# Patient Record
Sex: Male | Born: 1940 | Race: Black or African American | Hispanic: No | Marital: Married | Smoking: Never smoker
Health system: Southern US, Community
[De-identification: ages and names within clinical notes are randomized; demographics above are authoritative.]

## PROBLEM LIST (undated history)

## (undated) DIAGNOSIS — R634 Abnormal weight loss: Secondary | ICD-10-CM

## (undated) DIAGNOSIS — Z972 Presence of dental prosthetic device (complete) (partial): Secondary | ICD-10-CM

## (undated) DIAGNOSIS — E8809 Other disorders of plasma-protein metabolism, not elsewhere classified: Secondary | ICD-10-CM

## (undated) DIAGNOSIS — S37019A Minor contusion of unspecified kidney, initial encounter: Secondary | ICD-10-CM

## (undated) DIAGNOSIS — M1990 Unspecified osteoarthritis, unspecified site: Secondary | ICD-10-CM

## (undated) DIAGNOSIS — J9610 Chronic respiratory failure, unspecified whether with hypoxia or hypercapnia: Secondary | ICD-10-CM

## (undated) DIAGNOSIS — R55 Syncope and collapse: Secondary | ICD-10-CM

## (undated) DIAGNOSIS — I82409 Acute embolism and thrombosis of unspecified deep veins of unspecified lower extremity: Secondary | ICD-10-CM

## (undated) DIAGNOSIS — K6289 Other specified diseases of anus and rectum: Secondary | ICD-10-CM

## (undated) DIAGNOSIS — M179 Osteoarthritis of knee, unspecified: Secondary | ICD-10-CM

## (undated) DIAGNOSIS — E46 Unspecified protein-calorie malnutrition: Secondary | ICD-10-CM

## (undated) DIAGNOSIS — I10 Essential (primary) hypertension: Secondary | ICD-10-CM

## (undated) DIAGNOSIS — J8410 Pulmonary fibrosis, unspecified: Secondary | ICD-10-CM

## (undated) DIAGNOSIS — K219 Gastro-esophageal reflux disease without esophagitis: Secondary | ICD-10-CM

## (undated) DIAGNOSIS — D649 Anemia, unspecified: Secondary | ICD-10-CM

## (undated) DIAGNOSIS — Z9289 Personal history of other medical treatment: Secondary | ICD-10-CM

## (undated) DIAGNOSIS — N186 End stage renal disease: Secondary | ICD-10-CM

## (undated) DIAGNOSIS — I5042 Chronic combined systolic (congestive) and diastolic (congestive) heart failure: Secondary | ICD-10-CM

## (undated) DIAGNOSIS — M3500 Sicca syndrome, unspecified: Secondary | ICD-10-CM

## (undated) DIAGNOSIS — M2550 Pain in unspecified joint: Secondary | ICD-10-CM

## (undated) DIAGNOSIS — J189 Pneumonia, unspecified organism: Secondary | ICD-10-CM

## (undated) DIAGNOSIS — E162 Hypoglycemia, unspecified: Secondary | ICD-10-CM

## (undated) DIAGNOSIS — Z992 Dependence on renal dialysis: Secondary | ICD-10-CM

## (undated) DIAGNOSIS — R079 Chest pain, unspecified: Secondary | ICD-10-CM

## (undated) HISTORY — PX: OTHER SURGICAL HISTORY: SHX169

## (undated) HISTORY — DX: Other disorders of plasma-protein metabolism, not elsewhere classified: E88.09

## (undated) HISTORY — DX: Personal history of other medical treatment: Z92.89

## (undated) HISTORY — DX: Pain in unspecified joint: M25.50

## (undated) HISTORY — DX: Hypoglycemia, unspecified: E16.2

## (undated) HISTORY — DX: Essential (primary) hypertension: I10

## (undated) HISTORY — DX: Chest pain, unspecified: R07.9

## (undated) HISTORY — PX: MULTIPLE TOOTH EXTRACTIONS: SHX2053

## (undated) HISTORY — DX: Osteoarthritis of knee, unspecified: M17.9

## (undated) HISTORY — DX: Sicca syndrome, unspecified: M35.00

---

## 1997-09-13 HISTORY — PX: HEMORROIDECTOMY: SUR656

## 1998-01-09 ENCOUNTER — Other Ambulatory Visit: Admission: RE | Admit: 1998-01-09 | Discharge: 1998-01-09 | Payer: Self-pay | Admitting: General Surgery

## 2008-10-25 ENCOUNTER — Encounter: Admission: RE | Admit: 2008-10-25 | Discharge: 2008-10-25 | Payer: Self-pay | Admitting: Pulmonary Disease

## 2013-01-13 ENCOUNTER — Emergency Department (HOSPITAL_COMMUNITY)
Admission: EM | Admit: 2013-01-13 | Discharge: 2013-01-13 | Disposition: A | Discharge status: Home / Self Care | Payer: Medicare Other | Attending: Emergency Medicine | Admitting: Emergency Medicine

## 2013-01-13 DIAGNOSIS — H539 Unspecified visual disturbance: Secondary | ICD-10-CM | POA: Insufficient documentation

## 2013-01-13 DIAGNOSIS — S0510XA Contusion of eyeball and orbital tissues, unspecified eye, initial encounter: Secondary | ICD-10-CM | POA: Insufficient documentation

## 2013-01-13 DIAGNOSIS — Y9389 Activity, other specified: Secondary | ICD-10-CM | POA: Insufficient documentation

## 2013-01-13 DIAGNOSIS — H5789 Other specified disorders of eye and adnexa: Secondary | ICD-10-CM | POA: Insufficient documentation

## 2013-01-13 DIAGNOSIS — Y9289 Other specified places as the place of occurrence of the external cause: Secondary | ICD-10-CM | POA: Insufficient documentation

## 2013-01-13 DIAGNOSIS — IMO0002 Reserved for concepts with insufficient information to code with codable children: Secondary | ICD-10-CM | POA: Insufficient documentation

## 2013-01-13 DIAGNOSIS — S0591XA Unspecified injury of right eye and orbit, initial encounter: Secondary | ICD-10-CM

## 2013-01-13 MED ORDER — FLUORESCEIN SODIUM 1 MG OP STRP
1.0000 | ORAL_STRIP | Freq: Once | OPHTHALMIC | Status: AC
  Administered 2013-01-13: 1 via OPHTHALMIC
  Filled 2013-01-13: qty 1

## 2013-01-13 MED ORDER — PROPARACAINE HCL 0.5 % OP SOLN
1.0000 [drp] | Freq: Once | OPHTHALMIC | Status: AC
  Administered 2013-01-13: 1 [drp] via OPHTHALMIC
  Filled 2013-01-13: qty 15

## 2013-01-13 MED ORDER — FLUORESCEIN SODIUM 1 MG OP STRP
ORAL_STRIP | OPHTHALMIC | Status: AC
  Filled 2013-01-13: qty 2

## 2013-01-13 MED ORDER — TOBRAMYCIN 0.3 % OP SOLN
1.0000 [drp] | Freq: Once | OPHTHALMIC | Status: AC
  Administered 2013-01-13: 1 [drp] via OPHTHALMIC
  Filled 2013-01-13: qty 5

## 2013-01-13 NOTE — ED Provider Notes (Signed)
History    This chart was scribed for Carlisle Cater, a non-physician practitioner working with [name], MD by Joline Maxcy, ED Scribe. This patient was seen in room TR04C/TR04C and the patient's care was started at 1729.   CSN: [id number]  Arrival date & time 01/13/13  1713   First MD Initiated Contact with Patient 01/13/13 1729      Chief Complaint  Patient presents with  . Eye Problem    (Consider location/radiation/quality/duration/timing/severity/associated sxs/prior treatment) The history is provided by the patient and medical records. No language interpreter was used.    Travis [name] is a 72 y.o. male who presents to the Emergency Department complaining of a right eye injury that is neither alleviated or aggravated by anything and began at 12:30 when he was hit in the lid of the right eye with a rock while mowing the lawn. He denies pain, watering, or discharge of the eye as well as visual disturbances, but reports that he fell to his knees from the initial pain of the impact. He treated the injury at home with Refresh Optive Lubricant Eye Drop without relief. He states that he came to the ED when he noticed that his left pupil was not reacting to light. He reports that he only wears glasses at home when reading.  PCP is [employer].  No significant past medical history.   No significant past surgical history.   No family history on file.  History  Substance Use Topics  . Smoking status: Not on file  . Smokeless tobacco: Not on file  . Alcohol Use: Not on file      Review of Systems  Constitutional: Negative for fever.  HENT: Negative for sore throat and rhinorrhea.   Eyes: Positive for redness and visual disturbance. Negative for photophobia, pain and discharge.       Eye injury  Respiratory: Negative for cough.   Cardiovascular: Negative for chest pain.  Gastrointestinal: Negative for nausea, vomiting, abdominal pain and diarrhea.  Genitourinary:  Negative for dysuria.  Musculoskeletal: Negative for myalgias.  Skin: Negative for rash.  Neurological: Negative for headaches.    Allergies  Review of patient's allergies indicates not on file.  Home Medications   Current Outpatient Rx  Name  Route  Sig  Dispense  Refill  . aspirin 81 MG chewable tablet   Oral   Chew 162 mg by mouth once.           BP 121/67  Pulse 70  Temp(Src) 98.3 F (36.8 C) (Oral)  Resp 20  SpO2 96%  Physical Exam  Nursing note and vitals reviewed. Constitutional: He appears well-developed and well-nourished. No distress.  HENT:  Head: Normocephalic and atraumatic.  Eyes: EOM are normal. Right eye exhibits no discharge. Left eye exhibits no discharge. Scleral icterus is present. Right pupil is reactive. Left pupil is reactive. Pupils are unequal.  Slit lamp exam:      The right eye shows corneal abrasion. The right eye shows no hyphema and no fluorescein uptake.  Diffuse corneal abrasion across the diameter of the right cornea. Right pupil is 5 mm and not reactive. Left pupil is 3 mm and minimally reactive.  Neck: Normal range of motion. Neck supple. No tracheal deviation present.  Cardiovascular: Normal rate, regular rhythm and normal heart sounds.   Pulmonary/Chest: Effort normal and breath sounds normal. No respiratory distress.  Abdominal: Soft. He exhibits no distension. There is no tenderness.  Musculoskeletal: Normal range of motion. He  exhibits no edema.  Neurological: He is alert.  Skin: Skin is warm and dry.  Psychiatric: He has a normal mood and affect. His behavior is normal.    ED Course  Procedures (including critical care time)  DIAGNOSTIC STUDIES: Oxygen Saturation is 96% on room air, adequate by my interpretation.    COORDINATION OF CARE:  17:42- Discussed planned course of treatment with the patient, including a visual acuity screening, who is agreeable at this time.  17:56- Visual Acuity EB Visual Acuity - Bilateral  Near: 20/50 ; R Near: 20/100 ; L Near: 20/50  18:00- Medication Orders- proparacaine (alcaine) 0.5% ophthalmic solution 1 drop- once, fluorescein ophthalmic strip 1 strip- once.  Labs Reviewed - No data to display No results found.   1. Eye injury, right, initial encounter    Patient seen and examined. D/w and examined with Dr. Venora Maples.    Vital signs reviewed and are as follows: Filed Vitals:   01/13/13 1717  BP: 121/67  Pulse: 70  Temp: 98.3 F (36.8 C)  Resp: 20   I spoke with Dr. Baird Cancer of ophthalmology. Reccs tobrex and f/u on Monday. Occult globe laceration/rupture is unlikely given our exam here and sphincter paralysis can be explained by trauma.   Pt informed of discussion and need for follow-up.  He appears well at discharge.   Patient urged to return with worsening symptoms or other concerns. Patient verbalized understanding and agrees with plan.     MDM  Corneal abrasion. Possible early traumatic iritis -- however no pain. Doubt rupture. Consulted ophtho and f/u arranged. Likely paralysis 2/2 trauma. Vision deficit noted however patient feels his vision is at baseline.   I personally performed the services described in this documentation, which was scribed in my presence. The recorded information has been reviewed and is accurate.        Carlisle Cater, PA-C 01/14/13 0020

## 2013-01-13 NOTE — ED Notes (Signed)
Pt dc to home.  Pt sts understanding to dc instructions.  Pt ambulatory to exit without difficulty.

## 2013-01-13 NOTE — ED Notes (Signed)
Pt in c/o injury to right eye, states he was mowing and something hit him in the eye, redness noted, states he also noted at home that his right pupil did not react the same to light as his left and that prompted his visit. Pt also used some eye drops that his wife had in his right eye before he noted the pupil changes. Unsure what medication it was.

## 2013-01-15 NOTE — ED Provider Notes (Signed)
Medical screening examination/treatment/procedure(s) were conducted as a shared visit with non-physician practitioner(s) and myself.  I personally evaluated the patient during the encounter  Patient with large corneal abrasion across his right thigh.  He does also have a nonreactive pupil on the right side.  No Seidel sign is present.  Doubt open globe.  Patient has no pain in his right eye.  No pupil movement with consensual or direct light.  The case was discussed with the ophthalmologist who believes this is a transient paralyzed pupil secondary to trauma.  He agrees with treatment of the corneal abrasion with antibiotics.  He will see the patient followup. in 2 days  [name], MD 01/15/13 [phone]

## 2014-12-17 ENCOUNTER — Inpatient Hospital Stay (HOSPITAL_COMMUNITY)
Admission: EM | Admit: 2014-12-17 | Discharge: 2014-12-26 | DRG: 871 | Disposition: A | Discharge status: Home / Self Care | Payer: Medicare Other | Attending: Internal Medicine | Admitting: Internal Medicine

## 2014-12-17 ENCOUNTER — Other Ambulatory Visit: Payer: Self-pay | Admitting: Family Medicine

## 2014-12-17 ENCOUNTER — Encounter (HOSPITAL_COMMUNITY): Payer: Self-pay

## 2014-12-17 ENCOUNTER — Ambulatory Visit
Admission: RE | Admit: 2014-12-17 | Discharge: 2014-12-17 | Disposition: A | Discharge status: Home / Self Care | Payer: Medicare Other | Source: Ambulatory Visit | Attending: Family Medicine | Admitting: Family Medicine

## 2014-12-17 DIAGNOSIS — D649 Anemia, unspecified: Secondary | ICD-10-CM | POA: Diagnosis present

## 2014-12-17 DIAGNOSIS — R0602 Shortness of breath: Secondary | ICD-10-CM | POA: Diagnosis not present

## 2014-12-17 DIAGNOSIS — J8410 Pulmonary fibrosis, unspecified: Secondary | ICD-10-CM

## 2014-12-17 DIAGNOSIS — R601 Generalized edema: Secondary | ICD-10-CM | POA: Diagnosis present

## 2014-12-17 DIAGNOSIS — M7989 Other specified soft tissue disorders: Secondary | ICD-10-CM | POA: Diagnosis not present

## 2014-12-17 DIAGNOSIS — R509 Fever, unspecified: Secondary | ICD-10-CM | POA: Diagnosis present

## 2014-12-17 DIAGNOSIS — M2550 Pain in unspecified joint: Secondary | ICD-10-CM | POA: Diagnosis not present

## 2014-12-17 DIAGNOSIS — R05 Cough: Secondary | ICD-10-CM

## 2014-12-17 DIAGNOSIS — R600 Localized edema: Secondary | ICD-10-CM

## 2014-12-17 DIAGNOSIS — A419 Sepsis, unspecified organism: Principal | ICD-10-CM | POA: Diagnosis present

## 2014-12-17 DIAGNOSIS — M25512 Pain in left shoulder: Secondary | ICD-10-CM | POA: Diagnosis present

## 2014-12-17 DIAGNOSIS — E871 Hypo-osmolality and hyponatremia: Secondary | ICD-10-CM | POA: Diagnosis not present

## 2014-12-17 DIAGNOSIS — D509 Iron deficiency anemia, unspecified: Secondary | ICD-10-CM | POA: Diagnosis present

## 2014-12-17 DIAGNOSIS — E538 Deficiency of other specified B group vitamins: Secondary | ICD-10-CM | POA: Diagnosis present

## 2014-12-17 DIAGNOSIS — R059 Cough, unspecified: Secondary | ICD-10-CM

## 2014-12-17 DIAGNOSIS — R609 Edema, unspecified: Secondary | ICD-10-CM

## 2014-12-17 DIAGNOSIS — R52 Pain, unspecified: Secondary | ICD-10-CM

## 2014-12-17 DIAGNOSIS — M25561 Pain in right knee: Secondary | ICD-10-CM | POA: Diagnosis not present

## 2014-12-17 DIAGNOSIS — R030 Elevated blood-pressure reading, without diagnosis of hypertension: Secondary | ICD-10-CM

## 2014-12-17 DIAGNOSIS — IMO0001 Reserved for inherently not codable concepts without codable children: Secondary | ICD-10-CM | POA: Diagnosis present

## 2014-12-17 DIAGNOSIS — M069 Rheumatoid arthritis, unspecified: Secondary | ICD-10-CM | POA: Diagnosis present

## 2014-12-17 DIAGNOSIS — M170 Bilateral primary osteoarthritis of knee: Secondary | ICD-10-CM | POA: Diagnosis present

## 2014-12-17 DIAGNOSIS — M25519 Pain in unspecified shoulder: Secondary | ICD-10-CM | POA: Insufficient documentation

## 2014-12-17 DIAGNOSIS — R0600 Dyspnea, unspecified: Secondary | ICD-10-CM | POA: Diagnosis not present

## 2014-12-17 DIAGNOSIS — E8770 Fluid overload, unspecified: Secondary | ICD-10-CM | POA: Diagnosis present

## 2014-12-17 DIAGNOSIS — M791 Myalgia: Secondary | ICD-10-CM | POA: Diagnosis present

## 2014-12-17 DIAGNOSIS — J189 Pneumonia, unspecified organism: Secondary | ICD-10-CM | POA: Diagnosis present

## 2014-12-17 DIAGNOSIS — M25511 Pain in right shoulder: Secondary | ICD-10-CM | POA: Diagnosis present

## 2014-12-17 DIAGNOSIS — R0902 Hypoxemia: Secondary | ICD-10-CM | POA: Diagnosis present

## 2014-12-17 DIAGNOSIS — J90 Pleural effusion, not elsewhere classified: Secondary | ICD-10-CM | POA: Diagnosis not present

## 2014-12-17 DIAGNOSIS — R5381 Other malaise: Secondary | ICD-10-CM | POA: Diagnosis not present

## 2014-12-17 DIAGNOSIS — M25569 Pain in unspecified knee: Secondary | ICD-10-CM | POA: Insufficient documentation

## 2014-12-17 HISTORY — DX: Unspecified osteoarthritis, unspecified site: M19.90

## 2014-12-17 HISTORY — DX: Pneumonia, unspecified organism: J18.9

## 2014-12-17 HISTORY — DX: Gastro-esophageal reflux disease without esophagitis: K21.9

## 2014-12-17 LAB — CBC WITH DIFFERENTIAL/PLATELET
BASOS PCT: 0 % (ref 0–1)
Basophils Absolute: 0 10*3/uL (ref 0.0–0.1)
EOS PCT: 1 % (ref 0–5)
Eosinophils Absolute: 0.1 10*3/uL (ref 0.0–0.7)
HEMATOCRIT: 38.8 % — AB (ref 39.0–52.0)
HEMOGLOBIN: 12.7 g/dL — AB (ref 13.0–17.0)
LYMPHS ABS: 2.1 10*3/uL (ref 0.7–4.0)
LYMPHS PCT: 14 % (ref 12–46)
MCH: 29.8 pg (ref 26.0–34.0)
MCHC: 32.7 g/dL (ref 30.0–36.0)
MCV: 91.1 fL (ref 78.0–100.0)
MONOS PCT: 17 % — AB (ref 3–12)
Monocytes Absolute: 2.5 10*3/uL — ABNORMAL HIGH (ref 0.1–1.0)
NEUTROS ABS: 10.2 10*3/uL — AB (ref 1.7–7.7)
Neutrophils Relative %: 68 % (ref 43–77)
Platelets: 317 10*3/uL (ref 150–400)
RBC: 4.26 MIL/uL (ref 4.22–5.81)
RDW: 14 % (ref 11.5–15.5)
WBC: 14.9 10*3/uL — AB (ref 4.0–10.5)

## 2014-12-17 LAB — COMPREHENSIVE METABOLIC PANEL
ALBUMIN: 2.9 g/dL — AB (ref 3.5–5.2)
ALT: 11 U/L (ref 0–53)
ANION GAP: 8 (ref 5–15)
AST: 23 U/L (ref 0–37)
Alkaline Phosphatase: 66 U/L (ref 39–117)
BILIRUBIN TOTAL: 0.9 mg/dL (ref 0.3–1.2)
BUN: 9 mg/dL (ref 6–23)
CALCIUM: 8.7 mg/dL (ref 8.4–10.5)
CO2: 25 mmol/L (ref 19–32)
CREATININE: 0.97 mg/dL (ref 0.50–1.35)
Chloride: 101 mmol/L (ref 96–112)
GFR calc Af Amer: >90 mL/min (ref >90)
GFR calc non Af Amer: 80 mL/min — ABNORMAL LOW (ref >90)
GLUCOSE: 113 mg/dL — AB (ref 70–99)
Potassium: 4.6 mmol/L (ref 3.5–5.1)
Sodium: 134 mmol/L — ABNORMAL LOW (ref 135–145)
TOTAL PROTEIN: 7.3 g/dL (ref 6.0–8.3)

## 2014-12-17 LAB — I-STAT CG4 LACTIC ACID, ED: Lactic Acid, Venous: 1.35 mmol/L (ref 0.5–2.0)

## 2014-12-17 LAB — BRAIN NATRIURETIC PEPTIDE: B Natriuretic Peptide: 38.6 pg/mL (ref 0.0–100.0)

## 2014-12-17 MED ORDER — DEXTROSE 5 % IV SOLN
1.0000 g | Freq: Once | INTRAVENOUS | Status: AC
  Administered 2014-12-17: 1 g via INTRAVENOUS
  Filled 2014-12-17: qty 10

## 2014-12-17 MED ORDER — ACETAMINOPHEN 325 MG PO TABS
650.0000 mg | ORAL_TABLET | Freq: Four times a day (QID) | ORAL | Status: DC | PRN
  Administered 2014-12-17: 650 mg via ORAL
  Filled 2014-12-17: qty 2

## 2014-12-17 MED ORDER — AZITHROMYCIN 250 MG PO TABS
500.0000 mg | ORAL_TABLET | Freq: Once | ORAL | Status: AC
  Administered 2014-12-17: 500 mg via ORAL
  Filled 2014-12-17: qty 2

## 2014-12-17 NOTE — ED Provider Notes (Signed)
CSN: [id number]     Arrival date & time 12/17/14  1820 History   First MD Initiated Contact with Patient 12/17/14 2015     Chief Complaint  Patient presents with  . Cough  . Leg Swelling  . Shortness of Breath     (Consider location/radiation/quality/duration/timing/severity/associated sxs/prior Treatment) HPI Comments: Mr. Belmont is a 74 year old relatively healthy male.  He takes no medications on a chronic basis except one baby aspirin daily.  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 Today, presents to the emergency room with a five-week history of fatigue, joint pain, hand and lower leg swelling and cough for the past 5 weeks, he developed shortness of breath approximately one week ago.  He was seen at his primary care physician as well as the [place] he's been treated with Robitussin cough medication.  Loratadine 10 mg daily for nasal congestion without any resolution of his symptoms.  It was presumed by his primary care physician approximately 3 weeks ago that he had flu due to the fever and joint pain but did not receive a definitive test, nor was given prescription for Tamiflu. He was sent for chest x-ray by his primary care physician today which showed that he has a bibasilar pneumonia and sent to the emergency department for definitive treatment  Patient is a 74 y.o. male presenting with cough and shortness of breath. The history is provided by the patient.  Cough Cough characteristics:  Non-productive Severity:  Moderate Onset quality:  Gradual Duration:  5 weeks Timing:  Intermittent Progression:  Unchanged Chronicity:  New Smoker: no   Relieved by:  Nothing Worsened by:  Activity Ineffective treatments:  Cough suppressants Associated symptoms: fever, myalgias, shortness of breath and sore throat   Associated symptoms: no chest pain, no chills, no headaches, no rash, no rhinorrhea and no wheezing   Shortness of breath:    Severity:  Moderate    Onset quality:  Gradual   Duration:  5 weeks   Timing:  Constant   Progression:  Worsening Sore throat:    Severity:  Moderate   Onset quality:  Gradual   Duration:  5 weeks   Timing:  Intermittent   Progression:  Unchanged Shortness of Breath Associated symptoms: cough, fever and sore throat   Associated symptoms: no abdominal pain, no chest pain, no headaches, no rash, no vomiting and no wheezing     Past Medical History  Diagnosis Date  . Medical history non-contributory   . Arthritis   . Pneumonia   . GERD (gastroesophageal reflux disease)    Past Surgical History  Procedure Laterality Date  . Hemorroidectomy  1999  . Surgical procedure to remove a mole as a child Right Eye area    At around 43 years old   Family History  Problem Relation Age of Onset  . Hypertension Mother    History  Substance Use Topics  . Smoking status: Never Smoker   . Smokeless tobacco: Not on file  . Alcohol Use: No    Review of Systems  Constitutional: Positive for fever. Negative for chills.  HENT: Positive for sore throat. Negative for rhinorrhea and trouble swallowing.   Respiratory: Positive for cough and shortness of breath. Negative for wheezing and stridor.   Cardiovascular: Positive for leg swelling. Negative for chest pain and palpitations.  Gastrointestinal: Negative for nausea, vomiting and abdominal pain.  Genitourinary: Negative for dysuria.  Musculoskeletal: Positive for myalgias and joint swelling.  Skin: Negative  for rash and wound.  Neurological: Negative for headaches.  All other systems reviewed and are negative.     Allergies  Review of patient's allergies indicates no known allergies.  Home Medications   Prior to Admission medications   Medication Sig Start Date End Date Taking? Authorizing Provider  loratadine (CLARITIN) 10 MG tablet Take 10 mg by mouth daily.   Yes Historical Provider, MD   BP 138/65 mmHg  Pulse 96  Temp(Src) 99.1 F (37.3 C)  (Oral)  Resp 18  Ht 5\' 10"  (1.778 m)  Wt 229 lb (103.874 kg)  BMI 32.86 kg/m2  SpO2 97% Physical Exam  Constitutional: He is oriented to person, place, and time. He appears well-developed and well-nourished.  HENT:  Head: Normocephalic.  Right Ear: External ear normal.  Left Ear: External ear normal.  Mouth/Throat: Oropharynx is clear and moist.  Wording of tongue appears dry.  Patient states this is normal  Eyes: Pupils are equal, round, and reactive to light.  Neck: Normal range of motion.  Cardiovascular: Normal rate and regular rhythm.   Pulmonary/Chest: Effort normal and breath sounds normal. No respiratory distress. He has no wheezes. He has no rales. He exhibits no tenderness.  Abdominal: Soft. He exhibits no distension. There is no tenderness.  Musculoskeletal: He exhibits edema and tenderness.  Team of hands, left greater than right to just past the wrist bilaterally.  He also has edema to lower extremities to the knee  Lymphadenopathy:    He has no cervical adenopathy.  Neurological: He is alert and oriented to person, place, and time.  Skin: Skin is warm and dry.  Nursing note and vitals reviewed.   ED Course  Procedures (including critical care time) Labs Review Labs Reviewed  CBC WITH DIFFERENTIAL/PLATELET - Abnormal; Notable for the following:    WBC 14.9 (*)    Hemoglobin 12.7 (*)    HCT 38.8 (*)    Monocytes Relative 17 (*)    Neutro Abs 10.2 (*)    Monocytes Absolute 2.5 (*)    All other components within normal limits  COMPREHENSIVE METABOLIC PANEL - Abnormal; Notable for the following:    Sodium 134 (*)    Glucose, Bld 113 (*)    Albumin 2.9 (*)    GFR calc non Af Amer 80 (*)    All other components within normal limits  SEDIMENTATION RATE - Abnormal; Notable for the following:    Sed Rate 70 (*)    All other components within normal limits  IRON AND TIBC - Abnormal; Notable for the following:    Iron 14 (*)    TIBC 158 (*)    Saturation Ratios 9  (*)    All other components within normal limits  FERRITIN - Abnormal; Notable for the following:    Ferritin 332 (*)    All other components within normal limits  HEPATIC FUNCTION PANEL - Abnormal; Notable for the following:    Albumin 2.6 (*)    All other components within normal limits  CBC WITH DIFFERENTIAL/PLATELET - Abnormal; Notable for the following:    WBC 13.5 (*)    RBC 4.12 (*)    Hemoglobin 11.9 (*)    HCT 37.6 (*)    Monocytes Relative 17 (*)    Neutro Abs 9.2 (*)    Monocytes Absolute 2.3 (*)    All other components within normal limits  TSH - Abnormal; Notable for the following:    TSH 4.726 (*)    All other  components within normal limits  BASIC METABOLIC PANEL - Abnormal; Notable for the following:    Sodium 134 (*)    Glucose, Bld 106 (*)    GFR calc non Af Amer 80 (*)    All other components within normal limits  CBC - Abnormal; Notable for the following:    WBC 12.8 (*)    Hemoglobin 12.4 (*)    HCT 38.7 (*)    All other components within normal limits  CREATININE, SERUM - Abnormal; Notable for the following:    GFR calc non Af Amer 68 (*)    GFR calc Af Amer 78 (*)    All other components within normal limits  LACTATE DEHYDROGENASE - Abnormal; Notable for the following:    LDH 267 (*)    All other components within normal limits  CULTURE, BLOOD (ROUTINE X 2)  CULTURE, BLOOD (ROUTINE X 2)  CULTURE, EXPECTORATED SPUTUM-ASSESSMENT  GRAM STAIN  BRAIN NATRIURETIC PEPTIDE  URINALYSIS, ROUTINE W REFLEX MICROSCOPIC  PROTEIN / CREATININE RATIO, URINE  URIC ACID  VITAMIN B12  FOLATE  RETICULOCYTES  HIV ANTIBODY (ROUTINE TESTING)  STREP PNEUMONIAE URINARY ANTIGEN  INFLUENZA PANEL BY PCR (TYPE A & B, H1N1)  RHEUMATOID FACTOR  ANA, BODY FLUID  PROTEIN ELECTROPHORESIS, SERUM  LEGIONELLA ANTIGEN, URINE  PATHOLOGIST SMEAR REVIEW  COMPREHENSIVE METABOLIC PANEL  CBC  I-STAT CG4 LACTIC ACID, ED  Randolm Idol, ED    Imaging Review Dg Chest 2  View  12/17/2014   CLINICAL DATA:  Shortness of breath.  Flu-like syndrome 1 week ago.  EXAM: CHEST  2 VIEW  COMPARISON:  10/25/2008.  FINDINGS: Mediastinum hilar structures are normal. Borderline cardiomegaly. Bibasilar pulmonary infiltrates, left side greater than right. These findings are consistent with pneumonia. Questionable nodular density right mid lung. This may be related to pulmonary infiltrates. No pleural effusion or pneumothorax. No acute bony abnormality.  IMPRESSION: 1. Bibasilar pulmonary infiltrates, left side greater than right. These findings are consistent bibasilar pneumonia.  2. Questionable nodular density over the right mid lung. This most likely relates to pulmonary infiltrates. This can be followed on subsequent chest x-rays to demonstrate clearing.  3.  Borderline cardiomegaly.   Electronically Signed   By: Marcello Moores  Register   On: 12/17/2014 16:05     EKG Interpretation   Date/Time:  Tuesday December 17 2014 20:49:31 EDT Ventricular Rate:  88 PR Interval:  179 QRS Duration: 89 QT Interval:  353 QTC Calculation: 427 R Axis:   -14 Text Interpretation:  Sinus rhythm Baseline wander in lead(s) V1 V2  Otherwise within normal limits Confirmed by Betsey Holiday  MD, CHRISTOPHER  [phone]) on 12/17/2014 9:03:11 PM      MDM   Final diagnoses:  CAP (community acquired pneumonia)  Peripheral edema       Junius Creamer, NP 12/18/14 2056  Orpah Greek, MD 12/20/14 1224

## 2014-12-17 NOTE — H&P (Signed)
[facility] History and Physical  Travis NUSE [id number] DOB: [birth year] DOA: 12/17/2014  Referring physician: ER physician. PCP: PROVIDER NOT [place].  Chief Complaint: Fever chills and joint pains.  HPI: Travis [name] is a 74 y.o. male with no significant past medical history was referred to the ER after patient's chest x-ray was showing possible pneumonia. Patient has been having subjective feelings of fever and chills over the last 4-5 weeks. Patient had gone to urgent care center and was given symptomatic treatment despite which patient did not improve and patient went to his PCP. His PCP or her chest x-ray which showed pneumonic process and was referred to the ER. Patient states also last 2 days in addition patient has developed swelling of both his lower extremities hands and face with intense pain of his joints mostly the knees. Denies any chest pain. Has been having some shortness of breath occasionally with some nonproductive cough. Denies nausea vomiting abdominal pain dysuria discharges or diarrhea. Denies any recent travel or sick contacts.   Review of Systems: As presented in the history of presenting illness, rest negative.  Past Medical History  Diagnosis Date  . Medical history non-contributory    Past Surgical History  Procedure Laterality Date  . Hemorroidectomy  1999   Social History:  reports that he has never smoked. He does not have any smokeless tobacco history on file. He reports that he does not drink alcohol or use illicit drugs. Where does patient live home. Can patient participate in ADLs? Yes.  No Known Allergies  Family History:  Family History  Problem Relation Age of Onset  . Hypertension Mother       Prior to Admission medications   Medication Sig Start Date End Date Taking? Authorizing Provider  loratadine (CLARITIN) 10 MG tablet Take 10 mg by mouth daily.   Yes Historical Provider, MD    Physical  Exam: Filed Vitals:   12/17/14 2215 12/17/14 2230 12/17/14 2245 12/17/14 2300  BP: 152/75 157/82 153/69 149/99  Pulse: 89 92 92 99  Temp:      TempSrc:      Resp: 29 25 24 18   Height:      Weight:      SpO2: 95% 96% 95% 98%     General:  Well-developed and nourished.  Eyes: Anicteric no pallor.  ENT: No discharge from the ears eyes nose and mouth.  Neck: No mass felt.  Cardiovascular: S1 and S2 heard.  Respiratory: No rhonchi or crepitations.  Abdomen: Soft nontender bowel sounds present.  Skin: No rash.  Musculoskeletal: Patient has swelling of both his ankles knees and calf. Patient also has swelling of both his hands and mild edema of the face.  Psychiatric: Appears normal.  Neurologic: Alert awake oriented to time place and person. Moves all extremities.  Labs on Admission:  Basic Metabolic Panel:  Recent Labs Lab 12/17/14 1934  NA 134*  K 4.6  CL 101  CO2 25  GLUCOSE 113*  BUN 9  CREATININE 0.97  CALCIUM 8.7   Liver Function Tests:  Recent Labs Lab 12/17/14 1934  AST 23  ALT 11  ALKPHOS 66  BILITOT 0.9  PROT 7.3  ALBUMIN 2.9*   No results for input(s): LIPASE, AMYLASE in the last 168 hours. No results for input(s): AMMONIA in the last 168 hours. CBC:  Recent Labs Lab 12/17/14 1934  WBC 14.9*  NEUTROABS 10.2*  HGB 12.7*  HCT 38.8*  MCV 91.1  PLT 317   Cardiac Enzymes: No results for input(s): CKTOTAL, CKMB, CKMBINDEX, TROPONINI in the last 168 hours.  BNP (last 3 results)  Recent Labs  12/17/14 1934  BNP 38.6    ProBNP (last 3 results) No results for input(s): PROBNP in the last 8760 hours.  CBG: No results for input(s): GLUCAP in the last 168 hours.  Radiological Exams on Admission: Dg Chest 2 View  12/17/2014   CLINICAL DATA:  Shortness of breath.  Flu-like syndrome 1 week ago.  EXAM: CHEST  2 VIEW  COMPARISON:  10/25/2008.  FINDINGS: Mediastinum hilar structures are normal. Borderline cardiomegaly. Bibasilar  pulmonary infiltrates, left side greater than right. These findings are consistent with pneumonia. Questionable nodular density right mid lung. This may be related to pulmonary infiltrates. No pleural effusion or pneumothorax. No acute bony abnormality.  IMPRESSION: 1. Bibasilar pulmonary infiltrates, left side greater than right. These findings are consistent bibasilar pneumonia.  2. Questionable nodular density over the right mid lung. This most likely relates to pulmonary infiltrates. This can be followed on subsequent chest x-rays to demonstrate clearing.  3.  Borderline cardiomegaly.   Electronically Signed   By: Marcello Moores  Register   On: 12/17/2014 16:05    EKG: Independently reviewed. Normal sinus rhythm.  Assessment/Plan Principal Problem:   Anasarca Active Problems:   CAP (community acquired pneumonia)   Fever   Elevated blood pressure   Normocytic anemia   1. Pneumonia - at this time patient has been placed on ceftriaxone and Zithromax for community-acquired pneumonia. Check urine Legionella and strep antigen and influenza PCR. Check HIV status. 2. Anasarca/peripheral edema with severe joint pains - cause not clear. Check urinalysis to rule out any proteinuria specifically for nephrotic syndrome. This patient also is complaining of some shortness but I have ordered a 2-D echo. At this time I have ordered a sedimentation rate and uric acid levels ANA and rheumatoid factor. Based on which we have further plans. Check urine today and creatinine ratio. Patient does have low albumin levels. Patient's CBC also shows atypical cells for which we will get dyspnea study. 3. Elevated blood pressure  - for now we will closely follow blood pressure trends and I have placed patient on when necessary IV hydralazine.  4.  normocytic anemia - patient states he has had a colonoscopy 7 months ago which was unremarkable. Check anemia panel and check LDH for any hemolytic process. Closely follow CBC.   DVT  Prophylaxis Lovenox.  Code Status: Full code.  Family Communication: Family at the bedside.  Disposition Plan: Admit to inpatient.    Beren Yniguez N. [facility] Pager [phone].  If 7PM-7AM, please contact night-coverage [email or web address] Password [email or web address] 12/17/2014, 11:03 PM

## 2014-12-17 NOTE — ED Provider Notes (Signed)
Patient presented to the ER with coughpresent for 5 weeks. He has been seen by the [facility] and his local primary care doctor multiple times. Today he had an outpatient chest x-ray that suggested pneumonia. Patient reports severe myalgias and arthralgias diffusely. The last 3 days he has had swelling of his extremities.  Face to face Exam: HEENT - PERRLA Lungs - CTAB Heart - RRR, no M/R/G Abd - S/NT/ND Neuro - alert, oriented x3  Plan: Patient with chest x-ray consistent with pneumonia. He is febrile here in the ER. Symptoms have been ongoing for 5 weeks. He has now developed severe myalgias and arthralgias as well as diffuse swelling. No signs of congestive heart failure workup. Patient will require hospitalization for further management.   Orpah Greek, MD 12/17/14 2214

## 2014-12-17 NOTE — ED Notes (Signed)
Pt sent to ED by [facility] physicians for onset 1 week llower leg and hand swelling, joint pain, fatigue, cough x 5 weeks, pain to left groin x 1 week, shortness of breath.  Pt talking in complete sentences, NAD.  Influenza A 2 weeks ago.

## 2014-12-17 NOTE — ED Notes (Signed)
Gave pt a meal per Dr. Anson Fret.

## 2014-12-18 ENCOUNTER — Encounter (HOSPITAL_COMMUNITY): Payer: Self-pay | Admitting: Surgery

## 2014-12-18 DIAGNOSIS — R0602 Shortness of breath: Secondary | ICD-10-CM

## 2014-12-18 LAB — CBC
HCT: 38.7 % — ABNORMAL LOW (ref 39.0–52.0)
Hemoglobin: 12.4 g/dL — ABNORMAL LOW (ref 13.0–17.0)
MCH: 29.1 pg (ref 26.0–34.0)
MCHC: 32 g/dL (ref 30.0–36.0)
MCV: 90.8 fL (ref 78.0–100.0)
Platelets: 312 10*3/uL (ref 150–400)
RBC: 4.26 MIL/uL (ref 4.22–5.81)
RDW: 13.9 % (ref 11.5–15.5)
WBC: 12.8 10*3/uL — AB (ref 4.0–10.5)

## 2014-12-18 LAB — URINALYSIS, ROUTINE W REFLEX MICROSCOPIC
Bilirubin Urine: NEGATIVE
GLUCOSE, UA: NEGATIVE mg/dL
Hgb urine dipstick: NEGATIVE
KETONES UR: NEGATIVE mg/dL
LEUKOCYTES UA: NEGATIVE
NITRITE: NEGATIVE
PH: 5 (ref 5.0–8.0)
Protein, ur: NEGATIVE mg/dL
Specific Gravity, Urine: 1.019 (ref 1.005–1.030)
Urobilinogen, UA: 1 mg/dL (ref 0.0–1.0)

## 2014-12-18 LAB — HEPATIC FUNCTION PANEL
ALK PHOS: 62 U/L (ref 39–117)
ALT: 10 U/L (ref 0–53)
AST: 21 U/L (ref 0–37)
Albumin: 2.6 g/dL — ABNORMAL LOW (ref 3.5–5.2)
Total Bilirubin: 0.6 mg/dL (ref 0.3–1.2)
Total Protein: 6.7 g/dL (ref 6.0–8.3)

## 2014-12-18 LAB — BASIC METABOLIC PANEL
Anion gap: 7 (ref 5–15)
BUN: 9 mg/dL (ref 6–23)
CHLORIDE: 101 mmol/L (ref 96–112)
CO2: 26 mmol/L (ref 19–32)
CREATININE: 0.97 mg/dL (ref 0.50–1.35)
Calcium: 8.5 mg/dL (ref 8.4–10.5)
GFR calc non Af Amer: 80 mL/min — ABNORMAL LOW (ref >90)
Glucose, Bld: 106 mg/dL — ABNORMAL HIGH (ref 70–99)
Potassium: 4.2 mmol/L (ref 3.5–5.1)
SODIUM: 134 mmol/L — AB (ref 135–145)

## 2014-12-18 LAB — URIC ACID: Uric Acid, Serum: 5.6 mg/dL (ref 4.0–7.8)

## 2014-12-18 LAB — CBC WITH DIFFERENTIAL/PLATELET
BASOS PCT: 0 % (ref 0–1)
Basophils Absolute: 0 10*3/uL (ref 0.0–0.1)
EOS ABS: 0.1 10*3/uL (ref 0.0–0.7)
Eosinophils Relative: 1 % (ref 0–5)
HEMATOCRIT: 37.6 % — AB (ref 39.0–52.0)
Hemoglobin: 11.9 g/dL — ABNORMAL LOW (ref 13.0–17.0)
LYMPHS ABS: 1.9 10*3/uL (ref 0.7–4.0)
Lymphocytes Relative: 14 % (ref 12–46)
MCH: 28.9 pg (ref 26.0–34.0)
MCHC: 31.6 g/dL (ref 30.0–36.0)
MCV: 91.3 fL (ref 78.0–100.0)
MONO ABS: 2.3 10*3/uL — AB (ref 0.1–1.0)
Monocytes Relative: 17 % — ABNORMAL HIGH (ref 3–12)
NEUTROS ABS: 9.2 10*3/uL — AB (ref 1.7–7.7)
Neutrophils Relative %: 68 % (ref 43–77)
Platelets: 322 10*3/uL (ref 150–400)
RBC: 4.12 MIL/uL — ABNORMAL LOW (ref 4.22–5.81)
RDW: 13.9 % (ref 11.5–15.5)
WBC: 13.5 10*3/uL — ABNORMAL HIGH (ref 4.0–10.5)

## 2014-12-18 LAB — IRON AND TIBC
IRON: 14 ug/dL — AB (ref 42–165)
SATURATION RATIOS: 9 % — AB (ref 20–55)
TIBC: 158 ug/dL — AB (ref 215–435)
UIBC: 144 ug/dL (ref 125–400)

## 2014-12-18 LAB — RETICULOCYTES
RBC.: 4.26 MIL/uL (ref 4.22–5.81)
RETIC COUNT ABSOLUTE: 42.6 10*3/uL (ref 19.0–186.0)
RETIC CT PCT: 1 % (ref 0.4–3.1)

## 2014-12-18 LAB — LACTATE DEHYDROGENASE: LDH: 267 U/L — ABNORMAL HIGH (ref 94–250)

## 2014-12-18 LAB — INFLUENZA PANEL BY PCR (TYPE A & B)
H1N1FLUPCR: NOT DETECTED
Influenza A By PCR: NEGATIVE
Influenza B By PCR: NEGATIVE

## 2014-12-18 LAB — FOLATE: Folate: 8.5 ng/mL

## 2014-12-18 LAB — STREP PNEUMONIAE URINARY ANTIGEN: STREP PNEUMO URINARY ANTIGEN: NEGATIVE

## 2014-12-18 LAB — CREATININE, SERUM
CREATININE: 1.06 mg/dL (ref 0.50–1.35)
GFR calc non Af Amer: 68 mL/min — ABNORMAL LOW (ref >90)
GFR, EST AFRICAN AMERICAN: 78 mL/min — AB (ref >90)

## 2014-12-18 LAB — HIV ANTIBODY (ROUTINE TESTING W REFLEX): HIV Screen 4th Generation wRfx: NONREACTIVE

## 2014-12-18 LAB — SEDIMENTATION RATE: SED RATE: 70 mm/h — AB (ref 0–16)

## 2014-12-18 LAB — PROTEIN / CREATININE RATIO, URINE
CREATININE, URINE: 162.94 mg/dL
Protein Creatinine Ratio: 0.13 (ref 0.00–0.15)
Total Protein, Urine: 21 mg/dL

## 2014-12-18 LAB — FERRITIN: Ferritin: 332 ng/mL — ABNORMAL HIGH (ref 22–322)

## 2014-12-18 LAB — TSH: TSH: 4.726 u[IU]/mL — AB (ref 0.350–4.500)

## 2014-12-18 LAB — VITAMIN B12: VITAMIN B 12: 229 pg/mL (ref 211–911)

## 2014-12-18 MED ORDER — CEFTRIAXONE SODIUM IN DEXTROSE 20 MG/ML IV SOLN
1.0000 g | Freq: Every day | INTRAVENOUS | Status: DC
  Filled 2014-12-18: qty 50

## 2014-12-18 MED ORDER — FERROUS SULFATE 325 (65 FE) MG PO TABS
325.0000 mg | ORAL_TABLET | Freq: Two times a day (BID) | ORAL | Status: DC
  Administered 2014-12-19 – 2014-12-22 (×8): 325 mg via ORAL
  Filled 2014-12-18 (×9): qty 1

## 2014-12-18 MED ORDER — SODIUM CHLORIDE 0.9 % IJ SOLN
3.0000 mL | Freq: Two times a day (BID) | INTRAMUSCULAR | Status: DC
  Administered 2014-12-18 – 2014-12-26 (×18): 3 mL via INTRAVENOUS

## 2014-12-18 MED ORDER — ACETAMINOPHEN 650 MG RE SUPP: 650.0000 mg | Freq: Four times a day (QID) | RECTAL | Status: DC | PRN

## 2014-12-18 MED ORDER — DEXTROSE 5 % IV SOLN
500.0000 mg | Freq: Every day | INTRAVENOUS | Status: DC
  Administered 2014-12-19 – 2014-12-22 (×5): 500 mg via INTRAVENOUS
  Filled 2014-12-18 (×6): qty 500

## 2014-12-18 MED ORDER — MORPHINE SULFATE 2 MG/ML IJ SOLN
1.0000 mg | INTRAMUSCULAR | Status: DC | PRN
  Administered 2014-12-18 – 2014-12-25 (×9): 1 mg via INTRAVENOUS
  Filled 2014-12-18 (×9): qty 1

## 2014-12-18 MED ORDER — ENOXAPARIN SODIUM 40 MG/0.4ML ~~LOC~~ SOLN
40.0000 mg | Freq: Every day | SUBCUTANEOUS | Status: DC
  Administered 2014-12-18 – 2014-12-26 (×9): 40 mg via SUBCUTANEOUS
  Filled 2014-12-18 (×9): qty 0.4

## 2014-12-18 MED ORDER — DEXTROSE 5 % IV SOLN
500.0000 mg | Freq: Every day | INTRAVENOUS | Status: DC
  Filled 2014-12-18: qty 500

## 2014-12-18 MED ORDER — CEFTRIAXONE SODIUM IN DEXTROSE 20 MG/ML IV SOLN
1.0000 g | Freq: Every day | INTRAVENOUS | Status: AC
  Administered 2014-12-19 – 2014-12-24 (×7): 1 g via INTRAVENOUS
  Filled 2014-12-18 (×8): qty 50

## 2014-12-18 MED ORDER — FUROSEMIDE 10 MG/ML IJ SOLN
20.0000 mg | Freq: Every day | INTRAMUSCULAR | Status: DC
  Administered 2014-12-18 – 2014-12-26 (×9): 20 mg via INTRAVENOUS
  Filled 2014-12-18 (×9): qty 2

## 2014-12-18 MED ORDER — ONDANSETRON HCL 4 MG/2ML IJ SOLN: 4.0000 mg | Freq: Four times a day (QID) | INTRAMUSCULAR | Status: DC | PRN

## 2014-12-18 MED ORDER — ACETAMINOPHEN 325 MG PO TABS
650.0000 mg | ORAL_TABLET | Freq: Four times a day (QID) | ORAL | Status: DC | PRN
  Administered 2014-12-18 – 2014-12-23 (×6): 650 mg via ORAL
  Filled 2014-12-18 (×6): qty 2

## 2014-12-18 MED ORDER — ONDANSETRON HCL 4 MG PO TABS: 4.0000 mg | ORAL_TABLET | Freq: Four times a day (QID) | ORAL | Status: DC | PRN

## 2014-12-18 NOTE — Progress Notes (Signed)
Patient tranferred from ED via stretcher to room 3E05. Patient oriented to room and room equipment. Educated patient to the heart failure floor. CMT called and notified and confirmed telemetry placement. Will continue to monitor patient to end of shift.

## 2014-12-18 NOTE — Progress Notes (Signed)
Patient ID: Travis [name], male   DOB: 10/28/1940, 74 y.o.   MRN: [id number]  [name]  DALAN COWGER [id number]:[id number] DOB: 06/10/1941 DOA: 12/17/2014 PCP: PROVIDER NOT IN SYSTEM   Brief narrative:    74 y.o. male with no significant past medical history was referred to the ER after patient's chest x-ray was showing possible pneumonia. Patient has been having subjective feelings of fever and chills over the last 4-5 weeks. Patient had gone to urgent care center and was given symptomatic treatment despite which patient did not improve and patient went to his PCP. His PCP ordered chest x-ray which showed pneumonic process and was referred to the ER. Patient states also last 2 days in addition patient has developed swelling of both his lower extremities hands and face with intense pain of his joints mostly the knees. Denies any chest pain. Has been having some shortness of breath occasionally with some nonproductive cough. Denies nausea, vomiting, abdominal pain, dysuria, discharges, or diarrhea. Denies any recent travel or sick contacts.   In ED, pt noted to be hemodynamically stable with VS notable for T 100.9 F, HR 100, RR 29, BP 106/56. CXR worrisome for developing PNA. Blood work notable for WBC 14.9. TRH asked to admit to telemetry bed for further evaluation.   Assessment/Plan:    Principal Problem:   Sepsis - criteria met on admission with T 100.25F, HR 100, RR 29, WBC 14K - secondary to bilateral lung PNA, unknown pathogen at this time - continue to provide abx Zithromax and rocephin as started in ED - follow up on sputum cultures, urine legionella and strep penumo  - provide BD's as needed    Anasarca - unclear etiology, pt has gained over 20 lbs in the past several months - blood work including ANA, UPEP, SPEP, TSH, RF, sed rate, LDH, uric acid, anemia panel, pending  - will place on low dose lasix for now 20 mg IV QD and will monitor clinical response - UA  with no signs of protein in urine but albuminemia noted on blood work  - will follow up on blood work - 2 D ECHO also requested ruling out CHF, pulm HTN   CAP, with ? Nodular density RML - would ask for Ct chest for cleared evaluation, will place order for am as pt says he is too tired today  Active Problems:   Leukocytosis - unclear if secondary to CAP or another ? Autoimmune process contributing  - ABX as noted above, WBC still elevated  - will repeat CBC in AM   Normocytic anemia - anemia panel pending, LDH also pending  - pt reported having colonoscopy done 7 months ago and was unremarkable  - no signs of active bleeding for now but will need to monitor closely   DVT prophylaxis - Lovenox SQ  Code Status: Full.  Family Communication:  plan of care discussed with the patient and wife at bedside  Disposition Plan: Home when stable.   IV access:  Peripheral IV  Procedures and diagnostic studies:    Dg Chest 2 View  12/17/2014    Bibasilar pulmonary infiltrates, left side greater than right. These findings are consistent bibasilar pneumonia.  2. Questionable nodular density over the right mid lung. This most likely relates to pulmonary infiltrates. This can be followed on subsequent chest x-rays to demonstrate clearing.    Medical Consultants:  None   Other Consultants:  None   IAnti-Infectives:   Zithromax 4/5 --> Rocephin  4/5 -->  Faye Ramsay, MD  [facility] Pager [phone]  If 7PM-7AM, please contact night-coverage [email or web address] Password [facility] 12/18/2014, 5:34 PM   LOS: 1 day   HPI/Subjective: No events overnight.   Objective: Filed Vitals:   12/18/14 0045 12/18/14 0608 12/18/14 0930 12/18/14 1430  BP: 156/68 106/56 120/61 131/67  Pulse: 96 90 100 93  Temp: 98.1 F (36.7 C) 98.2 F (36.8 C) 98.8 F (37.1 C) 97.9 F (36.6 C)  TempSrc: Oral Tympanic Oral Oral  Resp: 16 16 20 20   Height:      Weight:      SpO2: 96% 98% 100% 95%    Intake/Output Summary  (Last 24 hours) at 12/18/14 1734 Last data filed at 12/18/14 1400  Gross per 24 hour  Intake    480 ml  Output   1400 ml  Net   -920 ml    Exam:   General:  Pt is alert, follows commands appropriately, not in acute distress  Cardiovascular: Regular rate and rhythm, S1/S2, no murmurs, no rubs, no gallops  Respiratory: Clear to auscultation bilaterally, rhonchi at bases with some crackles   Abdomen: Soft, non tender, non distended, bowel sounds present, no guarding  Extremities: bilateral upper and lower extremity edema, pulses DP and PT palpable bilaterally  Neuro: Grossly nonfocal  Data Reviewed: Basic Metabolic Panel:  Recent Labs Lab 12/17/14 1934 12/18/14 0038 12/18/14 0356  NA 134*  --  134*  K 4.6  --  4.2  CL 101  --  101  CO2 25  --  26  GLUCOSE 113*  --  106*  BUN 9  --  9  CREATININE 0.97 1.06 0.97  CALCIUM 8.7  --  8.5   Liver Function Tests:  Recent Labs Lab 12/17/14 1934 12/18/14 0356  AST 23 21  ALT 11 10  ALKPHOS 66 62  BILITOT 0.9 0.6  PROT 7.3 6.7  ALBUMIN 2.9* 2.6*   CBC:  Recent Labs Lab 12/17/14 1934 12/18/14 0038 12/18/14 0356  WBC 14.9* 12.8* 13.5*  NEUTROABS 10.2*  --  9.2*  HGB 12.7* 12.4* 11.9*  HCT 38.8* 38.7* 37.6*  MCV 91.1 90.8 91.3  PLT 317 312 322   Scheduled Meds: . azithromycin  500 mg Intravenous QHS  . cefTRIAXone (ROCEPHIN)  IV  1 g Intravenous QHS  . enoxaparin (LOVENOX) injection  40 mg Subcutaneous Daily  . furosemide  20 mg Intravenous Daily  . sodium chloride  3 mL Intravenous Q12H   Continuous Infusions:

## 2014-12-18 NOTE — Progress Notes (Signed)
*  PRELIMINARY RESULTS* Echocardiogram 2D Echocardiogram has been performed.  Leavy Cella 12/18/2014, 4:50 PM

## 2014-12-18 NOTE — Care Management Note (Signed)
    Page 1 of 2   12/26/2014     12:13:00 PM CARE MANAGEMENT NOTE 12/26/2014  Patient:  Travis [name], Travis [name]   Account Number:  [id number]  Date Initiated:  12/18/2014  Documentation initiated by:  Neamiah Sciarra  Subjective/Objective Assessment:   Pt adm on 12/17/14 with pneumonia, anasarca.  PTA, pt independent, resides at home with spouse.     Action/Plan:   Will follow for dc needs as pt progresses.   Anticipated DC Date:  12/25/2014   Anticipated DC Plan:  [place]  CM consult      [facility] Choice  HOME HEALTH   Choice offered to / List presented to:  C-1 Patient   DME arranged  [place]      DME agency  [place] arranged  [place].   Status of service:  Completed, signed off Medicare Important Message given?  YES (If response is "NO", the following Medicare IM given date fields will be blank) Date Medicare IM given:  12/20/2014 Medicare IM given by:  Kasean Denherder Date Additional Medicare IM given:  12/26/2014 Additional Medicare IM given by:  Ellan Lambert  Discharge Disposition:  [place]  Per UR Regulation:  Reviewed for med. necessity/level of care/duration of stay  If discussed at [place] of Stay Meetings, dates discussed:   12/26/2014    Comments:  12/26/14 Ellan Lambert, RN, BSN [phone] Pt for likely dc home today with spouse.  Needs HH follow up; pt/wife prefer [facility] for [facility] services.  Referral to [facility], per choice, start of care 24-48h post dc date.  Pt needs 3 in 1 and RW for home.  MD to put in orders for [facility] and DME.

## 2014-12-19 ENCOUNTER — Inpatient Hospital Stay (HOSPITAL_COMMUNITY): Payer: Medicare Other

## 2014-12-19 LAB — COMPREHENSIVE METABOLIC PANEL
ALK PHOS: 60 U/L (ref 39–117)
ALT: 10 U/L (ref 0–53)
AST: 24 U/L (ref 0–37)
Albumin: 2.5 g/dL — ABNORMAL LOW (ref 3.5–5.2)
Anion gap: 6 (ref 5–15)
BILIRUBIN TOTAL: 0.5 mg/dL (ref 0.3–1.2)
BUN: 10 mg/dL (ref 6–23)
CHLORIDE: 101 mmol/L (ref 96–112)
CO2: 26 mmol/L (ref 19–32)
CREATININE: 0.96 mg/dL (ref 0.50–1.35)
Calcium: 8.6 mg/dL (ref 8.4–10.5)
GFR calc Af Amer: >90 mL/min (ref >90)
GFR calc non Af Amer: 80 mL/min — ABNORMAL LOW (ref >90)
Glucose, Bld: 113 mg/dL — ABNORMAL HIGH (ref 70–99)
POTASSIUM: 4.2 mmol/L (ref 3.5–5.1)
Sodium: 133 mmol/L — ABNORMAL LOW (ref 135–145)
Total Protein: 6.9 g/dL (ref 6.0–8.3)

## 2014-12-19 LAB — PROTEIN ELECTROPHORESIS, SERUM
A/G Ratio: 0.7 (ref 0.7–2.0)
Albumin ELP: 2.8 g/dL — ABNORMAL LOW (ref 3.2–5.6)
Alpha-1-Globulin: 0.4 g/dL (ref 0.1–0.4)
Alpha-2-Globulin: 0.8 g/dL (ref 0.4–1.2)
Beta Globulin: 0.9 g/dL (ref 0.6–1.3)
Gamma Globulin: 1.9 g/dL — ABNORMAL HIGH (ref 0.5–1.6)
Globulin, Total: 3.9 g/dL (ref 2.0–4.5)
TOTAL PROTEIN ELP: 6.7 g/dL (ref 6.0–8.5)

## 2014-12-19 LAB — LEGIONELLA ANTIGEN, URINE

## 2014-12-19 LAB — CBC
HCT: 37.9 % — ABNORMAL LOW (ref 39.0–52.0)
Hemoglobin: 12 g/dL — ABNORMAL LOW (ref 13.0–17.0)
MCH: 28.6 pg (ref 26.0–34.0)
MCHC: 31.7 g/dL (ref 30.0–36.0)
MCV: 90.2 fL (ref 78.0–100.0)
Platelets: 327 10*3/uL (ref 150–400)
RBC: 4.2 MIL/uL — ABNORMAL LOW (ref 4.22–5.81)
RDW: 13.9 % (ref 11.5–15.5)
WBC: 14.3 10*3/uL — ABNORMAL HIGH (ref 4.0–10.5)

## 2014-12-19 LAB — RHEUMATOID FACTOR: RHEUMATOID FACTOR: 28.8 [IU]/mL — AB (ref 0.0–13.9)

## 2014-12-19 LAB — PATHOLOGIST SMEAR REVIEW

## 2014-12-19 MED ORDER — CYANOCOBALAMIN 500 MCG PO TABS
500.0000 ug | ORAL_TABLET | Freq: Every day | ORAL | Status: DC
  Administered 2014-12-19 – 2014-12-26 (×8): 500 ug via ORAL
  Filled 2014-12-19 (×8): qty 1

## 2014-12-19 NOTE — Progress Notes (Signed)
[name]  Travis [name] [id number] DOB: [birth year] DOA: 12/17/2014 PCP: PROVIDER NOT IN SYSTEM  Assessment/Plan: 1. Sepsis secondary to CAP: Admitted to telemetry. Started on IV antibiotics, his breathing is much better. Sputum cultures not done, . 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 Strep pneumo and urine legionella negative. Influenza pcr is negative.  HIV ANTIBODY is non reactive.  Continue to monitor.   Anasarca: Blood work including ANA, SPEP, UPEP is pending. Mildly elevated LDH, low B12 levels. Normal lactic acid.    Anemia: low normal B12 levels, and low iron levels.  Replete iron and b12 with supplements.   Abnormal TSH:  Free t4 and free t3 ordered and pending.  Leukocytosis: Possibly from infection. Repeat in am.   Code Status: full code.  Family Communication: family at bedside.  Disposition Plan: pending, possibly home after PT eva in am.    Consultants:  None.   Procedures: none  Antibiotics:  Rocephin and zithromax  HPI/Subjective: He reports feeling better. No nausea or vomiting. No chest pain. Reports occasional body aches.   Objective: Filed Vitals:   12/19/14 1410  BP: 128/60  Pulse: 98  Temp: 98.5 F (36.9 C)  Resp: 20    Intake/Output Summary (Last 24 hours) at 12/19/14 1628 Last data filed at 12/19/14 1402  Gross per 24 hour  Intake    893 ml  Output   1900 ml  Net  -1007 ml   Filed Weights   12/17/14 1910 12/19/14 0539  Weight: 103.874 kg (229 lb) 100.472 kg (221 lb 8 oz)    Exam:   General:  Alert afebrile comfortable  Cardiovascular: s1s2  Respiratory: ctab  Abdomen: soft non tender non distended bowel sounds heard  Musculoskeletal: no pedal edema.   Data Reviewed: Basic Metabolic Panel:  Recent Labs Lab 12/17/14 1934 12/18/14 0038 12/18/14 0356 12/19/14 0531  NA 134*  --  134* 133*  K 4.6  --  4.2 4.2  CL 101  --  101  101  CO2 25  --  26 26  GLUCOSE 113*  --  106* 113*  BUN 9  --  9 10  CREATININE 0.97 1.06 0.97 0.96  CALCIUM 8.7  --  8.5 8.6   Liver Function Tests:  Recent Labs Lab 12/17/14 1934 12/18/14 0356 12/19/14 0531  AST 23 21 24   ALT 11 10 10   ALKPHOS 66 62 60  BILITOT 0.9 0.6 0.5  PROT 7.3 6.7 6.9  ALBUMIN 2.9* 2.6* 2.5*   No results for input(s): LIPASE, AMYLASE in the last 168 hours. No results for input(s): AMMONIA in the last 168 hours. CBC:  Recent Labs Lab 12/17/14 1934 12/18/14 0038 12/18/14 0356 12/19/14 0531  WBC 14.9* 12.8* 13.5* 14.3*  NEUTROABS 10.2*  --  9.2*  --   HGB 12.7* 12.4* 11.9* 12.0*  HCT 38.8* 38.7* 37.6* 37.9*  MCV 91.1 90.8 91.3 90.2  PLT 317 312 322 327   Cardiac Enzymes: No results for input(s): CKTOTAL, CKMB, CKMBINDEX, TROPONINI in the last 168 hours. BNP (last 3 results)  Recent Labs  12/17/14 1934  BNP 38.6    ProBNP (last 3 results) No results for input(s): PROBNP in the last 8760 hours.  CBG: No results for input(s): GLUCAP in the last 168 hours.  Recent Results (from the past 240 hour(s))  Culture, blood (routine x 2)     Status: None (Preliminary result)   Collection Time: 12/17/14  7:34 PM  Result Value Ref Range Status   Specimen Description BLOOD ARM LEFT  Final   Special Requests BOTTLES DRAWN AEROBIC ONLY 4CC  Final   Culture   Final           BLOOD CULTURE RECEIVED NO GROWTH TO DATE CULTURE WILL BE HELD FOR 5 DAYS BEFORE ISSUING A FINAL NEGATIVE REPORT Performed at [facility]    Report Status PENDING  Incomplete  Culture, blood (routine x 2)     Status: None (Preliminary result)   Collection Time: 12/17/14  9:02 PM  Result Value Ref Range Status   Specimen Description BLOOD LEFT ARM  Final   Special Requests BOTTLES DRAWN AEROBIC AND ANAEROBIC 5CC  Final   Culture   Final           BLOOD CULTURE RECEIVED NO GROWTH TO DATE CULTURE WILL BE HELD FOR 5 DAYS BEFORE ISSUING A FINAL NEGATIVE  REPORT Performed at [facility]    Report Status PENDING  Incomplete     Studies: Ct Chest Wo Contrast  12/19/2014   CLINICAL DATA:  Persistent fever and chills for past 5 weeks.  EXAM: CT CHEST WITHOUT CONTRAST  TECHNIQUE: Multidetector CT imaging of the chest was performed following the standard protocol without IV contrast material administration.  COMPARISON:  December 17, 2014 chest radiograph  FINDINGS: There are small bilateral pleural effusions. There is patchy fibrotic change in the periphery of each lung base. There is patchy airspace consolidation in both lower lobes, primarily in the posterior segment on the left and in the superior and posterior segments of the right. There is also patchy airspace consolidation in the inferior aspect of the lateral segment right middle lobe. There is mild upper and lower lobe bronchiectatic change bilaterally, more pronounced in the lower lobes than the upper lobes.  There are multiple small mediastinal lymph nodes. There is a lymph node is present just to the right of the inferior carina measuring 2.0 x 1.2 cm. There is a lymph node to the left of the carina measuring 2.1 x 1.5 cm. Remaining lymph nodes are either subcentimeter or upper normal in size.  There is a small pericardial effusion. There is no thoracic aortic aneurysm. There are scattered foci of coronary artery calcification. Thyroid appears unremarkable.  In the visualized upper abdomen, no focal lesions are identified on this noncontrast enhanced study.  There are no blastic or lytic bone lesions.  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 There are small pleural effusions bilaterally as well as patchy areas of airspace consolidation. There is also a small pericardial effusion. The areas of airspace consolidation most likely represent pneumonia, although alveolar edema could present in  this manner. This finding coupled with a pleural effusions raises question of a degree of congestive heart failure, although the pleural effusions may be reactive secondary to bibasilar pneumonia.  There is a degree of bronchiectasis bilaterally.  Mild adenopathy is present of uncertain etiology.   Electronically Signed   By: Lowella Grip III M.D.   On: 12/19/2014 09:42    Scheduled Meds: . azithromycin  500 mg Intravenous QHS  . cefTRIAXone (ROCEPHIN)  IV  1 g Intravenous QHS  . enoxaparin (LOVENOX) injection  40 mg Subcutaneous Daily  . ferrous sulfate  325 mg Oral BID WC  . furosemide  20 mg Intravenous Daily  . sodium chloride  3 mL Intravenous Q12H  Continuous Infusions:   Principal Problem:   Anasarca Active Problems:   CAP (community acquired pneumonia)   Fever   Elevated blood pressure   Normocytic anemia    Time spent: 30 minutes.     [facility] Pager [phone]. If 7PM-7AM, please contact night-coverage at [email or web address], password [facility] 12/19/2014, 4:28 PM  LOS: 2 days

## 2014-12-19 NOTE — Progress Notes (Signed)
Patient has low grade fever of 100.3. Patient just received tylenol 650 mg per PRN order and therefore will hopefully help lower low grade fever as well. Patient currently resting. Will continue to monitor patient to end of shift

## 2014-12-19 NOTE — Progress Notes (Signed)
Per nursing orders, notified on call hospitalist of low grade temp of 100.3. Patient had on about 4-5 blankets and therefore about 3 of them were removed.  But due to the fact that patient was admitted with fever and is currently on antibiotics, notified hospitalist for informational purposes. Will continue to monitor patient to end of shift.

## 2014-12-20 ENCOUNTER — Inpatient Hospital Stay (HOSPITAL_COMMUNITY): Payer: Medicare Other

## 2014-12-20 LAB — T4, FREE: Free T4: 0.92 ng/dL (ref 0.80–1.80)

## 2014-12-20 LAB — CBC
HEMATOCRIT: 38.3 % — AB (ref 39.0–52.0)
Hemoglobin: 12.6 g/dL — ABNORMAL LOW (ref 13.0–17.0)
MCH: 29.6 pg (ref 26.0–34.0)
MCHC: 32.9 g/dL (ref 30.0–36.0)
MCV: 89.9 fL (ref 78.0–100.0)
Platelets: 361 10*3/uL (ref 150–400)
RBC: 4.26 MIL/uL (ref 4.22–5.81)
RDW: 14 % (ref 11.5–15.5)
WBC: 18.6 10*3/uL — ABNORMAL HIGH (ref 4.0–10.5)

## 2014-12-20 MED ORDER — TRAMADOL-ACETAMINOPHEN 37.5-325 MG PO TABS
1.0000 | ORAL_TABLET | ORAL | Status: DC | PRN
  Administered 2014-12-20 – 2014-12-25 (×14): 2 via ORAL
  Filled 2014-12-20 (×15): qty 2

## 2014-12-20 NOTE — Progress Notes (Signed)
[place] visited by relatives . Spouse in attendance

## 2014-12-20 NOTE — Evaluation (Signed)
Physical Therapy Evaluation Patient Details Name: Travis [name] MRN: [id number] DOB: Mar 27, 1941 Today's Date: 12/20/2014   History of Present Illness  74 year old male with no significant past medical history presents with fever and chills, he was found to have community acquired pneumonia and sepsis.   Clinical Impression  Pt admitted with above diagnosis. Pt currently with functional limitations due to the deficits listed below (see PT Problem List). Pt needed RW for support for safe ambulation.  Pt agrees to get RW for home and HHPT f/u.  Pt sats at 90% during walk on RA.  DOE 2/4.  Will follow acutely.   Pt will benefit from skilled PT to increase their independence and safety with mobility to allow discharge to the venue listed below.      Follow Up Recommendations Home health PT;Supervision/Assistance - 24 hour    Equipment Recommendations  Rolling walker with 5" wheels    Recommendations for Other Services       Precautions / Restrictions Precautions Precautions: Fall Restrictions Weight Bearing Restrictions: No      Mobility  Bed Mobility Overal bed mobility: Needs Assistance Bed Mobility: Supine to Sit     Supine to sit: Supervision        Transfers Overall transfer level: Needs assistance Equipment used: Rolling walker (2 wheeled) Transfers: Sit to/from Stand Sit to Stand: Min guard         General transfer comment: Pt requiring UE support as when he first stands up he is flexed a little and has trouble straigtening up which he says is new since he has had PNA.  Ambulation/Gait Ambulation/Gait assistance: Min guard;Min assist Ambulation Distance (Feet): 550 Feet Assistive device: Rolling walker (2 wheeled);None Gait Pattern/deviations: Trunk flexed;Shuffle;Step-through pattern;Decreased stride length   Gait velocity interpretation: Below normal speed for age/gender General Gait Details: Pt needing UE support when didn't have RW for support.  Got RW  and pt min guard to supervision with RW.  Much steadier and good safety awareness with RW.  Pt did not want to use RW however pt needs to for safety as his strength and postural stability is impaired.    Stairs            Wheelchair Mobility    Modified Rankin (Stroke Patients Only)       Balance Overall balance assessment: Needs assistance;History of Falls Sitting-balance support: No upper extremity supported;Feet supported Sitting balance-Leahy Scale: Fair     Standing balance support: During functional activity;Bilateral upper extremity supported Standing balance-Leahy Scale: Poor Standing balance comment: Requiring UE support for balance as he is unstable and does not stand upright without UE support.               High level balance activites: Direction changes;Turns High Level Balance Comments: min guard assist with challenges with RW.              Pertinent Vitals/Pain Pain Assessment: No/denies pain  Sats 90% and above on RA with ambulation.  DOE 2/4.      Home Living Family/patient expects to be discharged to:: Private residence Living Arrangements: Spouse/significant other Available Help at Discharge: Family;Available 24 hours/day Type of Home: House Home Access: Stairs to enter Entrance Stairs-Rails: None Entrance Stairs-Number of Steps: 6 Home Layout: One level Home Equipment: None      Prior Function Level of Independence: Independent         Comments: Pt retired 4 weeks ago.  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       Hand Dominance        Extremity/Trunk Assessment   Upper Extremity Assessment: Defer to OT evaluation           Lower Extremity Assessment: Overall WFL for tasks assessed      Cervical / Trunk Assessment: Kyphotic  Communication   Communication: No difficulties  Cognition Arousal/Alertness: Awake/alert Behavior During Therapy: WFL for tasks assessed/performed Overall Cognitive  Status: Within Functional Limits for tasks assessed                      General Comments      Exercises        Assessment/Plan    PT Assessment Patient needs continued PT services  PT Diagnosis Generalized weakness   PT Problem List Decreased balance;Decreased activity tolerance;Decreased mobility;Decreased knowledge of use of DME;Decreased safety awareness;Decreased knowledge of precautions;Cardiopulmonary status limiting activity  PT Treatment Interventions DME instruction;Gait training;Functional mobility training;Therapeutic activities;Therapeutic exercise;Balance training;Stair training;Patient/family education   PT Goals (Current goals can be found in the Care Plan section) Acute Rehab PT Goals Patient Stated Goal: to go home PT Goal Formulation: With patient Time For Goal Achievement: 12/27/14 Potential to Achieve Goals: Good    Frequency Min 3X/week   Barriers to discharge        Co-evaluation               End of Session Equipment Utilized During Treatment: Gait belt Activity Tolerance: Patient limited by fatigue Patient left: with call bell/phone within reach;in bed Nurse Communication: Mobility status         Time: 1202-1226 PT Time Calculation (min) (ACUTE ONLY): 24 min   Charges:   PT Evaluation $Initial PT Evaluation Tier I: 1 Procedure PT Treatments $Gait Training: 8-22 mins   PT G CodesDenice Paradise 16-Jan-2015, 4:21 PM [place] Rashidah [place],PT Acute Rehabilitation [phone] (pager)

## 2014-12-20 NOTE — Progress Notes (Signed)
[name]  Travis [name] [id number] DOB: 1940-09-27 DOA: 12/17/2014 PCP: PROVIDER NOT IN SYSTEM Interim summary: 74 year old male with no significant past medical history presents with fever and chills, he was found to have community acquired pneumonia and sepsis.  He is improving and denies any new complaints.  Assessment/Plan: Sepsis secondary to CAP: Admitted to telemetry. Started on IV antibiotics, his breathing is much better. His blood cultures have been negative so far.  Sputum cultures not done . 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 Strep pneumo and urine legionella negative. Influenza pcr is negative.  HIV ANTIBODY is non reactive.  Continue to monitor.   Anasarca: Blood work including ANA, SPEP, UPEP is pending. Mildly elevated LDH, low B12 levels. Normal lactic acid.    Anemia: low normal B12 levels, and low iron levels.  Replete iron and b12 with supplements.   Abnormal TSH:  Normal free t4.   Leukocytosis: Possibly from infection. Pending labs this am.   Left knee pain: X rays do not reveal any effusion, showed severe lateral femoro tibial osteoarthritis. Pain control and PT.   Mild hyponatremia: Hydration and repeat in am.    Code Status: full code.  Family Communication: family at bedside.  Disposition Plan: pending PT eval, possibly in am.    Consultants:  None.   Procedures: none  Antibiotics:  Rocephin and zithromax  HPI/Subjective: He reports feeling better. No nausea or vomiting. No chest pain. Reports left knee pain.  Objective: Filed Vitals:   12/20/14 1009  BP: 135/69  Pulse: 92  Temp:   Resp:     Intake/Output Summary (Last 24 hours) at 12/20/14 1338 Last data filed at 12/20/14 1034  Gross per 24 hour  Intake   1870 ml  Output   1425 ml  Net    445 ml   Filed Weights   12/17/14 1910 12/19/14 0539 12/20/14 0527  Weight: 103.874 kg (229 lb)  100.472 kg (221 lb 8 oz) 99.338 kg (219 lb)    Exam:   General:  Alert afebrile comfortable  Cardiovascular: s1s2  Respiratory: ctab, no wheezing or rhonchi.  Abdomen: soft non tender non distended bowel sounds heard  Musculoskeletal: no pedal edema.   Data Reviewed: Basic Metabolic Panel:  Recent Labs Lab 12/17/14 1934 12/18/14 0038 12/18/14 0356 12/19/14 0531  NA 134*  --  134* 133*  K 4.6  --  4.2 4.2  CL 101  --  101 101  CO2 25  --  26 26  GLUCOSE 113*  --  106* 113*  BUN 9  --  9 10  CREATININE 0.97 1.06 0.97 0.96  CALCIUM 8.7  --  8.5 8.6   Liver Function Tests:  Recent Labs Lab 12/17/14 1934 12/18/14 0356 12/19/14 0531  AST 23 21 24   ALT 11 10 10   ALKPHOS 66 62 60  BILITOT 0.9 0.6 0.5  PROT 7.3 6.7 6.9  ALBUMIN 2.9* 2.6* 2.5*   No results for input(s): LIPASE, AMYLASE in the last 168 hours. No results for input(s): AMMONIA in the last 168 hours. CBC:  Recent Labs Lab 12/17/14 1934 12/18/14 0038 12/18/14 0356 12/19/14 0531 12/20/14 1223  WBC 14.9* 12.8* 13.5* 14.3* PENDING  NEUTROABS 10.2*  --  9.2*  --   --   HGB 12.7* 12.4* 11.9* 12.0* 12.6*  HCT 38.8* 38.7* 37.6* 37.9* 38.3*  MCV 91.1 90.8 91.3 90.2 89.9  PLT 317 312 322 327 361  Cardiac Enzymes: No results for input(s): CKTOTAL, CKMB, CKMBINDEX, TROPONINI in the last 168 hours. BNP (last 3 results)  Recent Labs  12/17/14 1934  BNP 38.6    ProBNP (last 3 results) No results for input(s): PROBNP in the last 8760 hours.  CBG: No results for input(s): GLUCAP in the last 168 hours.  Recent Results (from the past 240 hour(s))  Culture, blood (routine x 2)     Status: None (Preliminary result)   Collection Time: 12/17/14  7:34 PM  Result Value Ref Range Status   Specimen Description BLOOD ARM LEFT  Final   Special Requests BOTTLES DRAWN AEROBIC ONLY 4CC  Final   Culture   Final           BLOOD CULTURE RECEIVED NO GROWTH TO DATE CULTURE WILL BE HELD FOR 5 DAYS BEFORE ISSUING  A FINAL NEGATIVE REPORT Performed at [facility]    Report Status PENDING  Incomplete  Culture, blood (routine x 2)     Status: None (Preliminary result)   Collection Time: 12/17/14  9:02 PM  Result Value Ref Range Status   Specimen Description BLOOD LEFT ARM  Final   Special Requests BOTTLES DRAWN AEROBIC AND ANAEROBIC 5CC  Final   Culture   Final           BLOOD CULTURE RECEIVED NO GROWTH TO DATE CULTURE WILL BE HELD FOR 5 DAYS BEFORE ISSUING A FINAL NEGATIVE REPORT Performed at [facility]    Report Status PENDING  Incomplete     Studies: Ct Chest Wo Contrast  12/19/2014   CLINICAL DATA:  Persistent fever and chills for past 5 weeks.  EXAM: CT CHEST WITHOUT CONTRAST  TECHNIQUE: Multidetector CT imaging of the chest was performed following the standard protocol without IV contrast material administration.  COMPARISON:  December 17, 2014 chest radiograph  FINDINGS: There are small bilateral pleural effusions. There is patchy fibrotic change in the periphery of each lung base. There is patchy airspace consolidation in both lower lobes, primarily in the posterior segment on the left and in the superior and posterior segments of the right. There is also patchy airspace consolidation in the inferior aspect of the lateral segment right middle lobe. There is mild upper and lower lobe bronchiectatic change bilaterally, more pronounced in the lower lobes than the upper lobes.  There are multiple small mediastinal lymph nodes. There is a lymph node is present just to the right of the inferior carina measuring 2.0 x 1.2 cm. There is a lymph node to the left of the carina measuring 2.1 x 1.5 cm. Remaining lymph nodes are either subcentimeter or upper normal in size.  There is a small pericardial effusion. There is no thoracic aortic aneurysm. There are scattered foci of coronary artery calcification. Thyroid appears unremarkable.  In the visualized upper abdomen, no focal lesions are identified  on this noncontrast enhanced study.  There are no blastic or lytic bone lesions.  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 There are small pleural effusions bilaterally as well as patchy areas of airspace consolidation. There is also a small pericardial effusion. The areas of airspace consolidation most likely represent pneumonia, although alveolar edema could present in this manner. This finding coupled with a pleural effusions raises question of a degree of congestive heart failure, although the pleural effusions may be reactive secondary to bibasilar pneumonia.  There is a degree of bronchiectasis  bilaterally.  Mild adenopathy is present of uncertain etiology.   Electronically Signed   By: Lowella Grip III M.D.   On: 12/19/2014 09:42   Dg Knee Complete 4 Views Left  12/20/2014   CLINICAL DATA:  Left lateral knee pain.  EXAM: LEFT KNEE - COMPLETE 4+ VIEW  COMPARISON:  None.  FINDINGS: No acute fracture or dislocation. Moderate-severe lateral femorotibial compartment osteoarthritis with severe joint space narrowing and marginal osteophytosis. No significant joint effusion.  IMPRESSION: Severe lateral femorotibial compartment osteoarthritis.   Electronically Signed   By: Kathreen Devoid   On: 12/20/2014 13:02    Scheduled Meds: . azithromycin  500 mg Intravenous QHS  . cefTRIAXone (ROCEPHIN)  IV  1 g Intravenous QHS  . vitamin B-12  500 mcg Oral Daily  . enoxaparin (LOVENOX) injection  40 mg Subcutaneous Daily  . ferrous sulfate  325 mg Oral BID WC  . furosemide  20 mg Intravenous Daily  . sodium chloride  3 mL Intravenous Q12H   Continuous Infusions:   Principal Problem:   Anasarca Active Problems:   CAP (community acquired pneumonia)   Fever   Elevated blood pressure   Normocytic anemia    Time spent: 30 minutes.     [facility] Pager [phone]. If  7PM-7AM, please contact night-coverage at [email or web address], password [facility] 12/20/2014, 1:38 PM  LOS: 3 days

## 2014-12-21 ENCOUNTER — Inpatient Hospital Stay (HOSPITAL_COMMUNITY): Payer: Medicare Other

## 2014-12-21 DIAGNOSIS — D509 Iron deficiency anemia, unspecified: Secondary | ICD-10-CM

## 2014-12-21 LAB — T3, FREE: T3 FREE: 2 pg/mL (ref 2.0–4.4)

## 2014-12-21 LAB — BASIC METABOLIC PANEL
Anion gap: 11 (ref 5–15)
BUN: 13 mg/dL (ref 6–23)
CHLORIDE: 97 mmol/L (ref 96–112)
CO2: 24 mmol/L (ref 19–32)
Calcium: 8.6 mg/dL (ref 8.4–10.5)
Creatinine, Ser: 0.97 mg/dL (ref 0.50–1.35)
GFR calc non Af Amer: 80 mL/min — ABNORMAL LOW (ref >90)
Glucose, Bld: 111 mg/dL — ABNORMAL HIGH (ref 70–99)
Potassium: 4 mmol/L (ref 3.5–5.1)
Sodium: 132 mmol/L — ABNORMAL LOW (ref 135–145)

## 2014-12-21 LAB — CK: Total CK: 88 U/L (ref 7–232)

## 2014-12-21 NOTE — Progress Notes (Addendum)
[name]  Travis [name] [id number] DOB: 09/27/1940 DOA: 12/17/2014 PCP: [name], MD   Brief narrative 74 year old male with history of osteoarthritis of the knees was referred to ED by his PCP after his chest x-ray showed possible pneumonia. Patient having subjective fevers with chills for the past 4-5 weeks which did not improve and his PCP order for chest x-ray. Since 2 days prior to admission he also noticed swelling on both hands and legs and face with pain involving his joints.( mainly the knees). Patient admitted and placed on empiric antibiotic. CT of the chest without contrast showed bilateral lower lobe peripheral interstitial fibrosis with small pleural effusions bilaterally and patchy airspace consolidation.    Assessment/Plan: Anasarca with ? Muscle and joint pains Differential includes polymyositis, dermatomyositis, interstitial pulmonary fibrosis Patient found to have generalized edema with pain in his joints (especially the knees), swelling of his hands and legs and pain in his proximal muscles (mainly shoulder and trapezius) . 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 Culture so far negative. HIV antibody negative. Blood work sent for ANA, SPEP. LDH elevated. Low normal B12 and mildly elevated TSH with normal T3 and T4. CK normal. -Elevated rheumatoid factor. Check ANA, ANCA, anti Jo- la ab, CCP, anti-Smith antibody and anti-ribonucleic acid antibody,UPEP , kappa and  lambda light chains. -Will need outpatient rheumatology referral. -2D echo with normal EF. - pulmonary consulted to evaluate underlying pulmonary fibrosis. Will see pt in am. Recommended repeat chest x-ray post diuresis.   Community acquired pneumonia Continue empiric Rocephin and azithromycin. (Day 4). Culture so far negative.  Generalized weakness PT eval recommends home health PT.  Low B12 level  added  supplement  Leucocytosis ? Secondary to CAP Follow in am.  Iron  deficiency anemia Added supplement.  DVT prophylaxis  Diet: regular    Code Status:full code Family Communication: daughter at bedside Disposition Plan: home once improved   Consultants:  none  Procedures:  CT chest  Antibiotics:  Rocephin and azithromycin 4/6--  HPI/Subjective: Patient seen and examined. Denies any shortness of breath. Leg swellings have improved. Still has weakness in his upper extremities.  Objective: Filed Vitals:   12/21/14 1418  BP: 123/61  Pulse: 82  Temp: 98.7 F (37.1 C)  Resp: 20    Intake/Output Summary (Last 24 hours) at 12/21/14 1459 Last data filed at 12/21/14 1418  Gross per 24 hour  Intake   1260 ml  Output    675 ml  Net    585 ml   Filed Weights   12/19/14 0539 12/20/14 0527 12/21/14 0833  Weight: 100.472 kg (221 lb 8 oz) 99.338 kg (219 lb) 100.2 kg (220 lb 14.4 oz)    Exam:   General:  Elderly male in no acute distress  HEENT: No pallor, moist oral mucosa, supple neck  Cardiovascular: S1 and S2, no murmurs rub or gallop  Respiratory: Fine bibasilar crackles, no wheezing or rhonchi  : Soft, nondistended, nontender, bowel sounds present  Musculoskeletal: Warm, coarse palms, weakness of the trapezius muscles, mild swelling oe hands and legs, no joint deformity or tenderness  CNS: Alert and oriented    Data Reviewed: Basic Metabolic Panel:  Recent Labs Lab 12/17/14 1934 12/18/14 0038 12/18/14 0356 12/19/14 0531 12/21/14 0347  NA 134*  --  134* 133* 132*  K 4.6  --  4.2 4.2 4.0  CL 101  --  101 101 97  CO2 25  --  26 26 24  GLUCOSE 113*  --  106* 113* 111*  BUN 9  --  9 10 13   CREATININE 0.97 1.06 0.97 0.96 0.97  CALCIUM 8.7  --  8.5 8.6 8.6   Liver Function Tests:  Recent Labs Lab 12/17/14 1934 12/18/14 0356 12/19/14 0531  AST 23 21 24   ALT 11 10 10   ALKPHOS 66 62 60  BILITOT 0.9 0.6 0.5  PROT 7.3 6.7 6.9  ALBUMIN  2.9* 2.6* 2.5*   No results for input(s): LIPASE, AMYLASE in the last 168 hours. No results for input(s): AMMONIA in the last 168 hours. CBC:  Recent Labs Lab 12/17/14 1934 12/18/14 0038 12/18/14 0356 12/19/14 0531 12/20/14 1223  WBC 14.9* 12.8* 13.5* 14.3* 18.6*  NEUTROABS 10.2*  --  9.2*  --   --   HGB 12.7* 12.4* 11.9* 12.0* 12.6*  HCT 38.8* 38.7* 37.6* 37.9* 38.3*  MCV 91.1 90.8 91.3 90.2 89.9  PLT 317 312 322 327 361   Cardiac Enzymes:  Recent Labs Lab 12/21/14 1120  CKTOTAL 88   BNP (last 3 results)  Recent Labs  12/17/14 1934  BNP 38.6    ProBNP (last 3 results) No results for input(s): PROBNP in the last 8760 hours.  CBG: No results for input(s): GLUCAP in the last 168 hours.  Recent Results (from the past 240 hour(s))  Culture, blood (routine x 2)     Status: None (Preliminary result)   Collection Time: 12/17/14  7:34 PM  Result Value Ref Range Status   Specimen Description BLOOD ARM LEFT  Final   Special Requests BOTTLES DRAWN AEROBIC ONLY 4CC  Final   Culture   Final           BLOOD CULTURE RECEIVED NO GROWTH TO DATE CULTURE WILL BE HELD FOR 5 DAYS BEFORE ISSUING A FINAL NEGATIVE REPORT Performed at [facility]    Report Status PENDING  Incomplete  Culture, blood (routine x 2)     Status: None (Preliminary result)   Collection Time: 12/17/14  9:02 PM  Result Value Ref Range Status   Specimen Description BLOOD LEFT ARM  Final   Special Requests BOTTLES DRAWN AEROBIC AND ANAEROBIC 5CC  Final   Culture   Final           BLOOD CULTURE RECEIVED NO GROWTH TO DATE CULTURE WILL BE HELD FOR 5 DAYS BEFORE ISSUING A FINAL NEGATIVE REPORT Performed at [facility]    Report Status PENDING  Incomplete     Studies: Dg Knee Complete 4 Views Left  12/20/2014   CLINICAL DATA:  Left lateral knee pain.  EXAM: LEFT KNEE - COMPLETE 4+ VIEW  COMPARISON:  None.  FINDINGS: No acute fracture or dislocation. Moderate-severe lateral femorotibial  compartment osteoarthritis with severe joint space narrowing and marginal osteophytosis. No significant joint effusion.  IMPRESSION: Severe lateral femorotibial compartment osteoarthritis.   Electronically Signed   By: Kathreen Devoid   On: 12/20/2014 13:02    Scheduled Meds: . azithromycin  500 mg Intravenous QHS  . cefTRIAXone (ROCEPHIN)  IV  1 g Intravenous QHS  . vitamin B-12  500 mcg Oral Daily  . enoxaparin (LOVENOX) injection  40 mg Subcutaneous Daily  . ferrous sulfate  325 mg Oral BID WC  . furosemide  20 mg Intravenous Daily  . sodium chloride  3 mL Intravenous Q12H   Continuous Infusions:     Time spent: 25 minutes    Louellen Molder  [facility] Pager [phone] If 7PM-7AM, please  contact night-coverage at [email or web address], password [facility] 12/21/2014, 2:59 PM  LOS: 4 days

## 2014-12-22 DIAGNOSIS — M7989 Other specified soft tissue disorders: Secondary | ICD-10-CM

## 2014-12-22 DIAGNOSIS — J8410 Pulmonary fibrosis, unspecified: Secondary | ICD-10-CM

## 2014-12-22 HISTORY — DX: Pulmonary fibrosis, unspecified: J84.10

## 2014-12-22 LAB — CBC
HEMATOCRIT: 35.8 % — AB (ref 39.0–52.0)
Hemoglobin: 11.5 g/dL — ABNORMAL LOW (ref 13.0–17.0)
MCH: 29 pg (ref 26.0–34.0)
MCHC: 32.1 g/dL (ref 30.0–36.0)
MCV: 90.2 fL (ref 78.0–100.0)
Platelets: 348 10*3/uL (ref 150–400)
RBC: 3.97 MIL/uL — ABNORMAL LOW (ref 4.22–5.81)
RDW: 14 % (ref 11.5–15.5)
WBC: 16.8 10*3/uL — ABNORMAL HIGH (ref 4.0–10.5)

## 2014-12-22 LAB — CYCLIC CITRUL PEPTIDE ANTIBODY, IGG/IGA: CCP Antibodies IgG/IgA: 7 units (ref 0–19)

## 2014-12-22 LAB — ALDOLASE: Aldolase: 9.7 U/L (ref 3.3–10.3)

## 2014-12-22 LAB — KAPPA/LAMBDA LIGHT CHAINS
Kappa free light chain: 86.99 mg/L — ABNORMAL HIGH (ref 3.30–19.40)
Kappa, lambda light chain ratio: 1.74 — ABNORMAL HIGH (ref 0.26–1.65)
Lambda free light chains: 50.02 mg/L — ABNORMAL HIGH (ref 5.71–26.30)

## 2014-12-22 MED ORDER — FERROUS SULFATE 325 (65 FE) MG PO TABS
325.0000 mg | ORAL_TABLET | Freq: Two times a day (BID) | ORAL | Status: DC
  Administered 2014-12-23 – 2014-12-26 (×7): 325 mg via ORAL
  Filled 2014-12-22 (×9): qty 1

## 2014-12-22 NOTE — Progress Notes (Signed)
[name]  Travis [name] [id number] DOB: [birth year] DOA: 12/17/2014 PCP: [name], MD   Brief narrative 74 year old male with history of osteoarthritis of the knees was referred to ED by his PCP after his chest x-ray showed possible pneumonia. Patient having subjective fevers with chills for the past 4-5 weeks which did not improve and his PCP order for chest x-ray. Since 2 days prior to admission he also noticed swelling on both hands and legs and face with pain involving his joints.( mainly the knees). Patient admitted and placed on empiric antibiotic. CT of the chest without contrast showed bilateral lower lobe peripheral interstitial fibrosis with small pleural effusions bilaterally and patchy airspace consolidation.    Assessment/Plan: Anasarca with ? Muscle and joint pains Differential includes polymyositis, dermatomyositis, interstitial pulmonary fibrosis Patient found to have generalized edema with pain in his joints (especially the knees), swelling of his hands and legs and pain in his proximal muscles (mainly shoulder and trapezius) . 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 Culture so far negative. HIV antibody negative.  LDH and Rh factor elevated. Low normal B12 and mildly elevated TSH with normal T3 and T4. CK normal. -Labs sent for Check ANA, ANCA, anti Jo- la ab, CCP, anti-Smith antibody and anti-ribonucleic acid antibody,UPEP , kappa and  lambda light chains. -Will need outpatient rheumatology referral. -2D echo with normal EF. - pulmonary consulted to evaluate underlying pulmonary fibrosis. Follow-up chest x-ray shows no pulmonary edema with hyperinflated lung and fibrotic changes in the lung bases.  Community acquired pneumonia Completes 5 day course of Rocephin and azithromycin today. Culture so far negative.  Generalized weakness PT eval recommends home health PT.  Low B12 level  added  supplement  Leucocytosis ? Secondary to CAP Follow in am.  Iron  deficiency anemia Added supplement.  DVT prophylaxis  Diet: regular    Code Status:full code Family Communication: wife at bedside Disposition Plan: pending lab results and pulm eval. home possibly tomorrow   Consultants:  none  Procedures:  CT chest  Antibiotics:  Rocephin and azithromycin 4/6--completes today  HPI/Subjective: Patient seen and examined. still has some swelling in his hands and legs and weakness in the shoulders.  Objective: Filed Vitals:   12/22/14 0606  BP: 120/59  Pulse: 88  Temp: 97.4 F (36.3 C)  Resp: 20    Intake/Output Summary (Last 24 hours) at 12/22/14 1259 Last data filed at 12/22/14 0850  Gross per 24 hour  Intake   1020 ml  Output    625 ml  Net    395 ml   Filed Weights   12/20/14 0527 12/21/14 0833 12/22/14 0606  Weight: 99.338 kg (219 lb) 100.2 kg (220 lb 14.4 oz) 100.8 kg (222 lb 3.6 oz)    Exam:   General:   no acute distress  HEENT: No pallor, moist oral mucosa, supple neck  Cardiovascular: S1 and S2, no murmurs rub or gallop  Respiratory: Fine bibasilar crackles, no wheezing or rhonchi  GI: Soft, nondistended, nontender, bowel sounds present  Musculoskeletal: Warm, coarse palms, weakness of the shoulders  swelling of hands and legs with trace pitting edema, no joint deformity or tenderness  CNS: Alert and oriented    Data Reviewed: Basic Metabolic Panel:  Recent Labs Lab 12/17/14 1934 12/18/14 0038 12/18/14 0356 12/19/14 0531 12/21/14 0347  NA 134*  --  134* 133* 132*  K 4.6  --  4.2 4.2 4.0  CL 101  --  101  101 97  CO2 25  --  26 26 24   GLUCOSE 113*  --  106* 113* 111*  BUN 9  --  9 10 13   CREATININE 0.97 1.06 0.97 0.96 0.97  CALCIUM 8.7  --  8.5 8.6 8.6   Liver Function Tests:  Recent Labs Lab 12/17/14 1934 12/18/14 0356 12/19/14 0531  AST 23 21 24   ALT 11 10 10   ALKPHOS 66 62 60  BILITOT 0.9 0.6 0.5  PROT 7.3  6.7 6.9  ALBUMIN 2.9* 2.6* 2.5*   No results for input(s): LIPASE, AMYLASE in the last 168 hours. No results for input(s): AMMONIA in the last 168 hours. CBC:  Recent Labs Lab 12/17/14 1934 12/18/14 0038 12/18/14 0356 12/19/14 0531 12/20/14 1223 12/22/14 0500  WBC 14.9* 12.8* 13.5* 14.3* 18.6* 16.8*  NEUTROABS 10.2*  --  9.2*  --   --   --   HGB 12.7* 12.4* 11.9* 12.0* 12.6* 11.5*  HCT 38.8* 38.7* 37.6* 37.9* 38.3* 35.8*  MCV 91.1 90.8 91.3 90.2 89.9 90.2  PLT 317 312 322 327 361 348   Cardiac Enzymes:  Recent Labs Lab 12/21/14 1120  CKTOTAL 88   BNP (last 3 results)  Recent Labs  12/17/14 1934  BNP 38.6    ProBNP (last 3 results) No results for input(s): PROBNP in the last 8760 hours.  CBG: No results for input(s): GLUCAP in the last 168 hours.  Recent Results (from the past 240 hour(s))  Culture, blood (routine x 2)     Status: None (Preliminary result)   Collection Time: 12/17/14  7:34 PM  Result Value Ref Range Status   Specimen Description BLOOD ARM LEFT  Final   Special Requests BOTTLES DRAWN AEROBIC ONLY 4CC  Final   Culture   Final           BLOOD CULTURE RECEIVED NO GROWTH TO DATE CULTURE WILL BE HELD FOR 5 DAYS BEFORE ISSUING A FINAL NEGATIVE REPORT Performed at [facility]    Report Status PENDING  Incomplete  Culture, blood (routine x 2)     Status: None (Preliminary result)   Collection Time: 12/17/14  9:02 PM  Result Value Ref Range Status   Specimen Description BLOOD LEFT ARM  Final   Special Requests BOTTLES DRAWN AEROBIC AND ANAEROBIC 5CC  Final   Culture   Final           BLOOD CULTURE RECEIVED NO GROWTH TO DATE CULTURE WILL BE HELD FOR 5 DAYS BEFORE ISSUING A FINAL NEGATIVE REPORT Performed at [facility]    Report Status PENDING  Incomplete     Studies: Dg Chest 2 View  12/21/2014   CLINICAL DATA:  Pneumonia  EXAM: CHEST  2 VIEW  COMPARISON:  12/19/2014  FINDINGS: Cardiomediastinal silhouette is stable.  Hyperinflation again noted. Again noted fibrotic changes lung bases. Streaky bilateral basilar atelectasis or infiltrate without change from prior exam. No pulmonary edema.  IMPRESSION: No pulmonary edema. Hyperinflation again noted. Again noted fibrotic changes lung bases. Persistent streaky bilateral basilar atelectasis or infiltrate.   Electronically Signed   By: Lahoma Crocker M.D.   On: 12/21/2014 19:47    Scheduled Meds: . azithromycin  500 mg Intravenous QHS  . cefTRIAXone (ROCEPHIN)  IV  1 g Intravenous QHS  . vitamin B-12  500 mcg Oral Daily  . enoxaparin (LOVENOX) injection  40 mg Subcutaneous Daily  . ferrous sulfate  325 mg Oral BID WC  . furosemide  20 mg Intravenous  Daily  . sodium chloride  3 mL Intravenous Q12H   Continuous Infusions:     Time spent: 25 minutes    Lachlyn Vanderstelt, [place]  [facility] Pager [phone] If 7PM-7AM, please contact night-coverage at [email or web address], password [facility] 12/22/2014, 12:59 PM  LOS: 5 days

## 2014-12-22 NOTE — Consult Note (Signed)
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 Pna and  PCCM requested to see am 4/10 for ? ILD  STUDIES:  CT chest s contrast 4/7 Evidence of bilateral lower lobe peripheral interstitial fibrosis. 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 There are small pleural effusions bilaterally as well as patchy areas of airspace consolidation. There is also a small pericardial effusion  SIGNIFICANT EVENTS:     HISTORY OF PRESENT ILLNESS:   74 y.o. male with no significant past medical history was referred to the ER after patient's chest x-ray was showing possible pneumonia. Patient has been having subjective feelings of fever and chills over the last 4-5 weeks. Patient had gone to urgent care center and was given symptomatic treatment despite which patient did not improve and patient went to his PCP> chest x-ray which showed pneumonic process and was referred to the ER. Patient states also last 2 days in addition patient has developed swelling of both his lower extremities hands and face with intense pain of his joints mostly the knees. Denies any chest pain. Has been having some shortness of breath occasionally with some nonproductive cough   No obvious day to day or daytime variabilty or assoc   cp or chest tightness, subjective wheeze overt sinus or hb symptoms. No unusual exp hx or h/o childhood pna/ asthma or knowledge of premature birth.  Sleeping ok without nocturnal  or early am exacerbation  of respiratory  c/o's or need for noct saba. Also denies any obvious fluctuation of symptoms  with weather or environmental changes or other aggravating or alleviating factors except as outlined above   Current Medications, Allergies, Complete Past Medical History, Past Surgical History, Family History, and Social History were reviewed in [employer] record.  ROS  The following are not active complaints unless bolded sore throat, dysphagia, dental problems, itching, sneezing,  nasal congestion or excess/ purulent secretions, ear ache,   fever, chills, sweats, unintended wt loss, pleuritic or exertional cp, hemoptysis,  orthopnea pnd or leg swelling, presyncope, palpitations, heartburn, abdominal pain, anorexia, nausea, vomiting, diarrhea  or change in bowel or urinary habits, change in stools or urine, dysuria,hematuria,  rash, arthralgias, visual complaints, headache, numbness weakness or ataxia or problems with walking or coordination,  change in mood/affect or memory.        PAST MEDICAL HISTORY :   has a past medical history of Medical history non-contributory; Arthritis; Pneumonia; and GERD (gastroesophageal reflux disease).  has past surgical history that includes Hemorroidectomy (1999) and Surgical procedure to remove a mole as a child (Right, Eye area). Prior to Admission medications   Medication Sig Start Date End Date Taking? Authorizing Provider  loratadine (CLARITIN) 10 MG tablet Take 10 mg by mouth daily.   Yes Historical Provider, MD   No Known Allergies  FAMILY HISTORY:  has no family status information on file.  SOCIAL HISTORY:  reports that he has never smoked. He does not have any smokeless tobacco history on file. He reports that he does not drink alcohol or use illicit drugs.  SUBJECTIVE:  Feeling much better since swelling went down/ comfortable at 30 deg HOB RA  VITAL SIGNS: Temp:  [97.4 F (36.3 C)-99.8 F (37.7 C)] 97.4 F (36.3 C) (04/10 0606) Pulse Rate:  [67-88] 88 (04/10 0606) Resp:  [20] 20 (04/10 0606) BP:  (120-143)/(59-72) 120/59 mmHg (04/10 0606) SpO2:  [93 %-95 %] 94 % (04/10 0606) Weight:  [222 lb 3.6 oz (100.8 kg)] 222 lb 3.6 oz (100.8 kg) (04/10 0606) HEMODYNAMICS:   VENTILATOR SETTINGS:   INTAKE / OUTPUT:  Intake/Output Summary (Last 24 hours) at 12/22/14 1357 Last data filed at 12/22/14 0850  Gross per 24 hour  Intake    780 ml  Output    625 ml  Net    155 ml    PHYSICAL EXAMINATION: General:   Pleasant bm nad Pt alert, approp nad @ 30 degrees No jvd Oropharynx clear Neck supple Lungs with a insp crackles bilaterally s cough on insp RRR no s3 or or sign murmur Abd mild obese with nl excursion   Extr wam with no edema or clubbing noted Neuro  No motor deficits   LABS:  CBC  Recent Labs Lab 12/19/14 0531 12/20/14 1223 12/22/14 0500  WBC 14.3* 18.6* 16.8*  HGB 12.0* 12.6* 11.5*  HCT 37.9* 38.3* 35.8*  PLT 327 361 348   Coag's No results for input(s): APTT, INR in the last 168 hours. BMET  Recent Labs Lab 12/18/14 0356 12/19/14 0531 12/21/14 0347  NA 134* 133* 132*  K 4.2 4.2 4.0  CL 101 101 97  CO2 26 26 24   BUN 9 10 13   CREATININE 0.97 0.96 0.97  GLUCOSE 106* 113* 111*   Electrolytes  Recent Labs Lab 12/18/14 0356 12/19/14 0531 12/21/14 0347  CALCIUM 8.5 8.6 8.6   Sepsis Markers  Recent Labs Lab 12/17/14 2011  LATICACIDVEN 1.35   ABG No results for input(s): PHART, PCO2ART, PO2ART in the last 168 hours. Liver Enzymes  Recent Labs Lab 12/17/14 1934 12/18/14 0356 12/19/14 0531  AST 23 21 24   ALT 11 10 10   ALKPHOS 66 62 60  BILITOT 0.9 0.6 0.5  ALBUMIN 2.9* 2.6* 2.5*   Cardiac Enzymes No results for input(s): TROPONINI, PROBNP in the last 168 hours. Glucose No results for input(s): GLUCAP in the last 168 hours.  Imaging Dg Chest 2 View  12/21/2014   CLINICAL DATA:  Pneumonia  EXAM: CHEST  2 VIEW  COMPARISON:  12/19/2014  FINDINGS: Cardiomediastinal silhouette is stable. Hyperinflation again noted. Again noted fibrotic  changes lung bases. Streaky bilateral basilar atelectasis or infiltrate without change from prior exam. No pulmonary edema.  IMPRESSION: No pulmonary edema. Hyperinflation again noted. Again noted fibrotic changes lung bases. Persistent streaky bilateral basilar atelectasis or infiltrate.   Electronically Signed   By: Lahoma Crocker M.D.   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  Only in  active  smokers Langerhan's Cell  Histiocyctosis (eosinophilic granuomatosis),  DIP,  and Respiratory Bronchiolitis ILD also need to be considered,  but don't apply here.   He is clearly better with diuresis related to anasarca from low alb but note neg u/a for protein so not clear why his alb is so low.   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

## 2014-12-23 ENCOUNTER — Inpatient Hospital Stay (HOSPITAL_COMMUNITY): Payer: Medicare Other

## 2014-12-23 DIAGNOSIS — M25511 Pain in right shoulder: Secondary | ICD-10-CM

## 2014-12-23 DIAGNOSIS — M25512 Pain in left shoulder: Secondary | ICD-10-CM

## 2014-12-23 DIAGNOSIS — R509 Fever, unspecified: Secondary | ICD-10-CM

## 2014-12-23 DIAGNOSIS — M25519 Pain in unspecified shoulder: Secondary | ICD-10-CM | POA: Insufficient documentation

## 2014-12-23 LAB — CBC
HEMATOCRIT: 37.2 % — AB (ref 39.0–52.0)
Hemoglobin: 11.9 g/dL — ABNORMAL LOW (ref 13.0–17.0)
MCH: 28.7 pg (ref 26.0–34.0)
MCHC: 32 g/dL (ref 30.0–36.0)
MCV: 89.6 fL (ref 78.0–100.0)
Platelets: 358 10*3/uL (ref 150–400)
RBC: 4.15 MIL/uL — ABNORMAL LOW (ref 4.22–5.81)
RDW: 13.9 % (ref 11.5–15.5)
WBC: 18 10*3/uL — ABNORMAL HIGH (ref 4.0–10.5)

## 2014-12-23 LAB — ANTI-SMITH ANTIBODY: ENA SM AB SER-ACNC: NEGATIVE

## 2014-12-23 LAB — SJOGRENS SYNDROME-A EXTRACTABLE NUCLEAR ANTIBODY: SSA (RO) (ENA) ANTIBODY, IGG: POSITIVE

## 2014-12-23 LAB — MPO/PR-3 (ANCA) ANTIBODIES: ANCA Proteinase 3: <3.5 U/mL (ref 0.0–3.5)

## 2014-12-23 LAB — SJOGRENS SYNDROME-B EXTRACTABLE NUCLEAR ANTIBODY: SSB (La) (ENA) Antibody, IgG: 1.6

## 2014-12-23 LAB — ANTI-RIBONUCLEIC ACID ANTIBODY: SM/RNP: NEGATIVE

## 2014-12-23 MED ORDER — PHENOL 1.4 % MT LIQD
1.0000 | OROMUCOSAL | Status: DC | PRN
Start: 2014-12-23 — End: 2014-12-26
  Filled 2014-12-23 (×2): qty 177

## 2014-12-23 MED ORDER — ENSURE ENLIVE PO LIQD
237.0000 mL | Freq: Two times a day (BID) | ORAL | Status: DC
  Administered 2014-12-24 – 2014-12-26 (×6): 237 mL via ORAL

## 2014-12-23 MED ORDER — AZITHROMYCIN 500 MG PO TABS
500.0000 mg | ORAL_TABLET | Freq: Every day | ORAL | Status: AC
  Administered 2014-12-23 – 2014-12-24 (×2): 500 mg via ORAL
  Filled 2014-12-23 (×2): qty 1

## 2014-12-23 MED ORDER — ADULT MULTIVITAMIN W/MINERALS CH
1.0000 | ORAL_TABLET | Freq: Every day | ORAL | Status: DC
  Administered 2014-12-23 – 2014-12-26 (×4): 1 via ORAL
  Filled 2014-12-23 (×4): qty 1

## 2014-12-23 MED ORDER — NYSTATIN 100000 UNIT/ML MT SUSP
5.0000 mL | Freq: Four times a day (QID) | OROMUCOSAL | Status: DC
  Administered 2014-12-23 – 2014-12-26 (×14): 500000 [IU] via ORAL
  Filled 2014-12-23 (×16): qty 5

## 2014-12-23 NOTE — Progress Notes (Signed)
INITIAL NUTRITION ASSESSMENT  DOCUMENTATION CODES Per approved criteria  -Not Applicable   INTERVENTION: Provide Ensure Enlive po BID, each supplement provides 350 kcal and 20 grams of protein Encourage PO intake   NUTRITION DIAGNOSIS: Inadequate oral intake related to poor appetite as evidenced by pt's report, <25% meal completion, and 7 lb weight loss   Goal: Pt to meet >/= 90% of their estimated nutrition needs   Monitor:  PO intake, weight trend, labs, I/O's  Reason for Assessment: Consult for assessment of nutrition status/requirements  74 y.o. male  Admitting Dx: Anasarca  ASSESSMENT: 74 y.o. male with no significant past medical history was referred to the ER after patient's chest x-ray was showing possible pneumonia. Patient has been having subjective feelings of fever and chills over the last 4-5 weeks.  Pt states that he was eating well PTA but, since admission he has been eating <25% compared to what he usually eats. He reports losing weight since admission from his usual body weight of 229 lbs. He denies any nausea, abdominal pain, constipation or diarrhea. Originally he wasn't eating well due to being on a heart healthy diet and food being too bland but, he is now on a regular diet and appetite remains poor. Per nursing notes pt has been eating 75-100% of most meals, 25-50% of some. No evidence of weight loss per weights in chart (12/19/14 -221 lbs). Pt likes Ensure and is agreeable to drinking it until his appetite improves. RD emphasized the importance of nutrition and encouraged PO intake.  Labs: low hemoglobin, low sodium   Nutrition Focused Physical Exam:  Subcutaneous Fat:  Orbital Region: mild wasting Upper Arm Region: wnl Thoracic and Lumbar Region: wnl  Muscle:  Temple Region: wnl Clavicle Bone Region: mild wasting Clavicle and Acromion Bone Region: wnl Scapular Bone Region: NA Dorsal Hand: wnl Patellar Region: wnl Anterior Thigh Region: mild  wasting Posterior Calf Region: wnl  Edema: +2 RLE and LLE edema   Height: Ht Readings from Last 1 Encounters:  12/17/14 5\' 10"  (1.778 m)    Weight: Wt Readings from Last 1 Encounters:  12/23/14 222 lb 3.6 oz (100.8 kg)  12/19/14 221 lb  Ideal Body Weight: 166 lbs  % Ideal Body Weight: 134%  Wt Readings from Last 10 Encounters:  12/23/14 222 lb 3.6 oz (100.8 kg)    Usual Body Weight: 229 lbs  % Usual Body Weight: 97%  BMI:  Body mass index is 31.89 kg/(m^2). (Obese)  Estimated Nutritional Needs: Kcal: 1800-2000 Protein: 90-100 grams Fluid: 2.7 L/day  Skin: +1 generalized edema; +1 RUE and LUE edema; +2 RLE and LLE edema  Diet Order: Diet regular Room service appropriate?: Yes; Fluid consistency:: Thin  EDUCATION NEEDS: -No education needs identified at this time   Intake/Output Summary (Last 24 hours) at 12/23/14 1319 Last data filed at 12/23/14 1043  Gross per 24 hour  Intake   1618 ml  Output   1950 ml  Net   -332 ml    Last BM: 4/10  Labs:   Recent Labs Lab 12/18/14 0356 12/19/14 0531 12/21/14 0347  NA 134* 133* 132*  K 4.2 4.2 4.0  CL 101 101 97  CO2 26 26 24   BUN 9 10 13   CREATININE 0.97 0.96 0.97  CALCIUM 8.5 8.6 8.6  GLUCOSE 106* 113* 111*    CBG (last 3)  No results for input(s): GLUCAP in the last 72 hours.  Scheduled Meds: . azithromycin  500 mg Oral QHS  . cefTRIAXone (  ROCEPHIN)  IV  1 g Intravenous QHS  . vitamin B-12  500 mcg Oral Daily  . enoxaparin (LOVENOX) injection  40 mg Subcutaneous Daily  . ferrous sulfate  325 mg Oral BID WC  . furosemide  20 mg Intravenous Daily  . nystatin  5 mL Oral QID  . sodium chloride  3 mL Intravenous Q12H    Continuous Infusions:   Past Medical History  Diagnosis Date  . Medical history non-contributory   . Arthritis   . Pneumonia   . GERD (gastroesophageal reflux disease)     Past Surgical History  Procedure Laterality Date  . Hemorroidectomy  1999  . Surgical procedure to  remove a mole as a child Right Eye area    At around 43 years old    Pryor Ochoa RD, LDN Inpatient Clinical Dietitian Pager: [phone] After Hours Pager: [phone]

## 2014-12-23 NOTE — Progress Notes (Signed)
Physical Therapy Treatment Patient Details Name: Travis [name] MRN: [id number] DOB: [birth year] Today's Date: 12/23/2014    History of Present Illness 74 year old male with no significant past medical history presents with fever and chills, he was found to have community acquired pneumonia and sepsis.     PT Comments    Pt admitted with above diagnosis. Pt currently with functional limitations due to balance and endurance deficits. Pt progressing with mobility not needing RW today however desat on RA.  May need home O2 and may still want to get a RW for home for days that pt isn't feeling as well.  Continue PT. Pt will benefit from skilled PT to increase their independence and safety with mobility to allow discharge to the venue listed below.    Follow Up Recommendations  Home health PT;Supervision/Assistance - 24 hour     Equipment Recommendations  Rolling walker with 5" wheels (still recommend RW for safety when pt has bad day)    Recommendations for Other Services       Precautions / Restrictions Precautions Precautions: Fall Restrictions Weight Bearing Restrictions: No    Mobility  Bed Mobility Overal bed mobility: Needs Assistance Bed Mobility: Supine to Sit     Supine to sit: Supervision        Transfers Overall transfer level: Needs assistance Equipment used: Rolling walker (2 wheeled) Transfers: Sit to/from Stand Sit to Stand: Supervision         General transfer comment: Pt stands by himself with good stability.    Ambulation/Gait Ambulation/Gait assistance: Min guard Ambulation Distance (Feet): 400 Feet Assistive device: None Gait Pattern/deviations: Step-through pattern;Decreased stride length;Trunk flexed   Gait velocity interpretation: Below normal speed for age/gender General Gait Details: Pt needing less support today not needing RW.  Good safety with ambulation without LOB without device.  Pt did desat however with ambulation.  Does better  with 2LO2.  Fatigues after walking 400 feet.  DOE 3/4.    Stairs Stairs: Yes Stairs assistance: Supervision Stair Management: One rail Right;Alternating pattern;Forwards Number of Stairs: 8 General stair comments: No Assist needed.  Pt with good safety.  DOE 3/4 after stairs.   Wheelchair Mobility    Modified Rankin (Stroke Patients Only)       Balance Overall balance assessment: Needs assistance;History of Falls Sitting-balance support: No upper extremity supported;Feet supported Sitting balance-Leahy Scale: Fair     Standing balance support: During functional activity;No upper extremity supported Standing balance-Leahy Scale: Fair Standing balance comment: can stand statically without device.  Can ambulate without device as well and maintain balance.               High level balance activites: Direction changes;Turns;Sudden stops High Level Balance Comments: supervision only     Cognition Arousal/Alertness: Awake/alert Behavior During Therapy: WFL for tasks assessed/performed Overall Cognitive Status: Within Functional Limits for tasks assessed                      Exercises      General Comments        Pertinent Vitals/Pain Pain Assessment: No/denies pain    SATURATION QUALIFICATIONS: (This note is used to comply with regulatory documentation for home oxygen)  Patient Saturations on Room Air at Rest = 87-91%  Patient Saturations on Room Air while Ambulating = 86-87%  Patient Saturations on 2 Liters of oxygen while Ambulating = 93-95%  Please briefly explain why patient needs home oxygen:Pt desats on RA at rest and  with activity.  May need home O2.   Home Living                      Prior Function            PT Goals (current goals can now be found in the care plan section) Progress towards PT goals: Progressing toward goals    Frequency  Min 3X/week    PT Plan Current plan remains appropriate    Co-evaluation              End of Session Equipment Utilized During Treatment: Gait belt;Oxygen Activity Tolerance: Patient limited by fatigue Patient left: in chair;Other (comment) (Xray came to get pt. )     Time: [id number] PT Time Calculation (min) (ACUTE ONLY): 23 min  Charges:  $Gait Training: 23-37 mins                    G Codes:      WhiteGodfrey Pick 01-05-15, 11:14 AM  Pollie Poma,PT Acute Rehabilitation [phone] (pager)

## 2014-12-23 NOTE — Progress Notes (Signed)
[name]  Travis [name] [id number] DOB: 01-07-1941 DOA: 12/17/2014 PCP: [name], MD   Brief narrative 74 year old male with history of osteoarthritis of the knees was referred to ED by his PCP after his chest x-ray showed possible pneumonia. Patient having subjective fevers with chills for the past 4-5 weeks which did not improve and his PCP order for chest x-ray. Since 2 days prior to admission he also noticed swelling on both hands and legs and face with pain involving his joints.( mainly the knees). Patient admitted and placed on empiric antibiotic. CT of the chest without contrast showed bilateral lower lobe peripheral interstitial fibrosis with small pleural effusions bilaterally and patchy airspace consolidation.    Assessment/Plan: Anasarca with ? Muscle and joint pains Differential includes , interstitial pulmonary fibrosis, ?MM, amyloidosis Patient found to have generalized edema with pain in his joints (especially the knees), swelling of his hands and legs and pain in his proximal muscles (mainly shoulder and trapezius) . 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 Culture so far negative. HIV antibody negative.  LDH and Rh factor elevated. Low normal B12 and mildly elevated TSH with normal T3 and T4. CK normal. -Labs sent for  ANA, ANCA, anti Jo- la ab, CCP, anti-Smith antibody and anti-ribonucleic acid antibody,UPEP , -elevated kappa and  lambda light chains. -Will need outpatient rheumatology referral. -2D echo with normal EF. - pulmonary consulted to evaluate underlying pulmonary fibrosis and ?ILD. Follow-up chest x-ray shows no pulmonary edema with hyperinflated lung and fibrotic changes in the lung bases. -Hematology consulted and Dr Jana Hakim will see pt.   Community acquired pneumonia Continue antibiotics. ( day 5).  having fever for past 24 hrs  Generalized weakness PT eval recommends home health  PT.  Low B12 level  added supplement  persistant Leucocytosis ? Secondary to CAP. Has not received steroids  B/l shoulder pain Will check x ray   Iron  deficiency anemia Added supplement.  DVT prophylaxis  Diet: regular    Code Status:full code Family Communication: wife at bedside Disposition Plan: Still having fever and workup pending. Home once stable   Consultants:  none  Procedures:  CT chest  Antibiotics:  Rocephin and azithromycin 4/6---  HPI/Subjective: Patient seen and examined. Still having pain in his shoulders. Having temperature spikes.  Objective: Filed Vitals:   12/23/14 1340  BP: 153/70  Pulse: 101  Temp: 100.6 F (38.1 C)  Resp: 20    Intake/Output Summary (Last 24 hours) at 12/23/14 1358 Last data filed at 12/23/14 1351  Gross per 24 hour  Intake   1918 ml  Output   2125 ml  Net   -207 ml   Filed Weights   12/21/14 0833 12/22/14 0606 12/23/14 0640  Weight: 100.2 kg (220 lb 14.4 oz) 100.8 kg (222 lb 3.6 oz) 100.8 kg (222 lb 3.6 oz)    Exam:   General:   no acute distress  HEENT: No pallor, moist oral mucosa, supple neck  Cardiovascular: S1 and S2, no murmurs rub or gallop  Respiratory: Fine bibasilar crackles, no wheezing or rhonchi  GI: Soft, nondistended, nontender, bowel sounds present  Musculoskeletal: Warm, coarse palms with swollen hands, weakness of the shoulders  swelling of hands and legs with trace pitting edema, no joint deformity or tenderness  CNS: Alert and oriented    Data Reviewed: Basic Metabolic Panel:  Recent Labs Lab 12/17/14 1934 12/18/14 0038 12/18/14 0356 12/19/14 0531 12/21/14 0347  NA 134*  --  134* 133* 132*  K 4.6  --  4.2 4.2 4.0  CL 101  --  101 101 97  CO2 25  --  26 26 24   GLUCOSE 113*  --  106* 113* 111*  BUN 9  --  9 10 13   CREATININE 0.97 1.06 0.97 0.96 0.97  CALCIUM 8.7  --  8.5 8.6 8.6   Liver Function Tests:  Recent Labs Lab 12/17/14 1934 12/18/14 0356  12/19/14 0531  AST 23 21 24   ALT 11 10 10   ALKPHOS 66 62 60  BILITOT 0.9 0.6 0.5  PROT 7.3 6.7 6.9  ALBUMIN 2.9* 2.6* 2.5*   No results for input(s): LIPASE, AMYLASE in the last 168 hours. No results for input(s): AMMONIA in the last 168 hours. CBC:  Recent Labs Lab 12/17/14 1934  12/18/14 0356 12/19/14 0531 12/20/14 1223 12/22/14 0500 12/23/14 0831  WBC 14.9*  < > 13.5* 14.3* 18.6* 16.8* 18.0*  NEUTROABS 10.2*  --  9.2*  --   --   --   --   HGB 12.7*  < > 11.9* 12.0* 12.6* 11.5* 11.9*  HCT 38.8*  < > 37.6* 37.9* 38.3* 35.8* 37.2*  MCV 91.1  < > 91.3 90.2 89.9 90.2 89.6  PLT 317  < > 322 327 361 348 358  < > = values in this interval not displayed. Cardiac Enzymes:  Recent Labs Lab 12/21/14 1120  CKTOTAL 88   BNP (last 3 results)  Recent Labs  12/17/14 1934  BNP 38.6    ProBNP (last 3 results) No results for input(s): PROBNP in the last 8760 hours.  CBG: No results for input(s): GLUCAP in the last 168 hours.  Recent Results (from the past 240 hour(s))  Culture, blood (routine x 2)     Status: None (Preliminary result)   Collection Time: 12/17/14  7:34 PM  Result Value Ref Range Status   Specimen Description BLOOD ARM LEFT  Final   Special Requests BOTTLES DRAWN AEROBIC ONLY 4CC  Final   Culture   Final           BLOOD CULTURE RECEIVED NO GROWTH TO DATE CULTURE WILL BE HELD FOR 5 DAYS BEFORE ISSUING A FINAL NEGATIVE REPORT Performed at [facility]    Report Status PENDING  Incomplete  Culture, blood (routine x 2)     Status: None (Preliminary result)   Collection Time: 12/17/14  9:02 PM  Result Value Ref Range Status   Specimen Description BLOOD LEFT ARM  Final   Special Requests BOTTLES DRAWN AEROBIC AND ANAEROBIC 5CC  Final   Culture   Final           BLOOD CULTURE RECEIVED NO GROWTH TO DATE CULTURE WILL BE HELD FOR 5 DAYS BEFORE ISSUING A FINAL NEGATIVE REPORT Performed at [facility]    Report Status PENDING  Incomplete      Studies: Dg Chest 2 View  12/21/2014   CLINICAL DATA:  Pneumonia  EXAM: CHEST  2 VIEW  COMPARISON:  12/19/2014  FINDINGS: Cardiomediastinal silhouette is stable. Hyperinflation again noted. Again noted fibrotic changes lung bases. Streaky bilateral basilar atelectasis or infiltrate without change from prior exam. No pulmonary edema.  IMPRESSION: No pulmonary edema. Hyperinflation again noted. Again noted fibrotic changes lung bases. Persistent streaky bilateral basilar atelectasis or infiltrate.   Electronically Signed   By: Lahoma Crocker M.D.   On: 12/21/2014 19:47   Dg Shoulder Right  12/23/2014   CLINICAL DATA:  Bilateral  shoulder pain and stiffness.  EXAM: RIGHT SHOULDER - 2+ VIEW  COMPARISON:  Two-view chest x-ray 12/21/2014  FINDINGS: There is no evidence of fracture or dislocation. There is no evidence of arthropathy or other focal bone abnormality. Soft tissues are unremarkable.  IMPRESSION: Negative right shoulder radiographs   Electronically Signed   By: San Morelle M.D.   On: 12/23/2014 12:42   Dg Shoulder Left  12/23/2014   CLINICAL DATA:  Bilateral shoulder pain/stiffness x1 week, no known injury  EXAM: LEFT SHOULDER - 2+ VIEW  COMPARISON:  None.  FINDINGS: No fracture or dislocation is seen.  The joint spaces are preserved.  Visualized soft tissues are within normal limits.  Visualized left lung is clear.  IMPRESSION: No acute osseus abnormality is seen.   Electronically Signed   By: Julian Hy M.D.   On: 12/23/2014 12:42    Scheduled Meds: . azithromycin  500 mg Oral QHS  . cefTRIAXone (ROCEPHIN)  IV  1 g Intravenous QHS  . vitamin B-12  500 mcg Oral Daily  . enoxaparin (LOVENOX) injection  40 mg Subcutaneous Daily  . ferrous sulfate  325 mg Oral BID WC  . furosemide  20 mg Intravenous Daily  . nystatin  5 mL Oral QID  . sodium chloride  3 mL Intravenous Q12H   Continuous Infusions:     Time spent: 25 minutes    Esbeidy Mclaine, [place]  [facility] Pager  [phone] If 7PM-7AM, please contact night-coverage at [email or web address], password [email or web address] 12/23/2014, 1:58 PM  LOS: 6 days

## 2014-12-23 NOTE — Progress Notes (Signed)
SATURATION QUALIFICATIONS: (This note is used to comply with regulatory documentation for home oxygen)  Patient Saturations on Room Air at Rest = 87-91%  Patient Saturations on Room Air while Ambulating = 86-87%  Patient Saturations on 2 Liters of oxygen while Ambulating = 93-95%  Please briefly explain why patient needs home oxygen:Pt desats on RA at rest and with activity.  May need home O2.  Thanks.  [place] [phone] (pager)

## 2014-12-23 NOTE — Progress Notes (Signed)
COURTESY NOTE:  Received consult request on this 75 y/o [place] man with CAP, possible rheumatic condition, abnormal protein values. SPEP shows no M-spike. There is a polyclonal increase in gamma globulins. Kappa and lambda are both elevated, and the ratio is only minimally up. This is most suggestive of a non clonal inflammatory or reactive process. Will wait for urine IFE results to complete full  consult but doubt myeloma or amyloidosis in this case.

## 2014-12-23 NOTE — Progress Notes (Signed)
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 Pna and  PCCM requested to see am 4/10 for ? ILD  STUDIES:  CT chest s contrast 4/7 Evidence of bilateral lower lobe peripheral interstitial fibrosis. 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 There are small pleural effusions bilaterally as well as patchy areas of airspace consolidation. There is also a small pericardial effusion  SIGNIFICANT EVENTS:     HISTORY OF PRESENT ILLNESS:   74 y.o. male with no significant past medical history was referred to the ER after patient's chest x-ray was showing possible pneumonia. Patient has been having subjective feelings of fever and chills over the last 4-5 weeks. Patient had gone to urgent care center and was given symptomatic treatment despite which patient did not improve and patient went to his PCP> chest x-ray which showed pneumonic process and was referred to the ER. Patient states also last 2 days in addition patient has developed swelling of both his lower extremities hands and face with intense pain of his joints mostly the knees. Denies any chest pain. Has been having some shortness of breath occasionally with some nonproductive cough   No obvious day to day or daytime variabilty or assoc   cp or chest tightness, subjective wheeze overt sinus or hb symptoms. No unusual exp hx or h/o childhood pna/ asthma or knowledge of premature birth.  Sleeping ok without nocturnal  or early am exacerbation  of respiratory  c/o's or need for noct saba. Also denies any obvious fluctuation of symptoms  with weather or environmental changes or other aggravating or alleviating factors except as outlined above   Current Medications, Allergies, Complete Past Medical History, Past Surgical History, Family History, and Social History were reviewed in [employer] record.  ROS  The following are not active complaints unless bolded sore throat, dysphagia, dental problems, itching, sneezing,  nasal congestion or excess/ purulent secretions, ear ache,   fever, chills, sweats, unintended wt loss, pleuritic or exertional cp, hemoptysis,  orthopnea pnd or leg swelling, presyncope, palpitations, heartburn, abdominal pain, anorexia, nausea, vomiting, diarrhea  or change in bowel or urinary habits, change in stools or urine, dysuria,hematuria,  rash, arthralgias, visual complaints, headache, numbness weakness or ataxia or problems with walking or coordination,  change in mood/affect or memory.        PAST MEDICAL HISTORY :   has a past medical history of Medical history non-contributory; Arthritis; Pneumonia; and GERD (gastroesophageal reflux disease).  has past surgical history that includes Hemorroidectomy (1999) and Surgical procedure to remove a mole as a child (Right, Eye area). Prior to Admission medications   Medication Sig Start Date End Date Taking? Authorizing Provider  loratadine (CLARITIN) 10 MG tablet Take 10 mg by mouth daily.   Yes Historical Provider, MD   No Known Allergies  FAMILY HISTORY:  has no family status information on file.  SOCIAL HISTORY:  reports that he has never smoked. He does not have any smokeless tobacco history on file. He reports that he does not drink alcohol or use illicit drugs.  SUBJECTIVE:  Feeling much better since swelling went down/ comfortable at 30 deg HOB RA  VITAL SIGNS: Temp:  [99.8 F (37.7 C)-100.7 F (38.2 C)] 100 F (37.8 C) (04/11 0640) Pulse Rate:  [93-108] 98 (04/11 0640) Resp:  [20] 20 (04/11 0640) BP:  (132-145)/(65-77) 132/65 mmHg (04/11 0640) SpO2:  [93 %-96 %] 95 % (04/11 0640) Weight:  [100.8 kg (222 lb 3.6 oz)] 100.8 kg (222 lb 3.6 oz) (04/11 0640) HEMODYNAMICS:   VENTILATOR SETTINGS:   INTAKE / OUTPUT:  Intake/Output Summary (Last 24 hours) at 12/23/14 1034 Last data filed at 12/23/14 0806  Gross per 24 hour  Intake   1618 ml  Output   1575 ml  Net     43 ml    PHYSICAL EXAMINATION: General:   Pleasant bm nad Pt alert, approp nad @ 30 degrees No jvd Oropharynx clear Neck supple Lungs with a insp crackles bilaterally s cough on insp RRR no s3 or or sign murmur Abd mild obese with nl excursion   Extr wam with no edema or clubbing noted Neuro  No motor deficits   LABS:  CBC  Recent Labs Lab 12/20/14 1223 12/22/14 0500 12/23/14 0831  WBC 18.6* 16.8* 18.0*  HGB 12.6* 11.5* 11.9*  HCT 38.3* 35.8* 37.2*  PLT 361 348 358   Coag's No results for input(s): APTT, INR in the last 168 hours. BMET  Recent Labs Lab 12/18/14 0356 12/19/14 0531 12/21/14 0347  NA 134* 133* 132*  K 4.2 4.2 4.0  CL 101 101 97  CO2 26 26 24   BUN 9 10 13   CREATININE 0.97 0.96 0.97  GLUCOSE 106* 113* 111*   Electrolytes  Recent Labs Lab 12/18/14 0356 12/19/14 0531 12/21/14 0347  CALCIUM 8.5 8.6 8.6   Sepsis Markers  Recent Labs Lab 12/17/14 2011  LATICACIDVEN 1.35   ABG No results for input(s): PHART, PCO2ART, PO2ART in the last 168 hours. Liver Enzymes  Recent Labs Lab 12/17/14 1934 12/18/14 0356 12/19/14 0531  AST 23 21 24   ALT 11 10 10   ALKPHOS 66 62 60  BILITOT 0.9 0.6 0.5  ALBUMIN 2.9* 2.6* 2.5*   Cardiac Enzymes No results for input(s): TROPONINI, PROBNP in the last 168 hours. Glucose No results for input(s): GLUCAP in the last 168 hours.  Imaging No results found.   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  Only in  active  smokers Langerhan's Cell  Histiocyctosis (eosinophilic granuomatosis),  DIP,  and Respiratory Bronchiolitis ILD also need to be considered,  but don't apply here.  He feels better with diureses.  CT findings are in the lower parts of the lungs only raising the question of dependent atelectasis.  He is back to room air with sats in the mid to high 90's.  The rheumatologic symptoms listed above are interesting however.  Rec - Would not recommend steroids at this time. - Treat as CAP as ordered. - Ambulatory desaturation study to see if O2 is needed with ambulation. - Will order ESR, CRP, ANA, RF and ACE levels for ILD work up. - Ambulate and get out of bed. - Continue diureses. - Needs work up as to why albumen is so low but will defer that to primary. - Will continue to follow with you.  Rush Farmer, M.D. [facility] Pulmonary/Critical Care Medicine. Pager: [phone]. After hours pager: [phone].

## 2014-12-24 DIAGNOSIS — M2550 Pain in unspecified joint: Secondary | ICD-10-CM

## 2014-12-24 DIAGNOSIS — J90 Pleural effusion, not elsewhere classified: Secondary | ICD-10-CM

## 2014-12-24 DIAGNOSIS — M7989 Other specified soft tissue disorders: Secondary | ICD-10-CM

## 2014-12-24 DIAGNOSIS — R0902 Hypoxemia: Secondary | ICD-10-CM

## 2014-12-24 LAB — UIFE/LIGHT CHAINS/TP QN, 24-HR UR
ALBUMIN, U: DETECTED
Alpha 1, Urine: DETECTED — AB
Alpha 2, Urine: DETECTED — AB
BETA UR: DETECTED — AB
Gamma Globulin, Urine: DETECTED — AB
TOTAL PROTEIN, URINE-UPE24: 19 mg/dL (ref 5–25)

## 2014-12-24 LAB — CREATININE, SERUM
Creatinine, Ser: 0.97 mg/dL (ref 0.50–1.35)
GFR calc Af Amer: >90 mL/min (ref >90)
GFR calc non Af Amer: 80 mL/min — ABNORMAL LOW (ref >90)

## 2014-12-24 LAB — CBC
HEMATOCRIT: 35.9 % — AB (ref 39.0–52.0)
Hemoglobin: 11.6 g/dL — ABNORMAL LOW (ref 13.0–17.0)
MCH: 28.7 pg (ref 26.0–34.0)
MCHC: 32.3 g/dL (ref 30.0–36.0)
MCV: 88.9 fL (ref 78.0–100.0)
Platelets: 375 10*3/uL (ref 150–400)
RBC: 4.04 MIL/uL — ABNORMAL LOW (ref 4.22–5.81)
RDW: 13.9 % (ref 11.5–15.5)
WBC: 19.9 10*3/uL — ABNORMAL HIGH (ref 4.0–10.5)

## 2014-12-24 LAB — CULTURE, BLOOD (ROUTINE X 2)
CULTURE: NO GROWTH
CULTURE: NO GROWTH

## 2014-12-24 LAB — GRAM STAIN

## 2014-12-24 LAB — BETA 2 MICROGLOBULIN, SERUM: Beta-2 Microglobulin: 2.6 mg/L — ABNORMAL HIGH (ref 0.6–2.4)

## 2014-12-24 LAB — JO-1 ANTIBODY-IGG: Jo-1 Antibody, IgG: <1

## 2014-12-24 LAB — ANGIOTENSIN CONVERTING ENZYME: ANGIOTENSIN-CONVERTING ENZYME: 37 U/L (ref 14–82)

## 2014-12-24 NOTE — Progress Notes (Addendum)
[name]  Travis [name] [id number] DOB: 11-May-1941 DOA: 12/17/2014 PCP: [name], MD   Brief narrative 74 year old male with history of osteoarthritis of the knees was referred to ED by his PCP after his chest x-ray showed possible pneumonia. Patient having subjective fevers with chills for the past 4-5 weeks which did not improve and his PCP order for chest x-ray. Since 2 days prior to admission he also noticed swelling on both hands and legs and face with pain involving his joints.( mainly the knees). Patient admitted and placed on empiric antibiotic. CT of the chest without contrast showed bilateral lower lobe peripheral interstitial fibrosis with small pleural effusions bilaterally and patchy airspace consolidation.    Assessment/Plan: Anasarca with ? Muscle and joint pains Differential includes , interstitial pulmonary fibrosis, ?MM, amyloidosis Patient found to have generalized edema with pain in his joints (especially the knees), swelling of his hands and legs, and pain in his proximal muscles (mainly shoulder and trapezius) . CT chest showing bilateral interstitial fibrosis and  lung base pneumonitis. Culture so far negative. HIV antibody negative.  LDH and Rh factor elevated. Low normal B12 and mildly elevated TSH with normal T3 and T4. CK normal. ACE level sent -ANA,  anti Jo- la ab, CCP, anti-Smith antibody and anti-ribonucleic acid antibody all negative. Urine IFE shows no monoclonal lights chains.  elevated kappa and  lambda light chains which could be related to inflammatory process. -Will need outpatient rheumatology referral. i have discussed this with pts PCP. -2D echo with normal EF. - pulmonary consulted to evaluate underlying pulmonary fibrosis and ?ILD. Follow-up chest x-ray shows no pulmonary edema with hyperinflated lung and fibrotic changes in the lung bases. ACE level sent. -Hematology consulted. Dr Jana Hakim will see pt later  today. -asked CCS for soft tissue biopsy (check for possible cutaneous amyloidosis)   Community acquired pneumonia On day 7 of abx . Can d/c in am  Generalized weakness PT eval recommends home health PT.  Low B12 level  added supplement  persistant Leucocytosis ? Secondary to CAP. Has not received steroids  B/l shoulder pain Will check x ray   Iron  deficiency anemia Added supplement.  DVT prophylaxis  Diet: regular    Code Status:full code Family Communication: wife at bedside Disposition Plan: home on 4/13 if afebrile with outpt PCP follow up pending consult by Dr Jana Hakim today.   Consultants:  Pulmonary   hematology ( Dr Jana Hakim)  Procedures:  CT chest  Antibiotics:  Rocephin and azithromycin 4/6--completes on 4/12  HPI/Subjective: Patient seen and examined.  Still febrile past 24 hrs. Pain better but still has hand and leg swelling  Objective: Filed Vitals:   12/24/14 1326  BP: 146/66  Pulse: 103  Temp: 98.2 F (36.8 C)  Resp: 20    Intake/Output Summary (Last 24 hours) at 12/24/14 1431 Last data filed at 12/24/14 1327  Gross per 24 hour  Intake    697 ml  Output   1450 ml  Net   -753 ml   Filed Weights   12/22/14 0606 12/23/14 0640 12/24/14 0505  Weight: 100.8 kg (222 lb 3.6 oz) 100.8 kg (222 lb 3.6 oz) 100.245 kg (221 lb)    Exam:   General:   Elderly male no acute distress  HEENT: No pallor, moist oral mucosa, supple neck, oral thrush improved  Cardiovascular: S1 and S2, no murmurs rub or gallop  Respiratory: Fine bibasilar crackles, no wheezing or rhonchi  GI: Soft, nondistended, nontender,  Musculoskeletal: Warm, coarse palms with swollen hands, weakness of the shoulders  swelling of hands and legs with trace pitting edema, no joint deformity or tenderness.  CNS: Alert and oriented.   Data Reviewed: Basic Metabolic Panel:  Recent Labs Lab 12/17/14 1934 12/18/14 0038 12/18/14 0356 12/19/14 0531  12/21/14 0347 12/24/14 0455  NA 134*  --  134* 133* 132*  --   K 4.6  --  4.2 4.2 4.0  --   CL 101  --  101 101 97  --   CO2 25  --  26 26 24   --   GLUCOSE 113*  --  106* 113* 111*  --   BUN 9  --  9 10 13   --   CREATININE 0.97 1.06 0.97 0.96 0.97 0.97  CALCIUM 8.7  --  8.5 8.6 8.6  --    Liver Function Tests:  Recent Labs Lab 12/17/14 1934 12/18/14 0356 12/19/14 0531  AST 23 21 24   ALT 11 10 10   ALKPHOS 66 62 60  BILITOT 0.9 0.6 0.5  PROT 7.3 6.7 6.9  ALBUMIN 2.9* 2.6* 2.5*   No results for input(s): LIPASE, AMYLASE in the last 168 hours. No results for input(s): AMMONIA in the last 168 hours. CBC:  Recent Labs Lab 12/17/14 1934  12/18/14 0356 12/19/14 0531 12/20/14 1223 12/22/14 0500 12/23/14 0831 12/24/14 0455  WBC 14.9*  < > 13.5* 14.3* 18.6* 16.8* 18.0* 19.9*  NEUTROABS 10.2*  --  9.2*  --   --   --   --   --   HGB 12.7*  < > 11.9* 12.0* 12.6* 11.5* 11.9* 11.6*  HCT 38.8*  < > 37.6* 37.9* 38.3* 35.8* 37.2* 35.9*  MCV 91.1  < > 91.3 90.2 89.9 90.2 89.6 88.9  PLT 317  < > 322 327 361 348 358 375  < > = values in this interval not displayed. Cardiac Enzymes:  Recent Labs Lab 12/21/14 1120  CKTOTAL 88   BNP (last 3 results)  Recent Labs  12/17/14 1934  BNP 38.6    ProBNP (last 3 results) No results for input(s): PROBNP in the last 8760 hours.  CBG: No results for input(s): GLUCAP in the last 168 hours.  Recent Results (from the past 240 hour(s))  Culture, blood (routine x 2)     Status: None   Collection Time: 12/17/14  7:34 PM  Result Value Ref Range Status   Specimen Description BLOOD ARM LEFT  Final   Special Requests BOTTLES DRAWN AEROBIC ONLY 4CC  Final   Culture   Final    NO GROWTH 5 DAYS Performed at [facility]    Report Status 12/24/2014 FINAL  Final  Culture, blood (routine x 2)     Status: None   Collection Time: 12/17/14  9:02 PM  Result Value Ref Range Status   Specimen Description BLOOD LEFT ARM  Final    Special Requests BOTTLES DRAWN AEROBIC AND ANAEROBIC 5CC  Final   Culture   Final    NO GROWTH 5 DAYS Performed at [facility]    Report Status 12/24/2014 FINAL  Final     Studies: Dg Shoulder Right  12/23/2014   CLINICAL DATA:  Bilateral shoulder pain and stiffness.  EXAM: RIGHT SHOULDER - 2+ VIEW  COMPARISON:  Two-view chest x-ray 12/21/2014  FINDINGS: There is no evidence of fracture or dislocation. There is no evidence of arthropathy or other focal bone abnormality. Soft tissues are unremarkable.  IMPRESSION: Negative  right shoulder radiographs   Electronically Signed   By: San Morelle M.D.   On: 12/23/2014 12:42   Dg Shoulder Left  12/23/2014   CLINICAL DATA:  Bilateral shoulder pain/stiffness x1 week, no known injury  EXAM: LEFT SHOULDER - 2+ VIEW  COMPARISON:  None.  FINDINGS: No fracture or dislocation is seen.  The joint spaces are preserved.  Visualized soft tissues are within normal limits.  Visualized left lung is clear.  IMPRESSION: No acute osseus abnormality is seen.   Electronically Signed   By: Julian Hy M.D.   On: 12/23/2014 12:42    Scheduled Meds: . azithromycin  500 mg Oral QHS  . cefTRIAXone (ROCEPHIN)  IV  1 g Intravenous QHS  . vitamin B-12  500 mcg Oral Daily  . enoxaparin (LOVENOX) injection  40 mg Subcutaneous Daily  . feeding supplement (ENSURE ENLIVE)  237 mL Oral BID BM  . ferrous sulfate  325 mg Oral BID WC  . furosemide  20 mg Intravenous Daily  . multivitamin with minerals  1 tablet Oral Daily  . nystatin  5 mL Oral QID  . sodium chloride  3 mL Intravenous Q12H   Continuous Infusions:     Time spent: 25 minutes    Cybil [place], [place]  [facility] Pager [phone] If 7PM-7AM, please contact night-coverage at [email or web address], password [facility] 12/24/2014, 2:31 PM  LOS: 7 days

## 2014-12-24 NOTE — Consult Note (Signed)
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 He presented to his PCP with these compaints as well as a cough and CXR 12/17/2014 suggested bilateral lower lobe pneumonia. The patient was then admitted, cultured, stared on antibiotics and further evaluated with a chest CT, echo, bilateral LE dopplers, and multiple labs which are detailed in the summary below but which briefly document a diffuse inflammatory condition involving many organs but primarily joints and lungs. We were consulted because of a high globulin fraction and high kappa and lambda light chain levels. However SPEP, UPEP and urine IFE show these to be polyclonal-- part of the inflammatory process, not its cause or explanation.  INTERVAL HISTORY: I met with Travis [name], his sister in law and his daughter ion his hospital room 12/24/2014 REVIEW OF SYSTEMS:  PAST MEDICAL HISTORY: Past Medical History  Diagnosis Date  . Medical history non-contributory   . Arthritis   . Pneumonia   . GERD (gastroesophageal reflux disease)     PAST SURGICAL HISTORY: Past Surgical History  Procedure Laterality Date  . Hemorroidectomy  1999  . Surgical procedure to remove a mole as a child Right Eye area    At around 80 years old    FAMILY HISTORY Family History  Problem Relation Age of Onset  . 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 At home it's just him and his wife Burman Nieves, who is in good health. He has a son, Vincente Liberty, who lives in [place], and a daughter, [name], who lives in [place]. He has 2 grandchildren. He is a Psychologist, forensic     HEALTH MAINTENANCE: History  Substance Use Topics  . Smoking status: Never Smoker   . Smokeless tobacco: Not on file  . Alcohol Use: No    No Known Allergies  Current Facility-Administered Medications  Medication Dose Route Frequency Provider Last Rate Last Dose  . acetaminophen (TYLENOL) tablet 650 mg  650 mg Oral Q6H PRN Rise Patience, MD   650 mg at 12/23/14 1347   Or  . acetaminophen (TYLENOL) suppository 650 mg  650 mg Rectal Q6H PRN Rise Patience, MD      . azithromycin (ZITHROMAX) tablet 500 mg  500 mg Oral QHS Nishant Dhungel, MD   500 mg at 12/23/14 2200  . cefTRIAXone (ROCEPHIN) 1 g in dextrose 5 % 50 mL IVPB - Premix  1 g Intravenous QHS Theodis Blaze, MD   1 g at 12/23/14 2203  . cyanocobalamin tablet 500 mcg  500 mcg Oral Daily Hosie Poisson, MD   500 mcg at 12/24/14 1017  . enoxaparin (LOVENOX) injection 40 mg  40 mg Subcutaneous Daily Rise Patience, MD   40 mg at 12/24/14 1016  . feeding supplement (ENSURE ENLIVE) (ENSURE ENLIVE) liquid 237 mL  237 mL Oral BID BM Baird Lyons, RD   237 mL at 12/24/14 1437  . ferrous sulfate tablet 325 mg  325 mg Oral BID WC Nishant Dhungel, MD   325 mg at 12/24/14 1016  . furosemide (LASIX) injection 20 mg  20 mg Intravenous Daily Theodis Blaze, MD   20 mg at 12/24/14 1017  . morphine 2 MG/ML injection 1 mg  1 mg Intravenous Q3H PRN Rise Patience, MD   1 mg at 12/22/14 2226  . multivitamin with minerals tablet 1 tablet  1 tablet Oral Daily Baird Lyons, RD   1 tablet at 12/24/14 1017  . nystatin (MYCOSTATIN) 100000 UNIT/ML suspension 500,000 Units  5 mL Oral QID Nishant Dhungel, MD   500,000 Units at 12/24/14 1437  . ondansetron (ZOFRAN) tablet 4 mg  4 mg Oral Q6H PRN Rise Patience, MD       Or  . ondansetron [facility]) injection 4 mg  4 mg Intravenous Q6H PRN Rise Patience, MD      . phenol (CHLORASEPTIC) mouth spray 1 spray  1 spray Mouth/Throat PRN Rush Farmer, MD      . sodium chloride 0.9 % injection 3 mL  3 mL Intravenous Q12H Rise Patience, MD   3 mL at 12/24/14 1000  . traMADol-acetaminophen (ULTRACET) 37.5-325 MG per tablet 1-2 tablet  1-2 tablet Oral Q4H PRN Hosie Poisson, MD   2 tablet at 12/23/14 2159    OBJECTIVE: older African American man examined in bed Filed Vitals:   12/24/14 1326  BP: 146/66  Pulse: 103  Temp: 98.2 F (36.8 C)  Resp: 20     Body mass index is 31.71 kg/(m^2).    ECOG FS:3 - Symptomatic, >50% confined to bed  Ocular: Sclerae unicteric, EOMs intact Ear-nose-throat: Oropharynx clear and moist Lymphatic: No palpable cervical or supraclavicular adenopathy Lungs no rales or rhonchi, auscultated anterolaterallyally Heart regular rate and rhythm Abd soft, nontender, no organomegaly MSK fingers thickened but PIP joints not tender or erythematous, no obvious joint space sweillng Neuro: non-focal, well-oriented, positiveaffect   LAB RESULTS:  CMP     Component Value Date/Time   NA 132* 12/21/2014 0347   K 4.0 12/21/2014 0347   CL 97 12/21/2014 0347   CO2 24 12/21/2014 0347   GLUCOSE 111* 12/21/2014 0347   BUN 13 12/21/2014 0347   CREATININE 0.97 12/24/2014 0455   CALCIUM 8.6 12/21/2014 0347   PROT 6.9 12/19/2014 0531   ALBUMIN 2.5* 12/19/2014 0531   AST 24 12/19/2014 0531   ALT 10 12/19/2014 0531   ALKPHOS 60 12/19/2014 0531   BILITOT 0.5 12/19/2014 0531   GFRNONAA 80* 12/24/2014 0455   GFRAA >90 12/24/2014 0455    INo results found for: SPEP, UPEP  Lab Results  Component Value Date   WBC 19.9* 12/24/2014   NEUTROABS 9.2* 12/18/2014   HGB 11.6* 12/24/2014   HCT 35.9* 12/24/2014   MCV 88.9 12/24/2014   PLT 375 12/24/2014    @LASTCHEMISTRY @  No results found for: LABCA2  No  components found for: LABCA125  No results for input(s): INR in the last 168 hours.  Urinalysis    Component Value Date/Time   COLORURINE YELLOW 12/18/2014 0056   APPEARANCEUR CLEAR 12/18/2014 0056   LABSPEC 1.019 12/18/2014 0056   PHURINE 5.0 12/18/2014 0056   GLUCOSEU NEGATIVE 12/18/2014 0056   HGBUR NEGATIVE 12/18/2014 0056   BILIRUBINUR NEGATIVE 12/18/2014 0056   KETONESUR NEGATIVE 12/18/2014 0056   PROTEINUR NEGATIVE 12/18/2014 0056   UROBILINOGEN 1.0 12/18/2014 0056   NITRITE NEGATIVE 12/18/2014 0056   LEUKOCYTESUR NEGATIVE 12/18/2014 0056  STUDIES: Dg Chest 2 View  12/21/2014   CLINICAL DATA:  Pneumonia  EXAM: CHEST  2 VIEW  COMPARISON:  12/19/2014  FINDINGS: Cardiomediastinal silhouette is stable. Hyperinflation again noted. Again noted fibrotic changes lung bases. Streaky bilateral basilar atelectasis or infiltrate without change from prior exam. No pulmonary edema.  IMPRESSION: No pulmonary edema. Hyperinflation again noted. Again noted fibrotic changes lung bases. Persistent streaky bilateral basilar atelectasis or infiltrate.   Electronically Signed   By: Lahoma Crocker M.D.   On: 12/21/2014 19:47   Dg Chest 2 View  12/17/2014   CLINICAL DATA:  Shortness of breath.  Flu-like syndrome 1 week ago.  EXAM: CHEST  2 VIEW  COMPARISON:  10/25/2008.  FINDINGS: Mediastinum hilar structures are normal. Borderline cardiomegaly. Bibasilar pulmonary infiltrates, left side greater than right. These findings are consistent with pneumonia. Questionable nodular density right mid lung. This may be related to pulmonary infiltrates. No pleural effusion or pneumothorax. No acute bony abnormality.  IMPRESSION: 1. Bibasilar pulmonary infiltrates, left side greater than right. These findings are consistent bibasilar pneumonia.  2. Questionable nodular density over the right mid lung. This most likely relates to pulmonary infiltrates. This can be followed on subsequent chest x-rays to demonstrate clearing.   3.  Borderline cardiomegaly.   Electronically Signed   By: Marcello Moores  Register   On: 12/17/2014 16:05   Dg Shoulder Right  12/23/2014   CLINICAL DATA:  Bilateral shoulder pain and stiffness.  EXAM: RIGHT SHOULDER - 2+ VIEW  COMPARISON:  Two-view chest x-ray 12/21/2014  FINDINGS: There is no evidence of fracture or dislocation. There is no evidence of arthropathy or other focal bone abnormality. Soft tissues are unremarkable.  IMPRESSION: Negative right shoulder radiographs   Electronically Signed   By: San Morelle M.D.   On: 12/23/2014 12:42   Ct Chest Wo Contrast  12/19/2014   CLINICAL DATA:  Persistent fever and chills for past 5 weeks.  EXAM: CT CHEST WITHOUT CONTRAST  TECHNIQUE: Multidetector CT imaging of the chest was performed following the standard protocol without IV contrast material administration.  COMPARISON:  December 17, 2014 chest radiograph  FINDINGS: There are small bilateral pleural effusions. There is patchy fibrotic change in the periphery of each lung base. There is patchy airspace consolidation in both lower lobes, primarily in the posterior segment on the left and in the superior and posterior segments of the right. There is also patchy airspace consolidation in the inferior aspect of the lateral segment right middle lobe. There is mild upper and lower lobe bronchiectatic change bilaterally, more pronounced in the lower lobes than the upper lobes.  There are multiple small mediastinal lymph nodes. There is a lymph node is present just to the right of the inferior carina measuring 2.0 x 1.2 cm. There is a lymph node to the left of the carina measuring 2.1 x 1.5 cm. Remaining lymph nodes are either subcentimeter or upper normal in size.  There is a small pericardial effusion. There is no thoracic aortic aneurysm. There are scattered foci of coronary artery calcification. Thyroid appears unremarkable.  In the visualized upper abdomen, no focal lesions are identified on this noncontrast  enhanced study.  There are no blastic or lytic bone lesions.  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 There are small pleural effusions bilaterally as well as patchy areas of airspace consolidation. There is also a small pericardial effusion. The areas of airspace consolidation  most likely represent pneumonia, although alveolar edema could present in this manner. This finding coupled with a pleural effusions raises question of a degree of congestive heart failure, although the pleural effusions may be reactive secondary to bibasilar pneumonia.  There is a degree of bronchiectasis bilaterally.  Mild adenopathy is present of uncertain etiology.   Electronically Signed   By: Lowella Grip III M.D.   On: 12/19/2014 09:42   Dg Shoulder Left  12/23/2014   CLINICAL DATA:  Bilateral shoulder pain/stiffness x1 week, no known injury  EXAM: LEFT SHOULDER - 2+ VIEW  COMPARISON:  None.  FINDINGS: No fracture or dislocation is seen.  The joint spaces are preserved.  Visualized soft tissues are within normal limits.  Visualized left lung is clear.  IMPRESSION: No acute osseus abnormality is seen.   Electronically Signed   By: Julian Hy M.D.   On: 12/23/2014 12:42   Dg Knee Complete 4 Views Left  12/20/2014   CLINICAL DATA:  Left lateral knee pain.  EXAM: LEFT KNEE - COMPLETE 4+ VIEW  COMPARISON:  None.  FINDINGS: No acute fracture or dislocation. Moderate-severe lateral femorotibial compartment osteoarthritis with severe joint space narrowing and marginal osteophytosis. No significant joint effusion.  IMPRESSION: Severe lateral femorotibial compartment osteoarthritis.   Electronically Signed   By: Kathreen Devoid   On: 12/20/2014 13:02    ASSESSMENT: 74 y.o. [place] man presenting with low-greade fevers, chills, diffuse joint pain and anasarca, in the setting of CAP. Extensive workup has included   (a)  ID: negative HIV, all blood cultures negative, do not find urine culture but urinalysis 12/18/2014 clear; CT chest shows no obvious PNA: there is bilateral lower lobe IS edema vs fibrosis, with small bilateral effusions and a small pericardial effusion  (b) hematologic: mild leukocytosis w neutrophilia, mild normocytic anemia, elevated ferritin, normal B-12 and folate, LDH 267, low reticulocytes  (c) oncologic: SPEP, UPEP and urine IFE show no monoclonality; there is diffuse polyclonal gamma globulin increase; fat pad Bx for AA amyloidosis pending (expect it to be negative)  (d) cardiovascular: normal EF on echo, no DVT of bilateral LE dopplers  (e) rheumatologic: ESR 70, RF 22.8, SSB 1.6, multiple other antibodies WNL or pending  PLAN: I believe Travis [name] has a rheumatologic condition I am not smart enough to diagnose. It is affecting his lungs, liver (not making albumin, making globulins instead), joints and other systems. I expect if he were started on steroids he would improve dramatically but unless there is an expected delay I think the better plan is to get Travis [name] to a competent rheumatologist ASAP. If we cannot obtain an inpatient consult then he could be discharged with appt at a rheumatology clinic within 24 hours. Another option would be to transfer to a tertiary care center.  I do not believe we are dealing with a primary hematologic problem so will not arrange for a visit with me, but I will follow peripherally and review the fat pad biopsy results when available.  Appreciate consulting on this case!  Chauncey Cruel, MD   12/24/2014 5:07 PM Medical Oncology and Hematology [facility] [address] [place], [place] [address] Tel. [phone]    Fax. [phone]

## 2014-12-24 NOTE — Progress Notes (Signed)
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 Pna and  PCCM requested to see am 4/10 for ? ILD  STUDIES:  CT chest s contrast 4/7 Evidence of bilateral lower lobe peripheral interstitial fibrosis. 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 There are small pleural effusions bilaterally as well as patchy areas of airspace consolidation. There is also a small pericardial effusion  SIGNIFICANT EVENTS:     HISTORY OF PRESENT ILLNESS:   74 y.o. male with no significant past medical history was referred to the ER after patient's chest x-ray was showing possible pneumonia. Patient has been having subjective feelings of fever and chills over the last 4-5 weeks. Patient had gone to urgent care center and was given symptomatic treatment despite which patient did not improve and patient went to his PCP> chest x-ray which showed pneumonic process and was referred to the ER. Patient states also last 2 days in addition patient has developed swelling of both his lower extremities hands and face with intense pain of his joints mostly the knees. Denies any chest pain. Has been having some shortness of breath occasionally with some nonproductive cough   No obvious day to day or daytime variabilty or assoc   cp or chest tightness, subjective wheeze overt sinus or hb symptoms. No unusual exp hx or h/o childhood pna/ asthma or knowledge of premature birth.  Sleeping ok without nocturnal  or early am exacerbation  of respiratory  c/o's or need for noct saba. Also denies any obvious fluctuation of symptoms  with weather or environmental changes or other aggravating or alleviating factors except as outlined above    SUBJECTIVE:  [place] in breathing  VITAL SIGNS: Temp:  [98.1 F (36.7 C)-100.6 F (38.1 C)] 98.1 F (36.7 C) (04/12 0505) Pulse Rate:  [98-105] 98 (04/12 0505) Resp:  [20] 20 (04/12 0505) BP: (126-153)/(63-70) 126/67 mmHg (04/12 0505) SpO2:  [93 %-95 %] 94 % (04/12 0505) Weight:  [221 lb (100.245 kg)] 221 lb (100.245 kg) (04/12 0505) HEMODYNAMICS:   VENTILATOR SETTINGS:   INTAKE / OUTPUT:  Intake/Output Summary (Last 24 hours) at 12/24/14 1022 Last data filed at 12/24/14 0843  Gross per 24 hour  Intake    760 ml  Output   1700 ml  Net   -940 ml    PHYSICAL EXAMINATION: General:   Pleasant bm nad, to weak to sit up without assistance. Pt alert, approp nad @ 30 degrees No jvd Oropharynx clear Neck supple Lungs with a insp crackles bilaterally s cough on insp RRR no s3 or or sign murmur Abd mild obese with nl excursion   Extr wam with no edema or clubbing noted Neuro  No motor deficits   LABS:  CBC  Recent Labs Lab 12/22/14 0500 12/23/14 0831 12/24/14 0455  WBC 16.8* 18.0* 19.9*  HGB 11.5* 11.9* 11.6*  HCT 35.8* 37.2* 35.9*  PLT 348 358 375   Coag's No results for input(s): APTT, INR in the last 168 hours. BMET  Recent Labs Lab 12/18/14 0356 12/19/14 0531 12/21/14 0347 12/24/14 0455  NA 134*  133* 132*  --   K 4.2 4.2 4.0  --   CL 101 101 97  --   CO2 26 26 24   --   BUN 9 10 13   --   CREATININE 0.97 0.96 0.97 0.97  GLUCOSE 106* 113* 111*  --    Electrolytes  Recent Labs Lab 12/18/14 0356 12/19/14 0531 12/21/14 0347  CALCIUM 8.5 8.6 8.6   Sepsis Markers  Recent Labs Lab 12/17/14 2011  LATICACIDVEN 1.35   ABG No results for input(s): PHART, PCO2ART, PO2ART in the last 168 hours. Liver Enzymes  Recent Labs Lab 12/17/14 1934 12/18/14 0356 12/19/14 0531  AST 23 21 24   ALT 11 10 10   ALKPHOS 66 62 60  BILITOT 0.9 0.6 0.5   ALBUMIN 2.9* 2.6* 2.5*   Cardiac Enzymes No results for input(s): TROPONINI, PROBNP in the last 168 hours. Glucose No results for input(s): GLUCAP in the last 168 hours.  Imaging Dg Shoulder Right  12/23/2014   CLINICAL DATA:  Bilateral shoulder pain and stiffness.  EXAM: RIGHT SHOULDER - 2+ VIEW  COMPARISON:  Two-view chest x-ray 12/21/2014  FINDINGS: There is no evidence of fracture or dislocation. There is no evidence of arthropathy or other focal bone abnormality. Soft tissues are unremarkable.  IMPRESSION: Negative right shoulder radiographs   Electronically Signed   By: San Morelle M.D.   On: 12/23/2014 12:42   Dg Shoulder Left  12/23/2014   CLINICAL DATA:  Bilateral shoulder pain/stiffness x1 week, no known injury  EXAM: LEFT SHOULDER - 2+ VIEW  COMPARISON:  None.  FINDINGS: No fracture or dislocation is seen.  The joint spaces are preserved.  Visualized soft tissues are within normal limits.  Visualized left lung is clear.  IMPRESSION: No acute osseus abnormality is seen.   Electronically Signed   By: Julian Hy M.D.   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  Only in  active  smokers Langerhan's Cell  Histiocyctosis (eosinophilic granuomatosis),  DIP,  and Respiratory Bronchiolitis ILD also need to be considered,  but don't apply here.    CT findings are in the lower parts of the lungs only raising the question of dependent atelectasis.  He is back to room air with sats in the mid to high 90's.  The rheumatologic symptoms listed above are interesting however.    Rec - Would not recommend steroids at this time. - Treat as CAP as ordered. - Ambulatory desaturation study to see if O2 is needed with ambulation. - Will order ESR, CRP, ANA, RF 28 and ACE  levels for ILD work up. - Ambulate and get out of bed. - Continue diureses. - Needs work up as to why albumen is so low but will defer that to primary. - Will continue to follow with you.  Richardson Landry Minor ACNP Maryanna Shape PCCM Pager [phone] till 3 pm If no answer page [phone]  I reviewed CXR myself, hyperinflated with fibrotic changes at the bases.  RF is positive but other immune work up remains pending.  Problem List: - Fibrotic changes at the bases. - Hypoxemia. - Hyperinflation of the lungs. - RF positive.  Recommendations: - Continue to hold off steroids. - F/U on the rest of the autoimmune panel. - Titrate O2 to off as able. - Do not anticipate will need home O2. - I believe the RF is an incidental finding, doubt this is rheumatoid lung. - Continue abx. - Repeat CXR in AM.  Patient seen and examined, agree with above note.  I dictated the care and orders written for this patient under my direction.  Rush Farmer, MD [phone]  12/24/2014, 10:25 AM

## 2014-12-24 NOTE — Progress Notes (Signed)
VASCULAR LAB PRELIMINARY  PRELIMINARY  PRELIMINARY  PRELIMINARY  BLEV completed.    Preliminary report:  Negative DVT bilaterally.   No evidence of Baker's Cyst bilaterally.  August Albino, RVT 12/24/2014, 9:47 AM

## 2014-12-25 ENCOUNTER — Inpatient Hospital Stay (HOSPITAL_COMMUNITY): Payer: Medicare Other

## 2014-12-25 DIAGNOSIS — M25569 Pain in unspecified knee: Secondary | ICD-10-CM | POA: Insufficient documentation

## 2014-12-25 DIAGNOSIS — M25561 Pain in right knee: Secondary | ICD-10-CM

## 2014-12-25 DIAGNOSIS — E8809 Other disorders of plasma-protein metabolism, not elsewhere classified: Secondary | ICD-10-CM

## 2014-12-25 DIAGNOSIS — M25562 Pain in left knee: Secondary | ICD-10-CM

## 2014-12-25 DIAGNOSIS — R5381 Other malaise: Secondary | ICD-10-CM

## 2014-12-25 LAB — CBC
HCT: 36.3 % — ABNORMAL LOW (ref 39.0–52.0)
Hemoglobin: 11.2 g/dL — ABNORMAL LOW (ref 13.0–17.0)
MCH: 28 pg (ref 26.0–34.0)
MCHC: 30.9 g/dL (ref 30.0–36.0)
MCV: 90.8 fL (ref 78.0–100.0)
PLATELETS: 388 10*3/uL (ref 150–400)
RBC: 4 MIL/uL — ABNORMAL LOW (ref 4.22–5.81)
RDW: 14.1 % (ref 11.5–15.5)
WBC: 18.6 10*3/uL — AB (ref 4.0–10.5)

## 2014-12-25 LAB — ANCA TITERS
Atypical P-ANCA titer: <1:20 {titer}
C-ANCA: <1:20 {titer}
P-ANCA: <1:20 {titer}

## 2014-12-25 MED ORDER — PREDNISONE 20 MG PO TABS
20.0000 mg | ORAL_TABLET | Freq: Every day | ORAL | Status: DC
  Administered 2014-12-26: 20 mg via ORAL
  Filled 2014-12-25 (×2): qty 1

## 2014-12-25 MED ORDER — METHYLPREDNISOLONE SODIUM SUCC 125 MG IJ SOLR
60.0000 mg | Freq: Four times a day (QID) | INTRAMUSCULAR | Status: DC
  Administered 2014-12-25: 60 mg via INTRAVENOUS
  Filled 2014-12-25: qty 2

## 2014-12-25 NOTE — Progress Notes (Signed)
[name]  [name] [id number] DOB: 1940/11/13 DOA: 12/17/2014 PCP: [name], MD  Assessment/Plan: 1. 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 Appreciate input by our sub-specialists 2. Have also discussed case with Dr. Trudie Reed of Rheumatology who recommends starting taper of prednisone at 20mg  after receiving 60mg  solumedrol already received this AM. Recs to follow up closely with Rheumatology for further work up. 3. Of note, pt is ANA neg, but SSA and SSB pos, Rf pos 2. CAP 1. Completed 7 day course of azithrymycin and rocephin 2. Stable 3. Afebrile 3. Weakness 1. Pt/OT 2. 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 B12 deficiency 1. Cont suppliment 5. Leukocytosis 1. Stable, but afebrile 2. Pt does not appear toxic 3. Anticipate wbc to trend up in AM since pt received IV steroids this AM 6. Iron deficiency anemia 1. hgb stable 7. DVT prophylaxis 1. Lovenox  Code Status: Full Family Communication: Pt in room (indicate person spoken with, relationship, and if by phone, the number) Disposition Plan: Pending   Consultants:  Pulmonary  Hematology  Rheumatology - over phone  Procedures:    Antibiotics:  Azithromycin completed 7 day course on 4/12  Rocephin completed 7 day course on 4/12  HPI/Subjective: Feels weak today with B knee pains  Objective: Filed Vitals:   12/25/14 0002 12/25/14 0645 12/25/14 0959 12/25/14 1400  BP:  126/65 139/64 126/58  Pulse:  103 102 100  Temp: 100.4 F (38 C) 100.4 F (38 C)  98.4 F (36.9 C)  TempSrc:  Oral  Oral  Resp:  20 20 20   Height:      Weight:  98.476 kg (217 lb 1.6 oz)    SpO2:  95% 94% 97%    Intake/Output Summary (Last 24 hours) at 12/25/14 1620 Last data filed at 12/25/14 1400  Gross per 24 hour  Intake   1590 ml  Output   1350 ml  Net    240 ml   Filed Weights   12/23/14 0640 12/24/14 0505 12/25/14 0645  Weight: 100.8 kg (222 lb 3.6 oz) 100.245 kg (221 lb)  98.476 kg (217 lb 1.6 oz)    Exam:   General:  Awake, in nad  Cardiovascular: regular,s 1, s2  Respiratory: normal resp effort, no wheezing  Abdomen: soft,nondistended  Musculoskeletal: B UE swelling with diffuse joint pains   Data Reviewed: Basic Metabolic Panel:  Recent Labs Lab 12/19/14 0531 12/21/14 0347 12/24/14 0455  NA 133* 132*  --   [name] 4.2 4.0  --   CL 101 97  --   CO2 26 24  --   GLUCOSE 113* 111*  --   BUN 10 13  --   CREATININE 0.96 0.97 0.97  CALCIUM 8.6 8.6  --    Liver Function Tests:  Recent Labs Lab 12/19/14 0531  AST 24  ALT 10  ALKPHOS 60  BILITOT 0.5  PROT 6.9  ALBUMIN 2.5*   No results for input(s): LIPASE, AMYLASE in the last 168 hours. No results for input(s): AMMONIA in the last 168 hours. CBC:  Recent Labs Lab 12/20/14 1223 12/22/14 0500 12/23/14 0831 12/24/14 0455 12/25/14 0320  WBC 18.6* 16.8* 18.0* 19.9* 18.6*  HGB 12.6* 11.5* 11.9* 11.6* 11.2*  HCT 38.3* 35.8* 37.2* 35.9* 36.3*  MCV 89.9 90.2 89.6 88.9 90.8  PLT 361 348 358 375 388   Cardiac Enzymes:  Recent Labs Lab 12/21/14 1120  CKTOTAL 88   BNP (last 3 results)  Recent Labs  12/17/14 1934  BNP 38.6  ProBNP (last 3 results) No results for input(s): PROBNP in the last 8760 hours.  CBG: No results for input(s): GLUCAP in the last 168 hours.  Recent Results (from the past 240 hour(s))  Culture, blood (routine x 2)     Status: None   Collection Time: 12/17/14  7:34 PM  Result Value Ref Range Status   Specimen Description BLOOD ARM LEFT  Final   Special Requests BOTTLES DRAWN AEROBIC ONLY 4CC  Final   Culture   Final    NO GROWTH 5 DAYS Performed at [facility]    Report Status 12/24/2014 FINAL  Final  Culture, blood (routine x 2)     Status: None   Collection Time: 12/17/14  9:02 PM  Result Value Ref Range Status   Specimen Description BLOOD LEFT ARM  Final   Special Requests BOTTLES DRAWN AEROBIC AND ANAEROBIC 5CC  Final   Culture    Final    NO GROWTH 5 DAYS Performed at [facility]    Report Status 12/24/2014 FINAL  Final  Culture, blood (routine x 2)     Status: None (Preliminary result)   Collection Time: 12/23/14  4:25 PM  Result Value Ref Range Status   Specimen Description BLOOD LEFT ASSIST CONTROL  Final   Special Requests BOTTLES DRAWN AEROBIC ONLY 10CC  Final   Culture   Final           BLOOD CULTURE RECEIVED NO GROWTH TO DATE CULTURE WILL BE HELD FOR 5 DAYS BEFORE ISSUING A FINAL NEGATIVE REPORT Performed at [facility]    Report Status PENDING  Incomplete  Culture, blood (routine x 2)     Status: None (Preliminary result)   Collection Time: 12/23/14  4:40 PM  Result Value Ref Range Status   Specimen Description BLOOD LEFT ASSIST CONTROL  Final   Special Requests BOTTLES DRAWN AEROBIC ONLY 8CC BLUE TOP  Final   Culture   Final           BLOOD CULTURE RECEIVED NO GROWTH TO DATE CULTURE WILL BE HELD FOR 5 DAYS BEFORE ISSUING A FINAL NEGATIVE REPORT Performed at [facility]    Report Status PENDING  Incomplete  Gram stain     Status: None   Collection Time: 12/24/14  2:48 PM  Result Value Ref Range Status   Specimen Description URINE, CLEAN CATCH  Final   Special Requests NONE  Final   Gram Stain   Final    CYTOSPIN PREP DEGENERATED CELLULAR MATERIAL PRESENT NO ORGANISMS SEEN    Report Status 12/24/2014 FINAL  Final     Studies: Dg Chest Port 1 View  12/25/2014   CLINICAL DATA:  74 year old male with respiratory failure. Recent fever and abnormal chest CT. Initial encounter.  EXAM: PORTABLE CHEST - 1 VIEW  COMPARISON:  12/21/2014 and earlier.  FINDINGS: Portable AP semi upright view at 0608 hours. Progressed and confluent bibasilar opacity since 12/17/2014. Stable cardiac size and mediastinal contours. Stable mild increased interstitial markings elsewhere. No pneumothorax. No overt edema.  IMPRESSION: Progressed confluent bibasilar pulmonary opacity over this series of  exams, favor bilateral pneumonia.   Electronically Signed   By: Genevie Ann M.D.   On: 12/25/2014 08:10    Scheduled Meds: . vitamin B-12  500 mcg Oral Daily  . enoxaparin (LOVENOX) injection  40 mg Subcutaneous Daily  . feeding supplement (ENSURE ENLIVE)  237 mL Oral BID BM  . ferrous sulfate  325 mg Oral BID  WC  . furosemide  20 mg Intravenous Daily  . multivitamin with minerals  1 tablet Oral Daily  . nystatin  5 mL Oral QID  . [START ON 12/26/2014] predniSONE  20 mg Oral Q breakfast  . sodium chloride  3 mL Intravenous Q12H   Continuous Infusions:   Principal Problem:   Anasarca Active Problems:   CAP (community acquired pneumonia)   Fever   Elevated blood pressure   Normocytic anemia   Postinflammatory pulmonary fibrosis   Shoulder pain    Travis [name]  [facility] Pager [phone]. If 7PM-7AM, please contact night-coverage at [email or web address], password [facility] 12/25/2014, 4:20 PM  LOS: 8 days

## 2014-12-25 NOTE — Progress Notes (Signed)
Discussed this case with Dr. Brantley Stage and Dr. Wyline Copas.  There is low yield for diagnosis with a skin punch biopsy for th suspected diagnosis (amyloidosis), the only punches we have here are 3-4 mm in size.  I can not guarantee we get enough subcutaneous tissue to be helpful.  Uptodate sources that bone marrow biopsy or solid organ biopsy yields improved percentage of diagnosis.  May be better to send to tertiary care center or OP ASAP Rheum workup.  Coralie Keens, PA-C [facility] [phone] 8:30am

## 2014-12-25 NOTE — Progress Notes (Signed)
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 Pna and  PCCM requested to see am 4/10 for ? ILD  STUDIES:  CT chest s contrast 4/7>>>Evidence of bilateral lower lobe peripheral interstitial fibrosis. 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 There are small pleural effusions bilaterally as well as patchy areas of airspace consolidation. There is also a small pericardial effusion  SIGNIFICANT EVENTS:    SUBJECTIVE:  No sig change.  Resting comfortably but remains easily dyspneic with activity per nursing.   VITAL SIGNS: Temp:  [98.2 F (36.8 C)-100.4 F (38 C)] 100.4 F (38 C) (04/13 0645) Pulse Rate:  [103-110] 103 (04/13 0645) Resp:  [18-20] 20 (04/13 0645) BP: (126-146)/(65-69) 126/65 mmHg (04/13 0645) SpO2:  [92 %-95 %] 95 % (04/13 0645) Weight:  [217 lb 1.6 oz (98.476 kg)] 217 lb 1.6 oz (98.476 kg) (04/13 0645)    INTAKE / OUTPUT:  Intake/Output Summary (Last 24 hours) at 12/25/14 0948 Last data filed at 12/25/14 0645  Gross per 24 hour  Intake    867 ml  Output   1425 ml  Net   -558 ml    PHYSICAL EXAMINATION:  General: wdwn male, weak, NAD resting in bed  Neuro: resting comfortably, non focal, MAE, gen weakness  CV:  s1s2 rrr PULM: resps even non labored at rest, easily dyspneic with activity per nsg  GI:  abd soft, +bs  Extremities:  Warm and dry, 1+ BLE edema   LABS:  CBC  Recent Labs Lab 12/23/14 0831 12/24/14 0455 12/25/14 0320  WBC 18.0* 19.9* 18.6*  HGB 11.9* 11.6* 11.2*  HCT 37.2* 35.9* 36.3*  PLT 358 375 388   Coag's No results for  input(s): APTT, INR in the last 168 hours. BMET  Recent Labs Lab 12/19/14 0531 12/21/14 0347 12/24/14 0455  NA 133* 132*  --   K 4.2 4.0  --   CL 101 97  --   CO2 26 24  --   BUN 10 13  --   CREATININE 0.96 0.97 0.97  GLUCOSE 113* 111*  --    Electrolytes  Recent Labs Lab 12/19/14 0531 12/21/14 0347  CALCIUM 8.6 8.6   Sepsis Markers No results for input(s): LATICACIDVEN, PROCALCITON, O2SATVEN in the last 168 hours. ABG No results for input(s): PHART, PCO2ART, PO2ART in the last 168 hours. Liver Enzymes  Recent Labs Lab 12/19/14 0531  AST 24  ALT 10  ALKPHOS 60  BILITOT 0.5  ALBUMIN 2.5*   Cardiac Enzymes No results for input(s): TROPONINI, PROBNP in the last 168 hours. Glucose No results for input(s): GLUCAP in the last 168 hours.  Imaging No results found.   Lab Results  Component Value Date   ESRSEDRATE 70* 12/18/2014  RA  28.8         12/18/14  BNP  38.6 on 12/17/14  U/a neg protein 4/6 with nl prot/creat ratio  Intake/Output Summary (Last 24 hours) at 12/25/14 0948 Last data filed at 12/25/14 0645  Gross per 24 hour  Intake    867 ml  Output  1425 ml  Net   -558 ml   ASSESSMENT / PLAN:  Hypoxemia  - Acute ILD by hx -- I believe patient has an inflammatory pneumonitis at the bases, will start high dose steroids.  I reviewed the CXR myself, bibasilar infiltrate consistent with CT findings. 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 - Persistent Hypoxemia - Supplemental O2 as needed, will order an ambulatory desat study when able to ambulate. - RF positive - Likely a part of the diagnosis of Sjogren syndrome. - Weakness and physical deconditioning - likely a part of Sjogren as well, will ask PT to see him, maintain OOB to chair as well as start treatment of Sjogren syndrome.  - Persistent fluid overload due to hypoalbumenemia - Lasix as ordered. -  Hypoalbumenemia - related to auto-immune disorder with high gama and lambda protein chains.  Encourage diet. - Hyper gama and lambda protein chain: concern for multiple myeloma, will order a skeletal survey.  Discussed at length with Dr. Earlie Counts and plan finalized.  Will start on high dose steroids today and order a skeletal survey, if improved then will discharge with f/u with rheumatology as there is not rheumatology follow up in [facility].  This was all explained at length to the patient including steroids and their side effects.  Will follow with you.  Rush Farmer, M.D. [facility] Pulmonary/Critical Care Medicine. Pager: [phone]. After hours pager: [phone].  12/25/2014  9:48 AM

## 2014-12-25 NOTE — Progress Notes (Signed)
Physical Therapy Treatment Patient Details Name: Travis [name] MRN: [id number] DOB: [birth year] Today's Date: 12/25/2014    History of Present Illness 74 year old male with no significant past medical history presents with fever and chills, he was found to have community acquired pneumonia and sepsis.     PT Comments    Pt admitted with above diagnosis. Pt currently with functional limitations due to balance and endurance deficits.  Pt varies in endurance and mobility from day to day.  MD started pt on new med today so hopeful that pt will progress from here.  Wife present and agrees with use of equipment as below at home with HHPT f/u.   Pt will benefit from skilled PT to increase their independence and safety with mobility to allow discharge to the venue listed below.    Follow Up Recommendations  Home health PT;Supervision/Assistance - 24 hour     Equipment Recommendations  Rolling walker with 5" wheels;3in1 (PT)    Recommendations for Other Services       Precautions / Restrictions Precautions Precautions: Fall Restrictions Weight Bearing Restrictions: No    Mobility  Bed Mobility               General bed mobility comments: NT in bathroom with wife on arrival.    Transfers                 General transfer comment: Wife holdingboth of pts hands and letting him pull up from him sitting on toilet.    Ambulation/Gait Ambulation/Gait assistance: Supervision Ambulation Distance (Feet): 750 Feet Assistive device: Rolling walker (2 wheeled) Gait Pattern/deviations: Step-through pattern;Decreased stride length;Trunk flexed;Antalgic   Gait velocity interpretation: Below normal speed for age/gender General Gait Details: Pt asked for RW.  Initially pt was flexed over quite a bit but did stand taller after walking some.  Needed cues to maintain as upright posture as possible.  Pt states he has been bending over more for awhile now.  Pt definitely needed RW today  but very safe with RW.     Stairs            Wheelchair Mobility    Modified Rankin (Stroke Patients Only)       Balance           Standing balance support: Bilateral upper extremity supported;During functional activity Standing balance-Leahy Scale: Poor Standing balance comment: Needing UE support of RW for balance and for upright posture.              High level balance activites: Turns;Sudden stops;Direction changes High Level Balance Comments: supervision only     Cognition Arousal/Alertness: Awake/alert Behavior During Therapy: WFL for tasks assessed/performed Overall Cognitive Status: Within Functional Limits for tasks assessed                      Exercises      General Comments        Pertinent Vitals/Pain Pain Assessment: No/denies pain  VSS    Home Living                      Prior Function            PT Goals (current goals can now be found in the care plan section) Progress towards PT goals: Progressing toward goals    Frequency  Min 3X/week    PT Plan Current plan remains appropriate    Co-evaluation  End of Session Equipment Utilized During Treatment: Gait belt Activity Tolerance: Patient limited by fatigue Patient left: in bed;with call bell/phone within reach;with family/visitor present     Time: [id number] PT Time Calculation (min) (ACUTE ONLY): 17 min  Charges:  $Gait Training: 8-22 mins                    G Codes:      WhiteGodfrey Pick 01-13-2015, 2:17 PM [employer] Acute Rehabilitation [phone] (pager)

## 2014-12-26 DIAGNOSIS — R0600 Dyspnea, unspecified: Secondary | ICD-10-CM | POA: Insufficient documentation

## 2014-12-26 DIAGNOSIS — J8410 Pulmonary fibrosis, unspecified: Secondary | ICD-10-CM | POA: Insufficient documentation

## 2014-12-26 LAB — CBC WITH DIFFERENTIAL/PLATELET
Basophils Absolute: 0 10*3/uL (ref 0.0–0.1)
Basophils Relative: 0 % (ref 0–1)
Eosinophils Absolute: 0 10*3/uL (ref 0.0–0.7)
Eosinophils Relative: 0 % (ref 0–5)
HCT: 35.3 % — ABNORMAL LOW (ref 39.0–52.0)
Hemoglobin: 11.3 g/dL — ABNORMAL LOW (ref 13.0–17.0)
Lymphocytes Relative: 5 % — ABNORMAL LOW (ref 12–46)
Lymphs Abs: 1.2 10*3/uL (ref 0.7–4.0)
MCH: 28.5 pg (ref 26.0–34.0)
MCHC: 32 g/dL (ref 30.0–36.0)
MCV: 88.9 fL (ref 78.0–100.0)
Monocytes Absolute: 2.3 10*3/uL — ABNORMAL HIGH (ref 0.1–1.0)
Monocytes Relative: 10 % (ref 3–12)
NEUTROS ABS: 19 10*3/uL — AB (ref 1.7–7.7)
NEUTROS PCT: 85 % — AB (ref 43–77)
PLATELETS: 415 10*3/uL — AB (ref 150–400)
RBC: 3.97 MIL/uL — AB (ref 4.22–5.81)
RDW: 13.9 % (ref 11.5–15.5)
WBC: 22.6 10*3/uL — ABNORMAL HIGH (ref 4.0–10.5)

## 2014-12-26 LAB — COMPREHENSIVE METABOLIC PANEL
ALT: 33 U/L (ref 0–53)
ANION GAP: 11 (ref 5–15)
AST: 49 U/L — ABNORMAL HIGH (ref 0–37)
Albumin: 2.2 g/dL — ABNORMAL LOW (ref 3.5–5.2)
Alkaline Phosphatase: 96 U/L (ref 39–117)
BUN: 22 mg/dL (ref 6–23)
CALCIUM: 8.8 mg/dL (ref 8.4–10.5)
CO2: 25 mmol/L (ref 19–32)
Chloride: 96 mmol/L (ref 96–112)
Creatinine, Ser: 0.94 mg/dL (ref 0.50–1.35)
GFR, EST NON AFRICAN AMERICAN: 81 mL/min — AB (ref >90)
GLUCOSE: 143 mg/dL — AB (ref 70–99)
POTASSIUM: 4.9 mmol/L (ref 3.5–5.1)
Sodium: 132 mmol/L — ABNORMAL LOW (ref 135–145)
TOTAL PROTEIN: 6.5 g/dL (ref 6.0–8.3)
Total Bilirubin: 0.4 mg/dL (ref 0.3–1.2)

## 2014-12-26 LAB — UIFE/LIGHT CHAINS/TP QN, 24-HR UR
ALPHA 2 UR: DETECTED — AB
Albumin, U: DETECTED
Alpha 1, Urine: DETECTED — AB
Beta, Urine: DETECTED — AB
GAMMA UR: DETECTED — AB
Total Protein, Urine: 27 mg/dL — ABNORMAL HIGH (ref 5–25)

## 2014-12-26 MED ORDER — CYANOCOBALAMIN 500 MCG PO TABS: 500.0000 ug | ORAL_TABLET | Freq: Every day | ORAL | Status: DC

## 2014-12-26 MED ORDER — TRAMADOL-ACETAMINOPHEN 37.5-325 MG PO TABS: 1.0000 | ORAL_TABLET | ORAL | Status: DC | PRN

## 2014-12-26 MED ORDER — PREDNISONE 5 MG PO TABS: 5.0000 mg | ORAL_TABLET | Freq: Every day | ORAL | Status: DC

## 2014-12-26 MED ORDER — FERROUS SULFATE 325 (65 FE) MG PO TABS: 325.0000 mg | ORAL_TABLET | Freq: Two times a day (BID) | ORAL | Status: DC

## 2014-12-26 NOTE — Progress Notes (Signed)
The patient's VSS overnight.  He ambulated in the hallway.  He received Tramadol once overnight and states that his pain is a 0/10 this morning.

## 2014-12-26 NOTE — Progress Notes (Signed)
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 PNA and PCCM requested to see am 4/10 for ? ILD  STUDIES:  CT Chest s contrast 4/7 >> Evidence of bilateral lower lobe peripheral interstitial fibrosis.  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 Small pleural effusions bilaterally as well as patchy areas of airspace consolidation. Small pericardial effusion  SIGNIFICANT EVENTS: 4/05  Admit with SOB, swelling, arthralgias   SUBJECTIVE: Pt reports significant improvement in arthralgias but continues to have swelling.  No acute events.  Ambulated 4/13 with a walker.  Endorses generalized weakness.    VITAL SIGNS: Temp:  [98.1 F (36.7 C)-99.5 F (37.5 C)] 98.1 F (36.7 C) (04/14 0530) Pulse Rate:  [96-109] 96 (04/14 0530) Resp:  [18-20] 20 (04/14 0530) BP: (126-166)/(58-69) 141/64 mmHg (04/14 0530) SpO2:  [94 %-97 %] 96 % (04/14 0530) Weight:  [218 lb 8 oz (99.111 kg)] 218 lb 8 oz (99.111 kg) (04/14 0530)  INTAKE / OUTPUT:  Intake/Output Summary (Last 24 hours) at 12/26/14 0820 Last data filed at 12/26/14 0609  Gross per 24 hour  Intake   1680 ml  Output   1350 ml  Net    330 ml    PHYSICAL EXAMINATION: General: wdwn male, weak, NAD resting in bed  Neuro: resting comfortably, non focal, MAE, gen weakness but improved strength CV:  s1s2 rrr PULM: resps even non labored at rest, lungs bilaterally clear  GI:  abd soft, +bs  Extremities:  Warm and dry, 1+ BLE edema   LABS:  CBC  Recent Labs Lab 12/24/14 0455 12/25/14 0320 12/26/14 0522  WBC 19.9*  18.6* 22.6*  HGB 11.6* 11.2* 11.3*  HCT 35.9* 36.3* 35.3*  PLT 375 388 415*   BMET  Recent Labs Lab 12/21/14 0347 12/24/14 0455 12/26/14 0522  NA 132*  --  132*  K 4.0  --  4.9  CL 97  --  96  CO2 24  --  25  BUN 13  --  22  CREATININE 0.97 0.97 0.94  GLUCOSE 111*  --  143*   Liver Enzymes  Recent Labs Lab 12/26/14 0522  AST 49*  ALT 33  ALKPHOS 96  BILITOT 0.4  ALBUMIN 2.2*    AUTOIMMUNE: Sed Rate 4/6 >> 70 RA 46 >> 28.8 BNP 4/5 >> 38 UA 4/6 >> neg protein, normal protein / cr ratio  Imaging Dg Chest Port 1 View  12/25/2014   CLINICAL DATA:  74 year old male with respiratory failure. Recent fever and abnormal chest CT. Initial encounter.  EXAM: PORTABLE CHEST - 1 VIEW  COMPARISON:  12/21/2014 and earlier.  FINDINGS: Portable AP semi upright view at 0608 hours. Progressed and confluent bibasilar opacity since 12/17/2014. Stable cardiac size and mediastinal contours. Stable mild increased interstitial markings elsewhere. No pneumothorax. No overt edema.  IMPRESSION: Progressed confluent bibasilar pulmonary opacity over this series of exams, favor bilateral pneumonia.   Electronically Signed   By: Genevie Ann M.D.   On: 12/25/2014 08:10     Intake/Output Summary (Last 24 hours) at 12/26/14  0820 Last data filed at 12/26/14 0349  Gross per 24 hour  Intake   1680 ml  Output   1350 ml  Net    330 ml     ASSESSMENT / PLAN:  Hypoxemia - persistent.    Plan: O2 as needed to support sats > 90% Assess ambulatory desaturation prior to d/c Trend CXR PRN  Acute ILD by hx - suspect the patient has an inflammatory pneumonitis at the bases  Plan: Prednisone 20 mg with taper as outlined by Dr. Trudie Reed    Follow up with Pulmonary arranged (Dr. Chase Caller with a CXR review, 5/17 @ 2:30 PM)  Autoimmune disorder - Likely Sjogren syndrome given an extensive review of labs.  ANA neg but SSA/SSB positive, RF positive.   Plan: Steroids as above and monitor clinically.  RF  positive - Likely a part of the diagnosis of Sjogren syndrome.  Weakness and physical deconditioning - likely a part of Sjogren as well  Plan: PT Maintain OOB to chair as well as start treatment of Sjogren syndrome.   Persistent fluid overload due to hypoalbumenemia  Plan: Lasix 20 mg QD Monitor BMP Replace electrolytes as indicated.  Hypoalbumenemia - related to auto-immune disorder with high gama and lambda protein chains.  Encourage diet.  Hyper gama and lambda protein chain: concern for multiple myeloma, will order a skeletal survey.   PCCM will be available PRN.  Patient is off O2 at rest.  Check ambulatory desaturations prior to discharge.  Follow up arranged with Dr. Chase Caller.    Noe Gens, NP-C [facility] Pgr: [phone] or [phone]  74 year old male with likely Sjogren syndrome.  Improved with starting steroids yesterday.  He was able to stand up and speak with me.  Asking to go home.  I reviewed CXR myself, consistent with bibasilar infiltrate noted on CT and some plate like atelectasis.  Hypoxemia: due to pneumonitis that can be associated with Sjogren syndrome.  Will order an ambulatory desat study as I anticipate will need home O2 until pneumonitis improves.  Hypoalbumen: related to autoimmune disorder, encourage diet.  Continue steroids as ordered.  Acute ILD: due to Sjogren associated inflammation.  Steroids ordered.  Supplemental O2.  Fluid overload: due to hypoalbumen, continue steroids and encourage high protein diet.  New atelectasis: IS and flutter valve ordered.  PT to ambulate.  Encourage out of bed to chair.  PCCM will sign off at this point, please call back if needed.  Patient seen and examined, agree with above note.  I dictated the care and orders written for this patient under my direction.  Rush Farmer, MD [phone]  12/26/2014  8:20 AM

## 2014-12-26 NOTE — Progress Notes (Signed)
SATURATION QUALIFICATIONS: (This note is used to comply with regulatory documentation for home oxygen)  Patient Saturations on Room Air at Rest = 96%  Patient Saturations on Room Air while Ambulating = 89-92%  No signs of SOB or distress noted.

## 2014-12-26 NOTE — Progress Notes (Signed)
DC IV, DC Tele, DC Home. Discharge instructions and home medications discussed with patient and patient's wife. Patient and wife denied any questions or concerns at this time. Patient leaving unit via wheelchair and appears in no acute distress. 

## 2014-12-26 NOTE — Discharge Summary (Signed)
Physician Discharge Summary  Travis [name] [id number] DOB: 1940/09/17 DOA: 12/17/2014  PCP: [name], MD  Admit date: 12/17/2014 Discharge date: 12/26/2014  Time spent: 20 minutes  Recommendations for Outpatient Follow-up:  1. Follow up with PCP in 1-2 weeks 2. Follow up with Rheumatology as scheduled 3. Follow up with Pulmonary as scheduled  Discharge Diagnoses:  Principal Problem:   Anasarca Active Problems:   CAP (community acquired pneumonia)   Fever   Elevated blood pressure   Normocytic anemia   Postinflammatory pulmonary fibrosis   Shoulder pain   Knee pain   Discharge Condition: Stable  Diet recommendation: Heart healthy  Filed Weights   12/24/14 0505 12/25/14 0645 12/26/14 0530  Weight: 100.245 kg (221 lb) 98.476 kg (217 lb 1.6 oz) 99.111 kg (218 lb 8 oz)    History of present illness:  Please see admit h and p from   Hospital Course:  1. 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 Appreciate input by our sub-specialists 2. On 4/13, discussed case with Dr. Trudie Reed of Rheumatology who recommended starting slow taper of prednisone beginning at 20mg  on discharge. Recs to follow up closely with Rheumatology for further work up. 3. Pt has noted improvement after receiving steroids 4. Of note, pt is ANA neg, but SSA and SSB pos, Rf pos 5. Pt was also seen by Heme-Onc with recommendation to follow up with Rheumatology, that pt's issue is not primarily hematologic   6. Discussed case with Critical Care/Pulmonary with clearance for discharge and follow up chest x ray 2. CAP 1. Completed 7 day course of azithrymycin and rocephin 2. Stable 3. Afebrile 3. Weakness 1. Pt/OT - recs for home health 2. 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 B12 deficiency 1. Cont suppliment 5. Leukocytosis 1. Stable, but afebrile 2. Pt does not appear toxic 3. Trended up as anticipated after receiving high dose steroids 6. Iron deficiency anemia 1. hgb stable 7. DVT  prophylaxis 1. Lovenox  Consultations:  Pulmonary  Hematology  Rheumatology over phone  Discharge Exam: Filed Vitals:   12/26/14 0530 12/26/14 0900 12/26/14 1041 12/26/14 1206  BP: 141/64 137/65 125/65 141/64  Pulse: 96 93 90 97  Temp: 98.1 F (36.7 C) 97.6 F (36.4 C)    TempSrc: Oral Oral    Resp: 20 18  18   Height:      Weight: 99.111 kg (218 lb 8 oz)     SpO2: 96% 97%  95%    General: Awake, in nad Cardiovascular: regular, s1, s2 Respiratory: normal resp effort, no wheezing  Discharge Instructions     Medication List    TAKE these medications        cyanocobalamin 500 MCG tablet  Take 1 tablet (500 mcg total) by mouth daily.     ferrous sulfate 325 (65 FE) MG tablet  Take 1 tablet (325 mg total) by mouth 2 (two) times daily with a meal.     loratadine 10 MG tablet  Commonly known as:  CLARITIN  Take 10 mg by mouth daily.     predniSONE 5 MG tablet  Commonly known as:  DELTASONE  Take 1 tablet (5 mg total) by mouth daily with breakfast.     traMADol-acetaminophen 37.5-325 MG per tablet  Commonly known as:  ULTRACET  Take 1-2 tablets by mouth every 4 (four) hours as needed for severe pain.       No Known Allergies Follow-up Information    Follow up with [name], MD.   Specialty:  Family Medicine  Why:  Appt. on April 20th @ 11:00am   Contact information:   [place] [address] [phone]       Follow up with [facility], MD On 01/28/2015.   Specialty:  Pulmonary Disease   Why:  Appt at 2:30 PM with a follow up chest xray.     Contact information:   [place] [address] [phone]       Please follow up.   Why:  Appt. on May 25th @ 1;30pm   Contact information:   Dr. Gavin Pound [phone]       The results of significant diagnostics from this hospitalization (including imaging, microbiology, ancillary and laboratory) are listed below for reference.     Significant Diagnostic Studies: Dg Chest 2 View  12/21/2014   CLINICAL DATA:  Pneumonia  EXAM: CHEST  2 VIEW  COMPARISON:  12/19/2014  FINDINGS: Cardiomediastinal silhouette is stable. Hyperinflation again noted. Again noted fibrotic changes lung bases. Streaky bilateral basilar atelectasis or infiltrate without change from prior exam. No pulmonary edema.  IMPRESSION: No pulmonary edema. Hyperinflation again noted. Again noted fibrotic changes lung bases. Persistent streaky bilateral basilar atelectasis or infiltrate.   Electronically Signed   By: Lahoma Crocker M.D.   On: 12/21/2014 19:47   Dg Chest 2 View  12/17/2014   CLINICAL DATA:  Shortness of breath.  Flu-like syndrome 1 week ago.  EXAM: CHEST  2 VIEW  COMPARISON:  10/25/2008.  FINDINGS: Mediastinum hilar structures are normal. Borderline cardiomegaly. Bibasilar pulmonary infiltrates, left side greater than right. These findings are consistent with pneumonia. Questionable nodular density right mid lung. This may be related to pulmonary infiltrates. No pleural effusion or pneumothorax. No acute bony abnormality.  IMPRESSION: 1. Bibasilar pulmonary infiltrates, left side greater than right. These findings are consistent bibasilar pneumonia.  2. Questionable nodular density over the right mid lung. This most likely relates to pulmonary infiltrates. This can be followed on subsequent chest x-rays to demonstrate clearing.  3.  Borderline cardiomegaly.   Electronically Signed   By: Marcello Moores  Register   On: 12/17/2014 16:05   Dg Shoulder Right  12/23/2014   CLINICAL DATA:  Bilateral shoulder pain and stiffness.  EXAM: RIGHT SHOULDER - 2+ VIEW  COMPARISON:  Two-view chest x-ray 12/21/2014  FINDINGS: There is no evidence of fracture or dislocation. There is no evidence of arthropathy or other focal bone abnormality. Soft tissues are unremarkable.  IMPRESSION: Negative right shoulder radiographs   Electronically Signed   By: San Morelle M.D.   On:  12/23/2014 12:42   Ct Chest Wo Contrast  12/19/2014   CLINICAL DATA:  Persistent fever and chills for past 5 weeks.  EXAM: CT CHEST WITHOUT CONTRAST  TECHNIQUE: Multidetector CT imaging of the chest was performed following the standard protocol without IV contrast material administration.  COMPARISON:  December 17, 2014 chest radiograph  FINDINGS: There are small bilateral pleural effusions. There is patchy fibrotic change in the periphery of each lung base. There is patchy airspace consolidation in both lower lobes, primarily in the posterior segment on the left and in the superior and posterior segments of the right. There is also patchy airspace consolidation in the inferior aspect of the lateral segment right middle lobe. There is mild upper and lower lobe bronchiectatic change bilaterally, more pronounced in the lower lobes than the upper lobes.  There are multiple small mediastinal lymph nodes. There is a lymph node is present just to the right  of the inferior carina measuring 2.0 x 1.2 cm. There is a lymph node to the left of the carina measuring 2.1 x 1.5 cm. Remaining lymph nodes are either subcentimeter or upper normal in size.  There is a small pericardial effusion. There is no thoracic aortic aneurysm. There are scattered foci of coronary artery calcification. Thyroid appears unremarkable.  In the visualized upper abdomen, no focal lesions are identified on this noncontrast enhanced study.  There are no blastic or lytic bone lesions.  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 There are small pleural effusions bilaterally as well as patchy areas of airspace consolidation. There is also a small pericardial effusion. The areas of airspace consolidation most likely represent pneumonia, although alveolar edema could present in this manner. This finding coupled with a pleural effusions raises question of a  degree of congestive heart failure, although the pleural effusions may be reactive secondary to bibasilar pneumonia.  There is a degree of bronchiectasis bilaterally.  Mild adenopathy is present of uncertain etiology.   Electronically Signed   By: Lowella Grip III M.D.   On: 12/19/2014 09:42   Dg Chest Port 1 View  12/25/2014   CLINICAL DATA:  74 year old male with respiratory failure. Recent fever and abnormal chest CT. Initial encounter.  EXAM: PORTABLE CHEST - 1 VIEW  COMPARISON:  12/21/2014 and earlier.  FINDINGS: Portable AP semi upright view at 0608 hours. Progressed and confluent bibasilar opacity since 12/17/2014. Stable cardiac size and mediastinal contours. Stable mild increased interstitial markings elsewhere. No pneumothorax. No overt edema.  IMPRESSION: Progressed confluent bibasilar pulmonary opacity over this series of exams, favor bilateral pneumonia.   Electronically Signed   By: Genevie Ann M.D.   On: 12/25/2014 08:10   Dg Shoulder Left  12/23/2014   CLINICAL DATA:  Bilateral shoulder pain/stiffness x1 week, no known injury  EXAM: LEFT SHOULDER - 2+ VIEW  COMPARISON:  None.  FINDINGS: No fracture or dislocation is seen.  The joint spaces are preserved.  Visualized soft tissues are within normal limits.  Visualized left lung is clear.  IMPRESSION: No acute osseus abnormality is seen.   Electronically Signed   By: Julian Hy M.D.   On: 12/23/2014 12:42   Dg Knee Complete 4 Views Left  12/20/2014   CLINICAL DATA:  Left lateral knee pain.  EXAM: LEFT KNEE - COMPLETE 4+ VIEW  COMPARISON:  None.  FINDINGS: No acute fracture or dislocation. Moderate-severe lateral femorotibial compartment osteoarthritis with severe joint space narrowing and marginal osteophytosis. No significant joint effusion.  IMPRESSION: Severe lateral femorotibial compartment osteoarthritis.   Electronically Signed   By: Kathreen Devoid   On: 12/20/2014 13:02    Microbiology: Recent Results (from the past 240 hour(s))   Culture, blood (routine x 2)     Status: None   Collection Time: 12/17/14  7:34 PM  Result Value Ref Range Status   Specimen Description BLOOD ARM LEFT  Final   Special Requests BOTTLES DRAWN AEROBIC ONLY 4CC  Final   Culture   Final    NO GROWTH 5 DAYS Performed at [facility]    Report Status 12/24/2014 FINAL  Final  Culture, blood (routine x 2)     Status: None   Collection Time: 12/17/14  9:02 PM  Result Value Ref Range Status   Specimen Description BLOOD LEFT ARM  Final   Special Requests BOTTLES DRAWN AEROBIC AND ANAEROBIC 5CC  Final   Culture   Final    NO GROWTH 5 DAYS Performed at [facility]    Report Status 12/24/2014 FINAL  Final  Culture, blood (routine x 2)     Status: None (Preliminary result)   Collection Time: 12/23/14  4:25 PM  Result Value Ref Range Status   Specimen Description BLOOD LEFT ASSIST CONTROL  Final   Special Requests BOTTLES DRAWN AEROBIC ONLY 10CC  Final   Culture   Final           BLOOD CULTURE RECEIVED NO GROWTH TO DATE CULTURE WILL BE HELD FOR 5 DAYS BEFORE ISSUING A FINAL NEGATIVE REPORT Performed at [facility]    Report Status PENDING  Incomplete  Culture, blood (routine x 2)     Status: None (Preliminary result)   Collection Time: 12/23/14  4:40 PM  Result Value Ref Range Status   Specimen Description BLOOD LEFT ASSIST CONTROL  Final   Special Requests BOTTLES DRAWN AEROBIC ONLY [place] TOP  Final   Culture   Final           BLOOD CULTURE RECEIVED NO GROWTH TO DATE CULTURE WILL BE HELD FOR 5 DAYS BEFORE ISSUING A FINAL NEGATIVE REPORT Performed at [facility]    Report Status PENDING  Incomplete  Gram stain     Status: None   Collection Time: 12/24/14  2:48 PM  Result Value Ref Range Status   Specimen Description URINE, CLEAN CATCH  Final   Special Requests NONE  Final   Gram Stain   Final    CYTOSPIN PREP DEGENERATED CELLULAR MATERIAL PRESENT NO ORGANISMS SEEN    Report Status 12/24/2014  FINAL  Final     Labs: Basic Metabolic Panel:  Recent Labs Lab 12/21/14 0347 12/24/14 0455 12/26/14 0522  NA 132*  --  132*  K 4.0  --  4.9  CL 97  --  96  CO2 24  --  25  GLUCOSE 111*  --  143*  BUN 13  --  22  CREATININE 0.97 0.97 0.94  CALCIUM 8.6  --  8.8   Liver Function Tests:  Recent Labs Lab 12/26/14 0522  AST 49*  ALT 33  ALKPHOS 96  BILITOT 0.4  PROT 6.5  ALBUMIN 2.2*   No results for input(s): LIPASE, AMYLASE in the last 168 hours. No results for input(s): AMMONIA in the last 168 hours. CBC:  Recent Labs Lab 12/22/14 0500 12/23/14 0831 12/24/14 0455 12/25/14 0320 12/26/14 0522  WBC 16.8* 18.0* 19.9* 18.6* 22.6*  NEUTROABS  --   --   --   --  19.0*  HGB 11.5* 11.9* 11.6* 11.2* 11.3*  HCT 35.8* 37.2* 35.9* 36.3* 35.3*  MCV 90.2 89.6 88.9 90.8 88.9  PLT 348 358 375 388 415*   Cardiac Enzymes:  Recent Labs Lab 12/21/14 1120  CKTOTAL 88   BNP: BNP (last 3 results)  Recent Labs  12/17/14 1934  BNP 38.6    ProBNP (last 3 results) No results for input(s): PROBNP in the last 8760 hours.  CBG: No results for input(s): GLUCAP in the last 168 hours.   Signed:  Almus Woodham K  [facility] 12/26/2014, 1:50 PM

## 2014-12-30 LAB — CULTURE, BLOOD (ROUTINE X 2)
CULTURE: NO GROWTH
CULTURE: NO GROWTH

## 2015-01-02 ENCOUNTER — Emergency Department (HOSPITAL_COMMUNITY): Payer: Medicare Other

## 2015-01-02 ENCOUNTER — Emergency Department (HOSPITAL_COMMUNITY)
Admission: EM | Admit: 2015-01-02 | Discharge: 2015-01-02 | Disposition: A | Discharge status: Home / Self Care | Payer: Medicare Other | Attending: Emergency Medicine | Admitting: Emergency Medicine

## 2015-01-02 ENCOUNTER — Encounter (HOSPITAL_COMMUNITY): Payer: Self-pay | Admitting: Emergency Medicine

## 2015-01-02 DIAGNOSIS — Z8719 Personal history of other diseases of the digestive system: Secondary | ICD-10-CM | POA: Insufficient documentation

## 2015-01-02 DIAGNOSIS — R0602 Shortness of breath: Secondary | ICD-10-CM | POA: Diagnosis present

## 2015-01-02 DIAGNOSIS — Z79899 Other long term (current) drug therapy: Secondary | ICD-10-CM | POA: Insufficient documentation

## 2015-01-02 DIAGNOSIS — R079 Chest pain, unspecified: Secondary | ICD-10-CM | POA: Diagnosis not present

## 2015-01-02 DIAGNOSIS — Z8709 Personal history of other diseases of the respiratory system: Secondary | ICD-10-CM | POA: Diagnosis not present

## 2015-01-02 DIAGNOSIS — M1990 Unspecified osteoarthritis, unspecified site: Secondary | ICD-10-CM | POA: Diagnosis not present

## 2015-01-02 LAB — BASIC METABOLIC PANEL
ANION GAP: 9 (ref 5–15)
BUN: 22 mg/dL (ref 6–23)
CO2: 22 mmol/L (ref 19–32)
Calcium: 8.5 mg/dL (ref 8.4–10.5)
Chloride: 102 mmol/L (ref 96–112)
Creatinine, Ser: 1 mg/dL (ref 0.50–1.35)
GFR calc Af Amer: 84 mL/min — ABNORMAL LOW (ref >90)
GFR, EST NON AFRICAN AMERICAN: 73 mL/min — AB (ref >90)
Glucose, Bld: 124 mg/dL — ABNORMAL HIGH (ref 70–99)
POTASSIUM: 4.9 mmol/L (ref 3.5–5.1)
SODIUM: 133 mmol/L — AB (ref 135–145)

## 2015-01-02 LAB — I-STAT TROPONIN, ED
TROPONIN I, POC: 0.01 ng/mL (ref 0.00–0.08)
TROPONIN I, POC: 0 ng/mL (ref 0.00–0.08)

## 2015-01-02 LAB — CBC WITH DIFFERENTIAL/PLATELET
BASOS PCT: 0 % (ref 0–1)
Basophils Absolute: 0 10*3/uL (ref 0.0–0.1)
EOS PCT: 0 % (ref 0–5)
Eosinophils Absolute: 0 10*3/uL (ref 0.0–0.7)
HEMATOCRIT: 36.5 % — AB (ref 39.0–52.0)
HEMOGLOBIN: 11.5 g/dL — AB (ref 13.0–17.0)
LYMPHS PCT: 4 % — AB (ref 12–46)
Lymphs Abs: 1 10*3/uL (ref 0.7–4.0)
MCH: 28.5 pg (ref 26.0–34.0)
MCHC: 31.5 g/dL (ref 30.0–36.0)
MCV: 90.3 fL (ref 78.0–100.0)
MONO ABS: 1.7 10*3/uL — AB (ref 0.1–1.0)
Monocytes Relative: 7 % (ref 3–12)
Neutro Abs: 21.3 10*3/uL — ABNORMAL HIGH (ref 1.7–7.7)
Neutrophils Relative %: 89 % — ABNORMAL HIGH (ref 43–77)
Platelets: 401 10*3/uL — ABNORMAL HIGH (ref 150–400)
RBC: 4.04 MIL/uL — ABNORMAL LOW (ref 4.22–5.81)
RDW: 14.7 % (ref 11.5–15.5)
WBC: 24 10*3/uL — ABNORMAL HIGH (ref 4.0–10.5)

## 2015-01-02 LAB — BRAIN NATRIURETIC PEPTIDE: B Natriuretic Peptide: 66.6 pg/mL (ref 0.0–100.0)

## 2015-01-02 LAB — D-DIMER, QUANTITATIVE (NOT AT ARMC): D DIMER QUANT: 4.65 ug{FEU}/mL — AB (ref 0.00–0.48)

## 2015-01-02 MED ORDER — IOHEXOL 350 MG/ML SOLN
100.0000 mL | Freq: Once | INTRAVENOUS | Status: AC | PRN
  Administered 2015-01-02: 100 mL via INTRAVENOUS

## 2015-01-02 NOTE — Discharge Instructions (Signed)
Your CT scan today did not show any worsening with your pneumonia and no blood clot. You will need to follow up with your doctor. Continue to take your medications. Return as needed for worsening symptoms.  Shortness of Breath Shortness of breath means you have trouble breathing. Shortness of breath needs medical care right away. HOME CARE   Do not smoke.  Avoid being around chemicals or things (paint fumes, dust) that may bother your breathing.  Rest as needed. Slowly begin your normal activities.  Only take medicines as told by your doctor.  Keep all doctor visits as told. GET HELP RIGHT AWAY IF:   Your shortness of breath gets worse.  You feel lightheaded, pass out (faint), or have a cough that is not helped by medicine.  You cough up blood.  You have pain with breathing.  You have pain in your chest, arms, shoulders, or belly (abdomen).  You have a fever.  You cannot walk up stairs or exercise the way you normally do.  You do not get better in the time expected.  You have a hard time doing normal activities even with rest.  You have problems with your medicines.  You have any new symptoms. MAKE SURE YOU:  Understand these instructions.  Will watch your condition.  Will get help right away if you are not doing well or get worse. Document Released: 02/16/2008 Document Revised: 09/04/2013 Document Reviewed: 11/15/2011 [facility] Patient Information 2015 [place], [place]. This information is not intended to replace advice given to you by your health care provider. Make sure you discuss any questions you have with your health care provider.

## 2015-01-02 NOTE — ED Provider Notes (Signed)
CSN: [id number]     Arrival date & time 01/02/15  1211 History   First MD Initiated Contact with Patient 01/02/15 1225     Chief Complaint  Patient presents with  . Shortness of Breath  . Chest Pain     (Consider location/radiation/quality/duration/timing/severity/associated sxs/prior Treatment) HPI Travis [name] is a 74 y.o. male recently discharged from the hospital 4/13 for community-acquired pneumonia treated with Zithromax and Rocephin, comes in for evaluation of shortness of breath, cough and chest discomfort. Patient states since he was discharged home his shortness of breath has increased. He reports increased difficulties sleeping at night and finds himself breathing more rapidly and more shallow. He reports with deep inspiration he has a sharp stinging pain in his central chest that is 10/10 at its worst. Denies chest discomfort now. Reports associated subjective fever at home yesterday of 101.5. He denies any new leg swelling or leg pain. Denies headache, change in vision, abdominal pain, nausea vomiting, diaphoresis, numbness or weakness, syncope, rash.  Past Medical History  Diagnosis Date  . Medical history non-contributory   . Arthritis   . Pneumonia   . GERD (gastroesophageal reflux disease)    Past Surgical History  Procedure Laterality Date  . Hemorroidectomy  1999  . Surgical procedure to remove a mole as a child Right Eye area    At around 48 years old   Family History  Problem Relation Age of Onset  . Hypertension Mother    History  Substance Use Topics  . Smoking status: Never Smoker   . Smokeless tobacco: Not on file  . Alcohol Use: No    Review of Systems A 10 point review of systems was completed and was negative except for pertinent positives and negatives as mentioned in the history of present illness     Allergies  Review of patient's allergies indicates no known allergies.  Home Medications   Prior to Admission medications    Medication Sig Start Date End Date Taking? Authorizing Provider  cyanocobalamin 500 MCG tablet Take 1 tablet (500 mcg total) by mouth daily. 12/26/14  Yes Donne Hazel, MD  ferrous sulfate 325 (65 FE) MG tablet Take 1 tablet (325 mg total) by mouth 2 (two) times daily with a meal. 12/26/14  Yes Donne Hazel, MD  ibuprofen (ADVIL,MOTRIN) 200 MG tablet Take 200 mg by mouth every 6 (six) hours as needed.   Yes Historical Provider, MD  loratadine (CLARITIN) 10 MG tablet Take 10 mg by mouth daily.   Yes Historical Provider, MD  predniSONE (DELTASONE) 5 MG tablet Take 1 tablet (5 mg total) by mouth daily with breakfast. 12/26/14  Yes Donne Hazel, MD  traMADol-acetaminophen (ULTRACET) 37.5-325 MG per tablet Take 1-2 tablets by mouth every 4 (four) hours as needed for severe pain. 12/26/14  Yes Donne Hazel, MD   BP 149/68 mmHg  Pulse 103  Temp(Src) 99.3 F (37.4 C) (Oral)  Resp 27  Wt 220 lb 1.6 oz (99.837 kg)  SpO2 96% Physical Exam  Constitutional: He is oriented to person, place, and time. He appears well-developed and well-nourished. No distress.  HENT:  Head: Normocephalic and atraumatic.  Mouth/Throat: Oropharynx is clear and moist.  Eyes: Conjunctivae are normal. Pupils are equal, round, and reactive to light. Right eye exhibits no discharge. Left eye exhibits no discharge. No scleral icterus.  Neck: Neck supple.  Cardiovascular: Normal rate, regular rhythm and normal heart sounds.   Pulmonary/Chest: Effort normal and breath sounds normal. No  respiratory distress. He has no wheezes. He has no rales.  Bilateral basilar rales. No tachypnea on exam. Oxygen saturations 96% on room air.  Abdominal: Soft. There is no tenderness.  Musculoskeletal: He exhibits edema. He exhibits no tenderness.  2+ pitting edema to proximal tibia bilaterally. No weeping  Neurological: He is alert and oriented to person, place, and time.  Cranial Nerves II-XII grossly intact  Skin: Skin is warm and dry. No  rash noted. He is not diaphoretic.  Psychiatric: He has a normal mood and affect.  Nursing note and vitals reviewed.   ED Course  Procedures (including critical care time) Labs Review Labs Reviewed  BASIC METABOLIC PANEL - Abnormal; Notable for the following:    Sodium 133 (*)    Glucose, Bld 124 (*)    GFR calc non Af Amer 73 (*)    GFR calc Af Amer 84 (*)    All other components within normal limits  CBC WITH DIFFERENTIAL/PLATELET - Abnormal; Notable for the following:    WBC 24.0 (*)    RBC 4.04 (*)    Hemoglobin 11.5 (*)    HCT 36.5 (*)    Platelets 401 (*)    Neutrophils Relative % 89 (*)    Lymphocytes Relative 4 (*)    Neutro Abs 21.3 (*)    Monocytes Absolute 1.7 (*)    All other components within normal limits  D-DIMER, QUANTITATIVE - Abnormal; Notable for the following:    D-Dimer, Quant 4.65 (*)    All other components within normal limits  BRAIN NATRIURETIC PEPTIDE  I-STAT TROPOININ, ED  I-STAT TROPOININ, ED    Imaging Review Dg Chest 2 View  01/02/2015   CLINICAL DATA:  Chest pain, shortness of breath, swelling of the legs  EXAM: CHEST  2 VIEW  COMPARISON:  Portable chest x-ray of 12/25/2014  FINDINGS: Bibasilar parenchymal opacities remain. With little interval change, these opacities most likely represent pulmonary fibrosis. No effusion is noted. Cardiomegaly is stable. No acute bony abnormality is seen.  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 No definite effusion. Stable cardiomegaly.   Electronically Signed   By: Ivar Drape M.D.   On: 01/02/2015 14:52   Ct Angio Chest Pe W/cm &/or Wo Cm  01/02/2015   CLINICAL DATA:  Shortness of breath, chest pain, recently discharge from hospital for same symptoms, leg swelling now taking diuretic  EXAM: CT ANGIOGRAPHY CHEST WITH CONTRAST  TECHNIQUE: Multidetector CT imaging of the chest was performed using the standard protocol during bolus administration of intravenous contrast. Multiplanar CT  image reconstructions and MIPs were obtained to evaluate the vascular anatomy.  CONTRAST:  169mL OMNIPAQUE IOHEXOL 350 MG/ML SOLN IV  COMPARISON:  12/19/2014 noncontrast CT chest  FINDINGS: Small pericardial effusion.  Aorta normal caliber without aneurysm or dissection.  Few coronary arterial calcifications noted.  Diffuse wall thickening of the thoracic esophagus.  Probable enlarged subcarinal lymph node 13 mm short axis image 51.  Minimally enlarged RIGHT paratracheal lymph node 11 mm short axis image 29.  Additional mediastinal nodes are normal to upper normal in size.  Visualized portion of upper abdomen normal.  Pulmonary arterial branches in the lower lobes are suboptimally assessed due to respiratory motion artifacts and extensive infiltrates.  No gross evidence of pulmonary embolism identified.  Small BILATERAL pleural effusions.  Extensive airspace infiltrates in BILATERAL lower lobes and in RIGHT middle lobe.  Upper lobes clear.  Small calcified lymph nodes at LEFT hilum.  No pneumothorax or  acute osseous findings.  Review of the MIP images confirms the above findings.  IMPRESSION: No gross evidence of pulmonary embolism as discussed above.  BILATERAL lower lobe and mild RIGHT middle lobe pulmonary infiltrates persist.  Small BILATERAL pleural and small pericardial effusions.  Diffuse thickening of esophageal wall, also present on prior exam, suggesting a diffuse esophagitis not significantly changed.   Electronically Signed   By: Lavonia Dana M.D.   On: 01/02/2015 17:04     EKG Interpretation   Date/Time:  Thursday January 02 2015 12:45:09 EDT Ventricular Rate:  96 PR Interval:  162 QRS Duration: 95 QT Interval:  339 QTC Calculation: 428 R Axis:   -32 Text Interpretation:  Sinus rhythm Left axis deviation Otherwise no  significant change Confirmed by HARRISON  MD, FORREST ([id number]) on 01/02/2015  1:32:20 PM     Meds given in ED:  Medications  iohexol (OMNIPAQUE) 350 MG/ML injection 100 mL  (100 mLs Intravenous Contrast Given 01/02/15 1639)    Discharge Medication List as of 01/02/2015  6:05 PM     Filed Vitals:   01/02/15 1715 01/02/15 1730 01/02/15 1745 01/02/15 1815  BP: 146/72 148/71 151/67 149/68  Pulse: 105 101 102 103  Temp:      TempSrc:      Resp: 27 26 24 27   Weight:      SpO2: 99% 97% 98% 96%    MDM  Vitals stable - afebrile Pt resting comfortably in ED. PE--No tachypnea on exam. Pt carries on conversation without difficulty. Recheck at 4:00pm, no tachypnea or respiratory distress, SOB. Labwork--BNP 66, leukocytosis of 24, however patient maintains an elevated leukocytosis. Dimer 4.65, Repeat Troponin neg. Imaging--chest x-ray is unchanged. Pending CT angiogram chest to rule out PE.  Care signed out to [facility] Pending Delta Troponin, CTA chest and if pt feels well, may be discharged home to f/u with PCP. Prior to signout, Discussed and reviewed case with attending, Dr. Aline Brochure, who saw pt and agrees with plan. Final diagnoses:  Shortness of breath        Comer Locket, PA-C 01/02/15 2115  Pamella Pert, MD 01/02/15 2118

## 2015-01-02 NOTE — ED Notes (Signed)
Per EMS, pt coming in for evaluation of CP and SOB. Pt reports that he was recently d/c from the hospital for the same. Per EMS pt coming in to rule out pneumonia. Pt also has swelling to his legs and was started on a diuretic.  NAD at this time. Pt alert x4.

## 2015-01-02 NOTE — ED Notes (Signed)
Patient ambulated to restroom with assistance and walker.

## 2015-01-06 ENCOUNTER — Emergency Department (HOSPITAL_COMMUNITY): Payer: Medicare Other

## 2015-01-06 ENCOUNTER — Inpatient Hospital Stay (HOSPITAL_COMMUNITY)
Admission: EM | Admit: 2015-01-06 | Discharge: 2015-01-09 | DRG: 195 | Disposition: A | Discharge status: Home / Self Care | Payer: Medicare Other | Attending: Internal Medicine | Admitting: Internal Medicine

## 2015-01-06 ENCOUNTER — Encounter (HOSPITAL_COMMUNITY): Payer: Self-pay | Admitting: *Deleted

## 2015-01-06 DIAGNOSIS — R0600 Dyspnea, unspecified: Secondary | ICD-10-CM

## 2015-01-06 DIAGNOSIS — J189 Pneumonia, unspecified organism: Secondary | ICD-10-CM | POA: Diagnosis present

## 2015-01-06 DIAGNOSIS — R739 Hyperglycemia, unspecified: Secondary | ICD-10-CM

## 2015-01-06 DIAGNOSIS — J8410 Pulmonary fibrosis, unspecified: Secondary | ICD-10-CM | POA: Diagnosis present

## 2015-01-06 DIAGNOSIS — Z7952 Long term (current) use of systemic steroids: Secondary | ICD-10-CM

## 2015-01-06 DIAGNOSIS — M1990 Unspecified osteoarthritis, unspecified site: Secondary | ICD-10-CM | POA: Diagnosis present

## 2015-01-06 DIAGNOSIS — K219 Gastro-esophageal reflux disease without esophagitis: Secondary | ICD-10-CM | POA: Diagnosis present

## 2015-01-06 DIAGNOSIS — D649 Anemia, unspecified: Secondary | ICD-10-CM | POA: Diagnosis present

## 2015-01-06 DIAGNOSIS — T380X5A Adverse effect of glucocorticoids and synthetic analogues, initial encounter: Secondary | ICD-10-CM | POA: Diagnosis present

## 2015-01-06 DIAGNOSIS — R609 Edema, unspecified: Secondary | ICD-10-CM | POA: Diagnosis not present

## 2015-01-06 DIAGNOSIS — R000 Tachycardia, unspecified: Secondary | ICD-10-CM

## 2015-01-06 DIAGNOSIS — J849 Interstitial pulmonary disease, unspecified: Secondary | ICD-10-CM | POA: Diagnosis not present

## 2015-01-06 DIAGNOSIS — R601 Generalized edema: Secondary | ICD-10-CM | POA: Diagnosis present

## 2015-01-06 DIAGNOSIS — R0602 Shortness of breath: Secondary | ICD-10-CM | POA: Diagnosis not present

## 2015-01-06 HISTORY — DX: Hyperglycemia, unspecified: R73.9

## 2015-01-06 HISTORY — DX: Adverse effect of glucocorticoids and synthetic analogues, initial encounter: T38.0X5A

## 2015-01-06 LAB — CBC WITH DIFFERENTIAL/PLATELET
BASOS PCT: 0 % (ref 0–1)
Basophils Absolute: 0.1 10*3/uL (ref 0.0–0.1)
EOS ABS: 0 10*3/uL (ref 0.0–0.7)
EOS PCT: 0 % (ref 0–5)
HEMATOCRIT: 37 % — AB (ref 39.0–52.0)
Hemoglobin: 11.9 g/dL — ABNORMAL LOW (ref 13.0–17.0)
Lymphocytes Relative: 6 % — ABNORMAL LOW (ref 12–46)
Lymphs Abs: 1.3 10*3/uL (ref 0.7–4.0)
MCH: 28.5 pg (ref 26.0–34.0)
MCHC: 32.2 g/dL (ref 30.0–36.0)
MCV: 88.5 fL (ref 78.0–100.0)
MONO ABS: 1.3 10*3/uL — AB (ref 0.1–1.0)
MONOS PCT: 5 % (ref 3–12)
Neutro Abs: 21.7 10*3/uL — ABNORMAL HIGH (ref 1.7–7.7)
Neutrophils Relative %: 89 % — ABNORMAL HIGH (ref 43–77)
PLATELETS: 278 10*3/uL (ref 150–400)
RBC: 4.18 MIL/uL — AB (ref 4.22–5.81)
RDW: 15 % (ref 11.5–15.5)
WBC: 24.4 10*3/uL — AB (ref 4.0–10.5)

## 2015-01-06 LAB — COMPREHENSIVE METABOLIC PANEL
ALK PHOS: 136 U/L — AB (ref 39–117)
ALT: 37 U/L (ref 0–53)
AST: 46 U/L — ABNORMAL HIGH (ref 0–37)
Albumin: 2.2 g/dL — ABNORMAL LOW (ref 3.5–5.2)
Anion gap: 8 (ref 5–15)
BUN: 19 mg/dL (ref 6–23)
CHLORIDE: 102 mmol/L (ref 96–112)
CO2: 22 mmol/L (ref 19–32)
Calcium: 8.5 mg/dL (ref 8.4–10.5)
Creatinine, Ser: 1.08 mg/dL (ref 0.50–1.35)
GFR calc Af Amer: 77 mL/min — ABNORMAL LOW (ref >90)
GFR calc non Af Amer: 66 mL/min — ABNORMAL LOW (ref >90)
Glucose, Bld: 137 mg/dL — ABNORMAL HIGH (ref 70–99)
POTASSIUM: 4.4 mmol/L (ref 3.5–5.1)
Sodium: 132 mmol/L — ABNORMAL LOW (ref 135–145)
TOTAL PROTEIN: 6.7 g/dL (ref 6.0–8.3)
Total Bilirubin: 0.7 mg/dL (ref 0.3–1.2)

## 2015-01-06 LAB — URINE MICROSCOPIC-ADD ON

## 2015-01-06 LAB — URINALYSIS, ROUTINE W REFLEX MICROSCOPIC
BILIRUBIN URINE: NEGATIVE
GLUCOSE, UA: NEGATIVE mg/dL
HGB URINE DIPSTICK: NEGATIVE
KETONES UR: NEGATIVE mg/dL
NITRITE: NEGATIVE
PH: 5 (ref 5.0–8.0)
PROTEIN: NEGATIVE mg/dL
Specific Gravity, Urine: 1.02 (ref 1.005–1.030)
Urobilinogen, UA: 1 mg/dL (ref 0.0–1.0)

## 2015-01-06 LAB — I-STAT CG4 LACTIC ACID, ED: Lactic Acid, Venous: 1.61 mmol/L (ref 0.5–2.0)

## 2015-01-06 LAB — I-STAT TROPONIN, ED: Troponin i, poc: 0.01 ng/mL (ref 0.00–0.08)

## 2015-01-06 LAB — BRAIN NATRIURETIC PEPTIDE: B Natriuretic Peptide: 83.3 pg/mL (ref 0.0–100.0)

## 2015-01-06 LAB — STREP PNEUMONIAE URINARY ANTIGEN: Strep Pneumo Urinary Antigen: NEGATIVE

## 2015-01-06 MED ORDER — METHYLPREDNISOLONE SODIUM SUCC 125 MG IJ SOLR
60.0000 mg | Freq: Four times a day (QID) | INTRAMUSCULAR | Status: DC
  Administered 2015-01-06 – 2015-01-07 (×4): 60 mg via INTRAVENOUS
  Filled 2015-01-06: qty 2
  Filled 2015-01-06 (×4): qty 0.96
  Filled 2015-01-06: qty 2

## 2015-01-06 MED ORDER — DEXTROSE 5 % IV SOLN
2.0000 g | Freq: Once | INTRAVENOUS | Status: AC
  Administered 2015-01-06: 2 g via INTRAVENOUS
  Filled 2015-01-06: qty 2

## 2015-01-06 MED ORDER — PNEUMOCOCCAL VAC POLYVALENT 25 MCG/0.5ML IJ INJ: 0.5000 mL | INJECTION | INTRAMUSCULAR | Status: DC | PRN

## 2015-01-06 MED ORDER — VANCOMYCIN HCL IN DEXTROSE 1-5 GM/200ML-% IV SOLN
1000.0000 mg | Freq: Once | INTRAVENOUS | Status: DC
Start: 2015-01-06 — End: 2015-01-06

## 2015-01-06 MED ORDER — LORATADINE 10 MG PO TABS
10.0000 mg | ORAL_TABLET | Freq: Every day | ORAL | Status: DC
  Administered 2015-01-06 – 2015-01-09 (×4): 10 mg via ORAL
  Filled 2015-01-06 (×4): qty 1

## 2015-01-06 MED ORDER — CYANOCOBALAMIN 500 MCG PO TABS
500.0000 ug | ORAL_TABLET | Freq: Every day | ORAL | Status: DC
  Administered 2015-01-06 – 2015-01-09 (×4): 500 ug via ORAL
  Filled 2015-01-06 (×4): qty 1

## 2015-01-06 MED ORDER — SODIUM CHLORIDE 0.9 % IV SOLN: Freq: Once | INTRAVENOUS | Status: DC

## 2015-01-06 MED ORDER — CEFEPIME HCL 1 G IJ SOLR: 1.0000 g | Freq: Three times a day (TID) | INTRAMUSCULAR | Status: DC

## 2015-01-06 MED ORDER — TRAMADOL-ACETAMINOPHEN 37.5-325 MG PO TABS
1.0000 | ORAL_TABLET | ORAL | Status: DC | PRN
  Administered 2015-01-06 – 2015-01-08 (×2): 1 via ORAL
  Administered 2015-01-09: 2 via ORAL
  Filled 2015-01-06: qty 1
  Filled 2015-01-06: qty 2
  Filled 2015-01-06: qty 1

## 2015-01-06 MED ORDER — VANCOMYCIN HCL 10 G IV SOLR
1500.0000 mg | Freq: Once | INTRAVENOUS | Status: AC
  Administered 2015-01-06: 1500 mg via INTRAVENOUS
  Filled 2015-01-06: qty 1500

## 2015-01-06 MED ORDER — DEXTROSE 5 % IV SOLN
2.0000 g | Freq: Two times a day (BID) | INTRAVENOUS | Status: DC
  Administered 2015-01-07 – 2015-01-09 (×5): 2 g via INTRAVENOUS
  Filled 2015-01-06 (×5): qty 2

## 2015-01-06 MED ORDER — VANCOMYCIN HCL 10 G IV SOLR
1250.0000 mg | Freq: Two times a day (BID) | INTRAVENOUS | Status: DC
  Administered 2015-01-07 – 2015-01-08 (×4): 1250 mg via INTRAVENOUS
  Filled 2015-01-06 (×6): qty 1250

## 2015-01-06 MED ORDER — HEPARIN SODIUM (PORCINE) 5000 UNIT/ML IJ SOLN
5000.0000 [IU] | Freq: Three times a day (TID) | INTRAMUSCULAR | Status: DC
  Administered 2015-01-06 – 2015-01-09 (×8): 5000 [IU] via SUBCUTANEOUS
  Filled 2015-01-06 (×11): qty 1

## 2015-01-06 MED ORDER — SODIUM CHLORIDE 0.9 % IV BOLUS (SEPSIS)
1000.0000 mL | INTRAVENOUS | Status: AC
  Administered 2015-01-06 (×2): 1000 mL via INTRAVENOUS

## 2015-01-06 MED ORDER — FERROUS SULFATE 325 (65 FE) MG PO TABS
325.0000 mg | ORAL_TABLET | Freq: Two times a day (BID) | ORAL | Status: DC
  Administered 2015-01-07 – 2015-01-09 (×5): 325 mg via ORAL
  Filled 2015-01-06 (×7): qty 1

## 2015-01-06 MED ORDER — ENSURE ENLIVE PO LIQD
237.0000 mL | Freq: Two times a day (BID) | ORAL | Status: DC
  Administered 2015-01-07 – 2015-01-09 (×6): 237 mL via ORAL

## 2015-01-06 MED ORDER — SODIUM CHLORIDE 0.9 % IV SOLN
INTRAVENOUS | Status: DC
  Administered 2015-01-06: 22:00:00 via INTRAVENOUS

## 2015-01-06 NOTE — ED Notes (Signed)
Pt is aware urine is needed for testing, urinal at bedside. 

## 2015-01-06 NOTE — Progress Notes (Signed)
ANTIBIOTIC CONSULT NOTE - INITIAL  Pharmacy Consult for vancomycin and cefepime Indication: HCAP  No Known Allergies  Patient Measurements: Height: 5\' 10"  (177.8 cm) Weight: 216 lb (97.977 kg) IBW/kg (Calculated) : 73   Vital Signs: Temp: 99.5 F (37.5 C) (04/25 1805) Temp Source: Rectal (04/25 1805) BP: 143/78 mmHg (04/25 1730) Pulse Rate: 113 (04/25 1730) Intake/Output from previous day:   Intake/Output from this shift:    Labs:  Recent Labs  01/06/15 1609  WBC 24.4*  HGB 11.9*  PLT 278  CREATININE 1.08   Estimated Creatinine Clearance: 71.5 mL/min (by C-G formula based on Cr of 1.08). No results for input(s): VANCOTROUGH, VANCOPEAK, VANCORANDOM, GENTTROUGH, GENTPEAK, GENTRANDOM, TOBRATROUGH, TOBRAPEAK, TOBRARND, AMIKACINPEAK, AMIKACINTROU, AMIKACIN in the last 72 hours.   Microbiology: Recent Results (from the past 720 hour(s))  Culture, blood (routine x 2)     Status: None   Collection Time: 12/17/14  7:34 PM  Result Value Ref Range Status   Specimen Description BLOOD ARM LEFT  Final   Special Requests BOTTLES DRAWN AEROBIC ONLY 4CC  Final   Culture   Final    NO GROWTH 5 DAYS Performed at [facility]    Report Status 12/24/2014 FINAL  Final  Culture, blood (routine x 2)     Status: None   Collection Time: 12/17/14  9:02 PM  Result Value Ref Range Status   Specimen Description BLOOD LEFT ARM  Final   Special Requests BOTTLES DRAWN AEROBIC AND ANAEROBIC 5CC  Final   Culture   Final    NO GROWTH 5 DAYS Performed at [facility]    Report Status 12/24/2014 FINAL  Final  Culture, blood (routine x 2)     Status: None   Collection Time: 12/23/14  4:25 PM  Result Value Ref Range Status   Specimen Description BLOOD LEFT ASSIST CONTROL  Final   Special Requests BOTTLES DRAWN AEROBIC ONLY 10CC  Final   Culture   Final    NO GROWTH 5 DAYS Performed at [facility]    Report Status 12/30/2014 FINAL  Final  Culture, blood (routine x  2)     Status: None   Collection Time: 12/23/14  4:40 PM  Result Value Ref Range Status   Specimen Description BLOOD LEFT ANTECUBITAL  Final   Special Requests BOTTLES DRAWN AEROBIC ONLY 8CC   Final   Culture   Final    NO GROWTH 5 DAYS Performed at [facility]    Report Status 12/30/2014 FINAL  Final  Gram stain     Status: None   Collection Time: 12/24/14  2:Travis PM  Result Value Ref Range Status   Specimen Description URINE, CLEAN CATCH  Final   Special Requests NONE  Final   Gram Stain   Final    CYTOSPIN PREP DEGENERATED CELLULAR MATERIAL PRESENT NO ORGANISMS SEEN    Report Status 12/24/2014 FINAL  Final    Medical History: Past Medical History  Diagnosis Date  . Medical history non-contributory   . Arthritis   . Pneumonia   . GERD (gastroesophageal reflux disease)     Medications:  Anti-infectives    Start     Dose/Rate Route Frequency Ordered Stop   01/07/15 0700  ceFEPIme (MAXIPIME) 2 g in dextrose 5 % 50 mL IVPB     2 g 100 mL/hr over 30 Minutes Intravenous Every 12 hours 01/06/15 1833     01/07/15 0700  vancomycin (VANCOCIN) 1,250 mg in sodium chloride  0.9 % 250 mL IVPB     1,250 mg 166.7 mL/hr over 90 Minutes Intravenous Every 12 hours 01/06/15 1834     01/06/15 1830  vancomycin (VANCOCIN) 1,500 mg in sodium chloride 0.9 % 500 mL IVPB     1,500 mg 250 mL/hr over 120 Minutes Intravenous  Once 01/06/15 1818     01/06/15 1815  ceFEPIme (MAXIPIME) 2 g in dextrose 5 % 50 mL IVPB     2 g 100 mL/hr over 30 Minutes Intravenous  Once 01/06/15 1811     01/06/15 1815  vancomycin (VANCOCIN) IVPB 1000 mg/200 mL premix  Status:  Discontinued     1,000 mg 200 mL/hr over 60 Minutes Intravenous  Once 01/06/15 1811 01/06/15 1818     Assessment: Travis [name] on prednisone PTA for Sjogren's presents with fevers and pneumonia, discharged from hospital 2 weeks ago for CAP, which was treated with azithromycin and ceftriaxone. Pharmacy consulted to dose vancomycin and  cefepime for sepsis due to HCAP. Tmax 99.7, WBC 24.4, SCr 1.08 (CrCl ~70-75 ml/min)  4/25 BCx2 >> 4/25 UCx >>  Vanc 4/25 >> Cefepime 4/25 >>  Goal of Therapy:  Vancomycin trough level 15-20 mcg/ml  Plan:  Vancomycin 1500 mg IV load, then 1250 mg IV q12h Cefepime 2 g IV q12h VTss if clinically indicated Monitor C&S, clinical progress  Whitney Muse, PharmD Candidate 01/06/2015,6:46 PM

## 2015-01-06 NOTE — Progress Notes (Signed)
Pt to 5w rm 39, via stretcher, a/o x4, c/o mid sternal pain d/t cough, level 3/10.

## 2015-01-06 NOTE — H&P (Signed)
[facility] History and Physical  [name] [id number] DOB: 1940/10/01 DOA: 01/06/2015  Referring physician: Abigail Butts, MD PCP: [name], MD   Chief Complaint: Pneumonia  HPI: Travis [name] is a 74 y.o. male recentlyu discharged after admission for a community acquired pneumonia. He comes in now after seeing his PCP. He states that he has been having difficulty breathing and states that he has had cough and congestion. He has been bringing up sputum which is clear. He states there has been no recent blood in his sputum. He has no chest pain noted. He states he had stayed in the hospital for almost a week. Patient received a 7 day course of Rocephin and Azithromycin. He was also to see a Rheumatologist which he has not yet done as his appointment is in May. He was prescribed prednisone. He states that he has noted his feet to be more swollen. He has no prior history of CHF. No recent echo. Patient states he has been very active and has been athletic all his life. This is very unusual for him to be short of breath and not able to do anything.    Review of Systems:  Constitutional:  No weight loss, night sweats, +Fevers, +chills, +fatigue.  HEENT:  No headaches, No sneezing, itching, ear ache, nasal congestion, post nasal drip,  Cardio-vascular:  +chest pain, no Orthopnea, PND, +swelling in lower extremities, no dizziness, palpitations  GI:  No heartburn, indigestion, abdominal pain, nausea, vomiting, diarrhea Resp:  +shortness of breath. +productive cough, No coughing up of blood.  Skin:  no rash or lesions.  GU:  no dysuria, change in color of urine Musculoskeletal:  +joint pain or swelling. Psych:  No change in mood or affect. No depression or anxiety   Past Medical History  Diagnosis Date  . Medical history non-contributory   . Arthritis   . Pneumonia   . GERD (gastroesophageal reflux disease)    Past Surgical History  Procedure  Laterality Date  . Hemorroidectomy  1999  . Surgical procedure to remove a mole as a child Right Eye area    At around 14 years old   Social History:  reports that he has never smoked. He does not have any smokeless tobacco history on file. He reports that he does not drink alcohol or use illicit drugs.  No Known Allergies  Family History  Problem Relation Age of Onset  . Hypertension Mother     Prior to Admission medications   Medication Sig Start Date End Date Taking? Authorizing Provider  cyanocobalamin 500 MCG tablet Take 1 tablet (500 mcg total) by mouth daily. 12/26/14   Donne Hazel, MD  ferrous sulfate 325 (65 FE) MG tablet Take 1 tablet (325 mg total) by mouth 2 (two) times daily with a meal. 12/26/14   Donne Hazel, MD  ibuprofen (ADVIL,MOTRIN) 200 MG tablet Take 200 mg by mouth every 6 (six) hours as needed.    Historical Provider, MD  loratadine (CLARITIN) 10 MG tablet Take 10 mg by mouth daily.    Historical Provider, MD  predniSONE (DELTASONE) 5 MG tablet Take 1 tablet (5 mg total) by mouth daily with breakfast. 12/26/14   Donne Hazel, MD  traMADol-acetaminophen (ULTRACET) 37.5-325 MG per tablet Take 1-2 tablets by mouth every 4 (four) hours as needed for severe pain. 12/26/14   Donne Hazel, MD   Physical Exam: Filed Vitals:   01/06/15 1723 01/06/15 1730 01/06/15 1805 01/06/15 1823  BP: 141/76  143/78    Pulse:  113    Temp:   99.5 F (37.5 C)   TempSrc:   Rectal   Resp: 22     Height:    5\' 10"  (1.778 m)  Weight:    97.977 kg (216 lb)  SpO2: 97% 96%      Wt Readings from Last 3 Encounters:  01/06/15 97.977 kg (216 lb)  01/02/15 99.837 kg (220 lb 1.6 oz)  12/26/14 99.111 kg (218 lb 8 oz)    General:  Appears calm and comfortable Eyes: PERRL, normal lids, irises & conjunctiva ENT: grossly normal hearing, lips & tongue Neck: no LAD, masses or thyromegaly Cardiovascular: RRR, no m/r/g.+ LE edema. Respiratory: basilar crackles noted  bilaterally. Abdomen: soft, ntnd Skin: no rash or induration seen on limited exam Musculoskeletal: grossly normal tone BUE/BLE Psychiatric: grossly normal mood and affect, speech fluent and appropriate Neurologic: grossly non-focal.          Labs on Admission:  Basic Metabolic Panel:  Recent Labs Lab 01/02/15 1237 01/06/15 1609  NA 133* 132*  K 4.9 4.4  CL 102 102  CO2 22 22  GLUCOSE 124* 137*  BUN 22 19  CREATININE 1.00 1.08  CALCIUM 8.5 8.5   Liver Function Tests:  Recent Labs Lab 01/06/15 1609  AST 46*  ALT 37  ALKPHOS 136*  BILITOT 0.7  PROT 6.7  ALBUMIN 2.2*   No results for input(s): LIPASE, AMYLASE in the last 168 hours. No results for input(s): AMMONIA in the last 168 hours. CBC:  Recent Labs Lab 01/02/15 1237 01/06/15 1609  WBC 24.0* 24.4*  NEUTROABS 21.3* 21.7*  HGB 11.5* 11.9*  HCT 36.5* 37.0*  MCV 90.3 88.5  PLT 401* 278   Cardiac Enzymes: No results for input(s): CKTOTAL, CKMB, CKMBINDEX, TROPONINI in the last 168 hours.  BNP (last 3 results)  Recent Labs  12/17/14 1934 01/02/15 1241  BNP 38.6 66.6    ProBNP (last 3 results) No results for input(s): PROBNP in the last 8760 hours.  CBG: No results for input(s): GLUCAP in the last 168 hours.  Radiological Exams on Admission: Dg Chest 2 View  01/06/2015   CLINICAL DATA:  Shortness of breath and cough; recent pneumonia  EXAM: CHEST  2 VIEW  COMPARISON:  Chest radiograph January 02, 2015; chest CT January 02, 2015  FINDINGS: There is persistent bibasilar airspace disease. Lungs elsewhere clear. Heart is upper normal in size with pulmonary vascularity within normal limits. No adenopathy. There is upper thoracic levoscoliosis.  IMPRESSION: Bibasilar airspace disease consistent with pneumonia, not significantly changed compared to recent prior studies. No new opacity apparent.   Electronically Signed   By: Lowella Grip III M.D.   On: 01/06/2015 16:53      Assessment/Plan Active  Problems:   CAP (community acquired pneumonia)   Normocytic anemia   HCAP (healthcare-associated pneumonia)   Steroid-induced hyperglycemia   1. HCAP -will start on HCAP coverage antibiotics with vancocin and cefepime -will get sputum cultures -will get blood cultures ordered -oxygen as needed  2. Steroid Induced Hyperglycemia -monitor FSBS -insulin coverage as needed  3. 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 Positive RF -rheumatology follow up -started on IV steroids now -had positive RF noted on last admit   Code Status: Full Code (must indicate code status--if unknown  or must be presumed, indicate so) DVT Prophylaxis:Heparin Family Communication: None (indicate person spoken with, if applicable, with phone number if by telephone) Disposition Plan: Home (indicate anticipated LOS)  Time spent: 62min  Donato Studley A [facility] Pager [phone]

## 2015-01-06 NOTE — ED Notes (Signed)
Pt reports being seen here two weeks ago for same, having fever and pain all over, productive cough. Was diagnosed with pneumonia, reports having a fever since being discharged.

## 2015-01-06 NOTE — ED Provider Notes (Signed)
Patient complains of persistent cough, shortness of breath and pleuritic chest pain since leaving hospital as inpatient stay 1.5 weeks ago. On exam patient in no distress lungs with diffuse scant rails. Speaks in paragraphs. Heart tachycardic regular rhythm. Chest nontender. Bilateral lower extremities with 1+ pretibial pitting edema  Orlie Dakin, MD 01/06/15 1810

## 2015-01-06 NOTE — ED Provider Notes (Signed)
CSN: [id number]     Arrival date & time 01/06/15  1545 History   First MD Initiated Contact with Patient 01/06/15 1652     Chief Complaint  Patient presents with  . Fever  . Pneumonia     (Consider location/radiation/quality/duration/timing/severity/associated sxs/prior Treatment) Patient is a 74 y.o. male presenting with fever and pneumonia. The history is provided by the patient, medical records, the spouse and a relative. No language interpreter was used.  Fever Associated symptoms: chest pain, chills and cough   Associated symptoms: no diarrhea, no dysuria, no headaches, no nausea, no rash and no vomiting   Pneumonia Associated symptoms include chest pain, chills, coughing, diaphoresis, a fever and weakness. Pertinent negatives include no abdominal pain, fatigue, headaches, nausea, rash or vomiting.    BRYSE BILA is a 74 y.o. male  with a hx of arthritis, GERD, Sjogren's syndrome (recently diagnosed and currently on prednisone) presents to the Emergency Department from his PCP office complaining of gradual, persistent, progressively worsening shortness of breath, chest pain, cough, peripheral edema onset several weeks ago. Patient was discharged from the hospital on 12/25/2014 for a community-acquired pneumonia which was initially treated with Zithromax and Rocephin. He was seen again on 01/02/2015 for increased symptoms. At that time a CT scan showed persistent pneumonia but no PE and patient was again discharged home. He followed up with his primary care provider today with complaints of worsening symptoms who referred him here to the emergency department. He has not been on any further antibiotic treatment since his initial treatment with Zithromax and Rocephin.  Patient reports he is having significant difficulty sleeping at night due to rapid and shallow breathing. He reports fevers at home up to 101.7.  He reports persistent swelling in his legs, hands and arms with significant  associated arthralgias.  He denies headache, neck pain, neck stiffness, changes in vision, abdominal pain, nausea, vomiting, diarrhea, syncope, dysuria, rash.  Nothing seems to make his symptoms better or worse.  Patient with family at bedside he reports that he is a [place] veteran who was exposed to agent orange.  Past Medical History  Diagnosis Date  . Medical history non-contributory   . Arthritis   . Pneumonia   . GERD (gastroesophageal reflux disease)    Past Surgical History  Procedure Laterality Date  . Hemorroidectomy  1999  . Surgical procedure to remove a mole as a child Right Eye area    At around 46 years old   Family History  Problem Relation Age of Onset  . Hypertension Mother    History  Substance Use Topics  . Smoking status: Never Smoker   . Smokeless tobacco: Not on file  . Alcohol Use: No    Review of Systems  Constitutional: Positive for fever, chills and diaphoresis. Negative for appetite change, fatigue and unexpected weight change.  HENT: Negative for mouth sores.   Eyes: Negative for visual disturbance.  Respiratory: Positive for cough. Negative for chest tightness, shortness of breath and wheezing.   Cardiovascular: Positive for chest pain and leg swelling.  Gastrointestinal: Negative for nausea, vomiting, abdominal pain, diarrhea and constipation.  Endocrine: Negative for polydipsia, polyphagia and polyuria.  Genitourinary: Negative for dysuria, urgency, frequency and hematuria.  Musculoskeletal: Negative for back pain and neck stiffness.  Skin: Negative for rash.  Allergic/Immunologic: Negative for immunocompromised state.  Neurological: Positive for weakness. Negative for syncope, light-headedness and headaches.  Hematological: Does not bruise/bleed easily.  Psychiatric/Behavioral: Negative for sleep disturbance. The patient is not  nervous/anxious.       Allergies  Review of patient's allergies indicates no known allergies.  Home  Medications   Prior to Admission medications   Medication Sig Start Date End Date Taking? Authorizing Provider  cyanocobalamin 500 MCG tablet Take 1 tablet (500 mcg total) by mouth daily. 12/26/14   Donne Hazel, MD  ferrous sulfate 325 (65 FE) MG tablet Take 1 tablet (325 mg total) by mouth 2 (two) times daily with a meal. 12/26/14   Donne Hazel, MD  ibuprofen (ADVIL,MOTRIN) 200 MG tablet Take 200 mg by mouth every 6 (six) hours as needed.    Historical Provider, MD  loratadine (CLARITIN) 10 MG tablet Take 10 mg by mouth daily.    Historical Provider, MD  predniSONE (DELTASONE) 5 MG tablet Take 1 tablet (5 mg total) by mouth daily with breakfast. 12/26/14   Donne Hazel, MD  traMADol-acetaminophen (ULTRACET) 37.5-325 MG per tablet Take 1-2 tablets by mouth every 4 (four) hours as needed for severe pain. 12/26/14   Donne Hazel, MD   BP 143/78 mmHg  Pulse 113  Temp(Src) 99.5 F (37.5 C) (Rectal)  Resp 22  Ht 5\' 10"  (1.778 m)  Wt 216 lb (97.977 kg)  BMI 30.99 kg/m2  SpO2 96% Physical Exam  Constitutional: He appears well-developed and well-nourished. No distress.  Awake, alert, nontoxic appearance  HENT:  Head: Normocephalic and atraumatic.  Mouth/Throat: Oropharynx is clear and moist. No oropharyngeal exudate.  Eyes: Conjunctivae are normal. No scleral icterus.  Neck: Normal range of motion. Neck supple.  Cardiovascular: Regular rhythm, normal heart sounds and intact distal pulses.  Tachycardia present.   No murmur heard. Pulses:      Radial pulses are 2+ on the right side, and 2+ on the left side.  Tachycardia to 120s  Pulmonary/Chest: Accessory muscle usage present. Tachypnea noted. No respiratory distress. He has no wheezes. He has rales in the right middle field, the right lower field, the left middle field and the left lower field.  Equal chest expansion Patient with tachypnea and accessory muscle usage with rales in the bilateral lobes  Abdominal: Soft. Bowel sounds are  normal. He exhibits no mass. There is no tenderness. There is no rebound and no guarding.  Musculoskeletal: Normal range of motion. He exhibits no edema.  3+ pitting edema of the bilateral lower extremities from distal forefoot including toes to bilateral knees; edema also noted to the hands and distal forearms  Neurological: He is alert.  Speech is clear and goal oriented Moves extremities without ataxia  Skin: Skin is warm. He is diaphoretic.  Skin is hot to touch, patient mildly diaphoretic  Psychiatric: He has a normal mood and affect.  Nursing note and vitals reviewed.   ED Course  Procedures (including critical care time) Labs Review Labs Reviewed  CBC WITH DIFFERENTIAL/PLATELET - Abnormal; Notable for the following:    WBC 24.4 (*)    RBC 4.18 (*)    Hemoglobin 11.9 (*)    HCT 37.0 (*)    Neutrophils Relative % 89 (*)    Neutro Abs 21.7 (*)    Lymphocytes Relative 6 (*)    Monocytes Absolute 1.3 (*)    All other components within normal limits  COMPREHENSIVE METABOLIC PANEL - Abnormal; Notable for the following:    Sodium 132 (*)    Glucose, Bld 137 (*)    Albumin 2.2 (*)    AST 46 (*)    Alkaline Phosphatase 136 (*)  GFR calc non Af Amer 66 (*)    GFR calc Af Amer 77 (*)    All other components within normal limits  CULTURE, BLOOD (ROUTINE X 2)  CULTURE, BLOOD (ROUTINE X 2)  URINE CULTURE  BRAIN NATRIURETIC PEPTIDE  URINALYSIS, ROUTINE W REFLEX MICROSCOPIC  I-STAT CG4 LACTIC ACID, ED  I-STAT TROPOININ, ED    Imaging Review Dg Chest 2 View  01/06/2015   CLINICAL DATA:  Shortness of breath and cough; recent pneumonia  EXAM: CHEST  2 VIEW  COMPARISON:  Chest radiograph January 02, 2015; chest CT January 02, 2015  FINDINGS: There is persistent bibasilar airspace disease. Lungs elsewhere clear. Heart is upper normal in size with pulmonary vascularity within normal limits. No adenopathy. There is upper thoracic levoscoliosis.  IMPRESSION: Bibasilar airspace disease  consistent with pneumonia, not significantly changed compared to recent prior studies. No new opacity apparent.   Electronically Signed   By: Lowella Grip III M.D.   On: 01/06/2015 16:53     EKG Interpretation   Date/Time:  Monday January 06 2015 17:16:15 EDT Ventricular Rate:  111 PR Interval:  166 QRS Duration: 83 QT Interval:  308 QTC Calculation: 418 R Axis:   -32 Text Interpretation:  Sinus tachycardia Atrial premature complex Probable  left atrial enlargement Left axis deviation Abnormal R-wave progression,  late transition No significant change since last tracing Confirmed by  Winfred Leeds  MD, SAM [phone]) on 01/06/2015 5:23:02 PM      MDM   Final diagnoses:  Tachycardia  Dyspnea  Anasarca   HARDING MCGOURTY presents with persistent fevers, shortness of breath and pneumonia. At this time I believe the patient has failed outpatient treatment and will need readmission.  Patient is without hypoxia at rest however he does have shortness of breath at rest.  Tachycardia to 120s here in the emergency department at rest tachypnea and evidence of persistent pneumonia. Patient meets SIRS/sepsis criteria.  Will continue work-up.  Leukocytosis of 24.4 likely a combination of patient's persistent pneumonia and steroid usage.  Blood cultures obtained. Patient will be treated for healthcare acquired pneumonia at this time.  Will proceed with admission.  Normal lactic acid.    The patient was discussed with and seen by Dr. Winfred Leeds who agrees with the treatment plan.  6:24 PM Discussed with Dr. Humphrey Rolls who will evaluate for admission.  Jarrett Soho Seniya Stoffers, PA-C 01/06/15 [place], MD 01/07/15 [phone]

## 2015-01-06 NOTE — ED Notes (Signed)
Report attempted 

## 2015-01-06 NOTE — ED Notes (Signed)
Informed pt a urine sample is needed. Pt states he will try to provide sample soon.

## 2015-01-07 DIAGNOSIS — R609 Edema, unspecified: Secondary | ICD-10-CM | POA: Diagnosis present

## 2015-01-07 LAB — URINE CULTURE

## 2015-01-07 LAB — INFLUENZA PANEL BY PCR (TYPE A & B)
H1N1FLUPCR: NOT DETECTED
INFLAPCR: NEGATIVE
Influenza B By PCR: NEGATIVE

## 2015-01-07 LAB — EXPECTORATED SPUTUM ASSESSMENT W REFEX TO RESP CULTURE

## 2015-01-07 LAB — EXPECTORATED SPUTUM ASSESSMENT W GRAM STAIN, RFLX TO RESP C

## 2015-01-07 LAB — LEGIONELLA ANTIGEN, URINE

## 2015-01-07 MED ORDER — WHITE PETROLATUM GEL
Status: AC
  Administered 2015-01-07: 18:00:00
  Filled 2015-01-07: qty 1

## 2015-01-07 MED ORDER — PREDNISONE 10 MG PO TABS
10.0000 mg | ORAL_TABLET | Freq: Every day | ORAL | Status: DC
  Administered 2015-01-08 – 2015-01-09 (×2): 10 mg via ORAL
  Filled 2015-01-07 (×3): qty 1

## 2015-01-07 MED ORDER — FUROSEMIDE 10 MG/ML IJ SOLN
40.0000 mg | Freq: Two times a day (BID) | INTRAMUSCULAR | Status: DC
  Administered 2015-01-07 – 2015-01-08 (×3): 40 mg via INTRAVENOUS
  Filled 2015-01-07 (×4): qty 4

## 2015-01-07 NOTE — Progress Notes (Signed)
INITIAL NUTRITION ASSESSMENT  DOCUMENTATION CODES Per approved criteria  -Obesity Unspecified   INTERVENTION: -Continue Ensure Enlive po BID, each supplement provides 350 kcal and 20 grams of protein  NUTRITION DIAGNOSIS: Inadequate oral intake related to decreased appetite as evidenced by PO: 50%.   Goal: Pt will meet >90% of estimated nutritional needs  Monitor:  PO/supplement intake, labs, weight changes, I/O's  Reason for Assessment: MST=2  74 y.o. male  Admitting Dx: <principal problem not specified>  Travis [name] is a 74 y.o. male recentlyu discharged after admission for a community acquired pneumonia. He comes in now after seeing his PCP. He states that he has been having difficulty breathing and states that he has had cough and congestion. He has been bringing up sputum which is clear. He states there has been no recent blood in his sputum. He has no chest pain noted. He states he had stayed in the hospital for almost a week. Patient received a 7 day course of Rocephin and Azithromycin. He was also to see a Rheumatologist which he has not yet done as his appointment is in May. He was prescribed prednisone. He states that he has noted his feet to be more swollen. He has no prior history of CHF. No recent echo. Patient states he has been very active and has been athletic all his life. This is very unusual for him to be short of breath and not able to do anything.   ASSESSMENT: Pt admitted with pneumonia. Attempted to visit with pt x 2, however, in with MD at times of visits.  RD saw pt during previous admission. Pt continues to have a decreased appetite. Noted 50% meal completion. Wt has remained stable since last admission. Unable to complete nutrition-focused physical exam at this time, but previous admission revealed exam was within [facility] except for mild wasting in orbital, clavicle, and thigh region. RD expects no changes to exam at this time.  Pt has Ensure supplement  already ordered. RD will continue with supplement order to promote nutritional adequacy. Labs reviewed. Na: 137, Glucose: 132.   Height: Ht Readings from Last 1 Encounters:  01/06/15 5\' 10"  (1.778 m)    Weight: Wt Readings from Last 1 Encounters:  01/06/15 216 lb (97.977 kg)    Ideal Body Weight: 166#  % Ideal Body Weight: 130%  Wt Readings from Last 10 Encounters:  01/06/15 216 lb (97.977 kg)  01/02/15 220 lb 1.6 oz (99.837 kg)  12/26/14 218 lb 8 oz (99.111 kg)    Usual Body Weight: 229#  % Usual Body Weight: 94%  BMI:  Body mass index is 30.99 kg/(m^2). Obesity, class I  Estimated Nutritional Needs: Kcal: 1800-2000 Protein: 90-100 grams Fluid: 1.8-2.0 L  Skin: Intact  Diet Order: Diet Carb Modified Fluid consistency:: Thin; Room service appropriate?: Yes  EDUCATION NEEDS: -Education not appropriate at this time   Intake/Output Summary (Last 24 hours) at 01/07/15 1422 Last data filed at 01/07/15 1410  Gross per 24 hour  Intake   1650 ml  Output    950 ml  Net    700 ml    Last BM: 01/06/15   Labs:   Recent Labs Lab 01/02/15 1237 01/06/15 1609  NA 133* 132*  K 4.9 4.4  CL 102 102  CO2 22 22  BUN 22 19  CREATININE 1.00 1.08  CALCIUM 8.5 8.5  GLUCOSE 124* 137*    CBG (last 3)  No results for input(s): GLUCAP in the last 72 hours.  Scheduled Meds: . ceFEPime (MAXIPIME) IV  2 g Intravenous Q12H  . cyanocobalamin  500 mcg Oral Daily  . feeding supplement (ENSURE ENLIVE)  237 mL Oral BID BM  . ferrous sulfate  325 mg Oral BID WC  . heparin  5,000 Units Subcutaneous 3 times per day  . loratadine  10 mg Oral Daily  . methylPREDNISolone (SOLU-MEDROL) injection  60 mg Intravenous Q6H  . vancomycin  1,250 mg Intravenous Q12H    Continuous Infusions: . sodium chloride 50 mL/hr at 01/06/15 2216    Past Medical History  Diagnosis Date  . Medical history non-contributory   . Arthritis   . Pneumonia   . GERD (gastroesophageal reflux disease)      Past Surgical History  Procedure Laterality Date  . Hemorroidectomy  1999  . Surgical procedure to remove a mole as a child Right Eye area    At around 12 years old    Amaan Meyer A. Jimmye Norman, RD, LDN, CDE Pager: [phone] After hours Pager: [phone]

## 2015-01-07 NOTE — Progress Notes (Signed)
[name]  Travis [name] [id number] DOB: [birth year] DOA: 01/06/2015 PCP: Travis Amel, MD  Assessment/Plan:  Principal Problem:   HCAP (healthcare-associated pneumonia)?  On vancomycin and cefepime. Reviewed old chart. Had extensive workup. Saw PCCM last admission. 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  Will check procalcitonin.  If low, may not need abx. Has been on prednisone taper. I suspect worsening symptoms may be related to taper. Pt was on 5 mg prednisone from what I can tell. Symptoms improved with solumedrol. Will change to pred 10 and monitor.  RF, anti ro, anti la, ANCA proteinase 3 positive. Has f/u with Dr. 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  TSH and echo ok last admission.   Code Status:  full Family Communication:  Wife and daughter at bedside Disposition Plan:  Eventually home  Consultants:    Procedures:     Antibiotics:  Vanc, cefepime 4/25 --  HPI/Subjective: Less pain and SOB. Has swelling. Frustrated he's not getting better.  Objective: Filed Vitals:   01/07/15 2155  BP: 155/80  Pulse: 100  Temp: 98.7 F (37.1 C)  Resp: 18    Intake/Output Summary (Last 24 hours) at 01/07/15 2232 Last data filed at 01/07/15 1900  Gross per 24 hour  Intake 1446.67 ml  Output   1525 ml  Net -78.33 ml   Filed Weights   01/06/15 1601 01/06/15 1823 01/06/15 2030  Weight: 97.977 kg (216 lb) 97.977 kg (216 lb) 97.977 kg (216 lb)    Exam:   General:  Comfortable supine  Cardiovascular: RRR without MGR  Respiratory: CTA without WRR  Abdomen: S, NT, ND  Ext: 2 plus edema in legs. Hands swollen  Basic Metabolic Panel:  Recent Labs Lab 01/02/15 1237 01/06/15 1609  NA 133* 132*  K 4.9 4.4  CL 102 102  CO2 22 22  GLUCOSE 124* 137*  BUN 22 19  CREATININE 1.00 1.08  CALCIUM 8.5 8.5   Liver Function  Tests:  Recent Labs Lab 01/06/15 1609  AST 46*  ALT 37  ALKPHOS 136*  BILITOT 0.7  PROT 6.7  ALBUMIN 2.2*   No results for input(s): LIPASE, AMYLASE in the last 168 hours. No results for input(s): AMMONIA in the last 168 hours. CBC:  Recent Labs Lab 01/02/15 1237 01/06/15 1609  WBC 24.0* 24.4*  NEUTROABS 21.3* 21.7*  HGB 11.5* 11.9*  HCT 36.5* 37.0*  MCV 90.3 88.5  PLT 401* 278   Cardiac Enzymes: No results for input(s): CKTOTAL, CKMB, CKMBINDEX, TROPONINI in the last 168 hours. BNP (last 3 results)  Recent Labs  12/17/14 1934 01/02/15 1241 01/06/15 1758  BNP 38.6 66.6 83.3    ProBNP (last 3 results) No results for input(s): PROBNP in the last 8760 hours.  CBG: No results for input(s): GLUCAP in the last 168 hours.  Recent Results (from the past 240 hour(s))  Culture, expectorated sputum-assessment     Status: None   Collection Time: 01/07/15  9:55 AM  Result Value Ref Range Status   Specimen Description SPUTUM  Final   Special Requests NONE  Final   Sputum evaluation   Final    MICROSCOPIC FINDINGS SUGGEST THAT THIS SPECIMEN IS NOT REPRESENTATIVE OF LOWER RESPIRATORY SECRETIONS. PLEASE RECOLLECT. Gram Stain Report Called to,Read Back By and Verified With: Vale Haven AT 1336 01/07/15 BY K BARR    Report Status 01/07/2015 FINAL  Final     Studies: Dg Chest 2 View  01/06/2015   CLINICAL DATA:  Shortness of breath and cough; recent pneumonia  EXAM: CHEST  2 VIEW  COMPARISON:  Chest radiograph January 02, 2015; chest CT January 02, 2015  FINDINGS: There is persistent bibasilar airspace disease. Lungs elsewhere clear. Heart is upper normal in size with pulmonary vascularity within normal limits. No adenopathy. There is upper thoracic levoscoliosis.  IMPRESSION: Bibasilar airspace disease consistent with pneumonia, not significantly changed compared to recent prior studies. No new opacity apparent.   Electronically Signed   By: Lowella Grip III M.D.   On:  01/06/2015 16:53    Scheduled Meds: . ceFEPime (MAXIPIME) IV  2 g Intravenous Q12H  . cyanocobalamin  500 mcg Oral Daily  . feeding supplement (ENSURE ENLIVE)  237 mL Oral BID BM  . ferrous sulfate  325 mg Oral BID WC  . furosemide  40 mg Intravenous BID  . heparin  5,000 Units Subcutaneous 3 times per day  . loratadine  10 mg Oral Daily  . [START ON 01/08/2015] predniSONE  10 mg Oral Q breakfast  . vancomycin  1,250 mg Intravenous Q12H   Continuous Infusions: . sodium chloride 10 mL/hr at 01/07/15 1600    Time spent: 35 minutes  [facility] Pager [phone]. If 7PM-7AM, please contact night-coverage at [email or web address], password [facility] 01/07/2015, 10:32 PM  LOS: 1 day

## 2015-01-08 LAB — BASIC METABOLIC PANEL
Anion gap: 10 (ref 5–15)
BUN: 28 mg/dL — ABNORMAL HIGH (ref 6–23)
CALCIUM: 8.4 mg/dL (ref 8.4–10.5)
CO2: 20 mmol/L (ref 19–32)
Chloride: 104 mmol/L (ref 96–112)
Creatinine, Ser: 1.09 mg/dL (ref 0.50–1.35)
GFR calc Af Amer: 76 mL/min — ABNORMAL LOW (ref >90)
GFR calc non Af Amer: 65 mL/min — ABNORMAL LOW (ref >90)
GLUCOSE: 120 mg/dL — AB (ref 70–99)
Potassium: 4.3 mmol/L (ref 3.5–5.1)
SODIUM: 134 mmol/L — AB (ref 135–145)

## 2015-01-08 LAB — HIV ANTIBODY (ROUTINE TESTING W REFLEX): HIV Screen 4th Generation wRfx: NONREACTIVE

## 2015-01-08 NOTE — Care Management Note (Signed)
    Page 1 of 1   01/09/2015     6:23:10 PM CARE MANAGEMENT NOTE 01/09/2015  Patient:  Travis [name], Travis [name]   Account Number:  [id number]  Date Initiated:  01/08/2015  Documentation initiated by:  Tomi Bamberger  Subjective/Objective Assessment:   dx hcap  admit- lives with wife.     Action/Plan:   pt eval-   Anticipated DC Date:  01/09/2015   Anticipated DC Plan:  [place]  CM consult      Choice offered to / List presented to:             Status of service:  Completed, signed off Medicare Important Message given?  YES (If response is "NO", the following Medicare IM given date fields will be blank) Date Medicare IM given:  01/08/2015 Medicare IM given by:  Tomi Bamberger Date Additional Medicare IM given:   Additional Medicare IM given by:    Discharge Disposition:  HOME/SELF CARE  Per UR Regulation:  Reviewed for med. necessity/level of care/duration of stay  If discussed at [place] of Stay Meetings, dates discussed:    Comments:  01/08/15 [place], BSN [phone] patient lives with wife, await pt eval, rec cardiopulmonary rehab.

## 2015-01-08 NOTE — Progress Notes (Signed)
[name]  CHRISTAPHER WELSER [id number] DOB: 1941-03-28 DOA: 01/06/2015 PCP: [name], MD  Assessment/Plan:  Principal Problem:   HCAP (healthcare-associated pneumonia)?  On vancomycin and cefepime. Reviewed old chart. Had extensive workup earlier this month. Saw PCCM last admission. 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   -procalcitonin pending.  If low, may not need abx. Has been on prednisone taper. I suspect worsening symptoms may be related to taper. Pt appears to have been on 5 mg. Symptoms improved with solumedrol.  Placed on prednisone 10mg  and monitor.  RF, anti ro, anti la, ANCA proteinase 3 positive. Has f/u with Dr. Katherine Basset in May as well as Dr. Maple Mirza    Normocytic anemia   Steroid-induced hyperglycemia: on ssi   Peripheral edema: iv lasix bid x3 days and monitor.  TSH and echo ok last admission.   Code Status:  full Family Communication:  Wife  at bedside Disposition Plan:  Eventually home- PT eval with home O2  Consultants:    Procedures:       HPI/Subjective: Much improved today per patient   Objective: Filed Vitals:   01/08/15 0505  BP: 149/75  Pulse: 103  Temp: 98.3 F (36.8 C)  Resp: 17    Intake/Output Summary (Last 24 hours) at 01/08/15 0850 Last data filed at 01/08/15 0507  Gross per 24 hour  Intake 1326.67 ml  Output   1625 ml  Net -298.33 ml   Filed Weights   01/06/15 1601 01/06/15 1823 01/06/15 2030  Weight: 97.977 kg (216 lb) 97.977 kg (216 lb) 97.977 kg (216 lb)    Exam:   General:  Comfortable- on no O2  Cardiovascular: RRR without MGR  Respiratory: CTA without WRR  Abdomen: S, NT, ND  Ext: + edema  Basic Metabolic Panel:  Recent Labs Lab 01/02/15 1237 01/06/15 1609 01/08/15 0521  NA 133* 132* 134*  K 4.9 4.4 4.3  CL 102 102 104  CO2 22 22 20   GLUCOSE 124* 137* 120*  BUN 22 19 28*  CREATININE 1.00 1.08 1.09  CALCIUM 8.5 8.5 8.4   Liver Function Tests:  Recent  Labs Lab 01/06/15 1609  AST 46*  ALT 37  ALKPHOS 136*  BILITOT 0.7  PROT 6.7  ALBUMIN 2.2*   No results for input(s): LIPASE, AMYLASE in the last 168 hours. No results for input(s): AMMONIA in the last 168 hours. CBC:  Recent Labs Lab 01/02/15 1237 01/06/15 1609  WBC 24.0* 24.4*  NEUTROABS 21.3* 21.7*  HGB 11.5* 11.9*  HCT 36.5* 37.0*  MCV 90.3 88.5  PLT 401* 278   Cardiac Enzymes: No results for input(s): CKTOTAL, CKMB, CKMBINDEX, TROPONINI in the last 168 hours. BNP (last 3 results)  Recent Labs  12/17/14 1934 01/02/15 1241 01/06/15 1758  BNP 38.6 66.6 83.3    ProBNP (last 3 results) No results for input(s): PROBNP in the last 8760 hours.  CBG: No results for input(s): GLUCAP in the last 168 hours.  Recent Results (from the past 240 hour(s))  Blood Culture (routine x 2)     Status: None (Preliminary result)   Collection Time: 01/06/15  6:25 PM  Result Value Ref Range Status   Specimen Description BLOOD LEFT ARM  Final   Special Requests BOTTLES DRAWN AEROBIC AND ANAEROBIC 5CC  Final   Culture   Final           BLOOD CULTURE RECEIVED NO GROWTH TO DATE CULTURE WILL BE HELD FOR 5 DAYS BEFORE ISSUING A FINAL  NEGATIVE REPORT Performed at [facility]    Report Status PENDING  Incomplete  Blood Culture (routine x 2)     Status: None (Preliminary result)   Collection Time: 01/06/15  6:40 PM  Result Value Ref Range Status   Specimen Description BLOOD LEFT HAND  Final   Special Requests BOTTLES DRAWN AEROBIC AND ANAEROBIC 5CC  Final   Culture   Final           BLOOD CULTURE RECEIVED NO GROWTH TO DATE CULTURE WILL BE HELD FOR 5 DAYS BEFORE ISSUING A FINAL NEGATIVE REPORT Performed at [facility]    Report Status PENDING  Incomplete  Urine culture     Status: None   Collection Time: 01/06/15  6:42 PM  Result Value Ref Range Status   Specimen Description URINE, CLEAN CATCH  Final   Special Requests NONE  Final   Colony Count   Final     25,000 COLONIES/ML Performed at [facility]    Culture   Final    Multiple bacterial morphotypes present, none predominant. Suggest appropriate recollection if clinically indicated. Performed at [facility]    Report Status 01/07/2015 FINAL  Final  Culture, expectorated sputum-assessment     Status: None   Collection Time: 01/07/15  9:55 AM  Result Value Ref Range Status   Specimen Description SPUTUM  Final   Special Requests NONE  Final   Sputum evaluation   Final    MICROSCOPIC FINDINGS SUGGEST THAT THIS SPECIMEN IS NOT REPRESENTATIVE OF LOWER RESPIRATORY SECRETIONS. PLEASE RECOLLECT. Gram Stain Report Called to,Read Back By and Verified With: Vale Haven AT [id number] 01/07/15 BY K BARR    Report Status 01/07/2015 FINAL  Final     Studies: Dg Chest 2 View  01/06/2015   CLINICAL DATA:  Shortness of breath and cough; recent pneumonia  EXAM: CHEST  2 VIEW  COMPARISON:  Chest radiograph January 02, 2015; chest CT January 02, 2015  FINDINGS: There is persistent bibasilar airspace disease. Lungs elsewhere clear. Heart is upper normal in size with pulmonary vascularity within normal limits. No adenopathy. There is upper thoracic levoscoliosis.  IMPRESSION: Bibasilar airspace disease consistent with pneumonia, not significantly changed compared to recent prior studies. No new opacity apparent.   Electronically Signed   By: Lowella Grip III M.D.   On: 01/06/2015 16:53    Scheduled Meds: . ceFEPime (MAXIPIME) IV  2 g Intravenous Q12H  . cyanocobalamin  500 mcg Oral Daily  . feeding supplement (ENSURE ENLIVE)  237 mL Oral BID BM  . ferrous sulfate  325 mg Oral BID WC  . furosemide  40 mg Intravenous BID  . heparin  5,000 Units Subcutaneous 3 times per day  . loratadine  10 mg Oral Daily  . predniSONE  10 mg Oral Q breakfast  . vancomycin  1,250 mg Intravenous Q12H   Continuous Infusions: . sodium chloride 10 mL/hr at 01/07/15 1600    Time spent: 25 minutes  Eulogio Bear  [facility] Pager [phone]. If 7PM-7AM, please contact night-coverage at [email or web address], password [facility] 01/08/2015, 8:50 AM  LOS: 2 days

## 2015-01-08 NOTE — Evaluation (Signed)
Physical Therapy Evaluation Patient Details Name: Travis [name] MRN: [id number] DOB: March 23, 1941 Today's Date: 01/08/2015   History of Present Illness  Pt readmitted with pneumonia after recent admission earlier this month, pneumonia as well; previous workup revealed positive RF, and he is beign worked up for autoimmune dysfunction; increase in sumptoms this admission thought to be related to the tapering stange of hi ssteroid taper; pt also with steriod induced hyperglycemia, normocytic anemia  Clinical Impression   Pt admitted with above diagnosis. Pt currently with functional limitations due to the deficits listed below (see PT Problem List).  Pt will benefit from skilled PT to increase their independence and safety with mobility to allow discharge to the venue listed below.    Pt ambulated on Room Air; During walk, O2 sats ranged form 92-100%; HR elevated, range 118-132bpm;  Interestingly, his O2 sats dropped to 87% while sitting in chair immediately after walking, and increased back to 95% or higher within 5 minutes of seated rest (without the need for extra supplemental O2);  PT to follow while in-hospital to watch HR and O2 sat response to activity, and make or update recs for supplemental O2 as necessary     Follow Up Recommendations Other (comment) (Outpt Cardiac/pulmonary rehab)    Equipment Recommendations  None recommended by PT    Recommendations for Other Services       Precautions / Restrictions Precautions Precaution Comments: monitor O2 sats and HR      Mobility  Bed Mobility                  Transfers Overall transfer level: Needs assistance Equipment used: Rolling walker (2 wheeled) Transfers: Sit to/from Stand Sit to Stand: Supervision         General transfer comment: Cues for technique; good rise  Ambulation/Gait Ambulation/Gait assistance: Supervision Ambulation Distance (Feet): 500 Feet Assistive device: Rolling walker (2  wheeled) Gait Pattern/deviations: Step-through pattern Gait velocity: approaching normal   General Gait Details: Good use of RW to decr the work of walking; cues to self-monitor for activity tolerance  Stairs            Wheelchair Mobility    Modified Rankin (Stroke Patients Only)       Balance                                             Pertinent Vitals/Pain Pain Assessment: No/denies pain    Home Living Family/patient expects to be discharged to:: Private residence Living Arrangements: Spouse/significant other Available Help at Discharge: Family;Available 24 hours/day Type of Home: House Home Access: Stairs to enter Entrance Stairs-Rails: None Entrance Stairs-Number of Steps: 6 Home Layout: One level Home Equipment: None      Prior Function Level of Independence: Independent         Comments: Pt retired 4 weeks ago.  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       Hand Dominance   Dominant Hand: Left    Extremity/Trunk Assessment   Upper Extremity Assessment: Overall WFL for tasks assessed           Lower Extremity Assessment: Overall WFL for tasks assessed (though fatigues with hallway amb)      Cervical / Trunk Assessment: Kyphotic  Communication   Communication: No difficulties  Cognition Arousal/Alertness: Awake/alert Behavior During Therapy: WFL for tasks assessed/performed  Overall Cognitive Status: Within Functional Limits for tasks assessed                      General Comments      Exercises        Assessment/Plan    PT Assessment Patient needs continued PT services  PT Diagnosis Generalized weakness (decr functional capacity/ decr activity tolerance)   PT Problem List Decreased activity tolerance;Decreased knowledge of use of DME;Decreased knowledge of precautions;Cardiopulmonary status limiting activity  PT Treatment Interventions DME instruction;Gait training;Functional  mobility training;Therapeutic activities;Therapeutic exercise;Balance training;Stair training;Patient/family education   PT Goals (Current goals can be found in the Care Plan section) Acute Rehab PT Goals Patient Stated Goal: to go home PT Goal Formulation: With patient Time For Goal Achievement: 01/22/15 Potential to Achieve Goals: Good    Frequency Min 3X/week   Barriers to discharge        Co-evaluation               End of Session Equipment Utilized During Treatment:  (pulse oximeter) Activity Tolerance: Patient limited by fatigue Patient left: in chair;with call bell/phone within reach;with family/visitor present (eating breakfast) Nurse Communication: Mobility status         Time: [id number] PT Time Calculation (min) (ACUTE ONLY): 31 min   Charges:   PT Evaluation $Initial PT Evaluation Tier I: 1 Procedure PT Treatments $Gait Training: 8-22 mins   PT G Codes:        Quin Hoop 01/08/2015, 9:38 AM  Roney Marion, [place] Pager [phone] Office [phone]

## 2015-01-09 DIAGNOSIS — J849 Interstitial pulmonary disease, unspecified: Secondary | ICD-10-CM

## 2015-01-09 DIAGNOSIS — R0602 Shortness of breath: Secondary | ICD-10-CM

## 2015-01-09 LAB — RESPIRATORY VIRUS PANEL
Adenovirus: NEGATIVE
Influenza A: NEGATIVE
Influenza B: NEGATIVE
Metapneumovirus: NEGATIVE
PARAINFLUENZA 1 A: NEGATIVE
Parainfluenza 2: NEGATIVE
Parainfluenza 3: NEGATIVE
RESPIRATORY SYNCYTIAL VIRUS A: NEGATIVE
RESPIRATORY SYNCYTIAL VIRUS B: NEGATIVE
RHINOVIRUS: NEGATIVE

## 2015-01-09 LAB — ADENOVIRUS ANTIBODIES: Adenovirus Antibody: 1:8 {titer} — ABNORMAL HIGH

## 2015-01-09 LAB — CBC
HEMATOCRIT: 35.7 % — AB (ref 39.0–52.0)
Hemoglobin: 11.4 g/dL — ABNORMAL LOW (ref 13.0–17.0)
MCH: 28.7 pg (ref 26.0–34.0)
MCHC: 31.9 g/dL (ref 30.0–36.0)
MCV: 89.9 fL (ref 78.0–100.0)
Platelets: 172 10*3/uL (ref 150–400)
RBC: 3.97 MIL/uL — ABNORMAL LOW (ref 4.22–5.81)
RDW: 15.7 % — AB (ref 11.5–15.5)
WBC: 20.8 10*3/uL — ABNORMAL HIGH (ref 4.0–10.5)

## 2015-01-09 LAB — BASIC METABOLIC PANEL
Anion gap: 10 (ref 5–15)
BUN: 31 mg/dL — ABNORMAL HIGH (ref 6–23)
CALCIUM: 8.3 mg/dL — AB (ref 8.4–10.5)
CO2: 23 mmol/L (ref 19–32)
Chloride: 104 mmol/L (ref 96–112)
Creatinine, Ser: 1.39 mg/dL — ABNORMAL HIGH (ref 0.50–1.35)
GFR calc Af Amer: 56 mL/min — ABNORMAL LOW (ref >90)
GFR, EST NON AFRICAN AMERICAN: 49 mL/min — AB (ref >90)
Glucose, Bld: 126 mg/dL — ABNORMAL HIGH (ref 70–99)
Potassium: 3.8 mmol/L (ref 3.5–5.1)
Sodium: 137 mmol/L (ref 135–145)

## 2015-01-09 LAB — PROCALCITONIN: Procalcitonin: 0.65 ng/mL

## 2015-01-09 MED ORDER — SODIUM CHLORIDE 0.9 % IV BOLUS (SEPSIS)
250.0000 mL | Freq: Once | INTRAVENOUS | Status: AC
  Administered 2015-01-09: 250 mL via INTRAVENOUS

## 2015-01-09 MED ORDER — ENSURE ENLIVE PO LIQD: 237.0000 mL | Freq: Two times a day (BID) | ORAL | Status: DC

## 2015-01-09 MED ORDER — NYSTATIN 100000 UNIT/ML MT SUSP
5.0000 mL | Freq: Four times a day (QID) | OROMUCOSAL | Status: DC
  Administered 2015-01-09: 500000 [IU] via ORAL
  Filled 2015-01-09 (×3): qty 5

## 2015-01-09 MED ORDER — NYSTATIN 100000 UNIT/ML MT SUSP: 5.0000 mL | Freq: Four times a day (QID) | OROMUCOSAL | Status: DC

## 2015-01-09 MED ORDER — PREDNISONE 10 MG PO TABS: 10.0000 mg | ORAL_TABLET | Freq: Every day | ORAL | Status: DC

## 2015-01-09 NOTE — Progress Notes (Signed)
Nsg Discharge Note  Admit Date:  01/06/2015 Discharge date: 01/09/2015   Travis [name] to be D/C'd Home per MD order.  AVS completed.  Copy for chart, and copy for patient signed, and dated. Patient/caregiver able to verbalize understanding.  Discharge Medication:   Medication List    TAKE these medications        cyanocobalamin 500 MCG tablet  Take 1 tablet (500 mcg total) by mouth daily.     feeding supplement (ENSURE ENLIVE) Liqd  Take 237 mLs by mouth 2 (two) times daily between meals.     ferrous sulfate 325 (65 FE) MG tablet  Take 1 tablet (325 mg total) by mouth 2 (two) times daily with a meal.     loratadine 10 MG tablet  Commonly known as:  CLARITIN  Take 10 mg by mouth daily.     nystatin 100000 UNIT/ML suspension  Commonly known as:  MYCOSTATIN  Take 5 mLs (500,000 Units total) by mouth 4 (four) times daily. For 7 days     predniSONE 10 MG tablet  Commonly known as:  DELTASONE  Take 1 tablet (10 mg total) by mouth daily with breakfast.     traMADol-acetaminophen 37.5-325 MG per tablet  Commonly known as:  ULTRACET  Take 1-2 tablets by mouth every 4 (four) hours as needed for severe pain.        Discharge Assessment: Filed Vitals:   01/09/15 1347  BP: 134/72  Pulse: 104  Temp: 98.7 F (37.1 C)  Resp: 20   Skin clean, dry and intact without evidence of skin break down, no evidence of skin tears noted. IV catheter discontinued intact. Site without signs and symptoms of complications - no redness or edema noted at insertion site, patient denies c/o pain - only slight tenderness at site.  Dressing with slight pressure applied.  D/c Instructions-Education: Discharge instructions given to patient/family with verbalized understanding. D/c education completed with patient/family including follow up instructions, medication list, d/c activities limitations if indicated, with other d/c instructions as indicated by MD - patient able to verbalize understanding,  all questions fully answered. Patient instructed to return to ED, call 911, or call MD for any changes in condition.  Patient escorted via [place], and D/C home via private auto.  Dayle Points, RN 01/09/2015 3:55 PM

## 2015-01-09 NOTE — Discharge Summary (Signed)
Physician Discharge Summary  Travis [name] [id number] DOB: 14-Apr-1941 DOA: 01/06/2015  PCP: [name], MD  Admit date: 01/06/2015 Discharge date: 01/09/2015  Time spent: 35 minutes  Recommendations for Outpatient Follow-up:  Bmp 1 week Needs to keep appointment with pulm/rheum Will defer steroid taper to pulm/rheum  Discharge Diagnoses:  Principal Problem:   HCAP (healthcare-associated pneumonia) Active Problems:   Normocytic anemia   Steroid-induced hyperglycemia   Peripheral edema   Discharge Condition: improved  Diet recommendation: cardiac  Filed Weights   01/06/15 1601 01/06/15 1823 01/06/15 2030  Weight: 97.977 kg (216 lb) 97.977 kg (216 lb) 97.977 kg (216 lb)    History of present illness:  Travis [name] is a 74 y.o. male recently discharged after admission for a community acquired pneumonia. He comes in now after seeing his PCP. He states that he has been having difficulty breathing and states that he has had cough and congestion. He has been bringing up sputum which is clear. He states there has been no recent blood in his sputum. He has no chest pain noted. He states he had stayed in the hospital for almost a week. Patient received a 7 day course of Rocephin and Azithromycin. He was also to see a Rheumatologist which he has not yet done as his appointment is in May. He was prescribed prednisone. He states that he has noted his feet to be more swollen. He has no prior history of CHF. No recent echo. Patient states he has been very active and has been athletic all his life. This is very unusual for him to be short of breath and not able to do anything.   Hospital Course:  HCAP (healthcare-associated pneumonia)? On vancomycin and cefepime-d/c'd as procalcitonin low Reviewed old chart. Had extensive workup earlier this month. Saw PCCM last admission. 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  Has been on prednisone taper. I suspect worsening symptoms may be  related to taper. Pt appears to have been on 5 mg.  Placed on prednisone 10mg  and monitor. RF, anti ro, anti la, ANCA proteinase 3 positive. Has f/u with Dr. Katherine Basset in May as well as Dr. Trudie Reed   Normocytic anemia  Steroid-induced hyperglycemia: on ssi  Peripheral edema:from steroids?. TSH and echo ok last admission.  Procedures:    Consultations:    Discharge Exam: Filed Vitals:   01/09/15 1347  BP: 134/72  Pulse: 104  Temp: 98.7 F (37.1 C)  Resp: 20    General: awake, not requiring O2 Cardiovascular: rrr Respiratory: clear  Discharge Instructions   Discharge Instructions    Diet - low sodium heart healthy    Complete by:  As directed      Discharge instructions    Complete by:  As directed   Keep appointments with pulm and rheum Keep feet elevated     Increase activity slowly    Complete by:  As directed           Current Discharge Medication List    START taking these medications   Details  feeding supplement, ENSURE ENLIVE, (ENSURE ENLIVE) LIQD Take 237 mLs by mouth 2 (two) times daily between meals. Qty: 237 mL, Refills: 12    nystatin (MYCOSTATIN) 100000 UNIT/ML suspension Take 5 mLs (500,000 Units total) by mouth 4 (four) times daily. For 7 days Qty: 60 mL, Refills: 0      CONTINUE these medications which have CHANGED   Details  predniSONE (DELTASONE) 10 MG tablet Take 1 tablet (10 mg total)  by mouth daily with breakfast. Qty: 30 tablet, Refills: 0      CONTINUE these medications which have NOT CHANGED   Details  cyanocobalamin 500 MCG tablet Take 1 tablet (500 mcg total) by mouth daily. Qty: 30 tablet, Refills: 0    ferrous sulfate 325 (65 FE) MG tablet Take 1 tablet (325 mg total) by mouth 2 (two) times daily with a meal. Qty: 60 tablet, Refills: 0    loratadine (CLARITIN) 10 MG tablet Take 10 mg by mouth daily.    traMADol-acetaminophen (ULTRACET) 37.5-325 MG per tablet Take 1-2 tablets by mouth every 4 (four) hours as needed  for severe pain. Qty: 30 tablet, Refills: 0       No Known Allergies Follow-up Information    Follow up with [name], MD In 1 week.   Specialty:  Family Medicine   Why:  for [facility]   Contact information:   [address] [place] [address] [phone]        The results of significant diagnostics from this hospitalization (including imaging, microbiology, ancillary and laboratory) are listed below for reference.    Significant Diagnostic Studies: Dg Chest 2 View  01/06/2015   CLINICAL DATA:  Shortness of breath and cough; recent pneumonia  EXAM: CHEST  2 VIEW  COMPARISON:  Chest radiograph January 02, 2015; chest CT January 02, 2015  FINDINGS: There is persistent bibasilar airspace disease. Lungs elsewhere clear. Heart is upper normal in size with pulmonary vascularity within normal limits. No adenopathy. There is upper thoracic levoscoliosis.  IMPRESSION: Bibasilar airspace disease consistent with pneumonia, not significantly changed compared to recent prior studies. No new opacity apparent.   Electronically Signed   By: Lowella Grip III M.D.   On: 01/06/2015 16:53   Dg Chest 2 View  01/02/2015   CLINICAL DATA:  Chest pain, shortness of breath, swelling of the legs  EXAM: CHEST  2 VIEW  COMPARISON:  Portable chest x-ray of 12/25/2014  FINDINGS: Bibasilar parenchymal opacities remain. With little interval change, these opacities most likely represent pulmonary fibrosis. No effusion is noted. Cardiomegaly is stable. No acute bony abnormality is seen.  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 No definite effusion. Stable cardiomegaly.   Electronically Signed   By: Ivar Drape M.D.   On: 01/02/2015 14:52   Dg Chest 2 View  12/21/2014   CLINICAL DATA:  Pneumonia  EXAM: CHEST  2 VIEW  COMPARISON:  12/19/2014  FINDINGS: Cardiomediastinal silhouette is stable. Hyperinflation again noted. Again noted fibrotic changes lung bases. Streaky  bilateral basilar atelectasis or infiltrate without change from prior exam. No pulmonary edema.  IMPRESSION: No pulmonary edema. Hyperinflation again noted. Again noted fibrotic changes lung bases. Persistent streaky bilateral basilar atelectasis or infiltrate.   Electronically Signed   By: Lahoma Crocker M.D.   On: 12/21/2014 19:47   Dg Chest 2 View  12/17/2014   CLINICAL DATA:  Shortness of breath.  Flu-like syndrome 1 week ago.  EXAM: CHEST  2 VIEW  COMPARISON:  10/25/2008.  FINDINGS: Mediastinum hilar structures are normal. Borderline cardiomegaly. Bibasilar pulmonary infiltrates, left side greater than right. These findings are consistent with pneumonia. Questionable nodular density right mid lung. This may be related to pulmonary infiltrates. No pleural effusion or pneumothorax. No acute bony abnormality.  IMPRESSION: 1. Bibasilar pulmonary infiltrates, left side greater than right. These findings are consistent bibasilar pneumonia.  2. Questionable nodular density over the right mid lung. This most likely relates to pulmonary  infiltrates. This can be followed on subsequent chest x-rays to demonstrate clearing.  3.  Borderline cardiomegaly.   Electronically Signed   By: Marcello Moores  Register   On: 12/17/2014 16:05   Dg Shoulder Right  12/23/2014   CLINICAL DATA:  Bilateral shoulder pain and stiffness.  EXAM: RIGHT SHOULDER - 2+ VIEW  COMPARISON:  Two-view chest x-ray 12/21/2014  FINDINGS: There is no evidence of fracture or dislocation. There is no evidence of arthropathy or other focal bone abnormality. Soft tissues are unremarkable.  IMPRESSION: Negative right shoulder radiographs   Electronically Signed   By: San Morelle M.D.   On: 12/23/2014 12:42   Ct Chest Wo Contrast  12/19/2014   CLINICAL DATA:  Persistent fever and chills for past 5 weeks.  EXAM: CT CHEST WITHOUT CONTRAST  TECHNIQUE: Multidetector CT imaging of the chest was performed following the standard protocol without IV contrast material  administration.  COMPARISON:  December 17, 2014 chest radiograph  FINDINGS: There are small bilateral pleural effusions. There is patchy fibrotic change in the periphery of each lung base. There is patchy airspace consolidation in both lower lobes, primarily in the posterior segment on the left and in the superior and posterior segments of the right. There is also patchy airspace consolidation in the inferior aspect of the lateral segment right middle lobe. There is mild upper and lower lobe bronchiectatic change bilaterally, more pronounced in the lower lobes than the upper lobes.  There are multiple small mediastinal lymph nodes. There is a lymph node is present just to the right of the inferior carina measuring 2.0 x 1.2 cm. There is a lymph node to the left of the carina measuring 2.1 x 1.5 cm. Remaining lymph nodes are either subcentimeter or upper normal in size.  There is a small pericardial effusion. There is no thoracic aortic aneurysm. There are scattered foci of coronary artery calcification. Thyroid appears unremarkable.  In the visualized upper abdomen, no focal lesions are identified on this noncontrast enhanced study.  There are no blastic or lytic bone lesions.  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 There are small pleural effusions bilaterally as well as patchy areas of airspace consolidation. There is also a small pericardial effusion. The areas of airspace consolidation most likely represent pneumonia, although alveolar edema could present in this manner. This finding coupled with a pleural effusions raises question of a degree of congestive heart failure, although the pleural effusions may be reactive secondary to bibasilar pneumonia.  There is a degree of bronchiectasis bilaterally.  Mild adenopathy is present of uncertain etiology.   Electronically Signed   By: Lowella Grip III M.D.    On: 12/19/2014 09:42   Ct Angio Chest Pe W/cm &/or Wo Cm  01/02/2015   CLINICAL DATA:  Shortness of breath, chest pain, recently discharge from hospital for same symptoms, leg swelling now taking diuretic  EXAM: CT ANGIOGRAPHY CHEST WITH CONTRAST  TECHNIQUE: Multidetector CT imaging of the chest was performed using the standard protocol during bolus administration of intravenous contrast. Multiplanar CT image reconstructions and MIPs were obtained to evaluate the vascular anatomy.  CONTRAST:  137mL OMNIPAQUE IOHEXOL 350 MG/ML SOLN IV  COMPARISON:  12/19/2014 noncontrast CT chest  FINDINGS: Small pericardial effusion.  Aorta normal caliber without aneurysm or dissection.  Few coronary arterial calcifications noted.  Diffuse wall thickening of the thoracic esophagus.  Probable enlarged subcarinal lymph node 13 mm short  axis image 51.  Minimally enlarged RIGHT paratracheal lymph node 11 mm short axis image 29.  Additional mediastinal nodes are normal to upper normal in size.  Visualized portion of upper abdomen normal.  Pulmonary arterial branches in the lower lobes are suboptimally assessed due to respiratory motion artifacts and extensive infiltrates.  No gross evidence of pulmonary embolism identified.  Small BILATERAL pleural effusions.  Extensive airspace infiltrates in BILATERAL lower lobes and in RIGHT middle lobe.  Upper lobes clear.  Small calcified lymph nodes at LEFT hilum.  No pneumothorax or acute osseous findings.  Review of the MIP images confirms the above findings.  IMPRESSION: No gross evidence of pulmonary embolism as discussed above.  BILATERAL lower lobe and mild RIGHT middle lobe pulmonary infiltrates persist.  Small BILATERAL pleural and small pericardial effusions.  Diffuse thickening of esophageal wall, also present on prior exam, suggesting a diffuse esophagitis not significantly changed.   Electronically Signed   By: Lavonia Dana M.D.   On: 01/02/2015 17:04   Dg Chest Port 1  View  12/25/2014   CLINICAL DATA:  74 year old male with respiratory failure. Recent fever and abnormal chest CT. Initial encounter.  EXAM: PORTABLE CHEST - 1 VIEW  COMPARISON:  12/21/2014 and earlier.  FINDINGS: Portable AP semi upright view at 0608 hours. Progressed and confluent bibasilar opacity since 12/17/2014. Stable cardiac size and mediastinal contours. Stable mild increased interstitial markings elsewhere. No pneumothorax. No overt edema.  IMPRESSION: Progressed confluent bibasilar pulmonary opacity over this series of exams, favor bilateral pneumonia.   Electronically Signed   By: Genevie Ann M.D.   On: 12/25/2014 08:10   Dg Shoulder Left  12/23/2014   CLINICAL DATA:  Bilateral shoulder pain/stiffness x1 week, no known injury  EXAM: LEFT SHOULDER - 2+ VIEW  COMPARISON:  None.  FINDINGS: No fracture or dislocation is seen.  The joint spaces are preserved.  Visualized soft tissues are within normal limits.  Visualized left lung is clear.  IMPRESSION: No acute osseus abnormality is seen.   Electronically Signed   By: Julian Hy M.D.   On: 12/23/2014 12:42   Dg Knee Complete 4 Views Left  12/20/2014   CLINICAL DATA:  Left lateral knee pain.  EXAM: LEFT KNEE - COMPLETE 4+ VIEW  COMPARISON:  None.  FINDINGS: No acute fracture or dislocation. Moderate-severe lateral femorotibial compartment osteoarthritis with severe joint space narrowing and marginal osteophytosis. No significant joint effusion.  IMPRESSION: Severe lateral femorotibial compartment osteoarthritis.   Electronically Signed   By: Kathreen Devoid   On: 12/20/2014 13:02    Microbiology: Recent Results (from the past 240 hour(s))  Blood Culture (routine x 2)     Status: None (Preliminary result)   Collection Time: 01/06/15  6:25 PM  Result Value Ref Range Status   Specimen Description BLOOD LEFT ARM  Final   Special Requests BOTTLES DRAWN AEROBIC AND ANAEROBIC 5CC  Final   Culture   Final           BLOOD CULTURE RECEIVED NO GROWTH TO  DATE CULTURE WILL BE HELD FOR 5 DAYS BEFORE ISSUING A FINAL NEGATIVE REPORT Performed at [facility]    Report Status PENDING  Incomplete  Blood Culture (routine x 2)     Status: None (Preliminary result)   Collection Time: 01/06/15  6:40 PM  Result Value Ref Range Status   Specimen Description BLOOD LEFT HAND  Final   Special Requests BOTTLES DRAWN AEROBIC AND ANAEROBIC 5CC  Final  Culture   Final           BLOOD CULTURE RECEIVED NO GROWTH TO DATE CULTURE WILL BE HELD FOR 5 DAYS BEFORE ISSUING A FINAL NEGATIVE REPORT Performed at [facility]    Report Status PENDING  Incomplete  Respiratory virus antigens panel     Status: None   Collection Time: 01/06/15  6:40 PM  Result Value Ref Range Status   Source - RVPAN NASAL SWAB  Corrected   Respiratory Syncytial Virus A Negative Negative Final   Respiratory Syncytial Virus B Negative Negative Final   Influenza A Negative Negative Final   Influenza B Negative Negative Final   Parainfluenza 1 Negative Negative Final   Parainfluenza 2 Negative Negative Final   Parainfluenza 3 Negative Negative Final   Metapneumovirus Negative Negative Final   Rhinovirus Negative Negative Final   Adenovirus Negative Negative Final    Comment: (NOTE) Performed At: [facility] [address] [place], [place] [id number] Lindon Romp MD [id number]   Urine culture     Status: None   Collection Time: 01/06/15  6:42 PM  Result Value Ref Range Status   Specimen Description URINE, CLEAN CATCH  Final   Special Requests NONE  Final   Colony Count   Final    25,000 COLONIES/ML Performed at [facility]    Culture   Final    Multiple bacterial morphotypes present, none predominant. Suggest appropriate recollection if clinically indicated. Performed at [facility]    Report Status 01/07/2015 FINAL  Final  Culture, expectorated sputum-assessment     Status: None   Collection Time: 01/07/15  9:55 AM  Result  Value Ref Range Status   Specimen Description SPUTUM  Final   Special Requests NONE  Final   Sputum evaluation   Final    MICROSCOPIC FINDINGS SUGGEST THAT THIS SPECIMEN IS NOT REPRESENTATIVE OF LOWER RESPIRATORY SECRETIONS. PLEASE RECOLLECT. Gram Stain Report Called to,Read Back By and Verified With: Vale Haven AT 1336 01/07/15 BY K BARR    Report Status 01/07/2015 FINAL  Final     Labs: Basic Metabolic Panel:  Recent Labs Lab 01/06/15 1609 01/08/15 0521 01/09/15 0530  NA 132* 134* 137  K 4.4 4.3 3.8  CL 102 104 104  CO2 22 20 23   GLUCOSE 137* 120* 126*  BUN 19 28* 31*  CREATININE 1.08 1.09 1.39*  CALCIUM 8.5 8.4 8.3*   Liver Function Tests:  Recent Labs Lab 01/06/15 1609  AST 46*  ALT 37  ALKPHOS 136*  BILITOT 0.7  PROT 6.7  ALBUMIN 2.2*   No results for input(s): LIPASE, AMYLASE in the last 168 hours. No results for input(s): AMMONIA in the last 168 hours. CBC:  Recent Labs Lab 01/06/15 1609 01/09/15 0530  WBC 24.4* 20.8*  NEUTROABS 21.7*  --   HGB 11.9* 11.4*  HCT 37.0* 35.7*  MCV 88.5 89.9  PLT 278 172   Cardiac Enzymes: No results for input(s): CKTOTAL, CKMB, CKMBINDEX, TROPONINI in the last 168 hours. BNP: BNP (last 3 results)  Recent Labs  12/17/14 1934 01/02/15 1241 01/06/15 1758  BNP 38.6 66.6 83.3    ProBNP (last 3 results) No results for input(s): PROBNP in the last 8760 hours.  CBG: No results for input(s): GLUCAP in the last 168 hours.     SignedEulogio Bear  [facility] 01/09/2015, 2:35 PM

## 2015-01-10 LAB — LEGIONELLA PNEUMOPHILA TOTAL AB
Serogroup 1: <1:16 {titer}
Serogroups 2,3,4,5,6,8: <1:16 {titer}

## 2015-01-10 NOTE — Progress Notes (Signed)
Per NCM Tomi Bamberger,  Home Health arranged with [facility]. Jonnie Finner RN CCM Case Mgmt

## 2015-01-12 DIAGNOSIS — S37019A Minor contusion of unspecified kidney, initial encounter: Secondary | ICD-10-CM

## 2015-01-12 HISTORY — DX: Minor contusion of unspecified kidney, initial encounter: S37.019A

## 2015-01-13 LAB — CULTURE, BLOOD (ROUTINE X 2)
Culture: NO GROWTH
Culture: NO GROWTH

## 2015-01-28 ENCOUNTER — Encounter: Payer: Self-pay | Admitting: Internal Medicine

## 2015-01-28 ENCOUNTER — Ambulatory Visit (INDEPENDENT_AMBULATORY_CARE_PROVIDER_SITE_OTHER)
Admission: RE | Admit: 2015-01-28 | Discharge: 2015-01-28 | Disposition: A | Discharge status: Home / Self Care | Payer: Medicare Other | Source: Ambulatory Visit | Attending: Internal Medicine | Admitting: Internal Medicine

## 2015-01-28 ENCOUNTER — Ambulatory Visit (INDEPENDENT_AMBULATORY_CARE_PROVIDER_SITE_OTHER): Payer: Medicare Other | Admitting: Internal Medicine

## 2015-01-28 VITALS — BP 136/70 | HR 111 | Ht 70.0 in | Wt 216.0 lb

## 2015-01-28 DIAGNOSIS — M3500 Sicca syndrome, unspecified: Secondary | ICD-10-CM

## 2015-01-28 DIAGNOSIS — J189 Pneumonia, unspecified organism: Secondary | ICD-10-CM

## 2015-01-28 DIAGNOSIS — J9621 Acute and chronic respiratory failure with hypoxia: Secondary | ICD-10-CM | POA: Insufficient documentation

## 2015-01-28 DIAGNOSIS — R600 Localized edema: Secondary | ICD-10-CM | POA: Insufficient documentation

## 2015-01-28 DIAGNOSIS — R5381 Other malaise: Secondary | ICD-10-CM | POA: Diagnosis not present

## 2015-01-28 HISTORY — DX: Acute and chronic respiratory failure with hypoxia: J96.21

## 2015-01-28 HISTORY — DX: Sjogren syndrome, unspecified: M35.00

## 2015-01-28 MED ORDER — FUROSEMIDE 40 MG PO TABS: 40.0000 mg | ORAL_TABLET | Freq: Every day | ORAL | Status: DC

## 2015-01-28 MED ORDER — POTASSIUM CHLORIDE CRYS ER 20 MEQ PO TBCR: 20.0000 meq | EXTENDED_RELEASE_TABLET | Freq: Every day | ORAL | Status: DC

## 2015-01-28 NOTE — Patient Instructions (Addendum)
ICD-9-CM ICD-10-CM   1. Acute on chronic respiratory failure with hypoxia 518.84 J96.21    799.02    2. HCAP (healthcare-associated pneumonia) 87 J18.9 DG Chest 2 View  3. Physical deconditioning 799.3 R53.81   4. 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 Pedal edema 782.3 R60.0     Continue oxygen therapy as before Continue prednisone 5 mg dail  continue home physical therapyy  Start 40 mg daily Lasix in the morni Start  potassium chloride 20 mEq daily in the morning  Do Doppler ultrasound lower extremities for edema and rule out DVT  make sure you keep her follow-up with D Gavin Pound of rheumatology  Follow-up - CBC and chemistries in one week - 1 week with nurse practitioner Patricia Nettle

## 2015-01-28 NOTE — Progress Notes (Signed)
Subjective:    Patient ID: Travis [name], male    DOB: 1941/06/20, 74 y.o.   MRN: [id number]  HPI   OV 01/28/2015  Chief Complaint  Patient presents with  . Follow-up    Pt here for HFU for HCAP. Pt c/o weakness, BIL LE edema, SOB, prod cough with white and yellow mucus. Pt denies CP/tightness.     74 year old African-American male accompanied by his wife. History is obtained from him, his wife and review of the old records especially pulmonary note,  admission history and discharge in April 2016.  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 After that he took unwell and was admitted between 12/17/2014 through 12/26/2014 with a diagnosis of community-acquired pneumonia [CT scan of the chest 12/19/2014 shows bilateral bibasal infiltrates suggestive of pulmonary fibrosis v basal consolidation. This scan was personally reviewed by me and I do not think this fits a UIP pattern although it is reported as such]. Clinically was treated as community by pneumonia but because of the CT scan findings of ILD he had autoimmune blood lab workup on 12/21/2014 that was essentially negative especially for vasculitis but was positive for SSA, trace positive for rheumatoid factor. At this point a conversation was held with Dr. Gavin Pound of rheumatology and a presumptive diagnosis of Sjogren syndrome was made and patient was started on 20 mg daily of prednisone and discharged.  He did have a follow-up CT scan of the chest 01/02/2015 that I personally visualized the image again and this one shows some amount of bilateral bibasal pleural effusions at a small but more consolidative changes suggestive of pneumonia. He was admitted 01/06/2015 through 01/09/2015 with a diagnosis of hospital-acquired pneumonia and treated as such. Of note present  complaint at that time was edema but at discharge he did not seem to be on Lasix.  He now presents for office  follow-up 01/28/2015. He has a rheumatology appointment Dr. Trudie Reed in 02/05/2015. He tells me that home physical therapy is working with him but he is physically extremely deconditioned and rates it as severe. He is unable to stand up easily. My nurse says it was a real difficulty to have him stand from his wheelchair to the weighing scale. At the same time he denies any proximal muscle weakness although he admits that he has difficulty standing up. He is short of breath. In addition his worsening pedal edema. He is not on Lasix.  Of note, early April 2016 duplex lower expect ultrasound ruled out DVT - reviewe this result    has a past medical history of Medical history non-contributory; Arthritis; Pneumonia; and GERD (gastroesophageal reflux disease).   reports that he has never smoked. He has never used smokeless tobacco.  Past Surgical History  Procedure Laterality Date  . Hemorroidectomy  1999  . Surgical procedure to remove a mole as a child Right Eye area    At around 65 years old    No Known Allergies  There is no immunization history for the selected administration types on file for this patient.  Family History  Problem Relation Age of Onset  . Hypertension Mother      Current outpatient prescriptions:  .  cyanocobalamin 500 MCG tablet, Take 1 tablet (500 mcg total) by mouth daily., Disp: 30 tablet, Rfl: 0 .  feeding supplement, ENSURE ENLIVE, (ENSURE ENLIVE) LIQD, Take 237 mLs by mouth 2 (two) times daily between meals., Disp: 237  mL, Rfl: 12 .  ferrous sulfate 325 (65 FE) MG tablet, Take 1 tablet (325 mg total) by mouth 2 (two) times daily with a meal., Disp: 60 tablet, Rfl: 0 .  loratadine (CLARITIN) 10 MG tablet, Take 10 mg by mouth daily., Disp: , Rfl:  .  nystatin (MYCOSTATIN) 100000 UNIT/ML suspension, Take 5 mLs (500,000 Units total) by mouth 4 (four) times daily. For 7 days, Disp: 60 mL, Rfl: 0 .  predniSONE (DELTASONE) 10 MG tablet, Take 1 tablet (10 mg total) by  mouth daily with breakfast. (Patient taking differently: Take 5 mg by mouth daily with breakfast. ), Disp: 30 tablet, Rfl: 0 .  traMADol-acetaminophen (ULTRACET) 37.5-325 MG per tablet, Take 1-2 tablets by mouth every 4 (four) hours as needed for severe pain., Disp: 30 tablet, Rfl: 0     Review of Systems  Constitutional: Positive for fatigue. Negative for fever and unexpected weight change.  HENT: Negative for congestion, dental problem, ear pain, nosebleeds, postnasal drip, rhinorrhea, sinus pressure, sneezing, sore throat and trouble swallowing.   Eyes: Negative for redness and itching.  Respiratory: Positive for cough and shortness of breath. Negative for chest tightness and wheezing.   Cardiovascular: Positive for leg swelling. Negative for palpitations.  Gastrointestinal: Negative for nausea and vomiting.  Genitourinary: Negative for dysuria.  Musculoskeletal: Negative for joint swelling.  Skin: Negative for rash.  Neurological: Negative for headaches.  Hematological: Does not bruise/bleed easily.  Psychiatric/Behavioral: Negative for dysphoric mood. The patient is not nervous/anxious.        Objective:   Physical Exam  Constitutional: He is oriented to person, place, and time. He appears well-developed and well-nourished. No distress.  Body mass index is 30.99 kg/(m^2).   HENT:  Head: Normocephalic and atraumatic.  Right Ear: External ear normal.  Left Ear: External ear normal.  Mouth/Throat: Oropharynx is clear and moist. No oropharyngeal exudate.  Oxygen on nasal cannula  Eyes: Conjunctivae and EOM are normal. Pupils are equal, round, and reactive to light. Right eye exhibits no discharge. Left eye exhibits no discharge. No scleral icterus.  Neck: Normal range of motion. Neck supple. No JVD present. No tracheal deviation present. No thyromegaly present.  Cardiovascular: Normal rate, regular rhythm and intact distal pulses.  Exam reveals no gallop and no friction rub.   No  murmur heard. Pulmonary/Chest: Effort normal. No respiratory distress. He has no wheezes. He has rales. He exhibits no tenderness.  Bilateral bibasal lower lobe crackles bottom one fourth  Abdominal: Soft. Bowel sounds are normal. He exhibits no distension and no mass. There is no tenderness. There is no rebound and no guarding.  Musculoskeletal: Normal range of motion. He exhibits edema. He exhibits no tenderness.  Very deconditioned looking male sitting in a wheelchair 3+ pitting pedal edema  Lymphadenopathy:    He has no cervical adenopathy.  Neurological: He is alert and oriented to person, place, and time. He has normal reflexes. No cranial nerve deficit. Coordination normal.  Skin: Skin is warm and dry. No rash noted. He is not diaphoretic. No erythema. No pallor.  Psychiatric:  Flat affect  Nursing note and vitals reviewed.   Filed Vitals:   01/28/15 1507  BP: 136/70  Pulse: 111  Height: 5\' 10"  (1.778 m)  Weight: 216 lb (97.977 kg)  SpO2: 96%          Assessment & Plan:     ICD-9-CM ICD-10-CM   1. Acute on chronic respiratory failure with hypoxia 518.84 J96.21    799.02  2. HCAP (healthcare-associated pneumonia) 31 J18.9 DG Chest 2 View  3. Physical deconditioning 799.3 R53.81   4. 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 Pedal edema 782.3 R60.0      #Deconditioning: He has rapidly deconditioned with the admissions. Unchanged since discharge  #Edema: Unchanged since discharge.  I am concerned abotu his edema - decompensatd CHF diastolic  or DVt/PE either of whch can explain hypoxemia. Will rule out DVt. Will also diurese  # Pulmonar iinfiltrates - Acute on chronic resp failure in setting of Sjogren antibodies: Ucnhanged / Some better since discharge - not sure how much true ILD he has. With time need to repeat HRCT and then reassess. Till then continue prednisone and keep up appt with Dr Trudie Reed  PLAN Continue oxygen therapy as before Continue prednisone 5 mg dail    continue home physical therapyy  Start 40 mg daily Lasix in the morni Start  potassium chloride 20 mEq daily in the morning  Do Doppler ultrasound lower extremities for edema and rule out DVT  make sure you keep her follow-up with D Gavin Pound of rheumatology  Follow-up - CBC and chemistries in one week - 1 week with nurse practitioner Patricia Nettle          Dr. Brand Males, M.D., F.C.C.P Pulmonary and Critical Care Medicine Staff Physician [facility] Pager: [phone], If no answer or between  15:00h - 7:00h: call [phone]  01/28/2015 3:54 PM

## 2015-01-29 ENCOUNTER — Encounter (HOSPITAL_COMMUNITY): Payer: Self-pay | Admitting: Emergency Medicine

## 2015-01-29 ENCOUNTER — Ambulatory Visit (HOSPITAL_BASED_OUTPATIENT_CLINIC_OR_DEPARTMENT_OTHER): Payer: Medicare Other

## 2015-01-29 ENCOUNTER — Emergency Department (HOSPITAL_COMMUNITY): Payer: Medicare Other

## 2015-01-29 ENCOUNTER — Other Ambulatory Visit: Payer: Self-pay

## 2015-01-29 ENCOUNTER — Inpatient Hospital Stay (HOSPITAL_COMMUNITY)
Admission: EM | Admit: 2015-01-29 | Discharge: 2015-02-19 | DRG: 252 | Disposition: A | Discharge status: Home Health | Payer: Medicare Other | Attending: Internal Medicine | Admitting: Internal Medicine

## 2015-01-29 ENCOUNTER — Telehealth: Payer: Self-pay | Admitting: Internal Medicine

## 2015-01-29 ENCOUNTER — Other Ambulatory Visit (HOSPITAL_COMMUNITY): Payer: Self-pay

## 2015-01-29 DIAGNOSIS — R109 Unspecified abdominal pain: Secondary | ICD-10-CM

## 2015-01-29 DIAGNOSIS — N189 Chronic kidney disease, unspecified: Secondary | ICD-10-CM

## 2015-01-29 DIAGNOSIS — K219 Gastro-esophageal reflux disease without esophagitis: Secondary | ICD-10-CM | POA: Diagnosis present

## 2015-01-29 DIAGNOSIS — R609 Edema, unspecified: Secondary | ICD-10-CM | POA: Diagnosis present

## 2015-01-29 DIAGNOSIS — T380X5A Adverse effect of glucocorticoids and synthetic analogues, initial encounter: Secondary | ICD-10-CM

## 2015-01-29 DIAGNOSIS — D696 Thrombocytopenia, unspecified: Secondary | ICD-10-CM | POA: Diagnosis not present

## 2015-01-29 DIAGNOSIS — Z452 Encounter for adjustment and management of vascular access device: Secondary | ICD-10-CM

## 2015-01-29 DIAGNOSIS — Z515 Encounter for palliative care: Secondary | ICD-10-CM | POA: Insufficient documentation

## 2015-01-29 DIAGNOSIS — I82441 Acute embolism and thrombosis of right tibial vein: Secondary | ICD-10-CM

## 2015-01-29 DIAGNOSIS — R600 Localized edema: Secondary | ICD-10-CM | POA: Diagnosis not present

## 2015-01-29 DIAGNOSIS — Z419 Encounter for procedure for purposes other than remedying health state, unspecified: Secondary | ICD-10-CM

## 2015-01-29 DIAGNOSIS — D62 Acute posthemorrhagic anemia: Secondary | ICD-10-CM | POA: Diagnosis not present

## 2015-01-29 DIAGNOSIS — D594 Other nonautoimmune hemolytic anemias: Secondary | ICD-10-CM | POA: Diagnosis not present

## 2015-01-29 DIAGNOSIS — IMO0001 Reserved for inherently not codable concepts without codable children: Secondary | ICD-10-CM | POA: Diagnosis present

## 2015-01-29 DIAGNOSIS — Z992 Dependence on renal dialysis: Secondary | ICD-10-CM

## 2015-01-29 DIAGNOSIS — Z79899 Other long term (current) drug therapy: Secondary | ICD-10-CM | POA: Diagnosis not present

## 2015-01-29 DIAGNOSIS — N99821 Postprocedural hemorrhage and hematoma of a genitourinary system organ or structure following other procedure: Secondary | ICD-10-CM | POA: Diagnosis not present

## 2015-01-29 DIAGNOSIS — Y92239 Unspecified place in hospital as the place of occurrence of the external cause: Secondary | ICD-10-CM | POA: Diagnosis not present

## 2015-01-29 DIAGNOSIS — Y848 Other medical procedures as the cause of abnormal reaction of the patient, or of later complication, without mention of misadventure at the time of the procedure: Secondary | ICD-10-CM | POA: Diagnosis not present

## 2015-01-29 DIAGNOSIS — I82409 Acute embolism and thrombosis of unspecified deep veins of unspecified lower extremity: Secondary | ICD-10-CM | POA: Diagnosis present

## 2015-01-29 DIAGNOSIS — J8410 Pulmonary fibrosis, unspecified: Secondary | ICD-10-CM | POA: Diagnosis present

## 2015-01-29 DIAGNOSIS — E538 Deficiency of other specified B group vitamins: Secondary | ICD-10-CM | POA: Diagnosis present

## 2015-01-29 DIAGNOSIS — N185 Chronic kidney disease, stage 5: Secondary | ICD-10-CM | POA: Diagnosis not present

## 2015-01-29 DIAGNOSIS — N186 End stage renal disease: Secondary | ICD-10-CM | POA: Diagnosis not present

## 2015-01-29 DIAGNOSIS — T148XXA Other injury of unspecified body region, initial encounter: Secondary | ICD-10-CM

## 2015-01-29 DIAGNOSIS — M311 Thrombotic microangiopathy, unspecified: Secondary | ICD-10-CM | POA: Insufficient documentation

## 2015-01-29 DIAGNOSIS — R5381 Other malaise: Secondary | ICD-10-CM | POA: Diagnosis present

## 2015-01-29 DIAGNOSIS — I82401 Acute embolism and thrombosis of unspecified deep veins of right lower extremity: Secondary | ICD-10-CM | POA: Diagnosis not present

## 2015-01-29 DIAGNOSIS — N059 Unspecified nephritic syndrome with unspecified morphologic changes: Secondary | ICD-10-CM

## 2015-01-29 DIAGNOSIS — R030 Elevated blood-pressure reading, without diagnosis of hypertension: Secondary | ICD-10-CM | POA: Diagnosis not present

## 2015-01-29 DIAGNOSIS — S37012S Minor contusion of left kidney, sequela: Secondary | ICD-10-CM | POA: Diagnosis not present

## 2015-01-29 DIAGNOSIS — I120 Hypertensive chronic kidney disease with stage 5 chronic kidney disease or end stage renal disease: Secondary | ICD-10-CM

## 2015-01-29 DIAGNOSIS — Z9889 Other specified postprocedural states: Secondary | ICD-10-CM

## 2015-01-29 DIAGNOSIS — J9621 Acute and chronic respiratory failure with hypoxia: Secondary | ICD-10-CM | POA: Diagnosis not present

## 2015-01-29 DIAGNOSIS — M150 Primary generalized (osteo)arthritis: Secondary | ICD-10-CM | POA: Insufficient documentation

## 2015-01-29 DIAGNOSIS — I2699 Other pulmonary embolism without acute cor pulmonale: Secondary | ICD-10-CM | POA: Diagnosis not present

## 2015-01-29 DIAGNOSIS — N179 Acute kidney failure, unspecified: Secondary | ICD-10-CM | POA: Diagnosis present

## 2015-01-29 DIAGNOSIS — I82491 Acute embolism and thrombosis of other specified deep vein of right lower extremity: Secondary | ICD-10-CM | POA: Diagnosis present

## 2015-01-29 DIAGNOSIS — M3500 Sicca syndrome, unspecified: Secondary | ICD-10-CM | POA: Diagnosis present

## 2015-01-29 DIAGNOSIS — R0600 Dyspnea, unspecified: Secondary | ICD-10-CM | POA: Diagnosis not present

## 2015-01-29 DIAGNOSIS — Z7901 Long term (current) use of anticoagulants: Secondary | ICD-10-CM | POA: Diagnosis not present

## 2015-01-29 DIAGNOSIS — Z7952 Long term (current) use of systemic steroids: Secondary | ICD-10-CM | POA: Diagnosis not present

## 2015-01-29 DIAGNOSIS — I82402 Acute embolism and thrombosis of unspecified deep veins of left lower extremity: Secondary | ICD-10-CM | POA: Diagnosis not present

## 2015-01-29 DIAGNOSIS — R739 Hyperglycemia, unspecified: Secondary | ICD-10-CM | POA: Diagnosis present

## 2015-01-29 HISTORY — DX: Pulmonary fibrosis, unspecified: J84.10

## 2015-01-29 LAB — URINALYSIS, ROUTINE W REFLEX MICROSCOPIC
BILIRUBIN URINE: NEGATIVE
Glucose, UA: NEGATIVE mg/dL
KETONES UR: NEGATIVE mg/dL
LEUKOCYTES UA: NEGATIVE
Nitrite: NEGATIVE
PH: 5 (ref 5.0–8.0)
Protein, ur: 100 mg/dL — AB
Specific Gravity, Urine: 1.013 (ref 1.005–1.030)
Urobilinogen, UA: 0.2 mg/dL (ref 0.0–1.0)

## 2015-01-29 LAB — CBC WITH DIFFERENTIAL/PLATELET
BASOS ABS: 0 10*3/uL (ref 0.0–0.1)
BASOS PCT: 0 % (ref 0–1)
EOS ABS: 0.2 10*3/uL (ref 0.0–0.7)
Eosinophils Relative: 1 % (ref 0–5)
HEMATOCRIT: 29.7 % — AB (ref 39.0–52.0)
Hemoglobin: 9.4 g/dL — ABNORMAL LOW (ref 13.0–17.0)
LYMPHS ABS: 1.4 10*3/uL (ref 0.7–4.0)
LYMPHS PCT: 9 % — AB (ref 12–46)
MCH: 30.4 pg (ref 26.0–34.0)
MCHC: 31.6 g/dL (ref 30.0–36.0)
MCV: 96.1 fL (ref 78.0–100.0)
Monocytes Absolute: 1.2 10*3/uL — ABNORMAL HIGH (ref 0.1–1.0)
Monocytes Relative: 8 % (ref 3–12)
NEUTROS ABS: 12.8 10*3/uL — AB (ref 1.7–7.7)
Neutrophils Relative %: 82 % — ABNORMAL HIGH (ref 43–77)
Platelets: 159 10*3/uL (ref 150–400)
RBC: 3.09 MIL/uL — ABNORMAL LOW (ref 4.22–5.81)
RDW: 22.5 % — AB (ref 11.5–15.5)
WBC: 15.6 10*3/uL — ABNORMAL HIGH (ref 4.0–10.5)

## 2015-01-29 LAB — BASIC METABOLIC PANEL
ANION GAP: 10 (ref 5–15)
BUN: 33 mg/dL — ABNORMAL HIGH (ref 6–20)
CHLORIDE: 102 mmol/L (ref 101–111)
CO2: 24 mmol/L (ref 22–32)
CREATININE: 2.94 mg/dL — AB (ref 0.61–1.24)
Calcium: 8.5 mg/dL — ABNORMAL LOW (ref 8.9–10.3)
GFR calc Af Amer: 23 mL/min — ABNORMAL LOW (ref >60)
GFR calc non Af Amer: 20 mL/min — ABNORMAL LOW (ref >60)
Glucose, Bld: 117 mg/dL — ABNORMAL HIGH (ref 65–99)
Potassium: 4.3 mmol/L (ref 3.5–5.1)
SODIUM: 136 mmol/L (ref 135–145)

## 2015-01-29 LAB — PROTIME-INR
INR: 1.19 (ref 0.00–1.49)
Prothrombin Time: 15.3 seconds — ABNORMAL HIGH (ref 11.6–15.2)

## 2015-01-29 LAB — URINE MICROSCOPIC-ADD ON

## 2015-01-29 LAB — I-STAT TROPONIN, ED: Troponin i, poc: 0.03 ng/mL (ref 0.00–0.08)

## 2015-01-29 LAB — GLUCOSE, CAPILLARY: Glucose-Capillary: 86 mg/dL (ref 65–99)

## 2015-01-29 LAB — CREATININE, URINE, RANDOM: Creatinine, Urine: 101.29 mg/dL

## 2015-01-29 LAB — SODIUM, URINE, RANDOM: SODIUM UR: 47 mmol/L

## 2015-01-29 MED ORDER — PREDNISONE 5 MG PO TABS
5.0000 mg | ORAL_TABLET | Freq: Every day | ORAL | Status: DC
  Administered 2015-01-30 – 2015-02-01 (×3): 5 mg via ORAL
  Filled 2015-01-29 (×4): qty 1

## 2015-01-29 MED ORDER — FERROUS SULFATE 325 (65 FE) MG PO TABS
325.0000 mg | ORAL_TABLET | Freq: Two times a day (BID) | ORAL | Status: DC
  Administered 2015-01-30 – 2015-02-16 (×35): 325 mg via ORAL
  Filled 2015-01-29 (×39): qty 1

## 2015-01-29 MED ORDER — INSULIN ASPART 100 UNIT/ML ~~LOC~~ SOLN
0.0000 [IU] | Freq: Three times a day (TID) | SUBCUTANEOUS | Status: DC
  Administered 2015-01-30: 1 [IU] via SUBCUTANEOUS
  Administered 2015-02-02 (×2): 2 [IU] via SUBCUTANEOUS
  Administered 2015-02-03 (×2): 1 [IU] via SUBCUTANEOUS
  Administered 2015-02-04 (×2): 2 [IU] via SUBCUTANEOUS
  Administered 2015-02-07 – 2015-02-11 (×4): 1 [IU] via SUBCUTANEOUS

## 2015-01-29 MED ORDER — SODIUM CHLORIDE 0.9 % IJ SOLN
3.0000 mL | Freq: Two times a day (BID) | INTRAMUSCULAR | Status: DC
Start: 2015-01-29 — End: 2015-02-19
  Administered 2015-01-31 – 2015-02-09 (×14): 3 mL via INTRAVENOUS
  Administered 2015-02-09: 10 mL via INTRAVENOUS
  Administered 2015-02-10 – 2015-02-18 (×13): 3 mL via INTRAVENOUS

## 2015-01-29 MED ORDER — ALUM & MAG HYDROXIDE-SIMETH 200-200-20 MG/5ML PO SUSP: 30.0000 mL | Freq: Four times a day (QID) | ORAL | Status: DC | PRN

## 2015-01-29 MED ORDER — CYANOCOBALAMIN 500 MCG PO TABS
500.0000 ug | ORAL_TABLET | Freq: Every day | ORAL | Status: DC
  Administered 2015-01-29 – 2015-02-19 (×20): 500 ug via ORAL
  Filled 2015-01-29 (×23): qty 1

## 2015-01-29 MED ORDER — ACETAMINOPHEN 325 MG PO TABS
650.0000 mg | ORAL_TABLET | Freq: Four times a day (QID) | ORAL | Status: DC | PRN
  Administered 2015-02-06 – 2015-02-09 (×3): 650 mg via ORAL
  Filled 2015-01-29 (×2): qty 2

## 2015-01-29 MED ORDER — HEPARIN (PORCINE) IN NACL 100-0.45 UNIT/ML-% IJ SOLN
1550.0000 [IU]/h | INTRAMUSCULAR | Status: DC
  Administered 2015-01-29: 1550 [IU]/h via INTRAVENOUS
  Filled 2015-01-29 (×2): qty 250

## 2015-01-29 MED ORDER — TECHNETIUM TO 99M ALBUMIN AGGREGATED
6.0000 | Freq: Once | INTRAVENOUS | Status: AC | PRN
  Administered 2015-01-29: 6 via INTRAVENOUS

## 2015-01-29 MED ORDER — TECHNETIUM TC 99M DIETHYLENETRIAME-PENTAACETIC ACID: 40.0000 | Freq: Once | INTRAVENOUS | Status: DC | PRN

## 2015-01-29 MED ORDER — HYDRALAZINE HCL 20 MG/ML IJ SOLN: 10.0000 mg | Freq: Four times a day (QID) | INTRAMUSCULAR | Status: DC | PRN

## 2015-01-29 MED ORDER — PANTOPRAZOLE SODIUM 40 MG PO TBEC
40.0000 mg | DELAYED_RELEASE_TABLET | Freq: Every day | ORAL | Status: DC
  Administered 2015-01-30 – 2015-02-19 (×19): 40 mg via ORAL
  Filled 2015-01-29 (×16): qty 1

## 2015-01-29 MED ORDER — ACETAMINOPHEN 650 MG RE SUPP: 650.0000 mg | Freq: Four times a day (QID) | RECTAL | Status: DC | PRN

## 2015-01-29 MED ORDER — SODIUM CHLORIDE 0.9 % IV SOLN
INTRAVENOUS | Status: DC
Start: 2015-01-29 — End: 2015-02-19
  Administered 2015-01-29: 50 mL via INTRAVENOUS
  Administered 2015-01-31 – 2015-02-01 (×2): via INTRAVENOUS

## 2015-01-29 MED ORDER — HEPARIN BOLUS VIA INFUSION
5000.0000 [IU] | Freq: Once | INTRAVENOUS | Status: AC
  Administered 2015-01-29: 5000 [IU] via INTRAVENOUS
  Filled 2015-01-29: qty 5000

## 2015-01-29 NOTE — ED Provider Notes (Signed)
Discussed care with the hospitalist who will admit the patient, heparin ordered.  Noemi Chapel, MD 01/29/15 [phone]

## 2015-01-29 NOTE — H&P (Signed)
[facility] History and Physical  Travis [name] [id number] DOB: 01-17-1941 DOA: 01/29/2015  Referring physician: Dr. Chase Caller, Pulmonary PCP: [name], MD [facility] Specialists: Pulmonary, Rheumatology  Chief Complaint: Bilateral lower extremity edema and weakness  HPI: Travis [name] is a 74 y.o. gentleman with PMHx of arthritis and acid reflux until last month when he was hospitalized for CAP (4/5-4/14, completed 7 days of IV Rocephin and azithromycin) with repeat admission for HCAP (4/25-4/28, treated with IV vancomycin and cefepime at that time, was not discharged on oral antibiotics).  Chest CT findings were suggestive of pleural effusions and pulmonary fibrosis, which led to a rheumatological work-up.  The patient was found to be ANA neg, but SSA and SSB pos, Rf pos.  He was discharged on prednisone and has rheumatology follow-up next week.  Since his last discharge, he has had persistent weakness, debility, and he has developed significant lower extremity edema.  He complains of having "no energy".  He explicitly denies chest pain, SOB, or light-headedness.  He has not had syncope.  Home O2 was ordered by his PCP last week; he reports "I was told that I qualified for it".  He has been wearing 2L [place], continuous.  He saw Dr. Chase Caller today, who ordered lower extremity dopplers for his leg edema.  Imaging positive for acute DVT to right peroneal, mid and distal posterior tibial veins.  The patient was referred to the ED for initiation of heparin drip and admission.  ED evaluation also concerning for AKI.  Patient presents with elevated BUN and creatinine, new since last discharge.  He was just given a prescription for lasix for his lower extremity swelling, but he has not taken any doses at home.  No lower urinary tract symptoms.  Of note, V/Q scan in ED is intermediate probability.  Renal function prohibits CTA chest at this time.  No evidence of CHF on echo  in April.  Review of Systems: The patient has lost 5-6 pounds in the last 20 days.  Denies thrush, mouth pain, or dysphagia at this time.  Otherwise, 12 systems reviewed and negative except as stated in HPI.  Past Medical History  Diagnosis Date  . Arthritis   . Pneumonia, recurrent, CAP/HCAP, treated as outline above April 2016  . GERD (gastroesophageal reflux disease) Not requiring daily medication at this time.  . Sjogren's disease 01/28/2015  . Pulmonary fibrosis    Past Surgical History  Procedure Laterality Date  . Hemorroidectomy  1999  . Surgical procedure to remove a mole as a child Right Eye area    At around 55 years old   Social History:  History   Social History Narrative  Married with two biological children.  Has a niece who is like a daughter to him.  [place] Veteran.  Former Corporate treasurer.  Gets some treatment at the [place] in [place].  No tobacco, EtOH, or illicit drug use.  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  No Known Allergies  Family History  Problem Relation Age of Onset  . Hypertension Mother    Explicitly denies history of cancer.  Prior to Admission medications   Medication Sig Start Date End Date Taking? Authorizing Provider  cyanocobalamin 500 MCG tablet Take 1 tablet (500 mcg total) by mouth daily. 12/26/14  Yes Donne Hazel, MD  feeding supplement, ENSURE ENLIVE, (ENSURE ENLIVE) LIQD Take 237 mLs by mouth 2 (two) times daily between meals. 01/09/15  Yes Janett Billow  U Vann, DO  ferrous sulfate 325 (65 FE) MG tablet Take 1 tablet (325 mg total) by mouth 2 (two) times daily with a meal. 12/26/14  Yes Donne Hazel, MD  furosemide (LASIX) 40 MG tablet Take 1 tablet (40 mg total) by mouth daily. 01/28/15  Yes Brand Males, MD  loratadine (CLARITIN) 10 MG tablet Take 10 mg by mouth daily.   Yes Historical Provider, MD  nystatin (MYCOSTATIN) 100000 UNIT/ML suspension Take 5 mLs (500,000 Units total) by mouth 4  (four) times daily. For 7 days 01/09/15  Yes Geradine Girt, DO  potassium chloride SA (KLOR-CON M20) 20 MEQ tablet Take 1 tablet (20 mEq total) by mouth daily. 01/28/15  Yes Brand Males, MD  predniSONE (DELTASONE) 10 MG tablet Take 1 tablet (10 mg total) by mouth daily with breakfast. Patient taking differently: Take 5 mg by mouth daily with breakfast.  01/09/15  Yes Geradine Girt, DO  traMADol-acetaminophen (ULTRACET) 37.5-325 MG per tablet Take 1-2 tablets by mouth every 4 (four) hours as needed for severe pain. 12/26/14  Yes Donne Hazel, MD   Physical Exam: Filed Vitals:   01/29/15 1600 01/29/15 1804 01/29/15 1830 01/29/15 1900  BP: 147/80 160/75 157/76 158/80  Pulse: 98 102 101 107  Temp:  98 F (36.7 C)    TempSrc:  Oral    Resp: 23 21 31 17   Height:  5\' 10"  (1.778 m)    Weight:  97.977 kg (216 lb)    SpO2: 100% 98% 100% 100%    General:  Awake and alert.  Oriented to person, place, time and situation.  NAD. Eyes: PERRL bilaterally, conjunctiva are pink.  EOMI. ENT: Moist mucous membranes.  No nasal drainage.  No oral lesions noted. Neck: Supple.  No cervical LAD. Cardiovascular: NR/RR.  2+ pitting edema in bilateral lower extremities. Respiratory: Good air movement bilaterally, no significant wheeze or ronchi. Abdomen: Soft/NT/ND.  Bowel sounds are present.  No guarding. Skin: Warm and dry. Musculoskeletal: No reproducible chest wall tenderness.  Moves all four extremities spontaneously.  + Holman sign on the right.  No palpable cords. Psychiatric: Normal affect. Neurologic: No focal deficits.  Labs on Admission:  Basic Metabolic Panel:  Recent Labs Lab 01/29/15 1438  NA 136  K 4.3  CL 102  CO2 24  GLUCOSE 117*  BUN 33*  CREATININE 2.94*  CALCIUM 8.5*   CBC:  Recent Labs Lab 01/29/15 1438  WBC 15.6*  NEUTROABS 12.8*  HGB 9.4*  HCT 29.7*  MCV 96.1  PLT 159   BNP (last 3 results)  Recent Labs  12/17/14 1934 01/02/15 1241 01/06/15 1758  BNP  38.6 66.6 83.3   Radiological Exams on Admission: Dg Chest 2 View  01/29/2015   CLINICAL DATA:  Pneumonia, shortness of breath.  EXAM: CHEST  2 VIEW  COMPARISON:  Jan 28, 2015.  FINDINGS: Stable cardiomediastinal silhouette. No pneumothorax is noted. Stable bibasilar opacities are noted consistent with pneumonia with probable minimal associated pleural effusions. Bony thorax appears intact.  IMPRESSION: Stable bibasilar opacities are noted most consistent with pneumonia. Continued radiographic follow-up is recommended to ensure resolution.   Electronically Signed   By: Marijo Conception, M.D.   On: 01/29/2015 17:51   Dg Chest 2 View  01/28/2015   CLINICAL DATA:  Pneumonia, followup  EXAM: CHEST  2 VIEW  COMPARISON:  Chest x-ray of 01/06/2015  FINDINGS: There has been worsening of parenchymal opacities in both lower lobes consistent with worsening of bilateral  lower lobe pneumonia. A small right pleural effusion cannot be excluded. Mediastinal and hilar contours are unchanged. The heart is mildly enlarged and stable. No acute bony abnormality is seen.  IMPRESSION: Worsening of lower lobe opacities consistent with pneumonia.   Electronically Signed   By: Ivar Drape M.D.   On: 01/28/2015 14:47   Nm Pulmonary Perf And Vent  01/29/2015   CLINICAL DATA:  Shortness of breath for 2 weeks. Known deep venous thrombosis .  EXAM: NUCLEAR MEDICINE VENTILATION - PERFUSION LUNG SCAN  TECHNIQUE: Ventilation images were obtained in multiple projections using inhaled aerosol Tc-22m DTPA. Perfusion images were obtained in multiple projections after intravenous injection of Tc-58m MAA.  RADIOPHARMACEUTICALS:  40.0 Technetium-31m DTPA aerosol inhalation and 6.0 Technetium-39m MAA IV  COMPARISON:  Chest radiograph same date, which demonstrate left greater than right bibasilar airspace disease.  FINDINGS: Ventilation: Vague decreased ventilation involving the left greater than right lung bases, corresponding to airspace disease  on chest radiograph.  Perfusion: Vague decreased perfusion within the same areas of the lung bases, greater on the left.  IMPRESSION: Triple matched defects within the lung bases, greater on the left than right. Intermediate probability for pulmonary embolism.   Electronically Signed   By: Abigail Miyamoto M.D.   On: 01/29/2015 17:37    EKG: Independently reviewed. Sinus tachycardia.  No acute ST segment changes.  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 Acute DVT with possible DVT (intermediate V/Q scan) --Pharmacy to manage IV heparin infusion --Hypercoaguable panel has been ordered --TED hose for lower extremity edema --Long term anticoagulation plan to be determined based on renal function  2.  AKI --Hold lasix for now --Gentle IV fluid resuscitation --Urine studies including U/A, spot urine sodium and creatinine --Renal ultrasound --Follow trend on BMP  3.  Elevated blood pressure without a history of HTN --Monitor for now  4.  History of steroid induced hyperglycemia --Down to prednisone 5mg  daily, will continue this dose now.  Outpatient rheumatology follow-up pending. --Low dose SSI if needed, glucose testing AC/HS --Regular diet for now  5.  Acute on chronic anemia --Continue iron and B12 supplements --Protonix since he is now being anticoagulated --Stool heme occult testing --Monitor for transfusion requirement/overt signs of bleeding   Code Status: FULL Family Communication: Wife and niece at bedside Disposition Plan: At least two midnights  Time spent: 75 minutes  [employer] [facility]  01/29/2015, 7:30  PM

## 2015-01-29 NOTE — ED Notes (Signed)
Pt sent here for positive doppler to right LE for DVT; pt sts recent PNA and increased SOB since then; pt on home O2

## 2015-01-29 NOTE — Telephone Encounter (Signed)
Spoke with Dominica Severin from vascular imaging [phone]). Pt has is positive for DVT in distal perineal vein and also has a new clot in mid-distal tibial vein. Pt has not left the imaging center and will not until we call back with recs.   MR please advise further. Thanks.

## 2015-01-29 NOTE — Progress Notes (Signed)
ANTICOAGULATION CONSULT NOTE - Initial Consult  Pharmacy Consult for Heparin Indication: pulmonary embolus/DVT  No Known Allergies  Patient Measurements: Height: 5\' 10"  (177.8 cm) Weight: 216 lb (97.977 kg) IBW/kg (Calculated) : 73 Heparin Dosing Weight: 93.3 kg  Vital Signs: Temp: 98 F (36.7 C) (05/18 1804) Temp Source: Oral (05/18 1804) BP: 160/75 mmHg (05/18 1804) Pulse Rate: 102 (05/18 1804)  Labs:  Recent Labs  01/29/15 1438  HGB 9.4*  HCT 29.7*  PLT 159  LABPROT 15.3*  INR 1.19  CREATININE 2.94*    Estimated Creatinine Clearance: 26.3 mL/min (by C-G formula based on Cr of 2.94).   Medical History: Past Medical History  Diagnosis Date  . Medical history non-contributory   . Arthritis   . Pneumonia   . GERD (gastroesophageal reflux disease)   . Sjogren's disease 01/28/2015    Medications:  Scheduled:  . heparin  5,000 Units Intravenous Once   Infusions:  . heparin      Assessment: 74 yo M with h/o recurrent PNA sent to ED on 01/29/2015 by Dr. Chase Caller for positive doppler study c/w right LE DVT. V/Q scan on admission with intermediate probability for PE and pharmacy consulted to dose heparin. H/H low, plt wnl with no reported significant s/s bleeding. SCr elevated to 2.94 (CrCl ~26 ml/min).   Goal of Therapy:  Heparin level 0.3-0.7 units/ml Monitor platelets by anticoagulation protocol: Yes   Plan:  - Initiate heparin 5000 units x 1, followed by 1550 u/hr (~17 u/kg/hr) - 8 hour HL - Monitor daily HL, CBC, and for s/s bleeding  Kimberlee Shoun K. Velva Harman, PharmD, [place] Clinical Pharmacist - Resident Pager: [phone] Pharmacy: [phone] 01/29/2015 6:29 PM

## 2015-01-29 NOTE — ED Notes (Signed)
Pt spoke with admitting Dr. Eulas Post about receiving RN, Tamran request to treat BP prior to pt going to the floor. Dr. Eulas Post states will place order.

## 2015-01-29 NOTE — ED Provider Notes (Signed)
CSN: [id number]     Arrival date & time 01/29/15  1414 History   First MD Initiated Contact with Patient 01/29/15 1455     Chief Complaint  Patient presents with  . DVT     (Consider location/radiation/quality/duration/timing/severity/associated sxs/prior Treatment) Patient is a 74 y.o. male presenting with shortness of breath. The history is provided by the patient. No language interpreter was used.  Shortness of Breath Severity:  Moderate Onset quality:  Gradual Duration:  1 month Timing:  Constant Progression:  Waxing and waning Chronicity:  Recurrent Context: activity   Relieved by:  Rest Worsened by:  Exertion and movement Ineffective treatments:  None tried Associated symptoms: chest pain and cough     Past Medical History  Diagnosis Date  . Medical history non-contributory   . Arthritis   . Pneumonia   . GERD (gastroesophageal reflux disease)   . Sjogren's disease 01/28/2015   Past Surgical History  Procedure Laterality Date  . Hemorroidectomy  1999  . Surgical procedure to remove a mole as a child Right Eye area    At around 30 years old   Family History  Problem Relation Age of Onset  . Hypertension Mother    History  Substance Use Topics  . Smoking status: Never Smoker   . Smokeless tobacco: Never Used  . Alcohol Use: No    Review of Systems  Respiratory: Positive for cough and shortness of breath.   Cardiovascular: Positive for chest pain.      Allergies  Review of patient's allergies indicates no known allergies.  Home Medications   Prior to Admission medications   Medication Sig Start Date End Date Taking? Authorizing Provider  cyanocobalamin 500 MCG tablet Take 1 tablet (500 mcg total) by mouth daily. 12/26/14   Donne Hazel, MD  feeding supplement, ENSURE ENLIVE, (ENSURE ENLIVE) LIQD Take 237 mLs by mouth 2 (two) times daily between meals. 01/09/15   Geradine Girt, DO  ferrous sulfate 325 (65 FE) MG tablet Take 1 tablet (325 mg total)  by mouth 2 (two) times daily with a meal. 12/26/14   Donne Hazel, MD  furosemide (LASIX) 40 MG tablet Take 1 tablet (40 mg total) by mouth daily. 01/28/15   Brand Males, MD  loratadine (CLARITIN) 10 MG tablet Take 10 mg by mouth daily.    Historical Provider, MD  nystatin (MYCOSTATIN) 100000 UNIT/ML suspension Take 5 mLs (500,000 Units total) by mouth 4 (four) times daily. For 7 days 01/09/15   Geradine Girt, DO  potassium chloride SA (KLOR-CON M20) 20 MEQ tablet Take 1 tablet (20 mEq total) by mouth daily. 01/28/15   Brand Males, MD  predniSONE (DELTASONE) 10 MG tablet Take 1 tablet (10 mg total) by mouth daily with breakfast. Patient taking differently: Take 5 mg by mouth daily with breakfast.  01/09/15   Geradine Girt, DO  traMADol-acetaminophen (ULTRACET) 37.5-325 MG per tablet Take 1-2 tablets by mouth every 4 (four) hours as needed for severe pain. 12/26/14   Donne Hazel, MD   BP 153/76 mmHg  Pulse 96  Temp(Src) 98 F (36.7 C) (Oral)  Resp 20  SpO2 100% Physical Exam  Constitutional: He is oriented to person, place, and time. He appears well-developed and well-nourished. No distress.  HENT:  Head: Normocephalic and atraumatic.  Mouth/Throat: No oropharyngeal exudate.  Eyes: Pupils are equal, round, and reactive to light.  Neck: Normal range of motion. Neck supple.  Cardiovascular: Normal rate, regular rhythm and normal heart  sounds.  Exam reveals no gallop and no friction rub.   No murmur heard. Pulmonary/Chest: Effort normal. No respiratory distress. He has no wheezes. He has rales.  Abdominal: Soft. Bowel sounds are normal. He exhibits no distension and no mass. There is no tenderness. There is no rebound and no guarding.  Musculoskeletal: Normal range of motion. He exhibits edema (2+ BLLE). He exhibits no tenderness.  Neurological: He is alert and oriented to person, place, and time.  Skin: Skin is warm and dry.  Psychiatric: He has a normal mood and affect.    ED  Course  Procedures (including critical care time) Labs Review Labs Reviewed  BASIC METABOLIC PANEL - Abnormal; Notable for the following:    Glucose, Bld 117 (*)    BUN 33 (*)    Creatinine, Ser 2.94 (*)    Calcium 8.5 (*)    GFR calc non Af Amer 20 (*)    GFR calc Af Amer 23 (*)    All other components within normal limits  PROTIME-INR - Abnormal; Notable for the following:    Prothrombin Time 15.3 (*)    All other components within normal limits  CBC WITH DIFFERENTIAL/PLATELET - Abnormal; Notable for the following:    WBC 15.6 (*)    RBC 3.09 (*)    Hemoglobin 9.4 (*)    HCT 29.7 (*)    RDW 22.5 (*)    Neutrophils Relative % 82 (*)    Lymphocytes Relative 9 (*)    Neutro Abs 12.8 (*)    Monocytes Absolute 1.2 (*)    All other components within normal limits  I-STAT TROPOININ, ED    Imaging Review Dg Chest 2 View  01/28/2015   CLINICAL DATA:  Pneumonia, followup  EXAM: CHEST  2 VIEW  COMPARISON:  Chest x-ray of 01/06/2015  FINDINGS: There has been worsening of parenchymal opacities in both lower lobes consistent with worsening of bilateral lower lobe pneumonia. A small right pleural effusion cannot be excluded. Mediastinal and hilar contours are unchanged. The heart is mildly enlarged and stable. No acute bony abnormality is seen.  IMPRESSION: Worsening of lower lobe opacities consistent with pneumonia.   Electronically Signed   By: Ivar Drape M.D.   On: 01/28/2015 14:47     EKG Interpretation None      MDM   Final diagnoses:  Dyspnea    Pt is a 74 y.o. male with Pmhx as above who presents with about 1 month of shortness of breath, exercise intolerance, and peripheral edema.  Patient has had a recent admission for recurrent pneumonia.  States he is still on antibiotics, but cannot tell me what.  He was seen by Dr. Chase Caller with pulmonary yesterday and a DVT study has been ordered, which returned as positive, and he was sent to the ED.  He states he sometimes has a  left-sided pleuritic chest pain.  He also has shortness of breath with exertion, though none with rest.  Currently.  He has had several weeks of bilateral lower extremity edema.  He has crackles in bilateral bases, 2+ BLLE edema. CXR from yesterday shows continued pna.   Labs from triage show ARF and Hb drop. Given new DVT, my suspicion for PE is high, VQ scan ordered. Pt will need medical admission even if negative given ARF.         Ernestina Patches, MD 01/29/15 1949

## 2015-01-29 NOTE — Telephone Encounter (Signed)
Spoke with MR--Pt is to go to closet ED [facility]) for admission for IV heparin. Called and spoke to [place] at the vascular imaging center and advised pt to go to [facility] ED. Rollene Fare verbalized understanding and will have pt transported to ED. Informed MR pt is going to [facility]. Nothing further needed at this time.

## 2015-01-30 ENCOUNTER — Encounter (HOSPITAL_COMMUNITY): Payer: Self-pay | Admitting: *Deleted

## 2015-01-30 ENCOUNTER — Inpatient Hospital Stay (HOSPITAL_COMMUNITY): Payer: Medicare Other

## 2015-01-30 DIAGNOSIS — I2699 Other pulmonary embolism without acute cor pulmonale: Secondary | ICD-10-CM

## 2015-01-30 LAB — GLUCOSE, CAPILLARY
GLUCOSE-CAPILLARY: 150 mg/dL — AB (ref 65–99)
Glucose-Capillary: 106 mg/dL — ABNORMAL HIGH (ref 65–99)
Glucose-Capillary: 81 mg/dL (ref 65–99)

## 2015-01-30 LAB — HEPATIC FUNCTION PANEL
ALBUMIN: 2 g/dL — AB (ref 3.5–5.0)
ALT: 13 U/L — ABNORMAL LOW (ref 17–63)
AST: 31 U/L (ref 15–41)
Alkaline Phosphatase: 65 U/L (ref 38–126)
Bilirubin, Direct: 0.2 mg/dL (ref 0.1–0.5)
Indirect Bilirubin: 0.7 mg/dL (ref 0.3–0.9)
TOTAL PROTEIN: 5.4 g/dL — AB (ref 6.5–8.1)
Total Bilirubin: 0.9 mg/dL (ref 0.3–1.2)

## 2015-01-30 LAB — MAGNESIUM: Magnesium: 2 mg/dL (ref 1.7–2.4)

## 2015-01-30 LAB — CBC
HCT: 24.9 % — ABNORMAL LOW (ref 39.0–52.0)
Hemoglobin: 7.8 g/dL — ABNORMAL LOW (ref 13.0–17.0)
MCH: 29.8 pg (ref 26.0–34.0)
MCHC: 31.3 g/dL (ref 30.0–36.0)
MCV: 95 fL (ref 78.0–100.0)
PLATELETS: 136 10*3/uL — AB (ref 150–400)
RBC: 2.62 MIL/uL — ABNORMAL LOW (ref 4.22–5.81)
RDW: 22.1 % — ABNORMAL HIGH (ref 11.5–15.5)
WBC: 13.5 10*3/uL — ABNORMAL HIGH (ref 4.0–10.5)

## 2015-01-30 LAB — BASIC METABOLIC PANEL
ANION GAP: 8 (ref 5–15)
BUN: 33 mg/dL — AB (ref 6–20)
CHLORIDE: 104 mmol/L (ref 101–111)
CO2: 25 mmol/L (ref 22–32)
Calcium: 7.9 mg/dL — ABNORMAL LOW (ref 8.9–10.3)
Creatinine, Ser: 3.04 mg/dL — ABNORMAL HIGH (ref 0.61–1.24)
GFR calc Af Amer: 22 mL/min — ABNORMAL LOW (ref >60)
GFR, EST NON AFRICAN AMERICAN: 19 mL/min — AB (ref >60)
Glucose, Bld: 112 mg/dL — ABNORMAL HIGH (ref 65–99)
Potassium: 4.1 mmol/L (ref 3.5–5.1)
SODIUM: 137 mmol/L (ref 135–145)

## 2015-01-30 LAB — ANTITHROMBIN III: AntiThromb III Func: 64 % — ABNORMAL LOW (ref 75–120)

## 2015-01-30 LAB — HEPARIN LEVEL (UNFRACTIONATED)
HEPARIN UNFRACTIONATED: 0.25 [IU]/mL — AB (ref 0.30–0.70)
HEPARIN UNFRACTIONATED: 0.41 [IU]/mL (ref 0.30–0.70)
Heparin Unfractionated: 0.38 IU/mL (ref 0.30–0.70)

## 2015-01-30 MED ORDER — HEPARIN (PORCINE) IN NACL 100-0.45 UNIT/ML-% IJ SOLN
1750.0000 [IU]/h | INTRAMUSCULAR | Status: AC
  Administered 2015-01-30: 1750 [IU]/h via INTRAVENOUS
  Filled 2015-01-30 (×5): qty 250

## 2015-01-30 MED ORDER — HEPARIN BOLUS VIA INFUSION
1000.0000 [IU] | Freq: Once | INTRAVENOUS | Status: AC
  Administered 2015-01-30: 1000 [IU] via INTRAVENOUS
  Filled 2015-01-30: qty 1000

## 2015-01-30 MED ORDER — DOCUSATE SODIUM 100 MG PO CAPS
100.0000 mg | ORAL_CAPSULE | Freq: Two times a day (BID) | ORAL | Status: DC
  Administered 2015-01-30 – 2015-02-19 (×39): 100 mg via ORAL
  Filled 2015-01-30 (×51): qty 1

## 2015-01-30 MED ORDER — POLYETHYLENE GLYCOL 3350 17 G PO PACK
17.0000 g | PACK | Freq: Every day | ORAL | Status: DC
Start: 2015-01-30 — End: 2015-02-19
  Administered 2015-01-30 – 2015-02-19 (×19): 17 g via ORAL
  Filled 2015-01-30 (×21): qty 1

## 2015-01-30 NOTE — Progress Notes (Signed)
UR Completed. Alicianna Litchford, RN, BSN.  [phone] 

## 2015-01-30 NOTE — Care Management Note (Addendum)
Case Management Note  Patient Details  Name: Travis [name] MRN: [id number] Date of Birth: July 02, 1941  Subjective/Objective:      Pt admitted with confirmed DVT              Action/Plan:  Anticoagulation planned during admit.  CM consulted for lovenox discharge planning.  CM will continue to monitor for disposition needs.   Expected Discharge Date:                  Expected Discharge Plan:  Home with [place] In-House Referral:     Discharge planning Services  CM Consult, Medication Assistance  Post Acute Care Choice:    Choice offered to:     DME Arranged:     DME Agency:     HH Arranged:   RN, PT resumption of services HH Agency:   [place]  Status of Service:   In process, will continue to follow  Medicare Important Message Given:  Yes Date Medicare IM Given:  01/30/15 Medicare IM give by:  Elenor Quinones Date Additional Medicare IM Given:    Additional Medicare Important Message give by:     If discussed at [place] of Stay Meetings, dates discussed:    Additional Comments: 01/31/15 Elenor Quinones, RN, BSN [phone]  01/31/15 Elenor Quinones, RN, BSN  [phone].  Wife successfully picked up prescription from pharmacy, instructed by CM to hold use of medication until post discharge/discharge instructions.  Wife stated she understood and medication would be taken home.  No other CM needs at this time.  01/31/15 Elenor Quinones, RN, BSN [phone].  Patient does not have prescription medication coverage, amount due for pt would have caused hardship.  MD requested to write prescription to be faxed to [facility] (cost of $30.62).  Prescription faxed, Gerald Stabs at pharmacy received prescription, currently working on filling.  Wife is on way to pick up prescription for pt and will keep pending discharge/discharge instructions.  MD instructed nurse to order resumption of [facility] services with [facility] (RN,PT).  Pt has home 02, states he has  transport tank in room for discharge.  No additional CM needs at this time.  01/30/15  Elenor Quinones, RN, BSN [phone].  CM submitted benefits check for Lovenox.  Pt states he already has walker and toilet chair and is currently active with [facility].  CM contacted [facility] to make aware of pt admission; pt receives home O2, RN and PT from [facility].  CM will request resumption of orders from MD.  CM was informed by pt that he has VA benefits.  CM contacted [facility] and spoke with Mickel Baas; pt is active for 40% coverage as it relates to pts prior approved condition with his knee only.   Pt states he lost his VA card.  Pt states he has a PCP at the [facility] clinic; Dr. Dell Ponto phone number [phone], last visited 5/12, this information was confirmed.  CM will continue to monitor for disposition needs.  Maryclare Labrador, RN 01/30/2015, 9:54 AM

## 2015-01-30 NOTE — Progress Notes (Addendum)
ANTICOAGULATION CONSULT NOTE - Follow up  Pharmacy Consult for Heparin Indication: pulmonary embolus/DVT  No Known Allergies  Patient Measurements: Height: 5\' 10"  (177.8 cm) Weight: 209 lb 11.2 oz (95.119 kg) IBW/kg (Calculated) : 73 Heparin Dosing Weight: 93.3 kg  Vital Signs: Temp: 98.5 F (36.9 C) (05/18 2045) Temp Source: Oral (05/18 2045) BP: 152/75 mmHg (05/18 2045) Pulse Rate: 103 (05/18 2045)  Labs:  Recent Labs  01/29/15 1438 01/30/15 0239  HGB 9.4* 7.8*  HCT 29.7* 24.9*  PLT 159 136*  LABPROT 15.3*  --   INR 1.19  --   HEPARINUNFRC  --  0.25*  CREATININE 2.94*  --     Estimated Creatinine Clearance: 25.9 mL/min (by C-G formula based on Cr of 2.94).   Medical History: Past Medical History  Diagnosis Date  . Medical history non-contributory   . Arthritis   . Pneumonia   . GERD (gastroesophageal reflux disease)   . Sjogren's disease 01/28/2015  . Pulmonary fibrosis     Medications:  Scheduled:  . cyanocobalamin  500 mcg Oral Daily  . ferrous sulfate  325 mg Oral BID WC  . insulin aspart  0-9 Units Subcutaneous TID WC  . pantoprazole  40 mg Oral Daily  . predniSONE  5 mg Oral Q breakfast  . sodium chloride  3 mL Intravenous Q12H   Infusions:  . sodium chloride 50 mL (01/29/15 2223)  . heparin 1,550 Units/hr (01/29/15 1823)    Assessment: 74 yo M with h/o recurrent PNA sent to ED on 01/29/2015 by Dr. Chase Caller for positive doppler study c/w right LE DVT. V/Q scan on admission with intermediate probability for PE and pharmacy consulted to dose heparin.  Initial 8 hour heparin level is 0.25 on 1550 units/hr and after 5000 units IV bolus. Hgb decreased to 7.8 from 9.4 and pltc down to 136K.  No bleeding reported per RN's report.  Goal of Therapy:  Heparin level 0.3-0.7 units/ml Monitor platelets by anticoagulation protocol: Yes   Plan:  - Give heparin 1000 units x 1 and increase heparin to 1750 u/hr  - 8 hour HL - Monitor daily HL, CBC, and  for s/s bleeding   Nicole Cella, RPh Clinical Pharmacist Pager: [phone] 01/30/2015 3:28 AM   Addendum: Recheck heparin level 0.38 therapeutic on 1750 units/hr.   Plan: Continue current rate.  Check confirmatory heparin level at 2000 F/u plans for oral anticoagulation.   Addendum: Confirmatory heparin level is therapeutic at 0.41. Continue heparin at 1750 units/hr and follow up AM labs.  Nena Jordan, PharmD, BCPS 01/30/2015, 8:37 PM

## 2015-01-30 NOTE — Progress Notes (Signed)
[name]  Travis [name] [id number] DOB: 1941-01-19 DOA: 01/29/2015 PCP: [name], MD  Assessment/Plan: 1-acute DVT with intermediate probability for PE -patient VS stable -denies current CP or significant SOB -started on heparin drip -given renal failure will hold on xarelto or eliquis at this moment; if renal function failed to returned to baseline will need coumadin -continue supportive care  2. Acute on chronic renal failure (stage 3 at baseline) --Holding lasix for now --Gentle IV fluid resuscitation --follow Urine studies including U/A, spot urine sodium and creatinine --Renal ultrasound --Follow creatinine trend on BMP  3. Elevated blood pressure without a history of HTN --Monitor for now  4. History of steroid induced hyperglycemia --Down to prednisone 5mg  daily, will continue this dose now. Outpatient rheumatology follow-up pending (scheduled for next week). --follow CBG's --will check A1C --Regular diet for now --continue SSI  5. Acute on chronic anemia --Continue iron and B12 supplements --Protonix since he is now being anticoagulated --will follow Stool heme occult testing --Monitor for transfusion requirement/overt signs of bleeding --Hgb currently above 7  Code Status: Full Family Communication: wife at bedside Disposition Plan: continue inpatient  Consultants: None  Antibiotics:  None   HPI/Subjective: Afebrile, denies CP and with some improvement in his breathing. Positive DVT on LE duplex and positive intermediate probability for PE n V/Q Scan  Objective: Filed Vitals:   01/30/15 1413  BP: 152/80  Pulse: 100  Temp: 98.1 F (36.7 C)  Resp: 18    Intake/Output Summary (Last 24 hours) at 01/30/15 1424 Last data filed at 01/30/15 1300  Gross per 24 hour  Intake    480 ml  Output    400 ml  Net     80 ml   Filed Weights   01/29/15 1804 01/29/15 2045  Weight: 97.977 kg (216 lb) 95.119 kg (209 lb 11.2  oz)    Exam:   General:  Afebrile, feeling weak and fatigued; denies CP, abd pain and just mild SOB  Cardiovascular: S1 and S2, no rubs or gallops  Respiratory: no wheezing, no crackles, good air movement   Abdomen: soft, NT, positive BS  Musculoskeletal: no cyanosis or clubbing; 2++ edema LE bilaterally  Data Reviewed: Basic Metabolic Panel:  Recent Labs Lab 01/29/15 1438 01/30/15 0239  NA 136 137  K 4.3 4.1  CL 102 104  CO2 24 25  GLUCOSE 117* 112*  BUN 33* 33*  CREATININE 2.94* 3.04*  CALCIUM 8.5* 7.9*   Liver Function Tests:  Recent Labs Lab 01/30/15 0239  AST 31  ALT 13*  ALKPHOS 65  BILITOT 0.9  PROT 5.4*  ALBUMIN 2.0*   CBC:  Recent Labs Lab 01/29/15 1438 01/30/15 0239  WBC 15.6* 13.5*  NEUTROABS 12.8*  --   HGB 9.4* 7.8*  HCT 29.7* 24.9*  MCV 96.1 95.0  PLT 159 136*   BNP (last 3 results)  Recent Labs  12/17/14 1934 01/02/15 1241 01/06/15 1758  BNP 38.6 66.6 83.3   CBG:  Recent Labs Lab 01/29/15 2221 01/30/15 0620 01/30/15 1115  GLUCAP 86 106* 150*   Studies: Dg Chest 2 View  01/29/2015   CLINICAL DATA:  Pneumonia, shortness of breath.  EXAM: CHEST  2 VIEW  COMPARISON:  Jan 28, 2015.  FINDINGS: Stable cardiomediastinal silhouette. No pneumothorax is noted. Stable bibasilar opacities are noted consistent with pneumonia with probable minimal associated pleural effusions. Bony thorax appears intact.  IMPRESSION: Stable bibasilar opacities are noted most consistent with pneumonia. Continued radiographic follow-up is recommended  to ensure resolution.   Electronically Signed   By: Marijo Conception, M.D.   On: 01/29/2015 17:51   Dg Chest 2 View  01/28/2015   CLINICAL DATA:  Pneumonia, followup  EXAM: CHEST  2 VIEW  COMPARISON:  Chest x-ray of 01/06/2015  FINDINGS: There has been worsening of parenchymal opacities in both lower lobes consistent with worsening of bilateral lower lobe pneumonia. A small right pleural effusion cannot be  excluded. Mediastinal and hilar contours are unchanged. The heart is mildly enlarged and stable. No acute bony abnormality is seen.  IMPRESSION: Worsening of lower lobe opacities consistent with pneumonia.   Electronically Signed   By: Ivar Drape M.D.   On: 01/28/2015 14:47   US Renal  01/30/2015   CLINICAL DATA:  Acute renal failure.  Sjogren's disease.  EXAM: RENAL / URINARY TRACT ULTRASOUND COMPLETE  COMPARISON:  None.  FINDINGS: Right Kidney:  Length: 12.5 cm. Echogenicity within normal limits. No mass or hydronephrosis visualized.  Left Kidney:  Length: 13.7 cm. Echogenicity within normal limits. No mass or hydronephrosis visualized.  Bladder:  Bladder is nondistended. Mild bladder wall prominence is most likely secondary to nondistended state.  IMPRESSION: Normal exam.   Electronically Signed   By: Marcello Moores  Register   On: 01/30/2015 07:40   Nm Pulmonary Perf And Vent  01/29/2015   CLINICAL DATA:  Shortness of breath for 2 weeks. Known deep venous thrombosis .  EXAM: NUCLEAR MEDICINE VENTILATION - PERFUSION LUNG SCAN  TECHNIQUE: Ventilation images were obtained in multiple projections using inhaled aerosol Tc-47m DTPA. Perfusion images were obtained in multiple projections after intravenous injection of Tc-47m MAA.  RADIOPHARMACEUTICALS:  40.0 Technetium-24m DTPA aerosol inhalation and 6.0 Technetium-33m MAA IV  COMPARISON:  Chest radiograph same date, which demonstrate left greater than right bibasilar airspace disease.  FINDINGS: Ventilation: Vague decreased ventilation involving the left greater than right lung bases, corresponding to airspace disease on chest radiograph.  Perfusion: Vague decreased perfusion within the same areas of the lung bases, greater on the left.  IMPRESSION: Triple matched defects within the lung bases, greater on the left than right. Intermediate probability for pulmonary embolism.   Electronically Signed   By: Abigail Miyamoto M.D.   On: 01/29/2015 17:37    Scheduled Meds: .  cyanocobalamin  500 mcg Oral Daily  . docusate sodium  100 mg Oral BID  . ferrous sulfate  325 mg Oral BID WC  . insulin aspart  0-9 Units Subcutaneous TID WC  . pantoprazole  40 mg Oral Daily  . polyethylene glycol  17 g Oral Daily  . predniSONE  5 mg Oral Q breakfast  . sodium chloride  3 mL Intravenous Q12H   Continuous Infusions: . sodium chloride 50 mL (01/29/15 2223)  . heparin 1,750 Units/hr (01/30/15 0345)    Principal Problem:   Acute DVT of right tibial vein Active Problems:   Elevated blood pressure   Steroid-induced hyperglycemia   Peripheral edema   Physical deconditioning   Sjogren's disease   Acute kidney injury   DVT (deep venous thrombosis)    Time spent: 30 minutes   Barton Dubois  [facility] Pager [phone]. If 7PM-7AM, please contact night-coverage at [email or web address], password [email or web address] 01/30/2015, 2:24 PM  LOS: 1 day

## 2015-01-31 DIAGNOSIS — N189 Chronic kidney disease, unspecified: Secondary | ICD-10-CM

## 2015-01-31 DIAGNOSIS — N179 Acute kidney failure, unspecified: Secondary | ICD-10-CM | POA: Diagnosis present

## 2015-01-31 LAB — HOMOCYSTEINE: Homocysteine: 15.4 umol/L — ABNORMAL HIGH (ref 0.0–15.0)

## 2015-01-31 LAB — PROTEIN C ACTIVITY: Protein C Activity: 106 % (ref 74–151)

## 2015-01-31 LAB — HEMOGLOBIN A1C
HEMOGLOBIN A1C: 4.6 % — AB (ref 4.8–5.6)
Hgb A1c MFr Bld: 4.9 % (ref 4.8–5.6)
MEAN PLASMA GLUCOSE: 85 mg/dL
Mean Plasma Glucose: 94 mg/dL

## 2015-01-31 LAB — CBC
HCT: 25.1 % — ABNORMAL LOW (ref 39.0–52.0)
Hemoglobin: 7.7 g/dL — ABNORMAL LOW (ref 13.0–17.0)
MCH: 29.5 pg (ref 26.0–34.0)
MCHC: 30.7 g/dL (ref 30.0–36.0)
MCV: 96.2 fL (ref 78.0–100.0)
PLATELETS: 142 10*3/uL — AB (ref 150–400)
RBC: 2.61 MIL/uL — ABNORMAL LOW (ref 4.22–5.81)
RDW: 22.6 % — AB (ref 11.5–15.5)
WBC: 13.4 10*3/uL — ABNORMAL HIGH (ref 4.0–10.5)

## 2015-01-31 LAB — GLUCOSE, CAPILLARY
Glucose-Capillary: 108 mg/dL — ABNORMAL HIGH (ref 65–99)
Glucose-Capillary: 104 mg/dL — ABNORMAL HIGH (ref 65–99)
Glucose-Capillary: 124 mg/dL — ABNORMAL HIGH (ref 65–99)

## 2015-01-31 LAB — LUPUS ANTICOAGULANT PANEL
DRVVT: 36.5 s (ref 0.0–55.1)
PTT Lupus Anticoagulant: 59.9 s — ABNORMAL HIGH (ref 0.0–50.0)

## 2015-01-31 LAB — PTT-LA MIX: PTT-LA Mix: 48.1 s (ref 0.0–50.0)

## 2015-01-31 LAB — PTT-LA INCUB MIX: PTT-LA INCUB MIX: 51.3 s — AB (ref 0.0–50.0)

## 2015-01-31 LAB — PROTEIN S, TOTAL: Protein S Ag, Total: 96 % (ref 58–150)

## 2015-01-31 LAB — BASIC METABOLIC PANEL
Anion gap: 7 (ref 5–15)
BUN: 29 mg/dL — AB (ref 6–20)
CALCIUM: 8.1 mg/dL — AB (ref 8.9–10.3)
CHLORIDE: 106 mmol/L (ref 101–111)
CO2: 24 mmol/L (ref 22–32)
Creatinine, Ser: 3.08 mg/dL — ABNORMAL HIGH (ref 0.61–1.24)
GFR calc Af Amer: 22 mL/min — ABNORMAL LOW (ref >60)
GFR, EST NON AFRICAN AMERICAN: 19 mL/min — AB (ref >60)
Glucose, Bld: 102 mg/dL — ABNORMAL HIGH (ref 65–99)
POTASSIUM: 4 mmol/L (ref 3.5–5.1)
SODIUM: 137 mmol/L (ref 135–145)

## 2015-01-31 LAB — PROTEIN S ACTIVITY: PROTEIN S ACTIVITY: 57 % — AB (ref 60–145)

## 2015-01-31 LAB — HEPARIN LEVEL (UNFRACTIONATED): Heparin Unfractionated: 0.35 IU/mL (ref 0.30–0.70)

## 2015-01-31 LAB — HEXAGONAL PHASE PHOSPHOLIPID: HEXAGONAL PHASE PHOSPHOLIPID: 20.7 s — AB (ref 0.0–8.0)

## 2015-01-31 MED ORDER — PANTOPRAZOLE SODIUM 40 MG PO TBEC: 40.0000 mg | DELAYED_RELEASE_TABLET | Freq: Every day | ORAL | Status: DC

## 2015-01-31 MED ORDER — ENOXAPARIN SODIUM 100 MG/ML ~~LOC~~ SOLN
100.0000 mg | SUBCUTANEOUS | Status: DC
  Administered 2015-01-31 – 2015-02-01 (×2): 100 mg via SUBCUTANEOUS
  Filled 2015-01-31 (×3): qty 1

## 2015-01-31 MED ORDER — PATIENT'S GUIDE TO USING COUMADIN BOOK
Freq: Once | Status: AC
  Administered 2015-01-31: 12:00:00
  Filled 2015-01-31: qty 1

## 2015-01-31 MED ORDER — HYDRALAZINE HCL 20 MG/ML IJ SOLN: 10.0000 mg | Freq: Three times a day (TID) | INTRAMUSCULAR | Status: DC | PRN

## 2015-01-31 MED ORDER — ENOXAPARIN (LOVENOX) PATIENT EDUCATION KIT
PACK | Freq: Once | Status: AC
  Administered 2015-01-31: 12:00:00
  Filled 2015-01-31: qty 1

## 2015-01-31 MED ORDER — WARFARIN - PHARMACIST DOSING INPATIENT
Freq: Every day | Status: DC
  Administered 2015-02-01 – 2015-02-06 (×3)

## 2015-01-31 MED ORDER — WARFARIN VIDEO
Freq: Once | Status: AC
  Administered 2015-01-31: 13:00:00

## 2015-01-31 MED ORDER — ENOXAPARIN SODIUM 100 MG/ML ~~LOC~~ SOLN: 100.0000 mg | SUBCUTANEOUS | Status: DC

## 2015-01-31 MED ORDER — WARFARIN SODIUM 7.5 MG PO TABS
7.5000 mg | ORAL_TABLET | Freq: Once | ORAL | Status: AC
  Administered 2015-01-31: 7.5 mg via ORAL
  Filled 2015-01-31: qty 1

## 2015-01-31 NOTE — Progress Notes (Signed)
ANTICOAGULATION CONSULT NOTE - Follow Up Consult  Pharmacy Consult for Heparin -> Lovenox and Coumadin Indication: pulmonary embolus and DVT  No Known Allergies  Patient Measurements: Height: 5\' 10"  (177.8 cm) Weight: 209 lb 11.2 oz (95.119 kg) IBW/kg (Calculated) : 73  Vital Signs: Temp: 98.3 F (36.8 C) (05/20 0423) Temp Source: Oral (05/20 0423) BP: 135/60 mmHg (05/20 0423) Pulse Rate: 106 (05/20 0423)  Labs:  Recent Labs  01/29/15 1438  01/30/15 0239 01/30/15 1213 01/30/15 1957 01/31/15 0335  HGB 9.4*  --  7.8*  --   --  7.7*  HCT 29.7*  --  24.9*  --   --  25.1*  PLT 159  --  136*  --   --  142*  LABPROT 15.3*  --   --   --   --   --   INR 1.19  --   --   --   --   --   HEPARINUNFRC  --   < > 0.25* 0.38 0.41 0.35  CREATININE 2.94*  --  3.04*  --   --  3.08*  < > = values in this interval not displayed.  Estimated Creatinine Clearance: 24.7 mL/min (by C-G formula based on Cr of 3.08).  Assessment:   New right LE DVT and intermediate probability of PE per VQ scan.  Transitioning from IV heparin to Lovenox and Coumadin today.  Overlap day # 1 of 5 minimum.   May consider changing to Eliquis in the future if renal function improves.   CBC low stable. Albumin only 2.0, so may be sensitive to Coumadin doses.  To learn self-administration of Lovenox.  Goal of Therapy:  INR 2-3 Anti-Xa level 0.6-1 units/ml 4hrs after LMWH dose given Monitor platelets by anticoagulation protocol: Yes   Plan:    Stop IV heparin at 11am.    Begin Lovenox 100 mg sq q24hrs at 12noon.   Coumadin 7.5 mg x 1 today.   Daily PT/INR.   Coumadin book/video ordered. He watched the video.   Discussed Coumadin use, monitoring and precautions with patient and daughter.  He would like [place] to discuss with his wife when she is here.    Arty Baumgartner, [place] Pager: [phone] 01/31/2015,12:31 PM

## 2015-01-31 NOTE — Discharge Instructions (Signed)

## 2015-01-31 NOTE — Progress Notes (Signed)
[name]  Travis [name] [id number] DOB: May 13, 1941 DOA: 01/29/2015 PCP: [name], MD  Assessment/Plan: 1-acute DVT with intermediate probability for PE -patient VS stable -denies current CP or significant SOB; will titrate O2 down as toelrated -given renal failure will hold on xarelto or eliquis at this moment; will initiate bridging with lovenox and coumadin -continue supportive care  2. Acute on chronic renal failure (stage 3 at baseline) --Holding lasix for now --Gentle IV fluid resuscitation --no signs of UTI --Renal ultrasound, normal exam --Follow creatinine trend on BMP  3. Elevated blood pressure without a history of HTN --Monitor for now --PRN hydralazine for SBP > 180 and/or DBP > 110  4. History of steroid induced hyperglycemia --Down to prednisone 5mg  daily, will continue this dose now. Outpatient rheumatology follow-up pending (scheduled for next week). --follow CBG's --A1C 4.9 --continue SSI (with main intentions to cover for CBG's > 200)  5. Acute on chronic anemia --Continue iron and B12 supplements --Protonix since he is now being anticoagulated --will follow Stool heme occult testing --Monitor for transfusion requirement/overt signs of bleeding --Hgb currently above 7.7  Code Status: Full Family Communication: wife at bedside Disposition Plan: continue inpatient  Consultants: None  Antibiotics:  None   HPI/Subjective: Afebrile, denies CP and feeling better. Reports breathing is improving. Renal function has remained with Cr in 3 range  Objective: Filed Vitals:   01/31/15 1443  BP: 144/81  Pulse: 101  Temp: 99.3 F (37.4 C)  Resp: 18    Intake/Output Summary (Last 24 hours) at 01/31/15 1652 Last data filed at 01/31/15 1300  Gross per 24 hour  Intake    600 ml  Output    900 ml  Net   -300 ml   Filed Weights   01/29/15 1804 01/29/15 2045  Weight: 97.977 kg (216 lb) 95.119 kg (209 lb 11.2 oz)     Exam:   General:  Afebrile, feeling better, no CP, still weak/fatigued; No abd pain and just mild SOB  Cardiovascular: S1 and S2, no rubs or gallops  Respiratory: no wheezing, no crackles, good air movement   Abdomen: soft, NT, positive BS  Musculoskeletal: no cyanosis or clubbing; 2++ edema LE bilaterally  Data Reviewed: Basic Metabolic Panel:  Recent Labs Lab 01/29/15 1438 01/30/15 0239 01/30/15 2155 01/31/15 0335  NA 136 137  --  137  K 4.3 4.1  --  4.0  CL 102 104  --  106  CO2 24 25  --  24  GLUCOSE 117* 112*  --  102*  BUN 33* 33*  --  29*  CREATININE 2.94* 3.04*  --  3.08*  CALCIUM 8.5* 7.9*  --  8.1*  MG  --   --  2.0  --    Liver Function Tests:  Recent Labs Lab 01/30/15 0239  AST 31  ALT 13*  ALKPHOS 65  BILITOT 0.9  PROT 5.4*  ALBUMIN 2.0*   CBC:  Recent Labs Lab 01/29/15 1438 01/30/15 0239 01/31/15 0335  WBC 15.6* 13.5* 13.4*  NEUTROABS 12.8*  --   --   HGB 9.4* 7.8* 7.7*  HCT 29.7* 24.9* 25.1*  MCV 96.1 95.0 96.2  PLT 159 136* 142*   BNP (last 3 results)  Recent Labs  12/17/14 1934 01/02/15 1241 01/06/15 1758  BNP 38.6 66.6 83.3   CBG:  Recent Labs Lab 01/30/15 0620 01/30/15 1115 01/30/15 1608 01/31/15 0603 01/31/15 1111  GLUCAP 106* 150* 81 108* 104*   Studies: Dg Chest 2 View  01/29/2015   CLINICAL DATA:  Pneumonia, shortness of breath.  EXAM: CHEST  2 VIEW  COMPARISON:  Jan 28, 2015.  FINDINGS: Stable cardiomediastinal silhouette. No pneumothorax is noted. Stable bibasilar opacities are noted consistent with pneumonia with probable minimal associated pleural effusions. Bony thorax appears intact.  IMPRESSION: Stable bibasilar opacities are noted most consistent with pneumonia. Continued radiographic follow-up is recommended to ensure resolution.   Electronically Signed   By: Marijo Conception, M.D.   On: 01/29/2015 17:51   US Renal  01/30/2015   CLINICAL DATA:  Acute renal failure.  Sjogren's disease.  EXAM: RENAL  / URINARY TRACT ULTRASOUND COMPLETE  COMPARISON:  None.  FINDINGS: Right Kidney:  Length: 12.5 cm. Echogenicity within normal limits. No mass or hydronephrosis visualized.  Left Kidney:  Length: 13.7 cm. Echogenicity within normal limits. No mass or hydronephrosis visualized.  Bladder:  Bladder is nondistended. Mild bladder wall prominence is most likely secondary to nondistended state.  IMPRESSION: Normal exam.   Electronically Signed   By: Marcello Moores  Register   On: 01/30/2015 07:40   Nm Pulmonary Perf And Vent  01/29/2015   CLINICAL DATA:  Shortness of breath for 2 weeks. Known deep venous thrombosis .  EXAM: NUCLEAR MEDICINE VENTILATION - PERFUSION LUNG SCAN  TECHNIQUE: Ventilation images were obtained in multiple projections using inhaled aerosol Tc-61m DTPA. Perfusion images were obtained in multiple projections after intravenous injection of Tc-37m MAA.  RADIOPHARMACEUTICALS:  40.0 Technetium-51m DTPA aerosol inhalation and 6.0 Technetium-64m MAA IV  COMPARISON:  Chest radiograph same date, which demonstrate left greater than right bibasilar airspace disease.  FINDINGS: Ventilation: Vague decreased ventilation involving the left greater than right lung bases, corresponding to airspace disease on chest radiograph.  Perfusion: Vague decreased perfusion within the same areas of the lung bases, greater on the left.  IMPRESSION: Triple matched defects within the lung bases, greater on the left than right. Intermediate probability for pulmonary embolism.   Electronically Signed   By: Abigail Miyamoto M.D.   On: 01/29/2015 17:37    Scheduled Meds: . cyanocobalamin  500 mcg Oral Daily  . docusate sodium  100 mg Oral BID  . enoxaparin (LOVENOX) injection  100 mg Subcutaneous Q24H  . ferrous sulfate  325 mg Oral BID WC  . insulin aspart  0-9 Units Subcutaneous TID WC  . pantoprazole  40 mg Oral Daily  . polyethylene glycol  17 g Oral Daily  . predniSONE  5 mg Oral Q breakfast  . sodium chloride  3 mL Intravenous  Q12H  . Warfarin - Pharmacist Dosing Inpatient   Does not apply q1800   Continuous Infusions: . sodium chloride 50 mL/hr at 01/31/15 0201    Principal Problem:   Acute DVT of right tibial vein Active Problems:   Elevated blood pressure   Steroid-induced hyperglycemia   Peripheral edema   Physical deconditioning   Sjogren's disease   Acute kidney injury   DVT (deep venous thrombosis)    Time spent: 30 minutes   Barton Dubois  [facility] Pager [phone]. If 7PM-7AM, please contact night-coverage at [email or web address], password [facility] 01/31/2015, 4:52 PM  LOS: 2 days

## 2015-02-01 DIAGNOSIS — I82401 Acute embolism and thrombosis of unspecified deep veins of right lower extremity: Secondary | ICD-10-CM

## 2015-02-01 LAB — CBC
HEMATOCRIT: 24.3 % — AB (ref 39.0–52.0)
Hemoglobin: 7.6 g/dL — ABNORMAL LOW (ref 13.0–17.0)
MCH: 30.3 pg (ref 26.0–34.0)
MCHC: 31.3 g/dL (ref 30.0–36.0)
MCV: 96.8 fL (ref 78.0–100.0)
Platelets: 143 10*3/uL — ABNORMAL LOW (ref 150–400)
RBC: 2.51 MIL/uL — ABNORMAL LOW (ref 4.22–5.81)
RDW: 22.6 % — ABNORMAL HIGH (ref 11.5–15.5)
WBC: 12.4 10*3/uL — ABNORMAL HIGH (ref 4.0–10.5)

## 2015-02-01 LAB — PROTIME-INR
INR: 1.29 (ref 0.00–1.49)
Prothrombin Time: 16.3 seconds — ABNORMAL HIGH (ref 11.6–15.2)

## 2015-02-01 LAB — BASIC METABOLIC PANEL
ANION GAP: 8 (ref 5–15)
BUN: 30 mg/dL — AB (ref 6–20)
CALCIUM: 8 mg/dL — AB (ref 8.9–10.3)
CHLORIDE: 105 mmol/L (ref 101–111)
CO2: 22 mmol/L (ref 22–32)
Creatinine, Ser: 3.38 mg/dL — ABNORMAL HIGH (ref 0.61–1.24)
GFR, EST AFRICAN AMERICAN: 19 mL/min — AB (ref >60)
GFR, EST NON AFRICAN AMERICAN: 17 mL/min — AB (ref >60)
GLUCOSE: 93 mg/dL (ref 65–99)
Potassium: 4.1 mmol/L (ref 3.5–5.1)
Sodium: 135 mmol/L (ref 135–145)

## 2015-02-01 LAB — CARDIOLIPIN ANTIBODIES, IGG, IGM, IGA
Anticardiolipin IgG: <9 GPL U/mL (ref 0–14)
Anticardiolipin IgM: <9 MPL U/mL (ref 0–12)

## 2015-02-01 LAB — GLUCOSE, CAPILLARY
Glucose-Capillary: 93 mg/dL (ref 65–99)
Glucose-Capillary: 108 mg/dL — ABNORMAL HIGH (ref 65–99)
Glucose-Capillary: 119 mg/dL — ABNORMAL HIGH (ref 65–99)
Glucose-Capillary: 108 mg/dL — ABNORMAL HIGH (ref 65–99)

## 2015-02-01 LAB — PROTEIN / CREATININE RATIO, URINE
Creatinine, Urine: 67.22 mg/dL
Protein Creatinine Ratio: 1.49 mg/mg{Cre} — ABNORMAL HIGH (ref 0.00–0.15)
TOTAL PROTEIN, URINE: 100 mg/dL

## 2015-02-01 LAB — PROTEIN C, TOTAL: Protein C, Total: 79 % (ref 70–140)

## 2015-02-01 MED ORDER — WARFARIN SODIUM 7.5 MG PO TABS
7.5000 mg | ORAL_TABLET | Freq: Once | ORAL | Status: AC
  Administered 2015-02-01: 7.5 mg via ORAL
  Filled 2015-02-01: qty 1

## 2015-02-01 MED ORDER — METHYLPREDNISOLONE SODIUM SUCC 1000 MG IJ SOLR
250.0000 mg | Freq: Three times a day (TID) | INTRAMUSCULAR | Status: DC
  Administered 2015-02-01 – 2015-02-04 (×8): 250 mg via INTRAVENOUS
  Filled 2015-02-01 (×10): qty 2

## 2015-02-01 NOTE — Consult Note (Signed)
Renal Service Consult Note [place] 02/01/2015 [place] Requesting Physician:  Dr Dyann Kief  Reason for Consult:  Renal failure HPI: The patient is a 74 y.o. year-old with hx of DJD and GERD, otherwise healthy until April 2016 in hospital twice with pulm infiltrates (pna vs fibrosis), cough, SOB, LE edema. Work up was ANA negative but was + for RF, antiRo and antiLa and for ANCA. He has been treated with some oral prednisone. He was ordered for home O2 by his PCP the week prior to admission. On 5/18 presented to ED with severe fatigue, LE edema. No SOB or CP. Was seen in pulm clinic and dopplers were + for DVT R leg below the knee.  Admitted and started on IV heparin. Creat was up, new since last hosp stay, 2.94 on admission and up to 3.38 today . Asked to see for renal failure. Alb 2.0, WBC 12, Hb 7.6, plts 143.     April 2016 >  Urine IFE and SPEP no monoclonal spike Serum free LC ratio 1.74 (just slightly high) Anti Smith, anti-RNA, anti JoAb's negative SS-A ++, SS-B slightly +, RF +28 (0-13.9) C-ANCA, P-ANCA, atypical ANCA negative    Chart review: 4/5- 12/26/14 > anasarca, CAP, post infectious pulm fibrosis, HTN, fever, anemia, joint pains Rx with prednisone. ANA neg, SSA / SSB positive 4/25 - 01/09/15 > SOB and cough/ congestion, failed 7d abx course as OP. Feet more swollen. 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 HCAP, poss autoimmune disorder.  RF+, antiRo/ antiLa +, ANCA pr-3 positive. ANA negative / also MPO and pr-3 titers were negative    ROS  denies rash  +mouth sores recnetly  no joitn pain  no abd pain  no nsaids  Past Medical History  Past Medical History  Diagnosis Date  . Medical history non-contributory   . Arthritis   . Pneumonia   . GERD (gastroesophageal reflux disease)   . Sjogren's disease 01/28/2015  . Pulmonary fibrosis    Past Surgical History  Past Surgical History  Procedure  Laterality Date  . Hemorroidectomy  1999  . Surgical procedure to remove a mole as a child Right Eye area    At around 32 years old   Family History  Family History  Problem Relation Age of Onset  . Hypertension Mother    Social History  reports that he has never smoked. He has never used smokeless tobacco. He reports that he does not drink alcohol or use illicit drugs. Allergies No Known Allergies Home medications Prior to Admission medications   Medication Sig Start Date End Date Taking? Authorizing Provider  cyanocobalamin 500 MCG tablet Take 1 tablet (500 mcg total) by mouth daily. 12/26/14  Yes Donne Hazel, MD  feeding supplement, ENSURE ENLIVE, (ENSURE ENLIVE) LIQD Take 237 mLs by mouth 2 (two) times daily between meals. 01/09/15  Yes Geradine Girt, DO  ferrous sulfate 325 (65 FE) MG tablet Take 1 tablet (325 mg total) by mouth 2 (two) times daily with a meal. 12/26/14  Yes Donne Hazel, MD  furosemide (LASIX) 40 MG tablet Take 1 tablet (40 mg total) by mouth daily. 01/28/15  Yes Brand Males, MD  loratadine (CLARITIN) 10 MG tablet Take 10 mg by mouth daily.   Yes Historical Provider, MD  nystatin (MYCOSTATIN) 100000 UNIT/ML suspension Take 5 mLs (500,000 Units total) by mouth 4 (four) times daily. For 7 days 01/09/15  Yes Geradine Girt, DO  potassium chloride SA (KLOR-CON M20) 20 MEQ tablet Take 1 tablet (20 mEq total) by mouth daily. 01/28/15  Yes Brand Males, MD  predniSONE (DELTASONE) 10 MG tablet Take 1 tablet (10 mg total) by mouth daily with breakfast. Patient taking differently: Take 5 mg by mouth daily with breakfast.  01/09/15  Yes Geradine Girt, DO  traMADol-acetaminophen (ULTRACET) 37.5-325 MG per tablet Take 1-2 tablets by mouth every 4 (four) hours as needed for severe pain. 12/26/14  Yes Donne Hazel, MD  enoxaparin (LOVENOX) 100 MG/ML injection Inject 1 mL (100 mg total) into the skin daily. 01/31/15   Barton Dubois, MD  pantoprazole (PROTONIX) 40 MG tablet  Take 1 tablet (40 mg total) by mouth daily. 01/31/15   Barton Dubois, MD   Liver Function Tests  Recent Labs Lab 01/30/15 0239  AST 31  ALT 13*  ALKPHOS 65  BILITOT 0.9  PROT 5.4*  ALBUMIN 2.0*   No results for input(s): LIPASE, AMYLASE in the last 168 hours. CBC  Recent Labs Lab 01/29/15 1438 01/30/15 0239 01/31/15 0335 02/01/15 0357  WBC 15.6* 13.5* 13.4* 12.4*  NEUTROABS 12.8*  --   --   --   HGB 9.4* 7.8* 7.7* 7.6*  HCT 29.7* 24.9* 25.1* 24.3*  MCV 96.1 95.0 96.2 96.8  PLT 159 136* 142* [id number]*   Basic Metabolic Panel  Recent Labs Lab 01/29/15 1438 01/30/15 0239 01/31/15 0335 02/01/15 0357  NA 136 137 137 135  K 4.3 4.1 4.0 4.1  CL 102 104 106 105  CO2 24 25 24 22   GLUCOSE 117* 112* 102* 93  BUN 33* 33* 29* 30*  CREATININE 2.94* 3.04* 3.08* 3.38*  CALCIUM 8.5* 7.9* 8.1* 8.0*    Filed Vitals:   01/31/15 1443 01/31/15 2018 02/01/15 0336 02/01/15 1613  BP: 144/81 144/78 157/79 152/81  Pulse: 101 103 98 101  Temp: 99.3 F (37.4 C) 99.5 F (37.5 C) 98.2 F (36.8 C) 99.1 F (37.3 C)  TempSrc: Oral Oral Oral Oral  Resp: 18 18 18 19   Height:      Weight:      SpO2: 100% 94% 95% 98%   Exam Alert, looks dyspneic, not in distress, lying 30 deg No rash, cyanosis or gangrene Sclera anicteric, throat clear, no lesions or thrush Neck no jvd or bruits, JVP 8-10 cm Chest diffuse fine 'velcro' rales 1/2 up bilat RRR no rub, M or gallop, precordium quiet Abd soft, ntnd no ascites, liver down 5 cm GU nl male 2+pitting pretib edema bilat No LE lesions, wounds , petechiae/purpura Neuro is alert, Ox 3   CXR bibasilar disease, worsening since April '16 CT chest 4/7 -- bibasilar interstitial fibrosis with some RLL consolidation CT chset 4/21 -- worsening bibasilar disease compared to 4/7 w similar patterning UA 5/18 7-10 rbc , 100 prot, 1.013, 0-2 wbc, large Hb UA 5/21 (undersigned) diffuse granular casts, occ RBC in casts, one WBC cast, 15- 25 rbc/hpf, 1-3  wbc/hpf Renal US 12-14 cm kidneys, no hydro  Assessment: 1. Acute renal failure - in patient with recently diagnosed autoimmune disease (pulm fibrosis, hypoxemia, LE edema and RF/SSA/ SSB+) felt to be c/w Sjogren's, now with significant renal failure.  Patient has active sediment for GN. With Sjogren's cryoglobulinemic GN would be most common cause of this presentation. Sjogren's also causes acute or chronic TIN, and has been associated with FSGS.  Needs high-dose IV solumedrol now and kidney biopsy which we will schedule asap. Ordered serum cryoglobulins.  Have explained to  pt and family present. LE edema may be from renal failure, neph syndrome and/or R HF from pulm HTN.  Check urine prot:creat ratio. ECHO did not show any LV failure. Will follow.    Plan- as above.   Kelly Splinter MD (pgr) [phone]    [phone] 02/01/2015, 6:55 PM

## 2015-02-01 NOTE — Progress Notes (Signed)
ANTICOAGULATION CONSULT NOTE - Follow Up Consult  Pharmacy Consult for Heparin -> Lovenox and Coumadin Indication: pulmonary embolus and DVT  No Known Allergies  Patient Measurements: Height: 5\' 10"  (177.8 cm) Weight: 209 lb 11.2 oz (95.119 kg) IBW/kg (Calculated) : 73  Vital Signs: Temp: 98.2 F (36.8 C) (05/21 0336) Temp Source: Oral (05/21 0336) BP: 157/79 mmHg (05/21 0336) Pulse Rate: 98 (05/21 0336)  Labs:  Recent Labs  01/29/15 1438  01/30/15 0239 01/30/15 1213 01/30/15 1957 01/31/15 0335 02/01/15 0357  HGB 9.4*  --  7.8*  --   --  7.7* 7.6*  HCT 29.7*  --  24.9*  --   --  25.1* 24.3*  PLT 159  --  136*  --   --  142* 143*  LABPROT 15.3*  --   --   --   --   --  16.3*  INR 1.19  --   --   --   --   --  1.29  HEPARINUNFRC  --   < > 0.25* 0.38 0.41 0.35  --   CREATININE 2.94*  --  3.04*  --   --  3.08* 3.38*  < > = values in this interval not displayed.  Estimated Creatinine Clearance: 22.5 mL/min (by C-G formula based on Cr of 3.38).  Assessment: 47 YOM with new right LE DVT and intermediate probability of PE per VQ scan. On lovenox/coumadin overlap day # 2/5. INR 1.29 after one dose of 7.5 mg yesterday. CBC low stable. Albumin only 2.0, so may be sensitive to Coumadin doses.  To learn self-administration of Lovenox. Scr remains elevated 3.38, with est. crcl ~ 20-25 ml/min.  Goal of Therapy:  INR 2-3 Anti-Xa level 0.6-1 units/ml 4hrs after LMWH dose given Monitor platelets by anticoagulation protocol: Yes   Plan:  Continue Lovenox 100 mg sq q24hrs. Coumadin 7.5 mg x 1 today. Daily PT/INR. Monitor CBC and renal function.  Maryanna Shape, PharmD, BCPS  Clinical Pharmacist  Pager: [phone]   Pager: [phone] 02/01/2015,10:58 AM

## 2015-02-01 NOTE — Progress Notes (Signed)
[name]  Travis [name] [id number] DOB: 07/31/1941 DOA: 01/29/2015 PCP: [name], MD  Assessment/Plan: 1-acute DVT with intermediate probability for PE -patient VS stable -denies current CP or significant SOB; will titrate O2 down as toelrated -given renal failure will hold on xarelto or eliquis at this moment; will continue bridging with lovenox and coumadin -continue supportive care  2. Acute on chronic renal failure (stage 3 at baseline) --Holding lasix for now --Gentle IV fluid resuscitation --no signs of UTI --Renal ultrasound, normal exam --Follow creatinine trend on BMP --will consult renal service  3. Elevated blood pressure without a history of HTN --Monitor for now --PRN hydralazine for SBP > 180 and/or DBP > 110 --was on lasix at home; not use here given acute renal failure  4. History of steroid induced hyperglycemia --Down to prednisone 5mg  daily, will continue this dose now. Outpatient rheumatology follow-up pending (scheduled for next week). --follow CBG's --A1C 4.9 --continue SSI (with main intentions to cover for CBG's > 200)  5. Acute on chronic anemia --Continue iron and B12 supplements --Protonix since he is now being anticoagulated --will follow Stool heme occult testing --Monitor for transfusion requirement/overt signs of bleeding --Hgb currently above 7.6   Code Status: Full Family Communication: wife at bedside Disposition Plan: continue inpatient; home most likely when medically stable; will need renal function to be better. Will get also PT/OT evaluation.  Consultants: Renal service   Antibiotics:  None   HPI/Subjective: Afebrile, denies CP and feeling better overall. Renal function despite good output continue rising.   Objective: Filed Vitals:   02/01/15 1613  BP: 152/81  Pulse: 101  Temp: 99.1 F (37.3 C)  Resp: 19    Intake/Output Summary (Last 24 hours) at 02/01/15 1720 Last data filed  at 02/01/15 1500  Gross per 24 hour  Intake    123 ml  Output   1225 ml  Net  -1102 ml   Filed Weights   01/29/15 1804 01/29/15 2045  Weight: 97.977 kg (216 lb) 95.119 kg (209 lb 11.2 oz)    Exam:   General:  Afebrile, feeling ok, no CP and even still weak/fatigued, reporting feeling stronger. Breathing continue improving.  Cardiovascular: S1 and S2, no rubs or gallops  Respiratory: no wheezing, no crackles, good air movement   Abdomen: soft, NT, positive BS  Musculoskeletal: no cyanosis or clubbing; 2++ edema LE bilaterally  Data Reviewed: Basic Metabolic Panel:  Recent Labs Lab 01/29/15 1438 01/30/15 0239 01/30/15 2155 01/31/15 0335 02/01/15 0357  NA 136 137  --  137 135  K 4.3 4.1  --  4.0 4.1  CL 102 104  --  106 105  CO2 24 25  --  24 22  GLUCOSE 117* 112*  --  102* 93  BUN 33* 33*  --  29* 30*  CREATININE 2.94* 3.04*  --  3.08* 3.38*  CALCIUM 8.5* 7.9*  --  8.1* 8.0*  MG  --   --  2.0  --   --    Liver Function Tests:  Recent Labs Lab 01/30/15 0239  AST 31  ALT 13*  ALKPHOS 65  BILITOT 0.9  PROT 5.4*  ALBUMIN 2.0*   CBC:  Recent Labs Lab 01/29/15 1438 01/30/15 0239 01/31/15 0335 02/01/15 0357  WBC 15.6* 13.5* 13.4* 12.4*  NEUTROABS 12.8*  --   --   --   HGB 9.4* 7.8* 7.7* 7.6*  HCT 29.7* 24.9* 25.1* 24.3*  MCV 96.1 95.0 96.2 96.8  PLT 159  136* 142* 143*   BNP (last 3 results)  Recent Labs  12/17/14 1934 01/02/15 1241 01/06/15 1758  BNP 38.6 66.6 83.3   CBG:  Recent Labs Lab 01/31/15 1111 01/31/15 1657 02/01/15 0546 02/01/15 1139 02/01/15 1611  GLUCAP 104* 124* 93 108* 119*   Studies: No results found.  Scheduled Meds: . cyanocobalamin  500 mcg Oral Daily  . docusate sodium  100 mg Oral BID  . enoxaparin (LOVENOX) injection  100 mg Subcutaneous Q24H  . ferrous sulfate  325 mg Oral BID WC  . insulin aspart  0-9 Units Subcutaneous TID WC  . pantoprazole  40 mg Oral Daily  . polyethylene glycol  17 g Oral Daily  .  predniSONE  5 mg Oral Q breakfast  . sodium chloride  3 mL Intravenous Q12H  . Warfarin - Pharmacist Dosing Inpatient   Does not apply q1800   Continuous Infusions: . sodium chloride 50 mL/hr at 02/01/15 1718    Principal Problem:   Acute DVT of right tibial vein Active Problems:   Elevated blood pressure   Steroid-induced hyperglycemia   Peripheral edema   Physical deconditioning   Sjogren's disease   Acute kidney injury   DVT (deep venous thrombosis)   Acute on chronic renal failure    Time spent: 30 minutes   Barton Dubois  [facility] Pager [phone]. If 7PM-7AM, please contact night-coverage at [email or web address], password [facility] 02/01/2015, 5:20 PM  LOS: 3 days

## 2015-02-02 LAB — RENAL FUNCTION PANEL
Albumin: 2 g/dL — ABNORMAL LOW (ref 3.5–5.0)
Anion gap: 7 (ref 5–15)
BUN: 34 mg/dL — ABNORMAL HIGH (ref 6–20)
CHLORIDE: 106 mmol/L (ref 101–111)
CO2: 23 mmol/L (ref 22–32)
Calcium: 8.2 mg/dL — ABNORMAL LOW (ref 8.9–10.3)
Creatinine, Ser: 3.97 mg/dL — ABNORMAL HIGH (ref 0.61–1.24)
GFR calc Af Amer: 16 mL/min — ABNORMAL LOW (ref >60)
GFR calc non Af Amer: 14 mL/min — ABNORMAL LOW (ref >60)
GLUCOSE: 161 mg/dL — AB (ref 65–99)
PHOSPHORUS: 5.6 mg/dL — AB (ref 2.5–4.6)
Potassium: 4.9 mmol/L (ref 3.5–5.1)
Sodium: 136 mmol/L (ref 135–145)

## 2015-02-02 LAB — GLUCOSE, CAPILLARY
GLUCOSE-CAPILLARY: 160 mg/dL — AB (ref 65–99)
Glucose-Capillary: 157 mg/dL — ABNORMAL HIGH (ref 65–99)
Glucose-Capillary: 151 mg/dL — ABNORMAL HIGH (ref 65–99)

## 2015-02-02 LAB — CBC
HCT: 27.8 % — ABNORMAL LOW (ref 39.0–52.0)
HEMOGLOBIN: 8.7 g/dL — AB (ref 13.0–17.0)
MCH: 30.2 pg (ref 26.0–34.0)
MCHC: 31.3 g/dL (ref 30.0–36.0)
MCV: 96.5 fL (ref 78.0–100.0)
PLATELETS: 140 10*3/uL — AB (ref 150–400)
RBC: 2.88 MIL/uL — AB (ref 4.22–5.81)
RDW: 22.3 % — AB (ref 11.5–15.5)
WBC: 9.7 10*3/uL (ref 4.0–10.5)

## 2015-02-02 LAB — BETA-2-GLYCOPROTEIN I ABS, IGG/M/A
Beta-2 Glyco I IgG: <9 GPI IgG units (ref 0–20)
Beta-2-Glycoprotein I IgA: <9 GPI IgA units (ref 0–25)

## 2015-02-02 LAB — PROTIME-INR
INR: 1.44 (ref 0.00–1.49)
Prothrombin Time: 17.6 seconds — ABNORMAL HIGH (ref 11.6–15.2)

## 2015-02-02 MED ORDER — ENOXAPARIN SODIUM 100 MG/ML ~~LOC~~ SOLN
100.0000 mg | SUBCUTANEOUS | Status: DC
  Filled 2015-02-02: qty 1

## 2015-02-02 MED ORDER — ENOXAPARIN SODIUM 100 MG/ML ~~LOC~~ SOLN: 100.0000 mg | SUBCUTANEOUS | Status: DC

## 2015-02-02 NOTE — Progress Notes (Signed)
PT Cancellation Note  Patient Details Name: Hervey Escareno MRN: [id number] DOB: Aug 06, 1941   Cancelled Treatment:    Reason Eval/Treat Not Completed: Medical issues which prohibited therapy.  Not yet therapeutic on meds for anticoagulation per nsg and will try tomorrow.   Ramond Dial 02/02/2015, 10:09 AM   Mee Hives, PT MS Acute Rehab Dept. Number: [facility] [id number] and [place] [phone]

## 2015-02-02 NOTE — Consult Note (Signed)
Chief Complaint: Chief Complaint  Patient presents with  . DVT  Renal failure  Referring Physician(s): Dr Jonnie Finner  History of Present Illness: Travis [name] is a 74 y.o. 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 Medical history non-contributory   . Arthritis   . Pneumonia   . GERD (gastroesophageal reflux disease)   . Sjogren's disease 01/28/2015  . Pulmonary fibrosis     Past Surgical History  Procedure Laterality Date  . Hemorroidectomy  1999  . Surgical procedure to remove a mole as a child Right Eye area    At around 76 years old    Allergies: Review of patient's allergies indicates no known allergies.  Medications: Prior to Admission medications   Medication Sig Start Date End Date Taking? Authorizing Provider  cyanocobalamin 500 MCG tablet Take 1 tablet (500 mcg total) by mouth daily. 12/26/14  Yes Donne Hazel, MD  feeding supplement, ENSURE ENLIVE, (ENSURE ENLIVE) LIQD Take 237 mLs by mouth 2 (two) times daily between meals. 01/09/15  Yes Geradine Girt, DO  ferrous sulfate 325 (65 FE) MG tablet Take 1 tablet (325 mg total) by mouth 2 (two) times daily with a meal. 12/26/14  Yes Donne Hazel, MD  furosemide (LASIX) 40 MG tablet Take 1 tablet (40 mg total) by mouth daily. 01/28/15  Yes Brand Males, MD  loratadine (CLARITIN) 10 MG tablet Take 10 mg by mouth daily.   Yes Historical Provider, MD  nystatin (MYCOSTATIN) 100000 UNIT/ML suspension Take 5 mLs (500,000 Units total) by mouth 4 (four) times daily. For 7 days 01/09/15  Yes Geradine Girt, DO  potassium chloride SA (KLOR-CON M20) 20 MEQ tablet Take 1 tablet (20 mEq  total) by mouth daily. 01/28/15  Yes Brand Males, MD  predniSONE (DELTASONE) 10 MG tablet Take 1 tablet (10 mg total) by mouth daily with breakfast. Patient taking differently: Take 5 mg by mouth daily with breakfast.  01/09/15  Yes Geradine Girt, DO  traMADol-acetaminophen (ULTRACET) 37.5-325 MG per tablet Take 1-2 tablets by mouth every 4 (four) hours as needed for severe pain. 12/26/14  Yes Donne Hazel, MD  enoxaparin (LOVENOX) 100 MG/ML injection Inject 1 mL (100 mg total) into the skin daily. 01/31/15   Barton Dubois, MD  pantoprazole (PROTONIX) 40 MG tablet Take 1 tablet (40 mg total) by mouth daily. 01/31/15   Barton Dubois, MD     Family History  Problem Relation Age of Onset  . Hypertension Mother     History   Social History  . Marital Status: Married    Spouse Name: N/A  . Number of Children: N/A  . Years of Education: N/A   Social History Main Topics  . Smoking status: Never Smoker   . Smokeless tobacco: Never Used  . Alcohol Use: No  . Drug Use: No  . Sexual Activity: Not on file   Other Topics Concern  . None   Social History Narrative     Review of Systems: A 12 point ROS discussed and pertinent positives are indicated in the HPI above.  All other systems are negative.  Review of Systems  Constitutional: Positive  for activity change, appetite change and fatigue. Negative for fever.  Respiratory: Negative for cough and shortness of breath.   Cardiovascular: Negative for chest pain.  Gastrointestinal: Negative for abdominal pain and blood in stool.  Musculoskeletal: Positive for myalgias. Negative for back pain.  Neurological: Positive for weakness.  Psychiatric/Behavioral: Negative for behavioral problems and confusion.    Vital Signs: BP 161/86 mmHg  Pulse 94  Temp(Src) 98 F (36.7 C) (Oral)  Resp 18  Ht 5\' 10"  (1.778 m)  Wt 96.2 kg (212 lb 1.3 oz)  BMI 30.43 kg/m2  SpO2 100%  Physical Exam  Constitutional: He is oriented to person, place,  and time. He appears well-nourished.  Cardiovascular: Normal rate and regular rhythm.   No murmur heard. Pulmonary/Chest: Effort normal and breath sounds normal. He has no wheezes.  Abdominal: Soft. Bowel sounds are normal. There is no tenderness.  Musculoskeletal: Normal range of motion.  Neurological: He is alert and oriented to person, place, and time.  Skin: Skin is warm and dry.  Psychiatric: He has a normal mood and affect. His behavior is normal. Judgment and thought content normal.  Nursing note and vitals reviewed.   Mallampati Score:  MD Evaluation Airway: WNL Heart: WNL Abdomen: WNL Chest/ Lungs: WNL ASA  Classification: 3 Mallampati/Airway Score: Two  Imaging: Dg Chest 2 View  01/29/2015   CLINICAL DATA:  Pneumonia, shortness of breath.  EXAM: CHEST  2 VIEW  COMPARISON:  Jan 28, 2015.  FINDINGS: Stable cardiomediastinal silhouette. No pneumothorax is noted. Stable bibasilar opacities are noted consistent with pneumonia with probable minimal associated pleural effusions. Bony thorax appears intact.  IMPRESSION: Stable bibasilar opacities are noted most consistent with pneumonia. Continued radiographic follow-up is recommended to ensure resolution.   Electronically Signed   By: Marijo Conception, M.D.   On: 01/29/2015 17:51   Dg Chest 2 View  01/28/2015   CLINICAL DATA:  Pneumonia, followup  EXAM: CHEST  2 VIEW  COMPARISON:  Chest x-ray of 01/06/2015  FINDINGS: There has been worsening of parenchymal opacities in both lower lobes consistent with worsening of bilateral lower lobe pneumonia. A small right pleural effusion cannot be excluded. Mediastinal and hilar contours are unchanged. The heart is mildly enlarged and stable. No acute bony abnormality is seen.  IMPRESSION: Worsening of lower lobe opacities consistent with pneumonia.   Electronically Signed   By: Ivar Drape M.D.   On: 01/28/2015 14:47   Dg Chest 2 View  01/06/2015   CLINICAL DATA:  Shortness of breath and cough;  recent pneumonia  EXAM: CHEST  2 VIEW  COMPARISON:  Chest radiograph January 02, 2015; chest CT January 02, 2015  FINDINGS: There is persistent bibasilar airspace disease. Lungs elsewhere clear. Heart is upper normal in size with pulmonary vascularity within normal limits. No adenopathy. There is upper thoracic levoscoliosis.  IMPRESSION: Bibasilar airspace disease consistent with pneumonia, not significantly changed compared to recent prior studies. No new opacity apparent.   Electronically Signed   By: Lowella Grip III M.D.   On: 01/06/2015 16:53   US Renal  01/30/2015   CLINICAL DATA:  Acute renal failure.  Sjogren's disease.  EXAM: RENAL / URINARY TRACT ULTRASOUND COMPLETE  COMPARISON:  None.  FINDINGS: Right Kidney:  Length: 12.5 cm. Echogenicity within normal limits. No mass or hydronephrosis visualized.  Left Kidney:  Length: 13.7 cm. Echogenicity within normal limits. No mass or hydronephrosis visualized.  Bladder:  Bladder is nondistended. Mild bladder wall prominence is most likely secondary  to nondistended state.  IMPRESSION: Normal exam.   Electronically Signed   By: Marcello Moores  Register   On: 01/30/2015 07:40   Nm Pulmonary Perf And Vent  01/29/2015   CLINICAL DATA:  Shortness of breath for 2 weeks. Known deep venous thrombosis .  EXAM: NUCLEAR MEDICINE VENTILATION - PERFUSION LUNG SCAN  TECHNIQUE: Ventilation images were obtained in multiple projections using inhaled aerosol Tc-42m DTPA. Perfusion images were obtained in multiple projections after intravenous injection of Tc-7m MAA.  RADIOPHARMACEUTICALS:  40.0 Technetium-27m DTPA aerosol inhalation and 6.0 Technetium-10m MAA IV  COMPARISON:  Chest radiograph same date, which demonstrate left greater than right bibasilar airspace disease.  FINDINGS: Ventilation: Vague decreased ventilation involving the left greater than right lung bases, corresponding to airspace disease on chest radiograph.  Perfusion: Vague decreased perfusion within the same  areas of the lung bases, greater on the left.  IMPRESSION: Triple matched defects within the lung bases, greater on the left than right. Intermediate probability for pulmonary embolism.   Electronically Signed   By: Abigail Miyamoto M.D.   On: 01/29/2015 17:37    Labs:  CBC:  Recent Labs  01/30/15 0239 01/31/15 0335 02/01/15 0357 02/02/15 0600  WBC 13.5* 13.4* 12.4* 9.7  HGB 7.8* 7.7* 7.6* 8.7*  HCT 24.9* 25.1* 24.3* 27.8*  PLT 136* 142* 143* 140*    COAGS:  Recent Labs  01/29/15 1438 02/01/15 0357 02/02/15 0600  INR 1.19 1.29 1.44    BMP:  Recent Labs  01/29/15 1438 01/30/15 0239 01/31/15 0335 02/01/15 0357  NA 136 137 137 135  K 4.3 4.1 4.0 4.1  CL 102 104 106 105  CO2 24 25 24 22   GLUCOSE 117* 112* 102* 93  BUN 33* 33* 29* 30*  CALCIUM 8.5* 7.9* 8.1* 8.0*  CREATININE 2.94* 3.04* 3.08* 3.38*  GFRNONAA 20* 19* 19* 17*  GFRAA 23* 22* 22* 19*    LIVER FUNCTION TESTS:  Recent Labs  12/19/14 0531 12/26/14 0522 01/06/15 1609 01/30/15 0239  BILITOT 0.5 0.4 0.7 0.9  AST 24 49* 46* 31  ALT 10 33 37 13*  ALKPHOS 60 96 136* 65  PROT 6.9 6.5 6.7 5.4*  ALBUMIN 2.5* 2.2* 2.2* 2.0*    TUMOR MARKERS: No results for input(s): AFPTM, CEA, CA199, CHROMGRNA in the last 8760 hours.  Assessment and Plan:  Acute on chronic renal failure Worsening renal fxn New Rt LE DVT---on coumadin and Lovenox - HOLD til after bx Now scheduled for random renal bx Risks and Benefits discussed with the patient including, but not limited to bleeding, infection, damage to adjacent structures or low yield requiring additional tests. All of the patient's questions were answered, patient is agreeable to proceed. Consent signed and in chart  Thank you for this interesting consult.  I greatly enjoyed meeting Lelton Freedman and look forward to participating in their care.  Signed: Jena Tegeler A 02/02/2015, 9:53 AM   I spent a total of 40 Minutes    in face to face in clinical  consultation, greater than 50% of which was counseling/coordinating care for random renal bx

## 2015-02-02 NOTE — Progress Notes (Signed)
[name]  Travis [name] [id number] DOB: [birth year] DOA: 01/29/2015 PCP: [name], MD  Assessment/Plan: 1-acute DVT with intermediate probability for PE -patient VS stable -denies current CP or significant SOB -given renal failure will plan for coumadin as anticoagulation of choice; will need bridge with lovenox. On hold until after renal biopsy. -continue supportive care  2. Acute on chronic renal failure (stage 3 at baseline) --Holding lasix for now --Gentle IV fluid resuscitation provided w/o change in renal function, now building up fluids. IVF discontinued --no signs of UTI --Renal ultrasound, normal exam --Follow creatinine trend on BMP --will follow renal service rec's; plan is for high dose steroids and renal biopsy   3. Elevated blood pressure without a history of HTN --Monitor for now --PRN hydralazine for SBP > 180 and/or DBP > 110 --was on lasix at home; not use here given acute renal failure  4. History of steroid induced hyperglycemia --per renal and given concerns of TIN/FSGS will use solumedrol high dose X3 days  --Outpatient rheumatology follow-up pending (was scheduled for 5/25). --follow CBG's --A1C 4.9 --continue SSI   5. Acute on chronic anemia --Continue iron and B12 supplements --Protonix since he is now being anticoagulated --will follow Stool heme occult testing --Monitor for transfusion requirement/overt signs of bleeding --Hgb currently 8.7   Code Status: Full Family Communication: wife at bedside Disposition Plan: continue inpatient; home most likely when medically stable; will need renal function to be better. Will get also PT/OT evaluation.  Consultants: Renal service   Antibiotics:  None   HPI/Subjective: Afebrile, denies CP and reports no N/V and good appetite. Cr up to 3.97; patient is slight SOB and with increase swelling.  Objective: Filed Vitals:   02/02/15 1306  BP: 159/93  Pulse: 91   Temp: 97.7 F (36.5 C)  Resp: 19    Intake/Output Summary (Last 24 hours) at 02/02/15 1545 Last data filed at 02/02/15 1358  Gross per 24 hour  Intake    704 ml  Output    600 ml  Net    104 ml   Filed Weights   01/29/15 1804 01/29/15 2045 02/02/15 0353  Weight: 97.977 kg (216 lb) 95.119 kg (209 lb 11.2 oz) 96.2 kg (212 lb 1.3 oz)    Exam:   General:  Afebrile, slightly SOB, no CP. Patient with increase LE swelling  Cardiovascular: S1 and S2, no rubs or gallops  Respiratory: no wheezing, no crackles, decrease air movement at bases  Abdomen: soft, NT, positive BS  Musculoskeletal: no cyanosis or clubbing; 2++ edema LE bilaterally  Data Reviewed: Basic Metabolic Panel:  Recent Labs Lab 01/29/15 1438 01/30/15 0239 01/30/15 2155 01/31/15 0335 02/01/15 0357 02/02/15 1150  NA 136 137  --  137 135 136  K 4.3 4.1  --  4.0 4.1 4.9  CL 102 104  --  106 105 106  CO2 24 25  --  24 22 23   GLUCOSE 117* 112*  --  102* 93 161*  BUN 33* 33*  --  29* 30* 34*  CREATININE 2.94* 3.04*  --  3.08* 3.38* 3.97*  CALCIUM 8.5* 7.9*  --  8.1* 8.0* 8.2*  MG  --   --  2.0  --   --   --   PHOS  --   --   --   --   --  5.6*   Liver Function Tests:  Recent Labs Lab 01/30/15 0239 02/02/15 1150  AST 31  --   ALT 13*  --  ALKPHOS 65  --   BILITOT 0.9  --   PROT 5.4*  --   ALBUMIN 2.0* 2.0*   CBC:  Recent Labs Lab 01/29/15 1438 01/30/15 0239 01/31/15 0335 02/01/15 0357 02/02/15 0600  WBC 15.6* 13.5* 13.4* 12.4* 9.7  NEUTROABS 12.8*  --   --   --   --   HGB 9.4* 7.8* 7.7* 7.6* 8.7*  HCT 29.7* 24.9* 25.1* 24.3* 27.8*  MCV 96.1 95.0 96.2 96.8 96.5  PLT 159 136* 142* 143* 140*   BNP (last 3 results)  Recent Labs  12/17/14 1934 01/02/15 1241 01/06/15 1758  BNP 38.6 66.6 83.3   CBG:  Recent Labs Lab 02/01/15 1139 02/01/15 1611 02/01/15 2050 02/02/15 0547 02/02/15 1146  GLUCAP 108* 119* 108* 160* 157*   Studies: No results found.  Scheduled Meds: .  cyanocobalamin  500 mcg Oral Daily  . docusate sodium  100 mg Oral BID  . [START ON 02/04/2015] enoxaparin (LOVENOX) injection  100 mg Subcutaneous Q24H  . ferrous sulfate  325 mg Oral BID WC  . insulin aspart  0-9 Units Subcutaneous TID WC  . methylPREDNISolone (SOLU-MEDROL) injection  250 mg Intravenous 3 times per day  . pantoprazole  40 mg Oral Daily  . polyethylene glycol  17 g Oral Daily  . sodium chloride  3 mL Intravenous Q12H  . Warfarin - Pharmacist Dosing Inpatient   Does not apply q1800   Continuous Infusions: . sodium chloride Stopped (02/01/15 1719)    Principal Problem:   Acute DVT of right tibial vein Active Problems:   Elevated blood pressure   Steroid-induced hyperglycemia   Peripheral edema   Physical deconditioning   Sjogren's disease   Acute kidney injury   DVT (deep venous thrombosis)   Acute on chronic renal failure    Time spent: 30 minutes   Barton Dubois  [facility] Pager [phone]. If 7PM-7AM, please contact night-coverage at [email or web address], password [facility] 02/02/2015, 3:45 PM  LOS: 4 days

## 2015-02-02 NOTE — Progress Notes (Addendum)
[facility] Progress Note   Subjective: no new problems, UOP 1000 cc yesterday  Filed Vitals:   02/01/15 0336 02/01/15 1613 02/01/15 2017 02/02/15 0353  BP: 157/79 152/81 146/86 161/86  Pulse: 98 101 102 94  Temp: 98.2 F (36.8 C) 99.1 F (37.3 C) 99.6 F (37.6 C) 98 F (36.7 C)  TempSrc: Oral Oral Oral Oral  Resp: 18 19 18 18   Height:      Weight:    96.2 kg (212 lb 1.3 oz)  SpO2: 95% 98% 94% 100%   Exam: Alert, weak, fatigued JVP 8-10 cm Chest bilat fine rales 1/2 up RRR no rub, M or gallop, precordium quiet Abd soft, ntnd no ascites, liver down 5 cm GU nl male 2+pitting pretib edema bilat No LE lesions or wounds Neuro is alert, Ox 15 December 2014 >  SS-A ++, SS-B slightly +, RF +28 (0-13.9) Urine IFE and SPEP no monoclonal spike, serum free LC ratio 1.74 (negative) Anti Smith, anti-RNA, anti JoAb's negative C-ANCA, P-ANCA, atypical ANCA negative  CXR bibasilar disease, worsening since April '16 CT chest 4/7 -- bibasilar interstitial fibrosis with some RLL consolidation CT chset 4/21 -- worsening bibasilar disease compared to 4/7 w similar patterning UA 5/18 7-10 rbc , 100 prot, 1.013, 0-2 wbc, large Hb UA 5/21 (undersigned) diffuse granular casts, occ RBC in casts, one WBC cast, 15- 25 rbc/hpf, 1-3 wbc/hpf Renal US 12-14 cm kidneys, no hydro  Assessment: 1. Acute renal failure in patient with recently diagnosed autoimmune disease (pulm fibrosis, hypoxemia, LE edema and RF/SSA/ SSB+) felt to be probably Sjogren's, now with significant renal failure. Patient has active sediment for GN. GN's assoc w Sjogrens include cryoglobulinemic, hypocomplementemic (lupus GN) or FSGS. Can also cause acute and/or chronic TIN. Have started high-dose IV solumedrol now x 3 days, IR consulted for kidney biopsy. Daily labs. Will follow 2. Volume excess/ LE edema - holding fluids, consider lasix cautiously if/ when creat stabilizes.  Plan - as above    Kelly Splinter  MD  pager [phone]    cell [phone]  02/02/2015, 12:19 PM     Recent Labs Lab 01/30/15 0239 01/31/15 0335 02/01/15 0357  NA 137 137 135  K 4.1 4.0 4.1  CL 104 106 105  CO2 25 24 22   GLUCOSE 112* 102* 93  BUN 33* 29* 30*  CREATININE 3.04* 3.08* 3.38*  CALCIUM 7.9* 8.1* 8.0*    Recent Labs Lab 01/30/15 0239  AST 31  ALT 13*  ALKPHOS 65  BILITOT 0.9  PROT 5.4*  ALBUMIN 2.0*    Recent Labs Lab 01/29/15 1438  01/31/15 0335 02/01/15 0357 02/02/15 0600  WBC 15.6*  < > 13.4* 12.4* 9.7  NEUTROABS 12.8*  --   --   --   --   HGB 9.4*  < > 7.7* 7.6* 8.7*  HCT 29.7*  < > 25.1* 24.3* 27.8*  MCV 96.1  < > 96.2 96.8 96.5  PLT 159  < > 142* 143* 140*  < > = values in this interval not displayed. . cyanocobalamin  500 mcg Oral Daily  . docusate sodium  100 mg Oral BID  . enoxaparin (LOVENOX) injection  100 mg Subcutaneous Q24H  . ferrous sulfate  325 mg Oral BID WC  . insulin aspart  0-9 Units Subcutaneous TID WC  . methylPREDNISolone (SOLU-MEDROL) injection  250 mg Intravenous 3 times per day  . pantoprazole  40 mg Oral Daily  . polyethylene glycol  17 g Oral  Daily  . sodium chloride  3 mL Intravenous Q12H  . Warfarin - Pharmacist Dosing Inpatient   Does not apply q1800   . sodium chloride 50 mL/hr at 02/01/15 1718   acetaminophen **OR** acetaminophen, alum & mag hydroxide-simeth, hydrALAZINE

## 2015-02-02 NOTE — Progress Notes (Signed)
ANTICOAGULATION CONSULT NOTE - Follow Up Consult  Pharmacy Consult for Lovenox and Coumadin Indication: pulmonary embolus and DVT  No Known Allergies  Patient Measurements: Height: 5\' 10"  (177.8 cm) Weight: 212 lb 1.3 oz (96.2 kg) IBW/kg (Calculated) : 73  Vital Signs: Temp: 97.7 F (36.5 C) (05/22 1306) Temp Source: Oral (05/22 1306) BP: 159/93 mmHg (05/22 1306) Pulse Rate: 91 (05/22 1306)  Labs:  Recent Labs  01/30/15 1957  01/31/15 0335 02/01/15 0357 02/02/15 0600 02/02/15 1150  HGB  --   < > 7.7* 7.6* 8.7*  --   HCT  --   --  25.1* 24.3* 27.8*  --   PLT  --   --  142* 143* 140*  --   LABPROT  --   --   --  16.3* 17.6*  --   INR  --   --   --  1.29 1.44  --   HEPARINUNFRC 0.41  --  0.35  --   --   --   CREATININE  --   --  3.08* 3.38*  --  3.97*  < > = values in this interval not displayed.  Estimated Creatinine Clearance: 19.3 mL/min (by C-G formula based on Cr of 3.97).  Assessment: 29 YOM with new right LE DVT and intermediate probability of PE per VQ scan. On lovenox/coumadin overlap day # 3/5. INR 1.44, trending up slowly. CBC low stable. Albumin only 2.0, so may be sensitive to Coumadin doses. Scr continue trending up 3.97 today, with est. crcl ~ 20 ml/min. Plan for kidney biopsy tomorrow. Will hold coumadin and lovenox today and tomorrow.  Goal of Therapy:  INR 2-3 Anti-Xa level 0.6-1 units/ml 4hrs after LMWH dose given Monitor platelets by anticoagulation protocol: Yes   Plan:  No Coumadin tonight Reschedule Lovenox to restart on 5/24 Daily PT/INR. Monitor CBC and renal function. F/u after biopsy  Maryanna Shape, PharmD, BCPS  Clinical Pharmacist  Pager: [phone]  02/02/2015,1:14 PM

## 2015-02-03 LAB — RENAL FUNCTION PANEL
Albumin: 2.2 g/dL — ABNORMAL LOW (ref 3.5–5.0)
Anion gap: 7 (ref 5–15)
BUN: 41 mg/dL — ABNORMAL HIGH (ref 6–20)
CO2: 24 mmol/L (ref 22–32)
CREATININE: 4.12 mg/dL — AB (ref 0.61–1.24)
Calcium: 8 mg/dL — ABNORMAL LOW (ref 8.9–10.3)
Chloride: 106 mmol/L (ref 101–111)
GFR, EST AFRICAN AMERICAN: 15 mL/min — AB (ref >60)
GFR, EST NON AFRICAN AMERICAN: 13 mL/min — AB (ref >60)
Glucose, Bld: 147 mg/dL — ABNORMAL HIGH (ref 65–99)
PHOSPHORUS: 5.6 mg/dL — AB (ref 2.5–4.6)
Potassium: 4.2 mmol/L (ref 3.5–5.1)
Sodium: 137 mmol/L (ref 135–145)

## 2015-02-03 LAB — CBC
HCT: 25.2 % — ABNORMAL LOW (ref 39.0–52.0)
HEMOGLOBIN: 7.8 g/dL — AB (ref 13.0–17.0)
MCH: 29.7 pg (ref 26.0–34.0)
MCHC: 31 g/dL (ref 30.0–36.0)
MCV: 95.8 fL (ref 78.0–100.0)
PLATELETS: 159 10*3/uL (ref 150–400)
RBC: 2.63 MIL/uL — ABNORMAL LOW (ref 4.22–5.81)
RDW: 22.7 % — ABNORMAL HIGH (ref 11.5–15.5)
WBC: 14.4 10*3/uL — ABNORMAL HIGH (ref 4.0–10.5)

## 2015-02-03 LAB — FACTOR 5 LEIDEN

## 2015-02-03 LAB — GLUCOSE, CAPILLARY
GLUCOSE-CAPILLARY: 151 mg/dL — AB (ref 65–99)
GLUCOSE-CAPILLARY: 139 mg/dL — AB (ref 65–99)
GLUCOSE-CAPILLARY: 120 mg/dL — AB (ref 65–99)
Glucose-Capillary: 158 mg/dL — ABNORMAL HIGH (ref 65–99)
Glucose-Capillary: 142 mg/dL — ABNORMAL HIGH (ref 65–99)

## 2015-02-03 LAB — PROTIME-INR
INR: 1.82 — AB (ref 0.00–1.49)
Prothrombin Time: 21 seconds — ABNORMAL HIGH (ref 11.6–15.2)

## 2015-02-03 LAB — HEPARIN LEVEL (UNFRACTIONATED): HEPARIN UNFRACTIONATED: 0.25 [IU]/mL — AB (ref 0.30–0.70)

## 2015-02-03 LAB — GLOMERULAR BASEMENT MEMBRANE ANTIBODIES: GBM AB: 7 U (ref 0–20)

## 2015-02-03 MED ORDER — HEPARIN BOLUS VIA INFUSION
2000.0000 [IU] | Freq: Once | INTRAVENOUS | Status: AC
  Administered 2015-02-03: 2000 [IU] via INTRAVENOUS
  Filled 2015-02-03: qty 2000

## 2015-02-03 MED ORDER — ENOXAPARIN SODIUM 100 MG/ML ~~LOC~~ SOLN: 100.0000 mg | SUBCUTANEOUS | Status: DC

## 2015-02-03 MED ORDER — HEPARIN (PORCINE) IN NACL 100-0.45 UNIT/ML-% IJ SOLN
1650.0000 [IU]/h | INTRAMUSCULAR | Status: DC
  Administered 2015-02-04: 1650 [IU]/h via INTRAVENOUS
  Filled 2015-02-03 (×4): qty 250

## 2015-02-03 MED ORDER — HEPARIN (PORCINE) IN NACL 100-0.45 UNIT/ML-% IJ SOLN
1450.0000 [IU]/h | INTRAMUSCULAR | Status: DC
  Administered 2015-02-03: 1450 [IU]/h via INTRAVENOUS
  Filled 2015-02-03 (×2): qty 250

## 2015-02-03 NOTE — Progress Notes (Signed)
PT Cancellation Note  Patient Details Name: Travis [name] MRN: [id number] DOB: [birth year]   Cancelled Treatment:    Reason Eval/Treat Not Completed: Medical issues which prohibited therapy. Pt with LE DVT/?DVT and not therapeutic on anti-coagulants as awaiting renal biopsy.    Travis [name] 02/03/2015, 8:44 AM Pager [phone]

## 2015-02-03 NOTE — Progress Notes (Addendum)
ANTICOAGULATION CONSULT NOTE - Follow Up Consult  Pharmacy Consult for heparin Indication: PE/DVT  No Known Allergies  Patient Measurements: Height: 5\' 10"  (177.8 cm) Weight: 213 lb 10 oz (96.9 kg) IBW/kg (Calculated) : 73 Heparin Dosing Weight: 93kg  Vital Signs: Temp: 98 F (36.7 C) (05/23 0606) Temp Source: Oral (05/23 0606) BP: 136/78 mmHg (05/23 1000) Pulse Rate: 85 (05/23 1000)  Labs:  Recent Labs  02/01/15 0357 02/02/15 0600 02/02/15 1150 02/03/15 0430  HGB 7.6* 8.7*  --  7.8*  HCT 24.3* 27.8*  --  25.2*  PLT 143* 140*  --  159  LABPROT 16.3* 17.6*  --  21.0*  INR 1.29 1.44  --  1.82*  CREATININE 3.38*  --  3.97* 4.12*    Estimated Creatinine Clearance: 18.7 mL/min (by C-G formula based on Cr of 4.12).   Medications:  Scheduled:  . cyanocobalamin  500 mcg Oral Daily  . docusate sodium  100 mg Oral BID  . [START ON 02/05/2015] enoxaparin (LOVENOX) injection  100 mg Subcutaneous Q24H  . ferrous sulfate  325 mg Oral BID WC  . insulin aspart  0-9 Units Subcutaneous TID WC  . methylPREDNISolone (SOLU-MEDROL) injection  250 mg Intravenous 3 times per day  . pantoprazole  40 mg Oral Daily  . polyethylene glycol  17 g Oral Daily  . sodium chloride  3 mL Intravenous Q12H  . Warfarin - Pharmacist Dosing Inpatient   Does not apply q1800    Assessment: 74 yo male with DVT and VQ with intermediate probability for PE. He was on lovenox/coumadin but this in now on hold with plans for biopsy which has been delayed until 5/24. Last lovenox dose was 5/21 and last coumadin dose  was 5/21. INR is 1.8. Hg is 7.7 but has been low (range 7.7- 8.7 in the last few days), plt= 159  -Spoke with Monia Sabal PA and also with Dr. Dyann Kief: Will plan on bridge with heparin until biopsy is completed. After biopsy plan is for lovenox beginning the day after the biopsy is completed.   Goal of Therapy:  Heparin level 0.3-0.7 units/ml Monitor platelets by anticoagulation protocol:  Yes   Plan:  -Begin heparin with 2000 units bolus then 1450 units/hr (~ 16 units/kg/hr) -Heparin level in 8 hours and daily wth CBC daily  Hildred Laser, Pharm D 02/03/2015 12:42 PM     Addendum: Initial heparin level is below goal at 0.25. Increase heparin to 1650 units/hr and follow up AM labs. Plan for biopsy tomorrow.  Nena Jordan, PharmD, BCPS 02/03/2015, 9:36 PM

## 2015-02-03 NOTE — Progress Notes (Signed)
Patient ID: Travis [name], male   DOB: 1941/04/26, 74 y.o.   MRN: [id number]   Was scheduled for random renal biopsy today in Radiology INR 1.82 this am Too high to safely perform procedure  LD coumadin 5/21 per chart Will hold coumadin again today Hold Lovenox again 5/24  Plan for bx 5/24 am  RN aware

## 2015-02-03 NOTE — Progress Notes (Signed)
OT Cancellation Note  Patient Details Name: Travis [name] MRN: [id number] DOB: 21-Apr-1941   Cancelled Treatment:    Reason Eval/Treat Not Completed: Medical issues which prohibited therapy. Pt with LE DVT/?DVT and not therapeutic on anti-coagulants as awaiting renal biopsy.   Benito Mccreedy OTR/L [id number] 02/03/2015, 12:30 PM

## 2015-02-03 NOTE — Progress Notes (Signed)
[name]  Travis [name] [id number] DOB: [birth year] DOA: 01/29/2015 PCP: [name], MD  Assessment/Plan: 1-acute DVT with intermediate probability for PE -patient VS stable -denies current CP or significant SOB -given renal failure will plan for coumadin as anticoagulation of choice; will need bridge with lovenox. On hold until after renal biopsy. -continue supportive care  2. Acute on chronic renal failure (stage 3 at baseline) --Holding lasix for now --Gentle IV fluid resuscitation provided w/o change in renal function, now building up fluids. IVF discontinued --no signs of UTI --Renal ultrasound, normal exam --Follow creatinine trend on BMP --will follow renal service rec's; plan is for high dose steroids and renal biopsy (hopefully will be able to be done on 5/24; held due to INR of 1.8)  3. Elevated blood pressure without a history of HTN --Monitor for now --PRN hydralazine for SBP > 180 and/or DBP > 110 --was on lasix at home; not use here given acute renal failure and continue worsening  4. History of steroid induced hyperglycemia --per renal and given concerns of TIN/FSGS will use solumedrol high dose X3 days  --Outpatient rheumatology follow-up pending (was scheduled for 5/25; but will required to be re-schedule given acute hosipitalization). --follow CBG's --A1C 4.9 --continue SSI   5. Acute on chronic anemia --Continue iron and B12 supplements --Protonix since he is now being anticoagulated --will follow Stool heme occult testing --Monitor for transfusion requirement/overt signs of bleeding --Hgb currently 7.8   Code Status: Full Family Communication: wife at bedside Disposition Plan: continue inpatient; home most likely when medically stable; will need renal function to be better. Will get also PT/OT evaluation.  Consultants: Renal service   Antibiotics:  None   HPI/Subjective: Afebrile, denies CP and reports no N/V  and good appetite. Cr up to 4.12. Patient swelling on his LE bilaterally remains stable since 5/22  Objective: Filed Vitals:   02/03/15 1000  BP: 136/78  Pulse: 85  Temp:   Resp:     Intake/Output Summary (Last 24 hours) at 02/03/15 1502 Last data filed at 02/03/15 [id number]  Gross per 24 hour  Intake      0 ml  Output    500 ml  Net   -500 ml   Filed Weights   01/29/15 2045 02/02/15 0353 02/03/15 0606  Weight: 95.119 kg (209 lb 11.2 oz) 96.2 kg (212 lb 1.3 oz) 96.9 kg (213 lb 10 oz)    Exam:   General:  Afebrile, no CP. Patient with stable LE swelling from 5/22. Worsening Cr   Cardiovascular: S1 and S2, no rubs or gallops  Respiratory: no wheezing, no crackles, decrease air movement at bases  Abdomen: soft, NT, positive BS  Musculoskeletal: no cyanosis or clubbing; 2++ edema LE bilaterally  Data Reviewed: Basic Metabolic Panel:  Recent Labs Lab 01/30/15 0239 01/30/15 2155 01/31/15 0335 02/01/15 0357 02/02/15 1150 02/03/15 0430  NA 137  --  137 135 136 137  K 4.1  --  4.0 4.1 4.9 4.2  CL 104  --  106 105 106 106  CO2 25  --  24 22 23 24   GLUCOSE 112*  --  102* 93 161* 147*  BUN 33*  --  29* 30* 34* 41*  CREATININE 3.04*  --  3.08* 3.38* 3.97* 4.12*  CALCIUM 7.9*  --  8.1* 8.0* 8.2* 8.0*  MG  --  2.0  --   --   --   --   PHOS  --   --   --   --  5.6* 5.6*   Liver Function Tests:  Recent Labs Lab 01/30/15 0239 02/02/15 1150 02/03/15 0430  AST 31  --   --   ALT 13*  --   --   ALKPHOS 65  --   --   BILITOT 0.9  --   --   PROT 5.4*  --   --   ALBUMIN 2.0* 2.0* 2.2*   CBC:  Recent Labs Lab 01/29/15 1438 01/30/15 0239 01/31/15 0335 02/01/15 0357 02/02/15 0600 02/03/15 0430  WBC 15.6* 13.5* 13.4* 12.4* 9.7 14.4*  NEUTROABS 12.8*  --   --   --   --   --   HGB 9.4* 7.8* 7.7* 7.6* 8.7* 7.8*  HCT 29.7* 24.9* 25.1* 24.3* 27.8* 25.2*  MCV 96.1 95.0 96.2 96.8 96.5 95.8  PLT 159 136* 142* 143* 140* 159   BNP (last 3 results)  Recent Labs   12/17/14 1934 01/02/15 1241 01/06/15 1758  BNP 38.6 66.6 83.3   CBG:  Recent Labs Lab 02/02/15 1146 02/02/15 1613 02/02/15 2128 02/03/15 0611 02/03/15 1152  GLUCAP 157* 151* 158* 151* 142*   Studies: No results found.  Scheduled Meds: . cyanocobalamin  500 mcg Oral Daily  . docusate sodium  100 mg Oral BID  . ferrous sulfate  325 mg Oral BID WC  . insulin aspart  0-9 Units Subcutaneous TID WC  . methylPREDNISolone (SOLU-MEDROL) injection  250 mg Intravenous 3 times per day  . pantoprazole  40 mg Oral Daily  . polyethylene glycol  17 g Oral Daily  . sodium chloride  3 mL Intravenous Q12H  . Warfarin - Pharmacist Dosing Inpatient   Does not apply q1800   Continuous Infusions: . sodium chloride Stopped (02/01/15 1719)  . heparin 1,450 Units/hr (02/03/15 1341)    Principal Problem:   Acute DVT of right tibial vein Active Problems:   Elevated blood pressure   Steroid-induced hyperglycemia   Peripheral edema   Physical deconditioning   Sjogren's disease   Acute kidney injury   DVT (deep venous thrombosis)   Acute on chronic renal failure   Time spent: 30 minutes   Barton Dubois  [facility] Pager [phone]. If 7PM-7AM, please contact night-coverage at [email or web address], password [facility] 02/03/2015, 3:02 PM  LOS: 5 days

## 2015-02-03 NOTE — Progress Notes (Signed)
Medicare Important Message given? YES  (If response is "NO", the following Medicare IM given date fields will be blank)  Date Medicare IM given: 02/03/15 Medicare IM given by:  Endy Easterly  

## 2015-02-03 NOTE — Progress Notes (Signed)
[facility] ROUNDING NOTE   Subjective:   Interval History: biopsy of kidney planned today     Objective:  Vital signs in last 24 hours:  Temp:  [97.7 F (36.5 C)-98 F (36.7 C)] 98 F (36.7 C) (05/23 0606) Pulse Rate:  [85-92] 85 (05/23 1000) Resp:  [18-19] 18 (05/23 0606) BP: (134-159)/(74-93) 136/78 mmHg (05/23 1000) SpO2:  [98 %-100 %] 99 % (05/23 0606) Weight:  [96.9 kg (213 lb 10 oz)] 96.9 kg (213 lb 10 oz) (05/23 0606)  Weight change: 0.7 kg (1 lb 8.7 oz) Filed Weights   01/29/15 2045 02/02/15 0353 02/03/15 0606  Weight: 95.119 kg (209 lb 11.2 oz) 96.2 kg (212 lb 1.3 oz) 96.9 kg (213 lb 10 oz)    Intake/Output: I/O last 3 completed shifts: In: 464 [P.O.:360; IV Piggyback:104] Out: 950 [Urine:950]   Intake/Output this shift:     CVS- RRR RS- CTA ABD- BS present soft non-distended EXT- no edema   Basic Metabolic Panel:  Recent Labs Lab 01/30/15 0239 01/30/15 2155 01/31/15 0335 02/01/15 0357 02/02/15 1150 02/03/15 0430  NA 137  --  137 135 136 137  K 4.1  --  4.0 4.1 4.9 4.2  CL 104  --  106 105 106 106  CO2 25  --  24 22 23 24   GLUCOSE 112*  --  102* 93 161* 147*  BUN 33*  --  29* 30* 34* 41*  CREATININE 3.04*  --  3.08* 3.38* 3.97* 4.12*  CALCIUM 7.9*  --  8.1* 8.0* 8.2* 8.0*  MG  --  2.0  --   --   --   --   PHOS  --   --   --   --  5.6* 5.6*    Liver Function Tests:  Recent Labs Lab 01/30/15 0239 02/02/15 1150 02/03/15 0430  AST 31  --   --   ALT 13*  --   --   ALKPHOS 65  --   --   BILITOT 0.9  --   --   PROT 5.4*  --   --   ALBUMIN 2.0* 2.0* 2.2*   No results for input(s): LIPASE, AMYLASE in the last 168 hours. No results for input(s): AMMONIA in the last 168 hours.  CBC:  Recent Labs Lab 01/29/15 1438 01/30/15 0239 01/31/15 0335 02/01/15 0357 02/02/15 0600 02/03/15 0430  WBC 15.6* 13.5* 13.4* 12.4* 9.7 14.4*  NEUTROABS 12.8*  --   --   --   --   --   HGB 9.4* 7.8* 7.7* 7.6* 8.7* 7.8*  HCT 29.7* 24.9*  25.1* 24.3* 27.8* 25.2*  MCV 96.1 95.0 96.2 96.8 96.5 95.8  PLT 159 136* 142* 143* 140* 159    Cardiac Enzymes: No results for input(s): CKTOTAL, CKMB, CKMBINDEX, TROPONINI in the last 168 hours.  BNP: Invalid input(s): POCBNP  CBG:  Recent Labs Lab 02/02/15 0547 02/02/15 1146 02/02/15 1613 02/02/15 2128 02/03/15 0611  GLUCAP 160* 157* 151* 158* 151*    Microbiology: Results for orders placed or performed during the hospital encounter of 01/06/15  Blood Culture (routine x 2)     Status: None   Collection Time: 01/06/15  6:25 PM  Result Value Ref Range Status   Specimen Description BLOOD LEFT ARM  Final   Special Requests BOTTLES DRAWN AEROBIC AND ANAEROBIC 5CC  Final   Culture   Final    NO GROWTH 5 DAYS Performed at [facility]    Report Status 01/13/2015  FINAL  Final  Blood Culture (routine x 2)     Status: None   Collection Time: 01/06/15  6:40 PM  Result Value Ref Range Status   Specimen Description BLOOD LEFT HAND  Final   Special Requests BOTTLES DRAWN AEROBIC AND ANAEROBIC 5CC  Final   Culture   Final    NO GROWTH 5 DAYS Performed at [facility]    Report Status 01/13/2015 FINAL  Final  Respiratory virus antigens panel     Status: None   Collection Time: 01/06/15  6:40 PM  Result Value Ref Range Status   Source - RVPAN NASAL SWAB  Corrected   Respiratory Syncytial Virus A Negative Negative Final   Respiratory Syncytial Virus B Negative Negative Final   Influenza A Negative Negative Final   Influenza B Negative Negative Final   Parainfluenza 1 Negative Negative Final   Parainfluenza 2 Negative Negative Final   Parainfluenza 3 Negative Negative Final   Metapneumovirus Negative Negative Final   Rhinovirus Negative Negative Final   Adenovirus Negative Negative Final    Comment: (NOTE) Performed At: [facility] [place], [place] [id number] Lindon Romp MD [id number]   Urine culture     Status: None    Collection Time: 01/06/15  6:42 PM  Result Value Ref Range Status   Specimen Description URINE, CLEAN CATCH  Final   Special Requests NONE  Final   Colony Count   Final    25,000 COLONIES/ML Performed at [facility]    Culture   Final    Multiple bacterial morphotypes present, none predominant. Suggest appropriate recollection if clinically indicated. Performed at [facility]    Report Status 01/07/2015 FINAL  Final  Culture, expectorated sputum-assessment     Status: None   Collection Time: 01/07/15  9:55 AM  Result Value Ref Range Status   Specimen Description SPUTUM  Final   Special Requests NONE  Final   Sputum evaluation   Final    MICROSCOPIC FINDINGS SUGGEST THAT THIS SPECIMEN IS NOT REPRESENTATIVE OF LOWER RESPIRATORY SECRETIONS. PLEASE RECOLLECT. Gram Stain Report Called to,Read Back By and Verified With: Vale Haven AT 1336 01/07/15 BY K BARR    Report Status 01/07/2015 FINAL  Final    Coagulation Studies:  Recent Labs  02/01/15 0357 02/02/15 0600 02/03/15 0430  LABPROT 16.3* 17.6* 21.0*  INR 1.29 1.44 1.82*    Urinalysis: No results for input(s): COLORURINE, LABSPEC, PHURINE, GLUCOSEU, HGBUR, BILIRUBINUR, KETONESUR, PROTEINUR, UROBILINOGEN, NITRITE, LEUKOCYTESUR in the last 72 hours.  Invalid input(s): APPERANCEUR    Imaging: No results found.   Medications:   . sodium chloride Stopped (02/01/15 1719)   . cyanocobalamin  500 mcg Oral Daily  . docusate sodium  100 mg Oral BID  . [START ON 02/05/2015] enoxaparin (LOVENOX) injection  100 mg Subcutaneous Q24H  . ferrous sulfate  325 mg Oral BID WC  . insulin aspart  0-9 Units Subcutaneous TID WC  . methylPREDNISolone (SOLU-MEDROL) injection  250 mg Intravenous 3 times per day  . pantoprazole  40 mg Oral Daily  . polyethylene glycol  17 g Oral Daily  . sodium chloride  3 mL Intravenous Q12H  . Warfarin - Pharmacist Dosing Inpatient   Does not apply q1800   acetaminophen **OR**  acetaminophen, alum & mag hydroxide-simeth, hydrALAZINE  Assessment/ Plan:  1. 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 Volume excess/ LE edema - holding fluids, consider lasix  cautiously if/ when creat stabilizes.   LOS: 5 Glynda Soliday W @TODAY @10 :32 AM

## 2015-02-04 ENCOUNTER — Ambulatory Visit: Payer: Medicare Other | Admitting: Adult Health

## 2015-02-04 LAB — GLUCOSE, CAPILLARY
GLUCOSE-CAPILLARY: 154 mg/dL — AB (ref 65–99)
GLUCOSE-CAPILLARY: 156 mg/dL — AB (ref 65–99)
Glucose-Capillary: 160 mg/dL — ABNORMAL HIGH (ref 65–99)
Glucose-Capillary: 132 mg/dL — ABNORMAL HIGH (ref 65–99)

## 2015-02-04 LAB — PROTIME-INR
INR: 1.87 — ABNORMAL HIGH (ref 0.00–1.49)
Prothrombin Time: 21.5 seconds — ABNORMAL HIGH (ref 11.6–15.2)

## 2015-02-04 LAB — CBC
HCT: 27.4 % — ABNORMAL LOW (ref 39.0–52.0)
Hemoglobin: 8.7 g/dL — ABNORMAL LOW (ref 13.0–17.0)
MCH: 30.2 pg (ref 26.0–34.0)
MCHC: 31.8 g/dL (ref 30.0–36.0)
MCV: 95.1 fL (ref 78.0–100.0)
PLATELETS: 137 10*3/uL — AB (ref 150–400)
RBC: 2.88 MIL/uL — AB (ref 4.22–5.81)
RDW: 22.8 % — AB (ref 11.5–15.5)
WBC: 18.2 10*3/uL — ABNORMAL HIGH (ref 4.0–10.5)

## 2015-02-04 LAB — RENAL FUNCTION PANEL
ALBUMIN: 2.3 g/dL — AB (ref 3.5–5.0)
ANION GAP: 9 (ref 5–15)
BUN: 54 mg/dL — ABNORMAL HIGH (ref 6–20)
CALCIUM: 8.1 mg/dL — AB (ref 8.9–10.3)
CHLORIDE: 105 mmol/L (ref 101–111)
CO2: 22 mmol/L (ref 22–32)
CREATININE: 4.58 mg/dL — AB (ref 0.61–1.24)
GFR calc Af Amer: 13 mL/min — ABNORMAL LOW (ref >60)
GFR, EST NON AFRICAN AMERICAN: 12 mL/min — AB (ref >60)
GLUCOSE: 150 mg/dL — AB (ref 65–99)
PHOSPHORUS: 6.1 mg/dL — AB (ref 2.5–4.6)
POTASSIUM: 4.2 mmol/L (ref 3.5–5.1)
Sodium: 136 mmol/L (ref 135–145)

## 2015-02-04 LAB — HEPARIN LEVEL (UNFRACTIONATED)
HEPARIN UNFRACTIONATED: 0.41 [IU]/mL (ref 0.30–0.70)
Heparin Unfractionated: 0.57 IU/mL (ref 0.30–0.70)

## 2015-02-04 LAB — C3 COMPLEMENT: C3 COMPLEMENT: 84 mg/dL (ref 82–167)

## 2015-02-04 LAB — C4 COMPLEMENT: COMPLEMENT C4, BODY FLUID: 21 mg/dL (ref 14–44)

## 2015-02-04 LAB — PROTHROMBIN GENE MUTATION

## 2015-02-04 MED ORDER — PHYTONADIONE 1 MG/0.5 ML ORAL SOLUTION
1.0000 mg | Freq: Once | ORAL | Status: AC
  Administered 2015-02-04: 1 mg via ORAL
  Filled 2015-02-04: qty 0.5

## 2015-02-04 MED ORDER — PREDNISONE 50 MG PO TABS
60.0000 mg | ORAL_TABLET | Freq: Every day | ORAL | Status: DC
  Administered 2015-02-05 – 2015-02-07 (×3): 60 mg via ORAL
  Filled 2015-02-04 (×4): qty 1

## 2015-02-04 NOTE — Progress Notes (Signed)
ANTICOAGULATION CONSULT NOTE - Follow Up Consult  Pharmacy Consult for heparin Indication: PE/DVT  No Known Allergies  Patient Measurements: Height: 5\' 10"  (177.8 cm) Weight: 231 lb 11.2 oz (105.098 kg) IBW/kg (Calculated) : 73 Heparin Dosing Weight: 93kg  Vital Signs: Temp: 97.5 F (36.4 C) (05/24 0457) Temp Source: Oral (05/24 0457) BP: 162/84 mmHg (05/24 0929) Pulse Rate: 72 (05/24 0929)  Labs:  Recent Labs  02/02/15 0600 02/02/15 1150 02/03/15 0430 02/03/15 2051 02/04/15 0508 02/04/15 1257  HGB 8.7*  --  7.8*  --  8.7*  --   HCT 27.8*  --  25.2*  --  27.4*  --   PLT 140*  --  159  --  137*  --   LABPROT 17.6*  --  21.0*  --  21.5*  --   INR 1.44  --  1.82*  --  1.87*  --   HEPARINUNFRC  --   --   --  0.25* 0.41 0.57  CREATININE  --  3.97* 4.12*  --  4.58*  --     Estimated Creatinine Clearance: 17.4 mL/min (by C-G formula based on Cr of 4.58).   Medications:  Scheduled:  . cyanocobalamin  500 mcg Oral Daily  . docusate sodium  100 mg Oral BID  . ferrous sulfate  325 mg Oral BID WC  . insulin aspart  0-9 Units Subcutaneous TID WC  . pantoprazole  40 mg Oral Daily  . polyethylene glycol  17 g Oral Daily  . [START ON 02/05/2015] predniSONE  60 mg Oral Q breakfast  . sodium chloride  3 mL Intravenous Q12H  . Warfarin - Pharmacist Dosing Inpatient   Does not apply q1800    Assessment: 74 yo male with DVT and VQ with intermediate probability for PE. He was on lovenox/coumadin but this in now on hold with plans for biopsy which has been delayed until 5/24. Last lovenox dose was 5/21 and last coumadin dose  was 5/21. INR is up to1.87. Hg is 8.7 but has been low (range 7.7- 8.7 in the last few days), plt= 137  -heparin was started 5/23 as coumadin is on hold for pending biopsy. Heparin level is confirmed at goal (HL= 0.57). Vitamin K 1mg  po given today  Goal of Therapy:  Heparin level 0.3-0.7 units/ml Monitor platelets by anticoagulation protocol: Yes    Plan:  -No heparin changes needed -Daily heparin level and CBC -Possible biopsy in am  Hildred Laser, Pharm D 02/04/2015 1:46 PM

## 2015-02-04 NOTE — Progress Notes (Signed)
ANTICOAGULATION CONSULT NOTE - Follow Up Consult  Pharmacy Consult for heparin Indication: pulmonary embolus and DVT   Labs:  Recent Labs  02/02/15 0600 02/02/15 1150 02/03/15 0430 02/03/15 2051 02/04/15 0508  HGB 8.7*  --  7.8*  --   --   HCT 27.8*  --  25.2*  --   --   PLT 140*  --  159  --   --   LABPROT 17.6*  --  21.0*  --  21.5*  INR 1.44  --  1.82*  --  1.87*  HEPARINUNFRC  --   --   --  0.25* 0.41  CREATININE  --  3.97* 4.12*  --  4.58*     Assessment/Plan:  74yo male therapeutic on heparin after rate adjustment. Will continue gtt at current rate and confirm stable with additional level.   Wynona Neat, PharmD, BCPS  02/04/2015,7:00 AM

## 2015-02-04 NOTE — Progress Notes (Signed)
Utilization review completed.  

## 2015-02-04 NOTE — Care Management Note (Signed)
Case Management Note  Patient Details  Name: Travis [name] MRN: [id number] Date of Birth: 23-May-1941  Subjective/Objective:      Pt admitted with confirmed DVT              Action/Plan:  Anticoagulation planned during admit.  CM consulted for lovenox discharge planning.  CM will continue to monitor for disposition needs.   Expected Discharge Date:                  Expected Discharge Plan:  [place]  In-House Referral:     Discharge planning Services  CM Consult, Medication Assistance  Post Acute Care Choice:  Resumption of Svcs/PTA Provider Choice offered to:     DME Arranged:     DME Agency:     HH Arranged:  RN, PT [place] Agency:  [place]  Status of Service:   In process, will continue to follow  Medicare Important Message Given:  Yes Date Medicare IM Given:  01/30/15 Medicare IM give by:  Elenor Quinones Date Additional Medicare IM Given:  02/03/15 Additional Medicare Important Message give by:  Marvetta Gibbons   If discussed at [employer] of Stay Meetings, dates discussed:  02/04/15   Additional Comments: 01/31/15 Elenor Quinones, RN, BSN [phone]  01/31/15 Elenor Quinones, RN, BSN  [phone].  Wife successfully picked up prescription from pharmacy, instructed by CM to hold use of medication until post discharge/discharge instructions.  Wife stated she understood and medication would be taken home.  No other CM needs at this time.  01/31/15 Elenor Quinones, RN, BSN [phone].  Patient does not have prescription medication coverage, amount due for pt would have caused hardship.  MD requested to write prescription to be faxed to [facility] (cost of $30.62).  Prescription faxed, Gerald Stabs at pharmacy received prescription, currently working on filling.  Wife is on way to pick up prescription for pt and will keep pending discharge/discharge instructions.  MD instructed nurse to order resumption of [facility] services with [facility]  (RN,PT).  Pt has home 02, states he has transport tank in room for discharge.  No additional CM needs at this time.  01/30/15  Elenor Quinones, RN, BSN [phone].  CM submitted benefits check for Lovenox.  Pt states he already has walker and toilet chair and is currently active with [facility].  CM contacted [facility] to make aware of pt admission; pt receives home O2, RN and PT from [facility].  CM will request resumption of orders from MD.  CM was informed by pt that he has VA benefits.  CM contacted [facility] and spoke with Mickel Baas; pt is active for 40% coverage as it relates to pts prior approved condition with his knee only.   Pt states he lost his VA card.  Pt states he has a PCP at [facility]; Dr. Dell Ponto phone number [phone], last visited 5/12, this information was confirmed.  CM will continue to monitor for disposition needs.  Dawayne Patricia, RN 02/04/2015, 11:08 AM

## 2015-02-04 NOTE — Evaluation (Signed)
Physical Therapy Evaluation Patient Details Name: Travis [name] MRN: [id number] DOB: May 24, 1941 Today's Date: 02/04/2015   History of Present Illness  Pt adm with bil LE edema and weakness. +RLE DVT and ?PE (study inconclusive). Pt with recent admissions with pneumonia. Previous workup revealed positive renal failure, and he is beign worked up for autoimmune dysfunction (?Sjogrens); pt also with steriod induced hyperglycemia, normocytic anemia.     Clinical Impression  Pt admitted with above diagnosis. Pt currently with functional limitations due to the deficits listed below (see PT Problem List). Pt with multiple recent admissions with generalized weakness, decr balance, and decr cardiopulmonary status limiting his mobility. Pt will benefit from skilled PT to increase their independence and safety with mobility to allow discharge to the venue listed below.       Follow Up Recommendations No PT follow up;Supervision - Intermittent    Equipment Recommendations  None recommended by PT    Recommendations for Other Services OT consult     Precautions / Restrictions Precautions Precautions: Fall Restrictions Weight Bearing Restrictions: No      Mobility  Bed Mobility                  Transfers Overall transfer level: Needs assistance Equipment used: Rolling walker (2 wheeled) Transfers: Sit to/from Stand Sit to Stand: Min guard         General transfer comment: slow and effortful, near need for assist  Ambulation/Gait Ambulation/Gait assistance: Min guard Ambulation Distance (Feet): 400 Feet Assistive device: Rolling walker (2 wheeled) Gait Pattern/deviations: Step-through pattern;Decreased stride length Gait velocity: too fast for his cardiopulmonary status   General Gait Details: Good use of RW to decr the work of walking; cues to self-monitor for activity tolerance--pt poorly self-regulates and required cues for standing rest breaks due to incr HR and to slow  velocity  Stairs            Wheelchair Mobility    Modified Rankin (Stroke Patients Only)       Balance Overall balance assessment: Needs assistance         Standing balance support: No upper extremity supported Standing balance-Leahy Scale: Fair Standing balance comment: reports bil foot edema makes him feel unsteady                             Pertinent Vitals/Pain HR 87-142 with walking (despite cues to slow down and take standing rest breaks) SaO2 on 2L O2 95-99%  Pain Assessment: No/denies pain    Home Living Family/patient expects to be discharged to:: Private residence Living Arrangements: Spouse/significant other Available Help at Discharge: Family;Available 24 hours/day Type of Home: House Home Access: Stairs to enter Entrance Stairs-Rails: Right;Left;Can reach both Entrance Stairs-Number of Steps: 5 Home Layout: Two level;Laundry or work area in basement;Able to live on main level with bedroom/bathroom Home Equipment: Environmental consultant - 2 wheels;Toilet riser (no handles on toilet riser)      Prior Function Level of Independence: Needs assistance   Gait / Transfers Assistance Needed: using RW modified independent; assist on steps  ADL's / Homemaking Assistance Needed: washing at sink  Comments: Pt retired end of March '16.  Was a phys ed Pharmacist, hospital and privately taught line dancing, kickboxing, and karate.       Hand Dominance   Dominant Hand: Left    Extremity/Trunk Assessment   Upper Extremity Assessment: Overall WFL for tasks assessed (+bil edema)  Lower Extremity Assessment: Generalized weakness (+bil edema; denies RLE pain)      Cervical / Trunk Assessment: Normal  Communication   Communication: No difficulties  Cognition Arousal/Alertness: Awake/alert Behavior During Therapy: WFL for tasks assessed/performed Overall Cognitive Status: Within Functional Limits for tasks assessed                      General  Comments General comments (skin integrity, edema, etc.): wife present    Exercises        Assessment/Plan    PT Assessment Patient needs continued PT services  PT Diagnosis Generalized weakness   PT Problem List Decreased strength;Decreased activity tolerance;Decreased balance;Decreased mobility;Cardiopulmonary status limiting activity  PT Treatment Interventions Gait training;Functional mobility training;Therapeutic activities;Therapeutic exercise;Balance training;Stair training;Patient/family education   PT Goals (Current goals can be found in the Care Plan section) Acute Rehab PT Goals Patient Stated Goal: to go home PT Goal Formulation: With patient Time For Goal Achievement: 02/11/15 Potential to Achieve Goals: Good    Frequency Min 3X/week   Barriers to discharge        Co-evaluation               End of Session Equipment Utilized During Treatment: Oxygen Activity Tolerance: Patient limited by fatigue Patient left: in chair;with call bell/phone within reach;with family/visitor present Nurse Communication: Mobility status         Time: [id number] PT Time Calculation (min) (ACUTE ONLY): 43 min   Charges:   PT Evaluation $Initial PT Evaluation Tier I: 1 Procedure PT Treatments $Gait Training: 23-37 mins   PT G Codes:        Nakina Spatz 03-01-2015, 11:14 AM Pager [phone]

## 2015-02-04 NOTE — Progress Notes (Signed)
[name]  Travis [name] [id number] DOB: 10/21/1940 DOA: 01/29/2015 PCP: [name], MD  Brief interval history: 74 y.o. year-old with hx of DJD and GERD, otherwise healthy until April 2016 in hospital twice with pulm infiltrates (pna vs fibrosis), cough, SOB, LE edema. Work up was ANA negative but was + for RF, antiRo and antiLa and for ANCA. He has been treated with some oral prednisone. He was ordered for home O2 by his PCP the week prior to admission. On 5/18 presented to ED with severe fatigue, LE edema. No SOB or CP. Was seen in pulm clinic and dopplers were + for DVT R leg below the knee. Admitted and started on IV heparin. Creat was up, new since last hosp stay, 2.94 on admission and up to 4.58 today. Patient received 3 days of high dose solumedrol and now on 60. Renal on board. Renal biopsy pending (once INR < 1.5)  Assessment/Plan: 1-acute DVT with intermediate probability for PE -patient VS stable -denies current CP or significant SOB -given renal failure will plan for coumadin as anticoagulation of choice; will need bridge with lovenox and discharge. On heparin now until after renal biopsy. -continue supportive care  2. Acute on chronic renal failure (stage 3 at baseline) --Holding lasix for now --Gentle IV fluid resuscitation provided on admission w/o change in renal function, now has build up some fluids. IVF discontinued --no signs of UTI --Renal ultrasound, normal exam --Follow creatinine trend  --will follow renal service rec's; plan is for high dose steroids and renal biopsy (hopefully will be able to be done on 5/25; held again due to INR of 1.8)  3. Elevated blood pressure without a history of HTN --Monitor for now --PRN hydralazine for SBP > 180 and/or DBP > 110 --was on lasix at home; not use here given acute renal failure and continue worsening  4. History of steroid induced hyperglycemia --per renal and given concerns of TIN/FSGS  will use solumedrol high dose X3 days  --Outpatient rheumatology follow-up pending (was scheduled for 5/25; but will required to be re-schedule given acute hosipitalization). --follow CBG's --A1C 4.9 --continue SSI   5. Acute on chronic anemia --Continue iron and B12 supplements --Protonix since he is now being anticoagulated --will follow Stool heme occult testing --Monitor for transfusion requirement/overt signs of bleeding --Hgb currently 8.7   Code Status: Full Family Communication: wife at bedside Disposition Plan: continue inpatient; home when medically stable; will need renal function to be better.   Consultants: Renal service   Antibiotics:  None   HPI/Subjective: Afebrile, denies CP.. Cr up to 4.58. Patient swelling on his LE bilaterally remains stable and unchanged since 5/22. Biopsy held due to elevated INR  Objective: Filed Vitals:   02/04/15 1439  BP: 161/91  Pulse: 76  Temp: 98 F (36.7 C)  Resp: 18    Intake/Output Summary (Last 24 hours) at 02/04/15 1637 Last data filed at 02/04/15 1300  Gross per 24 hour  Intake    340 ml  Output    475 ml  Net   -135 ml   Filed Weights   02/02/15 0353 02/03/15 0606 02/04/15 0457  Weight: 96.2 kg (212 lb 1.3 oz) 96.9 kg (213 lb 10 oz) 105.098 kg (231 lb 11.2 oz)    Exam:   General:  Afebrile, no CP. Patient with stable LE swelling since 5/22. Worsening Cr; now 4.58  Cardiovascular: S1 and S2, no rubs or gallops  Respiratory: no wheezing, no crackles, decrease air movement  at bases  Abdomen: soft, NT, positive BS  Musculoskeletal: no cyanosis or clubbing; 2++ edema LE bilaterally  Data Reviewed: Basic Metabolic Panel:  Recent Labs Lab 01/30/15 2155 01/31/15 0335 02/01/15 0357 02/02/15 1150 02/03/15 0430 02/04/15 0508  NA  --  137 135 136 137 136  K  --  4.0 4.1 4.9 4.2 4.2  CL  --  106 105 106 106 105  CO2  --  24 22 23 24 22   GLUCOSE  --  102* 93 161* 147* 150*  BUN  --  29* 30* 34* 41*  54*  CREATININE  --  3.08* 3.38* 3.97* 4.12* 4.58*  CALCIUM  --  8.1* 8.0* 8.2* 8.0* 8.1*  MG 2.0  --   --   --   --   --   PHOS  --   --   --  5.6* 5.6* 6.1*   Liver Function Tests:  Recent Labs Lab 01/30/15 0239 02/02/15 1150 02/03/15 0430 02/04/15 0508  AST 31  --   --   --   ALT 13*  --   --   --   ALKPHOS 65  --   --   --   BILITOT 0.9  --   --   --   PROT 5.4*  --   --   --   ALBUMIN 2.0* 2.0* 2.2* 2.3*   CBC:  Recent Labs Lab 01/29/15 1438  01/31/15 0335 02/01/15 0357 02/02/15 0600 02/03/15 0430 02/04/15 0508  WBC 15.6*  < > 13.4* 12.4* 9.7 14.4* 18.2*  NEUTROABS 12.8*  --   --   --   --   --   --   HGB 9.4*  < > 7.7* 7.6* 8.7* 7.8* 8.7*  HCT 29.7*  < > 25.1* 24.3* 27.8* 25.2* 27.4*  MCV 96.1  < > 96.2 96.8 96.5 95.8 95.1  PLT 159  < > 142* 143* 140* 159 137*  < > = values in this interval not displayed. BNP (last 3 results)  Recent Labs  12/17/14 1934 01/02/15 1241 01/06/15 1758  BNP 38.6 66.6 83.3   CBG:  Recent Labs Lab 02/03/15 1643 02/03/15 2143 02/04/15 0613 02/04/15 1111 02/04/15 1603  GLUCAP 139* 120* 154* 156* 160*   Studies: No results found.  Scheduled Meds: . cyanocobalamin  500 mcg Oral Daily  . docusate sodium  100 mg Oral BID  . ferrous sulfate  325 mg Oral BID WC  . insulin aspart  0-9 Units Subcutaneous TID WC  . pantoprazole  40 mg Oral Daily  . polyethylene glycol  17 g Oral Daily  . [START ON 02/05/2015] predniSONE  60 mg Oral Q breakfast  . sodium chloride  3 mL Intravenous Q12H  . Warfarin - Pharmacist Dosing Inpatient   Does not apply q1800   Continuous Infusions: . sodium chloride Stopped (02/01/15 1719)  . heparin 1,650 Units/hr (02/03/15 2141)    Principal Problem:   Acute DVT of right tibial vein Active Problems:   Elevated blood pressure   Steroid-induced hyperglycemia   Peripheral edema   Physical deconditioning   Sjogren's disease   Acute kidney injury   DVT (deep venous thrombosis)   Acute on  chronic renal failure   Time spent: 30 minutes   Barton Dubois  [facility] Pager [phone]. If 7PM-7AM, please contact night-coverage at [email or web address], password [facility] 02/04/2015, 4:37 PM  LOS: 6 days

## 2015-02-04 NOTE — Progress Notes (Signed)
Patient ID: Travis [name], male   DOB: [birth year], 74 y.o.   MRN: [id number]   INR 1.87 today (1.82 yesterday)  Too high for renal bx procedure  Will prepare for tentative procedure 5/25  RN aware May resume diet for today Npo after MN tonight

## 2015-02-04 NOTE — Progress Notes (Signed)
[facility]  Patient Status: Active (receiving services up to time of hospitalization)  [facility] is providing the following services: RN, PT and MSW  If patient discharges after hours, please call [phone].   Travis [name] 02/04/2015, 2:22 PM

## 2015-02-04 NOTE — Progress Notes (Signed)
[facility] ROUNDING NOTE   Subjective:   Interval History: awaiting renal biopsy due to progressive renal failure   Objective:  Vital signs in last 24 hours:  Temp:  [97.5 F (36.4 C)-97.9 F (36.6 C)] 97.5 F (36.4 C) (05/24 0457) Pulse Rate:  [71-85] 82 (05/24 0457) Resp:  [16-18] 18 (05/24 0457) BP: (136-160)/(78-88) 160/88 mmHg (05/24 0457) SpO2:  [97 %-100 %] 97 % (05/24 0457) Weight:  [105.098 kg (231 lb 11.2 oz)] 105.098 kg (231 lb 11.2 oz) (05/24 0457)  Weight change: 8.198 kg (18 lb 1.2 oz) Filed Weights   02/02/15 0353 02/03/15 0606 02/04/15 0457  Weight: 96.2 kg (212 lb 1.3 oz) 96.9 kg (213 lb 10 oz) 105.098 kg (231 lb 11.2 oz)    Intake/Output: I/O last 3 completed shifts: In: 100 [IV Piggyback:100] Out: 500 [Urine:500]   Intake/Output this shift:     CVS- RRR RS- CTA ABD- BS present soft non-distended EXT- edema bilateral   Basic Metabolic Panel:  Recent Labs Lab 01/30/15 2155 01/31/15 0335 02/01/15 0357 02/02/15 1150 02/03/15 0430 02/04/15 0508  NA  --  137 135 136 137 136  K  --  4.0 4.1 4.9 4.2 4.2  CL  --  106 105 106 106 105  CO2  --  24 22 23 24 22   GLUCOSE  --  102* 93 161* 147* 150*  BUN  --  29* 30* 34* 41* 54*  CREATININE  --  3.08* 3.38* 3.97* 4.12* 4.58*  CALCIUM  --  8.1* 8.0* 8.2* 8.0* 8.1*  MG 2.0  --   --   --   --   --   PHOS  --   --   --  5.6* 5.6* 6.1*    Liver Function Tests:  Recent Labs Lab 01/30/15 0239 02/02/15 1150 02/03/15 0430 02/04/15 0508  AST 31  --   --   --   ALT 13*  --   --   --   ALKPHOS 65  --   --   --   BILITOT 0.9  --   --   --   PROT 5.4*  --   --   --   ALBUMIN 2.0* 2.0* 2.2* 2.3*   No results for input(s): LIPASE, AMYLASE in the last 168 hours. No results for input(s): AMMONIA in the last 168 hours.  CBC:  Recent Labs Lab 01/29/15 1438  01/31/15 0335 02/01/15 0357 02/02/15 0600 02/03/15 0430 02/04/15 0508  WBC 15.6*  < > 13.4* 12.4* 9.7 14.4* 18.2*   NEUTROABS 12.8*  --   --   --   --   --   --   HGB 9.4*  < > 7.7* 7.6* 8.7* 7.8* 8.7*  HCT 29.7*  < > 25.1* 24.3* 27.8* 25.2* 27.4*  MCV 96.1  < > 96.2 96.8 96.5 95.8 95.1  PLT 159  < > 142* 143* 140* 159 137*  < > = values in this interval not displayed.  Cardiac Enzymes: No results for input(s): CKTOTAL, CKMB, CKMBINDEX, TROPONINI in the last 168 hours.  BNP: Invalid input(s): POCBNP  CBG:  Recent Labs Lab 02/03/15 0611 02/03/15 1152 02/03/15 1643 02/03/15 2143 02/04/15 0613  GLUCAP 151* 142* 139* 120* 154*    Microbiology: Results for orders placed or performed during the hospital encounter of 01/06/15  Blood Culture (routine x 2)     Status: None   Collection Time: 01/06/15  6:25 PM  Result Value Ref Range Status  Specimen Description BLOOD LEFT ARM  Final   Special Requests BOTTLES DRAWN AEROBIC AND ANAEROBIC 5CC  Final   Culture   Final    NO GROWTH 5 DAYS Performed at [facility]    Report Status 01/13/2015 FINAL  Final  Blood Culture (routine x 2)     Status: None   Collection Time: 01/06/15  6:40 PM  Result Value Ref Range Status   Specimen Description BLOOD LEFT HAND  Final   Special Requests BOTTLES DRAWN AEROBIC AND ANAEROBIC 5CC  Final   Culture   Final    NO GROWTH 5 DAYS Performed at [facility]    Report Status 01/13/2015 FINAL  Final  Respiratory virus antigens panel     Status: None   Collection Time: 01/06/15  6:40 PM  Result Value Ref Range Status   Source - RVPAN NASAL SWAB  Corrected   Respiratory Syncytial Virus A Negative Negative Final   Respiratory Syncytial Virus B Negative Negative Final   Influenza A Negative Negative Final   Influenza B Negative Negative Final   Parainfluenza 1 Negative Negative Final   Parainfluenza 2 Negative Negative Final   Parainfluenza 3 Negative Negative Final   Metapneumovirus Negative Negative Final   Rhinovirus Negative Negative Final   Adenovirus Negative Negative Final     Comment: (NOTE) Performed At: [facility] [address] [place], [place] [id number] Lindon Romp MD [id number]   Urine culture     Status: None   Collection Time: 01/06/15  6:42 PM  Result Value Ref Range Status   Specimen Description URINE, CLEAN CATCH  Final   Special Requests NONE  Final   Colony Count   Final    25,000 COLONIES/ML Performed at [facility]    Culture   Final    Multiple bacterial morphotypes present, none predominant. Suggest appropriate recollection if clinically indicated. Performed at [facility]    Report Status 01/07/2015 FINAL  Final  Culture, expectorated sputum-assessment     Status: None   Collection Time: 01/07/15  9:55 AM  Result Value Ref Range Status   Specimen Description SPUTUM  Final   Special Requests NONE  Final   Sputum evaluation   Final    MICROSCOPIC FINDINGS SUGGEST THAT THIS SPECIMEN IS NOT REPRESENTATIVE OF LOWER RESPIRATORY SECRETIONS. PLEASE RECOLLECT. Gram Stain Report Called to,Read Back By and Verified With: Vale Haven AT 1336 01/07/15 BY K BARR    Report Status 01/07/2015 FINAL  Final    Coagulation Studies:  Recent Labs  02/02/15 0600 02/03/15 0430 02/04/15 0508  LABPROT 17.6* 21.0* 21.5*  INR 1.44 1.82* 1.87*    Urinalysis: No results for input(s): COLORURINE, LABSPEC, PHURINE, GLUCOSEU, HGBUR, BILIRUBINUR, KETONESUR, PROTEINUR, UROBILINOGEN, NITRITE, LEUKOCYTESUR in the last 72 hours.  Invalid input(s): APPERANCEUR    Imaging: No results found.   Medications:   . sodium chloride Stopped (02/01/15 1719)  . heparin 1,650 Units/hr (02/03/15 2141)   . cyanocobalamin  500 mcg Oral Daily  . docusate sodium  100 mg Oral BID  . ferrous sulfate  325 mg Oral BID WC  . insulin aspart  0-9 Units Subcutaneous TID WC  . methylPREDNISolone (SOLU-MEDROL) injection  250 mg Intravenous 3 times per day  . pantoprazole  40 mg Oral Daily  . polyethylene glycol  17 g Oral Daily  .  sodium chloride  3 mL Intravenous Q12H  . Warfarin - Pharmacist Dosing Inpatient   Does not apply [phone]  acetaminophen **OR** acetaminophen, alum & mag hydroxide-simeth, hydrALAZINE  Assessment/ Plan:   Acute renal injury with progressive increase in creatinine and protienuria.  urinalysis in April unremarkable.  ANCA negative and Anti GBM negative   Biopsy should help although high risk of bleeding from anticoagulation. Will reduce steroid dose to 60mg  daily.    LOS: 6 Kareem Cathey W @TODAY @8 :26 AM

## 2015-02-04 NOTE — Evaluation (Signed)
Occupational Therapy Evaluation Patient Details Name: Travis [name] MRN: [id number] DOB: 03/12/1941 Today's Date: 02/04/2015    History of Present Illness Pt adm with bil LE edema and weakness. +RLE DVT and ?PE (study inconclusive). Pt with recent admissions with pneumonia. Previous workup revealed positive renal failure, and he is beign worked up for autoimmune dysfunction (?Sjogrens); pt also with steriod induced hyperglycemia, normocytic anemia.    Clinical Impression   Pt admitted with the above diagnosis and has the deficits listed below. Pt would benefit from acute OT to increase awareness of energy conservation techniques during adls and to increase independence with basic adls back to mod I or I.     Follow Up Recommendations  No OT follow up;Supervision - Intermittent    Equipment Recommendations  None recommended by OT    Recommendations for Other Services       Precautions / Restrictions Precautions Precautions: Fall Precaution Comments: monitor O2 sats and HR Restrictions Weight Bearing Restrictions: No      Mobility Bed Mobility Overal bed mobility: Needs Assistance Bed Mobility: Sit to Supine       Sit to supine: Supervision   General bed mobility comments: Pt did not require assist returning to bed.  Transfers Overall transfer level: Needs assistance Equipment used: Rolling walker (2 wheeled) Transfers: Sit to/from Stand Sit to Stand: Min guard         General transfer comment: Pt able to get up w/o physical assist; but there if needed for balance.    Balance Overall balance assessment: Needs assistance Sitting-balance support: Feet supported Sitting balance-Leahy Scale: Good     Standing balance support: Bilateral upper extremity supported;During functional activity Standing balance-Leahy Scale: Fair Standing balance comment: Pt with edema in B feet.  no LOB noted but therapist close by. Pt not good about paying attn to lines when on his  feet.                            ADL Overall ADL's : Needs assistance/impaired Eating/Feeding: Independent;Sitting   Grooming: Oral care;Wash/dry face;Wash/dry hands;Supervision/safety;Standing   Upper Body Bathing: Set up;Sitting   Lower Body Bathing: Minimal assistance;Sit to/from stand Lower Body Bathing Details (indicate cue type and reason): min assist to reach B feet and to reach bottom while standing and maintaining balance. Upper Body Dressing : Set up;Sitting   Lower Body Dressing: Moderate assistance;Sit to/from stand Lower Body Dressing Details (indicate cue type and reason): Assist with socks and shoes Bly and sometimes needs assist starting pants over feet.  Assist to stand, maintain balance and pull pants up. Toilet Transfer: Supervision/safety;Ambulation;Comfort height toilet;Grab bars Toilet Transfer Details (indicate cue type and reason): pt walked to bathroom with walker and O2. Toileting- Clothing Manipulation and Hygiene: Supervision/safety;Sit to/from stand Toileting - Clothing Manipulation Details (indicate cue type and reason): Pt required cues to get walker in front of him before standing after toileting for safety.     Functional mobility during ADLs: Min guard;Rolling walker General ADL Comments: Pt doing well with adls overall.  Pt may benefit from some energy conservation education.  Pt is a PE teacher by trade and is accustomed to doing what he wants when he wants to do it.  He is not used to having to take rest breaks during his activity but needs to now due to renal issues and increased heart rate.     Vision Vision Assessment?: No apparent visual deficits   Perception  Praxis      Pertinent Vitals/Pain Pain Assessment: No/denies pain     Hand Dominance Left   Extremity/Trunk Assessment Upper Extremity Assessment Upper Extremity Assessment: Overall WFL for tasks assessed   Lower Extremity Assessment Lower Extremity Assessment:  Defer to PT evaluation   Cervical / Trunk Assessment Cervical / Trunk Assessment: Normal   Communication Communication Communication: No difficulties   Cognition Arousal/Alertness: Awake/alert Behavior During Therapy: WFL for tasks assessed/performed Overall Cognitive Status: Within Functional Limits for tasks assessed                     General Comments       Exercises Exercises:  (deferred due to approaching subtherapeutic anticoag)     Shoulder Instructions      Home Living Family/patient expects to be discharged to:: Private residence Living Arrangements: Spouse/significant other Available Help at Discharge: Family;Available 24 hours/day Type of Home: House Home Access: Stairs to enter [employer] of Steps: 5 Entrance Stairs-Rails: Right;Left;Can reach both Home Layout: Two level;Laundry or work area in basement;Able to live on main level with bedroom/bathroom     Bathroom Shower/Tub: Other (comment);Walk-in shower (sponge bathes only)   Bathroom Toilet: Standard     Home Equipment: Walker - 2 wheels;Toilet riser          Prior Functioning/Environment Level of Independence: Needs assistance  Gait / Transfers Assistance Needed: using RW modified independent; assist on steps ADL's / Homemaking Assistance Needed: wife helps pt as needed. Some days he can bathe and dress some days he needs assist.   Comments: Pt retired end of March '16.  Was a phys ed Pharmacist, hospital and privately taught line dancing, kickboxing, and karate.      OT Diagnosis: Generalized weakness;Acute pain   OT Problem List: Decreased activity tolerance;Decreased safety awareness;Decreased knowledge of use of DME or AE;Pain   OT Treatment/Interventions: Self-care/ADL training;Therapeutic activities;DME and/or AE instruction    OT Goals(Current goals can be found in the care plan section) Acute Rehab OT Goals Patient Stated Goal: to go home OT Goal Formulation: With  patient/family Time For Goal Achievement: 02/18/15 Potential to Achieve Goals: Good ADL Goals Pt Will Perform Lower Body Bathing: with modified independence;sit to/from stand Pt Will Perform Lower Body Dressing: with modified independence;sit to/from stand;with adaptive equipment Additional ADL Goal #1: Pt will walk to bathroom and toilet with 3:1 over commode with mod I. Additional ADL Goal #2: Pt will state 3 energy conservation techniques he could use at home to attempt save energy during adls with no assist.  OT Frequency: Min 2X/week   Barriers to D/C:            Co-evaluation              End of Session Equipment Utilized During Treatment: Rolling walker Nurse Communication: Mobility status  Activity Tolerance: Patient limited by fatigue Patient left: in bed;with call bell/phone within reach;with family/visitor present   Time: 1230-1257 OT Time Calculation (min): 27 min Charges:  OT General Charges $OT Visit: 1 Procedure OT Evaluation $Initial OT Evaluation Tier I: 1 Procedure OT Treatments $Self Care/Home Management : 8-22 mins G-Codes:    Glenford Peers 02-Mar-2015, 1:11 PM  [phone]

## 2015-02-05 ENCOUNTER — Inpatient Hospital Stay (HOSPITAL_COMMUNITY): Payer: Medicare Other

## 2015-02-05 DIAGNOSIS — R739 Hyperglycemia, unspecified: Secondary | ICD-10-CM

## 2015-02-05 DIAGNOSIS — R5381 Other malaise: Secondary | ICD-10-CM

## 2015-02-05 DIAGNOSIS — R030 Elevated blood-pressure reading, without diagnosis of hypertension: Secondary | ICD-10-CM

## 2015-02-05 LAB — RENAL FUNCTION PANEL
ANION GAP: 12 (ref 5–15)
Albumin: 2.2 g/dL — ABNORMAL LOW (ref 3.5–5.0)
BUN: 64 mg/dL — ABNORMAL HIGH (ref 6–20)
CO2: 20 mmol/L — AB (ref 22–32)
CREATININE: 4.81 mg/dL — AB (ref 0.61–1.24)
Calcium: 8.1 mg/dL — ABNORMAL LOW (ref 8.9–10.3)
Chloride: 104 mmol/L (ref 101–111)
GFR calc Af Amer: 13 mL/min — ABNORMAL LOW (ref >60)
GFR calc non Af Amer: 11 mL/min — ABNORMAL LOW (ref >60)
GLUCOSE: 120 mg/dL — AB (ref 65–99)
Phosphorus: 5.6 mg/dL — ABNORMAL HIGH (ref 2.5–4.6)
Potassium: 3.7 mmol/L (ref 3.5–5.1)
Sodium: 136 mmol/L (ref 135–145)

## 2015-02-05 LAB — GLUCOSE, CAPILLARY
GLUCOSE-CAPILLARY: 115 mg/dL — AB (ref 65–99)
GLUCOSE-CAPILLARY: 110 mg/dL — AB (ref 65–99)
Glucose-Capillary: 138 mg/dL — ABNORMAL HIGH (ref 65–99)
Glucose-Capillary: 132 mg/dL — ABNORMAL HIGH (ref 65–99)

## 2015-02-05 LAB — CBC
HCT: 29.5 % — ABNORMAL LOW (ref 39.0–52.0)
Hemoglobin: 9.2 g/dL — ABNORMAL LOW (ref 13.0–17.0)
MCH: 29.9 pg (ref 26.0–34.0)
MCHC: 31.2 g/dL (ref 30.0–36.0)
MCV: 95.8 fL (ref 78.0–100.0)
Platelets: 122 10*3/uL — ABNORMAL LOW (ref 150–400)
RBC: 3.08 MIL/uL — AB (ref 4.22–5.81)
RDW: 23.4 % — ABNORMAL HIGH (ref 11.5–15.5)
WBC: 22.8 10*3/uL — AB (ref 4.0–10.5)

## 2015-02-05 LAB — PROTIME-INR
INR: 1.56 — ABNORMAL HIGH (ref 0.00–1.49)
PROTHROMBIN TIME: 18.7 s — AB (ref 11.6–15.2)

## 2015-02-05 LAB — HEPARIN LEVEL (UNFRACTIONATED): Heparin Unfractionated: 0.59 IU/mL (ref 0.30–0.70)

## 2015-02-05 MED ORDER — MIDAZOLAM HCL 2 MG/2ML IJ SOLN
INTRAMUSCULAR | Status: AC
  Filled 2015-02-05: qty 2

## 2015-02-05 MED ORDER — FENTANYL CITRATE (PF) 100 MCG/2ML IJ SOLN
INTRAMUSCULAR | Status: AC
  Filled 2015-02-05: qty 2

## 2015-02-05 MED ORDER — HEPARIN (PORCINE) IN NACL 100-0.45 UNIT/ML-% IJ SOLN
1500.0000 [IU]/h | INTRAMUSCULAR | Status: DC
  Administered 2015-02-05 (×2): 1200 [IU]/h via INTRAVENOUS
  Administered 2015-02-06: 1500 [IU]/h via INTRAVENOUS
  Filled 2015-02-05 (×4): qty 250

## 2015-02-05 MED ORDER — WARFARIN SODIUM 7.5 MG PO TABS
7.5000 mg | ORAL_TABLET | Freq: Once | ORAL | Status: AC
Start: 2015-02-05 — End: 2015-02-05
  Administered 2015-02-05: 7.5 mg via ORAL
  Filled 2015-02-05: qty 1

## 2015-02-05 MED ORDER — MIDAZOLAM HCL 2 MG/2ML IJ SOLN
INTRAMUSCULAR | Status: AC | PRN
  Administered 2015-02-05: 1 mg via INTRAVENOUS

## 2015-02-05 MED ORDER — FENTANYL CITRATE (PF) 100 MCG/2ML IJ SOLN
INTRAMUSCULAR | Status: AC | PRN
  Administered 2015-02-05: 50 ug via INTRAVENOUS

## 2015-02-05 MED ORDER — LIDOCAINE HCL (PF) 1 % IJ SOLN
INTRAMUSCULAR | Status: AC
  Filled 2015-02-05: qty 10

## 2015-02-05 NOTE — Progress Notes (Signed)
[name] Interim History: 74 y.o. year-old with hx of DJD and GERD, otherwise healthy until April 2016 in hospital twice with pulm infiltrates (pna vs fibrosis), cough, SOB, LE edema. Work up was ANA negative but was + for RF, antiRo and antiLa and for ANCA. He has been treated with some oral prednisone. He was ordered for home O2 by his PCP the week prior to admission. On 5/18 presented to ED with severe fatigue, LE edema. No SOB or CP. Was seen in pulm clinic and dopplers were + for DVT R leg below the knee. Admitted and started on IV heparin. Creat was up, new since last hosp stay, 2.94 on admission and up to 4.58 today. Patient received 3 days of high dose solumedrol and now on 60. Renal on board. Renal biopsy pending (once INR < 1.5)   Assessment/Plan: Acute DVT of right tibial vein with intermediate probability VQ scan: - Trace symptom-free he was started on heparin and Coumadin. - Coumadin had to be on hold for renal biopsy.  Acute on chronic kidney disease stage III: Renal ultrasound was done that showed no acute abnormalities he was started on gentle IV hydration with improvement of function. I discontinued most consulted and recommended renal biopsy. - Steroids were started empirically.  Elevated blood pressure without a diagnosis of hypertension: Continue hydralazine when necessary. And continue to monitor.  Steroid-induced hyperglycemia: Start on IV Solu-Medrol for 3 days, resume on A1c was 4.9 and continue sliding scale insulin.  Acute on chronic anemia: Continue iron and B-12 supplements. Continue Protonix. follow-up with his PCP as an outpatient.  Physical deconditioning - Consider physical therapy.  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   Consultants:  Renal  IR  Procedures:  Renal biopsy on 5.26.2016  Antibiotics:  None  HPI/Subjective: Hungry would like to eat  Objective: Filed Vitals:   02/04/15 0929 02/04/15 1439 02/04/15 2053 02/05/15 0533  BP: 162/84 161/91 173/92 156/82  Pulse: 72 76 83 85  Temp:  98 F (36.7 C) 98.1 F (36.7 C) 97.6 F (36.4 C)  TempSrc:  Oral Oral Oral  Resp:  18 18 18   Height:      Weight:    97.433 kg (214 lb 12.8 oz)  SpO2:  97% 98% 96%    Intake/Output Summary (Last 24 hours) at 02/05/15 1208 Last data filed at 02/05/15 1049  Gross per 24 hour  Intake    390 ml  Output   1275 ml  Net   -885 ml   Filed Weights   02/03/15 0606 02/04/15 0457 02/05/15 0533  Weight: 96.9 kg (213 lb 10 oz) 105.098 kg (231 lb 11.2 oz) 97.433 kg (214 lb 12.8 oz)    Exam:  General: Alert, awake, oriented x3, in no acute distress.  HEENT: No bruits, no goiter.  Heart: Regular rate and rhythm. Lungs: Good air movement, clear.  Abdomen: Soft, nontender, nondistended, positive bowel sounds.  Neuro: Grossly intact, nonfocal.   Data Reviewed: Basic Metabolic Panel:  Recent Labs Lab 01/30/15 2155  02/01/15 0357 02/02/15 1150 02/03/15 0430 02/04/15 0508 02/05/15 0514  NA  --   < > 135 136 137 136 136  K  --   < > 4.1 4.9 4.2 4.2 3.7  CL  --   < > 105 106 106 105 104  CO2  --   < > 22 23 24 22  20*  GLUCOSE  --   < > 93 161* 147* 150*  120*  BUN  --   < > 30* 34* 41* 54* 64*  CREATININE  --   < > 3.38* 3.97* 4.12* 4.58* 4.81*  CALCIUM  --   < > 8.0* 8.2* 8.0* 8.1* 8.1*  MG 2.0  --   --   --   --   --   --   PHOS  --   --   --  5.6* 5.6* 6.1* 5.6*  < > = values in this interval not displayed. Liver Function Tests:  Recent Labs Lab 01/30/15 0239 02/02/15 1150 02/03/15 0430 02/04/15 0508 02/05/15 0514  AST 31  --   --   --   --   ALT 13*  --   --   --   --   ALKPHOS 65  --   --   --   --   BILITOT 0.9  --   --   --   --   PROT 5.4*  --   --   --   --   ALBUMIN 2.0* 2.0* 2.2* 2.3* 2.2*   No results for input(s): LIPASE, AMYLASE in the last 168 hours. No results for input(s): AMMONIA in the last 168 hours. CBC:  Recent Labs Lab  01/29/15 1438  02/01/15 0357 02/02/15 0600 02/03/15 0430 02/04/15 0508 02/05/15 0514  WBC 15.6*  < > 12.4* 9.7 14.4* 18.2* 22.8*  NEUTROABS 12.8*  --   --   --   --   --   --   HGB 9.4*  < > 7.6* 8.7* 7.8* 8.7* 9.2*  HCT 29.7*  < > 24.3* 27.8* 25.2* 27.4* 29.5*  MCV 96.1  < > 96.8 96.5 95.8 95.1 95.8  PLT 159  < > 143* 140* 159 137* 122*  < > = values in this interval not displayed. Cardiac Enzymes: No results for input(s): CKTOTAL, CKMB, CKMBINDEX, TROPONINI in the last 168 hours. BNP (last 3 results)  Recent Labs  12/17/14 1934 01/02/15 1241 01/06/15 1758  BNP 38.6 66.6 83.3    ProBNP (last 3 results) No results for input(s): PROBNP in the last 8760 hours.  CBG:  Recent Labs Lab 02/04/15 1111 02/04/15 1603 02/04/15 2140 02/05/15 0621 02/05/15 1112  GLUCAP 156* 160* 132* 115* 138*    No results found for this or any previous visit (from the past 240 hour(s)).   Studies: No results found.  Scheduled Meds: . cyanocobalamin  500 mcg Oral Daily  . docusate sodium  100 mg Oral BID  . ferrous sulfate  325 mg Oral BID WC  . insulin aspart  0-9 Units Subcutaneous TID WC  . pantoprazole  40 mg Oral Daily  . polyethylene glycol  17 g Oral Daily  . predniSONE  60 mg Oral Q breakfast  . sodium chloride  3 mL Intravenous Q12H  . Warfarin - Pharmacist Dosing Inpatient   Does not apply q1800   Continuous Infusions: . sodium chloride Stopped (02/01/15 1719)  . heparin 1,650 Units/hr (02/04/15 2228)    Time Spent: 15 min   Charlynne Cousins  [facility] Pager [phone]. If 7PM-7AM, please contact night-coverage at [email or web address], password [facility] 02/05/2015, 12:08 PM  LOS: 7 days

## 2015-02-05 NOTE — Progress Notes (Signed)
[facility] ROUNDING NOTE   Subjective:   Interval History: awaiting biopsy (  held due to high INR )  Objective:  Vital signs in last 24 hours:  Temp:  [97.6 F (36.4 C)-98.1 F (36.7 C)] 97.6 F (36.4 C) (05/25 0533) Pulse Rate:  [76-85] 85 (05/25 0533) Resp:  [18] 18 (05/25 0533) BP: (156-173)/(82-92) 156/82 mmHg (05/25 0533) SpO2:  [96 %-98 %] 96 % (05/25 0533) Weight:  [97.433 kg (214 lb 12.8 oz)] 97.433 kg (214 lb 12.8 oz) (05/25 0533)  Weight change: -7.666 kg (-16 lb 14.4 oz) Filed Weights   02/03/15 0606 02/04/15 0457 02/05/15 0533  Weight: 96.9 kg (213 lb 10 oz) 105.098 kg (231 lb 11.2 oz) 97.433 kg (214 lb 12.8 oz)    Intake/Output: I/O last 3 completed shifts: In: 610 [P.O.:360; I.V.:150; IV Piggyback:100] Out: 1075 [Urine:1075]   Intake/Output this shift:  Total I/O In: -  Out: 400 [Urine:400]  CVS- RRR RS- CTA ABD- BS present soft non-distended EXT- no edema   Basic Metabolic Panel:  Recent Labs Lab 01/30/15 2155  02/01/15 0357 02/02/15 1150 02/03/15 0430 02/04/15 0508 02/05/15 0514  NA  --   < > 135 136 137 136 136  K  --   < > 4.1 4.9 4.2 4.2 3.7  CL  --   < > 105 106 106 105 104  CO2  --   < > 22 23 24 22  20*  GLUCOSE  --   < > 93 161* 147* 150* 120*  BUN  --   < > 30* 34* 41* 54* 64*  CREATININE  --   < > 3.38* 3.97* 4.12* 4.58* 4.81*  CALCIUM  --   < > 8.0* 8.2* 8.0* 8.1* 8.1*  MG 2.0  --   --   --   --   --   --   PHOS  --   --   --  5.6* 5.6* 6.1* 5.6*  < > = values in this interval not displayed.  Liver Function Tests:  Recent Labs Lab 01/30/15 0239 02/02/15 1150 02/03/15 0430 02/04/15 0508 02/05/15 0514  AST 31  --   --   --   --   ALT 13*  --   --   --   --   ALKPHOS 65  --   --   --   --   BILITOT 0.9  --   --   --   --   PROT 5.4*  --   --   --   --   ALBUMIN 2.0* 2.0* 2.2* 2.3* 2.2*   No results for input(s): LIPASE, AMYLASE in the last 168 hours. No results for input(s): AMMONIA in the last 168  hours.  CBC:  Recent Labs Lab 01/29/15 1438  02/01/15 0357 02/02/15 0600 02/03/15 0430 02/04/15 0508 02/05/15 0514  WBC 15.6*  < > 12.4* 9.7 14.4* 18.2* 22.8*  NEUTROABS 12.8*  --   --   --   --   --   --   HGB 9.4*  < > 7.6* 8.7* 7.8* 8.7* 9.2*  HCT 29.7*  < > 24.3* 27.8* 25.2* 27.4* 29.5*  MCV 96.1  < > 96.8 96.5 95.8 95.1 95.8  PLT 159  < > 143* 140* 159 137* 122*  < > = values in this interval not displayed.  Cardiac Enzymes: No results for input(s): CKTOTAL, CKMB, CKMBINDEX, TROPONINI in the last 168 hours.  BNP: Invalid input(s): POCBNP  CBG:  Recent Labs Lab 02/04/15 0613 02/04/15 1111 02/04/15 1603 02/04/15 2140 02/05/15 0621  GLUCAP 154* 156* 160* 132* 115*    Microbiology: Results for orders placed or performed during the hospital encounter of 01/06/15  Blood Culture (routine x 2)     Status: None   Collection Time: 01/06/15  6:25 PM  Result Value Ref Range Status   Specimen Description BLOOD LEFT ARM  Final   Special Requests BOTTLES DRAWN AEROBIC AND ANAEROBIC 5CC  Final   Culture   Final    NO GROWTH 5 DAYS Performed at [facility]    Report Status 01/13/2015 FINAL  Final  Blood Culture (routine x 2)     Status: None   Collection Time: 01/06/15  6:40 PM  Result Value Ref Range Status   Specimen Description BLOOD LEFT HAND  Final   Special Requests BOTTLES DRAWN AEROBIC AND ANAEROBIC 5CC  Final   Culture   Final    NO GROWTH 5 DAYS Performed at [facility]    Report Status 01/13/2015 FINAL  Final  Respiratory virus antigens panel     Status: None   Collection Time: 01/06/15  6:40 PM  Result Value Ref Range Status   Source - RVPAN NASAL SWAB  Corrected   Respiratory Syncytial Virus A Negative Negative Final   Respiratory Syncytial Virus B Negative Negative Final   Influenza A Negative Negative Final   Influenza B Negative Negative Final   Parainfluenza 1 Negative Negative Final   Parainfluenza 2 Negative Negative Final    Parainfluenza 3 Negative Negative Final   Metapneumovirus Negative Negative Final   Rhinovirus Negative Negative Final   Adenovirus Negative Negative Final    Comment: (NOTE) Performed At: [facility] [address] [place], [place] [id number] Lindon Romp MD [id number]   Urine culture     Status: None   Collection Time: 01/06/15  6:42 PM  Result Value Ref Range Status   Specimen Description URINE, CLEAN CATCH  Final   Special Requests NONE  Final   Colony Count   Final    25,000 COLONIES/ML Performed at [facility]    Culture   Final    Multiple bacterial morphotypes present, none predominant. Suggest appropriate recollection if clinically indicated. Performed at [facility]    Report Status 01/07/2015 FINAL  Final  Culture, expectorated sputum-assessment     Status: None   Collection Time: 01/07/15  9:55 AM  Result Value Ref Range Status   Specimen Description SPUTUM  Final   Special Requests NONE  Final   Sputum evaluation   Final    MICROSCOPIC FINDINGS SUGGEST THAT THIS SPECIMEN IS NOT REPRESENTATIVE OF LOWER RESPIRATORY SECRETIONS. PLEASE RECOLLECT. Gram Stain Report Called to,Read Back By and Verified With: Vale Haven AT 1336 01/07/15 BY K BARR    Report Status 01/07/2015 FINAL  Final    Coagulation Studies:  Recent Labs  02/03/15 0430 02/04/15 0508 02/05/15 0514  LABPROT 21.0* 21.5* 18.7*  INR 1.82* 1.87* 1.56*    Urinalysis: No results for input(s): COLORURINE, LABSPEC, PHURINE, GLUCOSEU, HGBUR, BILIRUBINUR, KETONESUR, PROTEINUR, UROBILINOGEN, NITRITE, LEUKOCYTESUR in the last 72 hours.  Invalid input(s): APPERANCEUR    Imaging: No results found.   Medications:   . sodium chloride Stopped (02/01/15 1719)  . heparin 1,650 Units/hr (02/04/15 2228)   . cyanocobalamin  500 mcg Oral Daily  . docusate sodium  100 mg Oral BID  . ferrous sulfate  325 mg Oral BID WC  .  insulin aspart  0-9 Units Subcutaneous TID WC   . pantoprazole  40 mg Oral Daily  . polyethylene glycol  17 g Oral Daily  . predniSONE  60 mg Oral Q breakfast  . sodium chloride  3 mL Intravenous Q12H  . Warfarin - Pharmacist Dosing Inpatient   Does not apply q1800   acetaminophen **OR** acetaminophen, alum & mag hydroxide-simeth, hydrALAZINE  Assessment/ Plan:  1. 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 Volume excess/ LE edema - holding fluids, consider lasix cautiously if/ when creat stabilizes.    LOS: 7 Ricky Gallery W @TODAY @11 :03 AM

## 2015-02-05 NOTE — Progress Notes (Addendum)
ANTICOAGULATION CONSULT NOTE - Follow Up Consult  Pharmacy Consult for heparin Indication: PE/DVT  No Known Allergies  Patient Measurements: Height: 5\' 10"  (177.8 cm) Weight: 214 lb 12.8 oz (97.433 kg) IBW/kg (Calculated) : 73 Heparin Dosing Weight: 93kg  Vital Signs: Temp: 98 F (36.7 C) (05/25 1513) Temp Source: Oral (05/25 1513) BP: 151/84 mmHg (05/25 1513) Pulse Rate: 83 (05/25 1513)  Labs:  Recent Labs  02/03/15 0430  02/04/15 0508 02/04/15 1257 02/05/15 0514 02/05/15 1036  HGB 7.8*  --  8.7*  --  9.2*  --   HCT 25.2*  --  27.4*  --  29.5*  --   PLT 159  --  137*  --  122*  --   LABPROT 21.0*  --  21.5*  --  18.7*  --   INR 1.82*  --  1.87*  --  1.56*  --   HEPARINUNFRC  --   < > 0.41 0.57  --  0.59  CREATININE 4.12*  --  4.58*  --  4.81*  --   < > = values in this interval not displayed.  Estimated Creatinine Clearance: 16 mL/min (by C-G formula based on Cr of 4.81).   Medications:  Scheduled:  . cyanocobalamin  500 mcg Oral Daily  . docusate sodium  100 mg Oral BID  . fentaNYL      . ferrous sulfate  325 mg Oral BID WC  . insulin aspart  0-9 Units Subcutaneous TID WC  . lidocaine (PF)      . midazolam      . pantoprazole  40 mg Oral Daily  . polyethylene glycol  17 g Oral Daily  . predniSONE  60 mg Oral Q breakfast  . sodium chloride  3 mL Intravenous Q12H  . Warfarin - Pharmacist Dosing Inpatient   Does not apply q1800    Assessment: 74 yo male with DVT and VQ with intermediate probability for PE. He was on lovenox/coumadin but this in now on hold with plans for biopsy which has been delayed. Last lovenox dose was 5/21 and last coumadin dose was 5/21. INR is 1.56. Hg is 9.2 and has been low (range 7.7- 8.7 in the last few days), plt= 122. Plans are now anticoagulation with heparin.  -He is now s/p biopsy. Discussed with Dr. Linard Millers and to restart heparin 4 hours post biopsy. Ok to resume coumadin tonight. Last heparin rate was 1650 units/hr and  heparin was at goal. With recent biopsy will start heparin at a lower dose.   Goal of Therapy:  Heparin level 0.3-0.7 units/ml Monitor platelets by anticoagulation protocol: Yes   Plan:  -Begin heparin at 1200 units/hr at 7pm -Heparin level  daily wth CBC daily  Hildred Laser, Pharm D 02/05/2015 4:17 PM

## 2015-02-05 NOTE — Progress Notes (Signed)
ANTICOAGULATION CONSULT NOTE - Follow Up Consult  Pharmacy Consult for heparin Indication: PE/DVT  No Known Allergies  Patient Measurements: Height: 5\' 10"  (177.8 cm) Weight: 214 lb 12.8 oz (97.433 kg) IBW/kg (Calculated) : 73 Heparin Dosing Weight: 93kg  Vital Signs: Temp: 97.6 F (36.4 C) (05/25 0533) Temp Source: Oral (05/25 0533) BP: 156/82 mmHg (05/25 0533) Pulse Rate: 85 (05/25 0533)  Labs:  Recent Labs  02/03/15 0430  02/04/15 0508 02/04/15 1257 02/05/15 0514 02/05/15 1036  HGB 7.8*  --  8.7*  --  9.2*  --   HCT 25.2*  --  27.4*  --  29.5*  --   PLT 159  --  137*  --  122*  --   LABPROT 21.0*  --  21.5*  --  18.7*  --   INR 1.82*  --  1.87*  --  1.56*  --   HEPARINUNFRC  --   < > 0.41 0.57  --  0.59  CREATININE 4.12*  --  4.58*  --  4.81*  --   < > = values in this interval not displayed.  Estimated Creatinine Clearance: 16 mL/min (by C-G formula based on Cr of 4.81).   Medications:  Scheduled:  . cyanocobalamin  500 mcg Oral Daily  . docusate sodium  100 mg Oral BID  . ferrous sulfate  325 mg Oral BID WC  . insulin aspart  0-9 Units Subcutaneous TID WC  . pantoprazole  40 mg Oral Daily  . polyethylene glycol  17 g Oral Daily  . predniSONE  60 mg Oral Q breakfast  . sodium chloride  3 mL Intravenous Q12H  . Warfarin - Pharmacist Dosing Inpatient   Does not apply q1800    Assessment: 74 yo male with DVT and VQ with intermediate probability for PE. He was on lovenox/coumadin but this in now on hold with plans for biopsy which has been delayed. Last lovenox dose was 5/21 and last coumadin dose was 5/21. INR is 1.56. Hg is 9.2 and has been low (range 7.7- 8.7 in the last few days), plt= 122, Heparin level is at goal (HL= 0.59).  -He is noted with progressive renal failure and was on lovenox previously. Discussed with Dr. Olevia Bowens and plans are to begin IV heparin post biopsy.  Goal of Therapy:  Heparin level 0.3-0.7 units/ml Monitor platelets by  anticoagulation protocol: Yes   Plan:  -No heparin changes needed -Daily heparin level and CBC -Will follow plans for biopsy -With progressive renal failure plans are IV heparin post biopsy (begin post biopsy when ok with Radiology)   Hildred Laser, Pharm D 02/05/2015 11:48 AM

## 2015-02-05 NOTE — Procedures (Signed)
Procedure:  Ultrasound guided renal core biopsy Findings:  2 G core biopsy x 2 of LP of left kidney.  Solid tissue obtained. Small amount of perinephric hemorrhage present after biopsy. Will hold heparin x 2 hours.

## 2015-02-06 LAB — CBC
HCT: 27.2 % — ABNORMAL LOW (ref 39.0–52.0)
HEMOGLOBIN: 8.5 g/dL — AB (ref 13.0–17.0)
MCH: 30.5 pg (ref 26.0–34.0)
MCHC: 31.3 g/dL (ref 30.0–36.0)
MCV: 97.5 fL (ref 78.0–100.0)
Platelets: 102 10*3/uL — ABNORMAL LOW (ref 150–400)
RBC: 2.79 MIL/uL — ABNORMAL LOW (ref 4.22–5.81)
RDW: 24.6 % — AB (ref 11.5–15.5)
WBC: 19.9 10*3/uL — ABNORMAL HIGH (ref 4.0–10.5)

## 2015-02-06 LAB — GLUCOSE, CAPILLARY
GLUCOSE-CAPILLARY: 115 mg/dL — AB (ref 65–99)
GLUCOSE-CAPILLARY: 143 mg/dL — AB (ref 65–99)
GLUCOSE-CAPILLARY: 128 mg/dL — AB (ref 65–99)
Glucose-Capillary: 117 mg/dL — ABNORMAL HIGH (ref 65–99)

## 2015-02-06 LAB — RENAL FUNCTION PANEL
ANION GAP: 10 (ref 5–15)
Albumin: 2.2 g/dL — ABNORMAL LOW (ref 3.5–5.0)
BUN: 71 mg/dL — AB (ref 6–20)
CALCIUM: 8 mg/dL — AB (ref 8.9–10.3)
CHLORIDE: 105 mmol/L (ref 101–111)
CO2: 21 mmol/L — ABNORMAL LOW (ref 22–32)
Creatinine, Ser: 5.35 mg/dL — ABNORMAL HIGH (ref 0.61–1.24)
GFR calc Af Amer: 11 mL/min — ABNORMAL LOW (ref >60)
GFR, EST NON AFRICAN AMERICAN: 10 mL/min — AB (ref >60)
GLUCOSE: 120 mg/dL — AB (ref 65–99)
PHOSPHORUS: 6.2 mg/dL — AB (ref 2.5–4.6)
POTASSIUM: 3.8 mmol/L (ref 3.5–5.1)
Sodium: 136 mmol/L (ref 135–145)

## 2015-02-06 LAB — HEPARIN LEVEL (UNFRACTIONATED)
HEPARIN UNFRACTIONATED: 0.18 [IU]/mL — AB (ref 0.30–0.70)
HEPARIN UNFRACTIONATED: 0.51 [IU]/mL (ref 0.30–0.70)
Heparin Unfractionated: 0.45 IU/mL (ref 0.30–0.70)

## 2015-02-06 LAB — PROTIME-INR
INR: 1.44 (ref 0.00–1.49)
PROTHROMBIN TIME: 17.6 s — AB (ref 11.6–15.2)

## 2015-02-06 MED ORDER — HEPARIN BOLUS VIA INFUSION
2000.0000 [IU] | Freq: Once | INTRAVENOUS | Status: AC
  Administered 2015-02-06: 2000 [IU] via INTRAVENOUS
  Filled 2015-02-06: qty 2000

## 2015-02-06 MED ORDER — WARFARIN SODIUM 7.5 MG PO TABS
7.5000 mg | ORAL_TABLET | Freq: Once | ORAL | Status: AC
  Administered 2015-02-06: 7.5 mg via ORAL
  Filled 2015-02-06: qty 1

## 2015-02-06 NOTE — Progress Notes (Signed)
Physical Therapy Addendum for G-codes    02/11/15 1104  PT G-Codes **NOT FOR INPATIENT CLASS**  Functional Assessment Tool Used clinical judgement  Functional Limitation Mobility: Walking and moving around  Mobility: Walking and Moving Around Current Status [phone]) CI  Mobility: Walking and Moving Around Goal Status [phone]) [facility]    02/06/2015 Barry Brunner, PT Pager: [phone]

## 2015-02-06 NOTE — Progress Notes (Signed)
Physical Therapy Treatment Patient Details Name: Travis [name] MRN: [id number] DOB: 11-30-1940 Today's Date: 02/06/2015    History of Present Illness Pt adm with bil LE edema and weakness. +RLE DVT and ?PE (study inconclusive). Pt with recent admissions with pneumonia. Previous workup revealed positive renal failure, and he is beign worked up for autoimmune dysfunction (?Sjogrens); pt also with steriod induced hyperglycemia, normocytic anemia.     PT Comments    Pt continues to require oxygen and multiple cues to monitor his activity tolerance/cardiopulmonary status while up active. He did maintain SaO2 99% on 2L while walking, however dyspnea 3/4. He becomes anxious and begins to rush (which then makes his dyspnea even worse). Was able to follow commands for standing rest breaks with pursed lip breathing and maintain slower, even pace at times.    Follow Up Recommendations  Supervision - Intermittent;Home health PT     Equipment Recommendations  None recommended by PT    Recommendations for Other Services       Precautions / Restrictions Precautions Precautions: Fall    Mobility  Bed Mobility Overal bed mobility: Needs Assistance Bed Mobility: Supine to Sit;Sit to Supine     Supine to sit: Supervision Sit to supine: Supervision   General bed mobility comments: no physical assist required, HOB flat, used rail  Transfers Overall transfer level: Needs assistance Equipment used: Rolling walker (2 wheeled) Transfers: Sit to/from Stand Sit to Stand: Supervision         General transfer comment: vc for safe use of RW  Ambulation/Gait Ambulation/Gait assistance: Min guard Ambulation Distance (Feet): 160 Feet Assistive device: Rolling walker (2 wheeled) Gait Pattern/deviations: Step-through pattern;Trunk flexed Gait velocity: too fast for his cardiopulmonary status   General Gait Details: Good use of RW to decr the work of walking; cues to self-monitor for activity  tolerance--pt poorly self-regulates and required cues for standing rest breaks due to incr HR and to slow velocity   Stairs            Wheelchair Mobility    Modified Rankin (Stroke Patients Only)       Balance             Standing balance-Leahy Scale: Fair                      Cognition Arousal/Alertness: Awake/alert Behavior During Therapy: WFL for tasks assessed/performed Overall Cognitive Status: Within Functional Limits for tasks assessed                      Exercises Other Exercises Other Exercises: provided handout for supine and sitting exercises pt can do between walks; Pt's dinner had arrived and reviewed verbally with wife    General Comments General comments (skin integrity, edema, etc.): wife present      Pertinent Vitals/Pain SaO2 99% on 2L with activity HR 143 with activity; with seated rest x 1 minute HR 128  Pain Assessment: 0-10 Pain Score: 10-Worst pain ever Pain Location: Lt lower quadrant Pain Intervention(s): Limited activity within patient's tolerance;Monitored during session;Repositioned;Patient requesting pain meds-RN notified    Home Living                      Prior Function            PT Goals (current goals can now be found in the care plan section) Acute Rehab PT Goals Patient Stated Goal: to go home PT Goal Formulation: With patient Time For  Goal Achievement: 02/11/15 Potential to Achieve Goals: Good Progress towards PT goals: Progressing toward goals    Frequency  Min 3X/week    PT Plan Discharge plan needs to be updated    Co-evaluation             End of Session Equipment Utilized During Treatment: Oxygen Activity Tolerance: Patient limited by fatigue Patient left: in chair;with call bell/phone within reach;with family/visitor present     Time: [id number] PT Time Calculation (min) (ACUTE ONLY): 24 min  Charges:  $Gait Training: 23-37 mins                    G Codes:       Kelly Eisler 2015-02-28, 5:05 PM Pager [phone]

## 2015-02-06 NOTE — Progress Notes (Signed)
ANTICOAGULATION CONSULT NOTE - Follow Up Consult  Pharmacy Consult for heparin Indication: PE/DVT  No Known Allergies  Patient Measurements: Height: 5\' 10"  (177.8 cm) Weight: 213 lb 9.6 oz (96.888 kg) IBW/kg (Calculated) : 73 Heparin Dosing Weight: 93kg  Vital Signs: Temp: 98.5 F (36.9 C) (05/26 2100) Temp Source: Oral (05/26 2100) BP: 161/77 mmHg (05/26 2100) Pulse Rate: 97 (05/26 2100)  Labs:  Recent Labs  02/04/15 0508  02/05/15 0514  02/06/15 0439 02/06/15 1423 02/06/15 2139  HGB 8.7*  --  9.2*  --  8.5*  --   --   HCT 27.4*  --  29.5*  --  27.2*  --   --   PLT 137*  --  122*  --  102*  --   --   LABPROT 21.5*  --  18.7*  --  17.6*  --   --   INR 1.87*  --  1.56*  --  1.44  --   --   HEPARINUNFRC 0.41  < >  --   < > 0.18* 0.51 0.45  CREATININE 4.58*  --  4.81*  --  5.35*  --   --   < > = values in this interval not displayed.  Estimated Creatinine Clearance: 14.4 mL/min (by C-G formula based on Cr of 5.35).  Assessment: 74 yo male with DVT and VQ with intermediate probability for PE s/p renal biopsy on heparin and coumadin. Hg is 8.5 and has been low but stable, plt= 102 (slow trend down noted).  Heparin level is at goal (HL= 0.45) upon recheck tonight. No changes warranted. No bleeding noted.   Goal of Therapy:  Heparin level 0.3-0.7 units/ml Monitor platelets by anticoagulation protocol: Yes   Plan:  -No heparin changes needed -Daily heparin level and CBC  Erin Hearing PharmD., BCPS Clinical Pharmacist Pager [phone] 02/06/2015 10:17 PM

## 2015-02-06 NOTE — Progress Notes (Addendum)
[name] Interim History: 74 y.o. year-old with hx of DJD and GERD, otherwise healthy until April 2016 in hospital twice with pulm infiltrates (pna vs fibrosis), cough, SOB, LE edema. Work up was ANA negative but was + for RF, antiRo and antiLa and for ANCA. He has been treated with some oral prednisone. He was ordered for home O2 by his PCP the week prior to admission. On 5/18 presented to ED with severe fatigue, LE edema. No SOB or CP. Was seen in pulm clinic and dopplers were + for DVT R leg below the knee. Admitted and started on IV heparin. Creat was up, new since last hosp stay, 2.94 on admission and up to 4.58 today. Patient received 3 days of high dose solumedrol and now on 60. Renal on board. Renal biopsy pending (once INR < 1.5)   Assessment/Plan: Acute DVT of right tibial vein with intermediate probability VQ scan: - Trace symptom-free he was started on heparin and Coumadin. - Coumadin had to be on hold for renal biopsy. Heparin to be restarted by IR after biopsy.  Acute on chronic kidney disease stage III: - Renal ultrasound was done that showed no acute abnormalities. - Renal consulted and recommended renal biopsy, performed on 5.25.2016 by IR. - Steroids were started empirically. - IV fluids KVO Cr cont to worsen. Further management per renal. - he is midly fluid overloaded, ? If due to steroids.  Elevated blood pressure without a diagnosis of hypertension: Continue hydralazine when necessary. And continue to monitor.  Steroid-induced hyperglycemia: - Start on IV Solu-Medrol for 3 days. - now on oral prednisone. -  A1c was 4.9 and continue sliding scale insulin.  Acute on chronic anemia: Continue iron and B-12 supplements. Continue Protonix. follow-up with his PCP as an outpatient.  Physical deconditioning: - Consider physical therapy.  Sjogren's disease - Follow with rheumatology as an  outpatient.   Consultants:  Renal  IR  Procedures:  Renal biopsy on 5.26.2016  Antibiotics:  None  HPI/Subjective: No complain no pain.  Objective: Filed Vitals:   02/05/15 1436 02/05/15 1513 02/05/15 2024 02/06/15 0504  BP: 139/82 151/84 168/86 147/78  Pulse: 84 83 86 92  Temp:  98 F (36.7 C) 98.4 F (36.9 C) 97.8 F (36.6 C)  TempSrc:  Oral Oral Oral  Resp: 13 17 18 18   Height:      Weight:    96.888 kg (213 lb 9.6 oz)  SpO2: 99% 98% 98% 97%    Intake/Output Summary (Last 24 hours) at 02/06/15 0823 Last data filed at 02/06/15 0506  Gross per 24 hour  Intake      0 ml  Output   1250 ml  Net  -1250 ml   Filed Weights   02/04/15 0457 02/05/15 0533 02/06/15 0504  Weight: 105.098 kg (231 lb 11.2 oz) 97.433 kg (214 lb 12.8 oz) 96.888 kg (213 lb 9.6 oz)    Exam:  General: Alert, awake, oriented x3, in no acute distress.  HEENT: No bruits, no goiter.  Heart: Regular rate and rhythm. Lungs: Good air movement, clear.  Abdomen: Soft, nontender, nondistended, positive bowel sounds.  Neuro: Grossly intact, nonfocal.   Data Reviewed: Basic Metabolic Panel:  Recent Labs Lab 01/30/15 2155  02/02/15 1150 02/03/15 0430 02/04/15 0508 02/05/15 0514 02/06/15 0439  NA  --   < > 136 137 136 136 136  K  --   < > 4.9 4.2 4.2 3.7 3.8  CL  --   < >  106 106 105 104 105  CO2  --   < > 23 24 22  20* 21*  GLUCOSE  --   < > 161* 147* 150* 120* 120*  BUN  --   < > 34* 41* 54* 64* 71*  CREATININE  --   < > 3.97* 4.12* 4.58* 4.81* 5.35*  CALCIUM  --   < > 8.2* 8.0* 8.1* 8.1* 8.0*  MG 2.0  --   --   --   --   --   --   PHOS  --   --  5.6* 5.6* 6.1* 5.6* 6.2*  < > = values in this interval not displayed. Liver Function Tests:  Recent Labs Lab 02/02/15 1150 02/03/15 0430 02/04/15 0508 02/05/15 0514 02/06/15 0439  ALBUMIN 2.0* 2.2* 2.3* 2.2* 2.2*   No results for input(s): LIPASE, AMYLASE in the last 168 hours. No results for input(s): AMMONIA in the last 168  hours. CBC:  Recent Labs Lab 02/02/15 0600 02/03/15 0430 02/04/15 0508 02/05/15 0514 02/06/15 0439  WBC 9.7 14.4* 18.2* 22.8* 19.9*  HGB 8.7* 7.8* 8.7* 9.2* 8.5*  HCT 27.8* 25.2* 27.4* 29.5* 27.2*  MCV 96.5 95.8 95.1 95.8 97.5  PLT 140* 159 137* 122* 102*   Cardiac Enzymes: No results for input(s): CKTOTAL, CKMB, CKMBINDEX, TROPONINI in the last 168 hours. BNP (last 3 results)  Recent Labs  12/17/14 1934 01/02/15 1241 01/06/15 1758  BNP 38.6 66.6 83.3    ProBNP (last 3 results) No results for input(s): PROBNP in the last 8760 hours.  CBG:  Recent Labs Lab 02/05/15 0621 02/05/15 1112 02/05/15 1615 02/05/15 2120 02/06/15 0514  GLUCAP 115* 138* 132* 110* 117*    No results found for this or any previous visit (from the past 240 hour(s)).   Studies: US Biopsy  02/05/2015   CLINICAL DATA:  Acute renal failure and glomerulonephritis. The patient requires renal biopsy.  EXAM: ULTRASOUND GUIDED CORE BIOPSY OF LEFT KIDNEY  MEDICATIONS: 1.0 mg IV Versed; 50 mcg IV Fentanyl  Total Moderate Sedation Time: 15 minutes  PROCEDURE: The procedure, risks, benefits, and alternatives were explained to the patient. Questions regarding the procedure were encouraged and answered. The patient understands and consents to the procedure. A time-out was performed prior to the procedure.  The left flank region was prepped with Betadine in a sterile fashion, and a sterile drape was applied covering the operative field. A sterile gown and sterile gloves were used for the procedure. Local anesthesia was provided with 1% Lidocaine.  Ultrasound was performed of both kidneys. The left kidney was chosen for biopsy. Under ultrasound guidance, 2 separate core biopsy samples were obtained through lower pole cortex utilizing a 16 gauge needle biopsy device. Postprocedural ultrasound was performed.  COMPLICATIONS: Small amount of perinephric hemorrhage. SIR level A: No therapy, no consequence.  FINDINGS: Both  kidneys were well visualized by ultrasound. Percutaneous window to renal cortex was slightly better on the left compared to the right. Two solid core biopsy samples were obtained. Post biopsy imaging shows a small amount of perinephric hemorrhage adjacent to the lower pole of the left kidney. This did not visibly enlarge over several minutes of sonographic surveillance.  IMPRESSION: Ultrasound-guided core biopsy performed of the lower pole of the left kidney. The procedure was complicated by a small amount of focal perinephric hemorrhage adjacent to the lower pole.   Electronically Signed   By: Aletta Edouard M.D.   On: 02/05/2015 16:15    Scheduled Meds: . cyanocobalamin  500 mcg Oral Daily  . docusate sodium  100 mg Oral BID  . ferrous sulfate  325 mg Oral BID WC  . insulin aspart  0-9 Units Subcutaneous TID WC  . pantoprazole  40 mg Oral Daily  . polyethylene glycol  17 g Oral Daily  . predniSONE  60 mg Oral Q breakfast  . sodium chloride  3 mL Intravenous Q12H  . warfarin  7.5 mg Oral ONCE-1800  . Warfarin - Pharmacist Dosing Inpatient   Does not apply q1800   Continuous Infusions: . sodium chloride Stopped (02/01/15 1719)  . heparin 1,500 Units/hr (02/06/15 0703)    Time Spent: 15 min   FELIZ Marguarite Arbour  [facility] Pager [phone]. If 7PM-7AM, please contact night-coverage at [email or web address], password [facility] 02/06/2015, 8:23 AM  LOS: 8 days

## 2015-02-06 NOTE — Progress Notes (Signed)
Occupational Therapy Treatment Patient Details Name: Travis [name] MRN: [id number] DOB: 07-18-1941 Today's Date: 02/06/2015    History of present illness Pt adm with bil LE edema and weakness. +RLE DVT and ?PE (study inconclusive). Pt with recent admissions with pneumonia. Previous workup revealed positive renal failure, and he is beign worked up for autoimmune dysfunction (?Sjogrens); pt also with steriod induced hyperglycemia, normocytic anemia.    OT comments  Focus of session on instructing pt and wife in energy conservation strategies.  Pt requiring supervision for standing toileting and grooming at sink with use of RW. Per RN, pt's wife has been routinely assisting him with OOB mobility.  Pt is eager to go home.  Follow Up Recommendations  No OT follow up;Supervision - Intermittent    Equipment Recommendations  None recommended by OT    Recommendations for Other Services      Precautions / Restrictions Precautions Precautions: Fall       Mobility Bed Mobility Overal bed mobility: Needs Assistance Bed Mobility: Supine to Sit;Sit to Supine     Supine to sit: Supervision Sit to supine: Supervision   General bed mobility comments: no physical assist required, HOB flat, used rail  Transfers   Equipment used: Rolling walker (2 wheeled) Transfers: Sit to/from Stand Sit to Stand: Supervision              Balance                                   ADL Overall ADL's : Needs assistance/impaired     Grooming: Wash/dry hands;Supervision/safety;Standing                   Toilet Transfer: Supervision/safety;RW;Ambulation Armed forces technical officer Details (indicate cue type and reason): pt walked to bathroom with walker and O2, stood to urinate Toileting- Water quality scientist and Hygiene: Supervision/safety;Sit to/from stand       Functional mobility during ADLs: Supervision/safety;Rolling walker General ADL Comments: Educated in energy conservation  and gave pt and wife handout.      Vision                     Perception     Praxis      Cognition   Behavior During Therapy: WFL for tasks assessed/performed Overall Cognitive Status: Within Functional Limits for tasks assessed                       Extremity/Trunk Assessment               Exercises     Shoulder Instructions       General Comments      Pertinent Vitals/ Pain       Pain Assessment: No/denies pain  Home Living                                          Prior Functioning/Environment              Frequency Min 2X/week     Progress Toward Goals  OT Goals(current goals can now be found in the care plan section)  Progress towards OT goals: Progressing toward goals  Acute Rehab OT Goals Patient Stated Goal: to go home Time For Goal Achievement: 02/18/15 Potential to Achieve Goals: Good  Plan Discharge plan remains  appropriate    Co-evaluation                 End of Session Equipment Utilized During Treatment: Rolling walker;Oxygen (2L)   Activity Tolerance Patient limited by fatigue   Patient Left in bed;with call bell/phone within reach;with family/visitor present   Nurse Communication          Time: 1549-1610 OT Time Calculation (min): 21 min  Charges: OT General Charges $OT Visit: 1 Procedure OT Treatments $Self Care/Home Management : 8-22 mins  [name] 02/06/2015, 4:27 PM  [phone]

## 2015-02-06 NOTE — Progress Notes (Signed)
ANTICOAGULATION CONSULT NOTE - Follow Up Consult  Pharmacy Consult for heparin Indication: PE/DVT  No Known Allergies  Patient Measurements: Height: 5\' 10"  (177.8 cm) Weight: 213 lb 9.6 oz (96.888 kg) IBW/kg (Calculated) : 73 Heparin Dosing Weight: 93kg  Vital Signs: Temp: 97.8 F (36.6 C) (05/26 0504) Temp Source: Oral (05/26 0504) BP: 147/78 mmHg (05/26 0504) Pulse Rate: 92 (05/26 0504)  Labs:  Recent Labs  02/04/15 0508  02/05/15 0514 02/05/15 1036 02/06/15 0439 02/06/15 1423  HGB 8.7*  --  9.2*  --  8.5*  --   HCT 27.4*  --  29.5*  --  27.2*  --   PLT 137*  --  122*  --  102*  --   LABPROT 21.5*  --  18.7*  --  17.6*  --   INR 1.87*  --  1.56*  --  1.44  --   HEPARINUNFRC 0.41  < >  --  0.59 0.18* 0.51  CREATININE 4.58*  --  4.81*  --  5.35*  --   < > = values in this interval not displayed.  Estimated Creatinine Clearance: 14.4 mL/min (by C-G formula based on Cr of 5.35).   Medications:  Scheduled:  . cyanocobalamin  500 mcg Oral Daily  . docusate sodium  100 mg Oral BID  . ferrous sulfate  325 mg Oral BID WC  . insulin aspart  0-9 Units Subcutaneous TID WC  . pantoprazole  40 mg Oral Daily  . polyethylene glycol  17 g Oral Daily  . predniSONE  60 mg Oral Q breakfast  . sodium chloride  3 mL Intravenous Q12H  . warfarin  7.5 mg Oral ONCE-1800  . Warfarin - Pharmacist Dosing Inpatient   Does not apply q1800    Assessment: 74 yo male with DVT and VQ with intermediate probability for PE s/p renal biopsy on heparin and coumadin. Hg is 8.5 and has been low but stable, plt= 102 (slow trend down noted). Heparin level is at goal (HL= 0.51) after heparin bolus and increase to 1500/hr.    Goal of Therapy:  Heparin level 0.3-0.7 units/ml Monitor platelets by anticoagulation protocol: Yes   Plan:  -No heparin changes needed -Will confirm a heparin level today -Daily heparin level and CBC  Hildred Laser, Pharm D 02/06/2015 3:12 PM

## 2015-02-06 NOTE — Progress Notes (Signed)
ANTICOAGULATION CONSULT NOTE - Follow Up Consult  Pharmacy Consult for heparin Indication: PE/DVT  No Known Allergies  Patient Measurements: Height: 5\' 10"  (177.8 cm) Weight: 213 lb 9.6 oz (96.888 kg) IBW/kg (Calculated) : 73 Heparin Dosing Weight: 93kg  Vital Signs: Temp: 97.8 F (36.6 C) (05/26 0504) Temp Source: Oral (05/26 0504) BP: 147/78 mmHg (05/26 0504) Pulse Rate: 92 (05/26 0504)  Labs:  Recent Labs  02/04/15 0508 02/04/15 1257 02/05/15 0514 02/05/15 1036 02/06/15 0439  HGB 8.7*  --  9.2*  --  8.5*  HCT 27.4*  --  29.5*  --  27.2*  PLT 137*  --  122*  --  102*  LABPROT 21.5*  --  18.7*  --  17.6*  INR 1.87*  --  1.56*  --  1.44  HEPARINUNFRC 0.41 0.57  --  0.59 0.18*  CREATININE 4.58*  --  4.81*  --  5.35*    Estimated Creatinine Clearance: 14.4 mL/min (by C-G formula based on Cr of 5.35).   Medications:  Scheduled:  . cyanocobalamin  500 mcg Oral Daily  . docusate sodium  100 mg Oral BID  . ferrous sulfate  325 mg Oral BID WC  . insulin aspart  0-9 Units Subcutaneous TID WC  . pantoprazole  40 mg Oral Daily  . polyethylene glycol  17 g Oral Daily  . predniSONE  60 mg Oral Q breakfast  . sodium chloride  3 mL Intravenous Q12H  . Warfarin - Pharmacist Dosing Inpatient   Does not apply q1800    Assessment: 74 yo male with DVT and VQ with intermediate probability for PE. He was on lovenox/coumadin but this in now on hold with plans for biopsy which has been delayed. Last lovenox dose was 5/21 and last coumadin dose was 5/21. INR is 1.56. Hg is 9.2 and has been low (range 7.7- 8.7 in the last few days), plt= 122. Plans are now anticoagulation with heparin.  -He is now s/p biopsy. Discussed with Dr. Linard Millers and to restart heparin 4 hours post biopsy. Ok to resume coumadin tonight. Last heparin rate was 1650 units/hr and heparin was at goal. With recent biopsy will start heparin at a lower dose.   AM HL is SUBtherapeutic on heparin 1200 units/hr. Nurse  reports no issues with infusion or bleeding.  Goal of Therapy:  Heparin level 0.3-0.7 units/ml Monitor platelets by anticoagulation protocol: Yes   Plan:  Bolus 2000 units and increase heparin to 1500 units/hr 8h HL Heparin level  daily wth CBC daily  Andrey Cota. Diona Foley, PharmD Clinical Pharmacist Pager [phone] 02/06/2015 6:12 AM

## 2015-02-06 NOTE — Progress Notes (Signed)
[facility] ROUNDING NOTE   Subjective:   Interval History: comfortable today but appears weak  Objective:  Vital signs in last 24 hours:  Temp:  [97.8 F (36.6 C)-98.4 F (36.9 C)] 97.8 F (36.6 C) (05/26 0504) Pulse Rate:  [83-93] 92 (05/26 0504) Resp:  [8-26] 18 (05/26 0504) BP: (139-168)/(77-86) 147/78 mmHg (05/26 0504) SpO2:  [97 %-99 %] 97 % (05/26 0504) Weight:  [96.888 kg (213 lb 9.6 oz)] 96.888 kg (213 lb 9.6 oz) (05/26 0504)  Weight change: -0.544 kg (-1 lb 3.2 oz) Filed Weights   02/04/15 0457 02/05/15 0533 02/06/15 0504  Weight: 105.098 kg (231 lb 11.2 oz) 97.433 kg (214 lb 12.8 oz) 96.888 kg (213 lb 9.6 oz)    Intake/Output: I/O last 3 completed shifts: In: -  Out: 1850 [Urine:1850]   Intake/Output this shift:  Total I/O In: 236 [P.O.:236] Out: 200 [Urine:200]  CVS- RRR  JVP mild increased RS-  Fine end inspiratory rales ABD- BS present soft non-distended EXT- 2 + edema   Basic Metabolic Panel:  Recent Labs Lab 01/30/15 2155  02/02/15 1150 02/03/15 0430 02/04/15 0508 02/05/15 0514 02/06/15 0439  NA  --   < > 136 137 136 136 136  K  --   < > 4.9 4.2 4.2 3.7 3.8  CL  --   < > 106 106 105 104 105  CO2  --   < > 23 24 22  20* 21*  GLUCOSE  --   < > 161* 147* 150* 120* 120*  BUN  --   < > 34* 41* 54* 64* 71*  CREATININE  --   < > 3.97* 4.12* 4.58* 4.81* 5.35*  CALCIUM  --   < > 8.2* 8.0* 8.1* 8.1* 8.0*  MG 2.0  --   --   --   --   --   --   PHOS  --   --  5.6* 5.6* 6.1* 5.6* 6.2*  < > = values in this interval not displayed.  Liver Function Tests:  Recent Labs Lab 02/02/15 1150 02/03/15 0430 02/04/15 0508 02/05/15 0514 02/06/15 0439  ALBUMIN 2.0* 2.2* 2.3* 2.2* 2.2*   No results for input(s): LIPASE, AMYLASE in the last 168 hours. No results for input(s): AMMONIA in the last 168 hours.  CBC:  Recent Labs Lab 02/02/15 0600 02/03/15 0430 02/04/15 0508 02/05/15 0514 02/06/15 0439  WBC 9.7 14.4* 18.2* 22.8* 19.9*   HGB 8.7* 7.8* 8.7* 9.2* 8.5*  HCT 27.8* 25.2* 27.4* 29.5* 27.2*  MCV 96.5 95.8 95.1 95.8 97.5  PLT 140* 159 137* 122* 102*    Cardiac Enzymes: No results for input(s): CKTOTAL, CKMB, CKMBINDEX, TROPONINI in the last 168 hours.  BNP: Invalid input(s): POCBNP  CBG:  Recent Labs Lab 02/05/15 0621 02/05/15 1112 02/05/15 1615 02/05/15 2120 02/06/15 0514  GLUCAP 115* 138* 132* 110* 117*    Microbiology: Results for orders placed or performed during the hospital encounter of 01/06/15  Blood Culture (routine x 2)     Status: None   Collection Time: 01/06/15  6:25 PM  Result Value Ref Range Status   Specimen Description BLOOD LEFT ARM  Final   Special Requests BOTTLES DRAWN AEROBIC AND ANAEROBIC 5CC  Final   Culture   Final    NO GROWTH 5 DAYS Performed at [facility]    Report Status 01/13/2015 FINAL  Final  Blood Culture (routine x 2)     Status: None   Collection Time: 01/06/15  6:40 PM  Result Value Ref Range Status   Specimen Description BLOOD LEFT HAND  Final   Special Requests BOTTLES DRAWN AEROBIC AND ANAEROBIC 5CC  Final   Culture   Final    NO GROWTH 5 DAYS Performed at [facility]    Report Status 01/13/2015 FINAL  Final  Respiratory virus antigens panel     Status: None   Collection Time: 01/06/15  6:40 PM  Result Value Ref Range Status   Source - RVPAN NASAL SWAB  Corrected   Respiratory Syncytial Virus A Negative Negative Final   Respiratory Syncytial Virus B Negative Negative Final   Influenza A Negative Negative Final   Influenza B Negative Negative Final   Parainfluenza 1 Negative Negative Final   Parainfluenza 2 Negative Negative Final   Parainfluenza 3 Negative Negative Final   Metapneumovirus Negative Negative Final   Rhinovirus Negative Negative Final   Adenovirus Negative Negative Final    Comment: (NOTE) Performed At: [facility] [place], [place] [id number] Lindon Romp MD [id number]    Urine culture     Status: None   Collection Time: 01/06/15  6:42 PM  Result Value Ref Range Status   Specimen Description URINE, CLEAN CATCH  Final   Special Requests NONE  Final   Colony Count   Final    25,000 COLONIES/ML Performed at [facility]    Culture   Final    Multiple bacterial morphotypes present, none predominant. Suggest appropriate recollection if clinically indicated. Performed at [facility]    Report Status 01/07/2015 FINAL  Final  Culture, expectorated sputum-assessment     Status: None   Collection Time: 01/07/15  9:55 AM  Result Value Ref Range Status   Specimen Description SPUTUM  Final   Special Requests NONE  Final   Sputum evaluation   Final    MICROSCOPIC FINDINGS SUGGEST THAT THIS SPECIMEN IS NOT REPRESENTATIVE OF LOWER RESPIRATORY SECRETIONS. PLEASE RECOLLECT. Gram Stain Report Called to,Read Back By and Verified With: Vale Haven AT 1336 01/07/15 BY K BARR    Report Status 01/07/2015 FINAL  Final    Coagulation Studies:  Recent Labs  02/04/15 0508 02/05/15 0514 02/06/15 0439  LABPROT 21.5* 18.7* 17.6*  INR 1.87* 1.56* 1.44    Urinalysis: No results for input(s): COLORURINE, LABSPEC, PHURINE, GLUCOSEU, HGBUR, BILIRUBINUR, KETONESUR, PROTEINUR, UROBILINOGEN, NITRITE, LEUKOCYTESUR in the last 72 hours.  Invalid input(s): APPERANCEUR    Imaging: US Biopsy  02/05/2015   CLINICAL DATA:  Acute renal failure and glomerulonephritis. The patient requires renal biopsy.  EXAM: ULTRASOUND GUIDED CORE BIOPSY OF LEFT KIDNEY  MEDICATIONS: 1.0 mg IV Versed; 50 mcg IV Fentanyl  Total Moderate Sedation Time: 15 minutes  PROCEDURE: The procedure, risks, benefits, and alternatives were explained to the patient. Questions regarding the procedure were encouraged and answered. The patient understands and consents to the procedure. A time-out was performed prior to the procedure.  The left flank region was prepped with Betadine in a sterile  fashion, and a sterile drape was applied covering the operative field. A sterile gown and sterile gloves were used for the procedure. Local anesthesia was provided with 1% Lidocaine.  Ultrasound was performed of both kidneys. The left kidney was chosen for biopsy. Under ultrasound guidance, 2 separate core biopsy samples were obtained through lower pole cortex utilizing a 16 gauge needle biopsy device. Postprocedural ultrasound was performed.  COMPLICATIONS: Small amount of perinephric hemorrhage. SIR level A: No therapy, no consequence.  FINDINGS: Both kidneys were well visualized by ultrasound. Percutaneous window to renal cortex was slightly better on the left compared to the right. Two solid core biopsy samples were obtained. Post biopsy imaging shows a small amount of perinephric hemorrhage adjacent to the lower pole of the left kidney. This did not visibly enlarge over several minutes of sonographic surveillance.  IMPRESSION: Ultrasound-guided core biopsy performed of the lower pole of the left kidney. The procedure was complicated by a small amount of focal perinephric hemorrhage adjacent to the lower pole.   Electronically Signed   By: Aletta Edouard M.D.   On: 02/05/2015 16:15     Medications:   . sodium chloride Stopped (02/01/15 1719)  . heparin 1,500 Units/hr (02/06/15 0703)   . cyanocobalamin  500 mcg Oral Daily  . docusate sodium  100 mg Oral BID  . ferrous sulfate  325 mg Oral BID WC  . insulin aspart  0-9 Units Subcutaneous TID WC  . pantoprazole  40 mg Oral Daily  . polyethylene glycol  17 g Oral Daily  . predniSONE  60 mg Oral Q breakfast  . sodium chloride  3 mL Intravenous Q12H  . warfarin  7.5 mg Oral ONCE-1800  . Warfarin - Pharmacist Dosing Inpatient   Does not apply q1800   acetaminophen **OR** acetaminophen, alum & mag hydroxide-simeth, hydrALAZINE  Assessment/ Plan:  74 y.o. year-old with hx of DJD and GERD, otherwise healthy until April 2016 in hospital twice with  pulm infiltrates (pna vs fibrosis), cough, SOB, LE edema. Work up was ANA negative but was + for RF, antiRo and antiLa and for ANCA. He has been treated with some oral prednisone. He was ordered for home O2 by his PCP the week prior to admission. On 5/18 presented to ED with severe fatigue, LE edema. No SOB or CP. Was seen in pulm clinic and dopplers were + for DVT R leg below the knee. Admitted and started on IV heparin. Creat was up, new since last hosp stay, 2.94 on admission and up to 4.58 today. Patient received 3 days of high dose solumedrol and now on 60. Renal biopsy 5/25  Results pending serology ANCA and AntiGBM negative  1. 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 Volume excess/ LE edema - holding fluids, consider lasix cautiously if/ when creat stabilizes.   LOS: 8 Travis [name] W @TODAY @10 :27 AM

## 2015-02-07 ENCOUNTER — Inpatient Hospital Stay (HOSPITAL_COMMUNITY): Payer: Medicare Other

## 2015-02-07 ENCOUNTER — Encounter (HOSPITAL_COMMUNITY): Payer: Self-pay | Admitting: Radiology

## 2015-02-07 DIAGNOSIS — I2699 Other pulmonary embolism without acute cor pulmonale: Secondary | ICD-10-CM

## 2015-02-07 DIAGNOSIS — R609 Edema, unspecified: Secondary | ICD-10-CM

## 2015-02-07 DIAGNOSIS — S37012S Minor contusion of left kidney, sequela: Secondary | ICD-10-CM

## 2015-02-07 LAB — PROTIME-INR
INR: 1.73 — ABNORMAL HIGH (ref 0.00–1.49)
PROTHROMBIN TIME: 20.2 s — AB (ref 11.6–15.2)

## 2015-02-07 LAB — CBC
HEMATOCRIT: 21.7 % — AB (ref 39.0–52.0)
HEMOGLOBIN: 6.7 g/dL — AB (ref 13.0–17.0)
MCH: 30.3 pg (ref 26.0–34.0)
MCHC: 30.9 g/dL (ref 30.0–36.0)
MCV: 98.2 fL (ref 78.0–100.0)
PLATELETS: 75 10*3/uL — AB (ref 150–400)
RBC: 2.21 MIL/uL — ABNORMAL LOW (ref 4.22–5.81)
RDW: 26.7 % — ABNORMAL HIGH (ref 11.5–15.5)
WBC: 24.6 10*3/uL — AB (ref 4.0–10.5)

## 2015-02-07 LAB — RENAL FUNCTION PANEL
Albumin: 2.2 g/dL — ABNORMAL LOW (ref 3.5–5.0)
Anion gap: 9 (ref 5–15)
BUN: 77 mg/dL — ABNORMAL HIGH (ref 6–20)
CALCIUM: 7.9 mg/dL — AB (ref 8.9–10.3)
CO2: 20 mmol/L — ABNORMAL LOW (ref 22–32)
CREATININE: 5.82 mg/dL — AB (ref 0.61–1.24)
Chloride: 105 mmol/L (ref 101–111)
GFR calc Af Amer: 10 mL/min — ABNORMAL LOW (ref >60)
GFR calc non Af Amer: 9 mL/min — ABNORMAL LOW (ref >60)
GLUCOSE: 137 mg/dL — AB (ref 65–99)
POTASSIUM: 4 mmol/L (ref 3.5–5.1)
Phosphorus: 6.5 mg/dL — ABNORMAL HIGH (ref 2.5–4.6)
Sodium: 134 mmol/L — ABNORMAL LOW (ref 135–145)

## 2015-02-07 LAB — GLUCOSE, CAPILLARY
GLUCOSE-CAPILLARY: 142 mg/dL — AB (ref 65–99)
GLUCOSE-CAPILLARY: 130 mg/dL — AB (ref 65–99)
Glucose-Capillary: 122 mg/dL — ABNORMAL HIGH (ref 65–99)
Glucose-Capillary: 135 mg/dL — ABNORMAL HIGH (ref 65–99)

## 2015-02-07 LAB — HEPARIN LEVEL (UNFRACTIONATED): Heparin Unfractionated: 0.44 IU/mL (ref 0.30–0.70)

## 2015-02-07 LAB — PREPARE RBC (CROSSMATCH)

## 2015-02-07 LAB — ABO/RH: ABO/RH(D): A POS

## 2015-02-07 MED ORDER — MORPHINE SULFATE 2 MG/ML IJ SOLN
2.0000 mg | INTRAMUSCULAR | Status: DC | PRN
  Administered 2015-02-07: 2 mg via INTRAVENOUS
  Filled 2015-02-07: qty 1

## 2015-02-07 MED ORDER — FENTANYL CITRATE (PF) 100 MCG/2ML IJ SOLN
INTRAMUSCULAR | Status: AC
  Filled 2015-02-07: qty 2

## 2015-02-07 MED ORDER — NEPRO/CARBSTEADY PO LIQD
237.0000 mL | Freq: Two times a day (BID) | ORAL | Status: DC
  Administered 2015-02-07 – 2015-02-18 (×13): 237 mL via ORAL
  Filled 2015-02-07 (×28): qty 237

## 2015-02-07 MED ORDER — MIDAZOLAM HCL 2 MG/2ML IJ SOLN
INTRAMUSCULAR | Status: AC | PRN
  Administered 2015-02-07: 1 mg via INTRAVENOUS

## 2015-02-07 MED ORDER — PREDNISONE 10 MG PO TABS
10.0000 mg | ORAL_TABLET | Freq: Every day | ORAL | Status: AC
  Administered 2015-02-12 – 2015-02-13 (×2): 10 mg via ORAL
  Filled 2015-02-07 (×2): qty 1

## 2015-02-07 MED ORDER — PREDNISONE 20 MG PO TABS
30.0000 mg | ORAL_TABLET | Freq: Every day | ORAL | Status: AC
  Administered 2015-02-08 – 2015-02-09 (×2): 30 mg via ORAL
  Filled 2015-02-07 (×2): qty 1

## 2015-02-07 MED ORDER — SODIUM CHLORIDE 0.9 % IV SOLN: Freq: Once | INTRAVENOUS | Status: AC

## 2015-02-07 MED ORDER — MIDAZOLAM HCL 2 MG/2ML IJ SOLN
INTRAMUSCULAR | Status: AC
Start: 2015-02-07 — End: 2015-02-08
  Filled 2015-02-07: qty 2

## 2015-02-07 MED ORDER — PREDNISONE 20 MG PO TABS
20.0000 mg | ORAL_TABLET | Freq: Every day | ORAL | Status: AC
Start: 2015-02-10 — End: 2015-02-11
  Administered 2015-02-10 – 2015-02-11 (×2): 20 mg via ORAL
  Filled 2015-02-07 (×2): qty 1

## 2015-02-07 MED ORDER — SODIUM CHLORIDE 0.9 % IV SOLN
INTRAVENOUS | Status: AC | PRN
  Administered 2015-02-07: 10 mL/h via INTRAVENOUS

## 2015-02-07 MED ORDER — FENTANYL CITRATE (PF) 100 MCG/2ML IJ SOLN
INTRAMUSCULAR | Status: AC | PRN
  Administered 2015-02-07: 25 ug via INTRAVENOUS

## 2015-02-07 MED ORDER — LIDOCAINE HCL 1 % IJ SOLN
INTRAMUSCULAR | Status: AC
  Filled 2015-02-07: qty 20

## 2015-02-07 MED ORDER — FUROSEMIDE 10 MG/ML IJ SOLN
40.0000 mg | Freq: Once | INTRAMUSCULAR | Status: AC
  Administered 2015-02-07: 40 mg via INTRAVENOUS
  Filled 2015-02-07: qty 4

## 2015-02-07 NOTE — Progress Notes (Signed)
Occupational Therapy Treatment Patient Details Name: Travis [name] MRN: [id number] DOB: 04-08-1941 Today's Date: 02/07/2015    History of present illness Pt adm with bil LE edema and weakness. +RLE DVT and ?PE (study inconclusive). Pt with recent admissions with pneumonia. Previous workup revealed positive renal failure, and he is beign worked up for autoimmune dysfunction (?Sjogrens); pt also with steriod induced hyperglycemia, normocytic anemia.    OT comments  Progressing well with acute stated goals.  Wife present and very active in pts. Care.  Bathing him upon arrival to room.  Encouraged his assistance but she states she will be bathing him at home also.   Reviewed and educated both on energy conservation strategies and tech.  Both verbalized understanding.  Will continue to follow acutely.    Follow Up Recommendations  No OT follow up;Supervision - Intermittent    Equipment Recommendations  None recommended by OT    Recommendations for Other Services      Precautions / Restrictions Precautions Precautions: Fall Precaution Comments: monitor O2 sats and HR       Mobility Bed Mobility                  Transfers Overall transfer level: Needs assistance Equipment used: 1 person hand held assist Transfers: Sit to/from Stand;Stand Pivot Transfers Sit to Stand: Min guard Stand pivot transfers: Min guard       General transfer comment: cues for tubing management and assistance with IV pole    Balance                                   ADL Overall ADL's : Needs assistance/impaired     Grooming: Wash/dry hands;Wash/dry face;Oral care;Minimal assistance;Sitting   Upper Body Bathing: Minimal assitance;Sitting Upper Body Bathing Details (indicate cue type and reason): wife present, very active in session providing most of his care states she will be doing this also at home Lower Body Bathing: Moderate assistance;Maximal assistance;Sit to/from  stand Lower Body Bathing Details (indicate cue type and reason): wife present, very active in session providing most of his care states she will be doing this also at home Upper Body Dressing : Minimal assistance;Sitting       Toilet Transfer: Minimal assistance;Stand-pivot   Toileting- Clothing Manipulation and Hygiene: Supervision/safety;Sit to/from stand       Functional mobility during ADLs: Supervision/safety General ADL Comments: Educated in energy conservation and reviewed with pt. and wife.  pt. completed all mobility with furniture walking today with cues for o2 tubing management      Vision                     Perception     Praxis      Cognition   Behavior During Therapy: WFL for tasks assessed/performed Overall Cognitive Status: Within Functional Limits for tasks assessed                       Extremity/Trunk Assessment               Exercises     Shoulder Instructions       General Comments      Pertinent Vitals/ Pain       Pain Assessment: 0-10 Pain Score: 6  Pain Location: Lt. lower quadtrant Pain Descriptors / Indicators: Aching Pain Intervention(s): Limited activity within patient's tolerance;Monitored during session;Repositioned;Patient requesting pain meds-RN notified  Home  Living                                          Prior Functioning/Environment              Frequency Min 2X/week     Progress Toward Goals  OT Goals(current goals can now be found in the care plan section)  Progress towards OT goals: Progressing toward goals     Plan Discharge plan remains appropriate    Co-evaluation                 End of Session Equipment Utilized During Treatment: Oxygen   Activity Tolerance Patient tolerated treatment well   Patient Left in chair;with call bell/phone within reach;with family/visitor present   Nurse Communication Other (comment) (Lt. quadrant pain)        Time:  0935-1000 OT Time Calculation (min): 25 min  Charges: OT General Charges $OT Visit: 1 Procedure OT Treatments $Self Care/Home Management : 23-37 mins  Janice Coffin, COTA/L 02/07/2015, 10:04 AM

## 2015-02-07 NOTE — Progress Notes (Signed)
UR Completed. Vandana Haman, RN, BSN.  [phone] 

## 2015-02-07 NOTE — H&P (Signed)
Reason for Consult: RLE acute DVT, active bleeding  Chief Complaint: Chief Complaint  Patient presents with  . DVT   Referring Physician(s): TR[facility]  History of Present Illness: Travis [name] is a 74 y.o. male who was admitted 5/18 after being seen in Pulmonary office. He was c/o b/l LE swelling and pain and shortness of breath. LE venous dopplers as outpatient 5/18 at Dr. Chase Caller office showed acute RLE DVT in peroneal, mid and distal posterior tibial veins-admitted and placed on IV heparin and coumadin. VQ scan with intermediate probability. He was also found to have AKI s/p renal biopsy 5/25 and now with anemia-blood loss. CT today revealed left perinephric hemorrhage. IR received request for Retrievable IVC filter placement. He denies any active chest pain, shortness of breath or palpitations. He denies any lightheadedness or dizziness. He has previously tolerated sedation without complications. He c/o LLQ and left flank pain today.   Past Medical History  Diagnosis Date  . Medical history non-contributory   . Arthritis   . Pneumonia   . GERD (gastroesophageal reflux disease)   . Sjogren's disease 01/28/2015  . Pulmonary fibrosis     Past Surgical History  Procedure Laterality Date  . Hemorroidectomy  1999  . Surgical procedure to remove a mole as a child Right Eye area    At around 47 years old    Allergies: Review of patient's allergies indicates no known allergies.  Medications: Prior to Admission medications   Medication Sig Start Date End Date Taking? Authorizing Provider  cyanocobalamin 500 MCG tablet Take 1 tablet (500 mcg total) by mouth daily. 12/26/14  Yes Donne Hazel, MD  feeding supplement, ENSURE ENLIVE, (ENSURE ENLIVE) LIQD Take 237 mLs by mouth 2 (two) times daily between meals. 01/09/15  Yes Geradine Girt, DO  ferrous sulfate 325 (65 FE) MG tablet Take 1 tablet (325 mg total) by mouth 2 (two) times daily with a meal. 12/26/14  Yes Donne Hazel, MD    furosemide (LASIX) 40 MG tablet Take 1 tablet (40 mg total) by mouth daily. 01/28/15  Yes Brand Males, MD  loratadine (CLARITIN) 10 MG tablet Take 10 mg by mouth daily.   Yes Historical Provider, MD  nystatin (MYCOSTATIN) 100000 UNIT/ML suspension Take 5 mLs (500,000 Units total) by mouth 4 (four) times daily. For 7 days 01/09/15  Yes Geradine Girt, DO  potassium chloride SA (KLOR-CON M20) 20 MEQ tablet Take 1 tablet (20 mEq total) by mouth daily. 01/28/15  Yes Brand Males, MD  predniSONE (DELTASONE) 10 MG tablet Take 1 tablet (10 mg total) by mouth daily with breakfast. Patient taking differently: Take 5 mg by mouth daily with breakfast.  01/09/15  Yes Geradine Girt, DO  traMADol-acetaminophen (ULTRACET) 37.5-325 MG per tablet Take 1-2 tablets by mouth every 4 (four) hours as needed for severe pain. 12/26/14  Yes Donne Hazel, MD  enoxaparin (LOVENOX) 100 MG/ML injection Inject 1 mL (100 mg total) into the skin daily. 01/31/15   Barton Dubois, MD  pantoprazole (PROTONIX) 40 MG tablet Take 1 tablet (40 mg total) by mouth daily. 01/31/15   Barton Dubois, MD     Family History  Problem Relation Age of Onset  . Hypertension Mother     History   Social History  . Marital Status: Married    Spouse Name: N/A  . Number of Children: N/A  . Years of Education: N/A   Social History Main Topics  . Smoking status: Never Smoker   .  Smokeless tobacco: Never Used  . Alcohol Use: No  . Drug Use: No  . Sexual Activity: Not on file   Other Topics Concern  . None   Social History Narrative   Review of Systems: A 12 point ROS discussed and pertinent positives are indicated in the HPI above.  All other systems are negative.  Review of Systems  Vital Signs: BP 158/77 mmHg  Pulse 93  Temp(Src) 97.8 F (36.6 C) (Oral)  Resp 18  Ht 5\' 10"  (1.778 m)  Wt 211 lb 4.8 oz (95.845 kg)  BMI 30.32 kg/m2  SpO2 100%  Physical Exam  Constitutional: He is oriented to person, place, and time.  No distress.  HENT:  Head: Normocephalic and atraumatic.  Neck: No tracheal deviation present.  Cardiovascular: Normal rate and regular rhythm.  Exam reveals no gallop and no friction rub.   No murmur heard. Pulmonary/Chest: Effort normal and breath sounds normal. No respiratory distress. He has no wheezes. He has no rales.  Abdominal: Soft. Bowel sounds are normal. There is tenderness.  Left flank TTP, biopsy flank site with C/D/I band-aid   Musculoskeletal: He exhibits edema.  3+ pitting edema B/L  Neurological: He is alert and oriented to person, place, and time.  Skin: Skin is warm and dry. He is not diaphoretic.    Mallampati Score:  MD Evaluation Airway: WNL Heart: WNL Abdomen: WNL Chest/ Lungs: WNL ASA  Classification: 3 Mallampati/Airway Score: Two  Imaging: Ct Abdomen Pelvis Wo Contrast  02/07/2015   CLINICAL DATA:  Left-sided abdominal pain. Left renal biopsy yesterday. Increased pain today with low hemoglobin.  EXAM: CT ABDOMEN AND PELVIS WITHOUT CONTRAST  TECHNIQUE: Multidetector CT imaging of the abdomen and pelvis was performed following the standard protocol without IV contrast.  COMPARISON:  Chest CT 12/19/2014 and chest CT 01/02/2015  FINDINGS: Lung bases demonstrate small to moderate bilateral pleural effusions which are worse with associated bibasilar airspace opacification without significant change which may be due to pneumonia versus atelectasis. Small amount of pericardial fluid unchanged.  Abdominal images demonstrate the left kidney to be normal in size without hydronephrosis, nephrolithiasis or focal mass. There is moderate mixed attenuation left perinephric hemorrhage from patient's recent percutaneous biopsy. Hemorrhage is worse over the posterior perinephric space which extends inferiorly below the kidney abutting the psoas muscle extending to the level of the left pelvis just above the inguinal region. This hemorrhage displaces the kidney slightly anteriorly.   The liver, spleen, pancreas, adrenal glands and right kidney are within normal. There is mild layering material within the gallbladder which may represent sludge versus non calcified stones. Appendix is normal. There is diverticulosis of the colon.  Pelvic images demonstrate the bladder, prostate and rectum to be within normal. There is fluid in the presacral space. There is spondylosis of the lumbar spine with disc disease at the L4-5 level.  IMPRESSION: Evidence of moderate left perinephric hemorrhage post percutaneous biopsy yesterday. Hemorrhage is worse over the posterior perinephric space with mild displacement of the kidney anteriorly. Hemorrhage extends inferiorly to the level of the left pelvis just above the inguinal region. No hydronephrosis.  Worsening moderate size bilateral pleural effusions with associated airspace disease in the lower lungs likely pneumonia and less likely atelectasis.  Small amount of pericardial fluid unchanged.  Sludge versus noncalcified gallstones.  Mild diverticulosis of the colon.   Electronically Signed   By: Marin Olp M.D.   On: 02/07/2015 12:12   Dg Chest 2 View  01/29/2015  CLINICAL DATA:  Pneumonia, shortness of breath.  EXAM: CHEST  2 VIEW  COMPARISON:  Jan 28, 2015.  FINDINGS: Stable cardiomediastinal silhouette. No pneumothorax is noted. Stable bibasilar opacities are noted consistent with pneumonia with probable minimal associated pleural effusions. Bony thorax appears intact.  IMPRESSION: Stable bibasilar opacities are noted most consistent with pneumonia. Continued radiographic follow-up is recommended to ensure resolution.   Electronically Signed   By: Marijo Conception, M.D.   On: 01/29/2015 17:51   Dg Chest 2 View  01/28/2015   CLINICAL DATA:  Pneumonia, followup  EXAM: CHEST  2 VIEW  COMPARISON:  Chest x-ray of 01/06/2015  FINDINGS: There has been worsening of parenchymal opacities in both lower lobes consistent with worsening of bilateral lower lobe  pneumonia. A small right pleural effusion cannot be excluded. Mediastinal and hilar contours are unchanged. The heart is mildly enlarged and stable. No acute bony abnormality is seen.  IMPRESSION: Worsening of lower lobe opacities consistent with pneumonia.   Electronically Signed   By: Ivar Drape M.D.   On: 01/28/2015 14:47   US Renal  01/30/2015   CLINICAL DATA:  Acute renal failure.  Sjogren's disease.  EXAM: RENAL / URINARY TRACT ULTRASOUND COMPLETE  COMPARISON:  None.  FINDINGS: Right Kidney:  Length: 12.5 cm. Echogenicity within normal limits. No mass or hydronephrosis visualized.  Left Kidney:  Length: 13.7 cm. Echogenicity within normal limits. No mass or hydronephrosis visualized.  Bladder:  Bladder is nondistended. Mild bladder wall prominence is most likely secondary to nondistended state.  IMPRESSION: Normal exam.   Electronically Signed   By: Marcello Moores  Register   On: 01/30/2015 07:40   Nm Pulmonary Perf And Vent  01/29/2015   CLINICAL DATA:  Shortness of breath for 2 weeks. Known deep venous thrombosis .  EXAM: NUCLEAR MEDICINE VENTILATION - PERFUSION LUNG SCAN  TECHNIQUE: Ventilation images were obtained in multiple projections using inhaled aerosol Tc-15m DTPA. Perfusion images were obtained in multiple projections after intravenous injection of Tc-68m MAA.  RADIOPHARMACEUTICALS:  40.0 Technetium-72m DTPA aerosol inhalation and 6.0 Technetium-45m MAA IV  COMPARISON:  Chest radiograph same date, which demonstrate left greater than right bibasilar airspace disease.  FINDINGS: Ventilation: Vague decreased ventilation involving the left greater than right lung bases, corresponding to airspace disease on chest radiograph.  Perfusion: Vague decreased perfusion within the same areas of the lung bases, greater on the left.  IMPRESSION: Triple matched defects within the lung bases, greater on the left than right. Intermediate probability for pulmonary embolism.   Electronically Signed   By: Abigail Miyamoto  M.D.   On: 01/29/2015 17:37   US Biopsy  02/05/2015   CLINICAL DATA:  Acute renal failure and glomerulonephritis. The patient requires renal biopsy.  EXAM: ULTRASOUND GUIDED CORE BIOPSY OF LEFT KIDNEY  MEDICATIONS: 1.0 mg IV Versed; 50 mcg IV Fentanyl  Total Moderate Sedation Time: 15 minutes  PROCEDURE: The procedure, risks, benefits, and alternatives were explained to the patient. Questions regarding the procedure were encouraged and answered. The patient understands and consents to the procedure. A time-out was performed prior to the procedure.  The left flank region was prepped with Betadine in a sterile fashion, and a sterile drape was applied covering the operative field. A sterile gown and sterile gloves were used for the procedure. Local anesthesia was provided with 1% Lidocaine.  Ultrasound was performed of both kidneys. The left kidney was chosen for biopsy. Under ultrasound guidance, 2 separate core biopsy samples were obtained through lower pole  cortex utilizing a 16 gauge needle biopsy device. Postprocedural ultrasound was performed.  COMPLICATIONS: Small amount of perinephric hemorrhage. SIR level A: No therapy, no consequence.  FINDINGS: Both kidneys were well visualized by ultrasound. Percutaneous window to renal cortex was slightly better on the left compared to the right. Two solid core biopsy samples were obtained. Post biopsy imaging shows a small amount of perinephric hemorrhage adjacent to the lower pole of the left kidney. This did not visibly enlarge over several minutes of sonographic surveillance.  IMPRESSION: Ultrasound-guided core biopsy performed of the lower pole of the left kidney. The procedure was complicated by a small amount of focal perinephric hemorrhage adjacent to the lower pole.   Electronically Signed   By: Aletta Edouard M.D.   On: 02/05/2015 16:15    Labs:  CBC:  Recent Labs  02/04/15 0508 02/05/15 0514 02/06/15 0439 02/07/15 0429  WBC 18.2* 22.8* 19.9*  24.6*  HGB 8.7* 9.2* 8.5* 6.7*  HCT 27.4* 29.5* 27.2* 21.7*  PLT 137* 122* 102* 75*    COAGS:  Recent Labs  02/04/15 0508 02/05/15 0514 02/06/15 0439 02/07/15 0429  INR 1.87* 1.56* 1.44 1.73*    BMP:  Recent Labs  02/04/15 0508 02/05/15 0514 02/06/15 0439 02/07/15 0429  NA 136 136 136 134*  K 4.2 3.7 3.8 4.0  CL 105 104 105 105  CO2 22 20* 21* 20*  GLUCOSE 150* 120* 120* 137*  BUN 54* 64* 71* 77*  CALCIUM 8.1* 8.1* 8.0* 7.9*  CREATININE 4.58* 4.81* 5.35* 5.82*  GFRNONAA 12* 11* 10* 9*  GFRAA 13* 13* 11* 10*    LIVER FUNCTION TESTS:  Recent Labs  12/19/14 0531 12/26/14 0522 01/06/15 1609 01/30/15 0239  02/04/15 0508 02/05/15 0514 02/06/15 0439 02/07/15 0429  BILITOT 0.5 0.4 0.7 0.9  --   --   --   --   --   AST 24 49* 46* 31  --   --   --   --   --   ALT 10 33 37 13*  --   --   --   --   --   ALKPHOS 60 96 136* 65  --   --   --   --   --   PROT 6.9 6.5 6.7 5.4*  --   --   --   --   --   ALBUMIN 2.5* 2.2* 2.2* 2.0*  < > 2.3* 2.2* 2.2* 2.2*  < > = values in this interval not displayed.  Assessment and Plan: LE swelling and pain Shortness of breath on chronic O2 2L LE venous dopplers as outpatient 5/18 at Dr. Chase Caller office showed acute RLE DVT in peroneal, mid and distal posterior tibial veins-admitted and placed on IV heparin and coumadin VQ scan with intermediate probability  AKI s/p renal biopsy 5/25, will use CO2 for venogram Anemia-blood loss, left perinephric hemorrhage CT 5/27 and receiving [facility] Request for Retrievable IVC filter placement with sedation since anticoagulation has been discontinued The patient has been NPO, no blood thinners taken, labs and vitals have been reviewed. Risks and Benefits discussed with the patient and his family including, but not limited to bleeding, infection, filter fracture or migration which can lead to emergency surgery or even death, strut penetration with damage or irritation to adjacent structures and caval  thrombosis. All of the patient's questions were answered, patient is agreeable to proceed. Consent signed and in chart. Recommend IVC filter removal in 3-6 months once bleeding has resolved and  patient tolerates anticoagulation, d/w family and patient.  Probable Sjogren's follows with Rheumatology.   Thank you for this interesting consult.  I greatly enjoyed meeting Singleton Polek and look forward to participating in their care.  SignedHedy Jacob 02/07/2015, 1:22 PM   I spent a total of 20 Minutes in face to face in clinical consultation, greater than 50% of which was counseling/coordinating care for acute RLE DVT and intermediate probability VQ scan with active bleeding.

## 2015-02-07 NOTE — Progress Notes (Signed)
CRITICAL VALUE ALERT  Critical value received:  hgb 6.7  Date of notification:  02/07/2015  Time of notification:  0650  Critical value read back:yes  Nurse who received alert:  A.Sherece Gambrill,rn  MD notified (1st page):  Feliz-Ortiz  Time of first page:  0700  MD notified (2nd page):Feliz-Ortiz  Time of second page:verbalized [id number]  Responding MD:  Aileen Fass  Time MD responded:  [phone]

## 2015-02-07 NOTE — Procedures (Signed)
Placement of IVC filter due to DVT and perinephric hematoma.  Filter placed below renal veins without difficulty.  No immediate complication.  Minimal blood loss. See procedure note in PACS.

## 2015-02-07 NOTE — Progress Notes (Signed)
[facility] ROUNDING NOTE   Subjective:   Interval History:  No complaints today appears less dyspneic. Good urine output non bloody  Transfuse for drop in hemoglobin   Objective:  Vital signs in last 24 hours:  Temp:  [97.5 F (36.4 C)-98.5 F (36.9 C)] 97.5 F (36.4 C) (05/27 0603) Pulse Rate:  [91-106] 106 (05/27 0603) Resp:  [18] 18 (05/27 0603) BP: (133-161)/(73-80) 133/73 mmHg (05/27 0603) SpO2:  [97 %-99 %] 98 % (05/27 0603) Weight:  [95.845 kg (211 lb 4.8 oz)] 95.845 kg (211 lb 4.8 oz) (05/27 0603)  Weight change: -1.043 kg (-2 lb 4.8 oz) Filed Weights   02/05/15 0533 02/06/15 0504 02/07/15 0603  Weight: 97.433 kg (214 lb 12.8 oz) 96.888 kg (213 lb 9.6 oz) 95.845 kg (211 lb 4.8 oz)    Intake/Output: I/O last 3 completed shifts: In: 775 [P.O.:775] Out: 1650 [Urine:1650]   Intake/Output this shift:  Total I/O In: 240 [P.O.:240] Out: -   CVS- RRR JVP mild increased RS- Fine end inspiratory rales ABD- BS present soft non-distended EXT- 2 + edema   Basic Metabolic Panel:  Recent Labs Lab 02/03/15 0430 02/04/15 0508 02/05/15 0514 02/06/15 0439 02/07/15 0429  NA 137 136 136 136 134*  K 4.2 4.2 3.7 3.8 4.0  CL 106 105 104 105 105  CO2 24 22 20* 21* 20*  GLUCOSE 147* 150* 120* 120* 137*  BUN 41* 54* 64* 71* 77*  CREATININE 4.12* 4.58* 4.81* 5.35* 5.82*  CALCIUM 8.0* 8.1* 8.1* 8.0* 7.9*  PHOS 5.6* 6.1* 5.6* 6.2* 6.5*    Liver Function Tests:  Recent Labs Lab 02/03/15 0430 02/04/15 0508 02/05/15 0514 02/06/15 0439 02/07/15 0429  ALBUMIN 2.2* 2.3* 2.2* 2.2* 2.2*   No results for input(s): LIPASE, AMYLASE in the last 168 hours. No results for input(s): AMMONIA in the last 168 hours.  CBC:  Recent Labs Lab 02/03/15 0430 02/04/15 0508 02/05/15 0514 02/06/15 0439 02/07/15 0429  WBC 14.4* 18.2* 22.8* 19.9* 24.6*  HGB 7.8* 8.7* 9.2* 8.5* 6.7*  HCT 25.2* 27.4* 29.5* 27.2* 21.7*  MCV 95.8 95.1 95.8 97.5 98.2  PLT 159 137*  122* 102* 75*    Cardiac Enzymes: No results for input(s): CKTOTAL, CKMB, CKMBINDEX, TROPONINI in the last 168 hours.  BNP: Invalid input(s): POCBNP  CBG:  Recent Labs Lab 02/06/15 0514 02/06/15 1138 02/06/15 1711 02/06/15 2058 02/07/15 0608  GLUCAP 117* 115* 143* 128* 122*    Microbiology: Results for orders placed or performed during the hospital encounter of 01/06/15  Blood Culture (routine x 2)     Status: None   Collection Time: 01/06/15  6:25 PM  Result Value Ref Range Status   Specimen Description BLOOD LEFT ARM  Final   Special Requests BOTTLES DRAWN AEROBIC AND ANAEROBIC 5CC  Final   Culture   Final    NO GROWTH 5 DAYS Performed at [facility]    Report Status 01/13/2015 FINAL  Final  Blood Culture (routine x 2)     Status: None   Collection Time: 01/06/15  6:40 PM  Result Value Ref Range Status   Specimen Description BLOOD LEFT HAND  Final   Special Requests BOTTLES DRAWN AEROBIC AND ANAEROBIC 5CC  Final   Culture   Final    NO GROWTH 5 DAYS Performed at [facility]    Report Status 01/13/2015 FINAL  Final  Respiratory virus antigens panel     Status: None   Collection Time: 01/06/15  6:40  PM  Result Value Ref Range Status   Source - RVPAN NASAL SWAB  Corrected   Respiratory Syncytial Virus A Negative Negative Final   Respiratory Syncytial Virus B Negative Negative Final   Influenza A Negative Negative Final   Influenza B Negative Negative Final   Parainfluenza 1 Negative Negative Final   Parainfluenza 2 Negative Negative Final   Parainfluenza 3 Negative Negative Final   Metapneumovirus Negative Negative Final   Rhinovirus Negative Negative Final   Adenovirus Negative Negative Final    Comment: (NOTE) Performed At: [facility] [address] [place], [place] [id number] Lindon Romp MD [id number]   Urine culture     Status: None   Collection Time: 01/06/15  6:42 PM  Result Value Ref Range Status   Specimen  Description URINE, CLEAN CATCH  Final   Special Requests NONE  Final   Colony Count   Final    25,000 COLONIES/ML Performed at [facility]    Culture   Final    Multiple bacterial morphotypes present, none predominant. Suggest appropriate recollection if clinically indicated. Performed at [facility]    Report Status 01/07/2015 FINAL  Final  Culture, expectorated sputum-assessment     Status: None   Collection Time: 01/07/15  9:55 AM  Result Value Ref Range Status   Specimen Description SPUTUM  Final   Special Requests NONE  Final   Sputum evaluation   Final    MICROSCOPIC FINDINGS SUGGEST THAT THIS SPECIMEN IS NOT REPRESENTATIVE OF LOWER RESPIRATORY SECRETIONS. PLEASE RECOLLECT. Gram Stain Report Called to,Read Back By and Verified With: Vale Haven AT 1336 01/07/15 BY K BARR    Report Status 01/07/2015 FINAL  Final    Coagulation Studies:  Recent Labs  02/05/15 0514 02/06/15 0439 02/07/15 0429  LABPROT 18.7* 17.6* 20.2*  INR 1.56* 1.44 1.73*    Urinalysis: No results for input(s): COLORURINE, LABSPEC, PHURINE, GLUCOSEU, HGBUR, BILIRUBINUR, KETONESUR, PROTEINUR, UROBILINOGEN, NITRITE, LEUKOCYTESUR in the last 72 hours.  Invalid input(s): APPERANCEUR    Imaging: US Biopsy  02/05/2015   CLINICAL DATA:  Acute renal failure and glomerulonephritis. The patient requires renal biopsy.  EXAM: ULTRASOUND GUIDED CORE BIOPSY OF LEFT KIDNEY  MEDICATIONS: 1.0 mg IV Versed; 50 mcg IV Fentanyl  Total Moderate Sedation Time: 15 minutes  PROCEDURE: The procedure, risks, benefits, and alternatives were explained to the patient. Questions regarding the procedure were encouraged and answered. The patient understands and consents to the procedure. A time-out was performed prior to the procedure.  The left flank region was prepped with Betadine in a sterile fashion, and a sterile drape was applied covering the operative field. A sterile gown and sterile gloves were used for  the procedure. Local anesthesia was provided with 1% Lidocaine.  Ultrasound was performed of both kidneys. The left kidney was chosen for biopsy. Under ultrasound guidance, 2 separate core biopsy samples were obtained through lower pole cortex utilizing a 16 gauge needle biopsy device. Postprocedural ultrasound was performed.  COMPLICATIONS: Small amount of perinephric hemorrhage. SIR level A: No therapy, no consequence.  FINDINGS: Both kidneys were well visualized by ultrasound. Percutaneous window to renal cortex was slightly better on the left compared to the right. Two solid core biopsy samples were obtained. Post biopsy imaging shows a small amount of perinephric hemorrhage adjacent to the lower pole of the left kidney. This did not visibly enlarge over several minutes of sonographic surveillance.  IMPRESSION: Ultrasound-guided core biopsy performed of the lower pole of the left  kidney. The procedure was complicated by a small amount of focal perinephric hemorrhage adjacent to the lower pole.   Electronically Signed   By: Aletta Edouard M.D.   On: 02/05/2015 16:15     Medications:   . sodium chloride Stopped (02/01/15 1719)   . sodium chloride   Intravenous Once  . cyanocobalamin  500 mcg Oral Daily  . docusate sodium  100 mg Oral BID  . feeding supplement (NEPRO CARB STEADY)  237 mL Oral BID BM  . ferrous sulfate  325 mg Oral BID WC  . furosemide  40 mg Intravenous Once  . insulin aspart  0-9 Units Subcutaneous TID WC  . pantoprazole  40 mg Oral Daily  . polyethylene glycol  17 g Oral Daily  . predniSONE  60 mg Oral Q breakfast  . sodium chloride  3 mL Intravenous Q12H  . Warfarin - Pharmacist Dosing Inpatient   Does not apply q1800   acetaminophen **OR** acetaminophen, alum & mag hydroxide-simeth, hydrALAZINE  Assessment/ Plan:  73 y.o. year-old with hx of DJD and GERD, otherwise healthy until April 2016 in hospital twice with pulm infiltrates (pna vs fibrosis), cough, SOB, LE edema.  Work up was ANA negative but was + for RF, antiRo and antiLa and for ANCA. He has been treated with some oral prednisone. He was ordered for home O2 by his PCP the week prior to admission. On 5/18 presented to ED with severe fatigue, LE edema. No SOB or CP. Was seen in pulm clinic and dopplers were + for DVT R leg below the knee. Admitted and started on IV heparin. Creat was up, new since last hosp stay, 2.94 on admission and up to 4.58 today. Patient received 3 days of high dose solumedrol and now on 60. Renal biopsy 5/25 Results pending serology ANCA and AntiGBM negative  1. 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 Volume excess/ LE edema - holding fluids, consider lasix cautiously if/ when creat stabilizes.  3. Drop in hemoglobin Heparin stopped and transfuse    LOS: 9 Jaskarn Schweer W @TODAY @10 :59 AM

## 2015-02-07 NOTE — Procedures (Signed)
Central Venous Catheter Insertion Procedure Note Travis [name] [id number] Aug 19, 1941  Procedure: Insertion of Central Venous Catheter Indications: Drug and/or fluid administration and HD and/or plasmapheresis  Procedure Details Consent: Risks of procedure as well as the alternatives and risks of each were explained to the (patient/caregiver).  Consent for procedure obtained. Time Out: Verified patient identification, verified procedure, site/side was marked, verified correct patient position, special equipment/implants available, medications/allergies/relevent history reviewed, required imaging and test results available.  Performed  Maximum sterile technique was used including antiseptics, cap, gloves, gown, hand hygiene, mask and sheet. Skin prep: Chlorhexidine; local anesthetic administered A antimicrobial bonded/coated triple lumen catheter was placed in the left internal jugular vein using the Seldinger technique.  Evaluation Blood flow good Complications: No apparent complications Patient did tolerate procedure well. Chest X-ray ordered to verify placement.  CXR: pending.  Travis [name] 02/07/2015, 11:19 PM

## 2015-02-07 NOTE — Progress Notes (Signed)
PT Cancellation Note  Patient Details Name: Travis [name] MRN: [id number] DOB: May 01, 1941   Cancelled Treatment:    Reason Eval/Treat Not Completed: Medical issues which prohibited therapy. Noted decr Hgb, plans for transfusion, [place] to r/o hematoma, CT of abdomen, and placement of IVC filter.    [name] 02/07/2015, 9:07 AM Pager [phone]

## 2015-02-07 NOTE — Progress Notes (Addendum)
[name] Interim History: 74 y.o. year-old with hx of DJD and GERD, otherwise healthy until April 2016 in hospital twice with pulm infiltrates (pna vs fibrosis), cough, SOB, LE edema. Work up was ANA negative but was + for RF, antiRo and antiLa and for ANCA. He has been treated with some oral prednisone. He was ordered for home O2 by his PCP the week prior to admission. On 5/18 presented to ED with severe fatigue, LE edema. No SOB or CP. Was seen in pulm clinic and dopplers were + for DVT R leg below the knee. Admitted and started on IV heparin. Creat was up, new since last hosp stay, 2.94 on admission and up to 4.58 today. Patient received 3 days of high dose solumedrol and now on 60. Renal on board. Renal biopsy pending (once INR < 1.5)   Assessment/Plan: Acute DVT of right tibial vein with intermediate probability VQ scan: - Trace symptom-free he was started on heparin and Coumadin. - Restarted heparin 4 hr after bioppsy. - Pt hbg has drop and complaing of right side pain. Check a Ct abd and pelvis. - d/c coumadin and heparin.  Acute on chronic kidney disease stage III: - Renal consulted and recommended renal biopsy, performed on 5.25.2016 by IR. - Steroids were started empirically. - Will hold heparin and coumadin pt complaining of left side pain and 2 point drop in Hbg. - get Ultarsound to rule hematoma.  Elevated blood pressure without a diagnosis of hypertension: Continue hydralazine when necessary. And continue to monitor.  Steroid-induced hyperglycemia: - Start on IV Solu-Medrol for 3 days. - Now on oral prednisone. - A1c was 4.9 and continue sliding scale insulin.  Acute on chronic anemia: - Continue Protonix. - 2 point Drop in hemoglobin, heparin, heparin and Coumadin have been DC'd. - I'll go ahead and type and screen 2 units of packed red blood cells.  Physical deconditioning: - Consider physical therapy.  Sjogren's disease - Follow with  rheumatology as an outpatient.   Consultants:  Renal  IR  Procedures:  Renal biopsy on 5.26.2016  Antibiotics:  None  HPI/Subjective: Complaining of left-sided flank pain.  Objective: Filed Vitals:   02/06/15 0504 02/06/15 1512 02/06/15 2100 02/07/15 0603  BP: 147/78 158/80 161/77 133/73  Pulse: 92 91 97 106  Temp: 97.8 F (36.6 C) 98.4 F (36.9 C) 98.5 F (36.9 C) 97.5 F (36.4 C)  TempSrc: Oral Oral Oral Oral  Resp: 18 18 18 18   Height:      Weight: 96.888 kg (213 lb 9.6 oz)   95.845 kg (211 lb 4.8 oz)  SpO2: 97% 99% 97% 98%    Intake/Output Summary (Last 24 hours) at 02/07/15 0744 Last data filed at 02/07/15 0604  Gross per 24 hour  Intake    775 ml  Output   1050 ml  Net   -275 ml   Filed Weights   02/05/15 0533 02/06/15 0504 02/07/15 0603  Weight: 97.433 kg (214 lb 12.8 oz) 96.888 kg (213 lb 9.6 oz) 95.845 kg (211 lb 4.8 oz)    Exam:  General: Alert, awake, oriented x3, in no acute distress.  HEENT: No bruits, no goiter.  Heart: Regular rate and rhythm. Lungs: Good air movement, clear.  Abdomen: Soft, nontender, nondistended, positive bowel sounds.  Neuro: Grossly intact, nonfocal.   Data Reviewed: Basic Metabolic Panel:  Recent Labs Lab 02/03/15 0430 02/04/15 0508 02/05/15 0514 02/06/15 0439 02/07/15 0429  NA 137 136 136 136 134*  K  4.2 4.2 3.7 3.8 4.0  CL 106 105 104 105 105  CO2 24 22 20* 21* 20*  GLUCOSE 147* 150* 120* 120* 137*  BUN 41* 54* 64* 71* 77*  CREATININE 4.12* 4.58* 4.81* 5.35* 5.82*  CALCIUM 8.0* 8.1* 8.1* 8.0* 7.9*  PHOS 5.6* 6.1* 5.6* 6.2* 6.5*   Liver Function Tests:  Recent Labs Lab 02/03/15 0430 02/04/15 0508 02/05/15 0514 02/06/15 0439 02/07/15 0429  ALBUMIN 2.2* 2.3* 2.2* 2.2* 2.2*   No results for input(s): LIPASE, AMYLASE in the last 168 hours. No results for input(s): AMMONIA in the last 168 hours. CBC:  Recent Labs Lab 02/03/15 0430 02/04/15 0508 02/05/15 0514 02/06/15 0439  02/07/15 0429  WBC 14.4* 18.2* 22.8* 19.9* 24.6*  HGB 7.8* 8.7* 9.2* 8.5* 6.7*  HCT 25.2* 27.4* 29.5* 27.2* 21.7*  MCV 95.8 95.1 95.8 97.5 98.2  PLT 159 137* 122* 102* 75*   Cardiac Enzymes: No results for input(s): CKTOTAL, CKMB, CKMBINDEX, TROPONINI in the last 168 hours. BNP (last 3 results)  Recent Labs  12/17/14 1934 01/02/15 1241 01/06/15 1758  BNP 38.6 66.6 83.3    ProBNP (last 3 results) No results for input(s): PROBNP in the last 8760 hours.  CBG:  Recent Labs Lab 02/06/15 0514 02/06/15 1138 02/06/15 1711 02/06/15 2058 02/07/15 0608  GLUCAP 117* 115* 143* 128* 122*    No results found for this or any previous visit (from the past 240 hour(s)).   Studies: US Biopsy  02/05/2015   CLINICAL DATA:  Acute renal failure and glomerulonephritis. The patient requires renal biopsy.  EXAM: ULTRASOUND GUIDED CORE BIOPSY OF LEFT KIDNEY  MEDICATIONS: 1.0 mg IV Versed; 50 mcg IV Fentanyl  Total Moderate Sedation Time: 15 minutes  PROCEDURE: The procedure, risks, benefits, and alternatives were explained to the patient. Questions regarding the procedure were encouraged and answered. The patient understands and consents to the procedure. A time-out was performed prior to the procedure.  The left flank region was prepped with Betadine in a sterile fashion, and a sterile drape was applied covering the operative field. A sterile gown and sterile gloves were used for the procedure. Local anesthesia was provided with 1% Lidocaine.  Ultrasound was performed of both kidneys. The left kidney was chosen for biopsy. Under ultrasound guidance, 2 separate core biopsy samples were obtained through lower pole cortex utilizing a 16 gauge needle biopsy device. Postprocedural ultrasound was performed.  COMPLICATIONS: Small amount of perinephric hemorrhage. SIR level A: No therapy, no consequence.  FINDINGS: Both kidneys were well visualized by ultrasound. Percutaneous window to renal cortex was slightly  better on the left compared to the right. Two solid core biopsy samples were obtained. Post biopsy imaging shows a small amount of perinephric hemorrhage adjacent to the lower pole of the left kidney. This did not visibly enlarge over several minutes of sonographic surveillance.  IMPRESSION: Ultrasound-guided core biopsy performed of the lower pole of the left kidney. The procedure was complicated by a small amount of focal perinephric hemorrhage adjacent to the lower pole.   Electronically Signed   By: Aletta Edouard M.D.   On: 02/05/2015 16:15    Scheduled Meds: . sodium chloride   Intravenous Once  . cyanocobalamin  500 mcg Oral Daily  . docusate sodium  100 mg Oral BID  . ferrous sulfate  325 mg Oral BID WC  . insulin aspart  0-9 Units Subcutaneous TID WC  . pantoprazole  40 mg Oral Daily  . polyethylene glycol  17 g Oral  Daily  . predniSONE  60 mg Oral Q breakfast  . sodium chloride  3 mL Intravenous Q12H  . Warfarin - Pharmacist Dosing Inpatient   Does not apply q1800   Continuous Infusions: . sodium chloride Stopped (02/01/15 1719)  . heparin 1,500 Units/hr (02/06/15 2055)    Time Spent: 15 min   Charlynne Cousins  [facility] Pager [phone]. If 7PM-7AM, please contact night-coverage at [email or web address], password [facility] 02/07/2015, 7:44 AM  LOS: 9 days

## 2015-02-07 NOTE — Progress Notes (Signed)
Medicare Important Message given? Yes (If Response is "NO", the following Medicare IM given date fields will be blank) Date Medicare IM given:  02/07/15 Medicare IM given by: Sanaai Doane 

## 2015-02-07 NOTE — Progress Notes (Signed)
02/07/2015 1245 Dr. Olevia Bowens paged and made aware of CT results available in [facility] for review. Will await orders if indicated. Will continue to closely monitor patient.  Narissa Beaufort, Arville Lime

## 2015-02-07 NOTE — Progress Notes (Signed)
02/07/2015 1600 Pt. C/o continued flank pain. Dr. Olevia Bowens paged and made aware. PRN morphine ordered and administered to patient. Will continue to closely monitor patient.  Marvel Mcphillips, Arville Lime

## 2015-02-08 LAB — RENAL FUNCTION PANEL
Albumin: 2.3 g/dL — ABNORMAL LOW (ref 3.5–5.0)
Anion gap: 9 (ref 5–15)
BUN: 80 mg/dL — ABNORMAL HIGH (ref 6–20)
CO2: 22 mmol/L (ref 22–32)
Calcium: 8 mg/dL — ABNORMAL LOW (ref 8.9–10.3)
Chloride: 103 mmol/L (ref 101–111)
Creatinine, Ser: 6.18 mg/dL — ABNORMAL HIGH (ref 0.61–1.24)
GFR calc Af Amer: 9 mL/min — ABNORMAL LOW
GFR calc non Af Amer: 8 mL/min — ABNORMAL LOW
Glucose, Bld: 118 mg/dL — ABNORMAL HIGH (ref 65–99)
Phosphorus: 6.7 mg/dL — ABNORMAL HIGH (ref 2.5–4.6)
Potassium: 4.1 mmol/L (ref 3.5–5.1)
Sodium: 134 mmol/L — ABNORMAL LOW (ref 135–145)

## 2015-02-08 LAB — CBC
HCT: 25.9 % — ABNORMAL LOW (ref 39.0–52.0)
Hemoglobin: 8.4 g/dL — ABNORMAL LOW (ref 13.0–17.0)
MCH: 30.1 pg (ref 26.0–34.0)
MCHC: 32.4 g/dL (ref 30.0–36.0)
MCV: 92.8 fL (ref 78.0–100.0)
Platelets: 89 K/uL — ABNORMAL LOW (ref 150–400)
RBC: 2.79 MIL/uL — ABNORMAL LOW (ref 4.22–5.81)
RDW: 24.5 % — ABNORMAL HIGH (ref 11.5–15.5)
WBC: 17.9 K/uL — ABNORMAL HIGH (ref 4.0–10.5)

## 2015-02-08 LAB — TYPE AND SCREEN
ABO/RH(D): A POS
Antibody Screen: NEGATIVE
UNIT DIVISION: 0
Unit division: 0

## 2015-02-08 LAB — GLUCOSE, CAPILLARY
GLUCOSE-CAPILLARY: 107 mg/dL — AB (ref 65–99)
Glucose-Capillary: 131 mg/dL — ABNORMAL HIGH (ref 65–99)
Glucose-Capillary: 126 mg/dL — ABNORMAL HIGH (ref 65–99)

## 2015-02-08 LAB — PROTIME-INR
INR: 1.68 — ABNORMAL HIGH (ref 0.00–1.49)
Prothrombin Time: 19.8 seconds — ABNORMAL HIGH (ref 11.6–15.2)

## 2015-02-08 MED ORDER — CALCIUM CARBONATE ANTACID 500 MG PO CHEW
2.0000 | CHEWABLE_TABLET | ORAL | Status: AC
  Administered 2015-02-08 (×2): 400 mg via ORAL
  Filled 2015-02-08: qty 2

## 2015-02-08 MED ORDER — ACETAMINOPHEN 325 MG PO TABS: 650.0000 mg | ORAL_TABLET | ORAL | Status: DC | PRN

## 2015-02-08 MED ORDER — CALCIUM GLUCONATE 10 % IV SOLN
4.0000 g | Freq: Once | INTRAVENOUS | Status: AC
  Administered 2015-02-08: 2 g via INTRAVENOUS
  Filled 2015-02-08: qty 40

## 2015-02-08 MED ORDER — DIPHENHYDRAMINE HCL 25 MG PO CAPS
25.0000 mg | ORAL_CAPSULE | Freq: Four times a day (QID) | ORAL | Status: DC | PRN
Start: 2015-02-08 — End: 2015-02-08
  Administered 2015-02-08: 25 mg via ORAL

## 2015-02-08 MED ORDER — DIPHENHYDRAMINE HCL 25 MG PO CAPS
ORAL_CAPSULE | ORAL | Status: AC
  Administered 2015-02-08: 25 mg via ORAL
  Filled 2015-02-08: qty 1

## 2015-02-08 MED ORDER — ANTICOAGULANT SODIUM CITRATE 4% (200MG/5ML) IV SOLN
5.0000 mL | Freq: Once | Status: AC
  Administered 2015-02-08: 5 mL
  Filled 2015-02-08 (×2): qty 250

## 2015-02-08 MED ORDER — ACD FORMULA A 0.73-2.45-2.2 GM/100ML VI SOLN
500.0000 mL | Status: DC
  Administered 2015-02-08: 500 mL via INTRAVENOUS
  Filled 2015-02-08: qty 500

## 2015-02-08 NOTE — Progress Notes (Signed)
TPE completed without issue. Pt tolerated well, total volume exchanged approx 3800 without s/sx of reaction. Pre medicated with calcium carbonate and benadryl. Pt has no complaints. Next treatment tomorrow, will continue daily for a total of 5 treatments per Dr. Justin Mend. Report called to 2W

## 2015-02-08 NOTE — Progress Notes (Signed)
[name] Interim History: 74 y.o. year-old with hx of DJD and GERD, otherwise healthy until April 2016 in hospital twice with pulm infiltrates (pna vs fibrosis), cough, SOB, LE edema. Work up was ANA negative but was + for RF, antiRo and antiLa and for ANCA. He has been treated with some oral prednisone. He was ordered for home O2 by his PCP the week prior to admission. On 5/18 presented to ED with severe fatigue, LE edema. No SOB or CP. Was seen in pulm clinic and dopplers were + for DVT R leg below the knee. Admitted and started on IV heparin. Creat was up, new since last hosp stay, 2.94 on admission and up to 4.58 today. Patient received 3 days of high dose solumedrol and now on 60. Renal on board. Renal biopsy pending (once INR < 1.5)   Assessment/Plan: Acute DVT of right tibial vein with intermediate probability VQ scan: - After biopsy patient started complaining of flank pain a CT scan of the abdomen and pelvis was done that showed Evidence of moderate left perinephric hemorrhage post percutaneous Biopsy. She also had a 2 g drop in hemoglobin - d/c coumadin and heparin, IR was consulted for an IVC filter placement.  Acute on chronic kidney disease stage III: - Renal consulted and recommended renal biopsy, performed on 5.25.2016 by IR. 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 I discussed the case with renal and recommend plasmapheresis, AnCa and anti-GBM were negative -check ANA, centromere and anti-scleroderma 70 antibodies.  Elevated blood pressure without a diagnosis of hypertension: Continue hydralazine when necessary. And continue to monitor.  Steroid-induced hyperglycemia: - Start on IV Solu-Medrol for 3 days. - Now on oral prednisone, renal has returned it's taper down steroids. - A1c was 4.9 and continue sliding scale insulin.  Acute on chronic anemia: - Continue Protonix. - 2 point Drop in hemoglobin,due  to renal bleeding. - I'll go ahead and type and screen 2 units of packed red blood cells.His hemoglobin 9.5 continue to monitor.  Physical deconditioning: - Consider physical therapy.  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   Consultants:  Renal  IR  Procedures:  Renal biopsy on 5.26.2016  Antibiotics:  None  HPI/Subjective:Left flank pain is improved. No further complaints.   Objective: Filed Vitals:   02/07/15 2336 02/08/15 0146 02/08/15 0438 02/08/15 0600  BP: 155/81 149/71 147/75   Pulse: 97 105 101   Temp: 98.6 F (37 C)  98.6 F (37 C)   TempSrc: Oral  Oral   Resp: 18  18   Height:      Weight:   103 kg (227 lb 1.2 oz)   SpO2: 95% 98% 92% 98%    Intake/Output Summary (Last 24 hours) at 02/08/15 0849 Last data filed at 02/08/15 0200  Gross per 24 hour  Intake   1188 ml  Output    550 ml  Net    638 ml   Filed Weights   02/06/15 0504 02/07/15 0603 02/08/15 0438  Weight: 96.888 kg (213 lb 9.6 oz) 95.845 kg (211 lb 4.8 oz) 103 kg (227 lb 1.2 oz)    Exam:  General: Alert, awake, oriented x3, in no acute distress.  HEENT: No bruits, no goiter.  Heart: Regular rate and rhythm. Lungs: Good air movement, clear.  Abdomen: Soft, nontender, nondistended, positive bowel sounds.  Neuro: Grossly intact, nonfocal.   Data Reviewed: Basic Metabolic Panel:  Recent Labs Lab 02/04/15 [phone]  02/05/15 0514 02/06/15 0439 02/07/15 0429 02/08/15 0247  NA 136 136 136 134* 134*  K 4.2 3.7 3.8 4.0 4.1  CL 105 104 105 105 103  CO2 22 20* 21* 20* 22  GLUCOSE 150* 120* 120* 137* 118*  BUN 54* 64* 71* 77* 80*  CREATININE 4.58* 4.81* 5.35* 5.82* 6.18*  CALCIUM 8.1* 8.1* 8.0* 7.9* 8.0*  PHOS 6.1* 5.6* 6.2* 6.5* 6.7*   Liver Function Tests:  Recent Labs Lab 02/04/15 0508 02/05/15 0514 02/06/15 0439 02/07/15 0429 02/08/15 0247  ALBUMIN 2.3* 2.2* 2.2* 2.2* 2.3*   No results for input(s): LIPASE, AMYLASE in the last 168 hours. No  results for input(s): AMMONIA in the last 168 hours. CBC:  Recent Labs Lab 02/04/15 0508 02/05/15 0514 02/06/15 0439 02/07/15 0429 02/08/15 0247  WBC 18.2* 22.8* 19.9* 24.6* 17.9*  HGB 8.7* 9.2* 8.5* 6.7* 8.4*  HCT 27.4* 29.5* 27.2* 21.7* 25.9*  MCV 95.1 95.8 97.5 98.2 92.8  PLT 137* 122* 102* 75* 89*   Cardiac Enzymes: No results for input(s): CKTOTAL, CKMB, CKMBINDEX, TROPONINI in the last 168 hours. BNP (last 3 results)  Recent Labs  12/17/14 1934 01/02/15 1241 01/06/15 1758  BNP 38.6 66.6 83.3    ProBNP (last 3 results) No results for input(s): PROBNP in the last 8760 hours.  CBG:  Recent Labs Lab 02/07/15 0608 02/07/15 1122 02/07/15 1607 02/07/15 2118 02/08/15 0557  GLUCAP 122* 142* 130* 135* 131*    No results found for this or any previous visit (from the past 240 hour(s)).   Studies: Ct Abdomen Pelvis Wo Contrast  02/07/2015   CLINICAL DATA:  Left-sided abdominal pain. Left renal biopsy yesterday. Increased pain today with low hemoglobin.  EXAM: CT ABDOMEN AND PELVIS WITHOUT CONTRAST  TECHNIQUE: Multidetector CT imaging of the abdomen and pelvis was performed following the standard protocol without IV contrast.  COMPARISON:  Chest CT 12/19/2014 and chest CT 01/02/2015  FINDINGS: Lung bases demonstrate small to moderate bilateral pleural effusions which are worse with associated bibasilar airspace opacification without significant change which may be due to pneumonia versus atelectasis. Small amount of pericardial fluid unchanged.  Abdominal images demonstrate the left kidney to be normal in size without hydronephrosis, nephrolithiasis or focal mass. There is moderate mixed attenuation left perinephric hemorrhage from patient's recent percutaneous biopsy. Hemorrhage is worse over the posterior perinephric space which extends inferiorly below the kidney abutting the psoas muscle extending to the level of the left pelvis just above the inguinal region. This  hemorrhage displaces the kidney slightly anteriorly.  The liver, spleen, pancreas, adrenal glands and right kidney are within normal. There is mild layering material within the gallbladder which may represent sludge versus non calcified stones. Appendix is normal. There is diverticulosis of the colon.  Pelvic images demonstrate the bladder, prostate and rectum to be within normal. There is fluid in the presacral space. There is spondylosis of the lumbar spine with disc disease at the L4-5 level.  IMPRESSION: Evidence of moderate left perinephric hemorrhage post percutaneous biopsy yesterday. Hemorrhage is worse over the posterior perinephric space with mild displacement of the kidney anteriorly. Hemorrhage extends inferiorly to the level of the left pelvis just above the inguinal region. No hydronephrosis.  Worsening moderate size bilateral pleural effusions with associated airspace disease in the lower lungs likely pneumonia and less likely atelectasis.  Small amount of pericardial fluid unchanged.  Sludge versus noncalcified gallstones.  Mild diverticulosis of the colon.   Electronically Signed   By: Marin Olp  M.D.   On: 02/07/2015 12:12   Ir Ivc Filter Plmt / S&i /img Guid/mod Sed  02/07/2015   INDICATION: History of right lower extremity DVT and development of a left perinephric hematoma following a left kidney biopsy. Patient is not a candidate for anticoagulation at this time and needs pulmonary embolism prophylaxis.  EXAM: IVC FILTER PLACEMENT; IVC VENOGRAM; ULTRASOUND FOR VASCULAR ACCESS  Physician: Stephan Minister. Henn, MD  FLUOROSCOPY TIME:  4 minutes and 18 seconds, 140.6 mGy  MEDICATIONS: 1 mg versed, 25 mcg fentanyl. A radiology nurse monitored the patient for moderate sedation.  ANESTHESIA/SEDATION: Moderate sedation time: 20 minutes  CONTRAST:  Carbon dioxide  PROCEDURE: The procedure was explained to the patient. The risks and benefits of the procedure were discussed and the patient's questions were  addressed. Informed consent was obtained from the patient. Ultrasound demonstrated a patent right internal jugular vein. Ultrasound images were obtained for documentation. The right side of the neck was prepped and draped in a sterile fashion. Maximal barrier sterile technique was utilized including caps, mask, sterile gowns, sterile gloves, sterile drape, hand hygiene and skin antiseptic. The skin was anesthetized with 1% lidocaine. A 21 gauge needle was directed into the vein with ultrasound guidance and a micropuncture dilator set was placed. A wire was advanced into the IVC. The filter sheath was advanced over the wire into the IVC. An IVC venogram was performed with carbon dioxide. Fluoroscopic images were obtained for documentation. Bilateral renal veins were cannulated with a Bentson wire using a vert catheter. A Bard Denali filter was deployed below the lowest renal vein. Vascular sheath was removed with manual compression. Bandage placed over the puncture site.  FINDINGS: IVC was patent. Bilateral renal veins were identified. The filter was deployed below the lowest renal vein.  COMPLICATIONS: None  IMPRESSION: Successful placement of a retrievable IVC filter.  This IVC filter is potentially retrievable. The patient will be assessed for filter retrieval by Interventional Radiology in approximately 8-12 weeks. Further recommendations regarding filter retrieval, continued surveillance or declaration of device permanence, will be made at that time.   Electronically Signed   By: Markus Daft M.D.   On: 02/07/2015 17:58   Dg Chest Port 1 View  02/07/2015   CLINICAL DATA:  Central line placement.  Initial encounter.  EXAM: PORTABLE CHEST - 1 VIEW  COMPARISON:  Chest radiograph performed 01/29/2015  FINDINGS: The patient's left-sided dual-lumen catheter is noted ending about the mid SVC.  Bibasilar airspace opacities are stable from prior studies and may reflect persistent pneumonia. Small bilateral pleural  effusions are suspected. No pneumothorax is seen.  The cardiomediastinal silhouette is borderline enlarged. No acute osseous abnormalities are identified.  IMPRESSION: 1. Left-sided dual-lumen catheter noted ending about the mid SVC. 2. Persistent bibasilar airspace opacities may reflect persistent pneumonia, given clinical concern. Suspect small bilateral pleural effusions. 3. Borderline cardiomegaly.   Electronically Signed   By: Garald Balding M.D.   On: 02/07/2015 23:57    Scheduled Meds: . calcium gluconate IVPB  4 g Intravenous Once  . cyanocobalamin  500 mcg Oral Daily  . diphenhydrAMINE      . docusate sodium  100 mg Oral BID  . feeding supplement (NEPRO CARB STEADY)  237 mL Oral BID BM  . ferrous sulfate  325 mg Oral BID WC  . insulin aspart  0-9 Units Subcutaneous TID WC  . pantoprazole  40 mg Oral Daily  . polyethylene glycol  17 g Oral Daily  . predniSONE  30 mg Oral Q breakfast   Followed by  . [START ON 02/10/2015] predniSONE  20 mg Oral Q breakfast   Followed by  . [START ON 02/12/2015] predniSONE  10 mg Oral Q breakfast  . sodium chloride  3 mL Intravenous Q12H   Continuous Infusions: . sodium chloride Stopped (02/01/15 1719)    Time Spent: 15 min   FELIZ Marguarite Arbour  [facility] Pager [phone]. If 7PM-7AM, please contact night-coverage at [email or web address], password [facility] 02/08/2015, 8:49 AM  LOS: 10 days

## 2015-02-08 NOTE — Progress Notes (Signed)
[facility] ROUNDING NOTE   Subjective:   Interval History: no change   Objective:  Vital signs in last 24 hours:  Temp:  [97.8 F (36.6 C)-98.7 F (37.1 C)] 98.6 F (37 C) (05/28 0438) Pulse Rate:  [83-105] 101 (05/28 0438) Resp:  [10-18] 18 (05/28 0438) BP: (144-171)/(67-89) 147/75 mmHg (05/28 0438) SpO2:  [92 %-100 %] 98 % (05/28 0600) Weight:  [103 kg (227 lb 1.2 oz)] 103 kg (227 lb 1.2 oz) (05/28 0438)  Weight change: 7.155 kg (15 lb 12.4 oz) Filed Weights   02/06/15 0504 02/07/15 0603 02/08/15 0438  Weight: 96.888 kg (213 lb 9.6 oz) 95.845 kg (211 lb 4.8 oz) 103 kg (227 lb 1.2 oz)    Intake/Output: I/O last 3 completed shifts: In: 1630 [P.O.:1015; I.V.:250; Blood:365] Out: 1050 [Urine:1050]   Intake/Output this shift:  Total I/O In: 333 [Blood:333] Out: 550 [Urine:550]  CVS- RRR JVP mild increased RS- Fine end inspiratory rales ABD- BS present soft non-distended EXT- 2 + edema   Basic Metabolic Panel:  Recent Labs Lab 02/04/15 0508 02/05/15 0514 02/06/15 0439 02/07/15 0429 02/08/15 0247  NA 136 136 136 134* 134*  K 4.2 3.7 3.8 4.0 4.1  CL 105 104 105 105 103  CO2 22 20* 21* 20* 22  GLUCOSE 150* 120* 120* 137* 118*  BUN 54* 64* 71* 77* 80*  CREATININE 4.58* 4.81* 5.35* 5.82* 6.18*  CALCIUM 8.1* 8.1* 8.0* 7.9* 8.0*  PHOS 6.1* 5.6* 6.2* 6.5* 6.7*    Liver Function Tests:  Recent Labs Lab 02/04/15 0508 02/05/15 0514 02/06/15 0439 02/07/15 0429 02/08/15 0247  ALBUMIN 2.3* 2.2* 2.2* 2.2* 2.3*   No results for input(s): LIPASE, AMYLASE in the last 168 hours. No results for input(s): AMMONIA in the last 168 hours.  CBC:  Recent Labs Lab 02/04/15 0508 02/05/15 0514 02/06/15 0439 02/07/15 0429 02/08/15 0247  WBC 18.2* 22.8* 19.9* 24.6* 17.9*  HGB 8.7* 9.2* 8.5* 6.7* 8.4*  HCT 27.4* 29.5* 27.2* 21.7* 25.9*  MCV 95.1 95.8 97.5 98.2 92.8  PLT 137* 122* 102* 75* 89*    Cardiac Enzymes: No results for input(s):  CKTOTAL, CKMB, CKMBINDEX, TROPONINI in the last 168 hours.  BNP: Invalid input(s): POCBNP  CBG:  Recent Labs Lab 02/07/15 0608 02/07/15 1122 02/07/15 1607 02/07/15 2118 02/08/15 0557  GLUCAP 122* 142* 130* 135* 131*    Microbiology: Results for orders placed or performed during the hospital encounter of 01/06/15  Blood Culture (routine x 2)     Status: None   Collection Time: 01/06/15  6:25 PM  Result Value Ref Range Status   Specimen Description BLOOD LEFT ARM  Final   Special Requests BOTTLES DRAWN AEROBIC AND ANAEROBIC 5CC  Final   Culture   Final    NO GROWTH 5 DAYS Performed at [facility]    Report Status 01/13/2015 FINAL  Final  Blood Culture (routine x 2)     Status: None   Collection Time: 01/06/15  6:40 PM  Result Value Ref Range Status   Specimen Description BLOOD LEFT HAND  Final   Special Requests BOTTLES DRAWN AEROBIC AND ANAEROBIC 5CC  Final   Culture   Final    NO GROWTH 5 DAYS Performed at [facility]    Report Status 01/13/2015 FINAL  Final  Respiratory virus antigens panel     Status: None   Collection Time: 01/06/15  6:40 PM  Result Value Ref Range Status   Source - RVPAN NASAL SWAB  Corrected   Respiratory Syncytial Virus A Negative Negative Final   Respiratory Syncytial Virus B Negative Negative Final   Influenza A Negative Negative Final   Influenza B Negative Negative Final   Parainfluenza 1 Negative Negative Final   Parainfluenza 2 Negative Negative Final   Parainfluenza 3 Negative Negative Final   Metapneumovirus Negative Negative Final   Rhinovirus Negative Negative Final   Adenovirus Negative Negative Final    Comment: (NOTE) Performed At: [facility] [address] [place], [place] [id number] Lindon Romp MD [id number]   Urine culture     Status: None   Collection Time: 01/06/15  6:42 PM  Result Value Ref Range Status   Specimen Description URINE, CLEAN CATCH  Final   Special Requests NONE   Final   Colony Count   Final    25,000 COLONIES/ML Performed at [facility]    Culture   Final    Multiple bacterial morphotypes present, none predominant. Suggest appropriate recollection if clinically indicated. Performed at [facility]    Report Status 01/07/2015 FINAL  Final  Culture, expectorated sputum-assessment     Status: None   Collection Time: 01/07/15  9:55 AM  Result Value Ref Range Status   Specimen Description SPUTUM  Final   Special Requests NONE  Final   Sputum evaluation   Final    MICROSCOPIC FINDINGS SUGGEST THAT THIS SPECIMEN IS NOT REPRESENTATIVE OF LOWER RESPIRATORY SECRETIONS. PLEASE RECOLLECT. Gram Stain Report Called to,Read Back By and Verified With: Vale Haven AT [id number] 01/07/15 BY K BARR    Report Status 01/07/2015 FINAL  Final    Coagulation Studies:  Recent Labs  02/06/15 0439 02/07/15 0429 02/08/15 0247  LABPROT 17.6* 20.2* 19.8*  INR 1.44 1.73* 1.68*    Urinalysis: No results for input(s): COLORURINE, LABSPEC, PHURINE, GLUCOSEU, HGBUR, BILIRUBINUR, KETONESUR, PROTEINUR, UROBILINOGEN, NITRITE, LEUKOCYTESUR in the last 72 hours.  Invalid input(s): APPERANCEUR    Imaging: Ct Abdomen Pelvis Wo Contrast  02/07/2015   CLINICAL DATA:  Left-sided abdominal pain. Left renal biopsy yesterday. Increased pain today with low hemoglobin.  EXAM: CT ABDOMEN AND PELVIS WITHOUT CONTRAST  TECHNIQUE: Multidetector CT imaging of the abdomen and pelvis was performed following the standard protocol without IV contrast.  COMPARISON:  Chest CT 12/19/2014 and chest CT 01/02/2015  FINDINGS: Lung bases demonstrate small to moderate bilateral pleural effusions which are worse with associated bibasilar airspace opacification without significant change which may be due to pneumonia versus atelectasis. Small amount of pericardial fluid unchanged.  Abdominal images demonstrate the left kidney to be normal in size without hydronephrosis, nephrolithiasis or  focal mass. There is moderate mixed attenuation left perinephric hemorrhage from patient's recent percutaneous biopsy. Hemorrhage is worse over the posterior perinephric space which extends inferiorly below the kidney abutting the psoas muscle extending to the level of the left pelvis just above the inguinal region. This hemorrhage displaces the kidney slightly anteriorly.  The liver, spleen, pancreas, adrenal glands and right kidney are within normal. There is mild layering material within the gallbladder which may represent sludge versus non calcified stones. Appendix is normal. There is diverticulosis of the colon.  Pelvic images demonstrate the bladder, prostate and rectum to be within normal. There is fluid in the presacral space. There is spondylosis of the lumbar spine with disc disease at the L4-5 level.  IMPRESSION: Evidence of moderate left perinephric hemorrhage post percutaneous biopsy yesterday. Hemorrhage is worse over the posterior perinephric space with mild displacement of the kidney  anteriorly. Hemorrhage extends inferiorly to the level of the left pelvis just above the inguinal region. No hydronephrosis.  Worsening moderate size bilateral pleural effusions with associated airspace disease in the lower lungs likely pneumonia and less likely atelectasis.  Small amount of pericardial fluid unchanged.  Sludge versus noncalcified gallstones.  Mild diverticulosis of the colon.   Electronically Signed   By: Marin Olp M.D.   On: 02/07/2015 12:12   Ir Ivc Filter Plmt / S&i /img Guid/mod Sed  02/07/2015   INDICATION: History of right lower extremity DVT and development of a left perinephric hematoma following a left kidney biopsy. Patient is not a candidate for anticoagulation at this time and needs pulmonary embolism prophylaxis.  EXAM: IVC FILTER PLACEMENT; IVC VENOGRAM; ULTRASOUND FOR VASCULAR ACCESS  Physician: Stephan Minister. Henn, MD  FLUOROSCOPY TIME:  4 minutes and 18 seconds, 140.6 mGy  MEDICATIONS:  1 mg versed, 25 mcg fentanyl. A radiology nurse monitored the patient for moderate sedation.  ANESTHESIA/SEDATION: Moderate sedation time: 20 minutes  CONTRAST:  Carbon dioxide  PROCEDURE: The procedure was explained to the patient. The risks and benefits of the procedure were discussed and the patient's questions were addressed. Informed consent was obtained from the patient. Ultrasound demonstrated a patent right internal jugular vein. Ultrasound images were obtained for documentation. The right side of the neck was prepped and draped in a sterile fashion. Maximal barrier sterile technique was utilized including caps, mask, sterile gowns, sterile gloves, sterile drape, hand hygiene and skin antiseptic. The skin was anesthetized with 1% lidocaine. A 21 gauge needle was directed into the vein with ultrasound guidance and a micropuncture dilator set was placed. A wire was advanced into the IVC. The filter sheath was advanced over the wire into the IVC. An IVC venogram was performed with carbon dioxide. Fluoroscopic images were obtained for documentation. Bilateral renal veins were cannulated with a Bentson wire using a vert catheter. A Bard Denali filter was deployed below the lowest renal vein. Vascular sheath was removed with manual compression. Bandage placed over the puncture site.  FINDINGS: IVC was patent. Bilateral renal veins were identified. The filter was deployed below the lowest renal vein.  COMPLICATIONS: None  IMPRESSION: Successful placement of a retrievable IVC filter.  This IVC filter is potentially retrievable. The patient will be assessed for filter retrieval by Interventional Radiology in approximately 8-12 weeks. Further recommendations regarding filter retrieval, continued surveillance or declaration of device permanence, will be made at that time.   Electronically Signed   By: Markus Daft M.D.   On: 02/07/2015 17:58   Dg Chest Port 1 View  02/07/2015   CLINICAL DATA:  Central line placement.   Initial encounter.  EXAM: PORTABLE CHEST - 1 VIEW  COMPARISON:  Chest radiograph performed 01/29/2015  FINDINGS: The patient's left-sided dual-lumen catheter is noted ending about the mid SVC.  Bibasilar airspace opacities are stable from prior studies and may reflect persistent pneumonia. Small bilateral pleural effusions are suspected. No pneumothorax is seen.  The cardiomediastinal silhouette is borderline enlarged. No acute osseous abnormalities are identified.  IMPRESSION: 1. Left-sided dual-lumen catheter noted ending about the mid SVC. 2. Persistent bibasilar airspace opacities may reflect persistent pneumonia, given clinical concern. Suspect small bilateral pleural effusions. 3. Borderline cardiomegaly.   Electronically Signed   By: Garald Balding M.D.   On: 02/07/2015 23:57     Medications:   . sodium chloride Stopped (02/01/15 1719)   . calcium gluconate IVPB  4 g Intravenous Once  .  cyanocobalamin  500 mcg Oral Daily  . docusate sodium  100 mg Oral BID  . feeding supplement (NEPRO CARB STEADY)  237 mL Oral BID BM  . ferrous sulfate  325 mg Oral BID WC  . insulin aspart  0-9 Units Subcutaneous TID WC  . pantoprazole  40 mg Oral Daily  . polyethylene glycol  17 g Oral Daily  . predniSONE  30 mg Oral Q breakfast   Followed by  . [START ON 02/10/2015] predniSONE  20 mg Oral Q breakfast   Followed by  . [START ON 02/12/2015] predniSONE  10 mg Oral Q breakfast  . sodium chloride  3 mL Intravenous Q12H   acetaminophen **OR** acetaminophen, alum & mag hydroxide-simeth, hydrALAZINE, morphine injection  Assessment/ Plan:  74 y.o. year-old with hx of DJD and GERD, otherwise healthy until April 2016 in hospital twice with pulm infiltrates (pna vs fibrosis), cough, SOB, LE edema. Work up was ANA negative but was + for RF, antiRo and antiLa . He has been treated with some oral prednisone. He was ordered for home O2 by his PCP the week prior to admission. On 5/18 presented to ED with severe  fatigue, LE edema. No SOB or CP. Was seen in pulm clinic and dopplers were + for DVT R leg below the knee. Admitted and started on IV heparin.  Patient received 3 days of high dose solumedrol and now on 60. 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 Taper steroids  2. Volume excess/ LE edema - holding fluids, consider lasix cautiously      3. Drop in hemoglobin Heparin stopped and transfuse  Filter placed  LOS: 10 Travis [name] @TODAY @6 :41 AM

## 2015-02-08 NOTE — Progress Notes (Addendum)
Dr Elsworth Soho notified pt xray posted after CVC placement.  Ordered to notify on call [facility] MD.  MD [facility] paged/notified.  Pt will begin plasmapheresis in the AM.  Pt resting comfortably with call bell within reach, VSS.  Will continue to monitor pt closely.  Claudette Stapler, RN

## 2015-02-09 DIAGNOSIS — Z515 Encounter for palliative care: Secondary | ICD-10-CM | POA: Insufficient documentation

## 2015-02-09 LAB — THERAPEUTIC PLASMA EXCHANGE (BLOOD BANK)
PLASMA VOLUME NEEDED: 4000
UNIT DIVISION: 0
UNIT DIVISION: 0
UNIT DIVISION: 0
UNIT DIVISION: 0
Unit division: 0
Unit division: 0
Unit division: 0
Unit division: 0
Unit division: 0
Unit division: 0
Unit division: 0
Unit division: 0
Unit division: 0
Unit division: 0

## 2015-02-09 LAB — GLUCOSE, CAPILLARY
GLUCOSE-CAPILLARY: 129 mg/dL — AB (ref 65–99)
Glucose-Capillary: 107 mg/dL — ABNORMAL HIGH (ref 65–99)
Glucose-Capillary: 143 mg/dL — ABNORMAL HIGH (ref 65–99)

## 2015-02-09 LAB — RENAL FUNCTION PANEL
ANION GAP: 11 (ref 5–15)
Albumin: 2.5 g/dL — ABNORMAL LOW (ref 3.5–5.0)
BUN: 78 mg/dL — ABNORMAL HIGH (ref 6–20)
CALCIUM: 8.1 mg/dL — AB (ref 8.9–10.3)
CHLORIDE: 102 mmol/L (ref 101–111)
CO2: 25 mmol/L (ref 22–32)
CREATININE: 6.43 mg/dL — AB (ref 0.61–1.24)
GFR, EST AFRICAN AMERICAN: 9 mL/min — AB (ref >60)
GFR, EST NON AFRICAN AMERICAN: 8 mL/min — AB (ref >60)
GLUCOSE: 118 mg/dL — AB (ref 65–99)
Phosphorus: 6.4 mg/dL — ABNORMAL HIGH (ref 2.5–4.6)
Potassium: 3.6 mmol/L (ref 3.5–5.1)
Sodium: 138 mmol/L (ref 135–145)

## 2015-02-09 LAB — CBC
HCT: 25.4 % — ABNORMAL LOW (ref 39.0–52.0)
HEMOGLOBIN: 8.2 g/dL — AB (ref 13.0–17.0)
MCH: 31.3 pg (ref 26.0–34.0)
MCHC: 32.3 g/dL (ref 30.0–36.0)
MCV: 96.9 fL (ref 78.0–100.0)
Platelets: 67 10*3/uL — ABNORMAL LOW (ref 150–400)
RBC: 2.62 MIL/uL — ABNORMAL LOW (ref 4.22–5.81)
RDW: 25.2 % — AB (ref 11.5–15.5)
WBC: 17.5 10*3/uL — ABNORMAL HIGH (ref 4.0–10.5)

## 2015-02-09 LAB — PROTIME-INR
INR: 1.44 (ref 0.00–1.49)
Prothrombin Time: 17.6 seconds — ABNORMAL HIGH (ref 11.6–15.2)

## 2015-02-09 MED ORDER — CALCIUM CARBONATE ANTACID 500 MG PO CHEW
2.0000 | CHEWABLE_TABLET | ORAL | Status: AC
  Administered 2015-02-09: 400 mg via ORAL
  Filled 2015-02-09 (×2): qty 2

## 2015-02-09 MED ORDER — CALCIUM CARBONATE ANTACID 500 MG PO CHEW
CHEWABLE_TABLET | ORAL | Status: AC
  Filled 2015-02-09: qty 2

## 2015-02-09 MED ORDER — DIPHENHYDRAMINE HCL 25 MG PO CAPS
25.0000 mg | ORAL_CAPSULE | Freq: Four times a day (QID) | ORAL | Status: DC | PRN
  Administered 2015-02-09: 25 mg via ORAL

## 2015-02-09 MED ORDER — ACETAMINOPHEN 325 MG PO TABS
650.0000 mg | ORAL_TABLET | ORAL | Status: DC | PRN
  Filled 2015-02-09: qty 2

## 2015-02-09 MED ORDER — SODIUM CHLORIDE 0.9 % IV SOLN
4.0000 g | Freq: Once | INTRAVENOUS | Status: AC
  Administered 2015-02-09: 4 g via INTRAVENOUS
  Filled 2015-02-09: qty 40

## 2015-02-09 MED ORDER — ANTICOAGULANT SODIUM CITRATE 4% (200MG/5ML) IV SOLN
5.0000 mL | Freq: Once | Status: AC
  Administered 2015-02-09: 5 mL
  Filled 2015-02-09: qty 250

## 2015-02-09 MED ORDER — ACD FORMULA A 0.73-2.45-2.2 GM/100ML VI SOLN
500.0000 mL | Status: DC
  Filled 2015-02-09: qty 500

## 2015-02-09 MED ORDER — DIPHENHYDRAMINE HCL 25 MG PO CAPS
ORAL_CAPSULE | ORAL | Status: AC
  Filled 2015-02-09: qty 1

## 2015-02-09 MED ORDER — ACD FORMULA A 0.73-2.45-2.2 GM/100ML VI SOLN
Status: AC
  Administered 2015-02-09: 500 mL
  Filled 2015-02-09: qty 1000

## 2015-02-09 NOTE — Progress Notes (Signed)
[facility] ROUNDING NOTE   Subjective:   Interval History:  Tolerating pheresis #2   Objective:  Vital signs in last 24 hours:  Temp:  [98 F (36.7 C)-98.5 F (36.9 C)] 98 F (36.7 C) (05/29 1228) Pulse Rate:  [86-96] 86 (05/29 1228) Resp:  [18-20] 18 (05/29 1103) BP: (138-158)/(70-78) 142/73 mmHg (05/29 1228) SpO2:  [95 %-100 %] 98 % (05/29 1103) Weight:  [96.4 kg (212 lb 8.4 oz)] 96.4 kg (212 lb 8.4 oz) (05/29 1103)  Weight change: -0.4 kg (-14.1 oz) Filed Weights   02/08/15 0438 02/08/15 0943 02/09/15 1103  Weight: 103 kg (227 lb 1.2 oz) 102.6 kg (226 lb 3.1 oz) 96.4 kg (212 lb 8.4 oz)    Intake/Output: I/O last 3 completed shifts: In: 693 [P.O.:360; Blood:333] Out: 1350 [Urine:1350]   Intake/Output this shift:     CVS- RRR JVP mild increased RS- Fine end inspiratory rales ABD- BS present soft non-distended EXT- 2 + edema   Basic Metabolic Panel:  Recent Labs Lab 02/05/15 0514 02/06/15 0439 02/07/15 0429 02/08/15 0247 02/09/15 0331  NA 136 136 134* 134* 138  K 3.7 3.8 4.0 4.1 3.6  CL 104 105 105 103 102  CO2 20* 21* 20* 22 25  GLUCOSE 120* 120* 137* 118* 118*  BUN 64* 71* 77* 80* 78*  CREATININE 4.81* 5.35* 5.82* 6.18* 6.43*  CALCIUM 8.1* 8.0* 7.9* 8.0* 8.1*  PHOS 5.6* 6.2* 6.5* 6.7* 6.4*    Liver Function Tests:  Recent Labs Lab 02/05/15 0514 02/06/15 0439 02/07/15 0429 02/08/15 0247 02/09/15 0331  ALBUMIN 2.2* 2.2* 2.2* 2.3* 2.5*   No results for input(s): LIPASE, AMYLASE in the last 168 hours. No results for input(s): AMMONIA in the last 168 hours.  CBC:  Recent Labs Lab 02/04/15 0508 02/05/15 0514 02/06/15 0439 02/07/15 0429 02/08/15 0247  WBC 18.2* 22.8* 19.9* 24.6* 17.9*  HGB 8.7* 9.2* 8.5* 6.7* 8.4*  HCT 27.4* 29.5* 27.2* 21.7* 25.9*  MCV 95.1 95.8 97.5 98.2 92.8  PLT 137* 122* 102* 75* 89*    Cardiac Enzymes: No results for input(s): CKTOTAL, CKMB, CKMBINDEX, TROPONINI in the last 168  hours.  BNP: Invalid input(s): POCBNP  CBG:  Recent Labs Lab 02/08/15 0557 02/08/15 1627 02/08/15 2114 02/09/15 0612 02/09/15 1120  GLUCAP 131* 126* 107* 107* 143*    Microbiology: Results for orders placed or performed during the hospital encounter of 01/06/15  Blood Culture (routine x 2)     Status: None   Collection Time: 01/06/15  6:25 PM  Result Value Ref Range Status   Specimen Description BLOOD LEFT ARM  Final   Special Requests BOTTLES DRAWN AEROBIC AND ANAEROBIC 5CC  Final   Culture   Final    NO GROWTH 5 DAYS Performed at [facility]    Report Status 01/13/2015 FINAL  Final  Blood Culture (routine x 2)     Status: None   Collection Time: 01/06/15  6:40 PM  Result Value Ref Range Status   Specimen Description BLOOD LEFT HAND  Final   Special Requests BOTTLES DRAWN AEROBIC AND ANAEROBIC 5CC  Final   Culture   Final    NO GROWTH 5 DAYS Performed at [facility]    Report Status 01/13/2015 FINAL  Final  Respiratory virus antigens panel     Status: None   Collection Time: 01/06/15  6:40 PM  Result Value Ref Range Status   Source - RVPAN NASAL SWAB  Corrected   Respiratory Syncytial Virus  A Negative Negative Final   Respiratory Syncytial Virus B Negative Negative Final   Influenza A Negative Negative Final   Influenza B Negative Negative Final   Parainfluenza 1 Negative Negative Final   Parainfluenza 2 Negative Negative Final   Parainfluenza 3 Negative Negative Final   Metapneumovirus Negative Negative Final   Rhinovirus Negative Negative Final   Adenovirus Negative Negative Final    Comment: (NOTE) Performed At: [facility] [address] [place], [place] [id number] Lindon Romp MD [id number]   Urine culture     Status: None   Collection Time: 01/06/15  6:42 PM  Result Value Ref Range Status   Specimen Description URINE, CLEAN CATCH  Final   Special Requests NONE  Final   Colony Count   Final    25,000  COLONIES/ML Performed at [facility]    Culture   Final    Multiple bacterial morphotypes present, none predominant. Suggest appropriate recollection if clinically indicated. Performed at [facility]    Report Status 01/07/2015 FINAL  Final  Culture, expectorated sputum-assessment     Status: None   Collection Time: 01/07/15  9:55 AM  Result Value Ref Range Status   Specimen Description SPUTUM  Final   Special Requests NONE  Final   Sputum evaluation   Final    MICROSCOPIC FINDINGS SUGGEST THAT THIS SPECIMEN IS NOT REPRESENTATIVE OF LOWER RESPIRATORY SECRETIONS. PLEASE RECOLLECT. Gram Stain Report Called to,Read Back By and Verified With: Vale Haven AT [id number] 01/07/15 BY K BARR    Report Status 01/07/2015 FINAL  Final    Coagulation Studies:  Recent Labs  02/07/15 0429 02/08/15 0247 02/09/15 0331  LABPROT 20.2* 19.8* 17.6*  INR 1.73* 1.68* 1.44    Urinalysis: No results for input(s): COLORURINE, LABSPEC, PHURINE, GLUCOSEU, HGBUR, BILIRUBINUR, KETONESUR, PROTEINUR, UROBILINOGEN, NITRITE, LEUKOCYTESUR in the last 72 hours.  Invalid input(s): APPERANCEUR    Imaging: Ir Ivc Filter Plmt / S&i /img Guid/mod Sed  02/07/2015   INDICATION: History of right lower extremity DVT and development of a left perinephric hematoma following a left kidney biopsy. Patient is not a candidate for anticoagulation at this time and needs pulmonary embolism prophylaxis.  EXAM: IVC FILTER PLACEMENT; IVC VENOGRAM; ULTRASOUND FOR VASCULAR ACCESS  Physician: Stephan Minister. Henn, MD  FLUOROSCOPY TIME:  4 minutes and 18 seconds, 140.6 mGy  MEDICATIONS: 1 mg versed, 25 mcg fentanyl. A radiology nurse monitored the patient for moderate sedation.  ANESTHESIA/SEDATION: Moderate sedation time: 20 minutes  CONTRAST:  Carbon dioxide  PROCEDURE: The procedure was explained to the patient. The risks and benefits of the procedure were discussed and the patient's questions were addressed. Informed consent was  obtained from the patient. Ultrasound demonstrated a patent right internal jugular vein. Ultrasound images were obtained for documentation. The right side of the neck was prepped and draped in a sterile fashion. Maximal barrier sterile technique was utilized including caps, mask, sterile gowns, sterile gloves, sterile drape, hand hygiene and skin antiseptic. The skin was anesthetized with 1% lidocaine. A 21 gauge needle was directed into the vein with ultrasound guidance and a micropuncture dilator set was placed. A wire was advanced into the IVC. The filter sheath was advanced over the wire into the IVC. An IVC venogram was performed with carbon dioxide. Fluoroscopic images were obtained for documentation. Bilateral renal veins were cannulated with a Bentson wire using a vert catheter. A Bard Denali filter was deployed below the lowest renal vein. Vascular sheath was removed with manual  compression. Bandage placed over the puncture site.  FINDINGS: IVC was patent. Bilateral renal veins were identified. The filter was deployed below the lowest renal vein.  COMPLICATIONS: None  IMPRESSION: Successful placement of a retrievable IVC filter.  This IVC filter is potentially retrievable. The patient will be assessed for filter retrieval by Interventional Radiology in approximately 8-12 weeks. Further recommendations regarding filter retrieval, continued surveillance or declaration of device permanence, will be made at that time.   Electronically Signed   By: Markus Daft M.D.   On: 02/07/2015 17:58   Dg Chest Port 1 View  02/07/2015   CLINICAL DATA:  Central line placement.  Initial encounter.  EXAM: PORTABLE CHEST - 1 VIEW  COMPARISON:  Chest radiograph performed 01/29/2015  FINDINGS: The patient's left-sided dual-lumen catheter is noted ending about the mid SVC.  Bibasilar airspace opacities are stable from prior studies and may reflect persistent pneumonia. Small bilateral pleural effusions are suspected. No  pneumothorax is seen.  The cardiomediastinal silhouette is borderline enlarged. No acute osseous abnormalities are identified.  IMPRESSION: 1. Left-sided dual-lumen catheter noted ending about the mid SVC. 2. Persistent bibasilar airspace opacities may reflect persistent pneumonia, given clinical concern. Suspect small bilateral pleural effusions. 3. Borderline cardiomegaly.   Electronically Signed   By: Garald Balding M.D.   On: 02/07/2015 23:57     Medications:   . sodium chloride Stopped (02/01/15 1719)  . citrate dextrose     . anticoagulant sodium citrate  5 mL Intracatheter Once  . calcium carbonate  2 tablet Oral Q3H  . calcium gluconate IVPB  4 g Intravenous Once  . cyanocobalamin  500 mcg Oral Daily  . docusate sodium  100 mg Oral BID  . feeding supplement (NEPRO CARB STEADY)  237 mL Oral BID BM  . ferrous sulfate  325 mg Oral BID WC  . insulin aspart  0-9 Units Subcutaneous TID WC  . pantoprazole  40 mg Oral Daily  . polyethylene glycol  17 g Oral Daily  . [START ON 02/10/2015] predniSONE  20 mg Oral Q breakfast   Followed by  . [START ON 02/12/2015] predniSONE  10 mg Oral Q breakfast  . sodium chloride  3 mL Intravenous Q12H   acetaminophen **OR** acetaminophen, acetaminophen, alum & mag hydroxide-simeth, diphenhydrAMINE, hydrALAZINE, morphine injection  Assessment/ Plan:  74 y.o. year-old with hx of DJD and GERD, otherwise healthy until April 2016 in hospital twice with pulm infiltrates (pna vs fibrosis), cough, SOB, LE edema. Work up was ANA negative but was + for RF, antiRo and antiLa . He has been treated with some oral prednisone. He was ordered for home O2 by his PCP the week prior to admission. On 5/18 presented to ED with severe fatigue, LE edema. No SOB or CP. Was seen in pulm clinic and dopplers were + for DVT R leg below the knee. Admitted and started on IV heparin. Patient received 3 days of high dose solumedrol and now on 60. 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 Taper steroids  2. Volume excess/ LE edema - holding fluids, consider lasix cautiously   3. Drop in hemoglobin Heparin stopped and transfuse Filter placed  Pheresis daily x 5 days.  May need dialysis although it is uncertain if patient will agree and hospice consult has been  obtained to define the goals of care  LOS: 11 Jeslie Lowe W @TODAY @12 :35 PM

## 2015-02-09 NOTE — Consult Note (Signed)
Consultation Note Date: 02/09/2015   Patient Name: Travis [name]  DOB: 1940-12-04  MRN: [id number]  Age / Sex: 74 y.o., male   PCP: [name], MD Referring Physician: Charlynne Cousins, MD  Reason for Consultation: Establishing goals of care  Palliative Care Assessment and Plan Summary of Established Goals of Care and Medical Treatment Preferences    Palliative Care Discussion Held Today Contacts/Participants in Discussion: Primary Decision Maker: Patient    Met with Travis [name] and his wife Travis [name] today.  They are shocked because all this has happened so fast.  He has tolerated plasmapheresis thus far and they hope/pray he can recover from this event.  They are not sure what to expect and feel like they have received a lot of mixed messages.  At times it sounds to them like things are getting better and other times concern that maybe not.  He has had a least some discussion about not persuing dialysis, even if that meant he would pass away in a short time.  I do not think he has a great understanding of his situation or if people really feel like his renal function is likely to return or not. I will talk with Nephrology tomorrow to see if they are able to answer some of his questions about what happened and likelihood of renal recovery.  Certainly an unusual situation with Thrombotic microangiopathy on bx as well as recent ILD diagnosis with sjogrens syndrome as well. He plans on seeing and hoping he can make progress with plasmapheresis over the next 5 days.    Code Status/Advance Care Planning:  Full  Symptom Management:   Denies any acute symptoms. Constipation controlled with bowel regimen.   Psycho-social/Spiritual:   Support System: Wife Travis [name]. He has 2 children and 2 grandchildren. [place] worked for [employer] Very active previously. 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    Desire for further Chaplaincy support:no  Prognosis:  Unable to determine  Discharge Planning:  TBD       Chief Complaint: edema  History of Present Illness:  74 yo male with PMHx of Arthritis, GERD, CKD, Sjogrens who was admitted 5/18 with complaint of fatigue and lower ext edema. 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  His current hospital stay is most notable for AKI.  He was also found to have DVT for which he was anticoagulated. His renal function has worsened here which lead to renal bx revealing thrombotic microangiopathy. After renal bx, he had bleeding which forced discontinuation of anticoagulants and IVC filter placement.  He has been treated with high dose steroids, and given biopsy results recently started on trial of plasmapheresis.  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    Primary Diagnoses  Present on Admission:  . Elevated blood pressure . Steroid-induced hyperglycemia . Peripheral edema . Physical deconditioning . Sjogren's disease . DVT (deep venous thrombosis) . Acute on chronic renal failure  I have reviewed the medical record, interviewed the patient and family, and examined the patient. The following aspects are pertinent.  Past Medical History  Diagnosis Date  . Medical history non-contributory   . Arthritis   . Pneumonia   . GERD (gastroesophageal reflux disease)   . Sjogren's disease 01/28/2015  . Pulmonary fibrosis  History   Social History  . Marital Status: Married    Spouse Name: N/A  . Number of Children: N/A  . Years of Education: N/A   Social History Main Topics  . Smoking status: Never Smoker   . Smokeless tobacco: Never Used  . Alcohol Use: No  . Drug Use: No  . Sexual Activity: Not on file   Other Topics Concern  . None   Social History Narrative   Family History    Problem Relation Age of Onset  . Hypertension Mother    Scheduled Meds: . cyanocobalamin  500 mcg Oral Daily  . docusate sodium  100 mg Oral BID  . feeding supplement (NEPRO CARB STEADY)  237 mL Oral BID BM  . ferrous sulfate  325 mg Oral BID WC  . insulin aspart  0-9 Units Subcutaneous TID WC  . pantoprazole  40 mg Oral Daily  . polyethylene glycol  17 g Oral Daily  . [START ON 02/10/2015] predniSONE  20 mg Oral Q breakfast   Followed by  . [START ON 02/12/2015] predniSONE  10 mg Oral Q breakfast  . sodium chloride  3 mL Intravenous Q12H   Continuous Infusions: . sodium chloride Stopped (02/01/15 1719)  . citrate dextrose     PRN Meds:.acetaminophen **OR** acetaminophen, acetaminophen, alum & mag hydroxide-simeth, diphenhydrAMINE, hydrALAZINE, morphine injection Medications Prior to Admission:  Prior to Admission medications   Medication Sig Start Date End Date Taking? Authorizing Provider  cyanocobalamin 500 MCG tablet Take 1 tablet (500 mcg total) by mouth daily. 12/26/14  Yes Donne Hazel, MD  feeding supplement, ENSURE ENLIVE, (ENSURE ENLIVE) LIQD Take 237 mLs by mouth 2 (two) times daily between meals. 01/09/15  Yes Geradine Girt, DO  ferrous sulfate 325 (65 FE) MG tablet Take 1 tablet (325 mg total) by mouth 2 (two) times daily with a meal. 12/26/14  Yes Donne Hazel, MD  furosemide (LASIX) 40 MG tablet Take 1 tablet (40 mg total) by mouth daily. 01/28/15  Yes Brand Males, MD  loratadine (CLARITIN) 10 MG tablet Take 10 mg by mouth daily.   Yes Historical Provider, MD  nystatin (MYCOSTATIN) 100000 UNIT/ML suspension Take 5 mLs (500,000 Units total) by mouth 4 (four) times daily. For 7 days 01/09/15  Yes Geradine Girt, DO  potassium chloride SA (KLOR-CON M20) 20 MEQ tablet Take 1 tablet (20 mEq total) by mouth daily. 01/28/15  Yes Brand Males, MD  predniSONE (DELTASONE) 10 MG tablet Take 1 tablet (10 mg total) by mouth daily with breakfast. Patient taking differently:  Take 5 mg by mouth daily with breakfast.  01/09/15  Yes Geradine Girt, DO  traMADol-acetaminophen (ULTRACET) 37.5-325 MG per tablet Take 1-2 tablets by mouth every 4 (four) hours as needed for severe pain. 12/26/14  Yes Donne Hazel, MD  enoxaparin (LOVENOX) 100 MG/ML injection Inject 1 mL (100 mg total) into the skin daily. 01/31/15   Barton Dubois, MD  pantoprazole (PROTONIX) 40 MG tablet Take 1 tablet (40 mg total) by mouth daily. 01/31/15   Barton Dubois, MD   No Known Allergies CBC:    Component Value Date/Time   WBC 17.5* 02/09/2015 1600   HGB 8.2* 02/09/2015 1600   HCT 25.4* 02/09/2015 1600   PLT 67* 02/09/2015 1600   MCV 96.9 02/09/2015 1600   NEUTROABS 12.8* 01/29/2015 1438   LYMPHSABS 1.4 01/29/2015 1438   MONOABS 1.2* 01/29/2015 1438   EOSABS 0.2 01/29/2015 1438   BASOSABS 0.0 01/29/2015 1438  Comprehensive Metabolic Panel:    Component Value Date/Time   NA 138 02/09/2015 0331   K 3.6 02/09/2015 0331   CL 102 02/09/2015 0331   CO2 25 02/09/2015 0331   BUN 78* 02/09/2015 0331   CREATININE 6.43* 02/09/2015 0331   GLUCOSE 118* 02/09/2015 0331   CALCIUM 8.1* 02/09/2015 0331   AST 31 01/30/2015 0239   ALT 13* 01/30/2015 0239   ALKPHOS 65 01/30/2015 0239   BILITOT 0.9 01/30/2015 0239   PROT 5.4* 01/30/2015 0239   ALBUMIN 2.5* 02/09/2015 0331    Physical Exam: Vital Signs: BP 164/80 mmHg  Pulse 89  Temp(Src) 98.4 F (36.9 C) (Oral)  Resp 20  Ht 5' 10"  (1.778 m)  Wt 96.8 kg (213 lb 6.5 oz)  BMI 30.62 kg/m2  SpO2 99% SpO2: SpO2: 99 % O2 Device: O2 Device: Nasal Cannula O2 Flow Rate: O2 Flow Rate (L/min): 2 L/min Intake/output summary:  Intake/Output Summary (Last 24 hours) at 02/09/15 1921 Last data filed at 02/09/15 1645  Gross per 24 hour  Intake    360 ml  Output   1000 ml  Net   -640 ml   Baseline Weight: Weight: 97.977 kg (216 lb) Most recent weight: Weight: 96.8 kg (213 lb 6.5 oz)  Exam Findings:  GEN: alert, NAD HEENT: [place], sclera  anicteric CV: regular rate ABD: soft, ND         Palliative Performance Scale: 50            Additional Data Reviewed: Recent Labs     02/08/15  0247  02/09/15  0331  02/09/15  1600  WBC  17.9*   --   17.5*  HGB  8.4*   --   8.2*  PLT  89*   --   67*  NA  134*  138   --   BUN  80*  78*   --   CREATININE  6.18*  6.43*   --        Time Total: 60 minutes Greater than 50%  of this time was spent counseling and coordinating care related to the above assessment and plan.  Signed by: Isaac Laud, DO  Doran Clay, DO  02/09/2015, 7:21 PM  Please contact Palliative Medicine Team phone at [phone] for questions and concerns.

## 2015-02-09 NOTE — Progress Notes (Addendum)
[name] Interim History: 74 y.o. year-old with hx of DJD and GERD, otherwise healthy until April 2016 in hospital twice with pulm infiltrates (pna vs fibrosis), cough, SOB, LE edema. Work up was ANA negative but was + for RF, antiRo and antiLa and for ANCA. He has been treated with some oral prednisone. He was ordered for home O2 by his PCP the week prior to admission. On 5/18 presented to ED with severe fatigue, LE edema. No SOB or CP. Was seen in pulm clinic and dopplers were + for DVT R leg below the knee. Admitted and started on IV heparin. Creat was up, new since last hosp stay, 2.94 on admission and up to 4.58 today. Patient received 3 days of high dose solumedrol and now on 60. Renal on board. Renal biopsy pending (once INR < 1.5)   Assessment/Plan: Acute DVT of right tibial vein with intermediate probability VQ scan: - CT scan of the abdomen and pelvis was done that showed Evidence of moderate left perinephric hemorrhage post percutaneous Biopsy. She also had a 2 g drop in hemoglobin - dc'd coumadin and heparin, IR was consulted for an IVC filter placement.  Acute on chronic kidney disease stage III: - Renal consulted and recommended renal biopsy, performed on 5.25.2016 by IR. - Cont to tapered steroids. 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 I discussed the case with renal and recommend plasmapheresis, ANCa and anti-GBM were negative -Pending ANA, centromere and anti-scleroderma 70 antibodies. - hospice consult pending.  Renal hematoma: - CT scan of the abdomen and pelvis showed evidence of moderate-sized perinephric hemorrhage post percutaneous biopsy. - Anticoagulation stopped. Cbc this am is pending.  Elevated blood pressure without a diagnosis of hypertension: - Continue hydralazine when necessary. And continue to monitor.  Steroid-induced hyperglycemia: - Start on IV Solu-Medrol for 3 days. - Now tapering down steroids. -  A1c was 4.9 and continue sliding scale insulin.  Acute on chronic anemia: - Continue Protonix. - 2 point Drop in hemoglobin,due to renal bleeding. - I'll go ahead and type and screen 2 units of packed red blood cells.His hemoglobin 9.5 continue to monitor.  Physical deconditioning: - Consider physical therapy.  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   Consultants:  Renal  IR  Procedures:  Renal biopsy on 5.26.2016  Antibiotics:  None  HPI/Subjective: Left flank pain is improved. No further complaints.   Objective: Filed Vitals:   02/08/15 1531 02/08/15 1800 02/08/15 1947 02/09/15 0450  BP: 151/72 149/74 158/70 145/78  Pulse: 96 90 94 90  Temp:   98.5 F (36.9 C) 98.2 F (36.8 C)  TempSrc:   Oral Oral  Resp: 20 20 18 18   Height:      Weight:      SpO2: 100% 96% 95% 98%    Intake/Output Summary (Last 24 hours) at 02/09/15 1039 Last data filed at 02/09/15 0624  Gross per 24 hour  Intake    360 ml  Output    800 ml  Net   -440 ml   Filed Weights   02/07/15 0603 02/08/15 0438 02/08/15 0943  Weight: 95.845 kg (211 lb 4.8 oz) 103 kg (227 lb 1.2 oz) 102.6 kg (226 lb 3.1 oz)    Exam:  General: Alert, awake, oriented x3, in no acute distress.  HEENT: No bruits, no goiter.  Heart: Regular rate and rhythm. Lungs: Good air movement, clear.  Abdomen: Soft, nontender, nondistended, positive bowel sounds.  Neuro: Grossly intact,  nonfocal.   Data Reviewed: Basic Metabolic Panel:  Recent Labs Lab 02/05/15 0514 02/06/15 0439 02/07/15 0429 02/08/15 0247 02/09/15 0331  NA 136 136 134* 134* 138  K 3.7 3.8 4.0 4.1 3.6  CL 104 105 105 103 102  CO2 20* 21* 20* 22 25  GLUCOSE 120* 120* 137* 118* 118*  BUN 64* 71* 77* 80* 78*  CREATININE 4.81* 5.35* 5.82* 6.18* 6.43*  CALCIUM 8.1* 8.0* 7.9* 8.0* 8.1*  PHOS 5.6* 6.2* 6.5* 6.7* 6.4*   Liver Function Tests:  Recent Labs Lab 02/05/15 0514 02/06/15 0439 02/07/15 0429 02/08/15 0247  02/09/15 0331  ALBUMIN 2.2* 2.2* 2.2* 2.3* 2.5*   No results for input(s): LIPASE, AMYLASE in the last 168 hours. No results for input(s): AMMONIA in the last 168 hours. CBC:  Recent Labs Lab 02/04/15 0508 02/05/15 0514 02/06/15 0439 02/07/15 0429 02/08/15 0247  WBC 18.2* 22.8* 19.9* 24.6* 17.9*  HGB 8.7* 9.2* 8.5* 6.7* 8.4*  HCT 27.4* 29.5* 27.2* 21.7* 25.9*  MCV 95.1 95.8 97.5 98.2 92.8  PLT 137* 122* 102* 75* 89*   Cardiac Enzymes: No results for input(s): CKTOTAL, CKMB, CKMBINDEX, TROPONINI in the last 168 hours. BNP (last 3 results)  Recent Labs  12/17/14 1934 01/02/15 1241 01/06/15 1758  BNP 38.6 66.6 83.3    ProBNP (last 3 results) No results for input(s): PROBNP in the last 8760 hours.  CBG:  Recent Labs Lab 02/07/15 2118 02/08/15 0557 02/08/15 1627 02/08/15 2114 02/09/15 0612  GLUCAP 135* 131* 126* 107* 107*    No results found for this or any previous visit (from the past 240 hour(s)).   Studies: Ct Abdomen Pelvis Wo Contrast  02/07/2015   CLINICAL DATA:  Left-sided abdominal pain. Left renal biopsy yesterday. Increased pain today with low hemoglobin.  EXAM: CT ABDOMEN AND PELVIS WITHOUT CONTRAST  TECHNIQUE: Multidetector CT imaging of the abdomen and pelvis was performed following the standard protocol without IV contrast.  COMPARISON:  Chest CT 12/19/2014 and chest CT 01/02/2015  FINDINGS: Lung bases demonstrate small to moderate bilateral pleural effusions which are worse with associated bibasilar airspace opacification without significant change which may be due to pneumonia versus atelectasis. Small amount of pericardial fluid unchanged.  Abdominal images demonstrate the left kidney to be normal in size without hydronephrosis, nephrolithiasis or focal mass. There is moderate mixed attenuation left perinephric hemorrhage from patient's recent percutaneous biopsy. Hemorrhage is worse over the posterior perinephric space which extends inferiorly below  the kidney abutting the psoas muscle extending to the level of the left pelvis just above the inguinal region. This hemorrhage displaces the kidney slightly anteriorly.  The liver, spleen, pancreas, adrenal glands and right kidney are within normal. There is mild layering material within the gallbladder which may represent sludge versus non calcified stones. Appendix is normal. There is diverticulosis of the colon.  Pelvic images demonstrate the bladder, prostate and rectum to be within normal. There is fluid in the presacral space. There is spondylosis of the lumbar spine with disc disease at the L4-5 level.  IMPRESSION: Evidence of moderate left perinephric hemorrhage post percutaneous biopsy yesterday. Hemorrhage is worse over the posterior perinephric space with mild displacement of the kidney anteriorly. Hemorrhage extends inferiorly to the level of the left pelvis just above the inguinal region. No hydronephrosis.  Worsening moderate size bilateral pleural effusions with associated airspace disease in the lower lungs likely pneumonia and less likely atelectasis.  Small amount of pericardial fluid unchanged.  Sludge versus noncalcified gallstones.  Mild  diverticulosis of the colon.   Electronically Signed   By: Marin Olp M.D.   On: 02/07/2015 12:12   Ir Ivc Filter Plmt / S&i /img Guid/mod Sed  02/07/2015   INDICATION: History of right lower extremity DVT and development of a left perinephric hematoma following a left kidney biopsy. Patient is not a candidate for anticoagulation at this time and needs pulmonary embolism prophylaxis.  EXAM: IVC FILTER PLACEMENT; IVC VENOGRAM; ULTRASOUND FOR VASCULAR ACCESS  Physician: Stephan Minister. Henn, MD  FLUOROSCOPY TIME:  4 minutes and 18 seconds, 140.6 mGy  MEDICATIONS: 1 mg versed, 25 mcg fentanyl. A radiology nurse monitored the patient for moderate sedation.  ANESTHESIA/SEDATION: Moderate sedation time: 20 minutes  CONTRAST:  Carbon dioxide  PROCEDURE: The procedure was  explained to the patient. The risks and benefits of the procedure were discussed and the patient's questions were addressed. Informed consent was obtained from the patient. Ultrasound demonstrated a patent right internal jugular vein. Ultrasound images were obtained for documentation. The right side of the neck was prepped and draped in a sterile fashion. Maximal barrier sterile technique was utilized including caps, mask, sterile gowns, sterile gloves, sterile drape, hand hygiene and skin antiseptic. The skin was anesthetized with 1% lidocaine. A 21 gauge needle was directed into the vein with ultrasound guidance and a micropuncture dilator set was placed. A wire was advanced into the IVC. The filter sheath was advanced over the wire into the IVC. An IVC venogram was performed with carbon dioxide. Fluoroscopic images were obtained for documentation. Bilateral renal veins were cannulated with a Bentson wire using a vert catheter. A Bard Denali filter was deployed below the lowest renal vein. Vascular sheath was removed with manual compression. Bandage placed over the puncture site.  FINDINGS: IVC was patent. Bilateral renal veins were identified. The filter was deployed below the lowest renal vein.  COMPLICATIONS: None  IMPRESSION: Successful placement of a retrievable IVC filter.  This IVC filter is potentially retrievable. The patient will be assessed for filter retrieval by Interventional Radiology in approximately 8-12 weeks. Further recommendations regarding filter retrieval, continued surveillance or declaration of device permanence, will be made at that time.   Electronically Signed   By: Markus Daft M.D.   On: 02/07/2015 17:58   Dg Chest Port 1 View  02/07/2015   CLINICAL DATA:  Central line placement.  Initial encounter.  EXAM: PORTABLE CHEST - 1 VIEW  COMPARISON:  Chest radiograph performed 01/29/2015  FINDINGS: The patient's left-sided dual-lumen catheter is noted ending about the mid SVC.  Bibasilar  airspace opacities are stable from prior studies and may reflect persistent pneumonia. Small bilateral pleural effusions are suspected. No pneumothorax is seen.  The cardiomediastinal silhouette is borderline enlarged. No acute osseous abnormalities are identified.  IMPRESSION: 1. Left-sided dual-lumen catheter noted ending about the mid SVC. 2. Persistent bibasilar airspace opacities may reflect persistent pneumonia, given clinical concern. Suspect small bilateral pleural effusions. 3. Borderline cardiomegaly.   Electronically Signed   By: Garald Balding M.D.   On: 02/07/2015 23:57    Scheduled Meds: . anticoagulant sodium citrate  5 mL Intracatheter Once  . calcium carbonate      . calcium carbonate  2 tablet Oral Q3H  . calcium gluconate IVPB  4 g Intravenous Once  . citrate dextrose      . cyanocobalamin  500 mcg Oral Daily  . diphenhydrAMINE      . docusate sodium  100 mg Oral BID  . feeding supplement (NEPRO  CARB STEADY)  237 mL Oral BID BM  . ferrous sulfate  325 mg Oral BID WC  . insulin aspart  0-9 Units Subcutaneous TID WC  . pantoprazole  40 mg Oral Daily  . polyethylene glycol  17 g Oral Daily  . [START ON 02/10/2015] predniSONE  20 mg Oral Q breakfast   Followed by  . [START ON 02/12/2015] predniSONE  10 mg Oral Q breakfast  . sodium chloride  3 mL Intravenous Q12H   Continuous Infusions: . sodium chloride Stopped (02/01/15 1719)  . citrate dextrose      Time Spent: 15 min   Charlynne Cousins  [facility] Pager [phone]. If 7PM-7AM, please contact night-coverage at [email or web address], password [facility] 02/09/2015, 10:39 AM  LOS: 11 days

## 2015-02-10 DIAGNOSIS — N179 Acute kidney failure, unspecified: Secondary | ICD-10-CM | POA: Insufficient documentation

## 2015-02-10 LAB — CBC
HEMATOCRIT: 23.4 % — AB (ref 39.0–52.0)
Hemoglobin: 7.3 g/dL — ABNORMAL LOW (ref 13.0–17.0)
MCH: 30.9 pg (ref 26.0–34.0)
MCHC: 31.2 g/dL (ref 30.0–36.0)
MCV: 99.2 fL (ref 78.0–100.0)
PLATELETS: 81 10*3/uL — AB (ref 150–400)
RBC: 2.36 MIL/uL — ABNORMAL LOW (ref 4.22–5.81)
RDW: 25.7 % — AB (ref 11.5–15.5)
WBC: 16.8 10*3/uL — ABNORMAL HIGH (ref 4.0–10.5)

## 2015-02-10 LAB — THERAPEUTIC PLASMA EXCHANGE (BLOOD BANK)
Plasma Exchange: 3800
Plasma volume needed: 3800
UNIT DIVISION: 0
UNIT DIVISION: 0
UNIT DIVISION: 0
UNIT DIVISION: 0
UNIT DIVISION: 0
UNIT DIVISION: 0
UNIT DIVISION: 0
Unit division: 0
Unit division: 0
Unit division: 0
Unit division: 0
Unit division: 0
Unit division: 0
Unit division: 0
Unit division: 0

## 2015-02-10 LAB — PROTIME-INR
INR: 1.26 (ref 0.00–1.49)
Prothrombin Time: 15.9 seconds — ABNORMAL HIGH (ref 11.6–15.2)

## 2015-02-10 LAB — RENAL FUNCTION PANEL
ALBUMIN: 2.5 g/dL — AB (ref 3.5–5.0)
Anion gap: 11 (ref 5–15)
BUN: 72 mg/dL — ABNORMAL HIGH (ref 6–20)
CO2: 25 mmol/L (ref 22–32)
CREATININE: 6.38 mg/dL — AB (ref 0.61–1.24)
Calcium: 8.1 mg/dL — ABNORMAL LOW (ref 8.9–10.3)
Chloride: 101 mmol/L (ref 101–111)
GFR calc non Af Amer: 8 mL/min — ABNORMAL LOW (ref >60)
GFR, EST AFRICAN AMERICAN: 9 mL/min — AB (ref >60)
GLUCOSE: 109 mg/dL — AB (ref 65–99)
POTASSIUM: 3.5 mmol/L (ref 3.5–5.1)
Phosphorus: 5.9 mg/dL — ABNORMAL HIGH (ref 2.5–4.6)
Sodium: 137 mmol/L (ref 135–145)

## 2015-02-10 LAB — POCT I-STAT, CHEM 8
BUN: 88 mg/dL — ABNORMAL HIGH (ref 6–20)
BUN: 79 mg/dL — ABNORMAL HIGH (ref 6–20)
CALCIUM ION: 1.18 mmol/L (ref 1.13–1.30)
CHLORIDE: 96 mmol/L — AB (ref 101–111)
CREATININE: 6.6 mg/dL — AB (ref 0.61–1.24)
Calcium, Ion: 1.01 mmol/L — ABNORMAL LOW (ref 1.13–1.30)
Chloride: 101 mmol/L (ref 101–111)
Creatinine, Ser: 6.1 mg/dL — ABNORMAL HIGH (ref 0.61–1.24)
Glucose, Bld: 166 mg/dL — ABNORMAL HIGH (ref 65–99)
Glucose, Bld: 166 mg/dL — ABNORMAL HIGH (ref 65–99)
HCT: 23 % — ABNORMAL LOW (ref 39.0–52.0)
HCT: 27 % — ABNORMAL LOW (ref 39.0–52.0)
HEMOGLOBIN: 7.8 g/dL — AB (ref 13.0–17.0)
HEMOGLOBIN: 9.2 g/dL — AB (ref 13.0–17.0)
Potassium: 3.9 mmol/L (ref 3.5–5.1)
Potassium: 3.8 mmol/L (ref 3.5–5.1)
Sodium: 135 mmol/L (ref 135–145)
Sodium: 138 mmol/L (ref 135–145)
TCO2: 18 mmol/L (ref 0–100)
TCO2: 22 mmol/L (ref 0–100)

## 2015-02-10 LAB — CRYOGLOBULIN

## 2015-02-10 LAB — GLUCOSE, CAPILLARY
GLUCOSE-CAPILLARY: 152 mg/dL — AB (ref 65–99)
GLUCOSE-CAPILLARY: 132 mg/dL — AB (ref 65–99)
Glucose-Capillary: 120 mg/dL — ABNORMAL HIGH (ref 65–99)
Glucose-Capillary: 111 mg/dL — ABNORMAL HIGH (ref 65–99)

## 2015-02-10 LAB — ADAMTS13 ACTIVITY: Adamts 13 Activity: 51 % — ABNORMAL LOW (ref >66)

## 2015-02-10 LAB — ADAMTS13 ACTIVITY REFLEX

## 2015-02-10 MED ORDER — CALCIUM CARBONATE ANTACID 500 MG PO CHEW
2.0000 | CHEWABLE_TABLET | ORAL | Status: AC
  Administered 2015-02-10: 400 mg via ORAL
  Filled 2015-02-10 (×2): qty 2

## 2015-02-10 MED ORDER — FUROSEMIDE 10 MG/ML IJ SOLN
40.0000 mg | Freq: Two times a day (BID) | INTRAMUSCULAR | Status: AC
  Administered 2015-02-10 (×2): 40 mg via INTRAVENOUS
  Filled 2015-02-10 (×2): qty 4

## 2015-02-10 MED ORDER — CALCIUM CARBONATE ANTACID 500 MG PO CHEW
CHEWABLE_TABLET | ORAL | Status: AC
  Filled 2015-02-10: qty 2

## 2015-02-10 MED ORDER — PROMETHAZINE HCL 25 MG PO TABS: 12.5000 mg | ORAL_TABLET | Freq: Four times a day (QID) | ORAL | Status: DC | PRN

## 2015-02-10 MED ORDER — ONDANSETRON HCL 4 MG/2ML IJ SOLN
4.0000 mg | Freq: Four times a day (QID) | INTRAMUSCULAR | Status: DC | PRN
  Administered 2015-02-10 – 2015-02-16 (×3): 4 mg via INTRAVENOUS
  Filled 2015-02-10 (×3): qty 2

## 2015-02-10 MED ORDER — PROMETHAZINE HCL 25 MG/ML IJ SOLN: 12.5000 mg | Freq: Four times a day (QID) | INTRAMUSCULAR | Status: DC | PRN

## 2015-02-10 MED ORDER — SODIUM CHLORIDE 0.9 % IV SOLN
4.0000 g | Freq: Once | INTRAVENOUS | Status: AC
  Administered 2015-02-10: 4 g via INTRAVENOUS
  Filled 2015-02-10 (×3): qty 40

## 2015-02-10 MED ORDER — HYDRALAZINE HCL 20 MG/ML IJ SOLN: 25.0000 mg | Freq: Three times a day (TID) | INTRAMUSCULAR | Status: DC | PRN

## 2015-02-10 MED ORDER — PROMETHAZINE HCL 12.5 MG RE SUPP
12.5000 mg | Freq: Four times a day (QID) | RECTAL | Status: DC | PRN
Start: 2015-02-10 — End: 2015-02-19
  Filled 2015-02-10: qty 1

## 2015-02-10 MED ORDER — DIPHENHYDRAMINE HCL 25 MG PO CAPS: 25.0000 mg | ORAL_CAPSULE | Freq: Four times a day (QID) | ORAL | Status: DC | PRN

## 2015-02-10 MED ORDER — ACETAMINOPHEN 325 MG PO TABS: 650.0000 mg | ORAL_TABLET | ORAL | Status: DC | PRN

## 2015-02-10 MED ORDER — ACD FORMULA A 0.73-2.45-2.2 GM/100ML VI SOLN
Status: AC
  Administered 2015-02-10: 15:00:00
  Filled 2015-02-10: qty 500

## 2015-02-10 MED ORDER — ACD FORMULA A 0.73-2.45-2.2 GM/100ML VI SOLN
Status: AC
  Administered 2015-02-10: 14:00:00
  Filled 2015-02-10: qty 500

## 2015-02-10 MED ORDER — ANTICOAGULANT SODIUM CITRATE 4% (200MG/5ML) IV SOLN
5.0000 mL | Freq: Once | Status: AC
  Administered 2015-02-10: 5 mL
  Filled 2015-02-10 (×2): qty 250

## 2015-02-10 MED ORDER — ACD FORMULA A 0.73-2.45-2.2 GM/100ML VI SOLN: 500.0000 mL | Status: DC

## 2015-02-10 NOTE — Progress Notes (Addendum)
[name] Interim History: 74 y.o. year-old with hx of DJD and GERD, otherwise healthy until April 2016 in hospital twice with pulm infiltrates (pna vs fibrosis), cough, SOB, LE edema. Work up was ANA negative but was + for RF, antiRo and antiLa and for ANCA. He has been treated with some oral prednisone. He was ordered for home O2 by his PCP the week prior to admission. On 5/18 presented to ED with severe fatigue, LE edema. No SOB or CP. Was seen in pulm clinic and dopplers were + for DVT R leg below the knee. Admitted and started on IV heparin. Creat was up, new since last hosp stay, 2.94 on admission and up to 4.58 today. Patient received 3 days of high dose solumedrol and now on 60. Renal on board. Renal biopsy pending (once INR < 1.5). 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 Was found to be in flank pain Hbg drop so heparin stopped, IV filter placed. CT abd  And pelvis showed large  Perinephric hemorrhage. Renal recommended 5 treatment of plasmapheresis to be completed on 6.1.2016. Tapering steroids.   Assessment/Plan: Acute DVT of right tibial vein with intermediate probability VQ scan: - CT scan of the abdomen and pelvis was done that showed Evidence of moderate left perinephric hemorrhage post percutaneous Biopsy. He also had a 2 g drop in hemoglobin - dc'd coumadin and heparin, IR was consulted for an IVC filter placement.  Acute on chronic kidney disease stage III: - Renal consulted and recommended renal biopsy, performed on 5.25.2016 by IR. 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 - Palliative care was consulted. - Pending ANA, centromere and anti-scleroderma 70 antibodies. -  I agree with IV Lasix.  Renal hematoma: - CT scan of the abdomen and pelvis showed evidence of moderate-sized perinephric hemorrhage post percutaneous biopsy.  Anticoagulation stopped.  - Hemoglobin has continued her jaw, will transfuse one is less than 7, continue to monitor.  Elevated blood pressure without a diagnosis of hypertension: - Continue IV hydralazine, his blood pressure is trending high  Steroid-induced hyperglycemia: - Now tapering down steroids. - A1c was 4.9 and continue sliding scale insulin.  Acute on chronic anemia/acute blood loss anemia: - Continue Protonix. - 2 point Drop in hemoglobin,due to renal bleeding. - I'll go ahead and type and screen 2 units of packed red blood cells.  Physical deconditioning: - Consider physical therapy.  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   Consultants:  Renal  IR  Procedures:  Renal biopsy on 5.26.2016  Antibiotics:  None  HPI/Subjective: Left flank pain is improved. No further complaints. Decrease appetite.  Objective: Filed Vitals:   02/09/15 1248 02/09/15 1437 02/09/15 2013 02/10/15 0427  BP: 140/75 164/80 164/77 153/81  Pulse: 88 89 95 93  Temp: 98.4 F (36.9 C) 98.4 F (36.9 C) 98.3 F (36.8 C) 98.5 F (36.9 C)  TempSrc: Oral Oral Oral Oral  Resp:  20 18 18   Height:      Weight: 96.8 kg (213 lb 6.5 oz)   96.8 kg (213 lb 6.5 oz)  SpO2:  99% 96% 95%    Intake/Output Summary (Last 24 hours) at 02/10/15 0824 Last data filed at 02/10/15 0427  Gross per 24 hour  Intake  240 ml  Output   1200 ml  Net   -960 ml   Filed Weights   02/09/15 1103 02/09/15 1248 02/10/15 0427  Weight: 96.4 kg (212 lb 8.4 oz) 96.8 kg (213 lb 6.5 oz) 96.8 kg (213 lb 6.5 oz)    Exam:  General: Alert, awake, oriented x3, in no acute distress.  HEENT: No bruits, no goiter.  Heart: Regular rate and rhythm. Lungs: Good air movement, clear.  Abdomen: Soft, nontender, nondistended, positive  bowel sounds.  Neuro: Grossly intact, nonfocal.   Data Reviewed: Basic Metabolic Panel:  Recent Labs Lab 02/06/15 0439 02/07/15 0429 02/08/15 0247 02/09/15 0331 02/10/15 0414  NA 136 134* 134* 138 137  K 3.8 4.0 4.1 3.6 3.5  CL 105 105 103 102 101  CO2 21* 20* 22 25 25   GLUCOSE 120* 137* 118* 118* 109*  BUN 71* 77* 80* 78* 72*  CREATININE 5.35* 5.82* 6.18* 6.43* 6.38*  CALCIUM 8.0* 7.9* 8.0* 8.1* 8.1*  PHOS 6.2* 6.5* 6.7* 6.4* 5.9*   Liver Function Tests:  Recent Labs Lab 02/06/15 0439 02/07/15 0429 02/08/15 0247 02/09/15 0331 02/10/15 0414  ALBUMIN 2.2* 2.2* 2.3* 2.5* 2.5*   No results for input(s): LIPASE, AMYLASE in the last 168 hours. No results for input(s): AMMONIA in the last 168 hours. CBC:  Recent Labs Lab 02/06/15 0439 02/07/15 0429 02/08/15 0247 02/09/15 1600 02/10/15 0414  WBC 19.9* 24.6* 17.9* 17.5* 16.8*  HGB 8.5* 6.7* 8.4* 8.2* 7.3*  HCT 27.2* 21.7* 25.9* 25.4* 23.4*  MCV 97.5 98.2 92.8 96.9 99.2  PLT 102* 75* 89* 67* 81*   Cardiac Enzymes: No results for input(s): CKTOTAL, CKMB, CKMBINDEX, TROPONINI in the last 168 hours. BNP (last 3 results)  Recent Labs  12/17/14 1934 01/02/15 1241 01/06/15 1758  BNP 38.6 66.6 83.3    ProBNP (last 3 results) No results for input(s): PROBNP in the last 8760 hours.  CBG:  Recent Labs Lab 02/08/15 2114 02/09/15 0612 02/09/15 1120 02/09/15 1628 02/10/15 0623  GLUCAP 107* 107* 143* 129* 120*    No results found for this or any previous visit (from the past 240 hour(s)).   Studies: No results found.  Scheduled Meds: . cyanocobalamin  500 mcg Oral Daily  . docusate sodium  100 mg Oral BID  . feeding supplement (NEPRO CARB STEADY)  237 mL Oral BID BM  . ferrous sulfate  325 mg Oral BID WC  . furosemide  40 mg Intravenous BID  . insulin aspart  0-9 Units Subcutaneous TID WC  . pantoprazole  40 mg Oral Daily  . polyethylene glycol  17 g Oral Daily  . predniSONE  20 mg Oral Q  breakfast   Followed by  . [START ON 02/12/2015] predniSONE  10 mg Oral Q breakfast  . sodium chloride  3 mL Intravenous Q12H   Continuous Infusions: . sodium chloride Stopped (02/01/15 1719)  . citrate dextrose      Time Spent: 15 min   Charlynne Cousins  [facility] Pager [phone]. If 7PM-7AM, please contact night-coverage at [email or web address], password [facility] 02/10/2015, 8:24 AM  LOS: 12 days

## 2015-02-10 NOTE — Progress Notes (Signed)
Daily Progress Note   Patient Name: Travis [name]       Date: 02/10/2015 DOB: 1941-03-24  Age: 74 y.o. MRN#: [id number] Attending Physician: Charlynne Cousins, MD Primary Care Physician: [name], MD Admit Date: 01/29/2015  Reason for Consultation/Follow-up: Establishing goals of care  Subjective: Eating lunch. No nausea. No pain.  Contemplating information he has received about his kidneys.    Length of Stay: 12 days  Current Medications: Scheduled Meds:  . anticoagulant sodium citrate  5 mL Intracatheter Once  . calcium carbonate  2 tablet Oral Q3H  . calcium gluconate IVPB  4 g Intravenous Once  . citrate dextrose      . cyanocobalamin  500 mcg Oral Daily  . docusate sodium  100 mg Oral BID  . feeding supplement (NEPRO CARB STEADY)  237 mL Oral BID BM  . ferrous sulfate  325 mg Oral BID WC  . furosemide  40 mg Intravenous BID  . insulin aspart  0-9 Units Subcutaneous TID WC  . pantoprazole  40 mg Oral Daily  . polyethylene glycol  17 g Oral Daily  . predniSONE  20 mg Oral Q breakfast   Followed by  . [START ON 02/12/2015] predniSONE  10 mg Oral Q breakfast  . sodium chloride  3 mL Intravenous Q12H    Continuous Infusions: . sodium chloride Stopped (02/01/15 1719)  . citrate dextrose      PRN Meds: acetaminophen **OR** acetaminophen, acetaminophen, alum & mag hydroxide-simeth, diphenhydrAMINE, hydrALAZINE, morphine injection, ondansetron, promethazine **OR** promethazine **OR** promethazine   Vital Signs: BP 153/81 mmHg  Pulse 93  Temp(Src) 98.5 F (36.9 C) (Oral)  Resp 18  Ht 5\' 10"  (1.778 m)  Wt 96.8 kg (213 lb 6.5 oz)  BMI 30.62 kg/m2  SpO2 95% SpO2: SpO2: 95 % O2 Device: O2 Device: Nasal Cannula O2 Flow Rate: O2 Flow Rate (L/min): 2 L/min  Intake/output summary:  Intake/Output Summary (Last 24 hours) at 02/10/15 1252 Last data filed at 02/10/15 0730  Gross per 24 hour  Intake    320 ml  Output   1650 ml  Net  -1330 ml   Baseline Weight:  Weight: 97.977 kg (216 lb) Most recent weight: Weight: 96.8 kg (213 lb 6.5 oz)  Physical Exam: GEN; alert, NAD HEENT: [place], mmm, sclera anicteric CV: reg rate Skin: warm Ext: edema           Additional Data Reviewed: Recent Labs     02/09/15  0331  02/09/15  1600  02/10/15  0414  WBC   --   17.5*  16.8*  HGB   --   8.2*  7.3*  PLT   --   67*  81*  NA  138   --   137  BUN  78*   --   72*  CREATININE  6.43*   --   6.38*     Problem List:  Patient Active Problem List   Diagnosis Date Noted  . Palliative care encounter   . Acute on chronic renal failure 01/31/2015  . Acute DVT of right tibial vein 01/29/2015  . Acute kidney injury 01/29/2015  . DVT (deep venous thrombosis) 01/29/2015  . Acute on chronic respiratory failure with hypoxia 01/28/2015  . Physical deconditioning 01/28/2015  . Sjogren's disease 01/28/2015  . Pedal edema 01/28/2015  . Peripheral edema 01/07/2015  . HCAP (healthcare-associated pneumonia) 01/06/2015  . Steroid-induced hyperglycemia 01/06/2015  . Dyspnea   . Pulmonary fibrosis   . Knee pain   .  Shoulder pain   . 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 Anasarca 12/17/2014  . Fever 12/17/2014  . Elevated blood pressure 12/17/2014  . Normocytic anemia 12/17/2014     Palliative Care Assessment & Plan    Code Status:  Full code  Goals of Care:  See initial consult.  Spoke again wit Travis [name] and Travis [name] today. Travis [name] has had some updates about plan and expects to know more information about whether his kidneys will recover or not by early next week.  His wife and he both reference their spirituality and mention that they believe things are in gods hands. I think they understand how serious his situation is and expressed a lot of worry about what will happen. Initially Travis [name] told me yesterday that dialysis would not be an option, but today he asks a lot of questions about what dialysis is like. I answered these in general and encouraged  him to talk with Nephrology about this as well.  We talked about prognosis in patients with ESRD or stopping dialysis in general.  Travis [name] states that she has had several family members under hospice care in the past and does not like hospice or would not consider it.  She did not give me a lot of specifics about this but alluded to family having only short time.  Difficult for them to discuss the what if's of renal function not recovering and forgoing dialysis. I sense some of this is a desire to stay and focus on positives.  This may be why the clung to some statements about plasmapheresis going well and some of their confusion yesterday. I will continue to follow along and will be on service until Wednesday.  I suspect we will mostly be building rapport until more definitive information about if plasmapharesis/steroids working. They are aware that there is a high probability he will not have renal recovery.   Symptom Management:   Denies any acute symptoms. Constipation controlled with bowel regimen.  Psycho-social/Spiritual:   Support System: Wife Travis [name]. He has 2 children and 2 grandchildren. [place] worked for [employer] Very active previously. 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   Desire for further Chaplaincy support:no  Prognosis: Unable to determine  Discharge Planning: TBD  Care plan was discussed with patient and Dr Olevia Bowens  Thank you for allowing the Palliative Medicine Team to assist in the care of this patient.  Total Time: 30 minutes Greater than 50%  of this time was spent counseling and coordinating care related to the above assessment and plan.   Doran Clay, DO  02/10/2015, 12:52 PM  Please contact Palliative Medicine Team phone at [phone] for questions and concerns.

## 2015-02-10 NOTE — Progress Notes (Signed)
Assisted Pt to ambulate in hall using RW on 2L O2, approx 100 ft.  SPO2 after walk 95%, HR 120.  Pt appreciative, being tended by wife and daughter.

## 2015-02-10 NOTE — Progress Notes (Addendum)
Occupational Therapy Treatment Patient Details Name: Travis [name] MRN: [id number] DOB: 05-Jul-1941 Today's Date: 02/10/2015    History of present illness 74 y.o. male with PMHx of Arthritis, GERD, CKD, Sjogrens who was admitted 5/18 with complaint of fatigue and lower extremity edema. 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 His current hospital stay is most notable for AKI. He was also found to have DVT for which he was anticoagulated. His renal function has worsened here which lead to renal bx revealing thrombotic microangiopathy. After renal bx, he had bleeding which forced discontinuation of anticoagulants and IVC filter placement. He has been treated with high dose steroids, and given biopsy results recently started on trial of plasmapheresis. 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    OT comments  Pt progressing. Education provided in session. See vital section for HR and O2 sats.   Follow Up Recommendations  No OT follow up;Supervision - Intermittent    Equipment Recommendations  None recommended by OT    Recommendations for Other Services      Precautions / Restrictions Precautions Precautions: Fall Precaution Comments: monitor O2 sats and HR Restrictions Weight Bearing Restrictions: No       Mobility Bed Mobility Overal bed mobility: Modified Independent                Transfers Overall transfer level: Needs assistance   Transfers: Sit to/from Stand Sit to Stand: Min guard;Supervision              Balance  Min guard for ambulation with RW. Balance not formally assessed.                                 ADL Overall ADL's : Needs assistance/impaired     Grooming: Oral care;Set up;Supervision/safety;Standing;Applying  deodorant;Sitting (applied lotion)   Upper Body Bathing: Set up;Supervision/ safety;Sitting   Lower Body Bathing: Min guard;Sit to/from stand   Upper Body Dressing : Set up;Supervision/safety;Sitting Upper Body Dressing Details (indicate cue type and reason): OT assisted with gown as he has lines, but feel he could manage shirt Lower Body Dressing: Set up;Supervision/safety;Sitting/lateral leans (donned/doffed socks)   Toilet Transfer: Min guard;Ambulation;RW (chair and bed)   Toileting- Clothing Manipulation and Hygiene: Set up;Sitting/lateral lean (used urinal sitting EOB)       Functional mobility during ADLs: Min guard;Rolling walker General ADL Comments: Educated on energy conservation and deep breathing technique. Explained benefit of therapy/activity. Took breaks in session due to HR.       Vision                     Perception     Praxis      Cognition  Awake/Alert Behavior During Therapy: WFL for tasks assessed/performed Overall Cognitive Status: Within Functional Limits for tasks assessed                       Extremity/Trunk Assessment               Exercises     Shoulder Instructions       General Comments      Pertinent Vitals/ Pain       Pain Assessment: No/denies pain; HR up to 141 in  session. O2 in 80s at end of session on around 2.5L of O2 (used O2 in session). O2 trended up to 90s and HR stable at end of session.  Home Living                                          Prior Functioning/Environment              Frequency Min 2X/week     Progress Toward Goals  OT Goals(current goals can now be found in the care plan section)  Progress towards OT goals: Progressing toward goals  Acute Rehab OT Goals Patient Stated Goal: not stated; wife wants pt to be able to wash himself OT Goal Formulation: With patient/family Time For Goal Achievement: 02/18/15 Potential to Achieve Goals: Good ADL Goals Pt  Will Perform Lower Body Bathing: with modified independence;sit to/from stand Pt Will Perform Lower Body Dressing: with modified independence;sit to/from stand;with adaptive equipment Additional ADL Goal #1: Pt will walk to bathroom and toilet with 3:1 over commode with mod I. Additional ADL Goal #2: Pt will state 3 energy conservation techniques he could use at home to attempt save energy during adls with no assist. Additional ADL Goal #3: Pt will independently utilize energy conservation techniques during ADLs/functional activities.  Plan Discharge plan remains appropriate    Co-evaluation                 End of Session Equipment Utilized During Treatment: Gait belt;Rolling walker;Oxygen   Activity Tolerance Other (comment) (increase HR)   Patient Left in bed;with call bell/phone within reach;with family/visitor present   Nurse Communication Other (comment) (HR )        Time: [id number] OT Time Calculation (min): 34 min  Charges: OT General Charges $OT Visit: 1 Procedure OT Treatments $Self Care/Home Management : 23-37 mins  Benito Mccreedy OTR/L [id number] 02/10/2015, 12:39 PM

## 2015-02-10 NOTE — Progress Notes (Signed)
Patient ID: Travis [name], male   DOB: 1940-09-22, 74 y.o.   MRN: [id number]  [facility] Progress Note    Assessment/ Plan:   1. 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 He has empirically been started on plasmapheresis status post stress dose corticosteroids. Serologies for acute glomerulonephritis are negative. Unfortunately, prognosis at this time is guarded given her limited ability to treat his TMA without a definitive underlying condition. Appreciate input from palliative care with regards to establishing goals of care. Unfortunately, difficult to ascertain for any renal recovery while on plasmapheresis as this would affect labs to a certain degree. Clinically, he does not have any acute dialysis needs at this time. He is hypervolemic on physical exam and I will challenge him with some furosemide. 2. 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 Hypertension: Blood pressures remain elevated, on monotherapy with hydralazine-increased to 25 mg 3 times a day 4. Right lower extremity DVT: Anticoagulation discontinued due to perinephric hemorrhage status post kidney biopsy. Status post IVC filter   Subjective:   Reports to be feeling fair-denies any chest pain or shortness of breath. Denies any nausea, vomiting or dysgeusia.    Objective:   BP 153/81 mmHg  Pulse 93  Temp(Src) 98.5 F (36.9 C) (Oral)  Resp 18  Ht 5\' 10"  (1.778 m)  Wt 96.8 kg (213 lb 6.5 oz)  BMI 30.62 kg/m2  SpO2 95%  Intake/Output Summary (Last 24 hours) at 02/10/15 0809 Last data filed at 02/10/15 0427  Gross per 24 hour  Intake    240 ml  Output   1200 ml  Net   -960 ml   Weight change: -6.2 kg (-13 lb 10.7 oz)  Physical Exam: Gen: Comfortably resting in bed, watching television CVS: Pulse regular in  rate and rhythm, S1 and S2 normal Resp: Occasional expiratory wheeze otherwise clear to auscultation Abd: Soft, obese, nontender Ext: 2+ lower extremity edema  Imaging: No results found.  Labs: BMET  Recent Labs Lab 02/04/15 0508 02/05/15 0514 02/06/15 0439 02/07/15 0429 02/08/15 0247 02/09/15 0331 02/10/15 0414  NA 136 136 136 134* 134* 138 137  K 4.2 3.7 3.8 4.0 4.1 3.6 3.5  CL 105 104 105 105 103 102 101  CO2 22 20* 21* 20* 22 25 25   GLUCOSE 150* 120* 120* 137* 118* 118* 109*  BUN 54* 64* 71* 77* 80* 78* 72*  CREATININE 4.58* 4.81* 5.35* 5.82* 6.18* 6.43* 6.38*  CALCIUM 8.1* 8.1* 8.0* 7.9* 8.0* 8.1* 8.1*  PHOS 6.1* 5.6* 6.2* 6.5* 6.7* 6.4* 5.9*   CBC  Recent Labs Lab 02/07/15 0429 02/08/15 0247 02/09/15 1600 02/10/15 0414  WBC 24.6* 17.9* 17.5* 16.8*  HGB 6.7* 8.4* 8.2* 7.3*  HCT 21.7* 25.9* 25.4* 23.4*  MCV 98.2 92.8 96.9 99.2  PLT 75* 89* 67* 81*    Medications:    . cyanocobalamin  500 mcg Oral Daily  . docusate sodium  100 mg Oral BID  . feeding supplement (NEPRO CARB STEADY)  237 mL Oral BID BM  . ferrous sulfate  325 mg Oral BID WC  . insulin aspart  0-9 Units Subcutaneous TID WC  . pantoprazole  40 mg Oral Daily  . polyethylene glycol  17 g Oral Daily  . predniSONE  20 mg Oral Q breakfast   Followed by  . [START ON 02/12/2015] predniSONE  10 mg Oral Q breakfast  . sodium chloride  3 mL Intravenous Q12H   Elmarie Shiley, MD 02/10/2015, 8:09 AM

## 2015-02-10 NOTE — Progress Notes (Signed)
PT Cancellation Note  Patient Details Name: Travis [name] MRN: [id number] DOB: 07-09-1941   Cancelled Treatment:    Reason Eval/Treat Not Completed: Patient at procedure or test/unavailable, about to ambulate with pt when RN arrived to take him for plasma exchange. Will check back as time allows.    [place], [place] 02/10/2015, 12:49 PM

## 2015-02-11 LAB — THERAPEUTIC PLASMA EXCHANGE (BLOOD BANK)
Plasma Exchange: 3800
Plasma volume needed: 3800
UNIT DIVISION: 0
UNIT DIVISION: 0
UNIT DIVISION: 0
UNIT DIVISION: 0
UNIT DIVISION: 0
Unit division: 0
Unit division: 0
Unit division: 0
Unit division: 0
Unit division: 0
Unit division: 0
Unit division: 0
Unit division: 0

## 2015-02-11 LAB — FANA STAINING PATTERNS: Speckled Pattern: 1:160 {titer} — ABNORMAL HIGH

## 2015-02-11 LAB — CBC
HEMATOCRIT: 23.2 % — AB (ref 39.0–52.0)
Hemoglobin: 7.3 g/dL — ABNORMAL LOW (ref 13.0–17.0)
MCH: 31.2 pg (ref 26.0–34.0)
MCHC: 31.5 g/dL (ref 30.0–36.0)
MCV: 99.1 fL (ref 78.0–100.0)
Platelets: 89 10*3/uL — ABNORMAL LOW (ref 150–400)
RBC: 2.34 MIL/uL — AB (ref 4.22–5.81)
RDW: 24.9 % — ABNORMAL HIGH (ref 11.5–15.5)
WBC: 15.5 10*3/uL — AB (ref 4.0–10.5)

## 2015-02-11 LAB — GLUCOSE, CAPILLARY
GLUCOSE-CAPILLARY: 102 mg/dL — AB (ref 65–99)
GLUCOSE-CAPILLARY: 147 mg/dL — AB (ref 65–99)
GLUCOSE-CAPILLARY: 157 mg/dL — AB (ref 65–99)
Glucose-Capillary: 115 mg/dL — ABNORMAL HIGH (ref 65–99)
Glucose-Capillary: 119 mg/dL — ABNORMAL HIGH (ref 65–99)

## 2015-02-11 LAB — PROTIME-INR
INR: 1.23 (ref 0.00–1.49)
PROTHROMBIN TIME: 15.6 s — AB (ref 11.6–15.2)

## 2015-02-11 LAB — RENAL FUNCTION PANEL
ALBUMIN: 2.7 g/dL — AB (ref 3.5–5.0)
ANION GAP: 10 (ref 5–15)
BUN: 68 mg/dL — ABNORMAL HIGH (ref 6–20)
CALCIUM: 8 mg/dL — AB (ref 8.9–10.3)
CO2: 29 mmol/L (ref 22–32)
Chloride: 96 mmol/L — ABNORMAL LOW (ref 101–111)
Creatinine, Ser: 6.55 mg/dL — ABNORMAL HIGH (ref 0.61–1.24)
GFR calc non Af Amer: 7 mL/min — ABNORMAL LOW (ref >60)
GFR, EST AFRICAN AMERICAN: 9 mL/min — AB (ref >60)
Glucose, Bld: 101 mg/dL — ABNORMAL HIGH (ref 65–99)
PHOSPHORUS: 5.6 mg/dL — AB (ref 2.5–4.6)
Potassium: 3.3 mmol/L — ABNORMAL LOW (ref 3.5–5.1)
SODIUM: 135 mmol/L (ref 135–145)

## 2015-02-11 LAB — POCT I-STAT, CHEM 8
BUN: 73 mg/dL — AB (ref 6–20)
Calcium, Ion: 1.09 mmol/L — ABNORMAL LOW (ref 1.13–1.30)
Chloride: 92 mmol/L — ABNORMAL LOW (ref 101–111)
Creatinine, Ser: 6.3 mg/dL — ABNORMAL HIGH (ref 0.61–1.24)
Glucose, Bld: 152 mg/dL — ABNORMAL HIGH (ref 65–99)
HCT: 26 % — ABNORMAL LOW (ref 39.0–52.0)
Hemoglobin: 8.8 g/dL — ABNORMAL LOW (ref 13.0–17.0)
POTASSIUM: 4 mmol/L (ref 3.5–5.1)
SODIUM: 135 mmol/L (ref 135–145)
TCO2: 25 mmol/L (ref 0–100)

## 2015-02-11 LAB — ANTINUCLEAR ANTIBODIES, IFA

## 2015-02-11 LAB — ANTI-SCLERODERMA ANTIBODY: SCLERODERMA (SCL-70) (ENA) ANTIBODY, IGG: NEGATIVE

## 2015-02-11 MED ORDER — ACETAMINOPHEN 325 MG PO TABS: 650.0000 mg | ORAL_TABLET | ORAL | Status: DC | PRN

## 2015-02-11 MED ORDER — CALCIUM CARBONATE ANTACID 500 MG PO CHEW
CHEWABLE_TABLET | ORAL | Status: AC
  Filled 2015-02-11: qty 4

## 2015-02-11 MED ORDER — SODIUM CHLORIDE 0.9 % IJ SOLN
10.0000 mL | INTRAMUSCULAR | Status: DC | PRN
  Administered 2015-02-11 – 2015-02-16 (×9): 10 mL
  Filled 2015-02-11 (×8): qty 40

## 2015-02-11 MED ORDER — DIPHENHYDRAMINE HCL 25 MG PO CAPS: 25.0000 mg | ORAL_CAPSULE | Freq: Four times a day (QID) | ORAL | Status: DC | PRN

## 2015-02-11 MED ORDER — ACD FORMULA A 0.73-2.45-2.2 GM/100ML VI SOLN
500.0000 mL | Status: DC
  Administered 2015-02-11: 500 mL via INTRAVENOUS
  Filled 2015-02-11: qty 500

## 2015-02-11 MED ORDER — POTASSIUM CHLORIDE CRYS ER 20 MEQ PO TBCR
20.0000 meq | EXTENDED_RELEASE_TABLET | Freq: Two times a day (BID) | ORAL | Status: AC
Start: 2015-02-11 — End: 2015-02-12
  Administered 2015-02-11 – 2015-02-12 (×3): 20 meq via ORAL
  Filled 2015-02-11 (×3): qty 1

## 2015-02-11 MED ORDER — ACD FORMULA A 0.73-2.45-2.2 GM/100ML VI SOLN
Status: AC
  Filled 2015-02-11: qty 500

## 2015-02-11 MED ORDER — ACD FORMULA A 0.73-2.45-2.2 GM/100ML VI SOLN
Status: AC
  Administered 2015-02-11: 17:00:00
  Filled 2015-02-11: qty 500

## 2015-02-11 MED ORDER — NIFEDIPINE ER 30 MG PO TB24
30.0000 mg | ORAL_TABLET | Freq: Every day | ORAL | Status: DC
  Administered 2015-02-11 – 2015-02-19 (×7): 30 mg via ORAL
  Filled 2015-02-11 (×9): qty 1

## 2015-02-11 MED ORDER — ANTICOAGULANT SODIUM CITRATE 4% (200MG/5ML) IV SOLN
5.0000 mL | Freq: Once | Status: AC
  Administered 2015-02-11: 5 mL
  Filled 2015-02-11: qty 250

## 2015-02-11 MED ORDER — SODIUM CHLORIDE 0.9 % IV SOLN
4.0000 g | Freq: Once | INTRAVENOUS | Status: AC
  Administered 2015-02-11: 4 g via INTRAVENOUS
  Filled 2015-02-11: qty 40

## 2015-02-11 MED ORDER — FUROSEMIDE 10 MG/ML IJ SOLN
40.0000 mg | Freq: Two times a day (BID) | INTRAMUSCULAR | Status: AC
  Administered 2015-02-11 (×2): 40 mg via INTRAVENOUS
  Filled 2015-02-11 (×2): qty 4

## 2015-02-11 MED ORDER — CALCIUM CARBONATE ANTACID 500 MG PO CHEW
2.0000 | CHEWABLE_TABLET | ORAL | Status: AC
  Administered 2015-02-11 (×2): 400 mg via ORAL
  Filled 2015-02-11 (×2): qty 2

## 2015-02-11 NOTE — Progress Notes (Signed)
Patient ID: Travis [name], male   DOB: 01-Jul-1941, 74 y.o.   MRN: [id number]  [facility] Progress Note    Assessment/ Plan:   1. 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 He has empirically been started on plasmapheresis status post stress dose corticosteroids. Serologies for acute glomerulonephritis are negative. Prognosis remains guarded with regards to renal recovery-will discuss utility of eculizumab with the rest of the group. Clinically, he does not have any acute dialysis needs at this time. He remains hypervolemic and I'll retry furosemide and replace potassium. 2. 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 Hypertension: Blood pressures remain elevated, on monotherapy with hydralazine 25 mg 3 times a day-add nifedipine XL 30 mg daily 4. Right lower extremity DVT: Anticoagulation discontinued due to perinephric hemorrhage status post kidney biopsy. Status post IVC filter 5. Hypokalemia: Secondary to plasmapheresis/dietary restriction and diabetic use-replace the oral route  Subjective:   No acute events overnight-had a comfortable night of rest and denies any nausea, vomiting or dysgeusia. He denies any chest pain or shortness of breath.    Objective:   BP 153/77 mmHg  Pulse 92  Temp(Src) 98.9 F (37.2 C) (Oral)  Resp 18  Ht 5\' 10"  (1.778 m)  Wt 95.7 kg (210 lb 15.7 oz)  BMI 30.27 kg/m2  SpO2 99%  Intake/Output Summary (Last 24 hours) at 02/11/15 0813 Last data filed at 02/10/15 2328  Gross per 24 hour  Intake    240 ml  Output   1180 ml  Net   -940 ml   Weight change: 1.3 kg (2 lb 13.9 oz)  Physical Exam: Gen: Comfortably resting in bed and ambulating around room to bathroom CVS: Pulse regular in rate and rhythm, S1 and S2 normal Resp: Clear to auscultation, no rales/rhonchi Abd:  Soft, flat, nontender Ext: 2+ lower extremity edema  Imaging: No results found.  Labs: BMET  Recent Labs Lab 02/05/15 0514 02/06/15 0439 02/07/15 0429 02/08/15 0247 02/08/15 0928 02/09/15 0331 02/10/15 0414 02/10/15 1337 02/11/15 0334  NA 136 136 134* 134* 135 138 137 138 135  K 3.7 3.8 4.0 4.1 3.9 3.6 3.5 3.8 3.3*  CL 104 105 105 103 101 102 101 96* 96*  CO2 20* 21* 20* 22  --  25 25  --  29  GLUCOSE 120* 120* 137* 118* 166* 118* 109* 166* 101*  BUN 64* 71* 77* 80* 88* 78* 72* 79* 68*  CREATININE 4.81* 5.35* 5.82* 6.18* 6.60* 6.43* 6.38* 6.10* 6.55*  CALCIUM 8.1* 8.0* 7.9* 8.0*  --  8.1* 8.1*  --  8.0*  PHOS 5.6* 6.2* 6.5* 6.7*  --  6.4* 5.9*  --  5.6*   CBC  Recent Labs Lab 02/08/15 0247  02/09/15 1600 02/10/15 0414 02/10/15 1337 02/11/15 0334  WBC 17.9*  --  17.5* 16.8*  --  15.5*  HGB 8.4*  < > 8.2* 7.3* 9.2* 7.3*  HCT 25.9*  < > 25.4* 23.4* 27.0* 23.2*  MCV 92.8  --  96.9 99.2  --  99.1  PLT 89*  --  67* 81*  --  89*  < > = values in this interval not displayed.  Medications:    . cyanocobalamin  500 mcg Oral Daily  . docusate sodium  100 mg Oral BID  . feeding supplement (NEPRO CARB STEADY)  237 mL Oral BID BM  . ferrous sulfate  325 mg Oral BID WC  . insulin aspart  0-9 Units Subcutaneous TID WC  . pantoprazole  40 mg Oral Daily  . polyethylene glycol  17 g Oral Daily  . predniSONE  20 mg Oral Q breakfast   Followed by  . [START ON 02/12/2015] predniSONE  10 mg Oral Q breakfast  . sodium chloride  3 mL Intravenous Q12H      Elmarie Shiley, MD 02/11/2015, 8:13 AM

## 2015-02-11 NOTE — Progress Notes (Signed)
Daily Progress Note   Patient Name: Travis [name]       Date: 02/11/2015 DOB: [birth year]  Age: 74 y.o. MRN#: [id number] Attending Physician: Charlynne Cousins, MD Primary Care Physician: [name], MD Admit Date: 01/29/2015  Reason for Consultation/Follow-up: Establishing goals of care  Subjective: Travis [name] and wife is at bedside and are still hopeful for improvement. He tells me that he "does not want to do dialysis" but I do not feel that he is completely turned off to this option. They tell me that they are still hopeful for improvement and are "waiting." I reminded them that I agree they should be hope for the best but also have to plan for the worst - so I encouraged him to consider his options as he has time to contemplate this while receiving plasmapheresis. They are receptive and appreciative of all the care he has received here. I will continue to follow and support.    Length of Stay: 13 days  Current Medications: Scheduled Meds:  . cyanocobalamin  500 mcg Oral Daily  . docusate sodium  100 mg Oral BID  . feeding supplement (NEPRO CARB STEADY)  237 mL Oral BID BM  . ferrous sulfate  325 mg Oral BID WC  . furosemide  40 mg Intravenous BID  . insulin aspart  0-9 Units Subcutaneous TID WC  . NIFEdipine  30 mg Oral Daily  . pantoprazole  40 mg Oral Daily  . polyethylene glycol  17 g Oral Daily  . potassium chloride  20 mEq Oral BID  . [START ON 02/12/2015] predniSONE  10 mg Oral Q breakfast  . sodium chloride  3 mL Intravenous Q12H    Continuous Infusions: . sodium chloride Stopped (02/01/15 1719)  . citrate dextrose      PRN Meds: acetaminophen **OR** acetaminophen, acetaminophen, alum & mag hydroxide-simeth, diphenhydrAMINE, hydrALAZINE, morphine injection, ondansetron, promethazine **OR** promethazine **OR** promethazine  Palliative Performance Scale: 40-50%     Vital Signs: BP 153/77 mmHg  Pulse 92  Temp(Src) 98.9 F (37.2 C) (Oral)  Resp 18  Ht  5\' 10"  (1.778 m)  Wt 95.7 kg (210 lb 15.7 oz)  BMI 30.27 kg/m2  SpO2 99% SpO2: SpO2: 99 % O2 Device: O2 Device: Nasal Cannula O2 Flow Rate: O2 Flow Rate (L/min): 2 L/min  Intake/output summary:  Intake/Output Summary (Last 24 hours) at 02/11/15 0954 Last data filed at 02/10/15 2328  Gross per 24 hour  Intake    240 ml  Output   1180 ml  Net   -940 ml   LBM:  5/29 Baseline Weight: Weight: 97.977 kg (216 lb) Most recent weight: Weight: 95.7 kg (210 lb 15.7 oz)  Physical Exam: Gen: NAD, lying in bed HEENT: [place]/AT, moist mucous membranes, Lt IJ CV: RRR Pulm: No labored breathing            Extremities: BLE 3+ edema Neuro: Awake, oriented x 3, pleasant  Additional Data Reviewed: Recent Labs     02/10/15  0414  02/10/15  1337  02/11/15  0334  WBC  16.8*   --   15.5*  HGB  7.3*  9.2*  7.3*  PLT  81*   --   89*  NA  137  138  135  BUN  72*  79*  68*  CREATININE  6.38*  6.10*  6.55*     Problem List:  Patient Active Problem List   Diagnosis Date Noted  . AKI (acute kidney injury)   . Palliative  care encounter   . Acute on chronic renal failure 01/31/2015  . Acute DVT of right tibial vein 01/29/2015  . Acute kidney injury 01/29/2015  . DVT (deep venous thrombosis) 01/29/2015  . Acute on chronic respiratory failure with hypoxia 01/28/2015  . Physical deconditioning 01/28/2015  . Sjogren's disease 01/28/2015  . Pedal edema 01/28/2015  . Peripheral edema 01/07/2015  . HCAP (healthcare-associated pneumonia) 01/06/2015  . Steroid-induced hyperglycemia 01/06/2015  . Dyspnea   . Pulmonary fibrosis   . Knee pain   . Shoulder pain   . 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 Anasarca 12/17/2014  . Fever 12/17/2014  . Elevated blood pressure 12/17/2014  . Normocytic anemia 12/17/2014     Palliative Care Assessment & Plan    Code Status:  Full code - did not address today  Goals of Care:  Still hopeful for improvement. Very spiritual.   Desire for  further Chaplaincy support:no  3. Symptom Management:  Denies pain, nausea, constipation.  Edema/renal failure: Continue plasmapheresis and management by renal and primary.   4. Palliative Prophylaxis:  Stool Softner: yes  5. Prognosis: Unable to determine  5. Discharge Planning: Unable to say - home when stable   Care plan was discussed with patient, wife, Dr. Olevia Bowens.   Thank you for allowing the Palliative Medicine Team to assist in the care of this patient.   Time In: 0930 Time Out: 0950 Total Time 80min Prolonged Time Billed  no     Greater than 50%  of this time was spent counseling and coordinating care related to the above assessment and plan.   Pershing Proud, NP  [id number], 9:54 AM  Please contact Palliative Medicine Team phone at [phone] for questions and concerns.

## 2015-02-11 NOTE — Care Management (Signed)
Medicare Important Message given? Yes (If Response is "NO", the following Medicare IM given date fields will be blank) Date Medicare IM given: 02/11/15 Medicare IM given by: Elenor Quinones

## 2015-02-11 NOTE — Progress Notes (Signed)
[name] Interim History: 74 y.o. year-old with hx of DJD and GERD, otherwise healthy until April 2016 in hospital twice with pulm infiltrates (pna vs fibrosis), cough, SOB, LE edema. Work up was ANA negative but was + for RF, antiRo and antiLa and for ANCA. He has been treated with some oral prednisone. He was ordered for home O2 by his PCP the week prior to admission. On 5/18 presented to ED with severe fatigue, LE edema. No SOB or CP. Was seen in pulm clinic and dopplers were + for DVT R leg below the knee. Admitted and started on IV heparin. Creat was up, new since last hosp stay, 2.94 on admission and up to 4.58 today. Patient received 3 days of high dose solumedrol and now on 60. Renal on board. Renal biopsy pending (once INR < 1.5). 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 Was found to be in flank pain Hbg drop so heparin stopped, IV filter placed. CT abd  And pelvis showed large  Perinephric hemorrhage. Renal recommended 5 treatment of plasmapheresis to be completed on 6.1.2016. Tapering steroids.   Assessment/Plan: Acute DVT of right tibial vein with intermediate probability VQ scan: - CT scan of the abdomen and pelvis was done that showed Evidence of moderate left perinephric hemorrhage post percutaneous Biopsy. He also had a 2 g drop in hemoglobin - dc'd coumadin and heparin see below, IR was consulted for an IVC filter placement.  Acute on chronic kidney disease stage III: - Renal consulted and recommended renal biopsy, performed on 5.25.2016 by IR. 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 - Pending ANA, centromere and anti-scleroderma 70  antibodies. - COnt IV lasix sluggish diuresis.  Renal hematoma: - CT scan of the abdomen and pelvis showed evidence of moderate-sized perinephric hemorrhage post percutaneous biopsy.  Anticoagulation stopped.  - Hemoglobin has remained stable, will transfuse one unit if  less than 7, continue to monitor.  Elevated blood pressure without a diagnosis of hypertension: - Continue IV hydralazine, his blood pressure is trending high  Steroid-induced hyperglycemia: - Now tapering down steroids. - A1c was 4.9 and continue sliding scale insulin.  Acute on chronic anemia/acute blood loss anemia: - Continue Protonix. - 2 point Drop in hemoglobin,due to renal bleeding. - Hbg has remained stable.  Physical deconditioning: - Consider physical therapy.  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   Consultants:  Renal  IR  Procedures:  Renal biopsy on 5.26.2016  Antibiotics:  None  HPI/Subjective: Left flank pain is improved. No further complaints. Decrease appetite.  Objective: Filed Vitals:   02/10/15 1508 02/10/15 1515 02/10/15 1946 02/11/15 0424  BP: 149/71 147/68 165/83 153/77  Pulse: 86 90 108 92  Temp: 98.6 F (37 C) 98.8 F (37.1 C) 99.1 F (37.3 C) 98.9 F (37.2 C)  TempSrc: Oral Oral Oral Oral  Resp:  19 18 18   Height:      Weight:  97.5 kg (214 lb 15.2 oz)  95.7 kg (210 lb 15.7 oz)  SpO2:  100% 92% 99%    Intake/Output Summary (Last 24 hours) at 02/11/15 0934 Last data filed at 02/10/15 2328  Gross per 24 hour  Intake    240 ml  Output   1180  ml  Net   -940 ml   Filed Weights   02/10/15 1314 02/10/15 1515 02/11/15 0424  Weight: 97.7 kg (215 lb 6.2 oz) 97.5 kg (214 lb 15.2 oz) 95.7 kg (210 lb 15.7 oz)    Exam:  General: Alert, awake, oriented x3, in no acute distress.  HEENT: No bruits, no goiter.  Heart: Regular rate and rhythm. Lungs: Good air movement, clear.  Abdomen: Soft, nontender, nondistended, positive bowel sounds.   Neuro: Grossly intact, nonfocal.   Data Reviewed: Basic Metabolic Panel:  Recent Labs Lab 02/07/15 0429 02/08/15 0247 02/08/15 0928 02/09/15 0331 02/10/15 0414 02/10/15 1337 02/11/15 0334  NA 134* 134* 135 138 137 138 135  K 4.0 4.1 3.9 3.6 3.5 3.8 3.3*  CL 105 103 101 102 101 96* 96*  CO2 20* 22  --  25 25  --  29  GLUCOSE 137* 118* 166* 118* 109* 166* 101*  BUN 77* 80* 88* 78* 72* 79* 68*  CREATININE 5.82* 6.18* 6.60* 6.43* 6.38* 6.10* 6.55*  CALCIUM 7.9* 8.0*  --  8.1* 8.1*  --  8.0*  PHOS 6.5* 6.7*  --  6.4* 5.9*  --  5.6*   Liver Function Tests:  Recent Labs Lab 02/07/15 0429 02/08/15 0247 02/09/15 0331 02/10/15 0414 02/11/15 0334  ALBUMIN 2.2* 2.3* 2.5* 2.5* 2.7*   No results for input(s): LIPASE, AMYLASE in the last 168 hours. No results for input(s): AMMONIA in the last 168 hours. CBC:  Recent Labs Lab 02/07/15 0429 02/08/15 0247 02/08/15 0928 02/09/15 1600 02/10/15 0414 02/10/15 1337 02/11/15 0334  WBC 24.6* 17.9*  --  17.5* 16.8*  --  15.5*  HGB 6.7* 8.4* 7.8* 8.2* 7.3* 9.2* 7.3*  HCT 21.7* 25.9* 23.0* 25.4* 23.4* 27.0* 23.2*  MCV 98.2 92.8  --  96.9 99.2  --  99.1  PLT 75* 89*  --  67* 81*  --  89*   Cardiac Enzymes: No results for input(s): CKTOTAL, CKMB, CKMBINDEX, TROPONINI in the last 168 hours. BNP (last 3 results)  Recent Labs  12/17/14 1934 01/02/15 1241 01/06/15 1758  BNP 38.6 66.6 83.3    ProBNP (last 3 results) No results for input(s): PROBNP in the last 8760 hours.  CBG:  Recent Labs Lab 02/10/15 0623 02/10/15 1200 02/10/15 1634 02/10/15 2101 02/11/15 0601  GLUCAP 120* 152* 132* 111* 102*    No results found for this or any previous visit (from the past 240 hour(s)).   Studies: No results found.  Scheduled Meds: . cyanocobalamin  500 mcg Oral Daily  . docusate sodium  100 mg Oral BID  . feeding supplement (NEPRO CARB STEADY)  237 mL Oral BID BM  . ferrous sulfate  325 mg Oral BID WC  . insulin aspart   0-9 Units Subcutaneous TID WC  . NIFEdipine  30 mg Oral Daily  . pantoprazole  40 mg Oral Daily  . polyethylene glycol  17 g Oral Daily  . potassium chloride  20 mEq Oral BID  . [START ON 02/12/2015] predniSONE  10 mg Oral Q breakfast  . sodium chloride  3 mL Intravenous Q12H   Continuous Infusions: . sodium chloride Stopped (02/01/15 1719)  . citrate dextrose      Time Spent: 15 min   Charlynne Cousins  [facility] Pager [phone]. If 7PM-7AM, please contact night-coverage at [email or web address], password [facility] 02/11/2015, 9:34 AM  LOS: 13 days

## 2015-02-11 NOTE — Progress Notes (Signed)
UR Completed. Monserath Neff, RN, BSN.  [phone] 

## 2015-02-11 NOTE — Progress Notes (Signed)
Physical Therapy Treatment Patient Details Name: Travis [name] MRN: [id number] DOB: 09-04-1941 Today's Date: 02/11/2015    History of Present Illness 74 y.o. male with PMHx of Arthritis, GERD, CKD, Sjogrens who was admitted 5/18 with complaint of fatigue and lower extremity edema. 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 His current hospital stay is most notable for AKI. He was also found to have DVT for which he was anticoagulated. His renal function has worsened here which lead to renal bx revealing thrombotic microangiopathy. After renal bx, he had bleeding (perinephretic hematoma) which forced discontinuation of anticoagulants and IVC filter placement. He has been treated with high dose steroids, and given biopsy results recently started on trial of plasmapheresis. 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     PT Comments    Pt tolerating activity better, although continues to need cues to slow velocity and take standing rest breaks (HR up to 134 bpm and SaO2 decr to 86% on 2L--returns to 90% in <60 seconds with pursed lip breathing). Perhaps initiate stair training next visit (likely roll in chair to steps to conserve energy).    Follow Up Recommendations  Supervision - Intermittent;Home health PT     Equipment Recommendations  None recommended by PT    Recommendations for Other Services       Precautions / Restrictions Precautions Precautions: Fall Precaution Comments: monitor O2 sats and HR Restrictions Weight Bearing Restrictions: No    Mobility  Bed Mobility Overal bed mobility: Modified Independent Bed Mobility: Supine to Sit     Supine to sit: Modified independent (Device/Increase time)        Transfers Overall transfer level: Modified  independent Equipment used: None Transfers: Sit to/from Stand Sit to Stand: Supervision         General transfer comment: supervision for safety as pt stood without RW present; very steady  Ambulation/Gait Ambulation/Gait assistance: Supervision Ambulation Distance (Feet): 250 Feet Assistive device: Rolling walker (2 wheeled) Gait Pattern/deviations: WFL(Within Functional Limits) Gait velocity: too fast for his cardiopulmonary status   General Gait Details: Good use of RW to decr the work of walking; cues to self-monitor for activity tolerance--pt poorly self-regulates and required cues for: standing rest breaks (due to incr HR and decr SaO2), for pursed lip breathing, and to slow velocity   Stairs            Wheelchair Mobility    Modified Rankin (Stroke Patients Only)       Balance           Standing balance support: No upper extremity supported Standing balance-Leahy Scale: Fair                      Cognition Arousal/Alertness: Awake/alert Behavior During Therapy: WFL for tasks assessed/performed Overall Cognitive Status: Within Functional Limits for tasks assessed                      Exercises General Exercises - Lower Extremity Ankle Circles/Pumps: AROM;Both;20 reps;Seated Long Arc Quad: AROM;Both;10 reps;Seated Hip Flexion/Marching: AROM;Both;5 reps;Seated Other Exercises Other Exercises: reviewed bridging on handout and verbally (to be done when supine). Pt/wife report he has not been doing exercises as he prefers to walk. Agreed walking was great exercise and these additional exercises are good to do  between walks.     General Comments        Pertinent Vitals/Pain Pain Assessment: No/denies pain    Home Living                      Prior Function            PT Goals (current goals can now be found in the care plan section) Acute Rehab PT Goals Patient Stated Goal: to go home PT Goal Formulation: With  patient Time For Goal Achievement: 02/18/15 Potential to Achieve Goals: Good Progress towards PT goals: Goals met and updated - see care plan (most goals met)    Frequency  Min 3X/week    PT Plan Current plan remains appropriate    Co-evaluation             End of Session Equipment Utilized During Treatment: Oxygen Activity Tolerance: Patient limited by fatigue Patient left: in chair;with call bell/phone within reach;with family/visitor present     Time: 2527-1292 PT Time Calculation (min) (ACUTE ONLY): 19 min  Charges:  $Therapeutic Exercise: 8-22 mins                    G Codes:      Zakaria Fromer February 13, 2015, 12:08 PM  Pager [phone]

## 2015-02-12 DIAGNOSIS — N189 Chronic kidney disease, unspecified: Secondary | ICD-10-CM

## 2015-02-12 DIAGNOSIS — N179 Acute kidney failure, unspecified: Secondary | ICD-10-CM | POA: Insufficient documentation

## 2015-02-12 DIAGNOSIS — N186 End stage renal disease: Secondary | ICD-10-CM

## 2015-02-12 DIAGNOSIS — I82441 Acute embolism and thrombosis of right tibial vein: Principal | ICD-10-CM

## 2015-02-12 DIAGNOSIS — Z992 Dependence on renal dialysis: Secondary | ICD-10-CM

## 2015-02-12 HISTORY — DX: End stage renal disease: N18.6

## 2015-02-12 HISTORY — DX: End stage renal disease: Z99.2

## 2015-02-12 LAB — THERAPEUTIC PLASMA EXCHANGE (BLOOD BANK)
Plasma volume needed: 3800
UNIT DIVISION: 0
UNIT DIVISION: 0
UNIT DIVISION: 0
UNIT DIVISION: 0
Unit division: 0
Unit division: 0
Unit division: 0
Unit division: 0
Unit division: 0
Unit division: 0
Unit division: 0
Unit division: 0
Unit division: 0

## 2015-02-12 LAB — CBC
HCT: 23.2 % — ABNORMAL LOW (ref 39.0–52.0)
HEMOGLOBIN: 7.2 g/dL — AB (ref 13.0–17.0)
MCH: 31.3 pg (ref 26.0–34.0)
MCHC: 31 g/dL (ref 30.0–36.0)
MCV: 100.9 fL — ABNORMAL HIGH (ref 78.0–100.0)
Platelets: 94 10*3/uL — ABNORMAL LOW (ref 150–400)
RBC: 2.3 MIL/uL — ABNORMAL LOW (ref 4.22–5.81)
RDW: 23.6 % — AB (ref 11.5–15.5)
WBC: 16 10*3/uL — ABNORMAL HIGH (ref 4.0–10.5)

## 2015-02-12 LAB — RENAL FUNCTION PANEL
ANION GAP: 12 (ref 5–15)
Albumin: 2.7 g/dL — ABNORMAL LOW (ref 3.5–5.0)
BUN: 64 mg/dL — AB (ref 6–20)
CO2: 32 mmol/L (ref 22–32)
Calcium: 8.3 mg/dL — ABNORMAL LOW (ref 8.9–10.3)
Chloride: 93 mmol/L — ABNORMAL LOW (ref 101–111)
Creatinine, Ser: 6.81 mg/dL — ABNORMAL HIGH (ref 0.61–1.24)
GFR calc Af Amer: 8 mL/min — ABNORMAL LOW (ref >60)
GFR calc non Af Amer: 7 mL/min — ABNORMAL LOW (ref >60)
GLUCOSE: 102 mg/dL — AB (ref 65–99)
Phosphorus: 5.7 mg/dL — ABNORMAL HIGH (ref 2.5–4.6)
Potassium: 3.7 mmol/L (ref 3.5–5.1)
SODIUM: 137 mmol/L (ref 135–145)

## 2015-02-12 LAB — GLUCOSE, CAPILLARY
GLUCOSE-CAPILLARY: 331 mg/dL — AB (ref 65–99)
GLUCOSE-CAPILLARY: 122 mg/dL — AB (ref 65–99)
Glucose-Capillary: 103 mg/dL — ABNORMAL HIGH (ref 65–99)
Glucose-Capillary: 113 mg/dL — ABNORMAL HIGH (ref 65–99)
Glucose-Capillary: 114 mg/dL — ABNORMAL HIGH (ref 65–99)

## 2015-02-12 MED ORDER — SODIUM CHLORIDE 0.9 % IV SOLN
4.0000 g | Freq: Once | INTRAVENOUS | Status: DC
  Filled 2015-02-12 (×4): qty 40

## 2015-02-12 MED ORDER — FUROSEMIDE 10 MG/ML IJ SOLN
40.0000 mg | Freq: Two times a day (BID) | INTRAMUSCULAR | Status: AC
  Administered 2015-02-12 – 2015-02-13 (×4): 40 mg via INTRAVENOUS
  Filled 2015-02-12 (×3): qty 4

## 2015-02-12 MED ORDER — CALCIUM CARBONATE ANTACID 500 MG PO CHEW
CHEWABLE_TABLET | ORAL | Status: AC
  Filled 2015-02-12: qty 2

## 2015-02-12 MED ORDER — CALCIUM CARBONATE ANTACID 500 MG PO CHEW
2.0000 | CHEWABLE_TABLET | ORAL | Status: AC
  Administered 2015-02-12: 400 mg via ORAL
  Filled 2015-02-12 (×2): qty 2

## 2015-02-12 MED ORDER — ACD FORMULA A 0.73-2.45-2.2 GM/100ML VI SOLN
500.0000 mL | Status: DC
  Administered 2015-02-12: 500 mL via INTRAVENOUS
  Filled 2015-02-12 (×2): qty 500

## 2015-02-12 MED ORDER — ANTICOAGULANT SODIUM CITRATE 4% (200MG/5ML) IV SOLN
5.0000 mL | Freq: Once | Status: AC
  Administered 2015-02-12: 5 mL
  Filled 2015-02-12 (×3): qty 250

## 2015-02-12 MED ORDER — DIPHENHYDRAMINE HCL 25 MG PO CAPS
ORAL_CAPSULE | ORAL | Status: AC
  Filled 2015-02-12: qty 1

## 2015-02-12 MED ORDER — DIPHENHYDRAMINE HCL 25 MG PO CAPS
25.0000 mg | ORAL_CAPSULE | Freq: Four times a day (QID) | ORAL | Status: DC | PRN
  Administered 2015-02-12: 25 mg via ORAL

## 2015-02-12 MED ORDER — ACD FORMULA A 0.73-2.45-2.2 GM/100ML VI SOLN
Status: AC
  Filled 2015-02-12: qty 1000

## 2015-02-12 MED ORDER — ACETAMINOPHEN 325 MG PO TABS: 650.0000 mg | ORAL_TABLET | ORAL | Status: DC | PRN

## 2015-02-12 NOTE — Progress Notes (Addendum)
Occupational Therapy Treatment Patient Details Name: Travis [name] MRN: [id number] DOB: [birth year] Today's Date: 02/12/2015    History of present illness 74 y.o. male with PMHx of Arthritis, GERD, CKD, Sjogrens who was admitted 5/18 with complaint of fatigue and lower extremity edema. 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 His current hospital stay is most notable for AKI. He was also found to have DVT for which he was anticoagulated. His renal function has worsened here which lead to renal bx revealing thrombotic microangiopathy. After renal bx, he had bleeding (perinephretic hematoma) which forced discontinuation of anticoagulants and IVC filter placement. He has been treated with high dose steroids, and given biopsy results recently started on trial of plasmapheresis. 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    OT comments  Pt progressing. Feel pt will continue to benefit from acute OT to increase activity tolerance/endurance prior to d/c.   Follow Up Recommendations  No OT follow up;Supervision - Intermittent    Equipment Recommendations  None recommended by OT    Recommendations for Other Services      Precautions / Restrictions Precautions Precautions: Fall Precaution Comments: monitor O2 sats and HR Restrictions Weight Bearing Restrictions: No       Mobility Bed Mobility Overal bed mobility: Modified Independent                Transfers Overall transfer level: Needs assistance   Transfers: Sit to/from Stand Sit to Stand: Supervision         General transfer comment: cues for hand placement/supervision for safety; pt sitting down with both hands on walker.    Balance  Supervision for ambulation with RW.                                  ADL Overall ADL's : Needs assistance/impaired     Grooming: Sitting;Standing;Set up;Supervision/safety;Wash/dry face;Oral care;Applying deodorant (applied lotion)   Upper Body Bathing: Supervision/ safety;Set up;Sitting;Standing Upper Body Bathing Details (indicate cue type and reason): OT washed/dryed his back Lower Body Bathing: Set up;Supervison/ safety;Sit to/from stand   Upper Body Dressing : Supervision/safety;Set up;Standing;Sitting Upper Body Dressing Details (indicate cue type and reason): OT assisted with gown but feel pt could manage a shirt. Lower Body Dressing: Supervision/safety;Sitting/lateral leans (doffed socks)   Toilet Transfer: Supervision/safety;RW;Ambulation (chair/bed)           Functional mobility during ADLs: Supervision/safety;Rolling walker General ADL Comments: Pt seemed discouraged as he reported MD had negative news. OT tried to encourage pt. He was agreeable to OT session-educated on energy conservation techniques and gave cues to take breaks in session. Pt able to state a couple energy conservation tips that OT went over last session. Educated on safety such as sitting for washing legs/feet.  Explained benefit of therapy/activity. Showed and explained how wife can see his HR when she is with him.      Vision                     Perception     Praxis      Cognition  Awake/Alert Behavior During Therapy: WFL for tasks assessed/performed Overall Cognitive Status: Within Functional Limits for tasks assessed  Extremity/Trunk Assessment               Exercises     Shoulder Instructions       General Comments      Pertinent Vitals/ Pain       Pain Assessment: No/denies pain; HR up to high 130s in session. Cues to take breaks and deep breathing technique. Notified nurse of increased HR. HR and O2 steady at end of session. Pt used a little of 2L of O2 in session (read in 80s when pt back in bed at  end of session-unsure of accuracy-trended up to 90s).   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 Additional ADL Goal #2: Pt will state 3 energy conservation techniques he could use at home to attempt save energy during adls with no assist. Additional ADL Goal #3: Pt will independently utilize energy conservation techniques during ADLs/functional activities.  Plan Discharge plan remains appropriate    Co-evaluation                 End of Session Equipment Utilized During Treatment: Oxygen;Rolling walker   Activity Tolerance Patient tolerated treatment well;Patient limited by fatigue;Other (comment) (increased HR/took breaks)   Patient Left in bed;with call bell/phone within reach   Nurse Communication Mobility status;Other (comment) (HR)        Time: [id number] OT Time Calculation (min): 33 min  Charges: OT General Charges $OT Visit: 1 Procedure OT Treatments $Self Care/Home Management : 23-37 mins  Benito Mccreedy OTR/L [id number] 02/12/2015, 10:12 AM

## 2015-02-12 NOTE — Care Management Note (Signed)
Case Management Note  Patient Details  Name: Travis [name] MRN: [id number] Date of Birth: [birth year]  Subjective/Objective:                    Action/Plan:   Expected Discharge Date:                  Expected Discharge Plan:  [place]  In-House Referral:     Discharge planning Services  CM Consult  Post Acute Care Choice:  Resumption of Svcs/PTA Provider Choice offered to:     DME Arranged:    DME Agency:     HH Arranged:  RN, PT [place] Agency:  [facility]  Status of Service:     Medicare Important Message Given:  Yes Date Medicare IM Given:  01/30/15 Medicare IM give by:  Elenor Quinones Date Additional Medicare IM Given:  02/03/15 Additional Medicare Important Message give by:  Marvetta Gibbons   If discussed at [employer] of Stay Meetings, dates discussed:  02/11/2015  Additional Comments:  Delrae Sawyers, RN 02/12/2015, 9:14 AM

## 2015-02-12 NOTE — Progress Notes (Signed)
[name] Interim History: 74 y.o. year-old with hx of DJD and GERD, otherwise healthy until April 2016 in hospital twice with pulm infiltrates (pna vs fibrosis), cough, SOB, LE edema. Work up was ANA negative but was + for RF, antiRo and antiLa and for ANCA. He has been treated with some oral prednisone. He was ordered for home O2 by his PCP the week prior to admission. On 5/18 presented to ED with severe fatigue, LE edema. No SOB or CP. Was seen in pulm clinic and dopplers were + for DVT R leg below the knee. Admitted and started on IV heparin. Creat was up, new since last hosp stay. Patient received 3 days of high dose solumedrol and now on 60. Renal on board. Renal biopsy shows thrombotic microangiopathy that at this time appears that it might be related to hypertensive emergency versus atypical HUS. Was found to be in flank pain Hbg drop so heparin stopped, IV filter placed. CT abd pelvis showed large perinephric hemorrhage. Currently undergoing plasmapheresis and prednisone.  ` HPI/Subjective: - appears sad this morning, crying, he appears to have difficulties coping with 3 recent hospitalizations - no chest pain, shortness of breath, no abdominal pain, nausea or vomiting.   Assessment/Plan:  Acute on chronic kidney disease stage III: - Renal consulted and patient underwent a renal biopsy on 5.25.2016 by IR. 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 He has empirically been started on plasmapheresis, and tapering down steroids, to get 7 treatments total - Pending ANA. Centromere negative. Anti-scleroderma 70 antibodies negative. - Cont IV lasix per nephrology - may need HD  Renal hematoma: - CT scan of the abdomen and pelvis showed evidence of moderate-sized perinephric hemorrhage post percutaneous biopsy. Anticoagulation stopped and IVC filter was placed by IR. - Hemoglobin has remained stable, will  transfuse one unit if less than 7, continue to monitor.  Acute DVT of right tibial vein with intermediate probability VQ scan: - CT scan of the abdomen and pelvis was done that showed evidence of moderate left perinephric hemorrhage post percutaneous Biopsy.  - dc'd coumadin and heparin, IR was consulted for an IVC filter placement.  Elevated blood pressure without a diagnosis of hypertension: - Continue IV hydralazine  Steroid-induced hyperglycemia: - Now tapering down steroids. - A1c was 4.9 and continue sliding scale insulin.  Acute on chronic anemia/acute blood loss anemia: - Continue Protonix.  Physical deconditioning: - Consider physical therapy.  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   Consultants:  Renal  IR  Procedures:  Renal biopsy on 5.26.2016  Antibiotics:  None  Objective: Filed Vitals:   02/11/15 1801 02/11/15 1810 02/11/15 2020 02/12/15 0552  BP: 141/71 143/68 153/71 148/74  Pulse: 97 97 97 92  Temp: 98.9 F (37.2 C) 98.8 F (37.1 C) 98.4 F (36.9 C) 98.5 F (36.9 C)  TempSrc: Oral Oral Oral Oral  Resp:   18 18  Height:      Weight:    95.9 kg (211 lb 6.7 oz)  SpO2:  96% 96% 96%    Intake/Output Summary (Last 24 hours) at 02/12/15 1332 Last data filed at 02/12/15 0830  Gross per 24 hour  Intake    240 ml  Output    400 ml  Net   -160 ml   Filed Weights   02/11/15 0424 02/11/15 1600 02/12/15 0552  Weight: 95.7 kg (210 lb 15.7 oz) 96.2 kg (212 lb 1.3 oz) 95.9  kg (211 lb 6.7 oz)    Exam: General: Alert, awake, oriented x3, in no acute distress.  HEENT: No bruits, no goiter.  Heart: Regular rate and rhythm, no MRG, no JVD, 1-2 + edema Lungs: Good air movement, clear.  Abdomen: Soft, nontender, nondistended, positive bowel sounds.  Neuro: Grossly intact, nonfocal. Skin: no rashes Psych: depressed  Data Reviewed: Basic Metabolic Panel:  Recent Labs Lab 02/08/15 0247  02/09/15 0331 02/10/15 0414  02/10/15 1337 02/11/15 0334 02/11/15 1611 02/12/15 0500  NA 134*  < > 138 137 138 135 135 137  K 4.1  < > 3.6 3.5 3.8 3.3* 4.0 3.7  CL 103  < > 102 101 96* 96* 92* 93*  CO2 22  --  25 25  --  29  --  32  GLUCOSE 118*  < > 118* 109* 166* 101* 152* 102*  BUN 80*  < > 78* 72* 79* 68* 73* 64*  CREATININE 6.18*  < > 6.43* 6.38* 6.10* 6.55* 6.30* 6.81*  CALCIUM 8.0*  --  8.1* 8.1*  --  8.0*  --  8.3*  PHOS 6.7*  --  6.4* 5.9*  --  5.6*  --  5.7*  < > = values in this interval not displayed. Liver Function Tests:  Recent Labs Lab 02/08/15 0247 02/09/15 0331 02/10/15 0414 02/11/15 0334 02/12/15 0500  ALBUMIN 2.3* 2.5* 2.5* 2.7* 2.7*   CBC:  Recent Labs Lab 02/08/15 0247  02/09/15 1600 02/10/15 0414 02/10/15 1337 02/11/15 0334 02/11/15 1611 02/12/15 0500  WBC 17.9*  --  17.5* 16.8*  --  15.5*  --  16.0*  HGB 8.4*  < > 8.2* 7.3* 9.2* 7.3* 8.8* 7.2*  HCT 25.9*  < > 25.4* 23.4* 27.0* 23.2* 26.0* 23.2*  MCV 92.8  --  96.9 99.2  --  99.1  --  100.9*  PLT 89*  --  67* 81*  --  89*  --  94*  < > = values in this interval not displayed.  BNP (last 3 results)  Recent Labs  12/17/14 1934 01/02/15 1241 01/06/15 1758  BNP 38.6 66.6 83.3   CBG:  Recent Labs Lab 02/11/15 1840 02/11/15 2015 02/12/15 0554 02/12/15 1158 02/12/15 1159  GLUCAP 147* 157* 103* 331* 113*   Studies: No results found.  Scheduled Meds: . cyanocobalamin  500 mcg Oral Daily  . docusate sodium  100 mg Oral BID  . feeding supplement (NEPRO CARB STEADY)  237 mL Oral BID BM  . ferrous sulfate  325 mg Oral BID WC  . furosemide  40 mg Intravenous BID  . insulin aspart  0-9 Units Subcutaneous TID WC  . NIFEdipine  30 mg Oral Daily  . pantoprazole  40 mg Oral Daily  . polyethylene glycol  17 g Oral Daily  . predniSONE  10 mg Oral Q breakfast  . sodium chloride  3 mL Intravenous Q12H   Continuous Infusions: . sodium chloride Stopped (02/01/15 1719)    Time Spent: 35 min for chart review  including his previous 2 hospitalizations, patient exam and counseling   Marzetta Board  [facility] Pager [phone]. If 7PM-7AM, please contact night-coverage at [email or web address], password [facility] 02/12/2015, 1:32 PM  LOS: 14 days

## 2015-02-12 NOTE — Progress Notes (Signed)
Patient ID: Pearce Herbin, male   DOB: Feb 06, 1941, 74 y.o.   MRN: [id number]  [facility] Progress Note    Assessment/ Plan:   1. 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 He has empirically been started on plasmapheresis status post stress dose corticosteroids. Serologies for acute glomerulonephritis are negative. Renal prognosis unfortunately remains poor with inability to successfully treat his underlying TMA-discussed situation with hematology yesterday who recommend prolonging the treatments of plasmapheresis to at least 7 days. No utility of eculizumab. 2. 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 Hypertension: Improving blood pressures on the current regimen of hydralazine and nifedipine XL, with persistent edema, I will re-dose furosemide 4. Right lower extremity DVT: Anticoagulation discontinued due to perinephric hemorrhage status post kidney biopsy. Status post IVC filter  Subjective:   No acute events overnight-tolerated plasmapheresis without problems    Objective:   BP 148/74 mmHg  Pulse 92  Temp(Src) 98.5 F (36.9 C) (Oral)  Resp 18  Ht 5\' 10"  (1.778 m)  Wt 95.9 kg (211 lb 6.7 oz)  BMI 30.34 kg/m2  SpO2 96%  Intake/Output Summary (Last 24 hours) at 02/12/15 0841 Last data filed at 02/11/15 2004  Gross per 24 hour  Intake    600 ml  Output   1100 ml  Net   -500 ml   Weight change: -1.5 kg (-3 lb 4.9 oz)  Physical Exam: Gen: Comfortably resting in bed, eating breakfast CVS: Pulse regular in rate and rhythm, S1 and S2 normal Resp: Clear to auscultation, no rales Abd: Soft, flat, nontender Ext: 2+ lower extremity edema  Imaging: No results found.  Labs: BMET  Recent Labs Lab 02/06/15 0439 02/07/15 0429 02/08/15 0247 02/08/15 0928 02/09/15 0331 02/10/15 0414  02/10/15 1337 02/11/15 0334 02/11/15 1611 02/12/15 0500  NA 136 134* 134* 135 138 137 138 135 135 137  K 3.8 4.0 4.1 3.9 3.6 3.5 3.8 3.3* 4.0 3.7  CL 105 105 103 101 102 101 96* 96* 92* 93*  CO2 21* 20* 22  --  25 25  --  29  --  32  GLUCOSE 120* 137* 118* 166* 118* 109* 166* 101* 152* 102*  BUN 71* 77* 80* 88* 78* 72* 79* 68* 73* 64*  CREATININE 5.35* 5.82* 6.18* 6.60* 6.43* 6.38* 6.10* 6.55* 6.30* 6.81*  CALCIUM 8.0* 7.9* 8.0*  --  8.1* 8.1*  --  8.0*  --  8.3*  PHOS 6.2* 6.5* 6.7*  --  6.4* 5.9*  --  5.6*  --  5.7*   CBC  Recent Labs Lab 02/09/15 1600 02/10/15 0414 02/10/15 1337 02/11/15 0334 02/11/15 1611 02/12/15 0500  WBC 17.5* 16.8*  --  15.5*  --  16.0*  HGB 8.2* 7.3* 9.2* 7.3* 8.8* 7.2*  HCT 25.4* 23.4* 27.0* 23.2* 26.0* 23.2*  MCV 96.9 99.2  --  99.1  --  100.9*  PLT 67* 81*  --  89*  --  94*    Medications:    . cyanocobalamin  500 mcg Oral Daily  . docusate sodium  100 mg Oral BID  . feeding supplement (NEPRO CARB STEADY)  237 mL Oral BID BM  . ferrous sulfate  325 mg Oral BID WC  . insulin aspart  0-9 Units Subcutaneous TID WC  . NIFEdipine  30 mg Oral Daily  . pantoprazole  40 mg Oral Daily  . polyethylene  glycol  17 g Oral Daily  . potassium chloride  20 mEq Oral BID  . predniSONE  10 mg Oral Q breakfast  . sodium chloride  3 mL Intravenous Q12H   Elmarie Shiley, MD 02/12/2015, 8:41 AM

## 2015-02-12 NOTE — Progress Notes (Signed)
Daily Progress Note   Patient Name: Travis [name]       Date: 02/12/2015 DOB: 03/21/1941  Age: 74 y.o. MRN#: [id number] Attending Physician: Caren Griffins, MD Primary Care Physician: [name], MD Admit Date: 01/29/2015   Subjective: Travis [name] is by himself today. He is down and is a little upset that everyone is being so pessimistic that he is not going to do well. He tells me he is still hopeful and faithful that he will improve. He is already saying that he misses his life - he was very active and healthy and is struggling dealing with all these health issues facing him. He tells me that "I do not want to do dialysis" and I clarified that if his kidneys do not improve he would still not want dialysis? - he tells me "yes I will do it if I have too." He is very much focused on positive thinking and healing but is open to options such as dialysis if indicated.   Length of Stay: 14 days  Current Medications: Scheduled Meds:  . anticoagulant sodium citrate  5 mL Intracatheter Once  . calcium carbonate      . calcium carbonate  2 tablet Oral Q3H  . calcium gluconate IVPB  4 g Intravenous Once  . citrate dextrose      . cyanocobalamin  500 mcg Oral Daily  . diphenhydrAMINE      . docusate sodium  100 mg Oral BID  . feeding supplement (NEPRO CARB STEADY)  237 mL Oral BID BM  . ferrous sulfate  325 mg Oral BID WC  . furosemide  40 mg Intravenous BID  . insulin aspart  0-9 Units Subcutaneous TID WC  . NIFEdipine  30 mg Oral Daily  . pantoprazole  40 mg Oral Daily  . polyethylene glycol  17 g Oral Daily  . predniSONE  10 mg Oral Q breakfast  . sodium chloride  3 mL Intravenous Q12H    Continuous Infusions: . sodium chloride Stopped (02/01/15 1719)  . citrate dextrose      PRN Meds: acetaminophen **OR** acetaminophen, alum & mag hydroxide-simeth, diphenhydrAMINE, hydrALAZINE, morphine injection, ondansetron, promethazine **OR** promethazine **OR** promethazine, sodium  chloride  Palliative Performance Scale: 40%     Vital Signs: BP 156/84 mmHg  Pulse 91  Temp(Src) 98.7 F (37.1 C) (Oral)  Resp 18  Ht 5\' 10"  (1.778 m)  Wt 95.9 kg (211 lb 6.7 oz)  BMI 30.34 kg/m2  SpO2 100% SpO2: SpO2: 100 % O2 Device: O2 Device: Nasal Cannula O2 Flow Rate: O2 Flow Rate (L/min): 2 L/min  Intake/output summary:  Intake/Output Summary (Last 24 hours) at 02/12/15 1522 Last data filed at 02/12/15 1230  Gross per 24 hour  Intake    480 ml  Output    851 ml  Net   -371 ml   LBM:  5/31 Baseline Weight: Weight: 97.977 kg (216 lb) Most recent weight: Weight: 95.9 kg (211 lb 6.7 oz)  Physical Exam: Gen: NAD, lying in bed HEENT: [place]/AT, moist mucous membranes, Lt IJ CV: RRR Pulm: No labored breathing  Extremities: BLE 3+ edema Neuro: Awake, oriented x 3, pleasant  Additional Data Reviewed: Recent Labs     02/11/15  0334  02/11/15  1611  02/12/15  0500  WBC  15.5*   --   16.0*  HGB  7.3*  8.8*  7.2*  PLT  89*   --   94*  NA  135  135  137  BUN  68*  73*  64*  CREATININE  6.55*  6.30*  6.81*     Problem List:  Patient Active Problem List   Diagnosis Date Noted  . AKI (acute kidney injury)   . Palliative care encounter   . Acute on chronic renal failure 01/31/2015  . Acute DVT of right tibial vein 01/29/2015  . Acute kidney injury 01/29/2015  . DVT (deep venous thrombosis) 01/29/2015  . Acute on chronic respiratory failure with hypoxia 01/28/2015  . Physical deconditioning 01/28/2015  . Sjogren's disease 01/28/2015  . Pedal edema 01/28/2015  . Peripheral edema 01/07/2015  . HCAP (healthcare-associated pneumonia) 01/06/2015  . Steroid-induced hyperglycemia 01/06/2015  . Dyspnea   . Pulmonary fibrosis   . Knee pain   . Shoulder pain   . 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 Anasarca 12/17/2014  . Fever 12/17/2014  . Elevated blood pressure 12/17/2014  . Normocytic anemia 12/17/2014     Palliative Care Assessment  & Plan    Code Status:  Full code - did not address today  Goals of Care:  Still hopeful for improvement. Very spiritual.   3. Symptom Management:  Denies pain, nausea, constipation.  Edema/renal failure: Continue plasmapheresis and management by renal and primary.  4. Palliative Prophylaxis:  Stool Softner: yes  5. Prognosis: Unable to determine  5. Discharge Planning: Unable to say - home when stable    Thank you for allowing the Palliative Medicine Team to assist in the care of this patient.   Time In: 1340 Time Out: 1400 Total Time 34min Prolonged Time Billed  no     Greater than 50%  of this time was spent counseling and coordinating care related to the above assessment and plan.   Pershing Proud, NP  [id number], 3:22 PM  Please contact Palliative Medicine Team phone at [phone] for questions and concerns.

## 2015-02-13 DIAGNOSIS — D594 Other nonautoimmune hemolytic anemias: Secondary | ICD-10-CM

## 2015-02-13 LAB — CBC
HEMATOCRIT: 25 % — AB (ref 39.0–52.0)
Hemoglobin: 7.6 g/dL — ABNORMAL LOW (ref 13.0–17.0)
MCH: 31.1 pg (ref 26.0–34.0)
MCHC: 30.4 g/dL (ref 30.0–36.0)
MCV: 102.5 fL — ABNORMAL HIGH (ref 78.0–100.0)
PLATELETS: 128 10*3/uL — AB (ref 150–400)
RBC: 2.44 MIL/uL — ABNORMAL LOW (ref 4.22–5.81)
RDW: 22 % — ABNORMAL HIGH (ref 11.5–15.5)
WBC: 15.8 10*3/uL — AB (ref 4.0–10.5)

## 2015-02-13 LAB — BASIC METABOLIC PANEL
ANION GAP: 12 (ref 5–15)
BUN: 58 mg/dL — ABNORMAL HIGH (ref 6–20)
CO2: 32 mmol/L (ref 22–32)
Calcium: 8.4 mg/dL — ABNORMAL LOW (ref 8.9–10.3)
Chloride: 94 mmol/L — ABNORMAL LOW (ref 101–111)
Creatinine, Ser: 6.73 mg/dL — ABNORMAL HIGH (ref 0.61–1.24)
GFR calc Af Amer: 8 mL/min — ABNORMAL LOW (ref >60)
GFR calc non Af Amer: 7 mL/min — ABNORMAL LOW (ref >60)
Glucose, Bld: 102 mg/dL — ABNORMAL HIGH (ref 65–99)
Potassium: 3.9 mmol/L (ref 3.5–5.1)
Sodium: 138 mmol/L (ref 135–145)

## 2015-02-13 LAB — THERAPEUTIC PLASMA EXCHANGE (BLOOD BANK)
PLASMA VOLUME NEEDED: 3800
Plasma Exchange: 3800
UNIT DIVISION: 0
UNIT DIVISION: 0
UNIT DIVISION: 0
UNIT DIVISION: 0
UNIT DIVISION: 0
UNIT DIVISION: 0
Unit division: 0
Unit division: 0
Unit division: 0
Unit division: 0
Unit division: 0
Unit division: 0
Unit division: 0
Unit division: 0

## 2015-02-13 LAB — POCT I-STAT, CHEM 8
BUN: 64 mg/dL — AB (ref 6–20)
Calcium, Ion: 1.03 mmol/L — ABNORMAL LOW (ref 1.13–1.30)
Chloride: 91 mmol/L — ABNORMAL LOW (ref 101–111)
Creatinine, Ser: 6.5 mg/dL — ABNORMAL HIGH (ref 0.61–1.24)
Glucose, Bld: 128 mg/dL — ABNORMAL HIGH (ref 65–99)
HEMATOCRIT: 26 % — AB (ref 39.0–52.0)
HEMOGLOBIN: 8.8 g/dL — AB (ref 13.0–17.0)
Potassium: 4.2 mmol/L (ref 3.5–5.1)
SODIUM: 137 mmol/L (ref 135–145)
TCO2: 29 mmol/L (ref 0–100)

## 2015-02-13 LAB — RENAL FUNCTION PANEL
ANION GAP: 12 (ref 5–15)
Albumin: 2.9 g/dL — ABNORMAL LOW (ref 3.5–5.0)
BUN: 59 mg/dL — ABNORMAL HIGH (ref 6–20)
CO2: 33 mmol/L — AB (ref 22–32)
CREATININE: 6.86 mg/dL — AB (ref 0.61–1.24)
Calcium: 8.5 mg/dL — ABNORMAL LOW (ref 8.9–10.3)
Chloride: 93 mmol/L — ABNORMAL LOW (ref 101–111)
GFR, EST AFRICAN AMERICAN: 8 mL/min — AB (ref >60)
GFR, EST NON AFRICAN AMERICAN: 7 mL/min — AB (ref >60)
GLUCOSE: 101 mg/dL — AB (ref 65–99)
Phosphorus: 5.6 mg/dL — ABNORMAL HIGH (ref 2.5–4.6)
Potassium: 3.9 mmol/L (ref 3.5–5.1)
SODIUM: 138 mmol/L (ref 135–145)

## 2015-02-13 LAB — GLUCOSE, CAPILLARY
GLUCOSE-CAPILLARY: 111 mg/dL — AB (ref 65–99)
Glucose-Capillary: 125 mg/dL — ABNORMAL HIGH (ref 65–99)
Glucose-Capillary: 117 mg/dL — ABNORMAL HIGH (ref 65–99)

## 2015-02-13 MED ORDER — DIPHENHYDRAMINE HCL 25 MG PO CAPS: 25.0000 mg | ORAL_CAPSULE | Freq: Four times a day (QID) | ORAL | Status: DC | PRN

## 2015-02-13 MED ORDER — ACD FORMULA A 0.73-2.45-2.2 GM/100ML VI SOLN
Status: AC
  Administered 2015-02-13: 16:00:00
  Filled 2015-02-13: qty 1000

## 2015-02-13 MED ORDER — CALCIUM CARBONATE ANTACID 500 MG PO CHEW
2.0000 | CHEWABLE_TABLET | ORAL | Status: DC
  Administered 2015-02-13: 400 mg via ORAL
  Filled 2015-02-13 (×2): qty 2

## 2015-02-13 MED ORDER — ANTICOAGULANT SODIUM CITRATE 4% (200MG/5ML) IV SOLN
5.0000 mL | Freq: Once | Status: DC
  Filled 2015-02-13 (×2): qty 250

## 2015-02-13 MED ORDER — SODIUM CHLORIDE 0.9 % IV SOLN
4.0000 g | Freq: Once | INTRAVENOUS | Status: AC
  Administered 2015-02-13: 4 g via INTRAVENOUS
  Filled 2015-02-13 (×2): qty 40

## 2015-02-13 MED ORDER — ACETAMINOPHEN 325 MG PO TABS: 650.0000 mg | ORAL_TABLET | ORAL | Status: DC | PRN

## 2015-02-13 MED ORDER — CALCIUM CARBONATE ANTACID 500 MG PO CHEW
CHEWABLE_TABLET | ORAL | Status: AC
  Administered 2015-02-13: 400 mg via ORAL
  Filled 2015-02-13: qty 2

## 2015-02-13 MED ORDER — ACD FORMULA A 0.73-2.45-2.2 GM/100ML VI SOLN
500.0000 mL | Status: DC
  Administered 2015-02-13: 500 mL via INTRAVENOUS
  Filled 2015-02-13: qty 500

## 2015-02-13 NOTE — Care Management Note (Signed)
Case Management Note  Patient Details  Name: Travis [name] MRN: [id number] Date of Birth: Aug 07, 1941  Subjective/Objective:      Pt admitted with confirmed DVT              Action/Plan:  Anticoagulation planned during admit.  CM consulted for lovenox discharge planning.  CM will continue to monitor for disposition needs.   Expected Discharge Date:                  Expected Discharge Plan:  Home with [facility] In-House Referral:     Discharge planning Services  CM Consult, Medication Assistance  Post Acute Care Choice:  Resumption of Svcs/PTA Provider Choice offered to:     DME Arranged:     DME Agency:     HH Arranged:  RN, PTRN, PT resumption of services [place] Agency:  [place]  Status of Service:   In process, will continue to follow  Medicare Important Message Given:  Yes Date Medicare IM Given:  02/13/15 Medicare IM give by:  Elenor Quinones Date Additional Medicare IM Given:  02/03/15 Additional Medicare Important Message give by:  Marvetta Gibbons   If discussed at [employer] of Stay Meetings, dates discussed:  02/13/15  Additional Comments: 01/31/15 Elenor Quinones, RN, BSN [phone]  01/31/15 Elenor Quinones, RN, BSN  [phone].  Wife successfully picked up prescription from pharmacy, instructed by CM to hold use of medication until post discharge/discharge instructions.  Wife stated she understood and medication would be taken home.  No other CM needs at this time.  01/31/15 Elenor Quinones, RN, BSN [phone].  Patient does not have prescription medication coverage, amount due for pt would have caused hardship.  MD requested to write prescription to be faxed to [facility] (cost of $30.62).  Prescription faxed, Gerald Stabs at pharmacy received prescription, currently working on filling.  Wife is on way to pick up prescription for pt and will keep pending discharge/discharge instructions.  MD instructed nurse to  order resumption of [facility] services with [facility] (RN,PT).  Pt has home 02, states he has transport tank in room for discharge.  No additional CM needs at this time.  01/30/15  Elenor Quinones, RN, BSN [phone].  CM submitted benefits check for Lovenox.  Pt states he already has walker and toilet chair and is currently active with [facility].  CM contacted [facility] to make aware of pt admission; pt receives home O2, RN and PT from [facility].  CM will request resumption of orders from MD.  CM was informed by pt that he has VA benefits.  CM contacted [facility] and spoke with Mickel Baas; pt is active for 40% coverage as it relates to pts prior approved condition with his knee only.   Pt states he lost his VA card.  Pt states he has a PCP at the [facility] clinic; Dr. Dell Ponto phone number [phone], last visited 5/12, this information was confirmed.  CM will continue to monitor for disposition needs.  Maryclare Labrador, RN 02/13/2015, 3:49 PM

## 2015-02-13 NOTE — Progress Notes (Signed)
Pt completed TPE with no complications. Alert, vss, no c/o. Report called and patient returned safely to room.

## 2015-02-13 NOTE — Progress Notes (Signed)
PT Cancellation Note  Patient Details Name: Travis [name] MRN: [id number] DOB: 02/28/1941   Cancelled Treatment:    Reason Eval/Treat Not Completed: Patient at procedure or test/unavailable. Pt off unit for plasmapheresis.   [name] 02/13/2015, 3:45 PM  Pager [phone]

## 2015-02-13 NOTE — Progress Notes (Signed)
[name] Interim History: 74 y.o. year-old with hx of DJD and GERD, otherwise healthy until April 2016 in hospital twice with pulm infiltrates (pna vs fibrosis), cough, SOB, LE edema. Work up was ANA negative but was + for RF, antiRo and antiLa and for ANCA. He has been treated with some oral prednisone. He was ordered for home O2 by his PCP the week prior to admission. On 5/18 presented to ED with severe fatigue, LE edema. No SOB or CP. Was seen in pulm clinic and dopplers were + for DVT R leg below the knee. Admitted and started on IV heparin. Creat was up, new since last hosp stay. Patient received 3 days of high dose solumedrol and now on 60. Renal on board. Renal biopsy shows thrombotic microangiopathy that at this time appears that it might be related to hypertensive emergency versus atypical HUS. Was found to be in flank pain Hbg drop so heparin stopped, IV filter placed. CT abd pelvis showed large perinephric hemorrhage. Currently undergoing plasmapheresis and prednisone.  ` HPI/Subjective: - in much better spirits today, smiling - he is ready to "fight" - no chest pain, shortness of breath, no abdominal pain, nausea or vomiting.   Assessment/Plan:  Acute on chronic kidney disease stage III: - Renal consulted and patient underwent a renal biopsy on 5.25.2016 by IR. 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 He has empirically been started on plasmapheresis, and tapering down steroids, to get 7 treatments total - Pending ANA. Centromere negative. Anti-scleroderma 70 antibodies negative. - Cont IV lasix per nephrology - may need HD, for now renal function seems to have stabilized  Renal hematoma: - CT scan of the abdomen and pelvis showed evidence of moderate-sized perinephric hemorrhage post percutaneous biopsy. Anticoagulation stopped and IVC filter was placed by IR. - Hemoglobin has remained stable,  will transfuse if less than 7, continue to monitor.  Acute DVT of right tibial vein with intermediate probability VQ scan: - CT scan of the abdomen and pelvis was done that showed evidence of moderate left perinephric hemorrhage post percutaneous Biopsy.  - dc'd coumadin and heparin, IR was consulted for an IVC filter placement.   Elevated blood pressure without a diagnosis of hypertension: - Continue IV hydralazine  Steroid-induced hyperglycemia: - Now tapering down steroids. - A1c was 4.9 and continue sliding scale insulin.  Acute on chronic anemia/acute blood loss anemia: - Continue Protonix.  Physical deconditioning: - HHPT  Sjogren's disease - Follow with rheumatology as an outpatient.   Consultants:  Renal  IR  Procedures:  Renal biopsy on 5.26.2016  Antibiotics:  None  Objective: Filed Vitals:   02/12/15 1856 02/12/15 1904 02/12/15 2152 02/13/15 0606  BP: 140/78 137/70 145/75 145/73  Pulse: 98 97 100 96  Temp: 98.4 F (36.9 C) 99.2 F (37.3 C) 98.3 F (36.8 C) 98.3 F (36.8 C)  TempSrc: Oral Oral Oral Oral  Resp: 16 17 18 18   Height:      Weight:    96.3 kg (212 lb 4.9 oz)  SpO2:  100% 96% 100%    Intake/Output Summary (Last 24 hours) at 02/13/15 1251 Last data filed at 02/13/15 1127  Gross per 24 hour  Intake    250 ml  Output   1425 ml  Net  -1175 ml   Filed Weights   02/12/15 0552 02/12/15 1715 02/13/15 0606  Weight: 95.9 kg (211 lb 6.7 oz) 96.2 kg (212 lb 1.3 oz) 96.3  kg (212 lb 4.9 oz)    Exam: General: Alert, awake, oriented x3, in no acute distress.  HEENT: No bruits, no goiter.  Heart: Regular rate and rhythm, no MRG, no JVD, 1-2 + edema Lungs: Good air movement, bibasilar crackles Abdomen: Soft, nontender, nondistended, positive bowel sounds.  Neuro: Grossly intact, nonfocal. Skin: no rashes Psych: normal mood  Data Reviewed: Basic Metabolic Panel:  Recent Labs Lab 02/09/15 0331 02/10/15 0414  02/11/15 0334  02/11/15 1611 02/12/15 0500 02/13/15 0452 02/13/15 0457  NA 138 137  < > 135 135 137 138 138  K 3.6 3.5  < > 3.3* 4.0 3.7 3.9 3.9  CL 102 101  < > 96* 92* 93* 93* 94*  CO2 25 25  --  29  --  32 33* 32  GLUCOSE 118* 109*  < > 101* 152* 102* 101* 102*  BUN 78* 72*  < > 68* 73* 64* 59* 58*  CREATININE 6.43* 6.38*  < > 6.55* 6.30* 6.81* 6.86* 6.73*  CALCIUM 8.1* 8.1*  --  8.0*  --  8.3* 8.5* 8.4*  PHOS 6.4* 5.9*  --  5.6*  --  5.7* 5.6*  --   < > = values in this interval not displayed.  Liver Function Tests:  Recent Labs Lab 02/09/15 0331 02/10/15 0414 02/11/15 0334 02/12/15 0500 02/13/15 0452  ALBUMIN 2.5* 2.5* 2.7* 2.7* 2.9*   CBC:  Recent Labs Lab 02/09/15 1600 02/10/15 0414 02/10/15 1337 02/11/15 0334 02/11/15 1611 02/12/15 0500 02/13/15 0457  WBC 17.5* 16.8*  --  15.5*  --  16.0* 15.8*  HGB 8.2* 7.3* 9.2* 7.3* 8.8* 7.2* 7.6*  HCT 25.4* 23.4* 27.0* 23.2* 26.0* 23.2* 25.0*  MCV 96.9 99.2  --  99.1  --  100.9* 102.5*  PLT 67* 81*  --  89*  --  94* 128*   BNP (last 3 results)  Recent Labs  12/17/14 1934 01/02/15 1241 01/06/15 1758  BNP 38.6 66.6 83.3   CBG:  Recent Labs Lab 02/12/15 1158 02/12/15 1159 02/12/15 1611 02/12/15 2151 02/13/15 1119  GLUCAP 331* 113* 114* 122* 125*   Studies: No results found.  Scheduled Meds: . anticoagulant sodium citrate  5 mL Intracatheter Once  . calcium carbonate  2 tablet Oral Q3H  . calcium gluconate IVPB  4 g Intravenous Once  . cyanocobalamin  500 mcg Oral Daily  . docusate sodium  100 mg Oral BID  . feeding supplement (NEPRO CARB STEADY)  237 mL Oral BID BM  . ferrous sulfate  325 mg Oral BID WC  . furosemide  40 mg Intravenous BID  . insulin aspart  0-9 Units Subcutaneous TID WC  . NIFEdipine  30 mg Oral Daily  . pantoprazole  40 mg Oral Daily  . polyethylene glycol  17 g Oral Daily  . sodium chloride  3 mL Intravenous Q12H   Continuous Infusions: . sodium chloride Stopped (02/01/15 1719)  .  citrate dextrose      Marzetta Board  [facility] Pager [phone]. If 7PM-7AM, please contact night-coverage at [email or web address], password [facility] 02/13/2015, 12:51 PM  LOS: 15 days

## 2015-02-13 NOTE — Progress Notes (Signed)
Patient ID: Travis [name], male   DOB: [birth year], 74 y.o.   MRN: [id number]  [facility] Progress Note    Assessment/ Plan:   1. 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 Plans in place for continued plasmapheresis for the next 2 days (a total of 7 days). Continue current doses of furosemide (40 mg IV twice a day) to help manage volume excess. 2. 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 Hypertension: Blood pressures acceptable on hydralazine, nifedipine XL and furosemide-patient not too enthusiastic about low sodium diet 4. Right lower extremity DVT: Anticoagulation discontinued due to perinephric hemorrhage status post kidney biopsy. Status post IVC filter  Subjective:   Reports to be feeling fair-reports poor appetite due to limited dietary choices (denies dysgeusia, nausea, vomiting)    Objective:   BP 145/73 mmHg  Pulse 96  Temp(Src) 98.3 F (36.8 C) (Oral)  Resp 18  Ht 5\' 10"  (1.778 m)  Wt 96.3 kg (212 lb 4.9 oz)  BMI 30.46 kg/m2  SpO2 100%  Intake/Output Summary (Last 24 hours) at 02/13/15 0849 Last data filed at 02/13/15 0804  Gross per 24 hour  Intake    360 ml  Output   1826 ml  Net  -1466 ml   Weight change: 0 kg (0 lb)  Physical Exam: Gen: Comfortably resting in bed, wife at bedside CVS: Pulse regular in rate and rhythm, S1 and S2 normal Resp: Fine rales over bases otherwise clear Abd: Soft, obese, nontender Ext: 2-3+ edema  Imaging: No results found.  Labs: BMET  Recent Labs Lab 02/07/15 0429 02/08/15 0247  02/09/15 0331 02/10/15 0414 02/10/15 1337 02/11/15 0334 02/11/15 1611 02/12/15 0500 02/13/15 0452 02/13/15 0457  NA 134* 134*  < > 138 137 138 135 135 137 138 138  K 4.0 4.1  < > 3.6 3.5 3.8 3.3* 4.0 3.7 3.9 3.9  CL 105 103  < > 102 101 96* 96* 92* 93*  93* 94*  CO2 20* 22  --  25 25  --  29  --  32 33* 32  GLUCOSE 137* 118*  < > 118* 109* 166* 101* 152* 102* 101* 102*  BUN 77* 80*  < > 78* 72* 79* 68* 73* 64* 59* 58*  CREATININE 5.82* 6.18*  < > 6.43* 6.38* 6.10* 6.55* 6.30* 6.81* 6.86* 6.73*  CALCIUM 7.9* 8.0*  --  8.1* 8.1*  --  8.0*  --  8.3* 8.5* 8.4*  PHOS 6.5* 6.7*  --  6.4* 5.9*  --  5.6*  --  5.7* 5.6*  --   < > = values in this interval not displayed. CBC  Recent Labs Lab 02/10/15 0414  02/11/15 0334 02/11/15 1611 02/12/15 0500 02/13/15 0457  WBC 16.8*  --  15.5*  --  16.0* 15.8*  HGB 7.3*  < > 7.3* 8.8* 7.2* 7.6*  HCT 23.4*  < > 23.2* 26.0* 23.2* 25.0*  MCV 99.2  --  99.1  --  100.9* 102.5*  PLT 81*  --  89*  --  94* 128*  < > = values in this interval not displayed.  Medications:    . calcium gluconate IVPB  4 g Intravenous Once  . cyanocobalamin  500 mcg Oral Daily  . docusate sodium  100 mg Oral BID  . feeding supplement (NEPRO CARB STEADY)  237 mL Oral BID BM  .  ferrous sulfate  325 mg Oral BID WC  . furosemide  40 mg Intravenous BID  . insulin aspart  0-9 Units Subcutaneous TID WC  . NIFEdipine  30 mg Oral Daily  . pantoprazole  40 mg Oral Daily  . polyethylene glycol  17 g Oral Daily  . sodium chloride  3 mL Intravenous Q12H    Elmarie Shiley, MD 02/13/2015, 8:49 AM

## 2015-02-14 DIAGNOSIS — N185 Chronic kidney disease, stage 5: Secondary | ICD-10-CM

## 2015-02-14 LAB — THERAPEUTIC PLASMA EXCHANGE (BLOOD BANK)
PLASMA VOLUME NEEDED: 3800
UNIT DIVISION: 0
UNIT DIVISION: 0
UNIT DIVISION: 0
UNIT DIVISION: 0
UNIT DIVISION: 0
Unit division: 0
Unit division: 0
Unit division: 0
Unit division: 0
Unit division: 0
Unit division: 0
Unit division: 0
Unit division: 0
Unit division: 0

## 2015-02-14 LAB — GLUCOSE, CAPILLARY
GLUCOSE-CAPILLARY: 110 mg/dL — AB (ref 65–99)
Glucose-Capillary: 109 mg/dL — ABNORMAL HIGH (ref 65–99)
Glucose-Capillary: 96 mg/dL (ref 65–99)
Glucose-Capillary: 120 mg/dL — ABNORMAL HIGH (ref 65–99)

## 2015-02-14 LAB — RENAL FUNCTION PANEL
ALBUMIN: 2.7 g/dL — AB (ref 3.5–5.0)
ANION GAP: 14 (ref 5–15)
BUN: 55 mg/dL — ABNORMAL HIGH (ref 6–20)
CALCIUM: 8.2 mg/dL — AB (ref 8.9–10.3)
CO2: 32 mmol/L (ref 22–32)
CREATININE: 6.93 mg/dL — AB (ref 0.61–1.24)
Chloride: 94 mmol/L — ABNORMAL LOW (ref 101–111)
GFR calc non Af Amer: 7 mL/min — ABNORMAL LOW (ref >60)
GFR, EST AFRICAN AMERICAN: 8 mL/min — AB (ref >60)
Glucose, Bld: 93 mg/dL (ref 65–99)
POTASSIUM: 4 mmol/L (ref 3.5–5.1)
Phosphorus: 5.9 mg/dL — ABNORMAL HIGH (ref 2.5–4.6)
SODIUM: 140 mmol/L (ref 135–145)

## 2015-02-14 LAB — CBC
HEMATOCRIT: 23.5 % — AB (ref 39.0–52.0)
HEMOGLOBIN: 7.3 g/dL — AB (ref 13.0–17.0)
MCH: 32 pg (ref 26.0–34.0)
MCHC: 31.1 g/dL (ref 30.0–36.0)
MCV: 103.1 fL — ABNORMAL HIGH (ref 78.0–100.0)
PLATELETS: 143 10*3/uL — AB (ref 150–400)
RBC: 2.28 MIL/uL — ABNORMAL LOW (ref 4.22–5.81)
RDW: 20.6 % — ABNORMAL HIGH (ref 11.5–15.5)
WBC: 15.1 10*3/uL — ABNORMAL HIGH (ref 4.0–10.5)

## 2015-02-14 MED ORDER — ACETAMINOPHEN 325 MG PO TABS: 650.0000 mg | ORAL_TABLET | ORAL | Status: DC | PRN

## 2015-02-14 MED ORDER — ACD FORMULA A 0.73-2.45-2.2 GM/100ML VI SOLN
Status: AC
  Administered 2015-02-14: 11:00:00
  Filled 2015-02-14: qty 1000

## 2015-02-14 MED ORDER — CALCIUM GLUCONATE 10 % IV SOLN
4.0000 g | Freq: Once | INTRAVENOUS | Status: AC
  Administered 2015-02-14: 4 g via INTRAVENOUS
  Filled 2015-02-14 (×2): qty 40

## 2015-02-14 MED ORDER — DIPHENHYDRAMINE HCL 25 MG PO CAPS: 25.0000 mg | ORAL_CAPSULE | Freq: Four times a day (QID) | ORAL | Status: DC | PRN

## 2015-02-14 MED ORDER — ANTICOAGULANT SODIUM CITRATE 4% (200MG/5ML) IV SOLN
5.0000 mL | Freq: Once | Status: DC
  Filled 2015-02-14 (×2): qty 250

## 2015-02-14 MED ORDER — ACD FORMULA A 0.73-2.45-2.2 GM/100ML VI SOLN
500.0000 mL | Status: DC
  Administered 2015-02-14: 500 mL via INTRAVENOUS
  Filled 2015-02-14: qty 500

## 2015-02-14 MED ORDER — CALCIUM CARBONATE ANTACID 500 MG PO CHEW
2.0000 | CHEWABLE_TABLET | ORAL | Status: DC
  Administered 2015-02-14: 400 mg via ORAL
  Filled 2015-02-14 (×2): qty 2

## 2015-02-14 MED ORDER — CALCIUM CARBONATE ANTACID 500 MG PO CHEW
CHEWABLE_TABLET | ORAL | Status: AC
  Administered 2015-02-14: 400 mg via ORAL
  Filled 2015-02-14: qty 2

## 2015-02-14 NOTE — Progress Notes (Signed)
[name] Interim History: 74 y.o. year-old with hx of DJD and GERD, otherwise healthy until April 2016 in hospital twice with pulm infiltrates (pna vs fibrosis), cough, SOB, LE edema. Work up was ANA negative but was + for RF, antiRo and antiLa and for ANCA. He has been treated with some oral prednisone. He was ordered for home O2 by his PCP the week prior to admission. On 5/18 presented to ED with severe fatigue, LE edema. No SOB or CP. Was seen in pulm clinic and dopplers were + for DVT R leg below the knee. Admitted and started on IV heparin. Creat was up, new since last hosp stay. Patient received 3 days of high dose solumedrol and now on 60. Renal on board. Renal biopsy shows thrombotic microangiopathy that at this time appears that it might be related to hypertensive emergency versus atypical HUS. Was found to be in flank pain Hbg drop so heparin stopped, IV filter placed. CT abd pelvis showed large perinephric hemorrhage. Currently undergoing plasmapheresis and prednisone.  ` HPI/Subjective: - seen during plasmapheresis  Assessment/Plan:  Acute on chronic kidney disease stage III: - Renal consulted and patient underwent a renal biopsy on 5.25.2016 by IR. 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 He has empirically been started on plasmapheresis, and tapering down steroids, to get 7 treatments total, today is the last  - ANA negative, Centromere negative. Anti-scleroderma 70 antibodies negative. - Cont IV lasix per nephrology - will likely need HD, nephrology will consult vascular to plan access - will monitor over weekend and d/c Monday or Tuesday if stable  Renal hematoma: - CT scan of the abdomen and pelvis showed evidence of moderate-sized perinephric hemorrhage post percutaneous biopsy. Anticoagulation stopped and IVC filter was placed by IR. - Hemoglobin has remained stable, will transfuse if  less than 7, continue to monitor.  Acute DVT of right tibial vein with intermediate probability VQ scan: - CT scan of the abdomen and pelvis was done that showed evidence of moderate left perinephric hemorrhage post percutaneous Biopsy.  - dc'd coumadin and heparin, IR was consulted for an IVC filter placement.   Elevated blood pressure without a diagnosis of hypertension: - Continue IV hydralazine  Steroid-induced hyperglycemia: - Now tapering down steroids. - A1c was 4.9 and continue sliding scale insulin.  Acute on chronic anemia/acute blood loss anemia: - Continue Protonix.  Physical deconditioning: - HHPT  Sjogren's disease - Follow with rheumatology as an outpatient.   Consultants:  Renal  IR  Procedures:  Renal biopsy on 5.26.2016  Antibiotics:  None  Objective: Filed Vitals:   02/14/15 1219 02/14/15 1224 02/14/15 1233 02/14/15 1239  BP: 132/72 132/72 132/73 139/72  Pulse: 93 92 94 95  Temp: 98.7 F (37.1 C) 98.7 F (37.1 C) 98.8 F (37.1 C) 98.5 F (36.9 C)  TempSrc: Oral Oral Oral Oral  Resp: 18 16 18 20   Height:      Weight:      SpO2: 99% 99% 99% 99%    Intake/Output Summary (Last 24 hours) at 02/14/15 1247 Last data filed at 02/14/15 0745  Gross per 24 hour  Intake    493 ml  Output   1150 ml  Net   -657 ml   Filed Weights   02/12/15 1715 02/13/15 0606 02/14/15 [id number]  Weight: 96.2 kg (212 lb 1.3 oz) 96.3 kg (212 lb 4.9 oz) 96.1 kg (211 lb 13.8 oz)    Exam: General:  Alert, awake, oriented x3, in no acute distress, in HD Heart: Regular rate and rhythm, no MRG, no JVD, 1-2 + edema Lungs: Good air movement, bibasilar crackles Abdomen: Soft, nontender, nondistended, positive bowel sounds.  Neuro: Grossly intact, nonfocal. Psych: normal mood  Data Reviewed: Basic Metabolic Panel:  Recent Labs Lab 02/10/15 0414  02/11/15 0334  02/12/15 0500 02/13/15 0452 02/13/15 0457 02/13/15 1532 02/14/15 0604  NA 137  < > 135  < > 137 138  138 137 140  K 3.5  < > 3.3*  < > 3.7 3.9 3.9 4.2 4.0  CL 101  < > 96*  < > 93* 93* 94* 91* 94*  CO2 25  --  29  --  32 33* 32  --  32  GLUCOSE 109*  < > 101*  < > 102* 101* 102* 128* 93  BUN 72*  < > 68*  < > 64* 59* 58* 64* 55*  CREATININE 6.38*  < > 6.55*  < > 6.81* 6.86* 6.73* 6.50* 6.93*  CALCIUM 8.1*  --  8.0*  --  8.3* 8.5* 8.4*  --  8.2*  PHOS 5.9*  --  5.6*  --  5.7* 5.6*  --   --  5.9*  < > = values in this interval not displayed.  Liver Function Tests:  Recent Labs Lab 02/10/15 0414 02/11/15 0334 02/12/15 0500 02/13/15 0452 02/14/15 0604  ALBUMIN 2.5* 2.7* 2.7* 2.9* 2.7*   CBC:  Recent Labs Lab 02/10/15 0414  02/11/15 0334 02/11/15 1611 02/12/15 0500 02/13/15 0457 02/13/15 1532 02/14/15 0604  WBC 16.8*  --  15.5*  --  16.0* 15.8*  --  15.1*  HGB 7.3*  < > 7.3* 8.8* 7.2* 7.6* 8.8* 7.3*  HCT 23.4*  < > 23.2* 26.0* 23.2* 25.0* 26.0* 23.5*  MCV 99.2  --  99.1  --  100.9* 102.5*  --  103.1*  PLT 81*  --  89*  --  94* 128*  --  143*  < > = values in this interval not displayed. BNP (last 3 results)  Recent Labs  12/17/14 1934 01/02/15 1241 01/06/15 1758  BNP 38.6 66.6 83.3   CBG:  Recent Labs Lab 02/12/15 2151 02/13/15 1119 02/13/15 1802 02/13/15 2133 02/14/15 0549  GLUCAP 122* 125* 111* 117* 110*   Studies: No results found.  Scheduled Meds: . anticoagulant sodium citrate  5 mL Intracatheter Once  . calcium carbonate  2 tablet Oral Q3H  . calcium gluconate IVPB  4 g Intravenous Once  . cyanocobalamin  500 mcg Oral Daily  . docusate sodium  100 mg Oral BID  . feeding supplement (NEPRO CARB STEADY)  237 mL Oral BID BM  . ferrous sulfate  325 mg Oral BID WC  . insulin aspart  0-9 Units Subcutaneous TID WC  . NIFEdipine  30 mg Oral Daily  . pantoprazole  40 mg Oral Daily  . polyethylene glycol  17 g Oral Daily  . sodium chloride  3 mL Intravenous Q12H   Continuous Infusions: . sodium chloride Stopped (02/01/15 1719)  . citrate dextrose       Marzetta Board  [facility] Pager [phone]. If 7PM-7AM, please contact night-coverage at [email or web address], password [facility] 02/14/2015, 12:47 PM  LOS: 16 days

## 2015-02-14 NOTE — Progress Notes (Signed)
Physical Therapy Treatment Patient Details Name: Travis [name] MRN: [id number] DOB: [birth year] Today's Date: 02/14/2015    History of Present Illness 74 y.o. male with PMHx of Arthritis, GERD, CKD, Sjogrens who was admitted 5/18 with complaint of fatigue and lower extremity edema. 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 His current hospital stay is most notable for AKI. He was also found to have DVT for which he was anticoagulated. His renal function has worsened here which lead to renal bx revealing thrombotic microangiopathy. After renal bx, he had bleeding (perinephretic hematoma) which forced discontinuation of anticoagulants and IVC filter placement. He has been treated with high dose steroids, and given biopsy results recently started on trial of plasmapheresis. 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     PT Comments    Pt progressing with balance and activity tolerance (although HR max 130 with SaO2 decr to 88% on 2L with walking). Continues to require cues to self-regulate when to take rest breaks, to decr velocity, and for pursed lip breathing during recovery. Pt eager to go home and initiated stair training exercises.   Follow Up Recommendations  Supervision - Intermittent;Home health PT     Equipment Recommendations  None recommended by PT    Recommendations for Other Services       Precautions / Restrictions Precautions Precautions: Fall Precaution Comments: monitor O2 sats and HR    Mobility  Bed Mobility Overal bed mobility: Needs Assistance Bed Mobility: Supine to Sit     Supine to sit: Min assist     General bed mobility comments: wife provided min assist to bring pt's torso up; good body mechanics  Transfers Overall transfer level: Modified  independent Equipment used: Rolling walker (2 wheeled) Transfers: Sit to/from Stand Sit to Stand: Supervision         General transfer comment: vc for proper use of RW  Ambulation/Gait Ambulation/Gait assistance: Supervision Ambulation Distance (Feet): 300 Feet Assistive device: Rolling walker (2 wheeled) Gait Pattern/deviations: Step-through pattern;Decreased stride length Gait velocity: too fast for his cardiopulmonary status   General Gait Details: Good use of RW to decr the work of walking; cues to self-monitor for activity tolerance--pt better re: self-regulation and required cues for decr velocity    Stairs Stairs:  (began "step taps" on 6" trashcan in room)          Wheelchair Mobility    Modified Rankin (Stroke Patients Only)       Balance             Standing balance-Leahy Scale: Fair                      Cognition Arousal/Alertness: Awake/alert Behavior During Therapy: Flat affect Overall Cognitive Status: Within Functional Limits for tasks assessed                      Exercises Other Exercises Other Exercises: performed alternating step "taps" onto 6" trash can (lying on it's side). Repeated 10 reps each leg (20 total) x 3 with standing rest breaks    General Comments        Pertinent Vitals/Pain Pain Assessment: No/denies pain    Home Living  Prior Function            PT Goals (current goals can now be found in the care plan section) Acute Rehab PT Goals Patient Stated Goal: to go home PT Goal Formulation: With patient Time For Goal Achievement: 02/18/15 Potential to Achieve Goals: Good Progress towards PT goals: Progressing toward goals    Frequency  Min 3X/week    PT Plan Current plan remains appropriate    Co-evaluation             End of Session Equipment Utilized During Treatment: Oxygen Activity Tolerance: Patient tolerated treatment well Patient left: with call  bell/phone within reach;in bed     Time: [id number] PT Time Calculation (min) (ACUTE ONLY): 29 min  Charges:  $Gait Training: 8-22 mins $Therapeutic Exercise: 8-22 mins                    G Codes:      Travis [name] 2015/03/14, 5:21 PM Pager [phone]

## 2015-02-14 NOTE — Consult Note (Signed)
[facility] NOTE   MRN : [id number]  Reason for Consult: ESRD Referring Physician: Dr. Posey Pronto  History of Present Illness: 74 y/o male with acute renal failure.  IR placed left-sided dual-lumen catheter on 02/07/2015. CT scan of the abdomen and pelvis showed evidence of moderate-sized perinephric hemorrhage post percutaneous biopsy. Anticoagulation stopped and IVC filter was placed by IR.   He is without renal recovery with HD and we have been asked to access him for permanent dialysis access fistula verses graft.  Vein mapping has been ordered.  Past medical history includes hypertension managed with IV hydralazine currently and Sjogren's.  He is also being treated acutely for hyperglycemia steroid induces with insulin while in the hospital.       Current Facility-Administered Medications  Medication Dose Route Frequency Provider Last Rate Last Dose  . 0.9 %  sodium chloride infusion   Intravenous Continuous Barton Dubois, MD   Stopped at 02/01/15 1719  . acetaminophen (TYLENOL) tablet 650 mg  650 mg Oral Q6H PRN Lily Kocher, MD   650 mg at 02/09/15 2124   Or  . acetaminophen (TYLENOL) suppository 650 mg  650 mg Rectal Q6H PRN Lily Kocher, MD      . acetaminophen (TYLENOL) tablet 650 mg  650 mg Oral Q4H PRN Edrick Oh, MD      . alum & mag hydroxide-simeth (MAALOX/MYLANTA) 200-200-20 MG/5ML suspension 30 mL  30 mL Oral Q6H PRN Lily Kocher, MD      . anticoagulant sodium citrate solution 5 mL  5 mL Intracatheter Once Edrick Oh, MD      . calcium carbonate (TUMS - dosed in mg elemental calcium) chewable tablet 400 mg of elemental calcium  2 tablet Oral Q3H Edrick Oh, MD   400 mg of elemental calcium at 02/14/15 1043  . calcium gluconate 4 g in sodium chloride 0.9 % 250 mL IVPB  4 g Intravenous Once Edrick Oh, MD   4 g at 02/14/15 1100  . citrate dextrose (ACD-A anticoagulant) solution 500 mL  500 mL Intravenous Continuous Edrick Oh, MD   500 mL at  02/14/15 1216  . cyanocobalamin tablet 500 mcg  500 mcg Oral Daily Lily Kocher, MD   500 mcg at 02/13/15 1007  . diphenhydrAMINE (BENADRYL) capsule 25 mg  25 mg Oral Q6H PRN Edrick Oh, MD      . docusate sodium (COLACE) capsule 100 mg  100 mg Oral BID Barton Dubois, MD   100 mg at 02/13/15 2205  . feeding supplement (NEPRO CARB STEADY) liquid 237 mL  237 mL Oral BID BM Edrick Oh, MD   237 mL at 02/13/15 1000  . ferrous sulfate tablet 325 mg  325 mg Oral BID WC Lily Kocher, MD   325 mg at 02/13/15 1828  . hydrALAZINE (APRESOLINE) injection 25 mg  25 mg Intravenous Q8H PRN Elmarie Shiley, MD      . insulin aspart (novoLOG) injection 0-9 Units  0-9 Units Subcutaneous TID WC Lily Kocher, MD   1 Units at 02/11/15 1848  . morphine 2 MG/ML injection 2 mg  2 mg Intravenous Q4H PRN Charlynne Cousins, MD   2 mg at 02/07/15 1615  . NIFEdipine (PROCARDIA-XL/ADALAT CC) 24 hr tablet 30 mg  30 mg Oral Daily Elmarie Shiley, MD   30 mg at 02/13/15 1007  . ondansetron (ZOFRAN) injection 4 mg  4 mg Intravenous Q6H PRN Charlynne Cousins, MD   4 mg at 02/13/15 2349  .  pantoprazole (PROTONIX) EC tablet 40 mg  40 mg Oral Daily Lily Kocher, MD   40 mg at 02/13/15 1000  . polyethylene glycol (MIRALAX / GLYCOLAX) packet 17 g  17 g Oral Daily Barton Dubois, MD   17 g at 02/13/15 1007  . promethazine (PHENERGAN) tablet 12.5 mg  12.5 mg Oral Q6H PRN Charlynne Cousins, MD       Or  . promethazine (PHENERGAN) injection 12.5 mg  12.5 mg Intravenous Q6H PRN Charlynne Cousins, MD       Or  . promethazine (PHENERGAN) suppository 12.5 mg  12.5 mg Rectal Q6H PRN Charlynne Cousins, MD      . sodium chloride 0.9 % injection 10-40 mL  10-40 mL Intracatheter PRN Charlynne Cousins, MD   10 mL at 02/14/15 0816  . sodium chloride 0.9 % injection 3 mL  3 mL Intravenous Q12H Lily Kocher, MD   3 mL at 02/13/15 2207    Pt meds include: Statin :No Betablocker: No ASA: No Other anticoagulants/antiplatelets:   Past Medical  History  Diagnosis Date  . Medical history non-contributory   . Arthritis   . Pneumonia   . GERD (gastroesophageal reflux disease)   . Sjogren's disease 01/28/2015  . Pulmonary fibrosis     Past Surgical History  Procedure Laterality Date  . Hemorroidectomy  1999  . Surgical procedure to remove a mole as a child Right Eye area    At around 4 years old    Social History History  Substance Use Topics  . Smoking status: Never Smoker   . Smokeless tobacco: Never Used  . Alcohol Use: No    Family History Family History  Problem Relation Age of Onset  . Hypertension Mother     No Known Allergies   REVIEW OF SYSTEMS  General: [ ]  Weight loss, [ ]  Fever, [ ]  chills Neurologic: [ ]  Dizziness, [ ]  Blackouts, [ ]  Seizure [ ]  Stroke, [ ]  "Mini stroke", [ ]  Slurred speech, [ ]  Temporary blindness; [ ]  weakness in arms or legs, [ ]  Hoarseness [ ]  Dysphagia Cardiac: [ ]  Chest pain/pressure, [ ]  Shortness of breath at rest [ ]  Shortness of breath with exertion, [ ]  Atrial fibrillation or irregular heartbeat  Vascular: [ ]  Pain in legs with walking, [ ]  Pain in legs at rest, [ ]  Pain in legs at night,  [ ]  Non-healing ulcer, [ ]  Blood clot in vein/DVT,   Pulmonary: [ ]  Home oxygen, [ ]  Productive cough, [ ]  Coughing up blood, [ ]  Asthma,  [ ]  Wheezing [ ]  COPD Musculoskeletal:  [ ]  Arthritis, [ ]  Low back pain, [ ]  Joint pain Hematologic: [ ]  Easy Bruising, [ ]  Anemia; [ ]  Hepatitis Gastrointestinal: [ ]  Blood in stool, [ ]  Gastroesophageal Reflux/heartburn, Urinary: [ ]  chronic Kidney disease, [ ]  on HD - [ ]  MWF or [ ]  TTHS, [ ]  Burning with urination, [ ]  Difficulty urinating Skin: [ ]  Rashes, [ ]  Wounds Psychological: [ ]  Anxiety, [ ]  Depression  Physical Examination Filed Vitals:   02/14/15 1214 02/14/15 1219 02/14/15 1224 02/14/15 1233  BP: 129/70 132/72 132/72 132/73  Pulse: 96 93 92 94  Temp: 98.7 F (37.1 C) 98.7 F (37.1 C) 98.7 F (37.1 C) 98.8 F (37.1 C)   TempSrc: Oral Oral Oral Oral  Resp: 17 18 16 18   Height:      Weight:      SpO2: 97% 99% 99% 99%  Body mass index is 30.4 kg/(m^2).  General:  WDWN in NAD HENT: WNL Eyes: Pupils equal Pulmonary: normal non-labored breathing , without Rales, rhonchi,  wheezing Cardiac: RRR, without  Murmurs, rubs or gallops; No carotid bruits Abdomen: soft, NT, no masses Skin: no rashes, ulcers noted;  no Gangrene , no cellulitis; no open wounds;   Vascular Exam/Pulses:Palpable radial pulses bilaterally   Musculoskeletal: no muscle wasting or atrophy;   Neurologic: A&O X 3; Appropriate Affect ;  SENSATION: normal; MOTOR FUNCTION: 5/5 Symmetric Speech is fluent/normal   Significant Diagnostic Studies: CBC Lab Results  Component Value Date   WBC 15.1* 02/14/2015   HGB 7.3* 02/14/2015   HCT 23.5* 02/14/2015   MCV 103.1* 02/14/2015   PLT 143* 02/14/2015    BMET    Component Value Date/Time   NA 140 02/14/2015 0604   K 4.0 02/14/2015 0604   CL 94* 02/14/2015 0604   CO2 32 02/14/2015 0604   GLUCOSE 93 02/14/2015 0604   BUN 55* 02/14/2015 0604   CREATININE 6.93* 02/14/2015 0604   CALCIUM 8.2* 02/14/2015 0604   GFRNONAA 7* 02/14/2015 0604   GFRAA 8* 02/14/2015 0604   Estimated Creatinine Clearance: 11 mL/min (by C-G formula based on Cr of 6.93).  COAG Lab Results  Component Value Date   INR 1.23 02/11/2015   INR 1.26 02/10/2015   INR 1.44 02/09/2015     Non-Invasive Vascular Imaging: pending vein mapping  ASSESSMENT/PLAN:  Acute on chronic renal failure He is very frustrated and is not sure if he want long term dialysis right now or not.  We discussed the process of fistula creation and he is left handed dominant.  He wishes to think about it and decide if he wants to continue dialysis long term or not.   Laurence Slate [facility] 02/14/2015 12:43 PM    Agree with above.  2+ radial and brachial pulse bilaterally.  IVs in both arms Will await vein mapping then decide on  fistula vs graft most likely go to right arm since non dominant Procedure details of fistula and graft discussed with patient He has still not really adjusted to thought of lifetime dialysis  Ruta Hinds, MD [facility] [place]: [phone] Pager: [phone]

## 2015-02-14 NOTE — Progress Notes (Signed)
TPE completed with no complications. Pt alert, vss. Report called and patient returned safely to room.

## 2015-02-14 NOTE — Progress Notes (Signed)
Patient ID: Travis [name], male   DOB: 05-24-1941, 74 y.o.   MRN: [id number]  [facility] Progress Note   Assessment/ Plan:   1. 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 Last TPE today-  Without renal recovery yet and sensitized that he may need HD access prior to DC. 2. 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 Hypertension: Blood pressures acceptable on hydralazine, nifedipine XL and furosemide-patient not too enthusiastic about low sodium diet 4. Right lower extremity DVT: Anticoagulation discontinued due to perinephric hemorrhage status post kidney biopsy. Status post IVC filter  Subjective:   Feeling fair- had some problems with leg discomfort during ambulation yesterday   Objective:   BP 149/79 mmHg  Pulse 97  Temp(Src) 99.1 F (37.3 C) (Oral)  Resp 18  Ht 5\' 10"  (1.778 m)  Wt 96.1 kg (211 lb 13.8 oz)  BMI 30.40 kg/m2  SpO2 100%  Intake/Output Summary (Last 24 hours) at 02/14/15 0831 Last data filed at 02/14/15 0500  Gross per 24 hour  Intake    263 ml  Output   1600 ml  Net  -1337 ml   Weight change: -0.1 kg (-3.5 oz)  Physical Exam: Gen: comfortably resting in bed [id number] RRR, normal s1 and s2 Resp:CTA bilaterally, no rales/rhonchi [id number], flat, NT Ext: 2+ LE edema bilaterally  Imaging: No results found.  Labs: BMET  Recent Labs Lab 02/08/15 0247  02/09/15 0331 02/10/15 0414  02/11/15 0334 02/11/15 1611 02/12/15 0500 02/13/15 0452 02/13/15 0457 02/13/15 1532 02/14/15 0604  NA 134*  < > 138 137  < > 135 135 137 138 138 137 140  K 4.1  < > 3.6 3.5  < > 3.3* 4.0 3.7 3.9 3.9 4.2 4.0  CL 103  < > 102 101  < > 96* 92* 93* 93* 94* 91* 94*  CO2 22  --  25 25  --  29  --  32 33* 32  --  32  GLUCOSE 118*  < > 118* 109*  < > 101* 152* 102* 101* 102* 128* 93  BUN 80*   < > 78* 72*  < > 68* 73* 64* 59* 58* 64* 55*  CREATININE 6.18*  < > 6.43* 6.38*  < > 6.55* 6.30* 6.81* 6.86* 6.73* 6.50* 6.93*  CALCIUM 8.0*  --  8.1* 8.1*  --  8.0*  --  8.3* 8.5* 8.4*  --  8.2*  PHOS 6.7*  --  6.4* 5.9*  --  5.6*  --  5.7* 5.6*  --   --  5.9*  < > = values in this interval not displayed. CBC  Recent Labs Lab 02/11/15 0334  02/12/15 0500 02/13/15 0457 02/13/15 1532 02/14/15 0604  WBC 15.5*  --  16.0* 15.8*  --  15.1*  HGB 7.3*  < > 7.2* 7.6* 8.8* 7.3*  HCT 23.2*  < > 23.2* 25.0* 26.0* 23.5*  MCV 99.1  --  100.9* 102.5*  --  103.1*  PLT 89*  --  94* 128*  --  143*  < > = values in this interval not displayed.  Medications:    . anticoagulant sodium citrate  5 mL Intracatheter Once  . calcium carbonate      . calcium carbonate  2 tablet Oral Q3H  . calcium gluconate IVPB  4 g Intravenous Once  . citrate dextrose      .  cyanocobalamin  500 mcg Oral Daily  . docusate sodium  100 mg Oral BID  . feeding supplement (NEPRO CARB STEADY)  237 mL Oral BID BM  . ferrous sulfate  325 mg Oral BID WC  . insulin aspart  0-9 Units Subcutaneous TID WC  . NIFEdipine  30 mg Oral Daily  . pantoprazole  40 mg Oral Daily  . polyethylene glycol  17 g Oral Daily  . sodium chloride  3 mL Intravenous Q12H   Elmarie Shiley, MD 02/14/2015, 8:31 AM

## 2015-02-14 NOTE — Care Management Note (Signed)
Case Management Note  Patient Details  Name: Travis [name] MRN: [id number] Date of Birth: 1940/12/26  Subjective/Objective:      Pt admitted with confirmed DVT              Action/Plan:  Anticoagulation planned during admit.  CM consulted for lovenox discharge planning.  CM will continue to monitor for disposition needs.   Expected Discharge Date:                  Expected Discharge Plan:  Home with [facility] In-House Referral:     Discharge planning Services  CM Consult, Medication Assistance  Post Acute Care Choice:  Resumption of Svcs/PTA Provider Choice offered to:     DME Arranged:     DME Agency:     HH Arranged:  RN, PTRN, PT resumption of services [place] Agency:  [facility]  Status of Service:   In process, will continue to follow  Medicare Important Message Given:  Yes Date Medicare IM Given:  02/13/15 Medicare IM give by:  Elenor Quinones Date Additional Medicare IM Given:  02/14/15 Additional Medicare Important Message give by:  Elenor Quinones  If discussed at Long Length of Stay Meetings, dates discussed:  02/13/15  Additional Comments: 01/31/15 Elenor Quinones, RN, BSN [phone]  01/31/15 Elenor Quinones, RN, BSN  [phone].  Wife successfully picked up prescription from pharmacy, instructed by CM to hold use of medication until post discharge/discharge instructions.  Wife stated she understood and medication would be taken home.  No other CM needs at this time.  01/31/15 Elenor Quinones, RN, BSN [phone].  Patient does not have prescription medication coverage, amount due for pt would have caused hardship.  MD requested to write prescription to be faxed to [facility] (cost of $30.62).  Prescription faxed, Gerald Stabs at pharmacy received prescription, currently working on filling.  Wife is on way to pick up prescription for pt and will keep pending discharge/discharge instructions.  MD instructed nurse to order  resumption of [facility] services with [facility] (RN,PT).  Pt has home 02, states he has transport tank in room for discharge.  No additional CM needs at this time.  01/30/15  Elenor Quinones, RN, BSN [phone].  CM submitted benefits check for Lovenox.  Pt states he already has walker and toilet chair and is currently active with [facility].  CM contacted [facility] to make aware of pt admission; pt receives home O2, RN and PT from [facility].  CM will request resumption of orders from MD.  CM was informed by pt that he has VA benefits.  CM contacted [facility] and spoke with Mickel Baas; pt is active for 40% coverage as it relates to pts prior approved condition with his knee only.   Pt states he lost his VA card.  Pt states he has a PCP at the [facility] clinic; Dr. Dell Ponto phone number [phone], last visited 5/12, this information was confirmed.  CM will continue to monitor for disposition needs.  Maryclare Labrador, RN 02/14/2015, 11:09 AM

## 2015-02-15 ENCOUNTER — Inpatient Hospital Stay (HOSPITAL_COMMUNITY): Payer: Medicare Other

## 2015-02-15 DIAGNOSIS — N186 End stage renal disease: Secondary | ICD-10-CM

## 2015-02-15 DIAGNOSIS — I82402 Acute embolism and thrombosis of unspecified deep veins of left lower extremity: Secondary | ICD-10-CM

## 2015-02-15 LAB — THERAPEUTIC PLASMA EXCHANGE (BLOOD BANK)
Plasma volume needed: 3800
UNIT DIVISION: 0
UNIT DIVISION: 0
UNIT DIVISION: 0
UNIT DIVISION: 0
UNIT DIVISION: 0
Unit division: 0
Unit division: 0
Unit division: 0
Unit division: 0
Unit division: 0
Unit division: 0
Unit division: 0
Unit division: 0
Unit division: 0

## 2015-02-15 LAB — CBC
HEMATOCRIT: 23.9 % — AB (ref 39.0–52.0)
Hemoglobin: 7.3 g/dL — ABNORMAL LOW (ref 13.0–17.0)
MCH: 31.5 pg (ref 26.0–34.0)
MCHC: 30.5 g/dL (ref 30.0–36.0)
MCV: 103 fL — AB (ref 78.0–100.0)
PLATELETS: 143 10*3/uL — AB (ref 150–400)
RBC: 2.32 MIL/uL — ABNORMAL LOW (ref 4.22–5.81)
RDW: 19.7 % — ABNORMAL HIGH (ref 11.5–15.5)
WBC: 14.8 10*3/uL — AB (ref 4.0–10.5)

## 2015-02-15 LAB — GLUCOSE, CAPILLARY
GLUCOSE-CAPILLARY: 100 mg/dL — AB (ref 65–99)
Glucose-Capillary: 106 mg/dL — ABNORMAL HIGH (ref 65–99)
Glucose-Capillary: 115 mg/dL — ABNORMAL HIGH (ref 65–99)
Glucose-Capillary: 99 mg/dL (ref 65–99)

## 2015-02-15 LAB — RENAL FUNCTION PANEL
ALBUMIN: 2.8 g/dL — AB (ref 3.5–5.0)
Anion gap: 11 (ref 5–15)
BUN: 49 mg/dL — AB (ref 6–20)
CHLORIDE: 92 mmol/L — AB (ref 101–111)
CO2: 36 mmol/L — ABNORMAL HIGH (ref 22–32)
Calcium: 8.2 mg/dL — ABNORMAL LOW (ref 8.9–10.3)
Creatinine, Ser: 6.96 mg/dL — ABNORMAL HIGH (ref 0.61–1.24)
GFR calc Af Amer: 8 mL/min — ABNORMAL LOW (ref >60)
GFR calc non Af Amer: 7 mL/min — ABNORMAL LOW (ref >60)
Glucose, Bld: 96 mg/dL (ref 65–99)
POTASSIUM: 4.1 mmol/L (ref 3.5–5.1)
Phosphorus: 5.9 mg/dL — ABNORMAL HIGH (ref 2.5–4.6)
SODIUM: 139 mmol/L (ref 135–145)

## 2015-02-15 LAB — MAGNESIUM: MAGNESIUM: 1.7 mg/dL (ref 1.7–2.4)

## 2015-02-15 MED ORDER — FUROSEMIDE 10 MG/ML IJ SOLN
40.0000 mg | Freq: Two times a day (BID) | INTRAMUSCULAR | Status: DC
  Administered 2015-02-15 – 2015-02-16 (×4): 40 mg via INTRAVENOUS
  Filled 2015-02-15 (×7): qty 4

## 2015-02-15 MED ORDER — BACITRACIN-NEOMYCIN-POLYMYXIN OINTMENT TUBE
TOPICAL_OINTMENT | Freq: Two times a day (BID) | CUTANEOUS | Status: DC
  Administered 2015-02-15 – 2015-02-16 (×2): 1 via TOPICAL
  Administered 2015-02-16 – 2015-02-19 (×3): via TOPICAL
  Filled 2015-02-15 (×2): qty 15

## 2015-02-15 NOTE — Progress Notes (Addendum)
Right  Upper Extremity Vein Map    Cephalic  Segment Diameter Depth Comment  1. Axilla mm mm Thrombosis  2. Mid upper arm mm mm Thrombosis  3. Above AC mm mm Thrombosis  4. In AC 4.6mm mm   5. Below AC 2.5mm mm   6. Mid forearm mm mm Thrombosis  7. Wrist mm mm Not visualized   mm mm    mm mm    mm mm    Basilic  Segment Diameter Depth Comment  1. Axilla mm mm   2. Mid upper arm mm mm Thrombosis  3. Above AC mm mm Thrombosis  4. In AC 2.11mm 2.22mm   5. Below AC 1.9mm 2.24mm   6. Mid forearm mm mm Thrombosis  7. Wrist mm mm Thrombosis  Brachial       AC 4.66mm 41mm   Above left AC 34mm 54mm   Mid left upper arm 4.67mm 51mm   Origin 6.76mm 25mm    Left Upper Extremity Vein Map    Cephalic  Segment Diameter Depth Comment  1. Axilla 2.103mm mm   2. Mid upper arm 2.27mm mm   3. Above AC 2.42mm mm   4. In AC 2.67mm mm   5. Below AC mm mm Bandages/IV  6. Mid forearm mm mm Thrombosis  7. Wrist mm mm Thrombosis   mm mm    mm mm    mm mm    Basilic  Segment Diameter Depth Comment  1. Axilla mm mm   2. Mid upper arm mm mm Not visualized  3. Above AC 2.19mm mm Not visualized  4. In AC mm mm Not visualized  5. Below AC mm mm Not visualized  6. Mid forearm mm mm Thrombosis  7. Wrist mm mm Not visualized  Brachial mm mm   Origin 7.7mm 37mm   Mid upper arm 3.42mm 22mm   Above AC 3.33mm 19mm   AC 3.36mm 64mm       Lou Irigoyen, RVT 02/15/2015, 5:07 PM

## 2015-02-15 NOTE — Progress Notes (Signed)
Patient ID: Travis [name], male   DOB: Jun 05, 1941, 74 y.o.   MRN: [id number]  [facility] Progress Note    Assessment/ Plan:   1. 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 Status post 7 plasmapheresis treatments and following discussions with hematology-no indications for eculizumab. Without renal recovery yet and seen yesterday by vascular surgery for waiting vein mapping to decide on further access plans-he will likely get a right sided fistula versus graft. He does not have acute indications to start dialysis yet. Continue intravenous furosemide 40 mg twice a day with plan to transition to oral furosemide soon. 2. Anemia/thrombocytopenia: thrombocytopenia improved with plasmapheresis, continue to monitor trend  3. Hypertension: Blood pressures acceptable on hydralazine, nifedipine XL and furosemide-patient not too enthusiastic about low sodium diet 4. Right lower extremity DVT: Anticoagulation discontinued due to perinephric hemorrhage status post kidney biopsy. Status post IVC filter  Subjective:    Reports to be feeling better-looking forward to going home early next week (preferably after access surgery)   Objective:   BP 148/77 mmHg  Pulse 93  Temp(Src) 98.7 F (37.1 C) (Oral)  Resp 18  Ht 5\' 10"  (1.778 m)  Wt 97.297 kg (214 lb 8 oz)  BMI 30.78 kg/m2  SpO2 98%  Intake/Output Summary (Last 24 hours) at 02/15/15 0859 Last data filed at 02/15/15 0849  Gross per 24 hour  Intake    493 ml  Output    500 ml  Net     -7 ml   Weight change: 1.197 kg (2 lb 10.2 oz)  Physical Exam: Gen: Comfortably resting in bed CVS: Pulse regular in rate and rhythm, S1 and S2 with ESM Resp: Clear to auscultation, no rales Abd: Soft, obese, nontender Ext: 2+ lower extremity edema  Imaging: No results found.  Labs: BMET  Recent Labs Lab 02/09/15 0331 02/10/15 0414   02/11/15 0334 02/11/15 1611 02/12/15 0500 02/13/15 0452 02/13/15 0457 02/13/15 1532 02/14/15 0604 02/15/15 0352  NA 138 137  < > 135 135 137 138 138 137 140 139  K 3.6 3.5  < > 3.3* 4.0 3.7 3.9 3.9 4.2 4.0 4.1  CL 102 101  < > 96* 92* 93* 93* 94* 91* 94* 92*  CO2 25 25  --  29  --  32 33* 32  --  32 36*  GLUCOSE 118* 109*  < > 101* 152* 102* 101* 102* 128* 93 96  BUN 78* 72*  < > 68* 73* 64* 59* 58* 64* 55* 49*  CREATININE 6.43* 6.38*  < > 6.55* 6.30* 6.81* 6.86* 6.73* 6.50* 6.93* 6.96*  CALCIUM 8.1* 8.1*  --  8.0*  --  8.3* 8.5* 8.4*  --  8.2* 8.2*  PHOS 6.4* 5.9*  --  5.6*  --  5.7* 5.6*  --   --  5.9* 5.9*  < > = values in this interval not displayed. CBC  Recent Labs Lab 02/12/15 0500 02/13/15 0457 02/13/15 1532 02/14/15 0604 02/15/15 0352  WBC 16.0* 15.8*  --  15.1* 14.8*  HGB 7.2* 7.6* 8.8* 7.3* 7.3*  HCT 23.2* 25.0* 26.0* 23.5* 23.9*  MCV 100.9* 102.5*  --  103.1* 103.0*  PLT 94* 128*  --  143* 143*    Medications:    . cyanocobalamin  500 mcg Oral Daily  . docusate sodium  100 mg Oral BID  . feeding supplement (NEPRO CARB STEADY)  237 mL Oral  BID BM  . ferrous sulfate  325 mg Oral BID WC  . insulin aspart  0-9 Units Subcutaneous TID WC  . NIFEdipine  30 mg Oral Daily  . pantoprazole  40 mg Oral Daily  . polyethylene glycol  17 g Oral Daily  . sodium chloride  3 mL Intravenous Q12H   Elmarie Shiley, MD 02/15/2015, 8:59 AM

## 2015-02-15 NOTE — Consult Note (Signed)
Awaiting vein map for access planning will have further discussions when vein map complete  Ruta Hinds, MD [facility] [place] Office: [phone] Pager: [phone]

## 2015-02-15 NOTE — Progress Notes (Addendum)
[name] Interim History: 74 y.o. year-old with hx of DJD and GERD, otherwise healthy until April 2016 in hospital twice with pulm infiltrates (pna vs fibrosis), cough, SOB, LE edema. Work up was ANA negative but was + for RF, antiRo and antiLa and for ANCA. He has been treated with some oral prednisone. He was ordered for home O2 by his PCP the week prior to admission. On 5/18 presented to ED with severe fatigue, LE edema. No SOB or CP. Was seen in pulm clinic and dopplers were + for DVT R leg below the knee. Admitted and started on IV heparin. Creat was up, new since last hosp stay. Patient received 3 days of high dose solumedrol the prednisone tapered down. Renal on board. Renal biopsy shows thrombotic microangiopathy that at this time appears that it might be related to hypertensive emergency versus atypical HUS. Was found to be in flank pain Hbg drop so heparin stopped, IV filter placed. CT abd pelvis showed large perinephric hemorrhage. Underwent plasmapheresis x 7 days, last one 6/3   HPI/Subjective: - no complaints, eating breakfast - no chest pain, shortness of breath, no abdominal pain, nausea or vomiting.    Assessment/Plan:  Acute on chronic kidney disease stage IV: - Renal consulted and patient underwent a renal biopsy on 5.25.2016 by IR. - biopsy showed thrombotic microangiopathy appearance, ADAMTS-13 assay mildly decreased, . He has empirically been started on plasmapheresis, and tapering down steroids, s/p 7 treatments total last one was on 6/3 - ANA negative, Centromere negative. Anti-scleroderma 70 antibodies negative. - Cont IV lasix per nephrology - will likely need HD, vascular surgery following, awaiting vein mapping  Renal hematoma: - CT scan of the abdomen and pelvis showed evidence of moderate-sized perinephric hemorrhage post percutaneous biopsy. Anticoagulation stopped and IVC filter was placed by IR. - Hemoglobin has remained stable,  will transfuse if less than 7, continue to monitor.  Acute DVT of right tibial vein with intermediate probability VQ scan: - CT scan of the abdomen and pelvis was done that showed evidence of moderate left perinephric hemorrhage post percutaneous Biopsy.  - dc'd coumadin and heparin, IR was consulted for an IVC filter placement.   Elevated blood pressure without a diagnosis of hypertension: - Continue IV hydralazine  Steroid-induced hyperglycemia: - Now tapering down steroids, last dose 6/2 - A1c was 4.9 and continue sliding scale insulin.  Acute on chronic anemia/acute blood loss anemia: - Continue Protonix.  Physical deconditioning: - HHPT  Sjogren's disease - Follow with rheumatology as an outpatient.   Consultants:  Renal  IR  Vascular surgery   Procedures:  Renal biopsy on 5.26.2016  Antibiotics:  None  Objective: Filed Vitals:   02/14/15 1239 02/14/15 1543 02/14/15 2017 02/15/15 0415  BP: 139/72 158/71 161/75 148/77  Pulse: 95 96 104 93  Temp: 98.5 F (36.9 C)  98.2 F (36.8 C) 98.7 F (37.1 C)  TempSrc: Oral  Oral Oral  Resp: 20 20 19 18   Height:      Weight:    97.297 kg (214 lb 8 oz)  SpO2: 99% 94% 97% 98%    Intake/Output Summary (Last 24 hours) at 02/15/15 1023 Last data filed at 02/15/15 0849  Gross per 24 hour  Intake    493 ml  Output    500 ml  Net     -7 ml   Filed Weights   02/13/15 0606 02/14/15 0638 02/15/15 0415  Weight: 96.3 kg (212 lb 4.9 oz) 96.1  kg (211 lb 13.8 oz) 97.297 kg (214 lb 8 oz)    Exam: General: Alert, awake, oriented x3, in no acute distress, wife bedside Heart: Regular rate and rhythm, no MRG, no JVD, 1-2 + edema Lungs: Good air movement, bibasilar crackles Abdomen: Soft, nontender, nondistended, positive bowel sounds.  Neuro: Grossly intact, nonfocal. Psych: normal mood  Data Reviewed: Basic Metabolic Panel:  Recent Labs Lab 02/11/15 0334  02/12/15 0500 02/13/15 0452 02/13/15 0457 02/13/15 1532  02/14/15 0604 02/15/15 0352  NA 135  < > 137 138 138 137 140 139  K 3.3*  < > 3.7 3.9 3.9 4.2 4.0 4.1  CL 96*  < > 93* 93* 94* 91* 94* 92*  CO2 29  --  32 33* 32  --  32 36*  GLUCOSE 101*  < > 102* 101* 102* 128* 93 96  BUN 68*  < > 64* 59* 58* 64* 55* 49*  CREATININE 6.55*  < > 6.81* 6.86* 6.73* 6.50* 6.93* 6.96*  CALCIUM 8.0*  --  8.3* 8.5* 8.4*  --  8.2* 8.2*  MG  --   --   --   --   --   --   --  1.7  PHOS 5.6*  --  5.7* 5.6*  --   --  5.9* 5.9*  < > = values in this interval not displayed.  Liver Function Tests:  Recent Labs Lab 02/11/15 0334 02/12/15 0500 02/13/15 0452 02/14/15 0604 02/15/15 0352  ALBUMIN 2.7* 2.7* 2.9* 2.7* 2.8*   CBC:  Recent Labs Lab 02/11/15 0334  02/12/15 0500 02/13/15 0457 02/13/15 1532 02/14/15 0604 02/15/15 0352  WBC 15.5*  --  16.0* 15.8*  --  15.1* 14.8*  HGB 7.3*  < > 7.2* 7.6* 8.8* 7.3* 7.3*  HCT 23.2*  < > 23.2* 25.0* 26.0* 23.5* 23.9*  MCV 99.1  --  100.9* 102.5*  --  103.1* 103.0*  PLT 89*  --  94* 128*  --  143* 143*  < > = values in this interval not displayed. BNP (last 3 results)  Recent Labs  12/17/14 1934 01/02/15 1241 01/06/15 1758  BNP 38.6 66.6 83.3   CBG:  Recent Labs Lab 02/14/15 0549 02/14/15 1330 02/14/15 1654 02/14/15 2103 02/15/15 0600  GLUCAP 110* 109* 96 120* 106*   Studies: No results found.  Scheduled Meds: . cyanocobalamin  500 mcg Oral Daily  . docusate sodium  100 mg Oral BID  . feeding supplement (NEPRO CARB STEADY)  237 mL Oral BID BM  . ferrous sulfate  325 mg Oral BID WC  . furosemide  40 mg Intravenous BID  . insulin aspart  0-9 Units Subcutaneous TID WC  . NIFEdipine  30 mg Oral Daily  . pantoprazole  40 mg Oral Daily  . polyethylene glycol  17 g Oral Daily  . sodium chloride  3 mL Intravenous Q12H   Continuous Infusions: . sodium chloride Stopped (02/01/15 1719)   Time spent: 25 minutes > 50% bedside discussing with patient and his wife  Marzetta Board  [facility] Pager [phone]. If 7PM-7AM, please contact night-coverage at [email or web address], password [facility] 02/15/2015, 10:23 AM  LOS: 17 days

## 2015-02-15 NOTE — Progress Notes (Addendum)
02/15/2015 6:42 PM Iv therapy recommending neosporin ointment to IV site with drainage. Dr. Cruzita Lederer paged and made aware. Orders received.  Pt. Updated on plan of care. Will continue to monitor patient.  Travis [name], Arville Lime

## 2015-02-15 NOTE — Progress Notes (Signed)
02/15/2015\ 6:14 PM Nursing note Noted pt. LAC PIV site tegaderm appeared visibly soiled and possibly wet or with drainage. Site removed per protocol. Noted underneath catheter insertion site skin breakdown with yellow/brown drainage. Pt. Denies pain. Mild swelling noted at site. Insertion site covered with dry gauze. IV team notified to evaluate site. Dr. Cruzita Lederer paged and made aware of findings. No new orders at this time. Will continue to closely monitor patient.  Herley Bernardini, Arville Lime

## 2015-02-16 LAB — IRON AND TIBC
Iron: 30 ug/dL — ABNORMAL LOW (ref 45–182)
Saturation Ratios: 14 % — ABNORMAL LOW (ref 17.9–39.5)
TIBC: 209 ug/dL — AB (ref 250–450)
UIBC: 179 ug/dL

## 2015-02-16 LAB — RENAL FUNCTION PANEL
ALBUMIN: 2.7 g/dL — AB (ref 3.5–5.0)
ANION GAP: 13 (ref 5–15)
BUN: 52 mg/dL — ABNORMAL HIGH (ref 6–20)
CALCIUM: 8.3 mg/dL — AB (ref 8.9–10.3)
CO2: 33 mmol/L — ABNORMAL HIGH (ref 22–32)
CREATININE: 7.8 mg/dL — AB (ref 0.61–1.24)
Chloride: 93 mmol/L — ABNORMAL LOW (ref 101–111)
GFR calc Af Amer: 7 mL/min — ABNORMAL LOW (ref >60)
GFR, EST NON AFRICAN AMERICAN: 6 mL/min — AB (ref >60)
Glucose, Bld: 96 mg/dL (ref 65–99)
PHOSPHORUS: 5.7 mg/dL — AB (ref 2.5–4.6)
Potassium: 4.2 mmol/L (ref 3.5–5.1)
SODIUM: 139 mmol/L (ref 135–145)

## 2015-02-16 LAB — FERRITIN: FERRITIN: 630 ng/mL — AB (ref 24–336)

## 2015-02-16 LAB — GLUCOSE, CAPILLARY
Glucose-Capillary: 97 mg/dL (ref 65–99)
Glucose-Capillary: 111 mg/dL — ABNORMAL HIGH (ref 65–99)
Glucose-Capillary: 105 mg/dL — ABNORMAL HIGH (ref 65–99)
Glucose-Capillary: 107 mg/dL — ABNORMAL HIGH (ref 65–99)

## 2015-02-16 MED ORDER — CEFUROXIME SODIUM 1.5 G IJ SOLR: 1.5000 g | INTRAMUSCULAR | Status: DC

## 2015-02-16 MED ORDER — DARBEPOETIN ALFA 60 MCG/0.3ML IJ SOSY
60.0000 ug | PREFILLED_SYRINGE | INTRAMUSCULAR | Status: DC
  Filled 2015-02-16: qty 0.3

## 2015-02-16 MED ORDER — CEFUROXIME SODIUM 1.5 G IJ SOLR
1.5000 g | INTRAMUSCULAR | Status: AC
  Administered 2015-02-17: 1.5 g via INTRAVENOUS
  Filled 2015-02-16 (×2): qty 1.5

## 2015-02-16 NOTE — Consult Note (Signed)
[facility] [place]  Subjective  - feels ok   Objective 154/83 94 98.5 F (36.9 C) (Oral) 16 92%  Intake/Output Summary (Last 24 hours) at 02/16/15 1127 Last data filed at 02/15/15 2043  Gross per 24 hour  Intake      0 ml  Output    525 ml  Net   -525 ml   2+ radial brachial pulse bilaterally IV right arm  Assessment/Planning: Vein map reviewed.  No adequate veins for fistula.  Plan discussed with pt   Will place diatek catheter and right arm AV graft tomorrow NPO p midnight Cefuroxime on call Consent Needs all IVs out of right arm  Ruta Hinds, MD [facility] [place]: [phone] Pager: [phone]  Ruta Hinds 02/16/2015 11:27 AM --  Laboratory Lab Results:  Recent Labs  02/14/15 0604 02/15/15 0352  WBC 15.1* 14.8*  HGB 7.3* 7.3*  HCT 23.5* 23.9*  PLT 143* 143*   BMET  Recent Labs  02/15/15 0352 02/16/15 0530  NA 139 139  K 4.1 4.2  CL 92* 93*  CO2 36* 33*  GLUCOSE 96 96  BUN 49* 52*  CREATININE 6.96* 7.80*  CALCIUM 8.2* 8.3*    COAG Lab Results  Component Value Date   INR 1.23 02/11/2015   INR 1.26 02/10/2015   INR 1.44 02/09/2015   No results found for: PTT

## 2015-02-16 NOTE — Progress Notes (Signed)
[name] Interim History: 74 y.o. year-old with hx of DJD and GERD, otherwise healthy until April 2016 in hospital twice with pulm infiltrates (pna vs fibrosis), cough, SOB, LE edema. Work up was ANA negative but was + for RF, antiRo and antiLa and for ANCA. He has been treated with some oral prednisone. He was ordered for home O2 by his PCP the week prior to admission. On 5/18 presented to ED with severe fatigue, LE edema. No SOB or CP. Was seen in pulm clinic and dopplers were + for DVT R leg below the knee. Admitted and started on IV heparin. Creat was up, new since last hosp stay. Patient received 3 days of high dose solumedrol the prednisone tapered down. Renal on board. Renal biopsy shows thrombotic microangiopathy that at this time appears that it might be related to hypertensive emergency versus atypical HUS. Was found to be in flank pain Hbg drop so heparin stopped, IV filter placed. CT abd pelvis showed large perinephric hemorrhage. Underwent plasmapheresis x 7 days, last one 6/3  HPI/Subjective: - no complaints, eating breakfast - no chest pain, shortness of breath, no abdominal pain, nausea or vomiting.   Assessment/Plan:  Acute on chronic kidney disease stage IV: - Renal consulted and patient underwent a renal biopsy on 5.25.2016 by IR. - biopsy showed thrombotic microangiopathy appearance, ADAMTS-13 assay mildly decreased, . He has empirically been started on plasmapheresis, and tapering down steroids, s/p 7 treatments total last one was on 6/3 - ANA negative, Centromere negative. Anti-scleroderma 70 antibodies negative. - Cont IV lasix per nephrology - renal function continues to get worse, likely will need HD soon, I discussed with Dr. Posey Pronto, start HD tomorrow  Renal hematoma: - CT scan of the abdomen and pelvis showed evidence of moderate-sized perinephric hemorrhage post percutaneous biopsy. Anticoagulation stopped and IVC filter was placed by  IR. - Hemoglobin has remained stable, will transfuse if less than 7, continue to monitor.  Acute DVT of right tibial vein with intermediate probability VQ scan: - CT scan of the abdomen and pelvis was done that showed evidence of moderate left perinephric hemorrhage post percutaneous Biopsy.  - dc'd coumadin and heparin, IR was consulted for an IVC filter placement.   Elevated blood pressure without a diagnosis of hypertension: - Continue IV hydralazine as needed - he is on Nifedipine  Steroid-induced hyperglycemia: - off steroids as of 6/2 - A1c was 4.9 and continue sliding scale insulin. - CBGs well controlled  Acute on chronic anemia/acute blood loss anemia: - Continue Protonix.  Physical deconditioning: - HHPT  Sjogren's disease - Follow with rheumatology as an outpatient.   Consultants:  Renal  IR  Vascular surgery   Procedures:  Renal biopsy on 5.26.2016  Antibiotics:  None  Objective: Filed Vitals:   02/15/15 0415 02/15/15 1423 02/15/15 2036 02/16/15 0538  BP: 148/77 162/79 153/80 154/83  Pulse: 93 106 102 94  Temp: 98.7 F (37.1 C) 98.2 F (36.8 C) 98.5 F (36.9 C) 98.5 F (36.9 C)  TempSrc: Oral Oral Oral Oral  Resp: 18 18 20 16   Height:      Weight: 97.297 kg (214 lb 8 oz)   96.525 kg (212 lb 12.8 oz)  SpO2: 98% 95% 95% 92%    Intake/Output Summary (Last 24 hours) at 02/16/15 0755 Last data filed at 02/15/15 2043  Gross per 24 hour  Intake    370 ml  Output   1025 ml  Net   -655 ml  Filed Weights   02/14/15 0638 02/15/15 0415 02/16/15 0538  Weight: 96.1 kg (211 lb 13.8 oz) 97.297 kg (214 lb 8 oz) 96.525 kg (212 lb 12.8 oz)    Exam: General: Alert, awake, oriented x3, in no acute distress, wife bedside Heart: Regular rate and rhythm, no MRG, no JVD, 1-2 + edema Lungs: Good air movement, bibasilar crackles Abdomen: Soft, nontender, nondistended, positive bowel sounds.  Neuro: Grossly intact, nonfocal. Psych: normal mood  Data  Reviewed: Basic Metabolic Panel:  Recent Labs Lab 02/12/15 0500 02/13/15 0452 02/13/15 0457 02/13/15 1532 02/14/15 0604 02/15/15 0352 02/16/15 0530  NA 137 138 138 137 140 139 139  K 3.7 3.9 3.9 4.2 4.0 4.1 4.2  CL 93* 93* 94* 91* 94* 92* 93*  CO2 32 33* 32  --  32 36* 33*  GLUCOSE 102* 101* 102* 128* 93 96 96  BUN 64* 59* 58* 64* 55* 49* 52*  CREATININE 6.81* 6.86* 6.73* 6.50* 6.93* 6.96* 7.80*  CALCIUM 8.3* 8.5* 8.4*  --  8.2* 8.2* 8.3*  MG  --   --   --   --   --  1.7  --   PHOS 5.7* 5.6*  --   --  5.9* 5.9* 5.7*    Liver Function Tests:  Recent Labs Lab 02/12/15 0500 02/13/15 0452 02/14/15 0604 02/15/15 0352 02/16/15 0530  ALBUMIN 2.7* 2.9* 2.7* 2.8* 2.7*   CBC:  Recent Labs Lab 02/11/15 0334  02/12/15 0500 02/13/15 0457 02/13/15 1532 02/14/15 0604 02/15/15 0352  WBC 15.5*  --  16.0* 15.8*  --  15.1* 14.8*  HGB 7.3*  < > 7.2* 7.6* 8.8* 7.3* 7.3*  HCT 23.2*  < > 23.2* 25.0* 26.0* 23.5* 23.9*  MCV 99.1  --  100.9* 102.5*  --  103.1* 103.0*  PLT 89*  --  94* 128*  --  143* 143*  < > = values in this interval not displayed. BNP (last 3 results)  Recent Labs  12/17/14 1934 01/02/15 1241 01/06/15 1758  BNP 38.6 66.6 83.3   CBG:  Recent Labs Lab 02/15/15 0600 02/15/15 1128 02/15/15 1617 02/15/15 2216 02/16/15 0630  GLUCAP 106* 100* 115* 99 97   Studies: No results found.  Scheduled Meds: . cyanocobalamin  500 mcg Oral Daily  . docusate sodium  100 mg Oral BID  . feeding supplement (NEPRO CARB STEADY)  237 mL Oral BID BM  . ferrous sulfate  325 mg Oral BID WC  . furosemide  40 mg Intravenous BID  . insulin aspart  0-9 Units Subcutaneous TID WC  . neomycin-bacitracin-polymyxin   Topical BID  . NIFEdipine  30 mg Oral Daily  . pantoprazole  40 mg Oral Daily  . polyethylene glycol  17 g Oral Daily  . sodium chloride  3 mL Intravenous Q12H   Continuous Infusions: . sodium chloride Stopped (02/01/15 1719)    Marzetta Board  [facility] Pager [phone]. If 7PM-7AM, please contact night-coverage at [email or web address], password [facility] 02/16/2015, 7:55 AM  LOS: 18 days

## 2015-02-16 NOTE — Progress Notes (Signed)
Patient ID: Travis [name], male   DOB: 15-Nov-1940, 74 y.o.   MRN: [id number]   [facility] Progress Note   Assessment/ Plan:   1. 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 Status post 7 plasmapheresis treatments and following discussions with hematology-no indications for eculizumab. No evidence of renal recovery and continued worsening of creatinine/renal function daily. Discussed situation with the patient and decision made to start chronic/maintenance hemodialysis beginning tomorrow together with placement of an outpatient dialysis unit. Vascular surgery involved for permanent access planning-vein mapping noted and significant for thrombosed veins both extremities probably from recurrent hospitalizations/cannulation. 2. Anemia/thrombocytopenia: thrombocytopenia improved with plasmapheresis, recheck iron stores (depressed in April) and re-dose IV iron/Aranesp 3. Hypertension: Blood pressures acceptable on hydralazine, nifedipine XL and furosemide-anticipate will improve further with ultrafiltration and hemodialysis 4. Right lower extremity DVT: Anticoagulation discontinued due to perinephric hemorrhage status post kidney biopsy. Status post IVC filter  Subjective:   Reports to be feeling fair-denies any chest pain but has minimal shortness of breath on exertion. Continues to have pedal edema.    Objective:   BP 154/83 mmHg  Pulse 94  Temp(Src) 98.5 F (36.9 C) (Oral)  Resp 16  Ht 5\' 10"  (1.778 m)  Wt 96.525 kg (212 lb 12.8 oz)  BMI 30.53 kg/m2  SpO2 92%  Intake/Output Summary (Last 24 hours) at 02/16/15 0916 Last data filed at 02/15/15 2043  Gross per 24 hour  Intake      0 ml  Output   1025 ml  Net  -1025 ml   Weight change: -0.771 kg (-1 lb 11.2 oz)  Physical Exam: Gen: Comfortably resting in bed, wife at bedside CVS: Pulse regular in rate and rhythm, S1 and S2  normal Resp: Coarse breath sounds-no distinct rales Abd: Soft, obese, nontender Ext: 2+ right lower extremity edema, 1-2+ left lower extremity edema  Imaging: No results found.  Labs: BMET  Recent Labs Lab 02/10/15 0414  02/11/15 0334  02/12/15 0500 02/13/15 0452 02/13/15 0457 02/13/15 1532 02/14/15 0604 02/15/15 0352 02/16/15 0530  NA 137  < > 135  < > 137 138 138 137 140 139 139  K 3.5  < > 3.3*  < > 3.7 3.9 3.9 4.2 4.0 4.1 4.2  CL 101  < > 96*  < > 93* 93* 94* 91* 94* 92* 93*  CO2 25  --  29  --  32 33* 32  --  32 36* 33*  GLUCOSE 109*  < > 101*  < > 102* 101* 102* 128* 93 96 96  BUN 72*  < > 68*  < > 64* 59* 58* 64* 55* 49* 52*  CREATININE 6.38*  < > 6.55*  < > 6.81* 6.86* 6.73* 6.50* 6.93* 6.96* 7.80*  CALCIUM 8.1*  --  8.0*  --  8.3* 8.5* 8.4*  --  8.2* 8.2* 8.3*  PHOS 5.9*  --  5.6*  --  5.7* 5.6*  --   --  5.9* 5.9* 5.7*  < > = values in this interval not displayed. CBC  Recent Labs Lab 02/12/15 0500 02/13/15 0457 02/13/15 1532 02/14/15 0604 02/15/15 0352  WBC 16.0* 15.8*  --  15.1* 14.8*  HGB 7.2* 7.6* 8.8* 7.3* 7.3*  HCT 23.2* 25.0* 26.0* 23.5* 23.9*  MCV 100.9* 102.5*  --  103.1* 103.0*  PLT 94* 128*  --  143* 143*    Medications:    . cyanocobalamin  500 mcg Oral Daily  . docusate sodium  100 mg Oral BID  . feeding supplement (NEPRO CARB STEADY)  237 mL Oral BID BM  . ferrous sulfate  325 mg Oral BID WC  . furosemide  40 mg Intravenous BID  . insulin aspart  0-9 Units Subcutaneous TID WC  . neomycin-bacitracin-polymyxin   Topical BID  . NIFEdipine  30 mg Oral Daily  . pantoprazole  40 mg Oral Daily  . polyethylene glycol  17 g Oral Daily  . sodium chloride  3 mL Intravenous Q12H   Elmarie Shiley, MD 02/16/2015, 9:16 AM

## 2015-02-17 ENCOUNTER — Encounter (HOSPITAL_COMMUNITY): Payer: Self-pay | Admitting: Certified Registered Nurse Anesthetist

## 2015-02-17 ENCOUNTER — Inpatient Hospital Stay (HOSPITAL_COMMUNITY): Payer: Medicare Other

## 2015-02-17 ENCOUNTER — Encounter (HOSPITAL_COMMUNITY)
Admission: EM | Disposition: A | Discharge status: Home Health | Payer: Self-pay | Source: Home / Self Care | Attending: Internal Medicine

## 2015-02-17 ENCOUNTER — Encounter (HOSPITAL_COMMUNITY): Payer: Medicare Other | Admitting: Certified Registered Nurse Anesthetist

## 2015-02-17 HISTORY — PX: INSERTION OF DIALYSIS CATHETER: SHX1324

## 2015-02-17 HISTORY — PX: AV FISTULA PLACEMENT: SHX1204

## 2015-02-17 LAB — GLUCOSE, CAPILLARY
Glucose-Capillary: 103 mg/dL — ABNORMAL HIGH (ref 65–99)
Glucose-Capillary: 104 mg/dL — ABNORMAL HIGH (ref 65–99)
Glucose-Capillary: 85 mg/dL (ref 65–99)
Glucose-Capillary: 91 mg/dL (ref 65–99)

## 2015-02-17 LAB — CBC
HEMATOCRIT: 23.9 % — AB (ref 39.0–52.0)
HEMOGLOBIN: 7.4 g/dL — AB (ref 13.0–17.0)
MCH: 31.2 pg (ref 26.0–34.0)
MCHC: 31 g/dL (ref 30.0–36.0)
MCV: 100.8 fL — ABNORMAL HIGH (ref 78.0–100.0)
Platelets: 165 10*3/uL (ref 150–400)
RBC: 2.37 MIL/uL — ABNORMAL LOW (ref 4.22–5.81)
RDW: 18.1 % — ABNORMAL HIGH (ref 11.5–15.5)
WBC: 13.6 10*3/uL — AB (ref 4.0–10.5)

## 2015-02-17 LAB — BASIC METABOLIC PANEL
Anion gap: 12 (ref 5–15)
BUN: 54 mg/dL — ABNORMAL HIGH (ref 6–20)
CALCIUM: 8.3 mg/dL — AB (ref 8.9–10.3)
CO2: 33 mmol/L — ABNORMAL HIGH (ref 22–32)
Chloride: 91 mmol/L — ABNORMAL LOW (ref 101–111)
Creatinine, Ser: 8.44 mg/dL — ABNORMAL HIGH (ref 0.61–1.24)
GFR calc Af Amer: 6 mL/min — ABNORMAL LOW (ref >60)
GFR calc non Af Amer: 6 mL/min — ABNORMAL LOW (ref >60)
GLUCOSE: 89 mg/dL (ref 65–99)
Potassium: 4.3 mmol/L (ref 3.5–5.1)
Sodium: 136 mmol/L (ref 135–145)

## 2015-02-17 SURGERY — INSERTION OF DIALYSIS CATHETER
Anesthesia: General | Site: Neck | Laterality: Right

## 2015-02-17 MED ORDER — HEPARIN SODIUM (PORCINE) 1000 UNIT/ML DIALYSIS: 1000.0000 [IU] | INTRAMUSCULAR | Status: DC | PRN

## 2015-02-17 MED ORDER — HEPARIN SODIUM (PORCINE) 1000 UNIT/ML IJ SOLN
INTRAMUSCULAR | Status: AC
  Filled 2015-02-17: qty 1

## 2015-02-17 MED ORDER — MIDAZOLAM HCL 2 MG/2ML IJ SOLN
INTRAMUSCULAR | Status: AC
  Filled 2015-02-17: qty 2

## 2015-02-17 MED ORDER — RENA-VITE PO TABS
1.0000 | ORAL_TABLET | Freq: Every day | ORAL | Status: DC
Start: 2015-02-17 — End: 2015-02-19
  Administered 2015-02-17 – 2015-02-18 (×2): 1 via ORAL
  Filled 2015-02-17 (×3): qty 1

## 2015-02-17 MED ORDER — PROTAMINE SULFATE 10 MG/ML IV SOLN
INTRAVENOUS | Status: DC | PRN
  Administered 2015-02-17: 50 mg via INTRAVENOUS

## 2015-02-17 MED ORDER — HEPARIN SODIUM (PORCINE) 1000 UNIT/ML IJ SOLN
INTRAMUSCULAR | Status: DC | PRN
  Administered 2015-02-17: 5000 [IU] via INTRAVENOUS

## 2015-02-17 MED ORDER — LIDOCAINE HCL (PF) 1 % IJ SOLN: 5.0000 mL | INTRAMUSCULAR | Status: DC | PRN

## 2015-02-17 MED ORDER — SODIUM CHLORIDE 0.9 % IV SOLN: 100.0000 mL | INTRAVENOUS | Status: DC | PRN

## 2015-02-17 MED ORDER — HEPARIN SODIUM (PORCINE) 5000 UNIT/ML IJ SOLN
INTRAMUSCULAR | Status: DC | PRN
  Administered 2015-02-17: 500 mL

## 2015-02-17 MED ORDER — HEPARIN SODIUM (PORCINE) 1000 UNIT/ML IJ SOLN
INTRAMUSCULAR | Status: DC | PRN
  Administered 2015-02-17: 4.6 mL

## 2015-02-17 MED ORDER — PROPOFOL 10 MG/ML IV BOLUS
INTRAVENOUS | Status: DC | PRN
  Administered 2015-02-17 (×3): 20 mg via INTRAVENOUS
  Administered 2015-02-17: 100 mg via INTRAVENOUS

## 2015-02-17 MED ORDER — ONDANSETRON HCL 4 MG/2ML IJ SOLN
INTRAMUSCULAR | Status: AC
  Filled 2015-02-17: qty 2

## 2015-02-17 MED ORDER — DEXAMETHASONE SODIUM PHOSPHATE 4 MG/ML IJ SOLN
INTRAMUSCULAR | Status: DC | PRN
  Administered 2015-02-17: 4 mg via INTRAVENOUS

## 2015-02-17 MED ORDER — ALTEPLASE 2 MG IJ SOLR: 2.0000 mg | Freq: Once | INTRAMUSCULAR | Status: DC | PRN

## 2015-02-17 MED ORDER — MIDAZOLAM HCL 5 MG/5ML IJ SOLN
INTRAMUSCULAR | Status: DC | PRN
  Administered 2015-02-17: 2 mg via INTRAVENOUS

## 2015-02-17 MED ORDER — THROMBIN 20000 UNITS EX SOLR
CUTANEOUS | Status: AC
  Filled 2015-02-17: qty 20000

## 2015-02-17 MED ORDER — ONDANSETRON HCL 4 MG/2ML IJ SOLN
INTRAMUSCULAR | Status: DC | PRN
  Administered 2015-02-17: 4 mg via INTRAVENOUS

## 2015-02-17 MED ORDER — ROCURONIUM BROMIDE 50 MG/5ML IV SOLN
INTRAVENOUS | Status: AC
  Filled 2015-02-17: qty 1

## 2015-02-17 MED ORDER — LIDOCAINE-PRILOCAINE 2.5-2.5 % EX CREA: 1.0000 "application " | TOPICAL_CREAM | CUTANEOUS | Status: DC | PRN

## 2015-02-17 MED ORDER — 0.9 % SODIUM CHLORIDE (POUR BTL) OPTIME
TOPICAL | Status: DC | PRN
  Administered 2015-02-17: 1000 mL

## 2015-02-17 MED ORDER — NEPRO/CARBSTEADY PO LIQD
237.0000 mL | ORAL | Status: DC | PRN
Start: 2015-02-17 — End: 2015-02-17

## 2015-02-17 MED ORDER — PROPOFOL 10 MG/ML IV BOLUS
INTRAVENOUS | Status: AC
  Filled 2015-02-17: qty 20

## 2015-02-17 MED ORDER — PENTAFLUOROPROP-TETRAFLUOROETH EX AERO: 1.0000 "application " | INHALATION_SPRAY | CUTANEOUS | Status: DC | PRN

## 2015-02-17 MED ORDER — FENTANYL CITRATE (PF) 100 MCG/2ML IJ SOLN
INTRAMUSCULAR | Status: DC | PRN
  Administered 2015-02-17 (×5): 25 ug via INTRAVENOUS
  Administered 2015-02-17: 50 ug via INTRAVENOUS
  Administered 2015-02-17 (×3): 25 ug via INTRAVENOUS

## 2015-02-17 MED ORDER — THROMBIN 20000 UNITS EX SOLR
CUTANEOUS | Status: DC | PRN
  Administered 2015-02-17: 20 mL via TOPICAL

## 2015-02-17 MED ORDER — SODIUM CHLORIDE 0.9 % IV SOLN
INTRAVENOUS | Status: DC
  Administered 2015-02-17 (×2): via INTRAVENOUS

## 2015-02-17 MED ORDER — FENTANYL CITRATE (PF) 250 MCG/5ML IJ SOLN
INTRAMUSCULAR | Status: AC
  Filled 2015-02-17: qty 5

## 2015-02-17 MED ORDER — LIDOCAINE HCL (CARDIAC) 20 MG/ML IV SOLN
INTRAVENOUS | Status: AC
  Filled 2015-02-17: qty 5

## 2015-02-17 MED ORDER — HYDROMORPHONE HCL 1 MG/ML IJ SOLN: 0.2500 mg | INTRAMUSCULAR | Status: DC | PRN

## 2015-02-17 MED ORDER — FERUMOXYTOL INJECTION 510 MG/17 ML
510.0000 mg | INTRAVENOUS | Status: DC
  Administered 2015-02-17: 510 mg via INTRAVENOUS
  Filled 2015-02-17: qty 17

## 2015-02-17 SURGICAL SUPPLY — 55 items
ARMBAND PINK RESTRICT EXTREMIT (MISCELLANEOUS) ×3 IMPLANT
BAG DECANTER FOR FLEXI CONT (MISCELLANEOUS) ×3 IMPLANT
BIOPATCH RED 1 DISK 7.0 (GAUZE/BANDAGES/DRESSINGS) ×3 IMPLANT
CANISTER SUCTION 2500CC (MISCELLANEOUS) ×3 IMPLANT
CANNULA VESSEL 3MM 2 BLNT TIP (CANNULA) ×1 IMPLANT
CATH CANNON HEMO 15FR 23CM (HEMODIALYSIS SUPPLIES) ×1 IMPLANT
CHLORAPREP W/TINT 26ML (MISCELLANEOUS) ×3 IMPLANT
CLIP TI MEDIUM 6 (CLIP) ×3 IMPLANT
CLIP TI WIDE RED SMALL 6 (CLIP) ×3 IMPLANT
COVER PROBE W GEL 5X96 (DRAPES) ×1 IMPLANT
DECANTER SPIKE VIAL GLASS SM (MISCELLANEOUS) ×3 IMPLANT
DRAPE C-ARM 42X72 X-RAY (DRAPES) ×3 IMPLANT
DRAPE CHEST BREAST 15X10 FENES (DRAPES) ×3 IMPLANT
ELECT REM PT RETURN 9FT ADLT (ELECTROSURGICAL) ×3
ELECTRODE REM PT RTRN 9FT ADLT (ELECTROSURGICAL) ×2 IMPLANT
GAUZE SPONGE 2X2 8PLY STRL LF (GAUZE/BANDAGES/DRESSINGS) ×2 IMPLANT
GAUZE SPONGE 4X4 16PLY XRAY LF (GAUZE/BANDAGES/DRESSINGS) ×4 IMPLANT
GLOVE BIO SURGEON STRL SZ 6.5 (GLOVE) ×1 IMPLANT
GLOVE BIO SURGEON STRL SZ7.5 (GLOVE) ×3 IMPLANT
GLOVE BIOGEL PI IND STRL 6.5 (GLOVE) IMPLANT
GLOVE BIOGEL PI INDICATOR 6.5 (GLOVE) ×3
GLOVE SURG SS PI 7.0 STRL IVOR (GLOVE) ×1 IMPLANT
GOWN STRL REUS W/ TWL LRG LVL3 (GOWN DISPOSABLE) ×6 IMPLANT
GOWN STRL REUS W/ TWL XL LVL3 (GOWN DISPOSABLE) IMPLANT
GOWN STRL REUS W/TWL LRG LVL3 (GOWN DISPOSABLE) ×9
GOWN STRL REUS W/TWL XL LVL3 (GOWN DISPOSABLE) ×6
GRAFT GORETEX 6X40 (Vascular Products) ×1 IMPLANT
KIT BASIN OR (CUSTOM PROCEDURE TRAY) ×3 IMPLANT
KIT ROOM TURNOVER OR (KITS) ×3 IMPLANT
LIQUID BAND (GAUZE/BANDAGES/DRESSINGS) ×5 IMPLANT
LOOP VESSEL MINI RED (MISCELLANEOUS) IMPLANT
NDL 18GX1X1/2 (RX/OR ONLY) (NEEDLE) ×2 IMPLANT
NDL HYPO 25GX1X1/2 BEV (NEEDLE) ×2 IMPLANT
NEEDLE 18GX1X1/2 (RX/OR ONLY) (NEEDLE) ×3 IMPLANT
NEEDLE HYPO 25GX1X1/2 BEV (NEEDLE) ×3 IMPLANT
NS IRRIG 1000ML POUR BTL (IV SOLUTION) ×3 IMPLANT
PACK CV ACCESS (CUSTOM PROCEDURE TRAY) ×3 IMPLANT
PACK SURGICAL SETUP 50X90 (CUSTOM PROCEDURE TRAY) ×2 IMPLANT
PAD ARMBOARD 7.5X6 YLW CONV (MISCELLANEOUS) ×6 IMPLANT
SET MICROPUNCTURE 5F STIFF (MISCELLANEOUS) IMPLANT
SPONGE GAUZE 2X2 STER 10/PKG (GAUZE/BANDAGES/DRESSINGS) ×1
SPONGE SURGIFOAM ABS GEL 100 (HEMOSTASIS) ×1 IMPLANT
SUT ETHILON 3 0 PS 1 (SUTURE) ×3 IMPLANT
SUT PROLENE 6 0 CC (SUTURE) ×6 IMPLANT
SUT VIC AB 3-0 SH 27 (SUTURE) ×6
SUT VIC AB 3-0 SH 27X BRD (SUTURE) ×4 IMPLANT
SUT VICRYL 4-0 PS2 18IN ABS (SUTURE) ×6 IMPLANT
SYR 20CC LL (SYRINGE) ×6 IMPLANT
SYR 5ML LL (SYRINGE) ×3 IMPLANT
SYR CONTROL 10ML LL (SYRINGE) ×3 IMPLANT
SYRINGE 10CC LL (SYRINGE) ×3 IMPLANT
TAPE CLOTH SURG 4X10 WHT LF (GAUZE/BANDAGES/DRESSINGS) ×1 IMPLANT
UNDERPAD 30X30 INCONTINENT (UNDERPADS AND DIAPERS) ×3 IMPLANT
WATER STERILE IRR 1000ML POUR (IV SOLUTION) ×3 IMPLANT
WIRE AMPLATZ SS-J .035X180CM (WIRE) IMPLANT

## 2015-02-17 NOTE — Anesthesia Postprocedure Evaluation (Signed)
  Anesthesia Post-op Note  Patient: Travis [name]  Procedure(s) Performed: Procedure(s): INSERTION OF DIALYSIS CATHETER RIGHT INTERNAL JUGULAR VEIN (N/A) INSERTION OF RIGHT ARM  ARTERIOVENOUS (AV) GORE-TEX GRAFT  (Right)  Patient Location: PACU  Anesthesia Type: General   Level of Consciousness: awake, alert  and oriented  Airway and Oxygen Therapy: Patient Spontanous Breathing  Post-op Pain: mild  Post-op Assessment: Post-op Vital signs reviewed  Post-op Vital Signs: Reviewed  Last Vitals:  Filed Vitals:   02/17/15 1638  BP: 150/69  Pulse: 88  Temp: 36.7 C  Resp:     Complications: No apparent anesthesia complications

## 2015-02-17 NOTE — H&P (View-Only) (Signed)
[facility]   Subjective  - feels ok   Objective 154/83 94 98.5 F (36.9 C) (Oral) 16 92%  Intake/Output Summary (Last 24 hours) at 02/16/15 1127 Last data filed at 02/15/15 2043  Gross per 24 hour  Intake      0 ml  Output    525 ml  Net   -525 ml   2+ radial brachial pulse bilaterally IV right arm  Assessment/Planning: Vein map reviewed.  No adequate veins for fistula.  Plan discussed with pt   Will place diatek catheter and right arm AV graft tomorrow NPO p midnight Cefuroxime on call Consent Needs all IVs out of right arm  Ruta Hinds, MD [facility] [place]: [phone] Pager: [phone]  Ruta Hinds 02/16/2015 11:27 AM --  Laboratory Lab Results:  Recent Labs  02/14/15 0604 02/15/15 0352  WBC 15.1* 14.8*  HGB 7.3* 7.3*  HCT 23.5* 23.9*  PLT 143* 143*   BMET  Recent Labs  02/15/15 0352 02/16/15 0530  NA 139 139  K 4.1 4.2  CL 92* 93*  CO2 36* 33*  GLUCOSE 96 96  BUN 49* 52*  CREATININE 6.96* 7.80*  CALCIUM 8.2* 8.3*    COAG Lab Results  Component Value Date   INR 1.23 02/11/2015   INR 1.26 02/10/2015   INR 1.44 02/09/2015   No results found for: PTT

## 2015-02-17 NOTE — Interval H&P Note (Signed)
History and Physical Interval Note:  02/17/2015 1:00 PM  Travis [name]  has presented today for surgery, with the diagnosis of ESRD  The various methods of treatment have been discussed with the patient and family. After consideration of risks, benefits and other options for treatment, the patient has consented to  Procedure(s): INSERTION OF DIALYSIS CATHETER (N/A) INSERTION OF ARTERIOVENOUS (AV) GORE-TEX GRAFT ARM (Right) as a surgical intervention .  The patient's history has been reviewed, patient examined, no change in status, stable for surgery.  I have reviewed the patient's chart and labs.  Questions were answered to the patient's satisfaction.     Ruta Hinds

## 2015-02-17 NOTE — Care Management Note (Signed)
Case Management Note  Patient Details  Name: Orry Mancini MRN: [id number] Date of Birth: [birth year]  Subjective/Objective:    Pt initially admitted for DVT.  Pt has recently undergone plasmapheresis treatment.  Creatinine continues to rise      Action/Plan:  Pt scheduled to have diatek catheter placed today for HD   Expected Discharge Date:                  Expected Discharge Plan:  [place]  In-House Referral:     Discharge planning Services  CM Consult  Post Acute Care Choice:  Resumption of Svcs/PTA Provider Choice offered to:     DME Arranged:    DME Agency:     HH Arranged:  RN, PT [place] Agency:  [place]  Status of Service:     Medicare Important Message Given:  Yes Date Medicare IM Given:  02/17/15 Medicare IM give by:  Elenor Quinones Date Additional Medicare IM Given:  02/03/15 Additional Medicare Important Message give by:  Marvetta Gibbons   If discussed at [employer], dates discussed:    Additional Comments:  Maryclare Labrador, RN 02/17/2015, 9:59 AM

## 2015-02-17 NOTE — Op Note (Signed)
Procedure: Diatek catheter, ultrasound neck, Right forearm AV graft  Preoperative diagnosis: End-stage renal disease  Postoperative diagnosis: Same  Anesthesia: General  Assistant: Gerri Lins PA-C  Operative findings: 6 mm PTFE graft.  23 cm diatek right IJ  Operative details: After obtaining informed consent, the patient was taken to the operating room. The patient was placed in supine position on the operating room table. Procedure: Ultrasound-guided insertion of Diatek catheter  Preoperative diagnosis: End-stage renal disease  Postoperative diagnosis: Same  Anesthesia: Local with IV sedation  Operative findings: 23 cm Diatek catheter right internal jugular vein  After obtaining informed consent, the patient was taken to the operating room. The patient was placed in supine position on the operating room table. After adequate sedation the patient's entire neck and chest were prepped and draped in usual sterile fashion. The patient was placed in Trendelenburg position. Ultrasound was used to identify the patient's right internal jugular vein. This had normal compressibility and respiratory variation. Local anesthesia was infiltrated over the right jugular vein.  Using ultrasound guidance, the right internal jugular vein was successfully cannulated.  A 0.035 J-tipped guidewire was threaded into the right internal jugular vein and into the superior vena cava followed by the inferior vena cava under fluoroscopic guidance.   Next sequential 12 and 14 dilators were placed over the guidewire into the right atrium.  A 16 French dilator with a peel-away sheath was then placed over the guidewire into the right atrium.   The guidewire and dilator were removed. A 23 cm Diatek catheter was then placed through the peel away sheath into the right atrium.  The catheter was then tunneled subcutaneously, cut to length, and the hub attached. The catheter was noted to flush and draw easily. The catheter was  inspected under fluoroscopy and found with its tip to be in the right atrium without any kinks throughout its course. The catheter was sutured to the skin with nylon sutures. The neck insertion site was closed with Vicryl stitch. The catheter was then loaded with concentrated Heparin solution. A dry sterile dressing was applied.  The patient tolerated procedure well and there were no complications. Instrument sponge and needle counts were correct after this portion of the case.   Next, the patient's entire left upper extremity was prepped and draped in usual sterile fashion. A longitudinal incision was made in the left antecubital area. Incision was carried down through the subcutaneous tissues down to the level of the left deep brachial vein. The vein was approximately 3 mm in diameter.  Next the brachial artery was dissected free in the medial portion incision. The brachial artery was approximately 3 mm in diameter.  Adjacent deep brachial vein was also approximately 3 mm in diameter. This was dissected free circumferentially to the selected as the outflow for the graft. Next a transverse incision was made in the distal forearm for assistance in tunneling. A subcutaneous tunnel was then created in a loop configuration down the forearm and a 6 mm PTFE graft was brought through this subcutaneous tunnel with the arterial limb on the radial aspect of the forearm. The patient was given 5000 units of intravenous heparin. After approximately 2 minutes of circulation time, Vesseloops were used to control the brachial artery proximally and distally. A longitudinal opening was made in the brachial artery the end of the graft was slightly beveled and sewn end of graft to side of artery using a running 6-0 Prolene suture. Prior to completion of the anastomosis the artery  was forward bled back bled and thoroughly flushed. The anastomosis was secured, vessel loops released, and there was pulsatile flow in the graft  immediately. Attention was then turned to the venous limb of the graft. Small bulldog clamps were used to control the outflow vein proximally and distally. A longitudinal opening was made in the deep brachial vein the graft was cut to length and spatulated and sewn end of graft to side of vein using a running 6-0 Prolene suture. Just prior to completion of anastomosis it was forward bled back bled and thoroughly flushed. The anastomosis was secured, Vesseloops were released, and  there was a palpable thrill above the graft immediately. Hemostasis was obtained with direct pressure and the assistance of 50 mg of protamine as well as thormbin and gelfoam. Next subcutaneous tissues of both incisions reapproximated using a running 3-0 Vicryl suture. The skin of both incisions was closed with a 4-0 Vicryl subcuticular stitch. Dermabond was applied to both incisions.  The patient tolerated the procedure well and there were no complications. Instrument sponge and needle counts were correct at the end of the case. The patient was taken to the recovery room in stable condition. The patient had audible radial doppler flow at the end of the case which augmented about 80% with clamping the graft.  Ruta Hinds, MD [facility] [place] Office: [phone] Pager: [phone]

## 2015-02-17 NOTE — Transfer of Care (Signed)
Immediate Anesthesia Transfer of Care Note  Patient: Travis [name]  Procedure(s) Performed: Procedure(s): INSERTION OF DIALYSIS CATHETER RIGHT INTERNAL JUGULAR VEIN (N/A) INSERTION OF RIGHT ARM  ARTERIOVENOUS (AV) GORE-TEX GRAFT  (Right)  Patient Location: PACU  Anesthesia Type:General  Level of Consciousness: sedated  Airway & Oxygen Therapy: Patient Spontanous Breathing and Patient connected to nasal cannula oxygen  Post-op Assessment: Report given to RN and Post -op Vital signs reviewed and stable  Post vital signs: Reviewed and stable  Last Vitals:  Filed Vitals:   02/17/15 0459  BP: 145/69  Pulse: 90  Temp: 36.9 C  Resp: 18   HR 89, BP 154/64, RR 12, Sats 0000000 on 2L  Complications: No apparent anesthesia complications

## 2015-02-17 NOTE — Progress Notes (Signed)
[name] HPI/Subjective: - no chest pain, shortness of breath, no abdominal pain, nausea or vomiting.   Assessment/Plan:  Acute on chronic kidney disease stage IV: - Renal consulted and patient underwent a renal biopsy on 5.25.2016 by IR. - biopsy showed thrombotic microangiopathy appearance, ADAMTS-13 assay mildly decreased, . He has empirically been started on plasmapheresis, and tapering down steroids, s/p 7 treatments total last one was on 6/3 - ANA negative, Centromere negative. Anti-scleroderma 70 antibodies negative. - HD cath placement now, will start HD  Renal hematoma: - CT scan of the abdomen and pelvis showed evidence of moderate-sized perinephric hemorrhage post percutaneous biopsy. Anticoagulation stopped and IVC filter was placed by IR. - Hemoglobin has remained stable, will transfuse if less than 7, continue to monitor.  Acute DVT of right tibial vein with intermediate probability VQ scan: - CT scan of the abdomen and pelvis was done that showed evidence of moderate left perinephric hemorrhage post percutaneous Biopsy.  - dc'd coumadin and heparin, IR was consulted for an IVC filter placement.   Elevated blood pressure without a diagnosis of hypertension: - Continue IV hydralazine as needed - he is on Nifedipine  Steroid-induced hyperglycemia: - off steroids as of 6/2 - A1c was 4.9 and continue sliding scale insulin. - CBGs well controlled  Acute on chronic anemia/acute blood loss anemia: - Continue Protonix.  Physical deconditioning: - HHPT  Sjogren's disease - Follow with rheumatology as an outpatient.   Interim History: 74 y.o. year-old with hx of DJD and GERD, otherwise healthy until April 2016 in hospital twice with pulm infiltrates (pna vs fibrosis), cough, SOB, LE edema. Work up was ANA negative but was + for RF, antiRo and antiLa and for ANCA. He has been treated with some oral prednisone. He was ordered for home O2 by his  PCP the week prior to admission. On 5/18 presented to ED with severe fatigue, LE edema. No SOB or CP. Was seen in pulm clinic and dopplers were + for DVT R leg below the knee. Admitted and started on IV heparin. Creat was up, new since last hosp stay. Patient received 3 days of high dose solumedrol the prednisone tapered down. Renal on board. Renal biopsy shows thrombotic microangiopathy that at this time appears that it might be related to hypertensive emergency versus atypical HUS. Was found to be in flank pain Hbg drop so heparin stopped, IV filter placed. CT abd pelvis showed large perinephric hemorrhage. Underwent plasmapheresis x 7 days, last one 6/3. His renal function continued to deteriorate and is being now started on HD  Consultants:  Renal  IR  Vascular surgery   Procedures:  Renal biopsy on 5.26.2016  Antibiotics:  None  Objective: Filed Vitals:   02/16/15 0538 02/16/15 1433 02/16/15 2001 02/17/15 0459  BP: 154/83 163/80 146/73 145/69  Pulse: 94 99 100 90  Temp: 98.5 F (36.9 C) 98.5 F (36.9 C) 98.9 F (37.2 C) 98.4 F (36.9 C)  TempSrc: Oral Oral Oral Oral  Resp: 16 18 18 18   Height:      Weight: 96.525 kg (212 lb 12.8 oz)   94.8 kg (208 lb 15.9 oz)  SpO2: 92% 92% 94% 95%    Intake/Output Summary (Last 24 hours) at 02/17/15 1134 Last data filed at 02/17/15 0800  Gross per 24 hour  Intake    240 ml  Output    425 ml  Net   -185 ml   Filed Weights   02/15/15 0415 02/16/15  [id number] 02/17/15 0459  Weight: 97.297 kg (214 lb 8 oz) 96.525 kg (212 lb 12.8 oz) 94.8 kg (208 lb 15.9 oz)    Exam: General: Alert, awake, oriented x3, in no acute distress, wife bedside Heart: Regular rate and rhythm, no MRG, no JVD, 1-2 + edema Lungs: Good air movement, bibasilar crackles Abdomen: Soft, nontender, nondistended, positive bowel sounds.  Neuro: Grossly intact, nonfocal. Skin: no rashes Psych: normal mood  Data Reviewed: Basic Metabolic Panel:  Recent Labs Lab  02/12/15 0500 02/13/15 0452 02/13/15 0457 02/13/15 1532 02/14/15 0604 02/15/15 0352 02/16/15 0530 02/17/15 0409  NA 137 138 138 137 140 139 139 136  K 3.7 3.9 3.9 4.2 4.0 4.1 4.2 4.3  CL 93* 93* 94* 91* 94* 92* 93* 91*  CO2 32 33* 32  --  32 36* 33* 33*  GLUCOSE 102* 101* 102* 128* 93 96 96 89  BUN 64* 59* 58* 64* 55* 49* 52* 54*  CREATININE 6.81* 6.86* 6.73* 6.50* 6.93* 6.96* 7.80* 8.44*  CALCIUM 8.3* 8.5* 8.4*  --  8.2* 8.2* 8.3* 8.3*  MG  --   --   --   --   --  1.7  --   --   PHOS 5.7* 5.6*  --   --  5.9* 5.9* 5.7*  --     Liver Function Tests:  Recent Labs Lab 02/12/15 0500 02/13/15 0452 02/14/15 0604 02/15/15 0352 02/16/15 0530  ALBUMIN 2.7* 2.9* 2.7* 2.8* 2.7*   CBC:  Recent Labs Lab 02/12/15 0500 02/13/15 0457 02/13/15 1532 02/14/15 0604 02/15/15 0352 02/17/15 0409  WBC 16.0* 15.8*  --  15.1* 14.8* 13.6*  HGB 7.2* 7.6* 8.8* 7.3* 7.3* 7.4*  HCT 23.2* 25.0* 26.0* 23.5* 23.9* 23.9*  MCV 100.9* 102.5*  --  103.1* 103.0* 100.8*  PLT 94* 128*  --  143* 143* 165   BNP (last 3 results)  Recent Labs  12/17/14 1934 01/02/15 1241 01/06/15 1758  BNP 38.6 66.6 83.3   CBG:  Recent Labs Lab 02/16/15 0630 02/16/15 1128 02/16/15 1607 02/16/15 2044 02/17/15 0621  GLUCAP 97 111* 105* 107* 103*   Studies: No results found.  Scheduled Meds: . cefUROXime (ZINACEF)  IV  1.5 g Intravenous On Call to OR  . cyanocobalamin  500 mcg Oral Daily  . darbepoetin (ARANESP) injection - DIALYSIS  60 mcg Intravenous Q Mon-HD  . docusate sodium  100 mg Oral BID  . feeding supplement (NEPRO CARB STEADY)  237 mL Oral BID BM  . ferumoxytol  510 mg Intravenous Weekly  . insulin aspart  0-9 Units Subcutaneous TID WC  . multivitamin  1 tablet Oral QHS  . neomycin-bacitracin-polymyxin   Topical BID  . NIFEdipine  30 mg Oral Daily  . pantoprazole  40 mg Oral Daily  . polyethylene glycol  17 g Oral Daily  . sodium chloride  3 mL Intravenous Q12H   Continuous  Infusions: . sodium chloride Stopped (02/01/15 1719)    Marzetta Board  [facility] Pager [phone]. If 7PM-7AM, please contact night-coverage at [email or web address], password [facility] 02/17/2015, 11:34 AM  LOS: 19 days

## 2015-02-17 NOTE — Progress Notes (Signed)
PT Cancellation Note  Patient Details Name: Travis [name] MRN: [id number] DOB: 01-Jun-1941   Cancelled Treatment:    Reason Eval/Treat Not Completed: Patient at procedure or test/unavailable   [name] 02/17/2015, 12:53 PM  Pager [phone]

## 2015-02-17 NOTE — Anesthesia Procedure Notes (Signed)
Procedure Name: LMA Insertion Date/Time: 02/17/2015 1:34 PM Performed by: Merrilyn Puma B Pre-anesthesia Checklist: Patient identified, Timeout performed, Emergency Drugs available, Suction available and Patient being monitored Patient Re-evaluated:Patient Re-evaluated prior to inductionOxygen Delivery Method: Circle system utilized Preoxygenation: Pre-oxygenation with 100% oxygen Intubation Type: IV induction LMA: LMA inserted LMA Size: 5.0 Number of attempts: 1 Placement Confirmation: positive ETCO2 and breath sounds checked- equal and bilateral Tube secured with: Tape Dental Injury: Teeth and Oropharynx as per pre-operative assessment

## 2015-02-17 NOTE — Anesthesia Preprocedure Evaluation (Addendum)
Anesthesia Evaluation  Patient identified by MRN, date of birth, ID band Patient awake    Reviewed: Allergy & Precautions, H&P , NPO status , Patient's Chart, lab work & pertinent test results  Airway Mallampati: II  TM Distance: >3 FB Neck ROM: Full    Dental  (+) Dental Advisory Given, Teeth Intact   Pulmonary shortness of breath (2L O2 at home) and Long-Term Oxygen Therapy, pneumonia - (recent HCAP end of April),  Pulmonary fibrosis breath sounds clear to auscultation        Cardiovascular Pt. on medications + Peripheral Vascular Disease and DVT Rhythm:Regular Rate:Normal     Neuro/Psych    GI/Hepatic GERD-  Medicated,  Endo/Other    Renal/GU ARFRenal disease     Musculoskeletal  (+) Arthritis -,   Abdominal   Peds  Hematology  (+) anemia , H/H 7.3/23.9   Anesthesia Other Findings   Reproductive/Obstetrics                           Anesthesia Physical Anesthesia Plan  ASA: III  Anesthesia Plan: General   Post-op Pain Management:    Induction: Intravenous  Airway Management Planned: LMA and Oral ETT  Additional Equipment:   Intra-op Plan:   Post-operative Plan: Extubation in OR  Informed Consent: I have reviewed the patients History and Physical, chart, labs and discussed the procedure including the risks, benefits and alternatives for the proposed anesthesia with the patient or authorized representative who has indicated his/her understanding and acceptance.   Dental advisory given  Plan Discussed with: Anesthesiologist and Surgeon  Anesthesia Plan Comments:         Anesthesia Quick Evaluation

## 2015-02-17 NOTE — Progress Notes (Signed)
S: Appetite poor, some nausea O:BP 145/69 mmHg  Pulse 90  Temp(Src) 98.4 F (36.9 C) (Oral)  Resp 18  Ht 5\' 10"  (1.778 m)  Wt 94.8 kg (208 lb 15.9 oz)  BMI 29.99 kg/m2  SpO2 95%  Intake/Output Summary (Last 24 hours) at 02/17/15 0711 Last data filed at 02/16/15 2002  Gross per 24 hour  Intake    240 ml  Output    425 ml  Net   -185 ml   Weight change: -1.725 kg (-3 lb 12.9 oz) Gen: Awake and alert CVS: RRR Resp: Bilat basilar crackles Abd:+ BS NTND Ext: 1-2+ edema NEURO:CNI Ox3 no asterixis   . cefUROXime (ZINACEF)  IV  1.5 g Intravenous On Call to OR  . cyanocobalamin  500 mcg Oral Daily  . darbepoetin (ARANESP) injection - DIALYSIS  60 mcg Intravenous Q Mon-HD  . docusate sodium  100 mg Oral BID  . feeding supplement (NEPRO CARB STEADY)  237 mL Oral BID BM  . ferrous sulfate  325 mg Oral BID WC  . furosemide  40 mg Intravenous BID  . insulin aspart  0-9 Units Subcutaneous TID WC  . neomycin-bacitracin-polymyxin   Topical BID  . NIFEdipine  30 mg Oral Daily  . pantoprazole  40 mg Oral Daily  . polyethylene glycol  17 g Oral Daily  . sodium chloride  3 mL Intravenous Q12H   No results found. BMET    Component Value Date/Time   NA 136 02/17/2015 0409   K 4.3 02/17/2015 0409   CL 91* 02/17/2015 0409   CO2 33* 02/17/2015 0409   GLUCOSE 89 02/17/2015 0409   BUN 54* 02/17/2015 0409   CREATININE 8.44* 02/17/2015 0409   CALCIUM 8.3* 02/17/2015 0409   GFRNONAA 6* 02/17/2015 0409   GFRAA 6* 02/17/2015 0409   CBC    Component Value Date/Time   WBC 13.6* 02/17/2015 0409   RBC 2.37* 02/17/2015 0409   RBC 4.26 12/18/2014 0038   HGB 7.4* 02/17/2015 0409   HCT 23.9* 02/17/2015 0409   PLT 165 02/17/2015 0409   MCV 100.8* 02/17/2015 0409   MCH 31.2 02/17/2015 0409   MCHC 31.0 02/17/2015 0409   RDW 18.1* 02/17/2015 0409   LYMPHSABS 1.4 01/29/2015 1438   MONOABS 1.2* 01/29/2015 1438   EOSABS 0.2 01/29/2015 1438   BASOSABS 0.0 01/29/2015 1438      Assessment:  1. 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 Anemia and iron Def 3.HTN 4. RLE DVT 5. pulm fibrosis  Plan: 1. For PC and AVG today then HD 2. IV iron.  DC PO iron and DC lasix 3. Start renavite 4. Work on [employer] T

## 2015-02-18 ENCOUNTER — Encounter (HOSPITAL_COMMUNITY): Payer: Self-pay | Admitting: Vascular Surgery

## 2015-02-18 DIAGNOSIS — R0600 Dyspnea, unspecified: Secondary | ICD-10-CM

## 2015-02-18 LAB — CBC
HEMATOCRIT: 25.1 % — AB (ref 39.0–52.0)
Hemoglobin: 7.7 g/dL — ABNORMAL LOW (ref 13.0–17.0)
MCH: 31.3 pg (ref 26.0–34.0)
MCHC: 30.7 g/dL (ref 30.0–36.0)
MCV: 102 fL — ABNORMAL HIGH (ref 78.0–100.0)
PLATELETS: 142 10*3/uL — AB (ref 150–400)
RBC: 2.46 MIL/uL — AB (ref 4.22–5.81)
RDW: 17.6 % — ABNORMAL HIGH (ref 11.5–15.5)
WBC: 13.1 10*3/uL — AB (ref 4.0–10.5)

## 2015-02-18 LAB — RENAL FUNCTION PANEL
Albumin: 2.7 g/dL — ABNORMAL LOW (ref 3.5–5.0)
Anion gap: 13 (ref 5–15)
BUN: 38 mg/dL — AB (ref 6–20)
CALCIUM: 8.1 mg/dL — AB (ref 8.9–10.3)
CO2: 29 mmol/L (ref 22–32)
Chloride: 95 mmol/L — ABNORMAL LOW (ref 101–111)
Creatinine, Ser: 6.92 mg/dL — ABNORMAL HIGH (ref 0.61–1.24)
GFR calc Af Amer: 8 mL/min — ABNORMAL LOW (ref >60)
GFR, EST NON AFRICAN AMERICAN: 7 mL/min — AB (ref >60)
GLUCOSE: 88 mg/dL (ref 65–99)
Phosphorus: 5.5 mg/dL — ABNORMAL HIGH (ref 2.5–4.6)
Potassium: 4.1 mmol/L (ref 3.5–5.1)
SODIUM: 137 mmol/L (ref 135–145)

## 2015-02-18 LAB — GLUCOSE, CAPILLARY
Glucose-Capillary: 101 mg/dL — ABNORMAL HIGH (ref 65–99)
Glucose-Capillary: 117 mg/dL — ABNORMAL HIGH (ref 65–99)
Glucose-Capillary: 97 mg/dL (ref 65–99)

## 2015-02-18 NOTE — Care Management Note (Signed)
Case Management Note  Patient Details  Name: Travis [name] MRN: [id number] Date of Birth: January 09, 1941  Subjective/Objective:    Pt initially admitted for DVT.  Pt has recently undergone plasmapheresis treatment.  Creatinine continues to rise      Action/Plan:  Pt scheduled to have diatek catheter placed today for HD   Expected Discharge Date:                  Expected Discharge Plan:  [place]  In-House Referral:     Discharge planning Services  CM Consult  Post Acute Care Choice:  Resumption of Svcs/PTA Provider Choice offered to:     DME Arranged:    DME Agency:     HH Arranged:  RN, PT [place] Agency:  [facility]  Status of Service:     Medicare Important Message Given:  Yes Date Medicare IM Given:  02/17/15 Medicare IM give by:  Elenor Quinones Date Additional Medicare IM Given:  02/03/15 Additional Medicare Important Message give by:  Marvetta Gibbons   If discussed at [employer] of Stay Meetings, dates discussed:  02/18/15  Additional Comments:  Maryclare Labrador, RN 02/18/2015, 11:37 AM

## 2015-02-18 NOTE — Progress Notes (Addendum)
  [facility] [place] Postoperative hemodialysis access   Subjective:  Denies any pain in right hand.   PHYSICAL EXAMINATION:  Filed Vitals:   02/18/15 0520  BP: 149/74  Pulse: 101  Temp: 98.5 F (36.9 C)  Resp: 21    Incisions are clean and intact.  Hand grip is 5/5  Sensation in digits is intact;  Audible bruit in right forearm AVG Right diatek site clean without erythema  ASSESSMENT/PLAN:  Travis [name] is a 74 y.o. year old male who is s/p insertion of right IJ diatek catheter and insertion of right forearm AV graft  -graft is patent, used diatek yesterday without complication.  -pt does not have evidence of steal sx -may cannulate graft in 4 weeks, f/u as needed with Dr. Oneida Alar -will sign off-call as needed.   Virgina Jock, PA-C [facility] [place] Pager: [phone]   Agree with above.  Palpable thrill. No hematoma or steal.  Will sign off  Ruta Hinds, MD [facility] [place] Office: [phone] Pager: [phone]

## 2015-02-18 NOTE — Progress Notes (Addendum)
Occupational Therapy Treatment Patient Details Name: Mcclain Ottinger MRN: [id number] DOB: Feb 08, 1941 Today's Date: 02/18/2015    History of present illness 74 y.o. male with PMHx of Arthritis, GERD, CKD, Sjogrens who was admitted 5/18 with complaint of fatigue and lower extremity edema. 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 His current hospital stay is most notable for AKI. He was also found to have DVT for which he was anticoagulated. His renal function has worsened here which lead to renal bx revealing thrombotic microangiopathy. After renal bx, he had bleeding (perinephretic hematoma) which forced discontinuation of anticoagulants and IVC filter placement. He has been treated with high dose steroids, and given biopsy results recently started on trial of plasmapheresis. 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  Had dialysis catheter placed in right arm recently.   OT comments  Pt's HR up to 130s in session. Feel pt will continue to benefit from acute OT to increase activity tolerance and incorporate energy conservation techniques.   Follow Up Recommendations  No OT follow up;Supervision - Intermittent    Equipment Recommendations  None recommended by OT    Recommendations for Other Services      Precautions / Restrictions Precautions Precautions: Fall (advised pt to limit weight on right arm) Precaution Comments: monitor O2 sats and HR Restrictions Weight Bearing Restrictions: No Other Position/Activity Restrictions: however recommended pt to avoid putting a lot of weight on right arm       Mobility  Transfers Overall transfer level: Needs assistance Transfers: Sit to/from Stand Sit to Stand: Supervision;Min assist (Min assist to help control descent to chair one  time)         General transfer comment: cues for technique and to try to limit weightbearing on right arm.    Balance   No LOB at sink during functional tasks. Supervision for ambulation with RW.                      ADL Overall ADL's : Needs assistance/impaired     Grooming: Wash/dry face;Oral care;Set up;Supervision/safety;Standing       Lower Body Bathing: Supervison/ safety;Set up;Sitting/lateral leans (washed legs)   Upper Body Dressing : Sitting;Supervision/safety Upper Body Dressing Details (indicate cue type and reason): pt took arms out of gown-cues for him to do so.     Toilet Transfer: Supervision/safety;Minimal assistance;Ambulation;RW (chair; Min assist to help control descent to chair)           Functional mobility during ADLs: Supervision/safety;Rolling walker General ADL Comments: Cues to take breaks. Explained benefit of doing activities. Pt wanted to walk so went in hallway.      Vision                     Perception     Praxis      Cognition  Awake/Alert Behavior During Therapy: Flat affect Overall Cognitive Status: Within Functional Limits for tasks assessed                       Extremity/Trunk Assessment                  Shoulder Instructions       General Comments      Pertinent Vitals/ Pain  Pain Assessment: No/denies pain; HR up to 130s in session, but trended down. O2 used in session.  Home Living                                          Prior Functioning/Environment              Frequency Min 2X/week     Progress Toward Goals  OT Goals(current goals can now be found in the care plan section)  Progress towards OT goals: Not progressing toward goals - comment (goals due to be updated;edited one goal and deleted one)  Acute Rehab OT Goals Patient Stated Goal: to walk OT Goal Formulation: With patient/family Time For Goal Achievement: 02/25/15 Potential to  Achieve Goals: Good ADL Goals Pt Will Perform Lower Body Bathing: with modified independence;sit to/from stand Pt Will Perform Lower Body Dressing: with modified independence;sit to/from stand Additional ADL Goal #2: Pt will state 3 energy conservation techniques he could use at home to attempt save energy during ADLs with no assist. Additional ADL Goal #3: Pt will independently utilize energy conservation techniques during ADLs/functional activities.  Plan Discharge plan remains appropriate    Co-evaluation                 End of Session Equipment Utilized During Treatment: Gait belt;Oxygen;Rolling walker   Activity Tolerance Patient tolerated treatment well   Patient Left in chair;with call bell/phone within reach;with nursing/sitter in room   Nurse Communication Other (comment) (check O2; HR )        Time: [id number] OT Time Calculation (min): 22 min  Charges: OT General Charges $OT Visit: 1 Procedure OT Treatments $Self Care/Home Management : 8-22 mins  Benito Mccreedy OTR/L [id number] 02/18/2015, 12:26 PM

## 2015-02-18 NOTE — Progress Notes (Signed)
Physical Therapy Treatment Patient Details Name: Travis [name] MRN: [id number] DOB: 1941/04/04 Today's Date: 02/18/2015    History of Present Illness 74 y.o. male with PMHx of Arthritis, GERD, CKD, Sjogrens who was admitted 5/18 with complaint of fatigue and lower extremity edema. 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 His current hospital stay is most notable for AKI. He was also found to have DVT for which he was anticoagulated. His renal function has worsened here which lead to renal bx revealing thrombotic microangiopathy. After renal bx, he had bleeding (perinephretic hematoma) which forced discontinuation of anticoagulants and IVC filter placement. He has been treated with high dose steroids, and given biopsy results recently started on trial of plasmapheresis. 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     PT Comments    Pt continues with elevated HR (max 140) while ambulating 243f with RW and 2LO2. Pt instructed in up/down 5 steps and demonstrated safe technique with rail. Pt has been performing exercises provided with his wife's assist. He reports he requests to walk in the hall with nursing assist and no one makes time to assist him.    Follow Up Recommendations  Supervision - Intermittent;Home health PT     Equipment Recommendations  None recommended by PT    Recommendations for Other Services       Precautions / Restrictions Precautions Precautions: Fall Precaution Comments: monitor O2 sats and HR Restrictions Weight Bearing Restrictions: No    Mobility  Bed Mobility Overal bed mobility: Needs Assistance Bed Mobility: Supine to Sit     Supine to sit: Min assist     General bed mobility comments: wife provided min assist to bring pt's torso up; good body  mechanics and seh limited pt's use of RUE  Transfers Overall transfer level: Modified independent Equipment used: Rolling walker (2 wheeled) Transfers: Sit to/from Stand Sit to Stand: Supervision         General transfer comment: no cues needed x 3 transfers  Ambulation/Gait Ambulation/Gait assistance: Supervision Ambulation Distance (Feet): 200 Feet Assistive device: Rolling walker (2 wheeled) Gait Pattern/deviations: Step-through pattern;Trunk flexed Gait velocity: better, slower pace    General Gait Details: Good use of RW to decr the work of walking; cues to self-monitor for activity tolerance--pt better re: self-regulation for decr velocity    Stairs Stairs: Yes Stairs assistance: Min guard Stair Management: One rail Right;Step to pattern;Sideways Number of Stairs: 5 General stair comments: pt familiar with sideways (he actually chooses diagonal)   Wheelchair Mobility    Modified Rankin (Stroke Patients Only)       Balance             Standing balance-Leahy Scale: Fair                      Cognition Arousal/Alertness: Awake/alert Behavior During Therapy: Flat affect Overall Cognitive Status: Within Functional Limits for tasks assessed                      Exercises Other Exercises Other Exercises: pt reports he performed alternating step "taps" onto 6" trash can (lying on it's side) 3x/day  with wife or brother in law over weekend    General Comments        Pertinent Vitals/Pain Pain Assessment: No/denies  pain (denies even Rt arm)    Home Living                      Prior Function            PT Goals (current goals can now be found in the care plan section) Acute Rehab PT Goals Patient Stated Goal: to go home PT Goal Formulation: With patient Time For Goal Achievement: 02/25/15 Potential to Achieve Goals: Good Progress towards PT goals: Progressing toward goals (2/5 goals met)    Frequency  Min 3X/week     PT Plan Current plan remains appropriate    Co-evaluation             End of Session Equipment Utilized During Treatment: Oxygen;Gait belt Activity Tolerance: Patient tolerated treatment well Patient left: with call bell/phone within reach;in chair     Time: 6438-3818 PT Time Calculation (min) (ACUTE ONLY): 27 min  Charges:  $Gait Training: 23-37 mins                    G Codes:      Travis [name] 2015-03-09, 9:23 AM Pager [phone]

## 2015-02-18 NOTE — Progress Notes (Addendum)
Daily Progress Note   Patient Name: Travis [name]       Date: 02/18/2015 DOB: Dec 28, 1940  Age: 74 y.o. MRN#: [id number] Attending Physician: Travis Griffins, MD Primary Care Physician: Travis Amel, MD Admit Date: 01/29/2015  Brief history of presenting illness: 74 y.o. male with PMHx of Arthritis, GERD, CKD, Sjogrens who was admitted 5/18 with complaint of fatigue and lower extremity edema. 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 His current hospital stay is most notable for Acute renal failure. He was also found to have DVT for which he was anticoagulated. His renal function worsened here which lead to renal bx revealing thrombotic microangiopathy. After renal bx, he had bleeding (perinephretic hematoma) which forced discontinuation of anticoagulants and IVC filter placement. He has been treated with high dose steroids, and given biopsy results was started on trial of plasmapheresis.   Palliative care was consulted for code status and goals of care discussions. The patient is now on HD, he is tolerating HD, he is participating with PT. He desires continuation of FULL code and continuation of life maintaining/ life prolonging measures.   Palliative will sign off. Please call us if we can assist in the future.     Subjective: Travis [name] is sitting up in chair, he has participated well with PT this am. He has been started on HD, he is tolerating HD well. He is eager to be D/C from the hospital so he can get started with out patient hemo dialysis. He denies any pain or dyspnea, he denies any acute symptoms.   Length of Stay: 20 days  Current Medications: Scheduled Meds:  . cyanocobalamin  500 mcg Oral Daily  . darbepoetin (ARANESP) injection - DIALYSIS  60 mcg Intravenous Q Mon-HD  . docusate sodium  100 mg Oral BID  . feeding supplement (NEPRO CARB STEADY)  237 mL Oral BID BM  .  ferumoxytol  510 mg Intravenous Weekly  . insulin aspart  0-9 Units Subcutaneous TID WC  . multivitamin  1 tablet Oral QHS  . neomycin-bacitracin-polymyxin   Topical BID  . NIFEdipine  30 mg Oral Daily  . pantoprazole  40 mg Oral Daily  . polyethylene glycol  17 g Oral Daily  . sodium chloride  3 mL Intravenous Q12H    Continuous Infusions: . sodium chloride Stopped (02/01/15 1719)  . sodium chloride 10 mL/hr at 02/17/15 1207    PRN Meds: acetaminophen **OR** acetaminophen, alum & mag hydroxide-simeth, hydrALAZINE, morphine injection, ondansetron, promethazine **OR** promethazine **OR** promethazine, sodium chloride  Palliative Performance Scale: 60%     Vital Signs: BP 149/74 mmHg  Pulse 101  Temp(Src) 98.5 F (36.9 C) (Oral)  Resp 21  Ht 5\' 10"  (1.778 m)  Wt 92.4 kg (203 lb 11.3 oz)  BMI 29.23 kg/m2  SpO2 96% SpO2: SpO2: 96 % O2 Device: O2 Device: Nasal Cannula O2 Flow Rate: O2 Flow Rate (L/min): 2 L/min  Intake/output summary:   Intake/Output Summary (Last 24 hours) at 02/18/15 1225 Last data filed at 02/18/15 0900  Gross per 24 hour  Intake   1060 ml  Output   2800 ml  Net  -1740 ml   LBM:  5/31 Baseline Weight: Weight: 97.977 kg (216 lb) Most recent weight: Weight: 92.4 kg (203 lb 11.3 oz)  Physical Exam: Gen: NAD, sitting in chair HEENT: /AT, moist mucous membranes, Lt IJ CV: RRR Pulm: No  labored breathing  Extremities: BLE 2+ edema Neuro: Awake, oriented x 3, pleasant  Additional Data Reviewed: Recent Labs     02/17/15  0409  02/18/15  0510  WBC  13.6*  13.1*  HGB  7.4*  7.7*  PLT  165  142*  NA  136  137  BUN  54*  38*  CREATININE  8.44*  6.92*     Problem List:  Patient Active Problem List   Diagnosis Date Noted  . Acute renal failure syndrome   . AKI (acute kidney injury)   . Palliative care encounter   . Acute on chronic renal failure 01/31/2015  . Acute DVT of right tibial vein 01/29/2015  . Acute kidney injury  01/29/2015  . DVT (deep venous thrombosis) 01/29/2015  . Acute on chronic respiratory failure with hypoxia 01/28/2015  . Physical deconditioning 01/28/2015  . Sjogren's disease 01/28/2015  . Pedal edema 01/28/2015  . Peripheral edema 01/07/2015  . HCAP (healthcare-associated pneumonia) 01/06/2015  . Steroid-induced hyperglycemia 01/06/2015  . Dyspnea   . Pulmonary fibrosis   . Knee pain   . Shoulder pain   . 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 Anasarca 12/17/2014  . Fever 12/17/2014  . Elevated blood pressure 12/17/2014  . Normocytic anemia 12/17/2014     Palliative Care Assessment & Plan    Code Status:  Full code - addressed briefly: patient wishes to continue, he is optimistic in his approach, he is eager to continue life maintaining life prolonging treatments such as dialysis etc.   Goals of Care: Continue life maintaining/life prolonging treatments, wishes to go home soon.   3. Symptom Management:  Denies pain, nausea, constipation.  Edema/renal failure: now on HD  4. Palliative Prophylaxis:  Stool Softener: yes  5. Prognosis: likely months to some years  5. Discharge Planning:  home when outpatient HD regimen is arranged for.    Thank you for allowing the Palliative Medicine Team to assist in the care of this patient. Palliative service will sign off, please call us if we can be of further assistance.    Time In: 1140 Time Out: 1205 Total Time 23min Prolonged Time Billed  no     Greater than 50%  of this time was spent counseling and coordinating care related to the above assessment and plan.   Loistine Chance, MD  02/18/2015, 12:25 PM  Please contact Palliative Medicine Team phone at [phone] for questions and concerns.

## 2015-02-18 NOTE — Progress Notes (Signed)
UR Completed. Kiaya Haliburton, RN, BSN.  [phone] 

## 2015-02-18 NOTE — Progress Notes (Signed)
Travis [name]: Tolerated HD well last night.  Says he feels better.  Anxious to go home O:BP 149/74 mmHg  Pulse 101  Temp(Src) 98.5 F (36.9 C) (Oral)  Resp 21  Ht 5\' 10"  (1.778 m)  Wt 92.4 kg (203 lb 11.3 oz)  BMI 29.23 kg/m2  SpO2 96%  Intake/Output Summary (Last 24 hours) at 02/18/15 [id number] Last data filed at 02/17/15 2345  Gross per 24 hour  Intake    820 ml  Output   2850 ml  Net  -2030 ml   Weight change: 0 kg (0 lb) Gen: Awake and alert CVS: RRR Resp: Bilat basilar crackles Abd:+ BS NTND Ext: 1 + edema  Rt AVG + bruit NEURO:CNI Ox3 no asterixis Rt IJ PC   . cyanocobalamin  500 mcg Oral Daily  . darbepoetin (ARANESP) injection - DIALYSIS  60 mcg Intravenous Q Mon-HD  . docusate sodium  100 mg Oral BID  . feeding supplement (NEPRO CARB STEADY)  237 mL Oral BID BM  . ferumoxytol  510 mg Intravenous Weekly  . insulin aspart  0-9 Units Subcutaneous TID WC  . multivitamin  1 tablet Oral QHS  . neomycin-bacitracin-polymyxin   Topical BID  . NIFEdipine  30 mg Oral Daily  . pantoprazole  40 mg Oral Daily  . polyethylene glycol  17 g Oral Daily  . sodium chloride  3 mL Intravenous Q12H   Dg Chest Port 1 View  02/17/2015   CLINICAL DATA:  Status post diatech placement  EXAM: PORTABLE CHEST - 1 VIEW  COMPARISON:  02/07/2015  FINDINGS: There is a new split tip catheter on the right, tips at the right atrial level. No air leak or new mediastinal widening.  Cardiopericardial enlargement and vascular pedicle widening. There is increased bilateral lung opacity with evidence of layering pleural fluid.  IMPRESSION: 1. New right IJ dialysis catheter with tips at the right atrium. No air leak. 2. Pulmonary edema and layering effusions, increased from 02/07/2012.   Electronically Signed   By: Monte Fantasia M.D.   On: 02/17/2015 16:24   Dg Fluoro Guide Cv Line-no Report  02/17/2015   CLINICAL DATA:    FLOURO GUIDE CV LINE  Fluoroscopy was utilized by the requesting physician.  No radiographic   interpretation.    BMET    Component Value Date/Time   NA 137 02/18/2015 0510   K 4.1 02/18/2015 0510   CL 95* 02/18/2015 0510   CO2 29 02/18/2015 0510   GLUCOSE 88 02/18/2015 0510   BUN 38* 02/18/2015 0510   CREATININE 6.92* 02/18/2015 0510   CALCIUM 8.1* 02/18/2015 0510   GFRNONAA 7* 02/18/2015 0510   GFRAA 8* 02/18/2015 0510   CBC    Component Value Date/Time   WBC 13.1* 02/18/2015 0510   RBC 2.46* 02/18/2015 0510   RBC 4.26 12/18/2014 0038   HGB 7.7* 02/18/2015 0510   HCT 25.1* 02/18/2015 0510   PLT 142* 02/18/2015 0510   MCV 102.0* 02/18/2015 0510   MCH 31.3 02/18/2015 0510   MCHC 30.7 02/18/2015 0510   RDW 17.6* 02/18/2015 0510   LYMPHSABS 1.4 01/29/2015 1438   MONOABS 1.2* 01/29/2015 1438   EOSABS 0.2 01/29/2015 1438   BASOSABS 0.0 01/29/2015 1438     Assessment:  1. 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 Anemia and iron Def 3.HTN 4. RLE DVT Sp IVC filter 5. pulm fibrosis  Plan: 1. HD again today 2. Show pt dx videos 3. Await  outpt spot  Travis [name] T

## 2015-02-18 NOTE — Progress Notes (Signed)
[name]  Interim History: 74 y.o. year-old with hx of DJD and GERD, otherwise healthy until April 2016 in hospital twice with pulm infiltrates (pna vs fibrosis), cough, SOB, LE edema. Work up was ANA negative but was + for RF, antiRo and antiLa and for ANCA. He has been treated with oral prednisone and has been evaluated by rheumatology as an outpatient. He was ordered for home O2 by his PCP the week prior to admission. On 5/18 presented to ED with severe fatigue, LE edema. No SOB or CP. Was seen in pulm clinic and dopplers were + for DVT R leg below the knee. Admitted and started on IV heparin. He was found to be in renal failure which was new and nephrology was consulted. 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 Following the biopsy, patient has increased back pain, underwent a CT scan which showed a large perinephric hemorrhage. His anticoagulation was discontinued and IR was consulted and patient underwent a IVC filter placement. Patient received 3 days of high dose solumedrol the prednisone tapered down and patient was started on plasmapheresis. After finishing 7 days of plasmapheresis and a prednisone taper his renal function has not recovered and nephrology started dialysis, first session on 6/6 and second on 6/7. Vascular surgery was consulted and patient underwent left AV fistula placement on 6/6.   HPI/Subjective: - no chest pain, shortness of breath, no abdominal pain, nausea or vomiting.   Assessment/Plan:  Acute on chronic kidney disease stage IV: - Renal consulted and patient underwent a renal biopsy on 5.25.2016 by IR. - biopsy showed thrombotic microangiopathy appearance, ADAMTS-13 assay mildly decreased, . He has empirically been started on plasmapheresis, and tapering down steroids, s/p 7 treatments total last one was on 6/3 - HD cath placement 6/6, HD today then per nephrology once  outpatient HD is set up can be d/c  Renal hematoma: - CT scan of the abdomen and pelvis showed evidence of moderate-sized perinephric hemorrhage post percutaneous biopsy. Anticoagulation stopped and IVC filter was placed by IR. - Hemoglobin has remained stable, will transfuse if less than 7, continue to monitor.  Acute DVT of right tibial vein with intermediate probability VQ scan: - CT scan of the abdomen and pelvis was done that showed evidence of moderate left perinephric hemorrhage post percutaneous Biopsy.  - dc'd coumadin and heparin, IR placed IVC.  Elevated blood pressure without a diagnosis of hypertension: - he is on Nifedipine  Steroid-induced hyperglycemia: - off steroids as of 6/2 - A1c was 4.9 and continue sliding scale insulin. - CBGs well controlled now  Acute on chronic anemia/acute blood loss anemia: - Continue Protonix.  Physical deconditioning: - HHPT  Sjogren's disease - Follow with rheumatology as an outpatient.   Consultants:  Renal  IR  Vascular surgery   Procedures:  Renal biopsy on 5.26.2016  Antibiotics:  None  Objective: Filed Vitals:   02/17/15 2230 02/17/15 2300 02/17/15 2344 02/18/15 0520  BP: 140/72 142/76 148/74 149/74  Pulse: 95 92 94 101  Temp:  98.4 F (36.9 C) 98.5 F (36.9 C) 98.5 F (36.9 C)  TempSrc:  Oral Oral Oral  Resp:  17 20 21   Height:      Weight:  92.4 kg (203 lb 11.3 oz)  92.4 kg (203 lb 11.3 oz)  SpO2:  95% 99% 96%    Intake/Output Summary (Last 24 hours) at 02/18/15 0654 Last data filed at 02/17/15 2345  Gross per 24 hour  Intake    820 ml  Output   2850 ml  Net  -2030 ml   Filed Weights   02/17/15 2017 02/17/15 2300 02/18/15 0520  Weight: 94.8 kg (208 lb 15.9 oz) 92.4 kg (203 lb 11.3 oz) 92.4 kg (203 lb 11.3 oz)   Exam: General: Alert, awake, oriented x3, in no acute distress, wife bedside Heart: Regular rate and rhythm, no MRG, no JVD, 1-2 + edema Lungs: Good air movement, bibasilar  crackles Abdomen: Soft, nontender, nondistended, positive bowel sounds.  Neuro: Grossly intact, nonfocal.  Data Reviewed: Basic Metabolic Panel:  Recent Labs Lab 02/13/15 0452  02/14/15 0604 02/15/15 0352 02/16/15 0530 02/17/15 0409 02/18/15 0510  NA 138  < > 140 139 139 136 137  K 3.9  < > 4.0 4.1 4.2 4.3 4.1  CL 93*  < > 94* 92* 93* 91* 95*  CO2 33*  < > 32 36* 33* 33* 29  GLUCOSE 101*  < > 93 96 96 89 88  BUN 59*  < > 55* 49* 52* 54* 38*  CREATININE 6.86*  < > 6.93* 6.96* 7.80* 8.44* 6.92*  CALCIUM 8.5*  < > 8.2* 8.2* 8.3* 8.3* 8.1*  MG  --   --   --  1.7  --   --   --   PHOS 5.6*  --  5.9* 5.9* 5.7*  --  5.5*  < > = values in this interval not displayed.  Liver Function Tests:  Recent Labs Lab 02/13/15 0452 02/14/15 0604 02/15/15 0352 02/16/15 0530 02/18/15 0510  ALBUMIN 2.9* 2.7* 2.8* 2.7* 2.7*   CBC:  Recent Labs Lab 02/13/15 0457 02/13/15 1532 02/14/15 0604 02/15/15 0352 02/17/15 0409 02/18/15 0510  WBC 15.8*  --  15.1* 14.8* 13.6* 13.1*  HGB 7.6* 8.8* 7.3* 7.3* 7.4* 7.7*  HCT 25.0* 26.0* 23.5* 23.9* 23.9* 25.1*  MCV 102.5*  --  103.1* 103.0* 100.8* 102.0*  PLT 128*  --  143* 143* 165 142*   BNP (last 3 results)  Recent Labs  12/17/14 1934 01/02/15 1241 01/06/15 1758  BNP 38.6 66.6 83.3   CBG:  Recent Labs Lab 02/17/15 0621 02/17/15 1127 02/17/15 1704 02/17/15 2348 02/18/15 0614  GLUCAP 103* 85 104* 91 101*   Studies: Dg Chest Port 1 View  02/17/2015   CLINICAL DATA:  Status post diatech placement  EXAM: PORTABLE CHEST - 1 VIEW  COMPARISON:  02/07/2015  FINDINGS: There is a new split tip catheter on the right, tips at the right atrial level. No air leak or new mediastinal widening.  Cardiopericardial enlargement and vascular pedicle widening. There is increased bilateral lung opacity with evidence of layering pleural fluid.  IMPRESSION: 1. New right IJ dialysis catheter with tips at the right atrium. No air leak. 2. Pulmonary edema and  layering effusions, increased from 02/07/2012.   Electronically Signed   By: Monte Fantasia M.D.   On: 02/17/2015 16:24   Dg Fluoro Guide Cv Line-no Report  02/17/2015   CLINICAL DATA:    FLOURO GUIDE CV LINE  Fluoroscopy was utilized by the requesting physician.  No radiographic  interpretation.     Scheduled Meds: . cyanocobalamin  500 mcg Oral Daily  . darbepoetin (ARANESP) injection - DIALYSIS  60 mcg Intravenous Q Mon-HD  . docusate sodium  100 mg Oral BID  . feeding supplement (NEPRO CARB STEADY)  237 mL Oral BID BM  . ferumoxytol  510 mg Intravenous Weekly  . insulin aspart  0-9 Units Subcutaneous TID WC  . multivitamin  1 tablet Oral QHS  . neomycin-bacitracin-polymyxin   Topical BID  . NIFEdipine  30 mg Oral Daily  . pantoprazole  40 mg Oral Daily  . polyethylene glycol  17 g Oral Daily  . sodium chloride  3 mL Intravenous Q12H   Continuous Infusions: . sodium chloride Stopped (02/01/15 1719)  . sodium chloride 10 mL/hr at 02/17/15 [place], [place]  [facility] Pager [phone]. If 7PM-7AM, please contact night-coverage at [email or web address], password [facility] 02/18/2015, 6:54 AM  LOS: 20 days

## 2015-02-18 NOTE — Progress Notes (Signed)
02/18/2015 4:43 PM Hemodialysis Outpatient Note; this patient has been accepted at the [facility] on a Tuesday, Thursday and Saturday 2nd shift schedule. The center can begin treatment on Thursday June 9 at 11:30 AM. The schedule has been explained to the patient. Thank you. Gordy Savers

## 2015-02-19 DIAGNOSIS — M3500 Sicca syndrome, unspecified: Secondary | ICD-10-CM

## 2015-02-19 DIAGNOSIS — N186 End stage renal disease: Secondary | ICD-10-CM | POA: Insufficient documentation

## 2015-02-19 DIAGNOSIS — Z992 Dependence on renal dialysis: Secondary | ICD-10-CM

## 2015-02-19 LAB — CBC
HCT: 24.8 % — ABNORMAL LOW (ref 39.0–52.0)
Hemoglobin: 7.6 g/dL — ABNORMAL LOW (ref 13.0–17.0)
MCH: 31.1 pg (ref 26.0–34.0)
MCHC: 30.6 g/dL (ref 30.0–36.0)
MCV: 101.6 fL — ABNORMAL HIGH (ref 78.0–100.0)
Platelets: 133 10*3/uL — ABNORMAL LOW (ref 150–400)
RBC: 2.44 MIL/uL — ABNORMAL LOW (ref 4.22–5.81)
RDW: 17.3 % — ABNORMAL HIGH (ref 11.5–15.5)
WBC: 12.7 10*3/uL — ABNORMAL HIGH (ref 4.0–10.5)

## 2015-02-19 LAB — HEPATITIS B CORE ANTIBODY, TOTAL: HEP B C TOTAL AB: NEGATIVE

## 2015-02-19 LAB — RENAL FUNCTION PANEL
Albumin: 2.7 g/dL — ABNORMAL LOW (ref 3.5–5.0)
Anion gap: 11 (ref 5–15)
BUN: 20 mg/dL (ref 6–20)
CO2: 27 mmol/L (ref 22–32)
Calcium: 8.2 mg/dL — ABNORMAL LOW (ref 8.9–10.3)
Chloride: 98 mmol/L — ABNORMAL LOW (ref 101–111)
Creatinine, Ser: 4.97 mg/dL — ABNORMAL HIGH (ref 0.61–1.24)
GFR calc Af Amer: 12 mL/min — ABNORMAL LOW (ref >60)
GFR calc non Af Amer: 10 mL/min — ABNORMAL LOW (ref >60)
Glucose, Bld: 95 mg/dL (ref 65–99)
POTASSIUM: 3.9 mmol/L (ref 3.5–5.1)
Phosphorus: 3.8 mg/dL (ref 2.5–4.6)
Sodium: 136 mmol/L (ref 135–145)

## 2015-02-19 LAB — HEPATITIS B SURFACE ANTIBODY,QUALITATIVE: Hep B S Ab: REACTIVE

## 2015-02-19 LAB — GLUCOSE, CAPILLARY
Glucose-Capillary: 98 mg/dL (ref 65–99)
Glucose-Capillary: 106 mg/dL — ABNORMAL HIGH (ref 65–99)

## 2015-02-19 LAB — HEPATITIS B SURFACE ANTIGEN: Hepatitis B Surface Ag: NEGATIVE

## 2015-02-19 LAB — MAGNESIUM: Magnesium: 1.8 mg/dL (ref 1.7–2.4)

## 2015-02-19 MED ORDER — HYDROXYZINE HCL 10 MG PO TABS: 10.0000 mg | ORAL_TABLET | Freq: Three times a day (TID) | ORAL | Status: DC | PRN

## 2015-02-19 MED ORDER — NIFEDIPINE ER 30 MG PO TB24: 30.0000 mg | ORAL_TABLET | Freq: Every day | ORAL | Status: DC

## 2015-02-19 NOTE — Care Management Note (Signed)
Case Management Note  Patient Details  Name: Razeen Laudadio MRN: [id number] Date of Birth: Feb 15, 1941  Subjective/Objective:    Pt initially admitted for DVT.  Pt has recently undergone plasmapheresis treatment.  Creatinine continues to rise      Action/Plan:  Pt scheduled to have diatek catheter placed today for HD   Expected Discharge Date:                  Expected Discharge Plan:  [place]  In-House Referral:     Discharge planning Services  CM Consult  Post Acute Care Choice:  Resumption of Svcs/PTA Provider Choice offered to:     DME Arranged:    DME Agency:     HH Arranged:  RN, PT [place] Agency:  [place]  Status of Service:   Complete, will sign off  Medicare Important Message Given:  Yes Date Medicare IM Given:  02/17/15 Medicare IM give by:  Elenor Quinones Date Additional Medicare IM Given:  02/18/15 Additional Medicare Important Message give by:  Elenor Quinones  Disposition Plan: Home with [place]  If discussed at [place] of Stay Meetings, dates discussed:  02/18/15  Additional Comments: 02/18/15 Elenor Quinones, RN, BSN [phone] CM spoke with both pt and wife regarding no longer adhering to previous discharge plan from 01/31/15; pt was to be discharged home on Lovenox, wife picked up prescription anticipating weekend discharge however pt declined clinically and was unable to discharge.  CM instructed pt and wife to not administer Lovenox as previously instructed, CM communicated that bedside nurse would go over discharge instructions that would specify what medications should be taken at home.  Wife and pt stated they understood that they should only take the stated medication/dose/frequency as specified on discharge instructions.  CM contacted bedside nurse and informed of concern, CM asked bedside nurse to reiterate teaching regarding which medication should be taken at home.  [facility] contacted and  informed of pending discharge today.    Maryclare Labrador, RN 02/19/2015, 10:52 AM

## 2015-02-19 NOTE — Discharge Summary (Signed)
Physician Discharge Summary  Travis [name] DOB: 20-Apr-1971 DOA: 01/29/2015  PCP: [name], MD  Admit date: 01/29/2015 Discharge date: 02/19/2015  Time spent: 35 minutes  Recommendations for Outpatient Follow-up:  1. Patient set up with outpatient HD at [facility] and Sat 2. Dry Weight 89 Kg 3. Please follow up on CBC on hospital follow  4. Home Health Services were set up prior to discharge  Discharge Diagnoses:  Principal Problem:   Acute DVT of right tibial vein Active Problems:   Elevated blood pressure   Steroid-induced hyperglycemia   Peripheral edema   Physical deconditioning   Sjogren's disease   Acute kidney injury   DVT (deep venous thrombosis)   Acute on chronic renal failure   Palliative care encounter   AKI (acute kidney injury)   Acute renal failure syndrome   ESRD on dialysis   Discharge Condition: Stable  Diet recommendation: Renal Diet  Filed Weights   02/18/15 1510 02/18/15 1821 02/19/15 0420  Weight: 93 kg (205 lb 0.4 oz) 91.1 kg (200 lb 13.4 oz) 90.8 kg (200 lb 2.8 oz)    History of present illness:  Travis [name] is a 74 y.o. gentleman with PMHx of arthritis and acid reflux until last month when he was hospitalized for CAP (4/5-4/14, completed 7 days of IV Rocephin and azithromycin) with repeat admission for HCAP (4/25-4/28, treated with IV vancomycin and cefepime at that time, was not discharged on oral antibiotics). Chest CT findings were suggestive of pleural effusions and pulmonary fibrosis, which led to a rheumatological work-up. The patient was found to be ANA neg, but SSA and SSB pos, Rf pos. He was discharged on prednisone and has rheumatology follow-up next week. Since his last discharge, he has had persistent weakness, debility, and he has developed significant lower extremity edema. He complains of having "no energy". He explicitly denies chest pain, SOB, or light-headedness. He has not had syncope. Home O2 was  ordered by his PCP last week; he reports "I was told that I qualified for it". He has been wearing 2L [place], continuous. He saw Dr. Chase Caller today, who ordered lower extremity dopplers for his leg edema. Imaging positive for acute DVT to right peroneal, mid and distal posterior tibial veins. The patient was referred to the ED for initiation of heparin drip and admission.  ED evaluation also concerning for AKI. Patient presents with elevated BUN and creatinine, new since last discharge. He was just given a prescription for lasix for his lower extremity swelling, but he has not taken any doses at home. No lower urinary tract symptoms.  Of note, V/Q scan in ED is intermediate probability. Renal function prohibits CTA chest at this time. No evidence of CHF on echo in April.  Hospital Course:  74 y.o. year-old with hx of DJD and GERD, otherwise healthy until April 2016 in hospital twice with pulm infiltrates (pna vs fibrosis), cough, SOB, LE edema. Work up was ANA negative but was + for RF, antiRo and antiLa and for ANCA. He has been treated with oral prednisone and has been evaluated by rheumatology as an outpatient. He was ordered for home O2 by his PCP the week prior to admission. On 5/18 presented to ED with severe fatigue, LE edema. No SOB or CP. Was seen in pulm clinic and dopplers were + for DVT R leg below the knee. Admitted and started on IV heparin. He was found to be in renal failure which was new and nephrology was consulted. 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 Following the biopsy, patient has increased back pain, underwent a CT scan which showed a large perinephric hemorrhage. His anticoagulation was discontinued and IR was consulted and patient underwent a IVC filter placement. Patient received 3 days of high dose solumedrol the prednisone tapered down and patient was started on plasmapheresis. After finishing 7 days of  plasmapheresis and a prednisone taper his renal function has not recovered and nephrology started dialysis, first session on 6/6 and second on 6/7. Vascular surgery was consulted and patient underwent left AV fistula placement on 6/6.   Acute on chronic kidney disease stage IV: - Renal consulted and patient underwent a renal biopsy on 5.25.2016 by IR. -Pathology reporting thrombotic microangiopathy appearance, ADAMTS-13 assay mildly decreased, . He has empirically been started on plasmapheresis, and tapering down steroids, s/p 7 treatments total last one was on 6/3 - HD cath placement 6/6 -He was set up with outpatient hemodialysis at [facility]  Renal hematoma: - CT scan of the abdomen and pelvis showed evidence of moderate-sized perinephric hemorrhage post percutaneous biopsy. Anticoagulation stopped and IVC filter was placed by IR. - Hemoglobin has remained stable -Please follow-up on CBC on hospital follow-up visit  Acute DVT of right tibial vein with intermediate probability VQ scan: - CT scan of the abdomen and pelvis was done that showed evidence of moderate left perinephric hemorrhage post percutaneous Biopsy.  -IR placed IVC filter  Elevated blood pressure without a diagnosis of hypertension: -We'll discharge patient on nifedipine pain  Acute on chronic anemia/acute blood loss anemia: - Continue Protonix.  Physical deconditioning: -Prior to discharge patient was set up with home health services  Sjogren's disease - Follow with rheumatology as an outpatient.  Procedures:  Renal biopsy on 5.26.2016  Consultations:  Renal  IR  Vascular surgery  Discharge Exam: Filed Vitals:   02/19/15 0420  BP: 135/69  Pulse: 95  Temp: 99.1 F (37.3 C)  Resp: 18    General: Patient is in no acute distress, he ambulated down the hallway to the nurses station and back, feels ready to go home today. Cardiovascular: 2/6 systolic ejection murmur, regular rhythm, normal  S1-S2 Respiratory: Normal respiratory effort, lungs are clear to auscultation bilaterally Abdomen: Soft nontender nondistended positive bowel sounds  Discharge Instructions   Discharge Instructions    Call MD for:  difficulty breathing, headache or visual disturbances    Complete by:  As directed      Call MD for:  extreme fatigue    Complete by:  As directed      Call MD for:  hives    Complete by:  As directed      Call MD for:  persistant dizziness or light-headedness    Complete by:  As directed      Call MD for:  persistant nausea and vomiting    Complete by:  As directed      Call MD for:  redness, tenderness, or signs of infection (pain, swelling, redness, odor or green/yellow discharge around incision site)    Complete by:  As directed      Call MD for:  severe uncontrolled pain    Complete by:  As directed      Call MD for:  temperature >100.4    Complete by:  As directed      Diet - low sodium heart healthy    Complete by:  As directed  Increase activity slowly    Complete by:  As directed           Current Discharge Medication List    START taking these medications   Details  hydrOXYzine (ATARAX/VISTARIL) 10 MG tablet Take 1 tablet (10 mg total) by mouth 3 (three) times daily as needed. Qty: 30 tablet, Refills: 0    NIFEdipine (PROCARDIA-XL/ADALAT CC) 30 MG 24 hr tablet Take 1 tablet (30 mg total) by mouth daily. Qty: 30 tablet, Refills: 1    pantoprazole (PROTONIX) 40 MG tablet Take 1 tablet (40 mg total) by mouth daily. Qty: 30 tablet, Refills: 1      CONTINUE these medications which have NOT CHANGED   Details  cyanocobalamin 500 MCG tablet Take 1 tablet (500 mcg total) by mouth daily. Qty: 30 tablet, Refills: 0    feeding supplement, ENSURE ENLIVE, (ENSURE ENLIVE) LIQD Take 237 mLs by mouth 2 (two) times daily between meals. Qty: 237 mL, Refills: 12    ferrous sulfate 325 (65 FE) MG tablet Take 1 tablet (325 mg total) by mouth 2 (two) times daily  with a meal. Qty: 60 tablet, Refills: 0    furosemide (LASIX) 40 MG tablet Take 1 tablet (40 mg total) by mouth daily. Qty: 30 tablet, Refills: 0    loratadine (CLARITIN) 10 MG tablet Take 10 mg by mouth daily.    nystatin (MYCOSTATIN) 100000 UNIT/ML suspension Take 5 mLs (500,000 Units total) by mouth 4 (four) times daily. For 7 days Qty: 60 mL, Refills: 0    traMADol-acetaminophen (ULTRACET) 37.5-325 MG per tablet Take 1-2 tablets by mouth every 4 (four) hours as needed for severe pain. Qty: 30 tablet, Refills: 0      STOP taking these medications     potassium chloride SA (KLOR-CON M20) 20 MEQ tablet      predniSONE (DELTASONE) 10 MG tablet        No Known Allergies Follow-up Information    Follow up with [facility].   Why:  Registered Nurse, Physical therapist   Contact information:   [address] [place] [address] [phone]       Follow up with MATTINGLY,MICHAEL T, MD In 1 week.   Specialty:  Nephrology   Contact information:   [place] [address] [phone]        The results of significant diagnostics from this hospitalization (including imaging, microbiology, ancillary and laboratory) are listed below for reference.    Significant Diagnostic Studies: Ct Abdomen Pelvis Wo Contrast  02/07/2015   CLINICAL DATA:  Left-sided abdominal pain. Left renal biopsy yesterday. Increased pain today with low hemoglobin.  EXAM: CT ABDOMEN AND PELVIS WITHOUT CONTRAST  TECHNIQUE: Multidetector CT imaging of the abdomen and pelvis was performed following the standard protocol without IV contrast.  COMPARISON:  Chest CT 12/19/2014 and chest CT 01/02/2015  FINDINGS: Lung bases demonstrate small to moderate bilateral pleural effusions which are worse with associated bibasilar airspace opacification without significant change which may be due to pneumonia versus atelectasis. Small amount of pericardial fluid unchanged.  Abdominal  images demonstrate the left kidney to be normal in size without hydronephrosis, nephrolithiasis or focal mass. There is moderate mixed attenuation left perinephric hemorrhage from patient's recent percutaneous biopsy. Hemorrhage is worse over the posterior perinephric space which extends inferiorly below the kidney abutting the psoas muscle extending to the level of the left pelvis just above the inguinal region. This hemorrhage displaces the kidney slightly anteriorly.  The  liver, spleen, pancreas, adrenal glands and right kidney are within normal. There is mild layering material within the gallbladder which may represent sludge versus non calcified stones. Appendix is normal. There is diverticulosis of the colon.  Pelvic images demonstrate the bladder, prostate and rectum to be within normal. There is fluid in the presacral space. There is spondylosis of the lumbar spine with disc disease at the L4-5 level.  IMPRESSION: Evidence of moderate left perinephric hemorrhage post percutaneous biopsy yesterday. Hemorrhage is worse over the posterior perinephric space with mild displacement of the kidney anteriorly. Hemorrhage extends inferiorly to the level of the left pelvis just above the inguinal region. No hydronephrosis.  Worsening moderate size bilateral pleural effusions with associated airspace disease in the lower lungs likely pneumonia and less likely atelectasis.  Small amount of pericardial fluid unchanged.  Sludge versus noncalcified gallstones.  Mild diverticulosis of the colon.   Electronically Signed   By: Marin Olp M.D.   On: 02/07/2015 12:12   Dg Chest 2 View  01/29/2015   CLINICAL DATA:  Pneumonia, shortness of breath.  EXAM: CHEST  2 VIEW  COMPARISON:  Jan 28, 2015.  FINDINGS: Stable cardiomediastinal silhouette. No pneumothorax is noted. Stable bibasilar opacities are noted consistent with pneumonia with probable minimal associated pleural effusions. Bony thorax appears intact.  IMPRESSION:  Stable bibasilar opacities are noted most consistent with pneumonia. Continued radiographic follow-up is recommended to ensure resolution.   Electronically Signed   By: Marijo Conception, M.D.   On: 01/29/2015 17:51   Dg Chest 2 View  01/28/2015   CLINICAL DATA:  Pneumonia, followup  EXAM: CHEST  2 VIEW  COMPARISON:  Chest x-ray of 01/06/2015  FINDINGS: There has been worsening of parenchymal opacities in both lower lobes consistent with worsening of bilateral lower lobe pneumonia. A small right pleural effusion cannot be excluded. Mediastinal and hilar contours are unchanged. The heart is mildly enlarged and stable. No acute bony abnormality is seen.  IMPRESSION: Worsening of lower lobe opacities consistent with pneumonia.   Electronically Signed   By: Ivar Drape M.D.   On: 01/28/2015 14:47   Ir Ivc Filter Plmt / S&i /img Guid/mod Sed  02/07/2015   INDICATION: History of right lower extremity DVT and development of a left perinephric hematoma following a left kidney biopsy. Patient is not a candidate for anticoagulation at this time and needs pulmonary embolism prophylaxis.  EXAM: IVC FILTER PLACEMENT; IVC VENOGRAM; ULTRASOUND FOR VASCULAR ACCESS  Physician: Stephan Minister. Henn, MD  FLUOROSCOPY TIME:  4 minutes and 18 seconds, 140.6 mGy  MEDICATIONS: 1 mg versed, 25 mcg fentanyl. A radiology nurse monitored the patient for moderate sedation.  ANESTHESIA/SEDATION: Moderate sedation time: 20 minutes  CONTRAST:  Carbon dioxide  PROCEDURE: The procedure was explained to the patient. The risks and benefits of the procedure were discussed and the patient's questions were addressed. Informed consent was obtained from the patient. Ultrasound demonstrated a patent right internal jugular vein. Ultrasound images were obtained for documentation. The right side of the neck was prepped and draped in a sterile fashion. Maximal barrier sterile technique was utilized including caps, mask, sterile gowns, sterile gloves, sterile drape,  hand hygiene and skin antiseptic. The skin was anesthetized with 1% lidocaine. A 21 gauge needle was directed into the vein with ultrasound guidance and a micropuncture dilator set was placed. A wire was advanced into the IVC. The filter sheath was advanced over the wire into the IVC. An IVC venogram was performed with  carbon dioxide. Fluoroscopic images were obtained for documentation. Bilateral renal veins were cannulated with a Bentson wire using a vert catheter. A Bard Denali filter was deployed below the lowest renal vein. Vascular sheath was removed with manual compression. Bandage placed over the puncture site.  FINDINGS: IVC was patent. Bilateral renal veins were identified. The filter was deployed below the lowest renal vein.  COMPLICATIONS: None  IMPRESSION: Successful placement of a retrievable IVC filter.  This IVC filter is potentially retrievable. The patient will be assessed for filter retrieval by Interventional Radiology in approximately 8-12 weeks. Further recommendations regarding filter retrieval, continued surveillance or declaration of device permanence, will be made at that time.   Electronically Signed   By: Markus Daft M.D.   On: 02/07/2015 17:58   US Renal  01/30/2015   CLINICAL DATA:  Acute renal failure.  Sjogren's disease.  EXAM: RENAL / URINARY TRACT ULTRASOUND COMPLETE  COMPARISON:  None.  FINDINGS: Right Kidney:  Length: 12.5 cm. Echogenicity within normal limits. No mass or hydronephrosis visualized.  Left Kidney:  Length: 13.7 cm. Echogenicity within normal limits. No mass or hydronephrosis visualized.  Bladder:  Bladder is nondistended. Mild bladder wall prominence is most likely secondary to nondistended state.  IMPRESSION: Normal exam.   Electronically Signed   By: Marcello Moores  Register   On: 01/30/2015 07:40   Nm Pulmonary Perf And Vent  01/29/2015   CLINICAL DATA:  Shortness of breath for 2 weeks. Known deep venous thrombosis .  EXAM: NUCLEAR MEDICINE VENTILATION - PERFUSION  LUNG SCAN  TECHNIQUE: Ventilation images were obtained in multiple projections using inhaled aerosol Tc-14m DTPA. Perfusion images were obtained in multiple projections after intravenous injection of Tc-43m MAA.  RADIOPHARMACEUTICALS:  40.0 Technetium-25m DTPA aerosol inhalation and 6.0 Technetium-70m MAA IV  COMPARISON:  Chest radiograph same date, which demonstrate left greater than right bibasilar airspace disease.  FINDINGS: Ventilation: Vague decreased ventilation involving the left greater than right lung bases, corresponding to airspace disease on chest radiograph.  Perfusion: Vague decreased perfusion within the same areas of the lung bases, greater on the left.  IMPRESSION: Triple matched defects within the lung bases, greater on the left than right. Intermediate probability for pulmonary embolism.   Electronically Signed   By: Abigail Miyamoto M.D.   On: 01/29/2015 17:37   US Biopsy  02/05/2015   CLINICAL DATA:  Acute renal failure and glomerulonephritis. The patient requires renal biopsy.  EXAM: ULTRASOUND GUIDED CORE BIOPSY OF LEFT KIDNEY  MEDICATIONS: 1.0 mg IV Versed; 50 mcg IV Fentanyl  Total Moderate Sedation Time: 15 minutes  PROCEDURE: The procedure, risks, benefits, and alternatives were explained to the patient. Questions regarding the procedure were encouraged and answered. The patient understands and consents to the procedure. A time-out was performed prior to the procedure.  The left flank region was prepped with Betadine in a sterile fashion, and a sterile drape was applied covering the operative field. A sterile gown and sterile gloves were used for the procedure. Local anesthesia was provided with 1% Lidocaine.  Ultrasound was performed of both kidneys. The left kidney was chosen for biopsy. Under ultrasound guidance, 2 separate core biopsy samples were obtained through lower pole cortex utilizing a 16 gauge needle biopsy device. Postprocedural ultrasound was performed.  COMPLICATIONS: Small  amount of perinephric hemorrhage. SIR level A: No therapy, no consequence.  FINDINGS: Both kidneys were well visualized by ultrasound. Percutaneous window to renal cortex was slightly better on the left compared to the right. Two solid  core biopsy samples were obtained. Post biopsy imaging shows a small amount of perinephric hemorrhage adjacent to the lower pole of the left kidney. This did not visibly enlarge over several minutes of sonographic surveillance.  IMPRESSION: Ultrasound-guided core biopsy performed of the lower pole of the left kidney. The procedure was complicated by a small amount of focal perinephric hemorrhage adjacent to the lower pole.   Electronically Signed   By: Aletta Edouard M.D.   On: 02/05/2015 16:15   Dg Chest Port 1 View  02/17/2015   CLINICAL DATA:  Status post diatech placement  EXAM: PORTABLE CHEST - 1 VIEW  COMPARISON:  02/07/2015  FINDINGS: There is a new split tip catheter on the right, tips at the right atrial level. No air leak or new mediastinal widening.  Cardiopericardial enlargement and vascular pedicle widening. There is increased bilateral lung opacity with evidence of layering pleural fluid.  IMPRESSION: 1. New right IJ dialysis catheter with tips at the right atrium. No air leak. 2. Pulmonary edema and layering effusions, increased from 02/07/2012.   Electronically Signed   By: Monte Fantasia M.D.   On: 02/17/2015 16:24   Dg Chest Port 1 View  02/07/2015   CLINICAL DATA:  Central line placement.  Initial encounter.  EXAM: PORTABLE CHEST - 1 VIEW  COMPARISON:  Chest radiograph performed 01/29/2015  FINDINGS: The patient's left-sided dual-lumen catheter is noted ending about the mid SVC.  Bibasilar airspace opacities are stable from prior studies and may reflect persistent pneumonia. Small bilateral pleural effusions are suspected. No pneumothorax is seen.  The cardiomediastinal silhouette is borderline enlarged. No acute osseous abnormalities are identified.   IMPRESSION: 1. Left-sided dual-lumen catheter noted ending about the mid SVC. 2. Persistent bibasilar airspace opacities may reflect persistent pneumonia, given clinical concern. Suspect small bilateral pleural effusions. 3. Borderline cardiomegaly.   Electronically Signed   By: Garald Balding M.D.   On: 02/07/2015 23:57   Dg Fluoro Guide Cv Line-no Report  02/17/2015   CLINICAL DATA:    FLOURO GUIDE CV LINE  Fluoroscopy was utilized by the requesting physician.  No radiographic  interpretation.     Microbiology: No results found for this or any previous visit (from the past 240 hour(s)).   Labs: Basic Metabolic Panel:  Recent Labs Lab 02/14/15 0604 02/15/15 0352 02/16/15 0530 02/17/15 0409 02/18/15 0510 02/19/15 0451  NA 140 139 139 136 137 136  K 4.0 4.1 4.2 4.3 4.1 3.9  CL 94* 92* 93* 91* 95* 98*  CO2 32 36* 33* 33* 29 27  GLUCOSE 93 96 96 89 88 95  BUN 55* 49* 52* 54* 38* 20  CREATININE 6.93* 6.96* 7.80* 8.44* 6.92* 4.97*  CALCIUM 8.2* 8.2* 8.3* 8.3* 8.1* 8.2*  MG  --  1.7  --   --   --  1.8  PHOS 5.9* 5.9* 5.7*  --  5.5* 3.8   Liver Function Tests:  Recent Labs Lab 02/14/15 0604 02/15/15 0352 02/16/15 0530 02/18/15 0510 02/19/15 0451  ALBUMIN 2.7* 2.8* 2.7* 2.7* 2.7*   No results for input(s): LIPASE, AMYLASE in the last 168 hours. No results for input(s): AMMONIA in the last 168 hours. CBC:  Recent Labs Lab 02/14/15 0604 02/15/15 0352 02/17/15 0409 02/18/15 0510 02/19/15 0451  WBC 15.1* 14.8* 13.6* 13.1* 12.7*  HGB 7.3* 7.3* 7.4* 7.7* 7.6*  HCT 23.5* 23.9* 23.9* 25.1* 24.8*  MCV 103.1* 103.0* 100.8* 102.0* 101.6*  PLT 143* 143* 165 142* 133*   Cardiac Enzymes: No  results for input(s): CKTOTAL, CKMB, CKMBINDEX, TROPONINI in the last 168 hours. BNP: BNP (last 3 results)  Recent Labs  12/17/14 1934 01/02/15 1241 01/06/15 1758  BNP 38.6 66.6 83.3    ProBNP (last 3 results) No results for input(s): PROBNP in the last 8760  hours.  CBG:  Recent Labs Lab 02/18/15 0614 02/18/15 1131 02/18/15 2006 02/19/15 0616 02/19/15 1129  GLUCAP 101* 117* 97 98 106*       Signed:  Janalynn Eder  [facility] 02/19/2015, 1:43 PM

## 2015-02-19 NOTE — Progress Notes (Addendum)
S: Feels well, wants to go home O:BP 135/69 mmHg  Pulse 95  Temp(Src) 99.1 F (37.3 C) (Oral)  Resp 18  Ht 5\' 10"  (1.778 m)  Wt 90.8 kg (200 lb 2.8 oz)  BMI 28.72 kg/m2  SpO2 97%  Intake/Output Summary (Last 24 hours) at 02/19/15 0651 Last data filed at 02/18/15 2000  Gross per 24 hour  Intake    400 ml  Output   2250 ml  Net  -1850 ml   Weight change: -1.8 kg (-3 lb 15.5 oz) Gen: Awake and alert CVS: RRR Resp: Bilat basilar crackles Abd:+ BS NTND Ext: 1 + edema  Rt AVG + bruit NEURO:CNI Ox3 no asterixis Rt IJ PC   . cyanocobalamin  500 mcg Oral Daily  . darbepoetin (ARANESP) injection - DIALYSIS  60 mcg Intravenous Q Mon-HD  . docusate sodium  100 mg Oral BID  . feeding supplement (NEPRO CARB STEADY)  237 mL Oral BID BM  . ferumoxytol  510 mg Intravenous Weekly  . insulin aspart  0-9 Units Subcutaneous TID WC  . multivitamin  1 tablet Oral QHS  . neomycin-bacitracin-polymyxin   Topical BID  . NIFEdipine  30 mg Oral Daily  . pantoprazole  40 mg Oral Daily  . polyethylene glycol  17 g Oral Daily  . sodium chloride  3 mL Intravenous Q12H   Dg Chest Port 1 View  02/17/2015   CLINICAL DATA:  Status post diatech placement  EXAM: PORTABLE CHEST - 1 VIEW  COMPARISON:  02/07/2015  FINDINGS: There is a new split tip catheter on the right, tips at the right atrial level. No air leak or new mediastinal widening.  Cardiopericardial enlargement and vascular pedicle widening. There is increased bilateral lung opacity with evidence of layering pleural fluid.  IMPRESSION: 1. New right IJ dialysis catheter with tips at the right atrium. No air leak. 2. Pulmonary edema and layering effusions, increased from 02/07/2012.   Electronically Signed   By: Monte Fantasia M.D.   On: 02/17/2015 16:24   Dg Fluoro Guide Cv Line-no Report  02/17/2015   CLINICAL DATA:    FLOURO GUIDE CV LINE  Fluoroscopy was utilized by the requesting physician.  No radiographic  interpretation.    BMET    Component  Value Date/Time   NA 136 02/19/2015 0451   K 3.9 02/19/2015 0451   CL 98* 02/19/2015 0451   CO2 27 02/19/2015 0451   GLUCOSE 95 02/19/2015 0451   BUN 20 02/19/2015 0451   CREATININE 4.97* 02/19/2015 0451   CALCIUM 8.2* 02/19/2015 0451   GFRNONAA 10* 02/19/2015 0451   GFRAA 12* 02/19/2015 0451   CBC    Component Value Date/Time   WBC 12.7* 02/19/2015 0451   RBC 2.44* 02/19/2015 0451   RBC 4.26 12/18/2014 0038   HGB 7.6* 02/19/2015 0451   HCT 24.8* 02/19/2015 0451   PLT 133* 02/19/2015 0451   MCV 101.6* 02/19/2015 0451   MCH 31.1 02/19/2015 0451   MCHC 30.6 02/19/2015 0451   RDW 17.3* 02/19/2015 0451   LYMPHSABS 1.4 01/29/2015 1438   MONOABS 1.2* 01/29/2015 1438   EOSABS 0.2 01/29/2015 1438   BASOSABS 0.0 01/29/2015 1438     Assessment:  1. 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 Anemia and iron Def 3.HTN 4. RLE DVT Sp IVC filter 5. pulm fibrosis  Plan: 1. He is set up for HD at [facility] .  He is aware to arrive there on thurs at 11:30am.  Will put his DW at 89kg and adjust from there.  He can be DC'd from renal standpoint. Aubrii Sharpless T

## 2015-02-20 ENCOUNTER — Encounter (HOSPITAL_COMMUNITY): Payer: Self-pay

## 2015-02-20 DIAGNOSIS — D631 Anemia in chronic kidney disease: Secondary | ICD-10-CM | POA: Insufficient documentation

## 2015-02-20 DIAGNOSIS — N2581 Secondary hyperparathyroidism of renal origin: Secondary | ICD-10-CM | POA: Insufficient documentation

## 2015-02-20 DIAGNOSIS — L299 Pruritus, unspecified: Secondary | ICD-10-CM | POA: Insufficient documentation

## 2015-02-20 DIAGNOSIS — D509 Iron deficiency anemia, unspecified: Secondary | ICD-10-CM | POA: Insufficient documentation

## 2015-02-20 DIAGNOSIS — D688 Other specified coagulation defects: Secondary | ICD-10-CM | POA: Insufficient documentation

## 2015-02-21 DIAGNOSIS — R7309 Other abnormal glucose: Secondary | ICD-10-CM | POA: Insufficient documentation

## 2015-02-26 ENCOUNTER — Encounter (HOSPITAL_COMMUNITY): Payer: Self-pay

## 2015-03-27 ENCOUNTER — Telehealth: Payer: Self-pay | Admitting: Internal Medicine

## 2015-03-27 NOTE — Telephone Encounter (Signed)
ATC line rang several times wcb

## 2015-03-28 NOTE — Telephone Encounter (Signed)
Attempted to call [name]. No answer, no option to leave a message. Will need to try back.

## 2015-03-31 NOTE — Telephone Encounter (Signed)
lmtcb X3 for pt and wife.  Will close message per triage protocol.

## 2015-04-01 ENCOUNTER — Telehealth: Payer: Self-pay | Admitting: Internal Medicine

## 2015-04-01 NOTE — Telephone Encounter (Signed)
Spoke with pt's wife, Burman Nieves. [name] stated she was informed the pt needs to have a catheter removed because it's been in place for 2 months. Pt was admitted in May 2016 for DVT and then had an IVC filter placed by IR. [name] aware MR will not be back in office until next week.   MR please advise if this needs to be removed.

## 2015-04-06 NOTE — Telephone Encounter (Signed)
I saw this patient once in may 2016 as post hospital fu for pneumopnia and resp failure and as consult. During this time we dx incidental DVT and he was admitted. Beyond that 1 encounter the hospitalists, and renal docs have been managing him. Chart review shows that he was on IV heparin for DVT and had renal bx for ESRD (new due to  High bp)  and in aftermath of had renal hematoma and ended up with IVC Filter. For his ESRD they also placed HD cath  So, is wife wanting to remove the IVC filter or HD cath ?  Regardless the decision for this depends on renal service because the bleeding was around the kidney and decision to restart anti-coagulation to be made by renal service and PCP [name], MD   We do not Rx DVT here in pulmonary  So,   - please ask wife who renal doc is and I Am happy to make that phone call and make sure this care is coordinated and please get hold of that renal doc for me - if she does not know who the rena doc is please have PPCP [name], MD call me anytime on my cell

## 2015-04-07 NOTE — Telephone Encounter (Signed)
lmomtcb x1 

## 2015-04-08 NOTE — Telephone Encounter (Signed)
lmtcb for pt.  

## 2015-04-09 NOTE — Telephone Encounter (Signed)
lmtcb x3 for pt. Message will be closed per triage protocol. If/when pt calls back a new message will need to be created.

## 2015-04-18 ENCOUNTER — Encounter (HOSPITAL_COMMUNITY): Payer: Self-pay | Admitting: Emergency Medicine

## 2015-04-18 ENCOUNTER — Emergency Department (HOSPITAL_BASED_OUTPATIENT_CLINIC_OR_DEPARTMENT_OTHER): Payer: Medicare Other

## 2015-04-18 ENCOUNTER — Inpatient Hospital Stay (HOSPITAL_COMMUNITY)
Admission: EM | Admit: 2015-04-18 | Discharge: 2015-04-26 | DRG: 280 | Disposition: A | Discharge status: Home Health | Payer: Medicare Other | Attending: Internal Medicine | Admitting: Internal Medicine

## 2015-04-18 ENCOUNTER — Emergency Department (HOSPITAL_COMMUNITY): Payer: Medicare Other

## 2015-04-18 DIAGNOSIS — I214 Non-ST elevation (NSTEMI) myocardial infarction: Secondary | ICD-10-CM

## 2015-04-18 DIAGNOSIS — K219 Gastro-esophageal reflux disease without esophagitis: Secondary | ICD-10-CM | POA: Diagnosis present

## 2015-04-18 DIAGNOSIS — N2581 Secondary hyperparathyroidism of renal origin: Secondary | ICD-10-CM | POA: Diagnosis present

## 2015-04-18 DIAGNOSIS — M351 Other overlap syndromes: Secondary | ICD-10-CM | POA: Diagnosis present

## 2015-04-18 DIAGNOSIS — M1990 Unspecified osteoarthritis, unspecified site: Secondary | ICD-10-CM | POA: Diagnosis present

## 2015-04-18 DIAGNOSIS — E875 Hyperkalemia: Secondary | ICD-10-CM | POA: Diagnosis present

## 2015-04-18 DIAGNOSIS — I82401 Acute embolism and thrombosis of unspecified deep veins of right lower extremity: Secondary | ICD-10-CM | POA: Diagnosis not present

## 2015-04-18 DIAGNOSIS — F419 Anxiety disorder, unspecified: Secondary | ICD-10-CM | POA: Diagnosis present

## 2015-04-18 DIAGNOSIS — R21 Rash and other nonspecific skin eruption: Secondary | ICD-10-CM | POA: Diagnosis present

## 2015-04-18 DIAGNOSIS — R059 Cough, unspecified: Secondary | ICD-10-CM

## 2015-04-18 DIAGNOSIS — J9601 Acute respiratory failure with hypoxia: Secondary | ICD-10-CM | POA: Diagnosis present

## 2015-04-18 DIAGNOSIS — Z992 Dependence on renal dialysis: Secondary | ICD-10-CM | POA: Diagnosis not present

## 2015-04-18 DIAGNOSIS — R0602 Shortness of breath: Secondary | ICD-10-CM

## 2015-04-18 DIAGNOSIS — D638 Anemia in other chronic diseases classified elsewhere: Secondary | ICD-10-CM | POA: Diagnosis present

## 2015-04-18 DIAGNOSIS — E43 Unspecified severe protein-calorie malnutrition: Secondary | ICD-10-CM | POA: Diagnosis present

## 2015-04-18 DIAGNOSIS — R197 Diarrhea, unspecified: Secondary | ICD-10-CM | POA: Diagnosis present

## 2015-04-18 DIAGNOSIS — J8410 Pulmonary fibrosis, unspecified: Secondary | ICD-10-CM | POA: Diagnosis present

## 2015-04-18 DIAGNOSIS — R05 Cough: Secondary | ICD-10-CM

## 2015-04-18 DIAGNOSIS — I5043 Acute on chronic combined systolic (congestive) and diastolic (congestive) heart failure: Secondary | ICD-10-CM | POA: Diagnosis present

## 2015-04-18 DIAGNOSIS — I120 Hypertensive chronic kidney disease with stage 5 chronic kidney disease or end stage renal disease: Secondary | ICD-10-CM | POA: Diagnosis present

## 2015-04-18 DIAGNOSIS — Z86718 Personal history of other venous thrombosis and embolism: Secondary | ICD-10-CM

## 2015-04-18 DIAGNOSIS — Y95 Nosocomial condition: Secondary | ICD-10-CM | POA: Diagnosis present

## 2015-04-18 DIAGNOSIS — E872 Acidosis: Secondary | ICD-10-CM | POA: Diagnosis present

## 2015-04-18 DIAGNOSIS — I953 Hypotension of hemodialysis: Secondary | ICD-10-CM | POA: Diagnosis not present

## 2015-04-18 DIAGNOSIS — M3500 Sicca syndrome, unspecified: Secondary | ICD-10-CM | POA: Diagnosis present

## 2015-04-18 DIAGNOSIS — I82402 Acute embolism and thrombosis of unspecified deep veins of left lower extremity: Secondary | ICD-10-CM

## 2015-04-18 DIAGNOSIS — I5033 Acute on chronic diastolic (congestive) heart failure: Secondary | ICD-10-CM | POA: Diagnosis present

## 2015-04-18 DIAGNOSIS — I10 Essential (primary) hypertension: Secondary | ICD-10-CM | POA: Diagnosis present

## 2015-04-18 DIAGNOSIS — R410 Disorientation, unspecified: Secondary | ICD-10-CM

## 2015-04-18 DIAGNOSIS — M311 Thrombotic microangiopathy: Secondary | ICD-10-CM | POA: Diagnosis present

## 2015-04-18 DIAGNOSIS — I429 Cardiomyopathy, unspecified: Secondary | ICD-10-CM | POA: Diagnosis present

## 2015-04-18 DIAGNOSIS — K5900 Constipation, unspecified: Secondary | ICD-10-CM | POA: Diagnosis present

## 2015-04-18 DIAGNOSIS — I82409 Acute embolism and thrombosis of unspecified deep veins of unspecified lower extremity: Secondary | ICD-10-CM | POA: Diagnosis present

## 2015-04-18 DIAGNOSIS — J189 Pneumonia, unspecified organism: Secondary | ICD-10-CM | POA: Diagnosis present

## 2015-04-18 DIAGNOSIS — I5021 Acute systolic (congestive) heart failure: Secondary | ICD-10-CM | POA: Diagnosis present

## 2015-04-18 DIAGNOSIS — R7989 Other specified abnormal findings of blood chemistry: Secondary | ICD-10-CM | POA: Diagnosis not present

## 2015-04-18 DIAGNOSIS — Z6824 Body mass index (BMI) 24.0-24.9, adult: Secondary | ICD-10-CM

## 2015-04-18 DIAGNOSIS — R079 Chest pain, unspecified: Secondary | ICD-10-CM | POA: Diagnosis not present

## 2015-04-18 DIAGNOSIS — I5031 Acute diastolic (congestive) heart failure: Secondary | ICD-10-CM

## 2015-04-18 DIAGNOSIS — Z79899 Other long term (current) drug therapy: Secondary | ICD-10-CM

## 2015-04-18 DIAGNOSIS — N186 End stage renal disease: Secondary | ICD-10-CM | POA: Diagnosis present

## 2015-04-18 DIAGNOSIS — M358 Other specified systemic involvement of connective tissue: Secondary | ICD-10-CM | POA: Diagnosis not present

## 2015-04-18 DIAGNOSIS — R000 Tachycardia, unspecified: Secondary | ICD-10-CM

## 2015-04-18 HISTORY — DX: End stage renal disease: N18.6

## 2015-04-18 HISTORY — DX: Non-ST elevation (NSTEMI) myocardial infarction: I21.4

## 2015-04-18 HISTORY — DX: Dependence on renal dialysis: Z99.2

## 2015-04-18 LAB — BASIC METABOLIC PANEL
Anion gap: 11 (ref 5–15)
BUN: 11 mg/dL (ref 6–20)
CO2: 29 mmol/L (ref 22–32)
Calcium: 8.3 mg/dL — ABNORMAL LOW (ref 8.9–10.3)
Chloride: 96 mmol/L — ABNORMAL LOW (ref 101–111)
Creatinine, Ser: 4.32 mg/dL — ABNORMAL HIGH (ref 0.61–1.24)
GFR calc non Af Amer: 12 mL/min — ABNORMAL LOW (ref >60)
GFR, EST AFRICAN AMERICAN: 14 mL/min — AB (ref >60)
GLUCOSE: 117 mg/dL — AB (ref 65–99)
POTASSIUM: 5.7 mmol/L — AB (ref 3.5–5.1)
Sodium: 136 mmol/L (ref 135–145)

## 2015-04-18 LAB — CBC WITH DIFFERENTIAL/PLATELET
BASOS PCT: 0 % (ref 0–1)
Basophils Absolute: 0.1 10*3/uL (ref 0.0–0.1)
EOS PCT: 0 % (ref 0–5)
Eosinophils Absolute: 0 10*3/uL (ref 0.0–0.7)
HCT: 44.4 % (ref 39.0–52.0)
Hemoglobin: 14 g/dL (ref 13.0–17.0)
Lymphocytes Relative: 2 % — ABNORMAL LOW (ref 12–46)
Lymphs Abs: 0.4 10*3/uL — ABNORMAL LOW (ref 0.7–4.0)
MCH: 28.8 pg (ref 26.0–34.0)
MCHC: 31.5 g/dL (ref 30.0–36.0)
MCV: 91.4 fL (ref 78.0–100.0)
Monocytes Absolute: 1.6 10*3/uL — ABNORMAL HIGH (ref 0.1–1.0)
Monocytes Relative: 11 % (ref 3–12)
NEUTROS PCT: 87 % — AB (ref 43–77)
Neutro Abs: 13.1 10*3/uL — ABNORMAL HIGH (ref 1.7–7.7)
PLATELETS: 119 10*3/uL — AB (ref 150–400)
RBC: 4.86 MIL/uL (ref 4.22–5.81)
RDW: 20.4 % — ABNORMAL HIGH (ref 11.5–15.5)
WBC: 15.1 10*3/uL — AB (ref 4.0–10.5)

## 2015-04-18 LAB — I-STAT TROPONIN, ED: Troponin i, poc: 0.68 ng/mL (ref 0.00–0.08)

## 2015-04-18 LAB — TROPONIN I: Troponin I: 0.56 ng/mL (ref <0.031)

## 2015-04-18 LAB — BRAIN NATRIURETIC PEPTIDE

## 2015-04-18 LAB — CBG MONITORING, ED: GLUCOSE-CAPILLARY: 98 mg/dL (ref 65–99)

## 2015-04-18 MED ORDER — ASPIRIN 81 MG PO CHEW
324.0000 mg | CHEWABLE_TABLET | Freq: Once | ORAL | Status: AC
  Administered 2015-04-18: 324 mg via ORAL
  Filled 2015-04-18: qty 4

## 2015-04-18 MED ORDER — ONDANSETRON HCL 4 MG/2ML IJ SOLN
4.0000 mg | Freq: Once | INTRAMUSCULAR | Status: AC
  Administered 2015-04-18: 4 mg via INTRAVENOUS
  Filled 2015-04-18 (×2): qty 2

## 2015-04-18 MED ORDER — HEPARIN (PORCINE) IN NACL 100-0.45 UNIT/ML-% IJ SOLN
1850.0000 [IU]/h | INTRAMUSCULAR | Status: DC
  Administered 2015-04-18: 1050 [IU]/h via INTRAVENOUS
  Administered 2015-04-20: 1850 [IU]/h via INTRAVENOUS
  Filled 2015-04-18 (×8): qty 250

## 2015-04-18 MED ORDER — CARVEDILOL 6.25 MG PO TABS
6.2500 mg | ORAL_TABLET | Freq: Two times a day (BID) | ORAL | Status: DC
  Administered 2015-04-19 – 2015-04-22 (×7): 6.25 mg via ORAL
  Filled 2015-04-18 (×11): qty 1

## 2015-04-18 MED ORDER — HEPARIN SODIUM (PORCINE) 5000 UNIT/ML IJ SOLN
60.0000 [IU]/kg | Freq: Once | INTRAMUSCULAR | Status: DC
Start: 2015-04-18 — End: 2015-04-18

## 2015-04-18 MED ORDER — HEPARIN BOLUS VIA INFUSION
4000.0000 [IU] | Freq: Once | INTRAVENOUS | Status: AC
  Administered 2015-04-18: 4000 [IU] via INTRAVENOUS
  Filled 2015-04-18: qty 4000

## 2015-04-18 MED ORDER — VANCOMYCIN HCL 10 G IV SOLR
2000.0000 mg | Freq: Once | INTRAVENOUS | Status: AC
  Administered 2015-04-19: 2000 mg via INTRAVENOUS
  Filled 2015-04-18: qty 2000

## 2015-04-18 MED ORDER — ACETAMINOPHEN 650 MG RE SUPP: 650.0000 mg | Freq: Four times a day (QID) | RECTAL | Status: DC | PRN

## 2015-04-18 MED ORDER — GUAIFENESIN ER 600 MG PO TB12
600.0000 mg | ORAL_TABLET | Freq: Two times a day (BID) | ORAL | Status: DC
  Administered 2015-04-18 – 2015-04-25 (×15): 600 mg via ORAL
  Filled 2015-04-18 (×18): qty 1

## 2015-04-18 MED ORDER — VANCOMYCIN HCL IN DEXTROSE 1-5 GM/200ML-% IV SOLN: 1000.0000 mg | INTRAVENOUS | Status: DC

## 2015-04-18 MED ORDER — PIPERACILLIN-TAZOBACTAM IN DEX 2-0.25 GM/50ML IV SOLN
2.2500 g | Freq: Three times a day (TID) | INTRAVENOUS | Status: DC
  Administered 2015-04-19 – 2015-04-22 (×11): 2.25 g via INTRAVENOUS
  Filled 2015-04-18 (×13): qty 50

## 2015-04-18 MED ORDER — HEPARIN (PORCINE) IN NACL 100-0.45 UNIT/ML-% IJ SOLN: 12.0000 [IU]/kg/h | INTRAMUSCULAR | Status: DC

## 2015-04-18 MED ORDER — ONDANSETRON HCL 4 MG PO TABS: 4.0000 mg | ORAL_TABLET | Freq: Four times a day (QID) | ORAL | Status: DC | PRN

## 2015-04-18 MED ORDER — SODIUM CHLORIDE 0.9 % IJ SOLN
3.0000 mL | Freq: Two times a day (BID) | INTRAMUSCULAR | Status: DC
  Administered 2015-04-18 – 2015-04-25 (×11): 3 mL via INTRAVENOUS

## 2015-04-18 MED ORDER — ACETAMINOPHEN 325 MG PO TABS: 650.0000 mg | ORAL_TABLET | Freq: Four times a day (QID) | ORAL | Status: DC | PRN

## 2015-04-18 MED ORDER — SODIUM POLYSTYRENE SULFONATE 15 GM/60ML PO SUSP
15.0000 g | Freq: Once | ORAL | Status: AC
  Administered 2015-04-18: 15 g via ORAL

## 2015-04-18 MED ORDER — ATORVASTATIN CALCIUM 40 MG PO TABS
40.0000 mg | ORAL_TABLET | Freq: Every day | ORAL | Status: DC
  Administered 2015-04-18 – 2015-04-24 (×6): 40 mg via ORAL
  Filled 2015-04-18 (×10): qty 1

## 2015-04-18 MED ORDER — SODIUM POLYSTYRENE SULFONATE 15 GM/60ML PO SUSP
30.0000 g | Freq: Once | ORAL | Status: DC
  Filled 2015-04-18: qty 120

## 2015-04-18 MED ORDER — ONDANSETRON HCL 4 MG/2ML IJ SOLN: 4.0000 mg | Freq: Four times a day (QID) | INTRAMUSCULAR | Status: DC | PRN

## 2015-04-18 MED ORDER — PANTOPRAZOLE SODIUM 40 MG PO TBEC
40.0000 mg | DELAYED_RELEASE_TABLET | Freq: Every day | ORAL | Status: DC
  Administered 2015-04-19 – 2015-04-25 (×7): 40 mg via ORAL
  Filled 2015-04-18 (×5): qty 1

## 2015-04-18 MED ORDER — METOPROLOL TARTRATE 1 MG/ML IV SOLN
5.0000 mg | Freq: Once | INTRAVENOUS | Status: AC
  Administered 2015-04-18: 5 mg via INTRAVENOUS
  Filled 2015-04-18: qty 5

## 2015-04-18 NOTE — Progress Notes (Addendum)
ANTIBIOTIC CONSULT NOTE - INITIAL  Pharmacy Consult for Vancomycin/Zosyn  Indication: rule out pneumonia  No Known Allergies  Patient Measurements: Weight: 200 lb 2.8 oz (90.8 kg) Vital Signs: Temp: 98.1 F (36.7 C) (08/05 2017) Temp Source: Oral (08/05 1534) BP: 145/114 mmHg (08/05 2130) Pulse Rate: 128 (08/05 2130)  Labs:  Recent Labs  04/18/15 1625  WBC 15.1*  HGB 14.0  PLT 119*  CREATININE 4.32*   Estimated Creatinine Clearance: 17.3 mL/min (by C-G formula based on Cr of 4.32). No results for input(s): VANCOTROUGH, VANCOPEAK, VANCORANDOM, GENTTROUGH, GENTPEAK, GENTRANDOM, TOBRATROUGH, TOBRAPEAK, TOBRARND, AMIKACINPEAK, AMIKACINTROU, AMIKACIN in the last 72 hours.   Medical History: Past Medical History  Diagnosis Date  . Medical history non-contributory   . Arthritis   . Pneumonia   . GERD (gastroesophageal reflux disease)   . Sjogren's disease 01/28/2015  . Pulmonary fibrosis   . Renal disorder   . Acute on chronic diastolic CHF (congestive heart failure) 04/18/2015     Assessment: 74 y/o M with shortness of breath/tachycardia, pt has ESRD on HD MWF, WBC is elevated, pt is afebrile, other labs reviewed.   Goal of Therapy:  Pre-HD vancomycin level 15-25 mg/L  Plan:  -Vancomycin 2000 mg IV x 1, then 1000 mg IV qHD MWF -Zosyn 2.25g IV q8h -Trend WBC, temp, HD schedule -Drug levels as indicated   Narda Bonds 04/18/2015,11:16 PM  Patient's weight is 73 kg (was documented as ~90 kg last night), so will change his vancomycin dose to 750 mg IV qMWF-HD.  F/u toleration of HD session today and f/u if needs any additional HD sessions tomorrow.  Sarina Robleto L. Nicole Kindred, PharmD Clinical Pharmacy Resident Pager: [phone] 04/19/2015 8:34 AM

## 2015-04-18 NOTE — ED Notes (Signed)
Informed PA of pt becoming more restless and nausea. Verbal order for zofran obtained

## 2015-04-18 NOTE — ED Notes (Signed)
Pt becoming more restless in the bed; remains tachypneic at 32-40 respirations, sats 97% on 2L oxygen via [place]. Pt also dry heaving. CBG 98

## 2015-04-18 NOTE — ED Notes (Signed)
Pt denies nausea at this time; zofran held per pt request

## 2015-04-18 NOTE — ED Provider Notes (Signed)
CSN: [id number]     Arrival date & time 04/18/15  1527 History   First MD Initiated Contact with Patient 04/18/15 1537     Chief Complaint  Patient presents with  . Tachycardia  . Hyperventilating  . Hypertension     (Consider location/radiation/quality/duration/timing/severity/associated sxs/prior Treatment) HPI  Travis [name] Is a 74 year old male with a past medical history of end-stage renal disease and pulmonary fibrosis who presents the emergency department, chief complaint of shortness of breath and racing heart. History is given by the patient, a relative and his wife. The patient was at dialysis today when EMS was called for shortness of breath. Patient states that he was able to complete the majority of his dialysis session. His wife states that yesterday he began complaining of difficulty breathing. She states that he woke up several times during the night feeling short of breath. He denies chest pain, cough, fever, rigors. He has chronic feelings of being very cold, especially over the past 6 weeks. Patient does have a history of anemia of chronic disease. He has a history of previous DVTs and is not on any anticoagulants, but denies any acute onset of shortness of breath.  Past Medical History  Diagnosis Date  . Medical history non-contributory   . Arthritis   . Pneumonia   . GERD (gastroesophageal reflux disease)   . Sjogren's disease 01/28/2015  . Pulmonary fibrosis   . Renal disorder    Past Surgical History  Procedure Laterality Date  . Hemorroidectomy  1999  . Surgical procedure to remove a mole as a child Right Eye area    At around 27 years old  . Insertion of dialysis catheter N/A 02/17/2015    Procedure: INSERTION OF DIALYSIS CATHETER RIGHT INTERNAL JUGULAR VEIN;  Surgeon: Elam Dutch, MD;  Location: [facility];  Service: Vascular;  Laterality: N/A;  . Av fistula placement Right 02/17/2015    Procedure: INSERTION OF RIGHT ARM  ARTERIOVENOUS (AV) GORE-TEX GRAFT ;   Surgeon: Elam Dutch, MD;  Location[facility];  Service: Vascular;  Laterality: Right;   Family History  Problem Relation Age of Onset  . Hypertension Mother    History  Substance Use Topics  . Smoking status: Never Smoker   . Smokeless tobacco: Never Used  . Alcohol Use: No    Review of Systems  Ten systems reviewed and are negative for acute change, except as noted in the HPI.    Allergies  Review of patient's allergies indicates no known allergies.  Home Medications   Prior to Admission medications   Medication Sig Start Date End Date Taking? Authorizing Provider  cyanocobalamin 500 MCG tablet Take 1 tablet (500 mcg total) by mouth daily. 12/26/14   Donne Hazel, MD  feeding supplement, ENSURE ENLIVE, (ENSURE ENLIVE) LIQD Take 237 mLs by mouth 2 (two) times daily between meals. 01/09/15   Geradine Girt, DO  ferrous sulfate 325 (65 FE) MG tablet Take 1 tablet (325 mg total) by mouth 2 (two) times daily with a meal. 12/26/14   Donne Hazel, MD  furosemide (LASIX) 40 MG tablet Take 1 tablet (40 mg total) by mouth daily. 01/28/15   Brand Males, MD  hydrOXYzine (ATARAX/VISTARIL) 10 MG tablet Take 1 tablet (10 mg total) by mouth 3 (three) times daily as needed. 02/19/15   Kelvin Cellar, MD  loratadine (CLARITIN) 10 MG tablet Take 10 mg by mouth daily.    Historical Provider, MD  NIFEdipine (PROCARDIA-XL/ADALAT CC) 30 MG 24  hr tablet Take 1 tablet (30 mg total) by mouth daily. 02/19/15   Kelvin Cellar, MD  nystatin (MYCOSTATIN) 100000 UNIT/ML suspension Take 5 mLs (500,000 Units total) by mouth 4 (four) times daily. For 7 days 01/09/15   Geradine Girt, DO  pantoprazole (PROTONIX) 40 MG tablet Take 1 tablet (40 mg total) by mouth daily. 01/31/15   Barton Dubois, MD  traMADol-acetaminophen (ULTRACET) 37.5-325 MG per tablet Take 1-2 tablets by mouth every 4 (four) hours as needed for severe pain. 12/26/14   Donne Hazel, MD   BP 160/102 mmHg  Pulse 120  Temp(Src) 97.4 F (36.3  C) (Oral)  Resp 20  SpO2 100% Physical Exam  Constitutional: He is oriented to person, place, and time. He appears well-developed. He has a sickly appearance. No distress.  HENT:  Head: Normocephalic and atraumatic.  Eyes: EOM are normal. Pupils are equal, round, and reactive to light.  Neck: Neck supple.  Cardiovascular: Regular rhythm and intact distal pulses.   No murmur heard. tachycardia  Pulmonary/Chest:  Poor air movement Tachypnea Fine crackles  Abdominal: Soft. Bowel sounds are normal. He exhibits no distension. There is no tenderness.  Musculoskeletal: He exhibits edema (1+ peripheral edema).  Neurological: He is alert and oriented to person, place, and time.  Skin: Skin is warm and dry.  Nursing note and vitals reviewed.   ED Course  CRITICAL CARE Performed by: Margarita Mail Authorized by: Margarita Mail Total critical care time: 45 minutes Critical care time was exclusive of separately billable procedures and treating other patients and teaching time. Critical care was necessary to treat or prevent imminent or life-threatening deterioration of the following conditions: ACS. Critical care was time spent personally by me on the following activities: development of treatment plan with patient or surrogate, discussions with consultants, interpretation of cardiac output measurements, evaluation of patient's response to treatment, examination of patient, obtaining history from patient or surrogate, ordering and performing treatments and interventions, ordering and review of laboratory studies, ordering and review of radiographic studies, pulse oximetry, re-evaluation of patient's condition and review of old charts.   (including critical care time) Labs Review Labs Reviewed - No data to display  Imaging Review No results found.   EKG Interpretation   Date/Time:  Friday April 18 2015 15:28:41 EDT Ventricular Rate:  119 PR Interval:  167 QRS Duration: 96 QT  Interval:  353 QTC Calculation: 497 R Axis:   -11 Text Interpretation:  Sinus tachycardia Anteroseptal infarct, age  indeterminate T wave flattening inferior leads, T wave inversions  diffusely, U wave w/ borderline elevation in V2-V3 Confirmed by LITTLE MD,  RACHEL [id number]) on 04/18/2015 4:01:49 PM      MDM   Final diagnoses:  NSTEMI (non-ST elevated myocardial infarction)  Tachycardia  SOB (shortness of breath)    4:02 PM Patient with end-stage renal disease able to complete most of his dialysis. EKG has concerning changes. Concern for acute coronary syndrome. Patient given aspirin, will rule out pulmonary embolus considering tachycardia and shortness of breath. Patient appears stable at this time.  4:34 PM BP 165/107 mmHg  Pulse 121  Temp(Src) 97.4 F (36.3 C) (Oral)  Resp 25  SpO2 100% Patient with elevated troponin. EKG changes suggestive of ischemia. Will begin treatment with Heparin    I have consulted with cardiology. Dr. Ellyn Hack is concerned for cardiac etiology. Asks for medicine adimmission Patient has persistent tachycardia.  Concern for PE however the patient cannot undergo CTA, and VQ in the setting of  pulmonary fibrosis is unhelpful. His CXR shows pulmonary edema and his duplex of the legs is negative.   Patient will be admitted to stepdown.   Margarita Mail, PA-C 04/19/15 [place], MD 04/19/15 [phone]

## 2015-04-18 NOTE — Progress Notes (Signed)
ANTICOAGULATION CONSULT NOTE - Initial Consult  Pharmacy Consult for heparin  Indication: chest pain/ACS  No Known Allergies  Patient Measurements: Weight: 200 lb 2.8 oz (90.8 kg)  IBW: 73 kg Heparin Dosing Weight: 90.8 kg  Vital Signs: Temp: 97.4 F (36.3 C) (08/05 1534) Temp Source: Oral (08/05 1534) BP: 165/107 mmHg (08/05 1608) Pulse Rate: 121 (08/05 1608)  Labs:  Recent Labs  04/18/15 1625  HGB 14.0  HCT 44.4  PLT PENDING  CREATININE 4.32*    Estimated Creatinine Clearance: 17.3 mL/min (by C-G formula based on Cr of 4.32).    Medical History: Past Medical History  Diagnosis Date  . Medical history non-contributory   . Arthritis   . Pneumonia   . GERD (gastroesophageal reflux disease)   . Sjogren's disease 01/28/2015  . Pulmonary fibrosis   . Renal disorder     Medications:   (Not in a hospital admission)  Assessment: 74 yo male admitted with chest pain, SOB,  initiating heparin gtt for ACS. Pt is ESRD, reportedly has hx of DVTs but is not on anticoagulation pta. H/h stable, plts slightly low, initial troponin positive.   Goal of Therapy:  Heparin level 0.3-0.7 units/ml Monitor platelets by anticoagulation protocol: Yes   Plan:  -Heparin 4000 units x1 then 1050 units/hr -Daily HL, CBC, check confirmatory level this evening -Monitor s/sx bleeding    Hughes Better, PharmD, BCPS Clinical Pharmacist Pager: [phone] 04/18/2015 4:55 PM

## 2015-04-18 NOTE — ED Notes (Signed)
Pt c/o sacral pain; repositioned in bed. Pt denies shortness of breath or chest pain at this time

## 2015-04-18 NOTE — ED Notes (Signed)
Cardiology at bedside.

## 2015-04-18 NOTE — Progress Notes (Signed)
Bilateral lower extremity venous duplex completed:  No obvious evidence of DVT, superficial thrombosis, or Baker's cyst.  Technically difficult study due to the patient's movements.

## 2015-04-18 NOTE — H&P (Deleted)
Entered in error

## 2015-04-18 NOTE — Consult Note (Addendum)
Patient ID: Travis [name] MRN: [id number], DOB/AGE: October 10, 1940   Admit date: 04/18/2015   Primary Physician: [name], MD Primary Cardiologist: new patient  Pt. Profile:  SOB, ESRD on HD, SOB  Problem List  Past Medical History  Diagnosis Date  . Medical history non-contributory   . Arthritis   . Pneumonia   . GERD (gastroesophageal reflux disease)   . Sjogren's disease 01/28/2015  . Pulmonary fibrosis   . Renal disorder     Past Surgical History  Procedure Laterality Date  . Hemorroidectomy  1999  . Surgical procedure to remove a mole as a child Right Eye area    At around 48 years old  . Insertion of dialysis catheter N/A 02/17/2015    Procedure: INSERTION OF DIALYSIS CATHETER RIGHT INTERNAL JUGULAR VEIN;  Surgeon: Elam Dutch, MD;  Location: [place];  Service: Vascular;  Laterality: N/A;  . Av fistula placement Right 02/17/2015    Procedure: INSERTION OF RIGHT ARM  ARTERIOVENOUS (AV) GORE-TEX GRAFT ;  Surgeon: Elam Dutch, MD;  Location: [place];  Service: Vascular;  Laterality: Right;     Allergies  No Known Allergies  HPI  Nathon Marple is a 74 y.o. gentleman with PMHx of arthritis and acid reflux until last month when he was hospitalized for CAP (4/5-4/14) with repeat admission for HCAP (4/25-4/28, treated with IV vancomycin and cefepime at that time), chest CT findings were suggestive of pleural effusions and pulmonary fibrosis, which led to a rheumatological work-up. The patient was found to be ANA neg, but SSA and SSB pos, Rf pos. He was discharged on prednisone and has rheumatology follow-up. He was readmitted on 02/17/2015 a DVT R leg below the knee. Admitted and started on IV heparin. He was found to be in renal failure which was new and nephrology was consulted. 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 Following the biopsy, patient has increased back pain, underwent a CT scan which  showed a large perinephric hemorrhage. His anticoagulation was discontinued and IR was consulted and patient underwent a IVC filter placement. Patient received 3 days of high dose solumedrol the prednisone tapered down and patient was started on plasmapheresis. After finishing 7 days of plasmapheresis and a prednisone taper his renal function has not recovered and nephrology started dialysis, first session on 6/6 and second on 6/7. Vascular surgery was consulted and patient underwent left AV fistula placement on 6/6.   Today he presented with an acute shortness of breath that started the last night when he was not able to sleep. He went to HD today and was sent to the ER. He seems to be restless, stating that he is SOB, denies any chest pain, palpitations, diaphoresis.  We were consulted for an elevated troponin.  He is hypoxic with SpO2 89% on 2L of O2 with .   Home Medications  Prior to Admission medications   Medication Sig Start Date End Date Taking? Authorizing Provider  cyanocobalamin 500 MCG tablet Take 1 tablet (500 mcg total) by mouth daily. 12/26/14  Yes Donne Hazel, MD  hydrOXYzine (ATARAX/VISTARIL) 10 MG tablet Take 1 tablet (10 mg total) by mouth 3 (three) times daily as needed. 02/19/15  Yes Kelvin Cellar, MD  multivitamin (RENA-VIT) TABS tablet Take 1 tablet by mouth daily.   Yes Historical Provider, MD  pantoprazole (PROTONIX) 40 MG tablet Take 1 tablet (40 mg total) by mouth daily. 01/31/15  Yes Barton Dubois, MD  triamcinolone cream (KENALOG) 0.1 % Apply 1 application topically 2 (two) times daily.   Yes Historical Provider, MD  white petrolatum (VASELINE) GEL Apply 1 application topically as needed for dry skin.   Yes Historical Provider, MD  ferrous sulfate 325 (65 FE) MG tablet Take 1 tablet (325 mg total) by mouth 2 (two) times daily with a meal. 12/26/14   Donne Hazel, MD  furosemide (LASIX) 40 MG tablet Take 1 tablet (40 mg total) by mouth daily. 01/28/15   Brand Males, MD  traMADol-acetaminophen (ULTRACET) 37.5-325 MG per tablet Take 1-2 tablets by mouth every 4 (four) hours as needed for severe pain. 12/26/14   Donne Hazel, MD   Family History  Family History  Problem Relation Age of Onset  . Hypertension Mother    Social History  History   Social History  . Marital Status: Married    Spouse Name: N/A  . Number of Children: N/A  . Years of Education: N/A   Occupational History  . Not on file.   Social History Main Topics  . Smoking status: Never Smoker   . Smokeless tobacco: Never Used  . Alcohol Use: No  . Drug Use: No  . Sexual Activity: Not on file   Other Topics Concern  . Not on file   Social History Narrative    Review of Systems General:  No chills, fever, night sweats or weight changes.  Cardiovascular:  No chest pain, dyspnea on exertion, edema, orthopnea, palpitations, paroxysmal nocturnal dyspnea. Dermatological: No rash, lesions/masses Respiratory: No cough, dyspnea Urologic: No hematuria, dysuria Abdominal:   No nausea, vomiting, diarrhea, bright red blood per rectum, melena, or hematemesis Neurologic:  No visual changes, wkns, changes in mental status. All other systems reviewed and are otherwise negative except as noted above.  Physical Exam  Blood pressure 154/99, pulse 128, temperature 97.4 F (36.3 C), temperature source Oral, resp. rate 33, weight 200 lb 2.8 oz (90.8 kg), SpO2 93 %.  General: Pleasant, in acute distress Psych: Normal affect. Neuro: Alert and oriented X 3. Moves all extremities spontaneously. HEENT: Normal  Neck: Supple without bruits, JVD + 8 cm B/L Lungs:  Resp regular and unlabored, crackles both bases. Heart: RRR no s3, s4, or murmurs. Abdomen: Soft, non-tender, non-distended, BS + x 4.  Extremities: No clubbing, cyanosis or edema. DP/PT/Radials 2+ and equal bilaterally.  Labs  No results for input(s): CKTOTAL, CKMB, TROPONINI in the last 72 hours. Lab Results    Component Value Date   WBC 15.1* 04/18/2015   HGB 14.0 04/18/2015   HCT 44.4 04/18/2015   MCV 91.4 04/18/2015   PLT 119* 04/18/2015    Recent Labs Lab 04/18/15 1625  NA 136  K 5.7*  CL 96*  CO2 29  BUN 11  CREATININE 4.32*  CALCIUM 8.3*  GLUCOSE 117*   No results found for: CHOL, HDL, LDLCALC, TRIG Lab Results  Component Value Date   DDIMER 4.65* 01/02/2015   Radiology/Studies  Dg Chest Port 1 View  04/18/2015   CLINICAL DATA:  74 year old male with shortness of breath, tachycardia. Renal failure on dialysis for 3 weeks. Initial encounter.  EXAM: PORTABLE CHEST - 1 VIEW  COMPARISON:  02/17/2015 and earlier.  FINDINGS: Portable AP semi upright view at 1605 hr. Tunneled right chest dialysis catheter is no longer present. Interval decreased but not resolved pleural effusions, residual more apparent on the right. Continued pulmonary vascular congestion, but also improved. Stable cardiac size and mediastinal contours. No pneumothorax.  No consolidation.  IMPRESSION: Improved but not resolved pulmonary edema and small pleural effusions since June.  Tunneled right chest dialysis catheter has been removed.   Electronically Signed   By: Genevie Ann M.D.   On: 04/18/2015 16:15   Echocardiogram - 12/18/2014 - Left ventricle: The cavity size was normal. Wall thickness was increased in a pattern of mild LVH. Systolic function was normal. The estimated ejection fraction was in the range of 60% to 65%. Wall motion was normal; there were no regional wall motion abnormalities. Left ventricular diastolic function parameters were normal. - Right atrium: The atrium was mildly dilated. - Atrial septum: No defect or patent foramen ovale was identified. - Pericardium, extracardiac: A trivial pericardial effusion was identified.  ECG: ST, anterolateral negative T waves suggestive of ischemia, new when compared to 02/20/2015    ASSESSMENT AND PLAN  1. Acute SOB - with hypoxia, he has signs  of pulmonary edema of CXR, however very complicated history with recent 2 episodes of pneumonias, possible pulmonary fibrosis, and possible embolism now (recent DVT not on anticoagulation)  2. Acute diastolic CHF - the patient didn't complete HD, I would repeat HD ASAP.   3. Troponin elevation with new anterolateral changes on ECG suggestive of ischemia - the first troponin 0.68, started on Heparin, continue cycling ECGs and Troponin, we will order an echocardiogram. The patient is not a good candidate for a cath right now (unable to lay flat) and possibly not in a long term (new AKI with new HD, nephrology not recommending using iodine contrast) also he is a poor long term anticoagulation candidate. If significant Troponin elevation and abnormal echo (prior showed normal LVEF with normal wall motion) we might consider cath once he improves clinically - start aspirin 81 mg po daily and atorvastatin 80 mg po daily  4. Hypertension - start carvedilol 6.25 mg po BID  5. ESRD on HD - nephrology follows  I recommend to call [facility] for admission and management of respiratory failure. We will follow.  DVT PPX - heparin drip   Signed, Dorothy Spark, MD, [facility] 04/18/2015, 7:51 PM

## 2015-04-18 NOTE — ED Notes (Addendum)
EMS - Patient coming from dialysis with tachypnea (R 40 on 2L [place]), tachycardia (HR 115-120) and Hypertension 161/108 with history.  Patient was 1 Liter short of finishing dialysis.  Symptoms started during dialysis today.  Patient c/o of generalized body rash to the bilateral arms and chest that started 10 days ago.

## 2015-04-18 NOTE — H&P (Signed)
[facility] History and Physical  Patient: Travis [name]  MRN: [id number]  DOB: Feb 25, 1941  DOS: the patient was seen and examined on 04/18/2015 PCP: [name], MD  Referring physician: Margarita Mail Chief Complaint: Shortness of breath  HPI: Travis [name] is a 74 y.o. male with Past medical history of ESRD on hemodialysis, GERD, pulmonary fibrosis, Sjogren's disease, chronic diastolic dysfunction, hypertension. The patient is presenting with complaints of progressively worsening shortness of breath since last 2 days. The patient also hasn't this any worsening cough with yellow expectoration. He denies any active bleeding. He went for hemodialysis today and had partial treatment is started having complaints of shortness of breath worsening as well as chest pain and therefore he was brought here for further workup. He complains of fever as well as chills. Denies any nausea or vomiting and denies any choking episode denies any diarrhea or constipation. He denies any focal deficit as well. Patient was initially consulted to cardiology later on recommended the patient to be admitted to hospitalist service.  The patient is coming from home.  And is independent for most of his ADL manages her medication on his own.  Review of Systems: as mentioned in the history of present illness.  A comprehensive review of the other systems is negative.  Past Medical History  Diagnosis Date  . Medical history non-contributory   . Arthritis   . Pneumonia   . GERD (gastroesophageal reflux disease)   . Sjogren's disease 01/28/2015  . Pulmonary fibrosis   . Renal disorder   . Acute on chronic diastolic CHF (congestive heart failure) 04/18/2015   Past Surgical History  Procedure Laterality Date  . Hemorroidectomy  1999  . Surgical procedure to remove a mole as a child Right Eye area    At around 89 years old  . Insertion of dialysis catheter N/A 02/17/2015    Procedure: INSERTION OF DIALYSIS  CATHETER RIGHT INTERNAL JUGULAR VEIN;  Surgeon: Elam Dutch, MD;  Location: [place];  Service: Vascular;  Laterality: N/A;  . Av fistula placement Right 02/17/2015    Procedure: INSERTION OF RIGHT ARM  ARTERIOVENOUS (AV) GORE-TEX GRAFT ;  Surgeon: Elam Dutch, MD;  Location: [place];  Service: Vascular;  Laterality: Right;   Social History:  reports that he has never smoked. He has never used smokeless tobacco. He reports that he does not drink alcohol or use illicit drugs.  No Known Allergies  Family History  Problem Relation Age of Onset  . Hypertension Mother     Prior to Admission medications   Medication Sig Start Date End Date Taking? Authorizing Provider  cyanocobalamin 500 MCG tablet Take 1 tablet (500 mcg total) by mouth daily. 12/26/14  Yes Donne Hazel, MD  hydrOXYzine (ATARAX/VISTARIL) 10 MG tablet Take 1 tablet (10 mg total) by mouth 3 (three) times daily as needed. 02/19/15  Yes Kelvin Cellar, MD  multivitamin (RENA-VIT) TABS tablet Take 1 tablet by mouth daily.   Yes Historical Provider, MD  pantoprazole (PROTONIX) 40 MG tablet Take 1 tablet (40 mg total) by mouth daily. 01/31/15  Yes Barton Dubois, MD  triamcinolone cream (KENALOG) 0.1 % Apply 1 application topically 2 (two) times daily.   Yes Historical Provider, MD  white petrolatum (VASELINE) GEL Apply 1 application topically as needed for dry skin.   Yes Historical Provider, MD  ferrous sulfate 325 (65 FE) MG tablet Take 1 tablet (325 mg total) by mouth 2 (two) times daily with a meal. 12/26/14   Annie Main  Dierdre Highman, MD  furosemide (LASIX) 40 MG tablet Take 1 tablet (40 mg total) by mouth daily. 01/28/15   Brand Males, MD  traMADol-acetaminophen (ULTRACET) 37.5-325 MG per tablet Take 1-2 tablets by mouth every 4 (four) hours as needed for severe pain. 12/26/14   Donne Hazel, MD    Physical Exam: Filed Vitals:   04/18/15 2100 04/18/15 2130 04/18/15 2302 04/18/15 2324  BP: 156/100 145/114    Pulse: 130 128  106    Temp:    97.7 F (36.5 C)  TempSrc:    Oral  Resp: 42 43  28  Weight:      SpO2: 98% 99% 99% 91%    General: Alert, Awake and Oriented to Time, Place and Person. Appear in moderate distress Eyes: PERRL ENT: Oral Mucosa clear moist. Neck: Present JVD Cardiovascular: S1 and S2 Present, aortic systolic Murmur, Peripheral Pulses Present Respiratory: Bilateral Air entry equal and Decreased,  Bilateral basal Crackles, no wheezes Abdomen: Bowel Sound present, Soft and no tender Skin: No Rash Extremities: Trace Pedal edema, no calf tenderness Neurologic: Grossly no focal neuro deficitother than lethargy  Labs on Admission:  CBC:  Recent Labs Lab 04/18/15 1625  WBC 15.1*  NEUTROABS 13.1*  HGB 14.0  HCT 44.4  MCV 91.4  PLT 119*    CMP     Component Value Date/Time   NA 136 04/18/2015 1625   K 5.7* 04/18/2015 1625   CL 96* 04/18/2015 1625   CO2 29 04/18/2015 1625   GLUCOSE 117* 04/18/2015 1625   BUN 11 04/18/2015 1625   CREATININE 4.32* 04/18/2015 1625   CALCIUM 8.3* 04/18/2015 1625   PROT 5.4* 01/30/2015 0239   ALBUMIN 2.7* 02/19/2015 0451   AST 31 01/30/2015 0239   ALT 13* 01/30/2015 0239   ALKPHOS 65 01/30/2015 0239   BILITOT 0.9 01/30/2015 0239   GFRNONAA 12* 04/18/2015 1625   GFRAA 14* 04/18/2015 1625    No results for input(s): LIPASE, AMYLASE in the last 168 hours.   Recent Labs Lab 04/18/15 2204  TROPONINI 0.56*   BNP (last 3 results)  Recent Labs  01/02/15 1241 01/06/15 1758 04/18/15 1626  BNP 66.6 83.3 >4500.0*    ProBNP (last 3 results) No results for input(s): PROBNP in the last 8760 hours.   Radiological Exams on Admission: Dg Chest Port 1 View  04/18/2015   CLINICAL DATA:  74 year old male with shortness of breath, tachycardia. Renal failure on dialysis for 3 weeks. Initial encounter.  EXAM: PORTABLE CHEST - 1 VIEW  COMPARISON:  02/17/2015 and earlier.  FINDINGS: Portable AP semi upright view at 1605 hr. Tunneled right chest dialysis  catheter is no longer present. Interval decreased but not resolved pleural effusions, residual more apparent on the right. Continued pulmonary vascular congestion, but also improved. Stable cardiac size and mediastinal contours. No pneumothorax. No consolidation.  IMPRESSION: Improved but not resolved pulmonary edema and small pleural effusions since June.  Tunneled right chest dialysis catheter has been removed.   Electronically Signed   By: Genevie Ann M.D.   On: 04/18/2015 16:15   EKG: Independently reviewed. nonspecific ST and T waves changes, sinus tachycardia.  Assessment/Plan Principal Problem:   NSTEMI (non-ST elevated myocardial infarction) Active Problems:   Pulmonary fibrosis   HCAP (healthcare-associated pneumonia)   Sjogren's disease   DVT (deep venous thrombosis)   ESRD on dialysis   Acute on chronic diastolic CHF (congestive heart failure)   Accelerated hypertension   1. NSTEMI (non-ST elevated myocardial infarction)  Patient is presenting with complaints of chest pain as well as progressively worsening shortness of breath. He is EKG did not show any STEMI. He is a highly elevated troponin. Repeat troponin is not significantly elevated, suggesting possible mild ischemia as initial etiology. Cardiology has recommended to start the patient heparin We'll continue to closely monitor since he has developed renal hematoma in the past. Continue aspirin.  2. Acute on chronic diastolic dysfunction. ESRD on hemodialysis. Discussed with Dr. Lorrene Reid, from nephrology recommends the patient to be given Kayexalate and at the earliest dialysis timing will be 7:00 in the morning. Will use Imdur and hydralazine for visit artifact.  3. Axert hypertension. IV Lopressor 1. Coreg continuous. Add hydralazine as well as Imdur.  4. Possible healthcare related pneumonia with pulmonary fibrosis. Discussed with the critical care, diffuse pulmonary fibrosis component is very mild. At present the  patient appears to be having cough with expectoration as well as leukocytosis. We will treat him with broad-spectrum antibiotics for healthcare associated pneumonia vancomycin and Zosyn. Follow cultures.  5. Anxiety. Currently the patient appears to be significantly anxious although the treatment options are limited due to critical condition. We'll continue to closely monitor.  Advance goals of care discussion: Full code as per my discussion with patient's family   Consults: Phone consultation with critical care as well as nephrology as well consultation with cardiology  DVT Prophylaxis:  therapeutic anticoagulation. Nutrition: Nothing by mouth except medication  Family Communication: family was present at bedside, opportunity was given to ask question and all questions were answered satisfactorily at the time of interview. Disposition: Admitted as inpatient, step-down unit.  Author: Berle Mull, MD [facility] Pager: [phone] 04/18/2015  If 7PM-7AM, please contact night-coverage [email or web address] Password [email or web address]

## 2015-04-19 ENCOUNTER — Other Ambulatory Visit (HOSPITAL_COMMUNITY): Payer: Medicare Other

## 2015-04-19 ENCOUNTER — Encounter (HOSPITAL_COMMUNITY): Payer: Self-pay | Admitting: Internal Medicine

## 2015-04-19 DIAGNOSIS — E43 Unspecified severe protein-calorie malnutrition: Secondary | ICD-10-CM | POA: Diagnosis present

## 2015-04-19 DIAGNOSIS — I10 Essential (primary) hypertension: Secondary | ICD-10-CM | POA: Diagnosis present

## 2015-04-19 LAB — LACTIC ACID, PLASMA
Lactic Acid, Venous: 7.2 mmol/L (ref 0.5–2.0)
Lactic Acid, Venous: 2.9 mmol/L (ref 0.5–2.0)

## 2015-04-19 LAB — POCT I-STAT 3, ART BLOOD GAS (G3+)
Bicarbonate: 22.2 mEq/L (ref 20.0–24.0)
O2 Saturation: 84 %
PCO2 ART: 28.3 mmHg — AB (ref 35.0–45.0)
Patient temperature: 97
TCO2: 23 mmol/L (ref 0–100)
pH, Arterial: 7.499 — ABNORMAL HIGH (ref 7.350–7.450)
pO2, Arterial: 41 mmHg — ABNORMAL LOW (ref 80.0–100.0)

## 2015-04-19 LAB — GLUCOSE, CAPILLARY
Glucose-Capillary: 49 mg/dL — ABNORMAL LOW (ref 65–99)
Glucose-Capillary: 98 mg/dL (ref 65–99)

## 2015-04-19 LAB — COMPREHENSIVE METABOLIC PANEL
ALBUMIN: 2.4 g/dL — AB (ref 3.5–5.0)
ALK PHOS: 111 U/L (ref 38–126)
ALT: 31 U/L (ref 17–63)
ANION GAP: 22 — AB (ref 5–15)
AST: 107 U/L — AB (ref 15–41)
BILIRUBIN TOTAL: 1.7 mg/dL — AB (ref 0.3–1.2)
BUN: 18 mg/dL (ref 6–20)
CO2: 17 mmol/L — ABNORMAL LOW (ref 22–32)
Calcium: 8.4 mg/dL — ABNORMAL LOW (ref 8.9–10.3)
Chloride: 96 mmol/L — ABNORMAL LOW (ref 101–111)
Creatinine, Ser: 4.98 mg/dL — ABNORMAL HIGH (ref 0.61–1.24)
GFR calc Af Amer: 12 mL/min — ABNORMAL LOW (ref >60)
GFR calc non Af Amer: 10 mL/min — ABNORMAL LOW (ref >60)
Glucose, Bld: 102 mg/dL — ABNORMAL HIGH (ref 65–99)
Sodium: 135 mmol/L (ref 135–145)
Total Protein: 7 g/dL (ref 6.5–8.1)

## 2015-04-19 LAB — PROTIME-INR
INR: 1.15 (ref 0.00–1.49)
PROTHROMBIN TIME: 14.9 s (ref 11.6–15.2)

## 2015-04-19 LAB — CBC
HEMATOCRIT: 39 % (ref 39.0–52.0)
Hemoglobin: 12.6 g/dL — ABNORMAL LOW (ref 13.0–17.0)
MCH: 28.6 pg (ref 26.0–34.0)
MCHC: 32.3 g/dL (ref 30.0–36.0)
MCV: 88.6 fL (ref 78.0–100.0)
Platelets: 138 10*3/uL — ABNORMAL LOW (ref 150–400)
RBC: 4.4 MIL/uL (ref 4.22–5.81)
RDW: 20.3 % — ABNORMAL HIGH (ref 11.5–15.5)
WBC: 13 10*3/uL — ABNORMAL HIGH (ref 4.0–10.5)

## 2015-04-19 LAB — BASIC METABOLIC PANEL
Anion gap: 16 — ABNORMAL HIGH (ref 5–15)
BUN: 22 mg/dL — ABNORMAL HIGH (ref 6–20)
CHLORIDE: 97 mmol/L — AB (ref 101–111)
CO2: 22 mmol/L (ref 22–32)
CREATININE: 5.29 mg/dL — AB (ref 0.61–1.24)
Calcium: 8.3 mg/dL — ABNORMAL LOW (ref 8.9–10.3)
GFR calc Af Amer: 11 mL/min — ABNORMAL LOW (ref >60)
GFR calc non Af Amer: 10 mL/min — ABNORMAL LOW (ref >60)
Glucose, Bld: 105 mg/dL — ABNORMAL HIGH (ref 65–99)
Potassium: 4.8 mmol/L (ref 3.5–5.1)
SODIUM: 135 mmol/L (ref 135–145)

## 2015-04-19 LAB — MRSA PCR SCREENING: MRSA BY PCR: NEGATIVE

## 2015-04-19 LAB — APTT: aPTT: 42 seconds — ABNORMAL HIGH (ref 24–37)

## 2015-04-19 LAB — HEPARIN LEVEL (UNFRACTIONATED)
HEPARIN UNFRACTIONATED: 0.15 [IU]/mL — AB (ref 0.30–0.70)
Heparin Unfractionated: <0.1 IU/mL — ABNORMAL LOW (ref 0.30–0.70)

## 2015-04-19 MED ORDER — DEXTROSE 50 % IV SOLN: 1.0000 | Freq: Once | INTRAVENOUS | Status: DC

## 2015-04-19 MED ORDER — HYDRALAZINE HCL 25 MG PO TABS
25.0000 mg | ORAL_TABLET | Freq: Four times a day (QID) | ORAL | Status: DC
  Administered 2015-04-19 – 2015-04-22 (×17): 25 mg via ORAL
  Filled 2015-04-19 (×22): qty 1

## 2015-04-19 MED ORDER — VANCOMYCIN HCL IN DEXTROSE 750-5 MG/150ML-% IV SOLN
750.0000 mg | INTRAVENOUS | Status: DC
  Administered 2015-04-19 – 2015-04-21 (×2): 750 mg via INTRAVENOUS
  Filled 2015-04-19 (×2): qty 150

## 2015-04-19 MED ORDER — HYDRALAZINE HCL 20 MG/ML IJ SOLN: 10.0000 mg | INTRAMUSCULAR | Status: DC | PRN

## 2015-04-19 MED ORDER — SODIUM POLYSTYRENE SULFONATE 15 GM/60ML PO SUSP: 15.0000 g | Freq: Once | ORAL | Status: DC

## 2015-04-19 MED ORDER — PRO-STAT SUGAR FREE PO LIQD
30.0000 mL | Freq: Two times a day (BID) | ORAL | Status: DC
  Administered 2015-04-19 – 2015-04-25 (×13): 30 mL via ORAL
  Filled 2015-04-19 (×16): qty 30

## 2015-04-19 MED ORDER — NEPRO/CARBSTEADY PO LIQD
237.0000 mL | Freq: Two times a day (BID) | ORAL | Status: DC
  Administered 2015-04-19: 22:00:00 via ORAL
  Administered 2015-04-19 – 2015-04-25 (×12): 237 mL via ORAL
  Filled 2015-04-19 (×17): qty 237

## 2015-04-19 MED ORDER — ISOSORBIDE MONONITRATE ER 30 MG PO TB24
30.0000 mg | ORAL_TABLET | Freq: Every day | ORAL | Status: DC
  Administered 2015-04-19 – 2015-04-22 (×4): 30 mg via ORAL
  Filled 2015-04-19 (×5): qty 1

## 2015-04-19 MED ORDER — INSULIN ASPART 100 UNIT/ML IV SOLN: 10.0000 [IU] | Freq: Once | INTRAVENOUS | Status: DC

## 2015-04-19 MED ORDER — VANCOMYCIN HCL 1000 MG IV SOLR
750.0000 mg | Freq: Once | INTRAVENOUS | Status: AC
  Administered 2015-04-19: 750 mg via INTRAVENOUS
  Filled 2015-04-19: qty 750

## 2015-04-19 MED ORDER — SODIUM POLYSTYRENE SULFONATE 15 GM/60ML PO SUSP: 30.0000 g | Freq: Once | ORAL | Status: DC

## 2015-04-19 NOTE — Procedures (Signed)
I have personally attended this patient's dialysis session.   Initiating HD on 2K bath pending repeat K with fluid goal of 2-2,5 liters as BP tolerates Access right AVG Pt on BIPAP Tachy 120's and may require IV metoprolol during HD  Jamal Maes, MD [place] Pager 04/19/2015, 8:20 AM

## 2015-04-19 NOTE — Progress Notes (Signed)
Initial Nutrition Assessment  DOCUMENTATION CODES:  Severe malnutrition in context of chronic illness  INTERVENTION:  Recommend Re-weighing patient in light of documented extreme weight loss  Once diet advanced: Nepro Shake po BID, each supplement provides 425 kcal and 19 grams protein  Prostat BID, each supplement provides 100 kcal, 15 g Pro  Continue Renal Vitamin once labs stabilized.   NUTRITION DIAGNOSIS:  Unintentional weight loss related to fluid removal/dialysis as evidenced by apparent loss of >30 lbs in the last few days  GOAL:  Pt will meet greater than or equal to 90% of their needs  MONITOR:  PO intake, Supplement acceptance, Diet advancement, Labs, Skin, I & O's, Weight trends  REASON FOR ASSESSMENT:  Malnutrition Screening Tool    ASSESSMENT:  74 y.o. male with Past medical history of ESRD on hemodialysis, CHF, GERD, pulmonary fibrosis, Sjogren's disease, chronic diastolic dysfunction, hypertension, . Presents with progressively worsening SOB since last 2 days. Treated for NSTEMI.  Patient was at emergent dialysis session when I spoke to him.  hard to communicate with d/t NRB and patient having very labored breathing. He was also extremely anxious and kept asking about when he could go back down to room. Difficult to focus on discussion.   Pt states that he has been eating well, but has still been losing weight. He takes a renal mvi at home. He was hesitant when I asked if he was following a renal diet, suspect he is not compliant with it though he did say his wife prepares his meals. He does endorse nausea w/o vomiting as well as episodes of constipation and diarrhea.  He states that his normal weight is 177 lbs or 80 kg. His D/C summary from his admission on 6/8 states his dry weight is 89 kg It appears like he dropped a very significant amount of weight in the past days. Pt has issues with persistent volume overload and edema. From what I could understand, he  believes his weight loss is associated with fluid in his legs though I could not locate documentation of significant fluid removal  NFPE: Patient has very dry skin. He has severe upper body muscle/fat wasting.   Diet Order:  Diet NPO time specified Except for: Sips with Meds, Ice Chips  Skin:  Dry  Last BM:  WDL  Height:  Ht Readings from Last 1 Encounters:  01/29/15 5\' 10"  (1.778 m)   Weight:  Wt Readings from Last 1 Encounters:  04/19/15 163 lb 12.8 oz (74.3 kg)   Wt Readings from Last 10 Encounters:  04/19/15 163 lb 12.8 oz (74.3 kg)  02/19/15 200 lb 2.8 oz (90.8 kg)  01/28/15 216 lb (97.977 kg)  01/06/15 216 lb (97.977 kg)  01/02/15 220 lb 1.6 oz (99.837 kg)  12/26/14 218 lb 8 oz (99.111 kg)   Ideal Body Weight:  75.5 kg  BMI:  Body mass index is 23.5 kg/(m^2).   Estimated Nutritional Needs:  Kcal:  1850-2000 kcal/kg (25-27 kcal/kg) Protein:  89-104 g Pro (1.2-1.4 g/kg) Fluid:  Per MD  EDUCATION NEEDS:  Education needs no appropriate at this time  Burtis Junes RD, LDN Nutrition Pager: [phone] 04/19/2015 12:13 PM;

## 2015-04-19 NOTE — Progress Notes (Signed)
[place] for :  Heparin infusion;  Vancomycin, Zosyn Indication:  NSTEMI ; R/O HCAP  Hospital Problems: Principal Problem:   NSTEMI (non-ST elevated myocardial infarction) Active Problems:   Pulmonary fibrosis   HCAP (healthcare-associated pneumonia)   Sjogren's disease   DVT (deep venous thrombosis)   ESRD on dialysis   Acute on chronic diastolic CHF (congestive heart failure)   Accelerated hypertension   CURRENTLY: Heparin Dosing Weight: 72.2 kg 98.3 F (36.8 C) (Oral) Lab Results  Component Value Date   WBC 15.1* 04/18/2015    LABS:  Recent Labs Lab 04/18/15 1625 04/19/15 0030 04/19/15 1003 04/19/15 1005  NA 136 135  --  135  K 5.7* >7.5*  --  4.8  CL 96* 96*  --  97*  CO2 29 17*  --  22  GLUCOSE 117* 102*  --  105*  BUN 11 18  --  22*  CREATININE 4.32* 4.98*  --  5.29*  CALCIUM 8.3* 8.4*  --  8.3*  NEUTROABS 13.1*  --   --   --   HGB 14.0  --   --   --   HCT 44.4  --   --   --   MCV 91.4  --   --   --   PLT 119*  --   --   --   INR  --  1.15  --   --   HEPARINUNFRC  --   --  <0.10*  --     CrCl: Estimated Creatinine Clearance: 12.8 mL/min (by C-G formula based on Cr of 5.29).  MICROBIOLOGY: Lab Results  Component Value Date   CULT  01/06/2015    Multiple bacterial morphotypes present, none predominant. Suggest appropriate recollection if clinically indicated. Performed at [facility]  01/06/2015    NO GROWTH 5 DAYS Performed at [facility]  01/06/2015    NO GROWTH 5 DAYS Performed at [place]  12/23/2014    NO GROWTH 5 DAYS Performed at [facility]  12/23/2014    NO GROWTH 5 DAYS Performed at [facility]    No results for input(s): CULT, SDES in the last 168 hours.  MEDICATIONS: Scheduled:  Scheduled:  . atorvastatin  40 mg Oral q1800  . carvedilol  6.25 mg Oral BID WC  . dextrose  1 ampule Intravenous Once  .  guaiFENesin  600 mg Oral BID  . hydrALAZINE  25 mg Oral 4 times per day  . insulin aspart  10 Units Intravenous Once  . isosorbide mononitrate  30 mg Oral Daily  . pantoprazole  40 mg Oral Daily  . piperacillin-tazobactam (ZOSYN)  IV  2.25 g Intravenous 3 times per day  . sodium chloride  3 mL Intravenous Q12H  . sodium polystyrene  30 g Oral Once  . vancomycin (VANCOCIN) 750 mg IVPB (Hemodialysis)  750 mg Intravenous Once  . [START ON 04/21/2015] vancomycin  750 mg Intravenous Q M,W,F-HD   Infusion[s]: Infusions:  . heparin 1,050 Units/hr (04/19/15 0000)   Antibiotic[s]: Anti-infectives    Start     Dose/Rate Route Frequency Ordered Stop   04/21/15 1200  vancomycin (VANCOCIN) IVPB 1000 mg/200 mL premix  Status:  Discontinued     1,000 mg 200 mL/hr over 60 Minutes Intravenous Every M-W-F (Hemodialysis) 04/18/15 2318 04/19/15 0833   04/21/15 1200  vancomycin (VANCOCIN) IVPB 750  mg/150 ml premix     750 mg 150 mL/hr over 60 Minutes Intravenous Every M-W-F (Hemodialysis) 04/19/15 0833     04/19/15 1200  vancomycin (VANCOCIN) 750 mg in sodium chloride 0.9 % 150 mL IVPB     750 mg 150 mL/hr over 60 Minutes Intravenous  Once 04/19/15 1158     04/18/15 2330  piperacillin-tazobactam (ZOSYN) IVPB 2.25 g     2.25 g 100 mL/hr over 30 Minutes Intravenous 3 times per day 04/18/15 2318     04/18/15 2330  vancomycin (VANCOCIN) 2,000 mg in sodium chloride 0.9 % 500 mL IVPB     2,000 mg 250 mL/hr over 120 Minutes Intravenous  Once 04/18/15 2318 04/19/15 0254      ASSESSMENT: 74 y.o. male with ESRD on HD admitted with NSTEMI , HCAP who is on Heparin infusion and Vanc/Zosyn per Pharmacy management.  Heparin rate 1050 units/hr.  Pre-HD Heparin level < 0.1 units/ml.  No evidence of bleeding complications noted   Patient normally on HD MWF.  He is receiving an extra HD session today.  GOAL:    Heparin Level  0.3 - 0.7 units/m  Vancomycin Pre-Dialysis level 15 - 25 mcg/ml  Zosyn dosed for  clinical indication and adjusted for renal function .  PLAN: 1. Increase Heparin infusion to 1300 units/hr.  The next Heparin Level will be in 8 hours    2. Vancomycin 750 mg IV at end of HD session today. 3. Follow up cultures, clinical course, HD schedule, Vancomycin levels as clinically indicated, length of therapy, and antibiotic dosing schedules adjusted as indicated. 4. Daily Heparin level, CBC while on Heparin.  Monitor for bleeding complications. Follow Platelet counts.  Marthenia Rolling,  Pharm.D   04/19/2015,  11:58 AM

## 2015-04-19 NOTE — Progress Notes (Signed)
RT transported pt to dialysis with RN. Pt remained stable through the trip. RN will call RT when dialysis is over.

## 2015-04-19 NOTE — Progress Notes (Signed)
CRITICAL VALUE ALERT  Critical value received:  Lactic Acid 7.2  Date of notification:  04/19/15  Time of notification:  0233  Critical value read back:Yes.    Nurse who received alert:  [place], RN  MD notified (1st page):  M. Donnal Debar, NP  Time of first page:  0240  MD notified (2nd page):  Time of second page:  Responding MD:  M. Donnal Debar, NP  Time MD responded:  [phone]

## 2015-04-19 NOTE — Progress Notes (Signed)
ANTICOAGULATION CONSULT NOTE - Follow Up Consult  Pharmacy Consult for Heparin  Indication: NSTEMI  No Known Allergies  Patient Measurements: Height: 5\' 7"  (170.2 cm) Weight: 163 lb 12.8 oz (74.3 kg) IBW/kg (Calculated) : 66.1  Vital Signs: Temp: 98.4 F (36.9 C) (08/06 2000) Temp Source: Oral (08/06 2000) BP: 141/83 mmHg (08/06 2208) Pulse Rate: 93 (08/06 2208)  Labs:  Recent Labs  04/18/15 1625 04/18/15 2204 04/19/15 0030 04/19/15 1003 04/19/15 1005 04/19/15 2220  HGB 14.0  --   --  12.6*  --   --   HCT 44.4  --   --  39.0  --   --   PLT 119*  --   --  138*  --   --   APTT  --   --   --  42*  --   --   LABPROT  --   --  14.9  --   --   --   INR  --   --  1.15  --   --   --   HEPARINUNFRC  --   --   --  <0.10*  --  0.15*  CREATININE 4.32*  --  4.98*  --  5.29*  --   TROPONINI  --  0.56*  --   --   --   --     Estimated Creatinine Clearance: 11.6 mL/min (by C-G formula based on Cr of 5.29).   Assessment: Sub-therapeutic heparin level despite rate increase  Goal of Therapy:  Heparin level 0.3-0.7 units/ml Monitor platelets by anticoagulation protocol: Yes   Plan:  -Increase heparin to 1500 units/hr -Check next level with AM labs, try to coordinate HL with other labs as pt is HARD STICK -Daily CBC/HL -Monitor for bleeding  Narda Bonds 04/19/2015,11:28 PM

## 2015-04-19 NOTE — Progress Notes (Addendum)
[facility] PROGRESS NOTE  Travis [name] [id number] DOB: April 12, 1941 DOA: 04/18/2015 PCP: [name], MD  Assessment/Plan: Principal Problem:   NSTEMI (non-ST elevated myocardial infarction) Active Problems:   Pulmonary fibrosis   HCAP (healthcare-associated pneumonia)   Sjogren's disease   DVT (deep venous thrombosis)   ESRD on dialysis   Acute on chronic diastolic CHF (congestive heart failure)   Accelerated hypertension     1. NSTEMI (non-ST elevated myocardial infarction) Patient is presenting with complaints of chest pain as well as progressively worsening shortness of breath likely secondary to acute on chronic heart failure. Troponin peaked at 0.68 Patient is currently on a heparin drip. Cardiology consultation completed. Continue aspirin. Patient had a 2-D echo on 8/5, results pending.If significant Troponin elevation and abnormal echo (prior showed normal LVEF with normal wall motion) we might consider cath once he improves clinically    2. Acute on chronic diastolic heart failure 2-2 1/2 L via hemodialysis  Continue  Imdur and hydralazine for visit artifact.  3. Accelerated hypertension.  Continue Coreg, by mouth hydralazine, imdur,  4. Possible healthcare related pneumonia with pulmonary fibrosis. Discussed with the critical care, diffuse pulmonary fibrosis component is very mild. At present the patient appears to be having cough with expectoration as well as leukocytosis. Continue vancomycin and Zosyn  for healthcare associated pneumonia vancomycin and Zosyn. Follow cultures.  5. Anxiety. Currently the patient appears to be significantly anxious although the treatment options are limited due to critical condition. We'll continue to closely monitor.  6. 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 Evidence for persistent volume overload/interstitial edema..  Urgent hemodialysis today as potassium greater than 7.5. Repeat BMP today  7. 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 Other problems include history of right DVT with prior IVC filter, Sjogrens, hypertension, past admissions for PNA and notes indicate pulmonary fibrosis as well. He is on dialysis MWF at [facility]. He went to dialysis yesterday for his regular treatment, which was aborted after 2 hours and 40 minutes due to progressive dyspnea and tachycardia (and did not therefore achieve his EDW of 75 - got to 76.2) .   He was seen in the ED for progressive dyspnea, was hypoxemia with O2sats of 89%, was tachycardic (115-120) and hypertensive (160's/100's). Was also complaining of a rash of about 10 days duration. Chest x ray showed vascular congestion. Troponin was elevated at 0.68. Had new anterolateral changes on ECG with question of new ischemia. Was felt to have acute diastolic CHF.   Lactic acid elevated at 7.2, WBC elevated at 15,000 and ATB's have been started.    Consultants:  Nephrology  Cardiology  Procedures:  None  Antibiotics: Anti-infectives    Start     Dose/Rate Route Frequency Ordered Stop   04/21/15 1200  vancomycin (VANCOCIN) IVPB 1000 mg/200 mL premix  Status:  Discontinued     1,000 mg 200 mL/hr over 60 Minutes Intravenous Every M-W-F (Hemodialysis) 04/18/15 2318 04/19/15 0833   04/21/15 1200  vancomycin (VANCOCIN) IVPB 750 mg/150 ml premix     750 mg 150 mL/hr over 60  Minutes Intravenous Every M-W-F (Hemodialysis) 04/19/15 0833     04/18/15 2330  piperacillin-tazobactam (ZOSYN) IVPB 2.25 g     2.25 g 100 mL/hr over 30 Minutes Intravenous 3 times per day 04/18/15 2318     04/18/15 2330  vancomycin (VANCOCIN) 2,000 mg in sodium chloride 0.9 % 500 mL IVPB     2,000 mg 250 mL/hr over 120 Minutes Intravenous  Once 04/18/15 2318 04/19/15 0254         HPI/Subjective: Still has some dyspnea and shortness of breath,  Objective: Filed Vitals:   04/19/15 0539 04/19/15 0647 04/19/15 0754 04/19/15 0820  BP: 160/99 172/115 165/114   Pulse:  113 111 110  Temp:   98.3 F (36.8 C)   TempSrc:   Oral   Resp:  29 27 34  Weight:      SpO2:  100% 100% 98%    Intake/Output Summary (Last 24 hours) at 04/19/15 0858 Last data filed at 04/19/15 0600  Gross per 24 hour  Intake 971.45 ml  Output      0 ml  Net 971.45 ml    Exam:  General: No acute respiratory distress Lungs: Clear to auscultation bilaterally without wheezes or crackles Cardiovascular: Regular rate and rhythm without murmur gallop or rub normal S1 and S2 Abdomen: Nontender, nondistended, soft, bowel sounds positive, no rebound, no ascites, no appreciable mass Extremities: No significant cyanosis, clubbing, or edema bilateral lower extremities     Data Review   Micro Results Recent Results (from the past 240 hour(s))  MRSA PCR Screening     Status: None   Collection Time: 04/18/15 10:19 PM  Result Value Ref Range Status   MRSA by PCR NEGATIVE NEGATIVE Final    Comment:        The GeneXpert MRSA Assay (FDA approved for NASAL specimens only), is one component of a comprehensive MRSA colonization surveillance program. It is not intended to diagnose MRSA infection nor to guide or monitor treatment for MRSA infections.     Radiology Reports Dg Chest Port 1 View  04/18/2015   CLINICAL DATA:  74 year old male with shortness of breath, tachycardia. Renal failure on dialysis for 3  weeks. Initial encounter.  EXAM: PORTABLE CHEST - 1 VIEW  COMPARISON:  02/17/2015 and earlier.  FINDINGS: Portable AP semi upright view at 1605 hr. Tunneled right chest dialysis catheter is no longer present. Interval decreased but not resolved pleural effusions, residual more apparent on the right. Continued pulmonary vascular congestion, but also improved. Stable cardiac size and mediastinal contours. No pneumothorax. No consolidation.  IMPRESSION: Improved but not resolved pulmonary edema and small pleural effusions since June.  Tunneled right chest dialysis catheter has been removed.   Electronically Signed   By: Genevie Ann M.D.   On: 04/18/2015 16:15     CBC  Recent Labs Lab 04/18/15 1625  WBC 15.1*  HGB 14.0  HCT 44.4  PLT 119*  MCV 91.4  MCH 28.8  MCHC 31.5  RDW 20.4*  LYMPHSABS 0.4*  MONOABS 1.6*  EOSABS 0.0  BASOSABS 0.1    Chemistries   Recent Labs Lab 04/18/15 1625 04/19/15 0030  NA 136 135  K 5.7* >7.5*  CL 96* 96*  CO2 29 17*  GLUCOSE 117* 102*  BUN 11 18  CREATININE 4.32* 4.98*  CALCIUM 8.3* 8.4*  AST  --  107*  ALT  --  31  ALKPHOS  --  111  BILITOT  --  1.7*   ------------------------------------------------------------------------------------------------------------------ estimated creatinine clearance is 13.6 mL/min (by C-G formula based on Cr of 4.98). ------------------------------------------------------------------------------------------------------------------ No results for input(s): HGBA1C in the last 72 hours. ------------------------------------------------------------------------------------------------------------------ No results for input(s): CHOL, HDL, LDLCALC, TRIG, CHOLHDL, LDLDIRECT in the last 72 hours. ------------------------------------------------------------------------------------------------------------------ No results for input(s): TSH, T4TOTAL, T3FREE, THYROIDAB in the last 72 hours.  Invalid input(s):  FREET3 ------------------------------------------------------------------------------------------------------------------ No results for input(s): VITAMINB12, FOLATE, FERRITIN, TIBC, IRON, RETICCTPCT in the last 72 hours.  Coagulation profile  Recent Labs Lab 04/19/15 0030  INR 1.15    No results for input(s): DDIMER in the last 72 hours.  Cardiac Enzymes  Recent Labs Lab 04/18/15 2204  TROPONINI 0.56*   ------------------------------------------------------------------------------------------------------------------ Invalid input(s): POCBNP   CBG:  Recent Labs Lab 04/18/15 2048  GLUCAP 98       Studies: Dg Chest Port 1 View  04/18/2015   CLINICAL DATA:  74 year old male with shortness of breath, tachycardia. Renal failure on dialysis for 3 weeks. Initial encounter.  EXAM: PORTABLE CHEST - 1 VIEW  COMPARISON:  02/17/2015 and earlier.  FINDINGS: Portable AP semi upright view at 1605 hr. Tunneled right chest dialysis catheter is no longer present. Interval decreased but not resolved pleural effusions, residual more apparent on the right. Continued pulmonary vascular congestion, but also improved. Stable cardiac size and mediastinal contours. No pneumothorax. No consolidation.  IMPRESSION: Improved but not resolved pulmonary edema and small pleural effusions since June.  Tunneled right chest dialysis catheter has been removed.   Electronically Signed   By: Genevie Ann M.D.   On: 04/18/2015 16:15      Lab Results  Component Value Date   HGBA1C 4.9 01/30/2015   HGBA1C 4.6* 01/29/2015   Lab Results  Component Value Date   CREATININE 4.98* 04/19/2015       Scheduled Meds: . atorvastatin  40 mg Oral q1800  . carvedilol  6.25 mg Oral BID WC  . dextrose  1 ampule Intravenous Once  . guaiFENesin  600 mg Oral BID  . hydrALAZINE  25 mg Oral 4 times per day  . insulin aspart  10 Units Intravenous Once  . isosorbide mononitrate  30 mg Oral Daily  . pantoprazole  40 mg Oral  Daily  . piperacillin-tazobactam (ZOSYN)  IV  2.25 g Intravenous 3 times per day  . sodium chloride  3 mL Intravenous Q12H  . sodium polystyrene  30 g Oral Once  . [START ON 04/21/2015] vancomycin  750 mg Intravenous Q M,W,F-HD   Continuous Infusions: . heparin 1,050 Units/hr (04/19/15 0000)    Principal Problem:   NSTEMI (non-ST elevated myocardial infarction) Active Problems:   Pulmonary fibrosis   HCAP (healthcare-associated pneumonia)   Sjogren's disease   DVT (deep venous thrombosis)   ESRD on dialysis   Acute on chronic diastolic CHF (congestive heart failure)   Accelerated hypertension    Time spent: 45 minutes   [facility] Pager [phone]. If 7PM-7AM, please contact night-coverage at [email or web address], password [facility] 04/19/2015, 8:58 AM  LOS: 1 day

## 2015-04-19 NOTE — Consult Note (Addendum)
[facility] Renal Consultation Note Requesting Physician:  Dr. Marlowe Sax Reason for Consult:  Provision of dialysis and management of ESRD related issues HPI: The patient is a 74 y.o. 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 Other problems include history of right DVT with prior IVC filter, Sjogrens, hypertension, past admissions for PNA and notes indicate pulmonary fibrosis as well. He is on dialysis MWF at [facility]. He went to dialysis yesterday for his regular treatment, which was aborted after 2 hours and 40 minutes due to progressive dyspnea and tachycardia  (and did not therefore achieve his EDW of 75  - got to 76.2) .   He was seen in the ED for progressive dyspnea, was hypoxemia with O2sats of 89%, was tachycardic (115-120) and hypertensive (160's/100's). Was also complaining of a rash of about 10 days duration. Chest x ray showed vascular congestion. Troponin was elevated at 0.68. Had new anterolateral changes on ECG with question of new ischemia. Was felt to have acute diastolic CHF.   Lactic acid elevated at 7.2, WBC elevated at 15,000 and ATB's have been started.   We are asked to see to provide additional dialysis for volume removal and correction of electrolyte issues. His potassium came back at >7.5 this AM but specimen was hemolyzed. He is to be dialyzed within the hour.      Past Medical History  Diagnosis Date  . Medical history non-contributory   . Arthritis   . Pneumonia   . GERD (gastroesophageal reflux disease)   . Sjogren's disease 01/28/2015  . Pulmonary fibrosis   . Renal disorder   . Acute on chronic diastolic CHF (congestive heart failure) 04/18/2015  . Accelerated hypertension 04/19/2015    Past Surgical History  Procedure Laterality Date  . Hemorroidectomy  1999  . Surgical procedure to  remove a mole as a child Right Eye area    At around 47 years old  . Insertion of dialysis catheter N/A 02/17/2015    Procedure: INSERTION OF DIALYSIS CATHETER RIGHT INTERNAL JUGULAR VEIN;  Surgeon: Elam Dutch, MD;  Location: [facility];  Service: Vascular;  Laterality: N/A;  . Av fistula placement Right 02/17/2015    Procedure: INSERTION OF RIGHT ARM  ARTERIOVENOUS (AV) GORE-TEX GRAFT ;  Surgeon: Elam Dutch, MD;  Location: [facility] OR;  Service: Vascular;  Laterality: Right;    Family History  Problem Relation Age of Onset  . Hypertension Mother    Social History:  reports that he has never smoked. He has never used smokeless tobacco. He reports that he does not drink alcohol or use illicit drugs.  Allergies: No Known Allergies  Home medications: Prior to Admission medications   Medication Sig Start Date End Date Taking? Authorizing Provider  cyanocobalamin 500 MCG tablet Take 1 tablet (500 mcg total) by mouth daily. 12/26/14  Yes Donne Hazel, MD  hydrOXYzine (ATARAX/VISTARIL) 10 MG tablet Take 1 tablet (10 mg total) by mouth 3 (three) times daily as needed. 02/19/15  Yes Kelvin Cellar, MD  multivitamin (RENA-VIT) TABS tablet Take 1 tablet by mouth daily.   Yes Historical Provider, MD  pantoprazole (PROTONIX) 40 MG tablet Take 1 tablet (40 mg total) by mouth daily. 01/31/15  Yes Barton Dubois, MD  triamcinolone cream (KENALOG) 0.1 % Apply 1 application topically 2 (two) times daily.   Yes Historical Provider, MD  white  petrolatum (VASELINE) GEL Apply 1 application topically as needed for dry skin.   Yes Historical Provider, MD  ferrous sulfate 325 (65 FE) MG tablet Take 1 tablet (325 mg total) by mouth 2 (two) times daily with a meal. 12/26/14   Donne Hazel, MD  furosemide (LASIX) 40 MG tablet Take 1 tablet (40 mg total) by mouth daily. 01/28/15   Brand Males, MD  traMADol-acetaminophen (ULTRACET) 37.5-325 MG per tablet Take 1-2 tablets by mouth every 4 (four) hours as needed for  severe pain. 12/26/14   Donne Hazel, MD    Inpatient medications: . atorvastatin  40 mg Oral q1800  . carvedilol  6.25 mg Oral BID WC  . dextrose  1 ampule Intravenous Once  . guaiFENesin  600 mg Oral BID  . hydrALAZINE  25 mg Oral 4 times per day  . insulin aspart  10 Units Intravenous Once  . isosorbide mononitrate  30 mg Oral Daily  . pantoprazole  40 mg Oral Daily  . piperacillin-tazobactam (ZOSYN)  IV  2.25 g Intravenous 3 times per day  . sodium chloride  3 mL Intravenous Q12H  . sodium polystyrene  30 g Oral Once  . [START ON 04/21/2015] vancomycin  1,000 mg Intravenous Q M,W,F-HD    Review of Systems Currently difficult to obtain as pt is on BIPAP  Physical Exam:  BP 172/115 mmHg  Pulse 113  Temp(Src) 97 F (36.1 C) (Axillary)  Resp 29  Wt 73.2 kg (161 lb 6 oz)  SpO2 100%  Gen: Small framed AAM, on BIPAP and appears reasonably comfortable at this moment Tachy at 110 BP elevated as noted Skin: Dry and scaly Neck: Difficult to assess neck veins Chest Bilateral crackles Heart: Regular tachycardia S1S2 No S3 Cannot appreciate murmurs Abdomen: soft, no focal tenderness Ext: 1+ edema bilaterally Neuro: alert, Ox3, no focal deficit  Labs: Basic Metabolic Panel:  Recent Labs Lab 04/18/15 1625 04/19/15 0030  NA 136 135  K 5.7* >7.5*  CL 96* 96*  CO2 29 17*  GLUCOSE 117* 102*  BUN 11 18  CREATININE 4.32* 4.98*  CALCIUM 8.3* 8.4*   Liver Function Tests:  Recent Labs Lab 04/19/15 0030  AST 107*  ALT 31  ALKPHOS 111  BILITOT 1.7*  PROT 7.0  ALBUMIN 2.4*  CBC:  Recent Labs Lab 04/18/15 1625  WBC 15.1*  NEUTROABS 13.1*  HGB 14.0  HCT 44.4  MCV 91.4  PLT 119*     Recent Labs Lab 04/18/15 2204  TROPONINI 0.56*   CBG:  Recent Labs Lab 04/18/15 2048  GLUCAP 98    Iron Studies: No results for input(s): IRON, TIBC, TRANSFERRIN, FERRITIN in the last 168 hours.  Xrays/Other Studies: Dg Chest Port 1 View  04/18/2015   CLINICAL DATA:   74 year old male with shortness of breath, tachycardia. Renal failure on dialysis for 3 weeks. Initial encounter.  EXAM: PORTABLE CHEST - 1 VIEW  COMPARISON:  02/17/2015 and earlier.  FINDINGS: Portable AP semi upright view at 1605 hr. Tunneled right chest dialysis catheter is no longer present. Interval decreased but not resolved pleural effusions, residual more apparent on the right. Continued pulmonary vascular congestion, but also improved. Stable cardiac size and mediastinal contours. No pneumothorax. No consolidation.  IMPRESSION: Improved but not resolved pulmonary edema and small pleural effusions since June.  Tunneled right chest dialysis catheter has been removed.   Electronically Signed   By: Genevie Ann M.D.   On: 04/18/2015 16:15   Dialysis Prescription MWF  [facility] 4 hours 3K2Ca Right AVG EDW 75 kg Aranesp 150/week Venofer 50 /week HD temp 36 No calcitriol  Background: 74 y.o. 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 Other problems include history of right DVT with prior IVC filter, Sjogrens, hypertension, past admission for PNA and notes indicate pulmonary fibrosis as well. He is on dialysis MWF at [facility]. He went to dialysis yesterday for his regular treatment, which was aborted after 2 hours and 40 minutes due to progressive dyspnea and tachycardia  (and did not therefore achieve his EDW of 75) . He was seen in the ED for progressive dyspnea, was hypoxemia with O2sats of 89%, was tachycardic (115-120) and hypertensive (160's/100's). Was also complaining of a rash of about 10 days duration. Chest x ray showed vascular congestion. Troponin was elevated at 0.68. Was felt to have acute diastolic CHF. Lactic acid elevated at 7.2, WBC elevated at 15,000 and ATB's have been started. We are asked to see to provide additional dialysis for  volume removal and correction of electrolyte issues.  Impression/Plan 1. 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 Evidence for persistent volume overload/interstitial edema. K elevated but not sure just how high as specimen hemolyzed. Pt to have HD urgently at this time for volume removal. With ongoing tachycardia may require IV metoprolol during TMT. Repeat K pre HD. 2. Leukocytosis, elevated lactate - r/o sepsis. Blood cultures. Already on vanco and zosyn. Repeat lactic acid 3. Elevated troponin, anterolateral ECG changes new - cardiology evaluating for ischemia. May need heart cath. On IV heparin/ASA/statin 4. Acute diastolic CHF - for repeat dialysis and volume removal.  5. HTN - BB 6. H/o DVT s/p IVC filter 02/2015 - not on anticoagulation as outpt. 7. Anemia - on outpt Aranesp 150 mcg qweek last given 8/2. Current Hb 14 - hold. 8. 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 Not on VDRA or cincacalcet as PTH in goal range of 150-300  Jamal Maes,  MD [facility] [phone] pager 04/19/2015, 7:46 AM

## 2015-04-19 NOTE — Progress Notes (Signed)
RT was called by [facility] dialysis RN and notified that pt was refusing to wear his bi-pap mask. RT instructed RN to place NRB on pt and check his sat. RT called back shortly after to check on pt and his sat is 100 and he is doing well with the NRB. RT will continue to monitor.

## 2015-04-19 NOTE — Progress Notes (Signed)
CRITICAL VALUE ALERT  Critical value received:  Troponin 0.56  Date of notification:  04/18/15  Time of notification:  2204  Critical value read back:Yes.    Nurse who received alert:  [place], RN  MD notified (1st page):  Dr. Posey Pronto  Time of first page:  2205, MD present at bedside when notification recieved  MD notified (2nd page):  Time of second page:  Responding MD:  Dr. Posey Pronto  Time MD responded:  2205  No new orders given.

## 2015-04-19 NOTE — Progress Notes (Signed)
Patient is resting comfortably on nasal cannula. Bipap not placed on at this time. RT will continue to monitor. RN aware.

## 2015-04-19 NOTE — Progress Notes (Signed)
CRITICAL VALUE ALERT  Critical value received:  1100  Date of notification: 04/19/2015  Time of notification:  1103  Critical value read back:yes   Nurse who received alert:  Kennon Rounds  MD notified (1st page):  1103  Time of first page:  1105  MD notified (2nd page):  Time of second page:  Responding MD:  Dr. Para March  Time MD responded:  *1105

## 2015-04-20 ENCOUNTER — Inpatient Hospital Stay (HOSPITAL_COMMUNITY): Payer: Medicare Other

## 2015-04-20 DIAGNOSIS — I5033 Acute on chronic diastolic (congestive) heart failure: Secondary | ICD-10-CM

## 2015-04-20 DIAGNOSIS — Z992 Dependence on renal dialysis: Secondary | ICD-10-CM

## 2015-04-20 DIAGNOSIS — R079 Chest pain, unspecified: Secondary | ICD-10-CM

## 2015-04-20 DIAGNOSIS — R7989 Other specified abnormal findings of blood chemistry: Secondary | ICD-10-CM

## 2015-04-20 LAB — COMPREHENSIVE METABOLIC PANEL
ALK PHOS: 115 U/L (ref 38–126)
ALT: 28 U/L (ref 17–63)
ANION GAP: 16 — AB (ref 5–15)
AST: 50 U/L — AB (ref 15–41)
Albumin: 2.2 g/dL — ABNORMAL LOW (ref 3.5–5.0)
BUN: 23 mg/dL — ABNORMAL HIGH (ref 6–20)
CHLORIDE: 94 mmol/L — AB (ref 101–111)
CO2: 28 mmol/L (ref 22–32)
CREATININE: 4.43 mg/dL — AB (ref 0.61–1.24)
Calcium: 8.6 mg/dL — ABNORMAL LOW (ref 8.9–10.3)
GFR calc non Af Amer: 12 mL/min — ABNORMAL LOW (ref >60)
GFR, EST AFRICAN AMERICAN: 14 mL/min — AB (ref >60)
GLUCOSE: 91 mg/dL (ref 65–99)
Potassium: 4.8 mmol/L (ref 3.5–5.1)
Sodium: 138 mmol/L (ref 135–145)
Total Bilirubin: 0.6 mg/dL (ref 0.3–1.2)
Total Protein: 6.9 g/dL (ref 6.5–8.1)

## 2015-04-20 LAB — CBC
HEMATOCRIT: 44.1 % (ref 39.0–52.0)
HEMOGLOBIN: 13.8 g/dL (ref 13.0–17.0)
MCH: 28.9 pg (ref 26.0–34.0)
MCHC: 31.3 g/dL (ref 30.0–36.0)
MCV: 92.3 fL (ref 78.0–100.0)
Platelets: 147 10*3/uL — ABNORMAL LOW (ref 150–400)
RBC: 4.78 MIL/uL (ref 4.22–5.81)
RDW: 21.3 % — AB (ref 11.5–15.5)
WBC: 14.6 10*3/uL — AB (ref 4.0–10.5)

## 2015-04-20 LAB — C DIFFICILE QUICK SCREEN W PCR REFLEX
C DIFFICILE (CDIFF) INTERP: NEGATIVE
C Diff antigen: NEGATIVE
C Diff toxin: NEGATIVE

## 2015-04-20 LAB — HEPARIN LEVEL (UNFRACTIONATED)
HEPARIN UNFRACTIONATED: 0.2 [IU]/mL — AB (ref 0.30–0.70)
HEPARIN UNFRACTIONATED: 0.1 [IU]/mL — AB (ref 0.30–0.70)

## 2015-04-20 MED ORDER — HEPARIN BOLUS VIA INFUSION
1500.0000 [IU] | Freq: Once | INTRAVENOUS | Status: AC
  Administered 2015-04-20: 1500 [IU] via INTRAVENOUS
  Filled 2015-04-20: qty 1500

## 2015-04-20 MED ORDER — HEPARIN BOLUS VIA INFUSION
1000.0000 [IU] | Freq: Once | INTRAVENOUS | Status: AC
  Administered 2015-04-20: 1000 [IU] via INTRAVENOUS
  Filled 2015-04-20: qty 1000

## 2015-04-20 MED ORDER — ASPIRIN EC 81 MG PO TBEC
81.0000 mg | DELAYED_RELEASE_TABLET | Freq: Every day | ORAL | Status: DC
  Administered 2015-04-20 – 2015-04-25 (×6): 81 mg via ORAL
  Filled 2015-04-20 (×9): qty 1

## 2015-04-20 NOTE — Progress Notes (Signed)
Subjective:   Travis [name] is a 74 y.o. gentleman with PMHx of arthritis and GERD. Hospitalized for CAP (4/5-4/14) with repeat admission for HCAP (4/25-4/28, treated with IV vancomycin and cefepime at that time), chest CT findings were suggestive of pleural effusions and pulmonary fibrosis, which led to a rheumatological work-up. The patient was found to be ANA neg, but SSA and SSB pos, RF pos. He was discharged on prednisone and has rheumatology follow-up. Readmitted on 02/17/2015 a DVT R leg below the knee. Admitted and started on IV heparin. He was found to be in renal failure which was new and nephrology was consulted. 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 Following the biopsy, patient has increased back pain, underwent a CT scan which showed a large perinephric hemorrhage. His anticoagulation was discontinued and IR was consulted and patient underwent a IVC filter placement. Patient received 3 days of high dose solumedrol the prednisone tapered down and patient was started on plasmapheresis. After finishing 7 days of plasmapheresis and a prednisone taper his renal function has not recovered and nephrology started dialysis, first session on 6/6 and second on 6/7. Vascular surgery was consulted and patient underwent left AV fistula placement on 6/6.   Admitted several days ago with respiratory failure. New TWI on ECG. Trop 0.56. Feels better after HD but confused at times. Now on nasal cannula Denies CP. +diarrhea    Intake/Output Summary (Last 24 hours) at 04/20/15 1320 Last data filed at 04/20/15 1100  Gross per 24 hour  Intake 382.46 ml  Output   2986 ml  Net -2603.54 ml    Current meds: . atorvastatin  40 mg Oral q1800  . carvedilol  6.25 mg Oral BID WC  . dextrose  1 ampule Intravenous Once  . feeding supplement (NEPRO CARB STEADY)  237 mL Oral BID BM  . feeding supplement (PRO-STAT SUGAR FREE 64)  30 mL Oral BID   . guaiFENesin  600 mg Oral BID  . hydrALAZINE  25 mg Oral 4 times per day  . insulin aspart  10 Units Intravenous Once  . isosorbide mononitrate  30 mg Oral Daily  . pantoprazole  40 mg Oral Daily  . piperacillin-tazobactam (ZOSYN)  IV  2.25 g Intravenous 3 times per day  . sodium chloride  3 mL Intravenous Q12H  . sodium polystyrene  30 g Oral Once  . [START ON 04/21/2015] vancomycin  750 mg Intravenous Q M,W,F-HD   Infusions: . heparin 1,500 Units/hr (04/20/15 0800)     Objective:  Blood pressure 140/84, pulse 88, temperature 97.3 F (36.3 C), temperature source Axillary, resp. rate 17, height 5\' 7"  (1.702 m), weight 71.6 kg (157 lb 13.6 oz), SpO2 100 %. Weight change: -16.5 kg (-36 lb 6 oz)  Physical Exam: General:  Elderly. Frail appearing HEENT: normal Neck: supple. JVP . Carotids 2+ bilat; no bruits. No lymphadenopathy or thryomegaly appreciated. Cor: PMI nondisplaced. Regular rate & rhythm. No rubs, gallops or murmurs. Lungs: minimal crackles Abdomen: soft, nontender, nondistended. No hepatosplenomegaly. No bruits or masses. Good bowel sounds. Extremities: no cyanosis, clubbing, rash, dry with chronic venous stasis changes Neuro: alert & orientedx1, cranial nerves grossly intact. moves all 4 extremities w/o difficulty. Affect pleasant  Telemetry: NSR  Lab Results: Basic Metabolic Panel:  Recent Labs Lab 04/18/15 1625 04/19/15 0030 04/19/15 1005 04/20/15 1100  NA 136 135 135 138  K 5.7* >7.5* 4.8 4.8  CL 96* 96* 97* 94*  CO2 29 17* 22 28  GLUCOSE 117* 102* 105* 91  BUN 11 18 22* 23*  CREATININE 4.32* 4.98* 5.29* 4.43*  CALCIUM 8.3* 8.4* 8.3* 8.6*   Liver Function Tests:  Recent Labs Lab 04/19/15 0030 04/20/15 1100  AST 107* 50*  ALT 31 28  ALKPHOS 111 115  BILITOT 1.7* 0.6  PROT 7.0 6.9  ALBUMIN 2.4* 2.2*   No results for input(s): LIPASE, AMYLASE in the last 168 hours. No results for input(s): AMMONIA in the last 168 hours. CBC:  Recent  Labs Lab 04/18/15 1625 04/19/15 1003 04/20/15 1100  WBC 15.1* 13.0* 14.6*  NEUTROABS 13.1*  --   --   HGB 14.0 12.6* 13.8  HCT 44.4 39.0 44.1  MCV 91.4 88.6 92.3  PLT 119* 138* 147*   Cardiac Enzymes:  Recent Labs Lab 04/18/15 2204  TROPONINI 0.56*   BNP: Invalid input(s): POCBNP CBG:  Recent Labs Lab 04/18/15 2048 04/19/15 1427 04/19/15 1550  GLUCAP 98 49* 98   Microbiology: Lab Results  Component Value Date   CULT NO GROWTH < 24 HOURS 04/19/2015   CULT NO GROWTH < 24 HOURS 04/19/2015   CULT NO GROWTH 1 DAY 04/19/2015   CULT  01/06/2015    Multiple bacterial morphotypes present, none predominant. Suggest appropriate recollection if clinically indicated. Performed at [place]  01/06/2015    NO GROWTH 5 DAYS Performed at [place] [facility] 04/19/15 0850 04/19/15 0905  CULT NO GROWTH < 24 HOURS NO GROWTH < 24 HOURS  SDES BLOOD HEMODIALYSIS CATHETER BLOOD HEMODIALYSIS CATHETER    Imaging: Dg Chest Port 1 View  04/18/2015   CLINICAL DATA:  74 year old male with shortness of breath, tachycardia. Renal failure on dialysis for 3 weeks. Initial encounter.  EXAM: PORTABLE CHEST - 1 VIEW  COMPARISON:  02/17/2015 and earlier.  FINDINGS: Portable AP semi upright view at 1605 hr. Tunneled right chest dialysis catheter is no longer present. Interval decreased but not resolved pleural effusions, residual more apparent on the right. Continued pulmonary vascular congestion, but also improved. Stable cardiac size and mediastinal contours. No pneumothorax. No consolidation.  IMPRESSION: Improved but not resolved pulmonary edema and small pleural effusions since June.  Tunneled right chest dialysis catheter has been removed.   Electronically Signed   By: Genevie Ann M.D.   On: 04/18/2015 16:15     ASSESSMENT:  1. New onset renal failure 2. Elevated troponin 3. Acute systolic HF with EF [id number] on preliminary echo 4. Acute respiratory  failure 5. Recent DVT s/p IVC filter  6. Acute delirium 7. HTN   PLAN/DISCUSSION:  Troponin elevation and ECG changes are non-specific particularly in setting of ESRD and respiratory distress. Echo shows moderate LV dysfunction (? Ischemia, vs HTN vs critical illness) but with more global HK (may be slight regional WMA inferiorly). With comorbidities he is not a candidate for cardiac cath. If remains stable overnight can consider Myoview early this week for further risk stratification versus medical therapy. Volume status to be managed with HD. Continue coreg, hydralazine/imdur, statin. Start ASA 81.    LOS: 2 days    Glori Bickers, MD 04/20/2015, 1:20 PM

## 2015-04-20 NOTE — Progress Notes (Signed)
  Echocardiogram 2D Echocardiogram has been performed.  [name] 04/20/2015, 10:54 AM

## 2015-04-20 NOTE — Progress Notes (Signed)
[facility] Rounding Note  Subjective:  Had urgent HD yesterday AM 2.9 liters off Bipap weaned to nasal cannula]  Had confusion during HD with disorientation Still just a little confused - thinks he was in HD "all day long" yesterday Wife says he has gotten more forgetful at home but she thinks close to his baseline now  HR and BP look much better  His only complaint this AM is diarrhea   Objective Vital signs in last 24 hours: Filed Vitals:   04/20/15 0200 04/20/15 0300 04/20/15 0400 04/20/15 0516  BP: 109/66 129/70 115/65 133/69  Pulse: 87 86 86   Temp:   97.3 F (36.3 C)   TempSrc:   Oral   Resp: 20 15 15    Height:      Weight:   71.6 kg (157 lb 13.6 oz)   SpO2: 100% 97% 100%    Weight change: -16.5 kg (-36 lb 6 oz)  Intake/Output Summary (Last 24 hours) at 04/20/15 0830 Last data filed at 04/20/15 0400  Gross per 24 hour  Intake 479.96 ml  Output   2984 ml  Net -2504.04 ml   Physical Exam:  BP 133/69 mmHg  Pulse 86  Temp(Src) 97.3 F (36.3 C) (Oral)  Resp 15  Ht 5\' 7"  (1.702 m)  Wt 71.6 kg (157 lb 13.6 oz)  BMI 24.72 kg/m2  SpO2 100%  Gen: Awake, alert, comfortable on nasal cannula Oriented to person and place, not to day or date VS as noted Skin: Dry and scaly Neck: No JVD now Chest Dry velcro type faint crackles Heart: S1S2 No S3 Cannot appreciate murmurs Abdomen: soft, no focal tenderness Ext Bilateral woody changes with thickened skin, no pitting Right AVG + bruit   Labs: Basic Metabolic Panel:  Recent Labs Lab 04/18/15 1625 04/19/15 0030 04/19/15 1005  NA 136 135 135  K 5.7* >7.5* 4.8  CL 96* 96* 97*  CO2 29 17* 22  GLUCOSE 117* 102* 105*  BUN 11 18 22*  CREATININE 4.32* 4.98* 5.29*  CALCIUM 8.3* 8.4* 8.3*   Liver Function Tests:  Recent Labs Lab 04/19/15 0030  AST 107*  ALT 31  ALKPHOS 111  BILITOT 1.7*  PROT 7.0  ALBUMIN 2.4*     Recent Labs Lab 04/18/15 1625 04/19/15 1003  WBC 15.1* 13.0*   NEUTROABS 13.1*  --   HGB 14.0 12.6*  HCT 44.4 39.0  MCV 91.4 88.6  PLT 119* 138*     Recent Labs Lab 04/18/15 2204  TROPONINI 0.56*   CBG:  Recent Labs Lab 04/18/15 2048 04/19/15 1427 04/19/15 1550  GLUCAP 98 49* 98    Studies/Results: Dg Chest Port 1 View  04/18/2015   CLINICAL DATA:  74 year old male with shortness of breath, tachycardia. Renal failure on dialysis for 3 weeks. Initial encounter.  EXAM: PORTABLE CHEST - 1 VIEW  COMPARISON:  02/17/2015 and earlier.  FINDINGS: Portable AP semi upright view at 1605 hr. Tunneled right chest dialysis catheter is no longer present. Interval decreased but not resolved pleural effusions, residual more apparent on the right. Continued pulmonary vascular congestion, but also improved. Stable cardiac size and mediastinal contours. No pneumothorax. No consolidation.  IMPRESSION: Improved but not resolved pulmonary edema and small pleural effusions since June.  Tunneled right chest dialysis catheter has been removed.   Electronically Signed   By: Genevie Ann M.D.   On: 04/18/2015 16:15   Medications: . heparin 1,500 Units/hr (04/20/15 0000)   . atorvastatin  40 mg  Oral q1800  . carvedilol  6.25 mg Oral BID WC  . dextrose  1 ampule Intravenous Once  . feeding supplement (NEPRO CARB STEADY)  237 mL Oral BID BM  . feeding supplement (PRO-STAT SUGAR FREE 64)  30 mL Oral BID  . guaiFENesin  600 mg Oral BID  . hydrALAZINE  25 mg Oral 4 times per day  . insulin aspart  10 Units Intravenous Once  . isosorbide mononitrate  30 mg Oral Daily  . pantoprazole  40 mg Oral Daily  . piperacillin-tazobactam (ZOSYN)  IV  2.25 g Intravenous 3 times per day  . sodium chloride  3 mL Intravenous Q12H  . sodium polystyrene  30 g Oral Once  . [START ON 04/21/2015] vancomycin  750 mg Intravenous Q M,W,F-HD   Dialysis Prescription MWF [facility] 4 hours 3K2Ca Right AVG EDW 75 kg Aranesp 150/week Venofer 50 /week HD temp 36 No calcitriol  Background  74 y.o.  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 Other problems include history of right DVT with prior IVC filter, Sjogrens, hypertension, past admissions for PNA and notes indicate pulmonary fibrosis as well. He is on dialysis MWF at [facility]. Went to dialysis 8/5 which was aborted after 2 hours and 40 minutes due to progressive dyspnea and tachycardia (and did not therefore achieve his EDW of 75 - got to 76.2) . Subsequently seen in the ED for progressive dyspnea, was hypoxemia with O2sats of 89%, was tachycardic (115-120) and hypertensive (160's/100's). Chest x ray showed vascular congestion. Troponin was elevated at 0.68. Had new anterolateral changes on ECG with question of new ischemia. Was felt to have acute diastolic CHF. Lactic acid elevated at 7.2, WBC elevated at 15,000 and ATB's have started.  We were asked to see to provide additional dialysis for volume removal and correction of electrolyte issues (hyperkalemia)  Assessment/Recommendations  1. 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 Outpt dialysis on 8/5 aborted after 2'40" 2/2 progressive dyspnea and tachycardia. Evidence for persistent volume overload/interstitial edema on adm. Had urgent HD  8/6 for volume and hyperkalemia. 2.9 liters off. Weaned to nasal cannula. Labs not done yet this AM (unable to draw earlier). Minimally need renal panel to evaluate K. Next HD 8/8. Try to get accurate weights. (Weights not reliable. EDW 75 - last wt 71.6 so EDW "may" be lower...)  2. Leukocytosis, elevated lactate - r/o sepsis. Presume HCAP. Blood cultures in process. On vanco and zosyn. Repeat lactic acid yesterday pre HD was down to 2.9 from 7.2. Afebrile. No CBC today so cannot comment on WBC.  3. NSTEMI - Elevated troponin, anterolateral ECG changes new - cardiology evaluating  for ischemia. May need heart cath. On IV heparin/ASA/statin 4. Acute diastolic CHF - improved after HD and volume removal. 2.9 liters off 8/6. HTN - BB 5. H/o DVT s/p IVC filter 02/2015 - not on anticoagulation as outpt.(on IV heparin here for NSTEMI) 6. Anemia - on outpt Aranesp 150 mcg qweek last given 8/2. Hb 12.6 on 8/6 - holding ESA. 7. 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 Not on VDRA or cincacalcet as PTH in goal range of 150-300 8. Diarrhea - on CDiff precautions d/t loose stools. CDiff not yet collected.  Jamal Maes, MD [facility] [phone] pager 04/20/2015, 8:30 AM

## 2015-04-20 NOTE — Progress Notes (Signed)
ANTICOAGULATION CONSULT NOTE - Follow Up Consult  Pharmacy Consult for heparin Indication: NSTEMI  No Known Allergies  Patient Measurements: Height: 5\' 7"  (170.2 cm) Weight: 157 lb 13.6 oz (71.6 kg) IBW/kg (Calculated) : 66.1  Vital Signs: Temp: 97.3 F (36.3 C) (08/07 1149) Temp Source: Axillary (08/07 1149) BP: 140/84 mmHg (08/07 1149) Pulse Rate: 88 (08/07 1149)  Labs:  Recent Labs  04/18/15 1625 04/18/15 2204 04/19/15 0030 04/19/15 1003 04/19/15 1005 04/19/15 2220 04/20/15 1100  HGB 14.0  --   --  12.6*  --   --  13.8  HCT 44.4  --   --  39.0  --   --  44.1  PLT 119*  --   --  138*  --   --  147*  APTT  --   --   --  42*  --   --   --   LABPROT  --   --  14.9  --   --   --   --   INR  --   --  1.15  --   --   --   --   HEPARINUNFRC  --   --   --  <0.10*  --  0.15* 0.20*  CREATININE 4.32*  --  4.98*  --  5.29*  --  4.43*  TROPONINI  --  0.56*  --   --   --   --   --      Assessment: 73 yo on heparin for NSTEMI.  HL subtherapeutic at 0.2 on 1500 units/hr.  CBC stable, no issues per RN.  Goal of Therapy:  Heparin level 0.3-0.7 units/ml Monitor platelets by anticoagulation protocol: Yes   Plan:  Heparin bolus 1,000 units x 1 Increase heparin infusion to 1650 units/hr 8-hr HL @ 2130 (hard stick, hopefully can draw) Monitor hgb/plts, s/s of bleeding  Travis [name] L. Nicole Kindred, PharmD Clinical Pharmacy Resident Pager: [phone] 04/20/2015 1:35 PM

## 2015-04-20 NOTE — Progress Notes (Signed)
[facility] PROGRESS NOTE  Travis [name] [id number] DOB: March 17, 1941 DOA: 04/18/2015 PCP: [name], MD  Assessment/Plan: Principal Problem:   NSTEMI (non-ST elevated myocardial infarction) Active Problems:   Pulmonary fibrosis   HCAP (healthcare-associated pneumonia)   Sjogren's disease   DVT (deep venous thrombosis)   ESRD on dialysis   Acute on chronic diastolic CHF (congestive heart failure)   Accelerated hypertension   Protein-calorie malnutrition, severe     1. NSTEMI (non-ST elevated myocardial infarction), 2-D echo pending Patient is presenting with complaints of chest pain as well as progressively worsening shortness of breath likely secondary to acute on chronic heart failure. Troponin peaked at 0.68, now trending down, cardiology following Patient is currently on a heparin drip which may be discontinued today, awaiting cardiology to see patient before this is discontinued  Continue aspirin.  If significant Troponin elevation and abnormal echo (prior showed normal LVEF with normal wall motion) cardiology might consider cath once he improves clinically Venous Doppler negative, low suspicion for PE  2. Acute on chronic diastolic heart failure 2-2 1/2 L via hemodialysis  Continue  Imdur and hydralazine   3. Accelerated hypertension.  Continue Coreg, by mouth hydralazine, imdur,  4. Possible healthcare related pneumonia with pulmonary fibrosis. Discussed with the critical care, diffuse pulmonary fibrosis component is very mild. At present the patient appears to be having cough with expectoration as well as leukocytosis. Continue vancomycin and Zosyn  for healthcare associated pneumonia vancomycin and Zosyn. Follow cultures. Lactic acidosis is improving   5. Anxiety. Currently the patient appears to be significantly anxious although the treatment options are limited due to critical condition. We'll continue to closely monitor.  6. 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 Other problems include history of right DVT with prior IVC filter, Sjogrens, hypertension, past admissions for PNA and notes indicate pulmonary fibrosis as well. He is on dialysis MWF at [facility]. He went to dialysis yesterday for his regular treatment, which was aborted after 2 hours and 40 minutes due to progressive dyspnea and tachycardia (and did not therefore achieve his EDW of 75 - got to 76.2) .   He was seen in the ED for progressive dyspnea, was hypoxemia with O2sats of 89%, was tachycardic (115-120) and hypertensive (160's/100's). Was also complaining of a rash of about 10 days duration. Chest x ray showed vascular congestion. Troponin was elevated at 0.68. Had new anterolateral changes on ECG with question of new ischemia. Was felt to have acute diastolic CHF.   Lactic acid elevated at 7.2, WBC elevated at 15,000 and ATB's have been started.    Consultants:  Nephrology  Cardiology  Procedures:  None  Antibiotics: Anti-infectives    Start     Dose/Rate Route Frequency Ordered Stop   04/21/15 1200  vancomycin (VANCOCIN) IVPB 1000 mg/200  mL premix  Status:  Discontinued     1,000 mg 200 mL/hr over 60 Minutes Intravenous Every M-W-F (Hemodialysis) 04/18/15 2318 04/19/15 0833   04/21/15 1200  vancomycin (VANCOCIN) IVPB 750 mg/150 ml premix     750  mg 150 mL/hr over 60 Minutes Intravenous Every M-W-F (Hemodialysis) 04/19/15 0833     04/19/15 1200  vancomycin (VANCOCIN) 750 mg in sodium chloride 0.9 % 150 mL IVPB     750 mg 150 mL/hr over 60 Minutes Intravenous  Once 04/19/15 1158 04/19/15 1300   04/18/15 2330  piperacillin-tazobactam (ZOSYN) IVPB 2.25 g     2.25 g 100 mL/hr over 30 Minutes Intravenous 3 times per day 04/18/15 2318     04/18/15 2330  vancomycin (VANCOCIN) 2,000 mg in sodium chloride 0.9 % 500 mL IVPB     2,000 mg 250 mL/hr over 120 Minutes Intravenous  Once 04/18/15 2318 04/19/15 0254         HPI/Subjective: Patient has had some diarrhea overnight, denies any chest pain or shortness of breath, afebrile overnight,  Objective: Filed Vitals:   04/20/15 0300 04/20/15 0400 04/20/15 0516 04/20/15 0800  BP: 129/70 115/65 133/69 129/70  Pulse: 86 86    Temp:  97.3 F (36.3 C)    TempSrc:  Oral    Resp: 15 15  16   Height:      Weight:  71.6 kg (157 lb 13.6 oz)    SpO2: 97% 100%      Intake/Output Summary (Last 24 hours) at 04/20/15 0901 Last data filed at 04/20/15 0800  Gross per 24 hour  Intake 529.46 ml  Output   2985 ml  Net -2455.54 ml    Exam:  General: No acute respiratory distress Lungs: Clear to auscultation bilaterally without wheezes or crackles Cardiovascular: Regular rate and rhythm without murmur gallop or rub normal S1 and S2 Abdomen: Nontender, nondistended, soft, bowel sounds positive, no rebound, no ascites, no appreciable mass Extremities: No significant cyanosis, clubbing, or edema bilateral lower extremities     Data Review   Micro Results Recent Results (from the past 240 hour(s))  MRSA PCR Screening     Status: None   Collection Time: 04/18/15 10:19 PM  Result Value Ref Range Status   MRSA by PCR NEGATIVE NEGATIVE Final    Comment:        The GeneXpert MRSA Assay (FDA approved for NASAL specimens only), is one component of a comprehensive MRSA  colonization surveillance program. It is not intended to diagnose MRSA infection nor to guide or monitor treatment for MRSA infections.     Radiology Reports Dg Chest Port 1 View  04/18/2015   CLINICAL DATA:  74 year old male with shortness of breath, tachycardia. Renal failure on dialysis for 3 weeks. Initial encounter.  EXAM: PORTABLE CHEST - 1 VIEW  COMPARISON:  02/17/2015 and earlier.  FINDINGS: Portable AP semi upright view at 1605 hr. Tunneled right chest dialysis catheter is no longer present. Interval decreased but not resolved pleural effusions, residual more apparent on the right. Continued pulmonary vascular congestion, but also improved. Stable cardiac size and mediastinal contours. No pneumothorax. No consolidation.  IMPRESSION: Improved but not resolved pulmonary edema and small pleural effusions since June.  Tunneled right chest dialysis catheter has been removed.   Electronically Signed   By: Genevie Ann M.D.   On: 04/18/2015 16:15     CBC  Recent Labs Lab 04/18/15 1625 04/19/15 1003  WBC 15.1* 13.0*  HGB 14.0 12.6*  HCT  44.4 39.0  PLT 119* 138*  MCV 91.4 88.6  MCH 28.8 28.6  MCHC 31.5 32.3  RDW 20.4* 20.3*  LYMPHSABS 0.4*  --   MONOABS 1.6*  --   EOSABS 0.0  --   BASOSABS 0.1  --     Chemistries   Recent Labs Lab 04/18/15 1625 04/19/15 0030 04/19/15 1005  NA 136 135 135  K 5.7* >7.5* 4.8  CL 96* 96* 97*  CO2 29 17* 22  GLUCOSE 117* 102* 105*  BUN 11 18 22*  CREATININE 4.32* 4.98* 5.29*  CALCIUM 8.3* 8.4* 8.3*  AST  --  107*  --   ALT  --  31  --   ALKPHOS  --  111  --   BILITOT  --  1.7*  --    ------------------------------------------------------------------------------------------------------------------ estimated creatinine clearance is 11.6 mL/min (by C-G formula based on Cr of 5.29). ------------------------------------------------------------------------------------------------------------------ No results for input(s): HGBA1C in the last 72  hours. ------------------------------------------------------------------------------------------------------------------ No results for input(s): CHOL, HDL, LDLCALC, TRIG, CHOLHDL, LDLDIRECT in the last 72 hours. ------------------------------------------------------------------------------------------------------------------ No results for input(s): TSH, T4TOTAL, T3FREE, THYROIDAB in the last 72 hours.  Invalid input(s): FREET3 ------------------------------------------------------------------------------------------------------------------ No results for input(s): VITAMINB12, FOLATE, FERRITIN, TIBC, IRON, RETICCTPCT in the last 72 hours.  Coagulation profile  Recent Labs Lab 04/19/15 0030  INR 1.15    No results for input(s): DDIMER in the last 72 hours.  Cardiac Enzymes  Recent Labs Lab 04/18/15 2204  TROPONINI 0.56*   ------------------------------------------------------------------------------------------------------------------ Invalid input(s): POCBNP   CBG:  Recent Labs Lab 04/18/15 2048 04/19/15 1427 04/19/15 1550  GLUCAP 98 49* 98       Studies: Dg Chest Port 1 View  04/18/2015   CLINICAL DATA:  74 year old male with shortness of breath, tachycardia. Renal failure on dialysis for 3 weeks. Initial encounter.  EXAM: PORTABLE CHEST - 1 VIEW  COMPARISON:  02/17/2015 and earlier.  FINDINGS: Portable AP semi upright view at 1605 hr. Tunneled right chest dialysis catheter is no longer present. Interval decreased but not resolved pleural effusions, residual more apparent on the right. Continued pulmonary vascular congestion, but also improved. Stable cardiac size and mediastinal contours. No pneumothorax. No consolidation.  IMPRESSION: Improved but not resolved pulmonary edema and small pleural effusions since June.  Tunneled right chest dialysis catheter has been removed.   Electronically Signed   By: Genevie Ann M.D.   On: 04/18/2015 16:15      Lab Results   Component Value Date   HGBA1C 4.9 01/30/2015   HGBA1C 4.6* 01/29/2015   Lab Results  Component Value Date   CREATININE 5.29* 04/19/2015       Scheduled Meds: . atorvastatin  40 mg Oral q1800  . carvedilol  6.25 mg Oral BID WC  . dextrose  1 ampule Intravenous Once  . feeding supplement (NEPRO CARB STEADY)  237 mL Oral BID BM  . feeding supplement (PRO-STAT SUGAR FREE 64)  30 mL Oral BID  . guaiFENesin  600 mg Oral BID  . hydrALAZINE  25 mg Oral 4 times per day  . insulin aspart  10 Units Intravenous Once  . isosorbide mononitrate  30 mg Oral Daily  . pantoprazole  40 mg Oral Daily  . piperacillin-tazobactam (ZOSYN)  IV  2.25 g Intravenous 3 times per day  . sodium chloride  3 mL Intravenous Q12H  . sodium polystyrene  30 g Oral Once  . [START ON 04/21/2015] vancomycin  750 mg Intravenous Q M,W,F-HD   Continuous Infusions: .  heparin 1,500 Units/hr (04/20/15 0800)    Principal Problem:   NSTEMI (non-ST elevated myocardial infarction) Active Problems:   Pulmonary fibrosis   HCAP (healthcare-associated pneumonia)   Sjogren's disease   DVT (deep venous thrombosis)   ESRD on dialysis   Acute on chronic diastolic CHF (congestive heart failure)   Accelerated hypertension   Protein-calorie malnutrition, severe    Time spent: 45 minutes   [facility] Pager [phone]. If 7PM-7AM, please contact night-coverage at [email or web address], password [facility] 04/20/2015, 9:01 AM  LOS: 2 days

## 2015-04-20 NOTE — Progress Notes (Signed)
Chaplain Note: Visited with patient's family in waiting area and provided ministry of caring presence and social support.  Dorris Fetch, MDiv. [place]

## 2015-04-20 NOTE — Progress Notes (Addendum)
ANTICOAGULATION CONSULT NOTE - Follow Up Consult  Pharmacy Consult for Heparin  Indication: NSTEMI  No Known Allergies  Patient Measurements: Height: 5\' 7"  (170.2 cm) Weight: 157 lb 13.6 oz (71.6 kg) IBW/kg (Calculated) : 66.1  Vital Signs: Temp: 98 F (36.7 C) (08/07 2000) Temp Source: Oral (08/07 2000) BP: 122/66 mmHg (08/07 1756) Pulse Rate: 92 (08/07 1642)  Labs:  Recent Labs  04/18/15 1625 04/18/15 2204 04/19/15 0030  04/19/15 1003 04/19/15 1005 04/19/15 2220 04/20/15 1100 04/20/15 2138  HGB 14.0  --   --   --  12.6*  --   --  13.8  --   HCT 44.4  --   --   --  39.0  --   --  44.1  --   PLT 119*  --   --   --  138*  --   --  147*  --   APTT  --   --   --   --  42*  --   --   --   --   LABPROT  --   --  14.9  --   --   --   --   --   --   INR  --   --  1.15  --   --   --   --   --   --   HEPARINUNFRC  --   --   --   < > <0.10*  --  0.15* 0.20* 0.10*  CREATININE 4.32*  --  4.98*  --   --  5.29*  --  4.43*  --   TROPONINI  --  0.56*  --   --   --   --   --   --   --   < > = values in this interval not displayed.  Estimated Creatinine Clearance: 13.9 mL/min (by C-G formula based on Cr of 4.43).   Assessment: Sub-therapeutic heparin level despite rate increase  Goal of Therapy:  Heparin level 0.3-0.7 units/ml Monitor platelets by anticoagulation protocol: Yes   Plan:  -Heparin 1500 units BOLUS -Increase drip to 1850 units/hr -Check next level with AM labs (they may get drawn with HD per RN), try to coordinate HL with other labs as pt is HARD STICK -Daily CBC/HL -Monitor for bleeding  Narda Bonds 04/20/2015,10:51 PM  ==================== Addendum 4:16 AM  Elevated HL of 0.71 drawn too early after a heparin bolus, will likely trend down some -Re-check HL ~0900-1000 (can be drawn prior to/with HD due to being hard stick) Sharlynn Oliphant, PharmD

## 2015-04-20 NOTE — Progress Notes (Signed)
Text paged Travis [name] for order to check stool for C-Diff. Patient had 3 loose stools tonight. Will place patient on C-Diff precautions until ruled out.

## 2015-04-20 NOTE — Progress Notes (Signed)
Utilization review completed.  

## 2015-04-21 LAB — RENAL FUNCTION PANEL
ALBUMIN: 1.9 g/dL — AB (ref 3.5–5.0)
ANION GAP: 14 (ref 5–15)
BUN: 34 mg/dL — ABNORMAL HIGH (ref 6–20)
CALCIUM: 8.2 mg/dL — AB (ref 8.9–10.3)
CHLORIDE: 96 mmol/L — AB (ref 101–111)
CO2: 26 mmol/L (ref 22–32)
Creatinine, Ser: 5.49 mg/dL — ABNORMAL HIGH (ref 0.61–1.24)
GFR calc Af Amer: 11 mL/min — ABNORMAL LOW (ref >60)
GFR calc non Af Amer: 9 mL/min — ABNORMAL LOW (ref >60)
GLUCOSE: 110 mg/dL — AB (ref 65–99)
PHOSPHORUS: 3.9 mg/dL (ref 2.5–4.6)
POTASSIUM: 3.7 mmol/L (ref 3.5–5.1)
Sodium: 136 mmol/L (ref 135–145)

## 2015-04-21 LAB — CBC
HCT: 38.2 % — ABNORMAL LOW (ref 39.0–52.0)
Hemoglobin: 12.1 g/dL — ABNORMAL LOW (ref 13.0–17.0)
MCH: 28.6 pg (ref 26.0–34.0)
MCHC: 31.7 g/dL (ref 30.0–36.0)
MCV: 90.3 fL (ref 78.0–100.0)
PLATELETS: 162 10*3/uL (ref 150–400)
RBC: 4.23 MIL/uL (ref 4.22–5.81)
RDW: 21.3 % — ABNORMAL HIGH (ref 11.5–15.5)
WBC: 14.1 10*3/uL — ABNORMAL HIGH (ref 4.0–10.5)

## 2015-04-21 LAB — HEPARIN LEVEL (UNFRACTIONATED)
Heparin Unfractionated: 0.71 IU/mL — ABNORMAL HIGH (ref 0.30–0.70)
Heparin Unfractionated: 0.88 IU/mL — ABNORMAL HIGH (ref 0.30–0.70)

## 2015-04-21 LAB — TROPONIN I: Troponin I: 0.54 ng/mL (ref <0.031)

## 2015-04-21 MED ORDER — HEPARIN SODIUM (PORCINE) 5000 UNIT/ML IJ SOLN
5000.0000 [IU] | Freq: Three times a day (TID) | INTRAMUSCULAR | Status: DC
  Administered 2015-04-21 – 2015-04-26 (×13): 5000 [IU] via SUBCUTANEOUS
  Filled 2015-04-21 (×20): qty 1

## 2015-04-21 MED ORDER — RENA-VITE PO TABS
1.0000 | ORAL_TABLET | Freq: Every day | ORAL | Status: DC
  Administered 2015-04-21 – 2015-04-25 (×5): 1 via ORAL
  Filled 2015-04-21 (×6): qty 1

## 2015-04-21 NOTE — Progress Notes (Signed)
[facility] Progress Note  Assessment/Plan: 1. 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 Outpt dialysis on 8/5 aborted after 2'40" 2/2 progressive dyspnea and tachycardia. Evidence for persistent volume overload/interstitial edema on adm. Had urgent HD 8/6 for volume and hyperkalemia. 2.9 liters off. Weaned to nasal cannula. Next HD 8/8. Try to get accurate weights. (Weights not reliable. EDW 75 - pre HD weight 74.6 - unable to stand per nursing 2. Leukocytosis, elevated lactate - r/o sepsis. Presume HCAP. Blood cultures in process. On vanco and zosyn. pre HD was down to 2.9 from 7.2. Afebrile. No CBC today so cannot comment on WBC.  3. NSTEMI - Elevated troponin, anterolateral ECG changes new - cardiology evaluating for ischemia. May need heart cath. On IV heparin/ASA/statin 4. Acute diastolic CHF/BP - improved after HD and volume removal. 2.9 liters off 8/6; on BB- will have lower EDW 5. H/o DVT s/p IVC filter 02/2015 - not on anticoagulation as outpt.(on IV heparin here for NSTEMI) 6. Anemia - on outpt Aranesp 150 mcg qweek last given 8/2. Hb 12..1 holding ESA. 7. Secondary hyperparathyroidism - last outpt PTH 250. Not on VDRA or cincacalcet as PTH in goal range of 150-300 8. Diarrhea - on CDiff precautions d/t loose stools. CDiff neg but still on enteric precautions 9. Nutrition - Alb 2.2 - renal diet/prostat/supplements/ add renal vitamin 10. Rheum - w/u for pulm fibrosis in April showed +RA/ SSA/ SSB. ANA/ANCA/ UPEP neg. Was dc'd on pred 20/d to flu with Rheum in clinic.  11. HTN - on coreg/ hydralazine, BP's good  Myriam Jacobson, PA-C [place] 04/21/2015,8:55 AM  LOS: 3 days   Pt seen, examined, agree w assess/plan as above with additions as indicated.  Kelly Splinter MD pager [phone]    cell [phone] 04/21/2015, 10:45 AM       Subjective:   No diarrhea this am.  No pain.  Objective Filed Vitals:   04/21/15  0349 04/21/15 0400 04/21/15 0630 04/21/15 0751  BP: 126/70 127/82 138/74 125/69  Pulse: 84   107  Temp: 98 F (36.7 C)   97.7 F (36.5 C)  TempSrc: Oral   Oral  Resp:  10  18  Height:      Weight:   72 kg (158 lb 11.7 oz)   SpO2: 100%   100%   Physical Exam goal 2.5 General:NAD supine on HD Heart: RRR Lungs:grossly clear anterioraly supine Abdomen: soft NT Extremities: tr LE edema, thickened skin Dialysis Access: right AVGG Qb 400  Dialysis Orders: MWF [facility],4 hours 3K2Ca Right AVG EDW 75 kgAranesp 150/week Venofer 50 /week HD temp 36 No calcitriol  Additional Objective Labs: Basic Metabolic Panel:  Recent Labs Lab 04/19/15 0030 04/19/15 1005 04/20/15 1100  NA 135 135 138  K >7.5* 4.8 4.8  CL 96* 97* 94*  CO2 17* 22 28  GLUCOSE 102* 105* 91  BUN 18 22* 23*  CREATININE 4.98* 5.29* 4.43*  CALCIUM 8.4* 8.3* 8.6*   Liver Function Tests:  Recent Labs Lab 04/19/15 0030 04/20/15 1100  AST 107* 50*  ALT 31 28  ALKPHOS 111 115  BILITOT 1.7* 0.6  PROT 7.0 6.9  ALBUMIN 2.4* 2.2*   CBC:  Recent Labs Lab 04/18/15 1625 04/19/15 1003 04/20/15 1100 04/21/15 0258  WBC 15.1* 13.0* 14.6* 14.1*  NEUTROABS 13.1*  --   --   --   HGB 14.0 12.6* 13.8 12.1*  HCT 44.4 39.0 44.1 38.2*  MCV 91.4 88.6 92.3 90.3  PLT 119* 138* 147* 162   Blood Culture    Component Value Date/Time   SDES BLOOD HEMODIALYSIS CATHETER 04/19/2015 0905   SPECREQUEST BOTTLES DRAWN AEROBIC AND ANAEROBIC 10CC 04/19/2015 0905   CULT NO GROWTH < 24 HOURS 04/19/2015 0905   REPTSTATUS PENDING 04/19/2015 0905    Cardiac Enzymes:  Recent Labs Lab 04/18/15 2204  TROPONINI 0.56*   CBG:  Recent Labs Lab 04/18/15 2048 04/19/15 1427 04/19/15 1550  GLUCAP 98 49* 98   Medications: . heparin 1,850 Units/hr (04/20/15 2317)   . aspirin EC  81 mg Oral Daily  . atorvastatin  40 mg Oral q1800  . carvedilol  6.25 mg Oral BID WC  . dextrose  1 ampule Intravenous Once  . feeding supplement  (NEPRO CARB STEADY)  237 mL Oral BID BM  . feeding supplement (PRO-STAT SUGAR FREE 64)  30 mL Oral BID  . guaiFENesin  600 mg Oral BID  . hydrALAZINE  25 mg Oral 4 times per day  . insulin aspart  10 Units Intravenous Once  . isosorbide mononitrate  30 mg Oral Daily  . pantoprazole  40 mg Oral Daily  . piperacillin-tazobactam (ZOSYN)  IV  2.25 g Intravenous 3 times per day  . sodium chloride  3 mL Intravenous Q12H  . sodium polystyrene  30 g Oral Once  . vancomycin  750 mg Intravenous Q M,W,F-HD

## 2015-04-21 NOTE — Progress Notes (Signed)
[facility] PROGRESS NOTE  Travis [name] Travis [name] DOB: [birth year] DOA: 04/18/2015 PCP: [name], MD  Assessment/Plan: Principal Problem:   NSTEMI (non-ST elevated myocardial infarction) Active Problems:   Pulmonary fibrosis   HCAP (healthcare-associated pneumonia)   Sjogren's disease   DVT (deep venous thrombosis)   ESRD on dialysis   Acute on chronic diastolic CHF (congestive heart failure)   Accelerated hypertension   Protein-calorie malnutrition, severe     1. NSTEMI (non-ST elevated myocardial infarction), 2-D echo shows EF of 35-40%, no regional wall motion abnormalities Patient is presented with chest pain as well as progressively worsening shortness of breath likely secondary to acute on chronic heart failure. Troponin peaked at 0.68, now trending down, cardiology following Patient is currently on a heparin drip which may be discontinued, awaiting cardiology to make further recommendations  Continue aspirin/statin.  With comorbidities he is not a candidate for cardiac cath as per cardiology. If remains stable overnight can consider Myoview early this week for further risk stratification versus medical therapy. Venous Doppler negative, low suspicion for PE  2. Acute on chronic diastolic heart failure 2-2 1/2 L via hemodialysis, Next HD 8/8.  Continue  Imdur and hydralazine   3. Accelerated hypertension.  Continue Coreg, by mouth hydralazine, imdur,  4. Possible healthcare related pneumonia with pulmonary fibrosis. Discussed with the critical care, diffuse pulmonary fibrosis component is very mild. At present the patient appears to be having cough with expectoration as well as leukocytosis. Continue vancomycin and Zosyn  for healthcare associated pneumonia vancomycin and Zosyn. Discontinue Vanco and Zosyn and switched to Augmentin tomorrow and blood culture status negative SLP consult to rule out aspiration   5. Anxiety. Currently the patient  appears to be significantly anxious although the treatment options are limited due to critical condition. We'll continue to closely monitor.  6. 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 H/o DVT s/p IVC filter 02/2015 - not on anticoagulation as outpt, venous Doppler negative  8 diarrhea-. C. difficile negative, DC enteric precautions   Code Status:      Code Status Orders        Start     Ordered   04/18/15 2302  Full code   Continuous     04/18/15 2307     Family Communication: family updated about patient's clinical progress Disposition Plan: Transfer to telemetry   Brief narrative: 74 y.o. 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 Other problems include history of right DVT with prior IVC filter, Sjogrens, hypertension, past admissions for PNA and notes indicate pulmonary fibrosis as well. He is on dialysis MWF at [facility]. He went to dialysis yesterday for his regular treatment, which was aborted after 2 hours and 40 minutes due to progressive dyspnea and tachycardia (and did not therefore achieve his EDW of 75 - got to 76.2) .   He was seen in the ED for progressive dyspnea, was hypoxemia with O2sats of 89%, was tachycardic (115-120) and hypertensive (160's/100's). Was also complaining of a rash of about 10 days duration. Chest x ray showed vascular congestion. Troponin was elevated at 0.68. Had new anterolateral changes on ECG with question of new ischemia. Was felt to have acute diastolic CHF.   Lactic acid elevated at 7.2, WBC elevated at 15,000 and ATB's have been started.  Consultants:  Nephrology  Cardiology  Procedures:  None  Antibiotics: Anti-infectives    Start     Dose/Rate Route Frequency Ordered Stop   04/21/15 1200   vancomycin (VANCOCIN) IVPB 1000 mg/200 mL premix  Status:  Discontinued     1,000 mg 200 mL/hr over 60 Minutes Intravenous Every M-W-F (Hemodialysis) 04/18/15 2318 04/19/15 0833   04/21/15 1200  vancomycin (VANCOCIN) IVPB 750 mg/150 ml premix     750 mg 150 mL/hr over 60 Minutes Intravenous Every M-W-F (Hemodialysis) 04/19/15 0833     04/19/15 1200  vancomycin (VANCOCIN) 750 mg in sodium chloride 0.9 % 150 mL IVPB     750 mg 150 mL/hr over 60 Minutes Intravenous  Once 04/19/15 1158 04/19/15 1300   04/18/15 2330  piperacillin-tazobactam (ZOSYN) IVPB 2.25 g     2.25 g 100 mL/hr over 30 Minutes Intravenous 3 times per day 04/18/15 2318     04/18/15 2330  vancomycin (VANCOCIN) 2,000 mg in sodium chloride 0.9 % 500 mL IVPB     2,000 mg 250 mL/hr over 120 Minutes Intravenous  Once 04/18/15 2318 04/19/15 0254         HPI/Subjective: Patient has had some diarrhea overnight, denies any chest pain or shortness of breath, afebrile overnight, diarrhea improving  Objective: Filed Vitals:   04/21/15 0858 04/21/15 0930 04/21/15 1000 04/21/15 1030  BP: 126/73 99/53 115/54 115/52  Pulse: 104 107 107 107  Temp:      TempSrc:      Resp:      Height:      Weight:      SpO2:        Intake/Output Summary (Last 24 hours) at 04/21/15 1049 Last data filed at 04/21/15 0900  Gross per 24 hour  Intake 311.51 ml  Output      1 ml  Net 310.51 ml    Exam:  General: No acute respiratory distress Lungs: Clear to auscultation bilaterally without wheezes or crackles Cardiovascular: Regular rate and rhythm without murmur gallop or rub normal S1 and S2 Abdomen: Nontender, nondistended, soft, bowel sounds positive, no rebound, no ascites, no appreciable mass Extremities: No significant cyanosis, clubbing, or edema bilateral lower extremities     Data Review   Micro Results Recent Results (from the past 240 hour(s))  MRSA PCR Screening     Status: None   Collection Time: 04/18/15 10:19 PM   Result Value Ref Range Status   MRSA by PCR NEGATIVE NEGATIVE Final    Comment:        The GeneXpert MRSA Assay (FDA approved for NASAL specimens only), is one component of a comprehensive MRSA colonization surveillance program. It is not intended to diagnose MRSA infection nor to guide or monitor treatment for MRSA infections.   Culture, blood (x 2)     Status: None (Preliminary result)   Collection Time: 04/19/15  1:12 AM  Result Value Ref Range Status   Specimen Description BLOOD LEFT HAND  Final   Special Requests IN PEDIATRIC BOTTLE 2CC  Final   Culture NO GROWTH 1 DAY  Final   Report Status PENDING  Incomplete  Culture, blood (routine x 2)     Status: None (Preliminary result)   Collection Time: 04/19/15  8:50 AM  Result Value Ref Range Status   Specimen Description BLOOD HEMODIALYSIS CATHETER  Final   Special Requests BOTTLES DRAWN AEROBIC AND ANAEROBIC 10CC  Final   Culture NO GROWTH < 24 HOURS  Final   Report Status  PENDING  Incomplete  Culture, blood (routine x 2)     Status: None (Preliminary result)   Collection Time: 04/19/15  9:05 AM  Result Value Ref Range Status   Specimen Description BLOOD HEMODIALYSIS CATHETER  Final   Special Requests BOTTLES DRAWN AEROBIC AND ANAEROBIC 10CC  Final   Culture NO GROWTH < 24 HOURS  Final   Report Status PENDING  Incomplete  C difficile quick scan w PCR reflex     Status: None   Collection Time: 04/20/15  8:40 AM  Result Value Ref Range Status   C Diff antigen NEGATIVE NEGATIVE Final   C Diff toxin NEGATIVE NEGATIVE Final   C Diff interpretation Negative for toxigenic C. difficile  Final    Radiology Reports Dg Chest Port 1 View  04/18/2015   CLINICAL DATA:  75 year old male with shortness of breath, tachycardia. Renal failure on dialysis for 3 weeks. Initial encounter.  EXAM: PORTABLE CHEST - 1 VIEW  COMPARISON:  02/17/2015 and earlier.  FINDINGS: Portable AP semi upright view at 1605 hr. Tunneled right chest dialysis  catheter is no longer present. Interval decreased but not resolved pleural effusions, residual more apparent on the right. Continued pulmonary vascular congestion, but also improved. Stable cardiac size and mediastinal contours. No pneumothorax. No consolidation.  IMPRESSION: Improved but not resolved pulmonary edema and small pleural effusions since June.  Tunneled right chest dialysis catheter has been removed.   Electronically Signed   By: Genevie Ann M.D.   On: 04/18/2015 16:15     CBC  Recent Labs Lab 04/18/15 1625 04/19/15 1003 04/20/15 1100 04/21/15 0258  WBC 15.1* 13.0* 14.6* 14.1*  HGB 14.0 12.6* 13.8 12.1*  HCT 44.4 39.0 44.1 38.2*  PLT 119* 138* 147* 162  MCV 91.4 88.6 92.3 90.3  MCH 28.8 28.6 28.9 28.6  MCHC 31.5 32.3 31.3 31.7  RDW 20.4* 20.3* 21.3* 21.3*  LYMPHSABS 0.4*  --   --   --   MONOABS 1.6*  --   --   --   EOSABS 0.0  --   --   --   BASOSABS 0.1  --   --   --     Chemistries   Recent Labs Lab 04/18/15 1625 04/19/15 0030 04/19/15 1005 04/20/15 1100 04/21/15 0900  NA 136 135 135 138 136  K 5.7* >7.5* 4.8 4.8 3.7  CL 96* 96* 97* 94* 96*  CO2 29 17* 22 28 26   GLUCOSE 117* 102* 105* 91 110*  BUN 11 18 22* 23* 34*  CREATININE 4.32* 4.98* 5.29* 4.43* 5.49*  CALCIUM 8.3* 8.4* 8.3* 8.6* 8.2*  AST  --  107*  --  50*  --   ALT  --  31  --  28  --   ALKPHOS  --  111  --  115  --   BILITOT  --  1.7*  --  0.6  --    ------------------------------------------------------------------------------------------------------------------ estimated creatinine clearance is 11.2 mL/min (by C-G formula based on Cr of 5.49). ------------------------------------------------------------------------------------------------------------------ No results for input(s): HGBA1C in the last 72 hours. ------------------------------------------------------------------------------------------------------------------ No results for input(s): CHOL, HDL, LDLCALC, TRIG, CHOLHDL, LDLDIRECT in  the last 72 hours. ------------------------------------------------------------------------------------------------------------------ No results for input(s): TSH, T4TOTAL, T3FREE, THYROIDAB in the last 72 hours.  Invalid input(s): FREET3 ------------------------------------------------------------------------------------------------------------------ No results for input(s): VITAMINB12, FOLATE, FERRITIN, TIBC, IRON, RETICCTPCT in the last 72 hours.  Coagulation profile  Recent Labs Lab 04/19/15 0030  INR 1.15    No results for input(s): DDIMER in  the last 72 hours.  Cardiac Enzymes  Recent Labs Lab 04/18/15 2204  TROPONINI 0.56*   ------------------------------------------------------------------------------------------------------------------ Invalid input(s): POCBNP   CBG:  Recent Labs Lab 04/18/15 2048 04/19/15 1427 04/19/15 1550  GLUCAP 98 49* 98       Studies: No results found.    Lab Results  Component Value Date   HGBA1C 4.9 01/30/2015   HGBA1C 4.6* 01/29/2015   Lab Results  Component Value Date   CREATININE 5.49* 04/21/2015       Scheduled Meds: . aspirin EC  81 mg Oral Daily  . atorvastatin  40 mg Oral q1800  . carvedilol  6.25 mg Oral BID WC  . dextrose  1 ampule Intravenous Once  . feeding supplement (NEPRO CARB STEADY)  237 mL Oral BID BM  . feeding supplement (PRO-STAT SUGAR FREE 64)  30 mL Oral BID  . guaiFENesin  600 mg Oral BID  . hydrALAZINE  25 mg Oral 4 times per day  . insulin aspart  10 Units Intravenous Once  . isosorbide mononitrate  30 mg Oral Daily  . multivitamin  1 tablet Oral QHS  . pantoprazole  40 mg Oral Daily  . piperacillin-tazobactam (ZOSYN)  IV  2.25 g Intravenous 3 times per day  . sodium chloride  3 mL Intravenous Q12H  . sodium polystyrene  30 g Oral Once  . vancomycin  750 mg Intravenous Q M,W,F-HD   Continuous Infusions: . heparin 1,850 Units/hr (04/21/15 0730)    Principal Problem:   NSTEMI  (non-ST elevated myocardial infarction) Active Problems:   Pulmonary fibrosis   HCAP (healthcare-associated pneumonia)   Sjogren's disease   DVT (deep venous thrombosis)   ESRD on dialysis   Acute on chronic diastolic CHF (congestive heart failure)   Accelerated hypertension   Protein-calorie malnutrition, severe    Time spent: 45 minutes   [facility] Pager [phone]. If 7PM-7AM, please contact night-coverage at [email or web address], password [facility] 04/21/2015, 10:49 AM  LOS: 3 days

## 2015-04-21 NOTE — Progress Notes (Signed)
Patient Name: Travis [name] Date of Encounter: 04/21/2015     Principal Problem:   NSTEMI (non-ST elevated myocardial infarction) Active Problems:   Pulmonary fibrosis   HCAP (healthcare-associated pneumonia)   Sjogren's disease   DVT (deep venous thrombosis)   ESRD on dialysis   Acute on chronic diastolic CHF (congestive heart failure)   Accelerated hypertension   Protein-calorie malnutrition, severe    SUBJECTIVE  This chest pain or shortness of breath.  He had hemodialysis earlier today.  He is somewhat confused today.  States he wants to go home.  CURRENT MEDS . aspirin EC  81 mg Oral Daily  . atorvastatin  40 mg Oral q1800  . carvedilol  6.25 mg Oral BID WC  . dextrose  1 ampule Intravenous Once  . feeding supplement (NEPRO CARB STEADY)  237 mL Oral BID BM  . feeding supplement (PRO-STAT SUGAR FREE 64)  30 mL Oral BID  . guaiFENesin  600 mg Oral BID  . hydrALAZINE  25 mg Oral 4 times per day  . insulin aspart  10 Units Intravenous Once  . isosorbide mononitrate  30 mg Oral Daily  . multivitamin  1 tablet Oral QHS  . pantoprazole  40 mg Oral Daily  . piperacillin-tazobactam (ZOSYN)  IV  2.25 g Intravenous 3 times per day  . sodium chloride  3 mL Intravenous Q12H  . sodium polystyrene  30 g Oral Once  . vancomycin  750 mg Intravenous Q M,W,F-HD    OBJECTIVE  Filed Vitals:   04/21/15 1130 04/21/15 1200 04/21/15 1230 04/21/15 1253  BP: 116/72 125/69 122/70 125/83  Pulse: 107 107 108 108  Temp:    98 F (36.7 C)  TempSrc:    Oral  Resp:    18  Height:      Weight:    160 lb 15 oz (73 kg)  SpO2:  99% 99% 99%    Intake/Output Summary (Last 24 hours) at 04/21/15 1412 Last data filed at 04/21/15 1253  Gross per 24 hour  Intake 255.13 ml  Output   1850 ml  Net -1594.87 ml   Filed Weights   04/21/15 0630 04/21/15 0853 04/21/15 1253  Weight: 158 lb 11.7 oz (72 kg) 164 lb 7.4 oz (74.6 kg) 160 lb 15 oz (73 kg)    PHYSICAL EXAM  General: Pleasant, NAD,  mildly confused.Marland Kitchen HEENT:  Normal  Neck: Supple without bruits or JVD. Lungs:  Resp regular and unlabored, CTA. Heart: RRR no s3, s4, or murmurs. Abdomen: Soft, non-tender, non-distended, BS + x 4.  Extremities: No clubbing, cyanosis or edema. DP/PT/Radials 2+ and equal bilaterally.  Accessory Clinical Findings  CBC  Recent Labs  04/18/15 1625  04/20/15 1100 04/21/15 0258  WBC 15.1*  < > 14.6* 14.1*  NEUTROABS 13.1*  --   --   --   HGB 14.0  < > 13.8 12.1*  HCT 44.4  < > 44.1 38.2*  MCV 91.4  < > 92.3 90.3  PLT 119*  < > 147* 162  < > = values in this interval not displayed. Basic Metabolic Panel  Recent Labs  04/20/15 1100 04/21/15 0900  NA 138 136  K 4.8 3.7  CL 94* 96*  CO2 28 26  GLUCOSE 91 110*  BUN 23* 34*  CREATININE 4.43* 5.49*  CALCIUM 8.6* 8.2*  PHOS  --  3.9   Liver Function Tests  Recent Labs  04/19/15 0030 04/20/15 1100 04/21/15 0900  AST 107* 50*  --  ALT 31 28  --   ALKPHOS 111 115  --   BILITOT 1.7* 0.6  --   PROT 7.0 6.9  --   ALBUMIN 2.4* 2.2* 1.9*   No results for input(s): LIPASE, AMYLASE in the last 72 hours. Cardiac Enzymes  Recent Labs  04/18/15 2204  TROPONINI 0.56*   BNP Invalid input(s): POCBNP D-Dimer No results for input(s): DDIMER in the last 72 hours. Hemoglobin A1C No results for input(s): HGBA1C in the last 72 hours. Fasting Lipid Panel No results for input(s): CHOL, HDL, LDLCALC, TRIG, CHOLHDL, LDLDIRECT in the last 72 hours. Thyroid Function Tests No results for input(s): TSH, T4TOTAL, T3FREE, THYROIDAB in the last 72 hours.  Invalid input(s): FREET3  TELE  Sinus tachycardia  ECG  Repeat EKG pending  Radiology/Studies  Dg Chest Port 1 View  04/18/2015   CLINICAL DATA:  74 year old male with shortness of breath, tachycardia. Renal failure on dialysis for 3 weeks. Initial encounter.  EXAM: PORTABLE CHEST - 1 VIEW  COMPARISON:  02/17/2015 and earlier.  FINDINGS: Portable AP semi upright view at 1605  hr. Tunneled right chest dialysis catheter is no longer present. Interval decreased but not resolved pleural effusions, residual more apparent on the right. Continued pulmonary vascular congestion, but also improved. Stable cardiac size and mediastinal contours. No pneumothorax. No consolidation.  IMPRESSION: Improved but not resolved pulmonary edema and small pleural effusions since June.  Tunneled right chest dialysis catheter has been removed.   Electronically Signed   By: Genevie Ann M.D.   On: 04/18/2015 16:15    ASSESSMENT AND PLAN 1. New onset renal failure 2. Elevated troponin 3. Acute systolic HF with EF [id number] on preliminary echo 4. Acute respiratory failure 5. Recent DVT s/p IVC filter  6. Acute delirium 7. HTN   Plan: At this point I will stop his IV heparin and continue his aspirin.  I will repeat his EKG to follow up on the previous T-wave abnormalities.  Signed, Darlin Coco MD

## 2015-04-22 ENCOUNTER — Inpatient Hospital Stay (HOSPITAL_COMMUNITY): Payer: Medicare Other

## 2015-04-22 ENCOUNTER — Encounter (HOSPITAL_COMMUNITY): Payer: Self-pay

## 2015-04-22 MED ORDER — AMOXICILLIN-POT CLAVULANATE 500-125 MG PO TABS
1.0000 | ORAL_TABLET | Freq: Two times a day (BID) | ORAL | Status: DC
  Administered 2015-04-22: 500 mg via ORAL
  Filled 2015-04-22 (×3): qty 1

## 2015-04-22 MED ORDER — CARVEDILOL 12.5 MG PO TABS
12.5000 mg | ORAL_TABLET | Freq: Two times a day (BID) | ORAL | Status: DC
  Administered 2015-04-22: 12.5 mg via ORAL
  Filled 2015-04-22 (×3): qty 1

## 2015-04-22 MED ORDER — AMOXICILLIN-POT CLAVULANATE 500-125 MG PO TABS
1.0000 | ORAL_TABLET | Freq: Every day | ORAL | Status: DC
  Administered 2015-04-23 – 2015-04-25 (×3): 500 mg via ORAL
  Filled 2015-04-22 (×5): qty 1

## 2015-04-22 MED ORDER — CETYLPYRIDINIUM CHLORIDE 0.05 % MT LIQD
7.0000 mL | Freq: Two times a day (BID) | OROMUCOSAL | Status: DC
  Administered 2015-04-22 – 2015-04-25 (×7): 7 mL via OROMUCOSAL

## 2015-04-22 NOTE — Progress Notes (Signed)
[facility] Progress Note   Subjective: looks better per family member present, more oriented today   Filed Vitals:   04/22/15 0341 04/22/15 0749 04/22/15 0806 04/22/15 1042  BP: 117/68 131/74 146/74 136/67  Pulse: 98 88 91 91  Temp: 97.9 F (36.6 C)  98 F (36.7 C) 97.9 F (36.6 C)  TempSrc: Oral  Oral Oral  Resp: 18 16 17 18   Height:      Weight: 72 kg (158 lb 11.7 oz)     SpO2: 99% 99% 99% 96%   Exam: No distress, looks a little better No jvd Chest clear bilat RRR no MRG Abd soft ntnd no ascites No LE edema R arm AVG +bruit Neuro is oriented to place and year, name  MWF [facility]  4h  3/2.0 bath 75kg  Heparin none  R arm AVG Aranesp 150/wk + venofer 50/wk       Assessment: 1. SOB/ pulm edema/ vol excess - resolved, cxr clear now and down 3 kg under prior dry wt 2. ESRD - he had TMA felt to be idiopathic, rx'd w steroids/PLEX 7 days w/o improvement and started dialysis in June 2016 3. 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 ANA/ANCA/ UPEP were neg. Was dc'd on pred 20/d to flu with Rheum in clinic.  4. ^WBC - no fevers, work up neg so far.  On empiric abx.  5. Cleora Fleet / EKG changes - not candidate for cath, cardiology following. Heparin stopped 6. H/o DVT s/p IVC filter 02/2015 - not on anticoagulation as outpt 7. 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 Diarrhea - Cdif neg, no further loose stool per staff 10. Nutrition - Alb 2.2 - renal diet/prostat/supplements/ add renal vitamin 11. HTN - on coreg/ hydralazine, BP's good  Plan - HD tomorrow, lower dry wt a bit further    Kelly Splinter MD  pager [phone]    cell [phone]  04/22/2015, 11:20 AM     Recent Labs Lab 04/19/15 1005 04/20/15 1100 04/21/15 0900  NA 135 138 136  K 4.8 4.8 3.7  CL 97* 94* 96*  CO2 22 28 26   GLUCOSE 105* 91 110*  BUN 22* 23* 34*  CREATININE 5.29* 4.43* 5.49*  CALCIUM 8.3* 8.6* 8.2*  PHOS  --   --  3.9    Recent  Labs Lab 04/19/15 0030 04/20/15 1100 04/21/15 0900  AST 107* 50*  --   ALT 31 28  --   ALKPHOS 111 115  --   BILITOT 1.7* 0.6  --   PROT 7.0 6.9  --   ALBUMIN 2.4* 2.2* 1.9*    Recent Labs Lab 04/18/15 1625 04/19/15 1003 04/20/15 1100 04/21/15 0258  WBC 15.1* 13.0* 14.6* 14.1*  NEUTROABS 13.1*  --   --   --   HGB 14.0 12.6* 13.8 12.1*  HCT 44.4 39.0 44.1 38.2*  MCV 91.4 88.6 92.3 90.3  PLT 119* 138* 147* 162   . amoxicillin-clavulanate  1 tablet Oral BID  . aspirin EC  81 mg Oral Daily  . atorvastatin  40 mg Oral q1800  . carvedilol  12.5 mg Oral BID WC  . dextrose  1 ampule Intravenous Once  . feeding supplement (NEPRO CARB STEADY)  237 mL Oral BID BM  . feeding supplement (PRO-STAT SUGAR FREE 64)  30 mL Oral BID  . guaiFENesin  600 mg Oral BID  . heparin subcutaneous  5,000 Units  Subcutaneous 3 times per day  . hydrALAZINE  25 mg Oral 4 times per day  . insulin aspart  10 Units Intravenous Once  . isosorbide mononitrate  30 mg Oral Daily  . multivitamin  1 tablet Oral QHS  . pantoprazole  40 mg Oral Daily  . sodium chloride  3 mL Intravenous Q12H     acetaminophen **OR** acetaminophen, hydrALAZINE, ondansetron **OR** ondansetron (ZOFRAN) IV

## 2015-04-22 NOTE — Progress Notes (Signed)
PT Cancellation Note  Patient Details Name: Travis [name] MRN: [id number] DOB: [birth year]   Cancelled Treatment:    Reason Eval/Treat Not Completed: Medical issues which prohibited therapy (pt with elevated troponins due to MI. Will await trending down of troponins.)   Shaketta Rill 04/22/2015, 2:39 PM

## 2015-04-22 NOTE — Evaluation (Signed)
Clinical/Bedside Swallow Evaluation Patient Details  Name: Travis [name] MRN: [id number] Date of Birth: 20-Dec-1940  Today's Date: 04/22/2015 Time: SLP Start Time (ACUTE ONLY): 1100 SLP Stop Time (ACUTE ONLY): 1112 SLP Time Calculation (min) (ACUTE ONLY): 12 min  Past Medical History:  Past Medical History  Diagnosis Date  . Medical history non-contributory   . Arthritis   . Pneumonia   . GERD (gastroesophageal reflux disease)   . Sjogren's disease 01/28/2015  . Pulmonary fibrosis   . Renal disorder   . Acute on chronic diastolic CHF (congestive heart failure) 04/18/2015  . Accelerated hypertension 04/19/2015   Past Surgical History:  Past Surgical History  Procedure Laterality Date  . Hemorroidectomy  1999  . Surgical procedure to remove a mole as a child Right Eye area    At around 6 years old  . Insertion of dialysis catheter N/A 02/17/2015    Procedure: INSERTION OF DIALYSIS CATHETER RIGHT INTERNAL JUGULAR VEIN;  Surgeon: Elam Dutch, MD;  Location: [facility];  Service: Vascular;  Laterality: N/A;  . Av fistula placement Right 02/17/2015    Procedure: INSERTION OF RIGHT ARM  ARTERIOVENOUS (AV) GORE-TEX GRAFT ;  Surgeon: Elam Dutch, MD;  Location: [facility] OR;  Service: Vascular;  Laterality: Right;   HPI:  Travis [name] is a 74 y.o. gentleman with PMHx of arthritis and GERD. Hospitalized for CAP (4/5-4/14) with repeat admission for HCAP chest CT findings were suggestive of pleural effusions and pulmonary fibrosis, which led to a rheumatological work-up. Readmitted on 02/17/2015 a DVT R leg below the knee.He was found to be in renal failure. 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 CT scan which showed a large perinephric hemorrhage and patient underwent a IVC filter placement. After finishing 7 days of plasmapheresis and a prednisone taper his renal function has not recovered and nephrology started dialysis, patient  underwent left AV fistula placement on 6/6. Admitted several days ago with respiratory failure, NSTEMI. Per chart pt has Sjogren's Disease.    Assessment / Plan / Recommendation Clinical Impression  Pt demonstrates normal swallow function with no complaints or evidence of aspiration or esophageal dysphagia. The pt does report dry mouth, which is expected with Sjogren's disease. SLP discussed  esopahgeal precautoins, oral care and signs of oral candida with pt and family member at bedside. Pt may continue current diet and no SLP f/u needed as education is complete.     Aspiration Risk  Mild    Diet Recommendation Age appropriate regular solids;Thin   Medication Administration: Whole meds with liquid    Other  Recommendations Oral Care Recommendations: Oral care BID   Follow Up Recommendations       Frequency and Duration        Pertinent Vitals/Pain NA    SLP Swallow Goals     Swallow Study Prior Functional Status       General Other Pertinent Information: Travis [name] is a 74 y.o. gentleman with PMHx of arthritis and GERD. Hospitalized for CAP (4/5-4/14) with repeat admission for HCAP chest CT findings were suggestive of pleural effusions and pulmonary fibrosis, which led to a rheumatological work-up. Readmitted on 02/17/2015 a DVT R leg below the knee.He was found to be in renal failure. 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 CT scan which showed a large perinephric hemorrhage and patient underwent a IVC filter placement. After finishing 7 days of plasmapheresis  and a prednisone taper his renal function has not recovered and nephrology started dialysis, patient underwent left AV fistula placement on 6/6. Admitted several days ago with respiratory failure, NSTEMI. Per chart pt has Sjogren's Disease.  Type of Study: Bedside swallow evaluation Previous Swallow Assessment: none Diet Prior to this Study: Regular;Thin  liquids Temperature Spikes Noted: No Respiratory Status: Room air History of Recent Intubation: No Behavior/Cognition: Alert;Cooperative Oral Cavity - Dentition:  (top denture) Self-Feeding Abilities: Needs assist Patient Positioning: Upright in bed Baseline Vocal Quality: Normal Volitional Cough: Strong Volitional Swallow: Able to elicit    Oral/Motor/Sensory Function Overall Oral Motor/Sensory Function: Appears within functional limits for tasks assessed   Ice Chips     Thin Liquid Thin Liquid: Within functional limits Presentation: Cup;Straw;Self Fed    Nectar Thick Nectar Thick Liquid: Not tested   Honey Thick Honey Thick Liquid: Not tested   Puree Puree: Within functional limits   Solid   GO    Solid: Within functional limits      [facility], [place] CCC-SLP [id number]  Lynann Beaver 04/22/2015,2:14 PM

## 2015-04-22 NOTE — Progress Notes (Signed)
Patient Name: Travis [name] Date of Encounter: 04/22/2015     Principal Problem:   NSTEMI (non-ST elevated myocardial infarction) Active Problems:   Pulmonary fibrosis   HCAP (healthcare-associated pneumonia)   Sjogren's disease   DVT (deep venous thrombosis)   ESRD on dialysis   Acute on chronic diastolic CHF (congestive heart failure)   Accelerated hypertension   Protein-calorie malnutrition, severe    SUBJECTIVE  The patient has remained stable overnight.  He remains in normal sinus rhythm.  Denies chest pain.  CURRENT MEDS . amoxicillin-clavulanate  1 tablet Oral BID  . aspirin EC  81 mg Oral Daily  . atorvastatin  40 mg Oral q1800  . carvedilol  12.5 mg Oral BID WC  . dextrose  1 ampule Intravenous Once  . feeding supplement (NEPRO CARB STEADY)  237 mL Oral BID BM  . feeding supplement (PRO-STAT SUGAR FREE 64)  30 mL Oral BID  . guaiFENesin  600 mg Oral BID  . heparin subcutaneous  5,000 Units Subcutaneous 3 times per day  . hydrALAZINE  25 mg Oral 4 times per day  . insulin aspart  10 Units Intravenous Once  . isosorbide mononitrate  30 mg Oral Daily  . multivitamin  1 tablet Oral QHS  . pantoprazole  40 mg Oral Daily  . sodium chloride  3 mL Intravenous Q12H    OBJECTIVE  Filed Vitals:   04/22/15 0341 04/22/15 0749 04/22/15 0806 04/22/15 1042  BP: 117/68 131/74 146/74 136/67  Pulse: 98 88 91 91  Temp: 97.9 F (36.6 C)  98 F (36.7 C) 97.9 F (36.6 C)  TempSrc: Oral  Oral Oral  Resp: 18 16 17 18   Height:      Weight: 158 lb 11.7 oz (72 kg)     SpO2: 99% 99% 99% 96%    Intake/Output Summary (Last 24 hours) at 04/22/15 1131 Last data filed at 04/22/15 0909  Gross per 24 hour  Intake    500 ml  Output   1850 ml  Net  -1350 ml   Filed Weights   04/21/15 0853 04/21/15 1253 04/22/15 0341  Weight: 164 lb 7.4 oz (74.6 kg) 160 lb 15 oz (73 kg) 158 lb 11.7 oz (72 kg)    PHYSICAL EXAM  General: Pleasant, NAD. Neuro: He appears to be less confused  than yesterday Psych: Normal affect. HEENT:  Normal  Neck: Supple without bruits or JVD. Lungs:  Resp regular and unlabored, CTA. Heart: RRR no s3, s4, or murmurs. Abdomen: Soft, non-tender, non-distended, BS + x 4.  Extremities: No clubbing, cyanosis or edema. DP/PT/Radials 2+ and equal bilaterally.  Accessory Clinical Findings  CBC  Recent Labs  04/20/15 1100 04/21/15 0258  WBC 14.6* 14.1*  HGB 13.8 12.1*  HCT 44.1 38.2*  MCV 92.3 90.3  PLT 147* 0000000   Basic Metabolic Panel  Recent Labs  04/20/15 1100 04/21/15 0900  NA 138 136  K 4.8 3.7  CL 94* 96*  CO2 28 26  GLUCOSE 91 110*  BUN 23* 34*  CREATININE 4.43* 5.49*  CALCIUM 8.6* 8.2*  PHOS  --  3.9   Liver Function Tests  Recent Labs  04/20/15 1100 04/21/15 0900  AST 50*  --   ALT 28  --   ALKPHOS 115  --   BILITOT 0.6  --   PROT 6.9  --   ALBUMIN 2.2* 1.9*   No results for input(s): LIPASE, AMYLASE in the last 72 hours. Cardiac Enzymes  Recent Labs  04/21/15 1535  TROPONINI 0.54*   BNP Invalid input(s): POCBNP D-Dimer No results for input(s): DDIMER in the last 72 hours. Hemoglobin A1C No results for input(s): HGBA1C in the last 72 hours. Fasting Lipid Panel No results for input(s): CHOL, HDL, LDLCALC, TRIG, CHOLHDL, LDLDIRECT in the last 72 hours. Thyroid Function Tests No results for input(s): TSH, T4TOTAL, T3FREE, THYROIDAB in the last 72 hours.  Invalid input(s): FREET3  TELE  Normal sinus rhythm  ECG  EKG yesterday shows persistent inferolateral T-wave changes.  Radiology/Studies  Dg Chest Port 1 View  04/22/2015   CLINICAL DATA:  Cough, chronic renal insufficiency  EXAM: PORTABLE CHEST - 1 VIEW  COMPARISON:  Chest x-ray of April 18, 2015  FINDINGS: The lungs are well-expanded. The interstitial markings are markedly improved. There is small right pleural effusion. The heart is mildly enlarged. The pulmonary vascularity is less engorged. The mediastinum is normal in width. The  bony thorax exhibits no acute abnormality.  IMPRESSION: Continued improvement in pulmonary edema. There is a persistent small right pleural effusion.   Electronically Signed   By: David  Martinique M.D.   On: 04/22/2015 09:01   Dg Chest Port 1 View  04/18/2015   CLINICAL DATA:  74 year old male with shortness of breath, tachycardia. Renal failure on dialysis for 3 weeks. Initial encounter.  EXAM: PORTABLE CHEST - 1 VIEW  COMPARISON:  02/17/2015 and earlier.  FINDINGS: Portable AP semi upright view at 1605 hr. Tunneled right chest dialysis catheter is no longer present. Interval decreased but not resolved pleural effusions, residual more apparent on the right. Continued pulmonary vascular congestion, but also improved. Stable cardiac size and mediastinal contours. No pneumothorax. No consolidation.  IMPRESSION: Improved but not resolved pulmonary edema and small pleural effusions since June.  Tunneled right chest dialysis catheter has been removed.   Electronically Signed   By: Genevie Ann M.D.   On: 04/18/2015 16:15    ASSESSMENT AND PLAN 1. New onset renal failure 2. Elevated troponin 3. Acute systolic HF with EF [id number] on preliminary echo.  Chest x-ray today shows significant improvement in CHF 4. Acute respiratory failure, improved 5. Recent DVT s/p IVC filter  6. Acute delirium, improved 7. HTN   Plan: Blood pressure is still running a little high.  We will increase his carvedilol up to 12.5 mg twice a day.   Signed, Warren Danes MD

## 2015-04-22 NOTE — Progress Notes (Addendum)
[facility] PROGRESS NOTE  Travis [name] [id number] DOB: Jan 30, 1941 DOA: 04/18/2015 PCP: [name], MD  Assessment/Plan: Principal Problem:   NSTEMI (non-ST elevated myocardial infarction) Active Problems:   Pulmonary fibrosis   HCAP (healthcare-associated pneumonia)   Sjogren's disease   DVT (deep venous thrombosis)   ESRD on dialysis   Acute on chronic diastolic CHF (congestive heart failure)   Accelerated hypertension   Protein-calorie malnutrition, severe     1. NSTEMI (non-ST elevated myocardial infarction), 2-D echo shows EF of 35-40%, no regional wall motion abnormalities, Patient is presented with chest pain as well as progressively worsening shortness of breath likely secondary to acute on chronic heart failure. Cardiology consulted for elevated troponin, at this time they recommend medical management, heparin drip discontinued As per cardiology With comorbidities he is not a candidate for cardiac cath as per cardiology. If remains stable overnight can consider Myoview early this week for further risk stratification versus medical therapy. Venous Doppler negative, low suspicion for PE   2. Acute on chronic diastolic heart failure likely secondary to volume overload in the setting of ESRD 2-2 1/2 L via hemodialysis, Next HD 8/10.  Continue  Imdur and hydralazine    3. Accelerated hypertension.  Continue Coreg, by mouth hydralazine, imdur,  4. Possible healthcare related pneumonia with pulmonary fibrosis. Discussed with the critical care, diffuse pulmonary fibrosis component is very mild. At present the patient appears to be having cough with expectoration as well as leukocytosis. Discontinue vancomycin and Zosyn  for healthcare associated pneumonia and switched to Augmentin for possible aspiration, SLP consult to rule out aspiration   5. Anxiety. Patient is also confused, wife is by the bedside and she states that at baseline the patient is not  confused. Likely metabolic encephalopathy. Order CT head without contrast to rule out CVA   .  6. 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 Nephrology following  7. H/o DVT s/p IVC filter 02/2015 - not on anticoagulation as outpt, venous Doppler negative  8 diarrhea-. C. difficile negative, DC enteric precautions   Code Status:      Code Status Orders        Start     Ordered   04/18/15 2302  Full code   Continuous     04/18/15 2307     Family Communication: family updated about patient's clinical progress Disposition Plan: Transfer to telemetry, PT eval   Brief narrative: 74 y.o. 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 Other problems include history of right DVT with prior IVC filter, Sjogrens, hypertension, past admissions for PNA and notes indicate pulmonary fibrosis as well. He is on dialysis MWF at [facility]. He went to dialysis yesterday for his regular treatment, which was aborted after 2 hours and 40 minutes due to progressive dyspnea and tachycardia (and did not therefore achieve his EDW of 75 - got to 76.2) .   He was seen in the ED for progressive dyspnea, was hypoxemia with O2sats of 89%, was tachycardic (115-120) and hypertensive (160's/100's). Was also complaining of a rash of about 10 days duration. Chest x ray showed vascular congestion. Troponin was elevated at 0.68. Had new anterolateral changes on ECG with question of new ischemia. Was felt to have acute diastolic CHF.   Lactic acid elevated at 7.2, WBC elevated  at 15,000 and ATB's have been started.    Consultants:  Nephrology  Cardiology  Procedures:  None  Antibiotics: Anti-infectives    Start     Dose/Rate Route Frequency Ordered Stop   04/21/15 1200  vancomycin  (VANCOCIN) IVPB 1000 mg/200 mL premix  Status:  Discontinued     1,000 mg 200 mL/hr over 60 Minutes Intravenous Every M-W-F (Hemodialysis) 04/18/15 2318 04/19/15 0833   04/21/15 1200  vancomycin (VANCOCIN) IVPB 750 mg/150 ml premix     750 mg 150 mL/hr over 60 Minutes Intravenous Every M-W-F (Hemodialysis) 04/19/15 0833     04/19/15 1200  vancomycin (VANCOCIN) 750 mg in sodium chloride 0.9 % 150 mL IVPB     750 mg 150 mL/hr over 60 Minutes Intravenous  Once 04/19/15 1158 04/19/15 1300   04/18/15 2330  piperacillin-tazobactam (ZOSYN) IVPB 2.25 g     2.25 g 100 mL/hr over 30 Minutes Intravenous 3 times per day 04/18/15 2318     04/18/15 2330  vancomycin (VANCOCIN) 2,000 mg in sodium chloride 0.9 % 500 mL IVPB     2,000 mg 250 mL/hr over 120 Minutes Intravenous  Once 04/18/15 2318 04/19/15 0254         HPI/Subjective: Patient has had some diarrhea overnight, denies any chest pain or shortness of breath, afebrile overnight, diarrhea improving  Objective: Filed Vitals:   04/22/15 0341 04/22/15 0749 04/22/15 0806 04/22/15 1042  BP: 117/68 131/74 146/74 136/67  Pulse: 98 88 91 91  Temp: 97.9 F (36.6 C)  98 F (36.7 C) 97.9 F (36.6 C)  TempSrc: Oral  Oral Oral  Resp: 18 16 17 18   Height:      Weight: 72 kg (158 lb 11.7 oz)     SpO2: 99% 99% 99% 96%    Intake/Output Summary (Last 24 hours) at 04/22/15 1055 Last data filed at 04/22/15 0909  Gross per 24 hour  Intake    500 ml  Output   1850 ml  Net  -1350 ml    Exam:  General: No acute respiratory distress Lungs: Clear to auscultation bilaterally without wheezes or crackles Cardiovascular: Regular rate and rhythm without murmur gallop or rub normal S1 and S2 Abdomen: Nontender, nondistended, soft, bowel sounds positive, no rebound, no ascites, no appreciable mass Extremities: No significant cyanosis, clubbing, or edema bilateral lower extremities     Data Review   Micro Results Recent Results (from the past 240  hour(s))  MRSA PCR Screening     Status: None   Collection Time: 04/18/15 10:19 PM  Result Value Ref Range Status   MRSA by PCR NEGATIVE NEGATIVE Final    Comment:        The GeneXpert MRSA Assay (FDA approved for NASAL specimens only), is one component of a comprehensive MRSA colonization surveillance program. It is not intended to diagnose MRSA infection nor to guide or monitor treatment for MRSA infections.   Culture, blood (x 2)     Status: None (Preliminary result)   Collection Time: 04/19/15  1:12 AM  Result Value Ref Range Status   Specimen Description BLOOD LEFT HAND  Final   Special Requests IN PEDIATRIC BOTTLE 2CC  Final   Culture NO GROWTH 2 DAYS  Final   Report Status PENDING  Incomplete  Culture, blood (routine x 2)     Status: None (Preliminary result)   Collection Time: 04/19/15  8:50 AM  Result Value Ref Range Status   Specimen Description BLOOD HEMODIALYSIS CATHETER  Final  Special Requests BOTTLES DRAWN AEROBIC AND ANAEROBIC 10CC  Final   Culture NO GROWTH 2 DAYS  Final   Report Status PENDING  Incomplete  Culture, blood (routine x 2)     Status: None (Preliminary result)   Collection Time: 04/19/15  9:05 AM  Result Value Ref Range Status   Specimen Description BLOOD HEMODIALYSIS CATHETER  Final   Special Requests BOTTLES DRAWN AEROBIC AND ANAEROBIC 10CC  Final   Culture NO GROWTH 2 DAYS  Final   Report Status PENDING  Incomplete  C difficile quick scan w PCR reflex     Status: None   Collection Time: 04/20/15  8:40 AM  Result Value Ref Range Status   C Diff antigen NEGATIVE NEGATIVE Final   C Diff toxin NEGATIVE NEGATIVE Final   C Diff interpretation Negative for toxigenic C. difficile  Final    Radiology Reports Dg Chest Port 1 View  04/22/2015   CLINICAL DATA:  Cough, chronic renal insufficiency  EXAM: PORTABLE CHEST - 1 VIEW  COMPARISON:  Chest x-ray of April 18, 2015  FINDINGS: The lungs are well-expanded. The interstitial markings are markedly  improved. There is small right pleural effusion. The heart is mildly enlarged. The pulmonary vascularity is less engorged. The mediastinum is normal in width. The bony thorax exhibits no acute abnormality.  IMPRESSION: Continued improvement in pulmonary edema. There is a persistent small right pleural effusion.   Electronically Signed   By: David  Martinique M.D.   On: 04/22/2015 09:01   Dg Chest Port 1 View  04/18/2015   CLINICAL DATA:  74 year old male with shortness of breath, tachycardia. Renal failure on dialysis for 3 weeks. Initial encounter.  EXAM: PORTABLE CHEST - 1 VIEW  COMPARISON:  02/17/2015 and earlier.  FINDINGS: Portable AP semi upright view at 1605 hr. Tunneled right chest dialysis catheter is no longer present. Interval decreased but not resolved pleural effusions, residual more apparent on the right. Continued pulmonary vascular congestion, but also improved. Stable cardiac size and mediastinal contours. No pneumothorax. No consolidation.  IMPRESSION: Improved but not resolved pulmonary edema and small pleural effusions since June.  Tunneled right chest dialysis catheter has been removed.   Electronically Signed   By: Genevie Ann M.D.   On: 04/18/2015 16:15     CBC  Recent Labs Lab 04/18/15 1625 04/19/15 1003 04/20/15 1100 04/21/15 0258  WBC 15.1* 13.0* 14.6* 14.1*  HGB 14.0 12.6* 13.8 12.1*  HCT 44.4 39.0 44.1 38.2*  PLT 119* 138* 147* 162  MCV 91.4 88.6 92.3 90.3  MCH 28.8 28.6 28.9 28.6  MCHC 31.5 32.3 31.3 31.7  RDW 20.4* 20.3* 21.3* 21.3*  LYMPHSABS 0.4*  --   --   --   MONOABS 1.6*  --   --   --   EOSABS 0.0  --   --   --   BASOSABS 0.1  --   --   --     Chemistries   Recent Labs Lab 04/18/15 1625 04/19/15 0030 04/19/15 1005 04/20/15 1100 04/21/15 0900  NA 136 135 135 138 136  K 5.7* >7.5* 4.8 4.8 3.7  CL 96* 96* 97* 94* 96*  CO2 29 17* 22 28 26   GLUCOSE 117* 102* 105* 91 110*  BUN 11 18 22* 23* 34*  CREATININE 4.32* 4.98* 5.29* 4.43* 5.49*  CALCIUM 8.3*  8.4* 8.3* 8.6* 8.2*  AST  --  107*  --  50*  --   ALT  --  31  --  28  --   ALKPHOS  --  111  --  115  --   BILITOT  --  1.7*  --  0.6  --    ------------------------------------------------------------------------------------------------------------------ estimated creatinine clearance is 11.2 mL/min (by C-G formula based on Cr of 5.49). ------------------------------------------------------------------------------------------------------------------ No results for input(s): HGBA1C in the last 72 hours. ------------------------------------------------------------------------------------------------------------------ No results for input(s): CHOL, HDL, LDLCALC, TRIG, CHOLHDL, LDLDIRECT in the last 72 hours. ------------------------------------------------------------------------------------------------------------------ No results for input(s): TSH, T4TOTAL, T3FREE, THYROIDAB in the last 72 hours.  Invalid input(s): FREET3 ------------------------------------------------------------------------------------------------------------------ No results for input(s): VITAMINB12, FOLATE, FERRITIN, TIBC, IRON, RETICCTPCT in the last 72 hours.  Coagulation profile  Recent Labs Lab 04/19/15 0030  INR 1.15    No results for input(s): DDIMER in the last 72 hours.  Cardiac Enzymes  Recent Labs Lab 04/18/15 2204 04/21/15 1535  TROPONINI 0.56* 0.54*   ------------------------------------------------------------------------------------------------------------------ Invalid input(s): POCBNP   CBG:  Recent Labs Lab 04/18/15 2048 04/19/15 1427 04/19/15 1550  GLUCAP 98 49* 98       Studies: Dg Chest Port 1 View  04/22/2015   CLINICAL DATA:  Cough, chronic renal insufficiency  EXAM: PORTABLE CHEST - 1 VIEW  COMPARISON:  Chest x-ray of April 18, 2015  FINDINGS: The lungs are well-expanded. The interstitial markings are markedly improved. There is small right pleural effusion. The  heart is mildly enlarged. The pulmonary vascularity is less engorged. The mediastinum is normal in width. The bony thorax exhibits no acute abnormality.  IMPRESSION: Continued improvement in pulmonary edema. There is a persistent small right pleural effusion.   Electronically Signed   By: David  Martinique M.D.   On: 04/22/2015 09:01      Lab Results  Component Value Date   HGBA1C 4.9 01/30/2015   HGBA1C 4.6* 01/29/2015   Lab Results  Component Value Date   CREATININE 5.49* 04/21/2015       Scheduled Meds: . aspirin EC  81 mg Oral Daily  . atorvastatin  40 mg Oral q1800  . carvedilol  12.5 mg Oral BID WC  . dextrose  1 ampule Intravenous Once  . feeding supplement (NEPRO CARB STEADY)  237 mL Oral BID BM  . feeding supplement (PRO-STAT SUGAR FREE 64)  30 mL Oral BID  . guaiFENesin  600 mg Oral BID  . heparin subcutaneous  5,000 Units Subcutaneous 3 times per day  . hydrALAZINE  25 mg Oral 4 times per day  . insulin aspart  10 Units Intravenous Once  . isosorbide mononitrate  30 mg Oral Daily  . multivitamin  1 tablet Oral QHS  . pantoprazole  40 mg Oral Daily  . piperacillin-tazobactam (ZOSYN)  IV  2.25 g Intravenous 3 times per day  . sodium chloride  3 mL Intravenous Q12H  . vancomycin  750 mg Intravenous Q M,W,F-HD   Continuous Infusions:    Principal Problem:   NSTEMI (non-ST elevated myocardial infarction) Active Problems:   Pulmonary fibrosis   HCAP (healthcare-associated pneumonia)   Sjogren's disease   DVT (deep venous thrombosis)   ESRD on dialysis   Acute on chronic diastolic CHF (congestive heart failure)   Accelerated hypertension   Protein-calorie malnutrition, severe    Time spent: 45 minutes   [facility] Pager [phone]. If 7PM-7AM, please contact night-coverage at [email or web address], password [facility] 04/22/2015, 10:55 AM  LOS: 4 days

## 2015-04-23 ENCOUNTER — Encounter (HOSPITAL_COMMUNITY): Payer: Self-pay | Admitting: Nephrology

## 2015-04-23 DIAGNOSIS — M358 Other specified systemic involvement of connective tissue: Secondary | ICD-10-CM

## 2015-04-23 LAB — COMPREHENSIVE METABOLIC PANEL
ALT: 29 U/L (ref 17–63)
ANION GAP: 12 (ref 5–15)
AST: 41 U/L (ref 15–41)
Albumin: 1.9 g/dL — ABNORMAL LOW (ref 3.5–5.0)
Alkaline Phosphatase: 108 U/L (ref 38–126)
BUN: 36 mg/dL — AB (ref 6–20)
CO2: 24 mmol/L (ref 22–32)
Calcium: 8.4 mg/dL — ABNORMAL LOW (ref 8.9–10.3)
Chloride: 97 mmol/L — ABNORMAL LOW (ref 101–111)
Creatinine, Ser: 5.64 mg/dL — ABNORMAL HIGH (ref 0.61–1.24)
GFR calc Af Amer: 10 mL/min — ABNORMAL LOW (ref >60)
GFR calc non Af Amer: 9 mL/min — ABNORMAL LOW (ref >60)
Glucose, Bld: 88 mg/dL (ref 65–99)
Potassium: 4.3 mmol/L (ref 3.5–5.1)
SODIUM: 133 mmol/L — AB (ref 135–145)
TOTAL PROTEIN: 5.9 g/dL — AB (ref 6.5–8.1)
Total Bilirubin: 0.7 mg/dL (ref 0.3–1.2)

## 2015-04-23 LAB — GLUCOSE, CAPILLARY: Glucose-Capillary: 91 mg/dL (ref 65–99)

## 2015-04-23 MED ORDER — SODIUM CHLORIDE 0.9 % IV SOLN: 100.0000 mL | INTRAVENOUS | Status: DC | PRN

## 2015-04-23 MED ORDER — ALBUMIN HUMAN 25 % IV SOLN
12.5000 g | Freq: Once | INTRAVENOUS | Status: AC
  Administered 2015-04-23: 25 g via INTRAVENOUS
  Filled 2015-04-23: qty 50

## 2015-04-23 MED ORDER — CARVEDILOL 12.5 MG PO TABS: 12.5000 mg | ORAL_TABLET | Freq: Two times a day (BID) | ORAL | Status: DC

## 2015-04-23 MED ORDER — ALBUMIN HUMAN 25 % IV SOLN
INTRAVENOUS | Status: AC
  Administered 2015-04-23: 16:00:00
  Filled 2015-04-23: qty 50

## 2015-04-23 MED ORDER — NEPRO/CARBSTEADY PO LIQD: 237.0000 mL | Freq: Two times a day (BID) | ORAL | Status: DC

## 2015-04-23 MED ORDER — ATORVASTATIN CALCIUM 40 MG PO TABS: 40.0000 mg | ORAL_TABLET | Freq: Every day | ORAL | Status: DC

## 2015-04-23 MED ORDER — HEPARIN SODIUM (PORCINE) 1000 UNIT/ML DIALYSIS
1000.0000 [IU] | INTRAMUSCULAR | Status: DC | PRN
  Filled 2015-04-23: qty 1

## 2015-04-23 MED ORDER — TRAMADOL-ACETAMINOPHEN 37.5-325 MG PO TABS: 1.0000 | ORAL_TABLET | ORAL | Status: DC | PRN

## 2015-04-23 MED ORDER — DIPHENHYDRAMINE HCL 25 MG PO CAPS: 25.0000 mg | ORAL_CAPSULE | ORAL | Status: DC | PRN

## 2015-04-23 MED ORDER — ISOSORBIDE MONONITRATE ER 30 MG PO TB24: 30.0000 mg | ORAL_TABLET | Freq: Every day | ORAL | Status: DC

## 2015-04-23 MED ORDER — CARVEDILOL 6.25 MG PO TABS
6.2500 mg | ORAL_TABLET | Freq: Two times a day (BID) | ORAL | Status: DC
  Filled 2015-04-23 (×3): qty 1

## 2015-04-23 MED ORDER — ASPIRIN 81 MG PO TBEC: 81.0000 mg | DELAYED_RELEASE_TABLET | Freq: Every day | ORAL | Status: DC

## 2015-04-23 MED ORDER — LIDOCAINE-PRILOCAINE 2.5-2.5 % EX CREA: 1.0000 "application " | TOPICAL_CREAM | CUTANEOUS | Status: DC | PRN

## 2015-04-23 MED ORDER — BIOTENE DRY MOUTH MT LIQD: 15.0000 mL | OROMUCOSAL | Status: DC | PRN

## 2015-04-23 MED ORDER — LIDOCAINE HCL (PF) 1 % IJ SOLN: 5.0000 mL | INTRAMUSCULAR | Status: DC | PRN

## 2015-04-23 MED ORDER — AMOXICILLIN-POT CLAVULANATE 500-125 MG PO TABS: 1.0000 | ORAL_TABLET | Freq: Every day | ORAL | Status: DC

## 2015-04-23 MED ORDER — NEPRO/CARBSTEADY PO LIQD
237.0000 mL | ORAL | Status: DC | PRN
  Filled 2015-04-23: qty 237

## 2015-04-23 MED ORDER — CARVEDILOL 6.25 MG PO TABS
6.2500 mg | ORAL_TABLET | Freq: Two times a day (BID) | ORAL | Status: DC
  Filled 2015-04-23 (×2): qty 1

## 2015-04-23 MED ORDER — PENTAFLUOROPROP-TETRAFLUOROETH EX AERO: 1.0000 "application " | INHALATION_SPRAY | CUTANEOUS | Status: DC | PRN

## 2015-04-23 MED ORDER — CARVEDILOL 6.25 MG PO TABS: 6.2500 mg | ORAL_TABLET | Freq: Two times a day (BID) | ORAL | Status: DC

## 2015-04-23 MED ORDER — ARTIFICIAL TEARS OP OINT
TOPICAL_OINTMENT | Freq: Every evening | OPHTHALMIC | Status: DC | PRN
  Filled 2015-04-23: qty 3.5

## 2015-04-23 MED ORDER — ALTEPLASE 2 MG IJ SOLR
2.0000 mg | Freq: Once | INTRAMUSCULAR | Status: DC | PRN
  Filled 2015-04-23: qty 2

## 2015-04-23 MED ORDER — GUAIFENESIN ER 600 MG PO TB12: 600.0000 mg | ORAL_TABLET | Freq: Two times a day (BID) | ORAL | Status: DC

## 2015-04-23 MED ORDER — AMMONIUM LACTATE 12 % EX LOTN
TOPICAL_LOTION | Freq: Three times a day (TID) | CUTANEOUS | Status: DC
  Administered 2015-04-23: 1 via TOPICAL
  Administered 2015-04-23: 21:00:00 via TOPICAL
  Administered 2015-04-24: 1 via TOPICAL
  Administered 2015-04-24: 16:00:00 via TOPICAL
  Administered 2015-04-24 – 2015-04-25 (×3): 1 via TOPICAL
  Filled 2015-04-23 (×2): qty 400

## 2015-04-23 MED ORDER — HYDRALAZINE HCL 25 MG PO TABS: 25.0000 mg | ORAL_TABLET | Freq: Four times a day (QID) | ORAL | Status: DC

## 2015-04-23 MED ORDER — LANOLIN HYDROUS EX OINT
TOPICAL_OINTMENT | CUTANEOUS | Status: DC | PRN
  Administered 2015-04-23 – 2015-04-24 (×4): via TOPICAL
  Filled 2015-04-23: qty 28

## 2015-04-23 NOTE — Progress Notes (Signed)
Patient Name: Travis [name] Date of Encounter: 04/23/2015     Principal Problem:   NSTEMI (non-ST elevated myocardial infarction) Active Problems:   Pulmonary fibrosis   HCAP (healthcare-associated pneumonia)   Sjogren's disease   DVT (deep venous thrombosis)   ESRD on dialysis   Acute on chronic diastolic CHF (congestive heart failure)   Accelerated hypertension   Protein-calorie malnutrition, severe    SUBJECTIVE  Patient presently seen in dialysis. Lethargic at present. BP running low on dialysis.  CURRENT MEDS . amoxicillin-clavulanate  1 tablet Oral Q2000  . antiseptic oral rinse  7 mL Mouth Rinse BID  . aspirin EC  81 mg Oral Daily  . atorvastatin  40 mg Oral q1800  . carvedilol  12.5 mg Oral BID WC  . dextrose  1 ampule Intravenous Once  . feeding supplement (NEPRO CARB STEADY)  237 mL Oral BID BM  . feeding supplement (PRO-STAT SUGAR FREE 64)  30 mL Oral BID  . guaiFENesin  600 mg Oral BID  . heparin subcutaneous  5,000 Units Subcutaneous 3 times per day  . hydrALAZINE  25 mg Oral 4 times per day  . insulin aspart  10 Units Intravenous Once  . isosorbide mononitrate  30 mg Oral Daily  . multivitamin  1 tablet Oral QHS  . pantoprazole  40 mg Oral Daily  . sodium chloride  3 mL Intravenous Q12H    OBJECTIVE  Filed Vitals:   04/23/15 0427 04/23/15 0500 04/23/15 0553 04/23/15 0840  BP: 123/60  150/68 146/76  Pulse:   87 92  Temp: 98.8 F (37.1 C)  98.1 F (36.7 C) 96.1 F (35.6 C)  TempSrc: Oral  Oral Oral  Resp: 17     Height:      Weight: 160 lb 0.9 oz (72.6 kg) 163 lb 2.3 oz (74 kg)  161 lb 13.1 oz (73.4 kg)  SpO2: 98%  100% 92%    Intake/Output Summary (Last 24 hours) at 04/23/15 0858 Last data filed at 04/22/15 2200  Gross per 24 hour  Intake    740 ml  Output      2 ml  Net    738 ml   Filed Weights   04/23/15 0427 04/23/15 0500 04/23/15 0840  Weight: 160 lb 0.9 oz (72.6 kg) 163 lb 2.3 oz (74 kg) 161 lb 13.1 oz (73.4 kg)    PHYSICAL  EXAM   Lungs:  Resp regular and unlabored, CTA. Heart: RRR no s3, s4, or murmurs. Abdomen: Soft, non-tender, non-distended, BS + x 4.  Extremities: No clubbing, cyanosis or edema. DP/PT/Radials 2+ and equal bilaterally.  Accessory Clinical Findings  CBC  Recent Labs  04/20/15 1100 04/21/15 0258  WBC 14.6* 14.1*  HGB 13.8 12.1*  HCT 44.1 38.2*  MCV 92.3 90.3  PLT 147* 0000000   Basic Metabolic Panel  Recent Labs  04/21/15 0900 04/23/15 0230  NA 136 133*  K 3.7 4.3  CL 96* 97*  CO2 26 24  GLUCOSE 110* 88  BUN 34* 36*  CREATININE 5.49* 5.64*  CALCIUM 8.2* 8.4*  PHOS 3.9  --    Liver Function Tests  Recent Labs  04/20/15 1100 04/21/15 0900 04/23/15 0230  AST 50*  --  41  ALT 28  --  29  ALKPHOS 115  --  108  BILITOT 0.6  --  0.7  PROT 6.9  --  5.9*  ALBUMIN 2.2* 1.9* 1.9*   No results for input(s): LIPASE, AMYLASE in the last 72  hours. Cardiac Enzymes  Recent Labs  04/21/15 1535  TROPONINI 0.54*   BNP Invalid input(s): POCBNP D-Dimer No results for input(s): DDIMER in the last 72 hours. Hemoglobin A1C No results for input(s): HGBA1C in the last 72 hours. Fasting Lipid Panel No results for input(s): CHOL, HDL, LDLCALC, TRIG, CHOLHDL, LDLDIRECT in the last 72 hours. Thyroid Function Tests No results for input(s): TSH, T4TOTAL, T3FREE, THYROIDAB in the last 72 hours.  Invalid input(s): FREET3  TELE  NSR  ECG    Radiology/Studies  Ct Head Wo Contrast  04/22/2015   CLINICAL DATA:  74 year old male with confusion and lethargy.  EXAM: CT HEAD WITHOUT CONTRAST  TECHNIQUE: Contiguous axial images were obtained from the base of the skull through the vertex without intravenous contrast.  COMPARISON:  None.  FINDINGS: Mild -moderate periventricular white matter hypodensities likely represent chronic small-vessel white matter ischemic changes.  No acute intracranial abnormalities are identified, including mass lesion or mass effect, hydrocephalus,  extra-axial fluid collection, midline shift, hemorrhage, or acute infarction.  The visualized bony calvarium is unremarkable.  IMPRESSION: No evidence of acute intracranial abnormality.  Mild-moderate periventricular white matter hypodensities - likely chronic small-vessel white matter ischemic changes.   Electronically Signed   By: Margarette Canada M.D.   On: 04/22/2015 15:34   Dg Chest Port 1 View  04/22/2015   CLINICAL DATA:  Cough, chronic renal insufficiency  EXAM: PORTABLE CHEST - 1 VIEW  COMPARISON:  Chest x-ray of April 18, 2015  FINDINGS: The lungs are well-expanded. The interstitial markings are markedly improved. There is small right pleural effusion. The heart is mildly enlarged. The pulmonary vascularity is less engorged. The mediastinum is normal in width. The bony thorax exhibits no acute abnormality.  IMPRESSION: Continued improvement in pulmonary edema. There is a persistent small right pleural effusion.   Electronically Signed   By: David  Martinique M.D.   On: 04/22/2015 09:01   Dg Chest Port 1 View  04/18/2015   CLINICAL DATA:  74 year old male with shortness of breath, tachycardia. Renal failure on dialysis for 3 weeks. Initial encounter.  EXAM: PORTABLE CHEST - 1 VIEW  COMPARISON:  02/17/2015 and earlier.  FINDINGS: Portable AP semi upright view at 1605 hr. Tunneled right chest dialysis catheter is no longer present. Interval decreased but not resolved pleural effusions, residual more apparent on the right. Continued pulmonary vascular congestion, but also improved. Stable cardiac size and mediastinal contours. No pneumothorax. No consolidation.  IMPRESSION: Improved but not resolved pulmonary edema and small pleural effusions since June.  Tunneled right chest dialysis catheter has been removed.   Electronically Signed   By: Genevie Ann M.D.   On: 04/18/2015 16:15    ASSESSMENT AND PLAN 1. New onset renal failure 2. Elevated troponin 3. Acute systolic HF with EF [id number] on preliminary echo. Chest  x-ray  shows significant improvement in CHF 4. Acute respiratory failure, improved 5. Recent DVT s/p IVC filter  6. Acute delirium, improved 7. HTN.  Low BP during HD.   Plan:  Will decrease carvedilol back to 6.25 mb BID Okay to proceed with physical therapy and ambulation. Anticipate discharge soon. Signed, Warren Danes MD

## 2015-04-23 NOTE — Care Management Important Message (Signed)
Important Message  Patient Details  Name: Travis [name] MRN: [id number] Date of Birth: 08-Sep-1941   Medicare Important Message Given:  [facility] notification given    Nathen May 04/23/2015, 11:55 AMImportant Message  Patient Details  Name: Travis [name] MRN: [id number] Date of Birth: Jan 06, 1941   Medicare Important Message Given:  Yes-second notification given    Nathen May 04/23/2015, 11:55 AM

## 2015-04-23 NOTE — Consult Note (Signed)
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 He had rapid progression of his renal failure and is now requiring hemodialysis- kidney biopsy showed acute thrombotic microangiopathy. His disease progression is also included the formation of a DVT and he has an IVC filter in place-he is currently on heparin for admission this hospitalization for a non-ST elevation MI- he was previously not on anticoagulation due to a post renal biopsy capsular hemorrhage-he is a present severely deconditioned and has worsening of his skin and tissue manifestations of his autoimmune disease. He has had issues with malignant hypertension and was started on a host of blood pressure medications-those of been discontinued due to difficulty with hemodialysis and hypotension. Overall he is declined since his initial diagnosis in April.   I met with his family and the patient at bedside today to discuss his overall goals of care and also to begin to discuss some of the difficult prognostication involved with this current illness. 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   Mr.  [name] is getting increasingly tired, he is grieving his prior loss of function. He tells me that he just wants to go home, he understands that he will need outpatient dialysis unless he elects to not continue dialysis which would shorten his life. He has a very strong will to live but he tells me he is increasingly growing tired of his current existence- he is starting to feel that he will not get better-he is disappointed, and clearly saddened by his current situation. I met with his wife Travis [name] his sign his daughter and his lifelong best friend- I encouraged them to hope for the best but also began making a plan should things not go well for him. They asked me specific prognostic information- I made my best guess and told them how difficult it was to give them accurate information on a prognosis in terms of how much time Travis [name] had that based on my current assessment of him his current functional status I do believe he could have less than 6 months at best a year and of course possibly much much less than this should he decline in the face of an acute event. I encouraged him to think very closely about his advanced directives, his CODE STATUS and what kinds of things Travis [name] would want should his health acutely or dramatically decline including mechanical ventilation, feeding tubes, sustained on IV and artificial nutrition. They tell me they have not consider these things and were not prepared to hear the information they received today but at the same time or very grateful for my honesty and also for the straightforward information and the fact that we have very few treatment options at present they can actually improve Mr.  [name] current condition and I do anticipate a continued decline.  Contacts/Participants in Discussion: Primary Decision Maker: Patient's wife Travis [name] is his N okay decision-maker HCPOA: no Sun and daughter, and brother-in-law/best friend were present in the room  Code  Status/Advance Care Planning:  Remains full code I have asked them to consider DO NOT RESUSCITATE as a reasonable medical option given his current deconditioned state in the absence of a therapy or intervention that could actually reverse his current poor condition.  Symptom Management:   Itching: This is his main complaint is his skin is becoming increasingly dry, a leather-like consistency he has itching keeps him awake at night and is becoming intolerable. I have ordered a combination of Lac-Hydrin, lanolin and also have written him for by mouth and IV Benadryl.  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  Deconditioning: Patient is awaiting an evaluation by physical therapy  Palliative Prophylaxis: Loperamide for diarrhea, Lacri-Lube for eye dryness, Benadryl when necessary for itching  Additional Recommendations (Limitations, Scope, Preferences):  Travis [name] would like some resolution on his diagnosis and the most specific information possible on his prognosis and what his future disease trajectory may look like he and his family are interested in obtaining a formal rheumatology consultation and advice he has had several appointments scheduled rheumatology however due to hospitalizations he has been able unable to make the outpatient appointments I would recommend consideration of an inpatient rheumatology consultation if that cannot be provided here, and the family may like to consider transfer to a tertiary care facility.  Travis [name] is a [facility] patient and he is a [employer] veteran who thought in [place] he has a service connection to the [place] for agent orange exposure  Psycho-social/Spiritual:   Support System: Strong  Desire for further Chaplaincy support:yes  Prognosis: < 6 months  Discharge Planning:  The patient will likely need to be discharged home  with home health PT, they do not at this point think that he would do well with home rehabilitation facility discharge will need a significant amount of DME including a hospital bed and bedside commode shower chair and any other equipment necessary to care for somebody he is going to be mostly bedbound. They will needed transport wheelchair and family is aware that they would have to transport him to hemodialysis       Chief Complaint/History of Present Illness: as above  Primary Diagnoses  Present on Admission:  . NSTEMI (non-ST elevated myocardial infarction) . Acute on chronic diastolic CHF (congestive heart failure) . Sjogren's disease . Pulmonary fibrosis . HCAP (healthcare-associated pneumonia) . DVT (deep venous thrombosis) . Accelerated hypertension  Palliative Review of Systems: Positive for itching, diarrhea, generalized weakness, intermittent nausea, syncope during hemodialysis, severe fatigue I have reviewed the medical record, interviewed the patient and family, and examined the patient. The following aspects are pertinent.  Past Medical History  Diagnosis Date  . Medical history non-contributory   . Arthritis   . Pneumonia   . GERD (gastroesophageal reflux disease)   . Sjogren's disease 01/28/2015  . Pulmonary fibrosis   . 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 Received plasma exchange and steroids but didn't respond and ended up starting hemodialysis June 2016.   Marland Kitchen Acute on chronic diastolic CHF (congestive heart failure) 04/18/2015  . Accelerated  hypertension 04/19/2015   Social History   Social History  . Marital Status: Married    Spouse Name: N/A  . Number of Children: N/A  . Years of Education: N/A   Social History Main Topics  . Smoking status: Never Smoker   . Smokeless tobacco: Never Used  . Alcohol Use: No  . Drug Use: No  . Sexual Activity: Not Asked   Other Topics  Concern  . None   Social History Narrative   Family History  Problem Relation Age of Onset  . Hypertension Mother    Scheduled Meds: . albumin human      . ammonium lactate   Topical TID  . amoxicillin-clavulanate  1 tablet Oral Q2000  . antiseptic oral rinse  7 mL Mouth Rinse BID  . aspirin EC  81 mg Oral Daily  . atorvastatin  40 mg Oral q1800  . dextrose  1 ampule Intravenous Once  . feeding supplement (NEPRO CARB STEADY)  237 mL Oral BID BM  . feeding supplement (PRO-STAT SUGAR FREE 64)  30 mL Oral BID  . guaiFENesin  600 mg Oral BID  . heparin subcutaneous  5,000 Units Subcutaneous 3 times per day  . insulin aspart  10 Units Intravenous Once  . multivitamin  1 tablet Oral QHS  . pantoprazole  40 mg Oral Daily  . sodium chloride  3 mL Intravenous Q12H   Continuous Infusions:  PRN Meds:.sodium chloride, sodium chloride, acetaminophen **OR** acetaminophen, alteplase, antiseptic oral rinse, artificial tears, diphenhydrAMINE, feeding supplement (NEPRO CARB STEADY), heparin, hydrALAZINE, lanolin, lidocaine (PF), lidocaine-prilocaine, ondansetron **OR** ondansetron (ZOFRAN) IV, pentafluoroprop-tetrafluoroeth Medications Prior to Admission:  Prior to Admission medications   Medication Sig Start Date End Date Taking? Authorizing Provider  cyanocobalamin 500 MCG tablet Take 1 tablet (500 mcg total) by mouth daily. 12/26/14  Yes Donne Hazel, MD  hydrOXYzine (ATARAX/VISTARIL) 10 MG tablet Take 1 tablet (10 mg total) by mouth 3 (three) times daily as needed. 02/19/15  Yes Kelvin Cellar, MD  multivitamin (RENA-VIT) TABS tablet Take 1 tablet by mouth daily.   Yes Historical Provider, MD  pantoprazole (PROTONIX) 40 MG tablet Take 1 tablet (40 mg total) by mouth daily. 01/31/15  Yes Barton Dubois, MD  triamcinolone cream (KENALOG) 0.1 % Apply 1 application topically 2 (two) times daily.   Yes Historical Provider, MD  white petrolatum (VASELINE) GEL Apply 1 application topically as needed for  dry skin.   Yes Historical Provider, MD  amoxicillin-clavulanate (AUGMENTIN) 500-125 MG per tablet Take 1 tablet (500 mg total) by mouth daily at 8 pm. 04/23/15   Reyne Dumas, MD  aspirin EC 81 MG EC tablet Take 1 tablet (81 mg total) by mouth daily. 04/23/15   Reyne Dumas, MD  atorvastatin (LIPITOR) 40 MG tablet Take 1 tablet (40 mg total) by mouth daily at 6 PM. 04/23/15   Reyne Dumas, MD  carvedilol (COREG) 6.25 MG tablet Take 1 tablet (6.25 mg total) by mouth 2 (two) times daily with a meal. 04/23/15   Reyne Dumas, MD  ferrous sulfate 325 (65 FE) MG tablet Take 1 tablet (325 mg total) by mouth 2 (two) times daily with a meal. 12/26/14   Donne Hazel, MD  furosemide (LASIX) 40 MG tablet Take 1 tablet (40 mg total) by mouth daily. 01/28/15   Brand Males, MD  guaiFENesin (MUCINEX) 600 MG 12 hr tablet Take 1 tablet (600 mg total) by mouth 2 (two) times daily. 04/23/15   Reyne Dumas, MD  hydrALAZINE (  APRESOLINE) 25 MG tablet Take 1 tablet (25 mg total) by mouth every 6 (six) hours. 04/23/15   Reyne Dumas, MD  isosorbide mononitrate (IMDUR) 30 MG 24 hr tablet Take 1 tablet (30 mg total) by mouth daily. 04/23/15   Reyne Dumas, MD  Nutritional Supplements (FEEDING SUPPLEMENT, NEPRO CARB STEADY,) LIQD Take 237 mLs by mouth 2 (two) times daily between meals. 04/23/15   Reyne Dumas, MD  traMADol-acetaminophen (ULTRACET) 37.5-325 MG per tablet Take 1-2 tablets by mouth every 4 (four) hours as needed for severe pain. 04/23/15   Reyne Dumas, MD   No Known Allergies CBC:    Component Value Date/Time   WBC 14.1* 04/21/2015 0258   HGB 12.1* 04/21/2015 0258   HCT 38.2* 04/21/2015 0258   PLT 162 04/21/2015 0258   MCV 90.3 04/21/2015 0258   NEUTROABS 13.1* 04/18/2015 1625   LYMPHSABS 0.4* 04/18/2015 1625   MONOABS 1.6* 04/18/2015 1625   EOSABS 0.0 04/18/2015 1625   BASOSABS 0.1 04/18/2015 1625   Comprehensive Metabolic Panel:    Component Value Date/Time   NA 133* 04/23/2015 0230   K 4.3  04/23/2015 0230   CL 97* 04/23/2015 0230   CO2 24 04/23/2015 0230   BUN 36* 04/23/2015 0230   CREATININE 5.64* 04/23/2015 0230   GLUCOSE 88 04/23/2015 0230   CALCIUM 8.4* 04/23/2015 0230   AST 41 04/23/2015 0230   ALT 29 04/23/2015 0230   ALKPHOS 108 04/23/2015 0230   BILITOT 0.7 04/23/2015 0230   PROT 5.9* 04/23/2015 0230   ALBUMIN 1.9* 04/23/2015 0230    Physical Exam: Vital Signs: BP 137/74 mmHg  Pulse 94  Temp(Src) 97.4 F (36.3 C) (Axillary)  Resp 18  Ht 5' 7"  (1.702 m)  Wt 74.3 kg (163 lb 12.8 oz)  BMI 25.65 kg/m2  SpO2 100% SpO2: SpO2: 100 % O2 Device: O2 Device: Nasal Cannula O2 Flow Rate: O2 Flow Rate (L/min): 2 L/min Intake/output summary:  Intake/Output Summary (Last 24 hours) at 04/23/15 1428 Last data filed at 04/23/15 1226  Gross per 24 hour  Intake    590 ml  Output  -1099 ml  Net   1689 ml   LBM: Last BM Date: 04/22/15 Baseline Weight: Weight: 90.8 kg (200 lb 2.8 oz) Most recent weight: Weight: 74.3 kg (163 lb 12.8 oz)  Exam Findings:  Chronically ill-appearing 75 year old gentleman, he is talkative he is alert and oriented to person and place and mostly time he has some confusion surrounding the details of his medical condition he is aware that he is having confusion and I believe this is situational. He has extremely dry skin with cracking and leathery-like thickening especially on his hands. He is bilateral hands are swollen he has joint inflammation, he appears to be uncomfortable due to itching. He has severe pain and allodynia in his bilateral lower extremities, 2+ pitting edema, dry oral mucosa and injected conjunctiva. Poor appetite.          Palliative Performance Scale: 40            Additional Data Reviewed: Recent Labs     04/21/15  0258  04/21/15  0900  04/23/15  0230  WBC  14.1*   --    --   HGB  12.1*   --    --   PLT  162   --    --   NA   --   136  133*  BUN   --   34*  36*  CREATININE   --  5.49*  5.64*     Time In:  1:30 Time Out: 250 Time Total: 80 minutes Greater than 50%  of this time was spent counseling and coordinating care related to the above assessment and plan.  Signed by: Roma Schanz, DO  04/23/2015, 2:28 PM  Please contact Palliative Medicine Team phone at [phone] for questions and concerns.

## 2015-04-23 NOTE — Discharge Instructions (Addendum)

## 2015-04-23 NOTE — Progress Notes (Signed)
Chaplain responded to a consult. Pt is receiving bad news about a diease that is fast progressing.  Chaplain met with Dr. Golding and received information before entering room. Youngest son (Travis [name]) was at his bedside. Chaplain notice the difficulty the Pt had receiving diagnostics. Chaplain offered an empathic ear at any time they want to talk. Chaplain prayed with Pt and son. Chaplain let them know we are available at any time.    04/23/15 1500  Clinical Encounter Type  Visited With Patient and family together  Visit Type Spiritual support  Referral From Palliative care team  Spiritual Encounters  Spiritual Needs Emotional;Prayer  Stress Factors  Patient Stress Factors Major life changes  Family Stress Factors Major life changes      

## 2015-04-23 NOTE — Progress Notes (Signed)
[facility]  Patient Status: Active (receiving services up to time of hospitalization)  [facility] is providing the following services: RN, PT, OT and MSW  If patient discharges after hours, please call [phone].   Travis [name] 04/23/2015, 10:27 AM

## 2015-04-23 NOTE — Progress Notes (Signed)
PT Cancellation Note  Patient Details Name: Travis [name] MRN: [id number] DOB: 02/04/1941   Cancelled Treatment:    Reason Eval/Treat Not Completed: Patient at procedure or test/unavailable Pt taken to dialysis. RN to notify PT when patient returns for evaluation.  Ellouise Newer 04/23/2015, 9:01 AM  Elayne Snare, [place]

## 2015-04-23 NOTE — Progress Notes (Signed)
OT Cancellation Note  Patient Details Name: Travis [name] MRN: [id number] DOB: February 26, 1941   Cancelled Treatment:    Reason Eval/Treat Not Completed: Patient at procedure or test/ unavailable (HD). Will continue to follow.  [name] 04/23/2015, 9:42 AM

## 2015-04-23 NOTE — Progress Notes (Signed)
[facility] PROGRESS NOTE  Travis [name] [id number] DOB: Jan 19, 1941 DOA: 04/18/2015 PCP: [name], MD  Assessment/Plan: Principal Problem:   NSTEMI (non-ST elevated myocardial infarction) Active Problems:   Pulmonary fibrosis   HCAP (healthcare-associated pneumonia)   Sjogren's disease   DVT (deep venous thrombosis)   ESRD on dialysis   Acute on chronic diastolic CHF (congestive heart failure)   Accelerated hypertension   Protein-calorie malnutrition, severe     1. NSTEMI (non-ST elevated myocardial infarction), 2-D echo shows EF of 35-40%, no regional wall motion abnormalities, Patient is presented with chest pain as well as progressively worsening shortness of breath likely secondary to acute on chronic heart failure. Cardiology consulted for elevated troponin, at this time they recommend medical management, iv heparin drip discontinued As per cardiology With comorbidities he is not a candidate for cardiac cath as per cardiology. Cardiology recommends  medical therapy. They started him on hydralazine but patient not tolerating these and became hypotensive during dialysis today. Venous Doppler negative, low suspicion for PE. Palliative care consultation ordered for goals of care, wife is agreeable.   2. Acute on chronic diastolic heart failure likely secondary to volume overload in the setting of ESRD 2-2 1/2 L via hemodialysis, Next HD 8/10. Hold  Imdur, Coreg and hydralazine due to hypotension during dialysis   3. Accelerated hypertension. Hold Coreg, by mouth hydralazine, imdur,  4. Possible healthcare related pneumonia with pulmonary fibrosis. Discussed with the critical care, diffuse pulmonary fibrosis component is very mild. At present the patient appears to be having cough with expectoration as well as leukocytosis. Discontinued vancomycin and Zosyn  for healthcare associated pneumonia and switched to Augmentin for possible aspiration, SLP consult to  rule out aspiration   5. Anxiety. Patient is also confused, wife is by the bedside and she states that at baseline the patient is not confused. Likely metabolic encephalopathy. Order CT head without contrast to rule out CVA   .  6. 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 Nephrology following. Patient has his first rheumatology appointment at [facility] on 8/18.  7. H/o DVT s/p IVC filter 02/2015 - not on anticoagulation as outpt, venous Doppler negative  8 diarrhea-. C. difficile negative, DC enteric precautions   Code Status:      Code Status Orders        Start     Ordered   04/18/15 2302  Full code   Continuous     04/18/15 2307     Family Communication: family updated about patient's clinical progress Disposition Plan:   [place]       [place] Spouse [phone]  [phone]  Palliative care consultation pending      Brief narrative: 74 y.o. 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 Other problems include history of right DVT with prior IVC filter, Sjogrens, hypertension, past admissions for PNA and notes indicate pulmonary fibrosis as well. He is on dialysis MWF at [facility]. He went to dialysis yesterday for his regular treatment, which was aborted after 2 hours and 40 minutes due to progressive dyspnea and tachycardia (and did not therefore achieve his EDW of 75 - got to 76.2) .   He was seen in the ED for progressive dyspnea, was hypoxemia with O2sats of 89%, was tachycardic (115-120) and hypertensive (160's/100's). Was  also complaining of a rash of about 10 days duration. Chest x ray showed vascular congestion. Troponin was elevated at 0.68. Had new anterolateral changes on ECG with question of new ischemia. Was felt to  have acute diastolic CHF.   Lactic acid elevated at 7.2, WBC elevated at 15,000 and ATB's have been started.    Consultants:  Nephrology  Cardiology  Procedures:  None  Antibiotics: Anti-infectives    Start     Dose/Rate Route Frequency Ordered Stop   04/23/15 2000  amoxicillin-clavulanate (AUGMENTIN) 500-125 MG per tablet 500 mg     1 tablet Oral Daily 04/22/15 1636     04/23/15 0000  amoxicillin-clavulanate (AUGMENTIN) 500-125 MG per tablet     1 tablet Oral Daily 04/23/15 0831     04/22/15 1115  amoxicillin-clavulanate (AUGMENTIN) 500-125 MG per tablet 500 mg  Status:  Discontinued     1 tablet Oral 2 times daily 04/22/15 1100 04/22/15 1636   04/21/15 1200  vancomycin (VANCOCIN) IVPB 1000 mg/200 mL premix  Status:  Discontinued     1,000 mg 200 mL/hr over 60 Minutes Intravenous Every M-W-F (Hemodialysis) 04/18/15 2318 04/19/15 0833   04/21/15 1200  vancomycin (VANCOCIN) IVPB 750 mg/150 ml premix  Status:  Discontinued     750 mg 150 mL/hr over 60 Minutes Intravenous Every M-W-F (Hemodialysis) 04/19/15 0833 04/22/15 1100   04/19/15 1200  vancomycin (VANCOCIN) 750 mg in sodium chloride 0.9 % 150 mL IVPB     750 mg 150 mL/hr over 60 Minutes Intravenous  Once 04/19/15 1158 04/19/15 1300   04/18/15 2330  piperacillin-tazobactam (ZOSYN) IVPB 2.25 g  Status:  Discontinued     2.25 g 100 mL/hr over 30 Minutes Intravenous 3 times per day 04/18/15 2318 04/22/15 1100   04/18/15 2330  vancomycin (VANCOCIN) 2,000 mg in sodium chloride 0.9 % 500 mL IVPB     2,000 mg 250 mL/hr over 120 Minutes Intravenous  Once 04/18/15 2318 04/19/15 0254         HPI/Subjective: Patient hypotensive during dialysis , discussed with nephrology , patient is not stable for discharge   Objective: Filed Vitals:   04/23/15 0930 04/23/15 0947 04/23/15 1000 04/23/15 1015  BP: 72/33 87/45 103/55 121/64  Pulse: 89 88 90 92  Temp:      TempSrc:      Resp:      Height:      Weight:      SpO2:         Intake/Output Summary (Last 24 hours) at 04/23/15 1020 Last data filed at 04/22/15 2200  Gross per 24 hour  Intake    590 ml  Output      2 ml  Net    588 ml    Exam:  General: No acute respiratory distress Lungs: Clear to auscultation bilaterally without wheezes or crackles Cardiovascular: Regular rate and rhythm without murmur gallop or rub normal S1 and S2 Abdomen: Nontender, nondistended, soft, bowel sounds positive, no rebound, no ascites, no appreciable mass Extremities: No significant cyanosis, clubbing, or edema bilateral lower extremities     Data Review   Micro Results Recent Results (from the past 240 hour(s))  MRSA PCR Screening     Status: None   Collection Time: 04/18/15 10:19 PM  Result Value Ref Range Status   MRSA by PCR NEGATIVE NEGATIVE Final    Comment:        The GeneXpert MRSA Assay (FDA approved for NASAL specimens only), is one component of  a comprehensive MRSA colonization surveillance program. It is not intended to diagnose MRSA infection nor to guide or monitor treatment for MRSA infections.   Culture, blood (x 2)     Status: None (Preliminary result)   Collection Time: 04/19/15  1:12 AM  Result Value Ref Range Status   Specimen Description BLOOD LEFT HAND  Final   Special Requests IN PEDIATRIC BOTTLE 2CC  Final   Culture NO GROWTH 3 DAYS  Final   Report Status PENDING  Incomplete  Culture, blood (routine x 2)     Status: None (Preliminary result)   Collection Time: 04/19/15  8:50 AM  Result Value Ref Range Status   Specimen Description BLOOD HEMODIALYSIS CATHETER  Final   Special Requests BOTTLES DRAWN AEROBIC AND ANAEROBIC 10CC  Final   Culture NO GROWTH 3 DAYS  Final   Report Status PENDING  Incomplete  Culture, blood (routine x 2)     Status: None (Preliminary result)   Collection Time: 04/19/15  9:05 AM  Result Value Ref Range Status   Specimen Description BLOOD HEMODIALYSIS CATHETER  Final   Special Requests BOTTLES DRAWN  AEROBIC AND ANAEROBIC 10CC  Final   Culture NO GROWTH 3 DAYS  Final   Report Status PENDING  Incomplete  C difficile quick scan w PCR reflex     Status: None   Collection Time: 04/20/15  8:40 AM  Result Value Ref Range Status   C Diff antigen NEGATIVE NEGATIVE Final   C Diff toxin NEGATIVE NEGATIVE Final   C Diff interpretation Negative for toxigenic C. difficile  Final    Radiology Reports Ct Head Wo Contrast  04/22/2015   CLINICAL DATA:  74 year old male with confusion and lethargy.  EXAM: CT HEAD WITHOUT CONTRAST  TECHNIQUE: Contiguous axial images were obtained from the base of the skull through the vertex without intravenous contrast.  COMPARISON:  None.  FINDINGS: Mild -moderate periventricular white matter hypodensities likely represent chronic small-vessel white matter ischemic changes.  No acute intracranial abnormalities are identified, including mass lesion or mass effect, hydrocephalus, extra-axial fluid collection, midline shift, hemorrhage, or acute infarction.  The visualized bony calvarium is unremarkable.  IMPRESSION: No evidence of acute intracranial abnormality.  Mild-moderate periventricular white matter hypodensities - likely chronic small-vessel white matter ischemic changes.   Electronically Signed   By: Margarette Canada M.D.   On: 04/22/2015 15:34   Dg Chest Port 1 View  04/22/2015   CLINICAL DATA:  Cough, chronic renal insufficiency  EXAM: PORTABLE CHEST - 1 VIEW  COMPARISON:  Chest x-ray of April 18, 2015  FINDINGS: The lungs are well-expanded. The interstitial markings are markedly improved. There is small right pleural effusion. The heart is mildly enlarged. The pulmonary vascularity is less engorged. The mediastinum is normal in width. The bony thorax exhibits no acute abnormality.  IMPRESSION: Continued improvement in pulmonary edema. There is a persistent small right pleural effusion.   Electronically Signed   By: David  Martinique M.D.   On: 04/22/2015 09:01   Dg Chest Port 1  View  04/18/2015   CLINICAL DATA:  74 year old male with shortness of breath, tachycardia. Renal failure on dialysis for 3 weeks. Initial encounter.  EXAM: PORTABLE CHEST - 1 VIEW  COMPARISON:  02/17/2015 and earlier.  FINDINGS: Portable AP semi upright view at 1605 hr. Tunneled right chest dialysis catheter is no longer present. Interval decreased but not resolved pleural effusions, residual more apparent on the right. Continued pulmonary vascular congestion, but also improved. Stable cardiac  size and mediastinal contours. No pneumothorax. No consolidation.  IMPRESSION: Improved but not resolved pulmonary edema and small pleural effusions since June.  Tunneled right chest dialysis catheter has been removed.   Electronically Signed   By: Genevie Ann M.D.   On: 04/18/2015 16:15     CBC  Recent Labs Lab 04/18/15 1625 04/19/15 1003 04/20/15 1100 04/21/15 0258  WBC 15.1* 13.0* 14.6* 14.1*  HGB 14.0 12.6* 13.8 12.1*  HCT 44.4 39.0 44.1 38.2*  PLT 119* 138* 147* 162  MCV 91.4 88.6 92.3 90.3  MCH 28.8 28.6 28.9 28.6  MCHC 31.5 32.3 31.3 31.7  RDW 20.4* 20.3* 21.3* 21.3*  LYMPHSABS 0.4*  --   --   --   MONOABS 1.6*  --   --   --   EOSABS 0.0  --   --   --   BASOSABS 0.1  --   --   --     Chemistries   Recent Labs Lab 04/19/15 0030 04/19/15 1005 04/20/15 1100 04/21/15 0900 04/23/15 0230  NA 135 135 138 136 133*  K >7.5* 4.8 4.8 3.7 4.3  CL 96* 97* 94* 96* 97*  CO2 17* 22 28 26 24   GLUCOSE 102* 105* 91 110* 88  BUN 18 22* 23* 34* 36*  CREATININE 4.98* 5.29* 4.43* 5.49* 5.64*  CALCIUM 8.4* 8.3* 8.6* 8.2* 8.4*  AST 107*  --  50*  --  41  ALT 31  --  28  --  29  ALKPHOS 111  --  115  --  108  BILITOT 1.7*  --  0.6  --  0.7   ------------------------------------------------------------------------------------------------------------------ estimated creatinine clearance is 10.9 mL/min (by C-G formula based on Cr of  5.64). ------------------------------------------------------------------------------------------------------------------ No results for input(s): HGBA1C in the last 72 hours. ------------------------------------------------------------------------------------------------------------------ No results for input(s): CHOL, HDL, LDLCALC, TRIG, CHOLHDL, LDLDIRECT in the last 72 hours. ------------------------------------------------------------------------------------------------------------------ No results for input(s): TSH, T4TOTAL, T3FREE, THYROIDAB in the last 72 hours.  Invalid input(s): FREET3 ------------------------------------------------------------------------------------------------------------------ No results for input(s): VITAMINB12, FOLATE, FERRITIN, TIBC, IRON, RETICCTPCT in the last 72 hours.  Coagulation profile  Recent Labs Lab 04/19/15 0030  INR 1.15    No results for input(s): DDIMER in the last 72 hours.  Cardiac Enzymes  Recent Labs Lab 04/18/15 2204 04/21/15 1535  TROPONINI 0.56* 0.54*   ------------------------------------------------------------------------------------------------------------------ Invalid input(s): POCBNP   CBG:  Recent Labs Lab 04/18/15 2048 04/19/15 1427 04/19/15 1550 04/23/15 0908  GLUCAP 98 49* 98 91       Studies: Ct Head Wo Contrast  04/22/2015   CLINICAL DATA:  74 year old male with confusion and lethargy.  EXAM: CT HEAD WITHOUT CONTRAST  TECHNIQUE: Contiguous axial images were obtained from the base of the skull through the vertex without intravenous contrast.  COMPARISON:  None.  FINDINGS: Mild -moderate periventricular white matter hypodensities likely represent chronic small-vessel white matter ischemic changes.  No acute intracranial abnormalities are identified, including mass lesion or mass effect, hydrocephalus, extra-axial fluid collection, midline shift, hemorrhage, or acute infarction.  The visualized bony  calvarium is unremarkable.  IMPRESSION: No evidence of acute intracranial abnormality.  Mild-moderate periventricular white matter hypodensities - likely chronic small-vessel white matter ischemic changes.   Electronically Signed   By: Margarette Canada M.D.   On: 04/22/2015 15:34   Dg Chest Port 1 View  04/22/2015   CLINICAL DATA:  Cough, chronic renal insufficiency  EXAM: PORTABLE CHEST - 1 VIEW  COMPARISON:  Chest x-ray of April 18, 2015  FINDINGS: The  lungs are well-expanded. The interstitial markings are markedly improved. There is small right pleural effusion. The heart is mildly enlarged. The pulmonary vascularity is less engorged. The mediastinum is normal in width. The bony thorax exhibits no acute abnormality.  IMPRESSION: Continued improvement in pulmonary edema. There is a persistent small right pleural effusion.   Electronically Signed   By: David  Martinique M.D.   On: 04/22/2015 09:01      Lab Results  Component Value Date   HGBA1C 4.9 01/30/2015   HGBA1C 4.6* 01/29/2015   Lab Results  Component Value Date   CREATININE 5.64* 04/23/2015       Scheduled Meds: . albumin human      . amoxicillin-clavulanate  1 tablet Oral Q2000  . antiseptic oral rinse  7 mL Mouth Rinse BID  . aspirin EC  81 mg Oral Daily  . atorvastatin  40 mg Oral q1800  . dextrose  1 ampule Intravenous Once  . feeding supplement (NEPRO CARB STEADY)  237 mL Oral BID BM  . feeding supplement (PRO-STAT SUGAR FREE 64)  30 mL Oral BID  . guaiFENesin  600 mg Oral BID  . heparin subcutaneous  5,000 Units Subcutaneous 3 times per day  . insulin aspart  10 Units Intravenous Once  . isosorbide mononitrate  30 mg Oral Daily  . multivitamin  1 tablet Oral QHS  . pantoprazole  40 mg Oral Daily  . sodium chloride  3 mL Intravenous Q12H   Continuous Infusions:    Principal Problem:   NSTEMI (non-ST elevated myocardial infarction) Active Problems:   Pulmonary fibrosis   HCAP (healthcare-associated pneumonia)    Sjogren's disease   DVT (deep venous thrombosis)   ESRD on dialysis   Acute on chronic diastolic CHF (congestive heart failure)   Accelerated hypertension   Protein-calorie malnutrition, severe    Time spent: 45 minutes   [facility] Pager [phone]. If 7PM-7AM, please contact night-coverage at [email or web address], password [facility] 04/23/2015, 10:20 AM  LOS: 5 days

## 2015-04-23 NOTE — Progress Notes (Signed)
Patient transferred from two heart. Alert and able to make needs known. BP elevated. Bath and ready for dialysis.

## 2015-04-23 NOTE — Evaluation (Signed)
Physical Therapy Evaluation Patient Details Name: Travis [name] MRN: [id number] DOB: 1941/01/14 Today's Date: 04/23/2015   History of Present Illness  74 y.o. male Patient is presented with chest pain as well as progressively worsening shortness of breath likely secondary to acute on chronic heart failure. Admitted for NSTEMI, Acute on chronic diastolic heart failure likely secondary to volume overload in the setting of ESRD, accelerated hypertension, and Possible healthcare related pneumonia with pulmonary fibrosis.  Clinical Impression  Pt admitted with the above complications. Pt currently with functional limitations due to the deficits listed below (see PT Problem List). Required Min-Mod assist for bed mobility and transfer to chair from bed. Effortful for patient, with HR to 120 (resting in 90s.) Patient is weak and will require physical assist for mobility at home. Family is very supportive and can provide 24/7 physical assist as needed. Pt and family very adamant about pt returning home. He would benefit from HHPT to maintain his currrent functional independence and hopefully progress his mobility safely. Will follow acutely providing pt and family education while focusing on bed mobility, transfers, strength, and gait as tolerated.       Follow Up Recommendations Home health PT;Supervision/Assistance - 24 hour    Equipment Recommendations  Wheelchair (measurements PT);Wheelchair cushion (measurements PT);Hospital bed    Recommendations for Other Services OT consult     Precautions / Restrictions Precautions Precautions: Fall Precaution Comments: monitor HR Restrictions Weight Bearing Restrictions: No      Mobility  Bed Mobility Overal bed mobility: Needs Assistance Bed Mobility: Supine to Sit     Supine to sit: Mod assist;HOB elevated     General bed mobility comments: HOB minimally elevated. Required mod assist for transition to edge of bed with pt encouraged to pull  through PTs hand and bed pad used to scoot to edge.  Transfers Overall transfer level: Needs assistance Equipment used: Rolling walker (2 wheeled) Transfers: Sit to/from [facility] Sit to Stand: Mod assist Stand pivot transfers: Min assist       General transfer comment: VC for hand placement and anterior weight shift with mod assist for boost to stand, leaning towards posterior initially. Tolerated standing x3 minutes. Min assist for walker control during pivot to chair with cues for technique. Cues for upright posture as he leans heavily over RW. Poor control with descent. HR elevated to 120  Ambulation/Gait                Stairs            Wheelchair Mobility    Modified Rankin (Stroke Patients Only)       Balance Overall balance assessment: Needs assistance Sitting-balance support: No upper extremity supported;Feet supported Sitting balance-Leahy Scale: Fair     Standing balance support: Bilateral upper extremity supported Standing balance-Leahy Scale: Poor                               Pertinent Vitals/Pain Pain Assessment: 0-10 Pain Score: 0-No pain Pain Intervention(s): Monitored during session    Home Living Family/patient expects to be discharged to:: Private residence Living Arrangements: Spouse/significant other Available Help at Discharge: Family;Available 24 hours/day Type of Home: House Home Access: Stairs to enter Entrance Stairs-Rails: Right;Left;Can reach both Entrance Stairs-Number of Steps: 5 Home Layout: Two level;Laundry or work area in basement;Able to live on main level with bedroom/bathroom Home Equipment: Environmental consultant - 2 wheels;Toilet riser      Prior Function  Level of Independence: Needs assistance   Gait / Transfers Assistance Needed: using RW modified independent; assist on steps  ADL's / Homemaking Assistance Needed: wife helps pt as needed. Some days he can bathe and dress some days he needs  assist.  Comments: Pt retired end of March '16.  Was a phys ed Pharmacist, hospital and privately taught line dancing, kickboxing, and karate.       Hand Dominance   Dominant Hand: Left    Extremity/Trunk Assessment   Upper Extremity Assessment: Defer to OT evaluation           Lower Extremity Assessment: Generalized weakness         Communication   Communication: No difficulties  Cognition Arousal/Alertness: Awake/alert Behavior During Therapy: WFL for tasks assessed/performed Overall Cognitive Status: Impaired/Different from baseline Area of Impairment: Orientation Orientation Level: Time;Situation                  General Comments General comments (skin integrity, edema, etc.): Family present in room, extremely supportive. Adamant to take care of father/husband at home. They can provide 24/7 physical assistance as needed. We discussed disposition options and needs if family decides to take care of pt at home. Orthostatics taken, see vitals tab.    Exercises        Assessment/Plan    PT Assessment Patient needs continued PT services  PT Diagnosis Difficulty walking;Generalized weakness;Altered mental status   PT Problem List Decreased strength;Decreased activity tolerance;Decreased balance;Decreased mobility;Decreased cognition;Decreased knowledge of use of DME;Cardiopulmonary status limiting activity;Pain  PT Treatment Interventions DME instruction;Gait training;Functional mobility training;Therapeutic activities;Stair training;Therapeutic exercise;Balance training;Neuromuscular re-education;Cognitive remediation;Patient/family education;Wheelchair mobility training   PT Goals (Current goals can be found in the Care Plan section) Acute Rehab PT Goals Patient Stated Goal: Go home tonight and come back tomorrow PT Goal Formulation: With patient/family Time For Goal Achievement: 05/07/15 Potential to Achieve Goals: Fair Additional Goals Additional Goal #1: Family will  demonstrate understanding of safely transferring wheelchair up/down steps    Frequency Min 3X/week   Barriers to discharge Inaccessible home environment stairs to enter home.    Co-evaluation               End of Session Equipment Utilized During Treatment: Gait belt;Oxygen Activity Tolerance: Patient limited by fatigue Patient left: in chair;with call bell/phone within reach;with family/visitor present Nurse Communication: Mobility status         Time: [id number] PT Time Calculation (min) (ACUTE ONLY): 35 min   Charges:   PT Evaluation $Initial PT Evaluation Tier I: 1 Procedure PT Treatments $Therapeutic Activity: 8-22 mins   PT G CodesEllouise Newer 04/23/2015, 6:04 PM Camille Bal Onward, [place]

## 2015-04-23 NOTE — Progress Notes (Addendum)
[facility] Progress Note   Subjective: BP dropping into 60's-70's early into dialysis , passed out briefly on HD  Filed Vitals:   04/23/15 0858 04/23/15 0900 04/23/15 0930 04/23/15 0947  BP: 62/29 120/56 72/33 87/45   Pulse: 96 81 89 88  Temp:      TempSrc:      Resp:      Height:      Weight:      SpO2:       Exam: No distress, tired appearing Skin diffuse darkening  No jvd Chest clear bilat RRR no MRG Abd soft ntnd no ascites No LE edema R arm AVG +bruit Neuro is oriented to place and year, name  ECHO 8/7 LVEF 35-40%, normal RV  MWF [facility]  4h  3/2.0 bath 75kg  Heparin none  R arm AVG Aranesp 150/wk + venofer 50/wk       Assessment: 1. SOB/ pulm edema/ vol excess - resolved 2. Hypotension - will dc hydralazine/ coreg for now 3. 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 ANA/ANCA/ UPEP were neg. Was dc'd on pred 20/d to flu with Rheum in clinic 5. Cardiomyopathy - LV eject fraction low at 35-40%, RV ok by echo.    6. Cleora Fleet / EKG changes - not candidate for cath due to comorbidities, cardiology following. Heparin stopped 7. ^WBC - no fevers, work up neg so far.  On empiric abx.  8. 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 Diarrhea - Cdif neg, still loose stools 12. 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 HTN - on coreg/ hydralazine, BP's good  Plan - HD today. No fluid off, not tolerating UF at all today. DC'd BP meds for now.     Kelly Splinter MD  pager [phone]    cell [phone]  04/23/2015, 10:03 AM     Recent Labs Lab 04/20/15 1100 04/21/15 0900 04/23/15 0230  NA 138 136 133*  K 4.8 3.7 4.3  CL 94* 96* 97*  CO2 28 26 24   GLUCOSE 91  110* 88  BUN 23* 34* 36*  CREATININE 4.43* 5.49* 5.64*  CALCIUM 8.6* 8.2* 8.4*  PHOS  --  3.9  --     Recent Labs Lab 04/19/15 0030 04/20/15 1100 04/21/15 0900 04/23/15 0230  AST 107* 50*  --  41  ALT 31 28  --  29  ALKPHOS 111 115  --  108  BILITOT 1.7* 0.6  --  0.7  PROT 7.0 6.9  --  5.9*  ALBUMIN 2.4* 2.2* 1.9* 1.9*    Recent Labs Lab 04/18/15 1625 04/19/15 1003 04/20/15 1100 04/21/15 0258  WBC 15.1* 13.0* 14.6* 14.1*  NEUTROABS 13.1*  --   --   --   HGB 14.0 12.6* 13.8 12.1*  HCT 44.4 39.0 44.1 38.2*  MCV 91.4 88.6 92.3 90.3  PLT 119* 138* 147* 162   . albumin human      . amoxicillin-clavulanate  1 tablet Oral Q2000  . antiseptic oral rinse  7 mL Mouth Rinse BID  . aspirin EC  81 mg Oral Daily  . atorvastatin  40 mg Oral q1800  . carvedilol  6.25 mg Oral  BID WC  . dextrose  1 ampule Intravenous Once  . feeding supplement (NEPRO CARB STEADY)  237 mL Oral BID BM  . feeding supplement (PRO-STAT SUGAR FREE 64)  30 mL Oral BID  . guaiFENesin  600 mg Oral BID  . heparin subcutaneous  5,000 Units Subcutaneous 3 times per day  . hydrALAZINE  25 mg Oral 4 times per day  . insulin aspart  10 Units Intravenous Once  . isosorbide mononitrate  30 mg Oral Daily  . multivitamin  1 tablet Oral QHS  . pantoprazole  40 mg Oral Daily  . sodium chloride  3 mL Intravenous Q12H     sodium chloride, sodium chloride, acetaminophen **OR** acetaminophen, alteplase, feeding supplement (NEPRO CARB STEADY), heparin, hydrALAZINE, lidocaine (PF), lidocaine-prilocaine, ondansetron **OR** ondansetron (ZOFRAN) IV, pentafluoroprop-tetrafluoroeth

## 2015-04-24 LAB — CBC
HCT: 36 % — ABNORMAL LOW (ref 39.0–52.0)
Hemoglobin: 10.9 g/dL — ABNORMAL LOW (ref 13.0–17.0)
MCH: 28.6 pg (ref 26.0–34.0)
MCHC: 30.3 g/dL (ref 30.0–36.0)
MCV: 94.5 fL (ref 78.0–100.0)
Platelets: 122 10*3/uL — ABNORMAL LOW (ref 150–400)
RBC: 3.81 MIL/uL — ABNORMAL LOW (ref 4.22–5.81)
RDW: 21.5 % — AB (ref 11.5–15.5)
WBC: 12.7 10*3/uL — AB (ref 4.0–10.5)

## 2015-04-24 LAB — CULTURE, BLOOD (ROUTINE X 2)
CULTURE: NO GROWTH
CULTURE: NO GROWTH
Culture: NO GROWTH

## 2015-04-24 LAB — COMPREHENSIVE METABOLIC PANEL
ALT: 26 U/L (ref 17–63)
ANION GAP: 11 (ref 5–15)
AST: 41 U/L (ref 15–41)
Albumin: 2.1 g/dL — ABNORMAL LOW (ref 3.5–5.0)
Alkaline Phosphatase: 109 U/L (ref 38–126)
BUN: 20 mg/dL (ref 6–20)
CO2: 27 mmol/L (ref 22–32)
Calcium: 8.2 mg/dL — ABNORMAL LOW (ref 8.9–10.3)
Chloride: 96 mmol/L — ABNORMAL LOW (ref 101–111)
Creatinine, Ser: 4.26 mg/dL — ABNORMAL HIGH (ref 0.61–1.24)
GFR calc Af Amer: 15 mL/min — ABNORMAL LOW (ref >60)
GFR calc non Af Amer: 13 mL/min — ABNORMAL LOW (ref >60)
Glucose, Bld: 112 mg/dL — ABNORMAL HIGH (ref 65–99)
Potassium: 3.2 mmol/L — ABNORMAL LOW (ref 3.5–5.1)
SODIUM: 134 mmol/L — AB (ref 135–145)
TOTAL PROTEIN: 6.1 g/dL — AB (ref 6.5–8.1)
Total Bilirubin: 0.8 mg/dL (ref 0.3–1.2)

## 2015-04-24 MED ORDER — CARVEDILOL 3.125 MG PO TABS
3.1250 mg | ORAL_TABLET | Freq: Two times a day (BID) | ORAL | Status: DC
  Administered 2015-04-24 – 2015-04-26 (×4): 3.125 mg via ORAL
  Filled 2015-04-24 (×7): qty 1

## 2015-04-24 NOTE — Progress Notes (Signed)
[facility] Progress Note   Subjective: no new complaints  Filed Vitals:   04/23/15 1327 04/23/15 1953 04/24/15 0426 04/24/15 0733  BP: 137/74 139/71 150/77 143/68  Pulse: 94 102 100 99  Temp: 97.4 F (36.3 C) 98.3 F (36.8 C) 98 F (36.7 C) 98.5 F (36.9 C)  TempSrc: Axillary Oral Oral Oral  Resp: 18 18 18 18   Height:      Weight:   72.4 kg (159 lb 9.8 oz)   SpO2: 100% 100% 100% 100%   Exam: No distress, tired appearing Skin diffuse darkening and thickening No jvd Chest clear bilat RRR no MRG Abd soft ntnd no ascites 1+ bilat LE edema R arm AVG +bruit Neuro is oriented to place and year, name  ECHO 8/7 LVEF 35-40%, normal RV  MWF [facility]  4h  3/2.0 bath 75kg  Heparin none  R arm AVG Aranesp 150/wk + venofer 50/wk       Assessment: 1. ESRD on HD started June '16 (TMA by bx). HD tomorrow. 2. 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  In June he had renal failure with TMA by renal biopsy in June, had some +serologies for autoimmune disease (RA/SSA/SSB+) but didn't have skin changes.  TMA is the tissue manifestation of scleroderma renal crisis [facility]).  There are reports of [facility] causing TMA/renal failure before the skin manifestations appear. 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  3. Abnl EKG/ Cleora Fleet / EF 40-% - not candidate for cath due to comorbidities, heparin stopped 4. H/o DVT s/p IVC filter 02/2015 - not on anticoagulation as outpt 5. Anemia - holding esa for ^Hb 6. 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 Diarrhea - Cdif neg, still loose stools 8. 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 HTN - on coreg/ hydralazine, BP's good 10. Prognosis - seen by pall care team. Poor prognosis overall, appreciate pall care input  Plan - HD tomorrow    Kelly Splinter MD  pager  [phone]    cell [phone]  04/24/2015, 11:53 AM     Recent Labs Lab 04/21/15 0900 04/23/15 0230 04/24/15 0336  NA 136 133* 134*  K 3.7 4.3 3.2*  CL 96* 97* 96*  CO2 26 24 27   GLUCOSE 110* 88 112*  BUN 34* 36* 20  CREATININE 5.49* 5.64* 4.26*  CALCIUM 8.2* 8.4* 8.2*  PHOS 3.9  --   --     Recent Labs Lab 04/20/15 1100 04/21/15 0900 04/23/15 0230 04/24/15 0336  AST 50*  --  41 41  ALT 28  --  29 26  ALKPHOS 115  --  108 109  BILITOT 0.6  --  0.7 0.8  PROT 6.9  --  5.9* 6.1*  ALBUMIN 2.2* 1.9* 1.9* 2.1*    Recent Labs Lab 04/18/15 1625  04/20/15 1100 04/21/15 0258 04/24/15 0336  WBC 15.1*  < > 14.6* 14.1* 12.7*  NEUTROABS 13.1*  --   --   --   --   HGB 14.0  < > 13.8 12.1* 10.9*  HCT 44.4  < > 44.1 38.2* 36.0*  MCV 91.4  < > 92.3 90.3 94.5  PLT 119*  < > 147* 162 122*  < > = values in this interval not displayed. Marland Kitchen ammonium lactate   Topical TID  . amoxicillin-clavulanate  1 tablet Oral Q2000  .  antiseptic oral rinse  7 mL Mouth Rinse BID  . aspirin EC  81 mg Oral Daily  . atorvastatin  40 mg Oral q1800  . carvedilol  3.125 mg Oral BID WC  . dextrose  1 ampule Intravenous Once  . feeding supplement (NEPRO CARB STEADY)  237 mL Oral BID BM  . feeding supplement (PRO-STAT SUGAR FREE 64)  30 mL Oral BID  . guaiFENesin  600 mg Oral BID  . heparin subcutaneous  5,000 Units Subcutaneous 3 times per day  . insulin aspart  10 Units Intravenous Once  . multivitamin  1 tablet Oral QHS  . pantoprazole  40 mg Oral Daily  . sodium chloride  3 mL Intravenous Q12H     sodium chloride, sodium chloride, acetaminophen **OR** acetaminophen, alteplase, antiseptic oral rinse, artificial tears, diphenhydrAMINE, feeding supplement (NEPRO CARB STEADY), heparin, hydrALAZINE, lanolin, lidocaine (PF), lidocaine-prilocaine, ondansetron **OR** ondansetron (ZOFRAN) IV, pentafluoroprop-tetrafluoroeth

## 2015-04-24 NOTE — Care Management Note (Signed)
Case Management Note Marvetta Gibbons RN, BSN Unit 2W-Case Manager [phone]  Patient Details  Name: Travis [name] MRN: [id number] Date of Birth: 04-03-1941  Subjective/Objective:   Pt admitted with NSTEMI               Action/Plan: PTA Pt lived at home with spouse- recently started on HD- pt active with [facility] for [facility] -RN/PT/OT/SW- per conversation with pt's wife and son at bedside (pt was in HD) they would like to continue services with [facility]- however PT/OT evals are pending and they have questions regarding recommendations and if pt may need rehab- DME at home include RW, cane, 3n1- they are interested in a hospital bed but do not feel pt needs w/c. - also discussed that pt is a [place]- pt state that they have received bill from last admit and wonder if [facility] paid- instructed them that I would not know this info and they would need to call the # on back of bill to discuss this also advised them to contact the [place] regarding the bill from last visit. NCM to f/u once PT/OT sees pt after HD  Expected Discharge Date:                  Expected Discharge Plan:  [place]  In-House Referral:     Discharge planning Services  CM Consult  Post Acute Care Choice:  [place], Resumption of Svcs/PTA Provider, Durable Medical Equipment Choice offered to:  Patient, Spouse, Adult Children  DME Arranged:    DME Agency:  [facility] Arranged:  RN, PT, OT [facility] Agency:  [place]  Status of Service:  In process, will continue to follow  Medicare Important Message Given:  Yes-second notification given Date Medicare IM Given:    Medicare IM give by:    Date Additional Medicare IM Given:    Additional Medicare Important Message give by:     If discussed at [place] of Stay Meetings, dates discussed:    Additional Comments:  04/24/15- f/u done with pt, wife and son at bedside regarding d/c needs and plan- PT/OT evals completed and recommending HH- pt states that  he does not want to go to [facility]- he wants to return [facility] with [facility] and continue to go to HD as he has been doing from home- discussed again DME needs for home pt and family would like hospital bed- no other DME needs noted. Pt active with [facility]- and would like to continue [facility]- will need resumption orders for RN/PT/OT/SW-  Family again asked about VA benefits- will call [facility] and ask about pt's service connection-   Dahlia Client, Romeo Rabon, RN 04/24/2015, 10:13 AM

## 2015-04-24 NOTE — Progress Notes (Signed)
Chaplain followed up from a previous visit.Chaplain prayed with Pt and checked for other needs. Chaplain will continue to monitor.    04/24/15 1400  Clinical Encounter Type  Visited With Patient  Visit Type Spiritual support  Referral From Palliative care team  Spiritual Encounters  Spiritual Needs Prayer

## 2015-04-24 NOTE — Progress Notes (Signed)
[facility] PROGRESS NOTE  Travis [name] [id number] DOB: April 04, 1941 DOA: 04/18/2015 PCP: [name], MD  Assessment/Plan: Principal Problem:   NSTEMI (non-ST elevated myocardial infarction) Active Problems:   Pulmonary fibrosis   HCAP (healthcare-associated pneumonia)   Sjogren's disease   DVT (deep venous thrombosis)   ESRD on dialysis   Acute on chronic diastolic CHF (congestive heart failure)   Accelerated hypertension   Protein-calorie malnutrition, severe      NSTEMI (non-ST elevated myocardial infarction),  - Cardiology consulted for elevated troponin, at this time they recommend medical management, iv heparin drip discontinued -  not a candidate for cardiac cath as per cardiology giving his comorbidities. They recommend medical therapy , unfortunately hydralazine, Imdur, Coreg has been stopped giving his low blood pressure during dialysis. -  Venous Doppler negative, low suspicion for PE.  - Palliative care consultation ordered for goals of care, wife is agreeable. - 2-D echo shows EF of 35-40%, no regional wall motion abnormalities.   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 -  his first rheumatology appointment at [facility] on 8/1   Accelerated hypertension. - Given low blood pressure during dialysis on medication has been stopped   Possible healthcare related pneumonia with pulmonary fibrosis. - Dr. Allyson Sabal discussed with the critical care, diffuse pulmonary fibrosis component is very mild. - off  vancomycin and Zosyn  for healthcare associated pneumonia , currently on Augmentin. -  SLP consult to rule out aspiration, mild risk per SLP  H/o  DVT -  s/p IVC filter 02/2015 - not on anticoagulation as outpt, venous Doppler negative   Diarrhea -  C. difficile negative .   Code Status:      Code Status Orders        Start     Ordered   04/18/15 2302  Full code   Continuous     04/18/15 2307     Family Communication: family updated about patient's clinical progress Disposition Plan:   [place]       [place] Spouse [phone]  [phone]  Palliative care consultation pending      Brief narrative: 74 y.o. 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 Other problems include history of right DVT with prior IVC filter, Sjogrens, hypertension, past admissions for PNA and notes indicate pulmonary fibrosis as well. He is on dialysis MWF at [facility]. He went to dialysis yesterday for his regular treatment, which was aborted after 2 hours and 40 minutes due to progressive dyspnea and tachycardia (and did not therefore achieve his EDW of 75 - got to 76.2) .   He was seen in the ED for progressive dyspnea, was hypoxemia with O2sats of 89%, was tachycardic (115-120) and hypertensive (160's/100's). Was also complaining of a rash of about 10 days duration. Chest x ray showed vascular congestion. Troponin was elevated at 0.68. Had new anterolateral changes on ECG with question of new ischemia. Was felt to have acute diastolic CHF.      Consultants:  Nephrology  Cardiology  Procedures:  None  Antibiotics: Anti-infectives    Start  Dose/Rate Route Frequency Ordered Stop   04/23/15 2000  amoxicillin-clavulanate (AUGMENTIN) 500-125 MG per tablet 500 mg     1 tablet Oral Daily 04/22/15 1636     04/23/15 0000  amoxicillin-clavulanate (AUGMENTIN) 500-125 MG per tablet     1 tablet Oral Daily 04/23/15 0831     04/22/15 1115  amoxicillin-clavulanate (AUGMENTIN)  500-125 MG per tablet 500 mg  Status:  Discontinued     1 tablet Oral 2 times daily 04/22/15 1100 04/22/15 1636   04/21/15 1200  vancomycin (VANCOCIN) IVPB 1000 mg/200 mL premix  Status:  Discontinued     1,000 mg 200 mL/hr over 60 Minutes Intravenous Every M-W-F (Hemodialysis) 04/18/15 2318 04/19/15 0833   04/21/15 1200  vancomycin (VANCOCIN) IVPB 750 mg/150 ml premix  Status:  Discontinued     750 mg 150 mL/hr over 60 Minutes Intravenous Every M-W-F (Hemodialysis) 04/19/15 0833 04/22/15 1100   04/19/15 1200  vancomycin (VANCOCIN) 750 mg in sodium chloride 0.9 % 150 mL IVPB     750 mg 150 mL/hr over 60 Minutes Intravenous  Once 04/19/15 1158 04/19/15 1300   04/18/15 2330  piperacillin-tazobactam (ZOSYN) IVPB 2.25 g  Status:  Discontinued     2.25 g 100 mL/hr over 30 Minutes Intravenous 3 times per day 04/18/15 2318 04/22/15 1100   04/18/15 2330  vancomycin (VANCOCIN) 2,000 mg in sodium chloride 0.9 % 500 mL IVPB     2,000 mg 250 mL/hr over 120 Minutes Intravenous  Once 04/18/15 2318 04/19/15 0254         HPI/Subjective: Patient hypotensive during dialysis yesterday , denies any chest pain or shortness of breath  Objective: Filed Vitals:   04/24/15 0426 04/24/15 0733 04/24/15 1344 04/24/15 1553  BP: 150/77 143/68 152/76   Pulse: 100 99 99 101  Temp: 98 F (36.7 C) 98.5 F (36.9 C) 97.9 F (36.6 C)   TempSrc: Oral Oral Oral   Resp: 18 18 20    Height:      Weight: 72.4 kg (159 lb 9.8 oz)     SpO2: 100% 100% 100% 98%    Intake/Output Summary (Last 24 hours) at 04/24/15 1707 Last data filed at 04/24/15 1300  Gross per 24 hour  Intake    600 ml  Output      0 ml  Net    600 ml    Exam:  General: No acute respiratory distress Lungs: Clear to auscultation bilaterally without wheezes or crackles Cardiovascular: Regular rate and rhythm without murmur gallop or rub normal S1 and S2 Abdomen: Nontender, nondistended, soft, bowel sounds positive, no rebound, no ascites, no  appreciable mass Extremities: No significant cyanosis, clubbing, or edema bilateral lower extremities     Data Review   Micro Results Recent Results (from the past 240 hour(s))  MRSA PCR Screening     Status: None   Collection Time: 04/18/15 10:19 PM  Result Value Ref Range Status   MRSA by PCR NEGATIVE NEGATIVE Final    Comment:        The GeneXpert MRSA Assay (FDA approved for NASAL specimens only), is one component of a comprehensive MRSA colonization surveillance program. It is not intended to diagnose MRSA infection nor to guide or monitor treatment for MRSA infections.   Culture, blood (x 2)     Status: None   Collection Time: 04/19/15  1:12 AM  Result Value Ref Range Status   Specimen Description BLOOD LEFT HAND  Final   Special Requests IN PEDIATRIC BOTTLE [facility]  Final   Culture NO GROWTH 5 DAYS  Final   Report Status 04/24/2015 FINAL  Final  Culture, blood (routine x 2)     Status: None   Collection Time: 04/19/15  8:50 AM  Result Value Ref Range Status   Specimen Description BLOOD HEMODIALYSIS CATHETER  Final   Special Requests BOTTLES DRAWN AEROBIC AND ANAEROBIC 10CC  Final   Culture NO GROWTH 5 DAYS  Final   Report Status 04/24/2015 FINAL  Final  Culture, blood (routine x 2)     Status: None   Collection Time: 04/19/15  9:05 AM  Result Value Ref Range Status   Specimen Description BLOOD HEMODIALYSIS CATHETER  Final   Special Requests BOTTLES DRAWN AEROBIC AND ANAEROBIC 10CC  Final   Culture NO GROWTH 5 DAYS  Final   Report Status 04/24/2015 FINAL  Final  C difficile quick scan w PCR reflex     Status: None   Collection Time: 04/20/15  8:40 AM  Result Value Ref Range Status   C Diff antigen NEGATIVE NEGATIVE Final   C Diff toxin NEGATIVE NEGATIVE Final   C Diff interpretation Negative for toxigenic C. difficile  Final    Radiology Reports Ct Head Wo Contrast  04/22/2015   CLINICAL DATA:  74 year old male with confusion and lethargy.  EXAM: CT HEAD  WITHOUT CONTRAST  TECHNIQUE: Contiguous axial images were obtained from the base of the skull through the vertex without intravenous contrast.  COMPARISON:  None.  FINDINGS: Mild -moderate periventricular white matter hypodensities likely represent chronic small-vessel white matter ischemic changes.  No acute intracranial abnormalities are identified, including mass lesion or mass effect, hydrocephalus, extra-axial fluid collection, midline shift, hemorrhage, or acute infarction.  The visualized bony calvarium is unremarkable.  IMPRESSION: No evidence of acute intracranial abnormality.  Mild-moderate periventricular white matter hypodensities - likely chronic small-vessel white matter ischemic changes.   Electronically Signed   By: Margarette Canada M.D.   On: 04/22/2015 15:34   Dg Chest Port 1 View  04/22/2015   CLINICAL DATA:  Cough, chronic renal insufficiency  EXAM: PORTABLE CHEST - 1 VIEW  COMPARISON:  Chest x-ray of April 18, 2015  FINDINGS: The lungs are well-expanded. The interstitial markings are markedly improved. There is small right pleural effusion. The heart is mildly enlarged. The pulmonary vascularity is less engorged. The mediastinum is normal in width. The bony thorax exhibits no acute abnormality.  IMPRESSION: Continued improvement in pulmonary edema. There is a persistent small right pleural effusion.   Electronically Signed   By: David  Martinique M.D.   On: 04/22/2015 09:01   Dg Chest Port 1 View  04/18/2015   CLINICAL DATA:  74 year old male with shortness of breath, tachycardia. Renal failure on dialysis for 3 weeks. Initial encounter.  EXAM: PORTABLE CHEST - 1 VIEW  COMPARISON:  02/17/2015 and earlier.  FINDINGS: Portable AP semi upright view at 1605 hr. Tunneled right chest dialysis catheter is no longer present. Interval decreased but not resolved pleural effusions, residual more apparent on the right. Continued pulmonary vascular congestion, but also improved. Stable cardiac size and  mediastinal contours. No pneumothorax. No consolidation.  IMPRESSION: Improved but not resolved pulmonary edema and small pleural effusions since June.  Tunneled right chest dialysis catheter has been removed.   Electronically Signed   By: Genevie Ann M.D.   On: 04/18/2015 16:15     CBC  Recent Labs Lab 04/18/15 1625 04/19/15 1003 04/20/15 1100 04/21/15 0258 04/24/15 0336  WBC 15.1* 13.0*  14.6* 14.1* 12.7*  HGB 14.0 12.6* 13.8 12.1* 10.9*  HCT 44.4 39.0 44.1 38.2* 36.0*  PLT 119* 138* 147* 162 122*  MCV 91.4 88.6 92.3 90.3 94.5  MCH 28.8 28.6 28.9 28.6 28.6  MCHC 31.5 32.3 31.3 31.7 30.3  RDW 20.4* 20.3* 21.3* 21.3* 21.5*  LYMPHSABS 0.4*  --   --   --   --   MONOABS 1.6*  --   --   --   --   EOSABS 0.0  --   --   --   --   BASOSABS 0.1  --   --   --   --     Chemistries   Recent Labs Lab 04/19/15 0030 04/19/15 1005 04/20/15 1100 04/21/15 0900 04/23/15 0230 04/24/15 0336  NA 135 135 138 136 133* 134*  K >7.5* 4.8 4.8 3.7 4.3 3.2*  CL 96* 97* 94* 96* 97* 96*  CO2 17* 22 28 26 24 27   GLUCOSE 102* 105* 91 110* 88 112*  BUN 18 22* 23* 34* 36* 20  CREATININE 4.98* 5.29* 4.43* 5.49* 5.64* 4.26*  CALCIUM 8.4* 8.3* 8.6* 8.2* 8.4* 8.2*  AST 107*  --  50*  --  41 41  ALT 31  --  28  --  29 26  ALKPHOS 111  --  115  --  108 109  BILITOT 1.7*  --  0.6  --  0.7 0.8   ------------------------------------------------------------------------------------------------------------------ estimated creatinine clearance is 14.4 mL/min (by C-G formula based on Cr of 4.26). ------------------------------------------------------------------------------------------------------------------ No results for input(s): HGBA1C in the last 72 hours. ------------------------------------------------------------------------------------------------------------------ No results for input(s): CHOL, HDL, LDLCALC, TRIG, CHOLHDL, LDLDIRECT in the last 72  hours. ------------------------------------------------------------------------------------------------------------------ No results for input(s): TSH, T4TOTAL, T3FREE, THYROIDAB in the last 72 hours.  Invalid input(s): FREET3 ------------------------------------------------------------------------------------------------------------------ No results for input(s): VITAMINB12, FOLATE, FERRITIN, TIBC, IRON, RETICCTPCT in the last 72 hours.  Coagulation profile  Recent Labs Lab 04/19/15 0030  INR 1.15    No results for input(s): DDIMER in the last 72 hours.  Cardiac Enzymes  Recent Labs Lab 04/18/15 2204 04/21/15 1535  TROPONINI 0.56* 0.54*   ------------------------------------------------------------------------------------------------------------------ Invalid input(s): POCBNP   CBG:  Recent Labs Lab 04/18/15 2048 04/19/15 1427 04/19/15 1550 04/23/15 0908  GLUCAP 98 49* 98 91       Studies: No results found.    Lab Results  Component Value Date   HGBA1C 4.9 01/30/2015   HGBA1C 4.6* 01/29/2015   Lab Results  Component Value Date   CREATININE 4.26* 04/24/2015       Scheduled Meds: . ammonium lactate   Topical TID  . amoxicillin-clavulanate  1 tablet Oral Q2000  . antiseptic oral rinse  7 mL Mouth Rinse BID  . aspirin EC  81 mg Oral Daily  . atorvastatin  40 mg Oral q1800  . carvedilol  3.125 mg Oral BID WC  . dextrose  1 ampule Intravenous Once  . feeding supplement (NEPRO CARB STEADY)  237 mL Oral BID BM  . feeding supplement (PRO-STAT SUGAR FREE 64)  30 mL Oral BID  . guaiFENesin  600 mg Oral BID  . heparin subcutaneous  5,000 Units Subcutaneous 3 times per day  . insulin aspart  10 Units Intravenous Once  . multivitamin  1 tablet Oral QHS  . pantoprazole  40 mg Oral Daily  . sodium chloride  3 mL Intravenous Q12H   Continuous Infusions:    Principal Problem:   NSTEMI (non-ST elevated myocardial infarction) Active Problems:  Pulmonary fibrosis   HCAP (healthcare-associated pneumonia)   Sjogren's disease   DVT (deep venous thrombosis)   ESRD on dialysis   Acute on chronic diastolic CHF (congestive heart failure)   Accelerated hypertension   Protein-calorie malnutrition, severe    Time spent: 35 minutes   ELGERGAWY, DAWOOD  [facility] Pager [phone]. If 7PM-7AM, please contact night-coverage at [email or web address], password [facility] 04/24/2015, 5:07 PM  LOS: 6 days

## 2015-04-24 NOTE — Progress Notes (Signed)
Patient Name: Travis [name] Date of Encounter: 04/24/2015  Principal Problem:   NSTEMI (non-ST elevated myocardial infarction) Active Problems:   Pulmonary fibrosis   HCAP (healthcare-associated pneumonia)   Sjogren's disease   DVT (deep venous thrombosis)   ESRD on dialysis   Acute on chronic diastolic CHF (congestive heart failure)   Accelerated hypertension   Protein-calorie malnutrition, severe    SUBJECTIVE  Denies chest pain, sob or palpitation.   CURRENT MEDS . ammonium lactate   Topical TID  . amoxicillin-clavulanate  1 tablet Oral Q2000  . antiseptic oral rinse  7 mL Mouth Rinse BID  . aspirin EC  81 mg Oral Daily  . atorvastatin  40 mg Oral q1800  . dextrose  1 ampule Intravenous Once  . feeding supplement (NEPRO CARB STEADY)  237 mL Oral BID BM  . feeding supplement (PRO-STAT SUGAR FREE 64)  30 mL Oral BID  . guaiFENesin  600 mg Oral BID  . heparin subcutaneous  5,000 Units Subcutaneous 3 times per day  . insulin aspart  10 Units Intravenous Once  . multivitamin  1 tablet Oral QHS  . pantoprazole  40 mg Oral Daily  . sodium chloride  3 mL Intravenous Q12H    OBJECTIVE  Filed Vitals:   04/23/15 1327 04/23/15 1953 04/24/15 0426 04/24/15 0733  BP: 137/74 139/71 150/77 143/68  Pulse: 94 102 100 99  Temp: 97.4 F (36.3 C) 98.3 F (36.8 C) 98 F (36.7 C) 98.5 F (36.9 C)  TempSrc: Axillary Oral Oral Oral  Resp: 18 18 18 18   Height:      Weight:   159 lb 9.8 oz (72.4 kg)   SpO2: 100% 100% 100% 100%    Intake/Output Summary (Last 24 hours) at 04/24/15 1112 Last data filed at 04/24/15 0900  Gross per 24 hour  Intake    600 ml  Output  -1100 ml  Net   1700 ml   Filed Weights   04/23/15 0840 04/23/15 1229 04/24/15 0426  Weight: 161 lb 13.1 oz (73.4 kg) 163 lb 12.8 oz (74.3 kg) 159 lb 9.8 oz (72.4 kg)    PHYSICAL EXAM  General:ill appearing male in NAD. Neuro: Alert and oriented X 3. Moves all extremities spontaneously. Psych: Normal  affect. HEENT:  Normal  Neck: Supple without bruits or JVD. Lungs:  Resp regular and unlabored, CTA. Heart: RRR no s3, s4, or murmurs. Abdomen: Soft, non-tender, non-distended, BS + x 4.  Extremities: No clubbing, cyanosis or edema. DP/PT/Radials 2+ and equal bilaterally.  Accessory Clinical Findings  CBC  Recent Labs  04/24/15 0336  WBC 12.7*  HGB 10.9*  HCT 36.0*  MCV 94.5  PLT [id number]*   Basic Metabolic Panel  Recent Labs  04/23/15 0230 04/24/15 0336  NA 133* 134*  K 4.3 3.2*  CL 97* 96*  CO2 24 27  GLUCOSE 88 112*  BUN 36* 20  CREATININE 5.64* 4.26*  CALCIUM 8.4* 8.2*   Liver Function Tests  Recent Labs  04/23/15 0230 04/24/15 0336  AST 41 41  ALT 29 26  ALKPHOS 108 109  BILITOT 0.7 0.8  PROT 5.9* 6.1*  ALBUMIN 1.9* 2.1*   No results for input(s): LIPASE, AMYLASE in the last 72 hours. Cardiac Enzymes  Recent Labs  04/21/15 1535  TROPONINI 0.54*     TELE  NSR with PVCs  Radiology/Studies  Ct Head Wo Contrast  04/22/2015   CLINICAL DATA:  74 year old male with confusion and lethargy.  EXAM: CT HEAD WITHOUT  CONTRAST  TECHNIQUE: Contiguous axial images were obtained from the base of the skull through the vertex without intravenous contrast.  COMPARISON:  None.  FINDINGS: Mild -moderate periventricular white matter hypodensities likely represent chronic small-vessel white matter ischemic changes.  No acute intracranial abnormalities are identified, including mass lesion or mass effect, hydrocephalus, extra-axial fluid collection, midline shift, hemorrhage, or acute infarction.  The visualized bony calvarium is unremarkable.  IMPRESSION: No evidence of acute intracranial abnormality.  Mild-moderate periventricular white matter hypodensities - likely chronic small-vessel white matter ischemic changes.   Electronically Signed   By: Margarette Canada M.D.   On: 04/22/2015 15:34   Dg Chest Port 1 View  04/22/2015   CLINICAL DATA:  Cough, chronic renal insufficiency   EXAM: PORTABLE CHEST - 1 VIEW  COMPARISON:  Chest x-ray of April 18, 2015  FINDINGS: The lungs are well-expanded. The interstitial markings are markedly improved. There is small right pleural effusion. The heart is mildly enlarged. The pulmonary vascularity is less engorged. The mediastinum is normal in width. The bony thorax exhibits no acute abnormality.  IMPRESSION: Continued improvement in pulmonary edema. There is a persistent small right pleural effusion.   Electronically Signed   By: David  Martinique M.D.   On: 04/22/2015 09:01   Dg Chest Port 1 View  04/18/2015   CLINICAL DATA:  74 year old male with shortness of breath, tachycardia. Renal failure on dialysis for 3 weeks. Initial encounter.  EXAM: PORTABLE CHEST - 1 VIEW  COMPARISON:  02/17/2015 and earlier.  FINDINGS: Portable AP semi upright view at 1605 hr. Tunneled right chest dialysis catheter is no longer present. Interval decreased but not resolved pleural effusions, residual more apparent on the right. Continued pulmonary vascular congestion, but also improved. Stable cardiac size and mediastinal contours. No pneumothorax. No consolidation.  IMPRESSION: Improved but not resolved pulmonary edema and small pleural effusions since June.  Tunneled right chest dialysis catheter has been removed.   Electronically Signed   By: Genevie Ann M.D.   On: 04/18/2015 16:15    ASSESSMENT AND PLAN   1. New onset renal failure 2. Elevated troponin 3. Acute systolic HF with EF [id number] on echo. Chest x-ray shows significant improvement in CHF 4. Acute respiratory failure, improved 5. Recent DVT s/p IVC filter  6. Acute delirium, improved 7. HTN. Low BP during HD.   Plan: His carvedilol reduced to 6.25 mg BID yesterday due to hypotension during HD. Then discontinued hydralazine/ coreg later in a day. Now high BP is borderline high with pulse of 100s. Will resume with coreg 3.125mg  BID. Titrate further as BP allows.   - Seen by Palliative care yesterday -  see note for details - plan to discharge with HHPT.   Jarrett Soho PA-C Pager [phone]  Agree with above assessment and plan. Restarting low dose coreg.

## 2015-04-24 NOTE — Evaluation (Signed)
Occupational Therapy Evaluation Patient Details Name: Travis [name] MRN: [id number] DOB: 02/28/1941 Today's Date: 04/24/2015    History of Present Illness 74 y.o. male Patient is presented with chest pain as well as progressively worsening shortness of breath likely secondary to acute on chronic heart failure. Admitted for NSTEMI, Acute on chronic diastolic heart failure likely secondary to volume overload in the setting of ESRD, accelerated hypertension, and Possible healthcare related pneumonia with pulmonary fibrosis.   Clinical Impression   Patient presenting with deconditioning and decreased ADL, IADL, functional mobility independence secondary to above. Patient mod I with mobility PTA and up to min assist at times for ADLs PTA. 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 Patient currently functioning at an overall min>mod assist level, +2 for safety. Patient will benefit from acute OT to increase overall independence in the areas of ADLs, functional mobility, and overall safety in order to safely discharge home with assistance from family and [place]. Educated patient, wife, and son on vinegar soaks to help with dry skin, techniques to help decrease swelling/edema in UEs, use of bed pad/chuck to aid & assist with bed mobility, and functional use of 3-in-1 (beside bed, over toilet, and in shower).      Follow Up Recommendations  Home health OT;Supervision/Assistance - 24 hour    Equipment Recommendations  3 in 1 bedside comode    Recommendations for Other Services  None at this time   Precautions / Restrictions Precautions Precautions: Fall Precaution Comments: monitor HR Restrictions Weight Bearing Restrictions: No    Mobility Bed Mobility Overal bed mobility: Needs Assistance Bed Mobility: Supine to Sit;Sit to Supine     Supine to sit: Mod assist;HOB elevated Sit to supine: Mod assist;HOB elevated   General bed mobility comments: assistance with management  of BLEs  Transfers Overall transfer level: Needs assistance Equipment used: Rolling walker (2 wheeled) Transfers: Sit to/from [facility] Sit to Stand: Mod assist;+2 safety/equipment Stand pivot transfers: Mod assist;+2 safety/equipment       General transfer comment: Mod assist to power up. Pt with poor posture and required max cueing to stand up straight. Max cueing also needed for hand placement, sequencing, and technique during transfer.     Balance Overall balance assessment: Needs assistance Sitting-balance support: No upper extremity supported;Feet supported Sitting balance-Leahy Scale: Fair     Standing balance support: Bilateral upper extremity supported;During functional activity Standing balance-Leahy Scale: Poor    ADL Overall ADL's : Needs assistance/impaired General ADL Comments: Pt overall min>mod assist with ADLs at this time. Pt engaged in bed mobility, sat EOB and performed stand pivot transfer onto [facility] using RW with mod assist (+2 for safety). HR:110 during activity and 02 sats:98% on 2 liters of 02.     Pertinent Vitals/Pain Pain Assessment: No/denies pain Pain Score: 0-No pain     Hand Dominance Left   Extremity/Trunk Assessment Upper Extremity Assessment Upper Extremity Assessment: Generalized weakness   Lower Extremity Assessment Lower Extremity Assessment: Generalized weakness       Communication Communication Communication: No difficulties   Cognition Arousal/Alertness: Awake/alert Behavior During Therapy: WFL for tasks assessed/performed Overall Cognitive Status: Within Functional Limits for tasks assessed (slow processing and following commands)              Home Living Family/patient expects to be discharged to:: Private residence Living Arrangements: Spouse/significant other Available Help at Discharge: Family;Available 24 hours/day (wife and son) Type of Home: House Home Access: Stairs to enter Entrance  Stairs-Number of Steps: 5 Entrance Stairs-Rails: Right;Left;Can reach both Home Layout: Two level;Laundry or work area in basement;Able to live on main level with bedroom/bathroom     Bathroom Shower/Tub: Gaffer;Door   [employer] Toilet: Standard     Home Equipment: Environmental consultant - 2 wheels;Bedside commode   Prior Functioning/Environment Level of Independence: Needs assistance  Gait / Transfers Assistance Needed: using RW modified independent; assist on steps ADL's / Homemaking Assistance Needed: wife helps pt as needed. Some days he can bathe and dress some days he needs assist.   Comments: Pt retired end of March '16.  Was a phys ed Pharmacist, hospital and privately taught line dancing, kickboxing, and karate.      OT Diagnosis: Generalized weakness   OT Problem List: Decreased strength;Decreased activity tolerance;Impaired balance (sitting and/or standing);Decreased safety awareness;Decreased knowledge of use of DME or AE;Cardiopulmonary status limiting activity   OT Treatment/Interventions: Self-care/ADL training;Therapeutic exercise;Energy conservation;DME and/or AE instruction;Therapeutic activities;Patient/family education;Balance training    OT Goals(Current goals can be found in the care plan section) Acute Rehab OT Goals Patient Stated Goal: none stated OT Goal Formulation: With patient/family Time For Goal Achievement: 05/08/15 Potential to Achieve Goals: Good ADL Goals Pt Will Perform Grooming: with supervision;standing Pt Will Perform Upper Body Bathing: with supervision;sitting Pt Will Perform Lower Body Bathing: with min assist;sit to/from stand Pt Will Perform Upper Body Dressing: with supervision;sitting Pt Will Perform Lower Body Dressing: with min assist;sit to/from stand Pt Will Transfer to Toilet: bedside commode;stand pivot transfer;with supervision Pt Will Perform Tub/Shower Transfer: with min assist;rolling walker;3 in 1;ambulating;Shower transfer Pt/caregiver will  Perform Home Exercise Program: Both right and left upper extremity;With Supervision;With written HEP provided  OT Frequency: Min 2X/week   Barriers to D/C: None known at this time   End of Session Equipment Utilized During Treatment: Gait belt;Rolling walker;Oxygen Nurse Communication: Mobility status;Other (comment) (open sores on lower back, suspect due to scratching?)  Activity Tolerance: Patient tolerated treatment well Patient left: in bed;with call bell/phone within reach;with nursing/sitter in room;with family/visitor present   Time: [id number] OT Time Calculation (min): 23 min Charges:  OT General Charges $OT Visit: 1 Procedure OT Evaluation $Initial OT Evaluation Tier I: 1 Procedure OT Treatments $Self Care/Home Management : 8-22 mins  Alaiza Yau , MS, OTR/L, CLT Pager: [phone]  04/24/2015, 9:01 AM

## 2015-04-24 NOTE — Progress Notes (Signed)
Utilization review completed.  

## 2015-04-24 NOTE — Progress Notes (Signed)
Physical Therapy Treatment Patient Details Name: Travis [name] MRN: [id number] DOB: 03/15/1941 Today's Date: 04/24/2015    History of Present Illness 74 y.o. male Patient is presented with chest pain as well as progressively worsening shortness of breath likely secondary to acute on chronic heart failure. Admitted for NSTEMI, Acute on chronic diastolic heart failure likely secondary to volume overload in the setting of ESRD, accelerated hypertension, and Possible healthcare related pneumonia with pulmonary fibrosis.    PT Comments    Patient able to transfer with one assist, but increased time and effort with all mobility.  Reports family will be able to provide this level of assist at home.  Definitely needs wheelchair and hospital bed for safest mobility upon d/c home.  Follow Up Recommendations  Home health PT;Supervision/Assistance - 24 hour     Equipment Recommendations  Wheelchair (measurements PT);Wheelchair cushion (measurements PT);Hospital bed    Recommendations for Other Services       Precautions / Restrictions Precautions Precautions: Fall Precaution Comments: monitor HR    Mobility  Bed Mobility Overal bed mobility: Needs Assistance Bed Mobility: Rolling;Sidelying to Sit Rolling: Supervision Sidelying to sit: Min assist;Mod assist       General bed mobility comments: cues for rolling and use of railing, increased time given, support through stages of side to sit  Transfers Overall transfer level: Needs assistance Equipment used: Rolling walker (2 wheeled) Transfers: Sit to/from [facility] Sit to Stand: Mod assist;From elevated surface Stand pivot transfers: Mod assist       General transfer comment: increased time and cues for safety backing up to chair due to fatigue and wanting to sit prior to getting to chair; maintains flexed posture in standing  Ambulation/Gait                 Stairs            Wheelchair  Mobility    Modified Rankin (Stroke Patients Only)       Balance     Sitting balance-Leahy Scale: Fair Sitting balance - Comments: but poor overal trunk strength exhibted by flexed head, neck and trunk in sitting edge of bed   Standing balance support: Bilateral upper extremity supported Standing balance-Leahy Scale: Poor Standing balance comment: knees flexed in standing despite cues, UE support and min/mod assist for balance                    Cognition Arousal/Alertness: Awake/alert Behavior During Therapy: Flat affect Overall Cognitive Status: No family/caregiver present to determine baseline cognitive functioning Area of Impairment: Problem solving;Following commands       Following Commands: Follows one step commands with increased time     Problem Solving: Slow processing;Decreased initiation;Requires verbal cues;Requires tactile cues      Exercises      General Comments General comments (skin integrity, edema, etc.): multiple areas of skin with evidence of frequent scratching, pt asked to scratch his back, rubbed and applied cream at bedside      Pertinent Vitals/Pain Pain Score: 0-No pain    Home Living                      Prior Function            PT Goals (current goals can now be found in the care plan section) Progress towards PT goals: Progressing toward goals    Frequency  Min 3X/week    PT Plan Current plan remains appropriate  Co-evaluation             End of Session Equipment Utilized During Treatment: Gait belt Activity Tolerance: Patient limited by fatigue Patient left: in chair;with call bell/phone within reach     Time: [id number] PT Time Calculation (min) (ACUTE ONLY): 24 min  Charges:  $Therapeutic Activity: 23-37 mins                    G Codes:      Karolina Zamor,CYNDI 2015/05/05, 3:59 PM  Magda Kiel, [place] 2015/05/05

## 2015-04-25 DIAGNOSIS — I953 Hypotension of hemodialysis: Secondary | ICD-10-CM | POA: Clinically undetermined

## 2015-04-25 DIAGNOSIS — J189 Pneumonia, unspecified organism: Secondary | ICD-10-CM

## 2015-04-25 DIAGNOSIS — I5021 Acute systolic (congestive) heart failure: Secondary | ICD-10-CM

## 2015-04-25 LAB — BASIC METABOLIC PANEL
ANION GAP: 11 (ref 5–15)
BUN: 30 mg/dL — ABNORMAL HIGH (ref 6–20)
CHLORIDE: 97 mmol/L — AB (ref 101–111)
CO2: 27 mmol/L (ref 22–32)
Calcium: 8.5 mg/dL — ABNORMAL LOW (ref 8.9–10.3)
Creatinine, Ser: 5.86 mg/dL — ABNORMAL HIGH (ref 0.61–1.24)
GFR calc non Af Amer: 9 mL/min — ABNORMAL LOW (ref >60)
GFR, EST AFRICAN AMERICAN: 10 mL/min — AB (ref >60)
Glucose, Bld: 84 mg/dL (ref 65–99)
POTASSIUM: 3.5 mmol/L (ref 3.5–5.1)
SODIUM: 135 mmol/L (ref 135–145)

## 2015-04-25 LAB — CBC
HEMATOCRIT: 36 % — AB (ref 39.0–52.0)
HEMOGLOBIN: 11.1 g/dL — AB (ref 13.0–17.0)
MCH: 29.1 pg (ref 26.0–34.0)
MCHC: 30.8 g/dL (ref 30.0–36.0)
MCV: 94.2 fL (ref 78.0–100.0)
Platelets: 163 10*3/uL (ref 150–400)
RBC: 3.82 MIL/uL — ABNORMAL LOW (ref 4.22–5.81)
RDW: 21.3 % — ABNORMAL HIGH (ref 11.5–15.5)
WBC: 11.7 10*3/uL — AB (ref 4.0–10.5)

## 2015-04-25 MED ORDER — DIPHENHYDRAMINE HCL 25 MG PO CAPS
25.0000 mg | ORAL_CAPSULE | ORAL | Status: DC | PRN
Start: 2015-04-25 — End: 2015-05-17

## 2015-04-25 MED ORDER — LANOLIN HYDROUS EX OINT: 1.0000 "application " | TOPICAL_OINTMENT | CUTANEOUS | Status: DC | PRN

## 2015-04-25 MED ORDER — PRO-STAT SUGAR FREE PO LIQD: 30.0000 mL | Freq: Two times a day (BID) | ORAL | Status: DC

## 2015-04-25 MED ORDER — CARVEDILOL 3.125 MG PO TABS: 3.1250 mg | ORAL_TABLET | Freq: Two times a day (BID) | ORAL | Status: DC

## 2015-04-25 MED ORDER — CARVEDILOL 3.125 MG PO TABS: 6.2500 mg | ORAL_TABLET | Freq: Two times a day (BID) | ORAL | Status: DC

## 2015-04-25 NOTE — Progress Notes (Signed)
Nutrition Follow-up  DOCUMENTATION CODES:   Severe malnutrition in context of chronic illness  INTERVENTION:    Continue Nepro Shake po BID, each supplement provides 425 kcal and 19 grams protein   Continue Prostat liquid protein po 30 ml BID with meals, each supplement provides 100 kcal, 15 grams protein  NEW NUTRITION DIAGNOSIS:   Increased nutrient needs related to chronic illness as evidenced by estimated needs, ongoing  GOAL:   Patient will meet greater than or equal to 90% of their needs, unmet  MONITOR:   PO intake, Supplement acceptance, Labs, Weight trends, I & O's  ASSESSMENT:   74 y.o. male with PMH of ESRD on hemodialysis, GERD, pulmonary fibrosis, Sjogren's disease, chronic diastolic dysfunction, HTN; presented with SOB.  Pt sleeping upon RD visit.  PO intake poor at 20-25% per flowsheet records.  Palliative Care Team following.  Receiving Nepro Shake & Prostat liquid protein BID.    Noted pt with discharge orders.  Diet Order:  Diet renal with fluid restriction Fluid restriction:: 1200 mL Fluid; Room service appropriate?: Yes; Fluid consistency:: Thin Diet - low sodium heart healthy  Skin:  Reviewed, no issues  Last BM:  8/11  Height:   Ht Readings from Last 1 Encounters:  04/18/15 5\' 7"  (1.702 m)    Weight:   Wt Readings from Last 1 Encounters:  04/24/15 159 lb 9.8 oz (72.4 kg)    Ideal Body Weight:  75.5 kg  BMI:  Body mass index is 24.99 kg/(m^2).  Estimated Nutritional Needs:   Kcal:  1800-2000  Protein:  90-100 gm  Fluid:  1.8-2.0 L  EDUCATION NEEDS:   Education needs no appropriate at this time  Arthur Holms, RD, LDN Pager #: [phone] After-Hours Pager #: [phone]

## 2015-04-25 NOTE — Progress Notes (Signed)
Patient refused CPAP for tonight 

## 2015-04-25 NOTE — Care Management Note (Signed)
Case Management Note Marvetta Gibbons RN, BSN Unit 2W-Case Manager [phone]  Patient Details  Name: Travis [name] MRN: [id number] Date of Birth: 06-Aug-1941  Subjective/Objective:   Pt admitted with NSTEMI               Action/Plan: PTA Pt lived at home with spouse- recently started on HD- pt active with [facility] -RN/PT/OT/SW- per conversation with pt's wife and son at bedside (pt was in HD) they would like to continue services with [facility]- however PT/OT evals are pending and they have questions regarding recommendations and if pt may need rehab- DME at home include RW, cane, 3n1- they are interested in a hospital bed but do not feel pt needs w/c. - also discussed that pt is a [place]- pt state that they have received bill from last admit and wonder if VA paid- instructed them that I would not know this info and they would need to call the # on back of bill to discuss this also advised them to contact the [place] regarding the bill from last visit. NCM to f/u once PT/OT sees pt after HD  Expected Discharge Date:       04/25/15           Expected Discharge Plan:  [place]  In-House Referral:     Discharge planning Services  CM Consult  Post Acute Care Choice:  [place], Resumption of Svcs/PTA Provider, Durable Medical Equipment Choice offered to:  Patient, Spouse, Adult Children  DME Arranged:  Hospital bed, Wheelchair manual DME Agency:  [place] Arranged:  RN, PT, OT [facility] Agency:  [place]  Status of Service:  Completed, signed off  Medicare Important Message Given:  Yes-third notification given Date Medicare IM Given:    Medicare IM give by:    Date Additional Medicare IM Given:    Additional Medicare Important Message give by:     If discussed at [place] of Stay Meetings, dates discussed:    Additional Comments:  04/25/15- [id number]- received call from family- now requesting a w/c for home- have sent MD a page to request DME for  wheelchair- order placed - have contacted [place] with [facility] who will f/u and have wheelchair delivered to home.   04/25/15- pt for d/c home today- order for hospital bed placed- have spoken with Jermaine with [facility]- regarding DME need and plan for d/c today (wife's cell is [phone])- [facility] resumption orders have also been placed and Santiago Glad with [employer] has been notified of pt's discharge and [place] orders. Have updated pt and family on [place] information- spoke with Cecille Rubin at the [facility] who states that the pt is 40% connected with the [place] and that any billing questions should be directed to the [facility]- have given that # to the family- pt goes to the [place] [facility] PCP is Dr. Henrene Pastor his SW is Anderson Malta Mischler [phone] ext [phone]) if needed. Per Cecille Rubin at the [facility]- pt's [facility] insurance would be billed first as the [place] is not the primary coverage for this hospitalization- this was explained to pt/family. Encouraged them to f/u with the [place] for any further questions related to pt's VA coverage on past and present hospital stays.   04/24/15- f/u done with pt, wife and son at bedside regarding d/c needs and plan- PT/OT evals completed and recommending HH- pt states that he does not want to go to [facility]- he wants to return home with  HH and continue to go to HD as he has been doing from home- discussed again DME needs for home pt and family would like hospital bed- no other DME needs noted. Pt active with [facility]- and would like to continue [facility]- will need resumption orders for RN/PT/OT/SW-  Family again asked about VA benefits- will call [place] and ask about pt's service connection-   Dahlia Client, Romeo Rabon, RN 04/25/2015, 3:23 PM

## 2015-04-25 NOTE — Care Management Important Message (Signed)
Important Message  Patient Details  Name: [name] MRN: [id number] Date of Birth: 1940/12/17   Medicare Important Message Given:  Yes-third notification given    Nathen May 04/25/2015, 12:25 [place] Message  Patient Details  Name: Travis [name] MRN: [id number] Date of Birth: 1941/06/09   Medicare Important Message Given:  Yes-third notification given    Nathen May 04/25/2015, 12:25 PM

## 2015-04-25 NOTE — Progress Notes (Signed)
1340 Noted pt is NSTEMI but is not appropriate for Phase 1 or 2 at this time. Graylon Good RN BSN 04/25/2015 1:37 PM

## 2015-04-25 NOTE — Care Management Note (Signed)
Case Management Note Marvetta Gibbons RN, BSN Unit 2W-Case Manager [phone]  Patient Details  Name: Travis [name] MRN: [id number] Date of Birth: [birth year]  Subjective/Objective:   Pt admitted with NSTEMI               Action/Plan: PTA Pt lived at home with spouse- recently started on HD- pt active with [facility] -RN/PT/OT/SW- per conversation with pt's wife and son at bedside (pt was in HD) they would like to continue services with [facility]- however PT/OT evals are pending and they have questions regarding recommendations and if pt may need rehab- DME at home include RW, cane, 3n1- they are interested in a hospital bed but do not feel pt needs w/c. - also discussed that pt is a [place]- pt state that they have received bill from last admit and wonder if VA paid- instructed them that I would not know this info and they would need to call the # on back of bill to discuss this also advised them to contact the [place] regarding the bill from last visit. NCM to f/u once PT/OT sees pt after HD  Expected Discharge Date:       04/25/15           Expected Discharge Plan:  [place]  In-House Referral:     Discharge planning Services  CM Consult  Post Acute Care Choice:  Home Health, Resumption of Svcs/PTA Provider, Durable Medical Equipment Choice offered to:  Patient, Spouse, Adult Children  DME Arranged:  Hospital bed DME Agency:  [place]:  RN, PT, OT [facility] Agency:  [place]  Status of Service:  Completed, signed off  Medicare Important Message Given:  Yes-second notification given Date Medicare IM Given:    Medicare IM give by:    Date Additional Medicare IM Given:    Additional Medicare Important Message give by:     If discussed at [place] of Stay Meetings, dates discussed:    Additional Comments:  04/25/15- pt for d/c home today- order for hospital bed placed- have spoken with Jermaine with [facility]- regarding DME need and plan for d/c  today (wife's cell is [phone])- [facility] resumption orders have also been placed and Santiago Glad with [facility] has been notified of pt's discharge and [place] orders. Have updated pt and family on [place] information- spoke with Cecille Rubin at the [facility] who states that the pt is 40% connected with the [place] and that any billing questions should be directed to the [facility]- have given that # to the family- pt goes to the [place] [facility] PCP is Dr. Henrene Pastor his SW is Anderson Malta Mischler [phone] ext [phone]) if needed. Per Cecille Rubin at the [facility]- pt's [facility] insurance would be billed first as the [place] is not the primary coverage for this hospitalization- this was explained to pt/family. Encouraged them to f/u with the [place] for any further questions related to pt's VA coverage on past and present hospital stays.   04/24/15- f/u done with pt, wife and son at bedside regarding d/c needs and plan- PT/OT evals completed and recommending HH- pt states that he does not want to go to [facility]- he wants to return home with [facility] and continue to go to HD as he has been doing from home- discussed again DME needs for home pt and family would like hospital bed- no other DME needs noted. Pt active with [facility]- and would like to continue [facility]- will  need resumption orders for RN/PT/OT/SW-  Family again asked about VA benefits- will call [place] and ask about pt's service connection-   Dahlia Client, Romeo Rabon, RN 04/25/2015, 10:30 AM

## 2015-04-25 NOTE — Discharge Summary (Signed)
Physician Discharge Summary  Travis [name] [id number] DOB: 29-Jun-1941 DOA: 04/18/2015  PCP: [name], MD  Admit date: 04/18/2015 Discharge date: 04/25/2015  Time spent: 35 minutes  Recommendations for Outpatient Follow-up:  1. Discharge home with home health RN,, social work and Doctor, hospital. Hospital bed provided. 2. Follow-up with scheduled dialysis in cardiology (appointment scheduled) 3. If blood pressure remains stable during dialysis his hydralazine and Imdur need to be resumed. 4. Complete 7 days of oral Augmentin on 8/18.  Discharge Diagnoses:  Principal Problem:   NSTEMI (non-ST elevated myocardial infarction)   Active Problems:   Pulmonary fibrosis   HCAP (healthcare-associated pneumonia)   Sjogren's disease   DVT (deep venous thrombosis)   ESRD on dialysis   Acute systolic CHF   Accelerated hypertension   Protein-calorie malnutrition, severe   Hypotension of hemodialysis   Discharge Condition: Fair  Diet recommendation: Heart healthy/renal  Filed Weights   04/23/15 0840 04/23/15 1229 04/24/15 0426  Weight: 73.4 kg (161 lb 13.1 oz) 74.3 kg (163 lb 12.8 oz) 72.4 kg (159 lb 9.8 oz)    History of present illness:  74 y.o. 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 He is on dialysis MWF at [facility]. He went for his routine dialysis on 04/17/2015, which was aborted after 2 hours and 40 minutes due to progressive dyspnea and tachycardia.  He was seen in the ED for progressive dyspnea, was hypoxemia with O2sats of 89%, was tachycardic (115-120) and hypertensive (160's/100's). He was also complaining of a rash of about 10 days duration. Chest x ray showed vascular congestion.  Troponin was elevated at 0.68. Had new anterolateral changes on ECG with question of new ischemia.  Patient found to have acute systolic CHF.   Hospital Course:  NSTEMI (non-ST elevated myocardial infarction),  - Cardiology consulted for elevated troponin. He  was initially started on heparin drip but recommended for medical therapy. He was placed on Coreg, hydralazine and Imdur which were held due to episode of hypotension during dialysis. Blood pressure is currently improved and Coreg has been resumed at low dose. - Palliative care consulted and discussed goals of care. - 2-D echo shows EF of 35-40%, no regional wall motion abnormalities.  Acute on chronic systolic heart failure likely secondary to volume overload in the setting of ESRD - improving with hemodialysis - Hold Imdur, hydralazine , lowering Coreg due to hypotension during dialysis. . Imdur and hydralazine can be resumed as outpatient if BP remains stable.  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 -has appointment with [facility]. Prescribed ointment and prn benadryl for itching.    Possible healthcare related pneumonia with pulmonary fibrosis. - Dr. Allyson Sabal discussed with the critical care. diffuse pulmonary fibrosis component is very mild. - off vancomycin and Zosyn which was started empirically  for healthcare associated pneumonia. Being discharged on 7 days of Augmentin. - SLP consult to rule out aspiration, mild risk per SLP.  H/o DVT - s/p IVC filter 02/2015 - not on anticoagulation as outpt, venous Doppler negative  Diarrhea - C. difficile negative . slowly improved. 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 They do understand his severe comorbidity but would like to see the rheumatologist and understand his prognosis. He is currently full code.    Procedures:  2-D echo  Consultations:  Cardiology  Renal  Pallor to care  Discharge Exam: Filed Vitals:   04/25/15 0542  BP: 143/69  Pulse: 98  Temp: 98.7 F (37.1 C)  Resp: 20    General: Elderly male lying in bed appears fatigued, not in distress HEENT: No pallor, moist oral mucosa, supple neck Chest: Clear to auscultation bilaterally, no added sounds CVS: Normal S1 and S2, no murmurs rub or gallop GI: Soft, nondistended, nontender, bowel sounds present Musculoskeletal: Warm, no edema, diffuse coarse skin changes over the extremities CNS: Alert and oriented, nonfocal  Discharge Instructions   Discharge Instructions    Diet - low sodium heart healthy    Complete by:  As directed      Increase activity slowly    Complete by:  As directed           Current Discharge Medication List    START taking these medications   Details  Amino Acids-Protein Hydrolys (FEEDING SUPPLEMENT, PRO-STAT SUGAR FREE 64,) LIQD Take 30 mLs by mouth 2 (two) times daily. Qty: 900 mL, Refills: 0    amoxicillin-clavulanate (AUGMENTIN) 500-125 MG per tablet Take 1 tablet (500 mg total) by mouth daily at 8 pm. Qty: 7 tablet, Refills: 0    aspirin EC 81 MG EC tablet Take 1 tablet (81 mg total) by mouth daily. Qty: 30 tablet, Refills: 0    atorvastatin (LIPITOR) 40 MG tablet Take 1 tablet (40 mg total) by mouth daily at 6 PM. Qty: 30 tablet, Refills: 0    carvedilol (COREG) 3.125 MG tablet Take 1 tablet (3.125 mg total) by mouth 2 (two) times daily with a meal. Qty: 60 tablet, Refills: 0         guaiFENesin (MUCINEX) 600 MG 12 hr tablet Take 1 tablet (600 mg total) by mouth 2 (two) times daily. Qty: 20 tablet, Refills: 0    lanolin OINT Apply 1 application topically as  needed (itching dry skin). Refills: 0    Nutritional Supplements (FEEDING SUPPLEMENT, NEPRO CARB STEADY,) LIQD Take 237 mLs by mouth 2 (two) times daily between meals. Qty: 237 mL, Refills: 0        CONTINUE these medications which have CHANGED   Details  traMADol-acetaminophen (ULTRACET) 37.5-325 MG per tablet Take 1-2 tablets by mouth every 4 (four) hours as needed for severe pain. Qty: 30 tablet, Refills: 0      CONTINUE these medications which have NOT CHANGED   Details  cyanocobalamin 500 MCG tablet Take 1 tablet (500 mcg total) by mouth daily. Qty: 30 tablet, Refills: 0    hydrOXYzine (ATARAX/VISTARIL) 10 MG tablet Take 1 tablet (10 mg total) by mouth 3 (three) times daily as needed. Qty: 30 tablet, Refills: 0    multivitamin (RENA-VIT) TABS tablet Take 1 tablet by mouth daily.    pantoprazole (PROTONIX) 40 MG tablet Take 1 tablet (40 mg total) by mouth daily. Qty: 30 tablet, Refills: 1    triamcinolone cream (KENALOG) 0.1 % Apply 1 application topically 2 (two) times daily.    white petrolatum (VASELINE) GEL Apply 1 application topically as needed for dry skin.    ferrous sulfate 325 (65 FE) MG tablet Take  1 tablet (325 mg total) by mouth 2 (two) times daily with a meal. Qty: 60 tablet, Refills: 0      STOP taking these medications     furosemide (LASIX) 40 MG tablet        No Known Allergies Follow-up Information    Follow up with SIMMONS, BRITTAINY, PA-C On 05/16/2015.   Specialties:  Cardiology, Radiology   Why:  @10 :00 cardiology f/u   Contact information:   [place] [phone]       Please follow up.   Why:  has appointment with rheumatology at [facility] on 8/18       The results of significant diagnostics from this hospitalization (including imaging, microbiology, ancillary and laboratory) are listed below for reference.    Significant Diagnostic Studies: Ct Head Wo Contrast  04/22/2015   CLINICAL DATA:   74 year old male with confusion and lethargy.  EXAM: CT HEAD WITHOUT CONTRAST  TECHNIQUE: Contiguous axial images were obtained from the base of the skull through the vertex without intravenous contrast.  COMPARISON:  None.  FINDINGS: Mild -moderate periventricular white matter hypodensities likely represent chronic small-vessel white matter ischemic changes.  No acute intracranial abnormalities are identified, including mass lesion or mass effect, hydrocephalus, extra-axial fluid collection, midline shift, hemorrhage, or acute infarction.  The visualized bony calvarium is unremarkable.  IMPRESSION: No evidence of acute intracranial abnormality.  Mild-moderate periventricular white matter hypodensities - likely chronic small-vessel white matter ischemic changes.   Electronically Signed   By: Margarette Canada M.D.   On: 04/22/2015 15:34   Dg Chest Port 1 View  04/22/2015   CLINICAL DATA:  Cough, chronic renal insufficiency  EXAM: PORTABLE CHEST - 1 VIEW  COMPARISON:  Chest x-ray of April 18, 2015  FINDINGS: The lungs are well-expanded. The interstitial markings are markedly improved. There is small right pleural effusion. The heart is mildly enlarged. The pulmonary vascularity is less engorged. The mediastinum is normal in width. The bony thorax exhibits no acute abnormality.  IMPRESSION: Continued improvement in pulmonary edema. There is a persistent small right pleural effusion.   Electronically Signed   By: David  Martinique M.D.   On: 04/22/2015 09:01   Dg Chest Port 1 View  04/18/2015   CLINICAL DATA:  74 year old male with shortness of breath, tachycardia. Renal failure on dialysis for 3 weeks. Initial encounter.  EXAM: PORTABLE CHEST - 1 VIEW  COMPARISON:  02/17/2015 and earlier.  FINDINGS: Portable AP semi upright view at 1605 hr. Tunneled right chest dialysis catheter is no longer present. Interval decreased but not resolved pleural effusions, residual more apparent on the right. Continued pulmonary vascular  congestion, but also improved. Stable cardiac size and mediastinal contours. No pneumothorax. No consolidation.  IMPRESSION: Improved but not resolved pulmonary edema and small pleural effusions since June.  Tunneled right chest dialysis catheter has been removed.   Electronically Signed   By: Genevie Ann M.D.   On: 04/18/2015 16:15    Microbiology: Recent Results (from the past 240 hour(s))  MRSA PCR Screening     Status: None   Collection Time: 04/18/15 10:19 PM  Result Value Ref Range Status   MRSA by PCR NEGATIVE NEGATIVE Final    Comment:        The GeneXpert MRSA Assay (FDA approved for NASAL specimens only), is one component of a comprehensive MRSA colonization surveillance program. It is not intended to diagnose MRSA infection nor to guide or monitor treatment for MRSA  infections.   Culture, blood (x 2)     Status: None   Collection Time: 04/19/15  1:12 AM  Result Value Ref Range Status   Specimen Description BLOOD LEFT HAND  Final   Special Requests IN PEDIATRIC BOTTLE 2CC  Final   Culture NO GROWTH 5 DAYS  Final   Report Status 04/24/2015 FINAL  Final  Culture, blood (routine x 2)     Status: None   Collection Time: 04/19/15  8:50 AM  Result Value Ref Range Status   Specimen Description BLOOD HEMODIALYSIS CATHETER  Final   Special Requests BOTTLES DRAWN AEROBIC AND ANAEROBIC 10CC  Final   Culture NO GROWTH 5 DAYS  Final   Report Status 04/24/2015 FINAL  Final  Culture, blood (routine x 2)     Status: None   Collection Time: 04/19/15  9:05 AM  Result Value Ref Range Status   Specimen Description BLOOD HEMODIALYSIS CATHETER  Final   Special Requests BOTTLES DRAWN AEROBIC AND ANAEROBIC 10CC  Final   Culture NO GROWTH 5 DAYS  Final   Report Status 04/24/2015 FINAL  Final  C difficile quick scan w PCR reflex     Status: None   Collection Time: 04/20/15  8:40 AM  Result Value Ref Range Status   C Diff antigen NEGATIVE NEGATIVE Final   C Diff toxin NEGATIVE NEGATIVE Final    C Diff interpretation Negative for toxigenic C. difficile  Final     Labs: Basic Metabolic Panel:  Recent Labs Lab 04/20/15 1100 04/21/15 0900 04/23/15 0230 04/24/15 0336 04/25/15 0413  NA 138 136 133* 134* 135  K 4.8 3.7 4.3 3.2* 3.5  CL 94* 96* 97* 96* 97*  CO2 28 26 24 27 27   GLUCOSE 91 110* 88 112* 84  BUN 23* 34* 36* 20 30*  CREATININE 4.43* 5.49* 5.64* 4.26* 5.86*  CALCIUM 8.6* 8.2* 8.4* 8.2* 8.5*  PHOS  --  3.9  --   --   --    Liver Function Tests:  Recent Labs Lab 04/19/15 0030 04/20/15 1100 04/21/15 0900 04/23/15 0230 04/24/15 0336  AST 107* 50*  --  41 41  ALT 31 28  --  29 26  ALKPHOS 111 115  --  108 109  BILITOT 1.7* 0.6  --  0.7 0.8  PROT 7.0 6.9  --  5.9* 6.1*  ALBUMIN 2.4* 2.2* 1.9* 1.9* 2.1*   No results for input(s): LIPASE, AMYLASE in the last 168 hours. No results for input(s): AMMONIA in the last 168 hours. CBC:  Recent Labs Lab 04/18/15 1625 04/19/15 1003 04/20/15 1100 04/21/15 0258 04/24/15 0336 04/25/15 0413  WBC 15.1* 13.0* 14.6* 14.1* 12.7* 11.7*  NEUTROABS 13.1*  --   --   --   --   --   HGB 14.0 12.6* 13.8 12.1* 10.9* 11.1*  HCT 44.4 39.0 44.1 38.2* 36.0* 36.0*  MCV 91.4 88.6 92.3 90.3 94.5 94.2  PLT 119* 138* 147* 162 122* 163   Cardiac Enzymes:  Recent Labs Lab 04/18/15 2204 04/21/15 1535  TROPONINI 0.56* 0.54*   BNP: BNP (last 3 results)  Recent Labs  01/02/15 1241 01/06/15 1758 04/18/15 1626  BNP 66.6 83.3 >4500.0*    ProBNP (last 3 results) No results for input(s): PROBNP in the last 8760 hours.  CBG:  Recent Labs Lab 04/18/15 2048 04/19/15 1427 04/19/15 1550 04/23/15 0908  GLUCAP 98 49* 98 91       Signed:  Roniya Tetro  [facility] 04/25/2015, 9:40  AM    

## 2015-04-26 NOTE — Progress Notes (Signed)
Pt unable to be discharged this evening. Per wife pt wheelchair has not been sent to home by [facility]; pt cannot stand and will be unable to get out of car w/o the wheelchair. RN called [facility] after-hours number. RN spoke with a member of [employer] and was informed that someone would be calling back soon regarding the wheelchair. No call back. Pt updated and RN attempted to call wife to update her; no call back. RN will cont to monitor.

## 2015-04-26 NOTE — Progress Notes (Signed)
Patient could not go home yesterday as home health had not delivered wheelchair at home. As per his sone , it will be delivered today. Tolerated HD yesterday . Vitals stable except for drop in BP during HD yesterday.  No overnight issues.  will continue to hold hydralazine and imdur given drop in BP with HD and have him monitored as outpt. Stable for d/c home today.

## 2015-04-26 NOTE — Progress Notes (Signed)
Pt discharged home.  Alert and oriented x4.  No c/o pain. Education given to pt and son on diet, activity, meds, and follow-up care and appointments.  Pt and son verbalized understanding.  Pt home health wheelchair was delivered.  IV D/Cd.  Tele. D/Cd.

## 2015-04-28 ENCOUNTER — Encounter (HOSPITAL_COMMUNITY): Payer: Self-pay

## 2015-04-28 ENCOUNTER — Emergency Department (HOSPITAL_COMMUNITY)
Admission: EM | Admit: 2015-04-28 | Discharge: 2015-04-28 | Disposition: A | Discharge status: Home / Self Care | Payer: Medicare Other | Attending: Emergency Medicine | Admitting: Emergency Medicine

## 2015-04-28 ENCOUNTER — Emergency Department (HOSPITAL_COMMUNITY): Payer: Medicare Other

## 2015-04-28 DIAGNOSIS — I9589 Other hypotension: Secondary | ICD-10-CM

## 2015-04-28 DIAGNOSIS — R55 Syncope and collapse: Secondary | ICD-10-CM | POA: Diagnosis not present

## 2015-04-28 DIAGNOSIS — M1990 Unspecified osteoarthritis, unspecified site: Secondary | ICD-10-CM | POA: Diagnosis not present

## 2015-04-28 DIAGNOSIS — N186 End stage renal disease: Secondary | ICD-10-CM | POA: Diagnosis not present

## 2015-04-28 DIAGNOSIS — I120 Hypertensive chronic kidney disease with stage 5 chronic kidney disease or end stage renal disease: Secondary | ICD-10-CM | POA: Diagnosis not present

## 2015-04-28 DIAGNOSIS — Z8701 Personal history of pneumonia (recurrent): Secondary | ICD-10-CM | POA: Diagnosis not present

## 2015-04-28 DIAGNOSIS — K219 Gastro-esophageal reflux disease without esophagitis: Secondary | ICD-10-CM | POA: Diagnosis not present

## 2015-04-28 DIAGNOSIS — Z79899 Other long term (current) drug therapy: Secondary | ICD-10-CM | POA: Insufficient documentation

## 2015-04-28 DIAGNOSIS — I5033 Acute on chronic diastolic (congestive) heart failure: Secondary | ICD-10-CM | POA: Diagnosis not present

## 2015-04-28 DIAGNOSIS — Z7982 Long term (current) use of aspirin: Secondary | ICD-10-CM | POA: Diagnosis not present

## 2015-04-28 DIAGNOSIS — Z992 Dependence on renal dialysis: Secondary | ICD-10-CM | POA: Diagnosis not present

## 2015-04-28 LAB — I-STAT TROPONIN, ED: TROPONIN I, POC: 0.4 ng/mL — AB (ref 0.00–0.08)

## 2015-04-28 LAB — CBC WITH DIFFERENTIAL/PLATELET
Basophils Absolute: 0.1 10*3/uL (ref 0.0–0.1)
Basophils Relative: 1 % (ref 0–1)
Eosinophils Absolute: 0.9 10*3/uL — ABNORMAL HIGH (ref 0.0–0.7)
Eosinophils Relative: 7 % — ABNORMAL HIGH (ref 0–5)
HCT: 37.7 % — ABNORMAL LOW (ref 39.0–52.0)
Hemoglobin: 12 g/dL — ABNORMAL LOW (ref 13.0–17.0)
Lymphocytes Relative: 3 % — ABNORMAL LOW (ref 12–46)
Lymphs Abs: 0.4 10*3/uL — ABNORMAL LOW (ref 0.7–4.0)
MCH: 29 pg (ref 26.0–34.0)
MCHC: 31.8 g/dL (ref 30.0–36.0)
MCV: 91.1 fL (ref 78.0–100.0)
Monocytes Absolute: 1.4 10*3/uL — ABNORMAL HIGH (ref 0.1–1.0)
Monocytes Relative: 11 % (ref 3–12)
Neutro Abs: 10.1 10*3/uL — ABNORMAL HIGH (ref 1.7–7.7)
Neutrophils Relative %: 78 % — ABNORMAL HIGH (ref 43–77)
Platelets: 201 10*3/uL (ref 150–400)
RBC: 4.14 MIL/uL — ABNORMAL LOW (ref 4.22–5.81)
RDW: 19.9 % — ABNORMAL HIGH (ref 11.5–15.5)
WBC: 12.9 10*3/uL — ABNORMAL HIGH (ref 4.0–10.5)

## 2015-04-28 LAB — BASIC METABOLIC PANEL WITH GFR
Anion gap: 12 (ref 5–15)
BUN: 28 mg/dL — ABNORMAL HIGH (ref 6–20)
CO2: 28 mmol/L (ref 22–32)
Calcium: 8.5 mg/dL — ABNORMAL LOW (ref 8.9–10.3)
Chloride: 96 mmol/L — ABNORMAL LOW (ref 101–111)
Creatinine, Ser: 6.85 mg/dL — ABNORMAL HIGH (ref 0.61–1.24)
GFR calc Af Amer: 8 mL/min — ABNORMAL LOW
GFR calc non Af Amer: 7 mL/min — ABNORMAL LOW
Glucose, Bld: 93 mg/dL (ref 65–99)
Potassium: 4.4 mmol/L (ref 3.5–5.1)
Sodium: 136 mmol/L (ref 135–145)

## 2015-04-28 LAB — CK TOTAL AND CKMB (NOT AT ARMC)
CK, MB: 6.8 ng/mL — ABNORMAL HIGH (ref 0.5–5.0)
Relative Index: 5.9 — ABNORMAL HIGH (ref 0.0–2.5)
Total CK: 116 U/L (ref 49–397)

## 2015-04-28 LAB — CBG MONITORING, ED: Glucose-Capillary: 70 mg/dL (ref 65–99)

## 2015-04-28 NOTE — Consult Note (Signed)
CARDIOLOGY CONSULT NOTE   Patient ID: Travis [name] MRN: [id number] DOB/AGE: [birth year] 74 y.o.  Admit date: 04/28/2015  Primary Physician   [name], MD Primary Cardiologist   New (Dr. Mare Ferrari) Reason for Consultation  Syncope  HPI: Travis [name] is a 74 y.o. 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   He was hospitalized for CAP (4/5-4/14) with repeat admission for HCAP (4/25-4/28, treated with IV vancomycin and cefepime at that time), chest CT findings were suggestive of pleural effusions and pulmonary fibrosis, which led to a rheumatological work-up. The patient was found to be ANA neg, but SSA and SSB pos, Rf pos. He was discharged on prednisone and has rheumatology follow-up.  He was readmitted on 02/17/2015 a DVT R leg below the knee.Admitted and started on IV heparin. He was found to be in renal failure which was new and nephrology was consulted. 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  Again admitted 04/18/15-04/25/15 due to progressive dyspnea and tachycardia during routine dialysis which was aborted after 2 hours and 40 minutes. Patient found to have acute on chronic systolic CHF in setting of ESRD. Patient had a episode of hypotensive episode during dialysis, held hypertensive meds, BP improved, discharged on low dose coreg. 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 He has appointment with rheumatology at [facility] on 8/18. He was also placed on abx for possible healthcare related pneumonia with pulmonary fibrosis. Patient also had flat elevated troponin trend ~0.5, initially started on heparin drip but recommended for  medical therapy. 2-D echo shows EF of 35-40%, no regional wall motion abnormalities. Goals of care discussed with Palliative care.   He was doing well over weekend at home. He Presented 04/28/15  for evaluation following a syncopal episode at dialysis. Pt. Received approx 25 min of txt today when dialysis center states he had full syncopal episode. Pt. Was lying in bed. Pt. BP 50 systolic per dialysis center, given 1L bolus at dialysis center. BP now 140/80. Patient does not recall syncopal episode. His only complain is bilateral hip pain and he stated he was angry at dialysis center because of his hip pain and no one is addressing this issue. He denies chest pain, sob, palpitations, Le edema, orthopnea, dizziness or PND.   WOC consulted for perineal care.   Past Medical History  Diagnosis Date  . Medical history non-contributory   . Arthritis   . Pneumonia   . GERD (gastroesophageal reflux disease)   . Sjogren's disease 01/28/2015  . Pulmonary fibrosis   . 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 Received plasma exchange and steroids but didn't respond and ended up starting hemodialysis June 2016.   Marland Kitchen Acute on chronic diastolic CHF (congestive heart failure) 04/18/2015  . Accelerated hypertension 04/19/2015     Past Surgical History  Procedure Laterality Date  . Hemorroidectomy  1999  . Surgical procedure to remove a mole as a child Right Eye area    At around 57 years old  . Insertion of dialysis catheter N/A 02/17/2015    Procedure: INSERTION OF DIALYSIS CATHETER RIGHT INTERNAL JUGULAR VEIN;  Surgeon: Elam Dutch, MD;  Location: [facility];  Service: Vascular;  Laterality: N/A;  . Av fistula placement Right 02/17/2015    Procedure: INSERTION OF RIGHT ARM  ARTERIOVENOUS (AV) GORE-TEX GRAFT ;  Surgeon: Elam Dutch, MD;  Location: [facility] OR;  Service: Vascular;  Laterality: Right;    No Known Allergies  I have reviewed  the patient's current medications     Prior to Admission medications   Medication Sig Start Date End Date Taking? Authorizing Provider  Amino Acids-Protein Hydrolys (FEEDING SUPPLEMENT, PRO-STAT SUGAR FREE 64,) LIQD Take 30 mLs by mouth 2 (two) times daily. 04/25/15  Yes Nishant Dhungel, MD  amoxicillin-clavulanate (AUGMENTIN) 500-125 MG per tablet Take 1 tablet (500 mg total) by mouth daily at 8 pm. 04/23/15  Yes Reyne Dumas, MD  aspirin EC 81 MG EC tablet Take 1 tablet (81 mg total) by mouth daily. 04/23/15  Yes Reyne Dumas, MD  atorvastatin (LIPITOR) 40 MG tablet Take 1 tablet (40 mg total) by mouth daily at 6 PM. 04/23/15  Yes Reyne Dumas, MD  carvedilol (COREG) 3.125 MG tablet Take 1 tablet (3.125 mg total) by mouth 2 (two) times daily with a meal. 04/25/15  Yes Nishant Dhungel, MD  cyanocobalamin 500 MCG tablet Take 1 tablet (500 mcg total) by mouth daily. 12/26/14  Yes Donne Hazel, MD  diphenhydrAMINE (BENADRYL) 25 mg capsule Take 1 capsule (25 mg total) by mouth every 4 (four) hours as needed for itching. 04/25/15  Yes Nishant Dhungel, MD  ferrous sulfate 325 (65 FE) MG tablet Take 1 tablet (325 mg total) by mouth 2 (two) times daily with a meal. 12/26/14  Yes Donne Hazel, MD  guaiFENesin (MUCINEX) 600 MG 12 hr tablet Take 1 tablet (600 mg total) by mouth 2 (two) times daily. 04/23/15  Yes Reyne Dumas, MD  hydrOXYzine (ATARAX/VISTARIL) 10 MG tablet Take 1 tablet (10 mg total) by mouth 3 (three) times daily as needed. 02/19/15  Yes Kelvin Cellar, MD  lanolin OINT Apply 1 application topically as needed (itching dry skin). 04/25/15  Yes Nishant Dhungel, MD  multivitamin (RENA-VIT) TABS tablet Take 1 tablet by mouth daily.   Yes Historical Provider, MD  Nutritional Supplements (FEEDING SUPPLEMENT, NEPRO CARB STEADY,) LIQD Take 237 mLs by mouth 2 (two) times daily between meals. 04/23/15  Yes Reyne Dumas, MD  pantoprazole (PROTONIX) 40 MG tablet Take 1 tablet (40 mg total) by mouth daily.  01/31/15  Yes Barton Dubois, MD  traMADol-acetaminophen (ULTRACET) 37.5-325 MG per tablet Take 1-2 tablets by mouth every 4 (four) hours as needed for severe pain. 04/23/15  Yes Reyne Dumas, MD  triamcinolone cream (KENALOG) 0.1 % Apply 1 application topically 2 (two) times daily.   Yes Historical Provider, MD  white petrolatum (VASELINE) GEL Apply 1 application topically as needed for dry skin.   Yes Historical Provider, MD     Social History   Social History  . Marital Status: Married    Spouse Name: N/A  . Number of Children: N/A  . Years of Education: N/A   Occupational History  . Not on file.   Social History Main Topics  . Smoking status: Never Smoker   . Smokeless tobacco: Never Used  . Alcohol Use: No  . Drug Use: No  . Sexual Activity: Not on file   Other Topics Concern  . Not on file   Social History Narrative    No family status information on file.   Family History  Problem Relation Age of Onset  . Hypertension Mother  ROS:  Full 14 point review of systems complete and found to be negative unless listed above.  Physical Exam: Blood pressure 139/55, pulse 96, temperature 98.2 F (36.8 C), temperature source Oral, resp. rate 18, SpO2 100 %.  General: Well developed, well nourished, male in no acute distress Head: Eyes PERRLA, No xanthomas. Normocephalic and atraumatic, oropharynx without edema or exudate.  Lungs: Resp regular and unlabored, CTA. Heart: RRR no s3, s4, or murmurs. Neck: No carotid bruits. No lymphadenopathy.  JVD. Abdomen: Bowel sounds present, abdomen soft and non-tender without masses or hernias noted. Msk:  No spine or cva tenderness. Bilateral hip pain with minimal movement.  Extremities: No clubbing, cyanosis or edema. DP/PT/Radials 2+ and equal bilaterally. Neuro: Alert and oriented X 3. No focal deficits noted. Psych:  Good affect, responds appropriately Skin: Scattered skin changes.   Labs:   Lab Results  Component Value Date    WBC 12.9* 04/28/2015   HGB 12.0* 04/28/2015   HCT 37.7* 04/28/2015   MCV 91.1 04/28/2015   PLT 201 04/28/2015   No results for input(s): INR in the last 72 hours.  Recent Labs Lab 04/24/15 0336  04/28/15 1422  NA 134*  < > 136  K 3.2*  < > 4.4  CL 96*  < > 96*  CO2 27  < > 28  BUN 20  < > 28*  CREATININE 4.26*  < > 6.85*  CALCIUM 8.2*  < > 8.5*  PROT 6.1*  --   --   BILITOT 0.8  --   --   ALKPHOS 109  --   --   ALT 26  --   --   AST 41  --   --   GLUCOSE 112*  < > 93  ALBUMIN 2.1*  --   --   < > = values in this interval not displayed. MAGNESIUM  Date Value Ref Range Status  02/19/2015 1.8 1.7 - 2.4 mg/dL Final    Recent Labs  04/28/15 1422  CKTOTAL 116  CKMB 6.8*    Echo: LV EF: 35% -  40%  ------------------------------------------------------------------- Indications:   Chest pain 786.51.  ------------------------------------------------------------------- History:  Risk factors: Hypertension.  ------------------------------------------------------------------- Study Conclusions  - Left ventricle: The cavity size was normal. There was mild concentric hypertrophy. Systolic function was moderately reduced. The estimated ejection fraction was in the range of 35% to 40%. Wall motion was normal; there were no regional wall motion abnormalities. - Aortic valve: Trileaflet; mildly thickened leaflets. There was mild regurgitation. - Mitral valve: There was mild regurgitation. - Pericardium, extracardiac: A trivial, free-flowing pericardial effusion was identified along the right atrial free wall. The fluid had no internal echoes.   ECG:  Vent. rate 90 BPM PR interval 169 ms QRS duration 92 ms QT/QTc 391/478 ms P-R-T axes 73 -67 131  Radiology:  Dg Chest 2 View  04/28/2015   CLINICAL DATA:  Tachypnea today. Short of breath. Initial encounter.  EXAM: CHEST  2 VIEW  COMPARISON:  04/22/2015.  FINDINGS: The cardiopericardial silhouette  is enlarged. There is interstitial pulmonary edema. Basilar atelectasis. Monitoring leads project over the chest. No airspace consolidation. Probable small bilateral pleural effusions with blunting of both costophrenic angles. This appears to be a chronic finding. IVC filter is incidentally noted.  IMPRESSION: Mild CHF with small bilateral pleural effusions and interstitial pulmonary edema.   Electronically Signed   By: Dereck Ligas M.D.   On: 04/28/2015 15:29    ASSESSMENT AND PLAN:  1. Syncope - Patient had received approx 25 min of txt today at dialysis center when he had syncope episode. He was lying in bed. Unknown duration of syncope episode. His SBP 50 systolic per dialysis center, improved with 1L bolus fluid. Likely his episode due to hypotension. Less likely arrhthymias.   2. Elevated troponin - POC Trop of 0.40 and CKMB of 6.8. Trop trending down from previous admission  (0.68->0.56->0.54). Likely trop is chronically elevated due to ESRD. Now trending down. EKG non specific T wave abnormality, no significant change from previous tracing.  -Discontinue coreg. Will sign off. Call with a questions.   3. Perinea wound - per wound care  4. ESRD - Need close monitoring during HD. He will need dialysis today. His BP was low doing HD, during recent admission (04/23/15)  SignedLeanor Kail, PA 04/28/2015, 4:27 PM   Co-Sign MD  Personally seen and examined. Agree with above. 74 year old male with end-stage renal disease, underlying cardiomyopathy sent to the emergency room after syncopal episode in hemodialysis with hypotension.  Recommendation is to discontinue carvedilol 3.125 g twice a day.  He responded to fluid hydration.  EKG demonstrates T-wave inversion, similar to prior EKG.  Troponin is elevated but trending downward when compared to prior.  Chronically ill-appearing.  No further cardiac workup at this point.  Could consider Midodrine if  necessary.  Plan discussed with ER and patient.  We'll sign off. Please let us know if we can be of further assistance. His mildly elevated troponin and multiple comorbid state places of at high risk for mortality.  Candee Furbish, MD

## 2015-04-28 NOTE — Consult Note (Signed)
[place] consulted, discussed with bedside nurse, sounds mostly like MASD from sitting and moisture. Recommended use of barrier cream, patient most likely will be admitted, will follow up if admitted inpatient for any other needs.   [place], [place]

## 2015-04-28 NOTE — Discharge Instructions (Signed)
1. Medications: usual home medications, discontinue carvedilol per cardiology 2. Treatment: rest, drink plenty of fluids 3. Follow Up: please followup with your primary doctor this week for discussion of your diagnoses and further evaluation after today's visit; please return to the ER for chest pain, shortness of breath, syncope, new or worsening symptoms   Syncope Syncope is a medical term for fainting or passing out. This means you lose consciousness and drop to the ground. People are generally unconscious for less than 5 minutes. You may have some muscle twitches for up to 15 seconds before waking up and returning to normal. Syncope occurs more often in older adults, but it can happen to anyone. While most causes of syncope are not dangerous, syncope can be a sign of a serious medical problem. It is important to seek medical care.  CAUSES  Syncope is caused by a sudden drop in blood flow to the brain. The specific cause is often not determined. Factors that can bring on syncope include:  Taking medicines that lower blood pressure.  Sudden changes in posture, such as standing up quickly.  Taking more medicine than prescribed.  Standing in one place for too long.  Seizure disorders.  Dehydration and excessive exposure to heat.  Low blood sugar (hypoglycemia).  Straining to have a bowel movement.  Heart disease, irregular heartbeat, or other circulatory problems.  Fear, emotional distress, seeing blood, or severe pain. SYMPTOMS  Right before fainting, you may:  Feel dizzy or light-headed.  Feel nauseous.  See all white or all black in your field of vision.  Have cold, clammy skin. DIAGNOSIS  Your health care provider will ask about your symptoms, perform a physical exam, and perform an electrocardiogram (ECG) to record the electrical activity of your heart. Your health care provider may also perform other heart or blood tests to determine the cause of your syncope which may  include:  Transthoracic echocardiogram (TTE). During echocardiography, sound waves are used to evaluate how blood flows through your heart.  Transesophageal echocardiogram (TEE).  Cardiac monitoring. This allows your health care provider to monitor your heart rate and rhythm in real time.  Holter monitor. This is a portable device that records your heartbeat and can help diagnose heart arrhythmias. It allows your health care provider to track your heart activity for several days, if needed.  Stress tests by exercise or by giving medicine that makes the heart beat faster. TREATMENT  In most cases, no treatment is needed. Depending on the cause of your syncope, your health care provider may recommend changing or stopping some of your medicines. HOME CARE INSTRUCTIONS  Have someone stay with you until you feel stable.  Do not drive, use machinery, or play sports until your health care provider says it is okay.  Keep all follow-up appointments as directed by your health care provider.  Lie down right away if you start feeling like you might faint. Breathe deeply and steadily. Wait until all the symptoms have passed.  Drink enough fluids to keep your urine clear or pale yellow.  If you are taking blood pressure or heart medicine, get up slowly and take several minutes to sit and then stand. This can reduce dizziness. SEEK IMMEDIATE MEDICAL CARE IF:   You have a severe headache.  You have unusual pain in the chest, abdomen, or back.  You are bleeding from your mouth or rectum, or you have black or tarry stool.  You have an irregular or very fast heartbeat.  You  have pain with breathing.  You have repeated fainting or seizure-like jerking during an episode.  You faint when sitting or lying down.  You have confusion.  You have trouble walking.  You have severe weakness.  You have vision problems. If you fainted, call your local emergency services (911 in [place] Do not drive  yourself to the hospital.  MAKE SURE YOU:  Understand these instructions.  Will watch your condition.  Will get help right away if you are not doing well or get worse. Document Released: 08/30/2005 Document Revised: 09/04/2013 Document Reviewed: 10/29/2011 [facility] Patient Information 2015 [place], [place]. This information is not intended to replace advice given to you by your health care provider. Make sure you discuss any questions you have with your health care provider.

## 2015-04-28 NOTE — ED Provider Notes (Signed)
The patient is a 74 year old male, he currently is on dialysis Monday Wednesday and Friday. He had a recent admission to the hospital for elevated troponin and non-ST elevation MI. He presents today from dialysis after 25 minutes of dialysis with a syncopal event. His blood pressure was extremely low approximately 60/40, this improved with a 1 L bolus at dialysis. He arrives to the hospital with his dialysis catheters in place. His blood pressure has improved to normal, his exam is concerning only for mild tachypnea, no peripheral edema, soft nontender abdomen, dry mucous membranes (chronic). We'll obtain labs and a chest x-ray to evaluate for other sources of the patient's hypotension and syncope, doubt this is related to postdialysis hypotension as the patient only had 25 minutes of dialysis.   EKG Interpretation  Date/Time:  Monday April 28 2015 13:33:12 EDT Ventricular Rate:  90 PR Interval:  169 QRS Duration: 92 QT Interval:  391 QTC Calculation: 478 R Axis:   -67 Text Interpretation:  Sinus rhythm Left anterior fascicular block Abnormal T, consider ischemia, anterior leads Baseline wander in lead(s) V3 since last tracing no significant change Confirmed by Shristi Scheib  MD, Wolf Boulay ([id number]) on 04/28/2015 1:39:36 PM      Medical screening examination/treatment/procedure(s) were conducted as a shared visit with non-physician practitioner(s) and myself.  I personally evaluated the patient during the encounter.  Clinical Impression:   Final diagnoses:  Syncope, unspecified syncope type         Noemi Chapel, MD 04/30/15 [phone]

## 2015-04-28 NOTE — ED Provider Notes (Signed)
CSN: [id number]     Arrival date & time 04/28/15  1316 History   First MD Initiated Contact with Patient 04/28/15 1338     Chief Complaint  Patient presents with  . Loss of Consciousness    HPI   74 year old male with a PMH of pulmonary fibrosis, ESRD on hemodialysis, HTN, NSTEMI 04/2015 who presents to the ED s/p syncopal episode in dialysis today. Per report, he had received approximately 25 minutes of treatment, when he had a syncopal episode while lying in bed, at which time his systolic BP was noted to be 50. He was given 1L bolus of fluid at the dialysis center, and on arrival in the ED, BP was 140/69. He denies headache, dizziness, lightheadedness, chest pain, palpitations shortness of breath, abdominal pain, nausea, vomiting. He reports hip pain, and states he feels like he "is being torn apart."   Past Medical History  Diagnosis Date  . Medical history non-contributory   . Arthritis   . Pneumonia   . GERD (gastroesophageal reflux disease)   . Sjogren's disease 01/28/2015  . Pulmonary fibrosis   . 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 Received plasma exchange and steroids but didn't respond and ended up starting hemodialysis June 2016.   Marland Kitchen Acute on chronic diastolic CHF (congestive heart failure) 04/18/2015  . Accelerated hypertension 04/19/2015   Past Surgical History  Procedure Laterality Date  . Hemorroidectomy  1999  . Surgical procedure to remove a mole as a child Right Eye area    At around 81 years old  . Insertion of dialysis catheter N/A 02/17/2015    Procedure: INSERTION OF DIALYSIS CATHETER RIGHT INTERNAL JUGULAR VEIN;  Surgeon: Elam Dutch, MD;  Location: [facility];  Service: Vascular;  Laterality: N/A;  . Av fistula placement Right 02/17/2015    Procedure: INSERTION OF RIGHT ARM  ARTERIOVENOUS (AV) GORE-TEX GRAFT ;  Surgeon: Elam Dutch, MD;  Location: [facility] OR;  Service: Vascular;   Laterality: Right;   Family History  Problem Relation Age of Onset  . Hypertension Mother    Social History  Substance Use Topics  . Smoking status: Never Smoker   . Smokeless tobacco: Never Used  . Alcohol Use: No     Review of Systems  Constitutional: Negative for fever, chills, activity change and appetite change.  HENT: Negative for congestion.   Eyes: Negative for visual disturbance.  Respiratory: Negative for shortness of breath.   Cardiovascular: Negative for chest pain, palpitations and leg swelling.  Gastrointestinal: Negative for nausea, vomiting, abdominal pain, diarrhea, constipation and abdominal distention.  Musculoskeletal: Positive for arthralgias. Negative for myalgias, back pain, neck pain and neck stiffness.  Skin: Positive for wound. Negative for color change, pallor and rash.  Neurological: Positive for syncope. Negative for dizziness, tremors, facial asymmetry, speech difficulty, weakness, light-headedness, numbness and headaches.      Allergies  Review of patient's allergies indicates no known allergies.  Home Medications   Prior to Admission medications   Medication Sig Start Date End Date Taking? Authorizing Provider  Amino Acids-Protein Hydrolys (FEEDING SUPPLEMENT, PRO-STAT SUGAR FREE 64,) LIQD Take 30 mLs by mouth 2 (two) times daily. 04/25/15  Yes Nishant Dhungel, MD  amoxicillin-clavulanate (AUGMENTIN) 500-125 MG per tablet Take 1 tablet (500 mg total) by mouth daily at 8 pm. 04/23/15  Yes Reyne Dumas, MD  aspirin EC 81 MG EC tablet Take 1 tablet (  81 mg total) by mouth daily. 04/23/15  Yes Reyne Dumas, MD  atorvastatin (LIPITOR) 40 MG tablet Take 1 tablet (40 mg total) by mouth daily at 6 PM. 04/23/15  Yes Reyne Dumas, MD  carvedilol (COREG) 3.125 MG tablet Take 1 tablet (3.125 mg total) by mouth 2 (two) times daily with a meal. 04/25/15  Yes Nishant Dhungel, MD  cyanocobalamin 500 MCG tablet Take 1 tablet (500 mcg total) by mouth daily. 12/26/14   Yes Donne Hazel, MD  diphenhydrAMINE (BENADRYL) 25 mg capsule Take 1 capsule (25 mg total) by mouth every 4 (four) hours as needed for itching. 04/25/15  Yes Nishant Dhungel, MD  ferrous sulfate 325 (65 FE) MG tablet Take 1 tablet (325 mg total) by mouth 2 (two) times daily with a meal. 12/26/14  Yes Donne Hazel, MD  guaiFENesin (MUCINEX) 600 MG 12 hr tablet Take 1 tablet (600 mg total) by mouth 2 (two) times daily. 04/23/15  Yes Reyne Dumas, MD  hydrOXYzine (ATARAX/VISTARIL) 10 MG tablet Take 1 tablet (10 mg total) by mouth 3 (three) times daily as needed. 02/19/15  Yes Kelvin Cellar, MD  lanolin OINT Apply 1 application topically as needed (itching dry skin). 04/25/15  Yes Nishant Dhungel, MD  multivitamin (RENA-VIT) TABS tablet Take 1 tablet by mouth daily.   Yes Historical Provider, MD  Nutritional Supplements (FEEDING SUPPLEMENT, NEPRO CARB STEADY,) LIQD Take 237 mLs by mouth 2 (two) times daily between meals. 04/23/15  Yes Reyne Dumas, MD  pantoprazole (PROTONIX) 40 MG tablet Take 1 tablet (40 mg total) by mouth daily. 01/31/15  Yes Barton Dubois, MD  traMADol-acetaminophen (ULTRACET) 37.5-325 MG per tablet Take 1-2 tablets by mouth every 4 (four) hours as needed for severe pain. 04/23/15  Yes Reyne Dumas, MD  triamcinolone cream (KENALOG) 0.1 % Apply 1 application topically 2 (two) times daily.   Yes Historical Provider, MD  white petrolatum (VASELINE) GEL Apply 1 application topically as needed for dry skin.   Yes Historical Provider, MD     BP 134/63 mmHg  Pulse 97  Temp(Src) 98.2 F (36.8 C) (Oral)  Resp 24  SpO2 100% Physical Exam  Constitutional: He is oriented to person, place, and time.  Thin appearing male, in no acute distress.  HENT:  Head: Normocephalic and atraumatic.  Right Ear: External ear normal.  Left Ear: External ear normal.  Mouth/Throat: Oropharynx is clear and moist.  Eyes: Conjunctivae are normal. Pupils are equal, round, and reactive to light.  Neck:  Normal range of motion. Neck supple. No spinous process tenderness and no muscular tenderness present.  Cardiovascular: Normal rate, regular rhythm, normal heart sounds and intact distal pulses.   Pulmonary/Chest: Effort normal and breath sounds normal. No respiratory distress. He has no wheezes. He has no rales. He exhibits no tenderness.  Abdominal: Soft. Bowel sounds are normal. He exhibits no distension and no mass. There is no tenderness. There is no rebound and no guarding.  Musculoskeletal: Normal range of motion. He exhibits no edema or tenderness.  No tenderness to palpation of hips bilaterally.  Lymphadenopathy:    He has no cervical adenopathy.  Neurological: He is alert and oriented to person, place, and time. He has normal strength. No cranial nerve deficit or sensory deficit. GCS eye subscore is 4. GCS verbal subscore is 5. GCS motor subscore is 6.  Skin: Skin is warm and dry. No rash noted. No erythema. No pallor.  Skin breakdown over ischium bilaterally.   Psychiatric: He has a  normal mood and affect. His speech is normal and behavior is normal. Thought content normal.  Nursing note and vitals reviewed.   ED Course  Procedures (including critical care time)  Labs Review Labs Reviewed  CBC WITH DIFFERENTIAL/PLATELET - Abnormal; Notable for the following:    WBC 12.9 (*)    RBC 4.14 (*)    Hemoglobin 12.0 (*)    HCT 37.7 (*)    RDW 19.9 (*)    Neutrophils Relative % 78 (*)    Neutro Abs 10.1 (*)    Lymphocytes Relative 3 (*)    Lymphs Abs 0.4 (*)    Monocytes Absolute 1.4 (*)    Eosinophils Relative 7 (*)    Eosinophils Absolute 0.9 (*)    All other components within normal limits  BASIC METABOLIC PANEL - Abnormal; Notable for the following:    Chloride 96 (*)    BUN 28 (*)    Creatinine, Ser 6.85 (*)    Calcium 8.5 (*)    GFR calc non Af Amer 7 (*)    GFR calc Af Amer 8 (*)    All other components within normal limits  CK TOTAL AND CKMB (NOT AT [facility]) -  Abnormal; Notable for the following:    CK, MB 6.8 (*)    Relative Index 5.9 (*)    All other components within normal limits  I-STAT TROPOININ, ED - Abnormal; Notable for the following:    Troponin i, poc 0.40 (*)    All other components within normal limits  CBG MONITORING, ED    Imaging Review Dg Chest 2 View  04/28/2015   CLINICAL DATA:  Tachypnea today. Short of breath. Initial encounter.  EXAM: CHEST  2 VIEW  COMPARISON:  04/22/2015.  FINDINGS: The cardiopericardial silhouette is enlarged. There is interstitial pulmonary edema. Basilar atelectasis. Monitoring leads project over the chest. No airspace consolidation. Probable small bilateral pleural effusions with blunting of both costophrenic angles. This appears to be a chronic finding. IVC filter is incidentally noted.  IMPRESSION: Mild CHF with small bilateral pleural effusions and interstitial pulmonary edema.   Electronically Signed   By: Dereck Ligas M.D.   On: 04/28/2015 15:29     I, Marella Chimes, personally reviewed and evaluated these images and lab results as part of my medical decision-making.   EKG Interpretation   Date/Time:  Monday April 28 2015 13:33:12 EDT Ventricular Rate:  90 PR Interval:  169 QRS Duration: 92 QT Interval:  391 QTC Calculation: 478 R Axis:   -67 Text Interpretation:  Sinus rhythm Left anterior fascicular block Abnormal  T, consider ischemia, anterior leads Baseline wander in lead(s) V3 since  last tracing no significant change Confirmed by MILLER  MD, BRIAN ([id number])  on 04/28/2015 1:39:36 PM      MDM   Final diagnoses:  Syncope, unspecified syncope type   74 year old male presents from dialysis with syncope, reported to have systolic BP in [id number]. Recently hospitalized s/p STEMI 04/2015. BP stable in the ED. Mild tachypnea on exam. No focal neuro deficits.  CBG 70, within normal limits. CBC remarkable for leukocytosis and anemia, which appears stable. BMP with elevated BUN (28)  and creatinine (6.85), consistent with the patient's ESRD requiring HD. CXR demonstrates mild CHF with small bilateral pleural effusions and interstitial pulmonary edema.  Cardiology consulted given elevated troponin (0.40, downtrending from time of admission) and mildly elevated CK-MB (6.8). Patient discussed with cardiologist, who recommended discontinuing his carvedilol, and was reassured by  down-trending troponin.  On exam, the patient is non-tender to palpation over his hips bilaterally, though is tender to palpation over the area of skin breakdown to bilateral ischium. Spoke with wound care nurse about seeing the patient in the ED, though wound care only sees patients admitted to the hospital. Recommended using barrier cream while in the ED.  Patient stable for discharge. Follow-up with PCP this week. Return precautions discussed.   BP 134/63 mmHg  Pulse 97  Temp(Src) 98.2 F (36.8 C) (Oral)  Resp 24  SpO2 100%   Marella Chimes, PA-C 04/28/15 [place], PA-C 04/28/15 [place], MD 04/30/15 [phone]

## 2015-04-28 NOTE — Progress Notes (Signed)
RUE fistula deaccessed. Pressure x10 min venous/65min arterial. Hemostasis obtained. Floor RN at bedside aware.

## 2015-04-28 NOTE — ED Notes (Signed)
Pt. Presents for evaluation following a syncopal episode at dialysis. Pt. Received approx 25 min of txt today when dialysis center states he had full syncopal episode. Pt. Was lying in bed. Pt. BP 50 systolic per dialysis center. Pt. Remains accessed by dialysis center on arrival. Pt. BP now 140/80, given 1L bolus by dialysis center. Pt. Does not recall syncopal episode, pt. States his hips were hurting.

## 2015-05-08 ENCOUNTER — Telehealth: Payer: Self-pay | Admitting: Cardiology

## 2015-05-08 NOTE — Telephone Encounter (Signed)
Spoke with Olivia Mackie at [facility]. She states that the patient's wife advised that he should not be taking Coreg and she is calling to clarify. Advised that according to the consult note from Dr.Skains dated 8/15, the Coreg was stopped due to the fact that the patient's BP dropped to 50 and he had a syncopal episode at dialysis.  Olivia Mackie was not aware that he had an ER visit on 8/15 after his other hospital admission. Advised that the note from the ER visit does not mention to stop the Lipitor. His BP now is 150/80. Has appointment here at [facility] on 9/2. Roena Malady to touch base with Ellen Henri after the 9/2 visit to determine if he needs more home health visits. She verbalized understanding of above.

## 2015-05-08 NOTE — Telephone Encounter (Signed)
New message      Pt had not been taking lipitor and coreg since discharge because wife said he was not to be taking it.  Nurse convinced patient to start lipitor and coreg starting today.  Please call and clarify medication instructions. Also, home health nurse is asking for extended visits---1-2 more visits

## 2015-05-13 ENCOUNTER — Inpatient Hospital Stay (HOSPITAL_COMMUNITY)
Admission: EM | Admit: 2015-05-13 | Discharge: 2015-05-17 | DRG: 291 | Disposition: A | Discharge status: Home / Self Care | Payer: Medicare Other | Attending: Internal Medicine | Admitting: Internal Medicine

## 2015-05-13 DIAGNOSIS — Z86718 Personal history of other venous thrombosis and embolism: Secondary | ICD-10-CM

## 2015-05-13 DIAGNOSIS — I959 Hypotension, unspecified: Secondary | ICD-10-CM | POA: Diagnosis present

## 2015-05-13 DIAGNOSIS — M349 Systemic sclerosis, unspecified: Secondary | ICD-10-CM | POA: Diagnosis present

## 2015-05-13 DIAGNOSIS — M311 Thrombotic microangiopathy: Secondary | ICD-10-CM | POA: Diagnosis present

## 2015-05-13 DIAGNOSIS — Y95 Nosocomial condition: Secondary | ICD-10-CM | POA: Diagnosis present

## 2015-05-13 DIAGNOSIS — N186 End stage renal disease: Secondary | ICD-10-CM | POA: Diagnosis present

## 2015-05-13 DIAGNOSIS — E872 Acidosis, unspecified: Secondary | ICD-10-CM

## 2015-05-13 DIAGNOSIS — Z992 Dependence on renal dialysis: Secondary | ICD-10-CM

## 2015-05-13 DIAGNOSIS — Z79899 Other long term (current) drug therapy: Secondary | ICD-10-CM

## 2015-05-13 DIAGNOSIS — Z7982 Long term (current) use of aspirin: Secondary | ICD-10-CM

## 2015-05-13 DIAGNOSIS — Z66 Do not resuscitate: Secondary | ICD-10-CM | POA: Diagnosis present

## 2015-05-13 DIAGNOSIS — J849 Interstitial pulmonary disease, unspecified: Secondary | ICD-10-CM

## 2015-05-13 DIAGNOSIS — I82409 Acute embolism and thrombosis of unspecified deep veins of unspecified lower extremity: Secondary | ICD-10-CM | POA: Diagnosis present

## 2015-05-13 DIAGNOSIS — E43 Unspecified severe protein-calorie malnutrition: Secondary | ICD-10-CM | POA: Diagnosis present

## 2015-05-13 DIAGNOSIS — I5043 Acute on chronic combined systolic (congestive) and diastolic (congestive) heart failure: Secondary | ICD-10-CM | POA: Diagnosis present

## 2015-05-13 DIAGNOSIS — I120 Hypertensive chronic kidney disease with stage 5 chronic kidney disease or end stage renal disease: Secondary | ICD-10-CM | POA: Diagnosis present

## 2015-05-13 DIAGNOSIS — J189 Pneumonia, unspecified organism: Secondary | ICD-10-CM | POA: Diagnosis present

## 2015-05-13 DIAGNOSIS — I5033 Acute on chronic diastolic (congestive) heart failure: Principal | ICD-10-CM | POA: Diagnosis present

## 2015-05-13 DIAGNOSIS — K219 Gastro-esophageal reflux disease without esophagitis: Secondary | ICD-10-CM | POA: Diagnosis present

## 2015-05-13 DIAGNOSIS — J8410 Pulmonary fibrosis, unspecified: Secondary | ICD-10-CM | POA: Diagnosis present

## 2015-05-13 DIAGNOSIS — R5381 Other malaise: Secondary | ICD-10-CM | POA: Diagnosis present

## 2015-05-13 DIAGNOSIS — J9601 Acute respiratory failure with hypoxia: Secondary | ICD-10-CM | POA: Diagnosis present

## 2015-05-13 DIAGNOSIS — J811 Chronic pulmonary edema: Secondary | ICD-10-CM | POA: Diagnosis present

## 2015-05-13 DIAGNOSIS — I10 Essential (primary) hypertension: Secondary | ICD-10-CM | POA: Diagnosis present

## 2015-05-13 DIAGNOSIS — R0603 Acute respiratory distress: Secondary | ICD-10-CM | POA: Diagnosis present

## 2015-05-13 DIAGNOSIS — J810 Acute pulmonary edema: Secondary | ICD-10-CM | POA: Diagnosis present

## 2015-05-13 DIAGNOSIS — D649 Anemia, unspecified: Secondary | ICD-10-CM | POA: Diagnosis present

## 2015-05-13 DIAGNOSIS — M898X9 Other specified disorders of bone, unspecified site: Secondary | ICD-10-CM | POA: Diagnosis present

## 2015-05-13 DIAGNOSIS — R000 Tachycardia, unspecified: Secondary | ICD-10-CM

## 2015-05-13 DIAGNOSIS — R0902 Hypoxemia: Secondary | ICD-10-CM | POA: Diagnosis present

## 2015-05-13 DIAGNOSIS — Z9981 Dependence on supplemental oxygen: Secondary | ICD-10-CM

## 2015-05-13 DIAGNOSIS — N2581 Secondary hyperparathyroidism of renal origin: Secondary | ICD-10-CM | POA: Diagnosis present

## 2015-05-13 DIAGNOSIS — M3500 Sicca syndrome, unspecified: Secondary | ICD-10-CM | POA: Diagnosis present

## 2015-05-14 ENCOUNTER — Encounter (HOSPITAL_COMMUNITY): Payer: Self-pay | Admitting: Emergency Medicine

## 2015-05-14 ENCOUNTER — Emergency Department (HOSPITAL_COMMUNITY): Payer: Medicare Other

## 2015-05-14 DIAGNOSIS — R5381 Other malaise: Secondary | ICD-10-CM

## 2015-05-14 DIAGNOSIS — M311 Thrombotic microangiopathy: Secondary | ICD-10-CM | POA: Diagnosis present

## 2015-05-14 DIAGNOSIS — N186 End stage renal disease: Secondary | ICD-10-CM | POA: Diagnosis present

## 2015-05-14 DIAGNOSIS — E43 Unspecified severe protein-calorie malnutrition: Secondary | ICD-10-CM | POA: Diagnosis present

## 2015-05-14 DIAGNOSIS — J80 Acute respiratory distress syndrome: Secondary | ICD-10-CM

## 2015-05-14 DIAGNOSIS — J189 Pneumonia, unspecified organism: Secondary | ICD-10-CM | POA: Diagnosis present

## 2015-05-14 DIAGNOSIS — Z66 Do not resuscitate: Secondary | ICD-10-CM | POA: Diagnosis present

## 2015-05-14 DIAGNOSIS — Z86718 Personal history of other venous thrombosis and embolism: Secondary | ICD-10-CM | POA: Diagnosis not present

## 2015-05-14 DIAGNOSIS — M898X9 Other specified disorders of bone, unspecified site: Secondary | ICD-10-CM | POA: Diagnosis present

## 2015-05-14 DIAGNOSIS — J810 Acute pulmonary edema: Secondary | ICD-10-CM | POA: Diagnosis not present

## 2015-05-14 DIAGNOSIS — Z79899 Other long term (current) drug therapy: Secondary | ICD-10-CM | POA: Diagnosis not present

## 2015-05-14 DIAGNOSIS — Z7982 Long term (current) use of aspirin: Secondary | ICD-10-CM | POA: Diagnosis not present

## 2015-05-14 DIAGNOSIS — I82409 Acute embolism and thrombosis of unspecified deep veins of unspecified lower extremity: Secondary | ICD-10-CM | POA: Diagnosis not present

## 2015-05-14 DIAGNOSIS — J849 Interstitial pulmonary disease, unspecified: Secondary | ICD-10-CM | POA: Diagnosis not present

## 2015-05-14 DIAGNOSIS — J9601 Acute respiratory failure with hypoxia: Secondary | ICD-10-CM | POA: Diagnosis present

## 2015-05-14 DIAGNOSIS — M349 Systemic sclerosis, unspecified: Secondary | ICD-10-CM | POA: Diagnosis present

## 2015-05-14 DIAGNOSIS — I10 Essential (primary) hypertension: Secondary | ICD-10-CM

## 2015-05-14 DIAGNOSIS — E872 Acidosis: Secondary | ICD-10-CM | POA: Diagnosis present

## 2015-05-14 DIAGNOSIS — N2581 Secondary hyperparathyroidism of renal origin: Secondary | ICD-10-CM | POA: Diagnosis present

## 2015-05-14 DIAGNOSIS — D649 Anemia, unspecified: Secondary | ICD-10-CM | POA: Diagnosis present

## 2015-05-14 DIAGNOSIS — Z992 Dependence on renal dialysis: Secondary | ICD-10-CM

## 2015-05-14 DIAGNOSIS — K219 Gastro-esophageal reflux disease without esophagitis: Secondary | ICD-10-CM | POA: Diagnosis present

## 2015-05-14 DIAGNOSIS — J8410 Pulmonary fibrosis, unspecified: Secondary | ICD-10-CM | POA: Diagnosis present

## 2015-05-14 DIAGNOSIS — R0603 Acute respiratory distress: Secondary | ICD-10-CM | POA: Diagnosis present

## 2015-05-14 DIAGNOSIS — Z9981 Dependence on supplemental oxygen: Secondary | ICD-10-CM | POA: Diagnosis not present

## 2015-05-14 DIAGNOSIS — J811 Chronic pulmonary edema: Secondary | ICD-10-CM | POA: Diagnosis present

## 2015-05-14 DIAGNOSIS — I5033 Acute on chronic diastolic (congestive) heart failure: Principal | ICD-10-CM

## 2015-05-14 DIAGNOSIS — I120 Hypertensive chronic kidney disease with stage 5 chronic kidney disease or end stage renal disease: Secondary | ICD-10-CM | POA: Diagnosis present

## 2015-05-14 DIAGNOSIS — R0902 Hypoxemia: Secondary | ICD-10-CM | POA: Diagnosis present

## 2015-05-14 DIAGNOSIS — Y95 Nosocomial condition: Secondary | ICD-10-CM | POA: Diagnosis present

## 2015-05-14 DIAGNOSIS — M3500 Sicca syndrome, unspecified: Secondary | ICD-10-CM | POA: Diagnosis present

## 2015-05-14 DIAGNOSIS — I959 Hypotension, unspecified: Secondary | ICD-10-CM | POA: Diagnosis present

## 2015-05-14 LAB — COMPREHENSIVE METABOLIC PANEL
ALBUMIN: 2.4 g/dL — AB (ref 3.5–5.0)
ALK PHOS: 101 U/L (ref 38–126)
ALT: 25 U/L (ref 17–63)
AST: 45 U/L — AB (ref 15–41)
Anion gap: 14 (ref 5–15)
BILIRUBIN TOTAL: 0.5 mg/dL (ref 0.3–1.2)
BUN: 17 mg/dL (ref 6–20)
CALCIUM: 8.8 mg/dL — AB (ref 8.9–10.3)
CO2: 26 mmol/L (ref 22–32)
CREATININE: 5.31 mg/dL — AB (ref 0.61–1.24)
Chloride: 93 mmol/L — ABNORMAL LOW (ref 101–111)
GFR calc Af Amer: 11 mL/min — ABNORMAL LOW (ref >60)
GFR, EST NON AFRICAN AMERICAN: 10 mL/min — AB (ref >60)
GLUCOSE: 136 mg/dL — AB (ref 65–99)
Potassium: 4.3 mmol/L (ref 3.5–5.1)
Sodium: 133 mmol/L — ABNORMAL LOW (ref 135–145)
TOTAL PROTEIN: 7.1 g/dL (ref 6.5–8.1)

## 2015-05-14 LAB — I-STAT CG4 LACTIC ACID, ED: Lactic Acid, Venous: 4.89 mmol/L (ref 0.5–2.0)

## 2015-05-14 LAB — CBC WITH DIFFERENTIAL/PLATELET
BASOS ABS: 0.1 10*3/uL (ref 0.0–0.1)
BASOS PCT: 1 % (ref 0–1)
Eosinophils Absolute: 0.2 10*3/uL (ref 0.0–0.7)
Eosinophils Relative: 2 % (ref 0–5)
HEMATOCRIT: 36.8 % — AB (ref 39.0–52.0)
HEMOGLOBIN: 11.4 g/dL — AB (ref 13.0–17.0)
LYMPHS PCT: 3 % — AB (ref 12–46)
Lymphs Abs: 0.5 10*3/uL — ABNORMAL LOW (ref 0.7–4.0)
MCH: 27.9 pg (ref 26.0–34.0)
MCHC: 31 g/dL (ref 30.0–36.0)
MCV: 90.2 fL (ref 78.0–100.0)
MONO ABS: 1.4 10*3/uL — AB (ref 0.1–1.0)
MONOS PCT: 10 % (ref 3–12)
NEUTROS ABS: 11.3 10*3/uL — AB (ref 1.7–7.7)
NEUTROS PCT: 84 % — AB (ref 43–77)
Platelets: 287 10*3/uL (ref 150–400)
RBC: 4.08 MIL/uL — ABNORMAL LOW (ref 4.22–5.81)
RDW: 19 % — ABNORMAL HIGH (ref 11.5–15.5)
WBC: 13.4 10*3/uL — ABNORMAL HIGH (ref 4.0–10.5)

## 2015-05-14 LAB — BRAIN NATRIURETIC PEPTIDE

## 2015-05-14 LAB — RENAL FUNCTION PANEL
ALBUMIN: 2.3 g/dL — AB (ref 3.5–5.0)
ANION GAP: 17 — AB (ref 5–15)
BUN: 20 mg/dL (ref 6–20)
CHLORIDE: 92 mmol/L — AB (ref 101–111)
CO2: 25 mmol/L (ref 22–32)
Calcium: 8.6 mg/dL — ABNORMAL LOW (ref 8.9–10.3)
Creatinine, Ser: 5.2 mg/dL — ABNORMAL HIGH (ref 0.61–1.24)
GFR, EST AFRICAN AMERICAN: 11 mL/min — AB (ref >60)
GFR, EST NON AFRICAN AMERICAN: 10 mL/min — AB (ref >60)
Glucose, Bld: 125 mg/dL — ABNORMAL HIGH (ref 65–99)
PHOSPHORUS: 7.1 mg/dL — AB (ref 2.5–4.6)
POTASSIUM: 4.5 mmol/L (ref 3.5–5.1)
Sodium: 134 mmol/L — ABNORMAL LOW (ref 135–145)

## 2015-05-14 LAB — PROTIME-INR
INR: 1.2 (ref 0.00–1.49)
Prothrombin Time: 15.3 seconds — ABNORMAL HIGH (ref 11.6–15.2)

## 2015-05-14 LAB — TROPONIN I: Troponin I: 0.32 ng/mL — ABNORMAL HIGH (ref <0.031)

## 2015-05-14 LAB — LACTIC ACID, PLASMA: LACTIC ACID, VENOUS: 5.4 mmol/L — AB (ref 0.5–2.0)

## 2015-05-14 MED ORDER — NITROGLYCERIN 2 % TD OINT
1.0000 [in_us] | TOPICAL_OINTMENT | Freq: Once | TRANSDERMAL | Status: AC
  Administered 2015-05-14: 1 [in_us] via TOPICAL
  Filled 2015-05-14: qty 1

## 2015-05-14 MED ORDER — CARVEDILOL 3.125 MG PO TABS
3.1250 mg | ORAL_TABLET | Freq: Two times a day (BID) | ORAL | Status: DC
  Administered 2015-05-14 – 2015-05-17 (×7): 3.125 mg via ORAL
  Filled 2015-05-14 (×9): qty 1

## 2015-05-14 MED ORDER — PIPERACILLIN-TAZOBACTAM IN DEX 2-0.25 GM/50ML IV SOLN
2.2500 g | Freq: Three times a day (TID) | INTRAVENOUS | Status: DC
  Administered 2015-05-14 – 2015-05-16 (×8): 2.25 g via INTRAVENOUS
  Filled 2015-05-14 (×12): qty 50

## 2015-05-14 MED ORDER — HEPARIN SODIUM (PORCINE) 1000 UNIT/ML DIALYSIS: 1000.0000 [IU] | INTRAMUSCULAR | Status: DC | PRN

## 2015-05-14 MED ORDER — SODIUM CHLORIDE 0.9 % IV BOLUS (SEPSIS)
500.0000 mL | Freq: Once | INTRAVENOUS | Status: AC
Start: 2015-05-14 — End: 2015-05-14
  Administered 2015-05-14: 500 mL via INTRAVENOUS

## 2015-05-14 MED ORDER — PENTAFLUOROPROP-TETRAFLUOROETH EX AERO: 1.0000 "application " | INHALATION_SPRAY | CUTANEOUS | Status: DC | PRN

## 2015-05-14 MED ORDER — SEVELAMER CARBONATE 800 MG PO TABS
1600.0000 mg | ORAL_TABLET | Freq: Three times a day (TID) | ORAL | Status: DC
  Administered 2015-05-15 – 2015-05-17 (×8): 1600 mg via ORAL
  Filled 2015-05-14 (×12): qty 2

## 2015-05-14 MED ORDER — ALTEPLASE 2 MG IJ SOLR
2.0000 mg | Freq: Once | INTRAMUSCULAR | Status: DC | PRN
Start: 2015-05-14 — End: 2015-05-14
  Filled 2015-05-14: qty 2

## 2015-05-14 MED ORDER — VANCOMYCIN HCL 500 MG IV SOLR
500.0000 mg | Freq: Once | INTRAVENOUS | Status: AC
  Administered 2015-05-14: 500 mg via INTRAVENOUS
  Filled 2015-05-14: qty 500

## 2015-05-14 MED ORDER — METOPROLOL TARTRATE 1 MG/ML IV SOLN
10.0000 mg | Freq: Four times a day (QID) | INTRAVENOUS | Status: DC | PRN
  Administered 2015-05-14: 10 mg via INTRAVENOUS
  Filled 2015-05-14: qty 10

## 2015-05-14 MED ORDER — PANTOPRAZOLE SODIUM 40 MG PO TBEC
40.0000 mg | DELAYED_RELEASE_TABLET | Freq: Every day | ORAL | Status: DC
  Administered 2015-05-14 – 2015-05-17 (×4): 40 mg via ORAL
  Filled 2015-05-14 (×5): qty 1

## 2015-05-14 MED ORDER — WHITE PETROLATUM GEL: 1.0000 "application " | Status: DC | PRN

## 2015-05-14 MED ORDER — VANCOMYCIN HCL IN DEXTROSE 750-5 MG/150ML-% IV SOLN
750.0000 mg | INTRAVENOUS | Status: DC
  Administered 2015-05-14 – 2015-05-16 (×2): 750 mg via INTRAVENOUS
  Filled 2015-05-14 (×5): qty 150

## 2015-05-14 MED ORDER — FERROUS SULFATE 325 (65 FE) MG PO TABS
325.0000 mg | ORAL_TABLET | Freq: Two times a day (BID) | ORAL | Status: DC
  Filled 2015-05-14 (×3): qty 1

## 2015-05-14 MED ORDER — SODIUM CHLORIDE 0.9 % IV SOLN: 100.0000 mL | INTRAVENOUS | Status: DC | PRN

## 2015-05-14 MED ORDER — LIDOCAINE-PRILOCAINE 2.5-2.5 % EX CREA
1.0000 "application " | TOPICAL_CREAM | CUTANEOUS | Status: DC | PRN
  Filled 2015-05-14: qty 5

## 2015-05-14 MED ORDER — HEPARIN SODIUM (PORCINE) 1000 UNIT/ML DIALYSIS: 20.0000 [IU]/kg | INTRAMUSCULAR | Status: DC | PRN

## 2015-05-14 MED ORDER — LIDOCAINE HCL (PF) 1 % IJ SOLN: 5.0000 mL | INTRAMUSCULAR | Status: DC | PRN

## 2015-05-14 MED ORDER — PRO-STAT SUGAR FREE PO LIQD
30.0000 mL | Freq: Two times a day (BID) | ORAL | Status: DC
  Administered 2015-05-14 – 2015-05-16 (×4): 30 mL via ORAL
  Filled 2015-05-14 (×8): qty 30

## 2015-05-14 MED ORDER — PIPERACILLIN-TAZOBACTAM 3.375 G IVPB 30 MIN
3.3750 g | Freq: Once | INTRAVENOUS | Status: AC
  Administered 2015-05-14: 3.375 g via INTRAVENOUS
  Filled 2015-05-14: qty 50

## 2015-05-14 MED ORDER — HEPARIN SODIUM (PORCINE) 5000 UNIT/ML IJ SOLN
5000.0000 [IU] | Freq: Three times a day (TID) | INTRAMUSCULAR | Status: DC
  Administered 2015-05-14 – 2015-05-17 (×7): 5000 [IU] via SUBCUTANEOUS
  Filled 2015-05-14 (×11): qty 1

## 2015-05-14 MED ORDER — HYDROXYZINE HCL 10 MG PO TABS
10.0000 mg | ORAL_TABLET | Freq: Three times a day (TID) | ORAL | Status: DC | PRN
Start: 2015-05-14 — End: 2015-05-17
  Filled 2015-05-14: qty 1

## 2015-05-14 MED ORDER — NEPRO/CARBSTEADY PO LIQD
237.0000 mL | Freq: Three times a day (TID) | ORAL | Status: DC
  Filled 2015-05-14 (×12): qty 237

## 2015-05-14 MED ORDER — GUAIFENESIN ER 600 MG PO TB12
600.0000 mg | ORAL_TABLET | Freq: Two times a day (BID) | ORAL | Status: DC
  Administered 2015-05-14 – 2015-05-17 (×7): 600 mg via ORAL
  Filled 2015-05-14 (×8): qty 1

## 2015-05-14 MED ORDER — RENA-VITE PO TABS
1.0000 | ORAL_TABLET | Freq: Every day | ORAL | Status: DC
  Administered 2015-05-14 – 2015-05-17 (×4): 1 via ORAL
  Filled 2015-05-14 (×4): qty 1

## 2015-05-14 MED ORDER — FUROSEMIDE 10 MG/ML IJ SOLN
40.0000 mg | Freq: Once | INTRAMUSCULAR | Status: AC
  Administered 2015-05-14: 40 mg via INTRAVENOUS
  Filled 2015-05-14: qty 4

## 2015-05-14 MED ORDER — NEPRO/CARBSTEADY PO LIQD
237.0000 mL | Freq: Two times a day (BID) | ORAL | Status: DC
  Administered 2015-05-14: 237 mL via ORAL
  Filled 2015-05-14 (×4): qty 237

## 2015-05-14 MED ORDER — NEPRO/CARBSTEADY PO LIQD
237.0000 mL | ORAL | Status: DC | PRN
  Filled 2015-05-14: qty 237

## 2015-05-14 MED ORDER — ATORVASTATIN CALCIUM 40 MG PO TABS
40.0000 mg | ORAL_TABLET | Freq: Every day | ORAL | Status: DC
  Administered 2015-05-14 – 2015-05-17 (×4): 40 mg via ORAL
  Filled 2015-05-14 (×4): qty 1

## 2015-05-14 MED ORDER — VANCOMYCIN HCL IN DEXTROSE 1-5 GM/200ML-% IV SOLN
1000.0000 mg | Freq: Once | INTRAVENOUS | Status: AC
  Administered 2015-05-14: 1000 mg via INTRAVENOUS
  Filled 2015-05-14: qty 200

## 2015-05-14 MED ORDER — ASPIRIN EC 81 MG PO TBEC
81.0000 mg | DELAYED_RELEASE_TABLET | Freq: Every day | ORAL | Status: DC
  Administered 2015-05-14 – 2015-05-17 (×4): 81 mg via ORAL
  Filled 2015-05-14 (×4): qty 1

## 2015-05-14 MED ORDER — HYDROXYCHLOROQUINE SULFATE 200 MG PO TABS
200.0000 mg | ORAL_TABLET | Freq: Every day | ORAL | Status: DC
  Administered 2015-05-14 – 2015-05-17 (×4): 200 mg via ORAL
  Filled 2015-05-14 (×4): qty 1

## 2015-05-14 NOTE — Progress Notes (Signed)
RT called for Resp Distress arriving by EMS. Pt wearing NRB. Sats 100%. No Distress. RT will monitor. RN aware to call.

## 2015-05-14 NOTE — Progress Notes (Signed)
Dr. Thereasa Solo paged due to patients c/o of increasing SOB, increased RR and elevated HR. No new orders received MD to se pt shortly. Will continue to monitor.

## 2015-05-14 NOTE — Progress Notes (Signed)
Initial Nutrition Assessment  DOCUMENTATION CODES:   Severe malnutrition in context of chronic illness  INTERVENTION:    Nepro Shake po TID, each supplement provides 425 kcal and 19 grams protein  NUTRITION DIAGNOSIS:   Malnutrition related to chronic illness as evidenced by percent weight loss, severe depletion of muscle mass (23% weight loss within 3 months).  GOAL:   Patient will meet greater than or equal to 90% of their needs  MONITOR:   PO intake, Supplement acceptance, Labs, Weight trends  REASON FOR ASSESSMENT:   Consult Assessment of nutrition requirement/status  ASSESSMENT:   74 y.o. 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 Other problems include history of right DVT with prior IVC filter, Sjogrens, hypertension, past admissions for PNA and notes indicate pulmonary fibrosis as well. EF 35-40% 8/2016He is on dialysis MWF at [facility] reviewed: sodium low, phosphorus elevated  Patient reports that he has been eating poorly because he does not like the food he is served at his nursing facility. He endorses 30 lbs weight loss over the past few months. Nutrition-Focused physical exam completed. Findings are mild-moderate fat depletion, severe muscle depletion, and no edema. Patient with severe PCM.  Diet Order:  Diet renal/carb modified with fluid restriction Diet-HS Snack?: Nothing; Room service appropriate?: Yes; Fluid consistency:: Thin  Skin:  Reviewed, no issues  Last BM:  8/31  Height:   Ht Readings from Last 1 Encounters:  05/14/15 5\' 10"  (1.778 m)    Weight:   Wt Readings from Last 1 Encounters:  05/14/15 154 lb 12.2 oz (70.2 kg)    Ideal Body Weight:  75.5 kg  BMI:  Body mass index is 22.21 kg/(m^2).  Estimated Nutritional Needs:   Kcal:  2100-2400  Protein:  85-100 gm  Fluid:   1.2 L  EDUCATION NEEDS:   No education needs identified at this time  Molli Barrows, [place], [place], [place] Pager [phone] After Hours Pager [phone]

## 2015-05-14 NOTE — Procedures (Signed)
I have seen and examined this patient and agree with the plan of care .  Admitted with pulmonary edema  --- emergent dialysis with removal of volume  Abreanna Drawdy W 05/14/2015, 9:30 AM

## 2015-05-14 NOTE — Consult Note (Signed)
[facility] Renal Consultation Note    Indication for Consultation:  Management of ESRD/hemodialysis; anemia, hypertension/volume and secondary hyperparathyroidism PCP: Dr. [name] Requesting physician: Dr. Berle Mull HPI: Travis [name] is a 74 y.o. 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 Other problems include history of right DVT with prior IVC filter, Sjogrens, hypertension, past admissions for PNA and notes indicate pulmonary fibrosis as well. EF 35-40% 8/2016He is on dialysis MWF at [facility]    He presented yesterday with complaints of SOB.  His last HD was 8/29 with a net UF of only 0.5 post HD BP 174/100 . Friday 8/26 he presented to dialysis BELOW his EDW at 71.7 and left at 70.9 - with a post BP of 167/93. Evaluation in the ED showed WBC 13.4 84% N BNP >4500 CXR showed ?infiltrate, possible alveolar edema and bilateral pleural effusions. Overnight, he has had increasing SOB with RR. He has been brought to the inpatient HD unit this am for emergent HD   Past Medical History  Diagnosis Date  . Medical history non-contributory   . Arthritis   . Pneumonia   . GERD (gastroesophageal reflux disease)   . Sjogren's disease 01/28/2015  . Pulmonary fibrosis   . 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 Received plasma exchange and steroids but didn't respond and ended up starting hemodialysis June 2016.   Marland Kitchen Acute on chronic diastolic CHF (congestive heart failure) 04/18/2015  . Accelerated hypertension 04/19/2015   Past Surgical History  Procedure Laterality Date  . Hemorroidectomy  1999  . Surgical procedure to remove a mole as a child Right Eye area    At around 40 years old  . Insertion of dialysis catheter N/A 02/17/2015    Procedure:  INSERTION OF DIALYSIS CATHETER RIGHT INTERNAL JUGULAR VEIN;  Surgeon: Elam Dutch, MD;  Location: [facility];  Service: Vascular;  Laterality: N/A;  . Av fistula placement Right 02/17/2015    Procedure: INSERTION OF RIGHT ARM  ARTERIOVENOUS (AV) GORE-TEX GRAFT ;  Surgeon: Elam Dutch, MD;  Location: [facility] OR;  Service: Vascular;  Laterality: Right;   Family History  Problem Relation Age of Onset  . Hypertension Mother    Social History:  reports that he has never smoked. He has never used smokeless tobacco. He reports that he does not drink alcohol or use illicit drugs. No Known Allergies Prior to Admission medications   Medication Sig Start Date End Date Taking? Authorizing Provider  hydroxychloroquine (PLAQUENIL) 200 MG tablet Take 200 mg by mouth daily.   Yes Historical Provider, MD  Amino Acids-Protein Hydrolys (FEEDING SUPPLEMENT, PRO-STAT SUGAR FREE 64,) LIQD Take 30 mLs by mouth 2 (two) times daily. 04/25/15   Nishant Dhungel, MD  amoxicillin-clavulanate (AUGMENTIN) 500-125 MG per tablet Take 1 tablet (500 mg total) by mouth daily at 8 pm. 04/23/15   Reyne Dumas, MD  aspirin EC 81 MG EC tablet Take 1 tablet (81 mg total) by mouth daily. 04/23/15   Reyne Dumas, MD  atorvastatin (LIPITOR) 40 MG tablet Take 1 tablet (40 mg total) by mouth daily at 6 PM. 04/23/15   Reyne Dumas, MD  carvedilol (COREG) 3.125 MG tablet Take 1 tablet (3.125 mg total) by mouth 2 (two) times daily with a meal. 04/25/15   Nishant Dhungel,  MD  cyanocobalamin 500 MCG tablet Take 1 tablet (500 mcg total) by mouth daily. 12/26/14   Donne Hazel, MD  diphenhydrAMINE (BENADRYL) 25 mg capsule Take 1 capsule (25 mg total) by mouth every 4 (four) hours as needed for itching. 04/25/15   Nishant Dhungel, MD  ferrous sulfate 325 (65 FE) MG tablet Take 1 tablet (325 mg total) by mouth 2 (two) times daily with a meal. 12/26/14   Donne Hazel, MD  guaiFENesin (MUCINEX) 600 MG 12 hr tablet Take 1 tablet (600 mg total) by mouth 2  (two) times daily. 04/23/15   Reyne Dumas, MD  hydrOXYzine (ATARAX/VISTARIL) 10 MG tablet Take 1 tablet (10 mg total) by mouth 3 (three) times daily as needed. 02/19/15   Kelvin Cellar, MD  lanolin OINT Apply 1 application topically as needed (itching dry skin). 04/25/15   Nishant Dhungel, MD  multivitamin (RENA-VIT) TABS tablet Take 1 tablet by mouth daily.    Historical Provider, MD  Nutritional Supplements (FEEDING SUPPLEMENT, NEPRO CARB STEADY,) LIQD Take 237 mLs by mouth 2 (two) times daily between meals. 04/23/15   Reyne Dumas, MD  pantoprazole (PROTONIX) 40 MG tablet Take 1 tablet (40 mg total) by mouth daily. 01/31/15   Barton Dubois, MD  traMADol-acetaminophen (ULTRACET) 37.5-325 MG per tablet Take 1-2 tablets by mouth every 4 (four) hours as needed for severe pain. 04/23/15   Reyne Dumas, MD  triamcinolone cream (KENALOG) 0.1 % Apply 1 application topically 2 (two) times daily.    Historical Provider, MD  white petrolatum (VASELINE) GEL Apply 1 application topically as needed for dry skin.    Historical Provider, MD   Current Facility-Administered Medications  Medication Dose Route Frequency Provider Last Rate Last Dose  . aspirin EC tablet 81 mg  81 mg Oral Daily Lavina Hamman, MD      . atorvastatin (LIPITOR) tablet 40 mg  40 mg Oral q1800 Lavina Hamman, MD      . carvedilol (COREG) tablet 3.125 mg  3.125 mg Oral BID WC Lavina Hamman, MD      . feeding supplement (NEPRO CARB STEADY) liquid 237 mL  237 mL Oral BID BM Lavina Hamman, MD      . feeding supplement (PRO-STAT SUGAR FREE 64) liquid 30 mL  30 mL Oral BID Lavina Hamman, MD      . guaiFENesin (MUCINEX) 12 hr tablet 600 mg  600 mg Oral BID Lavina Hamman, MD      . heparin injection 5,000 Units  5,000 Units Subcutaneous 3 times per day Lavina Hamman, MD      . hydroxychloroquine (PLAQUENIL) tablet 200 mg  200 mg Oral Daily Lavina Hamman, MD      . hydrOXYzine (ATARAX/VISTARIL) tablet 10 mg  10 mg Oral TID PRN Lavina Hamman,  MD      . metoprolol (LOPRESSOR) injection 10 mg  10 mg Intravenous Q6H PRN Cherene Altes, MD      . multivitamin (RENA-VIT) tablet 1 tablet  1 tablet Oral QHS Lavina Hamman, MD      . pantoprazole (PROTONIX) EC tablet 40 mg  40 mg Oral Daily Lavina Hamman, MD      . piperacillin-tazobactam (ZOSYN) IVPB 2.25 g  2.25 g Intravenous Q8H Veronda P Bryk, RPH      . vancomycin (VANCOCIN) IVPB 750 mg/150 ml premix  750 mg Intravenous Q M,W,F-HD Veronda P Bryk, RPH      . white  petrolatum (VASELINE) gel 1 application  1 application Topical PRN Lavina Hamman, MD       Labs: Basic Metabolic Panel:  Recent Labs Lab 05/14/15 0130 05/14/15 0640  NA 133* 134*  K 4.3 4.5  CL 93* 92*  CO2 26 25  GLUCOSE 136* 125*  BUN 17 20  CREATININE 5.31* 5.20*  CALCIUM 8.8* 8.6*  PHOS  --  7.1*   Liver Function Tests:  Recent Labs Lab 05/14/15 0130 05/14/15 0640  AST 45*  --   ALT 25  --   ALKPHOS 101  --   BILITOT 0.5  --   PROT 7.1  --   ALBUMIN 2.4* 2.3*   CBC:  Recent Labs Lab 05/14/15 0130  WBC 13.4*  NEUTROABS 11.3*  HGB 11.4*  HCT 36.8*  MCV 90.2  PLT 287   Cardiac Enzymes:  Recent Labs Lab 05/14/15 0130  TROPONINI 0.32*   Studies/Results: Dg Chest Port 1 View  05/14/2015   CLINICAL DATA:  Worsening dyspnea  EXAM: PORTABLE CHEST - 1 VIEW  COMPARISON:  04/28/2015  FINDINGS: There are extensive alveolar opacities throughout both lungs, with more confluent consolidation in the bases. There are pleural effusions bilaterally. This may represent infectious infiltrate. A combination of infectious infiltrate and alveolar edema would also be a consideration.  IMPRESSION: Alveolar consolidation with air bronchograms in both bases, as well as more diffuse ground-glass opacities throughout both lungs. Infectious infiltrates and/or alveolar edema are the leading considerations. Pleural effusions are present bilaterally   Electronically Signed   By: Andreas Newport M.D.   On:  05/14/2015 01:31    ROS: As per HPI; limited due to SOB  Physical Exam: Filed Vitals:   05/14/15 0436 05/14/15 0500 05/14/15 0749 05/14/15 0750  BP: 160/110   149/101  Pulse: 126  125 125  Temp: 96.3 F (35.7 C) 97.6 F (36.4 C)    TempSrc: Axillary Axillary    Resp: 30  27 24   Height:      Weight:      SpO2: 100%  96% 100%     General:  SOB Head: wearing BIPAP Neck: Supple. JVD difficult to access Lungs:  Dim bases bilateral crackles using accessory muscles Heart: tachy reg Abdomen: soft NTND + BS Lower extremities: 1+  edema or ischemic changes, no open wounds  Neuro: Alert and oriented X 3. Moves all extremities spontaneously. Skin: thickened, dry Dialysis Access: right lower AVGG + bruit (reversed)  Dialysis Orders:  MWF [facility] 3 K 2 Ca EDW 72 400/800 right lower AVGG heparin 8000 (needs to be lowered at d/c) Venofer 50 - q Wed no vit D or ESA - last ESA given Aranesp 160 8/3 Hgb drifting down to 10.8 8/24 ferritin 2987 down from 3206 in July 23% sat  iPTH 240 04/2015 - not on vit D (access is reversed per pt)  Assessment/Plan: 1. acute repiratory distress - likely multifactorial due to ^ volume, ? Infiltrate superimposed on pt with pulmonary fibrosis; repeat CXR post HD ; on empiric Vanc an Zosyn; start with high goal and titrate as tolerated 2. ESRD -  MWF - has been leaving BELOW edw or having small UF goals with increasing pre HD BPs - obviously needs EDW lowered - continue 3 K bath 3. Hypertension/volume/CHF  - decrease  Volume should help BP too -  4. Anemia  - Hgb 11.4 - hold on ESA or Fe for now - stop oral Fe 5. Metabolic bone disease -  Not  on calcitriol yet, P high at 7.9 8/17 - usually in the 4s P 7.1 - start binders 6. Severe PCM  - has been declining, losing weight - renal diet + supplements Alb 2.3  - nutrition consult - ok to liberalize 7. Hx DVT - IVC filter  Myriam Jacobson, PA-C [place] [phone] 05/14/2015, 8:49 AM

## 2015-05-14 NOTE — Progress Notes (Signed)
PT Cancellation Note  Patient Details Name: Travis [name] MRN: [id number] DOB: 1941-08-03   Cancelled Treatment:    Reason Eval/Treat Not Completed: Patient at procedure or test/unavailable.  Pt in HD at time of arrival.  Expect will not make it back to see him today.  Will see as able 9/1. 05/14/2015  Donnella Sham, [place] [phone]  (pager)   Vika Buske, Tessie Fass 05/14/2015, 12:24 PM

## 2015-05-14 NOTE — Progress Notes (Signed)
RT transported pt to HD without complications. Vital signs stable throughout. Patient tolerated well. RT will continue to monitor.

## 2015-05-14 NOTE — Progress Notes (Signed)
[facility]  Patient Status: Active (receiving services up to time of hospitalization)  [facility] is providing the following services: RN, PT, OT and MSW  If patient discharges after hours, please call [phone].   Travis [name] 05/14/2015, 10:08 AM

## 2015-05-14 NOTE — ED Notes (Signed)
Per [facility]  Pt c/o SOB.and hypertensive.  Lungs clear. Pt anxious with EMS. 198/147 87-88% 2L with 60 feet of tubing at home. 95-96% on Non rebreather. 20-30 Respirations. 130 HR. 151 CBG.  20 LAC.   Dialysis pt m,w,f Pt tried to wait it out in hopes of getting better.  NO BPs RA.

## 2015-05-14 NOTE — Progress Notes (Signed)
UR COMPLETED  

## 2015-05-14 NOTE — Progress Notes (Signed)
Pt transported to [id number] without complication. Report given to Lauren, RT

## 2015-05-14 NOTE — Progress Notes (Signed)
RN called and stated MD wanted patient started on BIPAP. Pt started on IPAP: 12; EPAP: 5, RR: 10, FiO2: 50%. RT will continue to monitor.

## 2015-05-14 NOTE — Progress Notes (Signed)
[place] TEAM 1 - Stepdown/ICU TEAM PROGRESS NOTE  Travis [name] [id number] DOB: 08-02-1941 DOA: 05/13/2015 PCP: [name], MD  Admit HPI / Brief Narrative: 74 y.o. male with history of ESRD on hemodialysis, chronic diastolic heart failure, GERD, and dialysis associated hypotension who presented with complaints of the sudden onset of shortness of breath.  Did not have any complaints of chest pain nausea vomiting or choking episodes. No fever no chills. No diarrhea no constipation.  HPI/Subjective: Pt seen for f/u visit.  Assessment/Plan:  Acute respiratory distress Pulm edema v/s infectious v/s other - current weight 71.2kg which appears close to his dry weight   Pulmonary fibrosis  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   HD associated Hypotension   Sjogren's disease - Scleroderma   Acute on chronic diastolic CHF TTE Aug [id number] w/ EF 35-40%  History of DVT  not on anticoagulation due to prior history of renal hematoma - s/p IVC filter 02/2015   Protein-calorie malnutrition, severe  Code Status: DNR Family Communication: spoke w/ pt, spouse, and son at bedside  Disposition Plan: SDU  Consultants: Nephrology   Procedures: none  Antibiotics: Zosyn 8/30 > Vanc 8/30 >  DVT prophylaxis: SQ heparin   Objective: Blood pressure 160/110, pulse 126, temperature 97.6 F (36.4 C), temperature source Axillary, resp. rate 30, height 5\' 10"  (1.778 m), weight 71.215 kg (157 lb), SpO2 100 %. No intake or output data in the 24 hours ending 05/14/15 0747  Exam: Pt seen for f/u visit.  Data Reviewed: Basic Metabolic Panel:  Recent Labs Lab 05/14/15 0130 05/14/15 0640  NA 133* 134*  K 4.3 4.5  CL 93* 92*  CO2 26 25  GLUCOSE 136* 125*  BUN 17 20  CREATININE 5.31* 5.20*  CALCIUM 8.8* 8.6*  PHOS  --  7.1*    CBC:  Recent Labs Lab 05/14/15 0130  WBC 13.4*  NEUTROABS 11.3*  HGB 11.4*  HCT 36.8*  MCV 90.2  PLT 287    Liver  Function Tests:  Recent Labs Lab 05/14/15 0130 05/14/15 0640  AST 45*  --   ALT 25  --   ALKPHOS 101  --   BILITOT 0.5  --   PROT 7.1  --   ALBUMIN 2.4* 2.3*    Coags:  Recent Labs Lab 05/14/15 0130  INR 1.20   Cardiac Enzymes:  Recent Labs Lab 05/14/15 0130  TROPONINI 0.32*     Studies:   Recent x-ray studies have been reviewed in detail by the Attending Physician  Scheduled Meds:  Scheduled Meds: . aspirin EC  81 mg Oral Daily  . atorvastatin  40 mg Oral q1800  . carvedilol  3.125 mg Oral BID WC  . feeding supplement (NEPRO CARB STEADY)  237 mL Oral BID BM  . feeding supplement (PRO-STAT SUGAR FREE 64)  30 mL Oral BID  . ferrous sulfate  325 mg Oral BID WC  . guaiFENesin  600 mg Oral BID  . heparin  5,000 Units Subcutaneous 3 times per day  . hydroxychloroquine  200 mg Oral Daily  . multivitamin  1 tablet Oral QHS  . pantoprazole  40 mg Oral Daily  . piperacillin-tazobactam (ZOSYN)  IV  2.25 g Intravenous Q8H  . vancomycin  750 mg Intravenous Q M,W,F-HD    Time spent on care of this patient: No charge   Cherene Altes , MD   [facility] Office  [phone] Pager - Text Page per Shea Evans as per below:  On-Call/Text Page:  [email or web address]      password [email or web address]  If 7PM-7AM, please contact night-coverage [email or web address] Password [email or web address] 05/14/2015, 7:47 AM   LOS: 0 days

## 2015-05-14 NOTE — Progress Notes (Signed)
ANTIBIOTIC CONSULT NOTE - INITIAL  Pharmacy Consult for Vancocin and Zosyn Indication: rule out pneumonia  No Known Allergies  Patient Measurements: Height: 5\' 10"  (177.8 cm) Weight: 157 lb (71.215 kg) IBW/kg (Calculated) : 73  Vital Signs: Temp: 97.6 F (36.4 C) (08/31 0022) Temp Source: Oral (08/31 0022) BP: 168/112 mmHg (08/31 0345) Pulse Rate: 130 (08/31 0345)  Labs:  Recent Labs  05/14/15 0130  WBC 13.4*  HGB 11.4*  PLT 287  CREATININE 5.31*   Estimated Creatinine Clearance: 12.5 mL/min (by C-G formula based on Cr of 5.31).   Microbiology: Recent Results (from the past 720 hour(s))  MRSA PCR Screening     Status: None   Collection Time: 04/18/15 10:19 PM  Result Value Ref Range Status   MRSA by PCR NEGATIVE NEGATIVE Final    Comment:        The GeneXpert MRSA Assay (FDA approved for NASAL specimens only), is one component of a comprehensive MRSA colonization surveillance program. It is not intended to diagnose MRSA infection nor to guide or monitor treatment for MRSA infections.   Culture, blood (x 2)     Status: None   Collection Time: 04/19/15  1:12 AM  Result Value Ref Range Status   Specimen Description BLOOD LEFT HAND  Final   Special Requests IN PEDIATRIC BOTTLE 2CC  Final   Culture NO GROWTH 5 DAYS  Final   Report Status 04/24/2015 FINAL  Final  Culture, blood (routine x 2)     Status: None   Collection Time: 04/19/15  8:50 AM  Result Value Ref Range Status   Specimen Description BLOOD HEMODIALYSIS CATHETER  Final   Special Requests BOTTLES DRAWN AEROBIC AND ANAEROBIC 10CC  Final   Culture NO GROWTH 5 DAYS  Final   Report Status 04/24/2015 FINAL  Final  Culture, blood (routine x 2)     Status: None   Collection Time: 04/19/15  9:05 AM  Result Value Ref Range Status   Specimen Description BLOOD HEMODIALYSIS CATHETER  Final   Special Requests BOTTLES DRAWN AEROBIC AND ANAEROBIC 10CC  Final   Culture NO GROWTH 5 DAYS  Final   Report Status  04/24/2015 FINAL  Final  C difficile quick scan w PCR reflex     Status: None   Collection Time: 04/20/15  8:40 AM  Result Value Ref Range Status   C Diff antigen NEGATIVE NEGATIVE Final   C Diff toxin NEGATIVE NEGATIVE Final   C Diff interpretation Negative for toxigenic C. difficile  Final    Medical History: Past Medical History  Diagnosis Date  . Medical history non-contributory   . Arthritis   . Pneumonia   . GERD (gastroesophageal reflux disease)   . Sjogren's disease 01/28/2015  . Pulmonary fibrosis   . 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 Received plasma exchange and steroids but didn't respond and ended up starting hemodialysis June 2016.   Marland Kitchen Acute on chronic diastolic CHF (congestive heart failure) 04/18/2015  . Accelerated hypertension 04/19/2015     Assessment: 74yo male presents w/ respiratory distress, CXR shows infectious infiltrates vs alveolar edema, to begin IV ABX.  Goal of Therapy:  Pre-HD vanc level 15-25  Plan:  Rec'd vanc 1g and Zosyn 3.375g IV in ED; will give additional vancomycin 500mg  to complete load followed by 750mg  IV after each HD as well as Zosyn 2.25g IV Q8H and monitor CBC, Cx,  levels prn.  Wynona Neat, PharmD, BCPS  05/14/2015,4:32 AM

## 2015-05-14 NOTE — H&P (Signed)
[facility] History and Physical  Patient: Travis [name]  MRN: [id number]  DOB: 30-Mar-1941  DOS: the patient was seen and examined on 05/14/2015 PCP: [name], MD  Referring physician: Dr. Lita Mains Chief Complaint: Shortness of breath  HPI: Travis [name] is a 74 y.o. male with Past medical history of GERD, ESRD on hemodialysis, chronic diastolic heart failure, hypertension, and dialysis associated hypotension. The patient is presenting with complaints of shortness of breath. As per the family the patient was at his baseline and suddenly started having complaints of shortness of breath today. Did not have any complaints of chest pain nausea vomiting or choking episodes. No fever no chills. No diarrhea no constipation. The patient was recently in the ER for a syncopal episode at the time of dialysis and his Coreg was discontinued. No other recent change in his medications. Patient mentions is compliant with all his medications.  The patient is coming from home.  At his baseline ambulates support   And is dependent for most of his ADL does not manages his medication on his own.  Review of Systems: as mentioned in the history of present illness.  A comprehensive review of the other systems is negative.  Past Medical History  Diagnosis Date  . Medical history non-contributory   . Arthritis   . Pneumonia   . GERD (gastroesophageal reflux disease)   . Sjogren's disease 01/28/2015  . Pulmonary fibrosis   . 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 Received plasma exchange and steroids but didn't respond and ended up starting hemodialysis June 2016.   Marland Kitchen Acute on chronic diastolic CHF (congestive heart failure) 04/18/2015  . Accelerated hypertension 04/19/2015   Past Surgical History  Procedure Laterality Date  . Hemorroidectomy  1999  . Surgical procedure to remove a mole as a child Right Eye area   At around 26 years old  . Insertion of dialysis catheter N/A 02/17/2015    Procedure: INSERTION OF DIALYSIS CATHETER RIGHT INTERNAL JUGULAR VEIN;  Surgeon: Elam Dutch, MD;  Location: [facility];  Service: Vascular;  Laterality: N/A;  . Av fistula placement Right 02/17/2015    Procedure: INSERTION OF RIGHT ARM  ARTERIOVENOUS (AV) GORE-TEX GRAFT ;  Surgeon: Elam Dutch, MD;  Location: [facility];  Service: Vascular;  Laterality: Right;   Social History:  reports that he has never smoked. He has never used smokeless tobacco. He reports that he does not drink alcohol or use illicit drugs.  No Known Allergies  Family History  Problem Relation Age of Onset  . Hypertension Mother     Prior to Admission medications   Medication Sig Start Date End Date Taking? Authorizing Provider  hydroxychloroquine (PLAQUENIL) 200 MG tablet Take 200 mg by mouth daily.   Yes Historical Provider, MD  Amino Acids-Protein Hydrolys (FEEDING SUPPLEMENT, PRO-STAT SUGAR FREE 64,) LIQD Take 30 mLs by mouth 2 (two) times daily. 04/25/15   Nishant Dhungel, MD  amoxicillin-clavulanate (AUGMENTIN) 500-125 MG per tablet Take 1 tablet (500 mg total) by mouth daily at 8 pm. 04/23/15   Reyne Dumas, MD  aspirin EC 81 MG EC tablet Take 1 tablet (81 mg total) by mouth daily. 04/23/15   Reyne Dumas, MD  atorvastatin (LIPITOR) 40 MG tablet Take 1 tablet (40 mg total) by mouth daily at 6 PM. 04/23/15   Reyne Dumas, MD  carvedilol (COREG) 3.125 MG tablet Take 1 tablet (3.125 mg total) by  mouth 2 (two) times daily with a meal. 04/25/15   Nishant Dhungel, MD  cyanocobalamin 500 MCG tablet Take 1 tablet (500 mcg total) by mouth daily. 12/26/14   Donne Hazel, MD  diphenhydrAMINE (BENADRYL) 25 mg capsule Take 1 capsule (25 mg total) by mouth every 4 (four) hours as needed for itching. 04/25/15   Nishant Dhungel, MD  ferrous sulfate 325 (65 FE) MG tablet Take 1 tablet (325 mg total) by mouth 2 (two) times daily with a meal. 12/26/14   Donne Hazel, MD  guaiFENesin (MUCINEX) 600 MG 12 hr tablet Take 1 tablet (600 mg total) by mouth 2 (two) times daily. 04/23/15   Reyne Dumas, MD  hydrOXYzine (ATARAX/VISTARIL) 10 MG tablet Take 1 tablet (10 mg total) by mouth 3 (three) times daily as needed. 02/19/15   Kelvin Cellar, MD  lanolin OINT Apply 1 application topically as needed (itching dry skin). 04/25/15   Nishant Dhungel, MD  multivitamin (RENA-VIT) TABS tablet Take 1 tablet by mouth daily.    Historical Provider, MD  Nutritional Supplements (FEEDING SUPPLEMENT, NEPRO CARB STEADY,) LIQD Take 237 mLs by mouth 2 (two) times daily between meals. 04/23/15   Reyne Dumas, MD  pantoprazole (PROTONIX) 40 MG tablet Take 1 tablet (40 mg total) by mouth daily. 01/31/15   Barton Dubois, MD  traMADol-acetaminophen (ULTRACET) 37.5-325 MG per tablet Take 1-2 tablets by mouth every 4 (four) hours as needed for severe pain. 04/23/15   Reyne Dumas, MD  triamcinolone cream (KENALOG) 0.1 % Apply 1 application topically 2 (two) times daily.    Historical Provider, MD  white petrolatum (VASELINE) GEL Apply 1 application topically as needed for dry skin.    Historical Provider, MD    Physical Exam: Filed Vitals:   05/14/15 0315 05/14/15 0330 05/14/15 0345 05/14/15 0436  BP: 166/112 169/114 168/112 160/110  Pulse: 137 137 130 126  Temp:    96.3 F (35.7 C)  TempSrc:    Axillary  Resp: 29 32 22 30  Height:      Weight:      SpO2: 88% 90% 100% 100%    General: Alert, Awake and Oriented to Time, Place and Person. Appear in mild distress Eyes: PERRL ENT: Oral Mucosa clear moist. Neck: no JVD Cardiovascular: S1 and S2 Present, no Murmur, Peripheral Pulses Present Respiratory: Bilateral Air entry equal and Decreased,  Bilateral Crackles, no wheezes Abdomen: Bowel Sound present, Soft and no tenderness Skin: no Rash Extremities: no Pedal edema, no calf tenderness Neurologic: Grossly no focal neuro deficit.  Labs on Admission:  CBC:  Recent Labs Lab  05/14/15 0130  WBC 13.4*  NEUTROABS 11.3*  HGB 11.4*  HCT 36.8*  MCV 90.2  PLT 287    CMP     Component Value Date/Time   NA 133* 05/14/2015 0130   K 4.3 05/14/2015 0130   CL 93* 05/14/2015 0130   CO2 26 05/14/2015 0130   GLUCOSE 136* 05/14/2015 0130   BUN 17 05/14/2015 0130   CREATININE 5.31* 05/14/2015 0130   CALCIUM 8.8* 05/14/2015 0130   PROT 7.1 05/14/2015 0130   ALBUMIN 2.4* 05/14/2015 0130   AST 45* 05/14/2015 0130   ALT 25 05/14/2015 0130   ALKPHOS 101 05/14/2015 0130   BILITOT 0.5 05/14/2015 0130   GFRNONAA 10* 05/14/2015 0130   GFRAA 11* 05/14/2015 0130    No results for input(s): LIPASE, AMYLASE in the last 168 hours.   Recent Labs Lab 05/14/15 0130  TROPONINI 0.32*  BNP (last 3 results)  Recent Labs  01/06/15 1758 04/18/15 1626 05/14/15 0130  BNP 83.3 >4500.0* >4500.0*    ProBNP (last 3 results) No results for input(s): PROBNP in the last 8760 hours.   Radiological Exams on Admission: Dg Chest Port 1 View  05/14/2015   CLINICAL DATA:  Worsening dyspnea  EXAM: PORTABLE CHEST - 1 VIEW  COMPARISON:  04/28/2015  FINDINGS: There are extensive alveolar opacities throughout both lungs, with more confluent consolidation in the bases. There are pleural effusions bilaterally. This may represent infectious infiltrate. A combination of infectious infiltrate and alveolar edema would also be a consideration.  IMPRESSION: Alveolar consolidation with air bronchograms in both bases, as well as more diffuse ground-glass opacities throughout both lungs. Infectious infiltrates and/or alveolar edema are the leading considerations. Pleural effusions are present bilaterally   Electronically Signed   By: Andreas Newport M.D.   On: 05/14/2015 01:31   EKG: Independently reviewed. sinus tachycardia.  Assessment/Plan Principal Problem:   Acute respiratory distress Active Problems:   Physical deconditioning   Sjogren's disease   DVT (deep venous thrombosis)   ESRD  on dialysis   Acute on chronic diastolic CHF (congestive heart failure)   Accelerated hypertension   Protein-calorie malnutrition, severe   1. Acute respiratory distress The patient is presenting with complaints of shortness of breath. Chest x-ray shows vascular congestion. The patient is more hypoxic. Patient was recently hospitalized for similar presentation earlier this month. Patient improved significantly with hemodialysis. Currently patient will be admitted in stepdown unit, continue with hemodialysis. Continue with broad-spectrum antibiotics vancomycin and Zosyn. Monitor cultures. Possibility of healthcare associated pneumonia cannot be ruled out.  2. history of DVT not on any anticoagulation due to prior history of renal hematoma. Continue monitor.  3. ESRD on hemodialysis. Nephrology called for hemodialysis.  4. acute on chronic diastolic CHF. Most likely secondary to accelerated hypertension. Continue home medications. Restarting Coreg. Monitor on telemetry.  Advance goals of care discussion:  As per my discussion with patient's wife and patient DNR/DNI.  Consults:  nephrology   DVT Prophylaxis: subcutaneous Heparin Nutrition: . Diet renal  Family Communication: family was present at bedside, opportunity was given to ask question and all questions were answered satisfactorily at the time of interview. Disposition: Admitted as inpatient step-down unit.  Author: Berle Mull, MD [facility] Pager: [phone] 05/14/2015  If 7PM-7AM, please contact night-coverage [email or web address] Password [email or web address]

## 2015-05-14 NOTE — ED Provider Notes (Signed)
CSN: [id number]     Arrival date & time 05/13/15  2355 History  This chart was scribed for Julianne Rice, MD by Eustaquio Maize, ED Scribe. This patient was seen in room D35C/D35C and the patient's care was started at 12:08 AM.  Chief Complaint  Patient presents with  . Shortness of Breath  . Hypertension   The history is provided by the patient. No language interpreter was used.     HPI Comments: Travis [name] is a 74 y.o. male brought in by ambulance, who is on 2L O2 at home presents to the Emergency Department complaining of sudden onset, shortness of breath that began earlier today. Pt cannot say if his shortness of breath is exacerbated with laying down. Denies chest pain, abdominal pain, cough, or any other associated symptoms. Per EMS report, pt was on 87% on 2L when they arrived.   Past Medical History  Diagnosis Date  . Medical history non-contributory   . Arthritis   . Pneumonia   . GERD (gastroesophageal reflux disease)   . Sjogren's disease 01/28/2015  . Pulmonary fibrosis   . 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 Received plasma exchange and steroids but didn't respond and ended up starting hemodialysis June 2016.   Marland Kitchen Acute on chronic diastolic CHF (congestive heart failure) 04/18/2015  . Accelerated hypertension 04/19/2015   Past Surgical History  Procedure Laterality Date  . Hemorroidectomy  1999  . Surgical procedure to remove a mole as a child Right Eye area    At around 14 years old  . Insertion of dialysis catheter N/A 02/17/2015    Procedure: INSERTION OF DIALYSIS CATHETER RIGHT INTERNAL JUGULAR VEIN;  Surgeon: Elam Dutch, MD;  Location: [facility];  Service: Vascular;  Laterality: N/A;  . Av fistula placement Right 02/17/2015    Procedure: INSERTION OF RIGHT ARM  ARTERIOVENOUS (AV) GORE-TEX GRAFT ;  Surgeon: Elam Dutch, MD;  Location: [facility] OR;  Service: Vascular;  Laterality: Right;    Family History  Problem Relation Age of Onset  . Hypertension Mother    Social History  Substance Use Topics  . Smoking status: Never Smoker   . Smokeless tobacco: Never Used  . Alcohol Use: No    Review of Systems  Constitutional: Negative for fever and chills.  Respiratory: Positive for shortness of breath. Negative for cough.   Cardiovascular: Negative for chest pain, palpitations and leg swelling.  Gastrointestinal: Negative for nausea, vomiting, abdominal pain and diarrhea.  Musculoskeletal: Negative for back pain, neck pain and neck stiffness.  Skin: Negative for rash and wound.  Neurological: Negative for dizziness, weakness, light-headedness and headaches.  All other systems reviewed and are negative.  Allergies  Review of patient's allergies indicates no known allergies.  Home Medications   Prior to Admission medications   Medication Sig Start Date End Date Taking? Authorizing Provider  Amino Acids-Protein Hydrolys (FEEDING SUPPLEMENT, PRO-STAT SUGAR FREE 64,) LIQD Take 30 mLs by mouth 2 (two) times daily. 04/25/15   Nishant Dhungel, MD  amoxicillin-clavulanate (AUGMENTIN) 500-125 MG per tablet Take 1 tablet (500 mg total) by mouth daily at 8 pm. 04/23/15   Reyne Dumas, MD  aspirin EC 81 MG EC tablet Take 1 tablet (81 mg total) by mouth daily. 04/23/15   Reyne Dumas, MD  atorvastatin (LIPITOR) 40 MG tablet Take 1 tablet (40 mg total) by mouth daily at 6 PM. 04/23/15   Ascencion Dike  Abrol, MD  carvedilol (COREG) 3.125 MG tablet Take 1 tablet (3.125 mg total) by mouth 2 (two) times daily with a meal. 04/25/15   Nishant Dhungel, MD  cyanocobalamin 500 MCG tablet Take 1 tablet (500 mcg total) by mouth daily. 12/26/14   Donne Hazel, MD  diphenhydrAMINE (BENADRYL) 25 mg capsule Take 1 capsule (25 mg total) by mouth every 4 (four) hours as needed for itching. 04/25/15   Nishant Dhungel, MD  ferrous sulfate 325 (65 FE) MG tablet Take 1 tablet (325 mg total) by mouth 2 (two) times daily  with a meal. 12/26/14   Donne Hazel, MD  guaiFENesin (MUCINEX) 600 MG 12 hr tablet Take 1 tablet (600 mg total) by mouth 2 (two) times daily. 04/23/15   Reyne Dumas, MD  hydrOXYzine (ATARAX/VISTARIL) 10 MG tablet Take 1 tablet (10 mg total) by mouth 3 (three) times daily as needed. 02/19/15   Kelvin Cellar, MD  lanolin OINT Apply 1 application topically as needed (itching dry skin). 04/25/15   Nishant Dhungel, MD  multivitamin (RENA-VIT) TABS tablet Take 1 tablet by mouth daily.    Historical Provider, MD  Nutritional Supplements (FEEDING SUPPLEMENT, NEPRO CARB STEADY,) LIQD Take 237 mLs by mouth 2 (two) times daily between meals. 04/23/15   Reyne Dumas, MD  pantoprazole (PROTONIX) 40 MG tablet Take 1 tablet (40 mg total) by mouth daily. 01/31/15   Barton Dubois, MD  traMADol-acetaminophen (ULTRACET) 37.5-325 MG per tablet Take 1-2 tablets by mouth every 4 (four) hours as needed for severe pain. 04/23/15   Reyne Dumas, MD  triamcinolone cream (KENALOG) 0.1 % Apply 1 application topically 2 (two) times daily.    Historical Provider, MD  white petrolatum (VASELINE) GEL Apply 1 application topically as needed for dry skin.    Historical Provider, MD   Triage Vitals: BP 174/115 mmHg  Pulse 127  Temp(Src) 97.6 F (36.4 C) (Oral)  Resp 31  Ht 5\' 10"  (1.778 m)  Wt 157 lb (71.215 kg)  BMI 22.53 kg/m2  SpO2 99%   Physical Exam  Constitutional: He is oriented to person, place, and time. He appears well-developed and well-nourished. No distress.  HENT:  Head: Normocephalic and atraumatic.  Mouth/Throat: Oropharynx is clear and moist.  Eyes: EOM are normal. Pupils are equal, round, and reactive to light.  Neck: Normal range of motion. Neck supple.  Cardiovascular: Regular rhythm.   Tachycardia  Pulmonary/Chest: Effort normal. No respiratory distress. He has no wheezes. He has rales.  Course breath sounds bilateral bases  Abdominal: Soft. Bowel sounds are normal. He exhibits no distension and no  mass. There is no tenderness. There is no rebound and no guarding.  Musculoskeletal: Normal range of motion. He exhibits no edema or tenderness.  1+ bilateral edema.  Neurological: He is alert and oriented to person, place, and time.  Results extremities without deficit. Sensation is fully intact.  Skin: Skin is warm and dry. No rash noted. No erythema.  Psychiatric: He has a normal mood and affect. His behavior is normal.  Nursing note and vitals reviewed.   ED Course  Procedures (including critical care time)  DIAGNOSTIC STUDIES: Oxygen Saturation is 99% on [place], normal by my interpretation.    COORDINATION OF CARE: 12:16 AM-Discussed treatment plan which includes CXR, CBC, CMP, BNP, Troponin, Protime INR, UA with pt at bedside and pt agreed to plan.   Labs Review Labs Reviewed  CBC WITH DIFFERENTIAL/PLATELET - Abnormal; Notable for the following:    WBC 13.4 (*)  RBC 4.08 (*)    Hemoglobin 11.4 (*)    HCT 36.8 (*)    RDW 19.0 (*)    Neutrophils Relative % 84 (*)    Neutro Abs 11.3 (*)    Lymphocytes Relative 3 (*)    Lymphs Abs 0.5 (*)    Monocytes Absolute 1.4 (*)    All other components within normal limits  COMPREHENSIVE METABOLIC PANEL - Abnormal; Notable for the following:    Sodium 133 (*)    Chloride 93 (*)    Glucose, Bld 136 (*)    Creatinine, Ser 5.31 (*)    Calcium 8.8 (*)    Albumin 2.4 (*)    AST 45 (*)    GFR calc non Af Amer 10 (*)    GFR calc Af Amer 11 (*)    All other components within normal limits  BRAIN NATRIURETIC PEPTIDE - Abnormal; Notable for the following:    B Natriuretic Peptide >4500.0 (*)    All other components within normal limits  TROPONIN I - Abnormal; Notable for the following:    Troponin I 0.32 (*)    All other components within normal limits  PROTIME-INR - Abnormal; Notable for the following:    Prothrombin Time 15.3 (*)    All other components within normal limits  I-STAT CG4 LACTIC ACID, ED - Abnormal; Notable for the  following:    Lactic Acid, Venous 4.89 (*)    All other components within normal limits  CULTURE, BLOOD (ROUTINE X 2)  CULTURE, BLOOD (ROUTINE X 2)    Imaging Review Dg Chest Port 1 View  05/14/2015   CLINICAL DATA:  Worsening dyspnea  EXAM: PORTABLE CHEST - 1 VIEW  COMPARISON:  04/28/2015  FINDINGS: There are extensive alveolar opacities throughout both lungs, with more confluent consolidation in the bases. There are pleural effusions bilaterally. This may represent infectious infiltrate. A combination of infectious infiltrate and alveolar edema would also be a consideration.  IMPRESSION: Alveolar consolidation with air bronchograms in both bases, as well as more diffuse ground-glass opacities throughout both lungs. Infectious infiltrates and/or alveolar edema are the leading considerations. Pleural effusions are present bilaterally   Electronically Signed   By: Andreas Newport M.D.   On: 05/14/2015 01:31   I have personally reviewed and evaluated these images and lab results as part of my medical decision-making.   EKG Interpretation   Date/Time:  Wednesday May 14 2015 00:10:55 EDT Ventricular Rate:  132 PR Interval:  147 QRS Duration: 97 QT Interval:  393 QTC Calculation: 582 R Axis:   -40 Text Interpretation:  Age not entered, assumed to be  74 years old for  purpose of ECG interpretation Sinus tachycardia LVH with secondary  repolarization abnormality Inferior infarct, old Prolonged QT interval  Confirmed by Lita Mains  MD, Debbrah Sampedro ([id number]) on 05/14/2015 3:22:11 AM     CRITICAL CARE Performed by: Lita Mains, Qunisha Bryk Total critical care time: 30 min Critical care time was exclusive of separately billable procedures and treating other patients. Critical care was necessary to treat or prevent imminent or life-threatening deterioration. Critical care was time spent personally by me on the following activities: development of treatment plan with patient and/or surrogate as well as  nursing, discussions with consultants, evaluation of patient's response to treatment, examination of patient, obtaining history from patient or surrogate, ordering and performing treatments and interventions, ordering and review of laboratory studies, ordering and review of radiographic studies, pulse oximetry and re-evaluation of patient's condition. MDM   Final  diagnoses:  Acute pulmonary edema  Tachycardia  Lactic acidosis    I personally performed the services described in this documentation, which was scribed in my presence. The recorded information has been reviewed and is accurate.  Patient recently admitted with similar presentation. Found to be in pulmonary edema from congestive heart failure. Concern for possible pneumonia though thought less likely. Started on broad-spectrum antibiotics but was discharged with Augmentin.  Patient with persistent tachycardia and hypertension. Physical exam is concerning for pulmonary edema. Afebrile. Patient with chronically elevated white blood cell. Initially antibiotics withheld though lactic acid is now elevated. Given broad-spectrum antibiotics in the emergency department. Given evidence of pulmonary edema will give IV fluids judiciously. Discuss with Dr. Posey Pronto who is familiar with the patient. Will admit to step down bed.    Julianne Rice, MD 05/14/15 [phone]

## 2015-05-14 NOTE — Progress Notes (Signed)
Pt is not on BiPAP at this time patient is resting comfortable. Pt is currently sleeping with no signs of distress or complications noted. RT will continue to monitor

## 2015-05-14 NOTE — ED Notes (Signed)
Son out to desk stating his father is having trouble breathing. Oxygen saturation is 93 % on 2 liters [place]. O2 bumped to 3 liters via [place] at this time for comfort to family and patient.

## 2015-05-15 ENCOUNTER — Inpatient Hospital Stay (HOSPITAL_COMMUNITY): Payer: Medicare Other

## 2015-05-15 DIAGNOSIS — J849 Interstitial pulmonary disease, unspecified: Secondary | ICD-10-CM

## 2015-05-15 DIAGNOSIS — J810 Acute pulmonary edema: Secondary | ICD-10-CM | POA: Diagnosis present

## 2015-05-15 DIAGNOSIS — J189 Pneumonia, unspecified organism: Secondary | ICD-10-CM

## 2015-05-15 LAB — COMPREHENSIVE METABOLIC PANEL
ALBUMIN: 2.2 g/dL — AB (ref 3.5–5.0)
ALK PHOS: 98 U/L (ref 38–126)
ALT: 22 U/L (ref 17–63)
ANION GAP: 13 (ref 5–15)
AST: 41 U/L (ref 15–41)
BILIRUBIN TOTAL: 0.9 mg/dL (ref 0.3–1.2)
BUN: 16 mg/dL (ref 6–20)
CALCIUM: 8.4 mg/dL — AB (ref 8.9–10.3)
CO2: 25 mmol/L (ref 22–32)
Chloride: 98 mmol/L — ABNORMAL LOW (ref 101–111)
Creatinine, Ser: 3.77 mg/dL — ABNORMAL HIGH (ref 0.61–1.24)
GFR calc Af Amer: 17 mL/min — ABNORMAL LOW (ref >60)
GFR, EST NON AFRICAN AMERICAN: 15 mL/min — AB (ref >60)
GLUCOSE: 75 mg/dL (ref 65–99)
POTASSIUM: 4.6 mmol/L (ref 3.5–5.1)
Sodium: 136 mmol/L (ref 135–145)
TOTAL PROTEIN: 6.5 g/dL (ref 6.5–8.1)

## 2015-05-15 LAB — CBC
HCT: 39.5 % (ref 39.0–52.0)
Hemoglobin: 12.1 g/dL — ABNORMAL LOW (ref 13.0–17.0)
MCH: 27.9 pg (ref 26.0–34.0)
MCHC: 30.6 g/dL (ref 30.0–36.0)
MCV: 91.2 fL (ref 78.0–100.0)
PLATELETS: 209 10*3/uL (ref 150–400)
RBC: 4.33 MIL/uL (ref 4.22–5.81)
RDW: 19.6 % — AB (ref 11.5–15.5)
WBC: 9.6 10*3/uL (ref 4.0–10.5)

## 2015-05-15 MED ORDER — CETYLPYRIDINIUM CHLORIDE 0.05 % MT LIQD
7.0000 mL | Freq: Two times a day (BID) | OROMUCOSAL | Status: DC
  Administered 2015-05-15 – 2015-05-17 (×3): 7 mL via OROMUCOSAL

## 2015-05-15 NOTE — Evaluation (Signed)
Physical Therapy Evaluation Patient Details Name: Travis [name] MRN: [id number] DOB: 07-15-1941 Today's Date: 05/15/2015   History of Present Illness  pt is a 74 y/o male with PNHx of ESRD diastolic HF HTN admitted with SOB.  Clinical Impression  Pt admitted with/for SOB, but profoundly deconditioned at this point.  Pt currently limited functionally due to the problems listed. ( See problems list.)   Pt will benefit from PT to maximize function and safety in order to get ready for next venue listed below.     Follow Up Recommendations CIR ( )    Equipment Recommendations       Recommendations for Other Services       Precautions / Restrictions Precautions Precautions: Fall Precaution Comments: monitor HR      Mobility  Bed Mobility Overal bed mobility: Needs Assistance Bed Mobility: Supine to Sit     Supine to sit: Mod assist     General bed mobility comments: once up used UE's  and min assist to scoot to EOB, but slowly  Transfers Overall transfer level: Needs assistance Equipment used: Rolling walker (2 wheeled) Transfers: Sit to/from [facility] Sit to Stand: Min assist;+2 physical assistance Stand pivot transfers: Mod assist;Min assist       General transfer comment: pt quickly fatigued, need processing time to [facility]  Ambulation/Gait             General Gait Details: gait trial deferred due to pt fatigue and HR climbing swiftly up past 115 bpm with little to no activity.  Stairs            Wheelchair Mobility    Modified Rankin (Stroke Patients Only)       Balance Overall balance assessment: Needs assistance Sitting-balance support: No upper extremity supported Sitting balance-Leahy Scale: Fair Sitting balance - Comments: sagging posture, but could maintain apright posture for short periods.   Standing balance support: During functional activity;Single extremity supported Standing balance-Leahy Scale: Poor Standing  balance comment: still maintains an over all flexed posture unless cued to stay upright.                             Pertinent Vitals/Pain Pain Assessment: No/denies pain    Home Living Family/patient expects to be discharged to:: Private residence Living Arrangements: Spouse/significant other Available Help at Discharge: Family;Available 24 hours/day (son lives with parents to help care for pt.) Type of Home: House Home Access: Stairs to enter Entrance Stairs-Rails: Right;Left;Can reach both Entrance Stairs-Number of Steps: 5 Home Layout: Two level;Laundry or work area in basement;Able to live on main level with bedroom/bathroom Home Equipment: Environmental consultant - 2 wheels;Bedside commode      Prior Function Level of Independence: Needs assistance   Gait / Transfers Assistance Needed: using RW modified independent; assist on steps.  Last week using w/c more often  ADL's / Homemaking Assistance Needed: wife helps pt as needed. Some days he can bathe and dress some days he needs assist.        Hand Dominance   Dominant Hand: Left    Extremity/Trunk Assessment   Upper Extremity Assessment: Defer to OT evaluation           Lower Extremity Assessment: Overall WFL for tasks assessed (bil LE/ankle edematous and not very mobile)         Communication   Communication: No difficulties  Cognition Arousal/Alertness: Awake/alert Behavior During Therapy: Flat affect Overall Cognitive Status: Impaired/Different from  baseline Area of Impairment: Attention;Following commands;Problem solving   Current Attention Level: Sustained   Following Commands: Follows one step commands with increased time     Problem Solving: Slow processing      General Comments General comments (skin integrity, edema, etc.): sats at 92% on 4L    Exercises        Assessment/Plan    PT Assessment Patient needs continued PT services  PT Diagnosis     PT Problem List Decreased activity  tolerance;Decreased balance;Decreased mobility;Decreased knowledge of use of DME;Cardiopulmonary status limiting activity  PT Treatment Interventions DME instruction;Gait training;Functional mobility training;Therapeutic activities;Therapeutic exercise;Balance training;Patient/family education   PT Goals (Current goals can be found in the Care Plan section) Acute Rehab PT Goals Patient Stated Goal: none stated PT Goal Formulation: With patient/family Time For Goal Achievement: 05/29/15 Potential to Achieve Goals: Fair    Frequency Min 3X/week   Barriers to discharge        Co-evaluation               End of Session                 Time: 1214-1242 PT Time Calculation (min) (ACUTE ONLY): 28 min   Charges:   PT Evaluation $Initial PT Evaluation Tier I: 1 Procedure PT Treatments $Therapeutic Activity: 8-22 mins   PT G Codes:        Vina Byrd, Tessie Fass 05/15/2015, 4:36 PM 05/15/2015  Donnella Sham, PT [phone]  (pager)

## 2015-05-15 NOTE — Progress Notes (Signed)
[facility] ROUNDING NOTE   Subjective:   Interval History: no complaints  Objective:  Vital signs in last 24 hours:  Temp:  [97.5 F (36.4 C)-98.6 F (37 C)] 97.5 F (36.4 C) (09/01 1153) Pulse Rate:  [77-104] 96 (09/01 1153) Resp:  [14-29] 19 (09/01 1153) BP: (127-152)/(72-86) 148/80 mmHg (09/01 1153) SpO2:  [92 %-100 %] 100 % (09/01 1153)  Weight change: 1.485 kg (3 lb 4.4 oz) Filed Weights   05/14/15 0022 05/14/15 0927 05/14/15 1346  Weight: 71.215 kg (157 lb) 72.7 kg (160 lb 4.4 oz) 70.2 kg (154 lb 12.2 oz)    Intake/Output: I/O last 3 completed shifts: In: 100 [IV Piggyback:100] Out: 2001 [Other:2001]   Intake/Output this shift:     CVS- RRR RS- CTA ABD- BS present soft non-distended EXT- no edema   Basic Metabolic Panel:  Recent Labs Lab 05/14/15 0130 05/14/15 0640 05/15/15 0315  NA 133* 134* 136  K 4.3 4.5 4.6  CL 93* 92* 98*  CO2 26 25 25   GLUCOSE 136* 125* 75  BUN 17 20 16   CREATININE 5.31* 5.20* 3.77*  CALCIUM 8.8* 8.6* 8.4*  PHOS  --  7.1*  --     Liver Function Tests:  Recent Labs Lab 05/14/15 0130 05/14/15 0640 05/15/15 0315  AST 45*  --  41  ALT 25  --  22  ALKPHOS 101  --  98  BILITOT 0.5  --  0.9  PROT 7.1  --  6.5  ALBUMIN 2.4* 2.3* 2.2*   No results for input(s): LIPASE, AMYLASE in the last 168 hours. No results for input(s): AMMONIA in the last 168 hours.  CBC:  Recent Labs Lab 05/14/15 0130 05/15/15 0750  WBC 13.4* 9.6  NEUTROABS 11.3*  --   HGB 11.4* 12.1*  HCT 36.8* 39.5  MCV 90.2 91.2  PLT 287 209    Cardiac Enzymes:  Recent Labs Lab 05/14/15 0130  TROPONINI 0.32*    BNP: Invalid input(s): POCBNP  CBG: No results for input(s): GLUCAP in the last 168 hours.  Microbiology: Results for orders placed or performed during the hospital encounter of 05/13/15  Culture, blood (routine x 2)     Status: None (Preliminary result)   Collection Time: 05/14/15  4:00 AM  Result Value Ref  Range Status   Specimen Description BLOOD LEFT WRIST  Final   Special Requests BOTTLES DRAWN AEROBIC ONLY 1CC  Final   Culture NO GROWTH 1 DAY  Final   Report Status PENDING  Incomplete  Culture, blood (routine x 2)     Status: None (Preliminary result)   Collection Time: 05/14/15  4:09 AM  Result Value Ref Range Status   Specimen Description BLOOD LEFT ANTECUBITAL  Final   Special Requests BOTTLES DRAWN AEROBIC ONLY 5CC  Final   Culture NO GROWTH 1 DAY  Final   Report Status PENDING  Incomplete    Coagulation Studies:  Recent Labs  05/14/15 0130  LABPROT 15.3*  INR 1.20    Urinalysis: No results for input(s): COLORURINE, LABSPEC, PHURINE, GLUCOSEU, HGBUR, BILIRUBINUR, KETONESUR, PROTEINUR, UROBILINOGEN, NITRITE, LEUKOCYTESUR in the last 72 hours.  Invalid input(s): APPERANCEUR    Imaging: Dg Chest Port 1 View  05/15/2015   CLINICAL DATA:  Pulmonary edema, pulmonary fibrosis, end-stage renal disease on dialysis, acute on chronic diastolic CHF  EXAM: PORTABLE CHEST - 1 VIEW  COMPARISON:  Portable exam 0620 hours compared to 05/14/2015  FINDINGS: Enlargement of cardiac silhouette with pulmonary vascular congestion.  Diffuse  pulmonary infiltrates favoring pulmonary edema over infection, slightly improved.  Bibasilar effusions.  No pneumothorax.  Bones unremarkable.  IMPRESSION: Enlargement of cardiac silhouette with pulmonary vascular congestion.  Bibasilar effusions with slightly improved BILATERAL pulmonary infiltrates, favor pulmonary edema/CHF.   Electronically Signed   By: Lavonia Dana M.D.   On: 05/15/2015 07:59   Dg Chest Port 1 View  05/14/2015   CLINICAL DATA:  Worsening dyspnea  EXAM: PORTABLE CHEST - 1 VIEW  COMPARISON:  04/28/2015  FINDINGS: There are extensive alveolar opacities throughout both lungs, with more confluent consolidation in the bases. There are pleural effusions bilaterally. This may represent infectious infiltrate. A combination of infectious infiltrate and  alveolar edema would also be a consideration.  IMPRESSION: Alveolar consolidation with air bronchograms in both bases, as well as more diffuse ground-glass opacities throughout both lungs. Infectious infiltrates and/or alveolar edema are the leading considerations. Pleural effusions are present bilaterally   Electronically Signed   By: Andreas Newport M.D.   On: 05/14/2015 01:31     Medications:     . aspirin EC  81 mg Oral Daily  . atorvastatin  40 mg Oral q1800  . carvedilol  3.125 mg Oral BID WC  . feeding supplement (NEPRO CARB STEADY)  237 mL Oral TID BM  . feeding supplement (PRO-STAT SUGAR FREE 64)  30 mL Oral BID  . guaiFENesin  600 mg Oral BID  . heparin  5,000 Units Subcutaneous 3 times per day  . hydroxychloroquine  200 mg Oral Daily  . multivitamin  1 tablet Oral QHS  . pantoprazole  40 mg Oral Daily  . piperacillin-tazobactam (ZOSYN)  IV  2.25 g Intravenous Q8H  . sevelamer carbonate  1,600 mg Oral TID WC  . 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

## 2015-05-15 NOTE — Clinical Documentation Improvement (Signed)
Internal Medicine Nephrology/Renal  "Acute respiratory distress" currently documented in the chart.  Please clarify if this can be further specified.   Acute respiratory failure with hypoxia  Acute respiratory distress only as currently documented  Other  Clinically Undetermined  Supporting Information: Shortness of breath, tachypnea, required bipap and dialysis.      Please exercise your independent, professional judgment when responding. A specific answer is not anticipated or expected.   Thank you, Mateo Flow, RN [phone] Clinical Documentation Specialist

## 2015-05-15 NOTE — Progress Notes (Signed)
[place] TEAM 1 - Stepdown/ICU TEAM Progress Note  Travis [name] [id number] DOB: 08/10/1941 DOA: 05/13/2015 PCP: [name], MD  Admit HPI / Brief Narrative: 74 y.o. BM PMHx Pulmonary Fibrosis, Hypotension, ESRD on HD M/W/F, Chronic Diastolic and Systolic CHF,Sjogren's disease( Scleroderma) follows with Dr.Angela Hawkes of (Rheumatology), GERD,   Presented with complaints of the sudden onset of shortness of breath. Did not have any complaints of chest pain nausea vomiting or choking episodes. No fever no chills. No diarrhea no constipation  HPI/Subjective: 9/1 A/O 4, positive fatigue, positive SOB. Patient states has not missed HD appointments. Does not believe he ate any additional fluids with heavy salts, negative alcohol consumption.  Assessment/Plan: Acute respiratory distress/HCAP -Resolved with hemodialysis  - Weight postdialysis 70 .2kg  -Continue current antibiotics  Pulmonary fibrosis -Per [facility] M note 5/17 patient requires follow-up HRCT, -Since patient continues to have SOB post HD will obtain HRCT  Sjogren's disease - Scleroderma  -In May patient was discharged from [facility] M outpatient on prednisone 5 mg daily however MAR does not show patient as currently on steroid. 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  HD associated Hypotension  -BP stable  Acute on chronic diastolic CHF -TTE Aug [id number] w/ EF 35-40% -Continue Coreg 3.125 mg BID  History of DVT  -not on anticoagulation due to prior history of renal hematoma  - s/p IVC filter 02/2015   Protein-calorie malnutrition, severe -Continue nutritional supplements -Out of bed chair daily -PT/OT consult CIR vs SNF   Code Status: DO NOT RESUSCITATE Family Communication: Wife present at time of exam Disposition Plan: Per nephrology    Consultants: Dr.Martin Justin Mend,  (nephrology)    Procedure/Significant Events:  NA   Culture 8/31 blood left wrist/AC NGTD  Antibiotics: Zosyn 8/30 > Vanc 8/30 >  DVT prophylaxis: Subcutaneous heparin   Devices    LINES / TUBES:      Continuous Infusions:   Objective: VITAL SIGNS: Temp: 98.6 F (37 C) (09/01 2006) Temp Source: Oral (09/01 2006) BP: 136/74 mmHg (09/01 2006) Pulse Rate: 103 (09/01 2006) SPO2; FIO2:   Intake/Output Summary (Last 24 hours) at 05/15/15 2130 Last data filed at 05/15/15 1800  Gross per 24 hour  Intake    620 ml  Output      0 ml  Net    620 ml     Exam: General: A/O 4, NAD, No acute respiratory distress Eyes: Negative headache, eye pain, double vision,negative scleral hemorrhage ENT: Negative Runny nose, negative ear pain, negative tinnitus, negative gingival bleeding, Skin; discoloration on face arms and legs consistent with scleroderma Neck:  Negative scars, masses, torticollis, lymphadenopathy, JVD Lungs: bibasilar crackles Cardiovascular: Regular rate and rhythm without murmur gallop or rub normal S1 and S2 Abdomen:negative abdominal pain, negative dysphagia, nondistended, positive soft, bowel sounds, no rebound, no ascites, no appreciable mass Extremities: No significant cyanosis, clubbing, or edema bilateral lower extremities Psychiatric:  Negative depression, negative anxiety, negative fatigue, negative mania  Neurologic:  Cranial nerves II through XII intact, tongue/uvula midline, all extremities muscle strength 5/5, sensation intact throughout, negative dysarthria, negative expressive aphasia, negative receptive aphasia.   Data Reviewed: Basic Metabolic Panel:  Recent Labs Lab 05/14/15 0130 05/14/15 0640 05/15/15 0315  NA 133* 134* 136  K 4.3 4.5 4.6  CL 93* 92* 98*  CO2 26 25 25   GLUCOSE 136* 125* 75  BUN 17 20 16   CREATININE 5.31*  5.20* 3.77*  CALCIUM 8.8* 8.6* 8.4*  PHOS  --  7.1*  --    Liver Function Tests:  Recent Labs Lab  05/14/15 0130 05/14/15 0640 05/15/15 0315  AST 45*  --  41  ALT 25  --  22  ALKPHOS 101  --  98  BILITOT 0.5  --  0.9  PROT 7.1  --  6.5  ALBUMIN 2.4* 2.3* 2.2*   No results for input(s): LIPASE, AMYLASE in the last 168 hours. No results for input(s): AMMONIA in the last 168 hours. CBC:  Recent Labs Lab 05/14/15 0130 05/15/15 0750  WBC 13.4* 9.6  NEUTROABS 11.3*  --   HGB 11.4* 12.1*  HCT 36.8* 39.5  MCV 90.2 91.2  PLT 287 209   Cardiac Enzymes:  Recent Labs Lab 05/14/15 0130  TROPONINI 0.32*   BNP (last 3 results)  Recent Labs  01/06/15 1758 04/18/15 1626 05/14/15 0130  BNP 83.3 >4500.0* >4500.0*    ProBNP (last 3 results) No results for input(s): PROBNP in the last 8760 hours.  CBG: No results for input(s): GLUCAP in the last 168 hours.  Recent Results (from the past 240 hour(s))  Culture, blood (routine x 2)     Status: None (Preliminary result)   Collection Time: 05/14/15  4:00 AM  Result Value Ref Range Status   Specimen Description BLOOD LEFT WRIST  Final   Special Requests BOTTLES DRAWN AEROBIC ONLY 1CC  Final   Culture NO GROWTH 1 DAY  Final   Report Status PENDING  Incomplete  Culture, blood (routine x 2)     Status: None (Preliminary result)   Collection Time: 05/14/15  4:09 AM  Result Value Ref Range Status   Specimen Description BLOOD LEFT ANTECUBITAL  Final   Special Requests BOTTLES DRAWN AEROBIC ONLY 5CC  Final   Culture NO GROWTH 1 DAY  Final   Report Status PENDING  Incomplete     Studies:  Recent x-ray studies have been reviewed in detail by the Attending Physician  Scheduled Meds:  Scheduled Meds: . antiseptic oral rinse  7 mL Mouth Rinse BID  . aspirin EC  81 mg Oral Daily  . atorvastatin  40 mg Oral q1800  . carvedilol  3.125 mg Oral BID WC  . feeding supplement (NEPRO CARB STEADY)  237 mL Oral TID BM  . feeding supplement (PRO-STAT SUGAR FREE 64)  30 mL Oral BID  . guaiFENesin  600 mg Oral BID  . heparin  5,000  Units Subcutaneous 3 times per day  . hydroxychloroquine  200 mg Oral Daily  . multivitamin  1 tablet Oral QHS  . pantoprazole  40 mg Oral Daily  . piperacillin-tazobactam (ZOSYN)  IV  2.25 g Intravenous Q8H  . sevelamer carbonate  1,600 mg Oral TID WC  . vancomycin  750 mg Intravenous Q M,W,F-HD    Time spent on care of this patient: 40 mins   WOODS, Geraldo Docker , MD  [facility] Office  [phone] Pager - [phone]  On-Call/Text Page:      [email or web address]      password [email or web address]  If 7PM-7AM, please contact night-coverage [email or web address] Password [email or web address] 05/15/2015, 9:30 PM   LOS: 1 day   Care during the described time interval was provided by me .  I have reviewed this patient's available data, including medical history, events of note, physical examination, and all test results as part of my evaluation. I have personally reviewed and interpreted all radiology studies.   Vicente Serene  Sherral Hammers, [place] Pager

## 2015-05-16 ENCOUNTER — Inpatient Hospital Stay (HOSPITAL_COMMUNITY): Payer: Medicare Other

## 2015-05-16 ENCOUNTER — Encounter: Payer: Medicare Other | Admitting: Cardiology

## 2015-05-16 ENCOUNTER — Encounter (HOSPITAL_COMMUNITY): Payer: Self-pay

## 2015-05-16 LAB — CBC WITH DIFFERENTIAL/PLATELET
BASOS PCT: 1 % (ref 0–1)
Basophils Absolute: 0.1 10*3/uL (ref 0.0–0.1)
Eosinophils Absolute: 0.6 10*3/uL (ref 0.0–0.7)
Eosinophils Relative: 6 % — ABNORMAL HIGH (ref 0–5)
HEMATOCRIT: 33.4 % — AB (ref 39.0–52.0)
HEMOGLOBIN: 10.2 g/dL — AB (ref 13.0–17.0)
LYMPHS ABS: 0.4 10*3/uL — AB (ref 0.7–4.0)
LYMPHS PCT: 4 % — AB (ref 12–46)
MCH: 27.5 pg (ref 26.0–34.0)
MCHC: 30.5 g/dL (ref 30.0–36.0)
MCV: 90 fL (ref 78.0–100.0)
MONOS PCT: 12 % (ref 3–12)
Monocytes Absolute: 1.3 10*3/uL — ABNORMAL HIGH (ref 0.1–1.0)
NEUTROS ABS: 8.5 10*3/uL — AB (ref 1.7–7.7)
NEUTROS PCT: 79 % — AB (ref 43–77)
Platelets: 232 10*3/uL (ref 150–400)
RBC: 3.71 MIL/uL — ABNORMAL LOW (ref 4.22–5.81)
RDW: 19.3 % — ABNORMAL HIGH (ref 11.5–15.5)
WBC: 10.8 10*3/uL — ABNORMAL HIGH (ref 4.0–10.5)

## 2015-05-16 LAB — CBC
HEMATOCRIT: 38.2 % — AB (ref 39.0–52.0)
Hemoglobin: 12.1 g/dL — ABNORMAL LOW (ref 13.0–17.0)
MCH: 28.5 pg (ref 26.0–34.0)
MCHC: 31.7 g/dL (ref 30.0–36.0)
MCV: 89.9 fL (ref 78.0–100.0)
Platelets: 210 10*3/uL (ref 150–400)
RBC: 4.25 MIL/uL (ref 4.22–5.81)
RDW: 19.3 % — AB (ref 11.5–15.5)
WBC: 11 10*3/uL — ABNORMAL HIGH (ref 4.0–10.5)

## 2015-05-16 LAB — COMPREHENSIVE METABOLIC PANEL
ALBUMIN: 2.1 g/dL — AB (ref 3.5–5.0)
ALK PHOS: 91 U/L (ref 38–126)
ALT: 22 U/L (ref 17–63)
ANION GAP: 10 (ref 5–15)
AST: 36 U/L (ref 15–41)
BILIRUBIN TOTAL: 0.8 mg/dL (ref 0.3–1.2)
BUN: 24 mg/dL — ABNORMAL HIGH (ref 6–20)
CALCIUM: 8.4 mg/dL — AB (ref 8.9–10.3)
CO2: 28 mmol/L (ref 22–32)
Chloride: 95 mmol/L — ABNORMAL LOW (ref 101–111)
Creatinine, Ser: 5.1 mg/dL — ABNORMAL HIGH (ref 0.61–1.24)
GFR calc Af Amer: 12 mL/min — ABNORMAL LOW (ref >60)
GFR calc non Af Amer: 10 mL/min — ABNORMAL LOW (ref >60)
Glucose, Bld: 89 mg/dL (ref 65–99)
Potassium: 3.9 mmol/L (ref 3.5–5.1)
Sodium: 133 mmol/L — ABNORMAL LOW (ref 135–145)
TOTAL PROTEIN: 6.6 g/dL (ref 6.5–8.1)

## 2015-05-16 LAB — MAGNESIUM: Magnesium: 1.8 mg/dL (ref 1.7–2.4)

## 2015-05-16 LAB — C-REACTIVE PROTEIN: CRP: 5.8 mg/dL — AB (ref <1.0)

## 2015-05-16 LAB — SEDIMENTATION RATE: Sed Rate: 70 mm/hr — ABNORMAL HIGH (ref 0–16)

## 2015-05-16 MED ORDER — HEPARIN SODIUM (PORCINE) 1000 UNIT/ML DIALYSIS: 1000.0000 [IU] | INTRAMUSCULAR | Status: DC | PRN

## 2015-05-16 MED ORDER — PENTAFLUOROPROP-TETRAFLUOROETH EX AERO: 1.0000 "application " | INHALATION_SPRAY | CUTANEOUS | Status: DC | PRN

## 2015-05-16 MED ORDER — SODIUM CHLORIDE 0.9 % IV SOLN: 100.0000 mL | INTRAVENOUS | Status: DC | PRN

## 2015-05-16 MED ORDER — LIDOCAINE HCL (PF) 1 % IJ SOLN: 5.0000 mL | INTRAMUSCULAR | Status: DC | PRN

## 2015-05-16 MED ORDER — ALTEPLASE 2 MG IJ SOLR: 2.0000 mg | Freq: Once | INTRAMUSCULAR | Status: DC | PRN

## 2015-05-16 MED ORDER — LIDOCAINE-PRILOCAINE 2.5-2.5 % EX CREA: 1.0000 "application " | TOPICAL_CREAM | CUTANEOUS | Status: DC | PRN

## 2015-05-16 NOTE — Care Management Important Message (Signed)
Important Message  Patient Details  Name: Travis [name] MRN: [id number] Date of Birth: 25-Mar-1941   Medicare Important Message Given:  [facility] notification given    Nathen May 05/16/2015, 10:41 AMImportant Message  Patient Details  Name: Travis [name] MRN: [id number] Date of Birth: 12/02/1940   Medicare Important Message Given:  Yes-second notification given    Nathen May 05/16/2015, 10:41 AM

## 2015-05-16 NOTE — Progress Notes (Signed)
[facility] ROUNDING NOTE   Subjective:   Interval History:  No complaints today appears to be doing well no shortness of breath  Objective:  Vital signs in last 24 hours:  Temp:  [97.5 F (36.4 C)-98.8 F (37.1 C)] 98.2 F (36.8 C) (09/02 0753) Pulse Rate:  [95-104] 95 (09/02 0744) Resp:  [19-24] 20 (09/02 0744) BP: (136-148)/(66-80) 143/71 mmHg (09/02 0744) SpO2:  [99 %-100 %] 100 % (09/02 0744) Weight:  [70.6 kg (155 lb 10.3 oz)] 70.6 kg (155 lb 10.3 oz) (09/02 0535)  Weight change: -2.1 kg (-4 lb 10.1 oz) Filed Weights   05/14/15 0927 05/14/15 1346 05/16/15 0535  Weight: 72.7 kg (160 lb 4.4 oz) 70.2 kg (154 lb 12.2 oz) 70.6 kg (155 lb 10.3 oz)    Intake/Output: I/O last 3 completed shifts: In: 53 [P.O.:640; IV Piggyback:250] Out: -    Intake/Output this shift:     CVS- RRR RS- CTA ABD- BS present soft non-distended EXT- no edema  Dry skin AVF thrill   Basic Metabolic Panel:  Recent Labs Lab 05/14/15 0130 05/14/15 0640 05/15/15 0315 05/16/15 0325  NA 133* 134* 136 133*  K 4.3 4.5 4.6 3.9  CL 93* 92* 98* 95*  CO2 26 25 25 28   GLUCOSE 136* 125* 75 89  BUN 17 20 16  24*  CREATININE 5.31* 5.20* 3.77* 5.10*  CALCIUM 8.8* 8.6* 8.4* 8.4*  MG  --   --   --  1.8  PHOS  --  7.1*  --   --     Liver Function Tests:  Recent Labs Lab 05/14/15 0130 05/14/15 0640 05/15/15 0315 05/16/15 0325  AST 45*  --  41 36  ALT 25  --  22 22  ALKPHOS 101  --  98 91  BILITOT 0.5  --  0.9 0.8  PROT 7.1  --  6.5 6.6  ALBUMIN 2.4* 2.3* 2.2* 2.1*   No results for input(s): LIPASE, AMYLASE in the last 168 hours. No results for input(s): AMMONIA in the last 168 hours.  CBC:  Recent Labs Lab 05/14/15 0130 05/15/15 0750 05/16/15 0325  WBC 13.4* 9.6 10.8*  NEUTROABS 11.3*  --  8.5*  HGB 11.4* 12.1* 10.2*  HCT 36.8* 39.5 33.4*  MCV 90.2 91.2 90.0  PLT 287 209 232    Cardiac Enzymes:  Recent Labs Lab 05/14/15 0130  TROPONINI 0.32*     BNP: Invalid input(s): POCBNP  CBG: No results for input(s): GLUCAP in the last 168 hours.  Microbiology: Results for orders placed or performed during the hospital encounter of 05/13/15  Culture, blood (routine x 2)     Status: None (Preliminary result)   Collection Time: 05/14/15  4:00 AM  Result Value Ref Range Status   Specimen Description BLOOD LEFT WRIST  Final   Special Requests BOTTLES DRAWN AEROBIC ONLY 1CC  Final   Culture NO GROWTH 1 DAY  Final   Report Status PENDING  Incomplete  Culture, blood (routine x 2)     Status: None (Preliminary result)   Collection Time: 05/14/15  4:09 AM  Result Value Ref Range Status   Specimen Description BLOOD LEFT ANTECUBITAL  Final   Special Requests BOTTLES DRAWN AEROBIC ONLY 5CC  Final   Culture NO GROWTH 1 DAY  Final   Report Status PENDING  Incomplete    Coagulation Studies:  Recent Labs  05/14/15 0130  LABPROT 15.3*  INR 1.20    Urinalysis: No results for input(s): COLORURINE, LABSPEC, PHURINE,  GLUCOSEU, HGBUR, BILIRUBINUR, KETONESUR, PROTEINUR, UROBILINOGEN, NITRITE, LEUKOCYTESUR in the last 72 hours.  Invalid input(s): APPERANCEUR    Imaging: Dg Chest Port 1 View  05/15/2015   CLINICAL DATA:  Pulmonary edema, pulmonary fibrosis, end-stage renal disease on dialysis, acute on chronic diastolic CHF  EXAM: PORTABLE CHEST - 1 VIEW  COMPARISON:  Portable exam 0620 hours compared to 05/14/2015  FINDINGS: Enlargement of cardiac silhouette with pulmonary vascular congestion.  Diffuse pulmonary infiltrates favoring pulmonary edema over infection, slightly improved.  Bibasilar effusions.  No pneumothorax.  Bones unremarkable.  IMPRESSION: Enlargement of cardiac silhouette with pulmonary vascular congestion.  Bibasilar effusions with slightly improved BILATERAL pulmonary infiltrates, favor pulmonary edema/CHF.   Electronically Signed   By: Lavonia Dana M.D.   On: 05/15/2015 07:59     Medications:     . antiseptic oral rinse   7 mL Mouth Rinse BID  . aspirin EC  81 mg Oral Daily  . atorvastatin  40 mg Oral q1800  . carvedilol  3.125 mg Oral BID WC  . feeding supplement (NEPRO CARB STEADY)  237 mL Oral TID BM  . feeding supplement (PRO-STAT SUGAR FREE 64)  30 mL Oral BID  . guaiFENesin  600 mg Oral BID  . heparin  5,000 Units Subcutaneous 3 times per day  . hydroxychloroquine  200 mg Oral Daily  . multivitamin  1 tablet Oral QHS  . pantoprazole  40 mg Oral Daily  . piperacillin-tazobactam (ZOSYN)  IV  2.25 g Intravenous Q8H  . sevelamer carbonate  1,600 mg Oral TID WC  . vancomycin  750 mg Intravenous Q M,W,F-HD   hydrOXYzine, metoprolol, white petrolatum  Assessment/ Plan:   ESRD-  Tolerating dialysis MWF  ANEMIA- Hb stable  MBD- controlled  HTN/VOL- improved  Weight loss need to lower EDW in center  ACCESS-  No issues      LOS: 2 Imanuel Pruiett W @TODAY @9 :15 AM

## 2015-05-16 NOTE — Progress Notes (Signed)
Inpatient Rehabilitation  Patient was screened by Gerlean Ren for appropriateness for an Inpatient Acute Rehab consult.  At this time, we are not recommending Inpatient Rehab consult.  It is most unlikely that [facility] Medicare will  approve an IP Rehab admission with current diagnoses.  If acute team would like me to further pursue, please contact me.    [place] Admissions Coordinator Cell [phone] Office [phone]

## 2015-05-16 NOTE — Progress Notes (Signed)
ANTIBIOTIC CONSULT NOTE - FOLLOW UP  Pharmacy Consult for Vancomycin and Zosyn Indication: pneumonia  No Known Allergies  Patient Measurements: Height: 5\' 10"  (177.8 cm) Weight: 155 lb 10.3 oz (70.6 kg) IBW/kg (Calculated) : 73  Vital Signs: Temp: 98.2 F (36.8 C) (09/02 0753) Temp Source: Oral (09/02 0753) BP: 143/71 mmHg (09/02 0744) Pulse Rate: 95 (09/02 0744) Intake/Output from previous day: 09/01 0701 - 09/02 0700 In: 790 [P.O.:640; IV Piggyback:150] Out: -  Intake/Output from this shift: Total I/O In: 240 [P.O.:240] Out: -   Labs:  Recent Labs  05/14/15 0130 05/14/15 0640 05/15/15 0315 05/15/15 0750 05/16/15 0325  WBC 13.4*  --   --  9.6 10.8*  HGB 11.4*  --   --  12.1* 10.2*  PLT 287  --   --  209 232  CREATININE 5.31* 5.20* 3.77*  --  5.10*   Estimated Creatinine Clearance: 12.9 mL/min (by C-G formula based on Cr of 5.1). No results for input(s): VANCOTROUGH, VANCOPEAK, VANCORANDOM, GENTTROUGH, GENTPEAK, GENTRANDOM, TOBRATROUGH, TOBRAPEAK, TOBRARND, AMIKACINPEAK, AMIKACINTROU, AMIKACIN in the last 72 hours.   Microbiology: Recent Results (from the past 720 hour(s))  MRSA PCR Screening     Status: None   Collection Time: 04/18/15 10:19 PM  Result Value Ref Range Status   MRSA by PCR NEGATIVE NEGATIVE Final    Comment:        The GeneXpert MRSA Assay (FDA approved for NASAL specimens only), is one component of a comprehensive MRSA colonization surveillance program. It is not intended to diagnose MRSA infection nor to guide or monitor treatment for MRSA infections.   Culture, blood (x 2)     Status: None   Collection Time: 04/19/15  1:12 AM  Result Value Ref Range Status   Specimen Description BLOOD LEFT HAND  Final   Special Requests IN PEDIATRIC BOTTLE 2CC  Final   Culture NO GROWTH 5 DAYS  Final   Report Status 04/24/2015 FINAL  Final  Culture, blood (routine x 2)     Status: None   Collection Time: 04/19/15  8:50 AM  Result Value Ref  Range Status   Specimen Description BLOOD HEMODIALYSIS CATHETER  Final   Special Requests BOTTLES DRAWN AEROBIC AND ANAEROBIC 10CC  Final   Culture NO GROWTH 5 DAYS  Final   Report Status 04/24/2015 FINAL  Final  Culture, blood (routine x 2)     Status: None   Collection Time: 04/19/15  9:05 AM  Result Value Ref Range Status   Specimen Description BLOOD HEMODIALYSIS CATHETER  Final   Special Requests BOTTLES DRAWN AEROBIC AND ANAEROBIC 10CC  Final   Culture NO GROWTH 5 DAYS  Final   Report Status 04/24/2015 FINAL  Final  C difficile quick scan w PCR reflex     Status: None   Collection Time: 04/20/15  8:40 AM  Result Value Ref Range Status   C Diff antigen NEGATIVE NEGATIVE Final   C Diff toxin NEGATIVE NEGATIVE Final   C Diff interpretation Negative for toxigenic C. difficile  Final  Culture, blood (routine x 2)     Status: None (Preliminary result)   Collection Time: 05/14/15  4:00 AM  Result Value Ref Range Status   Specimen Description BLOOD LEFT WRIST  Final   Special Requests BOTTLES DRAWN AEROBIC ONLY 1CC  Final   Culture NO GROWTH 1 DAY  Final   Report Status PENDING  Incomplete  Culture, blood (routine x 2)     Status: None (Preliminary result)  Collection Time: 05/14/15  4:09 AM  Result Value Ref Range Status   Specimen Description BLOOD LEFT ANTECUBITAL  Final   Special Requests BOTTLES DRAWN AEROBIC ONLY 5CC  Final   Culture NO GROWTH 1 DAY  Final   Report Status PENDING  Incomplete    Anti-infectives    Start     Dose/Rate Route Frequency Ordered Stop   05/14/15 1200  vancomycin (VANCOCIN) IVPB 750 mg/150 ml premix     750 mg 150 mL/hr over 60 Minutes Intravenous Every M-W-F (Hemodialysis) 05/14/15 0440     05/14/15 1200  piperacillin-tazobactam (ZOSYN) IVPB 2.25 g     2.25 g 100 mL/hr over 30 Minutes Intravenous Every 8 hours 05/14/15 0440     05/14/15 1000  hydroxychloroquine (PLAQUENIL) tablet 200 mg     200 mg Oral Daily 05/14/15 0524     05/14/15 0445   vancomycin (VANCOCIN) 500 mg in sodium chloride 0.9 % 100 mL IVPB     500 mg 100 mL/hr over 60 Minutes Intravenous  Once 05/14/15 0440 05/14/15 0606   05/14/15 0315  piperacillin-tazobactam (ZOSYN) IVPB 3.375 g     3.375 g 100 mL/hr over 30 Minutes Intravenous  Once 05/14/15 0305 05/14/15 0419   05/14/15 0315  vancomycin (VANCOCIN) IVPB 1000 mg/200 mL premix     1,000 mg 200 mL/hr over 60 Minutes Intravenous  Once 05/14/15 0305 05/14/15 0421      Assessment: 74 year old male with respiratory distress that improved after HD. He was started on Vancomycin and Zosyn for possible HCAP. Today is Day #3 of antibiotic therapy. Note he is HD dependent and his antibiotic regimen is appropriate for this.  Goal of Therapy:  preHD Vancomycin 15-25 mcg/ml  Plan:  Continue Zosyn 2.25gm IV q8h Continue Vancomycin 750mg  IV with each HD session Check preHD Vancomycin level at steady state Monitor HD schedule and tolerance Follow available micro data Consider antibiotic deescalation   Legrand Como, Pharm.D., BCPS, AAHIVP Clinical Pharmacist Phone: [phone] or [phone] 05/16/2015, 11:22 AM

## 2015-05-16 NOTE — Progress Notes (Signed)
Physical Therapy Treatment Patient Details Name: Travis [name] MRN: [id number] DOB: 04/17/1941 Today's Date: 05/16/2015    History of Present Illness pt is a 74 y/o male with PNHx of ESRD diastolic HF HTN admitted with SOB.    PT Comments    Slow to progress with mobility, but able to tolerate three trials of ambulation with short rest between   Follow Up Recommendations  CIR     Equipment Recommendations  None recommended by PT    Recommendations for Other Services       Precautions / Restrictions Precautions Precautions: Fall    Mobility  Bed Mobility Overal bed mobility: Needs Assistance Bed Mobility: Supine to Sit   Sidelying to sit: Min assist       General bed mobility comments: needed truncal assist  Transfers Overall transfer level: Needs assistance Equipment used: Rolling walker (2 wheeled) Transfers: Sit to/from Stand Sit to Stand: Min assist;+2 safety/equipment         General transfer comment: cues for hand placement, help to get pt forward and to control descent  Ambulation/Gait Ambulation/Gait assistance: Min assist;Mod assist (mod as fatigued) Ambulation Distance (Feet): 12 Feet (then 40 feet then 20 feet with rest in between) Assistive device: Rolling walker (2 wheeled) Gait Pattern/deviations: Step-through pattern Gait velocity: slow Gait velocity interpretation: Below normal speed for age/gender General Gait Details: more assist as pt fatigued, stability assist with LOB on backing up.   Stairs            Wheelchair Mobility    Modified Rankin (Stroke Patients Only)       Balance     Sitting balance-Leahy Scale: Fair       Standing balance-Leahy Scale: Poor                      Cognition Arousal/Alertness: Awake/alert Behavior During Therapy: Flat affect Overall Cognitive Status: Impaired/Different from baseline Area of Impairment: Attention;Following commands;Problem solving Orientation Level:  Situation Current Attention Level: Sustained   Following Commands: Follows one step commands with increased time     Problem Solving: Slow processing      Exercises      General Comments        Pertinent Vitals/Pain Pain Assessment: Faces Faces Pain Scale: Hurts a little bit Pain Location: sore bottom Pain Descriptors / Indicators: Sore Pain Intervention(s): Monitored during session    Home Living                      Prior Function            PT Goals (current goals can now be found in the care plan section) Acute Rehab PT Goals Patient Stated Goal: none stated PT Goal Formulation: With patient/family Time For Goal Achievement: 05/29/15 Potential to Achieve Goals: Fair Progress towards PT goals: Progressing toward goals    Frequency  Min 3X/week    PT Plan Current plan remains appropriate    Co-evaluation             End of Session   Activity Tolerance: Patient tolerated treatment well;Patient limited by fatigue Patient left: in chair;with call bell/phone within reach     Time: 1057-1120 PT Time Calculation (min) (ACUTE ONLY): 23 min  Charges:  $Gait Training: 8-22 mins $Therapeutic Activity: 8-22 mins                    G Codes:      Ginevra Tacker, Tessie Fass 05/16/2015,  2:23 PM 05/16/2015  Donnella Sham, PT [phone]  (pager)

## 2015-05-16 NOTE — Procedures (Signed)
I have seen and examined this patient and agree with the plan of care. Seen on dialysis with no complaints  Hb 12.1   K 4.6  Travis [name] W 05/16/2015, 6:07 PM

## 2015-05-16 NOTE — Progress Notes (Signed)
[place] TEAM 1 - Stepdown/ICU TEAM PROGRESS NOTE  Travis [name] [id number] DOB: 1941-03-28 DOA: 05/13/2015 PCP: [name], MD  Admit HPI / Brief Narrative: 74 y.o. male with history of ESRD on hemodialysis, chronic diastolic heart failure, GERD, and dialysis associated hypotension who presented with complaints of the sudden onset of shortness of breath.  Did not have any complaints of chest pain nausea vomiting or choking episodes. No fever no chills. No diarrhea no constipation.  HPI/Subjective: Feeling better in general.  No new complaints today.  Tolerating HD.    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  - nephrology following - dialysis ongoing  HD associated Hypotension  BP has been holding stable with dialysis thus far  Sjogren's disease - Scleroderma  -In May patient was discharged by [facility] outpatient on prednisone 5 mg daily however MAR does not show patient as currently on steroid -Continue Plaquenil 200 mg daily -no convincing evidence of lung involvement per HRCT chest   Acute on chronic diastolic CHF TTE Aug [id number] w/ EF 35-40%  History of DVT  not on anticoagulation due to prior history of renal hematoma - s/p IVC filter 02/2015   Protein-calorie malnutrition, severe  Code Status: DNR Family Communication: no family present at time of exam  Disposition  Plan: SDU  Consultants: Nephrology   Procedures: none  Antibiotics: Zosyn 8/30 > Vanc 8/30 >  DVT prophylaxis: SQ heparin   Objective: Blood pressure 127/75, pulse 93, temperature 97.8 F (36.6 C), temperature source Oral, resp. rate 23, height 5\' 10"  (1.778 m), weight 70.6 kg (155 lb 10.3 oz), SpO2 86 %.  Intake/Output Summary (Last 24 hours) at 05/16/15 1702 Last data filed at 05/16/15 1500  Gross per 24 hour  Intake    880 ml  Output      0 ml  Net    880 ml   Exam: General: No acute respiratory distress Lungs: Clear to auscultation bilaterally  Cardiovascular: Regular rate and rhythm  Abdomen: Nontender, nondistended, soft, bowel sounds positive, no rebound, no ascites, no appreciable mass Extremities: No significant cyanosis, clubbing, edema bilateral lower extremities  Data Reviewed: Basic Metabolic Panel:  Recent Labs Lab 05/14/15 0130 05/14/15 0640 05/15/15 0315 05/16/15 0325  NA 133* 134* 136 133*  K 4.3 4.5 4.6 3.9  CL 93* 92* 98* 95*  CO2 26 25 25 28   GLUCOSE 136* 125* 75 89  BUN 17 20 16  24*  CREATININE 5.31* 5.20* 3.77* 5.10*  CALCIUM 8.8* 8.6* 8.4* 8.4*  MG  --   --   --  1.8  PHOS  --  7.1*  --   --     CBC:  Recent Labs Lab 05/14/15 0130 05/15/15 0750 05/16/15 0325  WBC 13.4* 9.6 10.8*  NEUTROABS 11.3*  --  8.5*  HGB 11.4* 12.1* 10.2*  HCT 36.8* 39.5 33.4*  MCV 90.2 91.2 90.0  PLT 287 209 232    Liver Function Tests:  Recent Labs Lab 05/14/15 0130  05/14/15 0640 05/15/15 0315 05/16/15 0325  AST 45*  --  41 36  ALT 25  --  22 22  ALKPHOS 101  --  98 91  BILITOT 0.5  --  0.9 0.8  PROT 7.1  --  6.5 6.6  ALBUMIN 2.4* 2.3* 2.2* 2.1*    Coags:  Recent Labs Lab 05/14/15 0130  INR 1.20   Cardiac Enzymes:  Recent Labs Lab 05/14/15 0130  TROPONINI 0.32*     Studies:   Recent x-ray studies have been reviewed in detail by the Attending Physician  Scheduled Meds:  Scheduled Meds: . antiseptic oral rinse  7 mL  Mouth Rinse BID  . aspirin EC  81 mg Oral Daily  . atorvastatin  40 mg Oral q1800  . carvedilol  3.125 mg Oral BID WC  . feeding supplement (NEPRO CARB STEADY)  237 mL Oral TID BM  . feeding supplement (PRO-STAT SUGAR FREE 64)  30 mL Oral BID  . guaiFENesin  600 mg Oral BID  . heparin  5,000 Units Subcutaneous 3 times per day  . hydroxychloroquine  200 mg Oral Daily  . multivitamin  1 tablet Oral QHS  . pantoprazole  40 mg Oral Daily  . piperacillin-tazobactam (ZOSYN)  IV  2.25 g Intravenous Q8H  . sevelamer carbonate  1,600 mg Oral TID WC  . vancomycin  750 mg Intravenous Q M,W,F-HD    Time spent on care of this patient: 25 minutes    Rabia Argote T , MD   [facility] Office  [phone] Pager - Text Page per Shea Evans as per below:  On-Call/Text Page:      [email or web address]      password [email or web address]  If 7PM-7AM, please contact night-coverage [email or web address] Password [email or web address] 05/16/2015, 5:02 PM   LOS: 2 days

## 2015-05-16 NOTE — Progress Notes (Signed)
Bipap not indicated, pt is stable at this time, no distress or complications noted.

## 2015-05-16 NOTE — Progress Notes (Signed)
OT Cancellation Note  Patient Details Name: Travis [name] MRN: [id number] DOB: 1941/05/09   Cancelled Treatment:    Reason Eval/Treat Not Completed: Patient at procedure or test/ unavailable Pt at HD. Will attempt over the weekend if able.  [place], OTR/L  [phone] 05/16/2015 05/16/2015, 4:19 PM

## 2015-05-17 LAB — COMPREHENSIVE METABOLIC PANEL
ALBUMIN: 2.1 g/dL — AB (ref 3.5–5.0)
ALK PHOS: 92 U/L (ref 38–126)
ALT: 24 U/L (ref 17–63)
ANION GAP: 11 (ref 5–15)
AST: 38 U/L (ref 15–41)
BILIRUBIN TOTAL: 0.8 mg/dL (ref 0.3–1.2)
BUN: 10 mg/dL (ref 6–20)
CALCIUM: 8.4 mg/dL — AB (ref 8.9–10.3)
CO2: 27 mmol/L (ref 22–32)
Chloride: 94 mmol/L — ABNORMAL LOW (ref 101–111)
Creatinine, Ser: 3.45 mg/dL — ABNORMAL HIGH (ref 0.61–1.24)
GFR calc Af Amer: 19 mL/min — ABNORMAL LOW (ref >60)
GFR calc non Af Amer: 16 mL/min — ABNORMAL LOW (ref >60)
GLUCOSE: 96 mg/dL (ref 65–99)
Potassium: 3.6 mmol/L (ref 3.5–5.1)
SODIUM: 132 mmol/L — AB (ref 135–145)
TOTAL PROTEIN: 6.7 g/dL (ref 6.5–8.1)

## 2015-05-17 LAB — CBC
HEMATOCRIT: 36.4 % — AB (ref 39.0–52.0)
HEMOGLOBIN: 11.4 g/dL — AB (ref 13.0–17.0)
MCH: 28.8 pg (ref 26.0–34.0)
MCHC: 31.3 g/dL (ref 30.0–36.0)
MCV: 91.9 fL (ref 78.0–100.0)
Platelets: 195 10*3/uL (ref 150–400)
RBC: 3.96 MIL/uL — AB (ref 4.22–5.81)
RDW: 19.6 % — ABNORMAL HIGH (ref 11.5–15.5)
WBC: 11.4 10*3/uL — AB (ref 4.0–10.5)

## 2015-05-17 MED ORDER — SEVELAMER CARBONATE 800 MG PO TABS: 1600.0000 mg | ORAL_TABLET | Freq: Three times a day (TID) | ORAL | Status: DC

## 2015-05-17 NOTE — Progress Notes (Signed)
Received call from [facility] rep and to clarify mattress order for home. NCM contacted attending for updated orders. Faxed orders for gel overlay mattress to [facility]. Attempted to call hospital liaison and phone reached [facility] office. Info on facesheet for office to reach wife, Copeland Rygg or dtr-Shannon.  Jonnie Finner RN CCM Case Mgmt phone [phone]

## 2015-05-17 NOTE — Progress Notes (Signed)
Patient prescriptions and discharge instructions given and explained to patient and patient's wife and son. All questions answered. PIV discontinued, no signs of active bleeding or infection. Patient to be discharged via wheelchair by patient's son and wife.

## 2015-05-17 NOTE — Progress Notes (Signed)
Notified Dr. Thereasa Solo that patient requests to go home. Patient does not have PIV access at this time and wants to speak with MD before reinsertion. Unable to give scheduled medications IV.

## 2015-05-17 NOTE — Progress Notes (Signed)
[facility] ROUNDING NOTE   Subjective:   Interval History: improved shortness of breath   Objective:  Vital signs in last 24 hours:  Temp:  [97.3 F (36.3 C)-98.1 F (36.7 C)] 97.3 F (36.3 C) (09/03 0900) Pulse Rate:  [62-106] 98 (09/03 0900) Resp:  [17-29] 18 (09/03 0900) BP: (100-158)/(66-94) 147/79 mmHg (09/03 0900) SpO2:  [86 %-100 %] 100 % (09/03 0900) Weight:  [68.947 kg (152 lb)] 68.947 kg (152 lb) (09/03 0404)  Weight change: -1.653 kg (-3 lb 10.3 oz) Filed Weights   05/14/15 1346 05/16/15 0535 05/17/15 0404  Weight: 70.2 kg (154 lb 12.2 oz) 70.6 kg (155 lb 10.3 oz) 68.947 kg (152 lb)    Intake/Output: I/O last 3 completed shifts: In: 1060 [P.O.:960; IV Piggyback:100] Out: 2700 [Other:2700]   Intake/Output this shift:     CVS- RRR RS- CTA ABD- BS present soft non-distended EXT- no edema   Basic Metabolic Panel:  Recent Labs Lab 05/14/15 0130 05/14/15 0640 05/15/15 0315 05/16/15 0325 05/17/15 0232  NA 133* 134* 136 133* 132*  K 4.3 4.5 4.6 3.9 3.6  CL 93* 92* 98* 95* 94*  CO2 26 25 25 28 27   GLUCOSE 136* 125* 75 89 96  BUN 17 20 16  24* 10  CREATININE 5.31* 5.20* 3.77* 5.10* 3.45*  CALCIUM 8.8* 8.6* 8.4* 8.4* 8.4*  MG  --   --   --  1.8  --   PHOS  --  7.1*  --   --   --     Liver Function Tests:  Recent Labs Lab 05/14/15 0130 05/14/15 0640 05/15/15 0315 05/16/15 0325 05/17/15 0232  AST 45*  --  41 36 38  ALT 25  --  22 22 24   ALKPHOS 101  --  98 91 92  BILITOT 0.5  --  0.9 0.8 0.8  PROT 7.1  --  6.5 6.6 6.7  ALBUMIN 2.4* 2.3* 2.2* 2.1* 2.1*   No results for input(s): LIPASE, AMYLASE in the last 168 hours. No results for input(s): AMMONIA in the last 168 hours.  CBC:  Recent Labs Lab 05/14/15 0130 05/15/15 0750 05/16/15 0325 05/16/15 1747 05/17/15 0232  WBC 13.4* 9.6 10.8* 11.0* 11.4*  NEUTROABS 11.3*  --  8.5*  --   --   HGB 11.4* 12.1* 10.2* 12.1* 11.4*  HCT 36.8* 39.5 33.4* 38.2* 36.4*  MCV 90.2 91.2  90.0 89.9 91.9  PLT 287 209 232 210 195    Cardiac Enzymes:  Recent Labs Lab 05/14/15 0130  TROPONINI 0.32*    BNP: Invalid input(s): POCBNP  CBG: No results for input(s): GLUCAP in the last 168 hours.  Microbiology: Results for orders placed or performed during the hospital encounter of 05/13/15  Culture, blood (routine x 2)     Status: None (Preliminary result)   Collection Time: 05/14/15  4:00 AM  Result Value Ref Range Status   Specimen Description BLOOD LEFT WRIST  Final   Special Requests BOTTLES DRAWN AEROBIC ONLY 1CC  Final   Culture NO GROWTH 2 DAYS  Final   Report Status PENDING  Incomplete  Culture, blood (routine x 2)     Status: None (Preliminary result)   Collection Time: 05/14/15  4:09 AM  Result Value Ref Range Status   Specimen Description BLOOD LEFT ANTECUBITAL  Final   Special Requests BOTTLES DRAWN AEROBIC ONLY 5CC  Final   Culture NO GROWTH 2 DAYS  Final   Report Status PENDING  Incomplete  Coagulation Studies: No results for input(s): LABPROT, INR in the last 72 hours.  Urinalysis: No results for input(s): COLORURINE, LABSPEC, PHURINE, GLUCOSEU, HGBUR, BILIRUBINUR, KETONESUR, PROTEINUR, UROBILINOGEN, NITRITE, LEUKOCYTESUR in the last 72 hours.  Invalid input(s): APPERANCEUR    Imaging: Ct Chest High Resolution  05/16/2015   CLINICAL DATA:  74 year old male with history of Sjogren's disease. Evaluate for interstitial lung disease. Three-month follow-up examination.  EXAM: CT CHEST WITHOUT CONTRAST  TECHNIQUE: Multidetector CT imaging of the chest was performed following the standard protocol without intravenous contrast. High resolution imaging of the lungs, as well as inspiratory and expiratory imaging, was performed.  COMPARISON:  Chest CT 01/02/2015.  FINDINGS: Mediastinum/Lymph Nodes: Heart size is borderline enlarged. Small amount of pericardial fluid and/or thickening, unlikely to be of hemodynamic significance at this time. No associated  pericardial calcification. There is atherosclerosis of the thoracic aorta, the great vessels of the mediastinum and the coronary arteries, including calcified atherosclerotic plaque in the left main, left anterior descending, left circumflex and right coronary arteries. Numerous small calcified mediastinal and bilateral hilar lymph nodes. No pathologically enlarged mediastinal or hilar lymph nodes. Please note that accurate exclusion of hilar adenopathy is limited on noncontrast CT scans. Esophagus is unremarkable in appearance. No axillary lymphadenopathy.  Lungs/Pleura: Moderate bilateral pleural effusions lying dependently. These are associated with areas of passive atelectasis in the lower lobes of the lungs bilaterally. Throughout these areas, there is some cylindrical and mild varicose bronchiectasis. There are some scattered areas of septal thickening in the lung bases bilaterally; this is considered highly nonspecific on today's examination given the presence of pleural effusions and associated atelectasis. No frank honeycombing. Mid to upper lungs are otherwise relatively unremarkable in appearance on today's high-resolution images. Inspiratory and expiratory imaging is unremarkable.  Upper Abdomen: Unremarkable.  Musculoskeletal/Soft Tissues: There are no aggressive appearing lytic or blastic lesions noted in the visualized portions of the skeleton.  IMPRESSION: 1. Assessment for interstitial lung disease is severely limited on today's examination due to the presence of moderate bilateral pleural effusions and extensive passive atelectasis in the lung bases bilaterally. While there does appear to be some lower lobe bronchiectasis, and there is some mild septal thickening throughout the basal portions of the lungs bilaterally, this is nonspecific. If there is persistent clinical concern for interstitial lung disease, repeat high-resolution chest CT would be suggested in 12 months. 2. Atherosclerosis,  including left main and 3 vessel coronary artery disease. Assessment for potential risk factor modification, dietary therapy or pharmacologic therapy may be warranted, if clinically indicated. 3. Small amount of pericardial fluid and/or thickening, unlikely to be of any hemodynamic significance at this time.   Electronically Signed   By: Vinnie Langton M.D.   On: 05/16/2015 16:42     Medications:     . antiseptic oral rinse  7 mL Mouth Rinse BID  . aspirin EC  81 mg Oral Daily  . atorvastatin  40 mg Oral q1800  . carvedilol  3.125 mg Oral BID WC  . feeding supplement (NEPRO CARB STEADY)  237 mL Oral TID BM  . feeding supplement (PRO-STAT SUGAR FREE 64)  30 mL Oral BID  . guaiFENesin  600 mg Oral BID  . heparin  5,000 Units Subcutaneous 3 times per day  . hydroxychloroquine  200 mg Oral Daily  . multivitamin  1 tablet Oral QHS  . pantoprazole  40 mg Oral Daily  . piperacillin-tazobactam (ZOSYN)  IV  2.25 g Intravenous Q8H  . sevelamer carbonate  1,600 mg Oral TID WC  . vancomycin  750 mg Intravenous Q M,W,F-HD   hydrOXYzine, metoprolol, white petrolatum  Assessment/ Plan:   ESRD-  MWF dialysis   ANEMIA- controlled  MBD- stable  HTN/VOL- improved with dialysis  s/p IVC filter 02/2015      LOS: 3 Araminta Zorn W @TODAY @10 :41 AM

## 2015-05-17 NOTE — Care Management Note (Signed)
Case Management Note  Patient Details  Name: Travis [name] MRN: [id number] Date of Birth: 06/29/1941  Subjective/Objective:                    Action/Plan:  Case manager contacted patient's wife concerning home health and DME needs. Per Mrs. Ureste, patient is active with [facility]. She requested that patient have an air overlay mattress. Order was entered by MD. Patient lives with wife and son.   Expected Discharge Date:    05/17/15              Expected Discharge Plan:  [place]  In-House Referral:     Discharge planning Services  CM Consult  Post Acute Care Choice:  Durable Medical Equipment, Home Health (resumption of services) Choice offered to:  Spouse  DME Arranged:  Air overlay mattress DME Agency:  [place]:  PT, OT, RN [facility] Agency:  [place]  Status of Service:  Completed, signed off  Medicare Important Message Given:  Yes-second notification given Date Medicare IM Given:    Medicare IM give by:    Date Additional Medicare IM Given:    Additional Medicare Important Message give by:     If discussed at [place] of Stay Meetings, dates discussed:    Additional Comments:  Ninfa Meeker, RN 05/17/2015, 3:27 PM

## 2015-05-17 NOTE — Discharge Summary (Addendum)
DISCHARGE SUMMARY  Travis [name]  MR#: [id number]  DOB:November 19, 1940  Date of Admission: 05/13/2015 Date of Discharge: 05/17/2015  Attending Physician:Lysette Lindenbaum T  Patient's DJ:[id number], MD  Consults: Nephrology   Disposition: D/C home w/ family care   Follow-up Appts:     Follow-up Information    Follow up with [name], MD. Schedule an appointment as soon as possible for a visit in 1 week.   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 male with history of ESRD on hemodialysis, chronic diastolic heart failure, GERD, and dialysis associated hypotension who presented with complaints of the sudden onset of shortness of breath. Did not have any complaints of chest pain nausea vomiting or choking episodes. No fever no chills. No diarrhea no constipation.  Hospital Course:  Acute respiratory distress - acute hypoxic resp failure  Pulm edema appears to have been the etiology - admit weight 71.2kg which appeared close to his prior dry weight, but ongoing unintentional weight loss has likely led to this now being too high - symptoms have essentially resolved with ongoing hemodialysis - dry weight being adjusted per nephrology - weight at d/c 68.9kg  Pulmonary fibrosis Per PCCM note 5/17 patient requires follow-up HRCT - HRCT obtained and is currently without specific evidence of interstitial lung disease though it's utility was limited somewhat by atelectasis and current bronchiectasis as well as pleural effusions - recheck HRCT is suggested in 12 months if concerns for  pulmonary fibrosis persist  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 - Nephrology followed - dialysis ongoing - new dry weight to be established in outpatient follow-up  HD associated Hypotension  BP was stable with dialysis during this admission  Sjogren's disease - Scleroderma  -In May patient was discharged by [facility] outpatient on prednisone 5 mg daily however MAR does not show patient as currently on steroid -Continue Plaquenil 200 mg daily -no convincing evidence of lung involvement per HRCT chest   Acute on chronic diastolic CHF TTE Aug [id number] w/ EF 35-40% - volume management per HD, w/ dry weight decreased this admit   History of DVT  not on anticoagulation due to prior history of renal hematoma - s/p IVC filter 02/2015   Protein-calorie malnutrition, severe Discussed options for appetite stimulant with wife via phone - avoiding Marinol due to likelihood of causing severe confusion in this patient - reluctant to initiate Megace due to relative inactivity and risk of increasing further DVT formation    Medication List    STOP taking these medications        diphenhydrAMINE 25 mg capsule  Commonly known as:  BENADRYL      TAKE these medications        aspirin 81 MG EC tablet  Take 1 tablet (81 mg total) by mouth daily.     atorvastatin 40 MG tablet  Commonly known as:  LIPITOR  Take 1 tablet (40 mg total) by mouth daily at 6 PM.     carvedilol 3.125 MG tablet  Commonly known as:  COREG  Take 1 tablet (3.125 mg total) by mouth 2 (two) times daily with a meal.  cyanocobalamin 500 MCG tablet  Take 1 tablet (500 mcg total) by mouth daily.     feeding supplement (NEPRO CARB STEADY) Liqd  Take 237 mLs by mouth 2 (two) times daily between meals.     feeding supplement (PRO-STAT SUGAR FREE 64) Liqd  Take 30 mLs by mouth 2 (two) times daily.     ferrous sulfate 325 (65 FE) MG tablet  Take 1 tablet (325 mg total) by mouth 2 (two) times daily with  a meal.     guaiFENesin 600 MG 12 hr tablet  Commonly known as:  MUCINEX  Take 1 tablet (600 mg total) by mouth 2 (two) times daily.     hydroxychloroquine 200 MG tablet  Commonly known as:  PLAQUENIL  Take 200 mg by mouth daily.     hydrOXYzine 10 MG tablet  Commonly known as:  ATARAX/VISTARIL  Take 1 tablet (10 mg total) by mouth 3 (three) times daily as needed.     lanolin Oint  Apply 1 application topically as needed (itching dry skin).     multivitamin Tabs tablet  Take 1 tablet by mouth daily.     pantoprazole 40 MG tablet  Commonly known as:  PROTONIX  Take 1 tablet (40 mg total) by mouth daily.     sevelamer carbonate 800 MG tablet  Commonly known as:  RENVELA  Take 2 tablets (1,600 mg total) by mouth 3 (three) times daily with meals.     traMADol-acetaminophen 37.5-325 MG per tablet  Commonly known as:  ULTRACET  Take 1-2 tablets by mouth every 4 (four) hours as needed for severe pain.     triamcinolone cream 0.1 %  Commonly known as:  KENALOG  Apply 1 application topically 2 (two) times daily.     white petrolatum Gel  Commonly known as:  VASELINE  Apply 1 application topically as needed for dry skin.       Day of Discharge BP 138/69 mmHg  Pulse 100  Temp(Src) 98.1 F (36.7 C) (Oral)  Resp 16  Ht 5\' 10"  (1.778 m)  Wt 68.947 kg (152 lb)  BMI 21.81 kg/m2  SpO2 100%  Physical Exam: General: No acute respiratory distress - alert and conversant Lungs: Clear to auscultation bilaterally without wheezes or crackles Cardiovascular: Regular rate and rhythm without murmur gallop or rub normal S1 and S2 Abdomen: Nontender, nondistended, soft, bowel sounds positive, no rebound, no ascites, no appreciable mass Extremities: No significant cyanosis, clubbing, or edema bilateral lower extremities  Basic Metabolic Panel:  Recent Labs Lab 05/14/15 0130 05/14/15 0640 05/15/15 0315 05/16/15 0325 05/17/15 0232  NA 133* 134* 136 133* 132*  K 4.3 4.5 4.6 3.9  3.6  CL 93* 92* 98* 95* 94*  CO2 26 25 25 28 27   GLUCOSE 136* 125* 75 89 96  BUN 17 20 16  24* 10  CREATININE 5.31* 5.20* 3.77* 5.10* 3.45*  CALCIUM 8.8* 8.6* 8.4* 8.4* 8.4*  MG  --   --   --  1.8  --   PHOS  --  7.1*  --   --   --     Liver Function Tests:  Recent Labs Lab 05/14/15 0130 05/14/15 0640 05/15/15 0315 05/16/15 0325 05/17/15 0232  AST 45*  --  41 36 38  ALT 25  --  22 22 24   ALKPHOS 101  --  98 91 92  BILITOT 0.5  --  0.9 0.8 0.8  PROT 7.1  --  6.5 6.6 6.7  ALBUMIN 2.4*  2.3* 2.2* 2.1* 2.1*   Coags:  Recent Labs Lab 05/14/15 0130  INR 1.20   CBC:  Recent Labs Lab 05/14/15 0130 05/15/15 0750 05/16/15 0325 05/16/15 1747 05/17/15 0232  WBC 13.4* 9.6 10.8* 11.0* 11.4*  NEUTROABS 11.3*  --  8.5*  --   --   HGB 11.4* 12.1* 10.2* 12.1* 11.4*  HCT 36.8* 39.5 33.4* 38.2* 36.4*  MCV 90.2 91.2 90.0 89.9 91.9  PLT 287 209 232 210 195   Cardiac Enzymes:  Recent Labs Lab 05/14/15 0130  TROPONINI 0.32*   BNP (last 3 results)  Recent Labs  01/06/15 1758 04/18/15 1626 05/14/15 0130  BNP 83.3 >4500.0* >4500.0*    Recent Results (from the past 240 hour(s))  Culture, blood (routine x 2)     Status: None (Preliminary result)   Collection Time: 05/14/15  4:00 AM  Result Value Ref Range Status   Specimen Description BLOOD LEFT WRIST  Final   Special Requests BOTTLES DRAWN AEROBIC ONLY 1CC  Final   Culture NO GROWTH 3 DAYS  Final   Report Status PENDING  Incomplete  Culture, blood (routine x 2)     Status: None (Preliminary result)   Collection Time: 05/14/15  4:09 AM  Result Value Ref Range Status   Specimen Description BLOOD LEFT ANTECUBITAL  Final   Special Requests BOTTLES DRAWN AEROBIC ONLY 5CC  Final   Culture NO GROWTH 3 DAYS  Final   Report Status PENDING  Incomplete      Time spent in discharge (includes decision making & examination of pt): > 35 minutes  05/17/2015, 3:18 PM   Cherene Altes, MD [facility] Office   [phone] Pager [phone]  On-Call/Text Page:      [email or web address]      password [facility]

## 2015-05-17 NOTE — Discharge Instructions (Signed)
Pulmonary Edema  Pulmonary edema (PE) is a condition in which fluid collects in the lungs. This makes it hard to breathe. PE may be a result of the heart not pumping very well or a result of injury.  CAUSES   Coronary artery disease causes blockages in the arteries of the heart. This deprives the heart muscle of oxygen and weakens the muscle. A heart attack is a form of coronary artery disease.  High blood pressure causes the heart muscle to work harder than usual. Over time, the heart muscle may get stiff, and it starts to work less efficiently. It may also fatigue and weaken.  Viral infection of the heart (myocarditis) may weaken the heart muscle.  Metabolic conditions such as thyroid disease, excessive alcohol use, certain vitamin deficiencies, or diabetes may also weaken the heart muscle.  Leaky or stiff heart valves may impair normal heart function.  Lung disease may strain the heart muscle.  Excessive demands on the heart such as too much salt or fluid intake.  Failure to take prescribed medicines.  Lung injury from heat or toxins, such as poisonous gas.  Infection in the lungs or other parts of the body.  Fluid overload caused by kidney failure or medicines. SYMPTOMS   Shortness of breath at rest or with exertion.  Grunting, wheezing, or gurgling while breathing.  Feeling like you cannot get enough air.  Breaths are shallow and fast.  A lot of coughing with frothy or bloody mucus.  Skin may become cool, damp, and turn a pale or bluish color. DIAGNOSIS  Initial diagnosis may be based on your history, symptoms, and a physical examination. Additional tests for PE may include:  Electrocardiography.  Chest X-ray.  Blood tests.  Stress test.  Ultrasound evaluation of the heart (echocardiography).  Evaluation by a heart doctor (cardiologist).  Test of the heart arteries to look for blockages (angiography).  Check of blood oxygen. TREATMENT  Treatment of PE  will depend on the underlying cause and will focus first on relieving the symptoms.   Extra oxygen to make breathing easier and assist with removing mucus. This may include breathing treatments or a tube into the lungs and a breathing machine.  Medicine to help the body get rid of extra water, usually through an IV tube.  Medicine to help the heart pump better.  If poor heart function is the cause, treatment may include:  Procedures to open blocked arteries, repair damaged heart valves, or remove some of the damaged heart muscle.  A pacemaker to help the heart pump with less effort. HOME CARE INSTRUCTIONS   Your health care provider will help you determine what type of exercise program may be helpful. It is important to maintain strength and increase it if possible. Pace your activities to avoid shortness of breath or chest pain. Rest for at least 1 hour before and after meals. Cardiac rehabilitation programs are available in some locations.  Eat a heart-healthy diet low in salt, saturated fat, and cholesterol. Ask for help with choices.  Make a list of every medicine, vitamin, or herbal supplement you are taking. Keep the list with you at all times. Show it to your health care provider at every visit and before starting a new medicine. Keep the list up to date.  Ask your health care provider or pharmacist to help you write a plan or schedule so that you know things about each medicine such as:  Why you are taking it.  The possible side effects.  The best time of day to take it.  Foods to take with it or avoid.  When to stop taking it.  Record your hospital or clinic weight. When you get home, compare it to your scale and record your weight. Then, weigh yourself first thing in the morning daily, and record the weights. You should weigh yourself every morning after you urinate and before you eat breakfast. Wear the same amount of clothing each time you weigh yourself. Provide your  health care provider with your weight record. Daily weights are important in the early recognition of excess fluid. Tell your health care provider right away if you have gained 03 lb/1.4 kg in 1 day, 05 lb/2.3 kg in a week, or as directed by your health care provider. Your medicines may need to be adjusted.  Blood pressure monitoring should be done as often as directed. You can get a home blood pressure cuff at your drugstore. Record these values and bring them with you for your clinic visits. Notify your health care provider if you become dizzy or light-headed when standing up.  If you are currently a smoker, it is time to quit. Nicotine makes your heart work harder and is one of the leading causes of cardiac deaths. Do not use nicotine gum or patches before talking to your doctor.  Make a follow-up appointment with your health care provider as directed.  Ask your health care provider for a copy of your latest heart tracing (ECG) and keep a copy with you at all times. SEEK IMMEDIATE MEDICAL CARE IF:   You have severe chest pain, especially if the pain is crushing or pressure-like and spreads to the arms, back, neck, or jaw. THIS IS AN EMERGENCY. Do not wait to see if the pain will go away. Call for local emergency medical help. Do not drive yourself to the hospital.  You have sweating, feel sick to your stomach (nauseous), or are experiencing shortness of breath.  Your weight increases by 03 lb/1.4 kg in 1 day or 05 lb/2.3 kg in a week.  You notice increasing shortness of breath that is unusual for you. This may happen during rest, sleep, or with activity.  You develop chest pain (angina) or pain that is unusual for you.  You notice more swelling in your hands, feet, ankles, or abdomen.  You notice lasting (persistent) dizziness, blurred vision, headache, or unsteadiness.  You begin to cough up bloody mucus (sputum).  You are unable to sleep because it is hard to breathe.  You begin to  feel a "jumping" or "fluttering" sensation (palpitations) in the chest that is unusual for you. MAKE SURE YOU:  Understand these instructions.  Will watch your condition.  Will get help right away if you are not doing well or get worse. Document Released: 11/20/2002 Document Revised: 09/04/2013 Document Reviewed: 05/07/2013 [facility] Patient Information 2015 [place], [place]. This information is not intended to replace advice given to you by your health care provider. Make sure you discuss any questions you have with your health care provider.

## 2015-05-18 NOTE — Care Management Note (Signed)
Case Management Note  Patient Details  Name: Travis [name] MRN: [id number] Date of Birth: [birth year]  Subjective/Objective:   Acute respiratory distress - acute hypoxic resp failure                 Action/Plan: Home Health  Expected Discharge Date:  05/17/2015        Expected Discharge Plan:  [place]  In-House Referral:     Discharge planning Services  CM Consult  Post Acute Care Choice:  Durable Medical Equipment, Home Health (resumption of services) Choice offered to:  Spouse  DME Arranged:  [employer]abin [employer] DME Agency:  [place]:  PT, OT, RN [facility] Agency:  [place]  Status of Service:  Completed, signed off  Medicare Important Message Given:  Yes-second notification given Date Medicare IM Given:    Medicare IM give by:    Date Additional Medicare IM Given:    Additional Medicare Important Message give by:     If discussed at [place] of Stay Meetings, dates discussed:    Additional Comments: 05/17/2015  Received call from [facility] rep and to clarify mattress order for home. NCM contacted attending for updated orders. Faxed orders for gel overlay mattress to [facility]. Attempted to call hospital liaison and phone reached [facility] office. Info on facesheet for office to reach wife, Cipriano Hescock or dtr-Shannon. Jonnie Finner RN CCM Case Mgmt phone [phone]

## 2015-05-19 LAB — CULTURE, BLOOD (ROUTINE X 2)
Culture: NO GROWTH
Culture: NO GROWTH

## 2015-05-22 ENCOUNTER — Telehealth: Payer: Self-pay | Admitting: Cardiology

## 2015-05-22 NOTE — Telephone Encounter (Signed)
This will be a Dr Mare Ferrari letter per consult note from the hospital on 04/28/15, written by Dr Marlou Porch.  Will forward this note to Dr Mare Ferrari and nurse for further review and recommendation of medication question.

## 2015-05-22 NOTE — Telephone Encounter (Signed)
New message      Needing to clarify coreg instructions.  Discharge instructions are not clear

## 2015-05-22 NOTE — Telephone Encounter (Signed)
Last blood pressure 140/80 and heart rate 102 without any Carvedilol and states blood pressure has been elevated  Recommendations given to [facility] and she will call and instruct wife

## 2015-05-22 NOTE — Telephone Encounter (Signed)
His carvedilol was stopped on 04/28/15 at the time of his admission because of hypotension.  It was restarted late in the hospitalization and the patient was discharged on carvedilol 3.125 mg twice a day by Dr. Thereasa Solo.  Unless he is having more problems with low blood pressure that we do not know about, he should continue as discharged on 3.125 mg twice a day.

## 2015-05-26 ENCOUNTER — Emergency Department (HOSPITAL_COMMUNITY)
Admission: EM | Admit: 2015-05-26 | Discharge: 2015-05-26 | Disposition: A | Discharge status: Home / Self Care | Payer: Medicare Other | Attending: Emergency Medicine | Admitting: Emergency Medicine

## 2015-05-26 ENCOUNTER — Emergency Department (HOSPITAL_COMMUNITY): Payer: Medicare Other

## 2015-05-26 ENCOUNTER — Encounter (HOSPITAL_COMMUNITY): Payer: Self-pay | Admitting: Emergency Medicine

## 2015-05-26 DIAGNOSIS — Z8701 Personal history of pneumonia (recurrent): Secondary | ICD-10-CM | POA: Diagnosis not present

## 2015-05-26 DIAGNOSIS — Z79899 Other long term (current) drug therapy: Secondary | ICD-10-CM | POA: Diagnosis not present

## 2015-05-26 DIAGNOSIS — I5033 Acute on chronic diastolic (congestive) heart failure: Secondary | ICD-10-CM | POA: Diagnosis not present

## 2015-05-26 DIAGNOSIS — Z992 Dependence on renal dialysis: Secondary | ICD-10-CM | POA: Diagnosis not present

## 2015-05-26 DIAGNOSIS — I120 Hypertensive chronic kidney disease with stage 5 chronic kidney disease or end stage renal disease: Secondary | ICD-10-CM | POA: Insufficient documentation

## 2015-05-26 DIAGNOSIS — N186 End stage renal disease: Secondary | ICD-10-CM | POA: Diagnosis not present

## 2015-05-26 DIAGNOSIS — M1990 Unspecified osteoarthritis, unspecified site: Secondary | ICD-10-CM | POA: Insufficient documentation

## 2015-05-26 DIAGNOSIS — R55 Syncope and collapse: Secondary | ICD-10-CM | POA: Insufficient documentation

## 2015-05-26 DIAGNOSIS — Z7982 Long term (current) use of aspirin: Secondary | ICD-10-CM | POA: Diagnosis not present

## 2015-05-26 DIAGNOSIS — D849 Immunodeficiency, unspecified: Secondary | ICD-10-CM | POA: Insufficient documentation

## 2015-05-26 DIAGNOSIS — Z8709 Personal history of other diseases of the respiratory system: Secondary | ICD-10-CM | POA: Insufficient documentation

## 2015-05-26 DIAGNOSIS — R42 Dizziness and giddiness: Secondary | ICD-10-CM | POA: Insufficient documentation

## 2015-05-26 DIAGNOSIS — K219 Gastro-esophageal reflux disease without esophagitis: Secondary | ICD-10-CM | POA: Diagnosis not present

## 2015-05-26 LAB — CBG MONITORING, ED: GLUCOSE-CAPILLARY: 105 mg/dL — AB (ref 65–99)

## 2015-05-26 LAB — CBC
HCT: 35.1 % — ABNORMAL LOW (ref 39.0–52.0)
Hemoglobin: 10.6 g/dL — ABNORMAL LOW (ref 13.0–17.0)
MCH: 28 pg (ref 26.0–34.0)
MCHC: 30.2 g/dL (ref 30.0–36.0)
MCV: 92.6 fL (ref 78.0–100.0)
Platelets: 207 10*3/uL (ref 150–400)
RBC: 3.79 MIL/uL — AB (ref 4.22–5.81)
RDW: 19.8 % — AB (ref 11.5–15.5)
WBC: 11.6 10*3/uL — AB (ref 4.0–10.5)

## 2015-05-26 LAB — BASIC METABOLIC PANEL
Anion gap: 7 (ref 5–15)
CO2: 31 mmol/L (ref 22–32)
CREATININE: 2.84 mg/dL — AB (ref 0.61–1.24)
Calcium: 7.9 mg/dL — ABNORMAL LOW (ref 8.9–10.3)
Chloride: 95 mmol/L — ABNORMAL LOW (ref 101–111)
GFR, EST AFRICAN AMERICAN: 24 mL/min — AB (ref >60)
GFR, EST NON AFRICAN AMERICAN: 21 mL/min — AB (ref >60)
Glucose, Bld: 107 mg/dL — ABNORMAL HIGH (ref 65–99)
Potassium: 4.1 mmol/L (ref 3.5–5.1)
SODIUM: 133 mmol/L — AB (ref 135–145)

## 2015-05-26 NOTE — ED Notes (Signed)
CBG 105 

## 2015-05-26 NOTE — Discharge Instructions (Signed)
Near-Syncope Near-syncope (commonly known as near fainting) is sudden weakness, dizziness, or feeling like you might pass out. This can happen when getting up or while standing for a long time. It is caused by a sudden decrease in blood flow to the brain, which can occur for various reasons. Most of the reasons are not serious.  HOME CARE Watch your condition for any changes.  Have someone stay with you until you feel stable.  If you feel like you are going to pass out:  Lie down right away.  Prop your feet up if you can.  Breathe deeply and steadily.  Move only when the feeling has gone away. Most of the time, this feeling lasts only a few minutes. You may feel tired for several hours.  Drink enough fluids to keep your pee (urine) clear or pale yellow.  If you are taking blood pressure or heart medicine, stand up slowly.  Follow up with your doctor as told. GET HELP RIGHT AWAY IF:   You have a severe headache.  You have unusual pain in the chest, belly (abdomen), or back.  You have bleeding from the mouth or butt (rectum), or you have black or tarry poop (stool).  You feel your heart beat differently than normal, or you have a very fast pulse.  You pass out, or you twitch and shake when you pass out.  You pass out when sitting or lying down.  You feel confused.  You have trouble walking.  You are weak.  You have vision problems. MAKE SURE YOU:   Understand these instructions.  Will watch your condition.  Will get help right away if you are not doing well or get worse. Document Released: 02/16/2008 Document Revised: 09/04/2013 Document Reviewed: 02/02/2013 [facility] Patient Information 2015 [place], [place]. This information is not intended to replace advice given to you by your health care provider. Make sure you discuss any questions you have with your health care provider.  Return for any new or worse symptoms. Or any recurrent passing out episodes. Continue your  dialysis schedule. Today's workup without any significant abnormalities.

## 2015-05-26 NOTE — ED Notes (Signed)
Pt states he does not make urine, d/c'd protocol urine order.

## 2015-05-26 NOTE — ED Provider Notes (Signed)
CSN: [id number]     Arrival date & time 05/26/15  2102 History   First MD Initiated Contact with Patient 05/26/15 2109     Chief Complaint  Patient presents with  . Near Syncope     (Consider location/radiation/quality/duration/timing/severity/associated sxs/prior Treatment) Patient is a 74 y.o. male presenting with near-syncope. The history is provided by the patient and the EMS personnel.  Near Syncope Pertinent negatives include no chest pain, no abdominal pain and no shortness of breath.   patient is a dialysis patient normally dialyzes Monday Wednesdays and Fridays was dialyzed earlier today. Patient was on call at having a bowel movement in the nearly passed out. Patient did not completely pass out. Patient was feeling fine before that is now feeling fine again. Patient brought in by EMS. Patient denies any fevers chest pain shortness of breath nausea vomiting diarrhea or abdominal pain. Patient now feels fine.  Past Medical History  Diagnosis Date  . Medical history non-contributory   . Arthritis   . Pneumonia   . GERD (gastroesophageal reflux disease)   . Sjogren's disease 01/28/2015  . Pulmonary fibrosis   . 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 Received plasma exchange and steroids but didn't respond and ended up starting hemodialysis June 2016.   Marland Kitchen Acute on chronic diastolic CHF (congestive heart failure) 04/18/2015  . Accelerated hypertension 04/19/2015   Past Surgical History  Procedure Laterality Date  . Hemorroidectomy  1999  . Surgical procedure to remove a mole as a child Right Eye area    At around 65 years old  . Insertion of dialysis catheter N/A 02/17/2015    Procedure: INSERTION OF DIALYSIS CATHETER RIGHT INTERNAL JUGULAR VEIN;  Surgeon: Elam Dutch, MD;  Location: [place];  Service: Vascular;  Laterality: N/A;  . Av fistula placement Right 02/17/2015    Procedure: INSERTION OF RIGHT ARM   ARTERIOVENOUS (AV) GORE-TEX GRAFT ;  Surgeon: Elam Dutch, MD;  Location: [facility] OR;  Service: Vascular;  Laterality: Right;   Family History  Problem Relation Age of Onset  . Hypertension Mother    Social History  Substance Use Topics  . Smoking status: Never Smoker   . Smokeless tobacco: Never Used  . Alcohol Use: No    Review of Systems  Constitutional: Negative for fever.  HENT: Negative for congestion.   Eyes: Negative for visual disturbance.  Respiratory: Negative for shortness of breath.   Cardiovascular: Positive for near-syncope. Negative for chest pain.  Gastrointestinal: Negative for nausea, vomiting, abdominal pain and diarrhea.  Musculoskeletal: Negative for back pain.  Skin: Negative for rash.  Allergic/Immunologic: Positive for immunocompromised state.  Neurological: Positive for light-headedness. Negative for syncope.  Hematological: Does not bruise/bleed easily.      Allergies  Review of patient's allergies indicates no known allergies.  Home Medications   Prior to Admission medications   Medication Sig Start Date End Date Taking? Authorizing Provider  aspirin EC 81 MG EC tablet Take 1 tablet (81 mg total) by mouth daily. 04/23/15  Yes Reyne Dumas, MD  atorvastatin (LIPITOR) 40 MG tablet Take 1 tablet (40 mg total) by mouth daily at 6 PM. 04/23/15  Yes Reyne Dumas, MD  carvedilol (COREG) 3.125 MG tablet Take 1 tablet (3.125 mg total) by mouth 2 (two) times daily with a meal. 04/25/15  Yes Nishant Dhungel, MD  cyanocobalamin 500 MCG tablet Take 1 tablet (500 mcg total)  by mouth daily. 12/26/14  Yes Donne Hazel, MD  ferrous sulfate 325 (65 FE) MG tablet Take 1 tablet (325 mg total) by mouth 2 (two) times daily with a meal. 12/26/14  Yes Donne Hazel, MD  guaiFENesin (MUCINEX) 600 MG 12 hr tablet Take 1 tablet (600 mg total) by mouth 2 (two) times daily. 04/23/15  Yes Reyne Dumas, MD  hydroxychloroquine (PLAQUENIL) 200 MG tablet Take 200 mg by mouth daily.    Yes Historical Provider, MD  hydrOXYzine (ATARAX/VISTARIL) 10 MG tablet Take 1 tablet (10 mg total) by mouth 3 (three) times daily as needed. Patient taking differently: Take 10 mg by mouth 3 (three) times daily as needed for itching.  02/19/15  Yes Kelvin Cellar, MD  multivitamin (RENA-VIT) TABS tablet Take 1 tablet by mouth daily.   Yes Historical Provider, MD  pantoprazole (PROTONIX) 40 MG tablet Take 1 tablet (40 mg total) by mouth daily. 01/31/15  Yes Barton Dubois, MD  sevelamer carbonate (RENVELA) 800 MG tablet Take 2 tablets (1,600 mg total) by mouth 3 (three) times daily with meals. 05/17/15  Yes Cherene Altes, MD  Amino Acids-Protein Hydrolys (FEEDING SUPPLEMENT, PRO-STAT SUGAR FREE 64,) LIQD Take 30 mLs by mouth 2 (two) times daily. 04/25/15   Nishant Dhungel, MD  lanolin OINT Apply 1 application topically as needed (itching dry skin). 04/25/15   Nishant Dhungel, MD  Nutritional Supplements (FEEDING SUPPLEMENT, NEPRO CARB STEADY,) LIQD Take 237 mLs by mouth 2 (two) times daily between meals. 04/23/15   Reyne Dumas, MD  traMADol-acetaminophen (ULTRACET) 37.5-325 MG per tablet Take 1-2 tablets by mouth every 4 (four) hours as needed for severe pain. 04/23/15   Reyne Dumas, MD   BP 109/59 mmHg  Pulse 95  Temp(Src) 98.1 F (36.7 C) (Oral)  Resp 20  Ht 5\' 10"  (1.778 m)  Wt 153 lb (69.4 kg)  BMI 21.95 kg/m2  SpO2 100% Physical Exam  Constitutional: He is oriented to person, place, and time. He appears well-developed and well-nourished. No distress.  HENT:  Head: Normocephalic and atraumatic.  Mouth/Throat: Oropharynx is clear and moist.  Eyes: Conjunctivae and EOM are normal. Pupils are equal, round, and reactive to light.  Neck: Normal range of motion.  Cardiovascular: Normal rate, regular rhythm and normal heart sounds.   Pulmonary/Chest: Effort normal and breath sounds normal. No respiratory distress.  Abdominal: Bowel sounds are normal. There is no tenderness.  Musculoskeletal:  Normal range of motion.  AV fistula right arm with good pulse.  Neurological: He is alert and oriented to person, place, and time. No cranial nerve deficit. He exhibits normal muscle tone. Coordination normal.  Skin: Skin is warm.  Nursing note and vitals reviewed.   ED Course  Procedures (including critical care time) Labs Review Labs Reviewed  BASIC METABOLIC PANEL - Abnormal; Notable for the following:    Sodium 133 (*)    Chloride 95 (*)    Glucose, Bld 107 (*)    BUN <5 (*)    Creatinine, Ser 2.84 (*)    Calcium 7.9 (*)    GFR calc non Af Amer 21 (*)    GFR calc Af Amer 24 (*)    All other components within normal limits  CBC - Abnormal; Notable for the following:    WBC 11.6 (*)    RBC 3.79 (*)    Hemoglobin 10.6 (*)    HCT 35.1 (*)    RDW 19.8 (*)    All other components within normal limits  CBG MONITORING, ED - Abnormal; Notable for the following:    Glucose-Capillary 105 (*)    All other components within normal limits   Results for orders placed or performed during the hospital encounter of [id number]  Basic metabolic panel  Result Value Ref Range   Sodium 133 (L) 135 - 145 mmol/L   Potassium 4.1 3.5 - 5.1 mmol/L   Chloride 95 (L) 101 - 111 mmol/L   CO2 31 22 - 32 mmol/L   Glucose, Bld 107 (H) 65 - 99 mg/dL   BUN <5 (L) 6 - 20 mg/dL   Creatinine, Ser 2.84 (H) 0.61 - 1.24 mg/dL   Calcium 7.9 (L) 8.9 - 10.3 mg/dL   GFR calc non Af Amer 21 (L) >60 mL/min   GFR calc Af Amer 24 (L) >60 mL/min   Anion gap 7 5 - 15  CBC  Result Value Ref Range   WBC 11.6 (H) 4.0 - 10.5 K/uL   RBC 3.79 (L) 4.22 - 5.81 MIL/uL   Hemoglobin 10.6 (L) 13.0 - 17.0 g/dL   HCT 35.1 (L) 39.0 - 52.0 %   MCV 92.6 78.0 - 100.0 fL   MCH 28.0 26.0 - 34.0 pg   MCHC 30.2 30.0 - 36.0 g/dL   RDW 19.8 (H) 11.5 - 15.5 %   Platelets 207 150 - 400 K/uL  CBG monitoring, ED  Result Value Ref Range   Glucose-Capillary 105 (H) 65 - 99 mg/dL     Imaging Review Dg Chest Port 1 View  05/26/2015    CLINICAL DATA:  Chest pain and shortness of breath worsening tonight.  EXAM: PORTABLE CHEST - 1 VIEW  COMPARISON:  05/15/2015 and chest CT 05/16/2015  FINDINGS: Lungs are adequately inflated with significant interval improvement in patient's bilateral pleural effusions and bibasilar atelectasis as there is only minimal hazy residual density in the left base/retrocardiac region. Mild stable cardiomegaly. Remainder of the exam is unchanged.  IMPRESSION: Near complete resolution of the previous seen bilateral pleural effusions and bibasilar atelectasis.  Stable mild cardiomegaly.   Electronically Signed   By: Marin Olp M.D.   On: 05/26/2015 22:36   I have personally reviewed and evaluated these images and lab results as part of my medical decision-making.   EKG Interpretation   Date/Time:  Monday May 26 2015 21:15:56 EDT Ventricular Rate:  98 PR Interval:  161 QRS Duration: 93 QT Interval:  397 QTC Calculation: 507 R Axis:   -15 Text Interpretation:  Sinus rhythm Borderline left axis deviation Repol  abnrm, prob ischemia, anterolateral lds Prolonged QT interval No  significant change since last tracing Confirmed by Brandan Glauber  MD, Kiannah Grunow  [phone]) on 05/26/2015 9:39:44 PM      MDM   Final diagnoses:  Near syncope    Patient with a brief near syncopal episode at home. Patient is a dialysis patient. He is normally dialyzes Monday Wednesdays and Fridays. He was dialyzed earlier today. Patient now feels fine. Patient normally on 2 L of oxygen at home but his oxygen saturations on no oxygen is been fine here. Workup to include chest x-ray and labs without any significant abnormalities. Patient without any fevers patient without any systemic symptoms currently. Cardiac monitoring here showed no arrhythmia. EKG has no acute changes. Patient didn't have any chest pain. Patient is stable for discharge home and follow-up with his doctors and to continue dialysis.    Fredia Sorrow,  MD 05/26/15 [phone]

## 2015-05-26 NOTE — ED Notes (Signed)
Pt arrivees via EMS post vasovagal episode while on the toilet, states he did not completely pass out but began very weak all of a sudden. Alert and oriented, VSS at this time

## 2015-06-10 ENCOUNTER — Encounter: Payer: Self-pay | Admitting: Cardiology

## 2015-06-10 ENCOUNTER — Ambulatory Visit (INDEPENDENT_AMBULATORY_CARE_PROVIDER_SITE_OTHER): Payer: Medicare Other | Admitting: Cardiology

## 2015-06-10 VITALS — BP 130/68 | HR 98 | Ht 70.0 in | Wt 150.0 lb

## 2015-06-10 DIAGNOSIS — I10 Essential (primary) hypertension: Secondary | ICD-10-CM | POA: Diagnosis not present

## 2015-06-10 NOTE — Patient Instructions (Addendum)
Medication Instructions:  Your physician recommends that you continue on your current medications as directed. Please refer to the Current Medication list given to you today.   Labwork: None ordered  Testing/Procedures: None ordered  Follow-Up: Your physician recommends that you schedule a follow-up appointment in: [place]   Any Other Special Instructions Will Be Listed Below (If Applicable).

## 2015-06-10 NOTE — Progress Notes (Signed)
06/10/2015 Travis [name]   [birth year]  [id number]  Primary Physician [name], MD Primary Cardiologist: Dr. Mare Ferrari   Reason for Visit/CC: [facility] f/u for Syncope/ hypotension.   HPI:  The patient is a 74 year old African-American male with a past medical history of ESRD on hemodialysis (MWF), GERD, pulmonary fibrosis, Sjogren's disease, chronic systolic + diastolic dysfunction, hypertension and recent multiple hospital admissions (HCAP 12/2014, DVT [id number] and A/C Systolic+ diastolic CHF exacerbation 04/2015). He was most recently admitted for syncope 04/28/2015. This occurred during HD, in the setting of hypotension with SBP in the 50s. This improved after a bolus of IVFs. We were consulted and decision was made to discontinue his Coreg to prevent recurrent hypotension. No further cardiac w/u was indicated. However, he presented back on 05/14/15 with acute CHF secondary to accelerated HTN. Subsequently, IM decided to restart Coreg at a low dose. Since that time, he has done well. He denies any further syncope at HD. No significant hypotension. BP is currently well controlled at 130/68. No chest pain.   He presents back to clinic today for follow-up. As mention above, he has done well since the last time we assessed him. His BP is stable today. He has no complaints.    Current Outpatient Prescriptions  Medication Sig Dispense Refill  . Amino Acids-Protein Hydrolys (FEEDING SUPPLEMENT, PRO-STAT SUGAR FREE 64,) LIQD Take 30 mLs by mouth 2 (two) times daily. 900 mL 0  . aspirin EC 81 MG EC tablet Take 1 tablet (81 mg total) by mouth daily. 30 tablet 0  . atorvastatin (LIPITOR) 40 MG tablet Take 1 tablet (40 mg total) by mouth daily at 6 PM. 30 tablet 0  . carvedilol (COREG) 3.125 MG tablet Take 1 tablet (3.125 mg total) by mouth 2 (two) times daily with a meal. 60 tablet 0  . cyanocobalamin 500 MCG tablet Take 1 tablet (500 mcg total) by mouth daily. 30 tablet 0  . cyproheptadine  (PERIACTIN) 4 MG tablet Take 4 mg by mouth 3 (three) times daily as needed. (itching)  0  . ferrous sulfate 325 (65 FE) MG tablet Take 1 tablet (325 mg total) by mouth 2 (two) times daily with a meal. 60 tablet 0  . guaiFENesin (MUCINEX) 600 MG 12 hr tablet Take 1 tablet (600 mg total) by mouth 2 (two) times daily. 20 tablet 0  . hydroxychloroquine (PLAQUENIL) 200 MG tablet Take 200 mg by mouth daily.    . hydrOXYzine (ATARAX/VISTARIL) 10 MG tablet Take 1 tablet (10 mg total) by mouth 3 (three) times daily as needed. (Patient taking differently: Take 10 mg by mouth 3 (three) times daily as needed for itching. ) 30 tablet 0  . lanolin OINT Apply 1 application topically as needed (itching dry skin).  0  . multivitamin (RENA-VIT) TABS tablet Take 1 tablet by mouth daily.    . Nutritional Supplements (FEEDING SUPPLEMENT, NEPRO CARB STEADY,) LIQD Take 237 mLs by mouth 2 (two) times daily between meals. 237 mL 0  . pantoprazole (PROTONIX) 40 MG tablet Take 1 tablet (40 mg total) by mouth daily. 30 tablet 1  . sevelamer carbonate (RENVELA) 800 MG tablet Take 2 tablets (1,600 mg total) by mouth 3 (three) times daily with meals. 90 tablet 0  . traMADol-acetaminophen (ULTRACET) 37.5-325 MG per tablet Take 1-2 tablets by mouth every 4 (four) hours as needed for severe pain. 30 tablet 0   No current facility-administered medications for this visit.    No Known Allergies  Social History  Social History  . Marital Status: Married    Spouse Name: N/A  . Number of Children: N/A  . Years of Education: N/A   Occupational History  . Not on file.   Social History Main Topics  . Smoking status: Never Smoker   . Smokeless tobacco: Never Used  . Alcohol Use: No  . Drug Use: No  . Sexual Activity: Not on file   Other Topics Concern  . Not on file   Social History Narrative     Review of Systems: General: negative for chills, fever, night sweats or weight changes.  Cardiovascular: negative for  chest pain, dyspnea on exertion, edema, orthopnea, palpitations, paroxysmal nocturnal dyspnea or shortness of breath Dermatological: negative for rash Respiratory: negative for cough or wheezing Urologic: negative for hematuria Abdominal: negative for nausea, vomiting, diarrhea, bright red blood per rectum, melena, or hematemesis Neurologic: negative for visual changes, syncope, or dizziness All other systems reviewed and are otherwise negative except as noted above.    Blood pressure 130/68, pulse 98, height 5\' 10"  (1.778 m), weight 150 lb (68.04 kg), SpO2 99 %.  General appearance: alert, cooperative and no distress Neck: no carotid bruit and no JVD Lungs: clear to auscultation bilaterally Heart: regular rate and rhythm and 2/6 SM along the LSB Extremities: no LEE Pulses: 2+ and symmetric Skin: warm and dry Neurologic: Grossly normal  EKG not performed   ASSESSMENT AND PLAN:   1. Syncope: occurred in the setting of hypotension during HD. No further recurrence.  2. Hypotension: occurred during HD. Coreg was discontinued on 04/28/15 by Dr. Marlou Porch, but restarted by internal medicine on 05/14/15 due to severe HTN. Will need to continue to monitor BP closely during HD. If this becomes an issue, he may need to hold his antihypertensives until after his HD sessions. His nephrologist can held determine this.   3. ESRD: on HD MWF  4. DVT: s/p IVF 02/2015.   5. Chronic Combined Systolic + Diastolic Dysfunction: stable. Volume controlled through HD.     PLAN  Continue current medications. No changes made today. F/u with Dr. Mare Ferrari in 6-8 weeks for reassessment.   Cabell Lazenby PA-C 06/10/2015 10:20 AM

## 2015-06-11 ENCOUNTER — Other Ambulatory Visit: Payer: Self-pay | Admitting: Cardiology

## 2015-06-11 DIAGNOSIS — R1013 Epigastric pain: Secondary | ICD-10-CM

## 2015-06-11 MED ORDER — OMEPRAZOLE 20 MG PO CPDR: 20.0000 mg | DELAYED_RELEASE_CAPSULE | Freq: Every day | ORAL | Status: DC

## 2015-06-11 NOTE — Telephone Encounter (Signed)
lmtcb

## 2015-06-11 NOTE — Telephone Encounter (Signed)
PT'S  MED LIST  HAS  BEEN  UPDATED  WILL FORWARD TO  DR BRACKBILL ./CY

## 2015-06-11 NOTE — Telephone Encounter (Signed)
New message      Calling to give update on medications

## 2015-06-20 ENCOUNTER — Other Ambulatory Visit: Payer: Self-pay | Admitting: Cardiology

## 2015-06-20 NOTE — Telephone Encounter (Signed)
Per office note 06/10/15 Coreg was discontinued on 04/28/15 by Dr. Marlou Porch, but restarted by internal medicine on 05/14/15 due to severe HTN. Based on this should pcp refill? Please advise. Thanks, MI

## 2015-06-20 NOTE — Telephone Encounter (Signed)
Okay to refill coreg

## 2015-07-15 DIAGNOSIS — K6289 Other specified diseases of anus and rectum: Secondary | ICD-10-CM

## 2015-07-15 HISTORY — DX: Other specified diseases of anus and rectum: K62.89

## 2015-07-28 ENCOUNTER — Emergency Department (HOSPITAL_COMMUNITY): Payer: Medicare Other

## 2015-07-28 ENCOUNTER — Encounter (HOSPITAL_COMMUNITY): Payer: Self-pay

## 2015-07-28 ENCOUNTER — Observation Stay (HOSPITAL_COMMUNITY)
Admission: EM | Admit: 2015-07-28 | Discharge: 2015-08-04 | Disposition: A | Discharge status: Skilled Nursing Facility | Payer: Medicare Other | Attending: Internal Medicine | Admitting: Internal Medicine

## 2015-07-28 DIAGNOSIS — M349 Systemic sclerosis, unspecified: Secondary | ICD-10-CM | POA: Insufficient documentation

## 2015-07-28 DIAGNOSIS — R05 Cough: Secondary | ICD-10-CM | POA: Diagnosis present

## 2015-07-28 DIAGNOSIS — R0602 Shortness of breath: Secondary | ICD-10-CM

## 2015-07-28 DIAGNOSIS — Z79899 Other long term (current) drug therapy: Secondary | ICD-10-CM | POA: Insufficient documentation

## 2015-07-28 DIAGNOSIS — J9610 Chronic respiratory failure, unspecified whether with hypoxia or hypercapnia: Secondary | ICD-10-CM | POA: Diagnosis present

## 2015-07-28 DIAGNOSIS — N2581 Secondary hyperparathyroidism of renal origin: Secondary | ICD-10-CM | POA: Insufficient documentation

## 2015-07-28 DIAGNOSIS — E875 Hyperkalemia: Secondary | ICD-10-CM | POA: Insufficient documentation

## 2015-07-28 DIAGNOSIS — Z86718 Personal history of other venous thrombosis and embolism: Secondary | ICD-10-CM | POA: Insufficient documentation

## 2015-07-28 DIAGNOSIS — K6289 Other specified diseases of anus and rectum: Secondary | ICD-10-CM | POA: Insufficient documentation

## 2015-07-28 DIAGNOSIS — K921 Melena: Principal | ICD-10-CM | POA: Insufficient documentation

## 2015-07-28 DIAGNOSIS — Z515 Encounter for palliative care: Secondary | ICD-10-CM | POA: Insufficient documentation

## 2015-07-28 DIAGNOSIS — Z992 Dependence on renal dialysis: Secondary | ICD-10-CM | POA: Insufficient documentation

## 2015-07-28 DIAGNOSIS — I132 Hypertensive heart and chronic kidney disease with heart failure and with stage 5 chronic kidney disease, or end stage renal disease: Secondary | ICD-10-CM | POA: Insufficient documentation

## 2015-07-28 DIAGNOSIS — R059 Cough, unspecified: Secondary | ICD-10-CM

## 2015-07-28 DIAGNOSIS — Z7982 Long term (current) use of aspirin: Secondary | ICD-10-CM | POA: Insufficient documentation

## 2015-07-28 DIAGNOSIS — K219 Gastro-esophageal reflux disease without esophagitis: Secondary | ICD-10-CM | POA: Insufficient documentation

## 2015-07-28 DIAGNOSIS — K922 Gastrointestinal hemorrhage, unspecified: Secondary | ICD-10-CM | POA: Diagnosis not present

## 2015-07-28 DIAGNOSIS — R627 Adult failure to thrive: Secondary | ICD-10-CM | POA: Insufficient documentation

## 2015-07-28 DIAGNOSIS — R5381 Other malaise: Secondary | ICD-10-CM | POA: Diagnosis present

## 2015-07-28 DIAGNOSIS — Z7401 Bed confinement status: Secondary | ICD-10-CM | POA: Insufficient documentation

## 2015-07-28 DIAGNOSIS — M3500 Sicca syndrome, unspecified: Secondary | ICD-10-CM | POA: Diagnosis present

## 2015-07-28 DIAGNOSIS — J209 Acute bronchitis, unspecified: Secondary | ICD-10-CM | POA: Insufficient documentation

## 2015-07-28 DIAGNOSIS — E43 Unspecified severe protein-calorie malnutrition: Secondary | ICD-10-CM | POA: Insufficient documentation

## 2015-07-28 DIAGNOSIS — D696 Thrombocytopenia, unspecified: Secondary | ICD-10-CM | POA: Insufficient documentation

## 2015-07-28 DIAGNOSIS — Z9981 Dependence on supplemental oxygen: Secondary | ICD-10-CM | POA: Insufficient documentation

## 2015-07-28 DIAGNOSIS — Z681 Body mass index (BMI) 19 or less, adult: Secondary | ICD-10-CM | POA: Insufficient documentation

## 2015-07-28 DIAGNOSIS — K5641 Fecal impaction: Secondary | ICD-10-CM | POA: Diagnosis present

## 2015-07-28 DIAGNOSIS — M3502 Sicca syndrome with lung involvement: Secondary | ICD-10-CM | POA: Insufficient documentation

## 2015-07-28 DIAGNOSIS — I5042 Chronic combined systolic (congestive) and diastolic (congestive) heart failure: Secondary | ICD-10-CM | POA: Insufficient documentation

## 2015-07-28 DIAGNOSIS — M311 Thrombotic microangiopathy: Secondary | ICD-10-CM | POA: Insufficient documentation

## 2015-07-28 DIAGNOSIS — J8410 Pulmonary fibrosis, unspecified: Secondary | ICD-10-CM | POA: Insufficient documentation

## 2015-07-28 DIAGNOSIS — K625 Hemorrhage of anus and rectum: Secondary | ICD-10-CM | POA: Diagnosis present

## 2015-07-28 DIAGNOSIS — Z66 Do not resuscitate: Secondary | ICD-10-CM | POA: Insufficient documentation

## 2015-07-28 DIAGNOSIS — D649 Anemia, unspecified: Secondary | ICD-10-CM | POA: Insufficient documentation

## 2015-07-28 DIAGNOSIS — N186 End stage renal disease: Secondary | ICD-10-CM | POA: Diagnosis not present

## 2015-07-28 DIAGNOSIS — M1990 Unspecified osteoarthritis, unspecified site: Secondary | ICD-10-CM | POA: Insufficient documentation

## 2015-07-28 LAB — CBC WITH DIFFERENTIAL/PLATELET
Basophils Absolute: 0.1 10*3/uL (ref 0.0–0.1)
Basophils Relative: 1 %
EOS ABS: 0.2 10*3/uL (ref 0.0–0.7)
Eosinophils Relative: 2 %
HEMATOCRIT: 39.7 % (ref 39.0–52.0)
HEMOGLOBIN: 11.7 g/dL — AB (ref 13.0–17.0)
LYMPHS ABS: 0.8 10*3/uL (ref 0.7–4.0)
Lymphocytes Relative: 8 %
MCH: 26.9 pg (ref 26.0–34.0)
MCHC: 29.5 g/dL — AB (ref 30.0–36.0)
MCV: 91.3 fL (ref 78.0–100.0)
MONOS PCT: 14 %
Monocytes Absolute: 1.4 10*3/uL — ABNORMAL HIGH (ref 0.1–1.0)
NEUTROS ABS: 7.3 10*3/uL (ref 1.7–7.7)
NEUTROS PCT: 75 %
Platelets: 140 10*3/uL — ABNORMAL LOW (ref 150–400)
RBC: 4.35 MIL/uL (ref 4.22–5.81)
RDW: 18.6 % — ABNORMAL HIGH (ref 11.5–15.5)
WBC: 9.8 10*3/uL (ref 4.0–10.5)

## 2015-07-28 LAB — COMPREHENSIVE METABOLIC PANEL
ALBUMIN: 1.5 g/dL — AB (ref 3.5–5.0)
ALK PHOS: 110 U/L (ref 38–126)
ALT: 17 U/L (ref 17–63)
ANION GAP: 9 (ref 5–15)
AST: 32 U/L (ref 15–41)
BILIRUBIN TOTAL: 0.2 mg/dL — AB (ref 0.3–1.2)
BUN: 21 mg/dL — ABNORMAL HIGH (ref 6–20)
CALCIUM: 8 mg/dL — AB (ref 8.9–10.3)
CO2: 26 mmol/L (ref 22–32)
CREATININE: 6.02 mg/dL — AB (ref 0.61–1.24)
Chloride: 102 mmol/L (ref 101–111)
GFR calc Af Amer: 10 mL/min — ABNORMAL LOW (ref >60)
GFR calc non Af Amer: 8 mL/min — ABNORMAL LOW (ref >60)
GLUCOSE: 87 mg/dL (ref 65–99)
Potassium: 5.3 mmol/L — ABNORMAL HIGH (ref 3.5–5.1)
SODIUM: 137 mmol/L (ref 135–145)
TOTAL PROTEIN: 5.2 g/dL — AB (ref 6.5–8.1)

## 2015-07-28 LAB — PROTIME-INR
INR: 1.42 (ref 0.00–1.49)
Prothrombin Time: 17.4 seconds — ABNORMAL HIGH (ref 11.6–15.2)

## 2015-07-28 LAB — TYPE AND SCREEN
ABO/RH(D): A POS
ANTIBODY SCREEN: NEGATIVE

## 2015-07-28 LAB — LIPASE, BLOOD: Lipase: 30 U/L (ref 11–51)

## 2015-07-28 LAB — POC OCCULT BLOOD, ED: FECAL OCCULT BLD: POSITIVE — AB

## 2015-07-28 LAB — APTT: aPTT: 37 seconds (ref 24–37)

## 2015-07-28 LAB — MRSA PCR SCREENING: MRSA by PCR: NEGATIVE

## 2015-07-28 MED ORDER — GUAIFENESIN ER 600 MG PO TB12: 600.0000 mg | ORAL_TABLET | Freq: Two times a day (BID) | ORAL | Status: DC

## 2015-07-28 MED ORDER — HYDROXYZINE HCL 25 MG PO TABS: 25.0000 mg | ORAL_TABLET | Freq: Every evening | ORAL | Status: DC | PRN

## 2015-07-28 MED ORDER — ONDANSETRON HCL 4 MG PO TABS: 4.0000 mg | ORAL_TABLET | Freq: Four times a day (QID) | ORAL | Status: DC | PRN

## 2015-07-28 MED ORDER — ASPIRIN EC 81 MG PO TBEC
81.0000 mg | DELAYED_RELEASE_TABLET | Freq: Every day | ORAL | Status: DC
  Administered 2015-07-28 – 2015-08-04 (×8): 81 mg via ORAL
  Filled 2015-07-28 (×8): qty 1

## 2015-07-28 MED ORDER — BISACODYL 5 MG PO TBEC
5.0000 mg | DELAYED_RELEASE_TABLET | Freq: Every day | ORAL | Status: DC | PRN
  Administered 2015-08-03: 5 mg via ORAL
  Filled 2015-07-28: qty 1

## 2015-07-28 MED ORDER — RENA-VITE PO TABS
1.0000 | ORAL_TABLET | Freq: Every day | ORAL | Status: DC
  Administered 2015-07-28 – 2015-08-03 (×7): 1 via ORAL
  Filled 2015-07-28 (×7): qty 1

## 2015-07-28 MED ORDER — POLYETHYLENE GLYCOL 3350 17 G PO PACK: 17.0000 g | PACK | Freq: Every day | ORAL | Status: DC | PRN

## 2015-07-28 MED ORDER — SEVELAMER CARBONATE 800 MG PO TABS
1600.0000 mg | ORAL_TABLET | Freq: Three times a day (TID) | ORAL | Status: DC
  Administered 2015-07-28 – 2015-08-04 (×18): 1600 mg via ORAL
  Filled 2015-07-28 (×19): qty 2

## 2015-07-28 MED ORDER — CARVEDILOL 3.125 MG PO TABS
3.1250 mg | ORAL_TABLET | Freq: Two times a day (BID) | ORAL | Status: DC
  Administered 2015-07-28 – 2015-08-04 (×15): 3.125 mg via ORAL
  Filled 2015-07-28 (×15): qty 1

## 2015-07-28 MED ORDER — VITAMIN B-12 1000 MCG PO TABS
500.0000 ug | ORAL_TABLET | Freq: Every day | ORAL | Status: DC
  Administered 2015-07-28 – 2015-08-04 (×8): 500 ug via ORAL
  Filled 2015-07-28 (×8): qty 1

## 2015-07-28 MED ORDER — IOHEXOL 300 MG/ML  SOLN
50.0000 mL | INTRAMUSCULAR | Status: AC
  Administered 2015-07-28: 25 mL via ORAL

## 2015-07-28 MED ORDER — ONDANSETRON HCL 4 MG/2ML IJ SOLN: 4.0000 mg | Freq: Four times a day (QID) | INTRAMUSCULAR | Status: DC | PRN

## 2015-07-28 MED ORDER — HYDROXYCHLOROQUINE SULFATE 200 MG PO TABS
200.0000 mg | ORAL_TABLET | Freq: Every day | ORAL | Status: DC
  Administered 2015-07-28 – 2015-08-04 (×8): 200 mg via ORAL
  Filled 2015-07-28 (×8): qty 1

## 2015-07-28 MED ORDER — PANTOPRAZOLE SODIUM 40 MG PO TBEC: 40.0000 mg | DELAYED_RELEASE_TABLET | Freq: Every day | ORAL | Status: DC

## 2015-07-28 MED ORDER — HYDROCODONE-ACETAMINOPHEN 5-325 MG PO TABS
1.0000 | ORAL_TABLET | ORAL | Status: DC | PRN
  Administered 2015-07-28: 1 via ORAL
  Administered 2015-07-31: 2 via ORAL
  Filled 2015-07-28: qty 1
  Filled 2015-07-28: qty 2

## 2015-07-28 MED ORDER — ACETAMINOPHEN 650 MG RE SUPP: 650.0000 mg | Freq: Four times a day (QID) | RECTAL | Status: DC | PRN

## 2015-07-28 MED ORDER — ACETAMINOPHEN 325 MG PO TABS
650.0000 mg | ORAL_TABLET | Freq: Four times a day (QID) | ORAL | Status: DC | PRN
  Administered 2015-08-02 (×2): 650 mg via ORAL
  Filled 2015-07-28 (×2): qty 2

## 2015-07-28 MED ORDER — ATORVASTATIN CALCIUM 40 MG PO TABS
40.0000 mg | ORAL_TABLET | Freq: Every day | ORAL | Status: DC
  Administered 2015-07-28 – 2015-08-03 (×7): 40 mg via ORAL
  Filled 2015-07-28 (×8): qty 1

## 2015-07-28 NOTE — ED Notes (Signed)
Taken to CT at this time. 

## 2015-07-28 NOTE — H&P (Signed)
[facility] History and Physical  Sumit Torosian [id number] DOB: 1941-04-27 DOA: 07/28/2015  Referring physician: Emergency Department PCP: [name], MD   CHIEF COMPLAINT:  rectal pain and bleeding   HPI: Travis [name] is a 74 y.o. 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  He is on chronic oxygen, 2 L at home.  He was last in late September after having a syncopal episode during dialysis.  Patient presented to the emergency department around midnight with rectal bleeding and rectal pain. Patient takes MiraLAX daily to every other day for chronic constipation. Proximally 3 days ago constipation became severe and associated with rectal pain. Family gave patient 2 enemas without any significant improvement. He's been having frequent, almost constant watery feces coming from the rectum but no solid stool. CT scan today shows stool ball in the rectum with circumferential wall thickening.   No significant weight loss over the last few months but patient complains of progressive weakness. Physical status has recently deteriorated. Not ambulating now uses wheelchair   ED COURSE:     Labs:  Hemoglobin stable, around baseline at 11.7. Normal white count. Platelets low at 140 (baseline low 200). Potassium 5.3, albumin 1.5, INR 1.42   Noncontrast CT scan of the abdomen and pelvis for rectal pain and bleeding-small to moderate pleural effusions, left greater than right, small pericardial effusion. Diverticulosis without diverticulitis. There is a stool ball in the rectum with circumferential wall thickening. Small volume stool throughout the remainder of the colon without wall thickening. Normal appearance of appendix..   Medications  iohexol (OMNIPAQUE) 300 MG/ML solution 50 mL (25 mLs Oral Contrast Given 07/28/15 0139)   Review of Systems  HENT: Negative.   Eyes: Negative.     Respiratory: Negative.   Cardiovascular: Negative.   Gastrointestinal: Positive for constipation and blood in stool.  Genitourinary: Negative.   Musculoskeletal: Negative.   Skin: Negative.   Neurological: Positive for weakness.  Endo/Heme/Allergies: Negative.   Psychiatric/Behavioral: Negative.     Past Medical History  Diagnosis Date  . Medical history non-contributory   . Arthritis   . Pneumonia   . GERD (gastroesophageal reflux disease)   . Sjogren's disease ([place]) 01/28/2015  . Pulmonary fibrosis ([place])   . ESRD on hemodialysis [facility]) June 2016    had severe renal failure May-June 2016 with TMA on biopsy, felt to be idiopathic. Received plasma exchange and steroids but didn't respond and ended up starting hemodialysis June 2016.   Marland Kitchen Acute on chronic diastolic CHF (congestive heart failure) ([place]) 04/18/2015  . Accelerated hypertension 04/19/2015   Past Surgical History  Procedure Laterality Date  . Hemorroidectomy  1999  . Surgical procedure to remove a mole as a child Right Eye area    At around 78 years old  . Insertion of dialysis catheter N/A 02/17/2015    Procedure: INSERTION OF DIALYSIS CATHETER RIGHT INTERNAL JUGULAR VEIN;  Surgeon: Elam Dutch, MD;  Location: [place];  Service: Vascular;  Laterality: N/A;  . Av fistula placement Right 02/17/2015    Procedure: INSERTION OF RIGHT ARM  ARTERIOVENOUS (AV) GORE-TEX GRAFT ;  Surgeon: Elam Dutch, MD;  Location: [place];  Service: Vascular;  Laterality: Right;    SOCIAL HISTORY:  reports that he has never smoked. He has never used smokeless tobacco. He reports that he does not drink alcohol or use illicit drugs. Lives:  At home    With:  wife      Assistive devices:   wheelchair.   No Known Allergies  Family History  Problem Relation Age of Onset  . Hypertension Mother   . Healthy Father   . Hypertension Sister   . Hypertension Brother     Prior to Admission medications   Medication Sig Start Date End Date Taking?  Authorizing Provider  aspirin EC 81 MG EC tablet Take 1 tablet (81 mg total) by mouth daily. 04/23/15   Reyne Dumas, MD  atorvastatin (LIPITOR) 40 MG tablet Take 1 tablet (40 mg total) by mouth daily at 6 PM. 04/23/15   Reyne Dumas, MD  carvedilol (COREG) 3.125 MG tablet TAKE 1 TABLET BY MOUTH 2 TIMES DAILY WITH A MEAL 06/20/15   Darlin Coco, MD  cyanocobalamin 500 MCG tablet Take 1 tablet (500 mcg total) by mouth daily. 12/26/14   Donne Hazel, MD  guaiFENesin (MUCINEX) 600 MG 12 hr tablet Take 1 tablet (600 mg total) by mouth 2 (two) times daily. 04/23/15   Reyne Dumas, MD  hydroxychloroquine (PLAQUENIL) 200 MG tablet Take 200 mg by mouth daily.    Historical Provider, MD  hydrOXYzine (ATARAX/VISTARIL) 10 MG tablet Take 10 mg by mouth 3 (three) times daily as needed for itching.    Historical Provider, MD  lanolin OINT Apply 1 application topically as needed (itching dry skin). 04/25/15   Nishant Dhungel, MD  multivitamin (RENA-VIT) TABS tablet Take 1 tablet by mouth daily.    Historical Provider, MD  omeprazole (PRILOSEC) 20 MG capsule Take 1 capsule (20 mg total) by mouth daily. 06/11/15   Darlin Coco, MD  sevelamer carbonate (RENVELA) 800 MG tablet Take 2 tablets (1,600 mg total) by mouth 3 (three) times daily with meals. 05/17/15   Cherene Altes, MD   PHYSICAL EXAM: Filed Vitals:   07/28/15 0500 07/28/15 0545 07/28/15 0615 07/28/15 0645  BP: 154/78 152/75 155/77 150/76  Pulse: 80     Temp:      TempSrc:      Resp: 14 13 12 9   SpO2: 100%       Wt Readings from Last 3 Encounters:  06/10/15 68.04 kg (150 lb)  05/26/15 69.4 kg (153 lb)  05/17/15 68.947 kg (152 lb)    General:  Pleasant  Black male. Appears calm and comfortable Eyes: PER, normal lids, irises & conjunctiva ENT: grossly normal hearing, lips & tongue Neck: no LAD, no masses Cardiovascular: RRR, 1+BLE  Lungs:  unlabored. Normal respiratory effort. Lungs CTA bilaterally, no wheezes / rales .   Abdomen: soft,  non-distended, non-tender, active bowel sounds. No obvious masses.  Skin: no rash seen on limited exam Musculoskeletal: grossly normal tone BUE/BLE Psychiatric: grossly normal mood and affect, speech fluent and appropriate Neurologic: grossly non-focal.         LABS ON ADMISSION:    Basic Metabolic Panel:  Recent Labs Lab 07/28/15 0144  NA 137  K 5.3*  CL 102  CO2 26  GLUCOSE 87  BUN 21*  CREATININE 6.02*  CALCIUM 8.0*   Liver Function Tests:  Recent Labs Lab 07/28/15 0144  AST 32  ALT 17  ALKPHOS 110  BILITOT 0.2*  PROT 5.2*  ALBUMIN 1.5*    Recent Labs Lab 07/28/15 0144  LIPASE 30    CBC:  Recent Labs Lab 07/28/15 0144  WBC 9.8  NEUTROABS 7.3  HGB 11.7*  HCT 39.7  MCV 91.3  PLT 140*    BNP (last 3 results)  Recent Labs  01/06/15 1758 04/18/15 1626 05/14/15 0130  BNP 83.3 >4500.0* >4500.0*    Radiological Exams on Admission: Ct Abdomen Pelvis Wo Contrast  07/28/2015  CLINICAL DATA:  Rectal pain.  Rectal bleeding for 3 days. EXAM: CT ABDOMEN AND PELVIS WITHOUT CONTRAST TECHNIQUE: Multidetector CT imaging of the abdomen and pelvis was performed following the standard protocol without IV contrast. COMPARISON:  CT 02/07/2015 FINDINGS: Lower chest: Small- moderate pleural effusions, left greater than right with adjacent compressive atelectasis, improved from chest CT of 05/16/2015. Small pericardial effusion. Liver: No evidence of focal lesion allowing for lack contrast. Hepatobiliary: Gallbladder minimally distended, stones and sludge in the fundus. No biliary dilatation. Pancreas: No ductal dilatation or surrounding inflammation. Spleen: Normal. Adrenal glands: No nodule.  Mild thickening on the left. Kidneys: No hydronephrosis or right urolithiasis. Minimal stranding about the left kidney with near complete resolution of previous para renal hemorrhage. Stomach/Bowel: Stomach decompressed. There are no dilated or thickened small bowel loops.  Diverticulosis in the distal colon without diverticulitis. There is a stool ball distending the rectum with circumferential rectal wall thickening. Small volume of stool throughout the remainder of the colon without colonic wall thickening. The appendix is normal. Vascular/Lymphatic: Infrarenal IVC filter in place. No retroperitoneal adenopathy. Abdominal aorta is normal in caliber. Reproductive: Prostate gland appears prominent in size. Bladder: Near completely decompressed. Other: Whole body wall and mild mesenteric edema. Fluid within both spermatic cord. Trace intra-abdominal ascites in the pericolic gutters. No free air. No intra-abdominal fluid collection. Musculoskeletal: There are no acute or suspicious osseous abnormalities. Degenerative change at L4-L5, stable in appearance from prior. Degenerative change involves both sacroiliac joints. IMPRESSION: 1. Circumferential rectal wall thickening with stool ball distending the rectum. Small volume of stool throughout the remainder of the colon. 2. Chronic pleural effusions, small to moderate in degree, left greater than right. This is improved from chest CT of 05/16/2015. 3. Probable gallstones and sludge in the gallbladder. 4. Additional chronic findings as described. Electronically Signed   By: Jeb Levering M.D.   On: 07/28/2015 05:43     ASSESSMENT / PLAN    Rectal bleeding / rectal pain. CTscan reveals fecal impaction / circumferential rectal wall thickening. Having what sounds like overflow diarrhea now. Brown stool on EDP exam this am. Patient may have a stercoral ulcer. He reports unremarkable colonoscopy 3 years ago (sounds like with [facility]).  -admit to observation , tele bed -Will try soap suds enema now. Continue daily miralax, add Dulcolax. I -if no relief from enema will consult Gastroenterology.   ESRD, on HD M,W,F. Due for dialysis today. Left message for [facility].   Hyperkalemia, K+ 5.3. Should improve after dialysis  today.   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 Mild peripheral edema on exam. Small to moderate pleural effusions, improved since September.  TTE Aug 2016 w/ EF 35-40% -continue beta blocker  Chronic respiratory failure in setting of Sjogren's. Prviously evaluated by Pulmonary, interstitial lung disease could not be confirmed.  -continue home 02  Deconditioning. No longer able to ambulate per wife. Now in wheelchair -PT evaluation  GERD, stable.  -continue home PPI  Sjogren's syndrome. -continue home Plaquenil   CONSULTANTS:   Nephrology  Code Status: DNR DVT Prophylaxis:Family Communication:   Patient alert, oriented and understands plan of care.   Disposition Plan: Discharge to home in 24-48 hours   Time spent: 60 minutes Tye Savoy  NP [facility] Pager [phone]

## 2015-07-28 NOTE — Plan of Care (Addendum)
I was called by ER physician Dr. Lita Mains with regarding to patient Travis [name] who presented with rectal bleeding and rectal pain. CT abdomen and pelvis shows stool ball around the rectum and as per ER physician patient is hemodynamically stable and will be admitted for further management of rectal bleeding. Patient is not on any anticoagulants. Patient has known history of ESRD on hemodialysis.  Gean Birchwood

## 2015-07-28 NOTE — ED Notes (Signed)
Per EMS: Pt experiencing rectal bleeding x 3 days. Admits to some suppository use recently. Unknown if trauma related. Pt states bright red blood when going to the bathroom.

## 2015-07-28 NOTE — ED Provider Notes (Signed)
CSN: [id number]     Arrival date & time 07/28/15  0027 History   By signing my name below, I, [name], attest that this documentation has been prepared under the direction and in the presence of Julianne Rice, MD.  Electronically Signed: [name], ED Scribe. 07/28/2015. 1:06 AM.   Chief Complaint  Patient presents with  . Rectal Bleeding   The history is provided by the patient. No language interpreter was used.    HPI Comments: Travis [name] brought in by EMS is a 74 y.o. male with a PMHx of ESRD and HTN who presents to the Emergency Department here for ongoing rectal bleeding with associated pain x 3 days. Blood is described as bright red in color. Pt states blood is consistent but in small amounts. Rectal pain is made worse when having a bowel movements. No alleviating factors at this time. No OTC medications or home remedies attempted prior to arrival. Denies any fever, chills, lightheadedness, dizziness, nausea, diarrhea, abdominal pain, or vomiting. Patient with chronic shortness of breath on 2 L of home oxygen which is unchanged. PCP: [name], MD    Past Medical History  Diagnosis Date  . Medical history non-contributory   . Arthritis   . Pneumonia   . GERD (gastroesophageal reflux disease)   . Sjogren's disease ([place]) 01/28/2015  . Pulmonary fibrosis ([place])   . ESRD on hemodialysis [facility]) June 2016    had severe renal failure May-June 2016 with TMA on biopsy, felt to be idiopathic. Received plasma exchange and steroids but didn't respond and ended up starting hemodialysis June 2016.   Marland Kitchen Acute on chronic diastolic CHF (congestive heart failure) ([place]) 04/18/2015  . Accelerated hypertension 04/19/2015   Past Surgical History  Procedure Laterality Date  . Hemorroidectomy  1999  . Surgical procedure to remove a mole as a child Right Eye area    At around 88 years old  . Insertion of dialysis catheter N/A 02/17/2015    Procedure: INSERTION OF DIALYSIS CATHETER RIGHT  INTERNAL JUGULAR VEIN;  Surgeon: Elam Dutch, MD;  Location: [place];  Service: Vascular;  Laterality: N/A;  . Av fistula placement Right 02/17/2015    Procedure: INSERTION OF RIGHT ARM  ARTERIOVENOUS (AV) GORE-TEX GRAFT ;  Surgeon: Elam Dutch, MD;  Location: [facility];  Service: Vascular;  Laterality: Right;   Family History  Problem Relation Age of Onset  . Hypertension Mother   . Healthy Father   . Hypertension Sister   . Hypertension Brother    Social History  Substance Use Topics  . Smoking status: Never Smoker   . Smokeless tobacco: Never Used  . Alcohol Use: No    Review of Systems  Constitutional: Negative for fever and chills.  Respiratory: Negative for cough and shortness of breath.   Cardiovascular: Negative for chest pain, palpitations and leg swelling.  Gastrointestinal: Positive for blood in stool, anal bleeding and rectal pain. Negative for nausea, vomiting, abdominal pain, diarrhea, constipation and abdominal distention.  Genitourinary: Negative for dysuria.  Musculoskeletal: Negative for back pain.  Skin: Negative for wound.  Neurological: Negative for dizziness, weakness, light-headedness and headaches.  Psychiatric/Behavioral: Negative for confusion.  All other systems reviewed and are negative.     Allergies  Review of patient's allergies indicates no known allergies.  Home Medications   Prior to Admission medications   Medication Sig Start Date End Date Taking? Authorizing Provider  aspirin EC 81 MG EC tablet Take 1 tablet (81 mg total) by mouth  daily. 04/23/15   Reyne Dumas, MD  atorvastatin (LIPITOR) 40 MG tablet Take 1 tablet (40 mg total) by mouth daily at 6 PM. 04/23/15   Reyne Dumas, MD  carvedilol (COREG) 3.125 MG tablet TAKE 1 TABLET BY MOUTH 2 TIMES DAILY WITH A MEAL 06/20/15   Darlin Coco, MD  cyanocobalamin 500 MCG tablet Take 1 tablet (500 mcg total) by mouth daily. 12/26/14   Donne Hazel, MD  guaiFENesin (MUCINEX) 600 MG 12 hr  tablet Take 1 tablet (600 mg total) by mouth 2 (two) times daily. 04/23/15   Reyne Dumas, MD  hydroxychloroquine (PLAQUENIL) 200 MG tablet Take 200 mg by mouth daily.    Historical Provider, MD  hydrOXYzine (ATARAX/VISTARIL) 10 MG tablet Take 10 mg by mouth 3 (three) times daily as needed for itching.    Historical Provider, MD  lanolin OINT Apply 1 application topically as needed (itching dry skin). 04/25/15   Nishant Dhungel, MD  multivitamin (RENA-VIT) TABS tablet Take 1 tablet by mouth daily.    Historical Provider, MD  omeprazole (PRILOSEC) 20 MG capsule Take 1 capsule (20 mg total) by mouth daily. 06/11/15   Darlin Coco, MD  sevelamer carbonate (RENVELA) 800 MG tablet Take 2 tablets (1,600 mg total) by mouth 3 (three) times daily with meals. 05/17/15   Cherene Altes, MD   Triage Vitals: BP 146/81 mmHg  Pulse 81  Temp(Src) 98 F (36.7 C) (Oral)  Resp 12  SpO2 100%   Physical Exam  Constitutional: He is oriented to person, place, and time. He appears well-developed and well-nourished. No distress.  HENT:  Head: Normocephalic and atraumatic.  Mouth/Throat: Oropharynx is clear and moist.  Eyes: EOM are normal. Pupils are equal, round, and reactive to light.  Neck: Normal range of motion. Neck supple.  Cardiovascular: Normal rate and regular rhythm.  Exam reveals no gallop and no friction rub.   No murmur heard. Pulmonary/Chest: Effort normal. No respiratory distress. He has no wheezes. He has rales (scattered rhonchi).  Abdominal: Soft. Bowel sounds are normal. He exhibits no distension and no mass. There is no tenderness. There is no rebound and no guarding.  Genitourinary:  Patient with brown stool in rectum. No masses appreciated or impacted stool. No hemorrhoids or anal fissures appreciated  Musculoskeletal: Normal range of motion. He exhibits no edema or tenderness.  Neurological: He is alert and oriented to person, place, and time.  Skin: Skin is warm and dry. No rash  noted. No erythema.  Psychiatric: He has a normal mood and affect. His behavior is normal.  Nursing note and vitals reviewed.   ED Course  Procedures (including critical care time)  DIAGNOSTIC STUDIES: Oxygen Saturation is 100% on RA, Normal by my interpretation.    COORDINATION OF CARE: 12:54 AM-Discussed treatment plan with pt at bedside and pt agreed to plan.     Labs Review Labs Reviewed  CBC WITH DIFFERENTIAL/PLATELET - Abnormal; Notable for the following:    Hemoglobin 11.7 (*)    MCHC 29.5 (*)    RDW 18.6 (*)    Platelets 140 (*)    Monocytes Absolute 1.4 (*)    All other components within normal limits  COMPREHENSIVE METABOLIC PANEL - Abnormal; Notable for the following:    Potassium 5.3 (*)    BUN 21 (*)    Creatinine, Ser 6.02 (*)    Calcium 8.0 (*)    Total Protein 5.2 (*)    Albumin 1.5 (*)    Total Bilirubin 0.2 (*)  GFR calc non Af Amer 8 (*)    GFR calc Af Amer 10 (*)    All other components within normal limits  PROTIME-INR - Abnormal; Notable for the following:    Prothrombin Time 17.4 (*)    All other components within normal limits  POC OCCULT BLOOD, ED - Abnormal; Notable for the following:    Fecal Occult Bld POSITIVE (*)    All other components within normal limits  LIPASE, BLOOD  APTT  TYPE AND SCREEN    Imaging Review Ct Abdomen Pelvis Wo Contrast  07/28/2015  CLINICAL DATA:  Rectal pain.  Rectal bleeding for 3 days. EXAM: CT ABDOMEN AND PELVIS WITHOUT CONTRAST TECHNIQUE: Multidetector CT imaging of the abdomen and pelvis was performed following the standard protocol without IV contrast. COMPARISON:  CT 02/07/2015 FINDINGS: Lower chest: Small- moderate pleural effusions, left greater than right with adjacent compressive atelectasis, improved from chest CT of 05/16/2015. Small pericardial effusion. Liver: No evidence of focal lesion allowing for lack contrast. Hepatobiliary: Gallbladder minimally distended, stones and sludge in the fundus. No  biliary dilatation. Pancreas: No ductal dilatation or surrounding inflammation. Spleen: Normal. Adrenal glands: No nodule.  Mild thickening on the left. Kidneys: No hydronephrosis or right urolithiasis. Minimal stranding about the left kidney with near complete resolution of previous para renal hemorrhage. Stomach/Bowel: Stomach decompressed. There are no dilated or thickened small bowel loops. Diverticulosis in the distal colon without diverticulitis. There is a stool ball distending the rectum with circumferential rectal wall thickening. Small volume of stool throughout the remainder of the colon without colonic wall thickening. The appendix is normal. Vascular/Lymphatic: Infrarenal IVC filter in place. No retroperitoneal adenopathy. Abdominal aorta is normal in caliber. Reproductive: Prostate gland appears prominent in size. Bladder: Near completely decompressed. Other: Whole body wall and mild mesenteric edema. Fluid within both spermatic cord. Trace intra-abdominal ascites in the pericolic gutters. No free air. No intra-abdominal fluid collection. Musculoskeletal: There are no acute or suspicious osseous abnormalities. Degenerative change at L4-L5, stable in appearance from prior. Degenerative change involves both sacroiliac joints. IMPRESSION: 1. Circumferential rectal wall thickening with stool ball distending the rectum. Small volume of stool throughout the remainder of the colon. 2. Chronic pleural effusions, small to moderate in degree, left greater than right. This is improved from chest CT of 05/16/2015. 3. Probable gallstones and sludge in the gallbladder. 4. Additional chronic findings as described. Electronically Signed   By: Jeb Levering M.D.   On: 07/28/2015 05:43   I have personally reviewed and evaluated these images and lab results as part of my medical decision-making.   EKG Interpretation None      MDM   Final diagnoses:  Lower gastrointestinal bleed    I personally performed  the services described in this documentation, which was scribed in my presence. The recorded information has been reviewed and is accurate.    Patient remains hemodynamically stable. Discussed with [facility] and will admit to telemetry bed.  Julianne Rice, MD 07/28/15 [phone]

## 2015-07-28 NOTE — ED Notes (Signed)
Hospitalist at bedside 

## 2015-07-28 NOTE — Progress Notes (Signed)
MD paged regarding patient's bloody BM. He has had several BMs today since soap suds enema given this AM, but only this one has had that much blood in it. Will continue to monitor.

## 2015-07-29 DIAGNOSIS — K922 Gastrointestinal hemorrhage, unspecified: Secondary | ICD-10-CM | POA: Diagnosis not present

## 2015-07-29 DIAGNOSIS — N186 End stage renal disease: Secondary | ICD-10-CM | POA: Diagnosis not present

## 2015-07-29 DIAGNOSIS — K5641 Fecal impaction: Secondary | ICD-10-CM | POA: Diagnosis not present

## 2015-07-29 DIAGNOSIS — J9611 Chronic respiratory failure with hypoxia: Secondary | ICD-10-CM | POA: Diagnosis not present

## 2015-07-29 LAB — BASIC METABOLIC PANEL
ANION GAP: 7 (ref 5–15)
BUN: 27 mg/dL — ABNORMAL HIGH (ref 6–20)
CALCIUM: 7.9 mg/dL — AB (ref 8.9–10.3)
CO2: 27 mmol/L (ref 22–32)
Chloride: 101 mmol/L (ref 101–111)
Creatinine, Ser: 7.11 mg/dL — ABNORMAL HIGH (ref 0.61–1.24)
GFR, EST AFRICAN AMERICAN: 8 mL/min — AB (ref >60)
GFR, EST NON AFRICAN AMERICAN: 7 mL/min — AB (ref >60)
GLUCOSE: 63 mg/dL — AB (ref 65–99)
POTASSIUM: 5.4 mmol/L — AB (ref 3.5–5.1)
Sodium: 135 mmol/L (ref 135–145)

## 2015-07-29 MED ORDER — GUAIFENESIN-DM 100-10 MG/5ML PO SYRP
5.0000 mL | ORAL_SOLUTION | ORAL | Status: DC | PRN
  Administered 2015-07-29 – 2015-08-03 (×9): 5 mL via ORAL
  Filled 2015-07-29 (×10): qty 5

## 2015-07-29 MED ORDER — MILK AND MOLASSES ENEMA
1.0000 | Freq: Once | RECTAL | Status: AC
  Administered 2015-07-29: 250 mL via RECTAL
  Filled 2015-07-29: qty 250

## 2015-07-29 MED ORDER — PRO-STAT SUGAR FREE PO LIQD
30.0000 mL | Freq: Two times a day (BID) | ORAL | Status: DC
  Administered 2015-07-30 – 2015-08-03 (×10): 30 mL via ORAL
  Filled 2015-07-29 (×11): qty 30

## 2015-07-29 NOTE — Care Management Note (Signed)
Case Management Note  Patient Details  Name: Travis [name] MRN: [id number] Date of Birth: [birth year]  Subjective/Objective:             Date-07-29-15 Initial Assessment Spoke with patient at the bedside along with wife Introduced self as case Freight forwarder and explained role in discharge planning and how to be reached.  Verified patient anticipates to go home with spouse, or SNF at time of discharge.  Patient has DME cane walker  Oxygen at 2L through [facility], and bedside commode. Expressed potential need for no other DME.  Patient denied  needing help with their medication. Gets some meds through [facility] and others through [place].  Verified patient has PCP. Patient states they currently receive [place] services through no one.    Plan: CM will continue to follow for discharge planning and [facility] resources.   Carles Collet RN BSN CM [phone]        Action/Plan:  Requested PT eval from MD.   Expected Discharge Date:                  Expected Discharge Plan:  [place]  In-House Referral:     Discharge planning Services  CM Consult  Post Acute Care Choice:    Choice offered to:     DME Arranged:    DME Agency:     HH Arranged:    [place] Agency:     Status of Service:  In process, will continue to follow  Medicare Important Message Given:    Date Medicare IM Given:    Medicare IM give by:    Date Additional Medicare IM Given:    Additional Medicare Important Message give by:     If discussed at [place] of Stay Meetings, dates discussed:    Additional Comments:  Carles Collet, RN 07/29/2015, 2:33 PM

## 2015-07-29 NOTE — Consult Note (Signed)
[facility] Renal Consultation Note    Indication for Consultation:  Management of ESRD/hemodialysis; anemia, hypertension/volume and secondary hyperparathyroidism PCP:  HPI: Travis [name] is a 74 y.o. male with ESRD on hemodialysis MWF at [facility]. Patient has complicated past medical history which includes hypertension, Sjorgren's disease, pulmonary fibrosis, acute on chronic HF, pneumonia, acute respiratory failure.  He is admitted after 3 days of constipation, rectal pain and rectal bleeding. CT of abdomen shows bilateral pleural effusions L > R (chronic)  Gallbladder shows possible gallstones and sludge, Circumferential rectal wall thickening with stool ball distending the rectum. Being managed per primary. Patient currently denies chest pain, fever, chills, SOB, denies abdominal pain at present.   Patient has HD at [facility], frequently signs off early due to discomfort of sitting in chair although has been doing better in recent weeks.   Past Medical History  Diagnosis Date  . Medical history non-contributory   . Arthritis   . Pneumonia   . GERD (gastroesophageal reflux disease)   . Sjogren's disease ([place]) 01/28/2015  . Pulmonary fibrosis ([place])   . ESRD on hemodialysis [facility]) June 2016    had severe renal failure May-June 2016 with TMA on biopsy, felt to be idiopathic. Received plasma exchange and steroids but didn't respond and ended up starting hemodialysis June 2016.   Marland Kitchen Acute on chronic diastolic CHF (congestive heart failure) ([place]) 04/18/2015  . Accelerated hypertension 04/19/2015   Past Surgical History  Procedure Laterality Date  . Hemorroidectomy  1999  . Surgical procedure to remove a mole as a child Right Eye area    At around 25 years old  . Insertion of dialysis catheter N/A 02/17/2015    Procedure: INSERTION OF DIALYSIS CATHETER RIGHT INTERNAL JUGULAR VEIN;  Surgeon: Elam Dutch, MD;  Location: [place];  Service: Vascular;  Laterality: N/A;  .  Av fistula placement Right 02/17/2015    Procedure: INSERTION OF RIGHT ARM  ARTERIOVENOUS (AV) GORE-TEX GRAFT ;  Surgeon: Elam Dutch, MD;  Location: [facility] OR;  Service: Vascular;  Laterality: Right;   Family History  Problem Relation Age of Onset  . Hypertension Mother   . Healthy Father   . Hypertension Sister   . Hypertension Brother    Social History:  reports that he has never smoked. He has never used smokeless tobacco. He reports that he does not drink alcohol or use illicit drugs. No Known Allergies Prior to Admission medications   Medication Sig Start Date End Date Taking? Authorizing Provider  aspirin EC 81 MG EC tablet Take 1 tablet (81 mg total) by mouth daily. 04/23/15  Yes Reyne Dumas, MD  atorvastatin (LIPITOR) 40 MG tablet Take 1 tablet (40 mg total) by mouth daily at 6 PM. 04/23/15  Yes Reyne Dumas, MD  carvedilol (COREG) 3.125 MG tablet TAKE 1 TABLET BY MOUTH 2 TIMES DAILY WITH A MEAL 06/20/15  Yes Darlin Coco, MD  cyanocobalamin 500 MCG tablet Take 1 tablet (500 mcg total) by mouth daily. 12/26/14  Yes Donne Hazel, MD  hydroxychloroquine (PLAQUENIL) 200 MG tablet Take 200 mg by mouth daily.   Yes Historical Provider, MD  HYDROXYZINE PAMOATE PO Take 25 mg by mouth at bedtime as needed. For sleep   Yes Historical Provider, MD  multivitamin (RENA-VIT) TABS tablet Take 1 tablet by mouth daily.   Yes Historical Provider, MD  sevelamer carbonate (RENVELA) 800 MG tablet Take 2 tablets (1,600 mg total) by mouth 3 (three) times daily with meals. 05/17/15  Yes Cherene Altes, MD   Current Facility-Administered Medications  Medication Dose Route Frequency Provider Last Rate Last Dose  . acetaminophen (TYLENOL) tablet 650 mg  650 mg Oral Q6H PRN Willia Craze, NP       Or  . acetaminophen (TYLENOL) suppository 650 mg  650 mg Rectal Q6H PRN Willia Craze, NP      . aspirin EC tablet 81 mg  81 mg Oral Daily Willia Craze, NP   81 mg at 07/29/15 0830  . atorvastatin  (LIPITOR) tablet 40 mg  40 mg Oral q1800 Willia Craze, NP   40 mg at 07/28/15 1819  . bisacodyl (DULCOLAX) EC tablet 5 mg  5 mg Oral Daily PRN Willia Craze, NP      . carvedilol (COREG) tablet 3.125 mg  3.125 mg Oral BID WC Willia Craze, NP   3.125 mg at 07/29/15 0830  . HYDROcodone-acetaminophen (NORCO/VICODIN) 5-325 MG per tablet 1-2 tablet  1-2 tablet Oral Q4H PRN Willia Craze, NP   1 tablet at 07/28/15 1219  . hydroxychloroquine (PLAQUENIL) tablet 200 mg  200 mg Oral Daily Willia Craze, NP   200 mg at 07/29/15 0830  . hydrOXYzine (ATARAX/VISTARIL) tablet 25 mg  25 mg Oral QHS PRN Willia Craze, NP      . multivitamin (RENA-VIT) tablet 1 tablet  1 tablet Oral QHS Willia Craze, NP   1 tablet at 07/28/15 2200  . ondansetron (ZOFRAN) tablet 4 mg  4 mg Oral Q6H PRN Willia Craze, NP       Or  . ondansetron [facility]) injection 4 mg  4 mg Intravenous Q6H PRN Willia Craze, NP      . polyethylene glycol (MIRALAX / GLYCOLAX) packet 17 g  17 g Oral Daily PRN Willia Craze, NP      . sevelamer carbonate (RENVELA) tablet 1,600 mg  1,600 mg Oral TID WC Willia Craze, NP   1,600 mg at 07/29/15 0857  . vitamin B-12 (CYANOCOBALAMIN) tablet 500 mcg  500 mcg Oral Daily Willia Craze, NP   500 mcg at 07/29/15 0830   Labs: Basic Metabolic Panel:  Recent Labs Lab 07/28/15 0144 07/29/15 0622  NA 137 135  K 5.3* 5.4*  CL 102 101  CO2 26 27  GLUCOSE 87 63*  BUN 21* 27*  CREATININE 6.02* 7.11*  CALCIUM 8.0* 7.9*   Liver Function Tests:  Recent Labs Lab 07/28/15 0144  AST 32  ALT 17  ALKPHOS 110  BILITOT 0.2*  PROT 5.2*  ALBUMIN 1.5*    Recent Labs Lab 07/28/15 0144  LIPASE 30   No results for input(s): AMMONIA in the last 168 hours. CBC:  Recent Labs Lab 07/28/15 0144  WBC 9.8  NEUTROABS 7.3  HGB 11.7*  HCT 39.7  MCV 91.3  PLT 140*   Cardiac Enzymes: No results for input(s): CKTOTAL, CKMB, CKMBINDEX, TROPONINI in the last 168  hours. CBG: No results for input(s): GLUCAP in the last 168 hours. Iron Studies: No results for input(s): IRON, TIBC, TRANSFERRIN, FERRITIN in the last 72 hours. Studies/Results: Ct Abdomen Pelvis Wo Contrast  07/28/2015  CLINICAL DATA:  Rectal pain.  Rectal bleeding for 3 days. EXAM: CT ABDOMEN AND PELVIS WITHOUT CONTRAST TECHNIQUE: Multidetector CT imaging of the abdomen and pelvis was performed following the standard protocol without IV contrast. COMPARISON:  CT 02/07/2015 FINDINGS: Lower chest: Small- moderate pleural effusions, left greater than right with adjacent compressive  atelectasis, improved from chest CT of 05/16/2015. Small pericardial effusion. Liver: No evidence of focal lesion allowing for lack contrast. Hepatobiliary: Gallbladder minimally distended, stones and sludge in the fundus. No biliary dilatation. Pancreas: No ductal dilatation or surrounding inflammation. Spleen: Normal. Adrenal glands: No nodule.  Mild thickening on the left. Kidneys: No hydronephrosis or right urolithiasis. Minimal stranding about the left kidney with near complete resolution of previous para renal hemorrhage. Stomach/Bowel: Stomach decompressed. There are no dilated or thickened small bowel loops. Diverticulosis in the distal colon without diverticulitis. There is a stool ball distending the rectum with circumferential rectal wall thickening. Small volume of stool throughout the remainder of the colon without colonic wall thickening. The appendix is normal. Vascular/Lymphatic: Infrarenal IVC filter in place. No retroperitoneal adenopathy. Abdominal aorta is normal in caliber. Reproductive: Prostate gland appears prominent in size. Bladder: Near completely decompressed. Other: Whole body wall and mild mesenteric edema. Fluid within both spermatic cord. Trace intra-abdominal ascites in the pericolic gutters. No free air. No intra-abdominal fluid collection. Musculoskeletal: There are no acute or suspicious osseous  abnormalities. Degenerative change at L4-L5, stable in appearance from prior. Degenerative change involves both sacroiliac joints. IMPRESSION: 1. Circumferential rectal wall thickening with stool ball distending the rectum. Small volume of stool throughout the remainder of the colon. 2. Chronic pleural effusions, small to moderate in degree, left greater than right. This is improved from chest CT of 05/16/2015. 3. Probable gallstones and sludge in the gallbladder. 4. Additional chronic findings as described. Electronically Signed   By: Jeb Levering M.D.   On: 07/28/2015 05:43    ROS: As per HPI otherwise negative.   Physical Exam: Filed Vitals:   07/29/15 0915 07/29/15 0952 07/29/15 1007 07/29/15 1022  BP: 133/71 115/70 108/66 98/61  Pulse: 81 81 84 82  Temp: 97.9 F (36.6 C)     TempSrc: Oral     Resp: 25     Height:      Weight: 86.9 kg (191 lb 9.3 oz)     SpO2:         General: Chronically ill, thin appearing male in NAD Head: Normocephalic, atraumatic, sclera non-icteric, mucus membranes are moist Neck: Supple. JVD not elevated. Lungs: Clear bilaterally to auscultation without wheezes, rales, or rhonchi. Breathing is unlabored. Heart: RRR with S1 S2. II/VI mid systolic M.  Abdomen: Soft, non-tender, non-distended with normoactive bowel sounds.  Lower extremities:without edema or ischemic changes, no open wounds  Neuro: Alert and oriented X 3. Moves all extremities spontaneously. Psych:  Responds to questions flat affect. Is cooperative but withdrawn.  Dialysis Access: RFA AVG + thrill + Bruit  Dialysis Orders:  [facility], MonWedFri, 4 hrs 15 min, Optiflux 200NRe, BFR 500, DFR Manual 800 mL/min, EDW 113.5 (kg), Dialysate 2.0 K, 2.0 Ca, UFR Profile: None, Sodium Model: None, Access: AV Fistula Heparin 6000 units per treatment Mircera 75 mcg (Last dose 07/16/15) Venofer 50 mg IV weekly (dose to start this week-recent Fe load in HD unit)   Assessment/Plan: 1.   Hematochezia/proctitis/constipation:  Impacted. Per primary.  2.  ESRD -  MWF at [facility]. Will have HD today off schedule due to missed treatment yesterday. HD again tomorrow to get back on schedule.  3.  Hypertension/volume  - Has been restarted on coreg. No BP meds on HD center list. For HD today, attempt 1-2 liters.  4.  Anemia  - 11.7. Will start ESA therapy if still in hospital at end of week.  5.  Metabolic bone disease -  Continue Renvela as ordered. Ca 7.9 C Ca 9.9  6.  Nutrition - Albumin 1.5.Renal diet.  Start Prostat, Nepro vits. Renal diet.  7.    Sjorgren's disease:  Cont. Hydroxychloroquine.  Rita H. Owens Shark, NP-C 07/29/2015, 10:36 AM  [facility] Beeper [phone]  Pt seen, examined and agree w A/P as above.  Kelly Splinter MD [employer] pager [phone]    cell [phone] 07/29/2015, 1:25 PM

## 2015-07-29 NOTE — Progress Notes (Signed)
Pt arrived to unit by bed at 0915.  A & O X 4, lungs clear to auscultation.  No edema.  RLAVG accessed, blood return appreciated.  Tx initiated at [phone].  Goal of 2100 with net removal of 1L.  Will continue to monitor.

## 2015-07-29 NOTE — Progress Notes (Signed)
Tx completed at 1307.  2100 mL ultrafiltrated, net 1L.  Pt status unchanged.  Needles removed and pressure held 10 minutes.  Report called.

## 2015-07-29 NOTE — Care Management Obs Status (Signed)
[place] NOTIFICATION   Patient Details  Name: Travis [name] MRN: [id number] Date of Birth: [birth year]   Medicare Observation Status Notification Given:  Yes    Carles Collet, RN 07/29/2015, 2:47 PM

## 2015-07-29 NOTE — Consult Note (Signed)
Referring Provider:  Orson Eva, MD Primary Care Physician:  [name], MD Primary Gastroenterologist: Althia Forts  Reason for Consultation:  Fecal impaction  HPI: Travis [name] is a 74 y.o. male admitted to the hospital earlier today with a several day history of rectal pain, bleeding, and radiographic evidence on CT scan of a fecal impaction. I reviewed the CT and it does appear that there is quite a bit of stool in the rectum with fair amount of air mixed in, as well as a small to moderate amount stool throughout the remainder the colon, no overt problem with fecal obstipation. The patient is a bedridden dialysis patient who lives at home and is incontinent of stool. He indicates that he does not move his bowels every day and that the stool tends to be hard in character, although he does not seem to be a particularly clear or reliable historian. He denies abdominal pain. He indicates he does use something to help his bowels move, although his medication list does not indicate that he is on MiraLAX or other such products. On chart review, there is no prior history of colonoscopic evaluation that I can find. He is Hemoccult positive on admission but his hemoglobin is basically stable from baseline.   Past Medical History  Diagnosis Date  . Medical history non-contributory   . Arthritis   . Pneumonia   . GERD (gastroesophageal reflux disease)   . Sjogren's disease ([place]) 01/28/2015  . Pulmonary fibrosis ([place])   . ESRD on hemodialysis [facility]) June 2016    had severe renal failure May-June 2016 with TMA on biopsy, felt to be idiopathic. Received plasma exchange and steroids but didn't respond and ended up starting hemodialysis June 2016.   Marland Kitchen Acute on chronic diastolic CHF (congestive heart failure) ([place]) 04/18/2015  . Accelerated hypertension 04/19/2015    Past Surgical History  Procedure Laterality Date  . Hemorroidectomy  1999  . Surgical procedure to remove a mole as a child Right Eye area     At around 40 years old  . Insertion of dialysis catheter N/A 02/17/2015    Procedure: INSERTION OF DIALYSIS CATHETER RIGHT INTERNAL JUGULAR VEIN;  Surgeon: Elam Dutch, MD;  Location: [place];  Service: Vascular;  Laterality: N/A;  . Av fistula placement Right 02/17/2015    Procedure: INSERTION OF RIGHT ARM  ARTERIOVENOUS (AV) GORE-TEX GRAFT ;  Surgeon: Elam Dutch, MD;  Location: [facility] OR;  Service: Vascular;  Laterality: Right;    Prior to Admission medications   Medication Sig Start Date End Date Taking? Authorizing Provider  aspirin EC 81 MG EC tablet Take 1 tablet (81 mg total) by mouth daily. 04/23/15  Yes Reyne Dumas, MD  atorvastatin (LIPITOR) 40 MG tablet Take 1 tablet (40 mg total) by mouth daily at 6 PM. 04/23/15  Yes Reyne Dumas, MD  carvedilol (COREG) 3.125 MG tablet TAKE 1 TABLET BY MOUTH 2 TIMES DAILY WITH A MEAL 06/20/15  Yes Darlin Coco, MD  cyanocobalamin 500 MCG tablet Take 1 tablet (500 mcg total) by mouth daily. 12/26/14  Yes Donne Hazel, MD  hydroxychloroquine (PLAQUENIL) 200 MG tablet Take 200 mg by mouth daily.   Yes Historical Provider, MD  HYDROXYZINE PAMOATE PO Take 25 mg by mouth at bedtime as needed. For sleep   Yes Historical Provider, MD  multivitamin (RENA-VIT) TABS tablet Take 1 tablet by mouth daily.   Yes Historical Provider, MD  sevelamer carbonate (RENVELA) 800 MG tablet Take 2 tablets (1,600 mg total) by  mouth 3 (three) times daily with meals. 05/17/15  Yes Cherene Altes, MD    Current Facility-Administered Medications  Medication Dose Route Frequency Provider Last Rate Last Dose  . acetaminophen (TYLENOL) tablet 650 mg  650 mg Oral Q6H PRN Willia Craze, NP       Or  . acetaminophen (TYLENOL) suppository 650 mg  650 mg Rectal Q6H PRN Willia Craze, NP      . aspirin EC tablet 81 mg  81 mg Oral Daily Willia Craze, NP   81 mg at 07/29/15 0830  . atorvastatin (LIPITOR) tablet 40 mg  40 mg Oral q1800 Willia Craze, NP   40 mg at  07/29/15 1821  . bisacodyl (DULCOLAX) EC tablet 5 mg  5 mg Oral Daily PRN Willia Craze, NP      . carvedilol (COREG) tablet 3.125 mg  3.125 mg Oral BID WC Willia Craze, NP   3.125 mg at 07/29/15 1821  . feeding supplement (PRO-STAT SUGAR FREE 64) liquid 30 mL  30 mL Oral BID Valentina Gu, NP      . guaiFENesin-dextromethorphan (ROBITUSSIN DM) 100-10 MG/5ML syrup 5 mL  5 mL Oral Q4H PRN Gardiner Barefoot, NP      . HYDROcodone-acetaminophen (NORCO/VICODIN) 5-325 MG per tablet 1-2 tablet  1-2 tablet Oral Q4H PRN Willia Craze, NP   1 tablet at 07/28/15 1219  . hydroxychloroquine (PLAQUENIL) tablet 200 mg  200 mg Oral Daily Willia Craze, NP   200 mg at 07/29/15 0830  . hydrOXYzine (ATARAX/VISTARIL) tablet 25 mg  25 mg Oral QHS PRN Willia Craze, NP      . multivitamin (RENA-VIT) tablet 1 tablet  1 tablet Oral QHS Willia Craze, NP   1 tablet at 07/28/15 2200  . ondansetron (ZOFRAN) tablet 4 mg  4 mg Oral Q6H PRN Willia Craze, NP       Or  . ondansetron [facility]) injection 4 mg  4 mg Intravenous Q6H PRN Willia Craze, NP      . polyethylene glycol (MIRALAX / GLYCOLAX) packet 17 g  17 g Oral Daily PRN Willia Craze, NP      . sevelamer carbonate (RENVELA) tablet 1,600 mg  1,600 mg Oral TID WC Willia Craze, NP   1,600 mg at 07/29/15 1821  . vitamin B-12 (CYANOCOBALAMIN) tablet 500 mcg  500 mcg Oral Daily Willia Craze, NP   500 mcg at 07/29/15 0830    Allergies as of 07/28/2015  . (No Known Allergies)    Family History  Problem Relation Age of Onset  . Hypertension Mother   . Healthy Father   . Hypertension Sister   . Hypertension Brother     Social History   Social History  . Marital Status: Married    Spouse Name: N/A  . Number of Children: N/A  . Years of Education: N/A   Occupational History  . Not on file.   Social History Main Topics  . Smoking status: Never Smoker   . Smokeless tobacco: Never Used  . Alcohol Use: No  . Drug  Use: No  . Sexual Activity: Not on file   Other Topics Concern  . Not on file   Social History Narrative    Review of Systems: See history of present illness  Physical Exam: Vital signs in last 24 hours: Temp:  [97.5 F (36.4 C)-98.3 F (36.8 C)] 97.5 F (36.4 C) (11/15 1358) Pulse  Rate:  [74-84] 84 (11/15 1358) Resp:  [16-25] 20 (11/15 1358) BP: (98-150)/(61-83) 137/81 mmHg (11/15 1358) SpO2:  [98 %-100 %] 98 % (11/15 1358) Weight:  [85.3 kg (188 lb 0.8 oz)-86.9 kg (191 lb 9.3 oz)] 85.3 kg (188 lb 0.8 oz) (11/15 1307) Last BM Date: 07/28/15 This is a chronically ill-appearing but pleasant, alert African-American male lying in bed in no distress. He is anicteric and without frank pallor. The skin is warm and dry. Chest is clear anteriorly, with perhaps slightly coarse breath sounds but no wheezes. Heart has regular rhythm, I did not appreciate a murmur or gallop. The abdomen is nondistended, in fact is scaphoid in configuration, without organomegaly, palpable stool, mass effect, or tenderness. Rectal exam shows no evident perianal disease, no prolapsed hemorrhoids, normal sphincter tone without anal stenosis, and a capacious rectal ampulla with very soft, almost liquid chocolate brown stool with perhaps a very slight rust colored tint but no frank blood, no mucus. I do not palpate any rectal masses and there is no palpable fecal impaction distally.  Intake/Output from previous day: 11/14 0701 - 11/15 0700 In: 702 [P.O.:702] Out: -  Intake/Output this shift:    Lab Results:  Recent Labs  07/28/15 0144  WBC 9.8  HGB 11.7*  HCT 39.7  PLT 140*   BMET  Recent Labs  07/28/15 0144 07/29/15 0622  NA 137 135  K 5.3* 5.4*  CL 102 101  CO2 26 27  GLUCOSE 87 63*  BUN 21* 27*  CREATININE 6.02* 7.11*  CALCIUM 8.0* 7.9*   LFT  Recent Labs  07/28/15 0144  PROT 5.2*  ALBUMIN 1.5*  AST 32  ALT 17  ALKPHOS 110  BILITOT 0.2*   PT/INR  Recent Labs  07/28/15 0144   LABPROT 17.4*  INR 1.42    Studies/Results: Ct Abdomen Pelvis Wo Contrast  07/28/2015  CLINICAL DATA:  Rectal pain.  Rectal bleeding for 3 days. EXAM: CT ABDOMEN AND PELVIS WITHOUT CONTRAST TECHNIQUE: Multidetector CT imaging of the abdomen and pelvis was performed following the standard protocol without IV contrast. COMPARISON:  CT 02/07/2015 FINDINGS: Lower chest: Small- moderate pleural effusions, left greater than right with adjacent compressive atelectasis, improved from chest CT of 05/16/2015. Small pericardial effusion. Liver: No evidence of focal lesion allowing for lack contrast. Hepatobiliary: Gallbladder minimally distended, stones and sludge in the fundus. No biliary dilatation. Pancreas: No ductal dilatation or surrounding inflammation. Spleen: Normal. Adrenal glands: No nodule.  Mild thickening on the left. Kidneys: No hydronephrosis or right urolithiasis. Minimal stranding about the left kidney with near complete resolution of previous para renal hemorrhage. Stomach/Bowel: Stomach decompressed. There are no dilated or thickened small bowel loops. Diverticulosis in the distal colon without diverticulitis. There is a stool ball distending the rectum with circumferential rectal wall thickening. Small volume of stool throughout the remainder of the colon without colonic wall thickening. The appendix is normal. Vascular/Lymphatic: Infrarenal IVC filter in place. No retroperitoneal adenopathy. Abdominal aorta is normal in caliber. Reproductive: Prostate gland appears prominent in size. Bladder: Near completely decompressed. Other: Whole body wall and mild mesenteric edema. Fluid within both spermatic cord. Trace intra-abdominal ascites in the pericolic gutters. No free air. No intra-abdominal fluid collection. Musculoskeletal: There are no acute or suspicious osseous abnormalities. Degenerative change at L4-L5, stable in appearance from prior. Degenerative change involves both sacroiliac joints.  IMPRESSION: 1. Circumferential rectal wall thickening with stool ball distending the rectum. Small volume of stool throughout the remainder of the colon. 2.  Chronic pleural effusions, small to moderate in degree, left greater than right. This is improved from chest CT of 05/16/2015. 3. Probable gallstones and sludge in the gallbladder. 4. Additional chronic findings as described. Electronically Signed   By: Jeb Levering M.D.   On: 07/28/2015 05:43    Impression: 1. Chronic constipation with current possible fecal obstipation 2. Reported history of rectal bleeding,? Possible stercoral ulceration  Plan: 1. Milk and molasses enema tonight, tapwater enema in the morning. 2. Consider possible sigmoidoscopic evaluation in a day or 2, once his stool is reasonably well cleared out, to look for evidence of a stercoral ulceration and to help exclude mass lesions or proctitis. 3. Initiate MiraLAX twice daily. This might be helpful for the patient to remain on at home, with the dose titrated to a bowel movement roughly once a day.   LOS: 1 day   K[place]ana Sanon V  07/29/2015, 8:53 PM   Pager [phone] If no answer or after 5 PM call [phone]

## 2015-07-29 NOTE — Progress Notes (Signed)
PROGRESS NOTE  Travis [name] [id number] DOB: 10-07-1940 DOA: 07/28/2015 PCP: [name], MD  Brief History 74 year old male with a history of ESRD (MWF), systolic and diastolic CHF, hypertension, Sjogren syndrome, chronic respiratory failure on 2 L presented with three-day history of rectal bleeding, constipation, and rectal pain. The patient had given himself enemas 2 at home. He had some watery stool without any solid stool. Soapsuds enema was given after admission without significant improvement. CT of the abdomen and pelvis after admission revealed a stool ball in rectum with rectal wall thickening. Hgb has remained stable.  GI and nephrology were consulted.  Notably, the patient was recently discharged from the hospital on 05/17/2015 after treatment for acute on chronic respiratory failure secondary to volume overload.   Assessment/Plan: Hematochezia/Proctitis -07/28/2015 CT abdomen pelvis--stool ball and rectum with rectal wall thickening, moderate bilateral pleural effusions improved versus 05/16/2015 -Soapsuds enema given after admission--very little effect -may need SMOG vs gastrograffin enema vs endoscopy -consulted GI -baseline Hgb 11-12  Pulmonary fibrosis/Chronic respiratory failure -Per PCCM note 5/17 patient requires follow-up HRCT - HRCT obtained 05/16/15 and is currently without specific evidence of interstitial lung disease though it's utility was limited somewhat by atelectasis and current bronchiectasis as well as pleural effusions - recheck HRCT is suggested in 12 months if concerns for pulmonary fibrosis persist -on 2L at home--presently stable on 2L  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  -Nephrology following - dialysis ongoing - new dry weight to be established in outpatient follow-up  HD associated Hypotension  -BP was stable with dialysis during this admission  Sjogren's disease - Scleroderma  -In May 2016 patient was  discharged by [facility] outpatient on prednisone 5 mg daily however MAR does not show patient as currently on steroid -Continue Plaquenil 200 mg daily -no convincing evidence of lung involvement per HRCT chest   Chronic systolic and diastolic CHF -clinically compensated -TTE 04/20/15-- w/ EF 35-40% - volume management per HD  History of DVT  -not on anticoagulation due to prior history of renal hematoma - s/p IVC filter 02/2015   Protein-calorie malnutrition, severe -avoiding Marinol due to likelihood of causing severe confusion in this patient  -reluctant to initiate Megace due to relative risk of increasing further DVT formation  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  Rectal bleeding for 3 days. EXAM: CT ABDOMEN AND PELVIS WITHOUT CONTRAST TECHNIQUE: Multidetector CT imaging of the abdomen and pelvis was performed following the standard protocol without IV contrast. COMPARISON:  CT 02/07/2015 FINDINGS: Lower chest: Small- moderate pleural effusions, left greater than right with adjacent compressive atelectasis, improved from chest CT of 05/16/2015. Small pericardial effusion. Liver: No evidence of focal lesion allowing for lack contrast. Hepatobiliary: Gallbladder minimally distended, stones and sludge in the fundus. No biliary dilatation. Pancreas: No ductal dilatation or surrounding inflammation. Spleen: Normal. Adrenal glands: No nodule.  Mild thickening on the left. Kidneys: No hydronephrosis or right urolithiasis. Minimal stranding about the left kidney with near complete resolution of previous para renal hemorrhage. Stomach/Bowel:  Stomach decompressed. There are no dilated or thickened small bowel  loops. Diverticulosis in the distal colon without diverticulitis. There is a stool ball distending the rectum with circumferential rectal wall thickening. Small volume of stool throughout the remainder of the colon without colonic wall thickening. The appendix is normal. Vascular/Lymphatic: Infrarenal IVC filter in place. No retroperitoneal adenopathy. Abdominal aorta is normal in caliber. Reproductive: Prostate gland appears prominent in size. Bladder: Near completely decompressed. Other: Whole body wall and mild mesenteric edema. Fluid within both spermatic cord. Trace intra-abdominal ascites in the pericolic gutters. No free air. No intra-abdominal fluid collection. Musculoskeletal: There are no acute or suspicious osseous abnormalities. Degenerative change at L4-L5, stable in appearance from prior. Degenerative change involves both sacroiliac joints. IMPRESSION: 1. Circumferential rectal wall thickening with stool ball distending the rectum. Small volume of stool throughout the remainder of the colon. 2. Chronic pleural effusions, small to moderate in degree, left greater than right. This is improved from chest CT of 05/16/2015. 3. Probable gallstones and sludge in the gallbladder. 4. Additional chronic findings as described. Electronically Signed   By: Jeb Levering M.D.   On: 07/28/2015 05:43         Subjective: Patient denies fevers, chills, headache, chest pain, dyspnea, nausea, vomiting, diarrhea, abdominal pain, dysuria, hematuria.  Still having episodes of rectal bleeding. Still only having small amounts of liquid stool. Denies any headache, visual disturbance, dizziness.   Objective: Filed Vitals:   07/29/15 0952 07/29/15 1007 07/29/15 1022 07/29/15 1037  BP: 115/70 108/66 98/61 116/67  Pulse: 81 84 82 81  Temp:      TempSrc:      Resp:      Height:      Weight:      SpO2:        Intake/Output Summary (Last 24  hours) at 07/29/15 1057 Last data filed at 07/28/15 1825  Gross per 24 hour  Intake    480 ml  Output      0 ml  Net    480 ml   Weight change:  Exam:   General:  Pt is alert, follows commands appropriately, not in acute distress  HEENT: No icterus, No thrush, No neck mass, [place]/AT  Cardiovascular: RRR, S1/S2, no rubs, no gallops  Respiratory: CTA bilaterally, no wheezing, no crackles, no rhonchi; diminished breath sounds bilateral bases   Abdomen: Soft/+BS, non tender, non distended, no guarding; no hepatosplenomegaly   Extremities: No edema, No lymphangitis, No petechiae, No rashes, no synovitis  Data Reviewed: Basic Metabolic Panel:  Recent Labs Lab 07/28/15 0144 07/29/15 0622  NA 137 135  K 5.3* 5.4*  CL 102 101  CO2 26 27  GLUCOSE 87 63*  BUN 21* 27*  CREATININE 6.02* 7.11*  CALCIUM 8.0* 7.9*   Liver Function Tests:  Recent Labs Lab 07/28/15 0144  AST 32  ALT 17  ALKPHOS 110  BILITOT 0.2*  PROT 5.2*  ALBUMIN 1.5*    Recent Labs Lab 07/28/15 0144  LIPASE 30   No results for input(s): AMMONIA in the last 168 hours. CBC:  Recent Labs Lab 07/28/15 0144  WBC 9.8  NEUTROABS 7.3  HGB 11.7*  HCT 39.7  MCV 91.3  PLT 140*   Cardiac Enzymes: No results for input(s): CKTOTAL, CKMB, CKMBINDEX, TROPONINI in the last 168 hours. BNP: Invalid input(s): POCBNP CBG: No results for input(s): GLUCAP in the last 168 hours.  Recent Results (from the past 240 hour(s))  MRSA PCR Screening     Status: None   Collection Time: 07/28/15  7:38 PM  Result Value Ref Range Status   MRSA by PCR NEGATIVE NEGATIVE Final    Comment:        The GeneXpert MRSA Assay (FDA approved for NASAL specimens only), is one component of a comprehensive MRSA colonization surveillance program. It is not intended to diagnose MRSA infection nor to guide or monitor treatment for MRSA infections.      Scheduled Meds: . aspirin EC  81 mg Oral Daily  . atorvastatin  40 mg  Oral q1800  . carvedilol  3.125 mg Oral BID WC  . hydroxychloroquine  200 mg Oral Daily  . multivitamin  1 tablet Oral QHS  . sevelamer carbonate  1,600 mg Oral TID WC  . cyanocobalamin  500 mcg Oral Daily   Continuous Infusions:    Bria Sparr, DO  [facility] Pager [phone]  If 7PM-7AM, please contact night-coverage [email or web address] Password [email or web address] 07/29/2015, 10:57 AM   LOS: 1 day

## 2015-07-29 NOTE — Procedures (Signed)
  I was present at this dialysis session, have reviewed the session itself and made  appropriate changes Kelly Splinter MD [place] pager [phone]    cell [phone] 07/29/2015, 1:26 PM

## 2015-07-30 DIAGNOSIS — K922 Gastrointestinal hemorrhage, unspecified: Secondary | ICD-10-CM | POA: Diagnosis not present

## 2015-07-30 DIAGNOSIS — K5641 Fecal impaction: Secondary | ICD-10-CM

## 2015-07-30 DIAGNOSIS — N186 End stage renal disease: Secondary | ICD-10-CM

## 2015-07-30 DIAGNOSIS — J9611 Chronic respiratory failure with hypoxia: Secondary | ICD-10-CM | POA: Diagnosis not present

## 2015-07-30 DIAGNOSIS — J189 Pneumonia, unspecified organism: Secondary | ICD-10-CM | POA: Diagnosis not present

## 2015-07-30 DIAGNOSIS — Z992 Dependence on renal dialysis: Secondary | ICD-10-CM | POA: Diagnosis not present

## 2015-07-30 DIAGNOSIS — R05 Cough: Secondary | ICD-10-CM | POA: Diagnosis not present

## 2015-07-30 DIAGNOSIS — K625 Hemorrhage of anus and rectum: Secondary | ICD-10-CM | POA: Diagnosis not present

## 2015-07-30 DIAGNOSIS — R5381 Other malaise: Secondary | ICD-10-CM

## 2015-07-30 LAB — CBC
HEMATOCRIT: 36.2 % — AB (ref 39.0–52.0)
HEMATOCRIT: 36.6 % — AB (ref 39.0–52.0)
Hemoglobin: 11 g/dL — ABNORMAL LOW (ref 13.0–17.0)
Hemoglobin: 11.1 g/dL — ABNORMAL LOW (ref 13.0–17.0)
MCH: 27.6 pg (ref 26.0–34.0)
MCH: 27.5 pg (ref 26.0–34.0)
MCHC: 30.4 g/dL (ref 30.0–36.0)
MCHC: 30.3 g/dL (ref 30.0–36.0)
MCV: 91 fL (ref 78.0–100.0)
MCV: 90.8 fL (ref 78.0–100.0)
PLATELETS: 163 10*3/uL (ref 150–400)
Platelets: 164 10*3/uL (ref 150–400)
RBC: 3.98 MIL/uL — ABNORMAL LOW (ref 4.22–5.81)
RBC: 4.03 MIL/uL — ABNORMAL LOW (ref 4.22–5.81)
RDW: 18.3 % — AB (ref 11.5–15.5)
RDW: 18.4 % — AB (ref 11.5–15.5)
WBC: 9.2 10*3/uL (ref 4.0–10.5)
WBC: 9.5 10*3/uL (ref 4.0–10.5)

## 2015-07-30 LAB — BASIC METABOLIC PANEL
Anion gap: 7 (ref 5–15)
BUN: 14 mg/dL (ref 6–20)
CALCIUM: 8 mg/dL — AB (ref 8.9–10.3)
CO2: 31 mmol/L (ref 22–32)
CREATININE: 4.99 mg/dL — AB (ref 0.61–1.24)
Chloride: 101 mmol/L (ref 101–111)
GFR calc Af Amer: 12 mL/min — ABNORMAL LOW (ref >60)
GFR calc non Af Amer: 10 mL/min — ABNORMAL LOW (ref >60)
GLUCOSE: 84 mg/dL (ref 65–99)
Potassium: 4.6 mmol/L (ref 3.5–5.1)
Sodium: 139 mmol/L (ref 135–145)

## 2015-07-30 LAB — RENAL FUNCTION PANEL
Albumin: 1.4 g/dL — ABNORMAL LOW (ref 3.5–5.0)
Anion gap: 6 (ref 5–15)
BUN: 15 mg/dL (ref 6–20)
CHLORIDE: 100 mmol/L — AB (ref 101–111)
CO2: 29 mmol/L (ref 22–32)
Calcium: 7.8 mg/dL — ABNORMAL LOW (ref 8.9–10.3)
Creatinine, Ser: 5.05 mg/dL — ABNORMAL HIGH (ref 0.61–1.24)
GFR calc Af Amer: 12 mL/min — ABNORMAL LOW (ref >60)
GFR calc non Af Amer: 10 mL/min — ABNORMAL LOW (ref >60)
GLUCOSE: 79 mg/dL (ref 65–99)
POTASSIUM: 4.4 mmol/L (ref 3.5–5.1)
Phosphorus: 3.6 mg/dL (ref 2.5–4.6)
Sodium: 135 mmol/L (ref 135–145)

## 2015-07-30 MED ORDER — LIDOCAINE HCL (PF) 1 % IJ SOLN: 5.0000 mL | INTRAMUSCULAR | Status: DC | PRN

## 2015-07-30 MED ORDER — HEPARIN SODIUM (PORCINE) 1000 UNIT/ML DIALYSIS
20.0000 [IU]/kg | INTRAMUSCULAR | Status: DC | PRN
  Filled 2015-07-30: qty 2

## 2015-07-30 MED ORDER — DARBEPOETIN ALFA 60 MCG/0.3ML IJ SOSY: 60.0000 ug | PREFILLED_SYRINGE | INTRAMUSCULAR | Status: DC

## 2015-07-30 MED ORDER — PENTAFLUOROPROP-TETRAFLUOROETH EX AERO: 1.0000 "application " | INHALATION_SPRAY | CUTANEOUS | Status: DC | PRN

## 2015-07-30 MED ORDER — SODIUM CHLORIDE 0.9 % IV SOLN: 100.0000 mL | INTRAVENOUS | Status: DC | PRN

## 2015-07-30 MED ORDER — ALTEPLASE 2 MG IJ SOLR
2.0000 mg | Freq: Once | INTRAMUSCULAR | Status: DC | PRN
  Filled 2015-07-30: qty 2

## 2015-07-30 MED ORDER — LIDOCAINE-PRILOCAINE 2.5-2.5 % EX CREA: 1.0000 "application " | TOPICAL_CREAM | CUTANEOUS | Status: DC | PRN

## 2015-07-30 MED ORDER — HEPARIN SODIUM (PORCINE) 1000 UNIT/ML DIALYSIS: 1000.0000 [IU] | INTRAMUSCULAR | Status: DC | PRN

## 2015-07-30 MED ORDER — ALTEPLASE 2 MG IJ SOLR: 2.0000 mg | Freq: Once | INTRAMUSCULAR | Status: DC | PRN

## 2015-07-30 MED ORDER — HEPARIN SODIUM (PORCINE) 1000 UNIT/ML DIALYSIS
1000.0000 [IU] | INTRAMUSCULAR | Status: DC | PRN
  Filled 2015-07-30: qty 1

## 2015-07-30 NOTE — Progress Notes (Signed)
PT Cancellation Note  Patient Details Name: Travis [name] MRN: [id number] DOB: 29-Sep-1940   Cancelled Treatment:    Reason Eval/Treat Not Completed: Patient at procedure or test/unavailable.  Pt in ?HD all afternoon.  Will see as able 11/17. 07/30/2015  Donnella Sham, [place] [phone]  (pager)   Deanna Wiater, Tessie Fass 07/30/2015, 5:43 PM

## 2015-07-30 NOTE — Progress Notes (Signed)
[facility] Progress Note  Assessment/Plan: 1. Hematochezia/proctitis/constipation: Impacted. Per primary. GI is following. For possible sigmoidoscopic evaluation later this week to R/O stercoral ulceration , mass lesions, proctitis. 2. ESRD - MWF at [facility]. Had HD yesterday, will have short HD today.  3. Hypertension/volume - Has been restarted on coreg. HD yesterday Net UF 1000. Will have short HD today to get back on schedule. No edema/SOB.  4. Anemia - 11.0 today. Follow CBC. Will start ESA therapy if still in hospital at end of week.  5. Metabolic bone disease - Continue Renvela as ordered. Ca 7.9 C Ca 9.9  6. Nutrition - Albumin 1.5.Renal diet. Start Prostat, Nepro vits. Renal diet. 7. Sjorgren's disease: Cont. Hydroxychloroquine.   Rita H. Brown NP-C 07/30/2015, 11:40 AM  [facility] [phone]  Pt seen, examined and agree w A/P as above.  Kelly Splinter MD [facility] pager [phone]    cell [phone] 07/30/2015, 12:43 PM    Subjective:  "I feel a little better today". Wife at bedside, had enema last night, unable to say for certain if he had results from enema. No C/O at present.   Objective Filed Vitals:   07/29/15 1310 07/29/15 1358 07/29/15 2103 07/30/15 0629  BP: 150/76 137/81 127/67 130/68  Pulse: 76 84 85 89  Temp:  97.5 F (36.4 C) 98.3 F (36.8 C) 98.4 F (36.9 C)  TempSrc:  Oral Oral Oral  Resp:  20 20 20   Height:      Weight:      SpO2:  98% 100% 98%   Physical Exam General: Thin, chronically ill appearing male in NAD Heart: S1, S2, II/VI M.  Lungs: bilateral breath sounds CTA Abdomen: Soft, non-tender, non-distended with normoactive bowel sounds Extremities: No LE edema Dialysis Access: RFA AVG + thrill + Bruit  Dialysis Orders: [facility], MonWedFri, 4 hrs 15 min, Optiflux 200NRe, BFR 500, DFR Manual 800 mL/min, EDW 113.5 (kg), Dialysate 2.0 K, 2.0 Ca, UFR Profile: None, Sodium Model: None,  Access: AV Fistula Heparin 6000 units per treatment Mircera 75 mcg (Last dose 07/16/15) Venofer 50 mg IV weekly (dose to start this week-recent Fe load in HD unit)   Labs: Basic Metabolic Panel:  Recent Labs Lab 07/28/15 0144 07/29/15 0622 07/30/15 0517  NA 137 135 139  K 5.3* 5.4* 4.6  CL 102 101 101  CO2 26 27 31   GLUCOSE 87 63* 84  BUN 21* 27* 14  CREATININE 6.02* 7.11* 4.99*  CALCIUM 8.0* 7.9* 8.0*   Liver Function Tests:  Recent Labs Lab 07/28/15 0144  AST 32  ALT 17  ALKPHOS 110  BILITOT 0.2*  PROT 5.2*  ALBUMIN 1.5*    Recent Labs Lab 07/28/15 0144  LIPASE 30   CBC:  Recent Labs Lab 07/28/15 0144 07/30/15 0517  WBC 9.8 9.2  NEUTROABS 7.3  --   HGB 11.7* 11.0*  HCT 39.7 36.2*  MCV 91.3 91.0  PLT 140* 163   Blood Culture    Component Value Date/Time   SDES BLOOD LEFT ANTECUBITAL 05/14/2015 0409   SPECREQUEST BOTTLES DRAWN AEROBIC ONLY 5CC 05/14/2015 0409   CULT NO GROWTH 5 DAYS 05/14/2015 0409   REPTSTATUS 05/19/2015 FINAL 05/14/2015 0409    Cardiac Enzymes: No results for input(s): CKTOTAL, CKMB, CKMBINDEX, TROPONINI in the last 168 hours. CBG: No results for input(s): GLUCAP in the last 168 hours. Iron Studies: No results for input(s): IRON, TIBC, TRANSFERRIN, FERRITIN in the last 72 hours. @lablastinr3 @ Studies/Results: No results found. Medications:   .  aspirin EC  81 mg Oral Daily  . atorvastatin  40 mg Oral q1800  . carvedilol  3.125 mg Oral BID WC  . feeding supplement (PRO-STAT SUGAR FREE 64)  30 mL Oral BID  . hydroxychloroquine  200 mg Oral Daily  . multivitamin  1 tablet Oral QHS  . sevelamer carbonate  1,600 mg Oral TID WC  . cyanocobalamin  500 mcg Oral Daily

## 2015-07-30 NOTE — Progress Notes (Signed)
[facility] Progress Note  Subjective: The patient underwent a milk and molasses enema last night according to him with no results. Second tap water enema pending.  Objective: Vital signs in last 24 hours: Temp:  [97.5 F (36.4 C)-98.4 F (36.9 C)] 98.4 F (36.9 C) (11/16 0629) Pulse Rate:  [74-89] 89 (11/16 0629) Resp:  [20-23] 20 (11/16 0629) BP: (127-150)/(67-83) 130/68 mmHg (11/16 0629) SpO2:  [98 %-100 %] 98 % (11/16 0629) Weight:  [85.3 kg (188 lb 0.8 oz)] 85.3 kg (188 lb 0.8 oz) (11/15 1307) Weight change: 31 kg (68 lb 5.5 oz)   PE: Unchanged  Lab Results: Results for orders placed or performed during the hospital encounter of 07/28/15 (from the past 24 hour(s))  CBC     Status: Abnormal   Collection Time: 07/30/15  5:17 AM  Result Value Ref Range   WBC 9.2 4.0 - 10.5 K/uL   RBC 3.98 (L) 4.22 - 5.81 MIL/uL   Hemoglobin 11.0 (L) 13.0 - 17.0 g/dL   HCT 36.2 (L) 39.0 - 52.0 %   MCV 91.0 78.0 - 100.0 fL   MCH 27.6 26.0 - 34.0 pg   MCHC 30.4 30.0 - 36.0 g/dL   RDW 18.3 (H) 11.5 - 15.5 %   Platelets 163 150 - 400 K/uL  Basic metabolic panel     Status: Abnormal   Collection Time: 07/30/15  5:17 AM  Result Value Ref Range   Sodium 139 135 - 145 mmol/L   Potassium 4.6 3.5 - 5.1 mmol/L   Chloride 101 101 - 111 mmol/L   CO2 31 22 - 32 mmol/L   Glucose, Bld 84 65 - 99 mg/dL   BUN 14 6 - 20 mg/dL   Creatinine, Ser 4.99 (H) 0.61 - 1.24 mg/dL   Calcium 8.0 (L) 8.9 - 10.3 mg/dL   GFR calc non Af Amer 10 (L) >60 mL/min   GFR calc Af Amer 12 (L) >60 mL/min   Anion gap 7 5 - 15    Studies/Results: No results found.    Assessment: 1. Fecal impaction 2. Rectal bleeding probably secondary to hemorrhoids or stercoral ulcer  Plan: 1. Continue Mira lax 2. Second enema scheduled today. 3. Will obtain KUB tomorrow.    Madelline Eshbach C 07/30/2015, 11:50 AM  Pager [phone] If no answer or after 5 PM call [phone]

## 2015-07-30 NOTE — Progress Notes (Signed)
[name]  Travis [name] [id number] DOB: [birth year] DOA: 07/28/2015 PCP: [name], MD   Brief narrative 74 year old male with a history of ESRD (MWF), systolic and diastolic CHF, recent hospitalization in 05/2015 for acute on chronic respiratory failure secondary to volume overload, hypertension, Sjogren syndrome, chronic respiratory failure on 2 L presented with three-day history of rectal bleeding, constipation, and rectal pain. The patient had given himself enemas 2 at home. He had some watery stool without any solid stool. Soapsuds enema was given after admission without significant improvement. CT of the abdomen and pelvis after admission revealed a stool ball in rectum with rectal wall thickening. Hgb has remained stable. GI and nephrology were consulted.      Assessment/Plan: Hematochezia/Proctitis with severe consitpation -07/28/2015 CT abdomen pelvis--stool ball and rectum with rectal wall thickening, moderate bilateral pleural effusions improved versus 05/16/2015 -Soapsuds enema given without effect. Seen by [facility] and ordered molasses enema last night without much effect either. Ordered repeat tap water enema this morning. Placed on scheduled MiraLAX twice a day. GI plan on sigmoidoscopy evaluation once bowel cleared to look for evidence of stercoral ulceration and  possible proctitis. -baseline Hgb 11-12  Pulmonary fibrosis/Chronic respiratory failure -Per PCCM note from 5/17 patient requires follow-up HRCT. HRCT obtained 05/16/15 and is currently without specific evidence of interstitial lung disease though it's utility was limited somewhat by atelectasis and current bronchiectasis as well as pleural effusions  - repeat HTCT is suggested in 12 months if concerns for pulmonary fibrosis persist. -on 2L at home. Stable at present.  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 On scheduled HD  HD  associated Hypotension  -BP was stable with dialysis during this admission  Sjogren's disease - Scleroderma  -In May 2016 patient was discharged by [facility] outpatient on prednisone 5 mg daily however MAR does not show patient as currently being on steroid -Continue Plaquenil 200 mg daily -no convincing evidence of lung involvement per HRCT chest .  Chronic systolic and diastolic CHF - compensated -TTE 04/20/15-- w/ EF 35-40% - volume management per HD  History of DVT  -not on anticoagulation due to prior history of renal hematoma - s/p IVC filter 02/2015   Protein-calorie malnutrition, severe -avoiding Marinol due to likelihood of causing severe confusion in this patient. 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 Reports having some bowel movement yesterday but none since enema received.  Objective: Filed Vitals:   07/30/15 1340  BP: 136/80  Pulse: 79  Temp:   Resp:    No intake or output data in the 24 hours ending 07/30/15 1355 Filed Weights   07/29/15 0915 07/29/15 1307 07/30/15 1315  Weight: 86.9 kg (191 lb 9.3 oz) 85.3 kg (188 lb 0.8 oz) 85.5 kg (188 lb 7.9 oz)    Exam:   General:  Elderly male not in distress  HEENT:  moist oral mucosa  Chest: Clear to auscultation bilaterally  CVS: Normal S1 and S2, no murmurs  GI: Soft, nondistended, nontender  Musculoskeletal: Warm, no edema  Data Reviewed: Basic Metabolic Panel:  Recent Labs Lab 07/28/15 0144 07/29/15 0622 07/30/15 0517  NA 137  135 139  K 5.3* 5.4* 4.6  CL 102 101 101  CO2 26 27 31   GLUCOSE 87 63* 84  BUN  21* 27* 14  CREATININE 6.02* 7.11* 4.99*  CALCIUM 8.0* 7.9* 8.0*   Liver Function Tests:  Recent Labs Lab 07/28/15 0144  AST 32  ALT 17  ALKPHOS 110  BILITOT 0.2*  PROT 5.2*  ALBUMIN 1.5*    Recent Labs Lab 07/28/15 0144  LIPASE 30   No results for input(s): AMMONIA in the last 168 hours. CBC:  Recent Labs Lab 07/28/15 0144 07/30/15 0517  WBC 9.8 9.2  NEUTROABS 7.3  --   HGB 11.7* 11.0*  HCT 39.7 36.2*  MCV 91.3 91.0  PLT 140* 163   Cardiac Enzymes: No results for input(s): CKTOTAL, CKMB, CKMBINDEX, TROPONINI in the last 168 hours. BNP (last 3 results)  Recent Labs  01/06/15 1758 04/18/15 1626 05/14/15 0130  BNP 83.3 >4500.0* >4500.0*    ProBNP (last 3 results) No results for input(s): PROBNP in the last 8760 hours.  CBG: No results for input(s): GLUCAP in the last 168 hours.  Recent Results (from the past 240 hour(s))  MRSA PCR Screening     Status: None   Collection Time: 07/28/15  7:38 PM  Result Value Ref Range Status   MRSA by PCR NEGATIVE NEGATIVE Final    Comment:        The GeneXpert MRSA Assay (FDA approved for NASAL specimens only), is one component of a comprehensive MRSA colonization surveillance program. It is not intended to diagnose MRSA infection nor to guide or monitor treatment for MRSA infections.      Studies: No results found.  Scheduled Meds: . aspirin EC  81 mg Oral Daily  . atorvastatin  40 mg Oral q1800  . carvedilol  3.125 mg Oral BID WC  . [START ON 08/06/2015] darbepoetin (ARANESP) injection - DIALYSIS  60 mcg Intravenous Q Wed-HD  . feeding supplement (PRO-STAT SUGAR FREE 64)  30 mL Oral BID  . hydroxychloroquine  200 mg Oral Daily  . multivitamin  1 tablet Oral QHS  . sevelamer carbonate  1,600 mg Oral TID WC  . cyanocobalamin  500 mcg Oral Daily   Continuous Infusions:     Time spent: 20 minutes    Travis [name]  [facility] Pager [phone] If 7PM-7AM, please contact night-coverage  at [email or web address], password [facility] 07/30/2015, 1:55 PM  LOS: 2 days

## 2015-07-31 ENCOUNTER — Observation Stay (HOSPITAL_COMMUNITY): Payer: Medicare Other

## 2015-07-31 DIAGNOSIS — N186 End stage renal disease: Secondary | ICD-10-CM | POA: Diagnosis not present

## 2015-07-31 DIAGNOSIS — Z992 Dependence on renal dialysis: Secondary | ICD-10-CM | POA: Diagnosis not present

## 2015-07-31 DIAGNOSIS — R5381 Other malaise: Secondary | ICD-10-CM | POA: Diagnosis not present

## 2015-07-31 DIAGNOSIS — K5641 Fecal impaction: Secondary | ICD-10-CM | POA: Diagnosis not present

## 2015-07-31 MED ORDER — LACTULOSE ENEMA
300.0000 mL | Freq: Once | ORAL | Status: AC
  Administered 2015-07-31: 300 mL via RECTAL
  Filled 2015-07-31: qty 300

## 2015-07-31 NOTE — Progress Notes (Signed)
[facility] Progress Note  Assessment/Plan: 1. LGIB/ fecal impaction - per primary/ GI 2. ESRD -HD mwf 3. HTN - low dose coreg 4. Volume - bed weights are off, stable on exam , euvolemic 5. Anemia - 11.0 today. Follow CBC. Will start ESA therapy if still in hospital at end of week.  6. Metabolic bone disease - Continue Renvela as ordered. Ca 7.9 C Ca 9.9  7. Nutrition - Albumin 1.5.Renal diet. Start Prostat, Nepro vits. Renal diet 8. Sjogren's disease: Cont. Hydroxychloroquine.   Kelly Splinter MD [facility] pager [phone]    cell [phone] 07/31/2015, 10:53 AM    Subjective:  Had small results from enema today, no new complaints  Objective Filed Vitals:   07/30/15 1551 07/30/15 1755 07/30/15 2210 07/31/15 0550  BP: 128/76 140/78 129/65 135/71  Pulse: 88 82 87 92  Temp: 98.1 F (36.7 C) 98.4 F (36.9 C) 98.3 F (36.8 C) 98.8 F (37.1 C)  TempSrc:  Oral Oral Oral  Resp: 20 18 18 18   Height:      Weight: 84.3 kg (185 lb 13.6 oz)   84.2 kg (185 lb 10 oz)  SpO2: 98% 98% 97% 100%   Physical Exam General: Thin, chronically ill appearing male in NAD diffuse skin thickening and cachectic Heart: S1, S2, II/VI M.  Lungs: bilateral breath sounds CTA Abdomen: Soft, non-tender, non-distended with normoactive bowel sounds Extremities: No LE edema Dialysis Access: RFA AVG + thrill + Bruit  Dialysis Orders: MWF [facility]  4h  64.5 kg   3/2.0 bath  RFA AVG  Heparin 6000 Mircera 75 mcg (Last dose 07/16/15) Venofer 50 mg IV weekly (dose to start this week-recent Fe load in HD unit)   Labs: Basic Metabolic Panel:  Recent Labs Lab 07/29/15 0622 07/30/15 0517 07/30/15 1502  NA 135 139 135  K 5.4* 4.6 4.4  CL 101 101 100*  CO2 27 31 29   GLUCOSE 63* 84 79  BUN 27* 14 15  CREATININE 7.11* 4.99* 5.05*  CALCIUM 7.9* 8.0* 7.8*  PHOS  --   --  3.6   Liver Function Tests:  Recent Labs Lab 07/28/15 0144 07/30/15 1502  AST 32  --   ALT 17   --   ALKPHOS 110  --   BILITOT 0.2*  --   PROT 5.2*  --   ALBUMIN 1.5* 1.4*    Recent Labs Lab 07/28/15 0144  LIPASE 30   CBC:  Recent Labs Lab 07/28/15 0144 07/30/15 0517 07/30/15 1502  WBC 9.8 9.2 9.5  NEUTROABS 7.3  --   --   HGB 11.7* 11.0* 11.1*  HCT 39.7 36.2* 36.6*  MCV 91.3 91.0 90.8  PLT 140* 163 164   Blood Culture    Component Value Date/Time   SDES BLOOD LEFT ANTECUBITAL 05/14/2015 0409   SPECREQUEST BOTTLES DRAWN AEROBIC ONLY 5CC 05/14/2015 0409   CULT NO GROWTH 5 DAYS 05/14/2015 0409   REPTSTATUS 05/19/2015 FINAL 05/14/2015 0409    Cardiac Enzymes: No results for input(s): CKTOTAL, CKMB, CKMBINDEX, TROPONINI in the last 168 hours. CBG: No results for input(s): GLUCAP in the last 168 hours. Iron Studies: No results for input(s): IRON, TIBC, TRANSFERRIN, FERRITIN in the last 72 hours. @lablastinr3 @ Studies/Results: Dg Abd 1 View  07/31/2015  CLINICAL DATA:  Abdominal pain EXAM: ABDOMEN - 1 VIEW COMPARISON:  CT abdomen and pelvis - 07/28/2015; 02/07/2015 FINDINGS: Nonobstructive bowel gas pattern.  Moderate colonic stool burden. Nondiagnostic evaluation for pneumoperitoneum secondary supine positioning and exclusion  of the lower thorax. No pneumatosis or portal venous gas. Radiopaque debris is seen scattered throughout the colon. Faster calcifications overlie the lower pelvis bilaterally. No definitive abnormal intra-abdominal calcifications. Limited visualization of lower thorax demonstrates a small left and trace right-sided pleural effusions with associated bibasilar opacities. Post IVC filter placement. No acute osseus abnormalities. IMPRESSION: Moderate colonic stool burden without evidence of enteric obstruction. Electronically Signed   By: Sandi Mariscal M.D.   On: 07/31/2015 07:46   Medications:   . aspirin EC  81 mg Oral Daily  . atorvastatin  40 mg Oral q1800  . carvedilol  3.125 mg Oral BID WC  . [START ON 08/06/2015] darbepoetin (ARANESP)  injection - DIALYSIS  60 mcg Intravenous Q Wed-HD  . feeding supplement (PRO-STAT SUGAR FREE 64)  30 mL Oral BID  . hydroxychloroquine  200 mg Oral Daily  . multivitamin  1 tablet Oral QHS  . sevelamer carbonate  1,600 mg Oral TID WC  . cyanocobalamin  500 mcg Oral Daily

## 2015-07-31 NOTE — Evaluation (Signed)
Physical Therapy Evaluation Patient Details Name: Travis [name] MRN: [id number] DOB: February 22, 1941 Today's Date: 07/31/2015   History of Present Illness  Pt adm with severe constipation. PMH - ESRD on HD, Sjogren's disease - Scleroderma, pulmonary fibrosis  Clinical Impression  Pt admitted with above diagnosis and presents to PT with functional limitations due to deficits listed below (See PT problem list). Pt needs skilled PT to maximize independence and safety to allow discharge to SNF prior to return home. Pt is very weak with significant dependency in all mobility.     Follow Up Recommendations SNF    Equipment Recommendations  None recommended by PT    Recommendations for Other Services OT consult     Precautions / Restrictions Precautions Precautions: Fall Restrictions Weight Bearing Restrictions: No      Mobility  Bed Mobility Overal bed mobility: Needs Assistance Bed Mobility: Supine to Sit;Sit to Supine;Rolling Rolling: Mod assist   Supine to sit: Mod assist;HOB elevated Sit to supine: Mod assist;HOB elevated   General bed mobility comments: Assist to bring legs over, elevate trunk into sitting, and bring hips to EOB. Assist to bring legs back up into bed.  Transfers Overall transfer level: Needs assistance Equipment used: Rolling walker (2 wheeled) Transfers: Sit to/from Stand Sit to Stand: Max assist         General transfer comment: Assist to bring hips and trunk up.  Ambulation/Gait             General Gait Details: Pt unable  Stairs            Wheelchair Mobility    Modified Rankin (Stroke Patients Only)       Balance                                             Pertinent Vitals/Pain Pain Assessment: Faces Faces Pain Scale: Hurts even more Pain Location: buttocks Pain Descriptors / Indicators: Grimacing;Tender Pain Intervention(s): Limited activity within patient's tolerance;Monitored during  session;Repositioned    Home Living Family/patient expects to be discharged to:: Private residence Living Arrangements: Spouse/significant other;Children Available Help at Discharge: Family;Available 24 hours/day (son lives with to assist) Type of Home: House Home Access: Stairs to enter Entrance Stairs-Rails: Right;Left;Can reach both Entrance Stairs-Number of Steps: 5 Home Layout: Two level;Laundry or work area in basement;Able to live on main level with bedroom/bathroom Home Equipment: Environmental consultant - 2 wheels;Bedside commode;Wheelchair - manual      Prior Function Level of Independence: Needs assistance   Gait / Transfers Assistance Needed: Past couple of weeks pt primarily using w/c. Prior to that pt using walker           Hand Dominance   Dominant Hand: Left    Extremity/Trunk Assessment   Upper Extremity Assessment: Defer to OT evaluation           Lower Extremity Assessment: RLE deficits/detail;LLE deficits/detail RLE Deficits / Details: less than 3/5 strength LLE Deficits / Details: less than 3/5     Communication   Communication: No difficulties  Cognition Arousal/Alertness: Awake/alert Behavior During Therapy: Flat affect Overall Cognitive Status: Impaired/Different from baseline Area of Impairment: Following commands;Problem solving       Following Commands: Follows one step commands with increased time     Problem Solving: Slow processing;Decreased initiation;Requires verbal cues      General Comments      Exercises  Assessment/Plan    PT Assessment Patient needs continued PT services  PT Diagnosis Difficulty walking;Generalized weakness   PT Problem List Decreased strength;Decreased activity tolerance;Decreased balance;Decreased mobility  PT Treatment Interventions DME instruction;Gait training;Functional mobility training;Therapeutic activities;Therapeutic exercise;Balance training;Patient/family education   PT Goals (Current goals  can be found in the Care Plan section) Acute Rehab PT Goals Patient Stated Goal: go home PT Goal Formulation: With patient Time For Goal Achievement: 08/14/15 Potential to Achieve Goals: Fair    Frequency Min 3X/week   Barriers to discharge        Co-evaluation               End of Session Equipment Utilized During Treatment: Gait belt;Oxygen Activity Tolerance: Patient limited by fatigue Patient left: in bed;with call bell/phone within reach;with bed alarm set;with family/visitor present Nurse Communication: Mobility status (tech)    Functional Assessment Tool Used: clinical judgement Functional Limitation: Mobility: Walking and moving around Mobility: Walking and Moving Around Current Status [id number]): At least 80 percent but less than 100 percent impaired, limited or restricted Mobility: Walking and Moving Around Goal Status [phone]): At least 20 percent but less than 40 percent impaired, limited or restricted    Time: 1120-1145 PT Time Calculation (min) (ACUTE ONLY): 25 min   Charges:   PT Evaluation $Initial PT Evaluation Tier I: 1 Procedure PT Treatments $Gait Training: 8-22 mins   PT G Codes:   PT G-Codes **NOT FOR INPATIENT CLASS** Functional Assessment Tool Used: clinical judgement Functional Limitation: Mobility: Walking and moving around Mobility: Walking and Moving Around Current Status [id number]): At least 80 percent but less than 100 percent impaired, limited or restricted Mobility: Walking and Moving Around Goal Status [phone]): At least 20 percent but less than 40 percent impaired, limited or restricted    [facility] 07/31/2015, 4:00 PM [place]

## 2015-07-31 NOTE — Progress Notes (Signed)
[name]  Wai Solt [id number] DOB: Dec 29, 1940 DOA: 07/28/2015 PCP: [name], MD   Brief narrative 74 year old male with a history of ESRD (MWF), systolic and diastolic CHF, recent hospitalization in 05/2015 for acute on chronic respiratory failure secondary to volume overload, hypertension, Sjogren syndrome, chronic respiratory failure on 2 L presented with three-day history of rectal bleeding, constipation, and rectal pain. The patient had given himself enemas 2 at home. He had some watery stool without any solid stool. Soapsuds enema was given after admission without significant improvement. CT of the abdomen and pelvis after admission revealed a stool ball in rectum with rectal wall thickening. Hgb has remained stable. GI and nephrology were consulted.      Assessment/Plan: Hematochezia/Proctitis with severe consitpation -07/28/2015 CT abdomen pelvis--stool ball and rectum with rectal wall thickening, moderate bilateral pleural effusions improved versus 05/16/2015 -Soapsuds enema given without effect. [facility]en by [facility] and ordered molasses enema last night without much effect either.  Placed on scheduled MiraLAX twice a day and tap water enema without much relief. Will order for lactulose and enema and continue scheduled MiraLAX. GI plan on sigmoidoscopy evaluation once bowel cleared to look for evidence of stercoral ulceration and  possible proctitis. -baseline Hgb 11-12  Pulmonary fibrosis/Chronic respiratory failure -Per PCCM note from 5/17 patient requires follow-up HRCT. HRCT obtained 05/16/15 and is currently without specific evidence of interstitial lung disease though it's utility was limited somewhat by atelectasis and current bronchiectasis as well as pleural effusions  - repeat HRCT is suggested in 12 months if concerns for pulmonary fibrosis persist. -on 2L at home. Stable at present.  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 On scheduled HD  HD associated Hypotension  -BP was stable with dialysis during this admission  Sjogren's disease - Scleroderma  -In May 2016 patient was discharged by [facility] outpatient on prednisone 5 mg daily however MAR does not show patient as currently being on steroid -Continue Plaquenil 200 mg daily -no convincing evidence of lung involvement per HRCT chest .  Chronic systolic and diastolic CHF - compensated -TTE 04/20/15-- w/ EF 35-40% - volume management per HD  History of DVT  -not on anticoagulation due to prior history of renal hematoma - s/p IVC filter 02/2015   Protein-calorie malnutrition, severe -avoiding Marinol due to likelihood of causing severe confusion in this patient. Continue supplement for now.  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 No bowel movement despite enema.  Objective: Filed Vitals:   07/31/15 0550  BP: 135/71  Pulse: 92  Temp: 98.8 F (37.1 C)  Resp: 18    Intake/Output Summary (Last 24 hours) at 07/31/15 1352 Last data filed at 07/31/15 0602  Gross per 24 hour  Intake    700 ml  Output   1000 ml  Net   -300 ml   Filed Weights   07/30/15 1315 07/30/15 1551 07/31/15 0550  Weight: 85.5 kg (188 lb 7.9 oz) 84.3 kg (185 lb 13.6 oz) 84.2 kg (185 lb 10 oz)    Exam:   General:  Elderly male not in distress  HEENT:  moist oral mucosa  Chest: Clear to auscultation bilaterally  CVS: Normal S1 and S2, no murmurs  GI: Soft, nondistended, nontender  Musculoskeletal: Warm, no edema, rt AV fistula  CNS: Alert and oriented  Data Reviewed: Basic Metabolic Panel:  Recent Labs Lab  07/28/15 0144 07/29/15 0622 07/30/15 0517 07/30/15 1502  NA 137 135 139 135  K 5.3* 5.4* 4.6 4.4  CL 102 101 101 100*  CO2 26 27 31 29   GLUCOSE 87 63* 84 79  BUN 21* 27* 14 15  CREATININE 6.02* 7.11* 4.99* 5.05*  CALCIUM 8.0* 7.9* 8.0* 7.8*  PHOS  --   --   --  3.6   Liver Function Tests:  Recent Labs Lab 07/28/15 0144 07/30/15 1502  AST 32  --   ALT 17  --   ALKPHOS 110  --   BILITOT 0.2*  --   PROT 5.2*  --   ALBUMIN 1.5* 1.4*    Recent Labs Lab 07/28/15 0144  LIPASE 30   No results for input(s): AMMONIA in the last 168 hours. CBC:  Recent Labs Lab 07/28/15 0144 07/30/15 0517 07/30/15 1502  WBC 9.8 9.2 9.5  NEUTROABS 7.3  --   --   HGB 11.7* 11.0* 11.1*  HCT 39.7 36.2* 36.6*  MCV 91.3 91.0 90.8  PLT 140* 163 164   Cardiac Enzymes: No results for input(s): CKTOTAL, CKMB, CKMBINDEX, TROPONINI in the last 168 hours. BNP (last 3 results)  Recent Labs  01/06/15 1758 04/18/15 1626 05/14/15 0130  BNP 83.3 >4500.0* >4500.0*    ProBNP (last 3 results) No results for input(s): PROBNP in the last 8760 hours.  CBG: No results for input(s): GLUCAP in the last 168 hours.  Recent Results (from the past 240 hour(s))  MRSA PCR Screening     Status: None   Collection Time: 07/28/15  7:38 PM  Result Value Ref Range Status   MRSA by PCR NEGATIVE NEGATIVE Final    Comment:        The GeneXpert MRSA Assay (FDA approved for NASAL specimens only), is one component of a comprehensive MRSA colonization surveillance program. It is not intended to diagnose MRSA infection nor to guide or monitor treatment for MRSA infections.      Studies: Dg Abd 1 View  07/31/2015  CLINICAL DATA:  Abdominal pain EXAM: ABDOMEN - 1 VIEW COMPARISON:  CT abdomen and pelvis - 07/28/2015; 02/07/2015 FINDINGS: Nonobstructive bowel gas pattern.  Moderate colonic stool burden. Nondiagnostic evaluation for pneumoperitoneum secondary supine positioning and exclusion of the lower  thorax. No pneumatosis or portal venous gas. Radiopaque debris is seen scattered throughout the colon. Faster calcifications overlie the lower pelvis bilaterally. No definitive abnormal intra-abdominal calcifications. Limited visualization of lower thorax demonstrates a small left and trace right-sided pleural effusions with associated bibasilar opacities. Post IVC filter placement. No acute osseus abnormalities. IMPRESSION: Moderate colonic stool burden without evidence of enteric obstruction. Electronically Signed   By: Sandi Mariscal M.D.   On: 07/31/2015 07:46    Scheduled Meds: . aspirin EC  81 mg Oral Daily  . atorvastatin  40 mg Oral q1800  . carvedilol  3.125 mg Oral BID WC  . [START ON 08/06/2015] darbepoetin (ARANESP) injection - DIALYSIS  60 mcg Intravenous Q Wed-HD  . feeding supplement (PRO-STAT SUGAR FREE 64)  30 mL Oral BID  . hydroxychloroquine  200 mg Oral Daily  . multivitamin  1 tablet Oral QHS  . sevelamer carbonate  1,600 mg Oral TID WC  . cyanocobalamin  500 mcg Oral Daily   Continuous Infusions:     Time spent: 20  minutes    Louellen Molder  [facility] Pager [phone] If 7PM-7AM, please contact night-coverage at [email or web address], password [facility] 07/31/2015, 1:52 PM  LOS: 3 days

## 2015-08-01 DIAGNOSIS — K5641 Fecal impaction: Secondary | ICD-10-CM | POA: Diagnosis not present

## 2015-08-01 DIAGNOSIS — Z992 Dependence on renal dialysis: Secondary | ICD-10-CM | POA: Diagnosis not present

## 2015-08-01 DIAGNOSIS — R5381 Other malaise: Secondary | ICD-10-CM | POA: Diagnosis not present

## 2015-08-01 DIAGNOSIS — N186 End stage renal disease: Secondary | ICD-10-CM | POA: Diagnosis not present

## 2015-08-01 MED ORDER — SODIUM CHLORIDE 0.9 % IV SOLN: 100.0000 mL | INTRAVENOUS | Status: DC | PRN

## 2015-08-01 MED ORDER — HEPARIN SODIUM (PORCINE) 1000 UNIT/ML DIALYSIS: 1000.0000 [IU] | INTRAMUSCULAR | Status: DC | PRN

## 2015-08-01 MED ORDER — DARBEPOETIN ALFA 25 MCG/0.42ML IJ SOSY
25.0000 ug | PREFILLED_SYRINGE | INTRAMUSCULAR | Status: DC
  Filled 2015-08-01: qty 0.42

## 2015-08-01 MED ORDER — POLYETHYLENE GLYCOL 3350 17 G PO PACK
17.0000 g | PACK | Freq: Two times a day (BID) | ORAL | Status: DC
  Administered 2015-08-01 – 2015-08-04 (×7): 17 g via ORAL
  Filled 2015-08-01 (×7): qty 1

## 2015-08-01 MED ORDER — DIPHENHYDRAMINE HCL 25 MG PO CAPS
25.0000 mg | ORAL_CAPSULE | Freq: Once | ORAL | Status: DC
  Filled 2015-08-01: qty 1

## 2015-08-01 MED ORDER — LIDOCAINE-PRILOCAINE 2.5-2.5 % EX CREA: 1.0000 "application " | TOPICAL_CREAM | CUTANEOUS | Status: DC | PRN

## 2015-08-01 MED ORDER — PENTAFLUOROPROP-TETRAFLUOROETH EX AERO: 1.0000 "application " | INHALATION_SPRAY | CUTANEOUS | Status: DC | PRN

## 2015-08-01 MED ORDER — ALTEPLASE 2 MG IJ SOLR
2.0000 mg | Freq: Once | INTRAMUSCULAR | Status: DC | PRN
  Filled 2015-08-01: qty 2

## 2015-08-01 MED ORDER — NEPRO/CARBSTEADY PO LIQD
237.0000 mL | Freq: Two times a day (BID) | ORAL | Status: DC
  Administered 2015-08-04: 237 mL via ORAL

## 2015-08-01 MED ORDER — LIDOCAINE HCL (PF) 1 % IJ SOLN: 5.0000 mL | INTRAMUSCULAR | Status: DC | PRN

## 2015-08-01 NOTE — Progress Notes (Signed)
[facility] Progress Note  Subjective: Patient states he has not had a bowel movement since his last enema. Complaining of some rectal pain. Not sure whether he is passing blood. Currently undergoing hemodialysis.  Objective: Vital signs in last 24 hours: Temp:  [98 F (36.7 C)-98.7 F (37.1 C)] 98 F (36.7 C) (11/18 0640) Pulse Rate:  [83-96] 89 (11/18 0800) Resp:  [18-20] 20 (11/18 0640) BP: (115-145)/(64-79) 115/74 mmHg (11/18 0800) SpO2:  [100 %] 100 % (11/18 0640) Weight:  [82.1 kg (181 lb)] 82.1 kg (181 lb) (11/18 0640) Weight change: -3.4 kg (-7 lb 7.9 oz)   PE: Detailed exam not done  Lab Results: No results found for this or any previous visit (from the past 24 hour(s)).  Studies/Results: Dg Abd 1 View  07/31/2015  CLINICAL DATA:  Abdominal pain EXAM: ABDOMEN - 1 VIEW COMPARISON:  CT abdomen and pelvis - 07/28/2015; 02/07/2015 FINDINGS: Nonobstructive bowel gas pattern.  Moderate colonic stool burden. Nondiagnostic evaluation for pneumoperitoneum secondary supine positioning and exclusion of the lower thorax. No pneumatosis or portal venous gas. Radiopaque debris is seen scattered throughout the colon. Faster calcifications overlie the lower pelvis bilaterally. No definitive abnormal intra-abdominal calcifications. Limited visualization of lower thorax demonstrates a small left and trace right-sided pleural effusions with associated bibasilar opacities. Post IVC filter placement. No acute osseus abnormalities. IMPRESSION: Moderate colonic stool burden without evidence of enteric obstruction. Electronically Signed   By: Sandi Mariscal M.D.   On: 07/31/2015 07:46      Assessment: Presumed fecal impaction, current status unclear Rectal bleeding  Plan: Will attempt digital rectal exam to assess for reachable impaction and also to document stools for blood. Consider further enemas, flexible sigmoidoscopy etc.    Madoline Bhatt C 08/01/2015, 8:14 AM  Pager  [phone] If no answer or after 5 PM call [phone]

## 2015-08-01 NOTE — Progress Notes (Signed)
PT Cancellation Note  Patient Details Name: Travis [name] MRN: [id number] DOB: [birth year]   Cancelled Treatment:    Reason Eval/Treat Not Completed: Patient declined, no reason specified. Pt stated he was too weak and adamantly refused despite much encouragement. Wife not pleased but explained to her that we can't forcibly treat the pt against his wishes. Will follow up as time allows.   Mandrell Vangilder 08/01/2015, 12:22 PM [employer] [place]

## 2015-08-01 NOTE — Clinical Social Work Note (Signed)
Clinical Social Work Assessment  Patient Details  Name: Travis [name] MRN: [id number] Date of Birth: January 01, 1941  Date of referral:  08/01/15               Reason for consult:  Facility Placement                Permission sought to share information with:  Chartered certified accountant granted to share information::  Yes, Verbal Permission Granted  Name::        Agency::  [facility]  Relationship::     Contact Information:     Housing/Transportation Living arrangements for the past 2 months:  [place] of Information:  Patient Patient Interpreter Needed:  None Criminal Activity/Legal Involvement Pertinent to Current Situation/Hospitalization:  No - Comment as needed Significant Relationships:  Spouse, Adult Children Lives with:  Spouse Do you feel safe going back to the place where you live?  Yes Need for family participation in patient care:  No (Coment)  Care giving concerns:  Pt lives with wife who is unable to provide sufficient support given current physical impairment   Facilities manager / plan:  CSW spoke with pt concerning PT recommendation for SNF  Employment status:  Retired Nurse, adult PT Recommendations:  [place] / Referral to community resources:  [place]  Patient/Family's Response to care:  Pt thinks that SNF would be a good idea and is agreeable to going to rehab in [place] area for a short period of time.  Patient/Family's Understanding of and Emotional Response to Diagnosis, Current Treatment, and Prognosis:  Pt is currently concerned about his inability to have a bowel movement and seemed very uncomfortable- wanting to know what he should do to ease his discomfort and what the MD plan was to stop his current condition.  Emotional Assessment Appearance:  Appears stated age Attitude/Demeanor/Rapport:    Affect (typically observed):   Appropriate, Restless Orientation:  Oriented to Situation, Oriented to  Time, Oriented to Place, Oriented to Self Alcohol / Substance use:  Not Applicable Psych involvement (Current and /or in the community):  No (Comment)  Discharge Needs  Concerns to be addressed:  Care Coordination Readmission within the last 30 days:  No Current discharge risk:  Physical Impairment Barriers to Discharge:  Continued Medical Work up   Cranford Mon, LCSW 08/01/2015, 5:42 PM

## 2015-08-01 NOTE — Progress Notes (Signed)
Per MD order RN attempt to disempact patient with finger. Unable to feel mass with finger. Soap Suds Enema given, tube 2 inches inside of patient  but water free flowing back out of rectum due to cough and questionable mass blockage. Did not meet resistance on insertion. No noticable blood but hemocult obtained and sent. No successful BM after SSE except small smearing of light brown stool. Md aware. Will continue to monitor.

## 2015-08-01 NOTE — Clinical Social Work Placement (Signed)
   CLINICAL SOCIAL WORK PLACEMENT  NOTE  Date:  08/01/2015  Patient Details  Name: Travis [name] MRN: [id number] Date of Birth: Feb 20, 1941  Clinical Social Work is seeking post-discharge placement for this patient at the [place] level of care (*CSW will initial, date and re-position this form in  chart as items are completed):  Yes   Patient/family provided with [place] Work Department's list of facilities offering this level of care within the geographic area requested by the patient (or if unable, by the patient's family).  Yes   Patient/family informed of their freedom to choose among providers that offer the needed level of care, that participate in Medicare, Medicaid or managed care program needed by the patient, have an available bed and are willing to accept the patient.  Yes   Patient/family informed of [facility]'s ownership interest in [facility] and [facility], as well as of the fact that they are under no obligation to receive care at these facilities.  PASRR submitted to EDS on 08/01/15     PASRR number received on 08/01/15     Existing PASRR number confirmed on       FL2 transmitted to all facilities in geographic area requested by pt/family on 08/01/15     FL2 transmitted to all facilities within larger geographic area on       Patient informed that his/her managed care company has contracts with or will negotiate with certain facilities, including the following:            Patient/family informed of bed offers received.  Patient chooses bed at       Physician recommends and patient chooses bed at      Patient to be transferred to   on  .  Patient to be transferred to facility by       Patient family notified on   of transfer.  Name of family member notified:        PHYSICIAN Please sign FL2     Additional Comment:    _______________________________________________ Cranford Mon, LCSW 08/01/2015,  5:44 PM

## 2015-08-01 NOTE — NC FL2 (Signed)
[place] MEDICAID FL2 LEVEL OF CARE SCREENING TOOL     IDENTIFICATION  Patient Name: Travis [name] Birthdate: June 01, 1941 Sex: male Admission Date (Current Location): 07/28/2015  [facility] and [place] Number: Herbalist and Address:  [facility] [place] [facility], [place] [address] [place],  [address]      Provider Number: [id number]  Attending Physician Name and Address:  Louellen Molder, MD  Relative Name and Phone Number:       Current Level of Care: Hospital Recommended Level of Care: [place] Prior Approval Number:    Date Approved/Denied:   PASRR Number:  [id number]  Discharge Plan:      Current Diagnoses: Patient Active Problem List   Diagnosis Date Noted  . Lower gastrointestinal bleeding 07/28/2015  . Rectal pain 07/28/2015  . Fecal impaction ([place]) 07/28/2015  . Rectal bleeding 07/28/2015  . Chronic respiratory failure ([place]) 07/28/2015  . Lower gastrointestinal bleed   . Pulmonary edema 05/14/2015  . Hypotension of hemodialysis 04/25/2015  . Acute systolic CHF (congestive heart failure) ([place]) 04/25/2015  . Protein-calorie malnutrition, severe ([place]) 04/19/2015  . NSTEMI (non-ST elevated myocardial infarction) ([place]) 04/18/2015  . ESRD on dialysis ([place])   . Acute renal failure syndrome ([place])   . AKI (acute kidney injury) ([place])   . Palliative care encounter   . Acute on chronic renal failure ([place]) 01/31/2015  . Acute DVT of right tibial vein ([place]) 01/29/2015  . Acute kidney injury ([place]) 01/29/2015  . DVT (deep venous thrombosis) ([place]) 01/29/2015  . Acute on chronic respiratory failure with hypoxia ([place]) 01/28/2015  . Physical deconditioning 01/28/2015  . Sjogren's disease ([place]) 01/28/2015  . Pedal edema 01/28/2015  . Peripheral edema 01/07/2015  . HCAP (healthcare-associated pneumonia) 01/06/2015  . Steroid-induced hyperglycemia 01/06/2015  . Dyspnea   . Pulmonary fibrosis ([place])   . Knee pain   . Shoulder pain    . 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 Anasarca 12/17/2014  . Fever 12/17/2014  . Elevated blood pressure 12/17/2014  . Normocytic anemia 12/17/2014    Orientation ACTIVITIES/SOCIAL BLADDER RESPIRATION    Self, Time, Situation, Place    Continent O2 (As needed) (2L)  BEHAVIORAL SYMPTOMS/MOOD NEUROLOGICAL BOWEL NUTRITION STATUS      Incontinent Diet (see DC summary)  PHYSICIAN VISITS COMMUNICATION OF NEEDS Height & Weight Skin    Verbally   181 lbs. Normal          AMBULATORY STATUS RESPIRATION    Assist extensive O2 (As needed) (2L)      Personal Care Assistance Level of Assistance  Bathing, Dressing Bathing Assistance: Limited assistance   Dressing Assistance: Limited assistance      Functional Limitations [place]  PT (By licensed PT)     PT Frequency: 5/wk             Additional Factors Info  Code Status, Allergies Code Status Info: FULL Allergies Info: NKA           Current Medications (08/01/2015): Current Facility-Administered Medications  Medication Dose Route Frequency Provider Last Rate Last Dose  . acetaminophen (TYLENOL) tablet 650 mg  650 mg Oral Q6H PRN Willia Craze, NP       Or  . acetaminophen (TYLENOL) suppository 650 mg  650 mg Rectal Q6H PRN Willia Craze, NP      . aspirin EC tablet 81 mg  81 mg Oral Daily Willia Craze, NP   81 mg at 08/01/15 1158  . atorvastatin (LIPITOR) tablet 40 mg  40 mg Oral q1800 Willia Craze, NP   40 mg at 08/01/15 1730  . bisacodyl (DULCOLAX) EC tablet 5 mg  5 mg Oral Daily PRN Willia Craze, NP      . carvedilol (COREG) tablet 3.125 mg  3.125 mg Oral BID WC Willia Craze, NP   3.125 mg at 08/01/15 1730  . Darbepoetin Alfa (ARANESP) injection 25 mcg  25 mcg Intravenous Q Fri-HD Alric Seton, PA-C   Stopped at 08/01/15 1150  . diphenhydrAMINE (BENADRYL) capsule 25 mg  25 mg Oral Once Gardiner Barefoot, NP   25 mg at  08/01/15 0530  . feeding supplement (NEPRO CARB STEADY) liquid 237 mL  237 mL Oral BID BM Alric Seton, PA-C   237 mL at 08/01/15 1000  . feeding supplement (PRO-STAT SUGAR FREE 64) liquid 30 mL  30 mL Oral BID Valentina Gu, NP   30 mL at 08/01/15 1159  . guaiFENesin-dextromethorphan (ROBITUSSIN DM) 100-10 MG/5ML syrup 5 mL  5 mL Oral Q4H PRN Gardiner Barefoot, NP   5 mL at 08/01/15 1730  . HYDROcodone-acetaminophen (NORCO/VICODIN) 5-325 MG per tablet 1-2 tablet  1-2 tablet Oral Q4H PRN Willia Craze, NP   2 tablet at 07/31/15 1533  . hydroxychloroquine (PLAQUENIL) tablet 200 mg  200 mg Oral Daily Willia Craze, NP   200 mg at 08/01/15 1157  . hydrOXYzine (ATARAX/VISTARIL) tablet 25 mg  25 mg Oral QHS PRN Willia Craze, NP      . multivitamin (RENA-VIT) tablet 1 tablet  1 tablet Oral QHS Willia Craze, NP   1 tablet at 07/31/15 2304  . ondansetron (ZOFRAN) tablet 4 mg  4 mg Oral Q6H PRN Willia Craze, NP       Or  . ondansetron [facility]) injection 4 mg  4 mg Intravenous Q6H PRN Willia Craze, NP      . polyethylene glycol (MIRALAX / GLYCOLAX) packet 17 g  17 g Oral BID Nishant Dhungel, MD   17 g at 08/01/15 1159  . sevelamer carbonate (RENVELA) tablet 1,600 mg  1,600 mg Oral TID WC Willia Craze, NP   1,600 mg at 08/01/15 1158  . vitamin B-12 (CYANOCOBALAMIN) tablet 500 mcg  500 mcg Oral Daily Willia Craze, NP   500 mcg at 08/01/15 1158   Do not use this list as official medication orders. Please verify with discharge summary.  Discharge Medications:   Medication List    ASK your doctor about these medications        aspirin 81 MG EC tablet  Take 1 tablet (81 mg total) by mouth daily.     atorvastatin 40 MG tablet  Commonly known as:  LIPITOR  Take 1 tablet (40 mg total) by mouth daily at 6 PM.     carvedilol 3.125 MG tablet  Commonly known as:  COREG  TAKE 1 TABLET BY MOUTH 2 TIMES DAILY WITH A MEAL     cyanocobalamin 500 MCG tablet  Take 1  tablet (500 mcg total) by mouth daily.     hydroxychloroquine 200 MG tablet  Commonly known as:  PLAQUENIL  Take 200 mg by mouth daily.     HYDROXYZINE PAMOATE PO  Take 25 mg by mouth at bedtime as needed. For sleep     multivitamin Tabs tablet  Take 1  tablet by mouth daily.     sevelamer carbonate 800 MG tablet  Commonly known as:  RENVELA  Take 2 tablets (1,600 mg total) by mouth 3 (three) times daily with meals.        Relevant Imaging Results:  Relevant Lab Results:  Recent Labs    Additional Information Pt has dialysis MWF at [facility], [place]

## 2015-08-01 NOTE — Progress Notes (Signed)
[facility] Progress Note  Assessment/Plan: 1. LGIB/ fecal impaction - per primary/ GI 2. ESRD -HD mwf on HD 3. HTN - low dose coreg 4. Volume - bed weights WAY OFF;, stable on exam , euvolemic- outpt EDW 64.5 - usually gets to 65 5. Anemia - 11.1 stable - due for redose on ESA 11/16 - redose low dose ESA today at 25 Aranesp 6. Metabolic bone disease - Continue Renvela as ordered. Ca 7.9 C Ca 9.9  7. Malnuutrition - Albumin 1.4 Renal diet./vits/Prostat, add Nepro   8. Sjogren's disease: Cont. Hydroxychloroquine.  Myriam Jacobson, PA-C [place] 08/01/2015,9:52 AM  LOS: 4 days   Pt seen, examined and agree w A/P as above.  Kelly Splinter MD [facility] pager [phone]    cell [phone] 08/01/2015, 11:00 AM    Subjective:     Objective Filed Vitals:   08/01/15 0800 08/01/15 0830 08/01/15 0900 08/01/15 0930  BP: 115/74 121/69 114/71 122/70  Pulse: 89 85 105 92  Temp:      TempSrc:      Resp:      Height:      Weight:      SpO2:       Physical Exam General: Heart: Lungs: Abdomen: Extremities: Dialysis Access: right lower AVGG  Dialysis Orders: MWF [facility] 4h 64.5 kg 3/2.0 bath RFA AVG Heparin 6000 Mircera 75 mcg (Last dose 07/16/15)- last outpt Hgb 12 Venofer 50 mg IV weekly (dose to start this week-recent Fe load in HD unit)   Additional Objective Labs: Basic Metabolic Panel:  Recent Labs Lab 07/29/15 0622 07/30/15 0517 07/30/15 1502  NA 135 139 135  K 5.4* 4.6 4.4  CL 101 101 100*  CO2 27 31 29   GLUCOSE 63* 84 79  BUN 27* 14 15  CREATININE 7.11* 4.99* 5.05*  CALCIUM 7.9* 8.0* 7.8*  PHOS  --   --  3.6   Liver Function Tests:  Recent Labs Lab 07/28/15 0144 07/30/15 1502  AST 32  --   ALT 17  --   ALKPHOS 110  --   BILITOT 0.2*  --   PROT 5.2*  --   ALBUMIN 1.5* 1.4*    Recent Labs Lab 07/28/15 0144  LIPASE 30   CBC:  Recent Labs Lab 07/28/15 0144  07/30/15 0517 07/30/15 1502  WBC 9.8 9.2 9.5  NEUTROABS 7.3  --   --   HGB 11.7* 11.0* 11.1*  HCT 39.7 36.2* 36.6*  MCV 91.3 91.0 90.8  PLT 140* 163 164   Studies/Results: Dg Abd 1 View  07/31/2015  CLINICAL DATA:  Abdominal pain EXAM: ABDOMEN - 1 VIEW COMPARISON:  CT abdomen and pelvis - 07/28/2015; 02/07/2015 FINDINGS: Nonobstructive bowel gas pattern.  Moderate colonic stool burden. Nondiagnostic evaluation for pneumoperitoneum secondary supine positioning and exclusion of the lower thorax. No pneumatosis or portal venous gas. Radiopaque debris is seen scattered throughout the colon. Faster calcifications overlie the lower pelvis bilaterally. No definitive abnormal intra-abdominal calcifications. Limited visualization of lower thorax demonstrates a small left and trace right-sided pleural effusions with associated bibasilar opacities. Post IVC filter placement. No acute osseus abnormalities. IMPRESSION: Moderate colonic stool burden without evidence of enteric obstruction. Electronically Signed   By: Sandi Mariscal M.D.   On: 07/31/2015 07:46   Medications:   . aspirin EC  81 mg Oral Daily  . atorvastatin  40 mg Oral q1800  . carvedilol  3.125 mg Oral BID WC  . [START ON  08/06/2015] darbepoetin (ARANESP) injection - DIALYSIS  60 mcg Intravenous Q Wed-HD  . diphenhydrAMINE  25 mg Oral Once  . feeding supplement (PRO-STAT SUGAR FREE 64)  30 mL Oral BID  . hydroxychloroquine  200 mg Oral Daily  . multivitamin  1 tablet Oral QHS  . sevelamer carbonate  1,600 mg Oral TID WC  . cyanocobalamin  500 mcg Oral Daily

## 2015-08-01 NOTE — Progress Notes (Signed)
[name]  Travis [name] [id number] DOB: 06-04-1941 DOA: 07/28/2015 PCP: [name], MD   Brief narrative 74 year old male with a history of ESRD (MWF), systolic and diastolic CHF, recent hospitalization in 05/2015 for acute on chronic respiratory failure secondary to volume overload, hypertension, Sjogren syndrome, chronic respiratory failure on 2 L presented with three-day history of rectal bleeding, constipation, and rectal pain. The patient had given himself enemas 2 at home. He had some watery stool without any solid stool. Soapsuds enema was given after admission without significant improvement. CT of the abdomen and pelvis after admission revealed a stool ball in rectum with rectal wall thickening. Hgb has remained stable. GI and nephrology were consulted.      Assessment/Plan: Hematochezia/Proctitis with severe consitpation -07/28/2015 CT abdomen pelvis--stool ball and rectum with rectal wall thickening, moderate bilateral pleural effusions improved versus 05/16/2015 -Soapsuds enema given without effect. [facility]en by [facility] and ordered molasses enema last night without much effect either.  Placed on scheduled MiraLAX twice a day and tap water enema without much relief.  -Lactulose enema did not help either. Order for digital disimpaction followed by soapsuds enema. GI plan on sigmoidoscopy evaluation once bowel cleared to look for evidence of stercoral ulceration and  possible proctitis. -baseline Hgb 11-12  Pulmonary fibrosis/Chronic respiratory failure -Per PCCM note from 5/17 patient requires follow-up HRCT. HRCT obtained 05/16/15 and is currently without specific evidence of interstitial lung disease though it's utility was limited somewhat by atelectasis and current bronchiectasis as well as pleural effusions  - repeat HRCT is suggested in 12 months if concerns for pulmonary fibrosis persist. -on 2L at home. Stable at present.  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 On scheduled HD  HD associated Hypotension  -BP was stable with dialysis during this admission  Sjogren's disease - Scleroderma  -In May 2016 patient was discharged by [facility] outpatient on prednisone 5 mg daily however MAR does not show patient as currently being on steroid -Continue Plaquenil 200 mg daily -no convincing evidence of lung involvement per HRCT chest .  Chronic systolic and diastolic CHF - compensated -TTE 04/20/15-- w/ EF 35-40% - volume management per HD  History of DVT  -not on anticoagulation due to prior history of renal hematoma - s/p IVC filter 02/2015   Protein-calorie malnutrition, severe -avoiding Marinol due to likelihood of causing severe confusion in this patient. Continue supplement for now.  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 Still has not had a bowel movement.  Objective: Filed Vitals:   08/01/15 1410  BP: 140/71  Pulse: 93  Temp: 99 F (37.2 C)  Resp: 18    Intake/Output Summary (Last 24 hours) at 08/01/15 1445 Last data filed at 08/01/15 1203  Gross per 24 hour  Intake    600 ml  Output    350 ml  Net    250 ml   Filed Weights   07/30/15 1551 07/31/15 0550 08/01/15 0640  Weight: 84.3 kg (185 lb 13.6 oz) 84.2 kg (185 lb 10 oz) 82.1 kg (181 lb)    Exam:   General:   not in distress  HEENT:  moist oral mucosa  Chest: Clear to auscultation bilaterally  CVS:  Normal S1 and S2, no murmurs  GI: Soft, nondistended, nontender  Musculoskeletal: Warm, no edema, rt AV fistula    Data Reviewed: Basic Metabolic Panel:  Recent Labs Lab  07/28/15 0144 07/29/15 0622 07/30/15 0517 07/30/15 1502  NA 137 135 139 135  K 5.3* 5.4* 4.6 4.4  CL 102 101 101 100*  CO2 26 27 31 29   GLUCOSE 87 63* 84 79  BUN 21* 27* 14 15  CREATININE 6.02* 7.11* 4.99* 5.05*  CALCIUM 8.0* 7.9* 8.0* 7.8*  PHOS  --   --   --  3.6   Liver Function Tests:  Recent Labs Lab 07/28/15 0144 07/30/15 1502  AST 32  --   ALT 17  --   ALKPHOS 110  --   BILITOT 0.2*  --   PROT 5.2*  --   ALBUMIN 1.5* 1.4*    Recent Labs Lab 07/28/15 0144  LIPASE 30   No results for input(s): AMMONIA in the last 168 hours. CBC:  Recent Labs Lab 07/28/15 0144 07/30/15 0517 07/30/15 1502  WBC 9.8 9.2 9.5  NEUTROABS 7.3  --   --   HGB 11.7* 11.0* 11.1*  HCT 39.7 36.2* 36.6*  MCV 91.3 91.0 90.8  PLT 140* 163 164   Cardiac Enzymes: No results for input(s): CKTOTAL, CKMB, CKMBINDEX, TROPONINI in the last 168 hours. BNP (last 3 results)  Recent Labs  01/06/15 1758 04/18/15 1626 05/14/15 0130  BNP 83.3 >4500.0* >4500.0*    ProBNP (last 3 results) No results for input(s): PROBNP in the last 8760 hours.  CBG: No results for input(s): GLUCAP in the last 168 hours.  Recent Results (from the past 240 hour(s))  MRSA PCR Screening     Status: None   Collection Time: 07/28/15  7:38 PM  Result Value Ref Range Status   MRSA by PCR NEGATIVE NEGATIVE Final    Comment:        The GeneXpert MRSA Assay (FDA approved for NASAL specimens only), is one component of a comprehensive MRSA colonization surveillance program. It is not intended to diagnose MRSA infection nor to guide or monitor treatment for MRSA infections.      Studies: Dg Abd 1 View  07/31/2015  CLINICAL DATA:  Abdominal pain EXAM: ABDOMEN - 1 VIEW COMPARISON:  CT abdomen and pelvis - 07/28/2015; 02/07/2015 FINDINGS: Nonobstructive bowel gas pattern.  Moderate colonic stool burden. Nondiagnostic evaluation for pneumoperitoneum secondary supine positioning and exclusion of the lower  thorax. No pneumatosis or portal venous gas. Radiopaque debris is seen scattered throughout the colon. Faster calcifications overlie the lower pelvis bilaterally. No definitive abnormal intra-abdominal calcifications. Limited visualization of lower thorax demonstrates a small left and trace right-sided pleural effusions with associated bibasilar opacities. Post IVC filter placement. No acute osseus abnormalities. IMPRESSION: Moderate colonic stool burden without evidence of enteric obstruction. Electronically Signed   By: Sandi Mariscal M.D.   On: 07/31/2015 07:46    Scheduled Meds: . aspirin EC  81 mg Oral Daily  . atorvastatin  40 mg Oral q1800  . carvedilol  3.125 mg Oral BID WC  . darbepoetin (ARANESP) injection - DIALYSIS  25 mcg Intravenous Q Fri-HD  . diphenhydrAMINE  25 mg Oral Once  . feeding supplement (NEPRO CARB STEADY)  237 mL Oral BID BM  . feeding supplement (PRO-STAT SUGAR FREE 64)  30 mL Oral BID  . hydroxychloroquine  200 mg Oral Daily  . multivitamin  1 tablet Oral QHS  . polyethylene glycol  17 g  Oral BID  . sevelamer carbonate  1,600 mg Oral TID WC  . cyanocobalamin  500 mcg Oral Daily   Continuous Infusions:     Time spent: 20 minutes    Travis [name]  [facility] Pager [phone] If 7PM-7AM, please contact night-coverage at [email or web address], password [facility] 08/01/2015, 2:45 PM  LOS: 4 days

## 2015-08-01 NOTE — Progress Notes (Signed)
Called IV team to gain IV access, due to pt. Being a hard stick. IV team unable to obtain IV. Pt. States "I'm not here for this". Informed MD and MD states "okay to leave IV out". No further needs noted at this time.

## 2015-08-02 DIAGNOSIS — Z992 Dependence on renal dialysis: Secondary | ICD-10-CM | POA: Diagnosis not present

## 2015-08-02 DIAGNOSIS — Z515 Encounter for palliative care: Secondary | ICD-10-CM

## 2015-08-02 DIAGNOSIS — K5641 Fecal impaction: Secondary | ICD-10-CM | POA: Diagnosis not present

## 2015-08-02 DIAGNOSIS — N186 End stage renal disease: Secondary | ICD-10-CM | POA: Diagnosis not present

## 2015-08-02 DIAGNOSIS — K922 Gastrointestinal hemorrhage, unspecified: Secondary | ICD-10-CM | POA: Diagnosis not present

## 2015-08-02 LAB — BASIC METABOLIC PANEL
Anion gap: 6 (ref 5–15)
BUN: 19 mg/dL (ref 6–20)
CALCIUM: 7.8 mg/dL — AB (ref 8.9–10.3)
CHLORIDE: 96 mmol/L — AB (ref 101–111)
CO2: 31 mmol/L (ref 22–32)
CREATININE: 3.93 mg/dL — AB (ref 0.61–1.24)
GFR calc non Af Amer: 14 mL/min — ABNORMAL LOW (ref >60)
GFR, EST AFRICAN AMERICAN: 16 mL/min — AB (ref >60)
Glucose, Bld: 97 mg/dL (ref 65–99)
Potassium: 3.7 mmol/L (ref 3.5–5.1)
SODIUM: 133 mmol/L — AB (ref 135–145)

## 2015-08-02 NOTE — Progress Notes (Signed)
0900 Tap Water enema given. No BM still.  36 Paged Dr Clementeen Graham d/t pt c/o severe rectal pain. Pt states, "I feel like I have to have a BM, but it won't come out". Dr Clementeen Graham ordered to disimpact pt.

## 2015-08-02 NOTE — Progress Notes (Signed)
Scheduled meeting with pt., and pt's wife, tomorrow 08-03-15 at 0930 to discuss goals of care or other issues relevant to patient and family. Pt has been seen by PMT in May, June, as well as August of this year and has been able to articulate his own needs but now is more confused with this admission. Thank you, Romona Curls, ANP-ACHPN

## 2015-08-02 NOTE — Progress Notes (Signed)
[facility] Progress Note  Subjective: Patient still complaining of rectal pain. Digital exam by nurse with no impaction revealed noted  Objective: Vital signs in last 24 hours: Temp:  [98.1 F (36.7 C)-99 F (37.2 C)] 98.3 F (36.8 C) (11/19 0527) Pulse Rate:  [85-105] 87 (11/19 0527) Resp:  [17-20] 18 (11/19 0527) BP: (114-140)/(69-80) 139/75 mmHg (11/19 0527) SpO2:  [100 %] 100 % (11/19 0527) Weight:  [82.6 kg (182 lb 1.6 oz)] 82.6 kg (182 lb 1.6 oz) (11/19 0527) Weight change: 0.5 kg (1 lb 1.6 oz)   PE: Unchanged  Lab Results: Results for orders placed or performed during the hospital encounter of 07/28/15 (from the past 24 hour(s))  Basic metabolic panel     Status: Abnormal   Collection Time: 08/02/15  4:26 AM  Result Value Ref Range   Sodium 133 (L) 135 - 145 mmol/L   Potassium 3.7 3.5 - 5.1 mmol/L   Chloride 96 (L) 101 - 111 mmol/L   CO2 31 22 - 32 mmol/L   Glucose, Bld 97 65 - 99 mg/dL   BUN 19 6 - 20 mg/dL   Creatinine, Ser 3.93 (H) 0.61 - 1.24 mg/dL   Calcium 7.8 (L) 8.9 - 10.3 mg/dL   GFR calc non Af Amer 14 (L) >60 mL/min   GFR calc Af Amer 16 (L) >60 mL/min   Anion gap 6 5 - 15    Studies/Results: No results found.    Assessment: Recent fecal impaction unclear whether resolved, associated with rectal bleeding  Plan: Will plan flexible sigmoidoscopy on Monday.    Sharron Petruska C 08/02/2015, 8:09 AM  Pager [phone] If no answer or after 5 PM call [phone]

## 2015-08-02 NOTE — Progress Notes (Signed)
[facility] Progress Note  Assessment: 1. LGIB/ fecal impaction - per primary/ GI. For flex sig on Mon. S/p enemas 2. ESRD -HD Monday 3. HTN - low dose coreg 4. Volume - bed weights WAY OFF;, stable on exam 5. Anemia - 11.1 stable - due for redose on ESA 11/16 - redose low dose ESA today at 25 Aranesp 6. Metabolic bone disease - Continue Renvela as ordered. Ca 7.9 C Ca 9.9  7. Malnuutrition - Albumin 1.4 Renal diet./vits/Prostat, add Nepro   8. Sjogren's disease: Cont. Hydroxychloroquine.  Plan -  HD Monday, minimal UF  Kelly Splinter MD [place] pager [phone]    cell [phone] 08/02/2015, 9:54 AM    Subjective:     Objective Filed Vitals:   08/01/15 1157 08/01/15 1410 08/01/15 2255 08/02/15 0527  BP: 124/80 140/71 127/69 139/75  Pulse: 90 93 93 87  Temp:  99 F (37.2 C) 98.8 F (37.1 C) 98.3 F (36.8 C)  TempSrc:   Oral Oral  Resp:  18 17 18   Height:      Weight:    82.6 kg (182 lb 1.6 oz)  SpO2:  100% 100% 100%   Physical Exam General: alert, frail elderly BM. Gets agitated occ Heart: RRR no RG Lungs: some fine rales at bases, no rhonchi Abdomen: soft, ntnd no ascites Extremities: no LE or UE edema, cachectic, skin hardening diffusely upper and lower ext/ torso Dialysis Access: right lower AVGG  Dialysis Orders: MWF [facility] 4h 64.5 kg 3/2.0 bath RFA AVG Heparin 6000 Mircera 75 mcg (Last dose 07/16/15)- last outpt Hgb 12 Venofer 50 mg IV weekly (dose to start this week-recent Fe load in HD unit)   Additional Objective Labs: Basic Metabolic Panel:  Recent Labs Lab 07/30/15 0517 07/30/15 1502 08/02/15 0426  NA 139 135 133*  K 4.6 4.4 3.7  CL 101 100* 96*  CO2 31 29 31   GLUCOSE 84 79 97  BUN 14 15 19   CREATININE 4.99* 5.05* 3.93*  CALCIUM 8.0* 7.8* 7.8*  PHOS  --  3.6  --    Liver Function Tests:  Recent Labs Lab 07/28/15 0144 07/30/15 1502  AST 32  --   ALT 17  --   ALKPHOS 110  --   BILITOT 0.2*   --   PROT 5.2*  --   ALBUMIN 1.5* 1.4*    Recent Labs Lab 07/28/15 0144  LIPASE 30   CBC:  Recent Labs Lab 07/28/15 0144 07/30/15 0517 07/30/15 1502  WBC 9.8 9.2 9.5  NEUTROABS 7.3  --   --   HGB 11.7* 11.0* 11.1*  HCT 39.7 36.2* 36.6*  MCV 91.3 91.0 90.8  PLT 140* 163 164   Studies/Results: No results found. Medications:   . aspirin EC  81 mg Oral Daily  . atorvastatin  40 mg Oral q1800  . carvedilol  3.125 mg Oral BID WC  . darbepoetin (ARANESP) injection - DIALYSIS  25 mcg Intravenous Q Fri-HD  . diphenhydrAMINE  25 mg Oral Once  . feeding supplement (NEPRO CARB STEADY)  237 mL Oral BID BM  . feeding supplement (PRO-STAT SUGAR FREE 64)  30 mL Oral BID  . hydroxychloroquine  200 mg Oral Daily  . multivitamin  1 tablet Oral QHS  . polyethylene glycol  17 g Oral BID  . sevelamer carbonate  1,600 mg Oral TID WC  . cyanocobalamin  500 mcg Oral Daily

## 2015-08-02 NOTE — Progress Notes (Signed)
[name]  Travis [name] [id number] DOB: 1941-08-15 DOA: 07/28/2015 PCP: [name], MD   Brief narrative 74 year old male with a history of ESRD (MWF), systolic and diastolic CHF, recent hospitalization in 05/2015 for acute on chronic respiratory failure secondary to volume overload, hypertension, Sjogren syndrome, chronic respiratory failure on 2 L presented with three-day history of rectal bleeding, constipation, and rectal pain. The patient had given himself enemas 2 at home. He had some watery stool without any solid stool. Soapsuds enema was given after admission without significant improvement. CT of the abdomen and pelvis after admission revealed a stool ball in rectum with rectal wall thickening. Hgb has remained stable. GI and nephrology were consulted.      Assessment/Plan: Hematochezia/Proctitis with severe consitpation -07/28/2015 CT abdomen pelvis--stool ball and rectum with rectal wall thickening, moderate bilateral pleural effusions improved versus 05/16/2015 -Multiple rounds of daily enemas and scheduled MiraLAX without much help. Manual disimpaction unsuccessful yesterday. Will ask nursing to attempt again today. GI plan on flexible sigmoidoscopy on 11/21.  Pulmonary fibrosis/Chronic respiratory failure -Per PCCM note from 5/17 patient requires follow-up HRCT. HRCT obtained 05/16/15 and is currently without specific evidence of interstitial lung disease though it's utility was limited somewhat by atelectasis and current bronchiectasis as well as pleural effusions  - repeat HRCT is suggested in 12 months if concerns for pulmonary fibrosis persist. -on 2L at home. Stable at present.  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 On scheduled HD  HD associated Hypotension  -BP was stable with dialysis during this admission  Sjogren's disease - Scleroderma  -In May 2016 patient was discharged by [facility]  outpatient on prednisone 5 mg daily however MAR does not show patient as currently being on steroid -Continue Plaquenil 200 mg daily -no convincing evidence of lung involvement per HRCT chest .  Chronic systolic and diastolic CHF - compensated -TTE 04/20/15-- w/ EF 35-40% - volume management per HD  History of DVT  -not on anticoagulation due to prior history of renal hematoma - s/p IVC filter 02/2015   Protein-calorie malnutrition, severe -avoiding Marinol due to likelihood of causing severe confusion in this patient. 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   HPI/Subjective: Seen and examined. Still unsuccessful in having bowel movement. Attempted manual disimpaction by nurse yesterday but unsuccessful.  Objective: Filed Vitals:   08/02/15 0527  BP: 139/75  Pulse: 87  Temp: 98.3 F (36.8 C)  Resp: 18    Intake/Output Summary (Last 24 hours) at 08/02/15 1124 Last data filed at 08/02/15 0650  Gross per 24 hour  Intake    680 ml  Output      1 ml  Net    679 ml   Filed Weights   07/31/15 0550 08/01/15 0640 08/02/15 0527  Weight: 84.2 kg (185 lb 10 oz) 82.1 kg (181 lb) 82.6 kg (182 lb 1.6 oz)    Exam:   General:   not in distress  HEENT:  moist oral mucosa  Chest: Clear to auscultation bilaterally  CVS: Normal S1 and S2, no murmurs  GI: Soft, nondistended, nontender  Musculoskeletal: Warm, no edema, rt AV fistula  CNS: Alert and awake, has episodic confusion which as per wife is  baseline    Data Reviewed: Basic Metabolic Panel:  Recent Labs Lab 07/28/15 0144 07/29/15 0622 07/30/15 0517 07/30/15 1502 08/02/15 0426  NA 137 135 139 135 133*  K  5.3* 5.4* 4.6 4.4 3.7  CL 102 101 101 100* 96*  CO2 26 27 31 29 31   GLUCOSE 87 63* 84 79 97  BUN 21* 27* 14 15 19   CREATININE 6.02* 7.11* 4.99* 5.05* 3.93*  CALCIUM 8.0* 7.9* 8.0* 7.8* 7.8*  PHOS  --   --   --  3.6  --    Liver Function Tests:  Recent Labs Lab 07/28/15 0144 07/30/15 1502  AST 32  --   ALT 17  --   ALKPHOS 110  --   BILITOT 0.2*  --   PROT 5.2*  --   ALBUMIN 1.5* 1.4*    Recent Labs Lab 07/28/15 0144  LIPASE 30   No results for input(s): AMMONIA in the last 168 hours. CBC:  Recent Labs Lab 07/28/15 0144 07/30/15 0517 07/30/15 1502  WBC 9.8 9.2 9.5  NEUTROABS 7.3  --   --   HGB 11.7* 11.0* 11.1*  HCT 39.7 36.2* 36.6*  MCV 91.3 91.0 90.8  PLT 140* 163 164   Cardiac Enzymes: No results for input(s): CKTOTAL, CKMB, CKMBINDEX, TROPONINI in the last 168 hours. BNP (last 3 results)  Recent Labs  01/06/15 1758 04/18/15 1626 05/14/15 0130  BNP 83.3 >4500.0* >4500.0*    ProBNP (last 3 results) No results for input(s): PROBNP in the last 8760 hours.  CBG: No results for input(s): GLUCAP in the last 168 hours.  Recent Results (from the past 240 hour(s))  MRSA PCR Screening     Status: None   Collection Time: 07/28/15  7:38 PM  Result Value Ref Range Status   MRSA by PCR NEGATIVE NEGATIVE Final    Comment:        The GeneXpert MRSA Assay (FDA approved for NASAL specimens only), is one component of a comprehensive MRSA colonization surveillance program. It is not intended to diagnose MRSA infection nor to guide or monitor treatment for MRSA infections.      Studies: No results found.  Scheduled Meds: . aspirin EC  81 mg Oral Daily  . atorvastatin  40 mg Oral q1800  . carvedilol  3.125 mg Oral BID WC  . darbepoetin (ARANESP) injection - DIALYSIS  25 mcg Intravenous Q Fri-HD  . diphenhydrAMINE  25 mg Oral Once  . feeding supplement (NEPRO CARB STEADY)  237 mL Oral BID BM  . feeding supplement (PRO-STAT SUGAR FREE 64)  30 mL Oral  BID  . hydroxychloroquine  200 mg Oral Daily  . multivitamin  1 tablet Oral QHS  . polyethylene glycol  17 g Oral BID  . sevelamer carbonate  1,600 mg Oral TID WC  . cyanocobalamin  500 mcg Oral Daily   Continuous Infusions:     Time spent: 20 minutes    Mackenzie Groom  [facility] Pager [phone] If 7PM-7AM, please contact night-coverage at [email or web address], password [email or web address] 08/02/2015, 11:24 AM  LOS: 5 days

## 2015-08-03 ENCOUNTER — Observation Stay (HOSPITAL_COMMUNITY): Payer: Medicare Other

## 2015-08-03 DIAGNOSIS — R059 Cough, unspecified: Secondary | ICD-10-CM | POA: Diagnosis present

## 2015-08-03 DIAGNOSIS — R05 Cough: Secondary | ICD-10-CM | POA: Diagnosis present

## 2015-08-03 DIAGNOSIS — Z515 Encounter for palliative care: Secondary | ICD-10-CM | POA: Diagnosis not present

## 2015-08-03 DIAGNOSIS — N186 End stage renal disease: Secondary | ICD-10-CM | POA: Diagnosis not present

## 2015-08-03 DIAGNOSIS — J189 Pneumonia, unspecified organism: Secondary | ICD-10-CM

## 2015-08-03 DIAGNOSIS — Z992 Dependence on renal dialysis: Secondary | ICD-10-CM | POA: Diagnosis not present

## 2015-08-03 DIAGNOSIS — K5641 Fecal impaction: Secondary | ICD-10-CM | POA: Diagnosis not present

## 2015-08-03 MED ORDER — SENNA 8.6 MG PO TABS
2.0000 | ORAL_TABLET | Freq: Every day | ORAL | Status: DC
  Administered 2015-08-03: 17.2 mg via ORAL
  Filled 2015-08-03: qty 2

## 2015-08-03 MED ORDER — LEVOFLOXACIN 750 MG PO TABS
750.0000 mg | ORAL_TABLET | Freq: Once | ORAL | Status: AC
  Administered 2015-08-03: 750 mg via ORAL
  Filled 2015-08-03: qty 1

## 2015-08-03 MED ORDER — BISACODYL 5 MG PO TBEC: 10.0000 mg | DELAYED_RELEASE_TABLET | Freq: Every day | ORAL | Status: DC | PRN

## 2015-08-03 MED ORDER — LEVOFLOXACIN 500 MG PO TABS: 500.0000 mg | ORAL_TABLET | ORAL | Status: DC

## 2015-08-03 NOTE — Consult Note (Signed)
Consultation Note Date: 08/03/2015   Patient Name: Travis [name]  DOB: 1941/05/15  MRN: [id number]  Age / Sex: 74 y.o., male  PCP: Dibas Koirala, MD Referring Physician: Louellen Molder, MD  Reason for Consultation: Establishing goals of care; non pain symptom mgt    Clinical Assessment/Narrative: Pt is a 74 yo man with a h/o ESRD on HD starting approx 5 months ago, combined heart failure with EF 35-40%, HTN, Sjogren syndrome, h/o DVT, severe protein calorie malnutrition with albumin of 1.4, admitted after 3 day period of rectal bleeding and severe constipation not relieved by enemas at home. CT of abd revealed stool ball in rectum with rectal wall thickening. He has had some bowel movents in the hospital but is experiencing intermittent severe rectal pain and pressure. He is sch for a flex sig 08/04/15.  He is extremely weak. He is only minimally able to help turn himself in the bed. He states he has not been able to walk for the past 2 weeks. Prior to 2 weeks, he was walking with a walker. His appetite is very poor. He also has a constant productive cough, with yellowish sputum  Contacts/Participants in Discussion: Pt's son, wife and pt Primary Decision Maker: Pt at this point, but wife wold be next in line Relationship to Patient pt and spouse HCPOA: yes Wife shares that she supports her husbands decisions. On the phone she shared that they had an agreement that "he would outlive me", but it appears as though his decision to pursue dialysis was partly for his family but he also stated he "wasn't ready to die".    SUMMARY OF RECOMMENDATIONS DNR/DNI Cont HD. Pt shares that he does not think he would ever be able to stop dialysis on his own, but would follow medical advice when it is time to stop Would want transfusion Would want antibiotics  Code Status/Advance Care Planning: DNR    Code Status Orders        Start     Ordered   08/03/15 1028  Do not attempt resuscitation (DNR)   Continuous    Question Answer Comment  In the event of cardiac or respiratory ARREST Do not call a "code blue"   In the event of cardiac or respiratory ARREST Do not perform Intubation, CPR, defibrillation or ACLS   In the event of cardiac or respiratory ARREST Use medication by any route, position, wound care, and other measures to relive pain and suffering. May use oxygen, suction and manual treatment of airway obstruction as needed for comfort.      08/03/15 1027       Symptom Management:   Pain: Trying to minimize constipating agents such as opioids but cont prn Vicodin for moderate to severe rectal pain until flex sig results are back. He was on this at home and usually took nightly . ? If this is what led to severe constipation in abcense of sch laxative  Cough: CXR ordered. After results in will then tx cough. ? If pt could be aspirating and need a swallow eval  Constipation: Will increase dulcolax po to 10mg  daily, cont BID miralax and will add senna 2 tabs Qhs. He has been taking opioids daily at home  Palliative Prophylaxis:   Bowel Regimen, Frequent Pain Assessment and Turn Reposition   Psycho-social/Spiritual:  Support System: Strong Desire for further Chaplaincy support:no Prognosis: Would not be surprised if pt had a prognosis of 6 months or less with ESRD on HD in setting of severe  protein calorie malnutrition with albumin of 1.4; decreasing functional status. I did not introduce the concept of hospice at this meeting  Discharge Planning: Unknown at this point. He is very weak and would benefit from rehab but not clear if he wold pursue this   Chief Complaint/ Primary Diagnoses: Present on Admission:  . Lower gastrointestinal bleeding . Sjogren's disease ([place]) . Rectal bleeding . Physical deconditioning  I have reviewed the medical record, interviewed the patient and family, and examined  the patient. The following aspects are pertinent.  Past Medical History  Diagnosis Date  . Medical history non-contributory   . Arthritis   . Pneumonia   . GERD (gastroesophageal reflux disease)   . Sjogren's disease ([place]) 01/28/2015  . Pulmonary fibrosis ([place])   . 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 Received plasma exchange and steroids but didn't respond and ended up starting hemodialysis June 2016.   Marland Kitchen Acute on chronic diastolic CHF (congestive heart failure) ([place]) 04/18/2015  . Accelerated hypertension 04/19/2015   Social History   Social History  . Marital Status: Married    Spouse Name: N/A  . Number of Children: N/A  . Years of Education: N/A   Social History Main Topics  . Smoking status: Never Smoker   . Smokeless tobacco: Never Used  . Alcohol Use: No  . Drug Use: No  . Sexual Activity: Not Asked   Other Topics Concern  . None   Social History Narrative   Family History  Problem Relation Age of Onset  . Hypertension Mother   . Healthy Father   . Hypertension Sister   . Hypertension Brother    Scheduled Meds: . aspirin EC  81 mg Oral Daily  . atorvastatin  40 mg Oral q1800  . carvedilol  3.125 mg Oral BID WC  . darbepoetin (ARANESP) injection - DIALYSIS  25 mcg Intravenous Q Fri-HD  . diphenhydrAMINE  25 mg Oral Once  . feeding supplement (NEPRO CARB STEADY)  237 mL Oral BID BM  . feeding supplement (PRO-STAT SUGAR FREE 64)  30 mL Oral BID  . hydroxychloroquine  200 mg Oral Daily  . multivitamin  1 tablet Oral QHS  . polyethylene glycol  17 g Oral BID  . senna  2 tablet Oral QHS  . sevelamer carbonate  1,600 mg Oral TID WC  . cyanocobalamin  500 mcg Oral Daily   Continuous Infusions:  PRN Meds:.acetaminophen **OR** acetaminophen, [START ON 08/04/2015] bisacodyl, guaiFENesin-dextromethorphan, HYDROcodone-acetaminophen, hydrOXYzine, ondansetron **OR** ondansetron (ZOFRAN)  IV Medications Prior to Admission:  Prior to Admission medications   Medication Sig Start Date End Date Taking? Authorizing Provider  aspirin EC 81 MG EC tablet Take 1 tablet (81 mg total) by mouth daily. 04/23/15  Yes Reyne Dumas, MD  atorvastatin (LIPITOR) 40 MG tablet Take 1 tablet (40 mg total) by mouth daily at 6 PM. 04/23/15  Yes Reyne Dumas, MD  carvedilol (COREG) 3.125 MG tablet TAKE 1 TABLET BY MOUTH 2 TIMES DAILY WITH A MEAL 06/20/15  Yes Darlin Coco, MD  cyanocobalamin 500 MCG tablet Take 1 tablet (500 mcg total) by mouth daily. 12/26/14  Yes Donne Hazel, MD  hydroxychloroquine (PLAQUENIL) 200 MG tablet Take 200 mg by mouth daily.   Yes Historical Provider, MD  HYDROXYZINE PAMOATE PO Take 25 mg by mouth at bedtime as needed. For sleep   Yes Historical Provider, MD  multivitamin (RENA-VIT) TABS  tablet Take 1 tablet by mouth daily.   Yes Historical Provider, MD  sevelamer carbonate (RENVELA) 800 MG tablet Take 2 tablets (1,600 mg total) by mouth 3 (three) times daily with meals. 05/17/15  Yes Cherene Altes, MD   No Known Allergies  Review of Systems  Constitutional: Positive for activity change, appetite change and fatigue.  HENT: Negative.   Eyes: Negative.   Respiratory: Positive for cough.   Cardiovascular: Negative.   Endocrine: Positive for cold intolerance.  Genitourinary:       On HD  Neurological: Positive for weakness.  Psychiatric/Behavioral:       Withdrawn    Physical Exam  Constitutional: He is oriented to person, place, and time.  Frail cachetic  HENT:  Head: Normocephalic.  Neck: Normal range of motion.  Respiratory: Effort normal.  Constant moist productive cough  GI: Soft.  BS hyperactive C/o rectal pain  Neurological: He is alert and oriented to person, place, and time.  Skin: Skin is warm and dry.  Psychiatric:  Affect constricted, withdrawn. Pulled the covers up over his head    Vital Signs: BP 136/66 mmHg  Pulse 89  Temp(Src) 98.4  F (36.9 C) (Oral)  Resp 19  Ht 5\' 10"  (1.778 m)  Wt 63.73 kg (140 lb 8 oz)  BMI 20.16 kg/m2  SpO2 100%  SpO2: SpO2: 100 % O2 Device:SpO2: 100 % O2 Flow Rate: .O2 Flow Rate (L/min): 2 L/min  IO: Intake/output summary:  Intake/Output Summary (Last 24 hours) at 08/03/15 1210 Last data filed at 08/03/15 [id number]  Gross per 24 hour  Intake    720 ml  Output      0 ml  Net    720 ml    LBM: Last BM Date: 08/02/15 Baseline Weight: Weight: 55.9 kg (123 lb 3.8 oz) Most recent weight: Weight: 63.73 kg (140 lb 8 oz)      Palliative Assessment/Data:  Flowsheet Rows        Most Recent Value   Intake Tab    Referral Department  Hospitalist   Unit at Time of Referral  Med/Surg Unit   Palliative Care Primary Diagnosis  Nephrology   Date Notified  08/02/15   Palliative Care Type  Return patient Palliative Care   Reason for referral  Clarify Goals of Care   Date of Admission  07/28/15   Date first seen by Palliative Care  08/03/15   # of days Palliative referral response time  1 Day(s)   # of days IP prior to Palliative referral  5   Clinical Assessment    Palliative Performance Scale Score  30%   Pain Max last 24 hours  7   Pain Min Last 24 hours  2   Dyspnea Max Last 24 Hours  0   Dyspnea Min Last 24 hours  0   Nausea Max Last 24 Hours  0   Nausea Min Last 24 Hours  0   Anxiety Max Last 24 Hours  0   Anxiety Min Last 24 Hours  0   Other Max Last 24 Hours  0   Psychosocial & Spiritual Assessment    Social Work Plan of Care  Participated in Family meeting, Clarified patient/family wishes with healthcare team   Palliative Care Outcomes    Patient/Family meeting held?  Yes   Palliative Care Outcomes  Clarified goals of care, Completed durable DNR   Palliative Care follow-up planned  Yes, Facility      Additional Data Reviewed:  CBC:    Component Value Date/Time   WBC 9.5 07/30/2015 1502   HGB 11.1* 07/30/2015 1502   HCT 36.6* 07/30/2015 1502   PLT 164 07/30/2015 1502    MCV 90.8 07/30/2015 1502   NEUTROABS 7.3 07/28/2015 0144   LYMPHSABS 0.8 07/28/2015 0144   MONOABS 1.4* 07/28/2015 0144   EOSABS 0.2 07/28/2015 0144   BASOSABS 0.1 07/28/2015 0144   Comprehensive Metabolic Panel:    Component Value Date/Time   NA 133* 08/02/2015 0426   K 3.7 08/02/2015 0426   CL 96* 08/02/2015 0426   CO2 31 08/02/2015 0426   BUN 19 08/02/2015 0426   CREATININE 3.93* 08/02/2015 0426   GLUCOSE 97 08/02/2015 0426   CALCIUM 7.8* 08/02/2015 0426   AST 32 07/28/2015 0144   ALT 17 07/28/2015 0144   ALKPHOS 110 07/28/2015 0144   BILITOT 0.2* 07/28/2015 0144   PROT 5.2* 07/28/2015 0144   ALBUMIN 1.4* 07/30/2015 1502     Time In: 0930 Time Out: 1115 Time Total: 105 min Greater than 50%  of this time was spent counseling and coordinating care related to the above assessment and plan. Staffed with Dr. Jonnie Finner  Signed by: Dory Horn, NP  Dory Horn, NP  08/03/2015, 12:10 PM  Please contact Palliative Medicine Team phone at [phone] for questions and concerns.

## 2015-08-03 NOTE — Social Work (Signed)
CSW attempted to follow up with patient to provide bed offers. Patient and wife stating that they are waiting to see outcomes of surgical procedure on Monday 08/04/15 before deciding about follow up care. CSW will continue to follow and support as needed.  Christene Lye MSW, [place]

## 2015-08-03 NOTE — Progress Notes (Addendum)
[facility] Progress Note  Assessment: 1. LGIB/ fecal impaction - per primary/ GI. For flex sig on Mon. S/p enemas 2. Cough - sounds infectious, less likely vol related. Have ordered CXR 3. ESRD -HD Monday 4. HTN - low dose coreg 5. Volume - is 1kg under dry wt 6. Anemia - 11.1 stable - due for redose on ESA 11/16 - redose low dose ESA today at 25 Aranesp 7. Metabolic bone disease - Continue Renvela as ordered. Ca 7.9 C Ca 9.9  8. Malnuutrition - Albumin 1.4 Renal diet./vits/Prostat, add Nepro   9. Sjogren's disease: Cont. Hydroxychloroquine.  Plan -  HD Monday on sched, get CXR now, earlier HD today if wet  Kelly Splinter MD [place] pager [phone]    cell [phone] 08/03/2015, 10:51 AM    Subjective:     Objective Filed Vitals:   08/02/15 2127 08/03/15 0443 08/03/15 0623 08/03/15 0921  BP: 132/78 122/63  136/66  Pulse: 86 90  89  Temp: 98.2 F (36.8 C) 98.4 F (36.9 C)    TempSrc: Oral Oral    Resp: 19 19    Height:      Weight:   78.8 kg (173 lb 11.6 oz)   SpO2: 98% 100%  100%   Physical Exam General: alert, frail elderly BM Heart: RRR no RG Lungs: +rhonchi bilat, no basilar rales Abdomen: soft, ntnd no ascites Extremities: no LE or UE edema, cachectic, skin hardening diffusely upper and lower ext/ torso Dialysis Access: right lower AVGG  Dialysis Orders: MWF [facility] 4h 64.5 kg 3/2.0 bath RFA AVG Heparin 6000 Mircera 75 mcg (Last dose 07/16/15)- last outpt Hgb 12 Venofer 50 mg IV weekly (dose to start this week-recent Fe load in HD unit)   Additional Objective Labs: Basic Metabolic Panel:  Recent Labs Lab 07/30/15 0517 07/30/15 1502 08/02/15 0426  NA 139 135 133*  K 4.6 4.4 3.7  CL 101 100* 96*  CO2 31 29 31   GLUCOSE 84 79 97  BUN 14 15 19   CREATININE 4.99* 5.05* 3.93*  CALCIUM 8.0* 7.8* 7.8*  PHOS  --  3.6  --    Liver Function Tests:  Recent Labs Lab 07/28/15 0144 07/30/15 1502  AST 32  --    ALT 17  --   ALKPHOS 110  --   BILITOT 0.2*  --   PROT 5.2*  --   ALBUMIN 1.5* 1.4*    Recent Labs Lab 07/28/15 0144  LIPASE 30   CBC:  Recent Labs Lab 07/28/15 0144 07/30/15 0517 07/30/15 1502  WBC 9.8 9.2 9.5  NEUTROABS 7.3  --   --   HGB 11.7* 11.0* 11.1*  HCT 39.7 36.2* 36.6*  MCV 91.3 91.0 90.8  PLT 140* 163 164   Studies/Results: No results found. Medications:   . aspirin EC  81 mg Oral Daily  . atorvastatin  40 mg Oral q1800  . carvedilol  3.125 mg Oral BID WC  . darbepoetin (ARANESP) injection - DIALYSIS  25 mcg Intravenous Q Fri-HD  . diphenhydrAMINE  25 mg Oral Once  . feeding supplement (NEPRO CARB STEADY)  237 mL Oral BID BM  . feeding supplement (PRO-STAT SUGAR FREE 64)  30 mL Oral BID  . hydroxychloroquine  200 mg Oral Daily  . multivitamin  1 tablet Oral QHS  . polyethylene glycol  17 g Oral BID  . senna  2 tablet Oral QHS  . sevelamer carbonate  1,600 mg Oral TID WC  . cyanocobalamin  500 mcg Oral Daily

## 2015-08-03 NOTE — Progress Notes (Signed)
ANTIBIOTIC CONSULT NOTE - INITIAL  Pharmacy Consult for levofloxacin Indication: HCAP  No Known Allergies  Patient Measurements: Height: 5\' 10"  (177.8 cm) Weight: 140 lb 8 oz (63.73 kg) IBW/kg (Calculated) : 73  Vital Signs: Temp: 98.4 F (36.9 C) (11/20 0443) Temp Source: Oral (11/20 0443) BP: 136/66 mmHg (11/20 0921) Pulse Rate: 89 (11/20 0921) Intake/Output from previous day: 11/19 0701 - 11/20 0700 In: 600 [P.O.:600] Out: -  Intake/Output from this shift: Total I/O In: 240 [P.O.:240] Out: -   Labs:  Recent Labs  08/02/15 0426  CREATININE 3.93*   Estimated Creatinine Clearance: 15.1 mL/min (by C-G formula based on Cr of 3.93). No results for input(s): VANCOTROUGH, VANCOPEAK, VANCORANDOM, GENTTROUGH, GENTPEAK, GENTRANDOM, TOBRATROUGH, TOBRAPEAK, TOBRARND, AMIKACINPEAK, AMIKACINTROU, AMIKACIN in the last 72 hours.   Microbiology: Recent Results (from the past 720 hour(s))  MRSA PCR Screening     Status: None   Collection Time: 07/28/15  7:38 PM  Result Value Ref Range Status   MRSA by PCR NEGATIVE NEGATIVE Final    Comment:        The GeneXpert MRSA Assay (FDA approved for NASAL specimens only), is one component of a comprehensive MRSA colonization surveillance program. It is not intended to diagnose MRSA infection nor to guide or monitor treatment for MRSA infections.     Medical History: Past Medical History  Diagnosis Date  . Medical history non-contributory   . Arthritis   . Pneumonia   . GERD (gastroesophageal reflux disease)   . Sjogren's disease ([place]) 01/28/2015  . Pulmonary fibrosis ([place])   . 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 Received plasma exchange and steroids but didn't respond and ended up starting hemodialysis June 2016.   Marland Kitchen Acute on chronic diastolic CHF (congestive heart failure) ([place]) 04/18/2015  . Accelerated hypertension 04/19/2015     Assessment: 74 year old male admitted 07/28/2015 with history of ESRD (MWF schedule) with cough and concern for pneumonia. Chest x-ray shows some consolidation in the left lung base. Pharmacy consulted to levofloxacin. Patient has ESRD with planned HD tomorrow 11/20. WBC wnl, Afebrile.   Goal of Therapy:  Eradication of infection  Plan:  - Levofloxacin 750 mg IV x1 loading dose - Then  Levofloxacin 500 mg q48h - Monitor C&S, CBC and clinical progression  Dimitri Ped, PharmD. PGY-1 Pharmacy Resident Pager: [phone]  08/03/2015,1:17 PM

## 2015-08-03 NOTE — Progress Notes (Signed)
[name]  Travis [name] [id number] DOB: 02-01-1941 DOA: 07/28/2015 PCP: [name], MD   Brief narrative 74 year old male with a history of ESRD (MWF), systolic and diastolic CHF, recent hospitalization in 05/2015 for acute on chronic respiratory failure secondary to volume overload, hypertension, Sjogren syndrome, chronic respiratory failure on 2 L presented with three-day history of rectal bleeding, constipation, and rectal pain. The patient had given himself enemas 2 at home. He had some watery stool without any solid stool. Soapsuds enema was given after admission without significant improvement. CT of the abdomen and pelvis after admission revealed a stool ball in rectum with rectal wall thickening. Hgb has remained stable. GI and nephrology were consulted.      Assessment/Plan: Hematochezia/Proctitis with severe consitpation -07/28/2015 CT abdomen pelvis--stool ball and rectum with rectal wall thickening, moderate bilateral pleural effusions improved versus 05/16/2015 -Multiple rounds of daily enemas and scheduled MiraLAX without much help. Manual disimpaction unsuccessful yesterday. Will ask nursing to attempt again today. GI plan on flexible sigmoidoscopy on 11/21.  Pulmonary fibrosis/Chronic respiratory failure -Per PCCM note from 5/17 patient requires follow-up HRCT. HRCT obtained 05/16/15 and is currently without specific evidence of interstitial lung disease though it's utility was limited somewhat by atelectasis and current bronchiectasis as well as pleural effusions  - repeat HRCT is suggested in 12 months if concerns for pulmonary fibrosis persist. -on 2L at home.  -having congestive cough. CXR showing possible left lung base consolidation. ? Aspiration. Will add levaquin. Ordered swallow evaluation.   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 On scheduled HD  HD associated Hypotension  -BP was  stable with dialysis during this admission  Sjogren's disease - Scleroderma  -In May 2016 patient was discharged by [facility] outpatient on prednisone 5 mg daily however MAR does not show patient as currently being on steroid -Continue Plaquenil 200 mg daily -no convincing evidence of lung involvement per HRCT chest.  Chronic systolic and diastolic CHF - compensated -TTE 04/20/15-- w/ EF 35-40% - volume management per HD  History of DVT  -not on anticoagulation due to prior history of renal hematoma - s/p IVC filter 02/2015   Protein-calorie malnutrition, severe -avoiding Marinol due to likelihood of causing severe confusion in this patient. 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   HPI/Subjective: Seen and examined. Having non productive cough. Still no BM.   Objective: Filed Vitals:   08/03/15 0443 08/03/15 0921  BP: 122/63 136/66  Pulse: 90 89  Temp: 98.4 F (36.9 C)   Resp: 19     Intake/Output Summary (Last 24 hours) at 08/03/15 1335 Last data filed at 08/03/15 0854  Gross per 24 hour  Intake    600 ml  Output      0 ml  Net    600 ml   Filed Weights   08/02/15 0527 08/03/15 0623 08/03/15 1100  Weight: 82.6 kg (182 lb 1.6 oz) 78.8 kg (173 lb 11.6 oz) 63.73 kg (140 lb 8 oz)    Exam:   General:   not in distress  HEENT:  moist oral mucosa  Chest: coarse breath sounds b/l  CVS: Normal S1 and S2, no murmurs  GI: Soft,  nondistended, nontender  Musculoskeletal: Warm, no edema, rt AV fistula  CNS: Alert and awake, has episodic confusion which as per wife is baseline    Data Reviewed: Basic Metabolic Panel:  Recent  Labs Lab 07/28/15 0144 07/29/15 0622 07/30/15 0517 07/30/15 1502 08/02/15 0426  NA 137 135 139 135 133*  K 5.3* 5.4* 4.6 4.4 3.7  CL 102 101 101 100* 96*  CO2 26 27 31 29 31   GLUCOSE 87 63* 84 79 97  BUN 21* 27* 14 15 19   CREATININE 6.02* 7.11* 4.99* 5.05* 3.93*  CALCIUM 8.0* 7.9* 8.0* 7.8* 7.8*  PHOS  --   --   --  3.6  --    Liver Function Tests:  Recent Labs Lab 07/28/15 0144 07/30/15 1502  AST 32  --   ALT 17  --   ALKPHOS 110  --   BILITOT 0.2*  --   PROT 5.2*  --   ALBUMIN 1.5* 1.4*    Recent Labs Lab 07/28/15 0144  LIPASE 30   No results for input(s): AMMONIA in the last 168 hours. CBC:  Recent Labs Lab 07/28/15 0144 07/30/15 0517 07/30/15 1502  WBC 9.8 9.2 9.5  NEUTROABS 7.3  --   --   HGB 11.7* 11.0* 11.1*  HCT 39.7 36.2* 36.6*  MCV 91.3 91.0 90.8  PLT 140* 163 164   Cardiac Enzymes: No results for input(s): CKTOTAL, CKMB, CKMBINDEX, TROPONINI in the last 168 hours. BNP (last 3 results)  Recent Labs  01/06/15 1758 04/18/15 1626 05/14/15 0130  BNP 83.3 >4500.0* >4500.0*    ProBNP (last 3 results) No results for input(s): PROBNP in the last 8760 hours.  CBG: No results for input(s): GLUCAP in the last 168 hours.  Recent Results (from the past 240 hour(s))  MRSA PCR Screening     Status: None   Collection Time: 07/28/15  7:38 PM  Result Value Ref Range Status   MRSA by PCR NEGATIVE NEGATIVE Final    Comment:        The GeneXpert MRSA Assay (FDA approved for NASAL specimens only), is one component of a comprehensive MRSA colonization surveillance program. It is not intended to diagnose MRSA infection nor to guide or monitor treatment for MRSA infections.      Studies: Dg Chest Port 1 View  08/03/2015  CLINICAL DATA:  Cough and shortness of breath. End-stage renal disease. EXAM: PORTABLE CHEST 1 VIEW COMPARISON:  05/26/2015 and 05/15/2015 FINDINGS: There is new atelectasis or consolidation at the left lung base posterior  medially. Possible small left effusion. Probable small right effusion. Heart size and pulmonary vascularity are normal. Slight interstitial accentuation at the lung bases may represent pulmonary edema. IMPRESSION: Small bilateral pleural effusions with new atelectasis or consolidation at the left lung base. Probable mild interstitial pulmonary edema. Electronically Signed   By: Lorriane Shire M.D.   On: 08/03/2015 12:35    Scheduled Meds: . aspirin EC  81 mg Oral Daily  . atorvastatin  40 mg Oral q1800  . carvedilol  3.125 mg Oral BID WC  . darbepoetin (ARANESP) injection - DIALYSIS  25 mcg Intravenous Q Fri-HD  . diphenhydrAMINE  25 mg Oral Once  . feeding supplement (NEPRO CARB STEADY)  237 mL Oral BID BM  . feeding supplement (PRO-STAT SUGAR FREE 64)  30 mL Oral BID  . hydroxychloroquine  200 mg Oral Daily  . [START ON 08/05/2015] levofloxacin  500 mg Oral Q48H  . levofloxacin  750 mg Oral Once  .  multivitamin  1 tablet Oral QHS  . polyethylene glycol  17 g Oral BID  . senna  2 tablet Oral QHS  . sevelamer carbonate  1,600 mg Oral TID WC  . cyanocobalamin  500 mcg Oral Daily   Continuous Infusions:     Time spent: 25 minutes    Labaron Digirolamo  [facility] Pager [phone] If 7PM-7AM, please contact night-coverage at [email or web address], password [facility] 08/03/2015, 1:35 PM  LOS: 6 days

## 2015-08-04 ENCOUNTER — Encounter (HOSPITAL_COMMUNITY)
Admission: EM | Disposition: A | Discharge status: Skilled Nursing Facility | Payer: Self-pay | Source: Home / Self Care | Attending: Internal Medicine

## 2015-08-04 ENCOUNTER — Encounter (HOSPITAL_COMMUNITY): Payer: Self-pay | Admitting: *Deleted

## 2015-08-04 DIAGNOSIS — K6289 Other specified diseases of anus and rectum: Secondary | ICD-10-CM

## 2015-08-04 DIAGNOSIS — J9611 Chronic respiratory failure with hypoxia: Secondary | ICD-10-CM | POA: Diagnosis not present

## 2015-08-04 DIAGNOSIS — N186 End stage renal disease: Secondary | ICD-10-CM | POA: Diagnosis not present

## 2015-08-04 DIAGNOSIS — K5641 Fecal impaction: Secondary | ICD-10-CM | POA: Diagnosis not present

## 2015-08-04 DIAGNOSIS — M3500 Sicca syndrome, unspecified: Secondary | ICD-10-CM

## 2015-08-04 DIAGNOSIS — E43 Unspecified severe protein-calorie malnutrition: Secondary | ICD-10-CM

## 2015-08-04 DIAGNOSIS — K625 Hemorrhage of anus and rectum: Secondary | ICD-10-CM

## 2015-08-04 HISTORY — PX: FLEXIBLE SIGMOIDOSCOPY: SHX5431

## 2015-08-04 LAB — CBC
HEMATOCRIT: 35.9 % — AB (ref 39.0–52.0)
Hemoglobin: 11 g/dL — ABNORMAL LOW (ref 13.0–17.0)
MCH: 27.7 pg (ref 26.0–34.0)
MCHC: 30.6 g/dL (ref 30.0–36.0)
MCV: 90.4 fL (ref 78.0–100.0)
Platelets: 162 10*3/uL (ref 150–400)
RBC: 3.97 MIL/uL — ABNORMAL LOW (ref 4.22–5.81)
RDW: 18.1 % — AB (ref 11.5–15.5)
WBC: 8.8 10*3/uL (ref 4.0–10.5)

## 2015-08-04 LAB — GLUCOSE, CAPILLARY
GLUCOSE-CAPILLARY: 63 mg/dL — AB (ref 65–99)
Glucose-Capillary: 72 mg/dL (ref 65–99)
Glucose-Capillary: 70 mg/dL (ref 65–99)

## 2015-08-04 LAB — RENAL FUNCTION PANEL
ALBUMIN: 1.4 g/dL — AB (ref 3.5–5.0)
Anion gap: 7 (ref 5–15)
BUN: 42 mg/dL — AB (ref 6–20)
CALCIUM: 7.9 mg/dL — AB (ref 8.9–10.3)
CO2: 28 mmol/L (ref 22–32)
CREATININE: 6.06 mg/dL — AB (ref 0.61–1.24)
Chloride: 96 mmol/L — ABNORMAL LOW (ref 101–111)
GFR calc Af Amer: 10 mL/min — ABNORMAL LOW (ref >60)
GFR, EST NON AFRICAN AMERICAN: 8 mL/min — AB (ref >60)
GLUCOSE: 66 mg/dL (ref 65–99)
PHOSPHORUS: 4.7 mg/dL — AB (ref 2.5–4.6)
Potassium: 4.6 mmol/L (ref 3.5–5.1)
SODIUM: 131 mmol/L — AB (ref 135–145)

## 2015-08-04 SURGERY — SIGMOIDOSCOPY, FLEXIBLE
Anesthesia: Moderate Sedation

## 2015-08-04 MED ORDER — BISACODYL 5 MG PO TBEC: 10.0000 mg | DELAYED_RELEASE_TABLET | Freq: Every day | ORAL | Status: DC | PRN

## 2015-08-04 MED ORDER — MIDAZOLAM HCL 10 MG/2ML IJ SOLN
INTRAMUSCULAR | Status: DC | PRN
  Administered 2015-08-04: 2 mg via INTRAVENOUS
  Administered 2015-08-04: .5 mg via INTRAVENOUS

## 2015-08-04 MED ORDER — NEPRO/CARBSTEADY PO LIQD: 237.0000 mL | Freq: Two times a day (BID) | ORAL | Status: DC

## 2015-08-04 MED ORDER — SODIUM CHLORIDE 0.9 % IV SOLN: INTRAVENOUS | Status: DC

## 2015-08-04 MED ORDER — PENTAFLUOROPROP-TETRAFLUOROETH EX AERO: 1.0000 "application " | INHALATION_SPRAY | CUTANEOUS | Status: DC | PRN

## 2015-08-04 MED ORDER — HEPARIN SODIUM (PORCINE) 1000 UNIT/ML DIALYSIS: 1000.0000 [IU] | INTRAMUSCULAR | Status: DC | PRN

## 2015-08-04 MED ORDER — SODIUM CHLORIDE 0.9 % IV SOLN: 100.0000 mL | INTRAVENOUS | Status: DC | PRN

## 2015-08-04 MED ORDER — ALTEPLASE 2 MG IJ SOLR
2.0000 mg | Freq: Once | INTRAMUSCULAR | Status: DC | PRN
Start: 2015-08-04 — End: 2015-08-04
  Filled 2015-08-04: qty 2

## 2015-08-04 MED ORDER — SODIUM CHLORIDE 0.9 % IV SOLN
INTRAVENOUS | Status: DC
  Administered 2015-08-04: 500 mL via INTRAVENOUS

## 2015-08-04 MED ORDER — FENTANYL CITRATE (PF) 100 MCG/2ML IJ SOLN
INTRAMUSCULAR | Status: DC | PRN
  Administered 2015-08-04: 25 ug via INTRAVENOUS

## 2015-08-04 MED ORDER — LEVOFLOXACIN 500 MG PO TABS: 500.0000 mg | ORAL_TABLET | ORAL | Status: DC

## 2015-08-04 MED ORDER — SENNA 8.6 MG PO TABS: 2.0000 | ORAL_TABLET | Freq: Every day | ORAL | Status: DC

## 2015-08-04 MED ORDER — DIPHENHYDRAMINE HCL 50 MG/ML IJ SOLN
INTRAMUSCULAR | Status: AC
  Filled 2015-08-04: qty 1

## 2015-08-04 MED ORDER — MIDAZOLAM HCL 5 MG/ML IJ SOLN
INTRAMUSCULAR | Status: AC
  Filled 2015-08-04: qty 2

## 2015-08-04 MED ORDER — DEXTROSE 50 % IV SOLN
INTRAVENOUS | Status: AC
  Administered 2015-08-04: 09:00:00
  Filled 2015-08-04: qty 50

## 2015-08-04 MED ORDER — FENTANYL CITRATE (PF) 100 MCG/2ML IJ SOLN
INTRAMUSCULAR | Status: AC
  Filled 2015-08-04: qty 2

## 2015-08-04 MED ORDER — LIDOCAINE-PRILOCAINE 2.5-2.5 % EX CREA: 1.0000 "application " | TOPICAL_CREAM | CUTANEOUS | Status: DC | PRN

## 2015-08-04 MED ORDER — GUAIFENESIN-DM 100-10 MG/5ML PO SYRP: 5.0000 mL | ORAL_SOLUTION | ORAL | Status: DC | PRN

## 2015-08-04 MED ORDER — LIDOCAINE HCL (PF) 1 % IJ SOLN: 5.0000 mL | INTRAMUSCULAR | Status: DC | PRN

## 2015-08-04 MED ORDER — PRO-STAT SUGAR FREE PO LIQD: 30.0000 mL | Freq: Two times a day (BID) | ORAL | Status: DC

## 2015-08-04 MED ORDER — POLYETHYLENE GLYCOL 3350 17 G PO PACK: 17.0000 g | PACK | Freq: Two times a day (BID) | ORAL | Status: DC

## 2015-08-04 NOTE — H&P (View-Only) (Signed)
[name]  Bernest Nies [id number] DOB: 06-Dec-1940 DOA: 07/28/2015 PCP: [name], MD   Brief narrative 74 year old male with a history of ESRD (MWF), systolic and diastolic CHF, recent hospitalization in 05/2015 for acute on chronic respiratory failure secondary to volume overload, hypertension, Sjogren syndrome, chronic respiratory failure on 2 L presented with three-day history of rectal bleeding, constipation, and rectal pain. The patient had given himself enemas 2 at home. He had some watery stool without any solid stool. Soapsuds enema was given after admission without significant improvement. CT of the abdomen and pelvis after admission revealed a stool ball in rectum with rectal wall thickening. Hgb has remained stable. GI and nephrology were consulted.      Assessment/Plan: Hematochezia/Proctitis with severe consitpation -07/28/2015 CT abdomen pelvis--stool ball and rectum with rectal wall thickening, moderate bilateral pleural effusions improved versus 05/16/2015 -Multiple rounds of daily enemas and scheduled MiraLAX without much help. Manual disimpaction unsuccessful yesterday. Will ask nursing to attempt again today. GI plan on flexible sigmoidoscopy on 11/21.  Pulmonary fibrosis/Chronic respiratory failure -Per PCCM note from 5/17 patient requires follow-up HRCT. HRCT obtained 05/16/15 and is currently without specific evidence of interstitial lung disease though it's utility was limited somewhat by atelectasis and current bronchiectasis as well as pleural effusions  - repeat HRCT is suggested in 12 months if concerns for pulmonary fibrosis persist. -on 2L at home.  -having congestive cough. CXR showing possible left lung base consolidation. ? Aspiration. Will add levaquin. Ordered swallow evaluation.   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 On scheduled HD  HD associated Hypotension  -BP was  stable with dialysis during this admission  Sjogren's disease - Scleroderma  -In May 2016 patient was discharged by [facility] outpatient on prednisone 5 mg daily however MAR does not show patient as currently being on steroid -Continue Plaquenil 200 mg daily -no convincing evidence of lung involvement per HRCT chest.  Chronic systolic and diastolic CHF - compensated -TTE 04/20/15-- w/ EF 35-40% - volume management per HD  History of DVT  -not on anticoagulation due to prior history of renal hematoma - s/p IVC filter 02/2015   Protein-calorie malnutrition, severe -avoiding Marinol due to likelihood of causing severe confusion in this patient. 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   HPI/Subjective: Seen and examined. Having non productive cough. Still no BM.   Objective: Filed Vitals:   08/03/15 0443 08/03/15 0921  BP: 122/63 136/66  Pulse: 90 89  Temp: 98.4 F (36.9 C)   Resp: 19     Intake/Output Summary (Last 24 hours) at 08/03/15 1335 Last data filed at 08/03/15 0854  Gross per 24 hour  Intake    600 ml  Output      0 ml  Net    600 ml   Filed Weights   08/02/15 0527 08/03/15 0623 08/03/15 1100  Weight: 82.6 kg (182 lb 1.6 oz) 78.8 kg (173 lb 11.6 oz) 63.73 kg (140 lb 8 oz)    Exam:   General:   not in distress  HEENT:  moist oral mucosa  Chest: coarse breath sounds b/l  CVS: Normal S1 and S2, no murmurs  GI: Soft,  nondistended, nontender  Musculoskeletal: Warm, no edema, rt AV fistula  CNS: Alert and awake, has episodic confusion which as per wife is baseline    Data Reviewed: Basic Metabolic Panel:  Recent  Labs Lab 07/28/15 0144 07/29/15 0622 07/30/15 0517 07/30/15 1502 08/02/15 0426  NA 137 135 139 135 133*  K 5.3* 5.4* 4.6 4.4 3.7  CL 102 101 101 100* 96*  CO2 26 27 31 29 31   GLUCOSE 87 63* 84 79 97  BUN 21* 27* 14 15 19   CREATININE 6.02* 7.11* 4.99* 5.05* 3.93*  CALCIUM 8.0* 7.9* 8.0* 7.8* 7.8*  PHOS  --   --   --  3.6  --    Liver Function Tests:  Recent Labs Lab 07/28/15 0144 07/30/15 1502  AST 32  --   ALT 17  --   ALKPHOS 110  --   BILITOT 0.2*  --   PROT 5.2*  --   ALBUMIN 1.5* 1.4*    Recent Labs Lab 07/28/15 0144  LIPASE 30   No results for input(s): AMMONIA in the last 168 hours. CBC:  Recent Labs Lab 07/28/15 0144 07/30/15 0517 07/30/15 1502  WBC 9.8 9.2 9.5  NEUTROABS 7.3  --   --   HGB 11.7* 11.0* 11.1*  HCT 39.7 36.2* 36.6*  MCV 91.3 91.0 90.8  PLT 140* 163 164   Cardiac Enzymes: No results for input(s): CKTOTAL, CKMB, CKMBINDEX, TROPONINI in the last 168 hours. BNP (last 3 results)  Recent Labs  01/06/15 1758 04/18/15 1626 05/14/15 0130  BNP 83.3 >4500.0* >4500.0*    ProBNP (last 3 results) No results for input(s): PROBNP in the last 8760 hours.  CBG: No results for input(s): GLUCAP in the last 168 hours.  Recent Results (from the past 240 hour(s))  MRSA PCR Screening     Status: None   Collection Time: 07/28/15  7:38 PM  Result Value Ref Range Status   MRSA by PCR NEGATIVE NEGATIVE Final    Comment:        The GeneXpert MRSA Assay (FDA approved for NASAL specimens only), is one component of a comprehensive MRSA colonization surveillance program. It is not intended to diagnose MRSA infection nor to guide or monitor treatment for MRSA infections.      Studies: Dg Chest Port 1 View  08/03/2015  CLINICAL DATA:  Cough and shortness of breath. End-stage renal disease. EXAM: PORTABLE CHEST 1 VIEW COMPARISON:  05/26/2015 and 05/15/2015 FINDINGS: There is new atelectasis or consolidation at the left lung base posterior  medially. Possible small left effusion. Probable small right effusion. Heart size and pulmonary vascularity are normal. Slight interstitial accentuation at the lung bases may represent pulmonary edema. IMPRESSION: Small bilateral pleural effusions with new atelectasis or consolidation at the left lung base. Probable mild interstitial pulmonary edema. Electronically Signed   By: Lorriane Shire M.D.   On: 08/03/2015 12:35    Scheduled Meds: . aspirin EC  81 mg Oral Daily  . atorvastatin  40 mg Oral q1800  . carvedilol  3.125 mg Oral BID WC  . darbepoetin (ARANESP) injection - DIALYSIS  25 mcg Intravenous Q Fri-HD  . diphenhydrAMINE  25 mg Oral Once  . feeding supplement (NEPRO CARB STEADY)  237 mL Oral BID BM  . feeding supplement (PRO-STAT SUGAR FREE 64)  30 mL Oral BID  . hydroxychloroquine  200 mg Oral Daily  . [START ON 08/05/2015] levofloxacin  500 mg Oral Q48H  . levofloxacin  750 mg Oral Once  .  multivitamin  1 tablet Oral QHS  . polyethylene glycol  17 g Oral BID  . senna  2 tablet Oral QHS  . sevelamer carbonate  1,600 mg Oral TID WC  . cyanocobalamin  500 mcg Oral Daily   Continuous Infusions:     Time spent: 25 minutes    Zailyn Thoennes  [facility] Pager [phone] If 7PM-7AM, please contact night-coverage at [email or web address], password [facility] 08/03/2015, 1:35 PM  LOS: 6 days

## 2015-08-04 NOTE — Progress Notes (Signed)
SLP Cancellation Note  Patient Details Name: Travis [name] MRN: [id number] DOB: 10/27/1940   Cancelled treatment:       Reason Eval/Treat Not Completed: Patient at procedure or test/unavailable. Patient NPO for procedure. Will f/u 11/22.  [place], CCC-SLP [phone]    Travis [name] 08/04/2015, 2:22 PM

## 2015-08-04 NOTE — Progress Notes (Signed)
Hemodialysis- Pt tolerated well. Rinsed back early with 10 minutes remaining d/t bp drop into 60s with altered mental status. Given additional saline. Post bolus bp =139/89, pt now alert and asymptomatic. Will pull needles and return to room.

## 2015-08-04 NOTE — Procedures (Signed)
Patient was seen on dialysis and the procedure was supervised. BFR 400 Via R ACVG BP is 116/71.  Patient appears to be tolerating treatment well, no new complaints

## 2015-08-04 NOTE — Discharge Summary (Addendum)
Physician Discharge Summary  Travis [name] [id number] DOB: 06-13-1941 DOA: 07/28/2015  PCP: [name], MD  Admit date: 07/28/2015 Discharge date: 08/04/2015  Time spent: 30 minutes  Recommendations for Outpatient Follow-up:  1. Discharge to skilled nursing facility. Please provide aggressive bowel regimen with schedule or as needed bowel enemas for stool impaction. 2. Completes antibiotics on 11/24. 3. Needs HRCT in 05/2016 for follow up on pulmonary fibrosis.   Discharge Diagnoses:  Principal Problem:   Rectal bleeding   Active Problems:   Physical deconditioning   Sjogren's disease ([place])   ESRD on dialysis ([place])   Protein-calorie malnutrition, severe (HCC)   Rectal pain ( [place])   Fecal impaction (HCC)   Chronic respiratory failure (HCC)   Cough  chronic systolic CHF   Acute bronchitis      Discharge Condition: guarded  Diet recommendation: renal   Filed Weights   08/03/15 1100 08/04/15 0729 08/04/15 1050  Weight: 63.73 kg (140 lb 8 oz) 64.2 kg (141 lb 8.6 oz) 62.8 kg (138 lb 7.2 oz)    History of present illness:  Please refer to admission H&P for details, in brief, 74 year old male with a history of ESRD (MWF), systolic and diastolic CHF, recent hospitalization in 05/2015 for acute on chronic respiratory failure secondary to volume overload, hypertension, Sjogren syndrome, chronic respiratory failure on 2 L presented with three-day history of rectal bleeding, constipation, and rectal pain. The patient had given himself enemas 2 at home. He had some watery stool without any solid stool. Soapsuds enema was given after admission without significant improvement. CT of the abdomen and pelvis after admission revealed a stool ball in rectum with rectal wall thickening. Hgb has remained stable. GI and nephrology were consulted.   Hospital Course:  Hematochezia/Proctitis with severe consitpation -07/28/2015 CT abdomen pelvis--stool ball and rectum with  rectal wall thickening. improved versus 05/16/2015 -Multiple rounds of daily enemas and scheduled MiraLAX with some improvement. -seen by GI and underwent flexible sigmoidoscopy today showing  Mild inflammation in rectum and distal sigmoid colon likely due to recent fecal impaction. No active bleeding -GI recommend frequent enemas and laxatives as needed. No further recommendations.  Pulmonary fibrosis/Chronic respiratory failure -Per PCCM note from 5/17 patient requires follow-up HRCT. HRCT obtained 05/16/15 and is currently without specific evidence of interstitial lung disease though it's utility was limited somewhat by atelectasis and current bronchiectasis as well as pleural effusions - repeat HRCT is suggested in 12 months if concerns for pulmonary fibrosis persist. -on 2L at home.  -having congestive cough. CXR showing possible left lung base consolidation, ?acute bronchitis vs aspiration PNA. Marland Kitchen added levaquin for a 5 day course.  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 On scheduled HD  HD associated Hypotension  -BP was stable with dialysis during this admission  Sjogren's disease - Scleroderma  -In May 2016 patient was discharged by [facility] outpatient on prednisone 5 mg daily however MAR does not show patient as currently being on steroid -Continue Plaquenil 200 mg daily -no convincing evidence of lung involvement per HRCT chest.  Chronic systolic and diastolic CHF - compensated -TTE 04/20/15-- w/ EF 35-40% - volume management per HD  History of DVT  -not on anticoagulation due to prior history of renal hematoma - s/p IVC filter 02/2015   Protein-calorie malnutrition, severe -avoiding Marinol due to likelihood of causing severe confusion in this patient. 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 palliative care team consulted again for [place] discussion. He agrees to be DNR. Wants to continue D until MD feel he can no longer tolerate dialysis. Also wants to continue appropriate care including hospitalization, antibiotics etc. Recommend palliative care  follow up at the facility.     Code Status: DNR ( after discussion with palliative care)   Family Communication: Wife at bedside Disposition Plan: SNF   Antibiotics: Levaquin 500 mg q 48 hrs until 11/24   Procedures:  CT abdomen Flexible sigmoidoscopy  Consult:  [facility]    Discharge Exam: Filed Vitals:   08/04/15 1432 08/04/15 1440  BP: 122/70 121/76  Pulse:    Temp: 97.7 F (36.5 C)   Resp: 21 8     General:elderly male  not in distress, appears fatigued  HEENT: moist oral mucosa  Chest: coarse breath sounds b/l  CVS: Normal S1 and S2, no murmurs  GI: Soft, nondistended, nontender  Musculoskeletal: Warm, no edema, rt AV fistula  CNS: Alert and awake, has episodic confusion which as per wife is baseline  Discharge Instructions    Current Discharge Medication List    START taking these medications   Details  Amino Acids-Protein Hydrolys (FEEDING SUPPLEMENT, PRO-STAT SUGAR FREE 64,) LIQD Take 30 mLs by mouth 2 (two) times daily. Qty: 900 mL, Refills: 0    bisacodyl (DULCOLAX) 5 MG EC tablet Take 2 tablets (10 mg total) by mouth daily as needed for moderate constipation. Qty: 30 tablet, Refills: 0    guaiFENesin-dextromethorphan (ROBITUSSIN DM) 100-10 MG/5ML syrup Take 5 mLs by mouth every 4 (four) hours as needed for cough. Qty: 118 mL, Refills: 0    levofloxacin (LEVAQUIN) 500 MG tablet Take 1 tablet (500 mg total) by mouth every other day. Qty: 2 tablet, Refills: 0    Nutritional Supplements (FEEDING SUPPLEMENT, NEPRO CARB STEADY,) LIQD Take 237 mLs by mouth 2 (two) times daily between meals. Qty: 60 Can, Refills: 0    polyethylene glycol (MIRALAX / GLYCOLAX) packet  Take 17 g by mouth 2 (two) times daily. Qty: 14 each, Refills: 0    senna (SENOKOT) 8.6 MG TABS tablet Take 2 tablets (17.2 mg total) by mouth at bedtime. Qty: 120 each, Refills: 0      CONTINUE these medications which have NOT CHANGED   Details  aspirin EC 81 MG EC tablet Take 1 tablet (81 mg total) by mouth daily. Qty: 30 tablet, Refills: 0    atorvastatin (LIPITOR) 40 MG tablet Take 1 tablet (40 mg total) by mouth daily at 6 PM. Qty: 30 tablet, Refills: 0    carvedilol (COREG) 3.125 MG tablet TAKE 1 TABLET BY MOUTH 2 TIMES DAILY WITH A MEAL Qty: 60 tablet, Refills: 5    cyanocobalamin 500 MCG tablet Take 1 tablet (500 mcg total) by mouth daily. Qty: 30 tablet, Refills: 0    hydroxychloroquine (PLAQUENIL) 200 MG tablet Take 200 mg by mouth daily.    HYDROXYZINE PAMOATE PO Take 25 mg by mouth at bedtime as needed. For sleep    multivitamin (RENA-VIT) TABS tablet Take 1 tablet by mouth daily.    sevelamer carbonate (RENVELA) 800 MG tablet Take 2 tablets (1,600 mg total) by mouth 3 (three) times daily with meals. Qty: 90 tablet, Refills: 0      STOP taking these medications     hydrOXYzine (ATARAX/VISTARIL) 10 MG tablet        No Known Allergies Follow-up Information  Please follow up.   Why:  MD at SNF      Please follow up.   Why:  [facility]       The results of significant diagnostics from this hospitalization (including imaging, microbiology, ancillary and laboratory) are listed below for reference.    Significant Diagnostic Studies: Ct Abdomen Pelvis Wo Contrast  07/28/2015  CLINICAL DATA:  Rectal pain.  Rectal bleeding for 3 days. EXAM: CT ABDOMEN AND PELVIS WITHOUT CONTRAST TECHNIQUE: Multidetector CT imaging of the abdomen and pelvis was performed following the standard protocol without IV contrast. COMPARISON:  CT 02/07/2015 FINDINGS: Lower chest: Small- moderate pleural effusions, left greater than right with adjacent compressive atelectasis,  improved from chest CT of 05/16/2015. Small pericardial effusion. Liver: No evidence of focal lesion allowing for lack contrast. Hepatobiliary: Gallbladder minimally distended, stones and sludge in the fundus. No biliary dilatation. Pancreas: No ductal dilatation or surrounding inflammation. Spleen: Normal. Adrenal glands: No nodule.  Mild thickening on the left. Kidneys: No hydronephrosis or right urolithiasis. Minimal stranding about the left kidney with near complete resolution of previous para renal hemorrhage. Stomach/Bowel: Stomach decompressed. There are no dilated or thickened small bowel loops. Diverticulosis in the distal colon without diverticulitis. There is a stool ball distending the rectum with circumferential rectal wall thickening. Small volume of stool throughout the remainder of the colon without colonic wall thickening. The appendix is normal. Vascular/Lymphatic: Infrarenal IVC filter in place. No retroperitoneal adenopathy. Abdominal aorta is normal in caliber. Reproductive: Prostate gland appears prominent in size. Bladder: Near completely decompressed. Other: Whole body wall and mild mesenteric edema. Fluid within both spermatic cord. Trace intra-abdominal ascites in the pericolic gutters. No free air. No intra-abdominal fluid collection. Musculoskeletal: There are no acute or suspicious osseous abnormalities. Degenerative change at L4-L5, stable in appearance from prior. Degenerative change involves both sacroiliac joints. IMPRESSION: 1. Circumferential rectal wall thickening with stool ball distending the rectum. Small volume of stool throughout the remainder of the colon. 2. Chronic pleural effusions, small to moderate in degree, left greater than right. This is improved from chest CT of 05/16/2015. 3. Probable gallstones and sludge in the gallbladder. 4. Additional chronic findings as described. Electronically Signed   By: Jeb Levering M.D.   On: 07/28/2015 05:43   Dg Abd 1  View  07/31/2015  CLINICAL DATA:  Abdominal pain EXAM: ABDOMEN - 1 VIEW COMPARISON:  CT abdomen and pelvis - 07/28/2015; 02/07/2015 FINDINGS: Nonobstructive bowel gas pattern.  Moderate colonic stool burden. Nondiagnostic evaluation for pneumoperitoneum secondary supine positioning and exclusion of the lower thorax. No pneumatosis or portal venous gas. Radiopaque debris is seen scattered throughout the colon. Faster calcifications overlie the lower pelvis bilaterally. No definitive abnormal intra-abdominal calcifications. Limited visualization of lower thorax demonstrates a small left and trace right-sided pleural effusions with associated bibasilar opacities. Post IVC filter placement. No acute osseus abnormalities. IMPRESSION: Moderate colonic stool burden without evidence of enteric obstruction. Electronically Signed   By: Sandi Mariscal M.D.   On: 07/31/2015 07:46   Dg Chest Port 1 View  08/03/2015  CLINICAL DATA:  Cough and shortness of breath. End-stage renal disease. EXAM: PORTABLE CHEST 1 VIEW COMPARISON:  05/26/2015 and 05/15/2015 FINDINGS: There is new atelectasis or consolidation at the left lung base posterior medially. Possible small left effusion. Probable small right effusion. Heart size and pulmonary vascularity are normal. Slight interstitial accentuation at the lung bases may represent pulmonary edema. IMPRESSION: Small bilateral pleural effusions with new atelectasis or consolidation at the  left lung base. Probable mild interstitial pulmonary edema. Electronically Signed   By: Lorriane Shire M.D.   On: 08/03/2015 12:35    Microbiology: Recent Results (from the past 240 hour(s))  MRSA PCR Screening     Status: None   Collection Time: 07/28/15  7:38 PM  Result Value Ref Range Status   MRSA by PCR NEGATIVE NEGATIVE Final    Comment:        The GeneXpert MRSA Assay (FDA approved for NASAL specimens only), is one component of a comprehensive MRSA colonization surveillance program. It  is not intended to diagnose MRSA infection nor to guide or monitor treatment for MRSA infections.      Labs: Basic Metabolic Panel:  Recent Labs Lab 07/29/15 0622 07/30/15 0517 07/30/15 1502 08/02/15 0426 08/04/15 0749  NA 135 139 135 133* 131*  K 5.4* 4.6 4.4 3.7 4.6  CL 101 101 100* 96* 96*  CO2 27 31 29 31 28   GLUCOSE 63* 84 79 97 66  BUN 27* 14 15 19  42*  CREATININE 7.11* 4.99* 5.05* 3.93* 6.06*  CALCIUM 7.9* 8.0* 7.8* 7.8* 7.9*  PHOS  --   --  3.6  --  4.7*   Liver Function Tests:  Recent Labs Lab 07/30/15 1502 08/04/15 0749  ALBUMIN 1.4* 1.4*   No results for input(s): LIPASE, AMYLASE in the last 168 hours. No results for input(s): AMMONIA in the last 168 hours. CBC:  Recent Labs Lab 07/30/15 0517 07/30/15 1502 08/04/15 0459  WBC 9.2 9.5 8.8  HGB 11.0* 11.1* 11.0*  HCT 36.2* 36.6* 35.9*  MCV 91.0 90.8 90.4  PLT 163 164 162   Cardiac Enzymes: No results for input(s): CKTOTAL, CKMB, CKMBINDEX, TROPONINI in the last 168 hours. BNP: BNP (last 3 results)  Recent Labs  01/06/15 1758 04/18/15 1626 05/14/15 0130  BNP 83.3 >4500.0* >4500.0*    ProBNP (last 3 results) No results for input(s): PROBNP in the last 8760 hours.  CBG:  Recent Labs Lab 08/04/15 0848 08/04/15 0905 08/04/15 1029  GLUCAP 63* 72 70       Signed:  Reon Hunley  [facility] 08/04/2015, 3:22 PM

## 2015-08-04 NOTE — Progress Notes (Signed)
Physical Therapy Treatment Patient Details Name: Travis [name] MRN: [id number] DOB: [birth year] Today's Date: 08/04/2015    History of Present Illness Pt adm with severe constipation. PMH - ESRD on HD, Sjogren's disease - Scleroderma, pulmonary fibrosis    PT Comments    Patient with busy day of procedures, yet willing to work with PT. With incr time, pt able to assist more with his bed mobility. Sitting balance improved (see below; also able to activate abd and back extensor muscles to shift his torso against resistance). Legs very weak (2+) and pt fatigued after EOB 5 minutes. Requested return to bed.   Follow Up Recommendations  SNF     Equipment Recommendations  None recommended by PT    Recommendations for Other Services       Precautions / Restrictions Precautions Precautions: Fall Restrictions Weight Bearing Restrictions: No    Mobility  Bed Mobility Overal bed mobility: Needs Assistance Bed Mobility: Rolling;Sidelying to Sit;Sit to Sidelying Rolling: Mod assist Sidelying to sit: Max assist     Sit to sidelying: Max assist;+2 for physical assistance General bed mobility comments: With incr time and tactile with vc pt able to assist with kicking legs over EOB and pushing on rail to raise torso and get up onto Rt elbow to further assist  Transfers                 General transfer comment: pt too weak to safely attempt  Ambulation/Gait             General Gait Details: Pt unable   Stairs            Wheelchair Mobility    Modified Rankin (Stroke Patients Only)       Balance Overall balance assessment: Needs assistance Sitting-balance support: No upper extremity supported;Feet supported Sitting balance-Leahy Scale: Zero Sitting balance - Comments: able to hold midline with bil UE support for up to 3 minutes; as he began to fatigue, loses balance posterriorly and requires assist to maintain upright                             Cognition Arousal/Alertness: Awake/alert Behavior During Therapy: Flat affect Overall Cognitive Status: No family/caregiver present to determine baseline cognitive functioning Area of Impairment: Problem solving       Following Commands: Follows one step commands with increased time     Problem Solving: Slow processing;Decreased initiation;Requires verbal cues;Requires tactile cues      Exercises General Exercises - Lower Extremity Long Arc Quad: AROM;Strengthening;Both;10 reps;Seated    General Comments        Pertinent Vitals/Pain Pain Assessment: No/denies pain    Home Living                      Prior Function            PT Goals (current goals can now be found in the care plan section) Acute Rehab PT Goals Patient Stated Goal: get stronger Time For Goal Achievement: 08/14/15 Progress towards PT goals: Progressing toward goals (slow with bed mob)    Frequency  Min 3X/week    PT Plan Current plan remains appropriate    Co-evaluation             End of Session Equipment Utilized During Treatment: Oxygen Activity Tolerance: Patient limited by fatigue (had HD this a.m.; just returned from sigmoidoscopy) Patient left: in bed;with call bell/phone within reach;with bed  alarm set;with family/visitor present     Time: [id number] PT Time Calculation (min) (ACUTE ONLY): 21 min  Charges:  $Therapeutic Activity: 8-22 mins                    G Codes:      Marika Mahaffy 08/26/2015, 3:35 PM 08/26/2015  Pager [phone]

## 2015-08-04 NOTE — Op Note (Signed)
[facility] [place], [id number]   FLEXIBLE SIGMOIDOSCOPY PROCEDURE REPORT  PATIENT: Travis [name], Travis [name]  MR#: [id number] BIRTHDATE: 02/08/1941 , 71  yrs. old GENDER: male ENDOSCOPIST: Wilford Corner, MD REFERRED BY: hospital team PROCEDURE DATE:  08/04/2015 PROCEDURE:   Sigmoidoscopy, diagnostic ASA CLASS:   Class III INDICATIONS:rectal bleeding. MEDICATIONS: Fentanyl 25 mcg IV and Versed 2.5 mg IV  DESCRIPTION OF PROCEDURE:   After the risks benefits and alternatives of the procedure were thoroughly explained, informed consent was obtained.     The     endoscope was introduced through the anus  and advanced to the descending colon , The exam was Without limitations.    The quality of the prep was The overall prep quality was poor. . Estimated blood loss is zero unless otherwise noted in this procedure report. The instrument was then slowly withdrawn as the mucosa was fully examined.       Large amount of semi-solid brown and tan-colored stool noted on rectal exam and during endoscopic evaluation. No active bleeding seen. Unable to completely visualize rectum due to solid stool noted. Mild erythema in rectum and sigmoid colon c/w inflammation likely due to recent fecal impaction.          Retroflexed views revealed unable to visualize anorectum due to stool products. The scope was then withdrawn from the patient and the procedure terminated.  COMPLICATIONS: There were no immediate complications.  ENDOSCOPIC IMPRESSION: Mild inflammation in rectum and distal sigmoid colon likely due to recent fecal impaction No active bleeding  RECOMMENDATIONS: Enemas; Laxatives prn; No further GI evaluation  REPEAT EXAM:  eSigned:  Wilford Corner, MD 08/04/2015 2:34 PM   CC:

## 2015-08-04 NOTE — Progress Notes (Addendum)
Patient was discharged to nursing home [facility] and Rehab) by MD order; discharged instructions  review and sent to facility with  care notes; IV DIC; AV fistula on RFA positive bruit/thril; patient will be transported to the facility via EMS. Facility was called and report was given to nurse Debroah Baller.

## 2015-08-04 NOTE — Brief Op Note (Signed)
No active bleeding. Nonspecific focal inflammation in rectum and distal sigmoid colon likely due to recent fecal impaction. Use Tap water enemas and laxatives prn. No further GI evaluation. Will sign off. Call if questions.

## 2015-08-04 NOTE — Progress Notes (Signed)
Patient will discharge to [place] #: [phone] Anticipated discharge date:08/04/15 Family notified:wife at bedside Transportation by [facility]- scheduled for 4:30pm  CSW signing off.  Domenica Reamer, [place] Social Worker [phone]

## 2015-08-04 NOTE — Care Management Note (Signed)
Case Management Note  Patient Details  Name: Travis [name] MRN: [id number] Date of Birth: 05/12/1941  Subjective/Objective:              Date-07-29-15 Initial Assessment Spoke with patient at the bedside along with wife Introduced self as case Freight forwarder and explained role in discharge planning and how to be reached.  Verified patient anticipates to go home with spouse, or SNF at time of discharge.  Patient has DME cane walker Oxygen at 2L through [facility], and bedside commode. Expressed potential need for no other DME.  Patient denied needing help with their medication. Gets some meds through [facility] and others through [place].  Verified patient has PCP. Patient states they currently receive [place] services through no one.    Plan: CM will continue to follow for discharge planning and [facility] resources.   Carles Collet RN BSN CM [phone]       Action/Plan:  Discharge to SNF as facilitated through [place].   Expected Discharge Date:                  Expected Discharge Plan:  [place]  In-House Referral:     Discharge planning Services  CM Consult  Post Acute Care Choice:    Choice offered to:     DME Arranged:    DME Agency:     HH Arranged:    [place] Agency:     Status of Service:  Completed, signed off  Medicare Important Message Given:    Date Medicare IM Given:    Medicare IM give by:    Date Additional Medicare IM Given:    Additional Medicare Important Message give by:     If discussed at [place] of Stay Meetings, dates discussed:    Additional Comments:  Carles Collet, RN 08/04/2015, 3:44 PM

## 2015-08-04 NOTE — Interval H&P Note (Signed)
History and Physical Interval Note:  08/04/2015 2:01 PM  [name]  has presented today for surgery, with the diagnosis of rectal bleeding  The various methods of treatment have been discussed with the patient and family. After consideration of risks, benefits and other options for treatment, the patient has consented to  Procedure(s): FLEXIBLE SIGMOIDOSCOPY (N/A) as a surgical intervention .  The patient's history has been reviewed, patient examined, no change in status, stable for surgery.  I have reviewed the patient's chart and labs.  Questions were answered to the patient's satisfaction.     Ho[place] C.

## 2015-08-05 ENCOUNTER — Encounter (HOSPITAL_COMMUNITY): Payer: Self-pay | Admitting: Gastroenterology

## 2015-08-06 ENCOUNTER — Non-Acute Institutional Stay (SKILLED_NURSING_FACILITY): Payer: Medicare Other | Admitting: Adult Health

## 2015-08-06 DIAGNOSIS — I82541 Chronic embolism and thrombosis of right tibial vein: Secondary | ICD-10-CM | POA: Diagnosis not present

## 2015-08-06 DIAGNOSIS — N186 End stage renal disease: Secondary | ICD-10-CM | POA: Diagnosis not present

## 2015-08-06 DIAGNOSIS — E43 Unspecified severe protein-calorie malnutrition: Secondary | ICD-10-CM

## 2015-08-06 DIAGNOSIS — K5641 Fecal impaction: Secondary | ICD-10-CM

## 2015-08-06 DIAGNOSIS — J8410 Pulmonary fibrosis, unspecified: Secondary | ICD-10-CM

## 2015-08-06 DIAGNOSIS — M3500 Sicca syndrome, unspecified: Secondary | ICD-10-CM | POA: Diagnosis not present

## 2015-08-06 DIAGNOSIS — Z992 Dependence on renal dialysis: Secondary | ICD-10-CM

## 2015-08-07 ENCOUNTER — Encounter: Payer: Self-pay | Admitting: Adult Health

## 2015-08-07 NOTE — Progress Notes (Signed)
Patient ID: Travis [name], male   DOB: [birth year], 74 y.o.   MRN: [id number]    Facility:  [facility]      No Known Allergies  Chief Complaint  Patient presents with  . Hospitalization Follow-up    HPI:  Travis [name] has been hospitalized for severe constipation with fecal impaction. Travis [name] is here for short term rehab with his goal to return home. Travis [name] has ESRD on hemodialysis; systolic heart failure; pulmonary fibrosis and Sjogren's disease. I am not certain if this is short term or long term placement for him    Past Medical History  Diagnosis Date  . Medical history non-contributory   . Arthritis   . Pneumonia   . GERD (gastroesophageal reflux disease)   . Sjogren's disease ([place]) 01/28/2015  . Pulmonary fibrosis ([place])   . ESRD on hemodialysis [facility]) June 2016    had severe renal failure May-June 2016 with TMA on biopsy, felt to be idiopathic. Received plasma exchange and steroids but didn't respond and ended up starting hemodialysis June 2016.   Marland Kitchen Acute on chronic diastolic CHF (congestive heart failure) ([place]) 04/18/2015  . Accelerated hypertension 04/19/2015    Past Surgical History  Procedure Laterality Date  . Hemorroidectomy  1999  . Surgical procedure to remove a mole as a child Right Eye area    At around 24 years old  . Insertion of dialysis catheter N/A 02/17/2015    Procedure: INSERTION OF DIALYSIS CATHETER RIGHT INTERNAL JUGULAR VEIN;  Surgeon: Elam Dutch, MD;  Location: [facility];  Service: Vascular;  Laterality: N/A;  . Av fistula placement Right 02/17/2015    Procedure: INSERTION OF RIGHT ARM  ARTERIOVENOUS (AV) GORE-TEX GRAFT ;  Surgeon: Elam Dutch, MD;  Location: [facility];  Service: Vascular;  Laterality: Right;  . Flexible sigmoidoscopy N/A 08/04/2015    Procedure: FLEXIBLE SIGMOIDOSCOPY;  Surgeon: Wilford Corner, MD;  Location: [facility] ENDOSCOPY;  Service: Endoscopy;  Laterality: N/A;    VITAL SIGNS BP 140/90 mmHg  Pulse 90  Ht 5\' 10"  (1.778 m)  Wt 138 lb (62.596 kg)   BMI 19.80 kg/m2  SpO2 99%  Patient's Medications  New Prescriptions   No medications on file  Previous Medications   AMINO ACIDS-PROTEIN HYDROLYS (FEEDING SUPPLEMENT, PRO-STAT SUGAR FREE 64,) LIQD    Take 30 mLs by mouth 2 (two) times daily.   ASPIRIN EC 81 MG EC TABLET    Take 1 tablet (81 mg total) by mouth daily.   ATORVASTATIN (LIPITOR) 40 MG TABLET    Take 1 tablet (40 mg total) by mouth daily at 6 PM.   BISACODYL (DULCOLAX) 5 MG EC TABLET    Take 2 tablets (10 mg total) by mouth daily as needed for moderate constipation.   CARVEDILOL (COREG) 3.125 MG TABLET    TAKE 1 TABLET BY MOUTH 2 TIMES DAILY WITH A MEAL   CYANOCOBALAMIN 500 MCG TABLET    Take 1 tablet (500 mcg total) by mouth daily.   GUAIFENESIN-DEXTROMETHORPHAN (ROBITUSSIN DM) 100-10 MG/5ML SYRUP    Take 5 mLs by mouth every 4 (four) hours as needed for cough.   HYDROXYCHLOROQUINE (PLAQUENIL) 200 MG TABLET    Take 200 mg by mouth daily.   MULTIVITAMIN (RENA-VIT) TABS TABLET    Take 1 tablet by mouth daily.   NUTRITIONAL SUPPLEMENTS (FEEDING SUPPLEMENT, NEPRO CARB STEADY,) LIQD    Take 237 mLs by mouth 2 (two) times daily between meals.   senna Take 2 tabs daily    POLYETHYLENE GLYCOL (  MIRALAX / GLYCOLAX) PACKET    Take 17 g by mouth 2 (two) times daily.   SEVELAMER CARBONATE (RENVELA) 800 MG TABLET    Take 2 tablets (1,600 mg total) by mouth 3 (three) times daily with meals.  Modified Medications   No medications on file  Discontinued Medications     SIGNIFICANT DIAGNOSTIC EXAMS  07-28-15: ct of abdomen and pelvis: 1. Circumferential rectal wall thickening with stool ball distending the rectum. Small volume of stool throughout the remainder of the colon. 2. Chronic pleural effusions, small to moderate in degree, left greater than right. This is improved from chest CT of 05/16/2015. 3. Probable gallstones and sludge in the gallbladder. 4. Additional chronic findings as described.  07-31-15: kub: Moderate colonic stool  burden without evidence of enteric obstruction.  08-03-15: chest x-ray: Small bilateral pleural effusions with new atelectasis or consolidation at the left lung base. Probable mild interstitial pulmonary edema.  08-04-15: flex sigmoidoscopy: mild inflammation in rectum and distal sigmoid colon likely due to recent fecal impaction. No active bleeding.    LABS REVIEWED:  07-28-15; wbc 9.8; hgb 11.7; hct 39.7; mcv 91.3; plt 140; glucose 87; bun 21; creat 6.02; k+ 5.3; na++137; liver normal albumin 1.5; lipase 30 07-30-15: wbc 9.2; hgb 11.0; hct 36.2; mcv 91.0; plt 163; glucose 84; bun 14; creat 4.99; k+ 4.6; na++139 08-04-15: wbc 8.0; hgb 11.0; hct 35.9; mcv 90.4; plt 162; glucose 66; bun 42; creat 6.06; k+ 4.6; na++131; phos 4.7; albumin 1.4    Review of Systems  Unable to perform ROS: other      Physical Exam  Constitutional: No distress.  Frail   Eyes: Conjunctivae are normal.  Neck: Neck supple. No JVD present. No thyromegaly present.  Cardiovascular: Normal rate, regular rhythm and intact distal pulses.   Respiratory: Effort normal and breath sounds normal. No respiratory distress. Travis [name] has no wheezes.  GI: Soft. Bowel sounds are normal. Travis [name] exhibits no distension. There is no tenderness.  Musculoskeletal: Travis [name] exhibits no edema.  Able to move all extremities   Lymphadenopathy:    Travis [name] has no cervical adenopathy.  Neurological: Travis [name] is alert.  Skin: Skin is warm and dry. Travis [name] is not diaphoretic.  Right arm A/V shunt + thrill + bruit   Psychiatric: Travis [name] has a normal mood and affect.       ASSESSMENT/ PLAN:  1. ESRD: is followed by nephrology is on hemodialysis three days per week. Will continue renvela 1600 mg three times daily will monitor  2. Severe protein calorie malnutrition: albumin is 1.4. Will continue prostat 30 cc twice daily nephro twice daily and rena-vite daily will monitor  3. Dyslipidemia: will continue lipitor 40 mg daily   4. Sjogren's disease: is without change  in status is on plaquenil 200 mg daily will monitor  5. Hypertension: will continue coreg 3.125 mg twice daily   6. 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 Travis [name] is not a candidate for anticoagulation due to renal hematoma. Is status post IVC filter placement 02/2105.  8. Pulmonary fibrosis: is without change in status; will continue 02 prn     Time spent with patient  50   minutes >50% time spent counseling; reviewing medical record; tests; labs; and developing future plan of care     Ok Edwards NP [facility]  Contact [phone] Monday through  Friday 8am- 5pm  After hours call [phone]

## 2015-08-11 ENCOUNTER — Non-Acute Institutional Stay (SKILLED_NURSING_FACILITY): Payer: Medicare Other | Admitting: Internal Medicine

## 2015-08-11 ENCOUNTER — Encounter: Payer: Self-pay | Admitting: Internal Medicine

## 2015-08-11 DIAGNOSIS — I82541 Chronic embolism and thrombosis of right tibial vein: Secondary | ICD-10-CM

## 2015-08-11 DIAGNOSIS — J8410 Pulmonary fibrosis, unspecified: Secondary | ICD-10-CM

## 2015-08-11 DIAGNOSIS — J189 Pneumonia, unspecified organism: Secondary | ICD-10-CM

## 2015-08-11 DIAGNOSIS — Z992 Dependence on renal dialysis: Secondary | ICD-10-CM

## 2015-08-11 DIAGNOSIS — J9621 Acute and chronic respiratory failure with hypoxia: Secondary | ICD-10-CM | POA: Diagnosis not present

## 2015-08-11 DIAGNOSIS — D696 Thrombocytopenia, unspecified: Secondary | ICD-10-CM | POA: Diagnosis not present

## 2015-08-11 DIAGNOSIS — K5641 Fecal impaction: Secondary | ICD-10-CM | POA: Diagnosis not present

## 2015-08-11 DIAGNOSIS — I5021 Acute systolic (congestive) heart failure: Secondary | ICD-10-CM

## 2015-08-11 DIAGNOSIS — R627 Adult failure to thrive: Secondary | ICD-10-CM | POA: Insufficient documentation

## 2015-08-11 DIAGNOSIS — N186 End stage renal disease: Secondary | ICD-10-CM

## 2015-08-11 DIAGNOSIS — E43 Unspecified severe protein-calorie malnutrition: Secondary | ICD-10-CM

## 2015-08-11 DIAGNOSIS — M3500 Sicca syndrome, unspecified: Secondary | ICD-10-CM

## 2015-08-11 NOTE — Assessment & Plan Note (Signed)
07/28/2015 CT abdomen pelvis--stool ball and rectum with rectal wall thickening. improved versus 05/16/2015 -Multiple rounds of daily enemas and scheduled MiraLAX with some improvement. -seen by GI and underwent flexible sigmoidoscopy today showing Mild inflammation in rectum and distal sigmoid colon likely due to recent fecal impaction. No active bleeding -GI recommend frequent enemas and laxatives as needed. No further recommendations

## 2015-08-11 NOTE — Assessment & Plan Note (Signed)
Per PCCM note from 5/17 patient requires follow-up HRCT. HRCT obtained 05/16/15 and is currently without specific evidence of interstitial lung disease though it's utility was limited somewhat by atelectasis and current bronchiectasis as well as pleural effusions - repeat HRCT is suggested in 12 months if concerns for pulmonary fibrosis persist. -on 2L at home.  -having congestive cough. CXR showing possible left lung base consolidation, ?acute bronchitis vs aspiration PNA. Marland Kitchen added levaquin for a 5 day course.

## 2015-08-11 NOTE — Assessment & Plan Note (Signed)
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

## 2015-08-11 NOTE — Assessment & Plan Note (Signed)
having congestive cough. CXR showing possible left lung base consolidation, ?acute bronchitis vs aspiration PNA. Marland Kitchen added levaquin for a 5 day course. SNF - cont levaquin for 5 more days

## 2015-08-11 NOTE — Assessment & Plan Note (Signed)
avoiding Marinol due to likelihood of causing severe confusion in this patient. Continue supplement

## 2015-08-11 NOTE — Assessment & Plan Note (Signed)
In May 2016 patient was discharged by [facility] outpatient on prednisone 5 mg daily however MAR does not show patient as currently being on steroid -Continue Plaquenil 200 mg daily -no convincing evidence of lung involvement per HRCT chest

## 2015-08-11 NOTE — Assessment & Plan Note (Signed)
secondary to thrombotic microangiopathy  -Nephrology following. On scheduled HD SNF - cont M,W,F dialysis

## 2015-08-11 NOTE — Progress Notes (Signed)
MRN: [id number] Name: Travis [name]  Sex: male Age: 74 y.o. DOB: 1940/10/14  [place] #: Karren Burly Facility/Room:121 Level Of Care: SNF Provider: Inocencio Homes D Emergency Contacts: Extended Emergency Contact Information Primary Emergency Contact: [facility] Address: [address]          [place], [place] [address] Johnnette Litter of [place] Phone: [phone] Mobile Phone: [phone] Relation: Spouse Secondary Emergency Contact: Graylon Good [place] of [place] Phone: [phone] Relation: Daughter  Code Status:   Allergies: Review of patient's allergies indicates no known allergies.  Chief Complaint  Patient presents with  . New Admit To SNF    HPI: Patient is 74 y.o. male with history of ESRD (MWF), systolic and diastolic CHF, recent hospitalization in 05/2015 for acute on chronic respiratory failure secondary to volume overload, hypertension, Sjogren syndrome, chronic respiratory failure on 2 L presented with three-day history of rectal bleeding, constipation, and rectal pain. The patient had given himself enemas 2 at home. He had some watery stool without any solid stool. Soapsuds enema was given after admission without significant improvement. CT of the abdomen and pelvis after admission revealed a stool ball in rectum with rectal wall thickening. Hgb has remained stable.Pt was admitted to [facility] from 1/14-21 w GI was consulted, with neg w/u except stool ball and rec for intense bowel regimen. Course was complicated by PNA treated with abx. Pt is admitted to SNF for generalized weakness. While at SNF pt will be followed for Sjogrens, tx with plaquenil, CHF, tx with coreg and dialysis and HLD, tx with lipitor.   Past Medical History  Diagnosis Date  . Medical history non-contributory   . Arthritis   . Pneumonia   . GERD (gastroesophageal reflux disease)   . Sjogren's disease ([place]) 01/28/2015  . Pulmonary fibrosis ([place])   . ESRD on hemodialysis  [facility]) June 2016    had severe renal failure May-June 2016 with TMA on biopsy, felt to be idiopathic. Received plasma exchange and steroids but didn't respond and ended up starting hemodialysis June 2016.   Marland Kitchen Acute on chronic diastolic CHF (congestive heart failure) ([place]) 04/18/2015  . Accelerated hypertension 04/19/2015    Past Surgical History  Procedure Laterality Date  . Hemorroidectomy  1999  . Surgical procedure to remove a mole as a child Right Eye area    At around 43 years old  . Insertion of dialysis catheter N/A 02/17/2015    Procedure: INSERTION OF DIALYSIS CATHETER RIGHT INTERNAL JUGULAR VEIN;  Surgeon: Elam Dutch, MD;  Location: [facility];  Service: Vascular;  Laterality: N/A;  . Av fistula placement Right 02/17/2015    Procedure: INSERTION OF RIGHT ARM  ARTERIOVENOUS (AV) GORE-TEX GRAFT ;  Surgeon: Elam Dutch, MD;  Location: [facility];  Service: Vascular;  Laterality: Right;  . Flexible sigmoidoscopy N/A 08/04/2015    Procedure: FLEXIBLE SIGMOIDOSCOPY;  Surgeon: Wilford Corner, MD;  Location: [facility] ENDOSCOPY;  Service: Endoscopy;  Laterality: N/A;      Medication List       This list is accurate as of: 08/11/15 11:59 PM.  Always use your most recent med list.               aspirin 81 MG EC tablet  Take 1 tablet (81 mg total) by mouth daily.     atorvastatin 40 MG tablet  Commonly known as:  LIPITOR  Take 1 tablet (40 mg total) by mouth daily at 6 PM.     bisacodyl 5 MG EC tablet  Commonly known as:  DULCOLAX  Take 2 tablets (10 mg total) by mouth daily as needed for moderate constipation.     carvedilol 3.125 MG tablet  Commonly known as:  COREG  TAKE 1 TABLET BY MOUTH 2 TIMES DAILY WITH A MEAL     cyanocobalamin 500 MCG tablet  Take 1 tablet (500 mcg total) by mouth daily.     feeding supplement (NEPRO CARB STEADY) Liqd  Take 237 mLs by mouth 2 (two) times daily between meals.     feeding supplement (PRO-STAT SUGAR FREE 64) Liqd  Take 30 mLs by mouth 2  (two) times daily.     guaiFENesin-dextromethorphan 100-10 MG/5ML syrup  Commonly known as:  ROBITUSSIN DM  Take 5 mLs by mouth every 4 (four) hours as needed for cough.     hydroxychloroquine 200 MG tablet  Commonly known as:  PLAQUENIL  Take 200 mg by mouth daily.     multivitamin Tabs tablet  Take 1 tablet by mouth daily.     polyethylene glycol packet  Commonly known as:  MIRALAX / GLYCOLAX  Take 17 g by mouth 2 (two) times daily.     sevelamer carbonate 800 MG tablet  Commonly known as:  RENVELA  Take 2 tablets (1,600 mg total) by mouth 3 (three) times daily with meals.        No orders of the defined types were placed in this encounter.    There is no immunization history for the selected administration types on file for this patient.  Social History  Substance Use Topics  . Smoking status: Never Smoker   . Smokeless tobacco: Never Used  . Alcohol Use: No    Family history is + HTN   Review of Systems  DATA OBTAINED: from patient, nurse GENERAL:  no fevers, fatigue, appetite changes SKIN: No itching, rash or wounds EYES: No eye pain, redness, discharge EARS: No earache, tinnitus, change in hearing NOSE: No congestion, drainage or bleeding  MOUTH/THROAT: No mouth or tooth pain, No sore throat RESPIRATORY: No cough, wheezing, SOB CARDIAC: No chest pain, palpitations, lower extremity edema  GI: No abdominal pain, No N/V/D or constipation, No heartburn or reflux  GU: No dysuria, frequency or urgency, or incontinence  MUSCULOSKELETAL: No unrelieved bone/joint pain NEUROLOGIC: No headache, dizziness or focal weakness PSYCHIATRIC: No c/o anxiety or sadness   Filed Vitals:   08/13/15 1349  BP: 116/60  Pulse: 90  Temp: 97.1 F (36.2 C)  Resp: 20    SpO2 Readings from Last 1 Encounters:  08/06/15 99%        Physical Exam  GENERAL APPEARANCE: Alert, conversant,   No acute distress.  SKIN: No diaphoresis rash HEAD: Normocephalic, atraumatic  EYES:  Conjunctiva/lids clear. Pupils round, reactive. EOMs intact.  EARS: External exam WNL, canals clear. Hearing grossly normal.  NOSE: No deformity or discharge.  MOUTH/THROAT: Lips w/o lesions  RESPIRATORY: Breathing is even, unlabored. Lung sounds are clear   CARDIOVASCULAR: Heart RRR no murmurs, rubs or gallops. No peripheral edema; R AV fistula  GASTROINTESTINAL: Abdomen is soft, non-tender, not distended w/ normal bowel sounds. GENITOURINARY: Bladder non tender, not distended  MUSCULOSKELETAL: No abnormal joints or musculature NEUROLOGIC:  Cranial nerves 2-12 grossly intact. Moves all extremities  PSYCHIATRIC: dementia with intermiddent confusion as baseline, no behavioral issues  Patient Active Problem List   Diagnosis Date Noted  . Thrombocytopenia ([place]) 08/11/2015  . FTT (failure to thrive) in adult 08/11/2015  . Cough   . Rectal pain  07/28/2015  . Fecal impaction ([place]) 07/28/2015  . Rectal bleeding 07/28/2015  . Chronic respiratory failure ([place]) 07/28/2015  . Lower gastrointestinal bleed   . Pulmonary edema 05/14/2015  . Hypotension of hemodialysis 04/25/2015  . Acute systolic CHF (congestive heart failure) ([place]) 04/25/2015  . Protein-calorie malnutrition, severe ([place]) 04/19/2015  . NSTEMI (non-ST elevated myocardial infarction) ([place]) 04/18/2015  . ESRD on dialysis ([place])   . Acute renal failure syndrome ([place])   . AKI (acute kidney injury) ([place])   . Palliative care encounter   . Acute on chronic renal failure ([place]) 01/31/2015  . Acute DVT of right tibial vein ([place]) 01/29/2015  . Acute kidney injury ([place]) 01/29/2015  . DVT (deep venous thrombosis) ([place]) 01/29/2015  . Acute on chronic respiratory failure with hypoxia ([place]) 01/28/2015  . Physical deconditioning 01/28/2015  . Sjogren's disease ([place]) 01/28/2015  . Pedal edema 01/28/2015  . Peripheral edema 01/07/2015  . HCAP (healthcare-associated pneumonia) 01/06/2015  . Steroid-induced hyperglycemia 01/06/2015  . Dyspnea    . Pulmonary fibrosis ([place])   . Knee pain   . Shoulder pain   . Postinflammatory pulmonary fibrosis ([place]) 12/22/2014  . Anasarca 12/17/2014  . Fever 12/17/2014  . Elevated blood pressure 12/17/2014  . Normocytic anemia 12/17/2014    CBC    Component Value Date/Time   WBC 8.8 08/04/2015 0459   RBC 3.97* 08/04/2015 0459   RBC 4.26 12/18/2014 0038   HGB 11.0* 08/04/2015 0459   HCT 35.9* 08/04/2015 0459   PLT 162 08/04/2015 0459   MCV 90.4 08/04/2015 0459   LYMPHSABS 0.8 07/28/2015 0144   MONOABS 1.4* 07/28/2015 0144   EOSABS 0.2 07/28/2015 0144   BASOSABS 0.1 07/28/2015 0144    CMP     Component Value Date/Time   NA 131* 08/04/2015 0749   K 4.6 08/04/2015 0749   CL 96* 08/04/2015 0749   CO2 28 08/04/2015 0749   GLUCOSE 66 08/04/2015 0749   BUN 42* 08/04/2015 0749   CREATININE 6.06* 08/04/2015 0749   CALCIUM 7.9* 08/04/2015 0749   PROT 5.2* 07/28/2015 0144   ALBUMIN 1.4* 08/04/2015 0749   AST 32 07/28/2015 0144   ALT 17 07/28/2015 0144   ALKPHOS 110 07/28/2015 0144   BILITOT 0.2* 07/28/2015 0144   GFRNONAA 8* 08/04/2015 0749   GFRAA 10* 08/04/2015 0749    Lab Results  Component Value Date   HGBA1C 4.9 01/30/2015     Ct Abdomen Pelvis Wo Contrast  07/28/2015  CLINICAL DATA:  Rectal pain.  Rectal bleeding for 3 days. EXAM: CT ABDOMEN AND PELVIS WITHOUT CONTRAST TECHNIQUE: Multidetector CT imaging of the abdomen and pelvis was performed following the standard protocol without IV contrast. COMPARISON:  CT 02/07/2015 FINDINGS: Lower chest: Small- moderate pleural effusions, left greater than right with adjacent compressive atelectasis, improved from chest CT of 05/16/2015. Small pericardial effusion. Liver: No evidence of focal lesion allowing for lack contrast. Hepatobiliary: Gallbladder minimally distended, stones and sludge in the fundus. No biliary dilatation. Pancreas: No ductal dilatation or surrounding inflammation. Spleen: Normal. Adrenal glands: No nodule.  Mild  thickening on the left. Kidneys: No hydronephrosis or right urolithiasis. Minimal stranding about the left kidney with near complete resolution of previous para renal hemorrhage. Stomach/Bowel: Stomach decompressed. There are no dilated or thickened small bowel loops. Diverticulosis in the distal colon without diverticulitis. There is a stool ball distending the rectum with circumferential rectal wall thickening. Small volume of stool throughout the remainder of the colon without colonic wall thickening. The appendix  is normal. Vascular/Lymphatic: Infrarenal IVC filter in place. No retroperitoneal adenopathy. Abdominal aorta is normal in caliber. Reproductive: Prostate gland appears prominent in size. Bladder: Near completely decompressed. Other: Whole body wall and mild mesenteric edema. Fluid within both spermatic cord. Trace intra-abdominal ascites in the pericolic gutters. No free air. No intra-abdominal fluid collection. Musculoskeletal: There are no acute or suspicious osseous abnormalities. Degenerative change at L4-L5, stable in appearance from prior. Degenerative change involves both sacroiliac joints. IMPRESSION: 1. Circumferential rectal wall thickening with stool ball distending the rectum. Small volume of stool throughout the remainder of the colon. 2. Chronic pleural effusions, small to moderate in degree, left greater than right. This is improved from chest CT of 05/16/2015. 3. Probable gallstones and sludge in the gallbladder. 4. Additional chronic findings as described. Electronically Signed   By: Jeb Levering M.D.   On: 07/28/2015 05:43    Not all labs, radiology exams or other studies done during hospitalization come through on my EPIC note; however they are reviewed by me.    Assessment and Plan  Fecal impaction ([place]) 07/28/2015 CT abdomen pelvis--stool ball and rectum with rectal wall thickening. improved versus 05/16/2015 -Multiple rounds of daily enemas and scheduled MiraLAX  with some improvement. -seen by GI and underwent flexible sigmoidoscopy today showing Mild inflammation in rectum and distal sigmoid colon likely due to recent fecal impaction. No active bleeding -GI recommend frequent enemas and laxatives as needed. No further recommendations  Pulmonary fibrosis Per PCCM note from 5/17 patient requires follow-up HRCT. HRCT obtained 05/16/15 and is currently without specific evidence of interstitial lung disease though it's utility was limited somewhat by atelectasis and current bronchiectasis as well as pleural effusions - repeat HRCT is suggested in 12 months if concerns for pulmonary fibrosis persist. -on 2L at home.  -having congestive cough. CXR showing possible left lung base consolidation, ?acute bronchitis vs aspiration PNA. Marland Kitchen added levaquin for a 5 day course.   Acute on chronic respiratory failure with hypoxia Per PCCM note from 5/17 patient requires follow-up HRCT. HRCT obtained 05/16/15 and is currently without specific evidence of interstitial lung disease though it's utility was limited somewhat by atelectasis and current bronchiectasis as well as pleural effusions - repeat HRCT is suggested in 12 months if concerns for pulmonary fibrosis persist. -on 2L at home.  -having congestive cough. CXR showing possible left lung base consolidation, ?acute bronchitis vs aspiration PNA. Marland Kitchen added levaquin for a 5 day course.   HCAP (healthcare-associated pneumonia) having congestive cough. CXR showing possible left lung base consolidation, ?acute bronchitis vs aspiration PNA. Marland Kitchen added levaquin for a 5 day course. SNF - cont levaquin for 5 more days   ESRD on dialysis [facility]) secondary to thrombotic microangiopathy  -Nephrology following. On scheduled HD SNF - cont M,W,F dialysis  Sjogren's disease ([place]) In May 2016 patient was discharged by [facility] outpatient on prednisone 5 mg daily however MAR does not show patient as currently being on steroid -Continue  Plaquenil 200 mg daily -no convincing evidence of lung involvement per HRCT chest  Acute systolic CHF (congestive heart failure) compensated -TTE 04/20/15-- w/ EF 35-40% - volume management per HD   DVT (deep venous thrombosis) not on anticoagulation due to prior history of renal hematoma - s/p IVC filter 02/2015    Protein-calorie malnutrition, severe (HCC) avoiding Marinol due to likelihood of causing severe confusion in this patient. 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 palliative care team consulted again for [place] discussion. He agrees to be DNR. Wants to continue D until MD feel he can no longer tolerate dialysis. Also wants to continue appropriate care including hospitalization, antibiotics etc. Recommend palliative care follow up at the facility.     Time spent 45 min;> 50% of time with patient was spent reviewing records, labs, tests and studies, counseling and developing plan of care  Hennie Duos, MD

## 2015-08-11 NOTE — Assessment & Plan Note (Signed)
compensated -TTE 04/20/15-- w/ EF 35-40% - volume management per HD

## 2015-08-11 NOTE — Assessment & Plan Note (Signed)
not on anticoagulation due to prior history of renal hematoma - s/p IVC filter 02/2015

## 2015-08-11 NOTE — Assessment & Plan Note (Signed)
Patient quite frail with some episodic confusion. palliative care team consulted again for [place] discussion. He agrees to be DNR. Wants to continue D until MD feel he can no longer tolerate dialysis. Also wants to continue appropriate care including hospitalization, antibiotics etc. Recommend palliative care follow up at the facility.

## 2015-08-13 ENCOUNTER — Encounter: Payer: Self-pay | Admitting: Internal Medicine

## 2015-08-14 ENCOUNTER — Ambulatory Visit: Payer: Medicare Other | Admitting: Cardiology

## 2015-08-19 ENCOUNTER — Emergency Department (HOSPITAL_COMMUNITY): Payer: Medicare Other

## 2015-08-19 ENCOUNTER — Encounter: Payer: Self-pay | Admitting: Adult Health

## 2015-08-19 ENCOUNTER — Emergency Department (HOSPITAL_COMMUNITY)
Admission: EM | Admit: 2015-08-19 | Discharge: 2015-08-19 | Disposition: A | Discharge status: Home / Self Care | Payer: Medicare Other | Attending: Emergency Medicine | Admitting: Emergency Medicine

## 2015-08-19 ENCOUNTER — Encounter (HOSPITAL_COMMUNITY): Payer: Self-pay | Admitting: Emergency Medicine

## 2015-08-19 ENCOUNTER — Non-Acute Institutional Stay (SKILLED_NURSING_FACILITY): Payer: Medicare Other | Admitting: Adult Health

## 2015-08-19 DIAGNOSIS — D631 Anemia in chronic kidney disease: Secondary | ICD-10-CM | POA: Insufficient documentation

## 2015-08-19 DIAGNOSIS — I5021 Acute systolic (congestive) heart failure: Secondary | ICD-10-CM

## 2015-08-19 DIAGNOSIS — Z992 Dependence on renal dialysis: Secondary | ICD-10-CM | POA: Diagnosis not present

## 2015-08-19 DIAGNOSIS — I82541 Chronic embolism and thrombosis of right tibial vein: Secondary | ICD-10-CM | POA: Diagnosis not present

## 2015-08-19 DIAGNOSIS — Z7982 Long term (current) use of aspirin: Secondary | ICD-10-CM | POA: Insufficient documentation

## 2015-08-19 DIAGNOSIS — Z8719 Personal history of other diseases of the digestive system: Secondary | ICD-10-CM | POA: Diagnosis not present

## 2015-08-19 DIAGNOSIS — I5033 Acute on chronic diastolic (congestive) heart failure: Secondary | ICD-10-CM | POA: Insufficient documentation

## 2015-08-19 DIAGNOSIS — I120 Hypertensive chronic kidney disease with stage 5 chronic kidney disease or end stage renal disease: Secondary | ICD-10-CM | POA: Diagnosis not present

## 2015-08-19 DIAGNOSIS — N186 End stage renal disease: Secondary | ICD-10-CM | POA: Diagnosis not present

## 2015-08-19 DIAGNOSIS — J209 Acute bronchitis, unspecified: Secondary | ICD-10-CM | POA: Diagnosis not present

## 2015-08-19 DIAGNOSIS — J189 Pneumonia, unspecified organism: Secondary | ICD-10-CM

## 2015-08-19 DIAGNOSIS — M3500 Sicca syndrome, unspecified: Secondary | ICD-10-CM

## 2015-08-19 DIAGNOSIS — J00 Acute nasopharyngitis [common cold]: Secondary | ICD-10-CM | POA: Diagnosis present

## 2015-08-19 DIAGNOSIS — Z79899 Other long term (current) drug therapy: Secondary | ICD-10-CM | POA: Diagnosis not present

## 2015-08-19 DIAGNOSIS — R195 Other fecal abnormalities: Secondary | ICD-10-CM | POA: Diagnosis not present

## 2015-08-19 DIAGNOSIS — N189 Chronic kidney disease, unspecified: Secondary | ICD-10-CM

## 2015-08-19 DIAGNOSIS — Z8701 Personal history of pneumonia (recurrent): Secondary | ICD-10-CM | POA: Insufficient documentation

## 2015-08-19 LAB — BASIC METABOLIC PANEL
ANION GAP: 6 (ref 5–15)
BUN: 12 mg/dL (ref 6–20)
CHLORIDE: 97 mmol/L — AB (ref 101–111)
CO2: 31 mmol/L (ref 22–32)
Calcium: 8.1 mg/dL — ABNORMAL LOW (ref 8.9–10.3)
Creatinine, Ser: 4.79 mg/dL — ABNORMAL HIGH (ref 0.61–1.24)
GFR, EST AFRICAN AMERICAN: 13 mL/min — AB (ref >60)
GFR, EST NON AFRICAN AMERICAN: 11 mL/min — AB (ref >60)
Glucose, Bld: 92 mg/dL (ref 65–99)
POTASSIUM: 4.7 mmol/L (ref 3.5–5.1)
SODIUM: 134 mmol/L — AB (ref 135–145)

## 2015-08-19 LAB — CBC WITH DIFFERENTIAL/PLATELET
BASOS ABS: 0.1 10*3/uL (ref 0.0–0.1)
BASOS PCT: 1 %
EOS ABS: 0.2 10*3/uL (ref 0.0–0.7)
EOS PCT: 2 %
HCT: 34.9 % — ABNORMAL LOW (ref 39.0–52.0)
HEMOGLOBIN: 10.5 g/dL — AB (ref 13.0–17.0)
LYMPHS ABS: 0.6 10*3/uL — AB (ref 0.7–4.0)
Lymphocytes Relative: 9 %
MCH: 27.8 pg (ref 26.0–34.0)
MCHC: 30.1 g/dL (ref 30.0–36.0)
MCV: 92.3 fL (ref 78.0–100.0)
Monocytes Absolute: 1.4 10*3/uL — ABNORMAL HIGH (ref 0.1–1.0)
Monocytes Relative: 19 %
NEUTROS PCT: 69 %
Neutro Abs: 5.1 10*3/uL (ref 1.7–7.7)
PLATELETS: 149 10*3/uL — AB (ref 150–400)
RBC: 3.78 MIL/uL — AB (ref 4.22–5.81)
RDW: 20.5 % — ABNORMAL HIGH (ref 11.5–15.5)
WBC: 7.3 10*3/uL (ref 4.0–10.5)

## 2015-08-19 LAB — POC OCCULT BLOOD, ED: FECAL OCCULT BLD: POSITIVE — AB

## 2015-08-19 MED ORDER — SACCHAROMYCES BOULARDII 250 MG PO CAPS: 250.0000 mg | ORAL_CAPSULE | Freq: Two times a day (BID) | ORAL | Status: DC

## 2015-08-19 MED ORDER — AMOXICILLIN-POT CLAVULANATE 500-125 MG PO TABS: 1.0000 | ORAL_TABLET | Freq: Two times a day (BID) | ORAL | Status: AC

## 2015-08-19 NOTE — ED Provider Notes (Signed)
CSN: [id number]     Arrival date & time 08/19/15  1708 History   First MD Initiated Contact with Patient 08/19/15 1722     Chief Complaint  Patient presents with  . Pneumonia     (Consider location/radiation/quality/duration/timing/severity/associated sxs/prior Treatment) Patient is a 74 y.o. male presenting with pneumonia. The history is provided by the patient and a relative.  Pneumonia  He was transferred here from a nursing care facility because an x-ray showed pneumonia and family was concerned about his being exposed to the elements traveling to dialysis while he had pneumonia. He has had a cough for several days and yesterday he had some blood-streaked sputum. This was just after dialysis. He complains of feeling cold and has been more short of breath than normal. He is on oxygen at baseline because of pulmonary fibrosis. He has not had any fever or sweats. Denies any chest pain. He is also complaining of feeling generally weak.  Past Medical History  Diagnosis Date  . Medical history non-contributory   . Arthritis   . Pneumonia   . GERD (gastroesophageal reflux disease)   . Sjogren's disease ([place]) 01/28/2015  . Pulmonary fibrosis ([place])   . ESRD on hemodialysis [facility]) June 2016    had severe renal failure May-June 2016 with TMA on biopsy, felt to be idiopathic. Received plasma exchange and steroids but didn't respond and ended up starting hemodialysis June 2016.   Marland Kitchen Acute on chronic diastolic CHF (congestive heart failure) ([place]) 04/18/2015  . Accelerated hypertension 04/19/2015   Past Surgical History  Procedure Laterality Date  . Hemorroidectomy  1999  . Surgical procedure to remove a mole as a child Right Eye area    At around 34 years old  . Insertion of dialysis catheter N/A 02/17/2015    Procedure: INSERTION OF DIALYSIS CATHETER RIGHT INTERNAL JUGULAR VEIN;  Surgeon: Elam Dutch, MD;  Location: [place];  Service: Vascular;  Laterality: N/A;  . Av fistula placement Right  02/17/2015    Procedure: INSERTION OF RIGHT ARM  ARTERIOVENOUS (AV) GORE-TEX GRAFT ;  Surgeon: Elam Dutch, MD;  Location: [place];  Service: Vascular;  Laterality: Right;  . Flexible sigmoidoscopy N/A 08/04/2015    Procedure: FLEXIBLE SIGMOIDOSCOPY;  Surgeon: Wilford Corner, MD;  Location: [facility] ENDOSCOPY;  Service: Endoscopy;  Laterality: N/A;   Family History  Problem Relation Age of Onset  . Hypertension Mother   . Healthy Father   . Hypertension Sister   . Hypertension Brother    Social History  Substance Use Topics  . Smoking status: Never Smoker   . Smokeless tobacco: Never Used  . Alcohol Use: No    Review of Systems  All other systems reviewed and are negative.     Allergies  Review of patient's allergies indicates no known allergies.  Home Medications   Prior to Admission medications   Medication Sig Start Date End Date Taking? Authorizing Provider  Amino Acids-Protein Hydrolys (FEEDING SUPPLEMENT, PRO-STAT SUGAR FREE 64,) LIQD Take 30 mLs by mouth 2 (two) times daily. 08/04/15   Nishant Dhungel, MD  amoxicillin-clavulanate (AUGMENTIN) 500-125 MG tablet Take 1 tablet (500 mg total) by mouth 2 (two) times daily. 08/19/15 08/29/15  Gerlene Fee, NP  aspirin EC 81 MG EC tablet Take 1 tablet (81 mg total) by mouth daily. 04/23/15   Reyne Dumas, MD  atorvastatin (LIPITOR) 40 MG tablet Take 1 tablet (40 mg total) by mouth daily at 6 PM. 04/23/15   Reyne Dumas, MD  bisacodyl (DULCOLAX) 5 MG EC tablet Take 2 tablets (10 mg total) by mouth daily as needed for moderate constipation. 08/04/15   Nishant Dhungel, MD  carvedilol (COREG) 3.125 MG tablet TAKE 1 TABLET BY MOUTH 2 TIMES DAILY WITH A MEAL 06/20/15   Darlin Coco, MD  cyanocobalamin 500 MCG tablet Take 1 tablet (500 mcg total) by mouth daily. 12/26/14   Donne Hazel, MD  guaiFENesin-dextromethorphan (ROBITUSSIN DM) 100-10 MG/5ML syrup Take 5 mLs by mouth every 4 (four) hours as needed for cough. 08/04/15   Nishant  Dhungel, MD  hydroxychloroquine (PLAQUENIL) 200 MG tablet Take 200 mg by mouth daily.    Historical Provider, MD  multivitamin (RENA-VIT) TABS tablet Take 1 tablet by mouth daily.    Historical Provider, MD  Nutritional Supplements (FEEDING SUPPLEMENT, NEPRO CARB STEADY,) LIQD Take 237 mLs by mouth 2 (two) times daily between meals. 08/04/15   Nishant Dhungel, MD  polyethylene glycol (MIRALAX / GLYCOLAX) packet Take 17 g by mouth 2 (two) times daily. 08/04/15   Nishant Dhungel, MD  saccharomyces boulardii (FLORASTOR) 250 MG capsule Take 1 capsule (250 mg total) by mouth 2 (two) times daily. 08/19/15 08/29/15  Gerlene Fee, NP  sevelamer carbonate (RENVELA) 800 MG tablet Take 2 tablets (1,600 mg total) by mouth 3 (three) times daily with meals. 05/17/15   Cherene Altes, MD   BP 158/85 mmHg  Pulse 87  Temp(Src) 98.7 F (37.1 C) (Oral)  Resp 22  SpO2 100% Physical Exam  Nursing note and vitals reviewed.  74 year old male, resting comfortably and in no acute distress. Vital signs are significant for hypertension and tachypnea. Oxygen saturation is 100%, which is normal. Head is normocephalic and atraumatic. PERRLA, EOMI. Oropharynx is clear. Neck is nontender and supple without adenopathy or JVD. Back is nontender and there is no CVA tenderness. Lungs are have bibasilar rales. There are no wheezes or rhonchi. Chest is nontender. Heart has regular rate and rhythm without murmur. Abdomen is soft, flat, nontender without masses or hepatosplenomegaly and peristalsis is normoactive. Extremities have no cyanosis or edema, full range of motion is present. AV fistula is present in the right arm with thrill present. Skin is warm and dry without rash. Neurologic: Mental status is normal, cranial nerves are intact, there are no motor or sensory deficits.  ED Course  Procedures (including critical care time) Labs Review Results for orders placed or performed during the hospital encounter of  08/19/15  CBC with Differential  Result Value Ref Range   WBC 7.3 4.0 - 10.5 K/uL   RBC 3.78 (L) 4.22 - 5.81 MIL/uL   Hemoglobin 10.5 (L) 13.0 - 17.0 g/dL   HCT 34.9 (L) 39.0 - 52.0 %   MCV 92.3 78.0 - 100.0 fL   MCH 27.8 26.0 - 34.0 pg   MCHC 30.1 30.0 - 36.0 g/dL   RDW 20.5 (H) 11.5 - 15.5 %   Platelets 149 (L) 150 - 400 K/uL   Neutrophils Relative % 69 %   Neutro Abs 5.1 1.7 - 7.7 K/uL   Lymphocytes Relative 9 %   Lymphs Abs 0.6 (L) 0.7 - 4.0 K/uL   Monocytes Relative 19 %   Monocytes Absolute 1.4 (H) 0.1 - 1.0 K/uL   Eosinophils Relative 2 %   Eosinophils Absolute 0.2 0.0 - 0.7 K/uL   Basophils Relative 1 %   Basophils Absolute 0.1 0.0 - 0.1 K/uL  Basic metabolic panel  Result Value Ref Range   Sodium 134 (  L) 135 - 145 mmol/L   Potassium 4.7 3.5 - 5.1 mmol/L   Chloride 97 (L) 101 - 111 mmol/L   CO2 31 22 - 32 mmol/L   Glucose, Bld 92 65 - 99 mg/dL   BUN 12 6 - 20 mg/dL   Creatinine, Ser 4.79 (H) 0.61 - 1.24 mg/dL   Calcium 8.1 (L) 8.9 - 10.3 mg/dL   GFR calc non Af Amer 11 (L) >60 mL/min   GFR calc Af Amer 13 (L) >60 mL/min   Anion gap 6 5 - 15  POC occult blood, ED  Result Value Ref Range   Fecal Occult Bld POSITIVE (A) NEGATIVE   Imaging Review Dg Chest 2 View  08/19/2015  CLINICAL DATA:  Acute onset of shortness of breath. Initial encounter. EXAM: CHEST  2 VIEW COMPARISON:  Chest radiograph performed 08/03/2015 FINDINGS: Small bilateral pleural effusions are noted, left greater than right. Increased interstitial markings may reflect mild interstitial edema. No pneumothorax is seen. The heart is borderline enlarged. No acute osseous abnormalities are identified. IMPRESSION: Small bilateral pleural effusions, left greater than right. Increased interstitial markings may reflect mild interstitial edema. Borderline cardiomegaly. Electronically Signed   By: Garald Balding M.D.   On: 08/19/2015 18:48   I have personally reviewed and evaluated these images and lab results as  part of my medical decision-making.   MDM   Final diagnoses:  Acute bronchitis, unspecified organism  End-stage renal disease on hemodialysis (HCC)  Anemia of renal disease  Guaiac positive stools    Cough and dyspnea in patient with known history of pulmonary fibrosis. Report of x-ray having shown pneumonia. I was not able to find the x-ray on review of [facility] PACS. Therefore, x-ray will be repeated as well as screening labs. I did talk with patient and family about the dangers of being in the hospital including medication errors and exposure to nosocomial pathogens. I reviewed his note from the nursing care facility and they were starting him on Augmentin for healthcare associated pneumonia.   X-ray she shows no evidence of pneumonia. He has known history of moderate fibrosis and changes from pulmonary fibrosis were probably misinterpreted on prior x-ray. WBC is normal. Hemoglobin is low but is expected to be low with chronic renal failure and is little changed from baseline. He did have a stool which had some blood in it which did test Hemoccult positive. This will need to be followed as an outpatient. In the meantime, he is to continue on his current antibiotic and current dialysis schedule.  Delora Fuel, MD [id number]

## 2015-08-19 NOTE — ED Notes (Signed)
Per [facility]- pt here from [facility] and rehab on [address]. Cough with congestion, xray showed pneumonia. Pt was scheduled to get antibiotics tonight and family refused and wanted the patient admitted to the hospital for antibiotics instead. Family sent the patient here because the family want him to be admitted.

## 2015-08-19 NOTE — Progress Notes (Addendum)
Patient ID: Travis [name], male   DOB: Oct 20, 1940, 74 y.o.   MRN: [id number]   Facility:  [facility]      No Known Allergies  Chief Complaint  Patient presents with  . Acute Visit    follow up chest x-ray     HPI:  He has a congested cough. The nursing staff during the night called the on call provider who ordered a chest x-ray. The report demonstrates slight right lower lobe and modest left lower lobe infiltrate with small left effusion. There are no reports of fever present. There are no reports of changes in appetite.     Past Medical History  Diagnosis Date  . Medical history non-contributory   . Arthritis   . Pneumonia   . GERD (gastroesophageal reflux disease)   . Sjogren's disease ([place]) 01/28/2015  . Pulmonary fibrosis ([place])   . ESRD on hemodialysis [facility]) June 2016    had severe renal failure May-June 2016 with TMA on biopsy, felt to be idiopathic. Received plasma exchange and steroids but didn't respond and ended up starting hemodialysis June 2016.   Marland Kitchen Acute on chronic diastolic CHF (congestive heart failure) ([place]) 04/18/2015  . Accelerated hypertension 04/19/2015    Past Surgical History  Procedure Laterality Date  . Hemorroidectomy  1999  . Surgical procedure to remove a mole as a child Right Eye area    At around 37 years old  . Insertion of dialysis catheter N/A 02/17/2015    Procedure: INSERTION OF DIALYSIS CATHETER RIGHT INTERNAL JUGULAR VEIN;  Surgeon: Elam Dutch, MD;  Location: [place];  Service: Vascular;  Laterality: N/A;  . Av fistula placement Right 02/17/2015    Procedure: INSERTION OF RIGHT ARM  ARTERIOVENOUS (AV) GORE-TEX GRAFT ;  Surgeon: Elam Dutch, MD;  Location: [place];  Service: Vascular;  Laterality: Right;  . Flexible sigmoidoscopy N/A 08/04/2015    Procedure: FLEXIBLE SIGMOIDOSCOPY;  Surgeon: Wilford Corner, MD;  Location: [facility] ENDOSCOPY;  Service: Endoscopy;  Laterality: N/A;    VITAL SIGNS BP 110/67 mmHg  Pulse 80  Temp(Src) 97.6 F  (36.4 C)  Resp 16  Ht 5\' 10"  (1.778 m)  Wt 138 lb (62.596 kg)  BMI 19.80 kg/m2  SpO2 98%  Patient's Medications  New Prescriptions   No medications on file  Previous Medications   AMINO ACIDS-PROTEIN HYDROLYS (FEEDING SUPPLEMENT, PRO-STAT SUGAR FREE 64,) LIQD    Take 30 mLs by mouth 2 (two) times daily.   ASPIRIN EC 81 MG EC TABLET    Take 1 tablet (81 mg total) by mouth daily.   ATORVASTATIN (LIPITOR) 40 MG TABLET    Take 1 tablet (40 mg total) by mouth daily at 6 PM.   BISACODYL (DULCOLAX) 5 MG EC TABLET    Take 2 tablets (10 mg total) by mouth daily as needed for moderate constipation.   CARVEDILOL (COREG) 3.125 MG TABLET    TAKE 1 TABLET BY MOUTH 2 TIMES DAILY WITH A MEAL   CYANOCOBALAMIN 500 MCG TABLET    Take 1 tablet (500 mcg total) by mouth daily.   GUAIFENESIN-DEXTROMETHORPHAN (ROBITUSSIN DM) 100-10 MG/5ML SYRUP    Take 5 mLs by mouth every 4 (four) hours as needed for cough.   HYDROXYCHLOROQUINE (PLAQUENIL) 200 MG TABLET    Take 200 mg by mouth daily.   MULTIVITAMIN (RENA-VIT) TABS TABLET    Take 1 tablet by mouth daily.   NUTRITIONAL SUPPLEMENTS (FEEDING SUPPLEMENT, NEPRO CARB STEADY,) LIQD    Take 237 mLs by  mouth 2 (two) times daily between meals.   POLYETHYLENE GLYCOL (MIRALAX / GLYCOLAX) PACKET    Take 17 g by mouth 2 (two) times daily.   SEVELAMER CARBONATE (RENVELA) 800 MG TABLET    Take 2 tablets (1,600 mg total) by mouth 3 (three) times daily with meals.  Modified Medications   No medications on file  Discontinued Medications   No medications on file     SIGNIFICANT DIAGNOSTIC EXAMS   07-28-15: ct of abdomen and pelvis: 1. Circumferential rectal wall thickening with stool ball distending the rectum. Small volume of stool throughout the remainder of the colon. 2. Chronic pleural effusions, small to moderate in degree, left greater than right. This is improved from chest CT of 05/16/2015. 3. Probable gallstones and sludge in the gallbladder. 4. Additional  chronic findings as described.  07-31-15: kub: Moderate colonic stool burden without evidence of enteric obstruction.  08-03-15: chest x-ray: Small bilateral pleural effusions with new atelectasis or consolidation at the left lung base. Probable mild interstitial pulmonary edema.  08-04-15: flex sigmoidoscopy: mild inflammation in rectum and distal sigmoid colon likely due to recent fecal impaction. No active bleeding.   08-19-15: chest x-ray; slight right lower and modest left lower lobe infiltrate with a small left effusion    LABS REVIEWED:  07-28-15; wbc 9.8; hgb 11.7; hct 39.7; mcv 91.3; plt 140; glucose 87; bun 21; creat 6.02; k+ 5.3; na++137; liver normal albumin 1.5; lipase 30 07-30-15: wbc 9.2; hgb 11.0; hct 36.2; mcv 91.0; plt 163; glucose 84; bun 14; creat 4.99; k+ 4.6; na++139 08-04-15: wbc 8.0; hgb 11.0; hct 35.9; mcv 90.4; plt 162; glucose 66; bun 42; creat 6.06; k+ 4.6; na++131; phos 4.7; albumin 1.4    Review of Systems  Unable to perform ROS: other      Physical Exam  Constitutional: No distress.  Frail   Eyes: Conjunctivae are normal.  Neck: Neck supple. No JVD present. No thyromegaly present.  Cardiovascular: Normal rate, regular rhythm and intact distal pulses.   Respiratory: Effort normal and breath sounds normal. Rhonchi bilateral lower lobes   GI: Soft. Bowel sounds are normal. He exhibits no distension. There is no tenderness.  Musculoskeletal: He exhibits no edema.  Able to move all extremities   Lymphadenopathy:    He has no cervical adenopathy.  Neurological: He is alert.  Skin: Skin is warm and dry. He is not diaphoretic.  Right arm A/V shunt + thrill + bruit   Psychiatric: He has a normal mood and affect.       ASSESSMENT/ PLAN:  1. ESRD: is followed by nephrology is on hemodialysis three days per week. Will continue renvela 1600 mg three times daily will monitor  2. Severe protein calorie malnutrition: albumin is 1.4. Will continue prostat  30 cc twice daily nephro twice daily and rena-vite daily will monitor  3. Dyslipidemia: will continue lipitor 40 mg daily   4. Sjogren's disease: is without change in status is on plaquenil 200 mg daily will monitor  5. Hypertension: will continue coreg 3.125 mg twice daily   6. 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 He is not a candidate for anticoagulation due to renal hematoma. Is status post IVC filter placement 02/2105.  8. Pulmonary fibrosis: is without change in status; will continue 02 prn   9. Health care associated pneumonia: will  begin augementin 500 mg twice daily for 10 days with florastor twice daily for 10 days as probiotic.    His family is insisting that he go to the ED for further assessment and treatment; so he will be in a facility that provides hemodialysis. They want him admitted until the pneumonia has completely resolved so he won't have to go outside to go to dialysis. The staff has explained that he is stable that there will be exposure to the elements. They continue to insist upon a hospitalization.     Time spent with patient  45  minutes >50% time spent counseling; reviewing medical record; tests; labs; and developing future plan of care      Ok Edwards NP [facility]  Contact [phone] Monday through Friday 8am- 5pm  After hours call [phone]

## 2015-08-19 NOTE — Discharge Instructions (Signed)
Continue taking the current antibiotic. Continue with your current dialysis schedule. Return if symptoms worsen or if rectal bleeding gets worse.   Acute Bronchitis Bronchitis is inflammation of the airways that extend from the windpipe into the lungs (bronchi). The inflammation often causes mucus to develop. This leads to a cough, which is the most common symptom of bronchitis.  In acute bronchitis, the condition usually develops suddenly and goes away over time, usually in a couple weeks. Smoking, allergies, and asthma can make bronchitis worse. Repeated episodes of bronchitis may cause further lung problems.  CAUSES Acute bronchitis is most often caused by the same virus that causes a cold. The virus can spread from person to person (contagious) through coughing, sneezing, and touching contaminated objects. SIGNS AND SYMPTOMS   Cough.   Fever.   Coughing up mucus.   Body aches.   Chest congestion.   Chills.   Shortness of breath.   Sore throat.  DIAGNOSIS  Acute bronchitis is usually diagnosed through a physical exam. Your health care provider will also ask you questions about your medical history. Tests, such as chest X-rays, are sometimes done to rule out other conditions.  TREATMENT  Acute bronchitis usually goes away in a couple weeks. Oftentimes, no medical treatment is necessary. Medicines are sometimes given for relief of fever or cough. Antibiotic medicines are usually not needed but may be prescribed in certain situations. In some cases, an inhaler may be recommended to help reduce shortness of breath and control the cough. A cool mist vaporizer may also be used to help thin bronchial secretions and make it easier to clear the chest.  HOME CARE INSTRUCTIONS  Get plenty of rest.   Drink enough fluids to keep your urine clear or pale yellow (unless you have a medical condition that requires fluid restriction). Increasing fluids may help thin your respiratory  secretions (sputum) and reduce chest congestion, and it will prevent dehydration.   Take medicines only as directed by your health care provider.  If you were prescribed an antibiotic medicine, finish it all even if you start to feel better.  Avoid smoking and secondhand smoke. Exposure to cigarette smoke or irritating chemicals will make bronchitis worse. If you are a smoker, consider using nicotine gum or skin patches to help control withdrawal symptoms. Quitting smoking will help your lungs heal faster.   Reduce the chances of another bout of acute bronchitis by washing your hands frequently, avoiding people with cold symptoms, and trying not to touch your hands to your mouth, nose, or eyes.   Keep all follow-up visits as directed by your health care provider.  SEEK MEDICAL CARE IF: Your symptoms do not improve after 1 week of treatment.  SEEK IMMEDIATE MEDICAL CARE IF:  You develop an increased fever or chills.   You have chest pain.   You have severe shortness of breath.  You have bloody sputum.   You develop dehydration.  You faint or repeatedly feel like you are going to pass out.  You develop repeated vomiting.  You develop a severe headache. MAKE SURE YOU:   Understand these instructions.  Will watch your condition.  Will get help right away if you are not doing well or get worse.   This information is not intended to replace advice given to you by your health care provider. Make sure you discuss any questions you have with your health care provider.   Document Released: 10/07/2004 Document Revised: 09/20/2014 Document Reviewed: 02/20/2013 Elsevier Interactive Patient  Education ©2016 [employer] ° °

## 2015-08-25 ENCOUNTER — Emergency Department (HOSPITAL_COMMUNITY): Payer: Medicare Other

## 2015-08-25 ENCOUNTER — Encounter (HOSPITAL_COMMUNITY): Payer: Self-pay | Admitting: Emergency Medicine

## 2015-08-25 ENCOUNTER — Emergency Department (HOSPITAL_COMMUNITY)
Admission: EM | Admit: 2015-08-25 | Discharge: 2015-08-25 | Disposition: A | Discharge status: Home / Self Care | Payer: Medicare Other | Attending: Emergency Medicine | Admitting: Emergency Medicine

## 2015-08-25 DIAGNOSIS — Z792 Long term (current) use of antibiotics: Secondary | ICD-10-CM | POA: Diagnosis not present

## 2015-08-25 DIAGNOSIS — K219 Gastro-esophageal reflux disease without esophagitis: Secondary | ICD-10-CM | POA: Insufficient documentation

## 2015-08-25 DIAGNOSIS — Z992 Dependence on renal dialysis: Secondary | ICD-10-CM | POA: Diagnosis not present

## 2015-08-25 DIAGNOSIS — Z7982 Long term (current) use of aspirin: Secondary | ICD-10-CM | POA: Insufficient documentation

## 2015-08-25 DIAGNOSIS — Z8701 Personal history of pneumonia (recurrent): Secondary | ICD-10-CM | POA: Insufficient documentation

## 2015-08-25 DIAGNOSIS — Y998 Other external cause status: Secondary | ICD-10-CM | POA: Diagnosis not present

## 2015-08-25 DIAGNOSIS — Z79899 Other long term (current) drug therapy: Secondary | ICD-10-CM | POA: Insufficient documentation

## 2015-08-25 DIAGNOSIS — S7011XA Contusion of right thigh, initial encounter: Secondary | ICD-10-CM | POA: Diagnosis not present

## 2015-08-25 DIAGNOSIS — Y9289 Other specified places as the place of occurrence of the external cause: Secondary | ICD-10-CM | POA: Diagnosis not present

## 2015-08-25 DIAGNOSIS — M1990 Unspecified osteoarthritis, unspecified site: Secondary | ICD-10-CM | POA: Insufficient documentation

## 2015-08-25 DIAGNOSIS — S7001XA Contusion of right hip, initial encounter: Secondary | ICD-10-CM | POA: Insufficient documentation

## 2015-08-25 DIAGNOSIS — Z8709 Personal history of other diseases of the respiratory system: Secondary | ICD-10-CM | POA: Diagnosis not present

## 2015-08-25 DIAGNOSIS — W07XXXA Fall from chair, initial encounter: Secondary | ICD-10-CM | POA: Insufficient documentation

## 2015-08-25 DIAGNOSIS — Y9389 Activity, other specified: Secondary | ICD-10-CM | POA: Insufficient documentation

## 2015-08-25 DIAGNOSIS — I120 Hypertensive chronic kidney disease with stage 5 chronic kidney disease or end stage renal disease: Secondary | ICD-10-CM | POA: Insufficient documentation

## 2015-08-25 DIAGNOSIS — N186 End stage renal disease: Secondary | ICD-10-CM | POA: Insufficient documentation

## 2015-08-25 DIAGNOSIS — R52 Pain, unspecified: Secondary | ICD-10-CM

## 2015-08-25 DIAGNOSIS — I5033 Acute on chronic diastolic (congestive) heart failure: Secondary | ICD-10-CM | POA: Insufficient documentation

## 2015-08-25 DIAGNOSIS — S79911A Unspecified injury of right hip, initial encounter: Secondary | ICD-10-CM | POA: Diagnosis present

## 2015-08-25 NOTE — ED Provider Notes (Signed)
CSN: [id number]     Arrival date & time 08/25/15  1713 History   First MD Initiated Contact with Patient 08/25/15 1714     Chief Complaint  Patient presents with  . Fall     (Consider location/radiation/quality/duration/timing/severity/associated sxs/prior Treatment) HPI Comments: Patient is a 74 year old male with a history of arthritis, pneumonia, Sjogren's disease, pulmonary fibrosis, end-stage renal disease on dialysis and CHF who presents today for evaluation and x-ray. Patient states 3 days ago he purposely slid out of the chair landing on his butt and right hip because nobody would come in to help him and he was tired of sitting in the chair. He denies head injury, LOC or other complaints. However since sliding out of the chair he has had pain with movement and standing. He currently does not weight-bear and walk. Prior to the fall he was not walking and used a wheelchair most of the time. He was currently in rehabilitation to gain his strength.  Today he is here for an x-ray and evaluation. Family does not one him to continue physical therapy until they confirm there is nothing broken. Patient had dialysis today and otherwise has no other complaints.  Patient is a 74 y.o. male presenting with fall. The history is provided by the patient.  Fall This is a new problem. Episode onset: 3 days ago. The problem occurs constantly. The problem has not changed since onset.   Past Medical History  Diagnosis Date  . Medical history non-contributory   . Arthritis   . Pneumonia   . GERD (gastroesophageal reflux disease)   . Sjogren's disease ([place]) 01/28/2015  . Pulmonary fibrosis ([place])   . 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 Received plasma exchange and steroids but didn't respond and ended up starting hemodialysis June 2016.   Marland Kitchen Acute on chronic diastolic CHF (congestive heart failure) ([place]) 04/18/2015  .  Accelerated hypertension 04/19/2015   Past Surgical History  Procedure Laterality Date  . Hemorroidectomy  1999  . Surgical procedure to remove a mole as a child Right Eye area    At around 49 years old  . Insertion of dialysis catheter N/A 02/17/2015    Procedure: INSERTION OF DIALYSIS CATHETER RIGHT INTERNAL JUGULAR VEIN;  Surgeon: Elam Dutch, MD;  Location: [place];  Service: Vascular;  Laterality: N/A;  . Av fistula placement Right 02/17/2015    Procedure: INSERTION OF RIGHT ARM  ARTERIOVENOUS (AV) GORE-TEX GRAFT ;  Surgeon: Elam Dutch, MD;  Location: [place];  Service: Vascular;  Laterality: Right;  . Flexible sigmoidoscopy N/A 08/04/2015    Procedure: FLEXIBLE SIGMOIDOSCOPY;  Surgeon: Wilford Corner, MD;  Location: [facility] ENDOSCOPY;  Service: Endoscopy;  Laterality: N/A;   Family History  Problem Relation Age of Onset  . Hypertension Mother   . Healthy Father   . Hypertension Sister   . Hypertension Brother    Social History  Substance Use Topics  . Smoking status: Never Smoker   . Smokeless tobacco: Never Used  . Alcohol Use: No    Review of Systems  Respiratory: Positive for cough.   All other systems reviewed and are negative.     Allergies  Review of patient's allergies indicates no known allergies.  Home Medications   Prior to Admission medications   Medication Sig Start Date End Date Taking? Authorizing Provider  Amino Acids-Protein Hydrolys (FEEDING SUPPLEMENT, PRO-STAT SUGAR FREE 64,) LIQD  Take 30 mLs by mouth 2 (two) times daily. 08/04/15  Yes Nishant Dhungel, MD  amoxicillin-clavulanate (AUGMENTIN) 500-125 MG tablet Take 1 tablet (500 mg total) by mouth 2 (two) times daily. 08/19/15 08/29/15 Yes Gerlene Fee, NP  aspirin EC 81 MG EC tablet Take 1 tablet (81 mg total) by mouth daily. 04/23/15  Yes Reyne Dumas, MD  atorvastatin (LIPITOR) 40 MG tablet Take 1 tablet (40 mg total) by mouth daily at 6 PM. 04/23/15  Yes Reyne Dumas, MD  cyanocobalamin 500 MCG  tablet Take 1 tablet (500 mcg total) by mouth daily. 12/26/14  Yes Donne Hazel, MD  cyproheptadine (PERIACTIN) 4 MG tablet Take 4 mg by mouth 3 (three) times daily as needed for allergies.   Yes Historical Provider, MD  diphenhydrAMINE (SOMINEX) 25 MG tablet Take 25 mg by mouth every 4 (four) hours as needed for itching or sleep.   Yes Historical Provider, MD  guaiFENesin (MUCINEX) 600 MG 12 hr tablet Take 600 mg by mouth every 12 (twelve) hours.   Yes Historical Provider, MD  hydroxychloroquine (PLAQUENIL) 200 MG tablet Take 200 mg by mouth daily.   Yes Historical Provider, MD  hydrOXYzine (ATARAX/VISTARIL) 10 MG tablet Take 10 mg by mouth 3 (three) times daily as needed for itching.   Yes Historical Provider, MD  lanolin anhydrous OINT Apply 1 application topically daily as needed. As needed is unspecified on Medication sheet   Yes Historical Provider, MD  loratadine (CLARITIN) 10 MG tablet Take 10 mg by mouth daily.   Yes Historical Provider, MD  multivitamin (RENA-VIT) TABS tablet Take 1 tablet by mouth daily.   Yes Historical Provider, MD  Nutritional Supplements (FEEDING SUPPLEMENT, NEPRO CARB STEADY,) LIQD Take 237 mLs by mouth 2 (two) times daily between meals. 08/04/15  Yes Nishant Dhungel, MD  pantoprazole (PROTONIX) 40 MG tablet Take 40 mg by mouth daily.   Yes Historical Provider, MD  polyethylene glycol (MIRALAX / GLYCOLAX) packet Take 17 g by mouth 2 (two) times daily. Patient taking differently: Take 17 g by mouth daily.  08/04/15  Yes Nishant Dhungel, MD  PRESCRIPTION MEDICATION Apply 1 application topically 2 (two) times daily. Triamcinolone acetonide 0.1%. Medication sheet does not indicate if ites cream or oint or a lotion   Yes Historical Provider, MD  traMADol-acetaminophen (ULTRACET) 37.5-325 MG tablet Take 1-2 tablets by mouth every 4 (four) hours as needed for moderate pain.   Yes Historical Provider, MD  bisacodyl (DULCOLAX) 5 MG EC tablet Take 2 tablets (10 mg total) by  mouth daily as needed for moderate constipation. Patient not taking: Reported on 08/25/2015 08/04/15   Nishant Dhungel, MD  carvedilol (COREG) 3.125 MG tablet TAKE 1 TABLET BY MOUTH 2 TIMES DAILY WITH A MEAL Patient not taking: Reported on 08/25/2015 06/20/15   Darlin Coco, MD  guaiFENesin-dextromethorphan (ROBITUSSIN DM) 100-10 MG/5ML syrup Take 5 mLs by mouth every 4 (four) hours as needed for cough. Patient not taking: Reported on 08/25/2015 08/04/15   Nishant Dhungel, MD  saccharomyces boulardii (FLORASTOR) 250 MG capsule Take 1 capsule (250 mg total) by mouth 2 (two) times daily. Patient not taking: Reported on 08/25/2015 08/19/15 08/29/15  Gerlene Fee, NP  sevelamer carbonate (RENVELA) 800 MG tablet Take 2 tablets (1,600 mg total) by mouth 3 (three) times daily with meals. 05/17/15   Cherene Altes, MD   BP 148/94 mmHg  Pulse 98  Temp(Src) 97.6 F (36.4 C) (Oral)  Resp 20  Ht 5\' 6"  (1.676 m)  Wt 135 lb (61.236 kg)  BMI 21.80 kg/m2  SpO2 100% Physical Exam  Constitutional: He is oriented to person, place, and time. He appears well-developed and well-nourished. No distress.  HENT:  Head: Normocephalic and atraumatic.  Mouth/Throat: Oropharynx is clear and moist.  Eyes: Conjunctivae and EOM are normal. Pupils are equal, round, and reactive to light.  Neck: Normal range of motion. Neck supple.  Cardiovascular: Normal rate, regular rhythm and intact distal pulses.   No murmur heard. Pulmonary/Chest: Effort normal. No tachypnea. No respiratory distress. He has decreased breath sounds in the right lower field and the left lower field. He has no wheezes. He has no rales.  Abdominal: Soft. He exhibits no distension. There is no tenderness. There is no rebound and no guarding.  Musculoskeletal: Normal range of motion. He exhibits tenderness. He exhibits no edema.       Right hip: He exhibits tenderness and bony tenderness.       Legs: Fistula in the right upper extremity    Neurological: He is alert and oriented to person, place, and time.  Skin: Skin is warm and dry. No rash noted. No erythema.  Psychiatric: He has a normal mood and affect. His behavior is normal.  Nursing note and vitals reviewed.   ED Course  Procedures (including critical care time) Labs Review Labs Reviewed - No data to display  Imaging Review Dg Hip Unilat With Pelvis 2-3 Views Right  08/25/2015  CLINICAL DATA:  Fall 3 days ago.  Posterior right hip pain. EXAM: DG HIP (WITH OR WITHOUT PELVIS) 2-3V RIGHT COMPARISON:  Pelvic CT 07/28/2015 FINDINGS: Vascular calcifications. Chronic sclerosis and degenerative changes at the pubic symphysis. Chronic sclerosis at the SI joints. Right hip is located without a fracture. Pelvic bony ring is intact. IMPRESSION: No acute abnormality to the pelvis or right hip. Electronically Signed   By: Markus Daft M.D.   On: 08/25/2015 19:36   I have personally reviewed and evaluated these images and lab results as part of my medical decision-making.   EKG Interpretation   Date/Time:  Monday August 25 2015 17:28:02 EST Ventricular Rate:  97 PR Interval:  170 QRS Duration: 92 QT Interval:  359 QTC Calculation: 456 R Axis:   6 Text Interpretation:  Sinus rhythm Nonspecific T abnormalities, anterior  leads T wave inversion RESOLVED SINCE PREVIOUS Confirmed by Maryan Rued  MD,  Loree Fee ([id number]) on 08/25/2015 6:03:14 PM      MDM   Final diagnoses:  Pain  Contusion, hip and thigh, right, initial encounter    Patient is a 74 year old male with multiple medical problems who is [facility] with family for evaluation and x-ray of his hip. Patient states he slid out of the chair 3 days ago but since that time has had some pain in his right hip. He denies head injury, loss of consciousness.  However since leading out of the chair he has had a hard time bearing weight or standing.  He currently has no other complaints. He completed a full course  of dialysis today.  Pain when ranging the right help and palpation of the right hip. No other injuries present  8:41 PM Is negative and patient was sent home with family.  Blanchie Dessert, MD 08/25/15 2041

## 2015-08-25 NOTE — Discharge Instructions (Signed)

## 2015-08-25 NOTE — ED Notes (Signed)
Pt departed in a wheelchair, with family, and in NAD.

## 2015-08-25 NOTE — ED Notes (Signed)
Patient completed dialysis and Nursing home and wife wanted patient evaluated for a fall that occurred 3 days ago per EMS. Patient states slide to ground has no complaints.

## 2015-08-25 NOTE — ED Notes (Signed)
Patient transported to X-ray 

## 2015-08-28 ENCOUNTER — Encounter: Payer: Self-pay | Admitting: Cardiology

## 2015-08-28 ENCOUNTER — Ambulatory Visit (INDEPENDENT_AMBULATORY_CARE_PROVIDER_SITE_OTHER): Payer: Medicare Other | Admitting: Cardiology

## 2015-08-28 VITALS — BP 130/80 | HR 98 | Ht 66.0 in | Wt 139.0 lb

## 2015-08-28 DIAGNOSIS — I5042 Chronic combined systolic (congestive) and diastolic (congestive) heart failure: Secondary | ICD-10-CM

## 2015-08-28 NOTE — Progress Notes (Signed)
08/28/2015 Travis [name]   1941/08/14  [id number]  Primary Physician [name], MD Primary Cardiologist: Dr. Mare Ferrari   Reason for Visit/CC: F/u for chronic combined systolic + diastolic CHF  HPI:  The patient is a 74 year old African-American male with a past medical history of ESRD on hemodialysis (MWF), GERD, pulmonary fibrosis, Sjogren's disease, chronic systolic + diastolic dysfunction, hypertension and recent multiple hospital admissions (HCAP 12/2014, DVT [id number] and A/C Systolic+ diastolic CHF exacerbation 04/2015).   He was admitted  for syncope 04/28/2015. This occurred during HD, in the setting of hypotension with SBP in the 50s. This improved after a bolus of IVFs. We were consulted and decision was made to discontinue his Coreg to prevent recurrent hypotension. No further cardiac w/u was indicated. However, he presented back on 05/14/15 with acute CHF secondary to accelerated HTN. Subsequently, IM decided to restart Coreg at a low dose.  I saw him back for post hospital f/u on 06/10/15. At that time, he noted he was doing well w/o any issues. He denied any further syncope at HD. No significant hypotension. BP was well controlled at 130/68. He denied chest pain. He was instructed to f/u in 3 months.   He presents back to clinic with his wife and grandson. He reports that he has done well since his last OV. He denies any further issues with syncope. No events during HD. He denies CP and dyspnea. Still taking Coreg. Volume has been well controlled through HD. His only complaint is frequent constipation. He also reports recent diagnosis of CAP, treated with antibiotics. He is improving.    Current Outpatient Prescriptions  Medication Sig Dispense Refill  . Amino Acids-Protein Hydrolys (FEEDING SUPPLEMENT, PRO-STAT SUGAR FREE 64,) LIQD Take 30 mLs by mouth 2 (two) times daily. 900 mL 0  . amoxicillin-clavulanate (AUGMENTIN) 500-125 MG tablet Take 1 tablet (500 mg total) by mouth 2  (two) times daily. 20 tablet o  . aspirin EC 81 MG EC tablet Take 1 tablet (81 mg total) by mouth daily. 30 tablet 0  . atorvastatin (LIPITOR) 40 MG tablet Take 1 tablet (40 mg total) by mouth daily at 6 PM. 30 tablet 0  . carvedilol (COREG) 3.125 MG tablet Take 1 tablet by mouth 2 (two) times daily.  5  . [facility] TUSSIN DM 100-10 MG/5ML liquid Take 5 mLs by mouth every other day as needed for cough.   0  . cyanocobalamin 500 MCG tablet Take 1 tablet (500 mcg total) by mouth daily. 30 tablet 0  . cyproheptadine (PERIACTIN) 4 MG tablet Take 4 mg by mouth 3 (three) times daily as needed for allergies.    . diphenhydrAMINE (SOMINEX) 25 MG tablet Take 25 mg by mouth every 4 (four) hours as needed for itching or sleep.    Marland Kitchen guaiFENesin (MUCINEX) 600 MG 12 hr tablet Take 600 mg by mouth every 12 (twelve) hours.    . hydroxychloroquine (PLAQUENIL) 200 MG tablet Take 200 mg by mouth daily.    . hydrOXYzine (ATARAX/VISTARIL) 10 MG tablet Take 10 mg by mouth 3 (three) times daily as needed for itching.    . lanolin anhydrous OINT Apply 1 application topically daily as needed. As needed is unspecified on Medication sheet    . loratadine (CLARITIN) 10 MG tablet Take 10 mg by mouth daily.    . multivitamin (RENA-VIT) TABS tablet Take 1 tablet by mouth daily.    . Nutritional Supplements (FEEDING SUPPLEMENT, NEPRO CARB STEADY,) LIQD Take 237 mLs by mouth 2 (two)  times daily between meals. 60 Can 0  . omeprazole (PRILOSEC) 20 MG capsule Take 1 capsule by mouth daily.  11  . pantoprazole (PROTONIX) 40 MG tablet Take 40 mg by mouth daily.    Marland Kitchen PRESCRIPTION MEDICATION Apply 1 application topically 2 (two) times daily. Triamcinolone acetonide 0.1%. Medication sheet does not indicate if ites cream or oint or a lotion    . sevelamer carbonate (RENVELA) 800 MG tablet Take 2 tablets (1,600 mg total) by mouth 3 (three) times daily with meals. 90 tablet 0  . traMADol-acetaminophen (ULTRACET) 37.5-325 MG tablet Take 1-2  tablets by mouth every 4 (four) hours as needed for moderate pain.     No current facility-administered medications for this visit.    No Known Allergies  Social History   Social History  . Marital Status: Married    Spouse Name: N/A  . Number of Children: N/A  . Years of Education: N/A   Occupational History  . Not on file.   Social History Main Topics  . Smoking status: Never Smoker   . Smokeless tobacco: Never Used  . Alcohol Use: No  . Drug Use: No  . Sexual Activity: Not on file   Other Topics Concern  . Not on file   Social History Narrative     Review of Systems: General: negative for chills, fever, night sweats or weight changes.  Cardiovascular: negative for chest pain, dyspnea on exertion, edema, orthopnea, palpitations, paroxysmal nocturnal dyspnea or shortness of breath Dermatological: negative for rash Respiratory: negative for cough or wheezing Urologic: negative for hematuria Abdominal: negative for nausea, vomiting, diarrhea, bright red blood per rectum, melena, or hematemesis Neurologic: negative for visual changes, syncope, or dizziness All other systems reviewed and are otherwise negative except as noted above.    Height 5\' 6"  (1.676 m).  General appearance: alert, cooperative and no distress Neck: no carotid bruit and no JVD Lungs: clear to auscultation bilaterally Heart: regular rate and rhythm and 2/6 SM along the LSB Extremities: no LEE Pulses: 2+ and symmetric Skin: warm and dry Neurologic: Grossly normal  EKG not performed  ASSESSMENT AND PLAN:    1. Syncope: occurred in the setting of hypotension during HD. No further recurrence.  2. Hypotension: occurred during HD. Coreg was discontinued on 04/28/15 by Dr. Marlou Porch, but restarted by internal medicine on 05/14/15 due to severe HTN. He has continued to do well with low dose coreg. He denies any further syncope at HD.   3. ESRD: on HD MWF  4. DVT: s/p IVF 02/2015.   5. Chronic  Combined Systolic + Diastolic Dysfunction: stable. Volume controlled through HD. On low dose Coreg.     PLAN  F/u with MD in 6 months  SIMMONS, BRITTAINY PA-C 08/28/2015 8:41 AM

## 2015-08-28 NOTE — Patient Instructions (Addendum)
Medication Instructions:  Your physician recommends that you continue on your current medications as directed. Please refer to the Current Medication list given to you today.   Labwork: None ordered  Testing/Procedures: None ordered  Follow-Up: Your physician wants you to follow-up in: 6 MONTHS WITH DR. MCALHANY   You will receive a reminder letter in the mail two months in advance. If you don't receive a letter, please call our office to schedule the follow-up appointment.   Any Other Special Instructions Will Be Listed Below (If Applicable).     If you need a refill on your cardiac medications before your next appointment, please call your pharmacy.   

## 2015-09-05 ENCOUNTER — Emergency Department (HOSPITAL_COMMUNITY)
Admission: EM | Admit: 2015-09-05 | Discharge: 2015-09-05 | Disposition: A | Discharge status: Home / Self Care | Payer: Medicare Other | Attending: Emergency Medicine | Admitting: Emergency Medicine

## 2015-09-05 ENCOUNTER — Encounter (HOSPITAL_COMMUNITY): Payer: Self-pay | Admitting: Emergency Medicine

## 2015-09-05 DIAGNOSIS — E875 Hyperkalemia: Secondary | ICD-10-CM | POA: Diagnosis not present

## 2015-09-05 DIAGNOSIS — Z79899 Other long term (current) drug therapy: Secondary | ICD-10-CM | POA: Diagnosis not present

## 2015-09-05 DIAGNOSIS — Z8719 Personal history of other diseases of the digestive system: Secondary | ICD-10-CM | POA: Diagnosis not present

## 2015-09-05 DIAGNOSIS — I120 Hypertensive chronic kidney disease with stage 5 chronic kidney disease or end stage renal disease: Secondary | ICD-10-CM | POA: Insufficient documentation

## 2015-09-05 DIAGNOSIS — M1990 Unspecified osteoarthritis, unspecified site: Secondary | ICD-10-CM | POA: Diagnosis not present

## 2015-09-05 DIAGNOSIS — R197 Diarrhea, unspecified: Secondary | ICD-10-CM | POA: Diagnosis not present

## 2015-09-05 DIAGNOSIS — Z8701 Personal history of pneumonia (recurrent): Secondary | ICD-10-CM | POA: Diagnosis not present

## 2015-09-05 DIAGNOSIS — K921 Melena: Secondary | ICD-10-CM | POA: Diagnosis not present

## 2015-09-05 DIAGNOSIS — D696 Thrombocytopenia, unspecified: Secondary | ICD-10-CM | POA: Diagnosis not present

## 2015-09-05 DIAGNOSIS — N186 End stage renal disease: Secondary | ICD-10-CM | POA: Diagnosis not present

## 2015-09-05 DIAGNOSIS — Z7982 Long term (current) use of aspirin: Secondary | ICD-10-CM | POA: Diagnosis not present

## 2015-09-05 DIAGNOSIS — I5033 Acute on chronic diastolic (congestive) heart failure: Secondary | ICD-10-CM | POA: Insufficient documentation

## 2015-09-05 DIAGNOSIS — Z992 Dependence on renal dialysis: Secondary | ICD-10-CM | POA: Insufficient documentation

## 2015-09-05 DIAGNOSIS — Z8709 Personal history of other diseases of the respiratory system: Secondary | ICD-10-CM | POA: Insufficient documentation

## 2015-09-05 DIAGNOSIS — K625 Hemorrhage of anus and rectum: Secondary | ICD-10-CM | POA: Diagnosis present

## 2015-09-05 LAB — COMPREHENSIVE METABOLIC PANEL
ALBUMIN: 1.7 g/dL — AB (ref 3.5–5.0)
ALT: 13 U/L — AB (ref 17–63)
AST: 26 U/L (ref 15–41)
Alkaline Phosphatase: 70 U/L (ref 38–126)
Anion gap: 7 (ref 5–15)
BUN: 9 mg/dL (ref 6–20)
CHLORIDE: 102 mmol/L (ref 101–111)
CO2: 28 mmol/L (ref 22–32)
CREATININE: 4.13 mg/dL — AB (ref 0.61–1.24)
Calcium: 8.2 mg/dL — ABNORMAL LOW (ref 8.9–10.3)
GFR calc Af Amer: 15 mL/min — ABNORMAL LOW (ref >60)
GFR calc non Af Amer: 13 mL/min — ABNORMAL LOW (ref >60)
GLUCOSE: 103 mg/dL — AB (ref 65–99)
POTASSIUM: 5.4 mmol/L — AB (ref 3.5–5.1)
SODIUM: 137 mmol/L (ref 135–145)
Total Bilirubin: 0.3 mg/dL (ref 0.3–1.2)
Total Protein: 5.9 g/dL — ABNORMAL LOW (ref 6.5–8.1)

## 2015-09-05 LAB — TYPE AND SCREEN
ABO/RH(D): A POS
Antibody Screen: NEGATIVE

## 2015-09-05 LAB — CBC
HEMATOCRIT: 41.8 % (ref 39.0–52.0)
Hemoglobin: 12.8 g/dL — ABNORMAL LOW (ref 13.0–17.0)
MCH: 29.1 pg (ref 26.0–34.0)
MCHC: 30.6 g/dL (ref 30.0–36.0)
MCV: 95 fL (ref 78.0–100.0)
PLATELETS: 94 10*3/uL — AB (ref 150–400)
RBC: 4.4 MIL/uL (ref 4.22–5.81)
RDW: 20.6 % — AB (ref 11.5–15.5)
WBC: 8.3 10*3/uL (ref 4.0–10.5)

## 2015-09-05 LAB — POC OCCULT BLOOD, ED: Fecal Occult Bld: POSITIVE — AB

## 2015-09-05 MED ORDER — SODIUM POLYSTYRENE SULFONATE 15 GM/60ML PO SUSP
15.0000 g | Freq: Once | ORAL | Status: AC
  Administered 2015-09-05: 15 g via ORAL
  Filled 2015-09-05: qty 60

## 2015-09-05 NOTE — ED Notes (Signed)
Pt states 3 days ago he started having bright red bleeding with stools. Pt also reports pain in rectum. Pt states 3 episodes today. Pt denies any abd pain n/v.

## 2015-09-05 NOTE — ED Notes (Signed)
Pt verbalized understanding of d/c instructions and has no further questions. Pt is to follow low potassium diet and go to dialysis Monday as scheduled.

## 2015-09-05 NOTE — ED Notes (Signed)
Pt's brief was soiled. Small amount of blood noted on clothe when cleaning pt. Pt changed, cleaned and covered back up. Hemoccult collected by this RN

## 2015-09-05 NOTE — Discharge Instructions (Signed)
Your potassium was elevated today and your platelet count was low.  Please go to dialysis as scheduled and follow your low potassium diet.  Your platelet count was low and this will need to be rechecked by your family doctor in the next few days.  Follow up with the gastroenterologist for your rectal bleeding.  Get rechecked immediately if you develop worsening bleeding, shortness of breath, or new concerning symptoms.  Do not take your aspirin for the next few days.    Gastrointestinal Bleeding Gastrointestinal (GI) bleeding means there is bleeding somewhere along the digestive tract, between the mouth and anus. CAUSES  There are many different problems that can cause GI bleeding. Possible causes include:  Esophagitis. This is inflammation, irritation, or swelling of the esophagus.  Hemorrhoids.These are veins that are full of blood (engorged) in the rectum. They cause pain, inflammation, and may bleed.  Anal fissures.These are areas of painful tearing which may bleed. They are often caused by passing hard stool.  Diverticulosis.These are pouches that form on the colon over time, with age, and may bleed significantly.  Diverticulitis.This is inflammation in areas with diverticulosis. It can cause pain, fever, and bloody stools, although bleeding is rare.  Polyps and cancer. Colon cancer often starts out as precancerous polyps.  Gastritis and ulcers.Bleeding from the upper gastrointestinal tract (near the stomach) may travel through the intestines and produce black, sometimes tarry, often bad smelling stools. In certain cases, if the bleeding is fast enough, the stools may not be black, but red. This condition may be life-threatening. SYMPTOMS   Vomiting bright red blood or material that looks like coffee grounds.  Bloody, black, or tarry stools. DIAGNOSIS  Your caregiver may diagnose your condition by taking your history and performing a physical exam. More tests may be needed,  including:  X-rays and other imaging tests.  Esophagogastroduodenoscopy (EGD). This test uses a flexible, lighted tube to look at your esophagus, stomach, and small intestine.  Colonoscopy. This test uses a flexible, lighted tube to look at your colon. TREATMENT  Treatment depends on the cause of your bleeding.   For bleeding from the esophagus, stomach, small intestine, or colon, the caregiver doing your EGD or colonoscopy may be able to stop the bleeding as part of the procedure.  Inflammation or infection of the colon can be treated with medicines.  Many rectal problems can be treated with creams, suppositories, or warm baths.  Surgery is sometimes needed.  Blood transfusions are sometimes needed if you have lost a lot of blood. If bleeding is slow, you may be allowed to go home. If there is a lot of bleeding, you will need to stay in the hospital for observation. HOME CARE INSTRUCTIONS   Take any medicines exactly as prescribed.  Keep your stools soft by eating foods that are high in fiber. These foods include whole grains, legumes, fruits, and vegetables. Prunes (1 to 3 a day) work well for many people.  Drink enough fluids to keep your urine clear or pale yellow. SEEK IMMEDIATE MEDICAL CARE IF:   Your bleeding increases.  You feel lightheaded, weak, or you faint.  You have severe cramps in your back or abdomen.  You pass large blood clots in your stool.  Your problems are getting worse. MAKE SURE YOU:   Understand these instructions.  Will watch your condition.  Will get help right away if you are not doing well or get worse.   This information is not intended to replace advice  given to you by your health care provider. Make sure you discuss any questions you have with your health care provider.   Document Released: 08/27/2000 Document Revised: 08/16/2012 Document Reviewed: 02/17/2015 Elsevier Interactive Patient Education 2016 Elsevier  Inc[employer]   Hyperkalemia Hyperkalemia is when you have too much potassium in your blood. Potassium is normally removed (excreted) from your body by your kidneys. If there is too much potassium in your blood, it can affect your heart's ability to function.  CAUSES  Hyperkalemia may be caused by:   Taking in too much potassium. You can do this by:  Using salt substitutes. They contain large amounts of potassium.  Taking potassium supplements.  Eating foods high in potassium.  Excreting too little potassium. This can happen if:  Your kidneys are not working properly. Kidney (renal) disease, including short- or long-term renal failure, is a very common cause of hyperkalemia.  You are taking medicines that lower your excretion of potassium.  You have Addison disease.  You have a urinary tract blockage, such as kidney stones.  You are on treatment to mechanically clean your blood (dialysis) and you skip a treatment.  Releasing a high amount of potassium from your cells into your blood. This can happen with:  Injury to muscles (rhabdomyolysis) or other tissues. Most potassium is stored in your muscles.  Severe burns or infections.  Acidic blood plasma (acidosis). Acidosis can result from many diseases, such as uncontrolled diabetes. RISK FACTORS The most common risk factor of hyperkalemia is kidney disease. Other risk factors of hyperkalemia include:  Addison disease. This is a condition where your glands do not produce enough hormones.  Alcoholism or heavy drug use.   Using certain blood pressure medicines, such as angiotensin-converting enzyme (ACE) inhibitors, angiotensin II receptor blockers (ARBs), or potassium-sparing diuretics such as spironolactone.  Severe injury or burn. SIGNS AND SYMPTOMS  Oftentimes, there are no signs or symptoms of hyperkalemia. However, when your potassium level becomes high enough, you may experience symptoms such as:  Irregular or very slow  heartbeat.  Nausea.  Fatigue.  Tingling of the skin or numbness of the hands or feet.  Muscle weakness.  Fatigue.  Not being able to move (paralysis). You may not have any symptoms of hyperkalemia.  DIAGNOSIS  Hyperkalemia may be diagnosed by:  Physical exam.  Blood tests.  ECG (electrocardiogram).  Discussion of prescription and non-prescription drug use. TREATMENT  Treatment for hyperkalemia is often directed at the underlying cause. In some instances, treatment may include:   Insulin.  Glucose (sugar) and water solution given through a vein (intravenous or IV).  Dialysis.  Medicines to remove the potassium from your body.  Medicines to move calcium from your bloodstream into your tissues. HOME CARE INSTRUCTIONS   Take medicines only as directed by your health care provider.  Do not take any supplements, natural products, herbs, or vitamins without reviewing them with your health care provider. Certain supplements and natural food products can have high amounts of potassium.  Limit your alcohol intake as directed by your health care provider.  Stop illegal drug use. If you need help quitting, ask your health care provider.  Keep all follow-up visits as directed by your health care provider. This is important.  If you have kidney disease, you may need to follow a low potassium diet. A dietitian can help educate you on low potassium foods. SEEK MEDICAL CARE IF:   You notice an irregular or very slow heartbeat.  You feel light-headed.  You feel weak.  You are nauseous.  You have tingling or numbness in your hands or feet. SEEK IMMEDIATE MEDICAL CARE IF:   You have shortness of breath.  You have chest pain or discomfort.  You pass out.  You have muscle paralysis. MAKE SURE YOU:   Understand these instructions.  Will watch your condition.  Will get help right away if you are not doing well or get worse.   This information is not intended to  replace advice given to you by your health care provider. Make sure you discuss any questions you have with your health care provider.   Document Released: 08/20/2002 Document Revised: 09/20/2014 Document Reviewed: 12/05/2013 Elsevier Interactive Patient Education [employer].

## 2015-09-05 NOTE — ED Provider Notes (Addendum)
CSN: [id number]     Arrival date & time 09/05/15  1752 History   First MD Initiated Contact with Patient 09/05/15 1806     Chief Complaint  Patient presents with  . Rectal Bleeding     Patient is a 74 y.o. male presenting with hematochezia. The history is provided by the patient. No language interpreter was used.  Rectal Bleeding  Travis [name] is a 74 y.o. male who presents to the Emergency Department complaining of hematochezia.  Pt is provided by the patient and his wife.  Wife reports two weeks of BRBPR with firm to watery stools, multiple stools daily.  No reports of fevers, abdominal pain, vomiting, shortness of breath.  Pt reports rectal pain with sitting and with bowel movements.  He is incontinent at baseline.  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  He does note have a hx/o GI bleed, he takes a baby ASA daily. .   Past Medical History  Diagnosis Date  . Medical history non-contributory   . Arthritis   . Pneumonia   . GERD (gastroesophageal reflux disease)   . Sjogren's disease ([place]) 01/28/2015  . Pulmonary fibrosis ([place])   . 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 Received plasma exchange and steroids but didn't respond and ended up starting hemodialysis June 2016.   Marland Kitchen Acute on chronic diastolic CHF (congestive heart failure) ([place]) 04/18/2015  . Accelerated hypertension 04/19/2015   Past Surgical History  Procedure Laterality Date  . Hemorroidectomy  1999  . Surgical procedure to remove a mole as a child Right Eye area    At around 47 years old  . Insertion of dialysis catheter N/A 02/17/2015    Procedure: INSERTION OF DIALYSIS CATHETER RIGHT INTERNAL JUGULAR VEIN;  Surgeon: Elam Dutch, MD;  Location: [facility];  Service: Vascular;  Laterality: N/A;  . Av fistula placement Right 02/17/2015    Procedure: INSERTION OF RIGHT ARM   ARTERIOVENOUS (AV) GORE-TEX GRAFT ;  Surgeon: Elam Dutch, MD;  Location: [facility];  Service: Vascular;  Laterality: Right;  . Flexible sigmoidoscopy N/A 08/04/2015    Procedure: FLEXIBLE SIGMOIDOSCOPY;  Surgeon: Wilford Corner, MD;  Location: [facility] ENDOSCOPY;  Service: Endoscopy;  Laterality: N/A;   Family History  Problem Relation Age of Onset  . Hypertension Mother   . Healthy Father   . Hypertension Sister   . Hypertension Brother    Social History  Substance Use Topics  . Smoking status: Never Smoker   . Smokeless tobacco: Never Used  . Alcohol Use: No    Review of Systems  Gastrointestinal: Positive for hematochezia.  All other systems reviewed and are negative.     Allergies  Review of patient's allergies indicates no known allergies.  Home Medications   Prior to Admission medications   Medication Sig Start Date End Date Taking? Authorizing Provider  aspirin EC 81 MG EC tablet Take 1 tablet (81 mg total) by mouth daily. 04/23/15  Yes Reyne Dumas, MD  carvedilol (COREG) 3.125 MG tablet Take 1 tablet by mouth 2 (two) times daily. 07/24/15  Yes Historical Provider, MD  [facility] TUSSIN DM 100-10 MG/5ML liquid Take 5 mLs by mouth every other day as needed for cough.  08/04/15  Yes Historical Provider, MD  cyanocobalamin 500 MCG tablet Take 1 tablet (500 mcg total)  by mouth daily. 12/26/14  Yes Donne Hazel, MD  hydrOXYzine (VISTARIL) 25 MG capsule Take 25 mg by mouth at bedtime as needed (sleep. May repeat 2 hours later if necessary).   Yes Historical Provider, MD  multivitamin (RENA-VIT) TABS tablet Take 1 tablet by mouth daily.   Yes Historical Provider, MD  sevelamer carbonate (RENVELA) 800 MG tablet Take 2 tablets (1,600 mg total) by mouth 3 (three) times daily with meals. 05/17/15  Yes Cherene Altes, MD  atorvastatin (LIPITOR) 40 MG tablet Take 1 tablet (40 mg total) by mouth daily at 6 PM. Patient not taking: Reported on 09/05/2015 04/23/15   Reyne Dumas, MD   BP  159/96 mmHg  Pulse 90  Temp(Src) 97.8 F (36.6 C) (Oral)  Resp 17  Ht 5\' 10"  (1.778 m)  Wt 138 lb (62.596 kg)  BMI 19.80 kg/m2  SpO2 100% Physical Exam  Constitutional: He is oriented to person, place, and time. He appears well-developed and well-nourished.  Chronically ill appearing.   HENT:  Head: Normocephalic and atraumatic.  Cardiovascular: Normal rate and regular rhythm.   No murmur heard. Pulmonary/Chest: Effort normal and breath sounds normal. No respiratory distress.  Abdominal: Soft. There is no tenderness. There is no rebound and no guarding.  Genitourinary:  Rectal tenderness without hemorrhoids or swelling.  Small amt of pink blood present with soft brown stool.   Musculoskeletal: He exhibits no edema or tenderness.  Fistula in RUE  Neurological: He is alert and oriented to person, place, and time.  Skin: Skin is warm and dry.  Psychiatric: He has a normal mood and affect. His behavior is normal.  Nursing note and vitals reviewed.   ED Course  Procedures (including critical care time) Labs Review Labs Reviewed  COMPREHENSIVE METABOLIC PANEL - Abnormal; Notable for the following:    Potassium 5.4 (*)    Glucose, Bld 103 (*)    Creatinine, Ser 4.13 (*)    Calcium 8.2 (*)    Total Protein 5.9 (*)    Albumin 1.7 (*)    ALT 13 (*)    GFR calc non Af Amer 13 (*)    GFR calc Af Amer 15 (*)    All other components within normal limits  CBC - Abnormal; Notable for the following:    Hemoglobin 12.8 (*)    RDW 20.6 (*)    Platelets 94 (*)    All other components within normal limits  POC OCCULT BLOOD, ED - Abnormal; Notable for the following:    Fecal Occult Bld POSITIVE (*)    All other components within normal limits  TYPE AND SCREEN    Imaging Review No results found. I have personally reviewed and evaluated these images and lab results as part of my medical decision-making.   EKG Interpretation   Date/Time:  Friday September 05 2015 17:57:53  EST Ventricular Rate:  98 PR Interval:  174 QRS Duration: 78 QT Interval:  360 QTC Calculation: 459 R Axis:   -42 Text Interpretation:  Normal sinus rhythm Left axis deviation Possible  Anterior infarct , age undetermined Abnormal ECG Confirmed by Hazle Coca  [phone]) on 09/05/2015 6:46:22 PM      MDM   Final diagnoses:  Hematochezia  Thrombocytopenia (HCC)  Hyperkalemia    Pt here for evaluation of hematochezia for two weeks.  Pt with pink blood in diaper, has fecal incontinence at baseline.  No evidence of rectal wounds or hemorrhoids.  Exam not c/w proctitis.  Hgb is  improved from priors.  CBC demonstrates thrombocytopenia - this is present off and on for the patient, no evidence of major hemorrhage on exam.  BMP with hyperkalemia, EKG without any acute changes, pt is euvolemic on exam.  D/w nephrology findings of hyperkalemia - recommends kayexalate with low potassium diet and outpatient dialysis as scheduled.  D/w pt and wife need for PCP, GI follow up re rectal pain/hematochezia as well as thrombocytopenia.  Also discussed dialysis follow up/hyperkalemia.  Close return precautions discussed.     Quintella Reichert, MD 09/06/15 0115  Vital signs reviewed - HR of 157 was documented in error, pt without tachycardia in the ED during his stay.    Quintella Reichert, MD 09/06/15 [phone]

## 2015-09-16 ENCOUNTER — Encounter: Payer: Self-pay | Admitting: Podiatry

## 2015-09-16 ENCOUNTER — Ambulatory Visit (INDEPENDENT_AMBULATORY_CARE_PROVIDER_SITE_OTHER): Payer: Medicare Other | Admitting: Podiatry

## 2015-09-16 VITALS — BP 139/114 | HR 87 | Resp 12

## 2015-09-16 DIAGNOSIS — M79674 Pain in right toe(s): Secondary | ICD-10-CM | POA: Diagnosis not present

## 2015-09-16 DIAGNOSIS — M79675 Pain in left toe(s): Secondary | ICD-10-CM

## 2015-09-16 DIAGNOSIS — B351 Tinea unguium: Secondary | ICD-10-CM | POA: Diagnosis not present

## 2015-09-16 NOTE — Patient Instructions (Signed)
Today I trim the thick and deformed fungal toenails right and left feet. The deformities toenails will recur. Reappoint at your request at approximately 4-6 month intervals

## 2015-09-16 NOTE — Progress Notes (Signed)
   Subjective:    Patient ID: Travis [name], male    DOB: 1941-02-09, 74 y.o.   MRN: [id number]  HPI   This patient presents today stating that his toenails are thick and long and are comfortable walking wearing shoes over a long period of time with gradual increasing symptoms in the toenail area. He denies any professional care and family members are patient had attempted to trim the toenails, however, the toenails are still deformed and uncomfortable.  Review of Systems  Respiratory: Positive for shortness of breath.   Gastrointestinal: Positive for blood in stool.  Musculoskeletal: Positive for joint swelling and gait problem.  Skin: Positive for color change.  Neurological: Positive for weakness.       Objective:   Physical Exam  Patient's wife and patient's son present to treatment room Patient is responsive to questioning appears orientated 3  Vascular: No peripheral edema noted bilaterally DP pulses 2/4 bilaterally PT pulses 0/4 bilaterally Capillary refill is immediate bilaterally  Neurological: Sensation to 10 g monofilament wire intact 5/5 bilaterally Vibratory sensation reactive right nonreactive left Ankle reflex reactive bilaterally  Dermatological: Dry skin bilaterally No open skin lesions bilaterally The toenails are brittle, hypertrophic, elongated, discolored and tender to direct palpation 6-10  Musculoskeletal: There is no restriction ankle, subtalar, midtarsal joints bilaterally No deformities noted bilaterally      Assessment & Plan:   Assessment: Decreased posterior tibial pulses bilaterally Symptomatic onychomycoses 6-10  Plan: Today review the results of examination with patient and patient's family. I recommend periodic debridement of the nails. Patient verbally consents. The toenails 6-10 are debrided mechanically an electrical without any  Bleeding  Reappoint at patient's request or at 4-6 month intervals

## 2015-11-05 ENCOUNTER — Observation Stay (HOSPITAL_COMMUNITY)
Admission: EM | Admit: 2015-11-05 | Discharge: 2015-11-08 | Disposition: A | Discharge status: Home / Self Care | Payer: Medicare Other | Attending: Internal Medicine | Admitting: Internal Medicine

## 2015-11-05 ENCOUNTER — Encounter (HOSPITAL_COMMUNITY): Payer: Self-pay | Admitting: Emergency Medicine

## 2015-11-05 ENCOUNTER — Emergency Department (HOSPITAL_COMMUNITY): Payer: Medicare Other

## 2015-11-05 DIAGNOSIS — J9610 Chronic respiratory failure, unspecified whether with hypoxia or hypercapnia: Secondary | ICD-10-CM | POA: Insufficient documentation

## 2015-11-05 DIAGNOSIS — Z7982 Long term (current) use of aspirin: Secondary | ICD-10-CM | POA: Diagnosis not present

## 2015-11-05 DIAGNOSIS — R079 Chest pain, unspecified: Secondary | ICD-10-CM | POA: Insufficient documentation

## 2015-11-05 DIAGNOSIS — Z86718 Personal history of other venous thrombosis and embolism: Secondary | ICD-10-CM | POA: Diagnosis not present

## 2015-11-05 DIAGNOSIS — D649 Anemia, unspecified: Secondary | ICD-10-CM | POA: Insufficient documentation

## 2015-11-05 DIAGNOSIS — Z6821 Body mass index (BMI) 21.0-21.9, adult: Secondary | ICD-10-CM | POA: Diagnosis not present

## 2015-11-05 DIAGNOSIS — M1990 Unspecified osteoarthritis, unspecified site: Secondary | ICD-10-CM | POA: Insufficient documentation

## 2015-11-05 DIAGNOSIS — M311 Thrombotic microangiopathy: Secondary | ICD-10-CM | POA: Insufficient documentation

## 2015-11-05 DIAGNOSIS — I3139 Other pericardial effusion (noninflammatory): Secondary | ICD-10-CM | POA: Diagnosis present

## 2015-11-05 DIAGNOSIS — N186 End stage renal disease: Secondary | ICD-10-CM | POA: Diagnosis not present

## 2015-11-05 DIAGNOSIS — R55 Syncope and collapse: Secondary | ICD-10-CM | POA: Diagnosis not present

## 2015-11-05 DIAGNOSIS — E43 Unspecified severe protein-calorie malnutrition: Secondary | ICD-10-CM | POA: Diagnosis not present

## 2015-11-05 DIAGNOSIS — Z9981 Dependence on supplemental oxygen: Secondary | ICD-10-CM | POA: Insufficient documentation

## 2015-11-05 DIAGNOSIS — J439 Emphysema, unspecified: Secondary | ICD-10-CM | POA: Insufficient documentation

## 2015-11-05 DIAGNOSIS — I5022 Chronic systolic (congestive) heart failure: Secondary | ICD-10-CM | POA: Diagnosis present

## 2015-11-05 DIAGNOSIS — A419 Sepsis, unspecified organism: Secondary | ICD-10-CM | POA: Insufficient documentation

## 2015-11-05 DIAGNOSIS — Z992 Dependence on renal dialysis: Secondary | ICD-10-CM | POA: Insufficient documentation

## 2015-11-05 DIAGNOSIS — R071 Chest pain on breathing: Principal | ICD-10-CM | POA: Insufficient documentation

## 2015-11-05 DIAGNOSIS — K219 Gastro-esophageal reflux disease without esophagitis: Secondary | ICD-10-CM | POA: Insufficient documentation

## 2015-11-05 DIAGNOSIS — I313 Pericardial effusion (noninflammatory): Secondary | ICD-10-CM | POA: Diagnosis not present

## 2015-11-05 DIAGNOSIS — R509 Fever, unspecified: Secondary | ICD-10-CM | POA: Insufficient documentation

## 2015-11-05 DIAGNOSIS — R6510 Systemic inflammatory response syndrome (SIRS) of non-infectious origin without acute organ dysfunction: Secondary | ICD-10-CM | POA: Diagnosis not present

## 2015-11-05 DIAGNOSIS — I5042 Chronic combined systolic (congestive) and diastolic (congestive) heart failure: Secondary | ICD-10-CM | POA: Diagnosis not present

## 2015-11-05 DIAGNOSIS — Y95 Nosocomial condition: Secondary | ICD-10-CM | POA: Diagnosis not present

## 2015-11-05 DIAGNOSIS — Z66 Do not resuscitate: Secondary | ICD-10-CM | POA: Diagnosis not present

## 2015-11-05 DIAGNOSIS — I132 Hypertensive heart and chronic kidney disease with heart failure and with stage 5 chronic kidney disease, or end stage renal disease: Secondary | ICD-10-CM | POA: Diagnosis not present

## 2015-11-05 DIAGNOSIS — J9611 Chronic respiratory failure with hypoxia: Secondary | ICD-10-CM | POA: Diagnosis not present

## 2015-11-05 DIAGNOSIS — M3500 Sicca syndrome, unspecified: Secondary | ICD-10-CM | POA: Insufficient documentation

## 2015-11-05 DIAGNOSIS — J8410 Pulmonary fibrosis, unspecified: Secondary | ICD-10-CM | POA: Insufficient documentation

## 2015-11-05 HISTORY — DX: Other specified diseases of anus and rectum: K62.89

## 2015-11-05 HISTORY — DX: Syncope and collapse: R55

## 2015-11-05 HISTORY — DX: Acute embolism and thrombosis of unspecified deep veins of unspecified lower extremity: I82.409

## 2015-11-05 HISTORY — DX: Chronic combined systolic (congestive) and diastolic (congestive) heart failure: I50.42

## 2015-11-05 HISTORY — DX: Abnormal weight loss: R63.4

## 2015-11-05 HISTORY — DX: Anemia, unspecified: D64.9

## 2015-11-05 HISTORY — DX: Chronic respiratory failure, unspecified whether with hypoxia or hypercapnia: J96.10

## 2015-11-05 HISTORY — DX: Minor contusion of unspecified kidney, initial encounter: S37.019A

## 2015-11-05 HISTORY — DX: Essential (primary) hypertension: I10

## 2015-11-05 HISTORY — DX: Unspecified protein-calorie malnutrition: E46

## 2015-11-05 LAB — COMPREHENSIVE METABOLIC PANEL
ALBUMIN: 2.3 g/dL — AB (ref 3.5–5.0)
ALT: 10 U/L — ABNORMAL LOW (ref 17–63)
ANION GAP: 15 (ref 5–15)
AST: 20 U/L (ref 15–41)
Alkaline Phosphatase: 111 U/L (ref 38–126)
BILIRUBIN TOTAL: 0.4 mg/dL (ref 0.3–1.2)
BUN: 15 mg/dL (ref 6–20)
CHLORIDE: 92 mmol/L — AB (ref 101–111)
CO2: 30 mmol/L (ref 22–32)
Calcium: 9.1 mg/dL (ref 8.9–10.3)
Creatinine, Ser: 3.89 mg/dL — ABNORMAL HIGH (ref 0.61–1.24)
GFR calc Af Amer: 16 mL/min — ABNORMAL LOW (ref >60)
GFR calc non Af Amer: 14 mL/min — ABNORMAL LOW (ref >60)
GLUCOSE: 130 mg/dL — AB (ref 65–99)
POTASSIUM: 4.1 mmol/L (ref 3.5–5.1)
Sodium: 137 mmol/L (ref 135–145)
TOTAL PROTEIN: 8.1 g/dL (ref 6.5–8.1)

## 2015-11-05 LAB — CBC
HCT: 38.3 % — ABNORMAL LOW (ref 39.0–52.0)
Hemoglobin: 11.6 g/dL — ABNORMAL LOW (ref 13.0–17.0)
MCH: 28.4 pg (ref 26.0–34.0)
MCHC: 30.3 g/dL (ref 30.0–36.0)
MCV: 93.6 fL (ref 78.0–100.0)
PLATELETS: 252 10*3/uL (ref 150–400)
RBC: 4.09 MIL/uL — ABNORMAL LOW (ref 4.22–5.81)
RDW: 16.5 % — AB (ref 11.5–15.5)
WBC: 12.1 10*3/uL — ABNORMAL HIGH (ref 4.0–10.5)

## 2015-11-05 LAB — I-STAT TROPONIN, ED: TROPONIN I, POC: 0.01 ng/mL (ref 0.00–0.08)

## 2015-11-05 LAB — I-STAT CG4 LACTIC ACID, ED: LACTIC ACID, VENOUS: 2.76 mmol/L — AB (ref 0.5–2.0)

## 2015-11-05 MED ORDER — VANCOMYCIN HCL IN DEXTROSE 1-5 GM/200ML-% IV SOLN
1000.0000 mg | Freq: Once | INTRAVENOUS | Status: AC
  Administered 2015-11-05: 1000 mg via INTRAVENOUS
  Filled 2015-11-05: qty 200

## 2015-11-05 MED ORDER — ACETAMINOPHEN 325 MG PO TABS
650.0000 mg | ORAL_TABLET | Freq: Once | ORAL | Status: AC | PRN
  Administered 2015-11-05: 650 mg via ORAL

## 2015-11-05 MED ORDER — PIPERACILLIN-TAZOBACTAM 3.375 G IVPB 30 MIN
3.3750 g | Freq: Once | INTRAVENOUS | Status: AC
  Administered 2015-11-05: 3.375 g via INTRAVENOUS
  Filled 2015-11-05: qty 50

## 2015-11-05 MED ORDER — SODIUM CHLORIDE 0.9 % IV SOLN
Freq: Once | INTRAVENOUS | Status: AC
  Administered 2015-11-05: 21:00:00 via INTRAVENOUS

## 2015-11-05 MED ORDER — VANCOMYCIN HCL IN DEXTROSE 750-5 MG/150ML-% IV SOLN: 750.0000 mg | INTRAVENOUS | Status: DC

## 2015-11-05 MED ORDER — PIPERACILLIN-TAZOBACTAM IN DEX 2-0.25 GM/50ML IV SOLN
2.2500 g | Freq: Three times a day (TID) | INTRAVENOUS | Status: DC
  Administered 2015-11-06: 2.25 g via INTRAVENOUS
  Filled 2015-11-05 (×4): qty 50

## 2015-11-05 MED ORDER — VANCOMYCIN HCL 500 MG IV SOLR
500.0000 mg | Freq: Once | INTRAVENOUS | Status: AC
  Administered 2015-11-05: 500 mg via INTRAVENOUS
  Filled 2015-11-05: qty 500

## 2015-11-05 MED ORDER — SODIUM CHLORIDE 0.9 % IV BOLUS (SEPSIS)
1000.0000 mL | Freq: Once | INTRAVENOUS | Status: AC
  Administered 2015-11-05: 1000 mL via INTRAVENOUS

## 2015-11-05 NOTE — ED Provider Notes (Addendum)
CSN: [id number]     Arrival date & time 11/05/15  2051 History   First MD Initiated Contact with Patient 11/05/15 2059     Chief Complaint  Patient presents with  . Chest Pain  . Weakness     (Consider location/radiation/quality/duration/timing/severity/associated sxs/prior Treatment) HPI Comments: 75yo M w/ extensive PMH including ESRD on HD, CHF, Sjogren's, pulmonary fibrosis on 2L home O2 who p/w malaise and chest achiness. Patient states that approximately one week ago, he began feeling unwell with generalized weakness and malaise. His symptoms have continued and today he felt worse after his routine dialysis. He has had one episode of vomiting today. No diarrhea or abdominal pain. He reports generalized "aching" across his chest since 9 AM. He states that his chronic shortness of breath has been mildly worse over the past few weeks. He denies any cough or cold symptoms. He has not noticed any fevers at home. No problems at his fistula site.  Patient is a 75 y.o. male presenting with chest pain and weakness. The history is provided by the patient.  Chest Pain Associated symptoms: weakness   Weakness Associated symptoms include chest pain.    Past Medical History  Diagnosis Date  . Medical history non-contributory   . Arthritis   . Pneumonia   . GERD (gastroesophageal reflux disease)   . Sjogren's disease ([place]) 01/28/2015  . Pulmonary fibrosis ([place])   . ESRD on hemodialysis [facility]) June 2016    had severe renal failure May-June 2016 with TMA on biopsy, felt to be idiopathic. Received plasma exchange and steroids but didn't respond and ended up starting hemodialysis June 2016.   Marland Kitchen Acute on chronic diastolic CHF (congestive heart failure) ([place]) 04/18/2015  . Accelerated hypertension 04/19/2015   Past Surgical History  Procedure Laterality Date  . Hemorroidectomy  1999  . Surgical procedure to remove a mole as a child Right Eye area    At around 49 years old  . Insertion of dialysis  catheter N/A 02/17/2015    Procedure: INSERTION OF DIALYSIS CATHETER RIGHT INTERNAL JUGULAR VEIN;  Surgeon: Elam Dutch, MD;  Location: [place];  Service: Vascular;  Laterality: N/A;  . Av fistula placement Right 02/17/2015    Procedure: INSERTION OF RIGHT ARM  ARTERIOVENOUS (AV) GORE-TEX GRAFT ;  Surgeon: Elam Dutch, MD;  Location: [place];  Service: Vascular;  Laterality: Right;  . Flexible sigmoidoscopy N/A 08/04/2015    Procedure: FLEXIBLE SIGMOIDOSCOPY;  Surgeon: Wilford Corner, MD;  Location: [facility] ENDOSCOPY;  Service: Endoscopy;  Laterality: N/A;   Family History  Problem Relation Age of Onset  . Hypertension Mother   . Healthy Father   . Hypertension Sister   . Hypertension Brother    Social History  Substance Use Topics  . Smoking status: Never Smoker   . Smokeless tobacco: Never Used  . Alcohol Use: No    Review of Systems  Cardiovascular: Positive for chest pain.  Neurological: Positive for weakness.   10 Systems reviewed and are negative for acute change except as noted in the HPI.    Allergies  Review of patient's allergies indicates no known allergies.  Home Medications   Prior to Admission medications   Medication Sig Start Date End Date Taking? Authorizing Provider  aspirin EC 81 MG EC tablet Take 1 tablet (81 mg total) by mouth daily. 04/23/15  Yes Reyne Dumas, MD  carvedilol (COREG) 3.125 MG tablet Take 1 tablet by mouth 2 (two) times daily. 07/24/15  Yes Historical Provider,  MD  [facility] TUSSIN DM 100-10 MG/5ML liquid Take 5 mLs by mouth every other day as needed for cough.  08/04/15  Yes Historical Provider, MD  cyanocobalamin 500 MCG tablet Take 1 tablet (500 mcg total) by mouth daily. 12/26/14  Yes Donne Hazel, MD  hydrOXYzine (VISTARIL) 25 MG capsule Take 25 mg by mouth at bedtime as needed (sleep. May repeat 2 hours later if necessary).   Yes Historical Provider, MD  multivitamin (RENA-VIT) TABS tablet Take 1 tablet by mouth daily.   Yes Historical  Provider, MD  omeprazole (PRILOSEC) 20 MG capsule TAKE 1 CAPSULE (20 MG TOTAL) BY MOUTH DAILY. 07/31/15  Yes Historical Provider, MD  sevelamer carbonate (RENVELA) 800 MG tablet Take 2 tablets (1,600 mg total) by mouth 3 (three) times daily with meals. 05/17/15  Yes Cherene Altes, MD  atorvastatin (LIPITOR) 40 MG tablet Take 1 tablet (40 mg total) by mouth daily at 6 PM. Patient not taking: Reported on 11/05/2015 04/23/15   Reyne Dumas, MD   BP 123/70 mmHg  Pulse 93  Temp(Src) 99.6 F (37.6 C) (Oral)  Resp 25  Ht 5\' 10"  (1.778 m)  Wt 136 lb (61.689 kg)  BMI 19.51 kg/m2  SpO2 100% Physical Exam  Constitutional: He is oriented to person, place, and time. No distress.  Thin, chronically ill appearing, awake and alert  HENT:  Head: Normocephalic and atraumatic.  Mouth/Throat: Oropharynx is clear and moist.  Moist mucous membranes  Eyes: Conjunctivae are normal. Pupils are equal, round, and reactive to light.  Neck: Neck supple.  Cardiovascular: Regular rhythm and normal heart sounds.  Tachycardia present.   No murmur heard. Pulmonary/Chest: No respiratory distress.  Tachypnea w/ mildly increased WOB, crackles and diminished BS b/l bases  Abdominal: Soft. Bowel sounds are normal. He exhibits no distension. There is no tenderness.  Musculoskeletal: He exhibits no edema.  Neurological: He is alert and oriented to person, place, and time.  Fluent speech  Skin: Skin is warm. He is diaphoretic.  Psychiatric: He has a normal mood and affect. Judgment normal.  Nursing note and vitals reviewed.   ED Course  .Critical Care Performed by: Sharlett Iles Authorized by: Sharlett Iles Total critical care time: 45 minutes Critical care time was exclusive of separately billable procedures and treating other patients. Critical care was necessary to treat or prevent imminent or life-threatening deterioration of the following conditions: sepsis. Critical care was time spent  personally by me on the following activities: development of treatment plan with patient or surrogate, evaluation of patient's response to treatment, examination of patient, obtaining history from patient or surrogate, ordering and performing treatments and interventions, ordering and review of laboratory studies, ordering and review of radiographic studies, pulse oximetry, re-evaluation of patient's condition and review of old charts.   (including critical care time) Labs Review Labs Reviewed  CBC - Abnormal; Notable for the following:    WBC 12.1 (*)    RBC 4.09 (*)    Hemoglobin 11.6 (*)    HCT 38.3 (*)    RDW 16.5 (*)    All other components within normal limits  COMPREHENSIVE METABOLIC PANEL - Abnormal; Notable for the following:    Chloride 92 (*)    Glucose, Bld 130 (*)    Creatinine, Ser 3.89 (*)    Albumin 2.3 (*)    ALT 10 (*)    GFR calc non Af Amer 14 (*)    GFR calc Af Amer 16 (*)  All other components within normal limits  I-STAT CG4 LACTIC ACID, ED - Abnormal; Notable for the following:    Lactic Acid, Venous 2.76 (*)    All other components within normal limits  CULTURE, BLOOD (ROUTINE X 2)  CULTURE, BLOOD (ROUTINE X 2)  URINE CULTURE  URINALYSIS, ROUTINE W REFLEX MICROSCOPIC (NOT AT [facility])  INFLUENZA PANEL BY PCR (TYPE A & B, H1N1)  I-STAT TROPOININ, ED    Imaging Review Dg Chest Portable 1 View  11/05/2015  CLINICAL DATA:  Chest pain for a day. EXAM: PORTABLE CHEST 1 VIEW COMPARISON:  08/19/2015 FINDINGS: 2136 hours. The lungs are clear wiithout focal pneumonia, edema, pneumothorax or pleural effusion. Interstitial markings are diffusely coarsened with chronic features. Hyperexpansion suggests emphysema. The cardio pericardial silhouette is enlarged. The visualized bony structures of the thorax are intact. Telemetry leads overlie the chest. IMPRESSION: Cardiomegaly with emphysema.  No acute cardiopulmonary findings. Electronically Signed   By: Misty Stanley M.D.    On: 11/05/2015 21:43   I have personally reviewed and evaluated these lab results as part of my medical decision-making.   EKG Interpretation   Date/Time:  Wednesday November 05 2015 20:54:57 EST Ventricular Rate:  110 PR Interval:  164 QRS Duration: 90 QT Interval:  284 QTC Calculation: 384 R Axis:   -32 Text Interpretation:  Sinus tachycardia Left axis deviation sinus  tachycardia new from previous Otherwise no significant change Confirmed by  Versie Soave MD, Latravion Graves [phone]) on 11/05/2015 9:03:05 PM     Medications  vancomycin (VANCOCIN) IVPB 1000 mg/200 mL premix (1,000 mg Intravenous New Bag/Given 11/05/15 2230)  piperacillin-tazobactam (ZOSYN) IVPB 2.25 g (not administered)  vancomycin (VANCOCIN) 500 mg in sodium chloride 0.9 % 100 mL IVPB (500 mg Intravenous New Bag/Given 11/05/15 2256)  vancomycin (VANCOCIN) IVPB 750 mg/150 ml premix (not administered)  0.9 %  sodium chloride infusion ( Intravenous Stopped 11/05/15 2234)  0.9 %  sodium chloride infusion ( Intravenous New Bag/Given 11/05/15 2127)  acetaminophen (TYLENOL) tablet 650 mg (650 mg Oral Given 11/05/15 2127)  piperacillin-tazobactam (ZOSYN) IVPB 3.375 g (0 g Intravenous Stopped 11/05/15 2300)  sodium chloride 0.9 % bolus 1,000 mL (1,000 mLs Intravenous New Bag/Given 11/05/15 2234)    MDM   Final diagnoses:  Sepsis, due to unspecified organism (HCC)  Chest pain, unspecified chest pain type  dehydration   Pt w/ ESRD on HD p/w ongoing fatigue, generalized weakness, and not feeling well. He also reports achiness across his chest today. On arrival the patient was febrile to 101.1, tachycardic at 107. He was in the mid 90s on 2 L nasal cannula. Crackles and diminished breath sounds in bilateral bases, no respiratory distress. Because of abnormal vital signs and extensive comorbidities, initiated a code sepsis with blood and urine cultures, vancomycin and Zosyn.Gave IVF bolus 2L and tylenol for fever.  Labs show initial lactate  2.76, WBC 12.1, troponin 0.01. Influenza PCR has been ordered. On reexamination, the patient is comfortable and remains stable on 2 L nasal cannula. Discussed admission with [facility] hospitalist, Dr. Roel Cluck, given fever and lactic acidosis. Pt admitted for further evaluation.  Sharlett Iles, MD 11/05/15 [place], MD 11/06/15 [phone]

## 2015-11-05 NOTE — ED Notes (Addendum)
Report from [facility]> C/o generalized aching across chest since 9am.  Pt had dialysis today and reports that he received a Hepatitis B shot.  Pt reports generalized weakness and not feeling well since being back home from dialysis.  Vomited x 1 pta.  EMS reports pt received ASA 324 mg prior to their arrival.  Son reports pt took "baby Tylenol" at home prior to EMS.  Son is attempting to contact pt's wife to see if pt had Tylenol or ASA.

## 2015-11-05 NOTE — Progress Notes (Signed)
Pharmacy Antibiotic Note  Travis [name] is a 75 y.o. male admitted on 11/05/2015 with sepsis.  Pharmacy has been consulted for vancomycin and zosyn dosing. He is ESRD on HD MWF - last session was earlier today.  Goal: Vancomycin pre HD 15-25  Plan: 1) Vancomycin 1500mg  total loading dose then 750mg  qHD MWF 2) Zosyn 2.25g q8 3) Follow HD schedule/tolerance, cultures, LOT, level if needed  Weight: 137 lb 2 oz (62.2 kg)  Temp (24hrs), Avg:101.1 F (38.4 C), Min:101.1 F (38.4 C), Max:101.1 F (38.4 C)   Recent Labs Lab 11/05/15 2130 11/05/15 2148  WBC 12.1*  --   LATICACIDVEN  --  2.76*    CrCl cannot be calculated (Patient has no serum creatinine result on file.).    No Known Allergies  Antimicrobials this admission: 2/22 Vancomycin>> 2/22 Zosyn>>  Dose adjustments this admission n/a  Microbiology results: n/a  Thank you for allowing pharmacy to be a part of this patient's care.  Deboraha Sprang 11/05/2015 10:14 PM

## 2015-11-05 NOTE — ED Notes (Signed)
Wife reported pt took Baby ASA per communications instructions.

## 2015-11-05 NOTE — H&P (Addendum)
PCP: [name], MD  Pulmonology [facility]  Referring provider Dr. Rex Kras   Chief Complaint: Fever nausea and vomiting  HPI: Travis [name] is a 75 y.o. male   has a past medical history of Medical history non-contributory; Arthritis; Pneumonia; GERD (gastroesophageal reflux disease); Sjogren's disease ([place]) (01/28/2015); Pulmonary fibrosis ([place]); ESRD on hemodialysis [facility]) (June 2016); Acute on chronic diastolic CHF (congestive heart failure) ([place]) (04/18/2015); and Accelerated hypertension (04/19/2015).   Presented with aching chest pain for 2-3 weeks, worse with taking deep breaths  he had dialysis today and felt somewhat unwell afterwards he has been overall having fatigue he nausea and vomiting episode times once not associated with diarrhea.  and no abdominal pain.. Patient called EMS in the Route received aspirin 324 as well as AB aspirin was given by family. He reports not feeling well for at least a week now. Patient baseline on 2 L of oxygen. He has chronic shortness of breath which has not changed no urinary symptoms.  IN ER:  Albumin 2.3 on 12.1 hemoglobin 11.6 troponin 0.01 collected at 9:45 PM lactic acid 2.76 collected at 2148 influenza PCR ordered,     Regarding pertinent past history: Patient had end-stage renal disease secondary TMA on dialysis since June 2016 on hemodialysis Monday Wednesday Friday through right side fistula was dialysis was today which went well but patient did feel somewhat lightheaded thereafter. Patient has history of pulmonary fibrosis and Sojourn's disease followed by pulmonology also has history of chronic systolic and diastolic heart failure. His Coreg to. Used due to hypotension and syncope after dialysis in August  Hospitalist was called for admission for sepsis and chest pain  Review of Systems:    Pertinent positives include: Fevers, chills, chest pain, shortness of breath at rest. dyspnea on exertion  Constitutional:  No  weight loss, night sweats,  fatigue, weight loss  HEENT:  No headaches, Difficulty swallowing,Tooth/dental problems,Sore throat,  No sneezing, itching, ear ache, nasal congestion, post nasal drip,  Cardio-vascular:  NoOrthopnea, PND, anasarca, dizziness, palpitations.no Bilateral lower extremity swelling  GI:  No heartburn, indigestion, abdominal pain, nausea, vomiting, diarrhea, change in bowel habits, loss of appetite, melena, blood in stool, hematemesis Resp:  no  No , No excess mucus, no productive cough, No non-productive cough, No coughing up of blood.No change in color of mucus.No wheezing. Skin:  no rash or lesions. No jaundice GU:  no dysuria, change in color of urine, no urgency or frequency. No straining to urinate.  No flank pain.  Musculoskeletal:  No joint pain or no joint swelling. No decreased range of motion. No back pain.  Psych:  No change in mood or affect. No depression or anxiety. No memory loss.  Neuro: no localizing neurological complaints, no tingling, no weakness, no double vision, no gait abnormality, no slurred speech, no confusion  Otherwise ROS are negative except for above, 10 systems were reviewed  Past Medical History: Past Medical History  Diagnosis Date  . Medical history non-contributory   . Arthritis   . Pneumonia   . GERD (gastroesophageal reflux disease)   . Sjogren's disease ([place]) 01/28/2015  . Pulmonary fibrosis ([place])   . ESRD on hemodialysis [facility]) June 2016    had severe renal failure May-June 2016 with TMA on biopsy, felt to be idiopathic. Received plasma exchange and steroids but didn't respond and ended up starting hemodialysis June 2016.   Marland Kitchen Acute on chronic diastolic CHF (congestive heart failure) ([place]) 04/18/2015  . Accelerated hypertension 04/19/2015  Past Surgical History  Procedure Laterality Date  . Hemorroidectomy  1999  . Surgical procedure to remove a mole as a child Right Eye area    At around 16 years old  . Insertion of  dialysis catheter N/A 02/17/2015    Procedure: INSERTION OF DIALYSIS CATHETER RIGHT INTERNAL JUGULAR VEIN;  Surgeon: Elam Dutch, MD;  Location: [place];  Service: Vascular;  Laterality: N/A;  . Av fistula placement Right 02/17/2015    Procedure: INSERTION OF RIGHT ARM  ARTERIOVENOUS (AV) GORE-TEX GRAFT ;  Surgeon: Elam Dutch, MD;  Location: [place];  Service: Vascular;  Laterality: Right;  . Flexible sigmoidoscopy N/A 08/04/2015    Procedure: FLEXIBLE SIGMOIDOSCOPY;  Surgeon: Wilford Corner, MD;  Location: [facility] ENDOSCOPY;  Service: Endoscopy;  Laterality: N/A;     Medications: Prior to Admission medications   Medication Sig Start Date End Date Taking? Authorizing Provider  aspirin EC 81 MG EC tablet Take 1 tablet (81 mg total) by mouth daily. 04/23/15  Yes Reyne Dumas, MD  carvedilol (COREG) 3.125 MG tablet Take 1 tablet by mouth 2 (two) times daily. 07/24/15  Yes Historical Provider, MD  [facility] TUSSIN DM 100-10 MG/5ML liquid Take 5 mLs by mouth every other day as needed for cough.  08/04/15  Yes Historical Provider, MD  cyanocobalamin 500 MCG tablet Take 1 tablet (500 mcg total) by mouth daily. 12/26/14  Yes Donne Hazel, MD  hydrOXYzine (VISTARIL) 25 MG capsule Take 25 mg by mouth at bedtime as needed (sleep. May repeat 2 hours later if necessary).   Yes Historical Provider, MD  multivitamin (RENA-VIT) TABS tablet Take 1 tablet by mouth daily.   Yes Historical Provider, MD  omeprazole (PRILOSEC) 20 MG capsule TAKE 1 CAPSULE (20 MG TOTAL) BY MOUTH DAILY. 07/31/15  Yes Historical Provider, MD  sevelamer carbonate (RENVELA) 800 MG tablet Take 2 tablets (1,600 mg total) by mouth 3 (three) times daily with meals. 05/17/15  Yes Cherene Altes, MD  atorvastatin (LIPITOR) 40 MG tablet Take 1 tablet (40 mg total) by mouth daily at 6 PM. Patient not taking: Reported on 11/05/2015 04/23/15   Reyne Dumas, MD    Allergies:  No Known Allergies  Social History:  Ambulatory   independently   Lives  at home  With family     reports that he has never smoked. He has never used smokeless tobacco. He reports that he does not drink alcohol or use illicit drugs.     Family History: family history includes Healthy in his father; Hypertension in his brother, mother, and sister.    Physical Exam: Patient Vitals for the past 24 hrs:  BP Temp Temp src Pulse Resp SpO2 Height Weight  11/05/15 2245 119/70 mmHg - - 98 19 100 % - -  11/05/15 2238 - 99.6 F (37.6 C) Oral - - - - -  11/05/15 2234 - - - - - - 5\' 10"  (1.778 m) 61.689 kg (136 lb)  11/05/15 2230 122/75 mmHg - - - (!) 31 100 % - -  11/05/15 2215 128/76 mmHg - - - 24 100 % - -  11/05/15 2200 135/83 mmHg - - - (!) 30 - - -  11/05/15 2145 137/82 mmHg - - - 26 - - -  11/05/15 2130 143/80 mmHg - - - 25 - - -  11/05/15 2121 - - - - - - - 62.2 kg (137 lb 2 oz)  11/05/15 2115 131/79 mmHg - - 107 19 100 % - -  11/05/15 2108 - - - - - 98 % - -  11/05/15 2105 125/82 mmHg 101.1 F (38.4 C) Oral 107 26 91 % - -  11/05/15 2100 125/82 mmHg - - 109 (!) 31 100 % - -    1. General:  in No Acute distress 2. Psychological: Alert and   Oriented 3. Head/ENT:    Dry Mucous Membranes                          Head Non traumatic, neck supple                          Normal   Dentition 4. SKIN:  decreased Skin turgor,  Skin clean Dry leatherly 5. Heart: Regular rate and rhythm no  Murmur, Rub or gallop 6. Lungs:   no wheezes fine crackles  bilateral 7. Abdomen: Soft, non-tender, Non distended 8. Lower extremities: no clubbing, cyanosis, or edema 9. Neurologically Grossly intact, moving all 4 extremities equally 10. MSK: Normal range of motion  body mass index is 19.51 kg/(m^2).   Labs on Admission:   Results for orders placed or performed during the hospital encounter of 11/05/15 (from the past 24 hour(s))  CBC     Status: Abnormal   Collection Time: 11/05/15  9:30 PM  Result Value Ref Range   WBC 12.1 (H) 4.0 - 10.5 K/uL   RBC 4.09 (L) 4.22  - 5.81 MIL/uL   Hemoglobin 11.6 (L) 13.0 - 17.0 g/dL   HCT 38.3 (L) 39.0 - 52.0 %   MCV 93.6 78.0 - 100.0 fL   MCH 28.4 26.0 - 34.0 pg   MCHC 30.3 30.0 - 36.0 g/dL   RDW 16.5 (H) 11.5 - 15.5 %   Platelets 252 150 - 400 K/uL  Comprehensive metabolic panel     Status: Abnormal   Collection Time: 11/05/15  9:30 PM  Result Value Ref Range   Sodium 137 135 - 145 mmol/L   Potassium 4.1 3.5 - 5.1 mmol/L   Chloride 92 (L) 101 - 111 mmol/L   CO2 30 22 - 32 mmol/L   Glucose, Bld 130 (H) 65 - 99 mg/dL   BUN 15 6 - 20 mg/dL   Creatinine, Ser 3.89 (H) 0.61 - 1.24 mg/dL   Calcium 9.1 8.9 - 10.3 mg/dL   Total Protein 8.1 6.5 - 8.1 g/dL   Albumin 2.3 (L) 3.5 - 5.0 g/dL   AST 20 15 - 41 U/L   ALT 10 (L) 17 - 63 U/L   Alkaline Phosphatase 111 38 - 126 U/L   Total Bilirubin 0.4 0.3 - 1.2 mg/dL   GFR calc non Af Amer 14 (L) >60 mL/min   GFR calc Af Amer 16 (L) >60 mL/min   Anion gap 15 5 - 15  I-stat troponin, ED (not at [facility], [facility])     Status: None   Collection Time: 11/05/15  9:45 PM  Result Value Ref Range   Troponin i, poc 0.01 0.00 - 0.08 ng/mL   Comment 3          I-Stat CG4 Lactic Acid, ED (Not at [facility])     Status: Abnormal   Collection Time: 11/05/15  9:48 PM  Result Value Ref Range   Lactic Acid, Venous 2.76 (HH) 0.5 - 2.0 mmol/L   Comment NOTIFIED PHYSICIAN     UA anuric  Lab Results  Component Value Date   HGBA1C  4.9 01/30/2015    Estimated Creatinine Clearance: 14.8 mL/min (by C-G formula based on Cr of 3.89).  BNP (last 3 results) No results for input(s): PROBNP in the last 8760 hours.  Other results:  I have pearsonaly reviewed this: ECG REPORT  Rate:110  Rhythm: sinus tachycardia ST&T Change: no ischemic changes QTC 384  Filed Weights   11/05/15 2121 11/05/15 2234  Weight: 62.2 kg (137 lb 2 oz) 61.689 kg (136 lb)     Cultures:    Component Value Date/Time   SDES BLOOD LEFT ANTECUBITAL 05/14/2015 0409   SPECREQUEST BOTTLES DRAWN AEROBIC ONLY 5CC  05/14/2015 0409   CULT NO GROWTH 5 DAYS 05/14/2015 0409   REPTSTATUS 05/19/2015 FINAL 05/14/2015 0409     Radiological Exams on Admission: Dg Chest Portable 1 View  11/05/2015  CLINICAL DATA:  Chest pain for a day. EXAM: PORTABLE CHEST 1 VIEW COMPARISON:  08/19/2015 FINDINGS: 2136 hours. The lungs are clear wiithout focal pneumonia, edema, pneumothorax or pleural effusion. Interstitial markings are diffusely coarsened with chronic features. Hyperexpansion suggests emphysema. The cardio pericardial silhouette is enlarged. The visualized bony structures of the thorax are intact. Telemetry leads overlie the chest. IMPRESSION: Cardiomegaly with emphysema.  No acute cardiopulmonary findings. Electronically Signed   By: Misty Stanley M.D.   On: 11/05/2015 21:43    Chart has been reviewed  Family  at  Bedside  plan of care was discussed with  Wife Standly Shames [phone]  Assessment/Plan  75 year old gentleman with history of   Sjogren syndrome, pulmonary fibrosis, chronic  systolic heart failure presents with pleuritic chest pain for past 2 weeks with feeling of fatigue and today had an episode of nausea vomiting and fever  Present on Admission:  . Pulmonary fibrosis ([place]) continue continue liters of oxygen at baseline we'll continue him  . Protein-calorie malnutrition, severe ([place]) Will obtain prealbumin and if  needed nutritional consult  . Chronic respiratory failure (HCC) - continue 2 L of oxygen  . Chronic systolic CHF (congestive heart failure), NYHA class 2 (HCC) - currently appears to be euvolemic fluid maintained by dialysis patient is an uric  . SIRS (systemic inflammatory response syndrome) (HCC) - influenza PCR ordered blood cultures ordered for now continue broad-spectrum antibiotics for serial lactic acid Reticulocyte chest pain we will admit to telemetry cycle cardiac enzymes obtain CT angiogram of the chest contrast has been cleared by nephrology End-stage renal disease  discussed with nephrology is here still on Wednesday will need dialysis. CT scan showed pericardial effusion worse from prior. Patient is comfortable does not appear to be in any distress hemodynamically stable. Will obtain echogram in the morning, cardiology consult in AM Prophylaxis:   SCD  CODE STATUS:   DNR/DNI as per patient   Disposition:   To home once workup is complete and patient is stable  Other plan as per orders.  I have spent a total of 63  min on this admission    extra time was spent to discuss case with Dr. Justin Mend with nephrology  [facility] 11/05/2015, 11:59 PM    [facility]  Pager [phone]   after 2 AM please page floor coverage PA If 7AM-7PM, please contact the day team taking care of the patient  [email or web address]  Password [email or web address]

## 2015-11-05 NOTE — Progress Notes (Signed)
Pharmacy Code Sepsis Protocol  Time of code sepsis page: 2138 []  Antibiotics delivered at 2155  Were antibiotics ordered at the time of the code sepsis page? Yes Was it required to contact the physician? [x]  Physician not contacted []  Physician contacted to order antibiotics for code sepsis []  Physician contacted to recommend changing antibiotics  Pharmacy consulted for: vanc/zosyn  Anti-infectives    Start     Dose/Rate Route Frequency Ordered Stop   11/05/15 2130  piperacillin-tazobactam (ZOSYN) IVPB 3.375 g     3.375 g 100 mL/hr over 30 Minutes Intravenous  Once 11/05/15 2129     11/05/15 2130  vancomycin (VANCOCIN) IVPB 1000 mg/200 mL premix     1,000 mg 200 mL/hr over 60 Minutes Intravenous  Once 11/05/15 2129          Nurse education provided: [x]  Minutes left to administer antibiotics to achieve 1 hour goal [x]  Correct order of antibiotic administration [x]  Antibiotic Y-site compatibilities    Joya San, PharmD Clinical Pharmacy Resident Pager # [phone] 11/05/2015 9:56 PM

## 2015-11-06 ENCOUNTER — Encounter (HOSPITAL_COMMUNITY): Admission: RE | Payer: Self-pay | Source: Ambulatory Visit

## 2015-11-06 ENCOUNTER — Observation Stay (HOSPITAL_COMMUNITY): Payer: Medicare Other

## 2015-11-06 ENCOUNTER — Ambulatory Visit (HOSPITAL_COMMUNITY): Admission: RE | Admit: 2015-11-06 | Payer: Medicare Other | Source: Ambulatory Visit | Admitting: Gastroenterology

## 2015-11-06 ENCOUNTER — Encounter (HOSPITAL_COMMUNITY): Payer: Self-pay | Admitting: General Practice

## 2015-11-06 DIAGNOSIS — N186 End stage renal disease: Secondary | ICD-10-CM

## 2015-11-06 DIAGNOSIS — J9610 Chronic respiratory failure, unspecified whether with hypoxia or hypercapnia: Secondary | ICD-10-CM

## 2015-11-06 DIAGNOSIS — I313 Pericardial effusion (noninflammatory): Secondary | ICD-10-CM | POA: Diagnosis present

## 2015-11-06 DIAGNOSIS — Z992 Dependence on renal dialysis: Secondary | ICD-10-CM | POA: Diagnosis not present

## 2015-11-06 DIAGNOSIS — I319 Disease of pericardium, unspecified: Secondary | ICD-10-CM | POA: Diagnosis not present

## 2015-11-06 DIAGNOSIS — E43 Unspecified severe protein-calorie malnutrition: Secondary | ICD-10-CM

## 2015-11-06 DIAGNOSIS — J8410 Pulmonary fibrosis, unspecified: Secondary | ICD-10-CM | POA: Diagnosis not present

## 2015-11-06 DIAGNOSIS — J9611 Chronic respiratory failure with hypoxia: Secondary | ICD-10-CM

## 2015-11-06 DIAGNOSIS — I3139 Other pericardial effusion (noninflammatory): Secondary | ICD-10-CM | POA: Diagnosis present

## 2015-11-06 DIAGNOSIS — I5022 Chronic systolic (congestive) heart failure: Secondary | ICD-10-CM | POA: Diagnosis not present

## 2015-11-06 LAB — PHOSPHORUS: PHOSPHORUS: 4.4 mg/dL (ref 2.5–4.6)

## 2015-11-06 LAB — COMPREHENSIVE METABOLIC PANEL
ALK PHOS: 86 U/L (ref 38–126)
ALT: 8 U/L — AB (ref 17–63)
AST: 17 U/L (ref 15–41)
Albumin: 2 g/dL — ABNORMAL LOW (ref 3.5–5.0)
Anion gap: 13 (ref 5–15)
BILIRUBIN TOTAL: 0.5 mg/dL (ref 0.3–1.2)
BUN: 21 mg/dL — AB (ref 6–20)
CO2: 25 mmol/L (ref 22–32)
CREATININE: 4.45 mg/dL — AB (ref 0.61–1.24)
Calcium: 8.9 mg/dL (ref 8.9–10.3)
Chloride: 97 mmol/L — ABNORMAL LOW (ref 101–111)
GFR calc Af Amer: 14 mL/min — ABNORMAL LOW (ref >60)
GFR, EST NON AFRICAN AMERICAN: 12 mL/min — AB (ref >60)
Glucose, Bld: 102 mg/dL — ABNORMAL HIGH (ref 65–99)
POTASSIUM: 4.2 mmol/L (ref 3.5–5.1)
Sodium: 135 mmol/L (ref 135–145)
TOTAL PROTEIN: 7.1 g/dL (ref 6.5–8.1)

## 2015-11-06 LAB — CBC
HCT: 33 % — ABNORMAL LOW (ref 39.0–52.0)
Hemoglobin: 10.3 g/dL — ABNORMAL LOW (ref 13.0–17.0)
MCH: 29.9 pg (ref 26.0–34.0)
MCHC: 31.2 g/dL (ref 30.0–36.0)
MCV: 95.7 fL (ref 78.0–100.0)
PLATELETS: 184 10*3/uL (ref 150–400)
RBC: 3.45 MIL/uL — ABNORMAL LOW (ref 4.22–5.81)
RDW: 16.4 % — AB (ref 11.5–15.5)
WBC: 10.5 10*3/uL (ref 4.0–10.5)

## 2015-11-06 LAB — TSH: TSH: 5.798 u[IU]/mL — ABNORMAL HIGH (ref 0.350–4.500)

## 2015-11-06 LAB — MAGNESIUM: Magnesium: 1.7 mg/dL (ref 1.7–2.4)

## 2015-11-06 LAB — INFLUENZA PANEL BY PCR (TYPE A & B)
H1N1FLUPCR: NOT DETECTED
INFLBPCR: NEGATIVE
Influenza A By PCR: NEGATIVE

## 2015-11-06 LAB — C-REACTIVE PROTEIN: CRP: 14.7 mg/dL — ABNORMAL HIGH (ref <1.0)

## 2015-11-06 LAB — I-STAT CG4 LACTIC ACID, ED: Lactic Acid, Venous: 1.9 mmol/L (ref 0.5–2.0)

## 2015-11-06 LAB — TROPONIN I: TROPONIN I: 0.05 ng/mL — AB (ref <0.031)

## 2015-11-06 LAB — SEDIMENTATION RATE: Sed Rate: 110 mm/hr — ABNORMAL HIGH (ref 0–16)

## 2015-11-06 LAB — PREALBUMIN: Prealbumin: 15.6 mg/dL — ABNORMAL LOW (ref 18–38)

## 2015-11-06 SURGERY — COLONOSCOPY WITH PROPOFOL
Anesthesia: Monitor Anesthesia Care

## 2015-11-06 MED ORDER — IOHEXOL 350 MG/ML SOLN
100.0000 mL | Freq: Once | INTRAVENOUS | Status: AC | PRN
  Administered 2015-11-06: 100 mL via INTRAVENOUS

## 2015-11-06 MED ORDER — HYDROCODONE-ACETAMINOPHEN 5-325 MG PO TABS: 1.0000 | ORAL_TABLET | ORAL | Status: DC | PRN

## 2015-11-06 MED ORDER — CARVEDILOL 3.125 MG PO TABS
3.1250 mg | ORAL_TABLET | Freq: Two times a day (BID) | ORAL | Status: DC
  Administered 2015-11-06 – 2015-11-08 (×5): 3.125 mg via ORAL
  Filled 2015-11-06 (×5): qty 1

## 2015-11-06 MED ORDER — ACETAMINOPHEN 325 MG PO TABS: 650.0000 mg | ORAL_TABLET | Freq: Four times a day (QID) | ORAL | Status: DC | PRN

## 2015-11-06 MED ORDER — PANTOPRAZOLE SODIUM 40 MG PO TBEC
40.0000 mg | DELAYED_RELEASE_TABLET | Freq: Every day | ORAL | Status: DC
  Administered 2015-11-06 – 2015-11-08 (×3): 40 mg via ORAL
  Filled 2015-11-06 (×3): qty 1

## 2015-11-06 MED ORDER — SODIUM CHLORIDE 0.9% FLUSH: 3.0000 mL | Freq: Two times a day (BID) | INTRAVENOUS | Status: DC

## 2015-11-06 MED ORDER — DARBEPOETIN ALFA 100 MCG/0.5ML IJ SOSY: 100.0000 ug | PREFILLED_SYRINGE | INTRAMUSCULAR | Status: DC

## 2015-11-06 MED ORDER — SODIUM CHLORIDE 0.9 % IV SOLN: 250.0000 mL | INTRAVENOUS | Status: DC | PRN

## 2015-11-06 MED ORDER — ONDANSETRON HCL 4 MG PO TABS: 4.0000 mg | ORAL_TABLET | Freq: Four times a day (QID) | ORAL | Status: DC | PRN

## 2015-11-06 MED ORDER — COLCHICINE 0.6 MG PO TABS
0.6000 mg | ORAL_TABLET | Freq: Every day | ORAL | Status: DC
  Administered 2015-11-06 – 2015-11-08 (×3): 0.6 mg via ORAL
  Filled 2015-11-06 (×3): qty 1

## 2015-11-06 MED ORDER — ACETAMINOPHEN 650 MG RE SUPP: 650.0000 mg | Freq: Four times a day (QID) | RECTAL | Status: DC | PRN

## 2015-11-06 MED ORDER — SODIUM CHLORIDE 0.9% FLUSH
3.0000 mL | Freq: Two times a day (BID) | INTRAVENOUS | Status: DC
  Administered 2015-11-07 – 2015-11-08 (×3): 3 mL via INTRAVENOUS

## 2015-11-06 MED ORDER — HYDROXYZINE PAMOATE 25 MG PO CAPS
25.0000 mg | ORAL_CAPSULE | Freq: Every evening | ORAL | Status: DC | PRN
  Filled 2015-11-06: qty 1

## 2015-11-06 MED ORDER — SEVELAMER CARBONATE 800 MG PO TABS
1600.0000 mg | ORAL_TABLET | Freq: Three times a day (TID) | ORAL | Status: DC
  Administered 2015-11-06 – 2015-11-08 (×6): 1600 mg via ORAL
  Filled 2015-11-06 (×7): qty 2

## 2015-11-06 MED ORDER — SODIUM CHLORIDE 0.9% FLUSH: 3.0000 mL | INTRAVENOUS | Status: DC | PRN

## 2015-11-06 MED ORDER — ASPIRIN EC 81 MG PO TBEC
81.0000 mg | DELAYED_RELEASE_TABLET | Freq: Every day | ORAL | Status: DC
  Administered 2015-11-06 – 2015-11-08 (×3): 81 mg via ORAL
  Filled 2015-11-06 (×3): qty 1

## 2015-11-06 MED ORDER — SODIUM CHLORIDE 0.9 % IV SOLN
125.0000 mg | INTRAVENOUS | Status: DC
  Administered 2015-11-07: 125 mg via INTRAVENOUS
  Filled 2015-11-06 (×2): qty 10

## 2015-11-06 MED ORDER — HEPARIN SODIUM (PORCINE) 5000 UNIT/ML IJ SOLN
5000.0000 [IU] | Freq: Three times a day (TID) | INTRAMUSCULAR | Status: DC
  Administered 2015-11-06 – 2015-11-08 (×7): 5000 [IU] via SUBCUTANEOUS
  Filled 2015-11-06 (×7): qty 1

## 2015-11-06 MED ORDER — ONDANSETRON HCL 4 MG/2ML IJ SOLN: 4.0000 mg | Freq: Four times a day (QID) | INTRAMUSCULAR | Status: DC | PRN

## 2015-11-06 MED ORDER — DOXYCYCLINE HYCLATE 100 MG PO TABS
100.0000 mg | ORAL_TABLET | Freq: Two times a day (BID) | ORAL | Status: DC
  Administered 2015-11-06 – 2015-11-08 (×5): 100 mg via ORAL
  Filled 2015-11-06 (×5): qty 1

## 2015-11-06 NOTE — Consult Note (Signed)
Cardiology Consultation Note    Patient ID: Travis [name], MRN: [id number], DOB/AGE: 1941/06/18 75 y.o. Admit date: 11/05/2015   Date of Consult: 11/06/2015 Primary Physician: [name], MD Primary Cardiologist: Dr.   Laurel Dimmer Complaint: weakness Reason for Consultation: pericardial effusion Requesting MD: Dr. Candiss Norse  HPI: Mr. 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 He was admitted for weakness and fever.  Per review of chart he was previously healthy until 2016 when he had numerous admissions  - initially for HCAP twice in 12/2014 with eventual diagnosis of pulm fibrosis and Sjogren's. The patient was found to be ANA neg, but SSA and SSB pos, Rf pos. - In 01/2015 he presented with severe fatigue and was found to have RLE DVT and new renal failure. VQ scan was intermediate probability. He underwent a renal biopsy which showed thrombotic microangiopathy. Following the biopsy, patient has increased back pain and CT scan showed a large perinephric hemorrhage. His anticoagulation was discontinued and IR was consulted and patient underwent a IVC filter placement. After 7 days of plasmapheresis and prednisone taper, his renal function did not recover thus dialysis was started.  - He was readmitted in 04/18/2015 with acute SOB and hypoxia. Dr. Meda Coffee saw him at that time for elevated troponin with EKG changes. Echo 04/20/15 showed mild LVH, EF 35-40%, no RWMA, + mild MR, trivial pericardial effusion. It appears this was managed medically at that time, considering cath once he improved clinically - but was also seen by  palliative care that admission to review goals of care.  - Seen in the ER 04/28/15 for syncope in setting of BP 50's at HD and meds were adjusted.  - Readmitted 05/13/15 for pulm edema with adjustment of his dry weight - Readmitted 07/2015 with hematochezia and proctitis, abx added for ? Bronchitis versus PNA. GOC revisited and patient became DNR.  He presented to [facility]/05/2015 with chest pain, malaise, fever, nausea, and vomiting. He was initially placed on empiric abx for concern for sepsis with lactic acidosis which have since been de-escalated to oral form. Hgb 11.6->10.3 (10-11 baseline), albumin 2.0, troponin 0.01-0.05-0.03. CRP 14.7. TSH 5.7. Influenza panel neg. CTA showed no PE, but did show small bilateral pleural effusions, relatively large pericardial effusion, patchy opacities in left lung appear to reflect chronic scarring, diffuse coronary artery calcification, scattered calcified mediastinal nodes reflect remote granulomatous disease. HR is in the 90s-100s (near baseline for patient). Pulse ox 100% RA and BP stable.  The patient is not a great historian. He says the reason he came into the hospital is because he was weak and cannot tolerate 4 full hours of dialysis. He thinks he may have had some chest pain during HD and 2 weeks ago while eating rice. Admit notes indicate he had had an "aching" across his chest. He otherwise denies any chest pain, including at present time. No pleuritic chest discomfort. He denies any SOB, orthopnea, LEE or recurrent syncope. He has continued to have unintentional weight loss - in 12/2014, weighed 220 lb - now 136lb.  Past Medical History  Diagnosis Date  . Arthritis   . Pneumonia   .  GERD (gastroesophageal reflux disease)   . Sjogren's disease ([place]) 01/28/2015  . Pulmonary fibrosis ([place])   . ESRD on hemodialysis [facility]) June 2016    a. 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 Received plasma exchange and steroids  but didn't respond and ended up starting hemodialysis June 2016.   Marland Kitchen Essential hypertension   . Chronic combined systolic and diastolic CHF (congestive heart failure) ([place])     a. Etiology of low EF not defined.  Marland Kitchen DVT (deep venous thrombosis) ([place])     a. 01/2015: RLE DVT. VQ scan intermediate probability. Underwent renal bx complicated by perinephric hematoma; anticoagulation stopped and IVC filter placed.  . Syncope     a. 04/2015 in HD in setting of BP 50s.  . 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 Anemia   . Protein calorie malnutrition ([place])   . Chronic respiratory failure ([place])   . Perinephric hematoma 01/2015      Surgical History:  Past Surgical History  Procedure Laterality Date  . Hemorroidectomy  1999  . Surgical procedure to remove a mole as a child Right Eye area    At around 4 years old  . Insertion of dialysis catheter N/A 02/17/2015    Procedure: INSERTION OF DIALYSIS CATHETER RIGHT INTERNAL JUGULAR VEIN;  Surgeon: Elam Dutch, MD;  Location: [facility];  Service: Vascular;  Laterality: N/A;  . Av fistula placement Right 02/17/2015    Procedure: INSERTION OF RIGHT ARM  ARTERIOVENOUS (AV) GORE-TEX GRAFT ;  Surgeon: Elam Dutch, MD;  Location: [place];  Service: Vascular;  Laterality: Right;  . Flexible sigmoidoscopy N/A 08/04/2015    Procedure: FLEXIBLE SIGMOIDOSCOPY;  Surgeon: Wilford Corner, MD;  Location: [facility] ENDOSCOPY;  Service: Endoscopy;  Laterality: N/A;     Home Meds: Prior to Admission medications   Medication Sig Start Date End Date Taking? Authorizing Provider  aspirin EC 81 MG EC tablet Take 1 tablet (81 mg total) by mouth daily. 04/23/15  Yes Reyne Dumas, MD  carvedilol (COREG) 3.125 MG tablet Take 1 tablet by mouth 2 (two) times daily. 07/24/15  Yes Historical Provider, MD  [facility] TUSSIN DM 100-10 MG/5ML liquid Take 5 mLs by mouth every other day as needed for cough.  08/04/15  Yes Historical Provider, MD  cyanocobalamin 500 MCG tablet Take 1 tablet (500 mcg total) by mouth  daily. 12/26/14  Yes Donne Hazel, MD  hydrOXYzine (VISTARIL) 25 MG capsule Take 25 mg by mouth at bedtime as needed (sleep. May repeat 2 hours later if necessary).   Yes Historical Provider, MD  multivitamin (RENA-VIT) TABS tablet Take 1 tablet by mouth daily.   Yes Historical Provider, MD  omeprazole (PRILOSEC) 20 MG capsule TAKE 1 CAPSULE (20 MG TOTAL) BY MOUTH DAILY. 07/31/15  Yes Historical Provider, MD  sevelamer carbonate (RENVELA) 800 MG tablet Take 2 tablets (1,600 mg total) by mouth 3 (three) times daily with meals. 05/17/15  Yes Cherene Altes, MD  atorvastatin (LIPITOR) 40 MG tablet Take 1 tablet (40 mg total) by mouth daily at 6 PM. Patient not taking: Reported on 11/05/2015 04/23/15   Reyne Dumas, MD    Inpatient Medications:  . aspirin EC  81 mg Oral Daily  . carvedilol  3.125 mg Oral BID  . colchicine  0.6 mg Oral Daily  . [START ON 11/12/2015] darbepoetin (ARANESP) injection - DIALYSIS  100 mcg Intravenous Q Wed-HD  . doxycycline  100 mg Oral Q12H  . [START ON  11/07/2015] ferric gluconate (FERRLECIT/NULECIT) IV  125 mg Intravenous Q M,W,F-HD  . heparin subcutaneous  5,000 Units Subcutaneous 3 times per day  . pantoprazole  40 mg Oral Daily  . sevelamer carbonate  1,600 mg Oral TID WC  . sodium chloride flush  3 mL Intravenous Q12H      Allergies: No Known Allergies  Social History   Social History  . Marital Status: Married    Spouse Name: N/A  . Number of Children: N/A  . Years of Education: N/A   Occupational History  . Not on file.   Social History Main Topics  . Smoking status: Never Smoker   . Smokeless tobacco: Never Used  . Alcohol Use: No  . Drug Use: No  . Sexual Activity: Not on file   Other Topics Concern  . Not on file   Social History Narrative     Family History  Problem Relation Age of Onset  . Hypertension Mother   . Healthy Father   . Hypertension Sister   . Hypertension Brother      Review of Systems: +Fever. All other systems  reviewed and are otherwise negative except as noted above.  Labs:  Recent Labs  11/06/15 0831 11/06/15 1350  TROPONINI 0.05* <0.03   Lab Results  Component Value Date   WBC 10.5 11/06/2015   HGB 10.3* 11/06/2015   HCT 33.0* 11/06/2015   MCV 95.7 11/06/2015   PLT 184 11/06/2015    Recent Labs Lab 11/06/15 0831  NA 135  K 4.2  CL 97*  CO2 25  BUN 21*  CREATININE 4.45*  CALCIUM 8.9  PROT 7.1  BILITOT 0.5  ALKPHOS 86  ALT 8*  AST 17  GLUCOSE 102*      Radiology/Studies:  Ct Angio Chest Pe W/cm &/or Wo Cm  11/06/2015  CLINICAL DATA:  Acute onset of generalized weakness and malaise. Vomiting. Achiness across the chest. Chronic shortness of breath. Initial encounter. EXAM: CT ANGIOGRAPHY CHEST WITH CONTRAST TECHNIQUE: Multidetector CT imaging of the chest was performed using the standard protocol during bolus administration of intravenous contrast. Multiplanar CT image reconstructions and MIPs were obtained to evaluate the vascular anatomy. CONTRAST:  152mL OMNIPAQUE IOHEXOL 350 MG/ML SOLN COMPARISON:  Chest radiograph performed 11/05/2015, and CT of the chest performed 05/16/2015 FINDINGS: There is no evidence of pulmonary embolus. Small bilateral pleural effusions are noted. Underlying interstitial prominence is seen, raising concern for mild pulmonary edema. A few peripheral patchy opacities within the left lung appear to reflect chronic scarring. There is no evidence of pneumothorax. No masses are identified; no abnormal focal contrast enhancement is seen. There is relatively large pericardial effusion, significantly increased in size from the prior study. Would correlate for any evidence of pericarditis or cardiac tamponade. Scattered calcified mediastinal nodes are noted, without significant mediastinal lymphadenopathy, though evaluation is suboptimal due to the surrounding pericardial effusion. Diffuse coronary artery calcifications are seen. The great vessels are grossly  unremarkable in appearance. No axillary lymphadenopathy is seen. The thyroid gland is unremarkable in appearance. The visualized portions of the liver and spleen are unremarkable. No acute osseous abnormalities are seen. Mild degenerative change is noted along the lower cervical spine. Review of the MIP images confirms the above findings. IMPRESSION: 1. No evidence of pulmonary embolus. 2. Small bilateral pleural effusions noted. Underlying interstitial prominence raises concern for mild pulmonary edema. 3. Relatively large pericardial effusion, significantly increased in size from the prior study. Would correlate for any evidence of pericarditis or  cardiac tamponade. 4. Few peripheral patchy opacities within the left lung appear to reflect chronic scarring. 5. Diffuse coronary artery calcifications seen. 6. Scattered calcified mediastinal nodes reflect remote granulomatous disease. Electronically Signed   By: Garald Balding M.D.   On: 11/06/2015 02:01   Dg Chest Portable 1 View  11/05/2015  CLINICAL DATA:  Chest pain for a day. EXAM: PORTABLE CHEST 1 VIEW COMPARISON:  08/19/2015 FINDINGS: 2136 hours. The lungs are clear wiithout focal pneumonia, edema, pneumothorax or pleural effusion. Interstitial markings are diffusely coarsened with chronic features. Hyperexpansion suggests emphysema. The cardio pericardial silhouette is enlarged. The visualized bony structures of the thorax are intact. Telemetry leads overlie the chest. IMPRESSION: Cardiomegaly with emphysema.  No acute cardiopulmonary findings. Electronically Signed   By: Misty Stanley M.D.   On: 11/05/2015 21:43    Wt Readings from Last 3 Encounters:  11/05/15 136 lb (61.689 kg)  09/05/15 138 lb (62.596 kg)  08/28/15 139 lb (63.05 kg)   EKG: sinus tach 110bpm, left axis deviation, TW flattening I, avL  Physical Exam: Blood pressure 134/70, pulse 91, temperature 98 F (36.7 C), temperature source Oral, resp. rate 18, height 5\' 10"  (1.778 m),  weight 136 lb (61.689 kg), SpO2 100 %. Body mass index is 19.51 kg/(m^2). General: Chronically ill appearing AAM in no acute distress. Lying essentially flat in bed Head: Normocephalic, atraumatic, sclera non-icteric, no xanthomas, nares are without discharge.  Neck: JVD not elevated. Lungs: Clear bilaterally to auscultation without wheezes, rales, or rhonchi. Breathing is unlabored. Heart: RRR with S1 S2. No murmurs, rubs, or gallops appreciated. Abdomen: Soft, non-tender, non-distended with normoactive bowel sounds. No hepatomegaly. No rebound/guarding. No obvious abdominal masses. Msk:  Strength and tone appear normal for age. Extremities: No clubbing or cyanosis. No edema.  Distal pedal pulses are 2+ and equal bilaterally. Neuro: Alert and oriented X 3. No facial asymmetry. No focal deficit. Moves all extremities spontaneously. Psych:  Responds to questions appropriately with a somewhat flat affect.   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 He was admitted for weakness, fever, and possible chest pain.  1. Chest pain: only 1/3 minimally elevated troponins, nonspecific in setting of renal failure. With ongoing weight loss, do not suspect he is a candidate for invasive ischemic workup. Would continue to manage medically. Continue ASA, BB. With ongoing issues with weakness I would err on holding off statin so as not to complicate the picture.  2. Pericardial effusion: will review CT with MD. 2D echo is pending. He  does not presently have JVD, hypotension or muffled heart sounds. VSS, less suggestive of tamponade physiology.   3. Chronic combined CHF: volume now managed by HD.   Signed, Charlie Pitter PA-C 11/06/2015, 4:02 PM Pager: [phone]  The patient was seen, examined and discussed with Melina Copa, PA-C and I agree with the above.   This is a very unfortunate 75 year old male with history of chronic combined systolic and diastolic CHF, with underlying autoimmune problems including Sjogren's disease, pulmonary fibrosis, also  thrombotic microangiopathy (MWF) resulting in  end-stage renal disease on hemodialysis since May 2016, with anemia of chronic diseases and severe protein calorie malnutrition, with weight loss of almost 100 pounds in the last year.  We have been consulted for finding of pericardial effusion on cardiac CT. I have reviewed CT personally it appears like moderate circumferential effusion with the maximum diameter 15 mm, I doubt that these degree of effusion would cause any hemodynamic compromise, and patient denies any pleuritic type of chest pain. We will await echocardiogram for confirmation of the size of the pericardial effusion and 40 evaluation of hemodynamic effect of these effusion on the heart.  These effusion is most probably result of underlying occult immune disease and end-stage renal disease. Since the patient is asymptomatic, if echocardiogram is reassuring we will just suggest conservative management and follow  with serial echoes in the future.  Dorothy Spark 11/06/2015

## 2015-11-06 NOTE — Consult Note (Signed)
[facility] Renal Consultation Note  Indication for Consultation:  Management of ESRD/hemodialysis; anemia, hypertension/volume and secondary hyperparathyroidism  HPI: Travis [name] is a 75 y.o. male with ho Pulmonary fibrosis /Sjogren's dz/ chronic HD MWF([facility]) last hd yesterday , went home + Felt his usual  fatigued self , with Nausea,  But had  Vomiting and felt worse  /wife called ems secondary to pt "looking sick". In er temp 101.1. (was afeb at HD 11/05/15) , sone chest discomfort, non radiating ,occas sob none now ,reported rhinorrhea , no cough ,no or diarrhea ."I cannot  stay on that HD for 4 hrs ,need 3hr dialysis only." States feels to weak on 4hr, noted EDW was increase to 62 kg last week .Wt in ER 62 kg ?bed wt. Last 2 hd txs post  wts 62.6 and 63.5 with Bp pre okay 137/80.     In ER  K 4.1/ hgb 11.6./ wbc 12.1/ troponin.01/influenza PCR   Neg. CT chest showed pericardial effusion worse from prior. BUT pt . is comfortable an is hemodynamically stable.  CXR showed Emphysema no  pulmonary edema . IN ER sleeping with covers over head when I saw him and he states feel better.     Past Medical History  Diagnosis Date  . Medical history non-contributory   . Arthritis   . Pneumonia   . GERD (gastroesophageal reflux disease)   . Sjogren's disease ([place]) 01/28/2015  . Pulmonary fibrosis ([place])   . ESRD on hemodialysis [facility]) June 2016    had severe renal failure May-June 2016 with TMA on biopsy, felt to be idiopathic. Received plasma exchange and steroids but didn't respond and ended up starting hemodialysis June 2016.   Marland Kitchen Acute on chronic diastolic CHF (congestive heart failure) ([place]) 04/18/2015  . Accelerated hypertension 04/19/2015    Past Surgical History  Procedure Laterality Date  . Hemorroidectomy  1999  . Surgical procedure to remove a mole as a child Right Eye area    At around 30 years old  . Insertion of dialysis catheter N/A 02/17/2015    Procedure: INSERTION OF  DIALYSIS CATHETER RIGHT INTERNAL JUGULAR VEIN;  Surgeon: Elam Dutch, MD;  Location: [place];  Service: Vascular;  Laterality: N/A;  . Av fistula placement Right 02/17/2015    Procedure: INSERTION OF RIGHT ARM  ARTERIOVENOUS (AV) GORE-TEX GRAFT ;  Surgeon: Elam Dutch, MD;  Location: [place];  Service: Vascular;  Laterality: Right;  . Flexible sigmoidoscopy N/A 08/04/2015    Procedure: FLEXIBLE SIGMOIDOSCOPY;  Surgeon: Wilford Corner, MD;  Location: [facility] ENDOSCOPY;  Service: Endoscopy;  Laterality: N/A;      Family History  Problem Relation Age of Onset  . Hypertension Mother   . Healthy Father   . Hypertension Sister   . Hypertension Brother       reports that he has never smoked. He has never used smokeless tobacco. He reports that he does not drink alcohol or use illicit drugs.  No Known Allergies  Prior to Admission medications   Medication Sig Start Date End Date Taking? Authorizing Provider  aspirin EC 81 MG EC tablet Take 1 tablet (81 mg total) by mouth daily. 04/23/15  Yes Reyne Dumas, MD  carvedilol (COREG) 3.125 MG tablet Take 1 tablet by mouth 2 (two) times daily. 07/24/15  Yes Historical Provider, MD  [facility] TUSSIN DM 100-10 MG/5ML liquid Take 5 mLs by mouth every other day as needed for cough.  08/04/15  Yes Historical Provider, MD  cyanocobalamin  500 MCG tablet Take 1 tablet (500 mcg total) by mouth daily. 12/26/14  Yes Jerald Kief, MD  hydrOXYzine (VISTARIL) 25 MG capsule Take 25 mg by mouth at bedtime as needed (sleep. May repeat 2 hours later if necessary).   Yes Historical Provider, MD  multivitamin (RENA-VIT) TABS tablet Take 1 tablet by mouth daily.   Yes Historical Provider, MD  omeprazole (PRILOSEC) 20 MG capsule TAKE 1 CAPSULE (20 MG TOTAL) BY MOUTH DAILY. 07/31/15  Yes Historical Provider, MD  sevelamer carbonate (RENVELA) 800 MG tablet Take 2 tablets (1,600 mg total) by mouth 3 (three) times daily with meals. 05/17/15  Yes Lonia Blood, MD  atorvastatin  (LIPITOR) 40 MG tablet Take 1 tablet (40 mg total) by mouth daily at 6 PM. Patient not taking: Reported on 11/05/2015 04/23/15   Richarda Overlie, MD     Anti-infectives    Start     Dose/Rate Route Frequency Ordered Stop   11/07/15 1200  vancomycin (VANCOCIN) IVPB 750 mg/150 ml premix  Status:  Discontinued     750 mg 150 mL/hr over 60 Minutes Intravenous Every M-W-F (Hemodialysis) 11/05/15 2212 11/06/15 1152   11/06/15 1200  doxycycline (VIBRA-TABS) tablet 100 mg     100 mg Oral Every 12 hours 11/06/15 1152     11/06/15 0600  piperacillin-tazobactam (ZOSYN) IVPB 2.25 g  Status:  Discontinued     2.25 g 100 mL/hr over 30 Minutes Intravenous 3 times per day 11/05/15 2212 11/06/15 1152   11/05/15 2215  vancomycin (VANCOCIN) 500 mg in sodium chloride 0.9 % 100 mL IVPB     500 mg 100 mL/hr over 60 Minutes Intravenous  Once 11/05/15 2212 11/05/15 2358   11/05/15 2130  piperacillin-tazobactam (ZOSYN) IVPB 3.375 g     3.375 g 100 mL/hr over 30 Minutes Intravenous  Once 11/05/15 2129 11/05/15 2300   11/05/15 2130  vancomycin (VANCOCIN) IVPB 1000 mg/200 mL premix     1,000 mg 200 mL/hr over 60 Minutes Intravenous  Once 11/05/15 2129 11/05/15 2330      Results for orders placed or performed during the hospital encounter of 11/05/15 (from the past 48 hour(s))  CBC     Status: Abnormal   Collection Time: 11/05/15  9:30 PM  Result Value Ref Range   WBC 12.1 (H) 4.0 - 10.5 K/uL   RBC 4.09 (L) 4.22 - 5.81 MIL/uL   Hemoglobin 11.6 (L) 13.0 - 17.0 g/dL   HCT 71.1 (L) 65.4 - 61.2 %   MCV 93.6 78.0 - 100.0 fL   MCH 28.4 26.0 - 34.0 pg   MCHC 30.3 30.0 - 36.0 g/dL   RDW 43.2 (H) 75.5 - 62.3 %   Platelets 252 150 - 400 K/uL  Comprehensive metabolic panel     Status: Abnormal   Collection Time: 11/05/15  9:30 PM  Result Value Ref Range   Sodium 137 135 - 145 mmol/L   Potassium 4.1 3.5 - 5.1 mmol/L   Chloride 92 (L) 101 - 111 mmol/L   CO2 30 22 - 32 mmol/L   Glucose, Bld 130 (H) 65 - 99 mg/dL    BUN 15 6 - 20 mg/dL   Creatinine, Ser 9.21 (H) 0.61 - 1.24 mg/dL   Calcium 9.1 8.9 - 51.5 mg/dL   Total Protein 8.1 6.5 - 8.1 g/dL   Albumin 2.3 (L) 3.5 - 5.0 g/dL   AST 20 15 - 41 U/L   ALT 10 (L) 17 - 63 U/L   Alkaline  Phosphatase 111 38 - 126 U/L   Total Bilirubin 0.4 0.3 - 1.2 mg/dL   GFR calc non Af Amer 14 (L) >60 mL/min   GFR calc Af Amer 16 (L) >60 mL/min    Comment: (NOTE) The eGFR has been calculated using the CKD EPI equation. This calculation has not been validated in all clinical situations. eGFR's persistently <60 mL/min signify possible Chronic Kidney Disease.    Anion gap 15 5 - 15  I-stat troponin, ED (not at [facility])     Status: None   Collection Time: 11/05/15  9:45 PM  Result Value Ref Range   Troponin i, poc 0.01 0.00 - 0.08 ng/mL   Comment 3            Comment: Due to the release kinetics of cTnI, a negative result within the first hours of the onset of symptoms does not rule out myocardial infarction with certainty. If myocardial infarction is still suspected, repeat the test at appropriate intervals.   I-Stat CG4 Lactic Acid, ED (Not at [facility])     Status: Abnormal   Collection Time: 11/05/15  9:48 PM  Result Value Ref Range   Lactic Acid, Venous 2.76 (HH) 0.5 - 2.0 mmol/L   Comment NOTIFIED PHYSICIAN   Influenza panel by PCR (type A & B, H1N1)     Status: None   Collection Time: 11/06/15 12:28 AM  Result Value Ref Range   Influenza A By PCR NEGATIVE NEGATIVE   Influenza B By PCR NEGATIVE NEGATIVE   H1N1 flu by pcr NOT DETECTED NOT DETECTED    Comment:        The Xpert Flu assay (FDA approved for nasal aspirates or washes and nasopharyngeal swab specimens), is intended as an aid in the diagnosis of influenza and should not be used as a sole basis for treatment.   I-Stat CG4 Lactic Acid, ED (Not at [facility])     Status: None   Collection Time: 11/06/15 12:53 AM  Result Value Ref Range   Lactic Acid, Venous 1.90 0.5 - 2.0 mmol/L  Magnesium      Status: None   Collection Time: 11/06/15  8:31 AM  Result Value Ref Range   Magnesium 1.7 1.7 - 2.4 mg/dL  Phosphorus     Status: None   Collection Time: 11/06/15  8:31 AM  Result Value Ref Range   Phosphorus 4.4 2.5 - 4.6 mg/dL  TSH     Status: Abnormal   Collection Time: 11/06/15  8:31 AM  Result Value Ref Range   TSH 5.798 (H) 0.350 - 4.500 uIU/mL  Comprehensive metabolic panel     Status: Abnormal   Collection Time: 11/06/15  8:31 AM  Result Value Ref Range   Sodium 135 135 - 145 mmol/L   Potassium 4.2 3.5 - 5.1 mmol/L   Chloride 97 (L) 101 - 111 mmol/L   CO2 25 22 - 32 mmol/L   Glucose, Bld 102 (H) 65 - 99 mg/dL   BUN 21 (H) 6 - 20 mg/dL   Creatinine, Ser 4.45 (H) 0.61 - 1.24 mg/dL   Calcium 8.9 8.9 - 10.3 mg/dL   Total Protein 7.1 6.5 - 8.1 g/dL   Albumin 2.0 (L) 3.5 - 5.0 g/dL   AST 17 15 - 41 U/L   ALT 8 (L) 17 - 63 U/L   Alkaline Phosphatase 86 38 - 126 U/L   Total Bilirubin 0.5 0.3 - 1.2 mg/dL   GFR calc non Af Amer 12 (L) >  60 mL/min   GFR calc Af Amer 14 (L) >60 mL/min    Comment: (NOTE) The eGFR has been calculated using the CKD EPI equation. This calculation has not been validated in all clinical situations. eGFR's persistently <60 mL/min signify possible Chronic Kidney Disease.    Anion gap 13 5 - 15  CBC     Status: Abnormal   Collection Time: 11/06/15  8:31 AM  Result Value Ref Range   WBC 10.5 4.0 - 10.5 K/uL   RBC 3.45 (L) 4.22 - 5.81 MIL/uL   Hemoglobin 10.3 (L) 13.0 - 17.0 g/dL   HCT 33.0 (L) 39.0 - 52.0 %   MCV 95.7 78.0 - 100.0 fL   MCH 29.9 26.0 - 34.0 pg   MCHC 31.2 30.0 - 36.0 g/dL   RDW 16.4 (H) 11.5 - 15.5 %   Platelets 184 150 - 400 K/uL  Troponin I     Status: Abnormal   Collection Time: 11/06/15  8:31 AM  Result Value Ref Range   Troponin I 0.05 (H) <0.031 ng/mL    Comment:        PERSISTENTLY INCREASED TROPONIN VALUES IN THE RANGE OF 0.04-0.49 ng/mL CAN BE SEEN IN:       -UNSTABLE ANGINA       -CONGESTIVE HEART FAILURE        -MYOCARDITIS       -CHEST TRAUMA       -ARRYHTHMIAS       -LATE PRESENTING MYOCARDIAL INFARCTION       -COPD   CLINICAL FOLLOW-UP RECOMMENDED.     ROS: see hpi  Physical Exam: Filed Vitals:   11/06/15 1030 11/06/15 1040  BP: 130/80 130/80  Pulse: 98 97  Temp:    Resp: 18      General: thin chronically ill elderly AAM, nad, OX3 HEENT: [place] MMdry, EOMI Neck: no jvd Heart: RRR with ?? Soft rub ,and 1/6 sem , no gallop Lungs:  CTA nonlaored brea5thing  Abdomen:  bs pos.  Soft , NT, ND Extremities: bilat  1+ pedal edema Skin: no overt rash/ darker hue skin trunk compared to face/ no pedal ulcers Neuro: alert ox3/ moves all extrem Dialysis Access: pos bruit RFA AVGG  Dialysis Orders: Center:[facility]  on MWF . EDW 62kg HD Bath 2k,2ca  Time 4hr Heparin 6000. Access RFA AVGG      Mircera 100 q2wk ( last on 10/29/15)     Venofer100 mg load last dose 11/17/15  Other op labs HGB 10.8  2/15  Ca 10. Phos 3.8 pth 94  Assessment/Plan 1. Febrile Illness= wu per admit  2. Pericardial effusion - On ct scan / 2d echo pend/ Card consult pend.  3. ESRD -  On MWF schedule . Next hd tomor  4. HO pulmonary Fibrosis/ Sjogren's dz/Emphysema -  5. Hypertension/volume  -  bp 145/90 /  On low dose Coreg 3.18m bid as op /cxr no pulm  Edema/ recent EDW raised as op [facility] . At current edw by ER wt/ neds stand wt pre post hd/ 6. Anemia  - hgb 10.3 ESA  q weekly hd  And fe load as above 7. Metabolic bone disease -  No vit d on hd / phos 4.4 on Renvela  binder   DErnest Haber PA-C [facility]per [phone]/23/2017, 11:15 AM

## 2015-11-06 NOTE — Evaluation (Signed)
Physical Therapy Evaluation Patient Details Name: Kazuto Marinez MRN: [id number] DOB: [birth year] Today's Date: 11/06/2015   History of Present Illness  Pt adm with nausea, vomiting and fever. PMH - ESRD on HD, Arthritis; Pneumonia,Sjogren's disease, Pulmonary fibrosis, CHF  Clinical Impression  Pt admitted with above diagnosis and presents to PT with functional limitations due to deficits listed below (See PT problem list). Pt needs skilled PT to maximize independence and safety to allow discharge to home with family.     Follow Up Recommendations Home health PT;Supervision for mobility/OOB    Equipment Recommendations  None recommended by PT    Recommendations for Other Services       Precautions / Restrictions Precautions Precautions: Fall Restrictions Weight Bearing Restrictions: No      Mobility  Bed Mobility Overal bed mobility: Needs Assistance Bed Mobility: Supine to Sit;Sit to Supine     Supine to sit: Mod assist Sit to supine: Mod assist   General bed mobility comments: Assist to elevate trunk and scoot to EOB  Transfers Overall transfer level: Needs assistance Equipment used: Rolling walker (2 wheeled) Transfers: Sit to/from Stand Sit to Stand: +2 physical assistance;Mod assist         General transfer comment: Assist to bring hips up  Ambulation/Gait Ambulation/Gait assistance: Min assist;+2 safety/equipment Ambulation Distance (Feet): 100 Feet Assistive device: Rolling walker (2 wheeled) Gait Pattern/deviations: Step-through pattern;Decreased step length - right;Decreased step length - left;Trunk flexed Gait velocity: decr Gait velocity interpretation: Below normal speed for age/gender General Gait Details: Pt with slightly flexed knees and hips throughout.   Stairs            Wheelchair Mobility    Modified Rankin (Stroke Patients Only)       Balance Overall balance assessment: Needs assistance Sitting-balance support: No upper  extremity supported;Feet supported Sitting balance-Leahy Scale: Fair     Standing balance support: Bilateral upper extremity supported Standing balance-Leahy Scale: Poor Standing balance comment: walker and min A  for static standing                             Pertinent Vitals/Pain Pain Assessment: No/denies pain    Home Living Family/patient expects to be discharged to:: Private residence Living Arrangements: Spouse/significant other;Children Available Help at Discharge: Family;Available 24 hours/day (son now lives with ) Type of Home: House Home Access: Stairs to enter Entrance Stairs-Rails: Right;Left;Can reach both Entrance Stairs-Number of Steps: 5 Home Layout: Two level;Laundry or work area in basement;Able to live on main level with bedroom/bathroom Home Equipment: Environmental consultant - 2 wheels;Bedside commode;Wheelchair - manual      Prior Function Level of Independence: Needs assistance   Gait / Transfers Assistance Needed: Amb with assist and walker. Uses w/c for community            Hand Dominance   Dominant Hand: Left    Extremity/Trunk Assessment   Upper Extremity Assessment: Generalized weakness           Lower Extremity Assessment: Generalized weakness         Communication   Communication: No difficulties  Cognition Arousal/Alertness: Awake/alert Behavior During Therapy: WFL for tasks assessed/performed Overall Cognitive Status: No family/caregiver present to determine baseline cognitive functioning       Memory: Decreased short-term memory              General Comments      Exercises        Assessment/Plan  PT Assessment Patient needs continued PT services  PT Diagnosis Difficulty walking;Generalized weakness   PT Problem List Decreased strength;Decreased activity tolerance;Decreased balance;Decreased mobility;Decreased knowledge of use of DME  PT Treatment Interventions DME instruction;Gait training;Functional  mobility training;Therapeutic activities;Therapeutic exercise;Balance training;Patient/family education   PT Goals (Current goals can be found in the Care Plan section) Acute Rehab PT Goals Patient Stated Goal: Return home PT Goal Formulation: With patient Time For Goal Achievement: 11/13/15 Potential to Achieve Goals: Good    Frequency Min 3X/week   Barriers to discharge        Co-evaluation               End of Session Equipment Utilized During Treatment: Gait belt Activity Tolerance: Patient tolerated treatment well Patient left: in bed;with call bell/phone within reach;with bed alarm set Nurse Communication: Mobility status    Functional Assessment Tool Used: clinical observation Functional Limitation: Mobility: Walking and moving around Mobility: Walking and Moving Around Current Status [id number]): At least 40 percent but less than 60 percent impaired, limited or restricted Mobility: Walking and Moving Around Goal Status [phone]): At least 1 percent but less than 20 percent impaired, limited or restricted    Time: 1422-1449 PT Time Calculation (min) (ACUTE ONLY): 27 min   Charges:   PT Evaluation $PT Eval Moderate Complexity: 1 Procedure PT Treatments $Gait Training: 8-22 mins   PT G Codes:   PT G-Codes **NOT FOR INPATIENT CLASS** Functional Assessment Tool Used: clinical observation Functional Limitation: Mobility: Walking and moving around Mobility: Walking and Moving Around Current Status [id number]): At least 40 percent but less than 60 percent impaired, limited or restricted Mobility: Walking and Moving Around Goal Status [phone]): At least 1 percent but less than 20 percent impaired, limited or restricted    [facility] 11/06/2015, 3:39 PM [facility] PT [phone]

## 2015-11-06 NOTE — ED Notes (Signed)
Called pharmacy for dose of Zosyn. Medication to be tubed as soon as ready.

## 2015-11-06 NOTE — Progress Notes (Addendum)
Patient Demographics:    Travis [name], is a 75 y.o. male, DOB - 1941-06-22, XYI:[id number]  Admit date - 11/05/2015   Admitting Physician Toy Baker, MD  Outpatient Primary MD for the patient is [name], MD  LOS -    Chief Complaint  Patient presents with  . Chest Pain  . Weakness        Subjective:    Travis [name] today has, No headache, mild substernal chest pain, No abdominal pain - No Nausea, No new weakness tingling or numbness, No Cough - SOB.     Assessment  & Plan :     1. Acute on chronic substernal chest discomfort. Noted to have moderate to large pericardial effusion on CT scan, has had some effusion for a while, clinically does not appear to have tamponade, echo ordered, cardiology requested to evaluate, check ESR-CRP, place on low-dose aspirin and Colchicine. Monitor.  2. ESRD. Dialysis due to tomorrow, on Tuesday-Thursday-Saturday schedule. Renal consulted.  3. History of Sjogren's syndrome and pulmonary fibrosis with chronic respiratory failure. No acute issues. Supportive care at this time. The new home oxygen 2 L nasal cannula per minute.  4.? SIRs on admission - do not think he has ongoing bacterial infection. Follow cultures, influenza negative, de-escalate antibiotics to oral doxycycline.  5. Chronic systolic heart failure. He has 35-40%. Currently compensated, monitor. On Coreg, no ACE/ARB due to renal failure.   6. Severe protein calorie malnutrition. Nutritional supplements.     Code Status : DNR  Family Communication  : None  Disposition Plan  : remain inpatient  Consults  :  Renal, Cards  Procedures  :   TTE  DVT Prophylaxis  :   Heparin   Lab Results  Component Value Date   PLT 184 11/06/2015    Inpatient Medications  Scheduled  Meds: . aspirin EC  81 mg Oral Daily  . carvedilol  3.125 mg Oral BID  . pantoprazole  40 mg Oral Daily  . sevelamer carbonate  1,600 mg Oral TID WC  . sodium chloride flush  3 mL Intravenous Q12H  . sodium chloride flush  3 mL Intravenous Q12H  . [START ON 11/07/2015] vancomycin  750 mg Intravenous Q M,W,F-HD   Continuous Infusions: . sodium chloride    . piperacillin-tazobactam (ZOSYN)  IV Stopped (11/06/15 0725)   PRN Meds:.sodium chloride, acetaminophen **OR** acetaminophen, HYDROcodone-acetaminophen, hydrOXYzine, ondansetron **OR** ondansetron (ZOFRAN) IV, sodium chloride flush  Antibiotics  :     Anti-infectives    Start     Dose/Rate Route Frequency Ordered Stop   11/07/15 1200  vancomycin (VANCOCIN) IVPB 750 mg/150 ml premix     750 mg 150 mL/hr over 60 Minutes Intravenous Every M-W-F (Hemodialysis) 11/05/15 2212     11/06/15 0600  piperacillin-tazobactam (ZOSYN) IVPB 2.25 g     2.25 g 100 mL/hr over 30 Minutes Intravenous 3 times per day 11/05/15 2212     11/05/15 2215  vancomycin (VANCOCIN) 500 mg in sodium chloride 0.9 % 100 mL IVPB     500 mg 100 mL/hr over 60 Minutes Intravenous  Once 11/05/15 2212 11/05/15 2358   11/05/15 2130  piperacillin-tazobactam (ZOSYN) IVPB 3.375 g     3.375 g 100 mL/hr over 30 Minutes Intravenous  Once 11/05/15 2129 11/05/15 2300   11/05/15 2130  vancomycin (VANCOCIN) IVPB 1000 mg/200 mL premix     1,000 mg 200 mL/hr over 60 Minutes Intravenous  Once 11/05/15 2129 11/05/15 2330        Objective:   Filed Vitals:   11/06/15 0915 11/06/15 0945 11/06/15 1030 11/06/15 1040  BP: 122/73 134/77 130/80 130/80  Pulse: 99 100 98 97  Temp:      TempSrc:      Resp: 23 27 18    Height:      Weight:      SpO2: 99% 98% 100%     Wt Readings from Last 3 Encounters:  11/05/15 61.689 kg (136 lb)  09/05/15 62.596 kg (138 lb)  08/28/15 63.05 kg (139 lb)     Intake/Output Summary (Last 24 hours) at 11/06/15 1145 Last data filed at 11/05/15  2307  Gross per 24 hour  Intake   2400 ml  Output      0 ml  Net   2400 ml     Physical Exam  Awake Alert, Oriented X 3, No new F.N deficits, Normal affect [place].AT,PERRAL Supple Neck,No JVD, No cervical lymphadenopathy appriciated.  Symmetrical Chest wall movement, Good air movement bilaterally, CTAB RRR,No Gallops,Rubs or new Murmurs, No Parasternal Heave +ve B.Sounds, Abd Soft, No tenderness, No organomegaly appriciated, No rebound - guarding or rigidity. No Cyanosis, Clubbing or edema, No new Rash or bruise       Data Review:   Micro Results No results found for this or any previous visit (from the past 240 hour(s)).  Radiology Reports Ct Angio Chest Pe W/cm &/or Wo Cm  11/06/2015  CLINICAL DATA:  Acute onset of generalized weakness and malaise. Vomiting. Achiness across the chest. Chronic shortness of breath. Initial encounter. EXAM: CT ANGIOGRAPHY CHEST WITH CONTRAST TECHNIQUE: Multidetector CT imaging of the chest was performed using the standard protocol during bolus administration of intravenous contrast. Multiplanar CT image reconstructions and MIPs were obtained to evaluate the vascular anatomy. CONTRAST:  167m OMNIPAQUE IOHEXOL 350 MG/ML SOLN COMPARISON:  Chest radiograph performed 11/05/2015, and CT of the chest performed 05/16/2015 FINDINGS: There is no evidence of pulmonary embolus. Small bilateral pleural effusions are noted. Underlying interstitial prominence is seen, raising concern for mild pulmonary edema. A few peripheral patchy opacities within the left lung appear to reflect chronic scarring. There is no evidence of pneumothorax. No masses are identified; no abnormal focal contrast enhancement is seen. There is relatively large pericardial effusion, significantly increased in size from the prior study. Would correlate for any evidence of pericarditis or cardiac tamponade. Scattered calcified mediastinal nodes are noted, without significant mediastinal lymphadenopathy,  though evaluation is suboptimal due to the surrounding pericardial effusion. Diffuse coronary artery calcifications are seen. The great vessels are grossly unremarkable in appearance. No axillary lymphadenopathy is seen. The thyroid gland is unremarkable in appearance. The visualized portions of the liver and spleen are unremarkable. No acute osseous abnormalities are seen. Mild degenerative change is noted along the lower cervical spine. Review of the MIP images confirms the above findings. IMPRESSION: 1. No evidence of pulmonary embolus. 2. Small bilateral pleural effusions noted. Underlying interstitial prominence raises concern for mild pulmonary edema. 3. Relatively large pericardial effusion, significantly increased in size from the prior study. Would correlate for any evidence of pericarditis or cardiac tamponade. 4. Few peripheral patchy opacities within the left lung appear to reflect chronic scarring. 5. Diffuse coronary artery calcifications seen. 6. Scattered calcified mediastinal nodes reflect remote  granulomatous disease. Electronically Signed   By: Garald Balding M.D.   On: 11/06/2015 02:01   Dg Chest Portable 1 View  11/05/2015  CLINICAL DATA:  Chest pain for a day. EXAM: PORTABLE CHEST 1 VIEW COMPARISON:  08/19/2015 FINDINGS: 2136 hours. The lungs are clear wiithout focal pneumonia, edema, pneumothorax or pleural effusion. Interstitial markings are diffusely coarsened with chronic features. Hyperexpansion suggests emphysema. The cardio pericardial silhouette is enlarged. The visualized bony structures of the thorax are intact. Telemetry leads overlie the chest. IMPRESSION: Cardiomegaly with emphysema.  No acute cardiopulmonary findings. Electronically Signed   By: Misty Stanley M.D.   On: 11/05/2015 21:43     CBC  Recent Labs Lab 11/05/15 2130 11/06/15 0831  WBC 12.1* 10.5  HGB 11.6* 10.3*  HCT 38.3* 33.0*  PLT 252 184  MCV 93.6 95.7  MCH 28.4 29.9  MCHC 30.3 31.2  RDW 16.5* 16.4*     Chemistries   Recent Labs Lab 11/05/15 2130 11/06/15 0831  NA 137 135  K 4.1 4.2  CL 92* 97*  CO2 30 25  GLUCOSE 130* 102*  BUN 15 21*  CREATININE 3.89* 4.45*  CALCIUM 9.1 8.9  MG  --  1.7  AST 20 17  ALT 10* 8*  ALKPHOS 111 86  BILITOT 0.4 0.5   ------------------------------------------------------------------------------------------------------------------ No results for input(s): CHOL, HDL, LDLCALC, TRIG, CHOLHDL, LDLDIRECT in the last 72 hours.  Lab Results  Component Value Date   HGBA1C 4.9 01/30/2015   ------------------------------------------------------------------------------------------------------------------  Recent Labs  11/06/15 0831  TSH 5.798*   ------------------------------------------------------------------------------------------------------------------ No results for input(s): VITAMINB12, FOLATE, FERRITIN, TIBC, IRON, RETICCTPCT in the last 72 hours.  Coagulation profile No results for input(s): INR, PROTIME in the last 168 hours.  No results for input(s): DDIMER in the last 72 hours.  Cardiac Enzymes  Recent Labs Lab 11/06/15 0831  TROPONINI 0.05*   ------------------------------------------------------------------------------------------------------------------    Component Value Date/Time   BNP >4500.0* 05/14/2015 0130    Time Spent in minutes   35   Melody Savidge K M.D on 11/06/2015 at 11:45 AM  Between 7am to 7pm - Pager - [phone]  After 7pm go to [email or web address] - password [facility]  [facility] -  Office  [phone]

## 2015-11-06 NOTE — ED Notes (Signed)
Diet ordered for lunch patient eating at this time.

## 2015-11-06 NOTE — Progress Notes (Signed)
Travis [name] [id number] Admission Data: 11/06/2015 2:33 PM Attending Provider: Thurnell Lose, MD  [id number], MD Consults/ Treatment Team: Treatment Team:  Rounding Lbcardiology, MD Travis Jaffe, MD  Travis [name] is a 75 y.o. male patient admitted from ED awake, alert  & orientated  X 2,  DNR, VSS - Blood pressure 134/70, pulse 91, temperature 98 F (36.7 C), temperature source Oral, resp. rate 18, height 5\' 10"  (1.778 m), weight 61.689 kg (136 lb), SpO2 100 %., O2    2 L nasal cannular, no c/o shortness of breath, no c/o chest pain, no distress noted. Tele # 6 placed.   IV site WDL: Left A/C.  Allergies:  No Known Allergies   Past Medical History  Diagnosis Date  . Medical history non-contributory   . Arthritis   . Pneumonia   . GERD (gastroesophageal reflux disease)   . Sjogren's disease ([place]) 01/28/2015  . Pulmonary fibrosis ([place])   . 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 Received plasma exchange and steroids but didn't respond and ended up starting hemodialysis June 2016.   Marland Kitchen Acute on chronic diastolic CHF (congestive heart failure) ([place]) 04/18/2015  . Accelerated hypertension 04/19/2015  . SIRS (systemic inflammatory response syndrome) ([facility])   . Fever 11/06/2015      Pt orientation to unit, room and routine. Information packet given to patient/family and safety video watched.  Admission INP armband ID verified with patient/family, and in place. SR up x 2, fall risk assessment complete with Patient and family verbalizing understanding of risks associated with falls. Pt verbalizes an understanding of how to use the call bell and to call for help before getting out of bed.  Skin, clean-dry- intact without evidence of bruising, or skin tears.   No evidence of skin break down noted on exam.     Will cont to monitor and assist as needed.  Dayle Points, RN 11/06/2015 2:33 PM

## 2015-11-06 NOTE — ED Notes (Signed)
Pt states he is ready to go home.  Explained to pt why he is here and needs to stay.

## 2015-11-07 ENCOUNTER — Other Ambulatory Visit (HOSPITAL_COMMUNITY): Payer: Non-veteran care

## 2015-11-07 DIAGNOSIS — J8410 Pulmonary fibrosis, unspecified: Secondary | ICD-10-CM

## 2015-11-07 DIAGNOSIS — E43 Unspecified severe protein-calorie malnutrition: Secondary | ICD-10-CM | POA: Diagnosis not present

## 2015-11-07 DIAGNOSIS — N186 End stage renal disease: Secondary | ICD-10-CM | POA: Diagnosis not present

## 2015-11-07 DIAGNOSIS — I319 Disease of pericardium, unspecified: Secondary | ICD-10-CM | POA: Diagnosis not present

## 2015-11-07 LAB — CBC WITH DIFFERENTIAL/PLATELET
BASOS ABS: 0.1 10*3/uL (ref 0.0–0.1)
Basophils Relative: 1 %
EOS ABS: 0.4 10*3/uL (ref 0.0–0.7)
EOS PCT: 4 %
HCT: 31.6 % — ABNORMAL LOW (ref 39.0–52.0)
Hemoglobin: 9.7 g/dL — ABNORMAL LOW (ref 13.0–17.0)
LYMPHS ABS: 0.7 10*3/uL (ref 0.7–4.0)
LYMPHS PCT: 7 %
MCH: 28 pg (ref 26.0–34.0)
MCHC: 30.7 g/dL (ref 30.0–36.0)
MCV: 91.3 fL (ref 78.0–100.0)
Monocytes Absolute: 1.9 10*3/uL — ABNORMAL HIGH (ref 0.1–1.0)
Monocytes Relative: 20 %
NEUTROS ABS: 6.4 10*3/uL (ref 1.7–7.7)
NEUTROS PCT: 68 %
PLATELETS: 236 10*3/uL (ref 150–400)
RBC: 3.46 MIL/uL — AB (ref 4.22–5.81)
RDW: 16.4 % — ABNORMAL HIGH (ref 11.5–15.5)
WBC: 9.5 10*3/uL (ref 4.0–10.5)

## 2015-11-07 LAB — CBC
HEMATOCRIT: 32.5 % — AB (ref 39.0–52.0)
HEMOGLOBIN: 10.3 g/dL — AB (ref 13.0–17.0)
MCH: 29.3 pg (ref 26.0–34.0)
MCHC: 31.7 g/dL (ref 30.0–36.0)
MCV: 92.6 fL (ref 78.0–100.0)
Platelets: 235 10*3/uL (ref 150–400)
RBC: 3.51 MIL/uL — AB (ref 4.22–5.81)
RDW: 16.4 % — ABNORMAL HIGH (ref 11.5–15.5)
WBC: 9.1 10*3/uL (ref 4.0–10.5)

## 2015-11-07 LAB — RENAL FUNCTION PANEL
Albumin: 2 g/dL — ABNORMAL LOW (ref 3.5–5.0)
Anion gap: 14 (ref 5–15)
BUN: 33 mg/dL — ABNORMAL HIGH (ref 6–20)
CALCIUM: 8.9 mg/dL (ref 8.9–10.3)
CO2: 23 mmol/L (ref 22–32)
CREATININE: 6 mg/dL — AB (ref 0.61–1.24)
Chloride: 95 mmol/L — ABNORMAL LOW (ref 101–111)
GFR calc non Af Amer: 8 mL/min — ABNORMAL LOW (ref >60)
GFR, EST AFRICAN AMERICAN: 10 mL/min — AB (ref >60)
Glucose, Bld: 103 mg/dL — ABNORMAL HIGH (ref 65–99)
Phosphorus: 5.2 mg/dL — ABNORMAL HIGH (ref 2.5–4.6)
Potassium: 4.3 mmol/L (ref 3.5–5.1)
SODIUM: 132 mmol/L — AB (ref 135–145)

## 2015-11-07 LAB — HEMOGLOBIN A1C
HEMOGLOBIN A1C: 4.9 % (ref 4.8–5.6)
MEAN PLASMA GLUCOSE: 94 mg/dL

## 2015-11-07 NOTE — Progress Notes (Signed)
Patient Demographics:    Travis [name], is a 75 y.o. male, DOB - 1941-01-26, VUY:[id number]  Admit date - 11/05/2015   Admitting Physician Toy Baker, MD  Outpatient Primary MD for the patient is [name], MD  LOS -    Chief Complaint  Patient presents with  . Chest Pain  . Weakness        Subjective:    Travis [name] today has, No headache, mild substernal chest pain, No abdominal pain - No Nausea, No new weakness tingling or numbness, No Cough - SOB.     Assessment  & Plan :     1. Acute on chronic substernal chest discomfort. Noted to have moderate to large pericardial effusion on CT scan, has had some effusion for a while, clinically does not appear to have tamponade, echo ordered, cardiology following, high ESR - but stable CRP, placed on low-dose aspirin and Colchicine. Monitor.  2. ESRD. Dialysis due to tomorrow, on Tuesday-Thursday-Saturday schedule. Renal consulted.  3. History of Sjogren's syndrome and pulmonary fibrosis with chronic respiratory failure. No acute issues. Supportive care at this time. The new home oxygen 2 L nasal cannula per minute.  4.? SIRs on admission - do not think he has ongoing bacterial infection. Follow cultures, influenza negative, CXR stable, no cough, de-escalated antibiotics to oral doxycycline.  5. Chronic systolic heart failure. He has 35-40%. Currently compensated, monitor. On Coreg, no ACE/ARB due to renal failure.   6. Severe protein calorie malnutrition. Nutritional supplements. Informed with patient and family. No significant weight loss history. He is due for outpatient colonoscopy by Dr. Michail Sermon.  7. 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    Code Status :  DNR  Family Communication  : wife and son  Disposition Plan  : remain inpatient  Consults  :  Renal, Cards  Procedures  :   TTE  DVT Prophylaxis  :   Heparin   Lab Results  Component Value Date   PLT 235 11/07/2015    Inpatient Medications  Scheduled Meds: . aspirin EC  81 mg Oral Daily  . carvedilol  3.125 mg Oral BID  . colchicine  0.6 mg Oral Daily  . [START ON 11/12/2015] darbepoetin (ARANESP) injection - DIALYSIS  100 mcg Intravenous Q Wed-HD  . doxycycline  100 mg Oral Q12H  . ferric gluconate (FERRLECIT/NULECIT) IV  125 mg Intravenous Q M,W,F-HD  . heparin subcutaneous  5,000 Units Subcutaneous 3 times per day  . pantoprazole  40 mg Oral Daily  . sevelamer carbonate  1,600 mg Oral TID WC  . sodium chloride flush  3 mL Intravenous Q12H   Continuous Infusions:   PRN Meds:.acetaminophen **OR** [DISCONTINUED] acetaminophen, HYDROcodone-acetaminophen, hydrOXYzine, [DISCONTINUED] ondansetron **OR** ondansetron (ZOFRAN) IV  Antibiotics  :     Anti-infectives    Start     Dose/Rate Route Frequency Ordered Stop   11/07/15 1200  vancomycin (VANCOCIN) IVPB 750 mg/150 ml premix  Status:  Discontinued     750 mg 150 mL/hr over 60 Minutes Intravenous Every M-W-F (Hemodialysis) 11/05/15 2212 11/06/15 1152   11/06/15 1200  doxycycline (VIBRA-TABS) tablet 100 mg     100 mg Oral Every 12 hours 11/06/15 1152  11/06/15 0600  piperacillin-tazobactam (ZOSYN) IVPB 2.25 g  Status:  Discontinued     2.25 g 100 mL/hr over 30 Minutes Intravenous 3 times per day 11/05/15 2212 11/06/15 1152   11/05/15 2215  vancomycin (VANCOCIN) 500 mg in sodium chloride 0.9 % 100 mL IVPB     500 mg 100 mL/hr over 60 Minutes Intravenous  Once 11/05/15 2212 11/05/15 2358   11/05/15 2130  piperacillin-tazobactam (ZOSYN) IVPB 3.375 g     3.375 g 100 mL/hr over 30 Minutes Intravenous  Once 11/05/15 2129 11/05/15 2300   11/05/15 2130  vancomycin (VANCOCIN) IVPB 1000 mg/200 mL premix     1,000 mg 200  mL/hr over 60 Minutes Intravenous  Once 11/05/15 2129 11/05/15 2330        Objective:   Filed Vitals:   11/06/15 1245 11/06/15 1336 11/06/15 2047 11/07/15 0520  BP: 129/81 134/70 128/65 127/67  Pulse: 98 91 98 92  Temp:  98 F (36.7 C) 101.6 F (38.7 C) 98.5 F (36.9 C)  TempSrc:  Oral Oral Oral  Resp: 20 18 18 18   Height:      Weight:      SpO2: 100% 100% 100% 100%    Wt Readings from Last 3 Encounters:  11/05/15 61.689 kg (136 lb)  09/05/15 62.596 kg (138 lb)  08/28/15 63.05 kg (139 lb)     Intake/Output Summary (Last 24 hours) at 11/07/15 1031 Last data filed at 11/07/15 0900  Gross per 24 hour  Intake    240 ml  Output      0 ml  Net    240 ml     Physical Exam  Awake Alert, Oriented X 3, No new F.N deficits, Normal affect [place].AT,PERRAL Supple Neck,No JVD, No cervical lymphadenopathy appriciated.  Symmetrical Chest wall movement, Good air movement bilaterally, CTAB RRR,No Gallops,Rubs or new Murmurs, No Parasternal Heave +ve B.Sounds, Abd Soft, No tenderness, No organomegaly appriciated, No rebound - guarding or rigidity. No Cyanosis, Clubbing or edema, No new Rash or bruise       Data Review:   Micro Results No results found for this or any previous visit (from the past 240 hour(s)).  Radiology Reports Ct Angio Chest Pe W/cm &/or Wo Cm  11/06/2015  CLINICAL DATA:  Acute onset of generalized weakness and malaise. Vomiting. Achiness across the chest. Chronic shortness of breath. Initial encounter. EXAM: CT ANGIOGRAPHY CHEST WITH CONTRAST TECHNIQUE: Multidetector CT imaging of the chest was performed using the standard protocol during bolus administration of intravenous contrast. Multiplanar CT image reconstructions and MIPs were obtained to evaluate the vascular anatomy. CONTRAST:  141m OMNIPAQUE IOHEXOL 350 MG/ML SOLN COMPARISON:  Chest radiograph performed 11/05/2015, and CT of the chest performed 05/16/2015 FINDINGS: There is no evidence of pulmonary  embolus. Small bilateral pleural effusions are noted. Underlying interstitial prominence is seen, raising concern for mild pulmonary edema. A few peripheral patchy opacities within the left lung appear to reflect chronic scarring. There is no evidence of pneumothorax. No masses are identified; no abnormal focal contrast enhancement is seen. There is relatively large pericardial effusion, significantly increased in size from the prior study. Would correlate for any evidence of pericarditis or cardiac tamponade. Scattered calcified mediastinal nodes are noted, without significant mediastinal lymphadenopathy, though evaluation is suboptimal due to the surrounding pericardial effusion. Diffuse coronary artery calcifications are seen. The great vessels are grossly unremarkable in appearance. No axillary lymphadenopathy is seen. The thyroid gland is unremarkable in appearance. The visualized portions of the liver  and spleen are unremarkable. No acute osseous abnormalities are seen. Mild degenerative change is noted along the lower cervical spine. Review of the MIP images confirms the above findings. IMPRESSION: 1. No evidence of pulmonary embolus. 2. Small bilateral pleural effusions noted. Underlying interstitial prominence raises concern for mild pulmonary edema. 3. Relatively large pericardial effusion, significantly increased in size from the prior study. Would correlate for any evidence of pericarditis or cardiac tamponade. 4. Few peripheral patchy opacities within the left lung appear to reflect chronic scarring. 5. Diffuse coronary artery calcifications seen. 6. Scattered calcified mediastinal nodes reflect remote granulomatous disease. Electronically Signed   By: Garald Balding M.D.   On: 11/06/2015 02:01   Dg Chest Portable 1 View  11/05/2015  CLINICAL DATA:  Chest pain for a day. EXAM: PORTABLE CHEST 1 VIEW COMPARISON:  08/19/2015 FINDINGS: 2136 hours. The lungs are clear wiithout focal pneumonia, edema,  pneumothorax or pleural effusion. Interstitial markings are diffusely coarsened with chronic features. Hyperexpansion suggests emphysema. The cardio pericardial silhouette is enlarged. The visualized bony structures of the thorax are intact. Telemetry leads overlie the chest. IMPRESSION: Cardiomegaly with emphysema.  No acute cardiopulmonary findings. Electronically Signed   By: Misty Stanley M.D.   On: 11/05/2015 21:43     CBC  Recent Labs Lab 11/05/15 2130 11/06/15 0831 11/07/15 0706  WBC 12.1* 10.5 9.1  HGB 11.6* 10.3* 10.3*  HCT 38.3* 33.0* 32.5*  PLT 252 184 235  MCV 93.6 95.7 92.6  MCH 28.4 29.9 29.3  MCHC 30.3 31.2 31.7  RDW 16.5* 16.4* 16.4*    Chemistries   Recent Labs Lab 11/05/15 2130 11/06/15 0831  NA 137 135  K 4.1 4.2  CL 92* 97*  CO2 30 25  GLUCOSE 130* 102*  BUN 15 21*  CREATININE 3.89* 4.45*  CALCIUM 9.1 8.9  MG  --  1.7  AST 20 17  ALT 10* 8*  ALKPHOS 111 86  BILITOT 0.4 0.5   ------------------------------------------------------------------------------------------------------------------ No results for input(s): CHOL, HDL, LDLCALC, TRIG, CHOLHDL, LDLDIRECT in the last 72 hours.  Lab Results  Component Value Date   HGBA1C 4.9 11/06/2015   ------------------------------------------------------------------------------------------------------------------  Recent Labs  11/06/15 0831  TSH 5.798*   ------------------------------------------------------------------------------------------------------------------ No results for input(s): VITAMINB12, FOLATE, FERRITIN, TIBC, IRON, RETICCTPCT in the last 72 hours.  Coagulation profile No results for input(s): INR, PROTIME in the last 168 hours.  No results for input(s): DDIMER in the last 72 hours.  Cardiac Enzymes  Recent Labs Lab 11/06/15 0831 11/06/15 1350 11/06/15 2035  TROPONINI 0.05* <0.03 <0.03    ------------------------------------------------------------------------------------------------------------------    Component Value Date/Time   BNP >4500.0* 05/14/2015 0130    Time Spent in minutes   35   SINGH,PRASHANT K M.D on 11/07/2015 at 10:31 AM  Between 7am to 7pm - Pager - [phone]  After 7pm go to [email or web address] - password [facility]  [facility] -  Office  [phone]

## 2015-11-07 NOTE — Progress Notes (Signed)
Per Tiffany at [facility] they can process his PT through [facility] with a $0 copay. CM spoke with [facility] to stop request for authorization for PT through them.

## 2015-11-07 NOTE — Progress Notes (Signed)
  [facility] Progress Note   Subjective: no CP today  Filed Vitals:   11/06/15 1245 11/06/15 1336 11/06/15 2047 11/07/15 0520  BP: 129/81 134/70 128/65 127/67  Pulse: 98 91 98 92  Temp:  98 F (36.7 C) 101.6 F (38.7 C) 98.5 F (36.9 C)  TempSrc:  Oral Oral Oral  Resp: 20 18 18 18   Height:      Weight:      SpO2: 100% 100% 100% 100%    Inpatient medications: . aspirin EC  81 mg Oral Daily  . carvedilol  3.125 mg Oral BID  . colchicine  0.6 mg Oral Daily  . [START ON 11/12/2015] darbepoetin (ARANESP) injection - DIALYSIS  100 mcg Intravenous Q Wed-HD  . doxycycline  100 mg Oral Q12H  . ferric gluconate (FERRLECIT/NULECIT) IV  125 mg Intravenous Q M,W,F-HD  . heparin subcutaneous  5,000 Units Subcutaneous 3 times per day  . pantoprazole  40 mg Oral Daily  . sevelamer carbonate  1,600 mg Oral TID WC  . sodium chloride flush  3 mL Intravenous Q12H     acetaminophen **OR** [DISCONTINUED] acetaminophen, HYDROcodone-acetaminophen, hydrOXYzine, [DISCONTINUED] ondansetron **OR** ondansetron (ZOFRAN) IV  Exam: General: thin chronically ill elderly AAM, nad, OX3 HEENT: [place] MMdry, EOMI Neck: no jvd Heart: RRR no rub heard today, and 1/6 sem , no gallop Lungs: CTA nonlaored brea5thing  Abdomen: bs pos. Soft , NT, ND Extremities: bilat 1+ pedal edema Skin: no overt rash/ darker hue skin trunk compared to face/ no pedal ulcers Neuro: alert ox3/ moves all extrem Dialysis Access: pos bruit RFA AVGG  CXR clear  Dialysis:[facility] on MWF . EDW 62kg HD Bath 2k,2ca Time 4hr Heparin 6000. Access RFA AVGG  Mircera 100 q2wk ( last on 10/29/15) Venofer100 mg load last dose 11/17/15  Other op labs HGB 10.8 2/15 Ca 10. Phos 3.8 pth 94  Assessment/Plan 1. CP/ pericardial effusion - per chest CT. Cardiology evaluating.  2. ESRD MWF HD. HD today.  3. Fever - w/u in progress 4. HO pulmonary Fibrosis/ Sjogren's dz/ chron resp failure 5. HTN/volume - on coreg, BP's  up, LE edema chronic due to pulm HTN/ RHF 6. Anemia - hgb 10.3 ESA q weekly hd And fe load as above 7. Metabolic bone disease - No vit d on hd / phos 4.4 on Renvela binder 8. Chron syst heart failure EF 35-40% 9. Severe prot-calor malnutrition   Plan - HD today   Kelly Splinter MD [facility] pager [phone]    cell [phone] 11/07/2015, 9:31 AM    Recent Labs Lab 11/05/15 2130 11/06/15 0831  NA 137 135  K 4.1 4.2  CL 92* 97*  CO2 30 25  GLUCOSE 130* 102*  BUN 15 21*  CREATININE 3.89* 4.45*  CALCIUM 9.1 8.9  PHOS  --  4.4    Recent Labs Lab 11/05/15 2130 11/06/15 0831  AST 20 17  ALT 10* 8*  ALKPHOS 111 86  BILITOT 0.4 0.5  PROT 8.1 7.1  ALBUMIN 2.3* 2.0*    Recent Labs Lab 11/05/15 2130 11/06/15 0831 11/07/15 0706  WBC 12.1* 10.5 9.1  HGB 11.6* 10.3* 10.3*  HCT 38.3* 33.0* 32.5*  MCV 93.6 95.7 92.6  PLT 252 184 235

## 2015-11-07 NOTE — Care Management Note (Signed)
Case Management Note  Patient Details  Name: Travis [name] MRN: [id number] Date of Birth: [birth year]  Subjective/Objective:                  SPoke with patient in the room. He states that he has home O2 through [facility], cane and walker at home. He needs HH PT after discharge and would like to use [facility]. Referral made and order and H&P and PT note faxed to [facility] [place]. [phone]. She will gett approval through Dr Dell Ponto hopefully today. Tiffany at [facility] updated. Patient has [place] secondary and gets most of his meds through [place], but will get any new meds through [facility].   Action/Plan:  Will need HH PT through [facility] [place].  Expected Discharge Date:                  Expected Discharge Plan:  [place]  In-House Referral:     Discharge planning Services  CM Consult  Post Acute Care Choice:  Home Health Choice offered to:  Patient  DME Arranged:    DME Agency:     HH Arranged:  PT [place]:  [place]  Status of Service:  In process, will continue to follow  Medicare Important Message Given:    Date Medicare IM Given:    Medicare IM give by:    Date Additional Medicare IM Given:    Additional Medicare Important Message give by:     If discussed at [place] of Stay Meetings, dates discussed:    Additional Comments:  Carles Collet, RN 11/07/2015, 11:37 AM

## 2015-11-08 ENCOUNTER — Observation Stay (HOSPITAL_BASED_OUTPATIENT_CLINIC_OR_DEPARTMENT_OTHER): Payer: Medicare Other

## 2015-11-08 DIAGNOSIS — R079 Chest pain, unspecified: Secondary | ICD-10-CM | POA: Diagnosis not present

## 2015-11-08 DIAGNOSIS — Z992 Dependence on renal dialysis: Secondary | ICD-10-CM | POA: Diagnosis not present

## 2015-11-08 DIAGNOSIS — I319 Disease of pericardium, unspecified: Secondary | ICD-10-CM | POA: Diagnosis not present

## 2015-11-08 DIAGNOSIS — I5022 Chronic systolic (congestive) heart failure: Secondary | ICD-10-CM | POA: Diagnosis not present

## 2015-11-08 DIAGNOSIS — N186 End stage renal disease: Secondary | ICD-10-CM | POA: Diagnosis not present

## 2015-11-08 LAB — HEPATITIS B SURFACE ANTIGEN: Hepatitis B Surface Ag: NEGATIVE

## 2015-11-08 LAB — HEPATITIS B CORE ANTIBODY, TOTAL: Hep B Core Total Ab: NEGATIVE

## 2015-11-08 LAB — HEPATITIS B SURFACE ANTIBODY,QUALITATIVE: Hep B S Ab: NONREACTIVE

## 2015-11-08 MED ORDER — COLCHICINE 0.6 MG PO TABS: 0.6000 mg | ORAL_TABLET | Freq: Every day | ORAL | Status: DC

## 2015-11-08 MED ORDER — RENA-VITE PO TABS: 1.0000 | ORAL_TABLET | Freq: Every day | ORAL | Status: DC

## 2015-11-08 MED ORDER — DOXYCYCLINE HYCLATE 100 MG PO TABS: 100.0000 mg | ORAL_TABLET | Freq: Two times a day (BID) | ORAL | Status: DC

## 2015-11-08 MED ORDER — NEPRO/CARBSTEADY PO LIQD: 237.0000 mL | Freq: Two times a day (BID) | ORAL | Status: DC

## 2015-11-08 MED ORDER — FERROUS SULFATE 325 (65 FE) MG PO TABS: 325.0000 mg | ORAL_TABLET | Freq: Two times a day (BID) | ORAL | Status: DC

## 2015-11-08 NOTE — Discharge Summary (Signed)
Travis [name], is a 75 y.o. male  DOB 10/03/1940  MRN [id number].  Admission date:  11/05/2015  Admitting Physician  Toy Baker, MD  Discharge Date:  11/08/2015   Primary MD  [name], MD  Recommendations for primary care physician for things to follow:   Check CBC, BMP, monitor iron panel closely  Close outpatient cardiology follow-up for a repeat echogram in 1-2 weeks, close outpatient GI follow-up for iron deficiency anemia. Patient is also due for colonoscopy by Dr. Michail Sermon.  Please  follow final blood culture results which are pending   Admission Diagnosis  Chest pain on breathing [R07.1] Sepsis, due to unspecified organism [facility]) [A41.9] Chest pain, unspecified chest pain type [R07.9]   Discharge Diagnosis  Chest pain on breathing [R07.1] Sepsis, due to unspecified organism [facility]) [A41.9] Chest pain, unspecified chest pain type [R07.9]     Active Problems:   Fever   Pulmonary fibrosis ([place])   ESRD on dialysis ([place])   Protein-calorie malnutrition, severe (HCC)   Chronic respiratory failure (HCC)   Chronic systolic CHF (congestive heart failure), NYHA class 2 (HCC)   Sepsis (HCC)   SIRS (systemic inflammatory response syndrome) (HCC)   Pericardial effusion      Past Medical History  Diagnosis Date  . Arthritis   . Pneumonia   . GERD (gastroesophageal reflux disease)   . Sjogren's disease ([place]) 01/28/2015  . Pulmonary fibrosis ([place])   . ESRD on hemodialysis [facility]) June 2016    a. 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 Received plasma exchange and steroids but didn't respond and ended up starting hemodialysis June 2016.   Marland Kitchen Essential hypertension   . Chronic combined systolic and diastolic CHF (congestive heart failure) ([place])     a. Etiology  of low EF not defined.  Marland Kitchen DVT (deep venous thrombosis) ([place])     a. 01/2015: RLE DVT. VQ scan intermediate probability. Underwent renal bx complicated by perinephric hematoma; anticoagulation stopped and IVC filter placed.  . Syncope     a. 04/2015 in HD in setting of BP 50s.  . 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 Anemia   . Protein calorie malnutrition ([place])   . Chronic respiratory failure ([place])   . Perinephric hematoma 01/2015  . Unintentional weight loss     Past Surgical History  Procedure Laterality Date  . Hemorroidectomy  1999  . Surgical procedure to remove a mole as a child Right Eye area    At around 20 years old  . Insertion of dialysis catheter N/A 02/17/2015    Procedure: INSERTION OF DIALYSIS CATHETER RIGHT INTERNAL JUGULAR VEIN;  Surgeon: Elam Dutch, MD;  Location: [place];  Service: Vascular;  Laterality: N/A;  . Av fistula placement Right 02/17/2015    Procedure: INSERTION OF RIGHT ARM  ARTERIOVENOUS (AV) GORE-TEX GRAFT ;  Surgeon: Elam Dutch, MD;  Location: [place];  Service: Vascular;  Laterality: Right;  . Flexible sigmoidoscopy N/A 08/04/2015    Procedure: FLEXIBLE SIGMOIDOSCOPY;  Surgeon:  Wilford Corner, MD;  Location: [facility] ENDOSCOPY;  Service: Endoscopy;  Laterality: N/A;       HPI  from the history and physical done on the day of admission:    Travis [name] is a 75 y.o. male   has a past medical history of Medical history non-contributory; Arthritis; Pneumonia; GERD (gastroesophageal reflux disease); Sjogren's disease ([place]) (01/28/2015); Pulmonary fibrosis ([place]); ESRD on hemodialysis [facility]) (June 2016); Acute on chronic diastolic CHF (congestive heart failure) ([place]) (04/18/2015); and Accelerated hypertension (04/19/2015).   Presented with aching chest pain for 2-3 weeks, worse with taking deep breaths he had dialysis today and felt somewhat unwell afterwards he has been overall having fatigue he nausea and vomiting episode times once not associated with diarrhea. and no  abdominal pain.. Patient called EMS in the [place] received aspirin 324 as well as AB aspirin was given by family. He reports not feeling well for at least a week now. Patient baseline on 2 L of oxygen. He has chronic shortness of breath which has not changed no urinary symptoms.  IN ER: Albumin 2.3 on 12.1 hemoglobin 11.6 troponin 0.01 collected at 9:45 PM lactic acid 2.76 collected at 2148 influenza PCR ordered,Regarding pertinent past history: Patient had end-stage renal disease secondary TMA on dialysis since June 2016 on hemodialysis Monday Wednesday Friday through right side fistula was dialysis was today which went well but patient did feel somewhat lightheaded thereafter. Patient has history of pulmonary fibrosis and Sojourn's disease followed by pulmonology also has history of chronic systolic and diastolic heart failure. His Coreg to. Used due to hypotension and syncope after dialysis in August        Hospital Course:     1. Acute on chronic substernal chest discomfort. Noted to have moderate to large pericardial effusion on CT scan, has had some effusion for a while, clinically does not appear to have tamponade, echo noted as below with large pericardial effusion without any tamponade physiology, cardiology saw the patient and cleared him for home discharge with close outpatient cardiology follow-up and a repeat echogram within 1-2 weeks cardiology requested to continue aspirin colchicine combination upon discharge. Patient is clinically symptom-free at this point without any clinical signs and symptoms of tamponade his ESR was high but CRP was stable. He will be discharged home with outpatient cardiology follow-up, will be placed on aspirin and colchicine upon discharge.   2. ESRD. Dialysis due to tomorrow, on Tuesday-Thursday-Saturday schedule. Renal consulted.  3. History of Sjogren's syndrome and pulmonary fibrosis with chronic respiratory failure. No acute issues. Supportive care at  this time. He his on home oxygen 2 L nasal cannula per minute.  4.? SIRs on admission - do not think he has ongoing bacterial infection. So far negative blood cultures, influenza negative, CXR stable, no cough, de-escalated antibiotics to oral doxycycline for 3 more days. Kindly follow final blood culture results.  5. Chronic systolic heart failure. He has 35-40%. Currently compensated, monitor. On Coreg, no ACE/ARB due to renal failure.   6. Severe protein calorie malnutrition. Nutritional supplements. Informed with patient and family. No significant weight loss history. He is due for outpatient colonoscopy by Dr. Michail Sermon.  7. 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       Discharge Condition: Fair  Follow UP  Follow-up Information    Follow up with [facility].   Why:  PT. Will call 1-2 days after discharge to set up first  home visit   Contact information:   [address] [place] [address] [phone]       Follow up with [name], MD. Schedule an appointment as soon as possible for a visit in 1 week.   Specialty:  Family Medicine   Contact information:   [place] [address] [place] [address] [phone]       Follow up with Dorothy Spark, MD. Schedule an appointment as soon as possible for a visit in 1 week.   Specialty:  Cardiology   Contact information:   [place] [id number] [phone]        Consults obtained - Cards, Renal  Diet and Activity recommendation: See Discharge Instructions below  Discharge Instructions       Discharge Instructions    Discharge instructions    Complete by:  As directed   Follow with Primary MD [name], MD in 7 days   Get CBC, CMP, 2 view Chest X ray checked  by Primary MD next visit.    Activity: As tolerated with Full fall precautions use walker/cane & assistance  as needed   Disposition Home     Diet:   Renal. - Check your Weight same time everyday, if you gain over 2 pounds, or you develop in leg swelling, experience more shortness of breath or chest pain, call your Primary MD immediately. Follow Cardiac Low Salt Diet and 1.2 lit/day fluid restriction.   On your next visit with your primary care physician please Get Medicines reviewed and adjusted.   Please request your Prim.MD to go over all Hospital Tests and Procedure/Radiological results at the follow up, please get all Hospital records sent to your Prim MD by signing hospital release before you go home.   If you experience worsening of your admission symptoms, develop shortness of breath, life threatening emergency, suicidal or homicidal thoughts you must seek medical attention immediately by calling 911 or calling your MD immediately  if symptoms less severe.  You Must read complete instructions/literature along with all the possible adverse reactions/side effects for all the Medicines you take and that have been prescribed to you. Take any new Medicines after you have completely understood and accpet all the possible adverse reactions/side effects.   Do not drive, operating heavy machinery, perform activities at heights, swimming or participation in water activities or provide baby sitting services if your were admitted for syncope or siezures until you have seen by Primary MD or a Neurologist and advised to do so again.  Do not drive when taking Pain medications.    Do not take more than prescribed Pain, Sleep and Anxiety Medications  Special Instructions: If you have smoked or chewed Tobacco  in the last 2 yrs please stop smoking, stop any regular Alcohol  and or any Recreational drug use.  Wear Seat belts while driving.   Please note  You were cared for by a hospitalist during your hospital stay. If you have any questions about your discharge medications or the care you received  while you were in the hospital after you are discharged, you can call the unit and asked to speak with the hospitalist on call if the hospitalist that took care of you is not available. Once you are discharged, your primary care physician will handle any further medical issues. Please note that NO REFILLS for any discharge medications will be authorized once you are discharged, as it is imperative that you return to  your primary care physician (or establish a relationship with a primary care physician if you do not have one) for your aftercare needs so that they can reassess your need for medications and monitor your lab values.     Increase activity slowly    Complete by:  As directed              Discharge Medications       Medication List    TAKE these medications        aspirin 81 MG EC tablet  Take 1 tablet (81 mg total) by mouth daily.     atorvastatin 40 MG tablet  Commonly known as:  LIPITOR  Take 1 tablet (40 mg total) by mouth daily at 6 PM.     carvedilol 3.125 MG tablet  Commonly known as:  COREG  Take 1 tablet by mouth 2 (two) times daily.     colchicine 0.6 MG tablet  Take 1 tablet (0.6 mg total) by mouth daily.     [facility] TUSSIN DM 10-100 MG/5ML liquid  Generic drug:  dextromethorphan-guaiFENesin  Take 5 mLs by mouth every other day as needed for cough.     cyanocobalamin 500 MCG tablet  Take 1 tablet (500 mcg total) by mouth daily.     doxycycline 100 MG tablet  Commonly known as:  VIBRA-TABS  Take 1 tablet (100 mg total) by mouth every 12 (twelve) hours.     ferrous sulfate 325 (65 FE) MG tablet  Take 1 tablet (325 mg total) by mouth 2 (two) times daily with a meal.     hydrOXYzine 25 MG capsule  Commonly known as:  VISTARIL  Take 25 mg by mouth at bedtime as needed (sleep. May repeat 2 hours later if necessary).     multivitamin Tabs tablet  Take 1 tablet by mouth daily.     omeprazole 20 MG capsule  Commonly known as:  PRILOSEC  TAKE 1 CAPSULE (20  MG TOTAL) BY MOUTH DAILY.     sevelamer carbonate 800 MG tablet  Commonly known as:  RENVELA  Take 2 tablets (1,600 mg total) by mouth 3 (three) times daily with meals.        Major procedures and Radiology Reports - PLEASE review detailed and final reports for all details, in brief -    TTE  - Left ventricle: The cavity size was normal. Wall thickness was increased in a pattern of mild LVH. Systolic function was normal. The estimated ejection fraction was in the range of 55% to 60%. Wall motion was normal; there were no regional wall motion abnormalities. Doppler parameters are consistent with abnormal left ventricular relaxation (grade 1 diastolic dysfunction). - Aortic valve: Mildly to moderately calcified annulus. Trileaflet. There was mild regurgitation. - Mitral valve: Mildly calcified anterior leaflet tip. There was mild regurgitation. - Left atrium: The atrium was moderately dilated. - Tricuspid valve: There was mild regurgitation. - Inferior vena cava: The vessel was normal in size. The respirophasic diameter changes were in the normal range (>= 50%), consistent with normal central venous pressure. - Pericardium, extracardiac: A moderate to large circumferential pericardial effusion noted. While there was some right atrial free wall impingement/inversion, overall features were not consistent with tamponade physiology.   Ct Angio Chest Pe W/cm &/or Wo Cm  11/06/2015  CLINICAL DATA:  Acute onset of generalized weakness and malaise. Vomiting. Achiness across the chest. Chronic shortness of breath. Initial encounter. EXAM: CT ANGIOGRAPHY CHEST WITH CONTRAST TECHNIQUE: Multidetector CT imaging  of the chest was performed using the standard protocol during bolus administration of intravenous contrast. Multiplanar CT image reconstructions and MIPs were obtained to evaluate the vascular anatomy. CONTRAST:  144m OMNIPAQUE IOHEXOL 350 MG/ML SOLN COMPARISON:   Chest radiograph performed 11/05/2015, and CT of the chest performed 05/16/2015 FINDINGS: There is no evidence of pulmonary embolus. Small bilateral pleural effusions are noted. Underlying interstitial prominence is seen, raising concern for mild pulmonary edema. A few peripheral patchy opacities within the left lung appear to reflect chronic scarring. There is no evidence of pneumothorax. No masses are identified; no abnormal focal contrast enhancement is seen. There is relatively large pericardial effusion, significantly increased in size from the prior study. Would correlate for any evidence of pericarditis or cardiac tamponade. Scattered calcified mediastinal nodes are noted, without significant mediastinal lymphadenopathy, though evaluation is suboptimal due to the surrounding pericardial effusion. Diffuse coronary artery calcifications are seen. The great vessels are grossly unremarkable in appearance. No axillary lymphadenopathy is seen. The thyroid gland is unremarkable in appearance. The visualized portions of the liver and spleen are unremarkable. No acute osseous abnormalities are seen. Mild degenerative change is noted along the lower cervical spine. Review of the MIP images confirms the above findings. IMPRESSION: 1. No evidence of pulmonary embolus. 2. Small bilateral pleural effusions noted. Underlying interstitial prominence raises concern for mild pulmonary edema. 3. Relatively large pericardial effusion, significantly increased in size from the prior study. Would correlate for any evidence of pericarditis or cardiac tamponade. 4. Few peripheral patchy opacities within the left lung appear to reflect chronic scarring. 5. Diffuse coronary artery calcifications seen. 6. Scattered calcified mediastinal nodes reflect remote granulomatous disease. Electronically Signed   By: JGarald BaldingM.D.   On: 11/06/2015 02:01   Dg Chest Portable 1 View  11/05/2015  CLINICAL DATA:  Chest pain for a day. EXAM:  PORTABLE CHEST 1 VIEW COMPARISON:  08/19/2015 FINDINGS: 2136 hours. The lungs are clear wiithout focal pneumonia, edema, pneumothorax or pleural effusion. Interstitial markings are diffusely coarsened with chronic features. Hyperexpansion suggests emphysema. The cardio pericardial silhouette is enlarged. The visualized bony structures of the thorax are intact. Telemetry leads overlie the chest. IMPRESSION: Cardiomegaly with emphysema.  No acute cardiopulmonary findings. Electronically Signed   By: EMisty StanleyM.D.   On: 11/05/2015 21:43    Micro Results      Recent Results (from the past 240 hour(s))  Culture, blood (routine x 2)     Status: None (Preliminary result)   Collection Time: 11/05/15  9:59 PM  Result Value Ref Range Status   Specimen Description BLOOD LEFT ARM  Final   Special Requests IN PEDIATRIC BOTTLE 3ML  Final   Culture NO GROWTH 2 DAYS  Final   Report Status PENDING  Incomplete  Culture, blood (routine x 2)     Status: None (Preliminary result)   Collection Time: 11/05/15 10:04 PM  Result Value Ref Range Status   Specimen Description BLOOD LEFT HAND  Final   Special Requests IN PEDIATRIC BOTTLE 3ML  Final   Culture NO GROWTH 2 DAYS  Final   Report Status PENDING  Incomplete       Today   Subjective    Travis Kaczmarektoday has no headache,no chest abdominal pain,no new weakness tingling or numbness, feels much better wants to go home today.     Objective   Blood pressure 128/72, pulse 92, temperature 98.4 F (36.9 C), temperature source Oral, resp. rate 18, height 5' 10"  (1.778 m),  weight 67.9 kg (149 lb 11.1 oz), SpO2 99 %.   Intake/Output Summary (Last 24 hours) at 11/08/15 1441 Last data filed at 11/07/15 1545  Gross per 24 hour  Intake      0 ml  Output      0 ml  Net      0 ml    Exam Awake Alert, Oriented x 3, No new F.N deficits, Normal affect [place].AT,PERRAL Supple Neck,No JVD, No cervical lymphadenopathy appriciated.  Symmetrical Chest  wall movement, Good air movement bilaterally, CTAB RRR,No Gallops,Rubs or new Murmurs, No Parasternal Heave +ve B.Sounds, Abd Soft, Non tender, No organomegaly appriciated, No rebound -guarding or rigidity. No Cyanosis, Clubbing or edema, No new Rash or bruise   Data Review   CBC w Diff: Lab Results  Component Value Date   WBC 9.5 11/07/2015   HGB 9.7* 11/07/2015   HCT 31.6* 11/07/2015   PLT 236 11/07/2015   LYMPHOPCT 7 11/07/2015   MONOPCT 20 11/07/2015   EOSPCT 4 11/07/2015   BASOPCT 1 11/07/2015    CMP: Lab Results  Component Value Date   NA 132* 11/07/2015   K 4.3 11/07/2015   CL 95* 11/07/2015   CO2 23 11/07/2015   BUN 33* 11/07/2015   CREATININE 6.00* 11/07/2015   PROT 7.1 11/06/2015   ALBUMIN 2.0* 11/07/2015   BILITOT 0.5 11/06/2015   ALKPHOS 86 11/06/2015   AST 17 11/06/2015   ALT 8* 11/06/2015  .   Total Time in preparing paper work, data evaluation and todays exam - 35 minutes  Thurnell Lose M.D on 11/08/2015 at 2:41 PM  [facility]   Office  [phone]

## 2015-11-08 NOTE — Progress Notes (Signed)
Discharge instructions reviewed with wife patient and son.  Patient and wife verbalized understanding. [facility]. IV removed and catheter intact. No problems noted

## 2015-11-08 NOTE — Progress Notes (Signed)
SUBJECTIVE: The patient is doing well today.  At this time, he denies chest pain, shortness of breath, or any new concerns.  Feels well without major complaints.  Marland Kitchen aspirin EC  81 mg Oral Daily  . carvedilol  3.125 mg Oral BID  . colchicine  0.6 mg Oral Daily  . [START ON 11/12/2015] darbepoetin (ARANESP) injection - DIALYSIS  100 mcg Intravenous Q Wed-HD  . doxycycline  100 mg Oral Q12H  . feeding supplement (NEPRO CARB STEADY)  237 mL Oral BID BM  . ferric gluconate (FERRLECIT/NULECIT) IV  125 mg Intravenous Q M,W,F-HD  . heparin subcutaneous  5,000 Units Subcutaneous 3 times per day  . multivitamin  1 tablet Oral QHS  . pantoprazole  40 mg Oral Daily  . sevelamer carbonate  1,600 mg Oral TID WC  . sodium chloride flush  3 mL Intravenous Q12H      OBJECTIVE: Physical Exam: Filed Vitals:   11/07/15 1530 11/07/15 1545 11/07/15 2308 11/08/15 0521  BP: 143/81 143/82 129/69 128/72  Pulse: 94 94 100 92  Temp:  97.5 F (36.4 C) 99.3 F (37.4 C) 98.4 F (36.9 C)  TempSrc:  Oral Oral Oral  Resp:  20 18 18   Height:      Weight:  149 lb 4 oz (67.7 kg)  149 lb 11.1 oz (67.9 kg)  SpO2:   98% 99%    Intake/Output Summary (Last 24 hours) at 11/08/15 1425 Last data filed at 11/07/15 1545  Gross per 24 hour  Intake      0 ml  Output      0 ml  Net      0 ml    Telemetry reveals sinus rhythm  GEN- The patient is well appearing, alert and oriented x 3 today.   Head- normocephalic, atraumatic Eyes-  Sclera clear, conjunctiva pink Ears- hearing intact Oropharynx- clear Neck- supple, no JVP Lymph- no cervical lymphadenopathy Lungs- Clear to ausculation bilaterally, normal work of breathing Heart- Regular rate and rhythm, no murmurs, rubs or gallops, PMI not laterally displaced GI- soft, NT, ND, + BS Extremities- no clubbing, cyanosis, or edema Skin- no rash or lesion Psych- euthymic mood, full affect Neuro- strength and sensation are intact  LABS: Basic Metabolic  Panel:  Recent Labs  11/06/15 0831 11/07/15 1215  NA 135 132*  K 4.2 4.3  CL 97* 95*  CO2 25 23  GLUCOSE 102* 103*  BUN 21* 33*  CREATININE 4.45* 6.00*  CALCIUM 8.9 8.9  MG 1.7  --   PHOS 4.4 5.2*   Liver Function Tests:  Recent Labs  11/05/15 2130 11/06/15 0831 11/07/15 1215  AST 20 17  --   ALT 10* 8*  --   ALKPHOS 111 86  --   BILITOT 0.4 0.5  --   PROT 8.1 7.1  --   ALBUMIN 2.3* 2.0* 2.0*   No results for input(s): LIPASE, AMYLASE in the last 72 hours. CBC:  Recent Labs  11/07/15 0706 11/07/15 1215  WBC 9.1 9.5  NEUTROABS  --  6.4  HGB 10.3* 9.7*  HCT 32.5* 31.6*  MCV 92.6 91.3  PLT 235 236   Cardiac Enzymes:  Recent Labs  11/06/15 0831 11/06/15 1350 11/06/15 2035  TROPONINI 0.05* <0.03 <0.03   BNP: Invalid input(s): POCBNP D-Dimer: No results for input(s): DDIMER in the last 72 hours. Hemoglobin A1C:  Recent Labs  11/06/15 0831  HGBA1C 4.9   Fasting Lipid Panel: No results for input(s): CHOL, HDL, LDLCALC,  TRIG, CHOLHDL, LDLDIRECT in the last 72 hours. Thyroid Function Tests:  Recent Labs  11/06/15 0831  TSH 5.798*   Anemia Panel: No results for input(s): VITAMINB12, FOLATE, FERRITIN, TIBC, IRON, RETICCTPCT in the last 72 hours.  RADIOLOGY: Ct Angio Chest Pe W/cm &/or Wo Cm  11/06/2015  CLINICAL DATA:  Acute onset of generalized weakness and malaise. Vomiting. Achiness across the chest. Chronic shortness of breath. Initial encounter. EXAM: CT ANGIOGRAPHY CHEST WITH CONTRAST TECHNIQUE: Multidetector CT imaging of the chest was performed using the standard protocol during bolus administration of intravenous contrast. Multiplanar CT image reconstructions and MIPs were obtained to evaluate the vascular anatomy. CONTRAST:  151mL OMNIPAQUE IOHEXOL 350 MG/ML SOLN COMPARISON:  Chest radiograph performed 11/05/2015, and CT of the chest performed 05/16/2015 FINDINGS: There is no evidence of pulmonary embolus. Small bilateral pleural effusions  are noted. Underlying interstitial prominence is seen, raising concern for mild pulmonary edema. A few peripheral patchy opacities within the left lung appear to reflect chronic scarring. There is no evidence of pneumothorax. No masses are identified; no abnormal focal contrast enhancement is seen. There is relatively large pericardial effusion, significantly increased in size from the prior study. Would correlate for any evidence of pericarditis or cardiac tamponade. Scattered calcified mediastinal nodes are noted, without significant mediastinal lymphadenopathy, though evaluation is suboptimal due to the surrounding pericardial effusion. Diffuse coronary artery calcifications are seen. The great vessels are grossly unremarkable in appearance. No axillary lymphadenopathy is seen. The thyroid gland is unremarkable in appearance. The visualized portions of the liver and spleen are unremarkable. No acute osseous abnormalities are seen. Mild degenerative change is noted along the lower cervical spine. Review of the MIP images confirms the above findings. IMPRESSION: 1. No evidence of pulmonary embolus. 2. Small bilateral pleural effusions noted. Underlying interstitial prominence raises concern for mild pulmonary edema. 3. Relatively large pericardial effusion, significantly increased in size from the prior study. Would correlate for any evidence of pericarditis or cardiac tamponade. 4. Few peripheral patchy opacities within the left lung appear to reflect chronic scarring. 5. Diffuse coronary artery calcifications seen. 6. Scattered calcified mediastinal nodes reflect remote granulomatous disease. Electronically Signed   By: Garald Balding M.D.   On: 11/06/2015 02:01   Dg Chest Portable 1 View  11/05/2015  CLINICAL DATA:  Chest pain for a day. EXAM: PORTABLE CHEST 1 VIEW COMPARISON:  08/19/2015 FINDINGS: 2136 hours. The lungs are clear wiithout focal pneumonia, edema, pneumothorax or pleural effusion. Interstitial  markings are diffusely coarsened with chronic features. Hyperexpansion suggests emphysema. The cardio pericardial silhouette is enlarged. The visualized bony structures of the thorax are intact. Telemetry leads overlie the chest. IMPRESSION: Cardiomegaly with emphysema.  No acute cardiopulmonary findings. Electronically Signed   By: Misty Stanley M.D.   On: 11/05/2015 21:43    ASSESSMENT AND PLAN:  Active Problems:   Fever   Pulmonary fibrosis (HCC)   ESRD on dialysis ([place])   Protein-calorie malnutrition, severe (HCC)   Chronic respiratory failure (HCC)   Chronic systolic CHF (congestive heart failure), NYHA class 2 (HCC)   Sepsis (HCC)   SIRS (systemic inflammatory response syndrome) (HCC)   Pericardial effusion  TTE shows large pericardial effusion without tamponade physiology.  EF is currently normal at 55-60%.  At this time, there is no acute reason for drainage.  Would continue supportive management of effusion.  It is likely that the effusion is due to ESRD and possibly immune disease.  Continue colchicine.   Would recommend limited TTE in  a week to determine if the effusion has changed.  Deondria Puryear Meredith Leeds, MD 11/08/2015 2:25 PM

## 2015-11-08 NOTE — Progress Notes (Signed)
[facility] Progress Note  Assessment/Plan: 1. CP/ pericardial effusion - per chest CT. Cardiology evaluating.  2. ESRD MWF -next HD Monday 3. Fever - w/u in progress tmax 99.3 cxr neg, influencza neg BC neg to date -  4. HO pulmonary Fibrosis/ Sjogren's dz/ chron resp failure 5. HTN/volume - on coreg, BP's up, LE edema chronic due to pulm HTN/ RHF- He was kept even on HD Friday; wts sig above EDW ?? Accuracy - I think he has room to lower volume; Echo 2/25 pending 6. Anemia - hgb 9.7 ESA q weekly hd + Fe load 7. Metabolic bone disease - No vit d on hd / phos 4.4 on Renvela binder 8. Chron syst heart failure EF 35-40%; Echo 2/25 pending 9. Severe prot-calor malnutrition alb 2 - added vitamins and supplements  Myriam Jacobson, PA-C [place] [phone] 11/08/2015,11:28 AM   Pt seen, examined and agree w A/P as above. Prob for dc today.  Kelly Splinter MD [facility] pager [phone]    cell [phone] 11/08/2015, 12:41 PM    Subjective:   No c/o wants to go home; just had BM  Objective Filed Vitals:   11/07/15 1530 11/07/15 1545 11/07/15 2308 11/08/15 0521  BP: 143/81 143/82 129/69 128/72  Pulse: 94 94 100 92  Temp:  97.5 F (36.4 C) 99.3 F (37.4 C) 98.4 F (36.9 C)  TempSrc:  Oral Oral Oral  Resp:  20 18 18   Height:      Weight:  67.7 kg (149 lb 4 oz)  67.9 kg (149 lb 11.1 oz)  SpO2:   98% 99%   Physical Exam General: NAD, thin, weak Heart:  RRR Lungs: dim BS grossly clear Abdomen: soft  Extremities:1+ LE edema Dialysis Access: right lower AVGG + bruit  Dialysis Orders: [facility] on MWF . EDW 62kg HD Bath 2k,2ca Time 4hr Heparin 6000. Access RFA AVGG  Mircera 100 q2wk ( last on 10/29/15) Venofer100 mg load last dose 11/17/15  Other op labs HGB 10.8 2/15 Ca 10. Phos 3.8 pth 94   Additional Objective Labs: Basic Metabolic Panel:  Recent Labs Lab 11/05/15 2130 11/06/15 0831 11/07/15 1215  NA  137 135 132*  K 4.1 4.2 4.3  CL 92* 97* 95*  CO2 30 25 23   GLUCOSE 130* 102* 103*  BUN 15 21* 33*  CREATININE 3.89* 4.45* 6.00*  CALCIUM 9.1 8.9 8.9  PHOS  --  4.4 5.2*   Liver Function Tests:  Recent Labs Lab 11/05/15 2130 11/06/15 0831 11/07/15 1215  AST 20 17  --   ALT 10* 8*  --   ALKPHOS 111 86  --   BILITOT 0.4 0.5  --   PROT 8.1 7.1  --   ALBUMIN 2.3* 2.0* 2.0*   CBC:  Recent Labs Lab 11/05/15 2130 11/06/15 0831 11/07/15 0706 11/07/15 1215  WBC 12.1* 10.5 9.1 9.5  NEUTROABS  --   --   --  6.4  HGB 11.6* 10.3* 10.3* 9.7*  HCT 38.3* 33.0* 32.5* 31.6*  MCV 93.6 95.7 92.6 91.3  PLT 252 184 235 236   Blood Culture    Component Value Date/Time   SDES BLOOD LEFT HAND 11/05/2015 2204   SPECREQUEST IN PEDIATRIC BOTTLE 3ML 11/05/2015 2204   CULT NO GROWTH 1 DAY 11/05/2015 2204   REPTSTATUS PENDING 11/05/2015 2204    Cardiac Enzymes:  Recent Labs Lab 11/06/15 0831 11/06/15 1350 11/06/15 2035  TROPONINI 0.05* <0.03 <0.03  Medications:   .  aspirin EC  81 mg Oral Daily  . carvedilol  3.125 mg Oral BID  . colchicine  0.6 mg Oral Daily  . [START ON 11/12/2015] darbepoetin (ARANESP) injection - DIALYSIS  100 mcg Intravenous Q Wed-HD  . doxycycline  100 mg Oral Q12H  . ferric gluconate (FERRLECIT/NULECIT) IV  125 mg Intravenous Q M,W,F-HD  . heparin subcutaneous  5,000 Units Subcutaneous 3 times per day  . pantoprazole  40 mg Oral Daily  . sevelamer carbonate  1,600 mg Oral TID WC  . sodium chloride flush  3 mL Intravenous Q12H

## 2015-11-08 NOTE — Progress Notes (Signed)
  Echocardiogram 2D Echocardiogram has been performed.  Travis [name] 11/08/2015, 9:09 AM

## 2015-11-08 NOTE — Discharge Instructions (Signed)
Follow with Primary MD [name], MD in 7 days   Get CBC, CMP, 2 view Chest X ray checked  by Primary MD next visit.    Activity: As tolerated with Full fall precautions use walker/cane & assistance as needed   Disposition Home     Diet:   Renal. - Check your Weight same time everyday, if you gain over 2 pounds, or you develop in leg swelling, experience more shortness of breath or chest pain, call your Primary MD immediately. Follow Cardiac Low Salt Diet and 1.2 lit/day fluid restriction.   On your next visit with your primary care physician please Get Medicines reviewed and adjusted.   Please request your Prim.MD to go over all Hospital Tests and Procedure/Radiological results at the follow up, please get all Hospital records sent to your Prim MD by signing hospital release before you go home.   If you experience worsening of your admission symptoms, develop shortness of breath, life threatening emergency, suicidal or homicidal thoughts you must seek medical attention immediately by calling 911 or calling your MD immediately  if symptoms less severe.  You Must read complete instructions/literature along with all the possible adverse reactions/side effects for all the Medicines you take and that have been prescribed to you. Take any new Medicines after you have completely understood and accpet all the possible adverse reactions/side effects.   Do not drive, operating heavy machinery, perform activities at heights, swimming or participation in water activities or provide baby sitting services if your were admitted for syncope or siezures until you have seen by Primary MD or a Neurologist and advised to do so again.  Do not drive when taking Pain medications.    Do not take more than prescribed Pain, Sleep and Anxiety Medications  Special Instructions: If you have smoked or chewed Tobacco  in the last 2 yrs please stop smoking, stop any regular Alcohol  and or any Recreational drug  use.  Wear Seat belts while driving.   Please note  You were cared for by a hospitalist during your hospital stay. If you have any questions about your discharge medications or the care you received while you were in the hospital after you are discharged, you can call the unit and asked to speak with the hospitalist on call if the hospitalist that took care of you is not available. Once you are discharged, your primary care physician will handle any further medical issues. Please note that NO REFILLS for any discharge medications will be authorized once you are discharged, as it is imperative that you return to your primary care physician (or establish a relationship with a primary care physician if you do not have one) for your aftercare needs so that they can reassess your need for medications and monitor your lab values.

## 2015-11-11 LAB — CULTURE, BLOOD (ROUTINE X 2)
CULTURE: NO GROWTH
Culture: NO GROWTH

## 2015-11-18 ENCOUNTER — Other Ambulatory Visit: Payer: Self-pay | Admitting: Family Medicine

## 2015-11-18 ENCOUNTER — Encounter: Payer: Self-pay | Admitting: Cardiology

## 2015-11-18 ENCOUNTER — Ambulatory Visit
Admission: RE | Admit: 2015-11-18 | Discharge: 2015-11-18 | Disposition: A | Discharge status: Home / Self Care | Payer: Medicare Other | Source: Ambulatory Visit | Attending: Family Medicine | Admitting: Family Medicine

## 2015-11-18 ENCOUNTER — Ambulatory Visit (INDEPENDENT_AMBULATORY_CARE_PROVIDER_SITE_OTHER): Payer: Medicare Other | Admitting: Cardiology

## 2015-11-18 VITALS — BP 132/76 | HR 96 | Ht 70.0 in | Wt 125.0 lb

## 2015-11-18 DIAGNOSIS — I3139 Other pericardial effusion (noninflammatory): Secondary | ICD-10-CM

## 2015-11-18 DIAGNOSIS — Z09 Encounter for follow-up examination after completed treatment for conditions other than malignant neoplasm: Secondary | ICD-10-CM

## 2015-11-18 DIAGNOSIS — I5042 Chronic combined systolic (congestive) and diastolic (congestive) heart failure: Secondary | ICD-10-CM

## 2015-11-18 DIAGNOSIS — I10 Essential (primary) hypertension: Secondary | ICD-10-CM | POA: Diagnosis not present

## 2015-11-18 DIAGNOSIS — I313 Pericardial effusion (noninflammatory): Secondary | ICD-10-CM

## 2015-11-18 DIAGNOSIS — I319 Disease of pericardium, unspecified: Secondary | ICD-10-CM

## 2015-11-18 DIAGNOSIS — J849 Interstitial pulmonary disease, unspecified: Secondary | ICD-10-CM

## 2015-11-18 NOTE — Patient Instructions (Signed)
Medication Instructions:  Your physician recommends that you continue on your current medications as directed. Please refer to the Current Medication list given to you today. CONTINUE THE COLCHICINE FOR 3 MONTHS  Labwork: None ordered  Testing/Procedures: Your physician has requested that you have an echocardiogram. Echocardiography is a painless test that uses sound waves to create images of your heart. It provides your doctor with information about the size and shape of your heart and how well your heart's chambers and valves are working. This procedure takes approximately one hour. There are no restrictions for this procedure.    Follow-Up: Your physician recommends that you schedule a follow-up appointment in: 3 MONTHS WITH DR. Meda Coffee   Any Other Special Instructions Will Be Listed Below (If Applicable).     If you need a refill on your cardiac medications before your next appointment, please call your pharmacy.

## 2015-11-18 NOTE — Progress Notes (Signed)
11/18/2015 Travis [name]   10/14/1940  [id number]  Primary Physician [name], MD Primary Cardiologist: Dr. Mare Ferrari Dr. 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 He recently presented to [facility] with complaint of chest discomfort, weakness and fever. IM treated for sepsis.  Chest CT suggested the presence of a pericardial effusion. Subsequent TTE confirmed a large pericardial effusion without tamponade physiology. EF was normal at 55-60%.He was seen by our team at [facility] and was was felt to be stable. There was no acute reason for drainage. Supportive management of effusion was recommended.He was treated with colchicine.At time of discharge, it was recommended that he undergo a 1 week f/u TEE to assess for interval change.   He reports to clinic for post hospital follow-up. He is accompanied by both his wife and his grandson. He reports that he has done well since discharge. He denies any recurrent chest discomfort. No pleuritic chest pain. No dyspnea, lightheadedness, dizziness, syncope/near-syncope. He has been compliant with colchicine. He denies any other issues since discharge.   Current Outpatient Prescriptions  Medication Sig Dispense Refill  . aspirin EC 81 MG EC tablet Take 1 tablet (81 mg total) by mouth daily. 30 tablet 0  . atorvastatin (LIPITOR) 40 MG tablet Take 1 tablet (40 mg total) by mouth daily at 6 PM. 30 tablet 0  . carvedilol (COREG) 3.125 MG tablet Take 1 tablet by mouth 2 (two) times daily.  5  . colchicine 0.6 MG tablet Take 1 tablet (0.6 mg  total) by mouth daily. 30 tablet 0  . [employer] TUSSIN DM 100-10 MG/5ML liquid Take 5 mLs by mouth every other day as needed for cough.   0  . cyanocobalamin 500 MCG tablet Take 1 tablet (500 mcg total) by mouth daily. 30 tablet 0  . doxycycline (VIBRA-TABS) 100 MG tablet Take 1 tablet (100 mg total) by mouth every 12 (twelve) hours. 6 tablet 0  . ferrous sulfate 325 (65 FE) MG tablet Take 1 tablet (325 mg total) by mouth 2 (two) times daily with a meal. 60 tablet 0  . hydrOXYzine (VISTARIL) 25 MG capsule Take 25 mg by mouth at bedtime as needed (sleep. May repeat 2 hours later if necessary).    . multivitamin (RENA-VIT) TABS tablet Take 1 tablet by mouth daily.    Marland Kitchen omeprazole (PRILOSEC) 20 MG capsule TAKE 1 CAPSULE (20 MG TOTAL) BY MOUTH DAILY.  11  . sevelamer carbonate (RENVELA) 800 MG tablet Take 2 tablets (1,600 mg total) by mouth 3 (three) times daily with meals. 90 tablet 0   No current facility-administered medications for this visit.    No Known Allergies  Social History   Social History  . Marital Status: Married    Spouse Name: N/A  . Number of Children: N/A  . Years of Education: N/A   Occupational History  . Not on file.   Social History Main Topics  . Smoking status: Never Smoker   . Smokeless tobacco: Never Used  . Alcohol Use: No  . Drug Use: No  . Sexual Activity: Not on file   Other  Topics Concern  . Not on file   Social History Narrative     Review of Systems: General: negative for chills, fever, night sweats or weight changes.  Cardiovascular: negative for chest pain, dyspnea on exertion, edema, orthopnea, palpitations, paroxysmal nocturnal dyspnea or shortness of breath Dermatological: negative for rash Respiratory: negative for cough or wheezing Urologic: negative for hematuria Abdominal: negative for nausea, vomiting, diarrhea, bright red blood per rectum, melena, or hematemesis Neurologic: negative for visual changes, syncope, or dizziness All other  systems reviewed and are otherwise negative except as noted above.    Blood pressure 132/76, pulse 96, height 5\' 10"  (1.778 m), weight 125 lb (56.7 kg).  General appearance: alert, cooperative, no distress and frail appearing, in wheel chair Neck: no carotid bruit and no JVD Lungs: clear to auscultation bilaterally Heart: regular rate and rhythm, S1, S2 normal, no murmur, click, rub or gallop Extremities: no LEE Pulses: 2+ and symmetric Skin: warm and dry Neurologic: Grossly normal  EKG NSR. Normal QRS complex appearance  ASSESSMENT AND PLAN:   1. Pericardial Effusion: Echocardiogram during recent hospitalization showed a large pericardial effusion without evidence of tamponade physiology. He has been medically managed with colchicine and he notes improvement in symptoms. No recurrent chest discomfort. He also has remained hemodynamically stable. His blood pressure today in clinic is 132/76. His physical exam is without JVD. Heart sounds are clear to auscultation and are not muffled. EKG shows normal sinus rhythm with normal appearing QRS complexes. He denies any chest pressure, dyspnea, syncope/near-syncope. As recommended by our doctors who assessed him during his hospitalization, we will arrange for a follow-up limited 2-D echo to reassess for interval change in the size of his pericardial effusion. He is to continue colchicine for minimum of 3 months.   PLAN  F/u limited 2D echo as outlined above. F/u with MD in 3 months or sooner if needed. Patient and family instructed to notify our office if any recurrent CP, dyspnea, syncope/ near syncope.   Lyda Jester PA-C 11/18/2015 11:34 AM

## 2015-11-21 ENCOUNTER — Inpatient Hospital Stay (HOSPITAL_COMMUNITY): Admission: RE | Admit: 2015-11-21 | Payer: Medicare Other | Source: Ambulatory Visit

## 2015-11-25 ENCOUNTER — Ambulatory Visit (HOSPITAL_COMMUNITY)
Admission: RE | Admit: 2015-11-25 | Discharge: 2015-11-25 | Disposition: A | Discharge status: Home / Self Care | Payer: Medicare Other | Source: Ambulatory Visit | Attending: Cardiology | Admitting: Cardiology

## 2015-11-25 ENCOUNTER — Other Ambulatory Visit: Payer: Self-pay | Admitting: Cardiology

## 2015-11-25 DIAGNOSIS — I313 Pericardial effusion (noninflammatory): Secondary | ICD-10-CM | POA: Diagnosis present

## 2015-11-25 DIAGNOSIS — N186 End stage renal disease: Secondary | ICD-10-CM | POA: Diagnosis not present

## 2015-11-25 DIAGNOSIS — I132 Hypertensive heart and chronic kidney disease with heart failure and with stage 5 chronic kidney disease, or end stage renal disease: Secondary | ICD-10-CM | POA: Insufficient documentation

## 2015-11-25 DIAGNOSIS — I3139 Other pericardial effusion (noninflammatory): Secondary | ICD-10-CM

## 2015-11-25 DIAGNOSIS — I509 Heart failure, unspecified: Secondary | ICD-10-CM | POA: Insufficient documentation

## 2015-11-25 DIAGNOSIS — I319 Disease of pericardium, unspecified: Secondary | ICD-10-CM

## 2015-11-25 LAB — ECHOCARDIOGRAM LIMITED
AORTIC ROOT 2D: 39 mm
CHL CUP LA SIZE INDEX: 2.17 mm/m2
CHL CUP SV INDEX: 25.9 mL/m2
FS: 30 % (ref 28–44)
IVS/LV PW RATIO, ED: 0.62
LA VOL 2D INDEX: 31.3 mL/m2
LA VOL 2D: 52 mL
LA vol index: 35.6 mL/m2
LASIZE: 36 mm
LAVOL: 59 mL
LEFT ATRIUM END SYS DIAM: 36 mm
LV PW s: 17.5 mm
LV sys vol index: 15 mL/m2
LV sys vol: 68 mL — AB (ref 21–61)
LVDIAVOL: 68 mL (ref 62–150)
LVDIAVOLIN: 41 mL/m2
LVIDD: 47.8 mm — AB (ref 3.5–6.0)
LVIDS: 33.4 mm — AB (ref 2.1–4.0)
PW: 17.5 mm — AB (ref 0.6–1.1)
Simpson's disk: 63
Single Plane A4C EF: 63 %
Stroke v: 43 ml

## 2015-12-03 DIAGNOSIS — L940 Localized scleroderma [morphea]: Secondary | ICD-10-CM | POA: Insufficient documentation

## 2016-01-13 ENCOUNTER — Encounter: Payer: Self-pay | Admitting: Podiatry

## 2016-01-13 ENCOUNTER — Ambulatory Visit (INDEPENDENT_AMBULATORY_CARE_PROVIDER_SITE_OTHER): Payer: Medicare Other | Admitting: Podiatry

## 2016-01-13 DIAGNOSIS — B351 Tinea unguium: Secondary | ICD-10-CM

## 2016-01-13 DIAGNOSIS — M79675 Pain in left toe(s): Secondary | ICD-10-CM | POA: Diagnosis not present

## 2016-01-13 DIAGNOSIS — M79674 Pain in right toe(s): Secondary | ICD-10-CM | POA: Diagnosis not present

## 2016-01-13 NOTE — Progress Notes (Signed)
Patient ID: Travis [name], male   DOB: 1941-08-08, 75 y.o.   MRN: [id number]   Subjective: This patient presents today complaining of thickened and elongated toenails which are uncomfortable walking wearing shoes and request toenail debridement   Patient's  patient's son present to treatment room Patient is responsive to questioning appears orientated 3 Transfers from wheelchair to treatment table  Vascular: No peripheral edema noted bilaterally DP pulses 2/4 bilaterally PT pulses 0/4 bilaterally Capillary refill is immediate bilaterally  Neurological: Sensation to 10 g monofilament wire intact 5/5 bilaterally Vibratory sensation reactive right nonreactive left Ankle reflex reactive bilaterally  Dermatological: Dry skin bilaterally No open skin lesions bilaterally The toenails are brittle, hypertrophic, elongated, discolored and tender to direct palpation 6-10  Musculoskeletal: There is no restriction ankle, subtalar, midtarsal joints bilaterally No deformities noted bilaterally      Assessment & Plan:   Assessment: Decreased posterior tibial pulses bilaterally Symptomatic onychomycoses 6-10  Plan: . The toenails 6-10 are debrided mechanically an electrically without any Bleeding  Reappoint 3 months

## 2016-02-19 ENCOUNTER — Encounter: Payer: Self-pay | Admitting: Cardiology

## 2016-02-19 ENCOUNTER — Ambulatory Visit (INDEPENDENT_AMBULATORY_CARE_PROVIDER_SITE_OTHER): Payer: Medicare Other | Admitting: Cardiology

## 2016-02-19 VITALS — BP 122/62 | HR 80 | Ht 70.0 in | Wt 141.0 lb

## 2016-02-19 DIAGNOSIS — I951 Orthostatic hypotension: Secondary | ICD-10-CM | POA: Diagnosis not present

## 2016-02-19 DIAGNOSIS — E785 Hyperlipidemia, unspecified: Secondary | ICD-10-CM | POA: Diagnosis not present

## 2016-02-19 DIAGNOSIS — I313 Pericardial effusion (noninflammatory): Secondary | ICD-10-CM

## 2016-02-19 DIAGNOSIS — I319 Disease of pericardium, unspecified: Secondary | ICD-10-CM | POA: Diagnosis not present

## 2016-02-19 DIAGNOSIS — I3139 Other pericardial effusion (noninflammatory): Secondary | ICD-10-CM

## 2016-02-19 DIAGNOSIS — I10 Essential (primary) hypertension: Secondary | ICD-10-CM

## 2016-02-19 LAB — CBC WITH DIFFERENTIAL/PLATELET
Basophils Absolute: 84 cells/uL (ref 0–200)
Basophils Relative: 1 %
Eosinophils Absolute: 504 cells/uL — ABNORMAL HIGH (ref 15–500)
Eosinophils Relative: 6 %
HCT: 37.6 % — ABNORMAL LOW (ref 38.5–50.0)
Hemoglobin: 11.7 g/dL — ABNORMAL LOW (ref 13.2–17.1)
Lymphocytes Relative: 10 %
Lymphs Abs: 840 cells/uL — ABNORMAL LOW (ref 850–3900)
MCH: 27.9 pg (ref 27.0–33.0)
MCHC: 31.1 g/dL — ABNORMAL LOW (ref 32.0–36.0)
MCV: 89.7 fL (ref 80.0–100.0)
MPV: 10.3 fL (ref 7.5–12.5)
Monocytes Absolute: 2268 cells/uL — ABNORMAL HIGH (ref 200–950)
Monocytes Relative: 27 %
Neutro Abs: 4704 cells/uL (ref 1500–7800)
Neutrophils Relative %: 56 %
Platelets: 179 10*3/uL (ref 140–400)
RBC: 4.19 MIL/uL — ABNORMAL LOW (ref 4.20–5.80)
RDW: 17.7 % — ABNORMAL HIGH (ref 11.0–15.0)
WBC: 8.4 10*3/uL (ref 3.8–10.8)

## 2016-02-19 LAB — COMPREHENSIVE METABOLIC PANEL
ALT: 6 U/L — ABNORMAL LOW (ref 9–46)
AST: 11 U/L (ref 10–35)
Albumin: 3.5 g/dL — ABNORMAL LOW (ref 3.6–5.1)
Alkaline Phosphatase: 94 U/L (ref 40–115)
BUN: 26 mg/dL — ABNORMAL HIGH (ref 7–25)
CO2: 31 mmol/L (ref 20–31)
Calcium: 9.6 mg/dL (ref 8.6–10.3)
Chloride: 94 mmol/L — ABNORMAL LOW (ref 98–110)
Creat: 6.1 mg/dL — ABNORMAL HIGH (ref 0.70–1.18)
Glucose, Bld: 77 mg/dL (ref 65–99)
Potassium: 4.6 mmol/L (ref 3.5–5.3)
Sodium: 136 mmol/L (ref 135–146)
Total Bilirubin: 0.3 mg/dL (ref 0.2–1.2)
Total Protein: 8 g/dL (ref 6.1–8.1)

## 2016-02-19 MED ORDER — CARVEDILOL 3.125 MG PO TABS: 3.1250 mg | ORAL_TABLET | Freq: Two times a day (BID) | ORAL | Status: DC

## 2016-02-19 NOTE — Patient Instructions (Signed)
Medication Instructions:   DECREASE YOUR CARVEDILOL TO 3.125 MG TWICE DAILY    Labwork:  TODAY--CMET AND CBC W DIFF    Testing/Procedures:  Your physician has requested that you have an echocardiogram. Echocardiography is a painless test that uses sound waves to create images of your heart. It provides your doctor with information about the size and shape of your heart and how well your heart's chambers and valves are working. This procedure takes approximately one hour. There are no restrictions for this procedure.  PER DR NELSON HIS ECHO SHOULD BE SCHEDULED A WEEK OR TWO PRIOR TOO HIS 6 MONTH FOLLOW-UP APPOINTMENT WITH HER    Follow-Up:  Your physician wants you to follow-up in: 6 MONTHS WITH DR Johann Capers will receive a reminder letter in the mail two months in advance. If you don't receive a letter, please call our office to schedule the follow-up appointment.   PER DR NELSON HIS ECHO SHOULD BE SCHEDULED A WEEK OR TWO PRIOR TOO HIS 6 MONTH FOLLOW-UP APPOINTMENT WITH HER      If you need a refill on your cardiac medications before your next appointment, please call your pharmacy.

## 2016-02-19 NOTE — Progress Notes (Signed)
02/19/2016 [name]   06/24/1941  [id number]  Primary Physician [name], MD Primary Cardiologist: Dr. Mare Ferrari Dr. Meda Coffee   Reason for Visit/CC: [facility]; Pericardial Effusion  HPI:  Mr. 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 He recently presented to [facility] with complaint of chest discomfort, weakness and fever. IM treated for sepsis.  Chest CT suggested the presence of a pericardial effusion. Subsequent TTE confirmed a large pericardial effusion without tamponade physiology. EF was normal at 55-60%.He was seen by our team at [facility] and was was felt to be stable. There was no acute reason for drainage. Supportive management of effusion was recommended.He was treated with colchicine.At time of discharge, it was recommended that he undergo a 1 week f/u TEE to assess for interval change.   02/19/2016  - 2 months follow-up, he is accompanied by both his wife and his grandson, he reports that he has done well since discharge, he has no chest pain no shortness of breath nor to dyspnea person nocturnal dyspnea no lower extremity edema. He feels that he has pain in his feet ever since he was discharged from the hospital. He has finished taking cautious seen. He feels lightheaded when he first ends up in the morning but otherwise no dizziness and no syncope. He would like to come off some of the medications he is taking.  Current Outpatient Prescriptions  Medication Sig Dispense Refill  . aspirin EC 81 MG EC tablet Take 1 tablet (81 mg total) by mouth daily. 30 tablet 0  . atorvastatin (LIPITOR) 40 MG tablet Take 1 tablet (40 mg total) by  mouth daily at 6 PM. 30 tablet 0  . carvedilol (COREG) 6.25 MG tablet Take 3.125 mg by mouth 2 (two) times daily with a meal.    . cyanocobalamin 500 MCG tablet Take 1 tablet (500 mcg total) by mouth daily. 30 tablet 0  . ferrous sulfate 325 (65 FE) MG tablet Take 1 tablet (325 mg total) by mouth 2 (two) times daily with a meal. 60 tablet 0  . hydrOXYzine (VISTARIL) 25 MG capsule Take 25 mg by mouth at bedtime as needed (sleep. May repeat 2 hours later if necessary).    Marland Kitchen omeprazole (PRILOSEC) 20 MG capsule TAKE 1 CAPSULE (20 MG TOTAL) BY MOUTH DAILY.  11  . sevelamer carbonate (RENVELA) 800 MG tablet Take 2 tablets (1,600 mg total) by mouth 3 (three) times daily with meals. 90 tablet 0   No current facility-administered medications for this visit.    No Known Allergies  Social History   Social History  . Marital Status: Married    Spouse Name: N/A  . Number of Children: N/A  . Years of Education: N/A   Occupational History  . Not on file.   Social History Main Topics  . Smoking status: Never Smoker   . Smokeless tobacco: Never Used  . Alcohol Use: No  . Drug Use: No  . Sexual Activity: Not on file   Other Topics Concern  . Not on file   Social History Narrative     Review of Systems: General: negative for chills, fever, night sweats or weight changes.  Cardiovascular: negative  for chest pain, dyspnea on exertion, edema, orthopnea, palpitations, paroxysmal nocturnal dyspnea or shortness of breath Dermatological: negative for rash Respiratory: negative for cough or wheezing Urologic: negative for hematuria Abdominal: negative for nausea, vomiting, diarrhea, bright red blood per rectum, melena, or hematemesis Neurologic: negative for visual changes, syncope, or dizziness All other systems reviewed and are otherwise negative except as noted above.  Blood pressure 122/62, pulse 80, height 5\' 10"  (1.778 m), weight 141 lb (63.957 kg).  General appearance: alert, cooperative,  no distress and frail appearing, in wheel chair Neck: no carotid bruit and no JVD Lungs: clear to auscultation bilaterally Heart: regular rate and rhythm, S1, S2 normal, no murmur, click, rub or gallop Extremities: no LEE Pulses: 2+ and symmetric Skin: warm and dry Neurologic: Grossly normal  EKG NSR. Normal QRS complex appearance  TTE: 11/25/2015 Study Conclusions  - Left ventricle: The cavity size was normal. There was moderate  concentric hypertrophy. Systolic function was normal. The  estimated ejection fraction was in the range of 60% to 65%. Wall  motion was normal; there were no regional wall motion  abnormalities. The study is not technically sufficient to allow  evaluation of LV diastolic function. - Left atrium: The atrium was mildly dilated. - Inferior vena cava: The vessel was normal in size. The  respirophasic diameter changes were in the normal range (>= 50%),  consistent with normal central venous pressure. - Pericardium, extracardiac: There was no pericardial effusion.  Impressions: - Compared to a prior study in 10/2015, the pericardial effusion has  resolved.    ASSESSMENT AND PLAN:   1. Pericardial Effusion: In the settings of sepsis, he also has Sjogren's disease, he was treated with colchicine, repeat echocardiogram in March showed no residual pericardial effusion. He is symptoms and physical exam suggest that he stable and doesn't have any pericardial effusion right now.  We will follow-up in 6 months with repeat echocardiogram. He's advised on symptoms of her coronary fusion and advised to call us if he experiences dose.   2. Orthostatic hypotension, will decrease the dose of carvedilol to 3.125 by mouth twice a day.   3. Hyperlipidemia on atorvastatin 40 mg daily however we have no labs here, he ate already today so we will repeat prior to the next visit.   4. Lower extremity pain, possibly neuropathy, he is advised to follow with his  neurologist.  Follow-up in 6 months with repeat echocardiogram to reevaluate his effusion prior to that visit. We will check his CBC and CMP today and potentially discontinue iron pills.   Ena Dawley PA-C 02/19/2016 9:48 AM

## 2016-02-20 ENCOUNTER — Telehealth: Payer: Self-pay | Admitting: *Deleted

## 2016-02-20 NOTE — Telephone Encounter (Signed)
-----   Message from Dorothy Spark, MD sent at 02/19/2016 11:45 PM EDT ----- His anemia has significantly improved from Hb 9.7 to 11.7, he can discontinue iron supplements

## 2016-02-20 NOTE — Telephone Encounter (Signed)
Notified the pt that per Dr Meda Coffee, his labs showed that his anemia has significantly improved from Hb 9.7 to 11.7.  Informed the pt that per Dr Meda Coffee, he can discontinue iron supplements.  Pt verbalized understanding and agrees with this plan.

## 2016-04-13 ENCOUNTER — Ambulatory Visit: Payer: Medicare Other | Admitting: Podiatry

## 2016-05-04 ENCOUNTER — Ambulatory Visit: Payer: Medicare Other | Admitting: Podiatry

## 2016-06-22 ENCOUNTER — Ambulatory Visit: Payer: Medicare Other | Admitting: Podiatry

## 2016-06-22 DIAGNOSIS — M792 Neuralgia and neuritis, unspecified: Secondary | ICD-10-CM | POA: Insufficient documentation

## 2016-06-29 ENCOUNTER — Encounter: Payer: Self-pay | Admitting: Podiatry

## 2016-06-29 ENCOUNTER — Ambulatory Visit (INDEPENDENT_AMBULATORY_CARE_PROVIDER_SITE_OTHER): Payer: Medicare Other | Admitting: Podiatry

## 2016-06-29 VITALS — BP 158/89 | HR 71 | Resp 14

## 2016-06-29 DIAGNOSIS — M79674 Pain in right toe(s): Secondary | ICD-10-CM

## 2016-06-29 DIAGNOSIS — M79675 Pain in left toe(s): Secondary | ICD-10-CM

## 2016-06-29 DIAGNOSIS — B351 Tinea unguium: Secondary | ICD-10-CM

## 2016-06-29 NOTE — Progress Notes (Signed)
Patient ID: Travis [name], male   DOB: 1940-12-15, 75 y.o.   MRN: [id number]  Subjective: This patient presents today complaining of thickened and elongated toenails which are uncomfortable walking wearing shoes and request toenail debridement   Patient's  patient's wife is present to treatment room Patient is responsive to questioning appears orientated 3 Transfers from wheelchair to treatment table  Vascular: No peripheral edema noted bilaterally DP pulses 2/4 bilaterally PT pulses 0/4 bilaterally Capillary refill is immediate bilaterally  Neurological: Sensation to 10 g monofilament wire intact 5/5 bilaterally Vibratory sensation reactive right nonreactive left Ankle reflex reactive bilaterally  Dermatological: Dry skin bilaterally No open skin lesions bilaterally The toenails are brittle, hypertrophic, elongated, discolored and tender to direct palpation 6-10  Musculoskeletal: There is no restriction ankle, subtalar, midtarsal joints bilaterally No deformities noted bilaterally     Assessment: Decreased posterior tibial pulses bilaterally Symptomatic onychomycoses 6-10  Plan: . The toenails 6-10 are debrided mechanically an electrically without any Bleeding  Reappoint 3 months

## 2016-08-10 IMAGING — US US BIOPSY
1 series · 9 of 9 positions shown · non-contrast
Comparison: none

CLINICAL DATA: Acute renal failure and glomerulonephritis. The
patient requires renal biopsy.

[Series 1: us biopsy · 0.18mm/px · 9 of 9 slices shown]
[im 1/9]
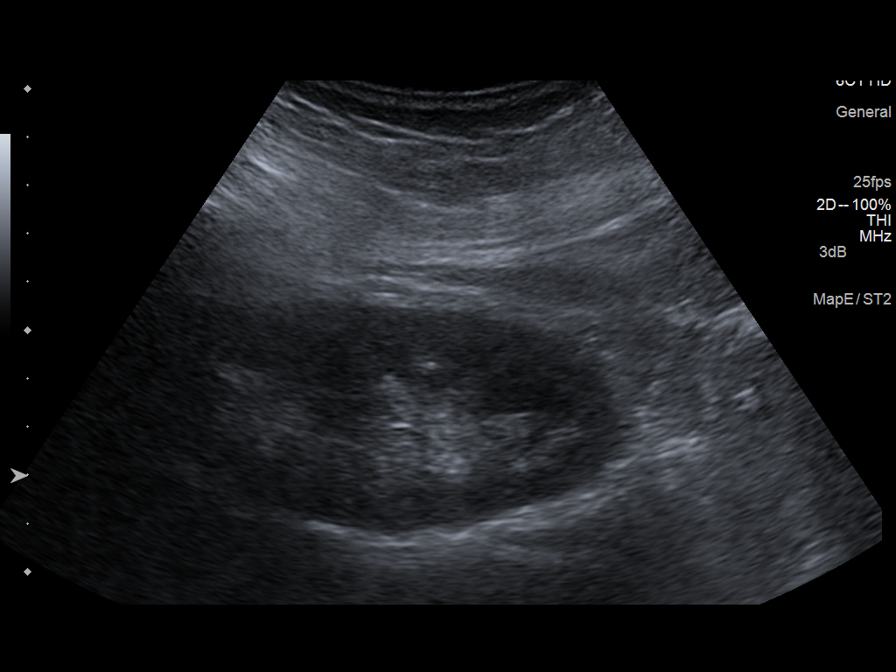
[im 2/9]
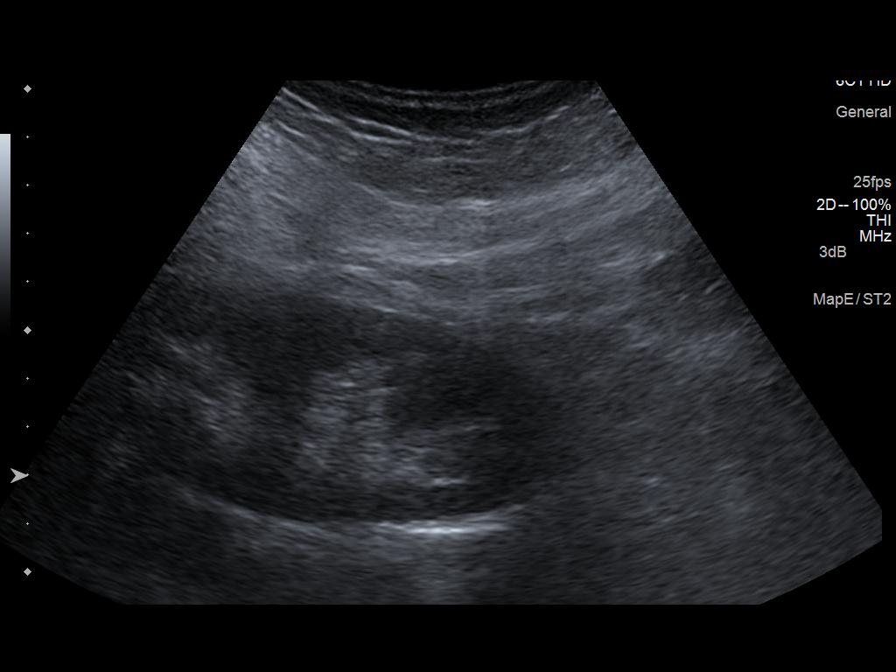
[im 3/9]
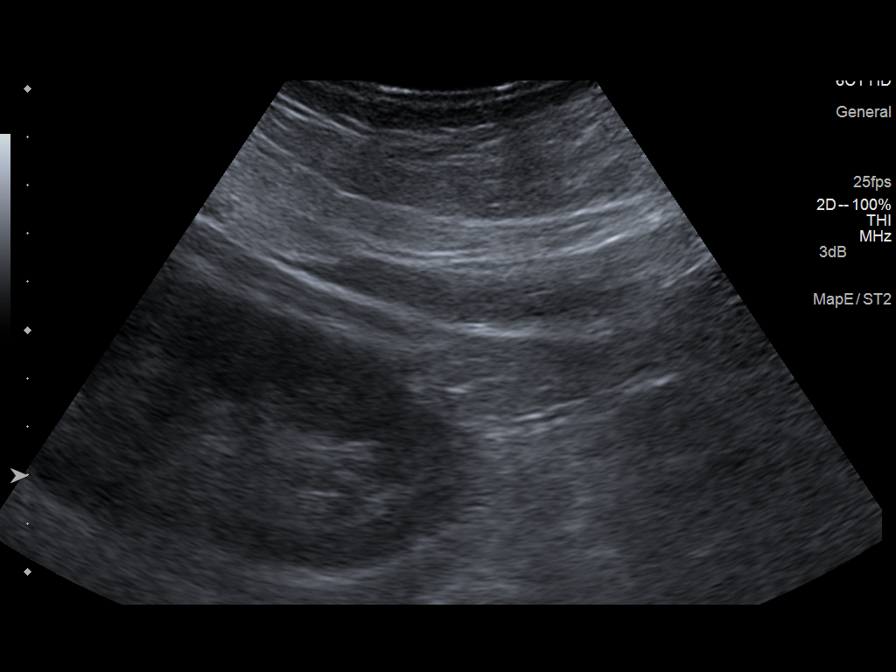
[im 4/9]
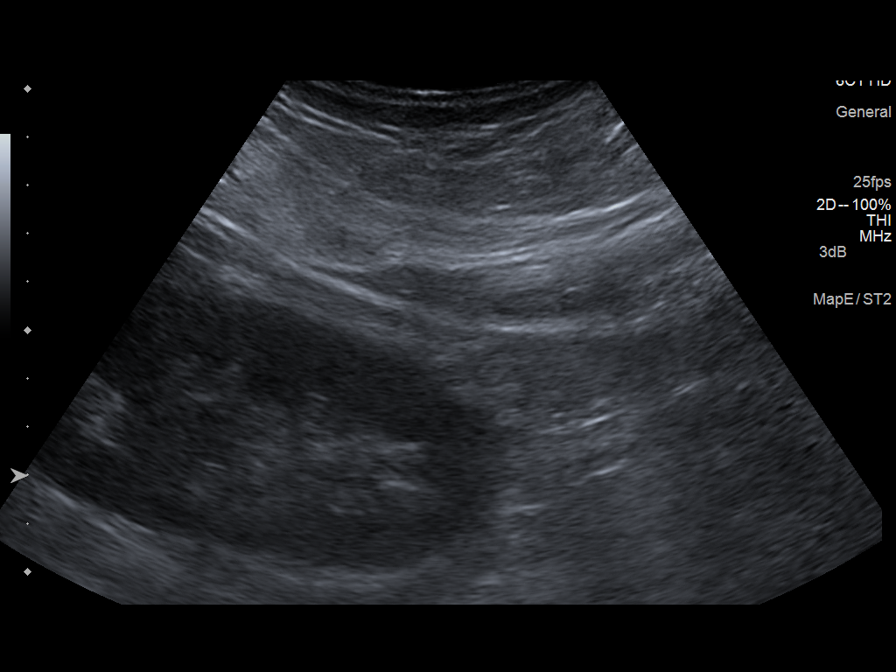
[im 5/9]
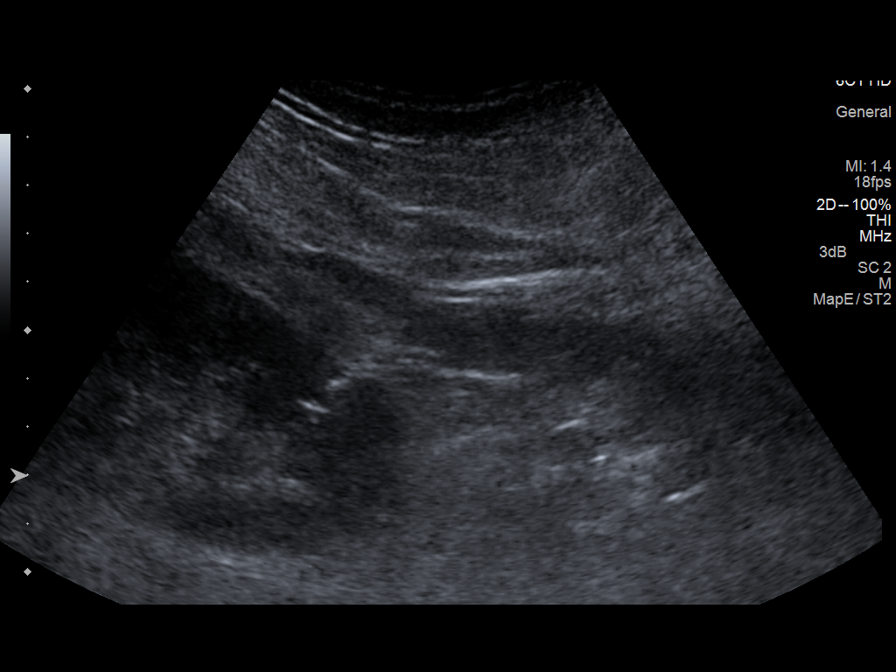
[im 6/9]
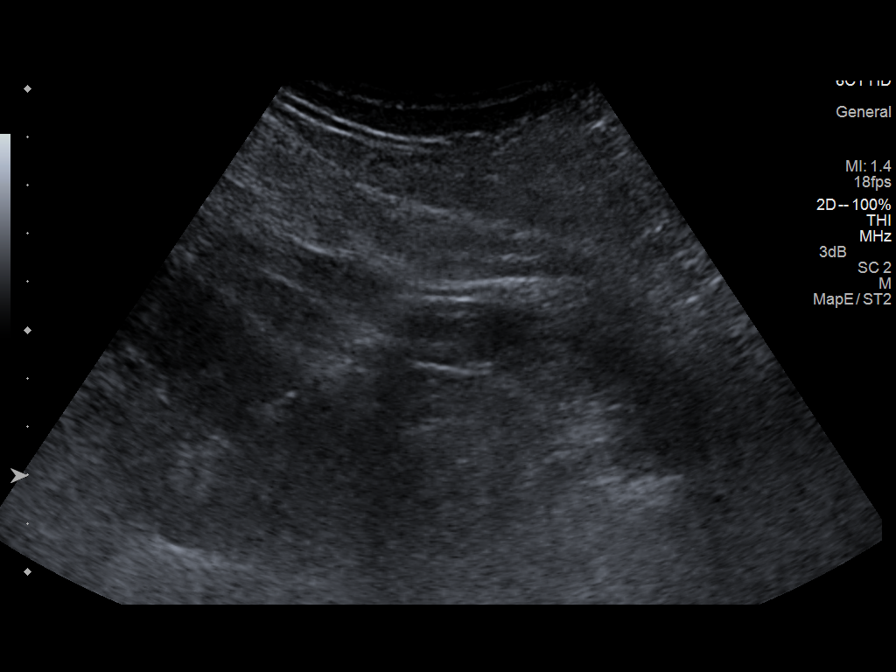
[im 7/9]
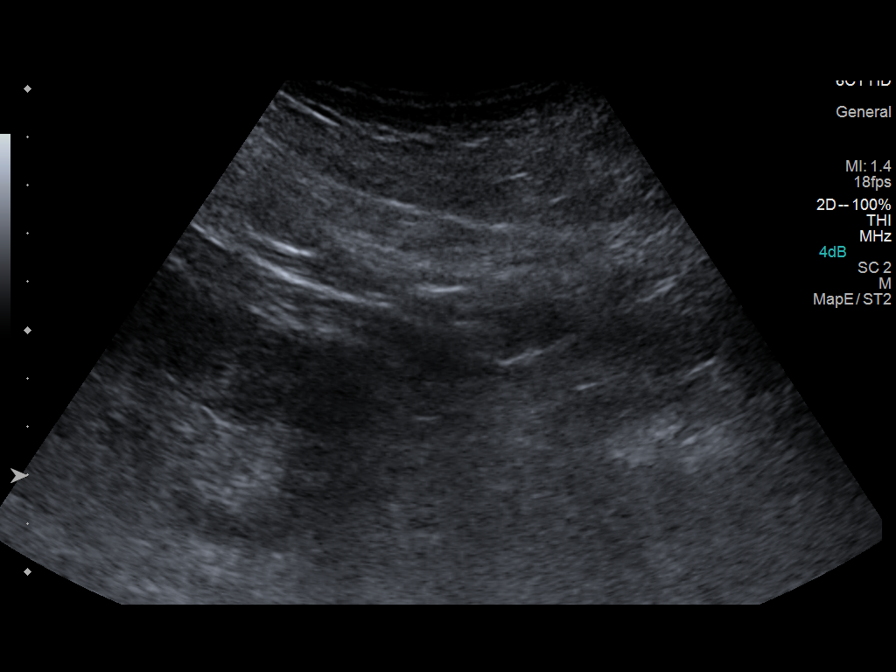
[im 8/9]
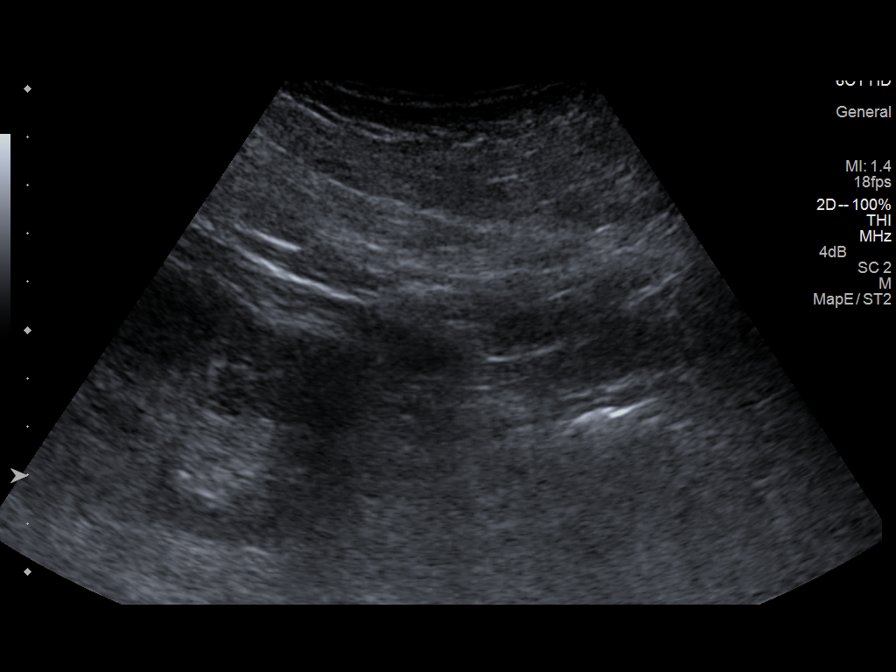
[im 9/9]
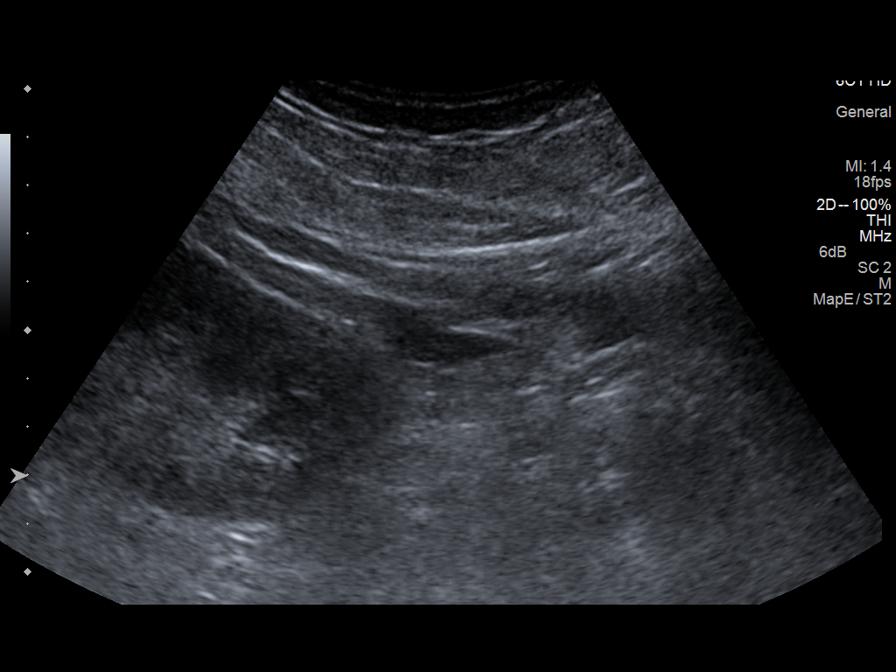

[9 of 9 positions shown; findings below may reference images not displayed]

EXAM:
ULTRASOUND GUIDED CORE BIOPSY OF LEFT KIDNEY

MEDICATIONS:
1.0 mg IV Versed; 50 mcg IV Fentanyl

Total Moderate Sedation Time: 15 minutes

PROCEDURE:
The procedure, risks, benefits, and alternatives were explained to
the patient. Questions regarding the procedure were encouraged and
answered. The patient understands and consents to the procedure. A
time-out was performed prior to the procedure.

The left flank region was prepped with Betadine in a sterile
fashion, and a sterile drape was applied covering the operative
field. A sterile gown and sterile gloves were used for the
procedure. Local anesthesia was provided with 1% Lidocaine.

Ultrasound was performed of both kidneys. The left kidney was chosen
for biopsy. Under ultrasound guidance, 2 separate core biopsy
samples were obtained through lower pole cortex utilizing a 16 gauge
needle biopsy device. Postprocedural ultrasound was performed.

COMPLICATIONS:
Small amount of perinephric hemorrhage. SIR level A: No therapy, no
consequence.
FINDINGS: Both kidneys were well visualized by ultrasound. Percutaneous window
to renal cortex was slightly better on the left compared to the
right. Two solid core biopsy samples were obtained. Post biopsy
imaging shows a small amount of perinephric hemorrhage adjacent to
the lower pole of the left kidney. This did not visibly enlarge over
several minutes of sonographic surveillance.
IMPRESSION: Ultrasound-guided core biopsy performed of the lower pole of the
left kidney. The procedure was complicated by a small amount of
focal perinephric hemorrhage adjacent to the lower pole.

## 2016-08-12 IMAGING — CT CT ABD-PELV W/O CM
2 of 4 series · 16 of 46 positions shown, 18 images · non-contrast
Comparison: Chest CT 12/19/2014 and chest CT 01/02/2015

CLINICAL DATA: Left-sided abdominal pain. Left renal biopsy
yesterday. Increased pain today with low hemoglobin.

EXAM:
CT ABDOMEN AND PELVIS WITHOUT CONTRAST
TECHNIQUE: Multidetector CT imaging of the abdomen and pelvis was performed
following the standard protocol without IV contrast.

[Series 3: abd/ pelvis 5.0 i30f 1 · axial · 0.98mm/px · z∈[+962,+1402]mm · 13 of 97 slices shown, 15 images]
[im 5/97  soft-tissue]
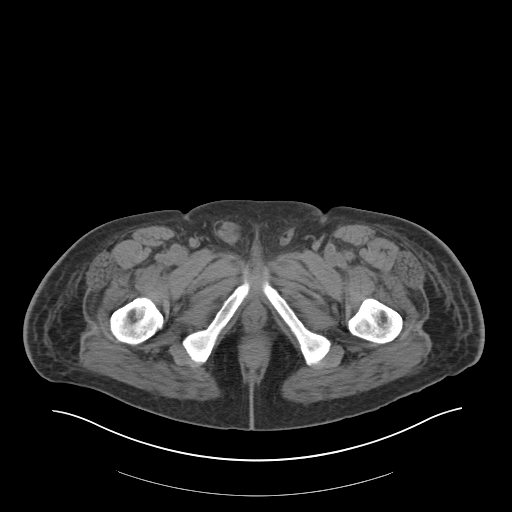
[im 5/97  bone]
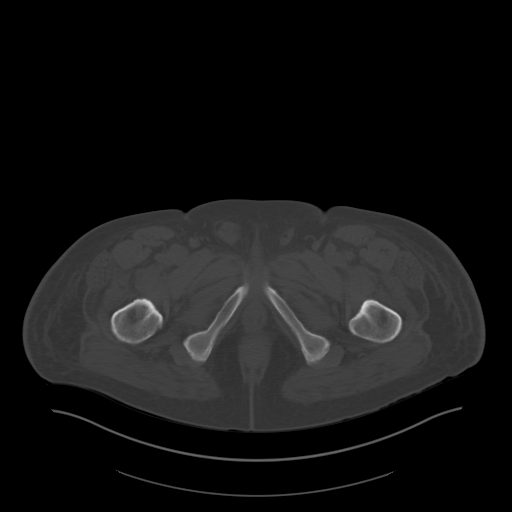
[im 13/97  soft-tissue]
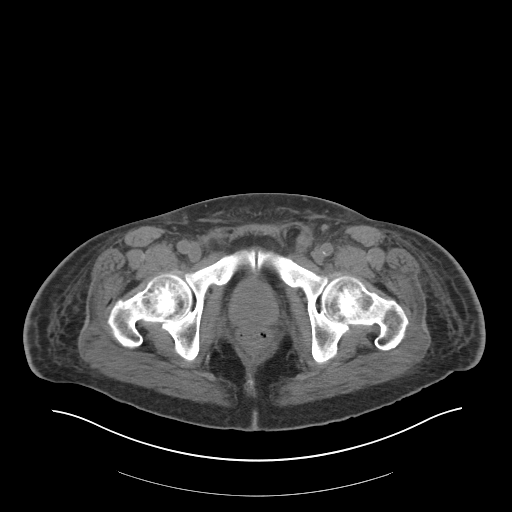
[im 21/97  soft-tissue]
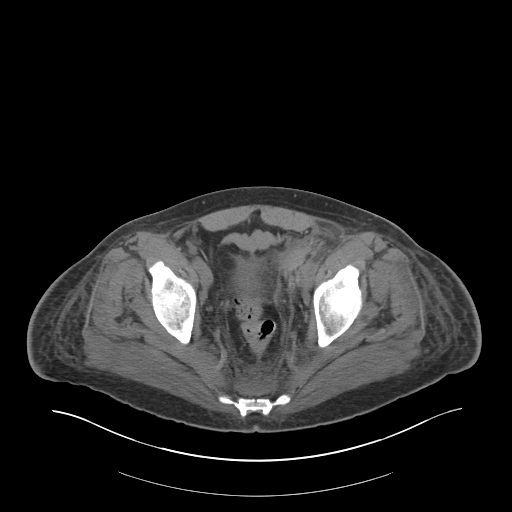
[im 29/97  soft-tissue]
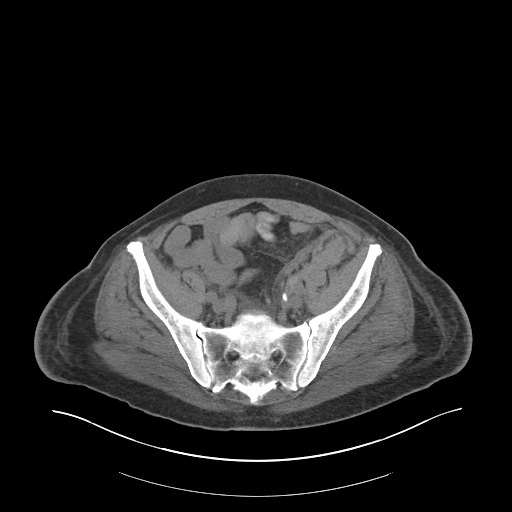
[im 33/97  soft-tissue]
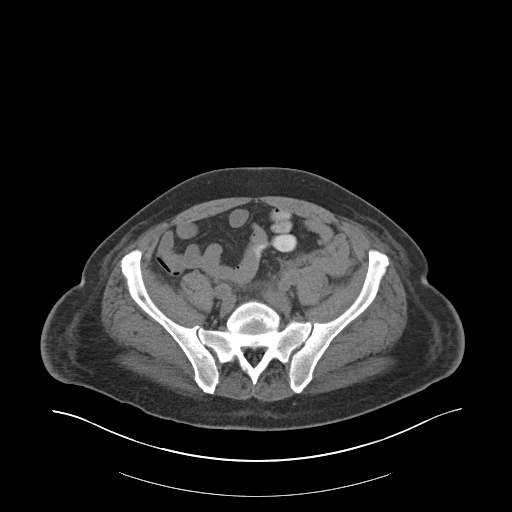
[im 41/97  soft-tissue]
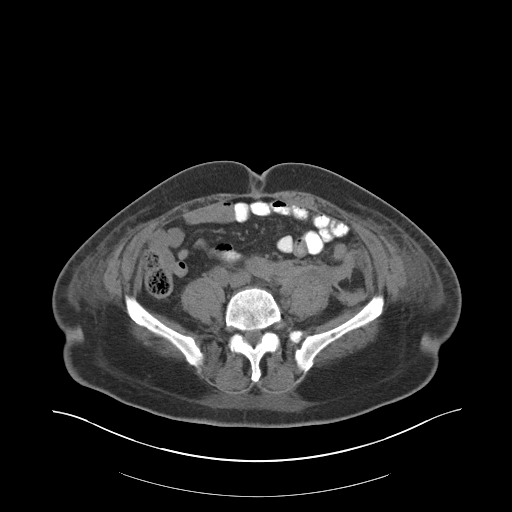
[im 49/97  soft-tissue]
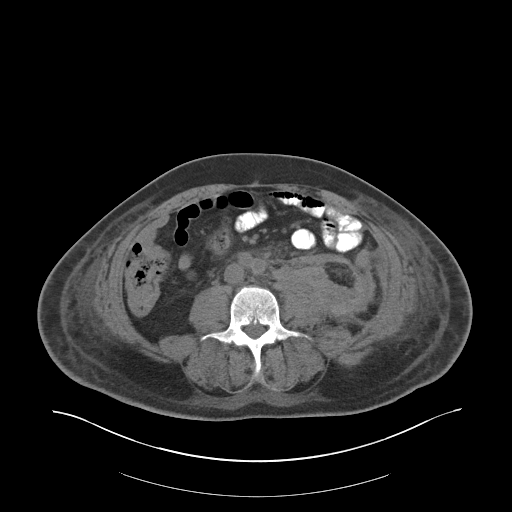
[im 57/97  soft-tissue]
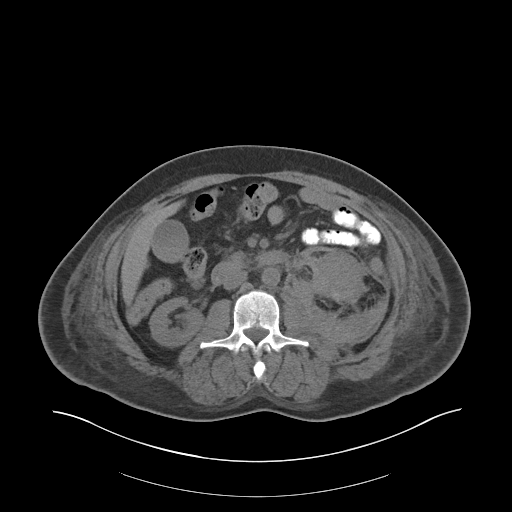
[im 65/97  soft-tissue]
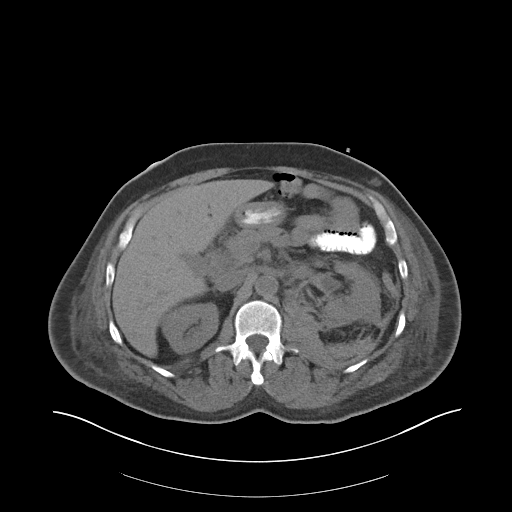
[im 65/97  bone]
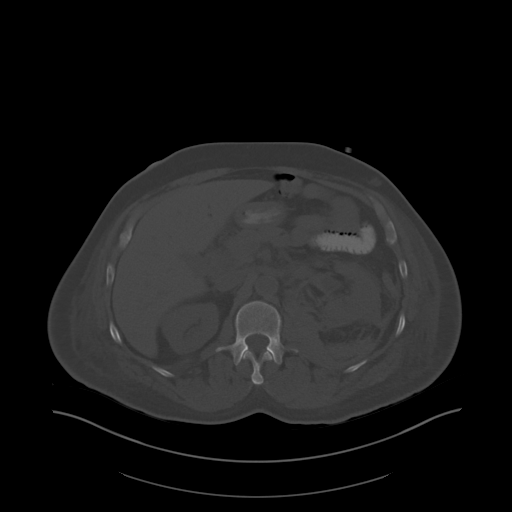
[im 69/97  soft-tissue]
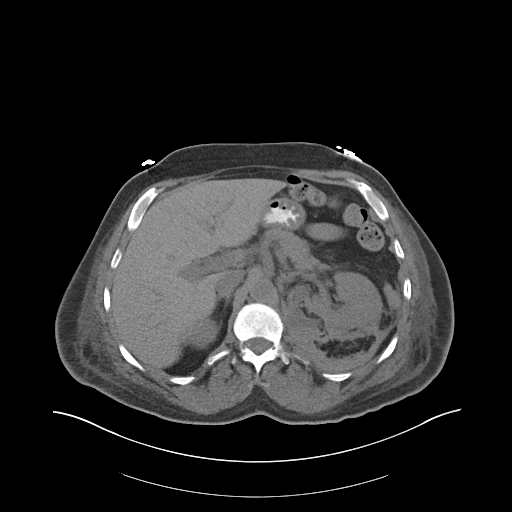
[im 77/97  soft-tissue]
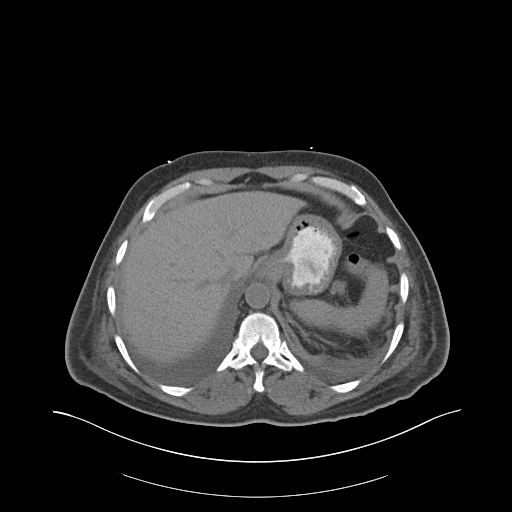
[im 85/97  soft-tissue]
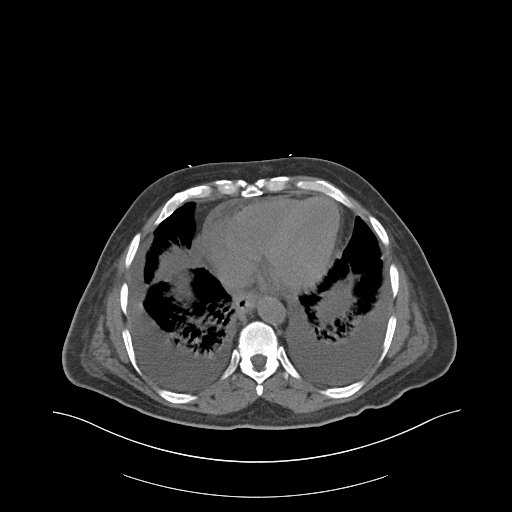
[im 93/97  soft-tissue]
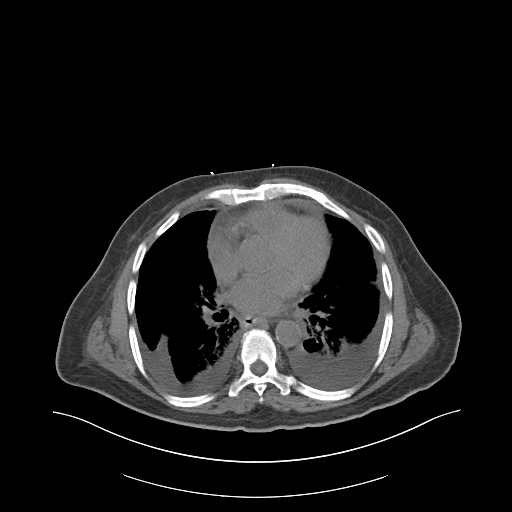

[Series 6: coronals · coronal · 0.86mm/px · 3 of 138 slices shown]
[im 46/138  soft-tissue]
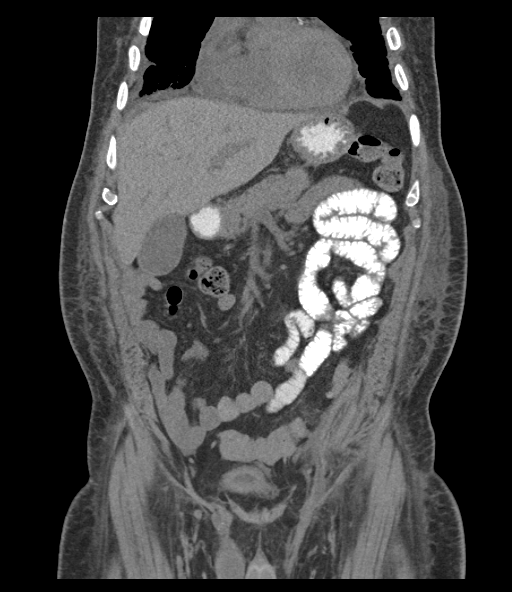
[im 61/138  soft-tissue]
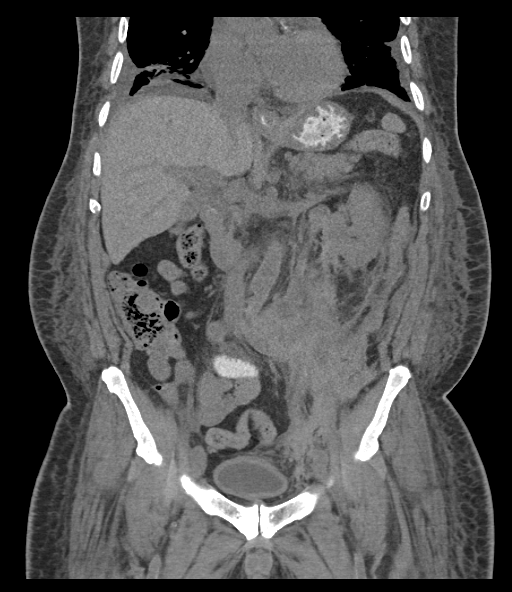
[im 77/138  soft-tissue]
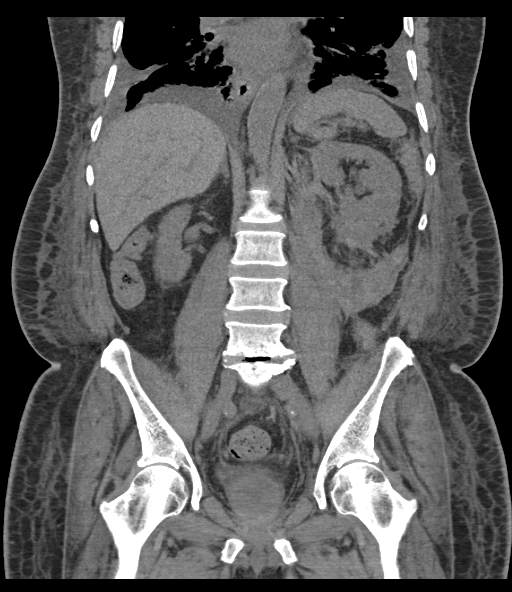

[16 of 46 positions shown; findings below may reference images not displayed]

FINDINGS: Lung bases demonstrate small to moderate bilateral pleural effusions
which are worse with associated bibasilar airspace opacification
without significant change which may be due to pneumonia versus
atelectasis. Small amount of pericardial fluid unchanged.

Abdominal images demonstrate the left kidney to be normal in size
without hydronephrosis, nephrolithiasis or focal mass. There is
moderate mixed attenuation left perinephric hemorrhage from
patient's recent percutaneous biopsy. Hemorrhage is worse over the
posterior perinephric space which extends inferiorly below the
kidney abutting the psoas muscle extending to the level of the left
pelvis just above the inguinal region. This hemorrhage displaces the
kidney slightly anteriorly.

The liver, spleen, pancreas, adrenal glands and right kidney are
within normal. There is mild layering material within the
gallbladder which may represent sludge versus non calcified stones.
Appendix is normal. There is diverticulosis of the colon.

Pelvic images demonstrate the bladder, prostate and rectum to be
within normal. There is fluid in the presacral space. There is
spondylosis of the lumbar spine with disc disease at the L4-5 level.
IMPRESSION: Evidence of moderate left perinephric hemorrhage post percutaneous
biopsy yesterday. Hemorrhage is worse over the posterior perinephric
space with mild displacement of the kidney anteriorly. Hemorrhage
extends inferiorly to the level of the left pelvis just above the
inguinal region. No hydronephrosis.

Worsening moderate size bilateral pleural effusions with associated
airspace disease in the lower lungs likely pneumonia and less likely
atelectasis.

Small amount of pericardial fluid unchanged.

Sludge versus noncalcified gallstones.

Mild diverticulosis of the colon.

## 2016-08-24 ENCOUNTER — Ambulatory Visit (HOSPITAL_COMMUNITY): Payer: Medicare Other | Attending: Cardiology

## 2016-08-24 ENCOUNTER — Other Ambulatory Visit: Payer: Self-pay

## 2016-08-24 DIAGNOSIS — Z992 Dependence on renal dialysis: Secondary | ICD-10-CM | POA: Diagnosis not present

## 2016-08-24 DIAGNOSIS — I509 Heart failure, unspecified: Secondary | ICD-10-CM | POA: Insufficient documentation

## 2016-08-24 DIAGNOSIS — N186 End stage renal disease: Secondary | ICD-10-CM | POA: Diagnosis not present

## 2016-08-24 DIAGNOSIS — I132 Hypertensive heart and chronic kidney disease with heart failure and with stage 5 chronic kidney disease, or end stage renal disease: Secondary | ICD-10-CM | POA: Diagnosis not present

## 2016-08-24 DIAGNOSIS — I3139 Other pericardial effusion (noninflammatory): Secondary | ICD-10-CM

## 2016-08-24 DIAGNOSIS — I313 Pericardial effusion (noninflammatory): Secondary | ICD-10-CM | POA: Diagnosis not present

## 2016-08-24 DIAGNOSIS — J8410 Pulmonary fibrosis, unspecified: Secondary | ICD-10-CM | POA: Insufficient documentation

## 2016-08-24 DIAGNOSIS — Z86718 Personal history of other venous thrombosis and embolism: Secondary | ICD-10-CM | POA: Diagnosis not present

## 2016-08-24 DIAGNOSIS — M3500 Sicca syndrome, unspecified: Secondary | ICD-10-CM | POA: Diagnosis not present

## 2016-08-25 ENCOUNTER — Encounter: Payer: Self-pay | Admitting: Cardiology

## 2016-09-02 ENCOUNTER — Ambulatory Visit (INDEPENDENT_AMBULATORY_CARE_PROVIDER_SITE_OTHER): Payer: Medicare Other | Admitting: Cardiology

## 2016-09-02 ENCOUNTER — Encounter: Payer: Self-pay | Admitting: Cardiology

## 2016-09-02 VITALS — BP 126/62 | HR 80 | Ht 70.0 in | Wt 146.0 lb

## 2016-09-02 DIAGNOSIS — E782 Mixed hyperlipidemia: Secondary | ICD-10-CM | POA: Diagnosis not present

## 2016-09-02 DIAGNOSIS — I309 Acute pericarditis, unspecified: Secondary | ICD-10-CM

## 2016-09-02 DIAGNOSIS — I3139 Other pericardial effusion (noninflammatory): Secondary | ICD-10-CM

## 2016-09-02 DIAGNOSIS — I313 Pericardial effusion (noninflammatory): Secondary | ICD-10-CM | POA: Diagnosis not present

## 2016-09-02 DIAGNOSIS — I951 Orthostatic hypotension: Secondary | ICD-10-CM

## 2016-09-02 DIAGNOSIS — E7849 Other hyperlipidemia: Secondary | ICD-10-CM

## 2016-09-02 DIAGNOSIS — E784 Other hyperlipidemia: Secondary | ICD-10-CM

## 2016-09-02 NOTE — Progress Notes (Signed)
09/02/2016 Travis [name]   [birth year]  [id number]  Primary Physician [name], MD Primary Cardiologist: Dr. Mare Ferrari Dr. 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 He recently presented to [facility] with complaint of chest discomfort, weakness and fever. IM treated for sepsis.  Chest CT suggested the presence of a pericardial effusion. Subsequent TTE confirmed a large pericardial effusion without tamponade physiology. EF was normal at 55-60%.He was seen by our team at [facility] and was was felt to be stable. There was no acute reason for drainage. Supportive management of effusion was recommended.He was treated with colchicine.At time of discharge, it was recommended that he undergo a 1 week f/u TEE to assess for interval change.   09/02/2016 - 6 months follow up, he has been doing well, denies any chest pain, DOE, no palpitations or syncope. He has been compliant with his meds and has no side effects with atorvastatin. He has been started on HD in July and is considering being worked up for a kidney transplant.  Current Outpatient Prescriptions  Medication Sig Dispense Refill  . aspirin EC 81 MG EC tablet Take 1 tablet (81 mg total) by mouth daily. 30 tablet 0  . atorvastatin (LIPITOR) 40 MG tablet Take 1 tablet (40 mg total) by mouth daily at 6 PM. 30 tablet 0  . carvedilol (COREG) 3.125 MG tablet Take 1 tablet (3.125 mg total) by mouth 2 (two) times daily. 180 tablet 3  . cyanocobalamin 500 MCG tablet Take 1 tablet (500 mcg total) by mouth daily. 30 tablet 0  .  gabapentin (NEURONTIN) 100 MG capsule Take 100 mg by mouth at bedtime.    . hydroxychloroquine (PLAQUENIL) 200 MG tablet Take 400 mg by mouth daily.    Marland Kitchen omeprazole (PRILOSEC) 20 MG capsule TAKE 1 CAPSULE (20 MG TOTAL) BY MOUTH DAILY.  11  . sevelamer carbonate (RENVELA) 800 MG tablet Take 2 tablets (1,600 mg total) by mouth 3 (three) times daily with meals. 90 tablet 0   No current facility-administered medications for this visit.     No Known Allergies  Social History   Social History  . Marital status: Married    Spouse name: N/A  . Number of children: N/A  . Years of education: N/A   Occupational History  . Not on file.   Social History Main Topics  . Smoking status: Never Smoker  . Smokeless tobacco: Never Used  . Alcohol use No  . Drug use: No  . Sexual activity: Not on file   Other Topics Concern  . Not on file   Social History Narrative  . No narrative on file     Review of Systems: General: negative for chills, fever, night sweats or weight changes.  Cardiovascular: negative for chest pain, dyspnea on exertion, edema, orthopnea, palpitations, paroxysmal nocturnal dyspnea or shortness of breath Dermatological: negative for rash Respiratory: negative for cough or wheezing Urologic: negative for hematuria Abdominal: negative for nausea, vomiting, diarrhea, bright red blood per rectum, melena, or hematemesis Neurologic: negative for visual changes, syncope, or dizziness All other systems reviewed and are otherwise negative except  as noted above.  Blood pressure 126/62, pulse 80, height 5\' 10"  (1.778 m), weight 146 lb (66.2 kg).  General appearance: alert, cooperative, no distress and frail appearing, in wheel chair Neck: no carotid bruit and no JVD Lungs: clear to auscultation bilaterally Heart: regular rate and rhythm, S1, S2 normal, no murmur, click, rub or gallop Extremities: no LEE Pulses: 2+ and symmetric Skin: warm and dry Neurologic: Grossly  normal  EKG NSR. Normal QRS complex appearance  TTE: 11/25/2015 Study Conclusions  - Left ventricle: The cavity size was normal. There was moderate  concentric hypertrophy. Systolic function was normal. The  estimated ejection fraction was in the range of 60% to 65%. Wall  motion was normal; there were no regional wall motion  abnormalities. The study is not technically sufficient to allow  evaluation of LV diastolic function. - Left atrium: The atrium was mildly dilated. - Inferior vena cava: The vessel was normal in size. The  respirophasic diameter changes were in the normal range (>= 50%),  consistent with normal central venous pressure. - Pericardium, extracardiac: There was no pericardial effusion.  Impressions: - Compared to a prior study in 10/2015, the pericardial effusion has  resolved.  ECG: SR, LVH, no specific ST-T wave abnormalities.    ASSESSMENT AND PLAN:   1. Pericardial Effusion: In the settings of sepsis, he also has Sjogren's disease, he was treated with colchicine, repeat echocardiogram in March showed no residual pericardial effusion. He has no recurrent symptoms.  2. Orthostatic hypotension, resolved with decreased dose of carvedilol  3. Hyperlipidemia on atorvastatin 40 mg daily however we have no labs here, he ate already today so we will repeat the next week.  4. ESRD on HD - encouraged to consider a kidney transplant  Follow up in 6 months.   Ena Dawley , MD 09/02/2016 9:52 AM

## 2016-09-02 NOTE — Patient Instructions (Addendum)
Medication Instructions:   DR Meda Coffee WANTS YOUR TO HOLD YOUR MORNING DOSE OF CARVEDILOL (COREG) ONLY ON THE DAYS YOU HAVE DIALYSIS    Labwork:  NEXT WEEK Tuesday 09/07/16 AT OUR OFFICE TO CHECK---CMET AND FASTING LIPIDS---PLEASE COME FASTING TO THIS LAB APPOINTMENT    Follow-Up:  Your physician wants you to follow-up in: [place] will receive a reminder letter in the mail two months in advance. If you don't receive a letter, please call our office to schedule the follow-up appointment.        If you need a refill on your cardiac medications before your next appointment, please call your pharmacy.

## 2016-09-07 ENCOUNTER — Other Ambulatory Visit: Payer: Medicare Other

## 2016-09-09 ENCOUNTER — Other Ambulatory Visit: Payer: Medicare Other

## 2016-09-09 DIAGNOSIS — I309 Acute pericarditis, unspecified: Secondary | ICD-10-CM

## 2016-09-09 DIAGNOSIS — E7849 Other hyperlipidemia: Secondary | ICD-10-CM

## 2016-09-10 ENCOUNTER — Telehealth: Payer: Self-pay | Admitting: Cardiology

## 2016-09-10 LAB — COMPREHENSIVE METABOLIC PANEL
ALT: 7 U/L — ABNORMAL LOW (ref 9–46)
AST: 11 U/L (ref 10–35)
Albumin: 4.2 g/dL (ref 3.6–5.1)
Alkaline Phosphatase: 136 U/L — ABNORMAL HIGH (ref 40–115)
BUN: 43 mg/dL — ABNORMAL HIGH (ref 7–25)
CO2: 28 mmol/L (ref 20–31)
Calcium: 9.4 mg/dL (ref 8.6–10.3)
Chloride: 94 mmol/L — ABNORMAL LOW (ref 98–110)
Creat: 8.33 mg/dL — ABNORMAL HIGH (ref 0.70–1.18)
Glucose, Bld: 90 mg/dL (ref 65–99)
Potassium: 4.6 mmol/L (ref 3.5–5.3)
Sodium: 135 mmol/L (ref 135–146)
Total Bilirubin: 0.4 mg/dL (ref 0.2–1.2)
Total Protein: 8.3 g/dL — ABNORMAL HIGH (ref 6.1–8.1)

## 2016-09-10 LAB — LIPID PANEL
Cholesterol: 117 mg/dL (ref <200)
HDL: 43 mg/dL (ref >40)
LDL Cholesterol: 36 mg/dL (ref <100)
Total CHOL/HDL Ratio: 2.7 Ratio (ref <5.0)
Triglycerides: 190 mg/dL — ABNORMAL HIGH (ref <150)
VLDL: 38 mg/dL — ABNORMAL HIGH (ref <30)

## 2016-09-28 ENCOUNTER — Ambulatory Visit (INDEPENDENT_AMBULATORY_CARE_PROVIDER_SITE_OTHER): Payer: Medicare Other | Admitting: Podiatry

## 2016-09-28 ENCOUNTER — Encounter: Payer: Self-pay | Admitting: Podiatry

## 2016-09-28 DIAGNOSIS — B351 Tinea unguium: Secondary | ICD-10-CM | POA: Diagnosis not present

## 2016-09-28 DIAGNOSIS — M79675 Pain in left toe(s): Secondary | ICD-10-CM

## 2016-09-28 DIAGNOSIS — M79674 Pain in right toe(s): Secondary | ICD-10-CM | POA: Diagnosis not present

## 2016-09-28 NOTE — Progress Notes (Signed)
Patient ID: Travis [name], male   DOB: [birth year], 76 y.o.   MRN: [id number]    Subjective: This patient presents today complaining of thickened and elongated toenails which are uncomfortable walking wearing shoes and request toenail debridement Patient's patient's wife is present to treatment room  Objective: Patient is responsive to questioning appears orientated 3 Transfers from wheelchair to treatment table  Vascular: No peripheral edema noted bilaterally DP pulses 2/4 bilaterally PT pulses 0/4 bilaterally Capillary refill is immediate bilaterally  Neurological: Sensation to 10 g monofilament wire intact 5/5 bilaterally Vibratory sensation reactive right nonreactive left Ankle reflex reactive bilaterally  Dermatological: Dry skin bilaterally No open skin lesions bilaterally The toenails are brittle, hypertrophic, elongated, discolored and tender to direct palpation 6-10  Musculoskeletal: There is no restriction ankle, subtalar, midtarsal joints bilaterally No deformities noted bilaterally    Patient has stable gait using roller walker  Assessment: Decreased posterior tibial pulses bilaterally Symptomatic onychomycoses 6-10  Plan: . The toenails 6-10 are debrided mechanically an electrically without any Bleeding  Reappoint 3 months

## 2016-10-25 ENCOUNTER — Encounter: Payer: Self-pay | Admitting: *Deleted

## 2016-11-17 IMAGING — CR DG CHEST 1V PORT
1 series · 1 of 1 positions shown · non-contrast
Comparison: Portable exam 3243 hours compared to 05/14/2015

CLINICAL DATA: Pulmonary edema, pulmonary fibrosis, end-stage renal
disease on dialysis, acute on chronic diastolic CHF

EXAM:
PORTABLE CHEST - 1 VIEW

[AP]
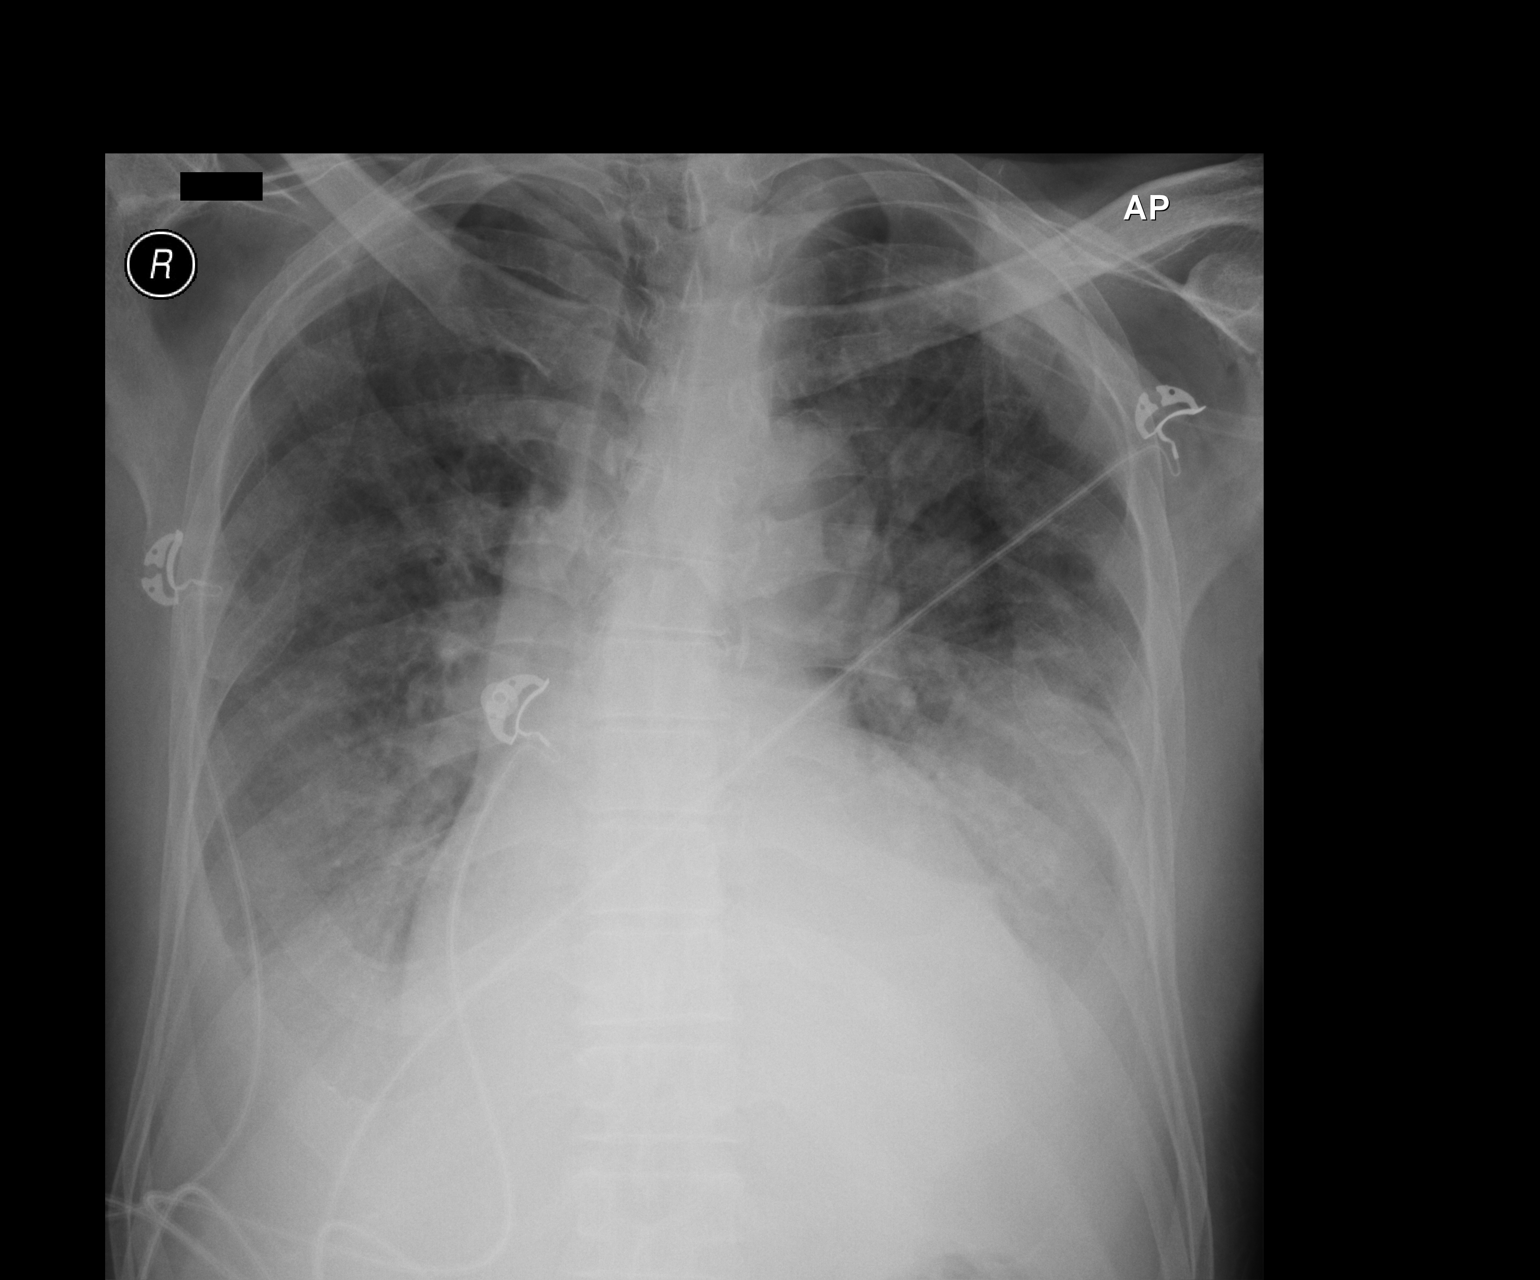

[1 of 1 positions shown; findings below may reference images not displayed]

FINDINGS: Enlargement of cardiac silhouette with pulmonary vascular
congestion.

Diffuse pulmonary infiltrates favoring pulmonary edema over
infection, slightly improved.

Bibasilar effusions.

No pneumothorax.

Bones unremarkable.
IMPRESSION: Enlargement of cardiac silhouette with pulmonary vascular
congestion.

Bibasilar effusions with slightly improved BILATERAL pulmonary
infiltrates, favor pulmonary edema/CHF.

## 2016-12-23 DIAGNOSIS — S72002D Fracture of unspecified part of neck of left femur, subsequent encounter for closed fracture with routine healing: Secondary | ICD-10-CM | POA: Insufficient documentation

## 2016-12-28 ENCOUNTER — Ambulatory Visit (INDEPENDENT_AMBULATORY_CARE_PROVIDER_SITE_OTHER): Payer: Medicare Other | Admitting: Podiatry

## 2016-12-28 ENCOUNTER — Encounter: Payer: Self-pay | Admitting: Podiatry

## 2016-12-28 DIAGNOSIS — M79674 Pain in right toe(s): Secondary | ICD-10-CM

## 2016-12-28 DIAGNOSIS — M79675 Pain in left toe(s): Secondary | ICD-10-CM

## 2016-12-28 DIAGNOSIS — B351 Tinea unguium: Secondary | ICD-10-CM

## 2016-12-28 DIAGNOSIS — R0989 Other specified symptoms and signs involving the circulatory and respiratory systems: Secondary | ICD-10-CM

## 2016-12-28 NOTE — Progress Notes (Signed)
Patient ID: Travis [name], male   DOB: 01/29/1941, 76 y.o.   MRN: [id number]     Subjective: This patient presents today complaining of thickened and elongated toenails which are uncomfortable walking wearing shoes and request toenail debridement Patient's patient's wife ispresent to treatment room  Objective: Patient is responsive to questioning appears orientated 3 Transfers from wheelchair to treatment table  Vascular: No peripheral edema noted bilaterally DP pulses 2/4 bilaterally PT pulses 0/4 bilaterally Capillary refill is immediate bilaterally  Neurological: Sensation to 10 g monofilament wire intact 5/5 bilaterally Vibratory sensation reactive right nonreactive left Ankle reflex reactive bilaterally  Dermatological: Dry skin bilaterally Atrophic skin without hair growth bilaterally No open skin lesions bilaterally The toenails are brittle, hypertrophic, elongated, discolored and tender to direct palpation 6-10  Musculoskeletal: There is no restriction ankle, subtalar, midtarsal joints bilaterally No deformities noted bilaterally Hammertoe fourth left     Patient has stable gait using roller walker  Assessment: 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 The toenails 6-10 are debrided mechanically an electrically without any Bleeding  Reappoint 3 months

## 2017-02-21 IMAGING — CR DG CHEST 2V
2 series · 2 of 2 positions shown · non-contrast
Comparison: Chest radiograph performed 08/03/2015

CLINICAL DATA: Acute onset of shortness of breath. Initial
encounter.

EXAM:
CHEST  2 VIEW

[chest lat]
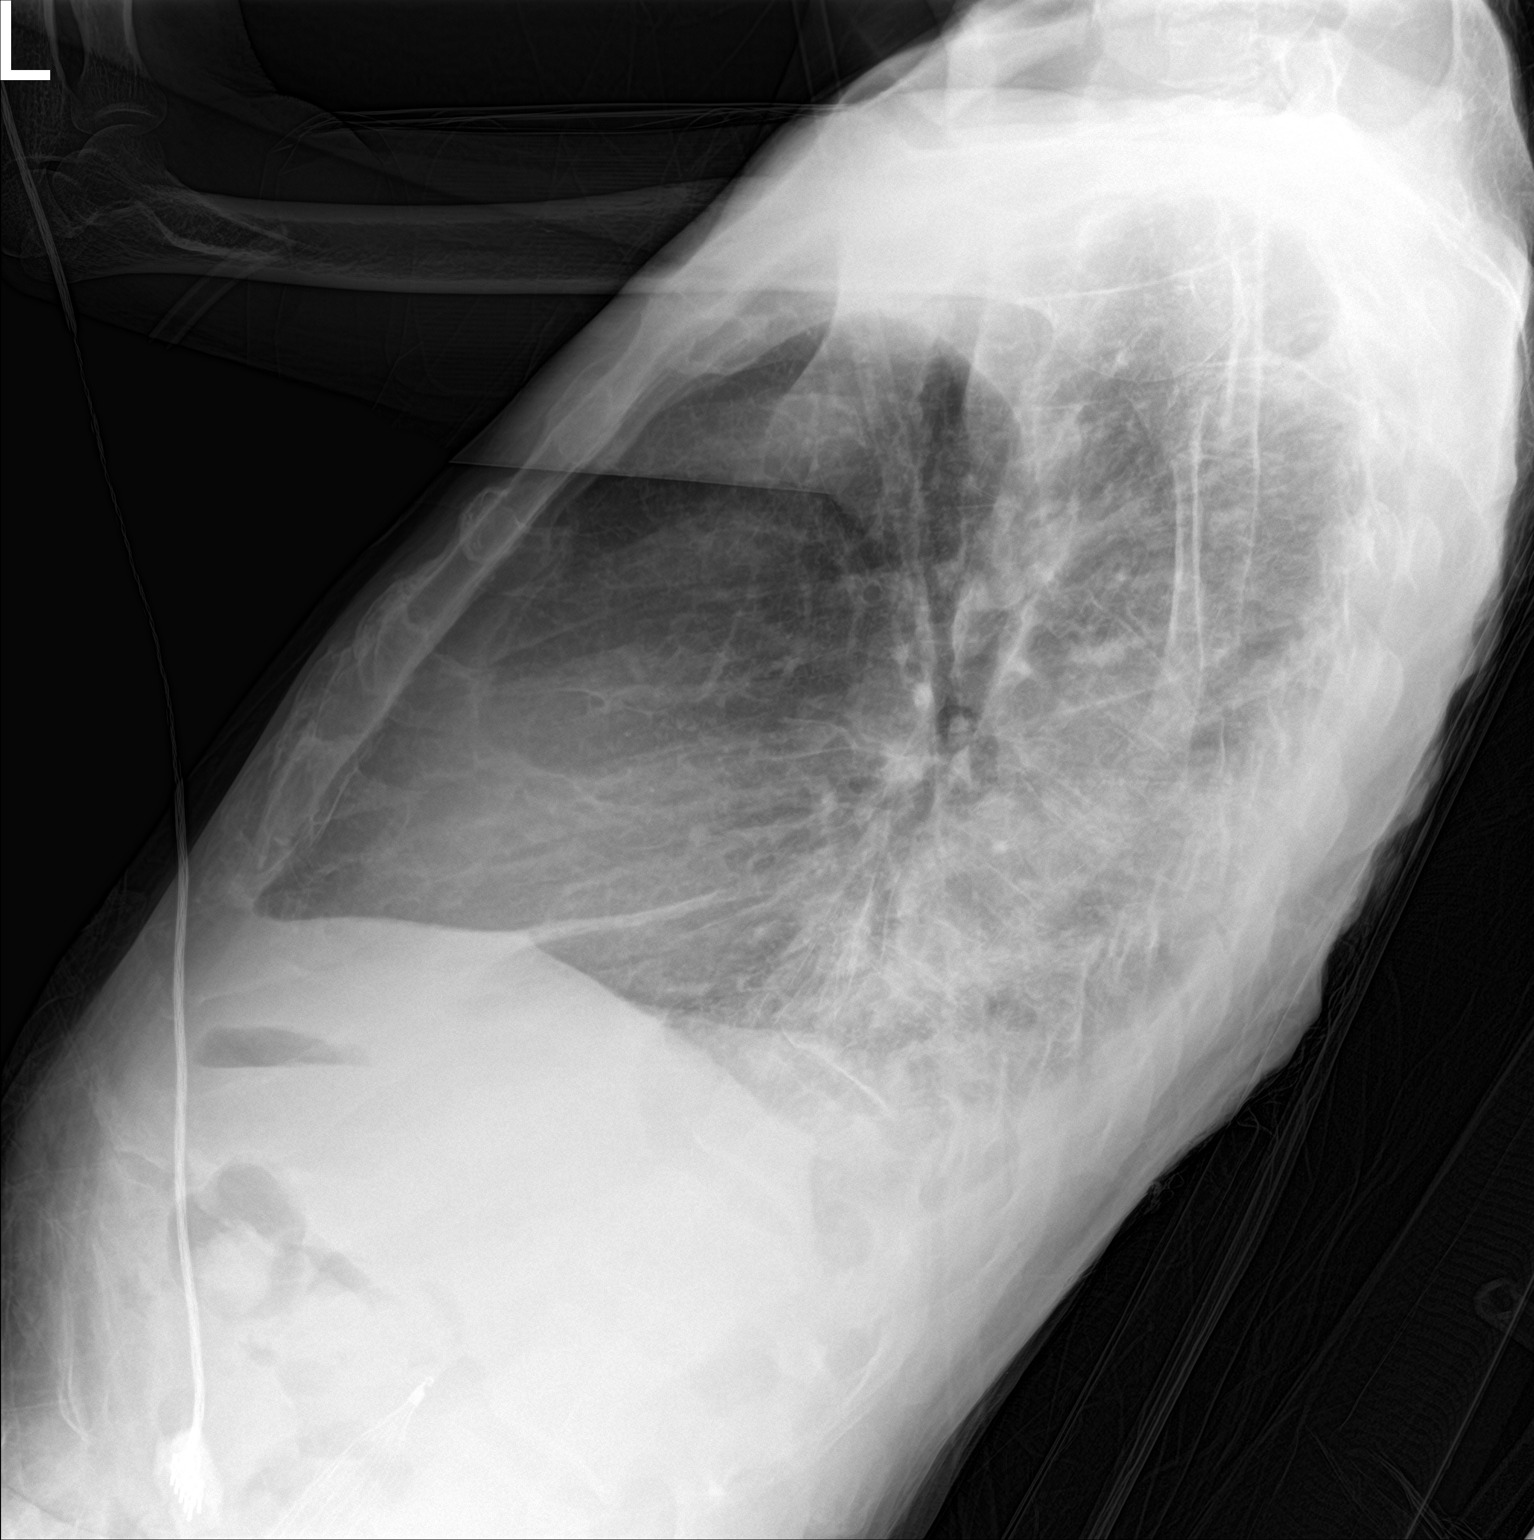

[chest ap]
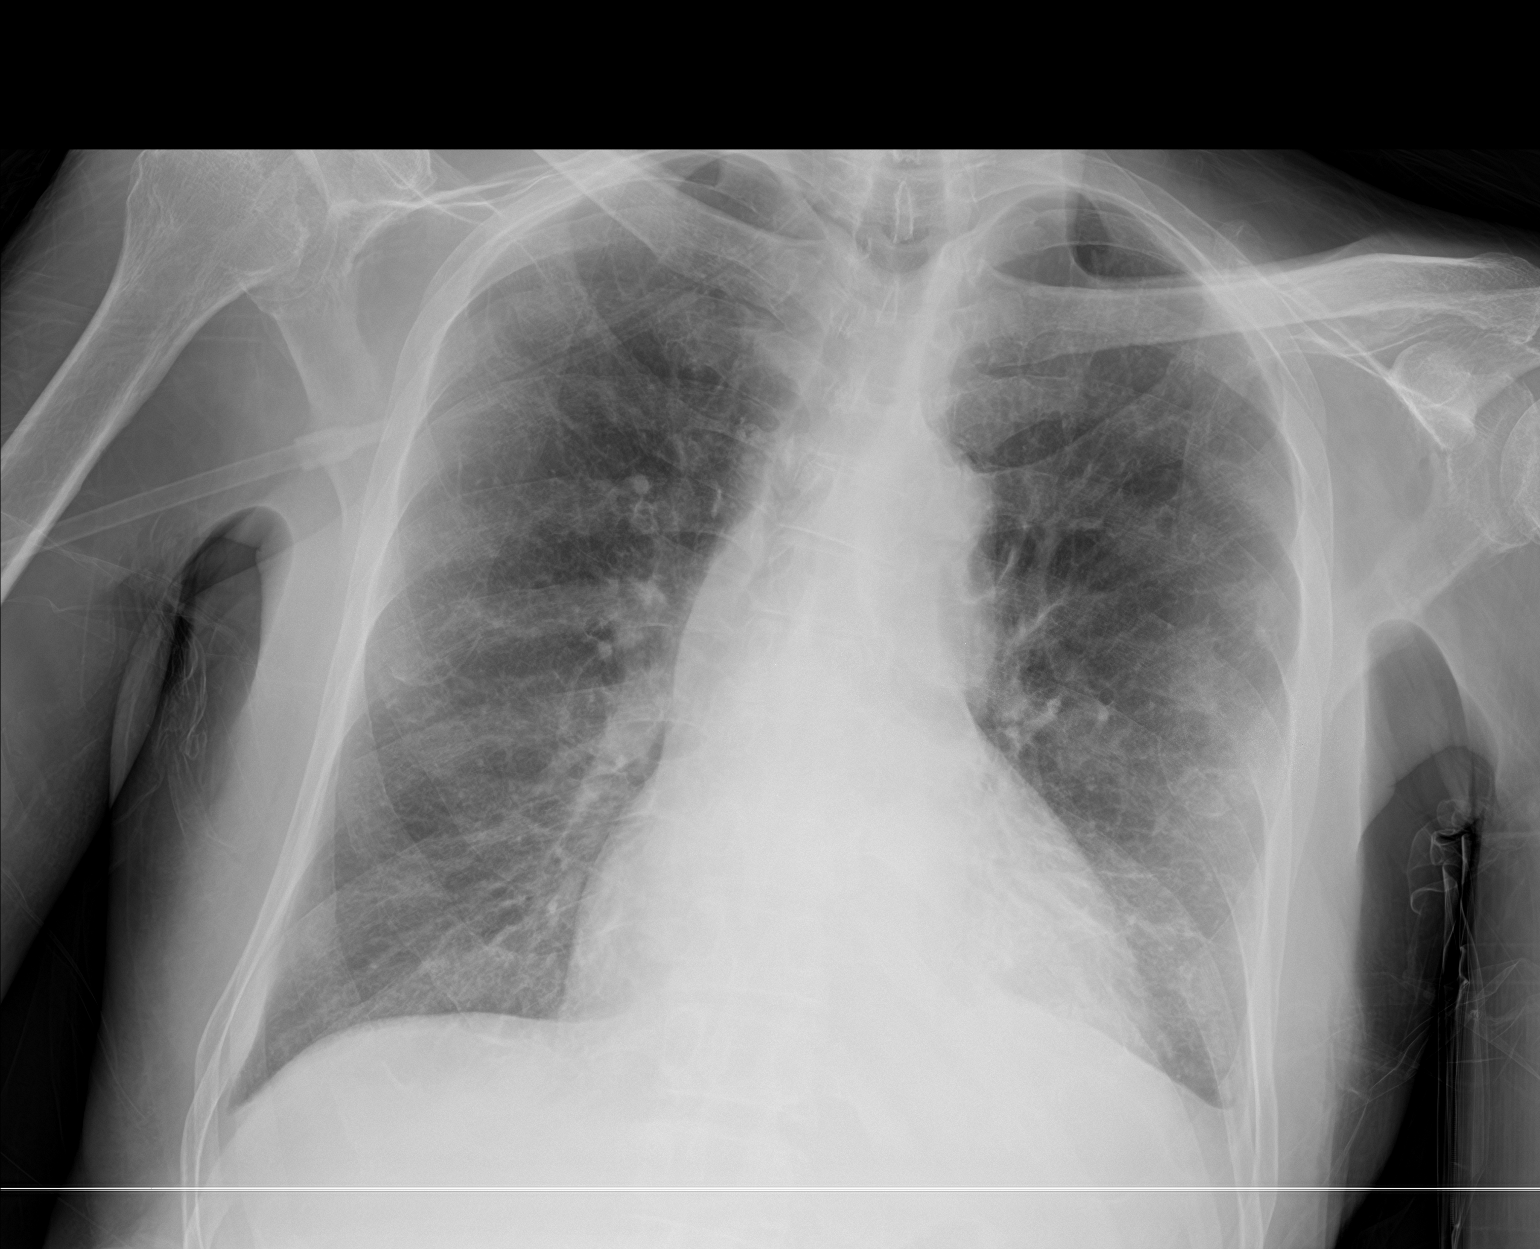

[2 of 2 positions shown; findings below may reference images not displayed]

FINDINGS: Small bilateral pleural effusions are noted, left greater than
right. Increased interstitial markings may reflect mild interstitial
edema. No pneumothorax is seen.

The heart is borderline enlarged. No acute osseous abnormalities are
identified.
IMPRESSION: Small bilateral pleural effusions, left greater than right.
Increased interstitial markings may reflect mild interstitial edema.
Borderline cardiomegaly.

## 2017-03-14 ENCOUNTER — Encounter: Payer: Self-pay | Admitting: Vascular Surgery

## 2017-03-24 ENCOUNTER — Encounter: Payer: Self-pay | Admitting: Vascular Surgery

## 2017-03-24 ENCOUNTER — Other Ambulatory Visit: Payer: Self-pay | Admitting: *Deleted

## 2017-03-24 ENCOUNTER — Encounter: Payer: Self-pay | Admitting: *Deleted

## 2017-03-24 ENCOUNTER — Ambulatory Visit (INDEPENDENT_AMBULATORY_CARE_PROVIDER_SITE_OTHER): Payer: Non-veteran care | Admitting: Vascular Surgery

## 2017-03-24 VITALS — BP 104/66 | HR 77 | Temp 97.9°F | Resp 16 | Ht 70.0 in | Wt 146.5 lb

## 2017-03-24 DIAGNOSIS — N186 End stage renal disease: Secondary | ICD-10-CM

## 2017-03-24 DIAGNOSIS — Z992 Dependence on renal dialysis: Secondary | ICD-10-CM | POA: Diagnosis not present

## 2017-03-24 NOTE — Progress Notes (Signed)
Patient is a 76 year old male referred by Dr. 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 The patient dialyzes Monday Wednesday Friday. He had recent angioplasty of the venous outflow by Dr. Augustin Coupe 02/17/2017. Dr. limb was concerned about possibility of bleeding from the aneurysm. 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 The patient has had no bleeding episodes from the aneurysm.  Past Medical History:  Diagnosis Date  . Anemia   . Arthritis   . Chronic combined systolic and diastolic CHF (congestive heart failure) ([place])    a. Etiology of low EF not defined.  . Chronic respiratory failure ([place])   . DVT (deep venous thrombosis) ([place])    a. 01/2015: RLE DVT. VQ scan intermediate probability. Underwent renal bx complicated by perinephric hematoma; anticoagulation stopped and IVC filter placed.  Marland Kitchen ESRD on hemodialysis [facility]) June 2016   a. 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 Received plasma exchange and steroids but didn't respond and ended up starting hemodialysis June 2016.   Marland Kitchen Essential hypertension   . GERD (gastroesophageal reflux disease)   . Perinephric hematoma 01/2015  . Pneumonia   . 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 Protein calorie malnutrition ([place])   . Pulmonary fibrosis ([place])   . Sjogren's disease ([place]) 01/28/2015  . Syncope    a. 04/2015 in HD in setting of BP 50s.  . Unintentional weight loss     Past Surgical History:  Procedure Laterality Date  . AV FISTULA PLACEMENT Right 02/17/2015   Procedure: INSERTION OF RIGHT ARM  ARTERIOVENOUS (AV) GORE-TEX GRAFT ;  Surgeon: Elam Dutch, MD;  Location: [place];  Service: Vascular;  Laterality: Right;  . FLEXIBLE SIGMOIDOSCOPY N/A 08/04/2015   Procedure: FLEXIBLE SIGMOIDOSCOPY;  Surgeon: Wilford Corner, MD;  Location: [facility] ENDOSCOPY;  Service: Endoscopy;  Laterality: N/A;  . [place]  . INSERTION OF DIALYSIS CATHETER N/A 02/17/2015   Procedure: INSERTION OF DIALYSIS CATHETER RIGHT INTERNAL JUGULAR VEIN;  Surgeon: Elam Dutch, MD;  Location: [facility] OR;  Service: Vascular;  Laterality: N/A;  . Surgical procedure to remove a mole as a child Right Eye area   At around 45 years old    Current Outpatient Prescriptions on File Prior to Visit  Medication Sig Dispense Refill  . aspirin EC 81 MG EC tablet Take 1 tablet (81 mg total) by mouth daily. 30 tablet 0  . atorvastatin (LIPITOR) 40 MG tablet Take 1 tablet (40 mg total) by mouth daily at 6 PM. 30 tablet 0  . carvedilol (COREG) 3.125 MG tablet Take 1 tablet (3.125 mg total) by mouth 2 (two) times daily. 180 tablet 3  . cyanocobalamin 500 MCG tablet Take 1 tablet (500 mcg total) by mouth daily. 30 tablet 0  . gabapentin (NEURONTIN) 100 MG capsule Take 100 mg by mouth at bedtime.    . hydroxychloroquine (PLAQUENIL) 200 MG tablet Take 400 mg by mouth daily.    Marland Kitchen omeprazole (PRILOSEC) 20 MG capsule TAKE 1 CAPSULE (20 MG TOTAL) BY MOUTH DAILY.  11  . sevelamer carbonate (RENVELA) 800 MG tablet Take 2 tablets (1,600 mg total) by mouth 3 (three) times daily with meals. 90 tablet 0   No current facility-administered medications on file prior to visit.     Review of systems: He denies chest pain. He denies shortness of breath.  Physical exam:  Vitals:   03/24/17 0924  BP: 104/66  Pulse: 77  Resp: 16  Temp: 97.9 F (36.6 C)  TempSrc: Oral  SpO2: 100%  Weight: 146 lb 8 oz (66.5 kg)  Height: 5\' 10"  (1.778 m)    Extremities: Right forearm AV graft aneurysmal degeneration along the arterial aspect midportion. The ulnar aspect of the graft although appearing slightly dilated really has no focal aneurysm.  Assessment: Aneurysmal degeneration right forearm AV graft at risk for bleeding.  Plan: Revision with placement of an interposition graft for my partner Dr. Donzetta Matters next week.  Ruta Hinds, MD [facility] [place] Office: [phone] Pager: [phone]

## 2017-03-28 ENCOUNTER — Encounter (HOSPITAL_COMMUNITY): Payer: Self-pay | Admitting: *Deleted

## 2017-03-28 ENCOUNTER — Other Ambulatory Visit: Payer: Self-pay

## 2017-03-28 NOTE — Progress Notes (Signed)
Pt SDW- pre-op call completed by both pt and pt spouse, Burman Nieves. Spouse denies that pt C/O any acute cardiopulmonary issues. Spouse stated that pt is under the care of Dr. Meda Coffee, Cardiology. Spouse denies that pt had a stress test and cardiac cath. Spouse made aware to have pt stop taking otc vitamins, fish oil and herbal medications. Do not take any NSAIDs ie: Ibuprofen, Advil, Naproxen ( Aleve), Motrin, BC and Goody Powder. Spouse verbalized understanding of all pre-op instructions. Anestesia asked to review pt history ( see note).

## 2017-03-28 NOTE — Progress Notes (Signed)
Anesthesia Chart Review:  Pt is a same day work up.   - PCP is Travis [name], MD -Cardiologist is Travis Dawley, MD. Last office visit 09/02/16.   Pt is a 76 year old male scheduled for revision of AV goretex graft on 03/29/2017 with Travis Barthel, MD  PMH includes:  Combined systolic and diastolic CHF, HTN, pulmonary fibrosis, DVT (IVC filter, anticoag stopped due to perinephric hematoma), ESRD on hemodialysis, Sjogren's disease, pericardial effusion 10/2015 in the setting of sepsis, resolved), GERD. Uses O2 at all times.  Never smoker. BMI 21  Medications include: ASA 81 mg, Lipitor, carvedilol, Plaquenil, Prilosec  Labs will be obtained DOS  EKG 09/02/16: NSR. Moderate voltage criteria for LVH, may be normal variant. Nonspecific T wave abnormality.  Echo 08/24/16:  - Left ventricle: The cavity size was normal. Wall thickness was increased in a pattern of moderate LVH. Systolic function was normal. The estimated ejection fraction was in the range of 60% to 65%. Wall motion was normal; there were no regional wall motion abnormalities. Doppler parameters are consistent with abnormal left ventricular relaxation (grade 1 diastolic dysfunction). - Aortic valve: There was trivial regurgitation. - Ascending aorta: The ascending aorta was mildly dilated. - Mitral valve: Calcified annulus. Mildly thickened leaflets . - Impressions: Normal LV systolic function; moderate LVH; trace AI.  If labs acceptable DOS, I anticipate pt can proceed as scheduled.   Willeen Cass, FNP-BC [facility]/Anesthesiology Phone: [phone] 03/28/2017 11:34 AM

## 2017-03-29 ENCOUNTER — Ambulatory Visit: Payer: Medicare Other | Admitting: Podiatry

## 2017-03-29 ENCOUNTER — Telehealth: Payer: Self-pay | Admitting: Vascular Surgery

## 2017-03-29 ENCOUNTER — Ambulatory Visit (HOSPITAL_COMMUNITY): Payer: Medicare Other | Admitting: Emergency Medicine

## 2017-03-29 ENCOUNTER — Encounter (HOSPITAL_COMMUNITY)
Admission: RE | Disposition: A | Discharge status: Home / Self Care | Payer: Self-pay | Source: Ambulatory Visit | Attending: Vascular Surgery

## 2017-03-29 ENCOUNTER — Encounter (HOSPITAL_COMMUNITY): Payer: Self-pay | Admitting: *Deleted

## 2017-03-29 ENCOUNTER — Ambulatory Visit (HOSPITAL_COMMUNITY)
Admission: RE | Admit: 2017-03-29 | Discharge: 2017-03-29 | Disposition: A | Discharge status: Home / Self Care | Payer: Medicare Other | Source: Ambulatory Visit | Attending: Vascular Surgery | Admitting: Vascular Surgery

## 2017-03-29 DIAGNOSIS — I252 Old myocardial infarction: Secondary | ICD-10-CM | POA: Insufficient documentation

## 2017-03-29 DIAGNOSIS — K219 Gastro-esophageal reflux disease without esophagitis: Secondary | ICD-10-CM | POA: Diagnosis not present

## 2017-03-29 DIAGNOSIS — N186 End stage renal disease: Secondary | ICD-10-CM | POA: Insufficient documentation

## 2017-03-29 DIAGNOSIS — I5040 Unspecified combined systolic (congestive) and diastolic (congestive) heart failure: Secondary | ICD-10-CM | POA: Diagnosis not present

## 2017-03-29 DIAGNOSIS — Z86718 Personal history of other venous thrombosis and embolism: Secondary | ICD-10-CM | POA: Diagnosis not present

## 2017-03-29 DIAGNOSIS — I721 Aneurysm of artery of upper extremity: Secondary | ICD-10-CM | POA: Insufficient documentation

## 2017-03-29 DIAGNOSIS — J8410 Pulmonary fibrosis, unspecified: Secondary | ICD-10-CM | POA: Insufficient documentation

## 2017-03-29 DIAGNOSIS — I132 Hypertensive heart and chronic kidney disease with heart failure and with stage 5 chronic kidney disease, or end stage renal disease: Secondary | ICD-10-CM | POA: Diagnosis not present

## 2017-03-29 DIAGNOSIS — Z7901 Long term (current) use of anticoagulants: Secondary | ICD-10-CM | POA: Insufficient documentation

## 2017-03-29 DIAGNOSIS — Z992 Dependence on renal dialysis: Secondary | ICD-10-CM

## 2017-03-29 DIAGNOSIS — Z862 Personal history of diseases of the blood and blood-forming organs and certain disorders involving the immune mechanism: Secondary | ICD-10-CM | POA: Insufficient documentation

## 2017-03-29 DIAGNOSIS — T82898A Other specified complication of vascular prosthetic devices, implants and grafts, initial encounter: Secondary | ICD-10-CM

## 2017-03-29 HISTORY — DX: Presence of dental prosthetic device (complete) (partial): Z97.2

## 2017-03-29 HISTORY — PX: REVISION OF ARTERIOVENOUS GORETEX GRAFT: SHX6073

## 2017-03-29 LAB — POCT I-STAT 4, (NA,K, GLUC, HGB,HCT)
Glucose, Bld: 85 mg/dL (ref 65–99)
HCT: 31 % — ABNORMAL LOW (ref 39.0–52.0)
Hemoglobin: 10.5 g/dL — ABNORMAL LOW (ref 13.0–17.0)
POTASSIUM: 4.3 mmol/L (ref 3.5–5.1)
SODIUM: 136 mmol/L (ref 135–145)

## 2017-03-29 SURGERY — REVISION OF ARTERIOVENOUS GORETEX GRAFT
Anesthesia: General | Site: Arm Lower | Laterality: Right

## 2017-03-29 MED ORDER — HEPARIN SODIUM (PORCINE) 1000 UNIT/ML IJ SOLN
INTRAMUSCULAR | Status: DC | PRN
  Administered 2017-03-29: 7000 [IU] via INTRAVENOUS

## 2017-03-29 MED ORDER — MIDAZOLAM HCL 2 MG/2ML IJ SOLN
INTRAMUSCULAR | Status: AC
  Filled 2017-03-29: qty 2

## 2017-03-29 MED ORDER — PROTAMINE SULFATE 10 MG/ML IV SOLN
INTRAVENOUS | Status: DC | PRN
  Administered 2017-03-29: 50 mg via INTRAVENOUS

## 2017-03-29 MED ORDER — 0.9 % SODIUM CHLORIDE (POUR BTL) OPTIME
TOPICAL | Status: DC | PRN
  Administered 2017-03-29: 1000 mL

## 2017-03-29 MED ORDER — HEMOSTATIC AGENTS (NO CHARGE) OPTIME
TOPICAL | Status: DC | PRN
  Administered 2017-03-29: 1 via TOPICAL

## 2017-03-29 MED ORDER — ONDANSETRON HCL 4 MG/2ML IJ SOLN
INTRAMUSCULAR | Status: DC | PRN
  Administered 2017-03-29: 4 mg via INTRAVENOUS

## 2017-03-29 MED ORDER — DEXTROSE 5 % IV SOLN
1.5000 g | INTRAVENOUS | Status: AC
  Administered 2017-03-29: 1.5 g via INTRAVENOUS
  Filled 2017-03-29: qty 1.5

## 2017-03-29 MED ORDER — SODIUM CHLORIDE 0.9 % IV SOLN: INTRAVENOUS | Status: DC

## 2017-03-29 MED ORDER — FENTANYL CITRATE (PF) 250 MCG/5ML IJ SOLN
INTRAMUSCULAR | Status: AC
  Filled 2017-03-29: qty 5

## 2017-03-29 MED ORDER — LIDOCAINE HCL (CARDIAC) 20 MG/ML IV SOLN
INTRAVENOUS | Status: DC | PRN
  Administered 2017-03-29: 100 mg via INTRAVENOUS

## 2017-03-29 MED ORDER — CHLORHEXIDINE GLUCONATE 4 % EX LIQD: 60.0000 mL | Freq: Once | CUTANEOUS | Status: DC

## 2017-03-29 MED ORDER — SODIUM CHLORIDE 0.9 % IV SOLN
INTRAVENOUS | Status: DC
  Administered 2017-03-29 (×2): via INTRAVENOUS

## 2017-03-29 MED ORDER — FENTANYL CITRATE (PF) 100 MCG/2ML IJ SOLN
INTRAMUSCULAR | Status: DC | PRN
  Administered 2017-03-29: 25 ug via INTRAVENOUS
  Administered 2017-03-29: 50 ug via INTRAVENOUS
  Administered 2017-03-29: 25 ug via INTRAVENOUS
  Administered 2017-03-29: 50 ug via INTRAVENOUS

## 2017-03-29 MED ORDER — MIDAZOLAM HCL 5 MG/5ML IJ SOLN
INTRAMUSCULAR | Status: DC | PRN
  Administered 2017-03-29: 2 mg via INTRAVENOUS

## 2017-03-29 MED ORDER — ONDANSETRON HCL 4 MG/2ML IJ SOLN: 4.0000 mg | Freq: Once | INTRAMUSCULAR | Status: DC | PRN

## 2017-03-29 MED ORDER — MEPERIDINE HCL 25 MG/ML IJ SOLN: 6.2500 mg | INTRAMUSCULAR | Status: DC | PRN

## 2017-03-29 MED ORDER — PROPOFOL 10 MG/ML IV BOLUS
INTRAVENOUS | Status: AC
  Filled 2017-03-29: qty 20

## 2017-03-29 MED ORDER — PROPOFOL 10 MG/ML IV BOLUS
INTRAVENOUS | Status: DC | PRN
  Administered 2017-03-29: 150 mg via INTRAVENOUS

## 2017-03-29 MED ORDER — EPHEDRINE SULFATE 50 MG/ML IJ SOLN
INTRAMUSCULAR | Status: DC | PRN
  Administered 2017-03-29 (×2): 5 mg via INTRAVENOUS

## 2017-03-29 MED ORDER — HYDROMORPHONE HCL 1 MG/ML IJ SOLN: 0.2500 mg | INTRAMUSCULAR | Status: DC | PRN

## 2017-03-29 MED ORDER — OXYCODONE-ACETAMINOPHEN 5-325 MG PO TABS: 1.0000 | ORAL_TABLET | Freq: Four times a day (QID) | ORAL | 0 refills | Status: DC | PRN

## 2017-03-29 MED ORDER — SODIUM CHLORIDE 0.9 % IV SOLN
INTRAVENOUS | Status: DC | PRN
  Administered 2017-03-29: 07:00:00 500 mL

## 2017-03-29 SURGICAL SUPPLY — 42 items
ADH SKN CLS APL DERMABOND .7 (GAUZE/BANDAGES/DRESSINGS) ×1
AGENT HMST SPONGE THK3/8 (HEMOSTASIS)
CANISTER SUCT 3000ML PPV (MISCELLANEOUS) ×3 IMPLANT
CLIP VESOCCLUDE MED 6/CT (CLIP) ×3 IMPLANT
CLIP VESOCCLUDE SM WIDE 6/CT (CLIP) ×3 IMPLANT
COVER PROBE W GEL 5X96 (DRAPES) ×3 IMPLANT
DECANTER SPIKE VIAL GLASS SM (MISCELLANEOUS) ×3 IMPLANT
DERMABOND ADVANCED (GAUZE/BANDAGES/DRESSINGS) ×2
DERMABOND ADVANCED .7 DNX12 (GAUZE/BANDAGES/DRESSINGS) ×1 IMPLANT
ELECT REM PT RETURN 9FT ADLT (ELECTROSURGICAL) ×3
ELECTRODE REM PT RTRN 9FT ADLT (ELECTROSURGICAL) ×1 IMPLANT
GLOVE BIO SURGEON STRL SZ 6.5 (GLOVE) ×1 IMPLANT
GLOVE BIO SURGEON STRL SZ7 (GLOVE) ×3 IMPLANT
GLOVE BIO SURGEONS STRL SZ 6.5 (GLOVE) ×1
GLOVE BIOGEL PI IND STRL 6.5 (GLOVE) IMPLANT
GLOVE BIOGEL PI IND STRL 7.0 (GLOVE) IMPLANT
GLOVE BIOGEL PI IND STRL 7.5 (GLOVE) ×1 IMPLANT
GLOVE BIOGEL PI INDICATOR 6.5 (GLOVE) ×2
GLOVE BIOGEL PI INDICATOR 7.0 (GLOVE) ×2
GLOVE BIOGEL PI INDICATOR 7.5 (GLOVE) ×2
GLOVE SURG SS PI 6.5 STRL IVOR (GLOVE) ×2 IMPLANT
GOWN STRL REUS W/ TWL LRG LVL3 (GOWN DISPOSABLE) ×3 IMPLANT
GOWN STRL REUS W/ TWL XL LVL3 (GOWN DISPOSABLE) IMPLANT
GOWN STRL REUS W/TWL LRG LVL3 (GOWN DISPOSABLE) ×9
GOWN STRL REUS W/TWL XL LVL3 (GOWN DISPOSABLE) ×6
GRAFT GORETEX 6X10 (Vascular Products) ×2 IMPLANT
HEMOSTAT SPONGE AVITENE ULTRA (HEMOSTASIS) IMPLANT
KIT BASIN OR (CUSTOM PROCEDURE TRAY) ×3 IMPLANT
KIT ROOM TURNOVER OR (KITS) ×3 IMPLANT
NDL HYPO 25GX1X1/2 BEV (NEEDLE) ×1 IMPLANT
NEEDLE HYPO 25GX1X1/2 BEV (NEEDLE) ×3 IMPLANT
NS IRRIG 1000ML POUR BTL (IV SOLUTION) ×3 IMPLANT
PACK CV ACCESS (CUSTOM PROCEDURE TRAY) ×3 IMPLANT
PAD ARMBOARD 7.5X6 YLW CONV (MISCELLANEOUS) ×6 IMPLANT
SUT GORETEX 6.0 TT13 (SUTURE) ×6 IMPLANT
SUT MNCRL AB 4-0 PS2 18 (SUTURE) ×5 IMPLANT
SUT PROLENE 6 0 BV (SUTURE) ×6 IMPLANT
SUT PROLENE 7 0 BV 1 (SUTURE) IMPLANT
SUT VIC AB 3-0 SH 27 (SUTURE) ×6
SUT VIC AB 3-0 SH 27X BRD (SUTURE) ×2 IMPLANT
UNDERPAD 30X30 (UNDERPADS AND DIAPERS) ×3 IMPLANT
WATER STERILE IRR 1000ML POUR (IV SOLUTION) ×3 IMPLANT

## 2017-03-29 NOTE — H&P (View-Only) (Signed)
Patient is a 76 year old male referred by Dr. 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 The patient dialyzes Monday Wednesday Friday. He had recent angioplasty of the venous outflow by Dr. Augustin Coupe 02/17/2017. Dr. limb was concerned about possibility of bleeding from the aneurysm. 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 The patient has had no bleeding episodes from the aneurysm.  Past Medical History:  Diagnosis Date  . Anemia   . Arthritis   . Chronic combined systolic and diastolic CHF (congestive heart failure) ([place])    a. Etiology of low EF not defined.  . Chronic respiratory failure ([place])   . DVT (deep venous thrombosis) ([place])    a. 01/2015: RLE DVT. VQ scan intermediate probability. Underwent renal bx complicated by perinephric hematoma; anticoagulation stopped and IVC filter placed.  Marland Kitchen ESRD on hemodialysis [facility]) June 2016   a. 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 Received plasma exchange and steroids but didn't respond and ended up starting hemodialysis June 2016.   Marland Kitchen Essential hypertension   . GERD (gastroesophageal reflux disease)   . Perinephric hematoma 01/2015  . Pneumonia   . 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 Protein calorie malnutrition ([place])   . Pulmonary fibrosis ([place])   . Sjogren's disease ([place]) 01/28/2015  . Syncope    a. 04/2015 in HD in setting of BP 50s.  . Unintentional weight loss     Past Surgical History:  Procedure Laterality Date  . AV FISTULA PLACEMENT Right 02/17/2015   Procedure: INSERTION OF RIGHT ARM  ARTERIOVENOUS (AV) GORE-TEX GRAFT ;  Surgeon: Elam Dutch, MD;  Location: [place];  Service: Vascular;  Laterality: Right;  . FLEXIBLE SIGMOIDOSCOPY N/A 08/04/2015   Procedure: FLEXIBLE SIGMOIDOSCOPY;  Surgeon: Wilford Corner, MD;  Location: [facility] ENDOSCOPY;  Service: Endoscopy;  Laterality: N/A;  . [place]  . INSERTION OF DIALYSIS CATHETER N/A 02/17/2015   Procedure: INSERTION OF DIALYSIS CATHETER RIGHT INTERNAL JUGULAR VEIN;  Surgeon: Elam Dutch, MD;  Location: [facility] OR;  Service: Vascular;  Laterality: N/A;  . Surgical procedure to remove a mole as a child Right Eye area   At around 80 years old    Current Outpatient Prescriptions on File Prior to Visit  Medication Sig Dispense Refill  . aspirin EC 81 MG EC tablet Take 1 tablet (81 mg total) by mouth daily. 30 tablet 0  . atorvastatin (LIPITOR) 40 MG tablet Take 1 tablet (40 mg total) by mouth daily at 6 PM. 30 tablet 0  . carvedilol (COREG) 3.125 MG tablet Take 1 tablet (3.125 mg total) by mouth 2 (two) times daily. 180 tablet 3  . cyanocobalamin 500 MCG tablet Take 1 tablet (500 mcg total) by mouth daily. 30 tablet 0  . gabapentin (NEURONTIN) 100 MG capsule Take 100 mg by mouth at bedtime.    . hydroxychloroquine (PLAQUENIL) 200 MG tablet Take 400 mg by mouth daily.    Marland Kitchen omeprazole (PRILOSEC) 20 MG capsule TAKE 1 CAPSULE (20 MG TOTAL) BY MOUTH DAILY.  11  . sevelamer carbonate (RENVELA) 800 MG tablet Take 2 tablets (1,600 mg total) by mouth 3 (three) times daily with meals. 90 tablet 0   No current facility-administered medications on file prior to visit.     Review of systems: He denies chest pain. He denies shortness of breath.  Physical exam:  Vitals:   03/24/17 0924  BP: 104/66  Pulse: 77  Resp: 16  Temp: 97.9 F (36.6 C)  TempSrc: Oral  SpO2: 100%  Weight: 146 lb 8 oz (66.5 kg)  Height: 5\' 10"  (1.778 m)    Extremities: Right forearm AV graft aneurysmal degeneration along the arterial aspect midportion. The ulnar aspect of the graft although appearing slightly dilated really has no focal aneurysm.  Assessment: Aneurysmal degeneration right forearm AV graft at risk for bleeding.  Plan: Revision with placement of an interposition graft for my partner Dr. Donzetta Matters next week.  Ruta Hinds, MD [facility] [place] Office: [phone] Pager: [phone]

## 2017-03-29 NOTE — Anesthesia Procedure Notes (Signed)
Procedure Name: LMA Insertion Date/Time: 03/29/2017 7:42 AM Performed by: Shirlyn Goltz Pre-anesthesia Checklist: Patient identified, Emergency Drugs available, Suction available and Patient being monitored Patient Re-evaluated:Patient Re-evaluated prior to induction Oxygen Delivery Method: Circle system utilized Preoxygenation: Pre-oxygenation with 100% oxygen Induction Type: IV induction Ventilation: Mask ventilation without difficulty LMA: LMA inserted LMA Size: 5.0 Number of attempts: 1 Placement Confirmation: positive ETCO2 and breath sounds checked- equal and bilateral Tube secured with: Tape Dental Injury: Teeth and Oropharynx as per pre-operative assessment

## 2017-03-29 NOTE — Anesthesia Preprocedure Evaluation (Signed)
Anesthesia Evaluation  Patient identified by MRN, date of birth, ID band Patient awake    Reviewed: Allergy & Precautions, NPO status , Patient's Chart, lab work & pertinent test results  Airway Mallampati: I  TM Distance: >3 FB Neck ROM: Full    Dental   Pulmonary    Pulmonary exam normal        Cardiovascular hypertension, Pt. on medications + Past MI  Normal cardiovascular exam     Neuro/Psych    GI/Hepatic GERD  Medicated and Controlled,  Endo/Other    Renal/GU CRF and DialysisRenal disease     Musculoskeletal   Abdominal   Peds  Hematology   Anesthesia Other Findings   Reproductive/Obstetrics                             Anesthesia Physical Anesthesia Plan  ASA: III  Anesthesia Plan: General   Post-op Pain Management:    Induction: Intravenous  PONV Risk Score and Plan: 2 and Ondansetron and Dexamethasone  Airway Management Planned: LMA  Additional Equipment:   Intra-op Plan:   Post-operative Plan: Extubation in OR  Informed Consent: I have reviewed the patients History and Physical, chart, labs and discussed the procedure including the risks, benefits and alternatives for the proposed anesthesia with the patient or authorized representative who has indicated his/her understanding and acceptance.     Plan Discussed with: CRNA and Surgeon  Anesthesia Plan Comments:         Anesthesia Quick Evaluation

## 2017-03-29 NOTE — Anesthesia Postprocedure Evaluation (Signed)
Anesthesia Post Note  Patient: Travis [name]  Procedure(s) Performed: Procedure(s) (LRB): REVISION OF ARTERIOVENOUS GORETEX GRAFT (Right)     Patient location during evaluation: PACU Anesthesia Type: General Level of consciousness: awake and alert Pain management: pain level controlled Vital Signs Assessment: post-procedure vital signs reviewed and stable Respiratory status: spontaneous breathing, nonlabored ventilation, respiratory function stable and patient connected to nasal cannula oxygen Cardiovascular status: blood pressure returned to baseline and stable Postop Assessment: no signs of nausea or vomiting Anesthetic complications: no    Last Vitals:  Vitals:   03/29/17 1010 03/29/17 1025  BP:  121/67  Pulse: (!) 59 (!) 55  Resp: 11 17  Temp:      Last Pain:  Vitals:   03/29/17 1006  TempSrc:   PainSc: 0-No pain                 [name] DAVID

## 2017-03-29 NOTE — Transfer of Care (Signed)
Immediate Anesthesia Transfer of Care Note  Patient: Travis [name]  Procedure(s) Performed: Procedure(s): REVISION OF ARTERIOVENOUS GORETEX GRAFT (Right)  Patient Location: PACU  Anesthesia Type:General  Level of Consciousness: drowsy  Airway & Oxygen Therapy: Patient Spontanous Breathing and Patient connected to face mask oxygen  Post-op Assessment: Report given to RN and Post -op Vital signs reviewed and stable  Post vital signs: Reviewed and stable  Last Vitals:  Vitals:   03/29/17 0656  BP: (!) 148/71  Pulse: 64  Resp: 20  Temp: 36.7 C    Last Pain:  Vitals:   03/29/17 0656  TempSrc: Oral      Patients Stated Pain Goal: 2 (34/35/68 6168)  Complications: No apparent anesthesia complications

## 2017-03-29 NOTE — Interval H&P Note (Signed)
   History and Physical Update  The patient was interviewed and re-examined.  The patient's previous History and Physical has been reviewed and is unchanged from Dr. Nona Dell consult.  I agree that there is pseudoaneurysm degeneration of the arterial limb of this forearm arteriovenous graft.  There is no change in the plan of care: revision of right forearm arteriovenous graft with interposition graft.   Risk, benefits, and alternatives to access surgery were discussed.    The patient is aware the risks include but are not limited to: bleeding, infection, steal syndrome, nerve damage, ischemic monomelic neuropathy, thrombosis, failure to mature, complications related to venous hypertension, need for additional procedures, death and stroke.    The patient agrees to proceed forward with the procedure.   Adele Barthel, MD, FACS [facility] [place] Office: [phone] Pager: [phone]  03/29/2017, 7:10 AM

## 2017-03-29 NOTE — Op Note (Signed)
OPERATIVE NOTE   PROCEDURE: 1. Revision of right forearm arteriovenous graft with interposition graft  PRE-OPERATIVE DIAGNOSIS: Pseudoaneurysmal degeneration of arterial limb  POST-OPERATIVE DIAGNOSIS: same as above   SURGEON: Adele Barthel, MD  ASSISTANT(S): Silva Bandy, PAC   ANESTHESIA: general  ESTIMATED BLOOD LOSS: 50 cc  FINDING(S): 1.  Palpable thrill at end of case 2.  Faintly palpable radial pulse at end of case 3.  Scar tissue densely adherent to pseudoaneurysmal segment  SPECIMEN(S):  none  INDICATIONS:   Travis [name] is a 76 y.o. male who presents with pseudoaneurysmal degeneration of the arterial limb of his right forearm loop arteriovenous graft.  Dr. Oneida Alar recommended: revision of arteriovenous graft with interposition arteriovenous graft.  Risk, benefits, and alternatives to access surgery were discussed.  The patient is aware the risks include but are not limited to: bleeding, infection, steal syndrome, nerve damage, ischemic monomelic neuropathy, thrombosis, failure to mature, complications related to venous hypertension, need for additional procedures, death and stroke.  The patient agrees to proceed forward with the procedure.   DESCRIPTION: After obtaining full informed written consent, the patient was brought back to the operating room and placed supine upon the operating table.  The patient received IV antibiotics prior to induction.  A procedure time out was completed and the correct surgical site was verified.  After obtaining adequate anesthesia, the patient was prepped and draped in the standard fashion for: right arm access procedure.  I made an incision over the arteriovenous graft proximal and distal to the pseudoaneurysmal segment.  I dissected out the arteriovenous graft with electrocautery proximal and distal to a pseudoaneurysm segment.  There was scar tissue densely adherent to this segment.  I dissected a subcutaneous tunnel lateral to the  pseudoaneurysmal segment of graft.  I passed a curvilinear metal tunneler through this route and place 6 mm segment of Goretex in this metal tunnel.  I removed the metal tunnel, leaving the graft in place.  The patient was given 7000 units of Heparin intravenously, which was a therapeutic bolus. After waiting 3 minutes, I clamped the graft proximally and distally.  I transected the pseudoaneurysmal segment of graft in the proximal and distal incisions, disconnecting this segment.  I spatulated the old graft and the new graft to facilitate an end-to-end anastomosis.  I sewed the new graft tot the old graft with a running stitch of CV-6.  I unclamped the proximal clamp and allowed bleeding through this length of new graft.  There was no clot present.  I reclamped the old graft proximally and washed out the new graft with heparinized saline.    I repeated this process in the distal incision, spatulating the distal old graft and the the distal end of the new graft to facilitate the end-to-end anastomosis.  I sewed the new graft to the old graft with a running stitch of CV-6.  Prior to completing this anastomosis, I allowed both ends of the old graft to bleed.  There was no clot present.  I washed out the graft and completed the anastomosis in the usual fashion.    I gave 50 mg of Protamine to reverse anticoagulation.  I packed Avitene into each incision.  I held pressure to each incision.  After waiting a few minutes, no further active bleeding was present.  I reapproximated the subcutaneous tissue in each incision with a running stitch of 3-0 Vicryl.  The skin in each incision was reapproximated with a running subcuticular stitch of 4-0  Monocryl.  The skin at each incision was cleaned, dried, and reinforced with Dermabond.   COMPLICATIONS: none  CONDITION: stable   Adele Barthel, MD, [facility] [place] Office: [phone] Pager: [phone]  03/29/2017, 9:00 AM

## 2017-03-29 NOTE — Telephone Encounter (Signed)
-----   Message from Mena Goes, RN sent at 03/29/2017  9:35 AM EDT ----- Regarding: 4-6 weeks   ----- Message ----- From: Conrad [place], MD Sent: 03/29/2017   9:14 AM To: [facility] [address]  Travis [name] [id number] 1940/12/17  PROCEDURE: Revision of right forearm arteriovenous graft with interposition graft  Asst:  Silva Bandy, [facility]   Follow-up: 4 weeks

## 2017-03-29 NOTE — Telephone Encounter (Signed)
Sched appt 05/04/17 at 2:30. Lm on hm#.

## 2017-03-30 ENCOUNTER — Encounter (HOSPITAL_COMMUNITY): Payer: Self-pay | Admitting: Vascular Surgery

## 2017-04-12 ENCOUNTER — Ambulatory Visit: Payer: Medicare Other | Admitting: Podiatry

## 2017-04-21 NOTE — Progress Notes (Signed)
    Postoperative Access Visit   History of Present Illness   Travis [name] is a 76 y.o. year old male who presents for postoperative follow-up for: revision of  right forearm arteriovenous graft with interposition graft (Date: 03/29/17).  The patient's wounds are healed.  The patient notes no steal symptoms.  The patient is able to complete their activities of daily living.  The patient's current symptoms are: none.  Reportedly R forearm was swollen post-op.   Physical Examination   Vitals:   05/04/17 1401  BP: 111/78  Pulse: 79  Resp: 16  Weight: 144 lb (65.3 kg)  Height: 5\' 10"  (1.778 m)    RUE: Incisions are healed, skin feels warm, hand grip is 5/5, sensation in digits is intact, palpable thrill, bruit can be auscultated , tissue lateral to arterial limb appears somewhat swollen on sonosite   Medical Decision Making   Travis [name] is a 76 y.o. year old male who presents s/p revision of right forearm arteriovenous graft with interposition graft   The patient's access is ready for use.  Can start using entire length of AVG  Thank you for allowing [place] to participate in this patient's care.   Adele Barthel, MD, FACS [facility] [place] Office: [phone] Pager: [phone]

## 2017-04-26 ENCOUNTER — Encounter: Payer: Self-pay | Admitting: Vascular Surgery

## 2017-05-03 ENCOUNTER — Ambulatory Visit: Payer: Medicare Other | Admitting: Podiatry

## 2017-05-04 ENCOUNTER — Ambulatory Visit (INDEPENDENT_AMBULATORY_CARE_PROVIDER_SITE_OTHER): Payer: Self-pay | Admitting: Vascular Surgery

## 2017-05-04 ENCOUNTER — Encounter: Payer: Self-pay | Admitting: Vascular Surgery

## 2017-05-04 VITALS — BP 111/78 | HR 79 | Resp 16 | Ht 70.0 in | Wt 144.0 lb

## 2017-05-04 DIAGNOSIS — Z992 Dependence on renal dialysis: Secondary | ICD-10-CM

## 2017-05-04 DIAGNOSIS — N186 End stage renal disease: Secondary | ICD-10-CM

## 2017-06-07 ENCOUNTER — Encounter: Payer: Self-pay | Admitting: Podiatry

## 2017-06-07 ENCOUNTER — Ambulatory Visit (INDEPENDENT_AMBULATORY_CARE_PROVIDER_SITE_OTHER): Payer: Medicare Other | Admitting: Podiatry

## 2017-06-07 DIAGNOSIS — M79675 Pain in left toe(s): Secondary | ICD-10-CM | POA: Diagnosis not present

## 2017-06-07 DIAGNOSIS — R0989 Other specified symptoms and signs involving the circulatory and respiratory systems: Secondary | ICD-10-CM

## 2017-06-07 DIAGNOSIS — B351 Tinea unguium: Secondary | ICD-10-CM

## 2017-06-07 DIAGNOSIS — M79674 Pain in right toe(s): Secondary | ICD-10-CM

## 2017-06-07 NOTE — Progress Notes (Signed)
Patient ID: Travis [name], male   DOB: [birth year], 76 y.o.   MRN: [id number]    Subjective: This patient presents today complaining of thickened and elongated toenails which are uncomfortable walking wearing shoes and request toenail debridement Patient's patient's wife ispresent to treatment room  Objective: Patient is responsive to questioning appears orientated 3   Vascular: No peripheral edema noted bilaterally DP pulses 2/4 bilaterally PT pulses 0/4 bilaterally Capillary refill is delayed bilaterally  Neurological: Sensation to 10 g monofilament wire intact 5/5 bilaterally Vibratory sensation reactive right nonreactive left Ankle reflex reactive bilaterally  Dermatological: Dry skin bilaterally Atrophic skin without hair growth bilaterally No open skin lesions bilaterally The toenails are brittle, hypertrophic, elongated, discolored and tender to direct palpation 6-10  Musculoskeletal: There is no restriction ankle, subtalar, midtarsal joints bilaterally Walks slowly with a roller walker No deformities noted bilaterally Hammertoe fourth left     Patient has stable gait using roller walker  Assessment: 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 The toenails 6-10 are debrided mechanically an electrically without any Bleeding  Reappoint 3 months

## 2017-07-26 ENCOUNTER — Emergency Department (HOSPITAL_COMMUNITY)
Admission: EM | Admit: 2017-07-26 | Discharge: 2017-07-26 | Disposition: A | Discharge status: Home / Self Care | Payer: Medicare Other | Attending: Emergency Medicine | Admitting: Emergency Medicine

## 2017-07-26 ENCOUNTER — Encounter (HOSPITAL_COMMUNITY): Payer: Self-pay

## 2017-07-26 ENCOUNTER — Other Ambulatory Visit: Payer: Self-pay

## 2017-07-26 DIAGNOSIS — Z992 Dependence on renal dialysis: Secondary | ICD-10-CM | POA: Insufficient documentation

## 2017-07-26 DIAGNOSIS — Z7982 Long term (current) use of aspirin: Secondary | ICD-10-CM | POA: Diagnosis not present

## 2017-07-26 DIAGNOSIS — N186 End stage renal disease: Secondary | ICD-10-CM | POA: Insufficient documentation

## 2017-07-26 DIAGNOSIS — R58 Hemorrhage, not elsewhere classified: Secondary | ICD-10-CM | POA: Insufficient documentation

## 2017-07-26 DIAGNOSIS — Z79899 Other long term (current) drug therapy: Secondary | ICD-10-CM | POA: Diagnosis not present

## 2017-07-26 DIAGNOSIS — I132 Hypertensive heart and chronic kidney disease with heart failure and with stage 5 chronic kidney disease, or end stage renal disease: Secondary | ICD-10-CM | POA: Insufficient documentation

## 2017-07-26 DIAGNOSIS — I5042 Chronic combined systolic (congestive) and diastolic (congestive) heart failure: Secondary | ICD-10-CM | POA: Diagnosis not present

## 2017-07-26 NOTE — ED Notes (Signed)
Pressure dressing applied.

## 2017-07-26 NOTE — ED Triage Notes (Signed)
Patient's dialysis access re-dressed in triage using quick-clot guaze, 4x4 guaze, and coban.

## 2017-07-26 NOTE — ED Notes (Signed)
After 30 min of Wound Seal and pressure dressing, bleeding has stopped. Pt ready for d/c.

## 2017-07-26 NOTE — ED Triage Notes (Signed)
Patient presents with bleeding to right forearm dialysis shunt. Patient reports he last had dialysis 07/25/17 from 8a-12p. Patient reports his access was "fine all day, then this morning I took the bandage off and it started bleeding and wouldn't stop." Patient reports he went to his dialysis nurse, who stated she did not have any anticoagulation guaze and sent him to the ER for further care. Patient's access is slightly bleeding in triage. Bandage in place. Patient alert and Oriented in triage and denies any other symptoms at this time.

## 2017-07-26 NOTE — ED Provider Notes (Signed)
[facility] DEPT Provider Note   CSN: [id number] Arrival date & time: 07/26/17  1124     History   Chief Complaint Chief Complaint  Patient presents with  . Vascular Access Problem    HPI Travis [name] is a 76 y.o. male who presents to the ED with bleeding to the right forearm at the site near his dialysis shunt. Patient reports that he last had dialysis 07/25/17 from 8a to 12p. Patient states that his access was fine all day but this morning when he took the bandage off it started bleeding and he could not get it to stop. Patient went to the dialysis nurse who told him they did not have any anticoagulation guaze and referred him to the ED for further care. RN at triage reported that there was mild bleeding. Rn applied pressure bandage at triage.   HPI  Past Medical History:  Diagnosis Date  . Anemia   . Arthritis   . Chronic combined systolic and diastolic CHF (congestive heart failure) ([place])    a. Etiology of low EF not defined.  . Chronic respiratory failure ([place])   . DVT (deep venous thrombosis) ([place])    a. 01/2015: RLE DVT. VQ scan intermediate probability. Underwent renal bx complicated by perinephric hematoma; anticoagulation stopped and IVC filter placed.  Marland Kitchen ESRD on hemodialysis [facility]) June 2016   a. 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 Received plasma exchange and steroids but didn't respond and ended up starting hemodialysis June 2016.   Marland Kitchen Essential hypertension   . GERD (gastroesophageal reflux disease)   . Perinephric hematoma 01/2015  . Pneumonia   . 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 Protein calorie malnutrition ([place])   . Pulmonary fibrosis ([place])   . Sjogren's disease ([place]) 01/28/2015  . Syncope    a. 04/2015 in HD in setting of BP 50s.  . Unintentional weight loss   . Wears partial dentures     Patient Active Problem List   Diagnosis Date Noted  . Pericardial effusion 11/06/2015  . Chronic  systolic CHF (congestive heart failure), NYHA class 2 ([place]) 11/05/2015  . Sepsis ([place]) 11/05/2015  . SIRS (systemic inflammatory response syndrome) ([place]) 11/05/2015  . Chest pain on breathing   . Thrombocytopenia ([place]) 08/11/2015  . FTT (failure to thrive) in adult 08/11/2015  . Cough   . Rectal pain 07/28/2015  . Fecal impaction ([place]) 07/28/2015  . Rectal bleeding 07/28/2015  . Chronic respiratory failure ([place]) 07/28/2015  . Lower gastrointestinal bleed   . Pulmonary edema 05/14/2015  . Hypotension of hemodialysis 04/25/2015  . Acute systolic CHF (congestive heart failure) ([place]) 04/25/2015  . Protein-calorie malnutrition, severe ([place]) 04/19/2015  . NSTEMI (non-ST elevated myocardial infarction) ([place]) 04/18/2015  . ESRD on dialysis ([place])   . Acute renal failure syndrome ([place])   . AKI (acute kidney injury) ([place])   . Palliative care encounter   . Acute on chronic renal failure ([place]) 01/31/2015  . Acute DVT of right tibial vein ([place]) 01/29/2015  . Acute kidney injury ([place]) 01/29/2015  . DVT (deep venous thrombosis) ([place]) 01/29/2015  . Acute on chronic respiratory failure with hypoxia ([place]) 01/28/2015  . Physical deconditioning 01/28/2015  . Sjogren's disease ([place]) 01/28/2015  . Pedal edema 01/28/2015  . Peripheral edema 01/07/2015  . HCAP (healthcare-associated pneumonia) 01/06/2015  . Steroid-induced hyperglycemia 01/06/2015  . Dyspnea   . Pulmonary fibrosis ([place])   . Knee pain   . Shoulder pain   .  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 Anasarca 12/17/2014  . Fever 12/17/2014  . Elevated blood pressure 12/17/2014  . Normocytic anemia 12/17/2014    Past Surgical History:  Procedure Laterality Date  . HEMORROIDECTOMY  1999  . MULTIPLE TOOTH EXTRACTIONS    . Surgical procedure to remove a mole as a child Right Eye area   At around 78 years old       Home Medications    Prior to Admission medications   Medication Sig Start Date End Date Taking?  Authorizing Provider  aspirin EC 81 MG EC tablet Take 1 tablet (81 mg total) by mouth daily. 04/23/15  Yes Reyne Dumas, MD  atorvastatin (LIPITOR) 40 MG tablet Take 1 tablet (40 mg total) by mouth daily at 6 PM. Patient taking differently: Take 40 mg by mouth at bedtime.  04/23/15  Yes Reyne Dumas, MD  carvedilol (COREG) 6.25 MG tablet Take 3.125 mg by mouth 2 (two) times daily with a meal. With breakfast & with supper   Yes [provider]  cyanocobalamin 500 MCG tablet Take 1 tablet (500 mcg total) by mouth daily. 12/26/14  Yes Donne Hazel, MD  gabapentin (NEURONTIN) 100 MG capsule Take 100 mg by mouth at bedtime. 07/12/16 07/26/17 Yes [provider]  sevelamer carbonate (RENVELA) 800 MG tablet Take 2 tablets (1,600 mg total) by mouth 3 (three) times daily with meals. 05/17/15  Yes Cherene Altes, MD  hydroxypropyl methylcellulose / hypromellose (ISOPTO TEARS / GONIOVISC) 2.5 % ophthalmic solution Place 1 drop into both eyes 3 (three) times daily as needed for dry eyes.    [provider]  omeprazole (PRILOSEC) 20 MG capsule TAKE 1 CAPSULE (20 MG TOTAL) BY MOUTH DAILY. 07/31/15   [provider]  oxyCODONE-acetaminophen (PERCOCET/ROXICET) 5-325 MG tablet Take 1 tablet by mouth every 6 (six) hours as needed. Patient not taking: Reported on 07/26/2017 03/29/17   Virgina Jock A, PA-C  OXYGEN Inhale 2 L into the lungs daily.    [provider]    Family History Family History  Problem Relation Age of Onset  . Hypertension Mother   . Healthy Father   . Hypertension Sister   . Hypertension Brother     Social History Social History   Tobacco Use  . Smoking status: Never Smoker  . Smokeless tobacco: Never Used  Substance Use Topics  . Alcohol use: No    Alcohol/week: 0.0 oz  . Drug use: No     Allergies   No known allergies   Review of Systems Review of Systems  Skin: Positive for wound.     Physical Exam Updated Vital  Signs BP (!) 154/71 (BP Location: Right Arm)   Pulse 66   Temp 98 F (36.7 C) (Oral)   Resp 16   Ht 5\' 10"  (1.778 m)   Wt 66.7 kg (147 lb)   SpO2 99%   BMI 21.09 kg/m   Physical Exam  Constitutional: No distress.  HENT:  Head: Normocephalic.  Neck: Neck supple.  Cardiovascular: Normal rate.  Pulmonary/Chest: Effort normal.  Skin: He is not diaphoretic.  Small area of bleeding to the right forearm.  Psychiatric: He has a normal mood and affect.     ED Treatments / Results  Labs (all labs ordered are listed, but only abnormal results are displayed) Labs Reviewed - No data to display  Radiology No results found.  Procedures: Dr. Darl Householder in to see the patient and quick seal applied to the  area of bleeding then Surgicel and dressing.  Observed for 30 minutes and took dressing down. No further bleeding.    Procedures (including critical care time)  Medications Ordered in ED Medications - No data to display   Initial Impression / Assessment and Plan / ED Course  I have reviewed the triage vital signs and the nursing notes.  76 y.o. male with bleeding after dialysis stable for d/c with bleeding stopped. Return precautions discussed.   Final Clinical Impressions(s) / ED Diagnoses   Final diagnoses:  Bleeding    ED Discharge Orders    None       Debroah Baller McCamey, NP 07/26/17 2259    Drenda Freeze, MD 07/29/17 [phone]

## 2017-07-26 NOTE — Discharge Instructions (Signed)
If the area starts to bleed again follow up with your doctor or return here.

## 2017-09-20 ENCOUNTER — Ambulatory Visit: Payer: Medicare Other | Admitting: Podiatry

## 2017-10-04 ENCOUNTER — Ambulatory Visit: Payer: Medicare Other | Admitting: Podiatry

## 2017-10-04 DIAGNOSIS — B351 Tinea unguium: Secondary | ICD-10-CM

## 2017-10-04 DIAGNOSIS — M79674 Pain in right toe(s): Secondary | ICD-10-CM | POA: Diagnosis not present

## 2017-10-04 DIAGNOSIS — M79675 Pain in left toe(s): Secondary | ICD-10-CM

## 2017-10-04 NOTE — Progress Notes (Signed)
Subjective: 77 y.o. returns the office today for painful, elongated, thickened toenails which he cannot trim himself. Denies any redness or drainage around the nails. Denies any acute changes since last appointment and no new complaints today. Denies any systemic complaints such as fevers, chills, nausea, vomiting.   PCP: [name], MD   Objective: AAO 3, NAD DP 2/4, PT 0/4, this is unchanged  Nails hypertrophic, dystrophic, elongated, brittle, discolored 10. There is tenderness overlying the nails 1-5 bilaterally. There is no surrounding erythema or drainage along the nail sites. No open lesions or pre-ulcerative lesions are identified. No other areas of tenderness bilateral lower extremities. No overlying edema, erythema, increased warmth. No pain with calf compression, swelling, warmth, erythema.  Assessment: Patient presents with symptomatic onychomycosis  Plan: -Treatment options including alternatives, risks, complications were discussed -Nails sharply debrided 10 without complication/bleeding. -Discussed daily foot inspection. If there are any changes, to call the office immediately.  -Follow-up in 3 months or sooner if any problems are to arise. In the meantime, encouraged to call the office with any questions, concerns, changes symptoms.  Celesta Gentile, DPM

## 2018-01-05 ENCOUNTER — Ambulatory Visit: Payer: Medicare Other | Admitting: Podiatry

## 2018-01-12 ENCOUNTER — Encounter: Payer: Self-pay | Admitting: Podiatry

## 2018-01-12 ENCOUNTER — Ambulatory Visit (INDEPENDENT_AMBULATORY_CARE_PROVIDER_SITE_OTHER): Payer: Medicare Other | Admitting: Podiatry

## 2018-01-12 DIAGNOSIS — B351 Tinea unguium: Secondary | ICD-10-CM

## 2018-01-12 DIAGNOSIS — M79674 Pain in right toe(s): Secondary | ICD-10-CM | POA: Diagnosis not present

## 2018-01-12 DIAGNOSIS — I739 Peripheral vascular disease, unspecified: Secondary | ICD-10-CM

## 2018-01-12 DIAGNOSIS — M79675 Pain in left toe(s): Secondary | ICD-10-CM | POA: Diagnosis not present

## 2018-01-16 NOTE — Progress Notes (Signed)
Subjective: 77 y.o. returns the office today for painful, elongated, thickened toenails which he cannot trim himself. Denies any redness or drainage around the nails. Denies any acute changes since last appointment and no new complaints today. Denies any systemic complaints such as fevers, chills, nausea, vomiting.   PCP: [name], MD   Objective: AAO 3, NAD DP 2/4, PT 0/4, this is unchanged  Nails hypertrophic, dystrophic, elongated, brittle, discolored 10. There is tenderness overlying the nails 1-5 bilaterally. There is no surrounding erythema or drainage along the nail sites. No open lesions or pre-ulcerative lesions are identified. No other areas of tenderness bilateral lower extremities. No overlying edema, erythema, increased warmth. No pain with calf compression, swelling, warmth, erythema.  Assessment: Patient presents with symptomatic onychomycosis  Plan: -Treatment options including alternatives, risks, complications were discussed -Nails sharply debrided 10 without complication/bleeding. -Discussed daily foot inspection. If there are any changes, to call the office immediately.  -Follow-up in 3 months or sooner if any problems are to arise. In the meantime, encouraged to call the office with any questions, concerns, changes symptoms.  Celesta Gentile, DPM

## 2018-04-18 ENCOUNTER — Ambulatory Visit: Payer: Medicare Other | Admitting: Podiatry

## 2018-05-02 ENCOUNTER — Ambulatory Visit: Payer: Medicare Other | Admitting: Podiatry

## 2018-05-09 ENCOUNTER — Ambulatory Visit (INDEPENDENT_AMBULATORY_CARE_PROVIDER_SITE_OTHER): Payer: Medicare Other | Admitting: Podiatry

## 2018-05-09 ENCOUNTER — Encounter: Payer: Self-pay | Admitting: Podiatry

## 2018-05-09 DIAGNOSIS — M79675 Pain in left toe(s): Secondary | ICD-10-CM

## 2018-05-09 DIAGNOSIS — B351 Tinea unguium: Secondary | ICD-10-CM

## 2018-05-09 DIAGNOSIS — M79674 Pain in right toe(s): Secondary | ICD-10-CM

## 2018-05-09 NOTE — Progress Notes (Signed)
Subjective: 77 y.o. returns the office today for painful, elongated, thickened toenails which he cannot trim himself. Denies any redness or drainage around the nails. Denies any acute changes since last appointment and no new complaints today. Denies any systemic complaints such as fevers, chills, nausea, vomiting.   PCP: [name], MD   Objective: AAO 3, NAD DP 2/4, PT 0/4, this is unchanged  Nails hypertrophic, dystrophic, elongated, brittle, discolored 10. There is tenderness overlying the nails 1-5 bilaterally. There is no surrounding erythema or drainage along the nail sites. No open lesions or pre-ulcerative lesions are identified. No other areas of tenderness bilateral lower extremities. No overlying edema, erythema, increased warmth. No pain with calf compression, swelling, warmth, erythema.  Assessment: Patient presents with symptomatic onychomycosis  Plan: -Treatment options including alternatives, risks, complications were discussed -Nails sharply debrided 10 without complication/bleeding. -Discussed daily foot inspection. If there are any changes, to call the office immediately.  -Follow-up in 3 months or sooner if any problems are to arise. In the meantime, encouraged to call the office with any questions, concerns, changes symptoms.  Celesta Gentile, DPM

## 2018-08-02 ENCOUNTER — Ambulatory Visit: Payer: Medicare Other | Admitting: Podiatry

## 2018-08-08 ENCOUNTER — Ambulatory Visit: Payer: Medicare Other | Admitting: Podiatry

## 2018-08-17 ENCOUNTER — Ambulatory Visit: Payer: Medicare Other | Admitting: Podiatry

## 2018-08-22 ENCOUNTER — Ambulatory Visit: Payer: Medicare Other | Admitting: Podiatry

## 2018-08-22 ENCOUNTER — Ambulatory Visit (INDEPENDENT_AMBULATORY_CARE_PROVIDER_SITE_OTHER): Payer: Medicare Other | Admitting: Podiatry

## 2018-08-22 DIAGNOSIS — B351 Tinea unguium: Secondary | ICD-10-CM

## 2018-08-22 DIAGNOSIS — M79675 Pain in left toe(s): Secondary | ICD-10-CM | POA: Diagnosis not present

## 2018-08-22 DIAGNOSIS — I739 Peripheral vascular disease, unspecified: Secondary | ICD-10-CM

## 2018-08-22 DIAGNOSIS — M79674 Pain in right toe(s): Secondary | ICD-10-CM | POA: Diagnosis not present

## 2018-08-22 NOTE — Patient Instructions (Signed)

## 2018-09-23 ENCOUNTER — Encounter: Payer: Self-pay | Admitting: Podiatry

## 2018-09-23 NOTE — Progress Notes (Addendum)
Subjective: Travis [name] presents today with painful, thick toenails 1-5 b/l that he cannot cut and which interfere with daily activities.  Pain is aggravated when wearing enclosed shoe gear and relieved with periodic debridement.  Koirala, Dibas, MD is his PCP and     Allergies  Allergen Reactions  . No Known Allergies     Objective:  Vascular Examination: Capillary refill time <3 seconds  x 10 digits  Dorsalis pedis 2/4 b/l Posterior tibial pulses 0/4 b/l  Digital hair absent x 10 digits  Skin temperature gradient WNL b/l  Dermatological Examination: Skin atrophied b/l  Toenails 1-5 b/l discolored, thick, dystrophic with subungual debris and pain with palpation to nailbeds due to thickness of nails.  Musculoskeletal: Muscle strength 5/5 to all LE muscle groups  Hammertoe deformity left foot.  No pain, crepitus or joint limitation noted with ROM.   Uses walker for gait assistance.  Neurological: Sensation intact with 10 gram monofilament b/l  Vibratory sensation diminished b/l  Assessment: Painful onychomycosis toenails 1-5 b/l  PAD  Plan: 1. Toenails 1-5 b/l were debrided in length and girth without iatrogenic bleeding. 2. Patient to continue soft, supportive shoe gear 3. Patient to report any pedal injuries to medical professional immediately. 4. Follow up 3 months. Patient/POA to call should there be a concern in the interim.

## 2018-11-23 ENCOUNTER — Ambulatory Visit: Payer: Medicare Other | Admitting: Podiatry

## 2019-01-17 ENCOUNTER — Other Ambulatory Visit: Payer: Self-pay | Admitting: *Deleted

## 2019-01-17 DIAGNOSIS — T82510A Breakdown (mechanical) of surgically created arteriovenous fistula, initial encounter: Secondary | ICD-10-CM

## 2019-01-18 ENCOUNTER — Encounter: Payer: Self-pay | Admitting: *Deleted

## 2019-01-18 ENCOUNTER — Other Ambulatory Visit: Payer: Self-pay | Admitting: Vascular Surgery

## 2019-01-18 ENCOUNTER — Other Ambulatory Visit: Payer: Self-pay

## 2019-01-18 ENCOUNTER — Ambulatory Visit (HOSPITAL_COMMUNITY)
Admission: RE | Admit: 2019-01-18 | Discharge: 2019-01-18 | Disposition: A | Discharge status: Home / Self Care | Payer: No Typology Code available for payment source | Source: Ambulatory Visit | Attending: Vascular Surgery | Admitting: Vascular Surgery

## 2019-01-18 ENCOUNTER — Encounter: Payer: Self-pay | Admitting: Vascular Surgery

## 2019-01-18 ENCOUNTER — Ambulatory Visit (INDEPENDENT_AMBULATORY_CARE_PROVIDER_SITE_OTHER): Payer: No Typology Code available for payment source | Admitting: Vascular Surgery

## 2019-01-18 VITALS — BP 115/68 | HR 77 | Temp 97.5°F | Resp 20 | Ht 70.0 in | Wt 171.0 lb

## 2019-01-18 DIAGNOSIS — N186 End stage renal disease: Secondary | ICD-10-CM | POA: Diagnosis not present

## 2019-01-18 DIAGNOSIS — Z992 Dependence on renal dialysis: Secondary | ICD-10-CM | POA: Diagnosis not present

## 2019-01-18 DIAGNOSIS — T82510A Breakdown (mechanical) of surgically created arteriovenous fistula, initial encounter: Secondary | ICD-10-CM

## 2019-01-18 NOTE — Progress Notes (Signed)
Referring Physician: Dr Deterding  Patient name: Travis [name] MRN: [id number] DOB: 10-17-1940 Sex: male  REASON FOR CONSULT: Right arm swelling with decreased access flow  HPI: Travis [name] is a 78 y.o. male, with a 3-week history of increased right arm swelling.  The patient has a right forearm AV graft.  This was originally placed in 2016.  It was revised in 2018 with replacement of the arterial limb lateral to the initial limb.  The patient denies numbness or tingling in his hand.  He denies any fever or chills.  According to Dr. Deterding's notes he has had decreased access flow in the right arm graft.  Other medical problems include prior right DVT with IVC filter, hypertension, pulmonary fibrosis.  All of these have been stable.  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 Etiology of low EF not defined.   Chronic respiratory failure ([facility])    DVT (deep venous thrombosis) ([place])    a. 01/2015: RLE DVT. VQ scan intermediate probability. Underwent renal bx complicated by perinephric hematoma; anticoagulation stopped and IVC filter placed.   ESRD on hemodialysis [facility]) June 2016   a. 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 Received plasma exchange and steroids but didn't respond and ended up starting hemodialysis June 2016.    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 04/2015 in HD in setting of BP 50s.   Unintentional weight loss    Wears partial dentures    Past Surgical History:  Procedure Laterality Date   AV FISTULA PLACEMENT Right 02/17/2015   Procedure: INSERTION OF RIGHT ARM  ARTERIOVENOUS (AV) GORE-TEX  GRAFT ;  Surgeon: Elam Dutch, MD;  Location: [facility];  Service: Vascular;  Laterality: Right;   FLEXIBLE SIGMOIDOSCOPY N/A 08/04/2015   Procedure: FLEXIBLE SIGMOIDOSCOPY;  Surgeon: Wilford Corner, MD;  Location: [facility] ENDOSCOPY;  Service: Endoscopy;  Laterality: N/A;   HEMORROIDECTOMY  1999   INSERTION OF DIALYSIS CATHETER N/A 02/17/2015   Procedure: INSERTION OF DIALYSIS CATHETER RIGHT INTERNAL JUGULAR VEIN;  Surgeon: Elam Dutch, MD;  Location: [facility] OR;  Service: Vascular;  Laterality: N/A;   MULTIPLE TOOTH EXTRACTIONS     REVISION OF ARTERIOVENOUS GORETEX GRAFT Right 03/29/2017   Procedure: REVISION OF ARTERIOVENOUS GORETEX GRAFT;  Surgeon: Conrad Sublette, MD;  Location: [facility] OR;  Service: Vascular;  Laterality: Right;   Surgical procedure to remove a mole as a child Right Eye area   At around 56 years old    Family History  Problem Relation Age of Onset   Hypertension Mother    Healthy Father    Hypertension Sister    Hypertension Brother     SOCIAL HISTORY: Social History   Socioeconomic History   Marital status: Married    Spouse name: Not on file   Number of children: Not on file   Years of education: Not on file   Highest education level: Not on file  Occupational History   Not on file  Social Needs   Financial resource strain: Not on file   Food insecurity:    Worry:  Not on file    Inability: Not on file   Transportation needs:    Medical: Not on file    Non-medical: Not on file  Tobacco Use   Smoking status: Never Smoker   Smokeless tobacco: Never Used  Substance and Sexual Activity   Alcohol use: No    Alcohol/week: 0.0 standard drinks   Drug use: No   Sexual activity: Not on file  Lifestyle   Physical activity:    Days per week: Not on file    Minutes per session: Not on file   Stress: Not on file  Relationships   Social connections:    Talks on phone: Not on file    Gets together: Not on file    Attends religious service:  Not on file    Active member of club or organization: Not on file    Attends meetings of clubs or organizations: Not on file    Relationship status: Not on file   Intimate partner violence:    Fear of current or ex partner: Not on file    Emotionally abused: Not on file    Physically abused: Not on file    Forced sexual activity: Not on file  Other Topics Concern   Not on file  Social History Narrative   Not on file    Allergies  Allergen Reactions   No Known Allergies     Current Outpatient Medications  Medication Sig Dispense Refill   aspirin EC 81 MG EC tablet Take 1 tablet (81 mg total) by mouth daily. 30 tablet 0   atorvastatin (LIPITOR) 40 MG tablet Take 1 tablet (40 mg total) by mouth daily at 6 PM. (Patient taking differently: Take 40 mg by mouth at bedtime. ) 30 tablet 0   cyanocobalamin 500 MCG tablet Take 1 tablet (500 mcg total) by mouth daily. 30 tablet 0   hydroxychloroquine (PLAQUENIL) 200 MG tablet Take 200 mg by mouth 2 (two) times daily.     hydroxypropyl methylcellulose / hypromellose (ISOPTO TEARS / GONIOVISC) 2.5 % ophthalmic solution Place 1 drop into both eyes 3 (three) times daily as needed for dry eyes.     omeprazole (PRILOSEC) 20 MG capsule TAKE 1 CAPSULE (20 MG TOTAL) BY MOUTH DAILY.  11   sevelamer carbonate (RENVELA) 800 MG tablet Take 2 tablets (1,600 mg total) by mouth 3 (three) times daily with meals. 90 tablet 0   carvedilol (COREG) 6.25 MG tablet Take 3.125 mg by mouth 2 (two) times daily with a meal. With breakfast & with supper     gabapentin (NEURONTIN) 100 MG capsule Take 100 mg by mouth at bedtime.     OXYGEN Inhale 2 L into the lungs daily.     No current facility-administered medications for this visit.     ROS:   General:  No weight loss, Fever, chills  Cardiac: No recent episodes of chest pain/pressure, no shortness of breath at rest.  No shortness of breath with exertion.  Denies history of atrial fibrillation or  irregular heartbeat  Urinary: [X]  chronic Kidney disease, [X]  on HD - [X]  MWF or [ ]  TTHS, [ ]  Burning with urination, [ ]  Frequent urination, [ ]  Difficulty urinating;   Skin: No rashes  Psychological: No history of anxiety,  No history of depression   Physical Examination  Vitals:   01/18/19 1427  BP: 115/68  Pulse: 77  Resp: 20  Temp: (!) 97.5 F (36.4 C)  SpO2: 100%  Weight: 171 lb (  77.6 kg)  Height: 5\' 10"  (1.778 m)    Body mass index is 24.54 kg/m.  General:  Alert and oriented, no acute distress HEENT: Normal Cardiac: Regular Rate and Rhythm Chest: Clear to auscultation bilaterally, scars from previous right side catheter site Skin: No rash or ulcer Extremity Pulses:  2+ right radial audible bruit and right forearm AV graft.  Clinically arterial limb is on the radial aspect of the forearm.  Some aneurysmal degeneration of the arterial limb of the graft.   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  Risk benefits possible complications of procedure details including but not limited to bleeding infection graft thrombosis need for other indicated procedures were explained to the patient today.   Ruta Hinds, MD [facility] Office: [phone] Pager: [phone]

## 2019-01-18 NOTE — H&P (View-Only) (Signed)
Referring Physician: Dr Deterding  Patient name: Travis [name] MRN: [id number] DOB: 1940/10/20 Sex: male  REASON FOR CONSULT: Right arm swelling with decreased access flow  HPI: Travis [name] is a 78 y.o. male, with a 3-week history of increased right arm swelling.  The patient has a right forearm AV graft.  This was originally placed in 2016.  It was revised in 2018 with replacement of the arterial limb lateral to the initial limb.  The patient denies numbness or tingling in his hand.  He denies any fever or chills.  According to Dr. Deterding's notes he has had decreased access flow in the right arm graft.  Other medical problems include prior right DVT with IVC filter, hypertension, pulmonary fibrosis.  All of these have been stable.  Past Medical History:  Diagnosis Date   Anemia    Arthritis    Chronic combined systolic and diastolic CHF (congestive heart failure) ([place])    a. Etiology of low EF not defined.   Chronic respiratory failure (HCC)    DVT (deep venous thrombosis) ([place])    a. 01/2015: RLE DVT. VQ scan intermediate probability. Underwent renal bx complicated by perinephric hematoma; anticoagulation stopped and IVC filter placed.   ESRD on hemodialysis [facility]) June 2016   a. 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 Received plasma exchange and steroids but didn't respond and ended up starting hemodialysis June 2016.    Essential hypertension    GERD (gastroesophageal reflux disease)    Perinephric hematoma 01/2015   Pneumonia    Proctitis 07/2015   Protein calorie malnutrition ([place])    Pulmonary fibrosis ([place])    Sjogren's disease ([place]) 01/28/2015   Syncope    a. 04/2015 in HD in setting of BP 50s.   Unintentional weight loss    Wears partial dentures    Past Surgical History:  Procedure Laterality Date   AV FISTULA PLACEMENT Right 02/17/2015   Procedure: INSERTION OF RIGHT ARM  ARTERIOVENOUS (AV) GORE-TEX  GRAFT ;  Surgeon: Elam Dutch, MD;  Location: [facility];  Service: Vascular;  Laterality: Right;   FLEXIBLE SIGMOIDOSCOPY N/A 08/04/2015   Procedure: FLEXIBLE SIGMOIDOSCOPY;  Surgeon: Wilford Corner, MD;  Location: [facility] ENDOSCOPY;  Service: Endoscopy;  Laterality: N/A;   HEMORROIDECTOMY  1999   INSERTION OF DIALYSIS CATHETER N/A 02/17/2015   Procedure: INSERTION OF DIALYSIS CATHETER RIGHT INTERNAL JUGULAR VEIN;  Surgeon: Elam Dutch, MD;  Location: [facility] OR;  Service: Vascular;  Laterality: N/A;   MULTIPLE TOOTH EXTRACTIONS     REVISION OF ARTERIOVENOUS GORETEX GRAFT Right 03/29/2017   Procedure: REVISION OF ARTERIOVENOUS GORETEX GRAFT;  Surgeon: Conrad Madaket, MD;  Location: [facility] OR;  Service: Vascular;  Laterality: Right;   Surgical procedure to remove a mole as a child Right Eye area   At around 81 years old    Family History  Problem Relation Age of Onset   Hypertension Mother    Healthy Father    Hypertension Sister    Hypertension Brother     SOCIAL HISTORY: Social History   Socioeconomic History   Marital status: Married    Spouse name: Not on file   Number of children: Not on file   Years of education: Not on file   Highest education level: Not on file  Occupational History   Not on file  Social Needs   Financial resource strain: Not on file   Food insecurity:    Worry:  Not on file    Inability: Not on file   Transportation needs:    Medical: Not on file    Non-medical: Not on file  Tobacco Use   Smoking status: Never Smoker   Smokeless tobacco: Never Used  Substance and Sexual Activity   Alcohol use: No    Alcohol/week: 0.0 standard drinks   Drug use: No   Sexual activity: Not on file  Lifestyle   Physical activity:    Days per week: Not on file    Minutes per session: Not on file   Stress: Not on file  Relationships   Social connections:    Talks on phone: Not on file    Gets together: Not on file    Attends religious service:  Not on file    Active member of club or organization: Not on file    Attends meetings of clubs or organizations: Not on file    Relationship status: Not on file   Intimate partner violence:    Fear of current or ex partner: Not on file    Emotionally abused: Not on file    Physically abused: Not on file    Forced sexual activity: Not on file  Other Topics Concern   Not on file  Social History Narrative   Not on file    Allergies  Allergen Reactions   No Known Allergies     Current Outpatient Medications  Medication Sig Dispense Refill   aspirin EC 81 MG EC tablet Take 1 tablet (81 mg total) by mouth daily. 30 tablet 0   atorvastatin (LIPITOR) 40 MG tablet Take 1 tablet (40 mg total) by mouth daily at 6 PM. (Patient taking differently: Take 40 mg by mouth at bedtime. ) 30 tablet 0   cyanocobalamin 500 MCG tablet Take 1 tablet (500 mcg total) by mouth daily. 30 tablet 0   hydroxychloroquine (PLAQUENIL) 200 MG tablet Take 200 mg by mouth 2 (two) times daily.     hydroxypropyl methylcellulose / hypromellose (ISOPTO TEARS / GONIOVISC) 2.5 % ophthalmic solution Place 1 drop into both eyes 3 (three) times daily as needed for dry eyes.     omeprazole (PRILOSEC) 20 MG capsule TAKE 1 CAPSULE (20 MG TOTAL) BY MOUTH DAILY.  11   sevelamer carbonate (RENVELA) 800 MG tablet Take 2 tablets (1,600 mg total) by mouth 3 (three) times daily with meals. 90 tablet 0   carvedilol (COREG) 6.25 MG tablet Take 3.125 mg by mouth 2 (two) times daily with a meal. With breakfast & with supper     gabapentin (NEURONTIN) 100 MG capsule Take 100 mg by mouth at bedtime.     OXYGEN Inhale 2 L into the lungs daily.     No current facility-administered medications for this visit.     ROS:   General:  No weight loss, Fever, chills  Cardiac: No recent episodes of chest pain/pressure, no shortness of breath at rest.  No shortness of breath with exertion.  Denies history of atrial fibrillation or  irregular heartbeat  Urinary: [X]  chronic Kidney disease, [X]  on HD - [X]  MWF or [ ]  TTHS, [ ]  Burning with urination, [ ]  Frequent urination, [ ]  Difficulty urinating;   Skin: No rashes  Psychological: No history of anxiety,  No history of depression   Physical Examination  Vitals:   01/18/19 1427  BP: 115/68  Pulse: 77  Resp: 20  Temp: (!) 97.5 F (36.4 C)  SpO2: 100%  Weight: 171 lb (  77.6 kg)  Height: 5\' 10"  (1.778 m)    Body mass index is 24.54 kg/m.  General:  Alert and oriented, no acute distress HEENT: Normal Cardiac: Regular Rate and Rhythm Chest: Clear to auscultation bilaterally, scars from previous right side catheter site Skin: No rash or ulcer Extremity Pulses:  2+ right radial audible bruit and right forearm AV graft.  Clinically arterial limb is on the radial aspect of the forearm.  Some aneurysmal degeneration of the arterial limb of the graft.   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  Risk benefits possible complications of procedure details including but not limited to bleeding infection graft thrombosis need for other indicated procedures were explained to the patient today.   Ruta Hinds, MD [facility] [place] Office: [phone] Pager: [phone]

## 2019-01-19 ENCOUNTER — Other Ambulatory Visit: Payer: Self-pay | Admitting: *Deleted

## 2019-01-22 ENCOUNTER — Other Ambulatory Visit: Payer: Self-pay

## 2019-01-22 ENCOUNTER — Other Ambulatory Visit (HOSPITAL_COMMUNITY)
Admission: RE | Admit: 2019-01-22 | Discharge: 2019-01-22 | Disposition: A | Discharge status: Home / Self Care | Payer: No Typology Code available for payment source | Source: Ambulatory Visit | Attending: Vascular Surgery | Admitting: Vascular Surgery

## 2019-01-22 DIAGNOSIS — Z1159 Encounter for screening for other viral diseases: Secondary | ICD-10-CM | POA: Diagnosis present

## 2019-01-22 LAB — SARS CORONAVIRUS 2 BY RT PCR (HOSPITAL ORDER, PERFORMED IN ~~LOC~~ HOSPITAL LAB): SARS Coronavirus 2: NEGATIVE

## 2019-01-23 ENCOUNTER — Encounter (HOSPITAL_COMMUNITY)
Admission: RE | Disposition: A | Discharge status: Home / Self Care | Payer: Self-pay | Source: Home / Self Care | Attending: Vascular Surgery

## 2019-01-23 ENCOUNTER — Encounter (HOSPITAL_COMMUNITY): Payer: Self-pay | Admitting: Vascular Surgery

## 2019-01-23 ENCOUNTER — Ambulatory Visit (HOSPITAL_COMMUNITY)
Admission: RE | Admit: 2019-01-23 | Discharge: 2019-01-23 | Disposition: A | Discharge status: Home / Self Care | Payer: Medicare Other | Attending: Vascular Surgery | Admitting: Vascular Surgery

## 2019-01-23 ENCOUNTER — Telehealth: Payer: Self-pay | Admitting: *Deleted

## 2019-01-23 ENCOUNTER — Other Ambulatory Visit: Payer: Self-pay

## 2019-01-23 DIAGNOSIS — Z992 Dependence on renal dialysis: Secondary | ICD-10-CM | POA: Diagnosis not present

## 2019-01-23 DIAGNOSIS — M1990 Unspecified osteoarthritis, unspecified site: Secondary | ICD-10-CM | POA: Diagnosis not present

## 2019-01-23 DIAGNOSIS — K219 Gastro-esophageal reflux disease without esophagitis: Secondary | ICD-10-CM | POA: Insufficient documentation

## 2019-01-23 DIAGNOSIS — M3500 Sicca syndrome, unspecified: Secondary | ICD-10-CM | POA: Insufficient documentation

## 2019-01-23 DIAGNOSIS — T82858A Stenosis of vascular prosthetic devices, implants and grafts, initial encounter: Secondary | ICD-10-CM | POA: Insufficient documentation

## 2019-01-23 DIAGNOSIS — Z86718 Personal history of other venous thrombosis and embolism: Secondary | ICD-10-CM | POA: Diagnosis not present

## 2019-01-23 DIAGNOSIS — Z79899 Other long term (current) drug therapy: Secondary | ICD-10-CM | POA: Diagnosis not present

## 2019-01-23 DIAGNOSIS — Z95828 Presence of other vascular implants and grafts: Secondary | ICD-10-CM | POA: Insufficient documentation

## 2019-01-23 DIAGNOSIS — Y832 Surgical operation with anastomosis, bypass or graft as the cause of abnormal reaction of the patient, or of later complication, without mention of misadventure at the time of the procedure: Secondary | ICD-10-CM | POA: Insufficient documentation

## 2019-01-23 DIAGNOSIS — N186 End stage renal disease: Secondary | ICD-10-CM | POA: Insufficient documentation

## 2019-01-23 DIAGNOSIS — I132 Hypertensive heart and chronic kidney disease with heart failure and with stage 5 chronic kidney disease, or end stage renal disease: Secondary | ICD-10-CM | POA: Diagnosis not present

## 2019-01-23 DIAGNOSIS — J8410 Pulmonary fibrosis, unspecified: Secondary | ICD-10-CM | POA: Diagnosis not present

## 2019-01-23 DIAGNOSIS — T82898A Other specified complication of vascular prosthetic devices, implants and grafts, initial encounter: Secondary | ICD-10-CM

## 2019-01-23 DIAGNOSIS — Z9981 Dependence on supplemental oxygen: Secondary | ICD-10-CM | POA: Insufficient documentation

## 2019-01-23 DIAGNOSIS — Z8249 Family history of ischemic heart disease and other diseases of the circulatory system: Secondary | ICD-10-CM | POA: Diagnosis not present

## 2019-01-23 DIAGNOSIS — I5042 Chronic combined systolic (congestive) and diastolic (congestive) heart failure: Secondary | ICD-10-CM | POA: Insufficient documentation

## 2019-01-23 DIAGNOSIS — Z7982 Long term (current) use of aspirin: Secondary | ICD-10-CM | POA: Insufficient documentation

## 2019-01-23 HISTORY — PX: PERIPHERAL VASCULAR BALLOON ANGIOPLASTY: CATH118281

## 2019-01-23 LAB — POCT I-STAT 4, (NA,K, GLUC, HGB,HCT)
Glucose, Bld: 86 mg/dL (ref 70–99)
HCT: 33 % — ABNORMAL LOW (ref 39.0–52.0)
Hemoglobin: 11.2 g/dL — ABNORMAL LOW (ref 13.0–17.0)
Potassium: 4.7 mmol/L (ref 3.5–5.1)
Sodium: 136 mmol/L (ref 135–145)

## 2019-01-23 SURGERY — PERIPHERAL VASCULAR BALLOON ANGIOPLASTY
Anesthesia: LOCAL | Laterality: Right

## 2019-01-23 MED ORDER — LIDOCAINE HCL (PF) 1 % IJ SOLN
INTRAMUSCULAR | Status: DC | PRN
  Administered 2019-01-23: 5 mL

## 2019-01-23 MED ORDER — FENTANYL CITRATE (PF) 100 MCG/2ML IJ SOLN
INTRAMUSCULAR | Status: DC | PRN
  Administered 2019-01-23: 25 ug via INTRAVENOUS

## 2019-01-23 MED ORDER — ONDANSETRON HCL 4 MG/2ML IJ SOLN: 4.0000 mg | Freq: Four times a day (QID) | INTRAMUSCULAR | Status: DC | PRN

## 2019-01-23 MED ORDER — MORPHINE SULFATE (PF) 10 MG/ML IV SOLN: 2.0000 mg | INTRAVENOUS | Status: DC | PRN

## 2019-01-23 MED ORDER — IODIXANOL 320 MG/ML IV SOLN
INTRAVENOUS | Status: DC | PRN
  Administered 2019-01-23: 70 mL

## 2019-01-23 MED ORDER — FENTANYL CITRATE (PF) 100 MCG/2ML IJ SOLN
INTRAMUSCULAR | Status: AC
  Filled 2019-01-23: qty 2

## 2019-01-23 MED ORDER — LIDOCAINE HCL (PF) 1 % IJ SOLN
INTRAMUSCULAR | Status: AC
  Filled 2019-01-23: qty 30

## 2019-01-23 MED ORDER — HEPARIN (PORCINE) IN NACL 1000-0.9 UT/500ML-% IV SOLN
INTRAVENOUS | Status: DC | PRN
  Administered 2019-01-23: 500 mL

## 2019-01-23 MED ORDER — SODIUM CHLORIDE 0.9% FLUSH: 3.0000 mL | Freq: Two times a day (BID) | INTRAVENOUS | Status: DC

## 2019-01-23 MED ORDER — MIDAZOLAM HCL 2 MG/2ML IJ SOLN
INTRAMUSCULAR | Status: AC
  Filled 2019-01-23: qty 2

## 2019-01-23 MED ORDER — HYDRALAZINE HCL 20 MG/ML IJ SOLN: 5.0000 mg | INTRAMUSCULAR | Status: DC | PRN

## 2019-01-23 MED ORDER — MIDAZOLAM HCL 2 MG/2ML IJ SOLN
INTRAMUSCULAR | Status: DC | PRN
  Administered 2019-01-23: 2 mg via INTRAVENOUS

## 2019-01-23 MED ORDER — SODIUM CHLORIDE 0.9% FLUSH: 3.0000 mL | INTRAVENOUS | Status: DC | PRN

## 2019-01-23 MED ORDER — LABETALOL HCL 5 MG/ML IV SOLN: 10.0000 mg | INTRAVENOUS | Status: DC | PRN

## 2019-01-23 MED ORDER — SODIUM CHLORIDE 0.9 % IV SOLN: 250.0000 mL | INTRAVENOUS | Status: DC | PRN

## 2019-01-23 MED ORDER — HEPARIN (PORCINE) IN NACL 1000-0.9 UT/500ML-% IV SOLN
INTRAVENOUS | Status: AC
  Filled 2019-01-23: qty 500

## 2019-01-23 MED ORDER — HEPARIN SODIUM (PORCINE) 1000 UNIT/ML IJ SOLN
INTRAMUSCULAR | Status: DC | PRN
  Administered 2019-01-23: 5000 [IU] via INTRAVENOUS

## 2019-01-23 MED ORDER — ACETAMINOPHEN 325 MG PO TABS: 650.0000 mg | ORAL_TABLET | ORAL | Status: DC | PRN

## 2019-01-23 MED ORDER — OXYCODONE HCL 5 MG PO TABS: 5.0000 mg | ORAL_TABLET | ORAL | Status: DC | PRN

## 2019-01-23 SURGICAL SUPPLY — 23 items
BAG SNAP BAND KOVER 36X36 (MISCELLANEOUS) ×2 IMPLANT
BALLN ATLAS 14X40X75 (BALLOONS) ×2
BALLN MUSTANG 12X60X75 (BALLOONS) ×2
BALLN MUSTANG 6.0X40 75 (BALLOONS) ×2
BALLN MUSTANG 8X60X75 (BALLOONS) ×2
BALLOON ATLAS 14X40X75 (BALLOONS) IMPLANT
BALLOON MUSTANG 12X60X75 (BALLOONS) IMPLANT
BALLOON MUSTANG 6.0X40 75 (BALLOONS) IMPLANT
BALLOON MUSTANG 8X60X75 (BALLOONS) IMPLANT
CATH ANGIO 5F BER2 65CM (CATHETERS) ×1 IMPLANT
COVER DOME SNAP 22 D (MISCELLANEOUS) ×2 IMPLANT
GUIDEWIRE ANGLED .035X150CM (WIRE) ×1 IMPLANT
KIT ENCORE 26 ADVANTAGE (KITS) ×1 IMPLANT
KIT MICROPUNCTURE NIT STIFF (SHEATH) ×1 IMPLANT
PROTECTION STATION PRESSURIZED (MISCELLANEOUS) ×2
SHEATH PINNACLE R/O II 7F 4CM (SHEATH) ×1 IMPLANT
SHEATH PROBE COVER 6X72 (BAG) ×1 IMPLANT
STATION PROTECTION PRESSURIZED (MISCELLANEOUS) ×1 IMPLANT
STOPCOCK MORSE 400PSI 3WAY (MISCELLANEOUS) ×2 IMPLANT
TRAY PV CATH (CUSTOM PROCEDURE TRAY) ×2 IMPLANT
TUBING CIL FLEX 10 FLL-RA (TUBING) ×2 IMPLANT
WIRE HITORQ VERSACORE ST 145CM (WIRE) ×1 IMPLANT
WIRE TORQFLEX AUST .018X40CM (WIRE) ×1 IMPLANT

## 2019-01-23 NOTE — Telephone Encounter (Signed)
Documentation for future care plan

## 2019-01-23 NOTE — Discharge Instructions (Signed)
AV Fistula Placement, Care After  This sheet gives you information about how to care for yourself after your procedure. Your health care provider may also give you more specific instructions. If you have problems or questions, contact your health care provider.  What can I expect after the procedure?  After the procedure, it is common to:  · Feel sore.  · Feel a vibration (thrill) over the fistula.  Follow these instructions at home:  Incision care         · Follow instructions from your health care provider about how to take care of your incision. Make sure you:  ? Wash your hands with soap and water before and after you change your bandage (dressing). If soap and water are not available, use hand sanitizer.  ? Change your dressing as told by your health care provider.  ? Leave stitches (sutures), skin glue, or adhesive strips in place. These skin closures may need to stay in place for 2 weeks or longer. If adhesive strip edges start to loosen and curl up, you may trim the loose edges. Do not remove adhesive strips completely unless your health care provider tells you to do that.  Fistula care  · Check your fistula site every day to make sure the thrill feels the same.  · Check your fistula site every day for signs of infection. Check for:  ? Redness, swelling, or pain.  ? Fluid or blood.  ? Warmth.  ? Pus or a bad smell.  · Raise (elevate) the affected area above the level of your heart while you are sitting or lying down.  · Do not lift anything that is heavier than 10 lb (4.5 kg), or the limit that you are told, until your health care provider says that it is safe.  · Do not lie down on your fistula arm.  · Do not let anyone draw blood or take a blood pressure reading on your fistula arm. This is important.  · Do not wear tight jewelry or clothing over your fistula arm.  Bathing  · Do not take baths, swim, or use a hot tub until your health care provider approves. Ask your health care provider if you may take  showers. You may only be allowed to take sponge baths.  · Keep the area around your incision clean and dry.  Medicines  · Take over-the-counter and prescription medicines only as told by your health care provider.  · Ask your health care provider if any medicine prescribed to you can cause constipation. You may need to take steps to prevent or treat constipation, such as:  ? Drink enough fluid to keep your urine pale yellow.  ? Take over-the-counter or prescription medicines.  ? Eat foods that are high in fiber, such as beans, whole grains, and fresh fruits and vegetables.  ? Limit foods that are high in fat and processed sugars, such as fried or sweet foods.  General instructions  · Rest at home for a day or two.  · Return to your normal activities as told by your health care provider. Ask your health care provider what activities are safe for you.  · Keep all follow-up visits as told by your health care provider. This is important.  Contact a health care provider if:  · You have redness, swelling, or pain around your fistula site.  · Your fistula site feels warm to the touch.  · You have pus or a bad smell coming from your   fistula site.  · You have a fever.  · You have numbness or coldness at your fistula site.  · You feel a decrease or a change in the thrill.  Get help right away if you:  · Are bleeding from your fistula site.  · Have chest pain.  · Have trouble breathing.  Summary  · Follow instructions from your health care provider about how to take care of your incision.  · Do not let anyone draw blood or take a blood pressure reading on your fistula arm. This is important.  · Return to your normal activities as told by your health care provider. Ask your health care provider what activities are safe for you.  · Contact a health care provider if you have a change in the thrill or have any signs of infection at your fistula site.  · Keep all follow-up visits as told by your health care provider. This is  important.  This information is not intended to replace advice given to you by your health care provider. Make sure you discuss any questions you have with your health care provider.  Document Released: 08/30/2005 Document Revised: 03/06/2018 Document Reviewed: 03/06/2018  Elsevier Interactive Patient Education © 2019 [employer] Inc[employer]

## 2019-01-23 NOTE — Telephone Encounter (Signed)
-----   Message from Elam Dutch, MD sent at 01/23/2019  1:17 PM EDT ----- Procedure: Right arm shuntogram angioplasty right innominate vein (14 x 6 Atlas balloon)  Patient now has a patent right innominate vein.  His arm swelling should subside over the next few weeks.  His graft is ready for use when necessary.  If the patient develops recurrent arm swelling symptoms he would be a candidate again for percutaneous intervention.  Ruta Hinds, MD [facility] [place] Office: [phone] Pager: [phone]

## 2019-01-23 NOTE — Interval H&P Note (Signed)
History and Physical Interval Note:  01/23/2019 11:11 AM  Travis [name]  has presented today for surgery, with the diagnosis of breakdown of a fistula.  The various methods of treatment have been discussed with the patient and family. After consideration of risks, benefits and other options for treatment, the patient has consented to  Procedure(s): A/V SHUNTOGRAM (Right) as a surgical intervention.  The patient's history has been reviewed, patient examined, no change in status, stable for surgery.  I have reviewed the patient's chart and labs.  Questions were answered to the patient's satisfaction.     Ruta Hinds

## 2019-01-23 NOTE — Op Note (Signed)
Procedure: Right arm shuntogram angioplasty right innominate vein (14 x 6 Atlas balloon)  Preoperative diagnosis: Right arm venous hypertension  Postoperative diagnosis: Same  Anesthesia: Local with IV sedation  Operative findings:  1.  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  Patent right forearm AV graft no significant outflow narrowing no significant arterial narrowing  Operative details: After team informed consent, the patient taken the [facility].  The patient was placed in supine position the angios table.  Patient's right arm was prepped and draped in usual sterile fashion.  Ultrasound was used to identify the patient's venous limb of a right forearm graft.  This was on the ulnar aspect of the forearm.  Local anesthesia was infiltrated over this.  Ultrasound confirmed that the graft was patent.  A micropuncture needle was used to successfully catheterize the graft and the micropuncture wire advanced into the graft.  Micropuncture sheath was then placed over this.  Contrast angiogram was then obtained through the micropuncture sheath.  The superior vena cava is patent.  There is an 80% narrowing of the innominate vein over the length of its course extending from the superior vena cava all the way to the confluence of the internal jugular and right subclavian vein.  The remainder of the venous limb of the graft has mild narrowing at the anastomotic area only about 30 to 40%.  At this point I decided to intervene on the right innominate vein stenosis.  An 035 versa core wire was advanced across the lesion using catheter support from a Berenstein catheter.  Micropuncture sheath had been swapped out over the guidewire for an 7 [place] short sheath.  The patient was given 5000 units of intravenous heparin.  The guidewire was advanced across  the lesion and down into the inferior vena cava.  Next using sequential dilation starting with a 6 x 40 followed by an 8 x 60 followed by a 12 x 60 and finally a 14 x 60 Atlas balloon.  Patient has significantly improved venous outflow at this point with residual less than 20% stenosis.  The patient was experiencing some chest discomfort with inflation of the 14 balloon so I did not feel safe going higher level than this.  At this point the 8 mm balloon was brought back down into the venous limb of the graft and inflated gently.  We then refluxed contrast across the arterial anastomosis.  This was widely patent.  At this point the guidewire and catheter was removed.  The sheath exit site was closed with a Monocryl figure-of-eight stitch.  Hemostasis was obtained with direct pressure.  The patient tolerated procedure well and there were no complications.  Patient was taken to the holding area in stable condition.  Operative management: Patient now has a patent right innominate vein.  His arm swelling should subside over the next few weeks.  His graft is ready for use when necessary.  If the patient develops recurrent arm swelling symptoms he would be a candidate again for percutaneous intervention.  Ruta Hinds, MD [facility] [place] Office: [phone] Pager: [phone]

## 2019-01-23 NOTE — Progress Notes (Signed)
Discharge instructions reviewed with pt wife( via telephone)  and pt. Voices understanding.

## 2019-01-24 ENCOUNTER — Encounter (HOSPITAL_COMMUNITY): Payer: Self-pay | Admitting: Vascular Surgery

## 2019-02-20 ENCOUNTER — Ambulatory Visit (INDEPENDENT_AMBULATORY_CARE_PROVIDER_SITE_OTHER): Payer: Medicare Other | Admitting: Podiatry

## 2019-02-20 ENCOUNTER — Encounter: Payer: Self-pay | Admitting: Podiatry

## 2019-02-20 ENCOUNTER — Other Ambulatory Visit: Payer: Self-pay

## 2019-02-20 DIAGNOSIS — M79674 Pain in right toe(s): Secondary | ICD-10-CM | POA: Diagnosis not present

## 2019-02-20 DIAGNOSIS — B351 Tinea unguium: Secondary | ICD-10-CM | POA: Diagnosis not present

## 2019-02-20 DIAGNOSIS — M79675 Pain in left toe(s): Secondary | ICD-10-CM | POA: Diagnosis not present

## 2019-02-20 NOTE — Patient Instructions (Signed)

## 2019-03-01 NOTE — Progress Notes (Signed)
Subjective:  Travis [name] presents to clinic today with cc of  painful, thick, discolored, elongated toenails 1-5 b/l that become tender and cannot cut because of thickness.  Pain is aggravated when wearing enclosed shoe gear.  He states he is being worked up for a kidney transplant at the [facility] hospital.  [name], MD is his PCP.   Current Outpatient Medications:  .  ARTIFICIAL TEAR SOLUTION OP, Place 1 drop into both eyes every evening., Disp: , Rfl:  .  aspirin EC 81 MG EC tablet, Take 1 tablet (81 mg total) by mouth daily., Disp: 30 tablet, Rfl: 0 .  atorvastatin (LIPITOR) 40 MG tablet, Take 1 tablet (40 mg total) by mouth daily at 6 PM. (Patient taking differently: Take 40 mg by mouth at bedtime. ), Disp: 30 tablet, Rfl: 0 .  cyanocobalamin 500 MCG tablet, Take 1 tablet (500 mcg total) by mouth daily., Disp: 30 tablet, Rfl: 0 .  gabapentin (NEURONTIN) 300 MG capsule, Take 300 mg by mouth 2 (two) times a day., Disp: , Rfl:  .  hydroxychloroquine (PLAQUENIL) 200 MG tablet, Take 200 mg by mouth daily. , Disp: , Rfl:  .  omeprazole (PRILOSEC) 20 MG capsule, Take 20 mg by mouth daily. , Disp: , Rfl: 11 .  OXYGEN, Inhale 2 L into the lungs daily., Disp: , Rfl:  .  PRESCRIPTION MEDICATION, Apply 1 application topically daily as needed (dry skin). Hydrocortisone mixed with eucerin, Disp: , Rfl:  .  sevelamer carbonate (RENVELA) 800 MG tablet, Take 2 tablets (1,600 mg total) by mouth 3 (three) times daily with meals. (Patient taking differently: Take 2,400 mg by mouth 3 (three) times daily with meals. ), Disp: 90 tablet, Rfl: 0 .  White Petrolatum-Mineral Oil (SOOTHE NIGHTTIME OP), Place 1 drop into both eyes at bedtime., Disp: , Rfl:    No Known Allergies   Objective: There were no vitals filed for this visit.  Physical Examination:  Vascular Examination: Capillary refill time immediate x 10 digits.  Palpable DP pulses b/l.  PT pulses nonpalpable b/l.  Digital hair absent  b/l.  No edema noted b/l.  Skin temperature gradient WNL b/l.  Dermatological Examination: Skin atrophied b/l LE.  No open wounds b/l.  No interdigital macerations noted b/l.  Elongated, thick, discolored brittle toenails with subungual debris and pain on dorsal palpation of nailbeds 1-5 b/l.  Incurvated, friable, crumbly and loose nail plate left great toe extending to nailbed.   Musculoskeletal Examination: Muscle strength 5/5 to all muscle groups b/l  No pain, crepitus or joint discomfort with active/passive ROM.  Neurological Examination: Sensation intact 5/5 b/l with 10 gram monofilament.  Vibratory sensation diminished b/l.  Assessment: Mycotic nail infection with pain 1-5 b/l  Plan: 1. Toenails 1-5 b/l were debrided in length and girth without iatrogenic laceration. Left great toe nail plate debrided to level of adherence. Patient's wife instructed to apply triple antibiotic ointment to left great toe once daily for one week. 2.  Continue soft, supportive shoe gear daily. 3.  Report any pedal injuries to medical professional. 4.  Follow up 3 months. 5.  Patient/POA to call should there be a question/concern in there interim.

## 2019-04-04 DIAGNOSIS — N289 Disorder of kidney and ureter, unspecified: Secondary | ICD-10-CM | POA: Insufficient documentation

## 2019-04-04 DIAGNOSIS — E8779 Other fluid overload: Secondary | ICD-10-CM | POA: Insufficient documentation

## 2019-05-29 ENCOUNTER — Encounter: Payer: Self-pay | Admitting: Podiatry

## 2019-05-29 ENCOUNTER — Ambulatory Visit (INDEPENDENT_AMBULATORY_CARE_PROVIDER_SITE_OTHER): Payer: Medicare Other | Admitting: Podiatry

## 2019-05-29 ENCOUNTER — Other Ambulatory Visit: Payer: Self-pay

## 2019-05-29 DIAGNOSIS — M79675 Pain in left toe(s): Secondary | ICD-10-CM

## 2019-05-29 DIAGNOSIS — I739 Peripheral vascular disease, unspecified: Secondary | ICD-10-CM

## 2019-05-29 DIAGNOSIS — M79674 Pain in right toe(s): Secondary | ICD-10-CM

## 2019-05-29 DIAGNOSIS — B351 Tinea unguium: Secondary | ICD-10-CM | POA: Diagnosis not present

## 2019-05-29 NOTE — Patient Instructions (Signed)

## 2019-05-30 NOTE — Progress Notes (Signed)
Subjective: Travis [name] is seen today for follow up painful, elongated, thickened toenails b/l feet that he cannot cut. Pain interferes with daily activities. Aggravating factor includes wearing enclosed shoe gear and relieved with periodic debridement.  Patient states he did not get accepted for his kidney transplant and he is disappointed, but hasn't given up yet.  He continues dialysis on MWF.  Vibratory sensation diminished b/l.  Protective sensation intact 5/5 b/l with 10 gram monofilament.  Objective:  Neurovascular status unchanged: Vascular Examination: Capillary refill time immediate x 10 digits.  Dorsalis pedis present b/l.  Posterior tibial pulses absent b/l.  Digital hair absent b/l.  Skin temperature gradient WNL b/l.  Dermatological Examination: Skin atrophied b/l.  Toenails 1-4 right, 2-4 left discolored, thick, dystrophic with subungual debris and pain with palpation to nailbeds due to thickness of nails.  Anonychia b/l 5th digits with evidence of permanent total nail avulsion. Nailbed(s) completely epithelialized and intact.  Onycholysis of left hallux nail with underlying nailbed intact. No erythema, no edema, no drainage, no flocculence.  No open wounds b/l.  Musculoskeletal: Muscle strength 5/5 to all LE muscle groups.  No gross bony deformities b/l.  No pain, crepitus or joint limitation noted with ROM.   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 Toenails 1-4 right, 2-4 left were debrided in length and girth without iatrogenic bleeding. 2. Patient to continue soft, supportive shoe gear 3. Patient to report any pedal injuries to medical professional immediately. 4. Follow up 3 months.  5. Patient/POA to call should there be a concern in the interim.

## 2019-07-02 DIAGNOSIS — T7840XA Allergy, unspecified, initial encounter: Secondary | ICD-10-CM | POA: Insufficient documentation

## 2019-08-28 ENCOUNTER — Encounter: Payer: Self-pay | Admitting: Podiatry

## 2019-08-28 ENCOUNTER — Ambulatory Visit: Payer: Medicare Other | Admitting: Podiatry

## 2019-08-28 ENCOUNTER — Other Ambulatory Visit: Payer: Self-pay

## 2019-08-28 DIAGNOSIS — B351 Tinea unguium: Secondary | ICD-10-CM | POA: Diagnosis not present

## 2019-08-28 DIAGNOSIS — I739 Peripheral vascular disease, unspecified: Secondary | ICD-10-CM | POA: Diagnosis not present

## 2019-08-28 DIAGNOSIS — M79674 Pain in right toe(s): Secondary | ICD-10-CM

## 2019-08-28 DIAGNOSIS — N186 End stage renal disease: Secondary | ICD-10-CM

## 2019-08-28 DIAGNOSIS — M79675 Pain in left toe(s): Secondary | ICD-10-CM

## 2019-08-28 DIAGNOSIS — Z992 Dependence on renal dialysis: Secondary | ICD-10-CM

## 2019-09-08 NOTE — Progress Notes (Signed)
Subjective:  Travis [name] presents to clinic today for preventative foot care. He has h/o ESRD on hemodialysis and PAD. He presents  with cc of  painful, thick, discolored, elongated toenails of both feet that become tender and patient cannot cut because of thickness. Pain is aggravated when wearing enclosed shoe gear and relieved with periodic professional debridement.  Patient voices no new pedal concerns on today's visit.  His dialysis days are MWF.   Medications reviewed in chart.  Allergies  Allergen Reactions  . Flu Virus Vaccine Other (See Comments)    Objective: 78 y.o. AAM, thin build, in NAD. AAO x 3.   Physical Examination:  Vascular Examination: Capillary refill time immediate b/l.  Palpable DP pulses b/l.  Nonpalpable PT pulses b/l.  Digital hair absent b/l.   No edema noted b/l.  Skin temperature gradient WNL b/l.  Dermatological Examination: Skin thin and atrophic b/l.  No open wounds b/l.  No interdigital macerations noted b/l.  Elongated, thick, discolored brittle toenails with subungual debris and pain on dorsal palpation of nailbeds 1-4 b/l.  Anonychia b/l 5th toes. Nailbed(s) completely epithelialized and intact.  Musculoskeletal Examination: Muscle strength 5/5 to all muscle groups b/l.  No pain, crepitus or joint discomfort with active/passive ROM.  Neurological Examination: Sensation intact 5/5 b/l with 10 gram monofilament.  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 Toenails 1-4 b/l were debrided in length and girth without iatrogenic laceration. 2.  Continue soft, supportive shoe gear daily. 3.  Report any pedal injuries to medical professional. 4.  Follow up 3 months. 5.  Patient/POA to call should there be a question/concern in there interim.

## 2019-11-27 ENCOUNTER — Other Ambulatory Visit: Payer: Self-pay

## 2019-11-27 ENCOUNTER — Ambulatory Visit: Payer: Self-pay | Admitting: Podiatry

## 2019-11-27 ENCOUNTER — Ambulatory Visit (INDEPENDENT_AMBULATORY_CARE_PROVIDER_SITE_OTHER): Payer: Medicare Other | Admitting: Podiatry

## 2019-11-27 ENCOUNTER — Encounter: Payer: Self-pay | Admitting: Podiatry

## 2019-11-27 VITALS — Temp 96.2°F

## 2019-11-27 DIAGNOSIS — M79675 Pain in left toe(s): Secondary | ICD-10-CM | POA: Diagnosis not present

## 2019-11-27 DIAGNOSIS — M79674 Pain in right toe(s): Secondary | ICD-10-CM

## 2019-11-27 DIAGNOSIS — B351 Tinea unguium: Secondary | ICD-10-CM

## 2019-11-27 DIAGNOSIS — I739 Peripheral vascular disease, unspecified: Secondary | ICD-10-CM | POA: Diagnosis not present

## 2019-11-27 DIAGNOSIS — N186 End stage renal disease: Secondary | ICD-10-CM

## 2019-11-27 DIAGNOSIS — Z992 Dependence on renal dialysis: Secondary | ICD-10-CM

## 2019-11-27 NOTE — Patient Instructions (Signed)

## 2019-12-02 NOTE — Progress Notes (Signed)
Subjective: Travis [name] presents today for follow up of at risk foot care. Patient has history of ESRD on hemodialysis and painful mycotic nails b/l that are difficult to trim. Pain interferes with ambulation. Aggravating factors include wearing enclosed shoe gear. Pain is relieved with periodic professional debridement.   Dialysis Schedule: MWF  He voices no new pedal concerns on today's visit.  Allergies  Allergen Reactions  . Flu Virus Vaccine Other (See Comments)     Objective: Vitals:   11/27/19 0920  Temp: (!) 96.2 F (35.7 C)    Pt 79 y.o. year old AA male WD, WN in NAD. AAO x 3.   Vascular Examination:  Capillary fill time to digits <3 seconds b/l. Palpable DP pulses b/l. Nonpalpable PT pulses b/l. Pedal hair absent b/l Skin temperature gradient within normal limits b/l. No pain with calf compression b/l.  Dermatological Examination: Pedal skin is thin shiny, atrophic bilaterally. No open wounds bilaterally. No interdigital macerations bilaterally. Toenails L hallux, L 2nd toe, L 3rd toe, L 4th toe, R hallux, R 2nd toe, R 3rd toe and R 4th toe elongated, dystrophic, thickened, and crumbly with subungual debris and tenderness to dorsal palpation. Anonychia noted L 5th toe and R 5th toe. Nailbed(s) epithelialized.   Musculoskeletal: Normal muscle strength 5/5 to all lower extremity muscle groups bilaterally, no gross bony deformities bilaterally and no pain crepitus or joint limitation noted with ROM b/l  Neurological: Protective sensation intact 5/5 intact bilaterally with 10g monofilament b/l Vibratory sensation intact b/l  Assessment: 1. Pain due to onychomycosis of toenails of both feet   2. PAD (peripheral artery disease) ([place])   3. ESRD on dialysis [facility])    Plan: -Toenails 1-5 b/l were debrided in length and girth with sterile nail nippers and dremel without iatrogenic bleeding.  -Patient to continue soft, supportive shoe gear daily. -Patient to report any  pedal injuries to medical professional immediately. -Patient/POA to call should there be question/concern in the interim.  Return in about 3 months (around 02/27/2020) for nail trim.

## 2020-02-12 ENCOUNTER — Ambulatory Visit: Payer: Self-pay | Admitting: Podiatry

## 2020-02-26 ENCOUNTER — Ambulatory Visit: Payer: Medicare Other | Admitting: Podiatry

## 2020-02-26 ENCOUNTER — Other Ambulatory Visit: Payer: Self-pay

## 2020-02-26 ENCOUNTER — Encounter: Payer: Self-pay | Admitting: Podiatry

## 2020-02-26 DIAGNOSIS — N186 End stage renal disease: Secondary | ICD-10-CM

## 2020-02-26 DIAGNOSIS — M79675 Pain in left toe(s): Secondary | ICD-10-CM | POA: Diagnosis not present

## 2020-02-26 DIAGNOSIS — B351 Tinea unguium: Secondary | ICD-10-CM

## 2020-02-26 DIAGNOSIS — M79674 Pain in right toe(s): Secondary | ICD-10-CM

## 2020-02-26 DIAGNOSIS — Z992 Dependence on renal dialysis: Secondary | ICD-10-CM

## 2020-02-26 DIAGNOSIS — I739 Peripheral vascular disease, unspecified: Secondary | ICD-10-CM

## 2020-03-02 NOTE — Progress Notes (Signed)
Subjective: Travis [name] is a pleasant 79 y.o. male patient seen today at risk foot care. Patient has h/o ESRD on hemodialysis and painful mycotic nails b/l that are difficult to trim. Pain interferes with ambulation. Aggravating factors include wearing enclosed shoe gear. Pain is relieved with periodic professional debridement.  Past Medical History:  Diagnosis Date  . Anemia   . Arthritis   . Chronic combined systolic and diastolic CHF (congestive heart failure) ([place])    a. Etiology of low EF not defined.  . Chronic respiratory failure ([place])   . DVT (deep venous thrombosis) ([place])    a. 01/2015: RLE DVT. VQ scan intermediate probability. Underwent renal bx complicated by perinephric hematoma; anticoagulation stopped and IVC filter placed.  Marland Kitchen ESRD on hemodialysis [facility]) June 2016   a. 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 Received plasma exchange and steroids but didn't respond and ended up starting hemodialysis June 2016.   Marland Kitchen Essential hypertension   . GERD (gastroesophageal reflux disease)   . Perinephric hematoma 01/2015  . Pneumonia   . 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 Protein calorie malnutrition ([place])   . Pulmonary fibrosis ([place])   . Sjogren's disease ([place]) 01/28/2015  . Syncope    a. 04/2015 in HD in setting of BP 50s.  . Unintentional weight loss   . Wears partial dentures     Patient Active Problem List   Diagnosis Date Noted  . Closed fracture of left hip with routine healing 12/23/2016  . Neuropathic pain 06/22/2016  . Pericardial effusion 11/06/2015  . Chronic systolic CHF (congestive heart failure), NYHA class 2 ([place]) 11/05/2015  . Sepsis ([place]) 11/05/2015  . SIRS (systemic inflammatory response syndrome) ([place]) 11/05/2015  . Chest pain on breathing   . Thrombocytopenia ([place]) 08/11/2015  . FTT (failure to thrive) in adult 08/11/2015  . Cough   . Rectal pain 07/28/2015  . Fecal impaction ([place]) 07/28/2015  . Rectal bleeding 07/28/2015   . Chronic respiratory failure ([place]) 07/28/2015  . Lower gastrointestinal bleed   . Pulmonary edema 05/14/2015  . Hypotension of hemodialysis 04/25/2015  . Acute systolic CHF (congestive heart failure) ([place]) 04/25/2015  . Protein-calorie malnutrition, severe ([place]) 04/19/2015  . NSTEMI (non-ST elevated myocardial infarction) ([place]) 04/18/2015  . ESRD on dialysis ([place])   . Acute renal failure syndrome ([place])   . AKI (acute kidney injury) ([place])   . Palliative care encounter   . Acute on chronic renal failure ([place]) 01/31/2015  . Acute DVT of right tibial vein ([place]) 01/29/2015  . Acute kidney injury ([place]) 01/29/2015  . DVT (deep venous thrombosis) ([place]) 01/29/2015  . Acute on chronic respiratory failure with hypoxia ([place]) 01/28/2015  . Physical deconditioning 01/28/2015  . Sjogren's disease ([place]) 01/28/2015  . Pedal edema 01/28/2015  . Peripheral edema 01/07/2015  . HCAP (healthcare-associated pneumonia) 01/06/2015  . Steroid-induced hyperglycemia 01/06/2015  . Dyspnea   . 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 Knee pain   . Shoulder pain   . 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 Anasarca 12/17/2014  . Fever 12/17/2014  . Elevated blood pressure 12/17/2014  . Normocytic anemia 12/17/2014    Current Outpatient Medications on File Prior to Visit  Medication Sig Dispense Refill  . ARTIFICIAL TEAR SOLUTION OP Place 1 drop into both eyes every evening.    Marland Kitchen aspirin EC 81 MG EC tablet Take 1 tablet (81 mg total) by mouth daily. 30 tablet 0  . atorvastatin (LIPITOR) 40 MG tablet  Take 1 tablet (40 mg total) by mouth daily at 6 PM. (Patient taking differently: Take 40 mg by mouth at bedtime. ) 30 tablet 0  . Cinacalcet HCl (SENSIPAR PO) Take by mouth.    . cyanocobalamin 500 MCG tablet Take 1 tablet (500 mcg total) by mouth daily. 30 tablet 0  . gabapentin (NEURONTIN) 300 MG capsule Take 300 mg by mouth 2 (two) times a day.    . hydroxychloroquine (PLAQUENIL) 200 MG tablet Take 200  mg by mouth daily.     . Methoxy PEG-Epoetin Beta (MIRCERA IJ) Mircera    . multivitamin (RENA-VIT) TABS tablet Take by mouth.    Marland Kitchen omeprazole (PRILOSEC) 20 MG capsule Take 20 mg by mouth daily.   11  . OXYGEN Inhale 2 L into the lungs daily.    Marland Kitchen PRESCRIPTION MEDICATION Apply 1 application topically daily as needed (dry skin). Hydrocortisone mixed with eucerin    . sevelamer carbonate (RENVELA) 800 MG tablet Take 2 tablets (1,600 mg total) by mouth 3 (three) times daily with meals. (Patient taking differently: Take 2,400 mg by mouth 3 (three) times daily with meals. ) 90 tablet 0  . White Petrolatum-Mineral Oil (SOOTHE NIGHTTIME OP) Place 1 drop into both eyes at bedtime.     No current facility-administered medications on file prior to visit.    Allergies  Allergen Reactions  . Flu Virus Vaccine Other (See Comments)    Objective: Physical Exam  General: Travis [name] is a pleasant 79 y.o. AAM male, WD, WN in NAD. AAO x 3.   Vascular:  Capillary fill time to digits <3 seconds b/l lower extremities. Palpable DP pulse(s) b/l lower extremities Nonpalpable PT pulse(s) b/l lower extremities. Pedal hair absent. Lower extremity skin temperature gradient within normal limits.  Dermatological:  Pedal skin is thin shiny, atrophic b/l lower extremities. No open wounds bilaterally. No interdigital macerations bilaterally. Toenails L hallux, L 2nd toe, L 3rd toe, L 4th toe, R hallux, R 2nd toe, R 3rd toe and R 4th toe elongated, discolored, dystrophic, thickened, and crumbly with subungual debris and tenderness to dorsal palpation. Anonychia noted L 5th toe and R 5th toe. Nailbed(s) epithelialized.   Musculoskeletal:  Normal muscle strength 5/5 to all lower extremity muscle groups bilaterally. No pain crepitus or joint limitation noted with ROM b/l. No gross bony deformities bilaterally.  Neurological:  Protective sensation intact 5/5 intact bilaterally with 10g monofilament b/l. Vibratory  sensation intact b/l.  Assessment and Plan:  1. Pain due to onychomycosis of toenails of both feet   2. ESRD on dialysis ([place])   3. PAD (peripheral artery disease) ([place])    -Examined patient. -No new findings. No new orders. -Toenails 1-5 b/l were debrided in length and girth with sterile nail nippers and dremel without iatrogenic bleeding.  -Patient to report any pedal injuries to medical professional immediately. -Patient to continue soft, supportive shoe gear daily. -Patient/POA to call should there be question/concern in the interim.  Return in about 3 months (around 05/28/2020) for nail trim.  Marzetta Board, DPM

## 2020-06-03 ENCOUNTER — Ambulatory Visit: Payer: Medicare Other | Admitting: Podiatry

## 2020-06-11 ENCOUNTER — Ambulatory Visit (INDEPENDENT_AMBULATORY_CARE_PROVIDER_SITE_OTHER): Payer: Medicare Other | Admitting: Podiatry

## 2020-06-11 ENCOUNTER — Encounter: Payer: Self-pay | Admitting: Podiatry

## 2020-06-11 ENCOUNTER — Other Ambulatory Visit: Payer: Self-pay

## 2020-06-11 DIAGNOSIS — N186 End stage renal disease: Secondary | ICD-10-CM

## 2020-06-11 DIAGNOSIS — M79674 Pain in right toe(s): Secondary | ICD-10-CM

## 2020-06-11 DIAGNOSIS — I739 Peripheral vascular disease, unspecified: Secondary | ICD-10-CM

## 2020-06-11 DIAGNOSIS — Z992 Dependence on renal dialysis: Secondary | ICD-10-CM

## 2020-06-11 DIAGNOSIS — M79675 Pain in left toe(s): Secondary | ICD-10-CM

## 2020-06-11 DIAGNOSIS — B351 Tinea unguium: Secondary | ICD-10-CM

## 2020-06-12 NOTE — Progress Notes (Signed)
Subjective: Travis [name] is a pleasant 79 y.o. male patient seen today at risk foot care. Patient has h/o ESRD on hemodialysis and painful mycotic nails b/l that are difficult to trim. Pain interferes with ambulation. Aggravating factors include wearing enclosed shoe gear. Pain is relieved with periodic professional debridement.   Today, he relates left great toe is a little bit sore in the corner. He states he has just come from dialysis and feels a little tired today. He also did not bring his cane.  Past Medical History:  Diagnosis Date  . Anemia   . Arthritis   . Chronic combined systolic and diastolic CHF (congestive heart failure) ([place])    a. Etiology of low EF not defined.  . Chronic respiratory failure ([place])   . DVT (deep venous thrombosis) ([place])    a. 01/2015: RLE DVT. VQ scan intermediate probability. Underwent renal bx complicated by perinephric hematoma; anticoagulation stopped and IVC filter placed.  Marland Kitchen ESRD on hemodialysis [facility]) June 2016   a. 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 Received plasma exchange and steroids but didn't respond and ended up starting hemodialysis June 2016.   Marland Kitchen Essential hypertension   . GERD (gastroesophageal reflux disease)   . Perinephric hematoma 01/2015  . Pneumonia   . 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 Protein calorie malnutrition ([place])   . 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 Sjogren's disease ([place]) 01/28/2015  . Syncope    a. 04/2015 in HD in setting of BP 50s.  . Unintentional weight loss   . Wears partial dentures     Patient Active Problem List   Diagnosis Date Noted  . Allergy, unspecified, initial encounter 07/02/2019  . Other fluid overload 04/04/2019  . Closed fracture of left hip with routine healing 12/23/2016  . Neuropathic pain 06/22/2016  . 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 Pericardial effusion 11/06/2015  . Chronic systolic CHF (congestive heart failure), NYHA class 2 ([place]) 11/05/2015   . Sepsis ([place]) 11/05/2015  . SIRS (systemic inflammatory response syndrome) ([place]) 11/05/2015  . Chest pain on breathing   . Thrombocytopenia ([place]) 08/11/2015  . FTT (failure to thrive) in adult 08/11/2015  . Cough   . Rectal pain 07/28/2015  . Fecal impaction ([place]) 07/28/2015  . Rectal bleeding 07/28/2015  . Chronic respiratory failure ([place]) 07/28/2015  . Lower gastrointestinal bleed   . Pulmonary edema 05/14/2015  . Hypotension of hemodialysis 04/25/2015  . Acute systolic CHF (congestive heart failure) ([place]) 04/25/2015  . Protein-calorie malnutrition, severe ([place]) 04/19/2015  . NSTEMI (non-ST elevated myocardial infarction) ([place]) 04/18/2015  . Other abnormal glucose 02/21/2015  . 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  . Iron deficiency anemia, unspecified 02/20/2015  . Other specified coagulation defects ([place]) 02/20/2015  . Pruritus, unspecified 02/20/2015  . 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 ESRD on dialysis ([place])   . Acute renal failure syndrome ([place])   . AKI (acute kidney injury) ([place])   . Palliative care encounter   . Acute on chronic renal failure ([place]) 01/31/2015  . Acute DVT of right tibial vein ([place]) 01/29/2015  . Acute kidney injury ([place]) 01/29/2015  . DVT (deep venous thrombosis) ([place]) 01/29/2015  . 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  . 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 Acute on chronic respiratory failure with hypoxia ([place]) 01/28/2015  . Physical deconditioning 01/28/2015  . Sjogren's disease ([place]) 01/28/2015  . Pedal edema 01/28/2015  . Peripheral edema 01/07/2015  . HCAP (healthcare-associated pneumonia) 01/06/2015  .  Steroid-induced hyperglycemia 01/06/2015  . Dyspnea   . 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 Knee pain   . Shoulder pain   . 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 Anasarca 12/17/2014  . Fever 12/17/2014  . Elevated blood pressure 12/17/2014  . Normocytic anemia 12/17/2014     Current Outpatient Medications on File Prior to Visit  Medication Sig Dispense Refill  . Cinacalcet HCl (SENSIPAR PO) Take by mouth.    . clotrimazole (LOTRIMIN) 1 % cream Apply topically.    Marland Kitchen amoxicillin (AMOXIL) 500 MG capsule SMARTSIG:4 Capsule(s) By Mouth    . ARTIFICIAL TEAR SOLUTION OP Place 1 drop into both eyes every evening.    Marland Kitchen aspirin EC 81 MG EC tablet Take 1 tablet (81 mg total) by mouth daily. 30 tablet 0  . atorvastatin (LIPITOR) 40 MG tablet Take 1 tablet (40 mg total) by mouth daily at 6 PM. (Patient taking differently: Take 40 mg by mouth at bedtime. ) 30 tablet 0  . clotrimazole (LOTRIMIN) 1 % cream SMARTSIG:Sparingly Topical Twice Daily    . clotrimazole-betamethasone (LOTRISONE) cream Apply topically 2 (two) times daily.    . cyanocobalamin 500 MCG tablet Take 1 tablet (500 mcg total) by mouth daily. 30 tablet 0  . gabapentin (NEURONTIN) 300 MG capsule Take 300 mg by mouth 2 (two) times a day.    . hydroxychloroquine (PLAQUENIL) 200 MG tablet Take 200 mg by mouth daily.     . Methoxy PEG-Epoetin Beta (MIRCERA IJ) Mircera    . multivitamin (RENA-VIT) TABS tablet Take by mouth.    Marland Kitchen omeprazole (PRILOSEC) 20 MG capsule Take 20 mg by mouth daily.   11  . OXYGEN Inhale 2 L into the lungs daily.    Marland Kitchen PRESCRIPTION MEDICATION Apply 1 application topically daily as needed (dry skin). Hydrocortisone mixed with eucerin    . sevelamer carbonate (RENVELA) 800 MG tablet Take 2 tablets (1,600 mg total) by mouth 3 (three) times daily with meals. (Patient taking differently: Take 2,400 mg by mouth 3 (three) times daily with meals. ) 90 tablet 0  . White Petrolatum-Mineral Oil (SOOTHE NIGHTTIME OP) Place 1 drop into both eyes at bedtime.     No current facility-administered medications on file prior to visit.    Allergies  Allergen Reactions  . Flu Virus Vaccine Other (See Comments)    Objective: Physical Exam  General: Travis [name] is a pleasant 79 y.o. AAM male, WD, WN  in NAD. AAO x 3.   Vascular:  Capillary fill time to digits <3 seconds b/l lower extremities. Palpable DP pulse(s) b/l lower extremities Nonpalpable PT pulse(s) b/l lower extremities. Pedal hair absent. Lower extremity skin temperature gradient within normal limits. No ischemia or gangrene noted b/l lower extremities.  Dermatological:  Pedal skin is thin shiny, atrophic b/l lower extremities. No open wounds bilaterally. No interdigital macerations bilaterally. Toenails L hallux, L 2nd toe, L 3rd toe, L 4th toe, R hallux, R 2nd toe, R 3rd toe and R 4th toe elongated, discolored, dystrophic, thickened, and crumbly with subungual debris and tenderness to dorsal palpation. Anonychia noted L 5th toe and R 5th toe. Nailbed(s) epithelialized.    Left great toe medial border incurvated with tenderness to palpation. There is no erythema, no edema, no drainage, no fluctuance noted.  Musculoskeletal:  Normal muscle strength 5/5 to all lower extremity muscle groups bilaterally. No pain crepitus or joint limitation noted with ROM b/l. No gross bony deformities bilaterally.  Neurological:  Protective sensation intact 5/5  intact bilaterally with 10g monofilament b/l. Vibratory sensation intact b/l.  Assessment and Plan:  1. Pain due to onychomycosis of toenails of both feet   2. ESRD on dialysis ([place])   3. PAD (peripheral artery disease) ([place])    -Examined patient. -No new findings. No new orders. -Toenails 1-5 b/l were debrided in length and girth with sterile nail nippers and dremel without iatrogenic bleeding. Offending nail border of the left great toe was debrided and curettage without complication. Border was cleansed with alcohol and triple antibiotic ointment was applied. No further treatment required by patient. -After today's visit, patient was escorted back to his vehicle in care of his wife via wheelchair. -Patient to report any pedal injuries to medical professional immediately. -Patient to  continue soft, supportive shoe gear daily. -Patient/POA to call should there be question/concern in the interim.  Return in about 3 months (around 09/10/2020).  Marzetta Board, DPM

## 2020-09-16 ENCOUNTER — Ambulatory Visit: Payer: Medicare Other | Admitting: Podiatry

## 2020-09-18 DIAGNOSIS — R269 Unspecified abnormalities of gait and mobility: Secondary | ICD-10-CM | POA: Diagnosis not present

## 2020-09-18 DIAGNOSIS — J8410 Pulmonary fibrosis, unspecified: Secondary | ICD-10-CM | POA: Diagnosis not present

## 2020-09-18 DIAGNOSIS — R0902 Hypoxemia: Secondary | ICD-10-CM | POA: Diagnosis not present

## 2020-10-03 ENCOUNTER — Other Ambulatory Visit: Payer: Self-pay

## 2020-10-03 ENCOUNTER — Ambulatory Visit (INDEPENDENT_AMBULATORY_CARE_PROVIDER_SITE_OTHER): Payer: Medicare Other | Admitting: Vascular Surgery

## 2020-10-03 ENCOUNTER — Encounter: Payer: Self-pay | Admitting: Vascular Surgery

## 2020-10-03 VITALS — BP 199/96 | Temp 98.0°F | Resp 20 | Ht 70.0 in | Wt 170.6 lb

## 2020-10-03 DIAGNOSIS — N186 End stage renal disease: Secondary | ICD-10-CM | POA: Diagnosis not present

## 2020-10-03 NOTE — Progress Notes (Signed)
Patient ID: Travis [name], male   DOB: October 30, 1940, 80 y.o.   MRN: [id number]  Reason for Consult: Follow-up   Referred by Travis Amel, MD  Subjective:     HPI:  Travis [name] is a 80 y.o. male on dialysis Monday Wednesday and Friday via right arm AV graft.  He was sent with his image today for consideration of evaluation and treatment of his arterial inflow of his graft.  Patient dialyzed earlier he states without issue.  He is not having any bleeding from the graft.  His hand is free of symptoms.  Overall he has no complaints related to today's visit.  Past Medical History:  Diagnosis Date  . Anemia   . Arthritis   . Chronic combined systolic and diastolic CHF (congestive heart failure) ([place])    a. Etiology of low EF not defined.  . Chronic respiratory failure ([place])   . DVT (deep venous thrombosis) ([place])    a. 01/2015: RLE DVT. VQ scan intermediate probability. Underwent renal bx complicated by perinephric hematoma; anticoagulation stopped and IVC filter placed.  Travis [name] ESRD on hemodialysis [facility]) June 2016   a. 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 Received plasma exchange and steroids but didn't respond and ended up starting hemodialysis June 2016.   Travis [name] Essential hypertension   . GERD (gastroesophageal reflux disease)   . Perinephric hematoma 01/2015  . Pneumonia   . 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 Protein calorie malnutrition ([place])   . Pulmonary fibrosis ([place])   . Sjogren's disease ([place]) 01/28/2015  . Syncope    a. 04/2015 in HD in setting of BP 50s.  . Unintentional weight loss   . Wears partial dentures    Family History  Problem Relation Age of Onset  . Hypertension Mother   . Healthy Father   . Hypertension Sister   . Hypertension Brother    Past Surgical History:  Procedure Laterality Date  . AV FISTULA PLACEMENT Right 02/17/2015   Procedure: INSERTION OF RIGHT ARM  ARTERIOVENOUS (AV) GORE-TEX GRAFT ;  Surgeon: Elam Dutch, MD;   Location: [place];  Service: Vascular;  Laterality: Right;  . FLEXIBLE SIGMOIDOSCOPY N/A 08/04/2015   Procedure: FLEXIBLE SIGMOIDOSCOPY;  Surgeon: Wilford Corner, MD;  Location[facility] ENDOSCOPY;  Service: Endoscopy;  Laterality: N/A;  . [place]  . INSERTION OF DIALYSIS CATHETER N/A 02/17/2015   Procedure: INSERTION OF DIALYSIS CATHETER RIGHT INTERNAL JUGULAR VEIN;  Surgeon: Elam Dutch, MD;  Location: [place];  Service: Vascular;  Laterality: N/A;  . MULTIPLE TOOTH EXTRACTIONS    . PERIPHERAL VASCULAR BALLOON ANGIOPLASTY Right 01/23/2019   Procedure: PERIPHERAL VASCULAR BALLOON ANGIOPLASTY;  Surgeon: Elam Dutch, MD;  Location: [place] CV LAB;  Service: Cardiovascular;  Laterality: Right;  . REVISION OF ARTERIOVENOUS GORETEX GRAFT Right 03/29/2017   Procedure: REVISION OF ARTERIOVENOUS GORETEX GRAFT;  Surgeon: Conrad Eads, MD;  Location: [facility] OR;  Service: Vascular;  Laterality: Right;  . Surgical procedure to remove a mole as a child Right Eye area   At around 28 years old    Short Social History:  Social History   Tobacco Use  . Smoking status: Never Smoker  . Smokeless tobacco: Never Used  Substance Use Topics  . Alcohol use: No    Alcohol/week: 0.0 standard drinks    Allergies  Allergen Reactions  . Influenza Virus Vaccine Other (See Comments)    Current Outpatient Medications  Medication Sig Dispense Refill  .  amoxicillin (AMOXIL) 500 MG capsule SMARTSIG:4 Capsule(s) By Mouth    . ARTIFICIAL TEAR SOLUTION OP Place 1 drop into both eyes every evening.    Travis [name] aspirin EC 81 MG EC tablet Take 1 tablet (81 mg total) by mouth daily. 30 tablet 0  . atorvastatin (LIPITOR) 40 MG tablet Take 1 tablet (40 mg total) by mouth daily at 6 PM. (Patient taking differently: Take 40 mg by mouth at bedtime.) 30 tablet 0  . Cinacalcet HCl (SENSIPAR PO) Take by mouth.    . clotrimazole (LOTRIMIN) 1 % cream Apply topically.    . clotrimazole (LOTRIMIN) 1 % cream  SMARTSIG:Sparingly Topical Twice Daily    . clotrimazole-betamethasone (LOTRISONE) cream Apply topically 2 (two) times daily.    . cyanocobalamin 500 MCG tablet Take 1 tablet (500 mcg total) by mouth daily. 30 tablet 0  . gabapentin (NEURONTIN) 300 MG capsule Take 300 mg by mouth 2 (two) times a day.    . hydroxychloroquine (PLAQUENIL) 200 MG tablet Take 200 mg by mouth daily.     . Methoxy PEG-Epoetin Beta (MIRCERA IJ) Mircera    . multivitamin (RENA-VIT) TABS tablet Take by mouth.    Travis [name] omeprazole (PRILOSEC) 20 MG capsule Take 20 mg by mouth daily.   11  . OXYGEN Inhale 2 L into the lungs daily.    Travis [name] PRESCRIPTION MEDICATION Apply 1 application topically daily as needed (dry skin). Hydrocortisone mixed with eucerin    . sevelamer carbonate (RENVELA) 800 MG tablet Take 2 tablets (1,600 mg total) by mouth 3 (three) times daily with meals. (Patient taking differently: Take 2,400 mg by mouth 3 (three) times daily with meals.) 90 tablet 0  . White Petrolatum-Mineral Oil (SOOTHE NIGHTTIME OP) Place 1 drop into both eyes at bedtime.     No current facility-administered medications for this visit.    Review of Systems  Constitutional:  Constitutional negative. HENT: HENT negative.  Eyes: Eyes negative.  Respiratory: Respiratory negative.  Cardiovascular: Cardiovascular negative.  GI: Gastrointestinal negative.  Musculoskeletal:       Right upper extremity swelling Skin: Skin negative.  Neurological: Neurological negative. Hematologic: Hematologic/lymphatic negative.  Psychiatric: Psychiatric negative.        Objective:  Objective   Vitals:   10/03/20 1401  BP: (!) 199/96  Resp: 20  Temp: 98 F (36.7 C)  Weight: 170 lb 10.2 oz (77.4 kg)  Height: '5\' 10"'$  (1.778 m)   Body mass index is 24.48 kg/m.  Physical Exam HENT:     Head: Normocephalic.     Nose:     Comments: Wearing a mask Cardiovascular:     Pulses: Normal pulses.  Abdominal:     General: Abdomen is flat.   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     Capillary Refill: Capillary refill takes less than 2 seconds.  Neurological:     General: No focal deficit present.     Mental Status: He is alert.  Psychiatric:        Mood and Affect: Mood normal.        Behavior: Behavior normal.        Thought Content: Thought content normal.        Judgment: Judgment normal.     Data: I reviewed his recent upper extremity fistulogram  which demonstrates narrowing of the arterial limb into his pseudoaneurysm.  The outflow is mostly stented in the forearm up to the mid upper arm.  He also has an innominate vein stent on the right.     Assessment/Plan:     80 year old male here for evaluation possible treatment of arterial inflow of his forearm AV graft.  The graft is working for dialysis and was used earlier today and he is not having any prolonged bleeding.  After reviewing the images and discussing with the patient he would prefer not to have any procedures that are absolutely necessary.  I am also somewhat concerned that any intervention may cause thrombosis of this graft given the amount of stents and previous revision.  At this time patient will follow-up on an as-needed basis if he needs any intervention.     Waynetta Sandy MD [facility]

## 2020-10-16 ENCOUNTER — Other Ambulatory Visit: Payer: Self-pay | Admitting: Physician Assistant

## 2020-10-16 ENCOUNTER — Emergency Department (HOSPITAL_COMMUNITY)
Admission: EM | Admit: 2020-10-16 | Discharge: 2020-10-16 | Disposition: A | Discharge status: Home / Self Care | Payer: No Typology Code available for payment source | Attending: Emergency Medicine | Admitting: Emergency Medicine

## 2020-10-16 ENCOUNTER — Emergency Department (HOSPITAL_COMMUNITY): Payer: No Typology Code available for payment source

## 2020-10-16 DIAGNOSIS — Z79899 Other long term (current) drug therapy: Secondary | ICD-10-CM | POA: Diagnosis not present

## 2020-10-16 DIAGNOSIS — R079 Chest pain, unspecified: Secondary | ICD-10-CM | POA: Diagnosis present

## 2020-10-16 DIAGNOSIS — R011 Cardiac murmur, unspecified: Secondary | ICD-10-CM

## 2020-10-16 DIAGNOSIS — I5042 Chronic combined systolic (congestive) and diastolic (congestive) heart failure: Secondary | ICD-10-CM | POA: Diagnosis not present

## 2020-10-16 DIAGNOSIS — R0602 Shortness of breath: Secondary | ICD-10-CM | POA: Diagnosis not present

## 2020-10-16 DIAGNOSIS — Z992 Dependence on renal dialysis: Secondary | ICD-10-CM | POA: Insufficient documentation

## 2020-10-16 DIAGNOSIS — R778 Other specified abnormalities of plasma proteins: Secondary | ICD-10-CM | POA: Diagnosis not present

## 2020-10-16 DIAGNOSIS — N186 End stage renal disease: Secondary | ICD-10-CM | POA: Diagnosis not present

## 2020-10-16 DIAGNOSIS — I132 Hypertensive heart and chronic kidney disease with heart failure and with stage 5 chronic kidney disease, or end stage renal disease: Secondary | ICD-10-CM | POA: Diagnosis not present

## 2020-10-16 DIAGNOSIS — I208 Other forms of angina pectoris: Secondary | ICD-10-CM

## 2020-10-16 DIAGNOSIS — R7989 Other specified abnormal findings of blood chemistry: Secondary | ICD-10-CM | POA: Insufficient documentation

## 2020-10-16 DIAGNOSIS — R072 Precordial pain: Secondary | ICD-10-CM | POA: Diagnosis not present

## 2020-10-16 DIAGNOSIS — Z7982 Long term (current) use of aspirin: Secondary | ICD-10-CM | POA: Insufficient documentation

## 2020-10-16 DIAGNOSIS — R0789 Other chest pain: Secondary | ICD-10-CM | POA: Diagnosis not present

## 2020-10-16 LAB — CBC
HCT: 40.3 % (ref 39.0–52.0)
Hemoglobin: 11.8 g/dL — ABNORMAL LOW (ref 13.0–17.0)
MCH: 28.2 pg (ref 26.0–34.0)
MCHC: 29.3 g/dL — ABNORMAL LOW (ref 30.0–36.0)
MCV: 96.2 fL (ref 80.0–100.0)
Platelets: 185 10*3/uL (ref 150–400)
RBC: 4.19 MIL/uL — ABNORMAL LOW (ref 4.22–5.81)
RDW: 17.6 % — ABNORMAL HIGH (ref 11.5–15.5)
WBC: 6.9 10*3/uL (ref 4.0–10.5)
nRBC: 0 % (ref 0.0–0.2)

## 2020-10-16 LAB — BASIC METABOLIC PANEL
Anion gap: 13 (ref 5–15)
BUN: 23 mg/dL (ref 8–23)
CO2: 31 mmol/L (ref 22–32)
Calcium: 9.6 mg/dL (ref 8.9–10.3)
Chloride: 94 mmol/L — ABNORMAL LOW (ref 98–111)
Creatinine, Ser: 7.81 mg/dL — ABNORMAL HIGH (ref 0.61–1.24)
GFR, Estimated: 7 mL/min — ABNORMAL LOW (ref >60)
Glucose, Bld: 97 mg/dL (ref 70–99)
Potassium: 5.1 mmol/L (ref 3.5–5.1)
Sodium: 138 mmol/L (ref 135–145)

## 2020-10-16 LAB — TROPONIN I (HIGH SENSITIVITY)
Troponin I (High Sensitivity): 24 ng/L — ABNORMAL HIGH (ref <18)
Troponin I (High Sensitivity): 65 ng/L — ABNORMAL HIGH (ref <18)

## 2020-10-16 NOTE — Consult Note (Addendum)
Cardiology Consultation:   Patient ID: Travis [name] MRN: [id number]; DOB: Oct 07, 1940  Admit date: 10/16/2020 Date of Consult: 10/16/2020  Primary Care Provider: [name], MD [facility] Cardiologist: Dr. Meda Coffee   Patient Profile:   Travis [name] is a 80 y.o. 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 Johnney Killian.   Hx of large pericardiac effusion in setting of server sepsis in 10/2015. Repeat echo 11/2015 & 08/2016 showed resolution of effusion.   Last seen by Dr. Meda Coffee 08/2016.   History of Present Illness:   Travis [name] was in usual state of health up until this morning around 8 AM when he had sudden onset left-sided chest pressure 10/10  radiated to his left shoulder/arm.  He does have chronic left shoulder problems due to arthritis.  Symptoms lasted 20 minutes with eventual reservation.  He take aspirin 81 mg x 3.  No recurrent chest pain. Denies associated shortness of breath, diaphoresis,  nausea or vomiting.  Family insisted him to come to ER.  Patient denies any prior episode of similar symptoms.  Mostly sedentary lifestyle secondary to trauma during [place] War to his right leg.  Denies orthopnea, PND, dizziness, palpitation or melena.  Compliant with his dialysis.  Yesterday his systolic blood pressure was in 70s during dialysis.  Today elevated. No fever, chills, cough or congestion.   High-sensitivity troponin 24>>65. Potassium 5.1 Creatinine 7.81  Chest x-ray: IMPRESSION: 1. Areas of subtle added density overlying the RIGHT upper lobe and lingula. Correlate with any signs of infection and  consider PA and lateral chest radiograph on follow-up. No lobar consolidation or effusion. 2. Cardiac silhouette less globular than on the prior exam in this patient with previous evidence of pericardial effusions on prior imaging. 3. Underlying COPD.  Past Medical History:  Diagnosis Date  . Anemia   . Arthritis   . Chronic combined systolic and diastolic CHF (congestive heart failure) ([place])    a. Etiology of low EF not defined.  . Chronic respiratory failure ([place])   . DVT (deep venous thrombosis) ([place])    a. 01/2015: RLE DVT. VQ scan intermediate probability. Underwent renal bx complicated by perinephric hematoma; anticoagulation stopped and IVC filter placed.  Marland Kitchen ESRD on hemodialysis [facility]) June 2016   a. 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 Received plasma exchange and steroids but didn't respond and ended up starting hemodialysis June 2016.   Marland Kitchen Essential hypertension   . GERD (gastroesophageal reflux disease)   . Perinephric hematoma 01/2015  . Pneumonia   . 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 Protein calorie malnutrition ([place])   . Pulmonary fibrosis ([place])   . Sjogren's disease ([place]) 01/28/2015  . Syncope    a. 04/2015 in HD in setting of BP 50s.  . Unintentional weight loss   . Wears partial dentures     Past Surgical History:  Procedure Laterality Date  . AV FISTULA PLACEMENT Right 02/17/2015   Procedure: INSERTION OF RIGHT ARM  ARTERIOVENOUS (AV) GORE-TEX GRAFT ;  Surgeon: Elam Dutch, MD;  Location: [place];  Service: Vascular;  Laterality: Right;  .  FLEXIBLE SIGMOIDOSCOPY N/A 08/04/2015   Procedure: FLEXIBLE SIGMOIDOSCOPY;  Surgeon: Wilford Corner, MD;  Location: [facility] ENDOSCOPY;  Service: Endoscopy;  Laterality: N/A;  . [place]  . INSERTION OF DIALYSIS CATHETER N/A 02/17/2015   Procedure: INSERTION OF DIALYSIS CATHETER RIGHT INTERNAL JUGULAR VEIN;  Surgeon: Elam Dutch, MD;  Location: [place];  Service: Vascular;  Laterality:  N/A;  . MULTIPLE TOOTH EXTRACTIONS    . PERIPHERAL VASCULAR BALLOON ANGIOPLASTY Right 01/23/2019   Procedure: PERIPHERAL VASCULAR BALLOON ANGIOPLASTY;  Surgeon: Elam Dutch, MD;  Location: [place] CV LAB;  Service: Cardiovascular;  Laterality: Right;  . REVISION OF ARTERIOVENOUS GORETEX GRAFT Right 03/29/2017   Procedure: REVISION OF ARTERIOVENOUS GORETEX GRAFT;  Surgeon: Conrad Louisburg, MD;  Location: [facility] OR;  Service: Vascular;  Laterality: Right;  . Surgical procedure to remove a mole as a child Right Eye area   At around 32 years old     Inpatient Medications: Scheduled Meds:  Continuous Infusions:  PRN Meds:   Allergies:    Allergies  Allergen Reactions  . Influenza Virus Vaccine Other (See Comments)    Social History:   Social History   Socioeconomic History  . Marital status: Married    Spouse name: Not on file  . Number of children: Not on file  . Years of education: Not on file  . Highest education level: Not on file  Occupational History  . Not on file  Tobacco Use  . Smoking status: Never Smoker  . Smokeless tobacco: Never Used  Vaping Use  . Vaping Use: Never used  Substance and Sexual Activity  . Alcohol use: No    Alcohol/week: 0.0 standard drinks  . Drug use: No  . Sexual activity: Not on file  Other Topics Concern  . Not on file  Social History Narrative  . Not on file   Social Determinants of Health   Financial Resource Strain: Not on file  Food Insecurity: Not on file  Transportation Needs: Not on file  Physical Activity: Not on file  Stress: Not on file  Social Connections: Not on file  Intimate Partner Violence: Not on file    Family History:   Family History  Problem Relation Age of Onset  . Hypertension Mother   . Healthy Father   . Hypertension Sister   . Hypertension Brother      ROS:  Please see the history of present illness.  All other ROS reviewed and negative.     Physical Exam/Data:   Vitals:   10/16/20  1330 10/16/20 1345 10/16/20 1430 10/16/20 1500  BP: (!) 185/71  (!) 185/68 (!) 187/66  Pulse: 62 (!) 59  60  Resp: '18 17 17 20  '$ Temp:      TempSrc:      SpO2: 100% 100%  100%  Weight:      Height:       No intake or output data in the 24 hours ending 10/16/20 1618 Last 3 Weights 10/16/2020 10/03/2020 01/23/2019  Weight (lbs) 170 lb 170 lb 10.2 oz 171 lb  Weight (kg) 77.111 kg 77.4 kg 77.565 kg     Body mass index is 24.39 kg/m.  General:  Well nourished, well developed, in no acute distress HEENT: normal Lymph: no adenopathy Neck: no JVD Endocrine:  No thryomegaly Vascular: No carotid bruits; FA pulses 2+ bilaterally without bruits  Cardiac:  normal S1, S2; RRR; + Murmur  Lungs:  clear to auscultation bilaterally, no wheezing, rhonchi or  rales  Abd: soft, nontender, no hepatomegaly  Ext: no edema Musculoskeletal:  No deformities, BUE and BLE strength normal and equal. Right arm fistula  Skin: warm and dry  Neuro:  CNs 2-12 intact, no focal abnormalities noted Psych:  Normal affect   EKG:  The EKG was personally reviewed and demonstrates:  SR with TWI in lead I and aVL which is somewhat more pronounced than prior Telemetry:  Telemetry was personally reviewed and demonstrates:  In ER  Relevant CV Studies:  Echo 08/2016 Study Conclusions   - Left ventricle: The cavity size was normal. Wall thickness was  increased in a pattern of moderate LVH. Systolic function was  normal. The estimated ejection fraction was in the range of 60%  to 65%. Wall motion was normal; there were no regional wall  motion abnormalities. Doppler parameters are consistent with  abnormal left ventricular relaxation (grade 1 diastolic  dysfunction).  - Aortic valve: There was trivial regurgitation.  - Ascending aorta: The ascending aorta was mildly dilated.  - Mitral valve: Calcified annulus. Mildly thickened leaflets .   Impressions:   - Normal LV systolic function; moderate LVH; trace  AI.   Laboratory Data:  High Sensitivity Troponin:   Recent Labs  Lab 10/16/20 1008 10/16/20 1203  TROPONINIHS 24* 65*     Chemistry Recent Labs  Lab 10/16/20 1008  NA 138  K 5.1  CL 94*  CO2 31  GLUCOSE 97  BUN 23  CREATININE 7.81*  CALCIUM 9.6  GFRNONAA 7*  ANIONGAP 13   Hematology Recent Labs  Lab 10/16/20 1008  WBC 6.9  RBC 4.19*  HGB 11.8*  HCT 40.3  MCV 96.2  MCH 28.2  MCHC 29.3*  RDW 17.6*  PLT 185   Radiology/Studies:  DG Chest 2 View  Result Date: 10/16/2020 CLINICAL DATA:  Chest pain in a 80 year old male. EXAM: CHEST - 2 VIEW COMPARISON:  November 18, 2015. FINDINGS: Lungs remain hyperinflated. Accounting for this the appearance of the cardiac silhouette is less globular than on the previous study. Trachea remains midline. Hilar structures are stable. Vascular stenting has occurred over the RIGHT subclavian vasculature since the previous study projecting over the RIGHT clavicle. Asymmetric density in the RIGHT upper chest in the RIGHT upper lobe medially. Subtle asymmetry in the area of the lingula. No lobar level consolidative changes. No effusion. On limited assessment no acute skeletal process. IMPRESSION: 1. Areas of subtle added density overlying the RIGHT upper lobe and lingula. Correlate with any signs of infection and consider PA and lateral chest radiograph on follow-up. No lobar consolidation or effusion. 2. Cardiac silhouette less globular than on the prior exam in this patient with previous evidence of pericardial effusions on prior imaging. 3. Underlying COPD. Electronically Signed   By: Zetta Bills M.D.   On: 10/16/2020 10:24     Assessment and Plan:   1. Chest pain and elevated troponin Patient with sudden onset left-sided chest pressure radiating to his arm.  Symptoms spontaneously resolved within 20 minutes.  Afterwards he took baby aspirin x3.  No recurrent episodes since then.  EKG with nonspecific ST changes in lead I and aVL which seems  more pronounced than prior.  High-sensitivity troponin 24>>65. He does have cardiac risk factors.  2. HTN - BP elevated currently - SBP in 70s with dialysis recently - Not on any antihypertensive at home   3. ESRD on HD (MWF) - Reports compliance  4. COPD - No active wheezing  Dr. Meda Coffee to  see. Patient wants to go home.   Shared Decision Making/Informed Consent The risks [chest pain, shortness of breath, cardiac arrhythmias, dizziness, blood pressure fluctuations, myocardial infarction, stroke/transient ischemic attack, nausea, vomiting, allergic reaction, radiation exposure, metallic taste sensation and life-threatening complications (estimated to be 1 in 10,000)], benefits (risk stratification, diagnosing coronary artery disease, treatment guidance) and alternatives of a nuclear stress test were discussed in detail with Mr. Marling and he agrees to proceed.  Risk Assessment/Risk Scores:  6}   HEAR Score (for undifferentiated chest pain):  HEAR Score: 5{  For questions or updates, please contact [place] Please consult [email or web address] for contact info under   Jarrett Soho, [place]  10/16/2020 4:18 PM    The patient was seen, examined and discussed with Bhagat,Bhavinkumar PA-C and I agree with the above.   80 y.o. 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 Hx of large pericardiac effusion in setting of server sepsis in 10/2015. Repeat echo 11/2015 & 08/2016 showed resolution of effusion.  He was last seen by me in December 2017 and afterwards followed in [place]. He states that his blood pressure has been  fluctuating with hypertension at baseline but then significant hypotension after dialysis down to 70 mmHg. He developed acute onset chest pain around 8 AM this morning when he felt left-sided chest pressure that radiated to his left arm.  This has resolved in about 20 minutes.  He came to the ER where he was found to be hypertensive, high-sensitivity troponin was minimally elevated 24 and then 65.  He is end-stage renal disease with creatinine 7.8.  He is chest x-ray did not show any signs of CHF.  He is EKG shows sinus rhythm with LVH and diffuse negative T waves in the lateral leads unchanged from prior.  On physical exam he has no JVDs, S1-S2 with 3 out of 6 systolic murmur and S2, his lungs are clear bilaterally, his extremities are warm and he is peripheral pulses are very strong.  Assessment and plan  Chest pain Hypertensive heart disease History of pericardial effusion Systolic murmur End-stage renal disease on hemodialysis.  We will arrange for outpatient stress test as well as echocardiogram to reevaluate possible valvular abnormalities, also pericardial effusion that was present in the past.  We will arrange that for next week this will be followed by outpatient clinic follow-up.  Patient agrees, he really wants to go home.  He can be discharged from the ER.  He is currently chest pain-free.  Ena Dawley, MD 10/16/2020

## 2020-10-16 NOTE — ED Notes (Signed)
Pt states BP is normally low with systolic running around 75 as of yesterday.

## 2020-10-16 NOTE — ED Notes (Signed)
Patient verbalizes understanding of discharge instructions. Opportunity for questioning and answers were provided. Armband removed by staff, pt discharged from ED.  

## 2020-10-16 NOTE — ED Triage Notes (Signed)
Pt arrives from home via [facility] with c/c of cp he is also a dialysis pt MWF. Pt has taken '324mg'$  asa and currently cp has resolved.

## 2020-10-16 NOTE — ED Provider Notes (Signed)
[facility] EMERGENCY DEPARTMENT Provider Note   CSN: [id number] Arrival date & time: 10/16/20  [id number]     History Chief Complaint  Patient presents with  . Chest Pain    Travis [name] is a 80 y.o. male.  HPI Patient reports that he awakened this morning and felt all right.  He got up and walked into the kitchen.  He reports once he did that he noticed he had pressure in his left chest and some radiation into the left shoulder.  It was uncomfortable.  He reports it did not immediately improve.  He took a baby aspirin.  When it persisted, he determined to come to the emergency department.  Patient reports it has resolved now.  He denies he was noticeably short of breath, no nausea or vomiting, no syncope.  Patient has Monday Wednesday Friday dialysis.  He reports during dialysis a lot of times his blood pressure fluctuates quite a bit and gets really low.  He reports he feels bad when his blood pressure gets low.  No recent cough, fever or chills.  No swelling or pain in the legs    Past Medical History:  Diagnosis Date  . Anemia   . Arthritis   . Chronic combined systolic and diastolic CHF (congestive heart failure) ([place])    a. Etiology of low EF not defined.  . Chronic respiratory failure ([place])   . DVT (deep venous thrombosis) ([place])    a. 01/2015: RLE DVT. VQ scan intermediate probability. Underwent renal bx complicated by perinephric hematoma; anticoagulation stopped and IVC filter placed.  Marland Kitchen ESRD on hemodialysis [facility]) June 2016   a. 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 Received plasma exchange and steroids but didn't respond and ended up starting hemodialysis June 2016.   Marland Kitchen Essential hypertension   . GERD (gastroesophageal reflux disease)   . Perinephric hematoma 01/2015  . Pneumonia   . 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 Protein calorie malnutrition ([place])   . Pulmonary fibrosis ([place])   . Sjogren's disease ([place]) 01/28/2015  .  Syncope    a. 04/2015 in HD in setting of BP 50s.  . Unintentional weight loss   . Wears partial dentures     Patient Active Problem List   Diagnosis Date Noted  . Allergy, unspecified, initial encounter 07/02/2019  . Other fluid overload 04/04/2019  . Closed fracture of left hip with routine healing 12/23/2016  . Neuropathic pain 06/22/2016  . 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 Pericardial effusion 11/06/2015  . Chronic systolic CHF (congestive heart failure), NYHA class 2 ([place]) 11/05/2015  . Sepsis ([place]) 11/05/2015  . SIRS (systemic inflammatory response syndrome) ([place]) 11/05/2015  . Chest pain on breathing   . Thrombocytopenia ([place]) 08/11/2015  . FTT (failure to thrive) in adult 08/11/2015  . Cough   . Rectal pain 07/28/2015  . Fecal impaction ([place]) 07/28/2015  . Rectal bleeding 07/28/2015  . Chronic respiratory failure ([place]) 07/28/2015  . Lower gastrointestinal bleed   . Pulmonary edema 05/14/2015  . Hypotension of hemodialysis 04/25/2015  . Acute systolic CHF (congestive heart failure) ([place]) 04/25/2015  . Protein-calorie malnutrition, severe ([place]) 04/19/2015  . NSTEMI (non-ST elevated myocardial infarction) ([place]) 04/18/2015  . Other abnormal glucose 02/21/2015  . 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  . Iron deficiency anemia, unspecified 02/20/2015  . Other specified coagulation defects ([place]) 02/20/2015  . Pruritus, unspecified 02/20/2015  . Secondary hyperparathyroidism of renal origin ([place]) 02/20/2015  .  ESRD on dialysis ([place])   . Acute renal failure syndrome ([place])   . AKI (acute kidney injury) ([place])   . Palliative care encounter   . Acute on chronic renal failure ([place]) 01/31/2015  . Acute DVT of right tibial vein ([place]) 01/29/2015  . Acute kidney injury ([place]) 01/29/2015  . DVT (deep venous thrombosis) ([place]) 01/29/2015  . 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  . 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 Acute on chronic respiratory  failure with hypoxia ([place]) 01/28/2015  . Physical deconditioning 01/28/2015  . Sjogren's disease ([place]) 01/28/2015  . Pedal edema 01/28/2015  . Peripheral edema 01/07/2015  . HCAP (healthcare-associated pneumonia) 01/06/2015  . Steroid-induced hyperglycemia 01/06/2015  . Dyspnea   . 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 Knee pain   . Shoulder pain   . 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 Anasarca 12/17/2014  . Fever 12/17/2014  . Elevated blood pressure 12/17/2014  . Normocytic anemia 12/17/2014    Past Surgical History:  Procedure Laterality Date  . AV FISTULA PLACEMENT Right 02/17/2015   Procedure: INSERTION OF RIGHT ARM  ARTERIOVENOUS (AV) GORE-TEX GRAFT ;  Surgeon: Elam Dutch, MD;  Location: [facility];  Service: Vascular;  Laterality: Right;  . FLEXIBLE SIGMOIDOSCOPY N/A 08/04/2015   Procedure: FLEXIBLE SIGMOIDOSCOPY;  Surgeon: Wilford Corner, MD;  Location[facility] ENDOSCOPY;  Service: Endoscopy;  Laterality: N/A;  . [place]  . INSERTION OF DIALYSIS CATHETER N/A 02/17/2015   Procedure: INSERTION OF DIALYSIS CATHETER RIGHT INTERNAL JUGULAR VEIN;  Surgeon: Elam Dutch, MD;  Location: [facility];  Service: Vascular;  Laterality: N/A;  . MULTIPLE TOOTH EXTRACTIONS    . PERIPHERAL VASCULAR BALLOON ANGIOPLASTY Right 01/23/2019   Procedure: PERIPHERAL VASCULAR BALLOON ANGIOPLASTY;  Surgeon: Elam Dutch, MD;  Location: [facility];  Service: Cardiovascular;  Laterality: Right;  . REVISION OF ARTERIOVENOUS GORETEX GRAFT Right 03/29/2017   Procedure: REVISION OF ARTERIOVENOUS GORETEX GRAFT;  Surgeon: Conrad Geneva, MD;  [facility];  Service: Vascular;  Laterality: Right;  . Surgical procedure to remove a mole as a child Right Eye area   At around 58 years old       Family History  Problem Relation Age of Onset  . Hypertension Mother   . Healthy Father   . Hypertension Sister   . Hypertension Brother     Social History   Tobacco Use  .  Smoking status: Never Smoker  . Smokeless tobacco: Never Used  Vaping Use  . Vaping Use: Never used  Substance Use Topics  . Alcohol use: No    Alcohol/week: 0.0 standard drinks  . Drug use: No    Home Medications Prior to Admission medications   Medication Sig Start Date End Date Taking? Authorizing Provider  amoxicillin (AMOXIL) 500 MG capsule SMARTSIG:4 Capsule(s) By Mouth 01/16/20   [provider]  ARTIFICIAL TEAR SOLUTION OP Place 1 drop into both eyes every evening.    [provider]  aspirin EC 81 MG EC tablet Take 1 tablet (81 mg total) by mouth daily. 04/23/15   Reyne Dumas, MD  atorvastatin (LIPITOR) 40 MG tablet Take 1 tablet (40 mg total) by mouth daily at 6 PM. Patient taking differently: Take 40 mg by mouth at bedtime. 04/23/15   Reyne Dumas, MD  Cinacalcet HCl (SENSIPAR PO) Take by mouth. 05/30/20 05/29/21  [provider]  clotrimazole (LOTRIMIN) 1 % cream Apply topically. 03/05/20   [provider]  clotrimazole (LOTRIMIN) 1 % cream SMARTSIG:Sparingly  Topical Twice Daily 03/05/20   [provider]  clotrimazole-betamethasone (LOTRISONE) cream Apply topically 2 (two) times daily. 04/22/20   [provider]  cyanocobalamin 500 MCG tablet Take 1 tablet (500 mcg total) by mouth daily. 12/26/14   Donne Hazel, MD  gabapentin (NEURONTIN) 300 MG capsule Take 300 mg by mouth 2 (two) times a day.    [provider]  hydroxychloroquine (PLAQUENIL) 200 MG tablet Take 200 mg by mouth daily.     [provider]  multivitamin (RENA-VIT) TABS tablet Take by mouth. 04/25/15   [provider]  omeprazole (PRILOSEC) 20 MG capsule Take 20 mg by mouth daily.  07/31/15   [provider]  OXYGEN Inhale 2 L into the lungs daily.    [provider]  PRESCRIPTION MEDICATION Apply 1 application topically daily as needed (dry skin). Hydrocortisone mixed with eucerin    [provider]   sevelamer carbonate (RENVELA) 800 MG tablet Take 2 tablets (1,600 mg total) by mouth 3 (three) times daily with meals. Patient taking differently: Take 2,400 mg by mouth 3 (three) times daily with meals. 05/17/15   Cherene Altes, MD  White Petrolatum-Mineral Oil (SOOTHE NIGHTTIME OP) Place 1 drop into both eyes at bedtime.    [provider]    Allergies    Influenza virus vaccine  Review of Systems   Review of Systems 10 systems reviewed and negative except as per HPI Physical Exam Updated Vital Signs BP (!) 187/66 (BP Location: Left Arm)   Pulse 60   Temp 98.4 F (36.9 C) (Oral)   Resp 20   Ht '5\' 10"'$  (1.778 m)   Wt 77.1 kg   SpO2 100%   BMI 24.39 kg/m   Physical Exam Constitutional:      Appearance: He is well-developed and well-nourished.     Comments: Alert and nontoxic.  Well in appearance.  No respiratory distress at rest  HENT:     Head: Normocephalic and atraumatic.  Eyes:     Extraocular Movements: Extraocular movements intact and EOM normal.  Cardiovascular:     Rate and Rhythm: Normal rate and regular rhythm.     Pulses: Intact distal pulses.     Heart sounds: Normal heart sounds.  Pulmonary:     Effort: Pulmonary effort is normal.     Breath sounds: Normal breath sounds.  Abdominal:     General: Bowel sounds are normal. There is no distension.     Palpations: Abdomen is soft.     Tenderness: There is no abdominal tenderness.  Musculoskeletal:        General: No edema. Normal range of motion.     Cervical back: Neck supple.     Comments: Dialysis fistula right upper extremity  Skin:    General: Skin is warm, dry and intact.  Neurological:     Mental Status: He is alert and oriented to person, place, and time.     GCS: GCS eye subscore is 4. GCS verbal subscore is 5. GCS motor subscore is 6.     Coordination: Coordination normal.     Deep Tendon Reflexes: Strength normal.  Psychiatric:        Mood and Affect: Mood and affect normal.      ED Results / Procedures / Treatments   Labs (all labs ordered are listed, but only abnormal results are displayed) Labs Reviewed  BASIC METABOLIC PANEL - Abnormal; Notable for the following components:      Result  Value   Chloride 94 (*)    Creatinine, Ser 7.81 (*)    GFR, Estimated 7 (*)    All other components within normal limits  CBC - Abnormal; Notable for the following components:   RBC 4.19 (*)    Hemoglobin 11.8 (*)    MCHC 29.3 (*)    RDW 17.6 (*)    All other components within normal limits  TROPONIN I (HIGH SENSITIVITY) - Abnormal; Notable for the following components:   Troponin I (High Sensitivity) 24 (*)    All other components within normal limits  TROPONIN I (HIGH SENSITIVITY) - Abnormal; Notable for the following components:   Troponin I (High Sensitivity) 65 (*)    All other components within normal limits    EKG EKG Interpretation  Date/Time:  Thursday October 16 2020 09:51:22 EST Ventricular Rate:  68 PR Interval:  204 QRS Duration: 94 QT Interval:  430 QTC Calculation: 457 R Axis:   -25 Text Interpretation: Normal sinus rhythm Moderate voltage criteria for LVH, may be normal variant ( R in aVL , Cornell product ) Nonspecific T wave abnormality Abnormal ECG long QT, repolarzation abnl ant slightly more than previous, no STEMI Confirmed by Charlesetta Shanks [id number]) on 10/16/2020 10:58:48 AM   Radiology DG Chest 2 View  Result Date: 10/16/2020 CLINICAL DATA:  Chest pain in a 80 year old male. EXAM: CHEST - 2 VIEW COMPARISON:  November 18, 2015. FINDINGS: Lungs remain hyperinflated. Accounting for this the appearance of the cardiac silhouette is less globular than on the previous study. Trachea remains midline. Hilar structures are stable. Vascular stenting has occurred over the RIGHT subclavian vasculature since the previous study projecting over the RIGHT clavicle. Asymmetric density in the RIGHT upper chest in the RIGHT upper lobe medially. Subtle asymmetry  in the area of the lingula. No lobar level consolidative changes. No effusion. On limited assessment no acute skeletal process. IMPRESSION: 1. Areas of subtle added density overlying the RIGHT upper lobe and lingula. Correlate with any signs of infection and consider PA and lateral chest radiograph on follow-up. No lobar consolidation or effusion. 2. Cardiac silhouette less globular than on the prior exam in this patient with previous evidence of pericardial effusions on prior imaging. 3. Underlying COPD. Electronically Signed   By: Zetta Bills M.D.   On: 10/16/2020 10:24    Procedures Procedures   Medications Ordered in ED Medications - No data to display  ED Course  I have reviewed the triage vital signs and the nursing notes.  Pertinent labs & imaging results that were available during my care of the patient were reviewed by me and considered in my medical decision making (see chart for details).    MDM Rules/Calculators/A&P                          Patient has multiple cardiac risk factors.  He had an episode this morning of left anterior chest pain radiating to the shoulder.  No syncope.  Significant shortness of breath.  Patient is a dialysis patient and maintains his dialysis schedule.  Troponin was mildly elevated at 24, repeat troponin up to 60s, with significant risk factors and typical quality of anginal chest pain, will plan for cardiology consult and repeat a third troponin.  Patient is otherwise well and on schedule with his dialysis.  No signs of acute infectious illness.  If troponin  flat and cleared by cardiology, anticipate discharge with outpatient follow-up. Final Clinical Impression(s) /  ED Diagnoses Final diagnoses:  Precordial chest pain    Rx / DC Orders ED Discharge Orders    None       Charlesetta Shanks, MD 10/16/20 [phone]

## 2020-10-16 NOTE — ED Provider Notes (Signed)
Pt was seen by cardiology, Dr. Meda Coffee.  She will arrange for close outpatient follow up with a stress test and an ECHO.  Pt wants to go home.  He is stable for d/c.  Return if worse.   Isla Pence, MD 10/16/20 [phone]

## 2020-10-17 ENCOUNTER — Other Ambulatory Visit: Payer: Self-pay | Admitting: Physician Assistant

## 2020-10-17 DIAGNOSIS — I208 Other forms of angina pectoris: Secondary | ICD-10-CM

## 2020-10-17 DIAGNOSIS — I214 Non-ST elevation (NSTEMI) myocardial infarction: Secondary | ICD-10-CM

## 2020-10-17 DIAGNOSIS — R011 Cardiac murmur, unspecified: Secondary | ICD-10-CM

## 2020-10-19 DIAGNOSIS — J8410 Pulmonary fibrosis, unspecified: Secondary | ICD-10-CM | POA: Diagnosis not present

## 2020-10-19 DIAGNOSIS — R0902 Hypoxemia: Secondary | ICD-10-CM | POA: Diagnosis not present

## 2020-10-19 DIAGNOSIS — R269 Unspecified abnormalities of gait and mobility: Secondary | ICD-10-CM | POA: Diagnosis not present

## 2020-10-21 ENCOUNTER — Telehealth: Payer: Self-pay | Admitting: Cardiology

## 2020-10-21 NOTE — Telephone Encounter (Signed)
Called to schedule hospital f/u, Lexiscan, and Echo per [employer]. [name] states they would like to have these tests performed at the [facility] instead of Dr. Francesca Oman office. Please advise.

## 2020-10-21 NOTE — Telephone Encounter (Signed)
Pt scheduled for lexiscan on 10/30/20, echo on 11/11/20, and post hospital follow-up appt with Richardson Dopp PA-C for 12/09/20 at 0815.  Pt made aware of appt date and times by Scheduling dept.

## 2020-10-21 NOTE — Telephone Encounter (Signed)
Called and spoke with the pt and [name], for Travis [name] is not currently on his DPR.  Informed the pt that he will need to have testing done here at the office, for safety and per protocol.  Both parties state they are ok doing testing and follow-up appt here, but it will need to be done on Tuesdays or Thursdays, for the pt has dialysis on M,W,F. Informed both parties that I will endorse this back to our schedulers to call them back, and arrange echo, lexiscan, and hospital follow-up appt for  Tuesday or Thursday.  Both the pt and [name] verbalized understanding and agrees with this plan.

## 2020-10-29 ENCOUNTER — Telehealth: Payer: Self-pay

## 2020-10-29 NOTE — Telephone Encounter (Signed)
Attempted to contact the patient twice. Both times the line was busy. Will try again. S.Willliams EMTP

## 2020-10-30 ENCOUNTER — Encounter (HOSPITAL_COMMUNITY): Payer: Self-pay | Admitting: Cardiology

## 2020-10-30 ENCOUNTER — Encounter (HOSPITAL_COMMUNITY): Payer: Medicare Other

## 2020-11-11 ENCOUNTER — Other Ambulatory Visit: Payer: Self-pay

## 2020-11-11 ENCOUNTER — Ambulatory Visit (HOSPITAL_COMMUNITY): Payer: Medicare Other | Attending: Cardiovascular Disease

## 2020-11-11 DIAGNOSIS — I214 Non-ST elevation (NSTEMI) myocardial infarction: Secondary | ICD-10-CM | POA: Insufficient documentation

## 2020-11-11 DIAGNOSIS — R011 Cardiac murmur, unspecified: Secondary | ICD-10-CM | POA: Insufficient documentation

## 2020-11-11 DIAGNOSIS — I208 Other forms of angina pectoris: Secondary | ICD-10-CM | POA: Insufficient documentation

## 2020-11-11 LAB — ECHOCARDIOGRAM COMPLETE
AR max vel: 1.71 cm2
AV Area VTI: 1.78 cm2
AV Area mean vel: 1.77 cm2
AV Mean grad: 12 mmHg
AV Peak grad: 23 mmHg
Ao pk vel: 2.4 m/s
Area-P 1/2: 2.69 cm2
S' Lateral: 3 cm

## 2020-11-12 ENCOUNTER — Telehealth: Payer: Self-pay | Admitting: Physician Assistant

## 2020-11-12 NOTE — Telephone Encounter (Signed)
Follow up:    Patient retuning a call concering some results.

## 2020-11-12 NOTE — Telephone Encounter (Signed)
The patient has been notified of the result and verbalized understanding.  All questions (if any) were answered. Nuala Alpha, LPN [id number] PM   Advised the pt to maintain good BP and Lipid control, continue his current med regimen, and reduce the sodium in his diet.  Pt verbalized understanding and agrees with this plan.

## 2020-11-12 NOTE — Telephone Encounter (Signed)
-----   Message from Dorothy Spark, MD sent at 11/11/2020  8:15 PM EST ----- He has normal LVEF [id number], grade 2 diastolic dysfunction - heart stiffness that can improve with good BP and lipid control as well as with exercise and low sodium diet. He also has mild aortic stenosis - no intervention needed, we will follow it

## 2020-11-13 ENCOUNTER — Telehealth (HOSPITAL_COMMUNITY): Payer: Self-pay | Admitting: Cardiology

## 2020-11-13 NOTE — Telephone Encounter (Signed)
I called patient to try to reschedule Myoview per your request.  I called and the Voicemail was full @ 9:10 am and unable to leave a message.

## 2020-11-13 NOTE — Telephone Encounter (Signed)
Can you please try calling him today?  Today is his off day from dialysis.  A lot of times I have to speak with his wife first then she gets him on the phone.  Thanks so much for informing us and helping me out.

## 2020-11-13 NOTE — Telephone Encounter (Signed)
Just an FYI. We have made several attempts to contact this patient including sending a letter to schedule or reschedule their Myoview. We will be removing the patient from the echo/nuc WQ.   10/30/20 NO SHOW- MAILED LETTER/LBW        Thank you

## 2020-11-13 NOTE — Telephone Encounter (Signed)
Travis [name], I just called his home number and his wife answered and has him right beside her.  Call this number [phone] (Home).  They are aware you will be calling back in next few to arrange stress test.  I am so sorry for the back and forth and thank you for all you do.

## 2020-11-16 DIAGNOSIS — J8410 Pulmonary fibrosis, unspecified: Secondary | ICD-10-CM | POA: Diagnosis not present

## 2020-11-16 DIAGNOSIS — R269 Unspecified abnormalities of gait and mobility: Secondary | ICD-10-CM | POA: Diagnosis not present

## 2020-11-16 DIAGNOSIS — R0902 Hypoxemia: Secondary | ICD-10-CM | POA: Diagnosis not present

## 2020-12-08 NOTE — Progress Notes (Signed)
Cardiology Office Note:    Date:  12/09/2020   ID:  Travis [name], DOB 1941-08-22, MRN [id number]  PCP:  [name], [place]  Cardiologist:  Freada Bergeron, MD  Advanced Practice Provider:  Liliane Shi, PA-C Electrophysiologist:  None       Referring MD: [name], MD   Chief Complaint:  Hospitalization Follow-up (ED visit with chest pain)    Patient Profile:    Travis [name] is a 80 y.o. 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   Pulmonary fibrosis   Sjogren's disease  (HFpEF) heart failure with preserved ejection fraction   Hx of large pericardial effusion in setting of sevre sepsis 10/2015 >> resolved on f/u echos   GERD   Hypertension   Hx of DVT in 5/16 s/p IVC filter  anticoagulation DCd 2/2 to pernephric hematoma   Chronic respiratory failure on home O2  COPD  Anemia   Hx of R leg trauma ([place] War combat injury)   Prior CV studies: Echocardiogram 11/11/20 EF 60-65, no RWMA, mild LH, Gr 2 DD, normal RVSF, mild LAE, trivial MR, mild to mod TR, mild AI, mild AS (mean 12 mmHg)     History of Present Illness:    Travis [name] was last seen in clinic by Dr. Meda Coffee in 2017. He has been followed at the [facility] since.  He was recently at [facility] ED by Dr. Meda Coffee on 10/16/20 for chest pain that lasted 20 min.  He had a recent hx of labile BPs (high but then dropping low in dialysis).  His hsTrop was minimally elevated but not c/w ACS.  He was set up for an OP echocardiogram and Myoview.  His echocardiogram on 11/11/20 demonstrated normal EF and mod diastolic dysfunction.  There was mild to mod TR, mild AI, mild AS.  He did not show for his Myoview.  Appt notes indicate he may have had a stress test at the [place] but no records are available for me to review.    He returns for f/u.   He is here with his wife.  Since going to the emergency room, he has not had any further chest  discomfort.  He does note that he did have a stress test at the [facility].  He believes that the results were normal.  We will request records.  He also describes a procedure that is going to be done at [facility] in the next few weeks.  He notes that he has a nodule in his left lung and they are going to do an imaging study to review everything from his abdomen up to his brain.  I am not certain what procedure he is referring to.  He does have significant fatigue.  He has not had syncope, significant shortness of breath, leg swelling.  His blood pressure sometimes drops extremely low during dialysis.     Past Medical History:  Diagnosis Date  . Anemia   . Arthritis   . Chronic combined systolic and diastolic CHF (congestive heart failure) ([place])    a. Etiology of low EF not defined.  . Chronic respiratory failure ([place])   . DVT (deep venous thrombosis) ([place])    a. 01/2015: RLE DVT. VQ scan intermediate probability. Underwent renal bx complicated by perinephric hematoma; anticoagulation stopped and IVC filter placed.  Marland Kitchen ESRD on hemodialysis [facility]) June 2016   a. 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 Received plasma exchange and steroids but didn't respond and ended up starting hemodialysis June 2016.   Marland Kitchen Essential hypertension   . GERD (gastroesophageal reflux disease)   . Perinephric hematoma 01/2015  . Pneumonia   . 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 Protein calorie malnutrition ([place])   . Pulmonary fibrosis ([place])   . Sjogren's disease ([place]) 01/28/2015  . Syncope    a. 04/2015 in HD in setting of BP 50s.  . Unintentional weight loss   . Wears partial dentures     Current Medications: Current Meds  Medication Sig  . amoxicillin (AMOXIL) 500 MG capsule SMARTSIG:4 Capsule(s) By Mouth  . ARTIFICIAL TEAR SOLUTION OP Place 1 drop into both eyes every evening.  Marland Kitchen aspirin EC 81 MG EC tablet Take 1 tablet (81 mg total) by mouth daily.  Marland Kitchen atorvastatin (LIPITOR) 40  MG tablet Take 1 tablet (40 mg total) by mouth daily at 6 PM.  . Cinacalcet HCl (SENSIPAR PO) Take 1 tablet by mouth daily.  . clotrimazole (LOTRIMIN) 1 % cream SMARTSIG:Sparingly Topical Twice Daily  . cyanocobalamin 500 MCG tablet Take 1 tablet (500 mcg total) by mouth daily.  Marland Kitchen gabapentin (NEURONTIN) 300 MG capsule Take 300 mg by mouth 2 (two) times a day.  . hydroxychloroquine (PLAQUENIL) 200 MG tablet Take 200 mg by mouth daily.   . multivitamin (RENA-VIT) TABS tablet Take 1 tablet by mouth daily.  Marland Kitchen omeprazole (PRILOSEC) 20 MG capsule Take 20 mg by mouth daily.   . OXYGEN Inhale 2 L into the lungs daily.  Marland Kitchen PRESCRIPTION MEDICATION Apply 1 application topically daily as needed (dry skin). Hydrocortisone mixed with eucerin  . sevelamer carbonate (RENVELA) 800 MG tablet Take 2 tablets (1,600 mg total) by mouth 3 (three) times daily with meals.  Dema Severin Petrolatum-Mineral Oil (SOOTHE NIGHTTIME OP) Place 1 drop into both eyes at bedtime.     Allergies:   Influenza virus vaccine   Social History   Tobacco Use  . Smoking status: Never Smoker  . Smokeless tobacco: Never Used  Vaping Use  . Vaping Use: Never used  Substance Use Topics  . Alcohol use: No    Alcohol/week: 0.0 standard drinks  . Drug use: No     Family Hx: The patient's family history includes Healthy in his father; Hypertension in his brother, mother, and sister.  Review of Systems  Constitutional: Positive for malaise/fatigue.  Gastrointestinal: Negative for hematochezia and melena.     EKGs/Labs/Other Test Reviewed:    EKG:  EKG is not ordered today.  The ekg ordered today demonstrates n/a  Recent Labs: 10/16/2020: BUN 23; Creatinine, Ser 7.81; Hemoglobin 11.8; Platelets 185; Potassium 5.1; Sodium 138   Recent Lipid Panel Lab Results  Component Value Date/Time   CHOL 117 09/09/2016 03:51 PM   TRIG 190 (H) 09/09/2016 03:51 PM   HDL 43 09/09/2016 03:51 PM   CHOLHDL 2.7 09/09/2016 03:51 PM   LDLCALC 36  09/09/2016 03:51 PM      Risk Assessment/Calculations:      Physical Exam:    VS:  BP 100/60   Pulse 70   Ht '5\' 10"'$  (1.778 m)   Wt 163 lb 3.2 oz (74 kg)   SpO2 97%   BMI 23.42 kg/m     Wt Readings from Last 3 Encounters:  12/09/20 163 lb 3.2 oz (74 kg)  10/16/20 170 lb (77.1 kg)  10/03/20 170 lb 10.2 oz (77.4 kg)     Constitutional:  Appearance: Healthy appearance. Not in distress.  Neck:     Vascular: JVD normal.  Pulmonary:     Effort: Pulmonary effort is normal.     Breath sounds: No wheezing. No rales.  Cardiovascular:     Normal rate. Regular rhythm. Normal S1. Normal S2.     Murmurs: There is a grade 2/6 systolic murmur at the URSB.  Edema:    Peripheral edema absent.  Abdominal:     Palpations: Abdomen is soft.  Skin:    General: Skin is warm and dry.  Neurological:     General: No focal deficit present.     Mental Status: Alert and oriented to person, place and time.     Cranial Nerves: Cranial nerves are intact.          ASSESSMENT & PLAN:    1. Precordial chest pain He has not had further chest discomfort since he presented to the emergency room.  His echocardiogram here demonstrated normal EF.  He did have a stress test at the [facility].  He also has another procedure coming up in a couple of weeks.  We will try to obtain records from the [place].  Further recommendations, if any, will be based upon the results of his stress test.  Follow-up with me in 3 months.  2. Chronic heart failure with preserved ejection fraction ([place]) Volume is managed by dialysis.  Volume status currently stable.  3. ESRD on dialysis [facility]) He remains on Monday, Wednesday, Friday dialysis.  4. Essential hypertension He does note that his blood pressure is sometimes elevated during dialysis.  He also has hypotension during dialysis.  I have asked him to monitor his blood pressure on his nondialysis days.  5. Nonrheumatic aortic valve stenosis Mild aortic stenosis  by recent echocardiogram.  I reviewed his echocardiogram findings with him today.  We can obtain a follow-up echocardiogram in the next 2 to 3 years for surveillance.      Dispo:  Given Dr. Francesca Oman departure, I will follow him here along with Dr. Johney Frame.  Return for Routine Follow Up, w/ Dr. Johney Frame, or Richardson Dopp, PA-C, in person.   Medication Adjustments/Labs and Tests Ordered: Current medicines are reviewed at length with the patient today.  Concerns regarding medicines are outlined above.  Tests Ordered: No orders of the defined types were placed in this encounter.  Medication Changes: No orders of the defined types were placed in this encounter.   Signed, Richardson Dopp, PA-C  12/09/2020 8:50 AM    [facility] [place], [place], [place]  [address] Phone: [phone]; Fax: [phone]

## 2020-12-09 ENCOUNTER — Ambulatory Visit: Payer: Medicare Other | Admitting: Physician Assistant

## 2020-12-09 ENCOUNTER — Telehealth: Payer: Self-pay | Admitting: Physician Assistant

## 2020-12-09 ENCOUNTER — Other Ambulatory Visit: Payer: Self-pay

## 2020-12-09 ENCOUNTER — Encounter: Payer: Self-pay | Admitting: Physician Assistant

## 2020-12-09 VITALS — BP 100/60 | HR 70 | Ht 70.0 in | Wt 163.2 lb

## 2020-12-09 DIAGNOSIS — I350 Nonrheumatic aortic (valve) stenosis: Secondary | ICD-10-CM | POA: Diagnosis not present

## 2020-12-09 DIAGNOSIS — N186 End stage renal disease: Secondary | ICD-10-CM

## 2020-12-09 DIAGNOSIS — R072 Precordial pain: Secondary | ICD-10-CM | POA: Diagnosis not present

## 2020-12-09 DIAGNOSIS — I10 Essential (primary) hypertension: Secondary | ICD-10-CM | POA: Diagnosis not present

## 2020-12-09 DIAGNOSIS — Z992 Dependence on renal dialysis: Secondary | ICD-10-CM

## 2020-12-09 DIAGNOSIS — I5032 Chronic diastolic (congestive) heart failure: Secondary | ICD-10-CM

## 2020-12-09 NOTE — Telephone Encounter (Signed)
Records from [facility] received. Patient had pharmacologic nuclear stress test in 05/2018 which demonstrated normal perfusion, EF 59%.  The study was low risk.  He was seen today in follow-up from a trip to the emergency room on 10/16/2020 for chest pain.  The patient should undergo a Lexiscan Myoview as previously planned by Dr. Meda Coffee to further evaluate the chest pain he came to the emergency room for on 10/16/2020.  Please arrange in the next 2 to 3 weeks.  Richardson Dopp, PA-C 12/09/2020 5:54 PM

## 2020-12-09 NOTE — Patient Instructions (Addendum)
Medication Instructions:  Your physician recommends that you continue on your current medications as directed. Please refer to the Current Medication list given to you today.  *If you need a refill on your cardiac medications before your next appointment, please call your pharmacy*   Lab Work: None ordered  If you have labs (blood work) drawn today and your tests are completely normal, you will receive your results only by: Marland Kitchen MyChart Message (if you have MyChart) OR . A paper copy in the mail If you have any lab test that is abnormal or we need to change your treatment, we will call you to review the results.   Testing/Procedures: None ordered   Follow-Up: At [facility], you and your health needs are our priority.  As part of our continuing mission to provide you with exceptional heart care, we have created designated Provider Care Teams.  These Care Teams include your primary Cardiologist (physician) and Advanced Practice Providers (APPs -  Physician Assistants and Nurse Practitioners) who all work together to provide you with the care you need, when you need it.  We recommend signing up for the patient portal called "MyChart".  Sign up information is provided on this After Visit Summary.  MyChart is used to connect with patients for Virtual Visits (Telemedicine).  Patients are able to view lab/test results, encounter notes, upcoming appointments, etc.  Non-urgent messages can be sent to your provider as well.   To learn more about what you can do with MyChart, go to [email or web address].    Your next appointment:   3 month(s)  03/10/2021 arrive at 9:00   The format for your next appointment:   In Person  Provider:   Richardson Dopp, PA-C   Other Instructions  Monitor your blood pressure on your non-dialysis days and keep a record of what it's running

## 2020-12-10 NOTE — Telephone Encounter (Signed)
Returned call to pt.  He has been made aware that we would like for him to have a lexiscan myoview since his last one was 05/2018.  Pt agreed.  Discussed instructions below verbally with pt and pt's wife.  All if any, questions were answered.   They are aware someone will call them to arrange a time.  You are scheduled for a Myocardial Perfusion Imaging Study  Please arrive 15 minutes prior to your appointment time for registration and insurance purposes.  The test will take approximately 3 to 4 hours to complete; you may bring reading material.  If someone comes with you to your appointment, they will need to remain in the main lobby due to limited space in the testing area. **If you are pregnant or breastfeeding, please notify the nuclear lab prior to your appointment**  How to prepare for your Myocardial Perfusion Test: . Do not eat or drink 3 hours prior to your test, except you may have water. . Do not consume products containing caffeine (regular or decaffeinated) 12 hours prior to your test. (ex: coffee, chocolate, sodas, tea). . Do bring a list of your current medications with you.  If not listed below, you may take your medications as normal. . Do wear comfortable clothes (no dresses or overalls) and walking shoes, tennis shoes preferred (No heels or open toe shoes are allowed). . Do NOT wear cologne, perfume, aftershave, or lotions (deodorant is allowed). . If these instructions are not followed, your test will have to be rescheduled.

## 2020-12-10 NOTE — Telephone Encounter (Signed)
Patient returning call.

## 2020-12-10 NOTE — Telephone Encounter (Signed)
Call placed to pt regarding phone message below.  Per wife, pt at dialysis and want be home until this afternoon.  No DPR on file to relay information to wife, so she will have him call back.

## 2020-12-17 DIAGNOSIS — R269 Unspecified abnormalities of gait and mobility: Secondary | ICD-10-CM | POA: Diagnosis not present

## 2020-12-17 DIAGNOSIS — R0902 Hypoxemia: Secondary | ICD-10-CM | POA: Diagnosis not present

## 2020-12-17 DIAGNOSIS — J8410 Pulmonary fibrosis, unspecified: Secondary | ICD-10-CM | POA: Diagnosis not present

## 2021-03-09 NOTE — Progress Notes (Signed)
Cardiology Office Note:    Date:  03/10/2021   ID:  [name], DOB 02-06-1941, MRN [id number]  PCP:  [name], MD   [place] [facility] Cardiologist:  Freada Bergeron, MD Cardiology APP:  Sharmon Revere      Referring MD: [name], MD   Chief Complaint:  Follow-up (Chest pain )    Patient Profile:    Travis [name] is a 80 y.o. 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 Pulmonary fibrosis Sjogren's disease (HFpEF) heart failure with preserved ejection fraction Hx of large pericardial effusion in setting of severe sepsis 10/2015 >> resolved on f/u echos GERD Hypertension Hx of DVT in 5/16 s/p IVC filter anticoagulation DCd 2/2 to perinephric hematoma Chronic respiratory failure on home O2 COPD Anemia Hx of R leg trauma ([place] War combat injury)  Lung nodule (per patient)   Prior CV studies: Echocardiogram 11/11/20 EF 60-65, no RWMA, mild LH, Gr 2 DD, normal RVSF, mild LAE, trivial MR, mild to mod TR, mild AI, mild AS (mean 12 mmHg)  Myoview 05/2018 [facility]) Normal perfusion, EF 59, Low Risk  History of Present Illness: Mr. Robarts was seen by  by Dr. Meda Coffee on 10/16/20 for chest pain that lasted 20 min.  He had a recent hx of labile BPs (high but then dropping low in dialysis).  His hsTrop was minimally elevated but not c/w ACS.  He was set up for an OP echocardiogram and Myoview.  His echocardiogram on 11/11/20 demonstrated normal EF and mod diastolic dysfunction.  There was mild to mod TR, mild AI, mild AS.  He did not show for his Myoview.   I saw him in f/u in 3/22.  He noted he had a Myoview at the [facility].  We requested records but he had not had a stress test since 2019.  I requested he have one but he never scheduled it.  He returns for f/u. He is here with his wife.  He has not had any further chest pain.  He has not had shortness of breath.  He notes his dry weight is being increased at  dialysis.  He has not had syncope, orthopnea, leg edema.     Past Medical History:  Diagnosis Date   Anemia    Arthritis    Chronic combined systolic and diastolic CHF (congestive heart failure) ([place])    a. Etiology of low EF not defined.   Chronic respiratory failure (HCC)    DVT (deep venous thrombosis) ([place])    a. 01/2015: RLE DVT. VQ scan intermediate probability. Underwent renal bx complicated by perinephric hematoma; anticoagulation stopped and IVC filter placed.   ESRD on hemodialysis [facility]) June 2016   a. 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 Received plasma exchange and steroids but didn't respond and ended up starting hemodialysis June 2016.    Essential hypertension    GERD (gastroesophageal reflux disease)    History of nuclear stress test    Myoview 9/19 [facility]): low risk    Perinephric hematoma 01/2015   Pneumonia    Proctitis 07/2015   Protein calorie malnutrition ([place])    Pulmonary fibrosis ([place])    Sjogren's disease ([place]) 01/28/2015   Syncope    a. 04/2015 in HD in setting of BP 50s.   Unintentional weight loss    Wears partial dentures     Current Medications: Current Meds  Medication Sig   aspirin EC  81 MG EC tablet Take 1 tablet (81 mg total) by mouth daily.   atorvastatin (LIPITOR) 40 MG tablet Take 1 tablet (40 mg total) by mouth daily at 6 PM.   cyanocobalamin 500 MCG tablet Take 1 tablet (500 mcg total) by mouth daily.   gabapentin (NEURONTIN) 300 MG capsule Take 300 mg by mouth 2 (two) times a day.   omeprazole (PRILOSEC) 20 MG capsule Take 20 mg by mouth daily.    OXYGEN Inhale 2 L into the lungs daily.   sevelamer carbonate (RENVELA) 800 MG tablet Take 2 tablets (1,600 mg total) by mouth 3 (three) times daily with meals.     Allergies:   Influenza virus vaccine   Social History   Tobacco Use   Smoking status: Never   Smokeless tobacco: Never  Vaping Use   Vaping Use: Never used  Substance  Use Topics   Alcohol use: No    Alcohol/week: 0.0 standard drinks   Drug use: No     Family Hx: The patient's family history includes Healthy in his father; Hypertension in his brother, mother, and sister.  ROS   EKGs/Labs/Other Test Reviewed:    EKG:  EKG is not ordered today.  The ekg ordered today demonstrates n/a  Recent Labs: 10/16/2020: BUN 23; Creatinine, Ser 7.81; Hemoglobin 11.8; Platelets 185; Potassium 5.1; Sodium 138   Recent Lipid Panel Lab Results  Component Value Date/Time   CHOL 117 09/09/2016 03:51 PM   TRIG 190 (H) 09/09/2016 03:51 PM   HDL 43 09/09/2016 03:51 PM   LDLCALC 36 09/09/2016 03:51 PM      Risk Assessment/Calculations:      Physical Exam:    VS:  BP 124/60   Pulse 68   Ht '5\' 10"'$  (1.778 m)   Wt 165 lb 6.4 oz (75 kg)   BMI 23.73 kg/m     Wt Readings from Last 3 Encounters:  03/10/21 165 lb 6.4 oz (75 kg)  12/09/20 163 lb 3.2 oz (74 kg)  10/16/20 170 lb (77.1 kg)     Constitutional:      Appearance: Healthy appearance. Not in distress.  Neck:     Vascular: JVD normal.  Pulmonary:     Effort: Pulmonary effort is normal.     Breath sounds: No wheezing. No rales.  Cardiovascular:     Normal rate. Regular rhythm. Normal S1. Normal S2.      Murmurs: There is a grade 2/6 systolic murmur at the URSB.  Edema:    Peripheral edema absent.  Abdominal:     Palpations: Abdomen is soft.  Skin:    General: Skin is warm and dry.  Neurological:     General: No focal deficit present.     Mental Status: Alert and oriented to person, place and time.     Cranial Nerves: Cranial nerves are intact.         ASSESSMENT & PLAN:    1. Precordial chest pain No recurrence. Last Myoview in 2019 was low risk.  He prefers to have testing done at [facility].  Since he has not had any chest pain in months, will hold off on scheduling any tests for now.  He knows to contact us if he has recurrent symptoms.  2. Chronic heart failure with preserved  ejection fraction (HCC) Volume management per dialysis.    3. ESRD on dialysis [facility]) He remains on MWF dialysis.    4. Nonrheumatic aortic valve stenosis Mild by recent echocardiogram.  Schedule  f/u echocardiogram in 2 years.    Dispo:  Return in about 1 year (around 03/10/2022) for Routine Follow Up, w/ Dr. Johney Frame, or Richardson Dopp, PA-C.   Medication Adjustments/Labs and Tests Ordered: Current medicines are reviewed at length with the patient today.  Concerns regarding medicines are outlined above.  Tests Ordered: Orders Placed This Encounter  Procedures   ECHOCARDIOGRAM COMPLETE   Medication Changes: No orders of the defined types were placed in this encounter.   Signed, Richardson Dopp, PA-C  03/10/2021 10:30 AM    [facility] [place], [place], [place]  [address] Phone: [phone]; Fax: [phone]

## 2021-03-10 ENCOUNTER — Ambulatory Visit (INDEPENDENT_AMBULATORY_CARE_PROVIDER_SITE_OTHER): Payer: Medicare Other | Admitting: Physician Assistant

## 2021-03-10 ENCOUNTER — Other Ambulatory Visit: Payer: Self-pay

## 2021-03-10 ENCOUNTER — Encounter: Payer: Self-pay | Admitting: Physician Assistant

## 2021-03-10 VITALS — BP 124/60 | HR 68 | Ht 70.0 in | Wt 165.4 lb

## 2021-03-10 DIAGNOSIS — N186 End stage renal disease: Secondary | ICD-10-CM | POA: Diagnosis not present

## 2021-03-10 DIAGNOSIS — I5032 Chronic diastolic (congestive) heart failure: Secondary | ICD-10-CM

## 2021-03-10 DIAGNOSIS — Z992 Dependence on renal dialysis: Secondary | ICD-10-CM | POA: Diagnosis not present

## 2021-03-10 DIAGNOSIS — I350 Nonrheumatic aortic (valve) stenosis: Secondary | ICD-10-CM | POA: Diagnosis not present

## 2021-03-10 DIAGNOSIS — R072 Precordial pain: Secondary | ICD-10-CM | POA: Diagnosis not present

## 2021-03-10 NOTE — Patient Instructions (Signed)
Medication Instructions:  Your physician recommends that you continue on your current medications as directed. Please refer to the Current Medication list given to you today.  *If you need a refill on your cardiac medications before your next appointment, please call your pharmacy*   Lab Work: None ordered   If you have labs (blood work) drawn today and your tests are completely normal, you will receive your results only by: [place] (if you have MyChart) OR A paper copy in the mail If you have any lab test that is abnormal or we need to change your treatment, we will call you to review the results.   Testing/Procedures: Your physician has requested that you have an echocardiogram to be done in June 2024.Marland KitchenEchocardiography is a painless test that uses sound waves to create images of your heart. It provides your doctor with information about the size and shape of your heart and how well your heart's chambers and valves are working. This procedure takes approximately one hour. There are no restrictions for this procedure.    Follow-Up: At [facility], you and your health needs are our priority.  As part of our continuing mission to provide you with exceptional heart care, we have created designated Provider Care Teams.  These Care Teams include your primary Cardiologist (physician) and Advanced Practice Providers (APPs -  Physician Assistants and Nurse Practitioners) who all work together to provide you with the care you need, when you need it.  We recommend signing up for the patient portal called "MyChart".  Sign up information is provided on this After Visit Summary.  MyChart is used to connect with patients for Virtual Visits (Telemedicine).  Patients are able to view lab/test results, encounter notes, upcoming appointments, etc.  Non-urgent messages can be sent to your provider as well.   To learn more about what you can do with MyChart, go to [email or web address].    Your  next appointment:   12 month(s)  The format for your next appointment:   In Person  Provider:   You may see Freada Bergeron, MD or one of the following Advanced Practice Providers on your designated Care Team:   Richardson Dopp, PA-C Robbie Lis, [place]   Other Instructions None

## 2021-05-20 ENCOUNTER — Encounter (HOSPITAL_COMMUNITY): Payer: Self-pay | Admitting: Emergency Medicine

## 2021-05-20 ENCOUNTER — Emergency Department (HOSPITAL_COMMUNITY): Payer: No Typology Code available for payment source

## 2021-05-20 ENCOUNTER — Inpatient Hospital Stay (HOSPITAL_COMMUNITY)
Admission: EM | Admit: 2021-05-20 | Discharge: 2021-05-26 | DRG: 871 | Disposition: A | Discharge status: Home Health | Payer: No Typology Code available for payment source | Source: Ambulatory Visit | Attending: Internal Medicine | Admitting: Internal Medicine

## 2021-05-20 ENCOUNTER — Other Ambulatory Visit: Payer: Self-pay

## 2021-05-20 DIAGNOSIS — R6889 Other general symptoms and signs: Secondary | ICD-10-CM | POA: Diagnosis not present

## 2021-05-20 DIAGNOSIS — Z9981 Dependence on supplemental oxygen: Secondary | ICD-10-CM | POA: Diagnosis not present

## 2021-05-20 DIAGNOSIS — M3500 Sicca syndrome, unspecified: Secondary | ICD-10-CM | POA: Diagnosis present

## 2021-05-20 DIAGNOSIS — R509 Fever, unspecified: Secondary | ICD-10-CM

## 2021-05-20 DIAGNOSIS — E871 Hypo-osmolality and hyponatremia: Secondary | ICD-10-CM | POA: Diagnosis not present

## 2021-05-20 DIAGNOSIS — K219 Gastro-esophageal reflux disease without esophagitis: Secondary | ICD-10-CM | POA: Diagnosis present

## 2021-05-20 DIAGNOSIS — N186 End stage renal disease: Secondary | ICD-10-CM | POA: Diagnosis present

## 2021-05-20 DIAGNOSIS — E11649 Type 2 diabetes mellitus with hypoglycemia without coma: Secondary | ICD-10-CM | POA: Diagnosis present

## 2021-05-20 DIAGNOSIS — M1990 Unspecified osteoarthritis, unspecified site: Secondary | ICD-10-CM | POA: Diagnosis present

## 2021-05-20 DIAGNOSIS — M898X9 Other specified disorders of bone, unspecified site: Secondary | ICD-10-CM | POA: Diagnosis present

## 2021-05-20 DIAGNOSIS — E785 Hyperlipidemia, unspecified: Secondary | ICD-10-CM | POA: Diagnosis present

## 2021-05-20 DIAGNOSIS — I252 Old myocardial infarction: Secondary | ICD-10-CM | POA: Diagnosis not present

## 2021-05-20 DIAGNOSIS — E1122 Type 2 diabetes mellitus with diabetic chronic kidney disease: Secondary | ICD-10-CM | POA: Diagnosis present

## 2021-05-20 DIAGNOSIS — D631 Anemia in chronic kidney disease: Secondary | ICD-10-CM | POA: Diagnosis present

## 2021-05-20 DIAGNOSIS — R269 Unspecified abnormalities of gait and mobility: Secondary | ICD-10-CM | POA: Diagnosis not present

## 2021-05-20 DIAGNOSIS — Z992 Dependence on renal dialysis: Secondary | ICD-10-CM

## 2021-05-20 DIAGNOSIS — U071 COVID-19: Secondary | ICD-10-CM | POA: Diagnosis present

## 2021-05-20 DIAGNOSIS — R4182 Altered mental status, unspecified: Secondary | ICD-10-CM | POA: Diagnosis not present

## 2021-05-20 DIAGNOSIS — A4189 Other specified sepsis: Secondary | ICD-10-CM | POA: Diagnosis present

## 2021-05-20 DIAGNOSIS — R0902 Hypoxemia: Secondary | ICD-10-CM | POA: Diagnosis not present

## 2021-05-20 DIAGNOSIS — I5041 Acute combined systolic (congestive) and diastolic (congestive) heart failure: Secondary | ICD-10-CM | POA: Diagnosis not present

## 2021-05-20 DIAGNOSIS — G9341 Metabolic encephalopathy: Secondary | ICD-10-CM | POA: Diagnosis present

## 2021-05-20 DIAGNOSIS — Z743 Need for continuous supervision: Secondary | ICD-10-CM | POA: Diagnosis not present

## 2021-05-20 DIAGNOSIS — I5042 Chronic combined systolic (congestive) and diastolic (congestive) heart failure: Secondary | ICD-10-CM | POA: Diagnosis present

## 2021-05-20 DIAGNOSIS — J8410 Pulmonary fibrosis, unspecified: Secondary | ICD-10-CM | POA: Diagnosis not present

## 2021-05-20 DIAGNOSIS — E162 Hypoglycemia, unspecified: Secondary | ICD-10-CM

## 2021-05-20 DIAGNOSIS — I132 Hypertensive heart and chronic kidney disease with heart failure and with stage 5 chronic kidney disease, or end stage renal disease: Secondary | ICD-10-CM | POA: Diagnosis present

## 2021-05-20 DIAGNOSIS — E1142 Type 2 diabetes mellitus with diabetic polyneuropathy: Secondary | ICD-10-CM | POA: Diagnosis present

## 2021-05-20 DIAGNOSIS — R531 Weakness: Secondary | ICD-10-CM | POA: Diagnosis not present

## 2021-05-20 DIAGNOSIS — Z887 Allergy status to serum and vaccine status: Secondary | ICD-10-CM

## 2021-05-20 DIAGNOSIS — Z79899 Other long term (current) drug therapy: Secondary | ICD-10-CM

## 2021-05-20 DIAGNOSIS — Z972 Presence of dental prosthetic device (complete) (partial): Secondary | ICD-10-CM

## 2021-05-20 DIAGNOSIS — J9611 Chronic respiratory failure with hypoxia: Secondary | ICD-10-CM | POA: Diagnosis present

## 2021-05-20 DIAGNOSIS — R404 Transient alteration of awareness: Secondary | ICD-10-CM | POA: Diagnosis not present

## 2021-05-20 DIAGNOSIS — Z7982 Long term (current) use of aspirin: Secondary | ICD-10-CM | POA: Diagnosis not present

## 2021-05-20 DIAGNOSIS — Z86718 Personal history of other venous thrombosis and embolism: Secondary | ICD-10-CM

## 2021-05-20 DIAGNOSIS — E161 Other hypoglycemia: Secondary | ICD-10-CM | POA: Diagnosis not present

## 2021-05-20 DIAGNOSIS — Z8249 Family history of ischemic heart disease and other diseases of the circulatory system: Secondary | ICD-10-CM

## 2021-05-20 LAB — COMPREHENSIVE METABOLIC PANEL
ALT: 10 U/L (ref 0–44)
AST: 17 U/L (ref 15–41)
Albumin: 4.1 g/dL (ref 3.5–5.0)
Alkaline Phosphatase: 81 U/L (ref 38–126)
Anion gap: 16 — ABNORMAL HIGH (ref 5–15)
BUN: 22 mg/dL (ref 8–23)
CO2: 30 mmol/L (ref 22–32)
Calcium: 9.4 mg/dL (ref 8.9–10.3)
Chloride: 92 mmol/L — ABNORMAL LOW (ref 98–111)
Creatinine, Ser: 8.37 mg/dL — ABNORMAL HIGH (ref 0.61–1.24)
GFR, Estimated: 6 mL/min — ABNORMAL LOW (ref >60)
Glucose, Bld: 162 mg/dL — ABNORMAL HIGH (ref 70–99)
Potassium: 4.5 mmol/L (ref 3.5–5.1)
Sodium: 138 mmol/L (ref 135–145)
Total Bilirubin: 0.6 mg/dL (ref 0.3–1.2)
Total Protein: 8.3 g/dL — ABNORMAL HIGH (ref 6.5–8.1)

## 2021-05-20 LAB — I-STAT VENOUS BLOOD GAS, ED
Acid-Base Excess: 6 mmol/L — ABNORMAL HIGH (ref 0.0–2.0)
Bicarbonate: 32.3 mmol/L — ABNORMAL HIGH (ref 20.0–28.0)
Calcium, Ion: 0.83 mmol/L — CL (ref 1.15–1.40)
HCT: 41 % (ref 39.0–52.0)
Hemoglobin: 13.9 g/dL (ref 13.0–17.0)
O2 Saturation: 50 %
Potassium: 6.6 mmol/L (ref 3.5–5.1)
Sodium: 136 mmol/L (ref 135–145)
TCO2: 34 mmol/L — ABNORMAL HIGH (ref 22–32)
pCO2, Ven: 51.8 mmHg (ref 44.0–60.0)
pH, Ven: 7.403 (ref 7.250–7.430)
pO2, Ven: 27 mmHg — CL (ref 32.0–45.0)

## 2021-05-20 LAB — CBC WITH DIFFERENTIAL/PLATELET
Abs Immature Granulocytes: 0.05 10*3/uL (ref 0.00–0.07)
Basophils Absolute: 0 10*3/uL (ref 0.0–0.1)
Basophils Relative: 1 %
Eosinophils Absolute: 0.1 10*3/uL (ref 0.0–0.5)
Eosinophils Relative: 1 %
HCT: 44 % (ref 39.0–52.0)
Hemoglobin: 13.2 g/dL (ref 13.0–17.0)
Immature Granulocytes: 1 %
Lymphocytes Relative: 8 %
Lymphs Abs: 0.6 10*3/uL — ABNORMAL LOW (ref 0.7–4.0)
MCH: 30.6 pg (ref 26.0–34.0)
MCHC: 30 g/dL (ref 30.0–36.0)
MCV: 102.1 fL — ABNORMAL HIGH (ref 80.0–100.0)
Monocytes Absolute: 1.3 10*3/uL — ABNORMAL HIGH (ref 0.1–1.0)
Monocytes Relative: 17 %
Neutro Abs: 5.5 10*3/uL (ref 1.7–7.7)
Neutrophils Relative %: 72 %
Platelets: 147 10*3/uL — ABNORMAL LOW (ref 150–400)
RBC: 4.31 MIL/uL (ref 4.22–5.81)
RDW: 16.6 % — ABNORMAL HIGH (ref 11.5–15.5)
WBC: 7.5 10*3/uL (ref 4.0–10.5)
nRBC: 0 % (ref 0.0–0.2)

## 2021-05-20 LAB — CBG MONITORING, ED
Glucose-Capillary: 254 mg/dL — ABNORMAL HIGH (ref 70–99)
Glucose-Capillary: 29 mg/dL — CL (ref 70–99)
Glucose-Capillary: 107 mg/dL — ABNORMAL HIGH (ref 70–99)
Glucose-Capillary: 33 mg/dL — CL (ref 70–99)
Glucose-Capillary: 35 mg/dL — CL (ref 70–99)
Glucose-Capillary: 55 mg/dL — ABNORMAL LOW (ref 70–99)
Glucose-Capillary: 173 mg/dL — ABNORMAL HIGH (ref 70–99)
Glucose-Capillary: 126 mg/dL — ABNORMAL HIGH (ref 70–99)
Glucose-Capillary: 84 mg/dL (ref 70–99)

## 2021-05-20 LAB — APTT: aPTT: 31 seconds (ref 24–36)

## 2021-05-20 LAB — RESP PANEL BY RT-PCR (FLU A&B, COVID) ARPGX2
Influenza A by PCR: NEGATIVE
Influenza B by PCR: NEGATIVE
SARS Coronavirus 2 by RT PCR: POSITIVE — AB

## 2021-05-20 LAB — LACTIC ACID, PLASMA
Lactic Acid, Venous: 5.3 mmol/L (ref 0.5–1.9)
Lactic Acid, Venous: 3.7 mmol/L (ref 0.5–1.9)

## 2021-05-20 MED ORDER — GABAPENTIN 300 MG PO CAPS
300.0000 mg | ORAL_CAPSULE | Freq: Every evening | ORAL | Status: DC
  Administered 2021-05-21 – 2021-05-25 (×4): 300 mg via ORAL
  Filled 2021-05-20 (×4): qty 1

## 2021-05-20 MED ORDER — DEXTROSE 10 % IV SOLN: INTRAVENOUS | Status: DC

## 2021-05-20 MED ORDER — DEXTROSE 50 % IV SOLN
INTRAVENOUS | Status: AC
  Administered 2021-05-20: 50 mL via INTRAVENOUS
  Filled 2021-05-20: qty 50

## 2021-05-20 MED ORDER — ACETAMINOPHEN 325 MG PO TABS: 650.0000 mg | ORAL_TABLET | Freq: Four times a day (QID) | ORAL | Status: DC | PRN

## 2021-05-20 MED ORDER — LATANOPROST 0.005 % OP SOLN
1.0000 [drp] | Freq: Every day | OPHTHALMIC | Status: DC
  Administered 2021-05-21 – 2021-05-25 (×6): 1 [drp] via OPHTHALMIC
  Filled 2021-05-20: qty 2.5

## 2021-05-20 MED ORDER — ONDANSETRON HCL 4 MG/2ML IJ SOLN: 4.0000 mg | Freq: Four times a day (QID) | INTRAMUSCULAR | Status: DC | PRN

## 2021-05-20 MED ORDER — HEPARIN SODIUM (PORCINE) 5000 UNIT/ML IJ SOLN
5000.0000 [IU] | Freq: Two times a day (BID) | INTRAMUSCULAR | Status: DC
  Administered 2021-05-20 – 2021-05-26 (×11): 5000 [IU] via SUBCUTANEOUS
  Filled 2021-05-20 (×12): qty 1

## 2021-05-20 MED ORDER — DEXTROSE 50 % IV SOLN
50.0000 mL | Freq: Once | INTRAVENOUS | Status: AC
  Administered 2021-05-20: 50 mL via INTRAVENOUS
  Filled 2021-05-20: qty 50

## 2021-05-20 MED ORDER — VANCOMYCIN HCL 1500 MG/300ML IV SOLN
1500.0000 mg | Freq: Once | INTRAVENOUS | Status: AC
  Administered 2021-05-20: 1500 mg via INTRAVENOUS
  Filled 2021-05-20: qty 300

## 2021-05-20 MED ORDER — DEXTROSE 50 % IV SOLN: 50.0000 mL | Freq: Once | INTRAVENOUS | Status: AC

## 2021-05-20 MED ORDER — VITAMIN B-12 1000 MCG PO TABS
500.0000 ug | ORAL_TABLET | Freq: Every day | ORAL | Status: DC
  Administered 2021-05-21 – 2021-05-26 (×6): 500 ug via ORAL
  Filled 2021-05-20 (×6): qty 1

## 2021-05-20 MED ORDER — ONDANSETRON HCL 4 MG PO TABS: 4.0000 mg | ORAL_TABLET | Freq: Four times a day (QID) | ORAL | Status: DC | PRN

## 2021-05-20 MED ORDER — INSULIN ASPART 100 UNIT/ML IJ SOLN: 0.0000 [IU] | Freq: Three times a day (TID) | INTRAMUSCULAR | Status: DC

## 2021-05-20 MED ORDER — ATORVASTATIN CALCIUM 40 MG PO TABS
40.0000 mg | ORAL_TABLET | Freq: Every day | ORAL | Status: DC
  Administered 2021-05-21 – 2021-05-25 (×4): 40 mg via ORAL
  Filled 2021-05-20 (×4): qty 1

## 2021-05-20 MED ORDER — ACETAMINOPHEN 650 MG RE SUPP: 650.0000 mg | Freq: Four times a day (QID) | RECTAL | Status: DC | PRN

## 2021-05-20 MED ORDER — SODIUM CHLORIDE 0.9 % IV SOLN
2.0000 g | INTRAVENOUS | Status: DC
  Administered 2021-05-20: 2 g via INTRAVENOUS
  Filled 2021-05-20: qty 20

## 2021-05-20 MED ORDER — DORZOLAMIDE HCL-TIMOLOL MAL 2-0.5 % OP SOLN
1.0000 [drp] | Freq: Two times a day (BID) | OPHTHALMIC | Status: DC
  Administered 2021-05-20 – 2021-05-26 (×12): 1 [drp] via OPHTHALMIC
  Filled 2021-05-20: qty 10

## 2021-05-20 MED ORDER — METRONIDAZOLE 500 MG/100ML IV SOLN: 500.0000 mg | Freq: Once | INTRAVENOUS | 0 refills | Status: DC

## 2021-05-20 MED ORDER — VANCOMYCIN VARIABLE DOSE PER UNSTABLE RENAL FUNCTION (PHARMACIST DOSING): Status: DC

## 2021-05-20 MED ORDER — ACETAMINOPHEN 650 MG RE SUPP
650.0000 mg | Freq: Once | RECTAL | Status: AC
  Administered 2021-05-20: 650 mg via RECTAL
  Filled 2021-05-20: qty 1

## 2021-05-20 MED ORDER — ASPIRIN EC 81 MG PO TBEC
81.0000 mg | DELAYED_RELEASE_TABLET | Freq: Every day | ORAL | Status: DC
  Administered 2021-05-20 – 2021-05-26 (×7): 81 mg via ORAL
  Filled 2021-05-20 (×7): qty 1

## 2021-05-20 MED ORDER — PANTOPRAZOLE SODIUM 40 MG PO TBEC
40.0000 mg | DELAYED_RELEASE_TABLET | Freq: Every day | ORAL | Status: DC
  Administered 2021-05-20 – 2021-05-26 (×7): 40 mg via ORAL
  Filled 2021-05-20 (×7): qty 1

## 2021-05-20 MED ORDER — SEVELAMER CARBONATE 800 MG PO TABS
1600.0000 mg | ORAL_TABLET | Freq: Three times a day (TID) | ORAL | Status: DC
  Administered 2021-05-21 – 2021-05-25 (×10): 1600 mg via ORAL
  Filled 2021-05-20 (×11): qty 2

## 2021-05-20 NOTE — ED Notes (Signed)
Critical Labs given to Nurse.

## 2021-05-20 NOTE — Sepsis Progress Note (Signed)
Notified provider of need to order fluid bolus or to edit note to reflect an appropriate reason to not give the bolus.

## 2021-05-20 NOTE — Progress Notes (Signed)
Pharmacy Antibiotic Note  Travis [name] is a 80 y.o. male admitted on 05/20/2021 with possible sepsis. He presented to ED after falling at HD this morning, found to be hypoglycemic and febrile (Tmax 101F). Pharmacy has been consulted for vancomycin dosing.   WBC 7.5  Plan: Ceftriaxone dosing per MD Vancomycin 1500 mg IV x1 Variable vancomycin dosing based on renal function and HD schedule  Monitor CBC, renal function, cultures and susceptibilities - de-escalate as able  Height: '5\' 10"'$  (177.8 cm) Weight: 75 kg (165 lb 5.5 oz) IBW/kg (Calculated) : 73  Temp (24hrs), Avg:101 F (38.3 C), Min:99.6 F (37.6 C), Max:101.9 F (38.8 C)  Recent Labs  Lab 05/20/21 1310  WBC 7.5  LATICACIDVEN 5.3*    CrCl cannot be calculated (Patient's most recent lab result is older than the maximum 21 days allowed.).    Allergies  Allergen Reactions   Influenza Virus Vaccine Other (See Comments)    Antimicrobials this admission: Vancomycin 9/7 >>   Dose adjustments this admission: N/A  Microbiology results: 9/7 BCx: pending   Thank you for allowing pharmacy to be a part of this patient's care.  Laurey Arrow, PharmD PGY1 Pharmacy Resident 05/20/2021  8:57 PM  Please check [email or web address] for unit-specific pharmacy phone numbers.

## 2021-05-20 NOTE — ED Provider Notes (Signed)
[facility] EMERGENCY DEPARTMENT Provider Note   CSN: [id number] Arrival date & time: 05/20/21  1212     History Chief Complaint  Patient presents with   Hypoglycemia   Fall    Travis [name] is a 80 y.o. male.  Level 5 caveat due to slightly altered mental status due to a blood sugar of 29 upon arrival.  According to family on the phone he had a fall this morning prior to going to dialysis around 7 this morning.  Was able to get up and ambulate with his walker to the car.  While at dialysis he became more altered.  EMS got there and was found to have a low blood sugar given glucose.  Blood sugar upon arrival here is 29.  Given amp of D50.  He appears to move all of his extremities and follows commands but is lethargic.  Found to have a fever of 101.9.  Patient can tell me his name but cannot provide much history otherwise.  Talked on the phone with family and they confirmed that he is not on a blood thinner and that he does not take any insulin or medications for diabetes.  I will talk with them when they arrive.     Hypoglycemia Severity:  Mild Onset quality:  Gradual Duration:  5 hours Timing:  Constant Progression:  Unchanged Chronicity:  New Diabetic status:  Non-diabetic Context: not new diabetes diagnosis   Relieved by:  IV glucose Associated symptoms: altered mental status and decreased responsiveness   Fall      Past Medical History:  Diagnosis Date   Anemia    Arthritis    Chronic combined systolic and diastolic CHF (congestive heart failure) ([place])    a. Etiology of low EF not defined.   Chronic respiratory failure (HCC)    DVT (deep venous thrombosis) ([place])    a. 01/2015: RLE DVT. VQ scan intermediate probability. Underwent renal bx complicated by perinephric hematoma; anticoagulation stopped and IVC filter placed.   ESRD on hemodialysis [facility]) June 2016   a. 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  Received plasma exchange and steroids but didn't respond and ended up starting hemodialysis June 2016.    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 04/2015 in HD in setting of BP 50s.   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   Pericardial effusion 0000000   Chronic systolic CHF (congestive heart failure), NYHA class 2 ([place]) 11/05/2015   Sepsis ([place]) 11/05/2015   SIRS (systemic inflammatory response syndrome) ([place]) 11/05/2015   Chest pain on breathing    Thrombocytopenia ([place]) 08/11/2015   FTT (failure to thrive) in adult 08/11/2015   Cough    Rectal pain 07/28/2015   Fecal impaction ([place]) 07/28/2015   Rectal bleeding 07/28/2015   Chronic respiratory failure ([place]) 07/28/2015  Lower gastrointestinal bleed    Pulmonary edema 05/14/2015   Hypotension of hemodialysis [id number]   Acute systolic CHF (congestive heart failure) ([place]) 04/25/2015   Protein-calorie malnutrition, severe ([place]) 04/19/2015   NSTEMI (non-ST elevated myocardial infarction) ([place]) 04/18/2015   Other abnormal glucose 02/21/2015   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   Iron deficiency anemia, unspecified 02/20/2015   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   Pruritus, unspecified 02/20/2015   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   ESRD on dialysis [facility])    Acute renal failure syndrome (HCC)     AKI (acute kidney injury) ([place])    Palliative care encounter    Acute on chronic renal failure ([place]) 01/31/2015   Acute DVT of right tibial vein ([place]) 01/29/2015   Acute kidney injury ([place]) 01/29/2015   DVT (deep venous thrombosis) ([place]) 01/29/2015   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   Acute on chronic respiratory failure with hypoxia ([place]) 01/28/2015   Physical deconditioning 01/28/2015   Sjogren's disease ([place]) 01/28/2015   Pedal edema 01/28/2015   Peripheral edema 01/07/2015   HCAP (healthcare-associated pneumonia) 01/06/2015   Steroid-induced hyperglycemia 01/06/2015   Dyspnea    Pulmonary fibrosis (HCC)    Knee pain    Shoulder pain    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   Anasarca 12/17/2014   Fever 12/17/2014   Elevated blood pressure 12/17/2014   Normocytic anemia 12/17/2014    Past Surgical History:  Procedure Laterality Date   AV FISTULA PLACEMENT Right 02/17/2015   Procedure: INSERTION OF RIGHT ARM  ARTERIOVENOUS (AV) GORE-TEX GRAFT ;  Surgeon: Elam Dutch, MD;  Location: [place];  Service: Vascular;  Laterality: Right;   FLEXIBLE SIGMOIDOSCOPY N/A 08/04/2015   Procedure: FLEXIBLE SIGMOIDOSCOPY;  Surgeon: Wilford Corner, MD;  Location: [place];  Service: Endoscopy;  Laterality: N/A;   HEMORROIDECTOMY  1999   INSERTION OF DIALYSIS CATHETER N/A 02/17/2015   Procedure: INSERTION OF DIALYSIS CATHETER RIGHT INTERNAL JUGULAR VEIN;  Surgeon: Elam Dutch, MD;  Location: [place];  Service: Vascular;  Laterality: N/A;   MULTIPLE TOOTH EXTRACTIONS     PERIPHERAL VASCULAR BALLOON ANGIOPLASTY Right 01/23/2019   Procedure: PERIPHERAL VASCULAR BALLOON ANGIOPLASTY;  Surgeon: Elam Dutch, MD;  Location: [place] [facility];  Service: Cardiovascular;  Laterality: Right;   REVISION OF ARTERIOVENOUS GORETEX GRAFT Right 03/29/2017   Procedure: REVISION OF ARTERIOVENOUS GORETEX GRAFT;  Surgeon: Conrad Bruceton, MD;  Location: [facility] OR;  Service: Vascular;  Laterality: Right;   Surgical procedure to remove a mole as a child Right Eye area   At around 80 years old       Family History  Problem Relation Age of Onset   Hypertension Mother    Healthy Father    Hypertension Sister    Hypertension Brother     Social History   Tobacco Use   Smoking status: Never   Smokeless tobacco: Never  Vaping Use   Vaping Use: Never used  Substance Use Topics   Alcohol use: No    Alcohol/week: 0.0 standard drinks   Drug use: No    Home Medications Prior to Admission medications   Medication Sig Start Date End Date Taking? Authorizing Provider  aspirin EC 81 MG EC tablet Take 1 tablet (81 mg total) by mouth daily. 04/23/15   Reyne Dumas, MD  atorvastatin (LIPITOR) 40 MG tablet Take 1 tablet (40 mg total) by mouth daily at 6 PM. 04/23/15  Reyne Dumas, MD  cyanocobalamin 500 MCG tablet Take 1 tablet (500 mcg total) by mouth daily. 12/26/14   Donne Hazel, MD  gabapentin (NEURONTIN) 300 MG capsule Take 300 mg by mouth 2 (two) times a day.    [provider]  omeprazole (PRILOSEC) 20 MG capsule Take 20 mg by mouth daily.  07/31/15   [provider]  OXYGEN Inhale 2 L into the lungs daily.    [provider]  sevelamer carbonate (RENVELA) 800 MG tablet Take 2 tablets (1,600 mg total) by mouth 3 (three) times daily with meals. 05/17/15   Cherene Altes, MD    Allergies    Influenza virus vaccine  Review of Systems   Review of Systems  Unable to perform ROS: Mental status change  Constitutional:  Positive for decreased responsiveness.   Physical Exam Updated Vital Signs BP 133/72   Pulse 70   Temp 99.6 F (37.6 C) (Oral)   Resp 19   Ht '5\' 10"'$  (1.778 m)   Wt 75 kg   SpO2 100%   BMI 23.72 kg/m   Physical Exam Vitals and nursing note reviewed.  Constitutional:      Appearance: He is well-developed. He is ill-appearing.  HENT:     Head: Normocephalic  and atraumatic.     Nose: Nose normal.     Mouth/Throat:     Mouth: Mucous membranes are moist.  Eyes:     Extraocular Movements: Extraocular movements intact.     Conjunctiva/sclera: Conjunctivae normal.     Pupils: Pupils are equal, round, and reactive to light.     Comments: Pupils are equal and reactive  Cardiovascular:     Rate and Rhythm: Normal rate and regular rhythm.     Pulses: Normal pulses.     Heart sounds: Normal heart sounds. No murmur heard. Pulmonary:     Effort: Pulmonary effort is normal. No respiratory distress.     Breath sounds: Normal breath sounds.  Abdominal:     Palpations: Abdomen is soft.     Tenderness: There is no abdominal tenderness.  Musculoskeletal:        General: No tenderness. Normal range of motion.     Cervical back: Normal range of motion and neck supple.  Skin:    General: Skin is warm and dry.     Comments: Dialysis fistula in the right arm  Neurological:     General: No focal deficit present.     Mental Status: He is alert.     Comments: Patient can tell me his name, he can follow commands but overall he is slow to answer questions and slow to move all his extremities, he is able to tell me that I am holding up 2 fingers when testing visual field but overall difficult to establish neurologic baseline    ED Results / Procedures / Treatments   Labs (all labs ordered are listed, but only abnormal results are displayed) Labs Reviewed  CBC WITH DIFFERENTIAL/PLATELET - Abnormal; Notable for the following components:      Result Value   MCV 102.1 (*)    RDW 16.6 (*)    Platelets 147 (*)    Lymphs Abs 0.6 (*)    Monocytes Absolute 1.3 (*)    All other components within normal limits  LACTIC ACID, PLASMA - Abnormal; Notable for the following components:   Lactic Acid, Venous 5.3 (*)    All other components within normal limits  CBG MONITORING, ED - Abnormal;  Notable for the following components:   Glucose-Capillary 29 (*)    All other  components within normal limits  CBG MONITORING, ED - Abnormal; Notable for the following components:   Glucose-Capillary 254 (*)    All other components within normal limits  I-STAT VENOUS BLOOD GAS, ED - Abnormal; Notable for the following components:   pO2, Ven 27.0 (*)    Bicarbonate 32.3 (*)    TCO2 34 (*)    Acid-Base Excess 6.0 (*)    Potassium 6.6 (*)    Calcium, Ion 0.83 (*)    All other components within normal limits  CBG MONITORING, ED - Abnormal; Notable for the following components:   Glucose-Capillary 107 (*)    All other components within normal limits  CBG MONITORING, ED - Abnormal; Notable for the following components:   Glucose-Capillary 33 (*)    All other components within normal limits  CBG MONITORING, ED - Abnormal; Notable for the following components:   Glucose-Capillary 55 (*)    All other components within normal limits  RESP PANEL BY RT-PCR (FLU A&B, COVID) [id number]  CULTURE, BLOOD (ROUTINE X 2)  CULTURE, BLOOD (ROUTINE X 2)  LACTIC ACID, PLASMA  COMPREHENSIVE METABOLIC PANEL  APTT  MISCELLANEOUS GENETIC TEST  APTT    EKG EKG Interpretation  Date/Time:  Wednesday May 20 2021 13:53:26 EDT Ventricular Rate:  74 PR Interval:  213 QRS Duration: 99 QT Interval:  396 QTC Calculation: 440 R Axis:   -62 Text Interpretation: Sinus rhythm Ventricular premature complex Borderline prolonged PR interval Consider left atrial enlargement Left anterior fascicular block LVH with secondary repolarization abnormality Confirmed by Lennice Sites ([id number]) on 05/20/2021 1:56:56 PM  Radiology CT HEAD WO CONTRAST (5MM)  Result Date: 05/20/2021 CLINICAL DATA:  Status post fall. In stage renal disease on hemodialysis. History of Sjogren syndrome. EXAM: CT HEAD WITHOUT CONTRAST CT CERVICAL SPINE WITHOUT CONTRAST TECHNIQUE: Multidetector CT imaging of the head and cervical spine was performed following the standard protocol without intravenous contrast. Multiplanar CT  image reconstructions of the cervical spine were also generated. COMPARISON:  CT head 04/22/2015.  Chest CT 11/06/2015. FINDINGS: CT HEAD FINDINGS Brain: There is no evidence of acute intracranial hemorrhage, mass lesion, brain edema or extra-axial fluid collection. Mildly progressive atrophy with prominence of the ventricles and subarachnoid spaces there is confluent low-density in the periventricular white matter which has progressed, likely due to progressive chronic small vessel ischemic changes. There is no CT evidence of acute cortical infarction. Vascular: Prominent intracranial vascular calcifications. No hyperdense vessel identified. Skull: Negative for fracture or focal lesion. Sinuses/Orbits: The visualized paranasal sinuses and mastoid air cells are clear. No orbital abnormalities are seen. Other: None. CT CERVICAL SPINE FINDINGS Alignment: Normal Skull base and vertebrae: No evidence of acute fracture or traumatic subluxation. There are scattered tiny lucent lesions throughout the vertebral bodies which may relate to chronic renal failure/hyperparathyroidism. Appearance is similar to prior chest CT, making a neoplastic process unlikely. Soft tissues and spinal canal: No prevertebral fluid or swelling. No visible canal hematoma. Disc levels: Multilevel cervical spondylosis with loss of disc height, uncinate spurring and facet hypertrophy. Resulting mild foraminal narrowing at multiple levels. No large disc herniation identified. Upper chest: Unremarkable. Other: Mild bilateral carotid atherosclerosis. IMPRESSION: 1. No acute intracranial or calvarial findings. 2. Mildly progressive atrophy and chronic small vessel ischemic changes. 3. No evidence of acute cervical spine fracture, traumatic subluxation or static signs of instability. 4. Mild multilevel spondylosis. Electronically Signed   By: Gwyndolyn Saxon  Lin Landsman M.D.   On: 05/20/2021 13:36   CT Cervical Spine Wo Contrast  Result Date: 05/20/2021 CLINICAL  DATA:  Status post fall. In stage renal disease on hemodialysis. History of Sjogren syndrome. EXAM: CT HEAD WITHOUT CONTRAST CT CERVICAL SPINE WITHOUT CONTRAST TECHNIQUE: Multidetector CT imaging of the head and cervical spine was performed following the standard protocol without intravenous contrast. Multiplanar CT image reconstructions of the cervical spine were also generated. COMPARISON:  CT head 04/22/2015.  Chest CT 11/06/2015. FINDINGS: CT HEAD FINDINGS Brain: There is no evidence of acute intracranial hemorrhage, mass lesion, brain edema or extra-axial fluid collection. Mildly progressive atrophy with prominence of the ventricles and subarachnoid spaces there is confluent low-density in the periventricular white matter which has progressed, likely due to progressive chronic small vessel ischemic changes. There is no CT evidence of acute cortical infarction. Vascular: Prominent intracranial vascular calcifications. No hyperdense vessel identified. Skull: Negative for fracture or focal lesion. Sinuses/Orbits: The visualized paranasal sinuses and mastoid air cells are clear. No orbital abnormalities are seen. Other: None. CT CERVICAL SPINE FINDINGS Alignment: Normal Skull base and vertebrae: No evidence of acute fracture or traumatic subluxation. There are scattered tiny lucent lesions throughout the vertebral bodies which may relate to chronic renal failure/hyperparathyroidism. Appearance is similar to prior chest CT, making a neoplastic process unlikely. Soft tissues and spinal canal: No prevertebral fluid or swelling. No visible canal hematoma. Disc levels: Multilevel cervical spondylosis with loss of disc height, uncinate spurring and facet hypertrophy. Resulting mild foraminal narrowing at multiple levels. No large disc herniation identified. Upper chest: Unremarkable. Other: Mild bilateral carotid atherosclerosis. IMPRESSION: 1. No acute intracranial or calvarial findings. 2. Mildly progressive atrophy  and chronic small vessel ischemic changes. 3. No evidence of acute cervical spine fracture, traumatic subluxation or static signs of instability. 4. Mild multilevel spondylosis. Electronically Signed   By: Richardean Sale M.D.   On: 05/20/2021 13:36   DG Chest Port 1 View  Result Date: 05/20/2021 CLINICAL DATA:  Questionable sepsis EXAM: PORTABLE CHEST 1 VIEW COMPARISON:  Chest radiograph 10/16/2020 FINDINGS: The heart is at the upper limits of normal for size, increased compared to the prior chest radiograph. The mediastinal contours are within normal limits. There is calcified atherosclerotic plaque of the aortic arch. A right subclavian vascular stent is again noted. There is no focal consolidation or pulmonary edema. There is no pleural effusion or pneumothorax. There is no acute osseous abnormality. IMPRESSION: Increased size of the cardiac silhouette compared to the study from 10/16/2020. Given the patient has had pericardial effusions in the past, consider correlation with ECHO. Electronically Signed   By: Valetta Mole M.D.   On: 05/20/2021 13:39    Procedures .Critical Care  Date/Time: 05/20/2021 3:00 PM Performed by: Lennice Sites, DO Authorized by: Lennice Sites, DO   Critical care provider statement:    Critical care time (minutes):  65   Critical care was necessary to treat or prevent imminent or life-threatening deterioration of the following conditions:  Metabolic crisis   Critical care was time spent personally by me on the following activities:  Blood draw for specimens, development of treatment plan with patient or surrogate, evaluation of patient's response to treatment, examination of patient, obtaining history from patient or surrogate, ordering and performing treatments and interventions and ordering and review of laboratory studies   Medications Ordered in ED Medications  cefTRIAXone (ROCEPHIN) 2 g in sodium chloride 0.9 % 100 mL IVPB (0 g Intravenous Stopped 05/20/21 1431)   dextrose  50 % solution 50 mL (has no administration in time range)  dextrose 10 % infusion (has no administration in time range)  dextrose 50 % solution 50 mL (50 mLs Intravenous Given 05/20/21 1223)  acetaminophen (TYLENOL) suppository 650 mg (650 mg Rectal Given 05/20/21 1331)    ED Course  I have reviewed the triage vital signs and the nursing notes.  Pertinent labs & imaging results that were available during my care of the patient were reviewed by me and considered in my medical decision making (see chart for details).    MDM Rules/Calculators/A&P                           Xiang Reum is here with fall, hypoglycemia.  Patient arrives febrile but otherwise unremarkable vitals.  Patient had mechanical fall supposedly this morning around 7 AM prior to going dialysis.  As patient was at dialysis he became less responsive.  Patient arrives 5 to 6 hours after his original fall.  He was found to have hypoglycemia in the field and was given glucose but upon arrival here blood sugar is 29 and given an amp of D50.  Blood sugar has responded but is downtrending.  Blood sugar originally 250 and now down to 107.  We will continue to trend.  Family is adamant that he is not on any type of insulin.  History of blood clot but not on blood thinners.  Patient was in his normal state of health yesterday.  They did not think that he had any cough, diarrhea, nausea, abdominal pain.  Patient is overall mildly encephalopathic.  He can follow commands and tell me his name but seems to be with discomfort.  Abdomen is soft.  Will give Tylenol.  We will get a head and neck CT.  We will pursue sepsis work-up.  He does not make any urine.  We will give him a dose of Rocephin.  We will continue to monitor blood sugar.  Not quite sure why that is low.  Blood gas reveals a normal pH.  Potassium 6.6 but suspect hemolysis.  EKG shows sinus rhythm.  No hyperkalemic changes.  Does not sound like he fully finished  dialysis.  Blood sugar has dropped again to 33.  Lactic acid is 5.4.  We will give an amp of D50 and start D10 infusion.  He has no leukocytosis.  We will avoid any major fluid boluses given dialysis history.  Mentation improved greatly after Tylenol.  Chest x-ray no infection.  Still awaiting CMP but will add CT scan of his chest, abdomen, pelvis to look for source for possible fever.  Viral testing is also pending.  Patient will need admission given repeated drops in blood sugar.  May need sepsis rule out as well.  Please see oncoming ED staff note for further results, evaluation, disposition of the patient.  This chart was dictated using voice recognition software.  Despite best efforts to proofread,  errors can occur which can change the documentation meaning.   Final Clinical Impression(s) / ED Diagnoses Final diagnoses:  Fever  Hypoglycemia  Altered mental status, unspecified altered mental status type    Rx / DC Orders ED Discharge Orders     None        Lennice Sites, DO 05/20/21 1502

## 2021-05-20 NOTE — ED Provider Notes (Signed)
Seen after prior ED provider.  Hospitalist service is aware of case and will evaluate for admission.   Valarie Merino, MD 05/20/21 [phone]

## 2021-05-20 NOTE — ED Notes (Signed)
Patient transported to CT 

## 2021-05-20 NOTE — H&P (Addendum)
History and Physical    Travis [name] [id number] DOB: 10-30-1940 DOA: 05/20/2021  PCP: [name], MD (Confirm with patient/family/NH records and if not entered, this has to be entered at [employer] point of entry) Patient coming from: Home  I have personally briefly reviewed patient's old medical records in [place]  Chief Complaint: Feeling tired.  HPI: Travis [name] is a 80 y.o. 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  Wife at bedside reported patient was at his baseline health status yesterday and this morning, went out around 7 AM for dialysis.  At dialysis center, patient was able to complete to 1/2-hour HD then it was noticed that patient became lethargic, fingerstick showed glucose 29, and patient was given D50 x1 and sent to the ED.  Claimed that he developed cramping-like abdominal pain, periumbilical, 0000000, no feeling of nausea vomiting or diarrhea.  No neck pain or headache, no cough no shortness of breath.  Does not make urine anymore.  ED Course: Glucose 29 on arrival, vital signs of new onset of fever one 1.9.  Patient was given another D50, but FS improved to 35 in 2 hours, ED started patient on D10 drip.  COVID returned positive, CT chest abdomen pelvis showed no no signs of pneumonia.  Review of Systems: As per HPI otherwise 14 point review of systems negative.    Past Medical History:  Diagnosis Date   Anemia    Arthritis    Chronic combined systolic and diastolic CHF (congestive heart failure) ([place])    a. Etiology of low EF not defined.   Chronic respiratory failure (HCC)    DVT (deep venous thrombosis) ([place])    a. 01/2015: RLE DVT. VQ scan intermediate probability. Underwent renal bx complicated by perinephric hematoma; anticoagulation stopped and IVC filter placed.    ESRD on hemodialysis [facility]) June 2016   a. 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 Received plasma exchange and steroids but didn't respond and ended up starting hemodialysis June 2016.    Essential hypertension    GERD (gastroesophageal reflux disease)    History of nuclear stress test    Myoview 9/19 [facility]): low risk    Perinephric hematoma 01/2015   Pneumonia    Proctitis 07/2015   Protein calorie malnutrition ([place])    Pulmonary fibrosis ([place])    Sjogren's disease ([place]) 01/28/2015   Syncope    a. 04/2015 in HD in setting of BP 50s.   Unintentional weight loss    Wears partial dentures     Past Surgical History:  Procedure Laterality Date   AV FISTULA PLACEMENT Right 02/17/2015   Procedure: INSERTION OF RIGHT ARM  ARTERIOVENOUS (AV) GORE-TEX GRAFT ;  Surgeon: Elam Dutch, MD;  Location: [place];  Service: Vascular;  Laterality: Right;   FLEXIBLE SIGMOIDOSCOPY N/A 08/04/2015   Procedure: FLEXIBLE SIGMOIDOSCOPY;  Surgeon: Wilford Corner, MD;  Location: [facility] ENDOSCOPY;  Service: Endoscopy;  Laterality: N/A;   HEMORROIDECTOMY  1999   INSERTION OF DIALYSIS CATHETER N/A 02/17/2015   Procedure: INSERTION OF DIALYSIS CATHETER RIGHT INTERNAL JUGULAR VEIN;  Surgeon: Elam Dutch, MD;  Location: [place];  Service: Vascular;  Laterality: N/A;   MULTIPLE TOOTH EXTRACTIONS     PERIPHERAL VASCULAR BALLOON ANGIOPLASTY Right 01/23/2019   Procedure: PERIPHERAL VASCULAR BALLOON ANGIOPLASTY;  Surgeon: Ruta Hinds  E, MD;  Location: [place] CV LAB;  Service: Cardiovascular;  Laterality: Right;   REVISION OF ARTERIOVENOUS GORETEX GRAFT Right 03/29/2017   Procedure: REVISION OF ARTERIOVENOUS GORETEX GRAFT;  Surgeon: Conrad Albion, MD;  Location: [facility] OR;  Service: Vascular;  Laterality: Right;   Surgical procedure to remove a mole as a child Right Eye area   At around 37 years old     reports that he has never smoked. He has never used smokeless  tobacco. He reports that he does not drink alcohol and does not use drugs.  Allergies  Allergen Reactions   Influenza Virus Vaccine Other (See Comments)    Family History  Problem Relation Age of Onset   Hypertension Mother    Healthy Father    Hypertension Sister    Hypertension Brother      Prior to Admission medications   Medication Sig Start Date End Date Taking? Authorizing Provider  aspirin EC 81 MG EC tablet Take 1 tablet (81 mg total) by mouth daily. 04/23/15  Yes Reyne Dumas, MD  atorvastatin (LIPITOR) 40 MG tablet Take 1 tablet (40 mg total) by mouth daily at 6 PM. 04/23/15  Yes Reyne Dumas, MD  cyanocobalamin 500 MCG tablet Take 1 tablet (500 mcg total) by mouth daily. 12/26/14  Yes Donne Hazel, MD  dorzolamide-timolol (COSOPT) 22.3-6.8 MG/ML ophthalmic solution Place 1 drop into both eyes 2 (two) times daily. 05/05/21  Yes [provider]  gabapentin (NEURONTIN) 300 MG capsule Take 300 mg by mouth every evening.   Yes [provider]  latanoprost (XALATAN) 0.005 % ophthalmic solution Place 1 drop into both eyes at bedtime. 05/05/21  Yes [provider]  metroNIDAZOLE (FLAGYL) 500 MG/100ML Inject 100 mLs (500 mg total) into the vein once for 1 dose. 05/20/21 05/20/21 Yes Curatolo, Adam, DO  OXYGEN Inhale 2 L into the lungs daily.   Yes [provider]  sevelamer carbonate (RENVELA) 800 MG tablet Take 2 tablets (1,600 mg total) by mouth 3 (three) times daily with meals. 05/17/15  Yes Cherene Altes, MD  omeprazole (PRILOSEC) 20 MG capsule Take 20 mg by mouth daily.  07/31/15   [provider]    Physical Exam: Vitals:   05/20/21 1500 05/20/21 1600 05/20/21 1630 05/20/21 1713  BP: 130/60 (!) 151/64 (!) 156/65 (!) 151/63  Pulse:    66  Resp: (!) 0 (!) 21 16 (!) 21  Temp:      TempSrc:      SpO2:    98%  Weight:      Height:        Constitutional: NAD, calm, comfortable Vitals:   05/20/21 1500 05/20/21 1600 05/20/21  1630 05/20/21 1713  BP: 130/60 (!) 151/64 (!) 156/65 (!) 151/63  Pulse:    66  Resp: (!) 0 (!) 21 16 (!) 21  Temp:      TempSrc:      SpO2:    98%  Weight:      Height:       Eyes: PERRL, lids and conjunctivae normal ENMT: Mucous membranes are moist. Posterior pharynx clear of any exudate or lesions.Normal dentition.  Neck: normal, supple, no masses, no thyromegaly Respiratory: clear to auscultation bilaterally, no wheezing, no crackles. Normal respiratory effort. No accessory muscle use.  Cardiovascular: Regular rate and rhythm, no murmurs / rubs / gallops. No extremity edema. 2+ pedal pulses. No carotid bruits.  Abdomen: mild tenderness on periumbilical area, no rebound no guarding, no masses  palpated. No hepatosplenomegaly. Bowel sounds positive.  Musculoskeletal: no clubbing / cyanosis. No joint deformity upper and lower extremities. Good ROM, no contractures. Normal muscle tone.  Skin: no rashes, lesions, ulcers. No induration Neurologic: CN 2-12 grossly intact. Sensation intact, DTR normal. Strength 5/5 in all 4.  Psychiatric: Awake and alert oriented x3, lethargic.   Labs on Admission: I have personally reviewed following labs and imaging studies  CBC: Recent Labs  Lab 05/20/21 1310 05/20/21 1341  WBC 7.5  --   NEUTROABS 5.5  --   HGB 13.2 13.9  HCT 44.0 41.0  MCV 102.1*  --   PLT 147*  --    Basic Metabolic Panel: Recent Labs  Lab 05/20/21 1310 05/20/21 1341  NA 138 136  K 4.5 6.6*  CL 92*  --   CO2 30  --   GLUCOSE 162*  --   BUN 22  --   CREATININE 8.37*  --   CALCIUM 9.4  --    GFR: Estimated Creatinine Clearance: 7.4 mL/min (A) (by C-G formula based on SCr of 8.37 mg/dL (H)). Liver Function Tests: Recent Labs  Lab 05/20/21 1310  AST 17  ALT 10  ALKPHOS 81  BILITOT 0.6  PROT 8.3*  ALBUMIN 4.1   No results for input(s): LIPASE, AMYLASE in the last 168 hours. No results for input(s): AMMONIA in the last 168 hours. Coagulation Profile: No  results for input(s): INR, PROTIME in the last 168 hours. Cardiac Enzymes: No results for input(s): CKTOTAL, CKMB, CKMBINDEX, TROPONINI in the last 168 hours. BNP (last 3 results) No results for input(s): PROBNP in the last 8760 hours. HbA1C: No results for input(s): HGBA1C in the last 72 hours. CBG: Recent Labs  Lab 05/20/21 1451 05/20/21 1459 05/20/21 1500 05/20/21 1535 05/20/21 1650  GLUCAP 33* 55* 35* 173* 126*   Lipid Profile: No results for input(s): CHOL, HDL, LDLCALC, TRIG, CHOLHDL, LDLDIRECT in the last 72 hours. Thyroid Function Tests: No results for input(s): TSH, T4TOTAL, FREET4, T3FREE, THYROIDAB in the last 72 hours. Anemia Panel: No results for input(s): VITAMINB12, FOLATE, FERRITIN, TIBC, IRON, RETICCTPCT in the last 72 hours. Urine analysis:    Component Value Date/Time   COLORURINE AMBER (A) 01/29/2015 2200   APPEARANCEUR CLOUDY (A) 01/29/2015 2200   LABSPEC 1.013 01/29/2015 2200   PHURINE 5.0 01/29/2015 2200   GLUCOSEU NEGATIVE 01/29/2015 2200   HGBUR LARGE (A) 01/29/2015 2200   BILIRUBINUR NEGATIVE 01/29/2015 2200   KETONESUR NEGATIVE 01/29/2015 2200   PROTEINUR 100 (A) 01/29/2015 2200   UROBILINOGEN 0.2 01/29/2015 2200   NITRITE NEGATIVE 01/29/2015 2200   LEUKOCYTESUR NEGATIVE 01/29/2015 2200    Radiological Exams on Admission: CT HEAD WO CONTRAST (5MM)  Result Date: 05/20/2021 CLINICAL DATA:  Status post fall. In stage renal disease on hemodialysis. History of Sjogren syndrome. EXAM: CT HEAD WITHOUT CONTRAST CT CERVICAL SPINE WITHOUT CONTRAST TECHNIQUE: Multidetector CT imaging of the head and cervical spine was performed following the standard protocol without intravenous contrast. Multiplanar CT image reconstructions of the cervical spine were also generated. COMPARISON:  CT head 04/22/2015.  Chest CT 11/06/2015. FINDINGS: CT HEAD FINDINGS Brain: There is no evidence of acute intracranial hemorrhage, mass lesion, brain edema or extra-axial fluid  collection. Mildly progressive atrophy with prominence of the ventricles and subarachnoid spaces there is confluent low-density in the periventricular white matter which has progressed, likely due to progressive chronic small vessel ischemic changes. There is no CT evidence of acute cortical infarction. Vascular: Prominent intracranial vascular calcifications.  No hyperdense vessel identified. Skull: Negative for fracture or focal lesion. Sinuses/Orbits: The visualized paranasal sinuses and mastoid air cells are clear. No orbital abnormalities are seen. Other: None. CT CERVICAL SPINE FINDINGS Alignment: Normal Skull base and vertebrae: No evidence of acute fracture or traumatic subluxation. There are scattered tiny lucent lesions throughout the vertebral bodies which may relate to chronic renal failure/hyperparathyroidism. Appearance is similar to prior chest CT, making a neoplastic process unlikely. Soft tissues and spinal canal: No prevertebral fluid or swelling. No visible canal hematoma. Disc levels: Multilevel cervical spondylosis with loss of disc height, uncinate spurring and facet hypertrophy. Resulting mild foraminal narrowing at multiple levels. No large disc herniation identified. Upper chest: Unremarkable. Other: Mild bilateral carotid atherosclerosis. IMPRESSION: 1. No acute intracranial or calvarial findings. 2. Mildly progressive atrophy and chronic small vessel ischemic changes. 3. No evidence of acute cervical spine fracture, traumatic subluxation or static signs of instability. 4. Mild multilevel spondylosis. Electronically Signed   By: Richardean Sale M.D.   On: 05/20/2021 13:36   CT Cervical Spine Wo Contrast  Result Date: 05/20/2021 CLINICAL DATA:  Status post fall. In stage renal disease on hemodialysis. History of Sjogren syndrome. EXAM: CT HEAD WITHOUT CONTRAST CT CERVICAL SPINE WITHOUT CONTRAST TECHNIQUE: Multidetector CT imaging of the head and cervical spine was performed following the  standard protocol without intravenous contrast. Multiplanar CT image reconstructions of the cervical spine were also generated. COMPARISON:  CT head 04/22/2015.  Chest CT 11/06/2015. FINDINGS: CT HEAD FINDINGS Brain: There is no evidence of acute intracranial hemorrhage, mass lesion, brain edema or extra-axial fluid collection. Mildly progressive atrophy with prominence of the ventricles and subarachnoid spaces there is confluent low-density in the periventricular white matter which has progressed, likely due to progressive chronic small vessel ischemic changes. There is no CT evidence of acute cortical infarction. Vascular: Prominent intracranial vascular calcifications. No hyperdense vessel identified. Skull: Negative for fracture or focal lesion. Sinuses/Orbits: The visualized paranasal sinuses and mastoid air cells are clear. No orbital abnormalities are seen. Other: None. CT CERVICAL SPINE FINDINGS Alignment: Normal Skull base and vertebrae: No evidence of acute fracture or traumatic subluxation. There are scattered tiny lucent lesions throughout the vertebral bodies which may relate to chronic renal failure/hyperparathyroidism. Appearance is similar to prior chest CT, making a neoplastic process unlikely. Soft tissues and spinal canal: No prevertebral fluid or swelling. No visible canal hematoma. Disc levels: Multilevel cervical spondylosis with loss of disc height, uncinate spurring and facet hypertrophy. Resulting mild foraminal narrowing at multiple levels. No large disc herniation identified. Upper chest: Unremarkable. Other: Mild bilateral carotid atherosclerosis. IMPRESSION: 1. No acute intracranial or calvarial findings. 2. Mildly progressive atrophy and chronic small vessel ischemic changes. 3. No evidence of acute cervical spine fracture, traumatic subluxation or static signs of instability. 4. Mild multilevel spondylosis. Electronically Signed   By: Richardean Sale M.D.   On: 05/20/2021 13:36   DG  Chest Port 1 View  Result Date: 05/20/2021 CLINICAL DATA:  Questionable sepsis EXAM: PORTABLE CHEST 1 VIEW COMPARISON:  Chest radiograph 10/16/2020 FINDINGS: The heart is at the upper limits of normal for size, increased compared to the prior chest radiograph. The mediastinal contours are within normal limits. There is calcified atherosclerotic plaque of the aortic arch. A right subclavian vascular stent is again noted. There is no focal consolidation or pulmonary edema. There is no pleural effusion or pneumothorax. There is no acute osseous abnormality. IMPRESSION: Increased size of the cardiac silhouette compared to the study from 10/16/2020.  Given the patient has had pericardial effusions in the past, consider correlation with ECHO. Electronically Signed   By: Valetta Mole M.D.   On: 05/20/2021 13:39   CT CHEST ABDOMEN PELVIS WO CONTRAST  Result Date: 05/20/2021 CLINICAL DATA:  Fever. EXAM: CT CHEST, ABDOMEN AND PELVIS WITHOUT CONTRAST TECHNIQUE: Multidetector CT imaging of the chest, abdomen and pelvis was performed following the standard protocol without IV contrast. COMPARISON:  October 17, 2015. FINDINGS: CT CHEST FINDINGS Cardiovascular: Atherosclerosis of thoracic aorta is noted without aneurysm formation. Normal cardiac size. No pericardial effusion. Coronary artery calcifications are noted. Mediastinum/Nodes: No enlarged mediastinal, hilar, or axillary lymph nodes. Thyroid gland, trachea, and esophagus demonstrate no significant findings. Lungs/Pleura: No pneumothorax or pleural effusion is noted. Minimal bibasilar subsegmental atelectasis or scarring is noted. Musculoskeletal: No chest wall mass or suspicious bone lesions identified. CT ABDOMEN PELVIS FINDINGS Hepatobiliary: Cholelithiasis is noted. No biliary dilatation is noted. The liver is unremarkable. Pancreas: Unremarkable. No pancreatic ductal dilatation or surrounding inflammatory changes. Spleen: Normal in size without focal abnormality.  Adrenals/Urinary Tract: Adrenal glands appear normal. Multiple small bilateral renal cysts are noted. No hydronephrosis or renal obstruction is noted. No renal or ureteral calculi are noted. Urinary bladder is unremarkable. Stomach/Bowel: The stomach appears normal. There is no evidence of bowel obstruction or inflammation. Vascular/Lymphatic: Aortic atherosclerosis. No enlarged abdominal or pelvic lymph nodes. IVC filter is in infrarenal position. Reproductive: Moderate prostatic enlargement is noted. Other: No abdominal wall hernia or abnormality. No abdominopelvic ascites. Musculoskeletal: No acute or significant osseous findings. IMPRESSION: Cholelithiasis. Coronary artery calcifications are noted suggesting coronary artery disease. Multiple bilateral renal cysts are noted. Moderate prostatic enlargement. No acute abnormality seen in the chest, abdomen or pelvis. Aortic Atherosclerosis (ICD10-I70.0). Electronically Signed   By: Marijo Conception M.D.   On: 05/20/2021 16:02    EKG: Independently reviewed.  Sinus, no acute ST changes, frequent PVCs.  Assessment/Plan Active Problems:   Hypoglycemia  (please populate well all problems here in Problem List. (For example, if patient is on BP meds at home and you resume or decide to hold them, it is a problem that needs to be her. Same for CAD, COPD, HLD and so on)  Sepsis -Evidenced by the onset of fever, elevated lactic acid level, and organ damage of encephalopathy, unknown source, rule out bacteremia. -Low risk for Pseudomonas at this point, agreed with vancomycin plus ceftriaxone for now.  Blood cultures x2.  Hypoglycemia -No history of diabetes -Discussed with wife, who has diabetes and taking oral diabetes medications, however wife confident that patient is not taking any of diabetic medications. -Check A1c, insulin level, cortisol level -Continue D10 for tonight.  Acute metabolic encephalopathy -Multifactorial from fever and hypoglycemia -CT  head reassuring. -Denied any neck pain or headache, no meningeal sign on physical exam, low suspicious for meningitis.  COVID infection -No respiratory symptoms. -Fever and mentation changes and lethargic less likely from COVID infection given the onset is sudden and lacking other usually accompanying symptoms or clear image evidence of viral PNA. -D/W pharmacist about monoclonal antibody versus remdesivir, then discussed with patient wife over the treatment options, wife prefer not to start [place] treatment this time. Will hold off [place] labs. -If patient continue to have fever despite antibacterial treatment, or develop other COVID symptoms, will consider start anti-COVID treatment.  ESRD on HD -Appears to be euvolemic, will consult nephrology in the morning.  Chronic hypoxic respiratory failure -Secondary to pulmonary fibrosis, no symptoms or signs  of pneumonia, oxygen level stable.  DVT prophylaxis: Heparin subcu Code Status: Full code Family Communication: Wife at bedside Disposition Plan: Expect more than 2 midnight hospital stay to treat sepsis. Consults called: None Admission status: PCU   Lequita Halt MD [facility] Pager [phone]  05/20/2021, 6:13 PM

## 2021-05-20 NOTE — Sepsis Progress Note (Signed)
eLink is monitoring this Code Sepsis. °

## 2021-05-21 ENCOUNTER — Encounter (HOSPITAL_COMMUNITY): Payer: Self-pay | Admitting: Internal Medicine

## 2021-05-21 ENCOUNTER — Other Ambulatory Visit: Payer: Self-pay

## 2021-05-21 DIAGNOSIS — U071 COVID-19: Secondary | ICD-10-CM

## 2021-05-21 DIAGNOSIS — N186 End stage renal disease: Secondary | ICD-10-CM

## 2021-05-21 LAB — FERRITIN: Ferritin: 1446 ng/mL — ABNORMAL HIGH (ref 24–336)

## 2021-05-21 LAB — C-REACTIVE PROTEIN: CRP: 0.9 mg/dL (ref <1.0)

## 2021-05-21 LAB — GLUCOSE, CAPILLARY
Glucose-Capillary: 38 mg/dL — CL (ref 70–99)
Glucose-Capillary: 95 mg/dL (ref 70–99)
Glucose-Capillary: 53 mg/dL — ABNORMAL LOW (ref 70–99)
Glucose-Capillary: 33 mg/dL — CL (ref 70–99)
Glucose-Capillary: 71 mg/dL (ref 70–99)
Glucose-Capillary: 175 mg/dL — ABNORMAL HIGH (ref 70–99)

## 2021-05-21 LAB — PHOSPHORUS: Phosphorus: 5.2 mg/dL — ABNORMAL HIGH (ref 2.5–4.6)

## 2021-05-21 LAB — BASIC METABOLIC PANEL
Anion gap: 17 — ABNORMAL HIGH (ref 5–15)
BUN: 27 mg/dL — ABNORMAL HIGH (ref 8–23)
CO2: 30 mmol/L (ref 22–32)
Calcium: 9 mg/dL (ref 8.9–10.3)
Chloride: 88 mmol/L — ABNORMAL LOW (ref 98–111)
Creatinine, Ser: 9.59 mg/dL — ABNORMAL HIGH (ref 0.61–1.24)
GFR, Estimated: 5 mL/min — ABNORMAL LOW (ref >60)
Glucose, Bld: 99 mg/dL (ref 70–99)
Potassium: 4.3 mmol/L (ref 3.5–5.1)
Sodium: 135 mmol/L (ref 135–145)

## 2021-05-21 LAB — CBG MONITORING, ED
Glucose-Capillary: 87 mg/dL (ref 70–99)
Glucose-Capillary: 28 mg/dL — CL (ref 70–99)
Glucose-Capillary: 98 mg/dL (ref 70–99)
Glucose-Capillary: 38 mg/dL — CL (ref 70–99)
Glucose-Capillary: 16 mg/dL — CL (ref 70–99)
Glucose-Capillary: 70 mg/dL (ref 70–99)

## 2021-05-21 LAB — CBC
HCT: 40.2 % (ref 39.0–52.0)
Hemoglobin: 12 g/dL — ABNORMAL LOW (ref 13.0–17.0)
MCH: 30.5 pg (ref 26.0–34.0)
MCHC: 29.9 g/dL — ABNORMAL LOW (ref 30.0–36.0)
MCV: 102.3 fL — ABNORMAL HIGH (ref 80.0–100.0)
Platelets: 104 10*3/uL — ABNORMAL LOW (ref 150–400)
RBC: 3.93 MIL/uL — ABNORMAL LOW (ref 4.22–5.81)
RDW: 16.6 % — ABNORMAL HIGH (ref 11.5–15.5)
WBC: 5.3 10*3/uL (ref 4.0–10.5)
nRBC: 0 % (ref 0.0–0.2)

## 2021-05-21 LAB — D-DIMER, QUANTITATIVE: D-Dimer, Quant: 2.31 ug/mL-FEU — ABNORMAL HIGH (ref 0.00–0.50)

## 2021-05-21 LAB — HEMOGLOBIN A1C
Hgb A1c MFr Bld: 4.7 % — ABNORMAL LOW (ref 4.8–5.6)
Mean Plasma Glucose: 88.19 mg/dL

## 2021-05-21 LAB — FIBRINOGEN: Fibrinogen: 363 mg/dL (ref 210–475)

## 2021-05-21 LAB — GLUCOSE, RANDOM
Glucose, Bld: 111 mg/dL — ABNORMAL HIGH (ref 70–99)
Glucose, Bld: 63 mg/dL — ABNORMAL LOW (ref 70–99)

## 2021-05-21 LAB — PROCALCITONIN: Procalcitonin: 0.32 ng/mL

## 2021-05-21 LAB — MAGNESIUM: Magnesium: 2.2 mg/dL (ref 1.7–2.4)

## 2021-05-21 LAB — HEPATITIS B SURFACE ANTIGEN: Hepatitis B Surface Ag: NONREACTIVE

## 2021-05-21 LAB — CORTISOL: Cortisol, Plasma: 12.8 ug/dL

## 2021-05-21 LAB — BRAIN NATRIURETIC PEPTIDE: B Natriuretic Peptide: 502.4 pg/mL — ABNORMAL HIGH (ref 0.0–100.0)

## 2021-05-21 LAB — LACTATE DEHYDROGENASE: LDH: 193 U/L — ABNORMAL HIGH (ref 98–192)

## 2021-05-21 MED ORDER — DEXTROSE 50 % IV SOLN
1.0000 | Freq: Once | INTRAVENOUS | Status: AC
  Administered 2021-05-21: 50 mL via INTRAVENOUS
  Filled 2021-05-21: qty 50

## 2021-05-21 MED ORDER — SODIUM CHLORIDE 0.9 % IV SOLN
100.0000 mg | Freq: Every day | INTRAVENOUS | Status: DC
  Administered 2021-05-22 – 2021-05-24 (×3): 100 mg via INTRAVENOUS
  Filled 2021-05-21 (×3): qty 20
  Filled 2021-05-21: qty 100

## 2021-05-21 MED ORDER — HYDRALAZINE HCL 20 MG/ML IJ SOLN
10.0000 mg | Freq: Four times a day (QID) | INTRAMUSCULAR | Status: DC | PRN
  Administered 2021-05-21: 10 mg via INTRAVENOUS
  Filled 2021-05-21 (×3): qty 1

## 2021-05-21 MED ORDER — CINACALCET HCL 30 MG PO TABS
90.0000 mg | ORAL_TABLET | ORAL | Status: DC
  Administered 2021-05-25: 90 mg via ORAL
  Filled 2021-05-21: qty 3

## 2021-05-21 MED ORDER — DEXTROSE 50 % IV SOLN
1.0000 | INTRAVENOUS | Status: DC | PRN
  Administered 2021-05-21 – 2021-05-22 (×2): 50 mL via INTRAVENOUS

## 2021-05-21 MED ORDER — VANCOMYCIN HCL 750 MG/150ML IV SOLN: 750.0000 mg | INTRAVENOUS | Status: DC

## 2021-05-21 MED ORDER — DEXTROSE 50 % IV SOLN
INTRAVENOUS | Status: AC
  Filled 2021-05-21: qty 50

## 2021-05-21 MED ORDER — SODIUM CHLORIDE 0.9 % IV SOLN
200.0000 mg | Freq: Once | INTRAVENOUS | Status: AC
  Administered 2021-05-21: 200 mg via INTRAVENOUS
  Filled 2021-05-21: qty 40

## 2021-05-21 NOTE — ED Notes (Signed)
Nurse report given to Janace Hoard, RN

## 2021-05-21 NOTE — ED Notes (Signed)
Dr Sloan Leiter notified about low blood surgar. Patient is sitting up eating denies any needs. IV dextrose infusing. Awaiting orders.

## 2021-05-21 NOTE — Progress Notes (Signed)
   05/21/21 2106  Assess: MEWS Score  BP (!) 179/87  Pulse Rate 72  ECG Heart Rate 74  Resp (!) 22  SpO2 100 %  Assess: MEWS Score  MEWS Temp 1  MEWS Systolic 0  MEWS Pulse 0  MEWS RR 1  MEWS LOC 0  MEWS Score 2  MEWS Score Color Yellow  Assess: if the MEWS score is Yellow or Red  Were vital signs taken at a resting state? Yes  Focused Assessment No change from prior assessment  Early Detection of Sepsis Score *See Row Information* Low  MEWS guidelines implemented *See Row Information* Yes  Treat  MEWS Interventions Other (Comment) (monitor vs;Q1hour)  Pain Scale 0-10  Pain Score 0  Take Vital Signs  Increase Vital Sign Frequency  Yellow: Q 2hr X 2 then Q 4hr X 2, if remains yellow, continue Q 4hrs  Escalate  MEWS: Escalate Yellow: discuss with charge nurse/RN and consider discussing with provider and RRT  Notify: Charge Nurse/RN  Name of Charge Nurse/RN Notified Colletta [place]  Date Charge Nurse/RN Notified 05/21/21  Time Charge Nurse/RN Notified 2114

## 2021-05-21 NOTE — Progress Notes (Signed)
Pt finger stick CBG 53. IV found to be out and IV fluids infusing onto bed. PO OJ provided to patient until IV initiation. MD aware

## 2021-05-21 NOTE — ED Notes (Signed)
Pt given peanut butter, juice, and crackers. RN notified of CBG of 16.

## 2021-05-21 NOTE — Progress Notes (Signed)
Pt CBG finger stick= 39. MD notified and asked permission to do ear stick. Ear stick CBG = 95. STAT blood glucose order placed. Will continue to monitor   Phoebe Sharps, RN

## 2021-05-21 NOTE — ED Notes (Addendum)
Walked into pt's room to give 2nd dose of eye drops. Discovered pt had removed his 20 G IV on his left hand, leads taken off, gown, and pulse ox removed. Pt does not recall doing any of this, family(wife) at the bedside was asleep in chair when it occurred. States he does not normally act like this and has no hx of confusion. Leads, pulse ox replaced. Left hand cleaned

## 2021-05-21 NOTE — Progress Notes (Signed)
Pt arrived to unit from ED, A/O x 4,  Pt Bp elevated MD notified Prn med given. CCMD called ,CHG given, pt oriented to unit,Will continue to monitor.   Albin Felling Veretta Sabourin, RN    05/21/21 1452  Vitals  Temp 98.9 F (37.2 C)  Temp Source Oral  BP (!) 183/66  MAP (mmHg) 100  BP Location Right Arm  BP Method Automatic  Patient Position (if appropriate) Lying  Pulse Rate 60  Pulse Rate Source Monitor  ECG Heart Rate 63  Resp 17  Level of Consciousness  Level of Consciousness Alert  Oxygen Therapy  SpO2 97 %  O2 Device Room Air  Patient Activity (if Appropriate) In bed  Pulse Oximetry Type Continuous  Pain Assessment  Pain Scale 0-10  Pain Score 0  MEWS Score  MEWS Temp 0  MEWS Systolic 0  MEWS Pulse 0  MEWS RR 0  MEWS LOC 0  MEWS Score 0  MEWS Score Color Green

## 2021-05-21 NOTE — Progress Notes (Signed)
Inpatient Diabetes Program Recommendations  AACE/ADA: New Consensus Statement on Inpatient Glycemic Control   Target Ranges:  Prepandial:   less than 140 mg/dL      Peak postprandial:   less than 180 mg/dL (1-2 hours)      Critically ill patients:  140 - 180 mg/dL  Results for Travis, [name] (MRN [id number]) as of 05/21/2021 11:11  Ref. Range 05/21/2021 04:34 05/21/2021 08:07 05/21/2021 09:44  Glucose-Capillary Latest Ref Range: 70 - 99 mg/dL 87 28 (LL) 98   Results for Travis, [name] (MRN [id number]) as of 05/21/2021 11:11  Ref. Range 05/20/2021 12:13 05/20/2021 12:28 05/20/2021 13:30 05/20/2021 14:51 05/20/2021 14:59 05/20/2021 15:00 05/20/2021 15:35 05/20/2021 16:50 05/20/2021 22:05  Glucose-Capillary Latest Ref Range: 70 - 99 mg/dL 29 (LL) 254 (H) 107 (H) 33 (LL) 55 (L) 35 (LL) 173 (H) 126 (H) 84  Results for [name], [name] (MRN [id number]) as of 05/21/2021 11:11  Ref. Range 05/21/2021 04:27  Hemoglobin A1C Latest Ref Range: 4.8 - 5.6 % 4.7 (L)   Review of Glycemic Control  Diabetes history: NO Outpatient Diabetes medications: NA Current orders for Inpatient glycemic control: Novolog 0-6 units TID; D10 @ 75 ml/hr  Inpatient Diabetes Program Recommendations:    Insulin: Noted recurrent hypoglycemia; no insulin given since admitted. Would recommend discontinuing Novolog correction scale and just order CBGs Q4H or AC&HS.  Thanks, Barnie Alderman, RN, MSN, CDE Diabetes Coordinator Inpatient Diabetes Program [phone] (Team Pager from 8am to 5pm)

## 2021-05-21 NOTE — ED Notes (Signed)
This RN resumed care of this patient. As appeared on patient chart pt had many labs to be drawn. It is unclear if labs were drawn and sent. Phlebotomy was not aware that pt needed labs as they were told RN was getting. This RN was told that pt was hard stick. This RN confirmed with phlebotomy what labs patient needs. This RN collected labs from patient and collected a CBG.

## 2021-05-21 NOTE — Progress Notes (Signed)
PROGRESS NOTE                                                                                                                                                                                                             Patient Demographics:    Travis [name], is a 80 y.o. male, DOB - August 08, 1941, [id number]  Outpatient Primary MD for the patient is [name], [place], MD   Admit date - 05/20/2021   LOS - 1  Chief Complaint  Patient presents with   Hypoglycemia   Fall       Brief Narrative: Patient is a 80 y.o. 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  Patient was found to be COVID-19 positive-he was subsequently admitted to the hospitalist service.  COVID-19 vaccinated status: Vaccinated  Significant Events: 9/7>> Admit to [facility] for fever/AMS/hypoglycemia/COVID-19 infection  Significant studies: 9/7>> CT head: No acute intracranial abnormalities 9/7>> CT C-spine: No fracture/subluxation 9/7>> CT chest: No pneumonia 9/8>> CT abdomen/pelvis: Cholelithiasis, moderate prostate enlargement.  COVID-19 medications: Remdesivir: 9/8>>  Antibiotics: Vancomycin: 9/7 x 1 Rocephin: 9/7 x 1  Microbiology data: 9/7>> COVID PCR: Positive (CT value 28.7) 9/7 >>blood culture: No growth  Procedures: None  Consults: Nephrology  DVT prophylaxis: heparin injection 5,000 Units Start: 05/20/21 2200    Subjective:    [name] today is awake/alert-answers most questions appropriately.  He remains hypoglycemic.   Assessment  & Plan :   Sepsis: Suspect this is from COVID-19 infection-will start Remdesivir.  Blood cultures negative so far.  CT imaging negative for any other sources of infection.  Stop vancomycin/Rocephin  and monitor off antibiotics.  COVID-19 infection: At risk for severe disease-no pneumonia on imaging-no worsening of baseline hypoxemia-we will start Remdesivir.   Recent Labs    05/21/21 0427  DDIMER 2.31*  FERRITIN 1,446*  LDH 193*  CRP 0.9   Acute metabolic encephalopathy: Suspect this is from hypoglycemia-mentation improving as CBGs are stabilizing.  CT imaging negative for any acute abnormalities.  Hypoglycemia: Unclear etiology-per family-spouse has diabetes-but do not think that the patient could have inadvertently taken her medication.  If hypoglycemia persists-we will send out sulfonylureas panel/ c peptide etc  Recent  Labs    05/21/21 0434 05/21/21 0807 05/21/21 0944  GLUCAP 87 28* 98     ESRD: Have consulted nephrology for HD  HLD: Statin  History of pulmonary fibrosis-on chronic hypoxic respiratory failure: Seems stable  GI prophylaxis: PPI ABG:    Component Value Date/Time   PHART 7.499 (H) 04/19/2015 0534   PCO2ART 28.3 (L) 04/19/2015 0534   PO2ART 41.0 (L) 04/19/2015 0534   HCO3 32.3 (H) 05/20/2021 1341   TCO2 34 (H) 05/20/2021 1341   O2SAT 50.0 05/20/2021 1341    Vent Settings: N/A   Condition - Stable  Family Communication  :  Spouse-[name]- [phone] over the phone-patient's daughter answered the phone-spouse was listening in.  Code Status :  Full Code  Diet :  Diet Order             Diet Heart Room service appropriate? Yes; Fluid consistency: Thin  Diet effective now                    Disposition Plan  :   Status is: Inpatient  Remains inpatient appropriate because:Inpatient level of care appropriate due to severity of illness  Dispo: The patient is from: Home              Anticipated d/c is to: Home              Patient currently is not medically stable to d/c.   Difficult to place patient No    Barriers to discharge: Hypoxia requiring O2 supplementation/complete 5 days of IV Remdesivir  Antimicorbials  :     Anti-infectives (From admission, onward)    Start     Dose/Rate Route Frequency Ordered Stop   05/22/21 1200  vancomycin (VANCOREADY) IVPB 750 mg/150 mL        750 mg 150 mL/hr over 60 Minutes Intravenous Every M-W-F (Hemodialysis) 05/21/21 0737     05/20/21 2059  vancomycin variable dose per unstable renal function (pharmacist dosing)  Status:  Discontinued         Does not apply See admin instructions 05/20/21 2059 05/21/21 0737   05/20/21 1545  vancomycin (VANCOREADY) IVPB 1500 mg/300 mL        1,500 mg 150 mL/hr over 120 Minutes Intravenous  Once 05/20/21 1546 05/20/21 1925   05/20/21 1245  cefTRIAXone (ROCEPHIN) 2 g in sodium chloride 0.9 % 100 mL IVPB        2 g 200 mL/hr over 30 Minutes Intravenous Every 24 hours 05/20/21 1232 05/25/21 1244   05/20/21 0000  metroNIDAZOLE (FLAGYL) 500 MG/100ML        500 mg Intravenous  Once 05/20/21 1521 05/20/21 2359       Inpatient Medications  Scheduled Meds:  aspirin EC  81 mg Oral Daily   atorvastatin  40 mg Oral q1800   dorzolamide-timolol  1 drop Both Eyes BID   gabapentin  300 mg Oral QPM   heparin  5,000 Units Subcutaneous Q12H   insulin aspart  0-6 Units Subcutaneous TID WC   latanoprost  1 drop Both Eyes QHS   pantoprazole  40 mg Oral Daily   sevelamer carbonate  1,600 mg Oral TID WC   vitamin B-12  500 mcg Oral Daily   Continuous Infusions:  cefTRIAXone (ROCEPHIN)  IV Stopped (05/20/21 1431)   dextrose 75 mL/hr at 05/21/21 0531   [START ON 05/22/2021] vancomycin     PRN Meds:.acetaminophen **OR** acetaminophen, ondansetron **OR** ondansetron (ZOFRAN) IV   Time Spent in  minutes  35    See all Orders from today for further details   Oren Binet M.D on 05/21/2021 at 10:57 AM  To page go to [email or web address] - use universal password  [facility] -  Office  [phone]    Objective:   Vitals:   05/21/21 0500 05/21/21 0600 05/21/21 0800 05/21/21 0900  BP: 137/65 (!) 158/65 (!) 167/71 (!) 172/65  Pulse:  (!) 58 60  60  Resp: '18 13 16 '$ (!) 21  Temp:    98.2 F (36.8 C)  TempSrc:    Oral  SpO2: 100% 95%  97%  Weight:      Height:        Wt Readings from Last 3 Encounters:  05/20/21 75 kg  03/10/21 75 kg  12/09/20 74 kg    No intake or output data in the 24 hours ending 05/21/21 1057   Physical Exam Gen Exam:Alert awake-not in any distress HEENT:atraumatic, normocephalic Chest: B/L clear to auscultation anteriorly CVS:S1S2 regular Abdomen:soft non tender, non distended Extremities:no edema Neurology: Non focal Skin: no rash   Data Review:    CBC Recent Labs  Lab 05/20/21 1310 05/20/21 1341 05/21/21 0427  WBC 7.5  --  5.3  HGB 13.2 13.9 12.0*  HCT 44.0 41.0 40.2  PLT 147*  --  104*  MCV 102.1*  --  102.3*  MCH 30.6  --  30.5  MCHC 30.0  --  29.9*  RDW 16.6*  --  16.6*  LYMPHSABS 0.6*  --   --   MONOABS 1.3*  --   --   EOSABS 0.1  --   --   BASOSABS 0.0  --   --     Chemistries  Recent Labs  Lab 05/20/21 1310 05/20/21 1341 05/21/21 0427  NA 138 136 135  K 4.5 6.6* 4.3  CL 92*  --  88*  CO2 30  --  30  GLUCOSE 162*  --  99  BUN 22  --  27*  CREATININE 8.37*  --  9.59*  CALCIUM 9.4  --  9.0  MG  --   --  2.2  AST 17  --   --   ALT 10  --   --   ALKPHOS 81  --   --   BILITOT 0.6  --   --    ------------------------------------------------------------------------------------------------------------------ No results for input(s): CHOL, HDL, LDLCALC, TRIG, CHOLHDL, LDLDIRECT in the last 72 hours.  Lab Results  Component Value Date   HGBA1C 4.7 (L) 05/21/2021   ------------------------------------------------------------------------------------------------------------------ No results for input(s): TSH, T4TOTAL, T3FREE, THYROIDAB in the last 72 hours.  Invalid input(s): FREET3 ------------------------------------------------------------------------------------------------------------------ Recent Labs    05/21/21 0427  FERRITIN 1,446*     Coagulation profile No results for input(s): INR, PROTIME in the last 168 hours.  Recent Labs    05/21/21 0427  DDIMER 2.31*    Cardiac Enzymes No results for input(s): CKMB, TROPONINI, MYOGLOBIN in the last 168 hours.  Invalid input(s): CK ------------------------------------------------------------------------------------------------------------------    Component Value Date/Time   BNP 502.4 (H) 05/21/2021 0427    Micro Results Recent Results (from the past 240 hour(s))  Blood Culture (routine x 2)     Status: None (Preliminary result)   Collection Time: 05/20/21  1:10 PM   Specimen: BLOOD LEFT ARM  Result Value Ref Range Status   Specimen Description BLOOD LEFT ARM  Final   Special Requests   Final    BOTTLES DRAWN AEROBIC AND ANAEROBIC Blood Culture  results may not be optimal due to an inadequate volume of blood received in culture bottles   Culture   Final    NO GROWTH < 24 HOURS Performed at [facility] [address], [place], [place] [address]    Report Status PENDING  Incomplete  Resp Panel by RT-PCR (Flu A&B, Covid) Nasopharyngeal Swab     Status: Abnormal   Collection Time: 05/20/21  2:40 PM   Specimen: Nasopharyngeal Swab; Nasopharyngeal(NP) swabs in vial transport medium  Result Value Ref Range Status   SARS Coronavirus 2 by RT PCR POSITIVE (A) NEGATIVE Final    Comment: RESULT CALLED TO, READ BACK BY AND VERIFIED WITH: KASEY CHAPMAN,RN 05/20/2021 AT 1658 BY PAW. (NOTE) SARS-CoV-2 target nucleic acids are DETECTED.  The SARS-CoV-2 RNA is generally detectable in upper respiratory specimens during the acute phase of infection. Positive results are indicative of the presence of the identified virus, but do not rule out bacterial infection or co-infection with other pathogens not detected by the test. Clinical correlation with patient history and other diagnostic information is necessary to determine patient infection status. The expected result  is Negative.  Fact Sheet for Patients: EntrepreneurPulse.com.au  Fact Sheet for Healthcare Providers: IncredibleEmployment.be  This test is not yet approved or cleared by the [place] FDA and  has been authorized for detection and/or diagnosis of SARS-CoV-2 by FDA under an Emergency Use Authorization (EUA).  This EUA will remain in effect (meaning this t est can be used) for the duration of  the COVID-19 declaration under Section 564(b)(1) of the Act, 21 U.S.C. section 360bbb-3(b)(1), unless the authorization is terminated or revoked sooner.     Influenza A by PCR NEGATIVE NEGATIVE Final   Influenza B by PCR NEGATIVE NEGATIVE Final    Comment: (NOTE) The Xpert Xpress SARS-CoV-2/FLU/RSV plus assay is intended as an aid in the diagnosis of influenza from Nasopharyngeal swab specimens and should not be used as a sole basis for treatment. Nasal washings and aspirates are unacceptable for Xpert Xpress SARS-CoV-2/FLU/RSV testing.  Fact Sheet for Patients: EntrepreneurPulse.com.au  Fact Sheet for Healthcare Providers: IncredibleEmployment.be  This test is not yet approved or cleared by the [place] FDA and has been authorized for detection and/or diagnosis of SARS-CoV-2 by FDA under an Emergency Use Authorization (EUA). This EUA will remain in effect (meaning this test can be used) for the duration of the COVID-19 declaration under Section 564(b)(1) of the Act, 21 U.S.C. section 360bbb-3(b)(1), unless the authorization is terminated or revoked.  Performed at [facility], [place] [address] [place], [place] [address]     Radiology Reports CT HEAD WO CONTRAST (5MM)  Result Date: 05/20/2021 CLINICAL DATA:  Status post fall. In stage renal disease on hemodialysis. History of Sjogren syndrome. EXAM: CT HEAD WITHOUT CONTRAST CT CERVICAL SPINE WITHOUT CONTRAST TECHNIQUE: Multidetector CT imaging of  the head and cervical spine was performed following the standard protocol without intravenous contrast. Multiplanar CT image reconstructions of the cervical spine were also generated. COMPARISON:  CT head 04/22/2015.  Chest CT 11/06/2015. FINDINGS: CT HEAD FINDINGS Brain: There is no evidence of acute intracranial hemorrhage, mass lesion, brain edema or extra-axial fluid collection. Mildly progressive atrophy with prominence of the ventricles and subarachnoid spaces there is confluent low-density in the periventricular white matter which has progressed, likely due to progressive chronic small vessel ischemic changes. There is no CT evidence of acute cortical infarction. Vascular: Prominent intracranial vascular calcifications. No hyperdense vessel identified. Skull: Negative  for fracture or focal lesion. Sinuses/Orbits: The visualized paranasal sinuses and mastoid air cells are clear. No orbital abnormalities are seen. Other: None. CT CERVICAL SPINE FINDINGS Alignment: Normal Skull base and vertebrae: No evidence of acute fracture or traumatic subluxation. There are scattered tiny lucent lesions throughout the vertebral bodies which may relate to chronic renal failure/hyperparathyroidism. Appearance is similar to prior chest CT, making a neoplastic process unlikely. Soft tissues and spinal canal: No prevertebral fluid or swelling. No visible canal hematoma. Disc levels: Multilevel cervical spondylosis with loss of disc height, uncinate spurring and facet hypertrophy. Resulting mild foraminal narrowing at multiple levels. No large disc herniation identified. Upper chest: Unremarkable. Other: Mild bilateral carotid atherosclerosis. IMPRESSION: 1. No acute intracranial or calvarial findings. 2. Mildly progressive atrophy and chronic small vessel ischemic changes. 3. No evidence of acute cervical spine fracture, traumatic subluxation or static signs of instability. 4. Mild multilevel spondylosis. Electronically Signed    By: Richardean Sale M.D.   On: 05/20/2021 13:36   CT Cervical Spine Wo Contrast  Result Date: 05/20/2021 CLINICAL DATA:  Status post fall. In stage renal disease on hemodialysis. History of Sjogren syndrome. EXAM: CT HEAD WITHOUT CONTRAST CT CERVICAL SPINE WITHOUT CONTRAST TECHNIQUE: Multidetector CT imaging of the head and cervical spine was performed following the standard protocol without intravenous contrast. Multiplanar CT image reconstructions of the cervical spine were also generated. COMPARISON:  CT head 04/22/2015.  Chest CT 11/06/2015. FINDINGS: CT HEAD FINDINGS Brain: There is no evidence of acute intracranial hemorrhage, mass lesion, brain edema or extra-axial fluid collection. Mildly progressive atrophy with prominence of the ventricles and subarachnoid spaces there is confluent low-density in the periventricular white matter which has progressed, likely due to progressive chronic small vessel ischemic changes. There is no CT evidence of acute cortical infarction. Vascular: Prominent intracranial vascular calcifications. No hyperdense vessel identified. Skull: Negative for fracture or focal lesion. Sinuses/Orbits: The visualized paranasal sinuses and mastoid air cells are clear. No orbital abnormalities are seen. Other: None. CT CERVICAL SPINE FINDINGS Alignment: Normal Skull base and vertebrae: No evidence of acute fracture or traumatic subluxation. There are scattered tiny lucent lesions throughout the vertebral bodies which may relate to chronic renal failure/hyperparathyroidism. Appearance is similar to prior chest CT, making a neoplastic process unlikely. Soft tissues and spinal canal: No prevertebral fluid or swelling. No visible canal hematoma. Disc levels: Multilevel cervical spondylosis with loss of disc height, uncinate spurring and facet hypertrophy. Resulting mild foraminal narrowing at multiple levels. No large disc herniation identified. Upper chest: Unremarkable. Other: Mild bilateral  carotid atherosclerosis. IMPRESSION: 1. No acute intracranial or calvarial findings. 2. Mildly progressive atrophy and chronic small vessel ischemic changes. 3. No evidence of acute cervical spine fracture, traumatic subluxation or static signs of instability. 4. Mild multilevel spondylosis. Electronically Signed   By: Richardean Sale M.D.   On: 05/20/2021 13:36   DG Chest Port 1 View  Result Date: 05/20/2021 CLINICAL DATA:  Questionable sepsis EXAM: PORTABLE CHEST 1 VIEW COMPARISON:  Chest radiograph 10/16/2020 FINDINGS: The heart is at the upper limits of normal for size, increased compared to the prior chest radiograph. The mediastinal contours are within normal limits. There is calcified atherosclerotic plaque of the aortic arch. A right subclavian vascular stent is again noted. There is no focal consolidation or pulmonary edema. There is no pleural effusion or pneumothorax. There is no acute osseous abnormality. IMPRESSION: Increased size of the cardiac silhouette compared to the study from 10/16/2020. Given the patient has had pericardial  effusions in the past, consider correlation with ECHO. Electronically Signed   By: Valetta Mole M.D.   On: 05/20/2021 13:39   CT CHEST ABDOMEN PELVIS WO CONTRAST  Result Date: 05/20/2021 CLINICAL DATA:  Fever. EXAM: CT CHEST, ABDOMEN AND PELVIS WITHOUT CONTRAST TECHNIQUE: Multidetector CT imaging of the chest, abdomen and pelvis was performed following the standard protocol without IV contrast. COMPARISON:  October 17, 2015. FINDINGS: CT CHEST FINDINGS Cardiovascular: Atherosclerosis of thoracic aorta is noted without aneurysm formation. Normal cardiac size. No pericardial effusion. Coronary artery calcifications are noted. Mediastinum/Nodes: No enlarged mediastinal, hilar, or axillary lymph nodes. Thyroid gland, trachea, and esophagus demonstrate no significant findings. Lungs/Pleura: No pneumothorax or pleural effusion is noted. Minimal bibasilar subsegmental  atelectasis or scarring is noted. Musculoskeletal: No chest wall mass or suspicious bone lesions identified. CT ABDOMEN PELVIS FINDINGS Hepatobiliary: Cholelithiasis is noted. No biliary dilatation is noted. The liver is unremarkable. Pancreas: Unremarkable. No pancreatic ductal dilatation or surrounding inflammatory changes. Spleen: Normal in size without focal abnormality. Adrenals/Urinary Tract: Adrenal glands appear normal. Multiple small bilateral renal cysts are noted. No hydronephrosis or renal obstruction is noted. No renal or ureteral calculi are noted. Urinary bladder is unremarkable. Stomach/Bowel: The stomach appears normal. There is no evidence of bowel obstruction or inflammation. Vascular/Lymphatic: Aortic atherosclerosis. No enlarged abdominal or pelvic lymph nodes. IVC filter is in infrarenal position. Reproductive: Moderate prostatic enlargement is noted. Other: No abdominal wall hernia or abnormality. No abdominopelvic ascites. Musculoskeletal: No acute or significant osseous findings. IMPRESSION: Cholelithiasis. Coronary artery calcifications are noted suggesting coronary artery disease. Multiple bilateral renal cysts are noted. Moderate prostatic enlargement. No acute abnormality seen in the chest, abdomen or pelvis. Aortic Atherosclerosis (ICD10-I70.0). Electronically Signed   By: Marijo Conception M.D.   On: 05/20/2021 16:02

## 2021-05-21 NOTE — ED Notes (Signed)
Pt used bedside commode to have a bowel movement. Pt placed back in bed, given juice and lunch. RN notified of CBG of 38.

## 2021-05-21 NOTE — Consult Note (Signed)
Renal Service Consult Note [facility]  Jessica Ostrand 05/21/2021 Sol Blazing, MD Requesting Physician: Dr Sloan Leiter  Reason for Consult: ESRD pt w/ fever and hypoglycemia HPI: The patient is a 80 y.o. year-old w/ hx of ESRD on HD, chronic resp failure d/t pulm fibrosis, Sjogrens, neuropathy who presented to ED 9/07 w/ fevers, AMS and low BS's.  Pt had shortened HD on 9/07, was taken off after 2.5 hrs due to lethargy and low BS 29.  In ED glucose 29, temp 101.9, got IV D5 and D10 drip.  COVID test was positive. CxR showed ^'d sillhouette but no infiltrates or edema. CT abd showed gallstones and enlarged prostate. Pt admitted for hypoglycemia and r/o sepsis. Asked to see for ESRD.    Pt seen in ED room. No specific c/o's, coughing, no SOB or LE edema.     ROS - denies CP, no joint pain, no HA, no blurry vision, no rash, no diarrhea, no nausea/ vomiting   Past Medical History  Past Medical History:  Diagnosis Date   Anemia    Arthritis    Chronic combined systolic and diastolic CHF (congestive heart failure) ([place])    a. Etiology of low EF not defined.   Chronic respiratory failure (HCC)    DVT (deep venous thrombosis) ([place])    a. 01/2015: RLE DVT. VQ scan intermediate probability. Underwent renal bx complicated by perinephric hematoma; anticoagulation stopped and IVC filter placed.   ESRD on hemodialysis [facility]) June 2016   a. 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 Received plasma exchange and steroids but didn't respond and ended up starting hemodialysis June 2016.    Essential hypertension    GERD (gastroesophageal reflux disease)    History of nuclear stress test    Myoview 9/19 [facility]): low risk    Perinephric hematoma 01/2015   Pneumonia    Proctitis 07/2015   Protein calorie malnutrition ([place])    Pulmonary fibrosis ([place])    Sjogren's disease ([place]) 01/28/2015   Syncope    a. 04/2015 in HD in setting of BP  50s.   Unintentional weight loss    Wears partial dentures    Past Surgical History  Past Surgical History:  Procedure Laterality Date   AV FISTULA PLACEMENT Right 02/17/2015   Procedure: INSERTION OF RIGHT ARM  ARTERIOVENOUS (AV) GORE-TEX GRAFT ;  Surgeon: Elam Dutch, MD;  Location: [place];  Service: Vascular;  Laterality: Right;   FLEXIBLE SIGMOIDOSCOPY N/A 08/04/2015   Procedure: FLEXIBLE SIGMOIDOSCOPY;  Surgeon: Wilford Corner, MD;  Location: [facility] ENDOSCOPY;  Service: Endoscopy;  Laterality: N/A;   HEMORROIDECTOMY  1999   INSERTION OF DIALYSIS CATHETER N/A 02/17/2015   Procedure: INSERTION OF DIALYSIS CATHETER RIGHT INTERNAL JUGULAR VEIN;  Surgeon: Elam Dutch, MD;  Location: [place];  Service: Vascular;  Laterality: N/A;   MULTIPLE TOOTH EXTRACTIONS     PERIPHERAL VASCULAR BALLOON ANGIOPLASTY Right 01/23/2019   Procedure: PERIPHERAL VASCULAR BALLOON ANGIOPLASTY;  Surgeon: Elam Dutch, MD;  Location: [place] CV LAB;  Service: Cardiovascular;  Laterality: Right;   REVISION OF ARTERIOVENOUS GORETEX GRAFT Right 03/29/2017   Procedure: REVISION OF ARTERIOVENOUS GORETEX GRAFT;  Surgeon: Conrad Rennert, MD;  Location: [facility] OR;  Service: Vascular;  Laterality: Right;   Surgical procedure to remove a mole as a child Right Eye area   At around 46 years old   Family History  Family History  Problem Relation Age of Onset  Hypertension Mother    Healthy Father    Hypertension Sister    Hypertension Brother    Social History  reports that he has never smoked. He has never used smokeless tobacco. He reports that he does not drink alcohol and does not use drugs. Allergies  Allergies  Allergen Reactions   Influenza Virus Vaccine Other (See Comments)   Home medications Prior to Admission medications   Medication Sig Start Date End Date Taking? Authorizing Provider  aspirin EC 81 MG EC tablet Take 1 tablet (81 mg total) by mouth daily. 04/23/15  Yes Reyne Dumas, MD  atorvastatin  (LIPITOR) 40 MG tablet Take 1 tablet (40 mg total) by mouth daily at 6 PM. 04/23/15  Yes Reyne Dumas, MD  cyanocobalamin 500 MCG tablet Take 1 tablet (500 mcg total) by mouth daily. 12/26/14  Yes Donne Hazel, MD  dorzolamide-timolol (COSOPT) 22.3-6.8 MG/ML ophthalmic solution Place 1 drop into both eyes 2 (two) times daily. 05/05/21  Yes [provider]  gabapentin (NEURONTIN) 300 MG capsule Take 300 mg by mouth every evening.   Yes [provider]  latanoprost (XALATAN) 0.005 % ophthalmic solution Place 1 drop into both eyes at bedtime. 05/05/21  Yes [provider]  OXYGEN Inhale 2 L into the lungs daily.   Yes [provider]  sevelamer carbonate (RENVELA) 800 MG tablet Take 2 tablets (1,600 mg total) by mouth 3 (three) times daily with meals. 05/17/15  Yes Cherene Altes, MD  omeprazole (PRILOSEC) 20 MG capsule Take 20 mg by mouth daily.  07/31/15   [provider]     Vitals:   05/21/21 1100 05/21/21 1200 05/21/21 1423 05/21/21 1452  BP: (!) 141/62 (!) 155/70 (!) 152/72 (!) 183/66  Pulse:  71 70 60  Resp: '19 16 18 17  '$ Temp:   98.1 F (36.7 C) 98.9 F (37.2 C)  TempSrc:   Oral Oral  SpO2:  99% 99%   Weight:      Height:       Exam Gen alert, no distress No rash, cyanosis or gangrene Sclera anicteric, throat clear  No jvd or bruits Chest clear bilat to bases, no rales/ wheezing RRR no MRG Abd soft ntnd no mass or ascites +bs GU normal MS no joint effusions or deformity Ext no LE or UE edema, no wounds or ulcers Neuro is alert, Ox 3 , nf         Home meds include asa, lipitor, neurontin 300 hs, renvela 2 ac tid, prilosec , prns     OP HD: [facility] MWF  3.5h  400/1.5   74.5kg  2/2 bath  RUA AVG  Heparin none  - sensipar 90 tiw   Assessment/ Plan: Hypoglycemia - per primary team 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 On IV rocephin,  Vanc and Remdesivir.  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 Had > 1/2 of HD yesterday. Labs/ vol ok. HD tomorrow.  BP/volume -   BP's normal to high, no vol excess/ edema on exam. CXR no edema.  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

## 2021-05-21 NOTE — ED Notes (Signed)
One amp of D50 given. Patient is sitting up eating at this time.

## 2021-05-22 LAB — GLUCOSE, CAPILLARY
Glucose-Capillary: 136 mg/dL — ABNORMAL HIGH (ref 70–99)
Glucose-Capillary: 81 mg/dL (ref 70–99)
Glucose-Capillary: 96 mg/dL (ref 70–99)
Glucose-Capillary: 82 mg/dL (ref 70–99)
Glucose-Capillary: 52 mg/dL — ABNORMAL LOW (ref 70–99)
Glucose-Capillary: 159 mg/dL — ABNORMAL HIGH (ref 70–99)
Glucose-Capillary: 35 mg/dL — CL (ref 70–99)

## 2021-05-22 LAB — C-REACTIVE PROTEIN: CRP: 1.5 mg/dL — ABNORMAL HIGH (ref <1.0)

## 2021-05-22 LAB — RENAL FUNCTION PANEL
Albumin: 3.1 g/dL — ABNORMAL LOW (ref 3.5–5.0)
Anion gap: 17 — ABNORMAL HIGH (ref 5–15)
BUN: 32 mg/dL — ABNORMAL HIGH (ref 8–23)
CO2: 26 mmol/L (ref 22–32)
Calcium: 8.9 mg/dL (ref 8.9–10.3)
Chloride: 85 mmol/L — ABNORMAL LOW (ref 98–111)
Creatinine, Ser: 10.91 mg/dL — ABNORMAL HIGH (ref 0.61–1.24)
GFR, Estimated: 4 mL/min — ABNORMAL LOW (ref >60)
Glucose, Bld: 74 mg/dL (ref 70–99)
Phosphorus: 5.8 mg/dL — ABNORMAL HIGH (ref 2.5–4.6)
Potassium: 4.9 mmol/L (ref 3.5–5.1)
Sodium: 128 mmol/L — ABNORMAL LOW (ref 135–145)

## 2021-05-22 LAB — CBC
HCT: 40.4 % (ref 39.0–52.0)
Hemoglobin: 12.5 g/dL — ABNORMAL LOW (ref 13.0–17.0)
MCH: 30.4 pg (ref 26.0–34.0)
MCHC: 30.9 g/dL (ref 30.0–36.0)
MCV: 98.3 fL (ref 80.0–100.0)
Platelets: 95 10*3/uL — ABNORMAL LOW (ref 150–400)
RBC: 4.11 MIL/uL — ABNORMAL LOW (ref 4.22–5.81)
RDW: 16.4 % — ABNORMAL HIGH (ref 11.5–15.5)
WBC: 8 10*3/uL (ref 4.0–10.5)
nRBC: 0 % (ref 0.0–0.2)

## 2021-05-22 LAB — D-DIMER, QUANTITATIVE: D-Dimer, Quant: 3.96 ug/mL-FEU — ABNORMAL HIGH (ref 0.00–0.50)

## 2021-05-22 LAB — INSULIN, RANDOM: Insulin: 63.8 u[IU]/mL — ABNORMAL HIGH (ref 2.6–24.9)

## 2021-05-22 LAB — C-PEPTIDE: C-Peptide: 13.5 ng/mL — ABNORMAL HIGH (ref 1.1–4.4)

## 2021-05-22 LAB — MRSA NEXT GEN BY PCR, NASAL: MRSA by PCR Next Gen: DETECTED — AB

## 2021-05-22 MED ORDER — CHLORHEXIDINE GLUCONATE CLOTH 2 % EX PADS
6.0000 | MEDICATED_PAD | Freq: Every day | CUTANEOUS | Status: DC
  Administered 2021-05-23 – 2021-05-26 (×3): 6 via TOPICAL

## 2021-05-22 MED ORDER — DEXTROSE 50 % IV SOLN
INTRAVENOUS | Status: AC
  Filled 2021-05-22: qty 50

## 2021-05-22 NOTE — Procedures (Signed)
Pt arrived to [facility] + room on 5th floor, was alert however disoriented.  He progressively became more disoriented as the evening became later.  (Sundowners?)  He began attempting to get out of his bed and calling out to his wife and daughter.  Hemodialysis RN attempted to soothe him and re-orient him to the situation and place.  He continued to move his arm throughout entire treatment and had his needle tapes loosened as he was explaining he was leaving to go home.  RN found less than 100 mls of blood leaking from needles and on to linens. All blood was returned and needles removed.  As blood was noted to be on his sheets and gown, all linens were changed.  Unable to perform blood sugars due to not being issued a badge.  No one in the hallways were available.  RN returned to Hemodialysis unit upon pt transfer to his bedside in order to contact RN to give report on Pt situation.

## 2021-05-22 NOTE — Progress Notes (Addendum)
PROGRESS NOTE                                                                                                                                                                                                             Patient Demographics:    Travis [name], is a 80 y.o. male, DOB - 06-03-1941, [id number]:[id number]  Outpatient Primary MD for the patient is [name], [place], MD   Admit date - 05/20/2021   LOS - 2  Chief Complaint  Patient presents with   Hypoglycemia   Fall       Brief Narrative: Patient is a 80 y.o. 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  Patient was found to be COVID-19 positive-he was subsequently admitted to the hospitalist service.  COVID-19 vaccinated status: Vaccinated  Significant Events: 9/7>> Admit to [facility] for fever/AMS/hypoglycemia/COVID-19 infection  Significant studies: 9/7>> CT head: No acute intracranial abnormalities 9/7>> CT C-spine: No fracture/subluxation 9/7>> CT chest: No pneumonia 9/8>> CT abdomen/pelvis: Cholelithiasis, moderate prostate enlargement. 9/8>> C-peptide/random insulin levels: Elevated 9/8>> Sulfonylureas levels/proinsulin/insulin ratio: Pending  COVID-19 medications: Remdesivir: 9/8>>  Antibiotics: Vancomycin: 9/7 x 1 Rocephin: 9/7 x 1  Microbiology data: 9/7>> COVID PCR: Positive (CT value 28.7) 9/7 >>blood culture: No growth  Procedures: None  Consults: Nephrology  DVT prophylaxis: heparin injection 5,000 Units Start: 05/20/21 2200    Subjective:   He feels much better today.  He is completely awake and alert-sitting at chair at bedside and talking to me.   Assessment  & Plan :   Sepsis: Sepsis physiology has resolved-etiology felt to be COVID-19  infection.  Continue Remdesivir.   COVID-19 infection: Not hypoxic-no PNA on imaging-continue Remdesivir x3 days total.    Recent Labs    05/21/21 0427 05/22/21 0151  DDIMER 2.31* 3.96*  FERRITIN 1,446*  --   LDH 193*  --   CRP 0.9 1.5*    Acute metabolic encephalopathy: Due to hypoglycemia-encephalopathy has resolved-patient completely awake and alert this morning.    Hypoglycemia: Unclear etiology-improving-decrease D10 to 40 cc/hour-encourage oral intake-if CBGs continue to be stable-suspect can decrease further.  C-peptide/random insulin levels were elevated-awaiting sulfonylurea level.  Spouse apparently is a diabetic-not clear  exactly what therapy she is on-but from my conversation with the patient's daughter yesterday-very unlikely that patient could have inadvertently taken his wife's medication.  Note-informed by RN on 9/8 evening (around 4:30 pm)-that even with CBG of 39 from fingerstick-patient was not symptomatic-CBG was checked from your lobe-was 95.  Subsequently serum blood glucose was checked it was 63.  Looks like patient may have falsely low CBG readings from fingerstick due to circulation issues as well.   Recent Labs    05/22/21 0732 05/22/21 0756 05/22/21 1129  GLUCAP 96 81 82      ESRD: Have consulted nephrology for HD  HLD: Statin  History of pulmonary fibrosis-on chronic hypoxic respiratory failure-on 2 L of oxygen at home: Seems stable  GI prophylaxis: PPI ABG:    Component Value Date/Time   PHART 7.499 (H) 04/19/2015 0534   PCO2ART 28.3 (L) 04/19/2015 0534   PO2ART 41.0 (L) 04/19/2015 0534   HCO3 32.3 (H) 05/20/2021 1341   TCO2 34 (H) 05/20/2021 1341   O2SAT 50.0 05/20/2021 1341    Vent Settings: N/A   Condition - Stable  Family Communication  :  Spouse-[name]- [phone]-called-unable to leave voicemail.  Code Status :  Full Code  Diet :  Diet Order             Diet Heart Room service appropriate? Yes; Fluid consistency: Thin   Diet effective now                    Disposition Plan  :   Status is: Inpatient  Remains inpatient appropriate because:Inpatient level of care appropriate due to severity of illness  Dispo: The patient is from: Home              Anticipated d/c is to: Home              Patient currently is not medically stable to d/c.   Difficult to place patient No    Barriers to discharge: Resolving sepsis physiology-resolving hypoglycemia still on D10 infusion.  Antimicorbials  :    Anti-infectives (From admission, onward)    Start     Dose/Rate Route Frequency Ordered Stop   05/22/21 1200  vancomycin (VANCOREADY) IVPB 750 mg/150 mL  Status:  Discontinued        750 mg 150 mL/hr over 60 Minutes Intravenous Every M-W-F (Hemodialysis) 05/21/21 0737 05/21/21 1134   05/22/21 1000  remdesivir 100 mg in sodium chloride 0.9 % 100 mL IVPB       See Hyperspace for full Linked Orders Report.   100 mg 200 mL/hr over 30 Minutes Intravenous Daily 05/21/21 1134 05/26/21 0959   05/21/21 1400  remdesivir 200 mg in sodium chloride 0.9% 250 mL IVPB       See Hyperspace for full Linked Orders Report.   200 mg 580 mL/hr over 30 Minutes Intravenous Once 05/21/21 1134 05/21/21 1457   05/20/21 2059  vancomycin variable dose per unstable renal function (pharmacist dosing)  Status:  Discontinued         Does not apply See admin instructions 05/20/21 2059 05/21/21 0737   05/20/21 1545  vancomycin (VANCOREADY) IVPB 1500 mg/300 mL        1,500 mg 150 mL/hr over 120 Minutes Intravenous  Once 05/20/21 1546 05/20/21 1925   05/20/21 1245  cefTRIAXone (ROCEPHIN) 2 g in sodium chloride 0.9 % 100 mL IVPB  Status:  Discontinued        2 g 200 mL/hr over 30 Minutes  Intravenous Every 24 hours 05/20/21 1232 05/21/21 1122   05/20/21 0000  metroNIDAZOLE (FLAGYL) 500 MG/100ML        500 mg Intravenous  Once 05/20/21 1521 05/20/21 2359       Inpatient Medications  Scheduled Meds:  aspirin EC  81 mg Oral Daily    atorvastatin  40 mg Oral q1800   [START ON 05/23/2021] Chlorhexidine Gluconate Cloth  6 each Topical Q0600   cinacalcet  90 mg Oral Q M,W,F-HD   dorzolamide-timolol  1 drop Both Eyes BID   gabapentin  300 mg Oral QPM   heparin  5,000 Units Subcutaneous Q12H   insulin aspart  0-6 Units Subcutaneous TID WC   latanoprost  1 drop Both Eyes QHS   pantoprazole  40 mg Oral Daily   sevelamer carbonate  1,600 mg Oral TID WC   vitamin B-12  500 mcg Oral Daily   Continuous Infusions:  dextrose 40 mL/hr at 05/22/21 1132   remdesivir 100 mg in NS 100 mL 100 mg (05/22/21 1131)   PRN Meds:.acetaminophen **OR** acetaminophen, dextrose, hydrALAZINE, ondansetron **OR** ondansetron (ZOFRAN) IV   Time Spent in minutes  25    See all Orders from today for further details   Oren Binet M.D on 05/22/2021 at 1:21 PM  To page go to [email or web address] - use universal password  [facility] -  Office  [phone]    Objective:   Vitals:   05/22/21 0347 05/22/21 0400 05/22/21 0733 05/22/21 1132  BP:  (!) 163/63 (!) 185/94 (!) 179/82  Pulse:  67 68 65  Resp:  '17 20 18  '$ Temp: 99.9 F (37.7 C)  99.2 F (37.3 C) 98.4 F (36.9 C)  TempSrc: Oral  Oral Oral  SpO2:  100% 100% 100%  Weight:      Height:        Wt Readings from Last 3 Encounters:  05/20/21 75 kg  03/10/21 75 kg  12/09/20 74 kg     Intake/Output Summary (Last 24 hours) at 05/22/2021 1321 Last data filed at 05/22/2021 0900 Gross per 24 hour  Intake 3555.41 ml  Output --  Net 3555.41 ml     Physical Exam Gen Exam:Alert awake-not in any distress HEENT:atraumatic, normocephalic Chest: B/L clear to auscultation anteriorly CVS:S1S2 regular Abdomen:soft non tender, non distended Extremities:no edema Neurology: Non focal Skin: no rash    Data Review:    CBC Recent Labs  Lab 05/20/21 1310 05/20/21 1341 05/21/21 0427 05/22/21 0151  WBC 7.5  --  5.3 8.0  HGB 13.2 13.9 12.0* 12.5*  HCT 44.0 41.0 40.2 40.4  PLT 147*   --  104* 95*  MCV 102.1*  --  102.3* 98.3  MCH 30.6  --  30.5 30.4  MCHC 30.0  --  29.9* 30.9  RDW 16.6*  --  16.6* 16.4*  LYMPHSABS 0.6*  --   --   --   MONOABS 1.3*  --   --   --   EOSABS 0.1  --   --   --   BASOSABS 0.0  --   --   --      Chemistries  Recent Labs  Lab 05/20/21 1310 05/20/21 1341 05/21/21 0427 05/21/21 1537 05/21/21 1744 05/22/21 0151  NA 138 136 135  --   --  128*  K 4.5 6.6* 4.3  --   --  4.9  CL 92*  --  88*  --   --  85*  CO2 30  --  30  --   --  26  GLUCOSE 162*  --  99 111* 63* 74  BUN 22  --  27*  --   --  32*  CREATININE 8.37*  --  9.59*  --   --  10.91*  CALCIUM 9.4  --  9.0  --   --  8.9  MG  --   --  2.2  --   --   --   AST 17  --   --   --   --   --   ALT 10  --   --   --   --   --   ALKPHOS 81  --   --   --   --   --   BILITOT 0.6  --   --   --   --   --     ------------------------------------------------------------------------------------------------------------------ No results for input(s): CHOL, HDL, LDLCALC, TRIG, CHOLHDL, LDLDIRECT in the last 72 hours.  Lab Results  Component Value Date   HGBA1C 4.7 (L) 05/21/2021   ------------------------------------------------------------------------------------------------------------------ No results for input(s): TSH, T4TOTAL, T3FREE, THYROIDAB in the last 72 hours.  Invalid input(s): FREET3 ------------------------------------------------------------------------------------------------------------------ Recent Labs    05/21/21 0427  FERRITIN 1,446*     Coagulation profile No results for input(s): INR, PROTIME in the last 168 hours.  Recent Labs    05/21/21 0427 05/22/21 0151  DDIMER 2.31* 3.96*     Cardiac Enzymes No results for input(s): CKMB, TROPONINI, MYOGLOBIN in the last 168 hours.  Invalid input(s): CK ------------------------------------------------------------------------------------------------------------------    Component Value Date/Time   BNP 502.4  (H) 05/21/2021 0427    Micro Results Recent Results (from the past 240 hour(s))  Blood Culture (routine x 2)     Status: None (Preliminary result)   Collection Time: 05/20/21  1:10 PM   Specimen: BLOOD LEFT ARM  Result Value Ref Range Status   Specimen Description BLOOD LEFT ARM  Final   Special Requests   Final    BOTTLES DRAWN AEROBIC AND ANAEROBIC Blood Culture results may not be optimal due to an inadequate volume of blood received in culture bottles   Culture   Final    NO GROWTH 2 DAYS Performed at [facility], [place] [address], [place], [place] [address]    Report Status PENDING  Incomplete  Resp Panel by RT-PCR (Flu A&B, Covid) Nasopharyngeal Swab     Status: Abnormal   Collection Time: 05/20/21  2:40 PM   Specimen: Nasopharyngeal Swab; Nasopharyngeal(NP) swabs in vial transport medium  Result Value Ref Range Status   SARS Coronavirus 2 by RT PCR POSITIVE (A) NEGATIVE Final    Comment: RESULT CALLED TO, READ BACK BY AND VERIFIED WITH: KASEY CHAPMAN,RN 05/20/2021 AT 1658 BY PAW. (NOTE) SARS-CoV-2 target nucleic acids are DETECTED.  The SARS-CoV-2 RNA is generally detectable in upper respiratory specimens during the acute phase of infection. Positive results are indicative of the presence of the identified virus, but do not rule out bacterial infection or co-infection with other pathogens not detected by the test. Clinical correlation with patient history and other diagnostic information is necessary to determine patient infection status. The expected result is Negative.  Fact Sheet for Patients: EntrepreneurPulse.com.au  Fact Sheet for Healthcare Providers: IncredibleEmployment.be  This test is not yet approved or cleared by the [place] FDA and  has been authorized for detection and/or diagnosis of SARS-CoV-2 by FDA under an Emergency Use Authorization (EUA).  This EUA will remain  in effect (meaning this t est can be  used) for the duration of  the COVID-19 declaration under Section 564(b)(1) of the Act, 21 U.S.C. section 360bbb-3(b)(1), unless the authorization is terminated or revoked sooner.     Influenza A by PCR NEGATIVE NEGATIVE Final   Influenza B by PCR NEGATIVE NEGATIVE Final    Comment: (NOTE) The Xpert Xpress SARS-CoV-2/FLU/RSV plus assay is intended as an aid in the diagnosis of influenza from Nasopharyngeal swab specimens and should not be used as a sole basis for treatment. Nasal washings and aspirates are unacceptable for Xpert Xpress SARS-CoV-2/FLU/RSV testing.  Fact Sheet for Patients: EntrepreneurPulse.com.au  Fact Sheet for Healthcare Providers: IncredibleEmployment.be  This test is not yet approved or cleared by the [place] FDA and has been authorized for detection and/or diagnosis of SARS-CoV-2 by FDA under an Emergency Use Authorization (EUA). This EUA will remain in effect (meaning this test can be used) for the duration of the COVID-19 declaration under Section 564(b)(1) of the Act, 21 U.S.C. section 360bbb-3(b)(1), unless the authorization is terminated or revoked.  Performed at [facility], [place] [address], [place], [place] [address]   MRSA Next Gen by PCR, Nasal     Status: Abnormal   Collection Time: 05/22/21  3:45 AM   Specimen: Nasal Mucosa; Nasal Swab  Result Value Ref Range Status   MRSA by PCR Next Gen DETECTED (A) NOT DETECTED Final    Comment: RESULT CALLED TO, READ BACK BY AND VERIFIED WITH: RN BRANDY HARRIS 05/22/21'@06'$ :35 BY TW (NOTE) The GeneXpert MRSA Assay (FDA approved for NASAL specimens only), is one component of a comprehensive MRSA colonization surveillance program. It is not intended to diagnose MRSA infection nor to guide or monitor treatment for MRSA infections. Test performance is not FDA approved in patients less than 58 years old. Performed at [facility], [place] [address]  [place], [place] [address]     Radiology Reports CT HEAD WO CONTRAST (5MM)  Result Date: 05/20/2021 CLINICAL DATA:  Status post fall. In stage renal disease on hemodialysis. History of Sjogren syndrome. EXAM: CT HEAD WITHOUT CONTRAST CT CERVICAL SPINE WITHOUT CONTRAST TECHNIQUE: Multidetector CT imaging of the head and cervical spine was performed following the standard protocol without intravenous contrast. Multiplanar CT image reconstructions of the cervical spine were also generated. COMPARISON:  CT head 04/22/2015.  Chest CT 11/06/2015. FINDINGS: CT HEAD FINDINGS Brain: There is no evidence of acute intracranial hemorrhage, mass lesion, brain edema or extra-axial fluid collection. Mildly progressive atrophy with prominence of the ventricles and subarachnoid spaces there is confluent low-density in the periventricular white matter which has progressed, likely due to progressive chronic small vessel ischemic changes. There is no CT evidence of acute cortical infarction. Vascular: Prominent intracranial vascular calcifications. No hyperdense vessel identified. Skull: Negative for fracture or focal lesion. Sinuses/Orbits: The visualized paranasal sinuses and mastoid air cells are clear. No orbital abnormalities are seen. Other: None. CT CERVICAL SPINE FINDINGS Alignment: Normal Skull base and vertebrae: No evidence of acute fracture or traumatic subluxation. There are scattered tiny lucent lesions throughout the vertebral bodies which may relate to chronic renal failure/hyperparathyroidism. Appearance is similar to prior chest CT, making a neoplastic process unlikely. Soft tissues and spinal canal: No prevertebral fluid or swelling. No visible canal hematoma. Disc levels: Multilevel cervical spondylosis with loss of disc height, uncinate spurring and facet hypertrophy. Resulting mild foraminal narrowing at multiple levels. No large disc herniation identified. Upper chest: Unremarkable. Other: Mild bilateral carotid  atherosclerosis. IMPRESSION: 1. No acute intracranial or calvarial findings. 2. Mildly progressive atrophy and chronic small vessel ischemic changes. 3. No evidence of acute cervical spine fracture, traumatic subluxation or static signs of instability. 4. Mild multilevel spondylosis. Electronically Signed   By: Richardean Sale M.D.   On: 05/20/2021 13:36   CT Cervical Spine Wo Contrast  Result Date: 05/20/2021 CLINICAL DATA:  Status post fall. In stage renal disease on hemodialysis. History of Sjogren syndrome. EXAM: CT HEAD WITHOUT CONTRAST CT CERVICAL SPINE WITHOUT CONTRAST TECHNIQUE: Multidetector CT imaging of the head and cervical spine was performed following the standard protocol without intravenous contrast. Multiplanar CT image reconstructions of the cervical spine were also generated. COMPARISON:  CT head 04/22/2015.  Chest CT 11/06/2015. FINDINGS: CT HEAD FINDINGS Brain: There is no evidence of acute intracranial hemorrhage, mass lesion, brain edema or extra-axial fluid collection. Mildly progressive atrophy with prominence of the ventricles and subarachnoid spaces there is confluent low-density in the periventricular white matter which has progressed, likely due to progressive chronic small vessel ischemic changes. There is no CT evidence of acute cortical infarction. Vascular: Prominent intracranial vascular calcifications. No hyperdense vessel identified. Skull: Negative for fracture or focal lesion. Sinuses/Orbits: The visualized paranasal sinuses and mastoid air cells are clear. No orbital abnormalities are seen. Other: None. CT CERVICAL SPINE FINDINGS Alignment: Normal Skull base and vertebrae: No evidence of acute fracture or traumatic subluxation. There are scattered tiny lucent lesions throughout the vertebral bodies which may relate to chronic renal failure/hyperparathyroidism. Appearance is similar to prior chest CT, making a neoplastic process unlikely. Soft tissues and spinal canal: No  prevertebral fluid or swelling. No visible canal hematoma. Disc levels: Multilevel cervical spondylosis with loss of disc height, uncinate spurring and facet hypertrophy. Resulting mild foraminal narrowing at multiple levels. No large disc herniation identified. Upper chest: Unremarkable. Other: Mild bilateral carotid atherosclerosis. IMPRESSION: 1. No acute intracranial or calvarial findings. 2. Mildly progressive atrophy and chronic small vessel ischemic changes. 3. No evidence of acute cervical spine fracture, traumatic subluxation or static signs of instability. 4. Mild multilevel spondylosis. Electronically Signed   By: Richardean Sale M.D.   On: 05/20/2021 13:36   DG Chest Port 1 View  Result Date: 05/20/2021 CLINICAL DATA:  Questionable sepsis EXAM: PORTABLE CHEST 1 VIEW COMPARISON:  Chest radiograph 10/16/2020 FINDINGS: The heart is at the upper limits of normal for size, increased compared to the prior chest radiograph. The mediastinal contours are within normal limits. There is calcified atherosclerotic plaque of the aortic arch. A right subclavian vascular stent is again noted. There is no focal consolidation or pulmonary edema. There is no pleural effusion or pneumothorax. There is no acute osseous abnormality. IMPRESSION: Increased size of the cardiac silhouette compared to the study from 10/16/2020. Given the patient has had pericardial effusions in the past, consider correlation with ECHO. Electronically Signed   By: Valetta Mole M.D.   On: 05/20/2021 13:39   CT CHEST ABDOMEN PELVIS WO CONTRAST  Result Date: 05/20/2021 CLINICAL DATA:  Fever. EXAM: CT CHEST, ABDOMEN AND PELVIS WITHOUT CONTRAST TECHNIQUE: Multidetector CT imaging of the chest, abdomen and pelvis was performed following the standard protocol without IV contrast. COMPARISON:  October 17, 2015. FINDINGS: CT CHEST FINDINGS Cardiovascular: Atherosclerosis of thoracic aorta is noted without aneurysm formation. Normal cardiac size. No  pericardial effusion. Coronary artery calcifications are noted. Mediastinum/Nodes: No enlarged mediastinal, hilar, or axillary lymph nodes. Thyroid gland, trachea, and esophagus demonstrate no significant findings. Lungs/Pleura: No pneumothorax or pleural  effusion is noted. Minimal bibasilar subsegmental atelectasis or scarring is noted. Musculoskeletal: No chest wall mass or suspicious bone lesions identified. CT ABDOMEN PELVIS FINDINGS Hepatobiliary: Cholelithiasis is noted. No biliary dilatation is noted. The liver is unremarkable. Pancreas: Unremarkable. No pancreatic ductal dilatation or surrounding inflammatory changes. Spleen: Normal in size without focal abnormality. Adrenals/Urinary Tract: Adrenal glands appear normal. Multiple small bilateral renal cysts are noted. No hydronephrosis or renal obstruction is noted. No renal or ureteral calculi are noted. Urinary bladder is unremarkable. Stomach/Bowel: The stomach appears normal. There is no evidence of bowel obstruction or inflammation. Vascular/Lymphatic: Aortic atherosclerosis. No enlarged abdominal or pelvic lymph nodes. IVC filter is in infrarenal position. Reproductive: Moderate prostatic enlargement is noted. Other: No abdominal wall hernia or abnormality. No abdominopelvic ascites. Musculoskeletal: No acute or significant osseous findings. IMPRESSION: Cholelithiasis. Coronary artery calcifications are noted suggesting coronary artery disease. Multiple bilateral renal cysts are noted. Moderate prostatic enlargement. No acute abnormality seen in the chest, abdomen or pelvis. Aortic Atherosclerosis (ICD10-I70.0). Electronically Signed   By: Marijo Conception M.D.   On: 05/20/2021 16:02

## 2021-05-22 NOTE — Progress Notes (Signed)
Pt L AC IV access continuously alarming occluded (D10 running at 75cc/hr this morning).  This RN unsuccessful with two new IV start attempts.  Due to isolation status, alarming pump stopped, IV team paged for new start.  Order to run D10 at 40cc/hr started late.  CBGs assessed at that time and stable at 29. Still awaiting dialysis.

## 2021-05-22 NOTE — Progress Notes (Signed)
[facility] Progress Note   Subjective:    Patient seen and examined at bedside. Wife currently at bedside. Resting comfortably. Plan for HD today.  Objective Vitals:   05/22/21 0347 05/22/21 0400 05/22/21 0733 05/22/21 1132  BP:  (!) 163/63 (!) 185/94 (!) 179/82  Pulse:  67 68 65  Resp:  '17 20 18  '$ Temp: 99.9 F (37.7 C)  99.2 F (37.3 C) 98.4 F (36.9 C)  TempSrc: Oral  Oral Oral  SpO2:  100% 100% 100%  Weight:      Height:       Physical Exam General:Ill-appearing; (+) COVID; NAD Heart:S1 and S2; No murmurs, gallops, or rubs Lungs: Clear anteriorly; No rales, wheezing, or rhonchi Abdomen:Soft and non-tender Extremities:No edema BLLE Dialysis Access: R AVG   Filed Weights   05/20/21 1256  Weight: 75 kg    Intake/Output Summary (Last 24 hours) at 05/22/2021 1529 Last data filed at 05/22/2021 1421 Gross per 24 hour  Intake 1781.84 ml  Output --  Net 1781.84 ml    Additional Objective Labs: Basic Metabolic Panel: Recent Labs  Lab 05/20/21 1310 05/20/21 1341 05/21/21 0427 05/21/21 1537 05/21/21 1744 05/22/21 0151  NA 138 136 135  --   --  128*  K 4.5 6.6* 4.3  --   --  4.9  CL 92*  --  88*  --   --  85*  CO2 30  --  30  --   --  26  GLUCOSE 162*  --  99 111* 63* 74  BUN 22  --  27*  --   --  32*  CREATININE 8.37*  --  9.59*  --   --  10.91*  CALCIUM 9.4  --  9.0  --   --  8.9  PHOS  --   --  5.2*  --   --  5.8*   Liver Function Tests: Recent Labs  Lab 05/20/21 1310 05/22/21 0151  AST 17  --   ALT 10  --   ALKPHOS 81  --   BILITOT 0.6  --   PROT 8.3*  --   ALBUMIN 4.1 3.1*   No results for input(s): LIPASE, AMYLASE in the last 168 hours. CBC: Recent Labs  Lab 05/20/21 1310 05/20/21 1341 05/21/21 0427 05/22/21 0151  WBC 7.5  --  5.3 8.0  NEUTROABS 5.5  --   --   --   HGB 13.2 13.9 12.0* 12.5*  HCT 44.0 41.0 40.2 40.4  MCV 102.1*  --  102.3* 98.3  PLT 147*  --  104* 95*   Blood Culture    Component Value Date/Time    SDES BLOOD LEFT ARM 05/20/2021 1310   SPECREQUEST  05/20/2021 1310    BOTTLES DRAWN AEROBIC AND ANAEROBIC Blood Culture results may not be optimal due to an inadequate volume of blood received in culture bottles   CULT  05/20/2021 1310    NO GROWTH 2 DAYS Performed at [facility] [address], [place], [place] [address]    REPTSTATUS PENDING 05/20/2021 1310    Cardiac Enzymes: No results for input(s): CKTOTAL, CKMB, CKMBINDEX, TROPONINI in the last 168 hours. CBG: Recent Labs  Lab 05/21/21 2316 05/22/21 0345 05/22/21 0732 05/22/21 0756 05/22/21 1129  GLUCAP 175* 136* 96 81 82   Iron Studies:  Recent Labs    05/21/21 0427  FERRITIN 1,446*   Lab Results  Component Value Date   INR 1.42 07/28/2015   INR 1.20  05/14/2015   INR 1.15 04/19/2015   Studies/Results: No results found.  Medications:  dextrose 40 mL/hr at 05/22/21 1132   remdesivir 100 mg in NS 100 mL 100 mg (05/22/21 1131)    aspirin EC  81 mg Oral Daily   atorvastatin  40 mg Oral q1800   [START ON 05/23/2021] Chlorhexidine Gluconate Cloth  6 each Topical Q0600   cinacalcet  90 mg Oral Q M,W,F-HD   dorzolamide-timolol  1 drop Both Eyes BID   gabapentin  300 mg Oral QPM   heparin  5,000 Units Subcutaneous Q12H   insulin aspart  0-6 Units Subcutaneous TID WC   latanoprost  1 drop Both Eyes QHS   pantoprazole  40 mg Oral Daily   sevelamer carbonate  1,600 mg Oral TID WC   vitamin B-12  500 mcg Oral Daily    Dialysis Orders: [facility] MWF  3.5h  400/1.5   74.5kg  2/2 bath  RUA AVG  Heparin none  - sensipar 90 tiw   Assessment/Plan: Hypoglycemia - per primary team 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 On IV rocephin, Vanc and Remdesivir.  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 Had > 1/2 of HD yesterday. Labs/ vol ok. Plan for HD today.  BP/volume -   BP's normal to high, no vol excess/ edema on exam. CXR no edema.  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 Fe/ESA not indicated MBD ckd - cont sensipar, binders   Tobie Poet, NP [facility] 05/22/2021,3:29 PM  LOS: 2 days

## 2021-05-22 NOTE — Progress Notes (Signed)
Glucose and VS have stabilized throughout the shift. Patient displays no s/s of distress.

## 2021-05-22 NOTE — Evaluation (Signed)
Occupational Therapy Evaluation Patient Details Name: Travis [name] MRN: [id number] DOB: 1940-12-24 Today's Date: 05/22/2021    History of Present Illness 80 y.o. male presents to [facility] ED on 05/20/2021 from dialysis center with hypoglycemia, fatigue, and fever. Pt found to be COVID-19+. PMH includes ESRD on HD, pulmonary fibrosis with chronic hypoxic respiratory failure on 2 L of oxygen at home, Sjogren's syndrome, peripheral neuropathy.   Clinical Impression   PTA, pt lives with spouse and reports Modified Independence with ADLs and mobility using RW vs Rollator. Pt shares household IADLs with spouse. Pt presents now below functional baseline with deficits noted below. Pt overall Min A for bed mobility, up to Mod A for basic transfers using RW with small steps noted. Pt overall Setup for UB ADLs and Mod A for LB ADLs. Pt does endorse that these activities feel a bit more difficult than usual. At this time, would recommend SNF for rehab but pt hopeful to progress to [facility] w/ 24/7. Will continue to follow and update recs as appropriate.   SpO2 100% on RA, HR 60-70s    Follow Up Recommendations  SNF;Supervision/Assistance - 24 hour (hopeful to progress to [facility] w/ 24/7)    Equipment Recommendations  None recommended by OT    Recommendations for Other Services       Precautions / Restrictions Precautions Precautions: Fall Restrictions Weight Bearing Restrictions: No      Mobility Bed Mobility Overal bed mobility: Needs Assistance Bed Mobility: Supine to Sit     Supine to sit: Min assist;HOB elevated     General bed mobility comments: Light MinA to lift trunk, increased time and effort    Transfers Overall transfer level: Needs assistance Equipment used: Rolling walker (2 wheeled) Transfers: Sit to/from [facility] Sit to Stand: Mod assist Stand pivot transfers: Min assist       General transfer comment: Mod A for power up from bedside, took 2 attempts - slow to  rise and physical assist needed to achieve upright posture and reach to RW. Min A for pivot to recliner with slow small steps. cues for sequencing and assist to advance RW    Balance Overall balance assessment: Needs assistance;History of Falls Sitting-balance support: Feet supported;No upper extremity supported Sitting balance-Leahy Scale: Fair     Standing balance support: Bilateral upper extremity supported;During functional activity Standing balance-Leahy Scale: Poor Standing balance comment: reliant on UE support + external assist in standing                           ADL either performed or assessed with clinical judgement   ADL Overall ADL's : Needs assistance/impaired Eating/Feeding: Set up;Sitting Eating/Feeding Details (indicate cue type and reason): to prepare coffee Grooming: Set up;Sitting   Upper Body Bathing: Set up;Sitting   Lower Body Bathing: Moderate assistance;Sit to/from stand   Upper Body Dressing : Set up;Sitting   Lower Body Dressing: Moderate assistance;Sit to/from stand Lower Body Dressing Details (indicate cue type and reason): able to cross LEs to reach socks. will need increased assist in standing for clothing around waist due to balance deficits Toilet Transfer: Minimal assistance;Stand-pivot;RW Toilet Transfer Details (indicate cue type and reason): increased time, simulated to recliner Toileting- Clothing Manipulation and Hygiene: Moderate assistance;Sit to/from stand Toileting - Clothing Manipulation Details (indicate cue type and reason): Noted with smear on bed pad, Max A for posterior hygiene in standing, light balance assist with RW in standing  General ADL Comments: Noted with slower movements and pt does endorse minimal activities feel a bit more difficult than usual     Vision Baseline Vision/History: 0 No visual deficits Ability to See in Adequate Light: 0 Adequate Patient Visual Report: No change from baseline Vision  Assessment?: No apparent visual deficits     Perception     Praxis      Pertinent Vitals/Pain Pain Assessment: No/denies pain     Hand Dominance Left   Extremity/Trunk Assessment Upper Extremity Assessment Upper Extremity Assessment: RUE deficits/detail RUE Deficits / Details: R forearm fistula - appeared swollen though pt denies this is abnormal for forearm   Lower Extremity Assessment Lower Extremity Assessment: Defer to PT evaluation   Cervical / Trunk Assessment Cervical / Trunk Assessment: Kyphotic   Communication Communication Communication: No difficulties   Cognition Arousal/Alertness: Awake/alert Behavior During Therapy: Flat affect Overall Cognitive Status: No family/caregiver present to determine baseline cognitive functioning                                 General Comments: A&Ox3, slower processing and motor planning. benefits from cues for safety, etc but follows all directions. pleasant and cooperative   General Comments  SpO2 100% on RA, HR 60-70s. Denies dizziness. systolic BP [id number] though taken on forearm so unsure of accuracy. Assisted pt in calling wife at end of session    Exercises     Shoulder Instructions      Home Living Family/patient expects to be discharged to:: Private residence Living Arrangements: Spouse/significant other Available Help at Discharge: Family;Available 24 hours/day Type of Home: House Home Access: Stairs to enter [employer] of Steps: 4 Entrance Stairs-Rails: Right;Left Home Layout: One level     Bathroom Shower/Tub: Walk-in shower         Home Equipment: Environmental consultant - 2 wheels;Walker - 4 wheels;Cane - single point;Shower seat;Grab bars - toilet          Prior Functioning/Environment Level of Independence: Needs assistance  Gait / Transfers Assistance Needed: reports use of RW or Rollator for mobility though occasionally uses cane. Denies any other falls aside from the one leading to this  admission ADL's / Homemaking Assistance Needed: Pt reports able to transfer in/out of shower, complete ADLs without assist though takes some time. Shares household IADLs with wife; wife drives pt to grocery store, dialysis, etc            OT Problem List: Decreased strength;Decreased activity tolerance;Impaired balance (sitting and/or standing);Decreased cognition;Decreased knowledge of use of DME or AE      OT Treatment/Interventions: Self-care/ADL training;Therapeutic exercise;Energy conservation;DME and/or AE instruction;Therapeutic activities;Patient/family education;Balance training    OT Goals(Current goals can be found in the care plan section) Acute Rehab OT Goals Patient Stated Goal: feel better, go home soon. see family OT Goal Formulation: With patient Time For Goal Achievement: 06/05/21 Potential to Achieve Goals: Good  OT Frequency: Min 2X/week   Barriers to D/C:            Co-evaluation              AM-PAC OT "6 Clicks" Daily Activity     Outcome Measure Help from another person eating meals?: A Little Help from another person taking care of personal grooming?: A Little Help from another person toileting, which includes using toliet, bedpan, or urinal?: A Lot Help from another person bathing (including washing, rinsing, drying)?: A Lot Help  from another person to put on and taking off regular upper body clothing?: A Little Help from another person to put on and taking off regular lower body clothing?: A Lot 6 Click Score: 15   End of Session Equipment Utilized During Treatment: Gait belt;Rolling walker Nurse Communication: Mobility status  Activity Tolerance: Patient tolerated treatment well Patient left: in chair;with call bell/phone within reach;with chair alarm set  OT Visit Diagnosis: Unsteadiness on feet (R26.81);Other abnormalities of gait and mobility (R26.89);Muscle weakness (generalized) (M62.81)                Time: [id number] OT Time  Calculation (min): 41 min Charges:  OT General Charges $OT Visit: 1 Visit OT Evaluation $OT Eval Moderate Complexity: 1 Mod OT Treatments $Self Care/Home Management : 8-22 mins $Therapeutic Activity: 8-22 mins  Malachy Chamber, OTR/L Acute Rehab Services Office: [phone]   Layla Maw 05/22/2021, 7:56 AM

## 2021-05-22 NOTE — Evaluation (Signed)
Physical Therapy Evaluation Patient Details Name: Travis [name] MRN: [id number] DOB: Apr 27, 1941 Today's Date: 05/22/2021   History of Present Illness  80 y.o. male presents to [facility] ED on 05/20/2021 from dialysis center with hypoglycemia, fatigue, and fever. Pt found to be COVID-19+. PMH includes ESRD on HD, pulmonary fibrosis with chronic hypoxic respiratory failure on 2 L of oxygen at home, Sjogren's syndrome, peripheral neuropathy.   Clinical Impression  Pt in bed upon arrival of PT, agreeable to evaluation at this time. Prior to admission the pt was mobilizing with use of upright 4-wheel walker, but also reports he was intermittently able to complete mobility without need for AD. The pt now presents with limitations in functional mobility, strength, power, activity tolerance, and stability due to above dx, and will continue to benefit from skilled PT to address these deficits. The pt was able to complete multiple sit-stand transfers, but requires minA and significantly increased time to power up as well as max cues for hip and knee extension. The pt will continue to benefit from skilled PT acutely to progress independence with transfers and mobility tolerance, would require SNF rehab at this time as pt requires more assist than wife is able to provide. Will continue to work towards home with [place].      Follow Up Recommendations SNF;Supervision for mobility/OOB (pt hopeful to be able to progress to d/c home, currently requiring more assist than wife is able to provide and would need SNF)    Equipment Recommendations  None recommended by PT    Recommendations for Other Services       Precautions / Restrictions Precautions Precautions: Fall Restrictions Weight Bearing Restrictions: No      Mobility  Bed Mobility Overal bed mobility: Needs Assistance Bed Mobility: Supine to Sit     Supine to sit: Min guard     General bed mobility comments: significantly increased time and effort,  but pt able to complete with HOB elevated and rails    Transfers Overall transfer level: Needs assistance Equipment used: Rolling walker (2 wheeled) Transfers: Sit to/from [facility] Sit to Stand: Min assist Stand pivot transfers: Min assist       General transfer comment: minA with significantly increased time and max cues to extend at knees and hips. pt needing repeated cues for posture and positioning in RW with static stance  Ambulation/Gait Ambulation/Gait assistance: Min assist Gait Distance (Feet): 30 Feet Assistive device: Rolling walker (2 wheeled) Gait Pattern/deviations: Step-to pattern;Decreased stride length;Shuffle;Trunk flexed Gait velocity: significantly decreased Gait velocity interpretation: <1.31 ft/sec, indicative of household ambulator General Gait Details: pt with significant trunk flexion, unable to maintain postural cues with mobiltiy. minA frequently to manage movement of RW for turning and navigation in the room     Balance Overall balance assessment: Needs assistance;History of Falls Sitting-balance support: Feet supported;No upper extremity supported Sitting balance-Leahy Scale: Fair Sitting balance - Comments: minG for safety, pt able to manage without BUE support   Standing balance support: Bilateral upper extremity supported;During functional activity Standing balance-Leahy Scale: Poor Standing balance comment: reliant on UE support + external assist in standing                             Pertinent Vitals/Pain Pain Assessment: No/denies pain    Home Living Family/patient expects to be discharged to:: Private residence Living Arrangements: Spouse/significant other Available Help at Discharge: Family;Available 24 hours/day Type of Home: House Home Access: Stairs to  enter Entrance Stairs-Rails: Right;Left Entrance Stairs-Number of Steps: 4 Home Layout: One level Home Equipment: [employer] - 2 wheels;Walker - 4  wheels;Cane - single point;Shower seat;Grab bars - toilet      Prior Function Level of Independence: Needs assistance   Gait / Transfers Assistance Needed: reports use of RW or Rollator for mobility though occasionally uses cane. Denies any other falls aside from the one leading to this admission  ADL's / Homemaking Assistance Needed: Pt reports able to transfer in/out of shower, complete ADLs without assist though takes some time. Shares household IADLs with wife; wife drives pt to grocery store, dialysis, etc        Hand Dominance   Dominant Hand: Left    Extremity/Trunk Assessment   Upper Extremity Assessment Upper Extremity Assessment: Defer to OT evaluation    Lower Extremity Assessment Lower Extremity Assessment: Generalized weakness    Cervical / Trunk Assessment Cervical / Trunk Assessment: Kyphotic  Communication   Communication: No difficulties  Cognition Arousal/Alertness: Awake/alert Behavior During Therapy: Flat affect Overall Cognitive Status: No family/caregiver present to determine baseline cognitive functioning (wife present but asleep through session)                                 General Comments: pt alert and oriented through session, did require increased processing time and repetition of some cues and information. For example, pt stating he uses upright rolling walker x4 through session while expressing dissatisfaction with regular RW.      General Comments General comments (skin integrity, edema, etc.): VSS on RA. pt given lunch tray at end of session. wife present but asleep    Exercises     Assessment/Plan    PT Assessment Patient needs continued PT services  PT Problem List Decreased range of motion;Decreased strength;Decreased activity tolerance;Decreased balance;Decreased mobility;Decreased safety awareness       PT Treatment Interventions DME instruction;Gait training;Stair training;Functional mobility  training;Therapeutic activities;Therapeutic exercise;Balance training;Patient/family education    PT Goals (Current goals can be found in the Care Plan section)  Acute Rehab PT Goals Patient Stated Goal: feel better, go home soon. see family PT Goal Formulation: With patient Time For Goal Achievement: 06/05/21 Potential to Achieve Goals: Good    Frequency Min 3X/week    AM-PAC PT "6 Clicks" Mobility  Outcome Measure Help needed turning from your back to your side while in a flat bed without using bedrails?: A Little Help needed moving from lying on your back to sitting on the side of a flat bed without using bedrails?: A Little Help needed moving to and from a bed to a chair (including a wheelchair)?: A Lot Help needed standing up from a chair using your arms (e.g., wheelchair or bedside chair)?: A Lot Help needed to walk in hospital room?: A Lot Help needed climbing 3-5 steps with a railing? : A Lot 6 Click Score: 14    End of Session Equipment Utilized During Treatment: Gait belt Activity Tolerance: Patient tolerated treatment well;Patient limited by fatigue Patient left: in chair;with call bell/phone within reach;with chair alarm set;with family/visitor present Nurse Communication: Mobility status PT Visit Diagnosis: Unsteadiness on feet (R26.81);Other abnormalities of gait and mobility (R26.89);Muscle weakness (generalized) (M62.81)    Time: [id number] PT Time Calculation (min) (ACUTE ONLY): 41 min   Charges:   PT Evaluation $PT Eval Low Complexity: 1 Low PT Treatments $Gait Training: 8-22 mins $Therapeutic Activity: 23-37 mins  [place], PT, DPT   Acute Rehabilitation Department Pager #: [phone]   Sandra Cockayne 05/22/2021, 2:11 PM

## 2021-05-23 ENCOUNTER — Inpatient Hospital Stay (HOSPITAL_COMMUNITY): Payer: No Typology Code available for payment source

## 2021-05-23 LAB — GLUCOSE, CAPILLARY
Glucose-Capillary: 115 mg/dL — ABNORMAL HIGH (ref 70–99)
Glucose-Capillary: 133 mg/dL — ABNORMAL HIGH (ref 70–99)
Glucose-Capillary: 68 mg/dL — ABNORMAL LOW (ref 70–99)
Glucose-Capillary: 83 mg/dL (ref 70–99)
Glucose-Capillary: 88 mg/dL (ref 70–99)
Glucose-Capillary: 92 mg/dL (ref 70–99)
Glucose-Capillary: 92 mg/dL (ref 70–99)

## 2021-05-23 LAB — RENAL FUNCTION PANEL
Albumin: 2.9 g/dL — ABNORMAL LOW (ref 3.5–5.0)
Anion gap: 12 (ref 5–15)
BUN: 20 mg/dL (ref 8–23)
CO2: 26 mmol/L (ref 22–32)
Calcium: 8.9 mg/dL (ref 8.9–10.3)
Chloride: 93 mmol/L — ABNORMAL LOW (ref 98–111)
Creatinine, Ser: 7.12 mg/dL — ABNORMAL HIGH (ref 0.61–1.24)
GFR, Estimated: 7 mL/min — ABNORMAL LOW (ref >60)
Glucose, Bld: 89 mg/dL (ref 70–99)
Phosphorus: 4.7 mg/dL — ABNORMAL HIGH (ref 2.5–4.6)
Potassium: 4.3 mmol/L (ref 3.5–5.1)
Sodium: 131 mmol/L — ABNORMAL LOW (ref 135–145)

## 2021-05-23 LAB — CBC
HCT: 36.4 % — ABNORMAL LOW (ref 39.0–52.0)
Hemoglobin: 11.5 g/dL — ABNORMAL LOW (ref 13.0–17.0)
MCH: 30.6 pg (ref 26.0–34.0)
MCHC: 31.6 g/dL (ref 30.0–36.0)
MCV: 96.8 fL (ref 80.0–100.0)
Platelets: 112 10*3/uL — ABNORMAL LOW (ref 150–400)
RBC: 3.76 MIL/uL — ABNORMAL LOW (ref 4.22–5.81)
RDW: 16.2 % — ABNORMAL HIGH (ref 11.5–15.5)
WBC: 6.5 10*3/uL (ref 4.0–10.5)
nRBC: 0 % (ref 0.0–0.2)

## 2021-05-23 MED ORDER — IOHEXOL 300 MG/ML  SOLN
75.0000 mL | Freq: Once | INTRAMUSCULAR | Status: AC | PRN
  Administered 2021-05-23: 75 mL via INTRAVENOUS

## 2021-05-23 MED ORDER — METHYLPREDNISOLONE SODIUM SUCC 40 MG IJ SOLR
40.0000 mg | Freq: Once | INTRAMUSCULAR | Status: AC
  Administered 2021-05-23: 40 mg via INTRAVENOUS
  Filled 2021-05-23: qty 1

## 2021-05-23 MED ORDER — PREDNISONE 20 MG PO TABS: 20.0000 mg | ORAL_TABLET | Freq: Two times a day (BID) | ORAL | Status: DC

## 2021-05-23 MED ORDER — PREDNISONE 20 MG PO TABS
20.0000 mg | ORAL_TABLET | Freq: Two times a day (BID) | ORAL | Status: DC
  Administered 2021-05-23: 20 mg via ORAL
  Filled 2021-05-23: qty 1

## 2021-05-23 NOTE — Plan of Care (Signed)
  Problem: Activity: Goal: Risk for activity intolerance will decrease Outcome: Not Progressing   

## 2021-05-23 NOTE — Progress Notes (Addendum)
Patient's CBG 68 via finger stick. Rechecked via ear lobe, 92.   Patient's CBG needs to be done using ear lobe since finger sticks are not accurate.

## 2021-05-23 NOTE — Progress Notes (Signed)
Patient continues to ask for his wife and daughter. Patient stated that, "we were keeping him prisoner". Informed patient that he was in the hospital. Patient stated that, "he was not in the hospital". Wanted to see a doctor and/or nurses for proof that he was in the hospital". Informed the patient that I was a Therapist, sports but he insisted on seeing more. Patient stated that, "if he was in the hospital and not being kept prisoner, to call his wife or daughter". Daughter was called and spoke to patient. Patient seemed to settle at this point. Will continue to monitor

## 2021-05-23 NOTE — Progress Notes (Signed)
PROGRESS NOTE                                                                                                                                                                                                             Patient Demographics:    Travis [name], is a 80 y.o. male, DOB - 03-27-1941, [id number]  Outpatient Primary MD for the patient is [name], [place], MD   Admit date - 05/20/2021   LOS - 3  Chief Complaint  Patient presents with   Hypoglycemia   Fall       Brief Narrative: Patient is a 80 y.o. 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  Patient was found to be COVID-19 positive-he was subsequently admitted to the hospitalist service.  COVID-19 vaccinated status: Vaccinated  Significant Events: 9/7>> Admit to [facility] for fever/AMS/hypoglycemia/COVID-19 infection  Significant studies: 9/7>> CT head: No acute intracranial abnormalities 9/7>> CT C-spine: No fracture/subluxation 9/7>> CT chest: No pneumonia 9/8>> CT abdomen/pelvis: Cholelithiasis, moderate prostate enlargement. 9/8>> C-peptide/random insulin levels: Elevated 9/8>> Sulfonylureas levels/proinsulin/insulin ratio: Pending  COVID-19 medications: Remdesivir: 9/8>>  Antibiotics: Vancomycin: 9/7 x 1 Rocephin: 9/7 x 1  Microbiology data: 9/7>> COVID PCR: Positive (CT value 28.7) 9/7 >>blood culture: No growth  Procedures: None  Consults: Nephrology  DVT prophylaxis: heparin injection 5,000 Units Start: 05/20/21 2200    Subjective:   Patient in bed, appears comfortable, denies any headache, no fever, no chest pain or pressure, no shortness of breath , no abdominal pain. No new focal weakness.    Assessment  & Plan :   Sepsis: Sepsis  physiology has resolved-etiology felt to be COVID-19 infection.  Continue Remdesivir.   COVID-19 infection: Not hypoxic-no PNA on imaging-continue Remdesivir x3 days total.    Recent Labs    05/21/21 0427 05/22/21 0151  DDIMER 2.31* 3.96*  FERRITIN 1,446*  --   LDH 193*  --   CRP 0.9 1.5*   Acute metabolic encephalopathy: Due to hypoglycemia-encephalopathy has resolved-patient completely awake and alert this morning.    Hypoglycemia: Unclear etiology-improving-decrease D10 to 40 cc/hour-encourage oral intake-if CBGs continue to be stable-suspect can decrease further.  C-peptide/random insulin levels were elevated-awaiting sulfonylurea level.  However he has developed persistent hypoglycemia which seems to be recurrent, have placed him on steroids, CT abdomen pelvis with IV Contrast if no clear source for hypoglycemia has found outpatient endocrine follow-up.    ESRD: Have consulted nephrology for HD  HLD: Statin  History of pulmonary fibrosis-on chronic hypoxic respiratory failure-on 2 L of oxygen at home: Seems stable    GI prophylaxis: PPI  Condition - Stable  Family Communication  :  Spouse-[name]- [phone]-called-unable to leave voicemail.  Code Status :  Full Code  Diet :  Diet Order             Diet Heart Room service appropriate? Yes; Fluid consistency: Thin  Diet effective now                    Disposition Plan  :   Status is: Inpatient  Remains inpatient appropriate because:Inpatient level of care appropriate due to severity of illness  Dispo: The patient is from: Home              Anticipated d/c is to: Home              Patient currently is not medically stable to d/c.   Difficult to place patient No    Barriers to discharge: Resolving sepsis physiology-resolving hypoglycemia still on D10 infusion.  Antimicorbials  :    Anti-infectives (From admission, onward)    Start     Dose/Rate Route Frequency Ordered Stop   05/22/21 1200   vancomycin (VANCOREADY) IVPB 750 mg/150 mL  Status:  Discontinued        750 mg 150 mL/hr over 60 Minutes Intravenous Every M-W-F (Hemodialysis) 05/21/21 0737 05/21/21 1134   05/22/21 1000  remdesivir 100 mg in sodium chloride 0.9 % 100 mL IVPB       See Hyperspace for full Linked Orders Report.   100 mg 200 mL/hr over 30 Minutes Intravenous Daily 05/21/21 1134 05/26/21 0959   05/21/21 1400  remdesivir 200 mg in sodium chloride 0.9% 250 mL IVPB       See Hyperspace for full Linked Orders Report.   200 mg 580 mL/hr over 30 Minutes Intravenous Once 05/21/21 1134 05/21/21 1457   05/20/21 2059  vancomycin variable dose per unstable renal function (pharmacist dosing)  Status:  Discontinued         Does not apply See admin instructions 05/20/21 2059 05/21/21 0737   05/20/21 1545  vancomycin (VANCOREADY) IVPB 1500 mg/300 mL        1,500 mg 150 mL/hr over 120 Minutes Intravenous  Once 05/20/21 1546 05/20/21 1925   05/20/21 1245  cefTRIAXone (ROCEPHIN) 2 g in sodium chloride 0.9 % 100 mL IVPB  Status:  Discontinued        2 g 200 mL/hr over 30 Minutes Intravenous Every 24 hours 05/20/21 1232 05/21/21 1122   05/20/21 0000  metroNIDAZOLE (FLAGYL) 500 MG/100ML        500 mg Intravenous  Once 05/20/21 1521 05/20/21 2359       Inpatient Medications  Scheduled Meds:  aspirin EC  81 mg Oral Daily   atorvastatin  40 mg Oral q1800   Chlorhexidine Gluconate Cloth  6 each Topical Q0600   cinacalcet  90 mg Oral Q M,W,F-HD   dorzolamide-timolol  1 drop Both Eyes BID   gabapentin  300 mg Oral QPM   heparin  5,000 Units Subcutaneous Q12H   latanoprost  1 drop Both Eyes QHS   methylPREDNISolone (SOLU-MEDROL) injection  40 mg Intravenous Once   pantoprazole  40 mg Oral Daily   predniSONE  20 mg Oral BID WC   sevelamer carbonate  1,600 mg Oral TID WC   vitamin B-12  500 mcg Oral Daily   Continuous Infusions:  dextrose 40 mL/hr at 05/23/21 1059   remdesivir 100 mg in NS 100 mL 100 mg (05/23/21 1148)    PRN Meds:.acetaminophen **OR** [DISCONTINUED] acetaminophen, dextrose, hydrALAZINE, [DISCONTINUED] ondansetron **OR** ondansetron (ZOFRAN) IV   Time Spent in minutes  25    See all Orders from today for further details   Lala Lund M.D on 05/23/2021 at 12:11 PM  To page go to [email or web address] - use universal password  [facility] -  Office  [phone]    Objective:   Vitals:   05/22/21 2049 05/23/21 0015 05/23/21 0307 05/23/21 0834  BP: (!) 189/85 (!) 167/68 (!) 166/70 (!) 160/68  Pulse: 72 72 63 63  Resp: '20 17 15 19  '$ Temp: 99.3 F (37.4 C) 98.8 F (37.1 C) 98.4 F (36.9 C) 98.8 F (37.1 C)  TempSrc: Oral Oral Oral Oral  SpO2: 100% 99% 95% 100%  Weight:      Height:        Wt Readings from Last 3 Encounters:  05/22/21 76.5 kg  03/10/21 75 kg  12/09/20 74 kg     Intake/Output Summary (Last 24 hours) at 05/23/2021 1211 Last data filed at 05/23/2021 0521 Gross per 24 hour  Intake 619.21 ml  Output 1725 ml  Net -1105.79 ml     Physical Exam  Awake Alert, No new F.N deficits, Normal affect [facility].AT,PERRAL Supple Neck,No JVD, No cervical lymphadenopathy appriciated.  Symmetrical Chest wall movement, Good air movement bilaterally, CTAB RRR,No Gallops, Rubs or new Murmurs, No Parasternal Heave +ve B.Sounds, Abd Soft, No tenderness, No organomegaly appriciated, No rebound - guarding or rigidity. No Cyanosis, Clubbing or edema, No new Rash or bruise    Data Review:    CBC Recent Labs  Lab 05/20/21 1310 05/20/21 1341 05/21/21 0427 05/22/21 0151 05/23/21 0114  WBC 7.5  --  5.3 8.0 6.5  HGB 13.2 13.9 12.0* 12.5* 11.5*  HCT 44.0 41.0 40.2 40.4 36.4*  PLT 147*  --  104* 95* 112*  MCV 102.1*  --  102.3* 98.3 96.8  MCH 30.6  --  30.5 30.4 30.6  MCHC 30.0  --  29.9* 30.9 31.6  RDW 16.6*  --  16.6* 16.4* 16.2*  LYMPHSABS 0.6*  --   --   --   --   MONOABS 1.3*  --   --   --   --   EOSABS 0.1  --   --   --   --   BASOSABS 0.0  --   --   --   --      Chemistries  Recent Labs  Lab 05/20/21 1310 05/20/21 1341 05/21/21 0427 05/21/21 1537 05/21/21 1744 05/22/21 0151 05/23/21 0114  NA 138 136 135  --   --  128* 131*  K 4.5 6.6* 4.3  --   --  4.9 4.3  CL 92*  --  88*  --   --  85* 93*  CO2 30  --  30  --   --  26 26  GLUCOSE 162*  --  99 111* 63* 74 89  BUN 22  --  27*  --   --  32* 20  CREATININE 8.37*  --  9.59*  --   --  10.91* 7.12*  CALCIUM 9.4  --  9.0  --   --  8.9 8.9  MG  --   --  2.2  --   --   --   --   AST 17  --   --   --   --   --   --   ALT 10  --   --   --   --   --   --   ALKPHOS 81  --   --   --   --   --   --   BILITOT 0.6  --   --   --   --   --   --    ------------------------------------------------------------------------------------------------------------------ No results for input(s): CHOL, HDL, LDLCALC, TRIG, CHOLHDL, LDLDIRECT in the last 72 hours.  Lab Results  Component Value Date   HGBA1C 4.7 (L) 05/21/2021   ------------------------------------------------------------------------------------------------------------------ No results for input(s): TSH, T4TOTAL, T3FREE, THYROIDAB in the last 72 hours.  Invalid input(s): FREET3 ------------------------------------------------------------------------------------------------------------------ Recent Labs    05/21/21 0427  FERRITIN 1,446*    Coagulation profile No results for input(s): INR, PROTIME in the last 168 hours.  Recent Labs    05/21/21 0427 05/22/21 0151  DDIMER 2.31* 3.96*    Cardiac Enzymes No results for input(s): CKMB, TROPONINI, MYOGLOBIN in the last 168 hours.  Invalid input(s): CK ------------------------------------------------------------------------------------------------------------------    Component Value Date/Time   BNP 502.4 (H) 05/21/2021 0427     Radiology Reports CT HEAD WO CONTRAST (5MM)  Result Date: 05/20/2021 CLINICAL DATA:  Status post fall. In stage renal disease on hemodialysis. History  of Sjogren syndrome. EXAM: CT HEAD WITHOUT CONTRAST CT CERVICAL SPINE WITHOUT CONTRAST TECHNIQUE: Multidetector CT imaging of the head and cervical spine was performed following the standard protocol without intravenous contrast. Multiplanar CT image reconstructions of the cervical spine were also generated. COMPARISON:  CT head 04/22/2015.  Chest CT 11/06/2015. FINDINGS: CT HEAD FINDINGS Brain: There is no evidence of acute intracranial hemorrhage, mass lesion, brain edema or extra-axial fluid collection. Mildly progressive atrophy with prominence of the ventricles and subarachnoid spaces there is confluent low-density in the periventricular white matter which has progressed, likely due to progressive chronic small vessel ischemic changes. There is no CT evidence of acute cortical infarction. Vascular: Prominent intracranial vascular calcifications. No hyperdense vessel identified. Skull: Negative for fracture or focal lesion. Sinuses/Orbits: The visualized paranasal sinuses and mastoid air cells are clear. No orbital abnormalities are seen. Other: None. CT CERVICAL SPINE FINDINGS Alignment: Normal Skull base and vertebrae: No evidence of acute fracture or traumatic subluxation. There are scattered tiny lucent lesions throughout the vertebral bodies which may relate to chronic renal failure/hyperparathyroidism. Appearance is similar to prior chest CT, making a neoplastic process unlikely. Soft tissues and spinal canal: No prevertebral fluid or swelling. No visible canal hematoma. Disc levels: Multilevel cervical spondylosis with loss of disc height, uncinate spurring and facet hypertrophy. Resulting mild foraminal narrowing at multiple levels. No large disc herniation identified. Upper chest: Unremarkable. Other: Mild bilateral carotid atherosclerosis. IMPRESSION: 1. No acute intracranial or calvarial findings. 2. Mildly progressive atrophy and chronic small vessel ischemic changes. 3. No evidence of acute cervical  spine fracture, traumatic subluxation or static signs of instability. 4. Mild multilevel spondylosis. Electronically Signed   By: Richardean Sale M.D.   On: 05/20/2021 13:36   CT Cervical Spine Wo Contrast  Result Date: 05/20/2021 CLINICAL DATA:  Status post fall. In stage renal disease on hemodialysis. History of Sjogren  syndrome. EXAM: CT HEAD WITHOUT CONTRAST CT CERVICAL SPINE WITHOUT CONTRAST TECHNIQUE: Multidetector CT imaging of the head and cervical spine was performed following the standard protocol without intravenous contrast. Multiplanar CT image reconstructions of the cervical spine were also generated. COMPARISON:  CT head 04/22/2015.  Chest CT 11/06/2015. FINDINGS: CT HEAD FINDINGS Brain: There is no evidence of acute intracranial hemorrhage, mass lesion, brain edema or extra-axial fluid collection. Mildly progressive atrophy with prominence of the ventricles and subarachnoid spaces there is confluent low-density in the periventricular white matter which has progressed, likely due to progressive chronic small vessel ischemic changes. There is no CT evidence of acute cortical infarction. Vascular: Prominent intracranial vascular calcifications. No hyperdense vessel identified. Skull: Negative for fracture or focal lesion. Sinuses/Orbits: The visualized paranasal sinuses and mastoid air cells are clear. No orbital abnormalities are seen. Other: None. CT CERVICAL SPINE FINDINGS Alignment: Normal Skull base and vertebrae: No evidence of acute fracture or traumatic subluxation. There are scattered tiny lucent lesions throughout the vertebral bodies which may relate to chronic renal failure/hyperparathyroidism. Appearance is similar to prior chest CT, making a neoplastic process unlikely. Soft tissues and spinal canal: No prevertebral fluid or swelling. No visible canal hematoma. Disc levels: Multilevel cervical spondylosis with loss of disc height, uncinate spurring and facet hypertrophy. Resulting mild  foraminal narrowing at multiple levels. No large disc herniation identified. Upper chest: Unremarkable. Other: Mild bilateral carotid atherosclerosis. IMPRESSION: 1. No acute intracranial or calvarial findings. 2. Mildly progressive atrophy and chronic small vessel ischemic changes. 3. No evidence of acute cervical spine fracture, traumatic subluxation or static signs of instability. 4. Mild multilevel spondylosis. Electronically Signed   By: Richardean Sale M.D.   On: 05/20/2021 13:36   DG Chest Port 1 View  Result Date: 05/20/2021 CLINICAL DATA:  Questionable sepsis EXAM: PORTABLE CHEST 1 VIEW COMPARISON:  Chest radiograph 10/16/2020 FINDINGS: The heart is at the upper limits of normal for size, increased compared to the prior chest radiograph. The mediastinal contours are within normal limits. There is calcified atherosclerotic plaque of the aortic arch. A right subclavian vascular stent is again noted. There is no focal consolidation or pulmonary edema. There is no pleural effusion or pneumothorax. There is no acute osseous abnormality. IMPRESSION: Increased size of the cardiac silhouette compared to the study from 10/16/2020. Given the patient has had pericardial effusions in the past, consider correlation with ECHO. Electronically Signed   By: Valetta Mole M.D.   On: 05/20/2021 13:39   CT CHEST ABDOMEN PELVIS WO CONTRAST  Result Date: 05/20/2021 CLINICAL DATA:  Fever. EXAM: CT CHEST, ABDOMEN AND PELVIS WITHOUT CONTRAST TECHNIQUE: Multidetector CT imaging of the chest, abdomen and pelvis was performed following the standard protocol without IV contrast. COMPARISON:  October 17, 2015. FINDINGS: CT CHEST FINDINGS Cardiovascular: Atherosclerosis of thoracic aorta is noted without aneurysm formation. Normal cardiac size. No pericardial effusion. Coronary artery calcifications are noted. Mediastinum/Nodes: No enlarged mediastinal, hilar, or axillary lymph nodes. Thyroid gland, trachea, and esophagus  demonstrate no significant findings. Lungs/Pleura: No pneumothorax or pleural effusion is noted. Minimal bibasilar subsegmental atelectasis or scarring is noted. Musculoskeletal: No chest wall mass or suspicious bone lesions identified. CT ABDOMEN PELVIS FINDINGS Hepatobiliary: Cholelithiasis is noted. No biliary dilatation is noted. The liver is unremarkable. Pancreas: Unremarkable. No pancreatic ductal dilatation or surrounding inflammatory changes. Spleen: Normal in size without focal abnormality. Adrenals/Urinary Tract: Adrenal glands appear normal. Multiple small bilateral renal cysts are noted. No hydronephrosis or renal obstruction is noted. No renal or  ureteral calculi are noted. Urinary bladder is unremarkable. Stomach/Bowel: The stomach appears normal. There is no evidence of bowel obstruction or inflammation. Vascular/Lymphatic: Aortic atherosclerosis. No enlarged abdominal or pelvic lymph nodes. IVC filter is in infrarenal position. Reproductive: Moderate prostatic enlargement is noted. Other: No abdominal wall hernia or abnormality. No abdominopelvic ascites. Musculoskeletal: No acute or significant osseous findings. IMPRESSION: Cholelithiasis. Coronary artery calcifications are noted suggesting coronary artery disease. Multiple bilateral renal cysts are noted. Moderate prostatic enlargement. No acute abnormality seen in the chest, abdomen or pelvis. Aortic Atherosclerosis (ICD10-I70.0). Electronically Signed   By: Marijo Conception M.D.   On: 05/20/2021 16:02

## 2021-05-23 NOTE — Progress Notes (Signed)
Physical Therapy Treatment Patient Details Name: Travis [name] MRN: [id number] DOB: 1941-05-05 Today's Date: 05/23/2021    History of Present Illness 80 y.o. male presents to [facility] ED on 05/20/2021 from dialysis center with hypoglycemia, fatigue, and fever. Pt found to be COVID-19+. PMH includes ESRD on HD, pulmonary fibrosis with chronic hypoxic respiratory failure on 2 L of oxygen at home, Sjogren's syndrome, peripheral neuropathy.    PT Comments    Pt supine in bed on arrival this session.  Pt required max cues for encouragement to participate in PT session.  Pt performed increased gt with decreased assistance and is making slow progress.  He remains to present with slow cadence which puts him at increased risk for falls. Will continue to follow as he has potential to improve with increased therapies.     Follow Up Recommendations  SNF;Supervision for mobility/OOB (unless family feels they can adequately support him.  He remains a fall risk and will need assistance.)     Equipment Recommendations  None recommended by PT    Recommendations for Other Services       Precautions / Restrictions Precautions Precautions: Fall Restrictions Weight Bearing Restrictions: No    Mobility  Bed Mobility Overal bed mobility: Needs Assistance Bed Mobility: Supine to Sit;Sit to Supine     Supine to sit: Min guard Sit to supine: Min assist   General bed mobility comments: Increased time and effort to rise into sitting ,  Mod cues for hand placement. Min assistance to return back to bed.    Transfers Overall transfer level: Needs assistance Equipment used: Rolling walker (2 wheeled) Transfers: Sit to/from Stand Sit to Stand: From elevated surface;Min guard         General transfer comment: Cues for hand placement to and from seated surface.  Pt holding to RW when returning to sitting. Cues for hip extension and posture in standing.  Ambulation/Gait Ambulation/Gait assistance: Min  guard Gait Distance (Feet): 50 Feet Assistive device: Rolling walker (2 wheeled)   Gait velocity: significantly decreased putting him at an increased risk for falls.   General Gait Details: Pt continues to present with forward trunk flexion.  Cues for RW safety and forward gaze.  pt able to increase gt distance and follow commands for safety during turns and backing.  His gt speed remains very slowly   Chief Strategy Officer    Modified Rankin (Stroke Patients Only)       Balance Overall balance assessment: Needs assistance;History of Falls Sitting-balance support: Feet supported;No upper extremity supported Sitting balance-Leahy Scale: Fair     Standing balance support: Bilateral upper extremity supported;During functional activity Standing balance-Leahy Scale: Poor                              Cognition Arousal/Alertness: Awake/alert Behavior During Therapy: Flat affect Overall Cognitive Status: No family/caregiver present to determine baseline cognitive functioning                                 General Comments: Pt required conistant encouragement and education throughout session to participate in PT session.      Exercises      General Comments        Pertinent Vitals/Pain Pain Assessment: No/denies pain    Home Living  Prior Function            PT Goals (current goals can now be found in the care plan section) Acute Rehab PT Goals Patient Stated Goal: feel better, go home soon. see family PT Goal Formulation: With patient Potential to Achieve Goals: Good Progress towards PT goals: Progressing toward goals    Frequency    Min 3X/week      PT Plan Current plan remains appropriate    Co-evaluation              AM-PAC PT "6 Clicks" Mobility   Outcome Measure  Help needed turning from your back to your side while in a flat bed without using bedrails?: A  Little Help needed moving from lying on your back to sitting on the side of a flat bed without using bedrails?: A Little Help needed moving to and from a bed to a chair (including a wheelchair)?: A Little Help needed standing up from a chair using your arms (e.g., wheelchair or bedside chair)?: A Little Help needed to walk in hospital room?: A Little Help needed climbing 3-5 steps with a railing? : A Lot 6 Click Score: 17    End of Session Equipment Utilized During Treatment: Gait belt Activity Tolerance: Patient tolerated treatment well;Patient limited by fatigue Patient left: with call bell/phone within reach;with family/visitor present;in bed;with bed alarm set Nurse Communication: Mobility status PT Visit Diagnosis: Unsteadiness on feet (R26.81);Other abnormalities of gait and mobility (R26.89);Muscle weakness (generalized) (M62.81)     Time: [id number] PT Time Calculation (min) (ACUTE ONLY): 26 min  Charges:  $Gait Training: 8-22 mins $Therapeutic Activity: 8-22 mins                     Erasmo Leventhal , PTA Acute Rehabilitation Services Pager [phone] Office [phone]    Charliee Krenz Eli Hose 05/23/2021, 12:50 PM

## 2021-05-23 NOTE — Progress Notes (Signed)
Please give this RN and any other RNs that come to help a badge that does not have one.  This would decrease the amount of errors and safety issues for the patient.   Thank you, Teola Bradley, RN

## 2021-05-23 NOTE — Progress Notes (Signed)
Patient arrived from HD to unit without report being given. Patient had two IVs prior to going to HD but both were out upon his return. Patient's CBG was 52. No CBG done in HD. B/p elevated to 200/90.   Offered patient something to eat in attempt to raise his CBG. Patient refused. He said that, "he was tired drinking juice". When asked if he wanted pudding, applesauce etc, patient said "no".   This RN had to lay it out and explain to the patient that his sugar was low, b/p high and I had no IV access to work with. Informed patient that I needed him to eat something so that his CBG wouldn't drop any more. Patient agreed.   Notified IV team of need for IV ASAP because of the above issues. IV team was able to get an IV after two attempts with [place]. IV team took the ensure and crackers into patient's room for this RN. Patient ate some of it.  CBG retaken 35. Once IV was established, D50 was given and patient's CBG 159. MD was made aware of the above.

## 2021-05-23 NOTE — Progress Notes (Signed)
[facility] Progress Note   Subjective:   Patient seen and examined at bedside.  No specific complaints today.  Hypoglycemic events overnight.  Improved this AM.  Apologizing for acting out the night before.  Denies CP, SOB, edema, orthopnea, weakness, dizziness and fatigue.   Objective Vitals:   05/22/21 2049 05/23/21 0015 05/23/21 0307 05/23/21 0834  BP: (!) 189/85 (!) 167/68 (!) 166/70 (!) 160/68  Pulse: 72 72 63 63  Resp: '20 17 15 19  '$ Temp: 99.3 F (37.4 C) 98.8 F (37.1 C) 98.4 F (36.9 C) 98.8 F (37.1 C)  TempSrc: Oral Oral Oral Oral  SpO2: 100% 99% 95% 100%  Weight:      Height:       Physical Exam General:well appearing male in NAD Heart:RRR,  no mrg Lungs:mostly CTAB, +crackles in LLL, nml WOB on  2L via [place] Abdomen:soft, NTND Extremities:no LE edema Dialysis Access: RU AVG +b/t   Filed Weights   05/20/21 1256 05/22/21 1555 05/22/21 1923  Weight: 75 kg 78.2 kg 76.5 kg    Intake/Output Summary (Last 24 hours) at 05/23/2021 1217 Last data filed at 05/23/2021 0521 Gross per 24 hour  Intake 619.21 ml  Output 1725 ml  Net -1105.79 ml    Additional Objective Labs: Basic Metabolic Panel: Recent Labs  Lab 05/21/21 0427 05/21/21 1537 05/21/21 1744 05/22/21 0151 05/23/21 0114  NA 135  --   --  128* 131*  K 4.3  --   --  4.9 4.3  CL 88*  --   --  85* 93*  CO2 30  --   --  26 26  GLUCOSE 99   < > 63* 74 89  BUN 27*  --   --  32* 20  CREATININE 9.59*  --   --  10.91* 7.12*  CALCIUM 9.0  --   --  8.9 8.9  PHOS 5.2*  --   --  5.8* 4.7*   < > = values in this interval not displayed.   Liver Function Tests: Recent Labs  Lab 05/20/21 1310 05/22/21 0151 05/23/21 0114  AST 17  --   --   ALT 10  --   --   ALKPHOS 81  --   --   BILITOT 0.6  --   --   PROT 8.3*  --   --   ALBUMIN 4.1 3.1* 2.9*    CBC: Recent Labs  Lab 05/20/21 1310 05/20/21 1341 05/21/21 0427 05/22/21 0151 05/23/21 0114  WBC 7.5  --  5.3 8.0 6.5  NEUTROABS 5.5  --    --   --   --   HGB 13.2   < > 12.0* 12.5* 11.5*  HCT 44.0   < > 40.2 40.4 36.4*  MCV 102.1*  --  102.3* 98.3 96.8  PLT 147*  --  104* 95* 112*   < > = values in this interval not displayed.   Blood Culture    Component Value Date/Time   SDES BLOOD LEFT ARM 05/20/2021 1310   SPECREQUEST  05/20/2021 1310    BOTTLES DRAWN AEROBIC AND ANAEROBIC Blood Culture results may not be optimal due to an inadequate volume of blood received in culture bottles   CULT  05/20/2021 1310    NO GROWTH 2 DAYS Performed at [facility], [place] [address] [place], [place] [address]    REPTSTATUS PENDING 05/20/2021 1310    CBG: Recent Labs  Lab 05/23/21 0007 05/23/21 0316 05/23/21 0327 05/23/21  0841 05/23/21 1105  GLUCAP 88 68* 92 92 83   Iron Studies:  Recent Labs    05/21/21 0427  FERRITIN 1,446*   Lab Results  Component Value Date   INR 1.42 07/28/2015   INR 1.20 05/14/2015   INR 1.15 04/19/2015   Studies/Results: CT ABDOMEN PELVIS W CONTRAST  Result Date: 05/23/2021 CLINICAL DATA:  80 year old male with a history of pancreatic cancer and abdominal pain with nausea vomiting EXAM: CT ABDOMEN AND PELVIS WITH CONTRAST TECHNIQUE: Multidetector CT imaging of the abdomen and pelvis was performed using the standard protocol following bolus administration of intravenous contrast. CONTRAST:  33m OMNIPAQUE IOHEXOL 300 MG/ML  SOLN COMPARISON:  CT 05/20/2021, 07/28/2015, chest CT 11/06/2015 FINDINGS: Lower chest: No pleural effusion. Atelectatic changes. Nodule at the left lung base measures 6 mm. This nodule has grown from the prior CT dated 11/06/2015. Hepatobiliary: Unremarkable appearance of the liver. Radiopaque gallstones within the gallbladder lumen. No pericholecystic inflammatory changes. Unremarkable appearance of the common bile duct. Pancreas: Unremarkable Spleen: Unremarkable Adrenals/Urinary Tract: - Right adrenal gland:  Unremarkable - Left adrenal gland: Unremarkable. - Right  kidney: No hydronephrosis. No nephrolithiasis. Compared to remote CTs there is renal cortical thinning, with mild perirenal inflammatory changes symmetric to the left. Developing cystic change. - Left Kidney: No hydronephrosis or nephrolithiasis. Symmetric to the right there is renal cortical thinning compared to prior CT scans, with mild perirenal inflammatory changes and developing cystic change. - Urinary Bladder: Urinary bladder decompressed. Stomach/Bowel: - Stomach: Hiatal hernia.  Stomach unremarkable. - Small bowel: Small bowel unremarkable with no distention. No focal wall thickening. - Appendix: Normal. - Colon: Colon is without abnormal distention. Mild-to-moderate stool burden. Colonic diverticular disease. No focal wall thickening or inflammatory changes. Vascular/Lymphatic: Atherosclerosis of the abdominal aorta. No aneurysm. No dissection. No periaortic inflammatory changes. Mild atherosclerosis at the renal artery origin and the mesenteric artery origin without evidence of occlusion or high-grade stenosis. Bilateral iliac and proximal femoral arteries are patent. No mesenteric or retroperitoneal adenopathy. No inguinal adenopathy. No pelvic adenopathy. IVC filter in place. Reproductive: Prostate measures 4.8 cm. Other: Fat containing left inguinal hernia. Musculoskeletal: Degenerative changes of the spine. No acute displaced fracture. No bony canal narrowing. IMPRESSION: The given history is of pancreatic carcinoma. I see no evidence of pancreatic carcinoma on the current CT, and would advise correlation with the patient's history and potentially further MRI imaging if there is ongoing concern. Developing cystic change of the bilateral kidneys and associated renal cortical thinning, which is most likely chronic renal disease and cystic change of chronic renal failure given the patient's history of dialysis. Strictly speaking, ascending urinary tract infection can not be excluded by CT. Cholelithiasis  without evidence acute inflammatory changes. 6 mm nodule at the left lung base. Non-contrast chest CT at 6-12 months is recommended. If the nodule is stable at time of repeat CT, then future CT at 18-24 months (from today's scan) is considered optional for low-risk patients, but is recommended for high-risk patients. This recommendation follows the consensus statement: Guidelines for Management of Incidental Pulmonary Nodules Detected on CT Images: From the Fleischner Society 2017; Radiology 2017; 284:228-243. Additional ancillary findings as above. Electronically Signed   By: JCorrie MckusickD.O.   On: 05/23/2021 12:12    Medications:  dextrose 40 mL/hr at 05/23/21 1059   remdesivir 100 mg in NS 100 mL 100 mg (05/23/21 1148)    aspirin EC  81 mg Oral Daily   atorvastatin  40 mg Oral q1800  Chlorhexidine Gluconate Cloth  6 each Topical Q0600   cinacalcet  90 mg Oral Q M,W,F-HD   dorzolamide-timolol  1 drop Both Eyes BID   gabapentin  300 mg Oral QPM   heparin  5,000 Units Subcutaneous Q12H   latanoprost  1 drop Both Eyes QHS   methylPREDNISolone (SOLU-MEDROL) injection  40 mg Intravenous Once   pantoprazole  40 mg Oral Daily   predniSONE  20 mg Oral BID WC   sevelamer carbonate  1,600 mg Oral TID WC   vitamin B-12  500 mcg Oral Daily    Dialysis Orders: [facility] MWF  3.5h  400/1.5   74.5kg  2/2 bath  RUA AVG  Heparin none  - sensipar 90 tiw   Assessment/Plan: Hypoglycemia - per primary team Encephalopathy - thought to be due to #1.  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 Nodule noted in LLL. COVID - tested + for COVID infection, Afebrile x 24hs, no signs of PNA by CXR. S/p IV rocephin & Vanc for possible sepsis. Remdesivir started.  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 HD completed overnight.  Next HD on 05/25/21.  BP/volume -   BP's normal to high, Not on antihypertensives at home.  CT/CXR with no signs of volume overload.  Remains 2L over dry post HD.  Plan for UF to dry weight as tolerated on  Monday.    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 ferritin 1446.  No indication for ESA.  No iron. MBD ckd - cont sensipar, binders Nutrition - Currently on heart healthy diet with K/phos in goal.  Continue to monitor labs and if ^, will change to renal diet.  Added fluid restriction.   Jen Mow, PA-C [facility] 05/23/2021,12:17 PM  LOS: 3 days

## 2021-05-23 NOTE — Progress Notes (Signed)
HD RN came and spoke with me concerning patient's loss of IV access, no CBG done and b/p.  HD RN has no badge so that she can scan to do CBGs. This is a problem and safety issue. This patient's CBG upon arrival to unit was 52. If the RN would have had access to a badge, she could have corrected the CBG.  As far as the report, this RN had no resources/numbers so that she could call report to unit.   The HD RN said that she was in small room that was on 5th floor. Not actually in HD unit due to patient having covid.

## 2021-05-24 LAB — GLUCOSE, CAPILLARY
Glucose-Capillary: 114 mg/dL — ABNORMAL HIGH (ref 70–99)
Glucose-Capillary: 114 mg/dL — ABNORMAL HIGH (ref 70–99)
Glucose-Capillary: 132 mg/dL — ABNORMAL HIGH (ref 70–99)
Glucose-Capillary: 138 mg/dL — ABNORMAL HIGH (ref 70–99)
Glucose-Capillary: 138 mg/dL — ABNORMAL HIGH (ref 70–99)
Glucose-Capillary: 92 mg/dL (ref 70–99)

## 2021-05-24 MED ORDER — AMLODIPINE BESYLATE 10 MG PO TABS
10.0000 mg | ORAL_TABLET | Freq: Every day | ORAL | Status: DC
  Administered 2021-05-24 – 2021-05-26 (×3): 10 mg via ORAL
  Filled 2021-05-24 (×3): qty 1

## 2021-05-24 MED ORDER — HYDRALAZINE HCL 25 MG PO TABS
25.0000 mg | ORAL_TABLET | Freq: Three times a day (TID) | ORAL | Status: DC
  Administered 2021-05-24 – 2021-05-26 (×6): 25 mg via ORAL
  Filled 2021-05-24 (×6): qty 1

## 2021-05-24 MED ORDER — CHLORHEXIDINE GLUCONATE CLOTH 2 % EX PADS
6.0000 | MEDICATED_PAD | Freq: Every day | CUTANEOUS | Status: DC
  Administered 2021-05-25: 6 via TOPICAL

## 2021-05-24 MED ORDER — PREDNISONE 5 MG PO TABS
15.0000 mg | ORAL_TABLET | Freq: Two times a day (BID) | ORAL | Status: DC
  Administered 2021-05-24 – 2021-05-26 (×3): 15 mg via ORAL
  Filled 2021-05-24 (×4): qty 1

## 2021-05-24 NOTE — Progress Notes (Signed)
[facility] Progress Note   Subjective:   Patient seen and examined at bedside.  No specific complaints.  Denies CP, SOB, abdominal pain, dizziness and n/v/d.    Objective Vitals:   05/24/21 0028 05/24/21 0518 05/24/21 0828 05/24/21 1104  BP: (!) 156/72 (!) 155/64 (!) 163/65 (!) 167/75  Pulse: 61 60 60 60  Resp: '14 18 17 12  '$ Temp: 98.4 F (36.9 C) 98.5 F (36.9 C) 97.9 F (36.6 C) 98.3 F (36.8 C)  TempSrc: Oral Oral Oral Oral  SpO2: 100% 100% 100% 100%  Weight:      Height:       Physical Exam General:well appearing male in NAD Heart:RRR, no mrg Lungs:mostly CTAB anterolaterally, nml WOB on RA Abdomen:soft, NTND Extremities:trace LE edema Dialysis Access: RU AVG +b/t   Filed Weights   05/20/21 1256 05/22/21 1555 05/22/21 1923  Weight: 75 kg 78.2 kg 76.5 kg    Intake/Output Summary (Last 24 hours) at 05/24/2021 1159 Last data filed at 05/23/2021 2156 Gross per 24 hour  Intake 559.2 ml  Output --  Net 559.2 ml    Additional Objective Labs: Basic Metabolic Panel: Recent Labs  Lab 05/21/21 0427 05/21/21 1537 05/21/21 1744 05/22/21 0151 05/23/21 0114  NA 135  --   --  128* 131*  K 4.3  --   --  4.9 4.3  CL 88*  --   --  85* 93*  CO2 30  --   --  26 26  GLUCOSE 99   < > 63* 74 89  BUN 27*  --   --  32* 20  CREATININE 9.59*  --   --  10.91* 7.12*  CALCIUM 9.0  --   --  8.9 8.9  PHOS 5.2*  --   --  5.8* 4.7*   < > = values in this interval not displayed.   Liver Function Tests: Recent Labs  Lab 05/20/21 1310 05/22/21 0151 05/23/21 0114  AST 17  --   --   ALT 10  --   --   ALKPHOS 81  --   --   BILITOT 0.6  --   --   PROT 8.3*  --   --   ALBUMIN 4.1 3.1* 2.9*    CBC: Recent Labs  Lab 05/20/21 1310 05/20/21 1341 05/21/21 0427 05/22/21 0151 05/23/21 0114  WBC 7.5  --  5.3 8.0 6.5  NEUTROABS 5.5  --   --   --   --   HGB 13.2   < > 12.0* 12.5* 11.5*  HCT 44.0   < > 40.2 40.4 36.4*  MCV 102.1*  --  102.3* 98.3 96.8  PLT 147*   --  104* 95* 112*   < > = values in this interval not displayed.   Blood Culture    Component Value Date/Time   SDES BLOOD LEFT ARM 05/20/2021 1310   SPECREQUEST  05/20/2021 1310    BOTTLES DRAWN AEROBIC AND ANAEROBIC Blood Culture results may not be optimal due to an inadequate volume of blood received in culture bottles   CULT  05/20/2021 1310    NO GROWTH 3 DAYS Performed at [facility], [place] [address] [place], [place] [address]    REPTSTATUS PENDING 05/20/2021 1310    CBG: Recent Labs  Lab 05/23/21 2045 05/24/21 0033 05/24/21 0520 05/24/21 0832 05/24/21 1108  GLUCAP 133* 132* 138* 138* 92    Studies/Results: CT ABDOMEN PELVIS W CONTRAST  Result Date: 05/23/2021 CLINICAL  DATA:  80 year old male with a history of pancreatic cancer and abdominal pain with nausea vomiting EXAM: CT ABDOMEN AND PELVIS WITH CONTRAST TECHNIQUE: Multidetector CT imaging of the abdomen and pelvis was performed using the standard protocol following bolus administration of intravenous contrast. CONTRAST:  45m OMNIPAQUE IOHEXOL 300 MG/ML  SOLN COMPARISON:  CT 05/20/2021, 07/28/2015, chest CT 11/06/2015 FINDINGS: Lower chest: No pleural effusion. Atelectatic changes. Nodule at the left lung base measures 6 mm. This nodule has grown from the prior CT dated 11/06/2015. Hepatobiliary: Unremarkable appearance of the liver. Radiopaque gallstones within the gallbladder lumen. No pericholecystic inflammatory changes. Unremarkable appearance of the common bile duct. Pancreas: Unremarkable Spleen: Unremarkable Adrenals/Urinary Tract: - Right adrenal gland:  Unremarkable - Left adrenal gland: Unremarkable. - Right kidney: No hydronephrosis. No nephrolithiasis. Compared to remote CTs there is renal cortical thinning, with mild perirenal inflammatory changes symmetric to the left. Developing cystic change. - Left Kidney: No hydronephrosis or nephrolithiasis. Symmetric to the right there is renal cortical thinning  compared to prior CT scans, with mild perirenal inflammatory changes and developing cystic change. - Urinary Bladder: Urinary bladder decompressed. Stomach/Bowel: - Stomach: Hiatal hernia.  Stomach unremarkable. - Small bowel: Small bowel unremarkable with no distention. No focal wall thickening. - Appendix: Normal. - Colon: Colon is without abnormal distention. Mild-to-moderate stool burden. Colonic diverticular disease. No focal wall thickening or inflammatory changes. Vascular/Lymphatic: Atherosclerosis of the abdominal aorta. No aneurysm. No dissection. No periaortic inflammatory changes. Mild atherosclerosis at the renal artery origin and the mesenteric artery origin without evidence of occlusion or high-grade stenosis. Bilateral iliac and proximal femoral arteries are patent. No mesenteric or retroperitoneal adenopathy. No inguinal adenopathy. No pelvic adenopathy. IVC filter in place. Reproductive: Prostate measures 4.8 cm. Other: Fat containing left inguinal hernia. Musculoskeletal: Degenerative changes of the spine. No acute displaced fracture. No bony canal narrowing. IMPRESSION: The given history is of pancreatic carcinoma. I see no evidence of pancreatic carcinoma on the current CT, and would advise correlation with the patient's history and potentially further MRI imaging if there is ongoing concern. Developing cystic change of the bilateral kidneys and associated renal cortical thinning, which is most likely chronic renal disease and cystic change of chronic renal failure given the patient's history of dialysis. Strictly speaking, ascending urinary tract infection can not be excluded by CT. Cholelithiasis without evidence acute inflammatory changes. 6 mm nodule at the left lung base. Non-contrast chest CT at 6-12 months is recommended. If the nodule is stable at time of repeat CT, then future CT at 18-24 months (from today's scan) is considered optional for low-risk patients, but is recommended for  high-risk patients. This recommendation follows the consensus statement: Guidelines for Management of Incidental Pulmonary Nodules Detected on CT Images: From the Fleischner Society 2017; Radiology 2017; 284:228-243. Additional ancillary findings as above. Electronically Signed   By: JCorrie MckusickD.O.   On: 05/23/2021 12:12    Medications:  dextrose 40 mL/hr at 05/23/21 2156   remdesivir 100 mg in NS 100 mL 100 mg (05/24/21 1102)    amLODipine  10 mg Oral Daily   aspirin EC  81 mg Oral Daily   atorvastatin  40 mg Oral q1800   Chlorhexidine Gluconate Cloth  6 each Topical Q0600   cinacalcet  90 mg Oral Q M,W,F-HD   dorzolamide-timolol  1 drop Both Eyes BID   gabapentin  300 mg Oral QPM   heparin  5,000 Units Subcutaneous Q12H   hydrALAZINE  25 mg Oral Q8H  latanoprost  1 drop Both Eyes QHS   pantoprazole  40 mg Oral Daily   predniSONE  15 mg Oral BID WC   sevelamer carbonate  1,600 mg Oral TID WC   vitamin B-12  500 mcg Oral Daily    Dialysis Orders: [facility] MWF  3.5h  400/1.5   74.5kg  2/2 bath  RUA AVG  Heparin none  - sensipar 90 tiw   Assessment/Plan: Hypoglycemia - per primary team Encephalopathy - resolved. Thought to be due to #1.  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 Nodule noted in LLL on CT. COVID - tested + for COVID infection, Afebrile x 24hs, no signs of PNA by CXR. S/p IV rocephin & Vanc for possible sepsis. Remdesivir started.  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 HD completed overnight.  Next HD on 05/25/21.  BP/volume -   BP's elevated today. Not on antihypertensives at home.  CT/CXR with no signs of volume overload.  Remains 2L over dry post HD.  Plan for UF to dry weight as tolerated on Monday.    Anemia ckd - last Hgb 11.5. ferritin 1446.  No indication for ESA.  No iron with increased ferritin. MBD ckd - cont sensipar, binders Nutrition - Currently on heart healthy diet with K/phos in goal.  Continue to monitor labs and if ^, will change to renal diet.  Added  fluid restriction.   Jen Mow, PA-C [facility] 05/24/2021,11:59 AM  LOS: 4 days

## 2021-05-24 NOTE — NC FL2 (Addendum)
[place] MEDICAID FL2 LEVEL OF CARE SCREENING TOOL     IDENTIFICATION  Patient Name: Travis [name] Birthdate: 07-Dec-1940 Sex: male Admission Date (Current Location): 05/20/2021  [facility] and [place] Number:  Herbalist and Address:  [facility], [place] [address] [place], [place] [address]      Provider Number: [id number]  Attending Physician Name and Address:  Thurnell Lose, MD  Relative Name and Phone Number:  [name] [phone]    Current Level of Care: Hospital Recommended Level of Care: [place] Prior Approval Number:    Date Approved/Denied:   PASRR Number: [id number]  Discharge Plan: SNF    Current Diagnoses: Patient Active Problem List   Diagnosis Date Noted   Hypoglycemia 05/20/2021   Altered mental status    Allergy, unspecified, initial encounter 07/02/2019   Other fluid overload 04/04/2019   Closed fracture of left hip with routine healing 12/23/2016   Neuropathic pain 06/22/2016   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   Pericardial effusion 0000000   Chronic systolic CHF (congestive heart failure), NYHA class 2 ([place]) 11/05/2015   Sepsis ([place]) 11/05/2015   SIRS (systemic inflammatory response syndrome) ([place]) 11/05/2015   Chest pain on breathing    Thrombocytopenia ([place]) 08/11/2015   FTT (failure to thrive) in adult 08/11/2015   Cough    Rectal pain 07/28/2015   Fecal impaction ([place]) 07/28/2015   Rectal bleeding 07/28/2015   Chronic respiratory failure ([place]) 07/28/2015   Lower gastrointestinal bleed    Pulmonary edema 05/14/2015   Hypotension of hemodialysis [id number]   Acute systolic CHF (congestive heart failure) ([place]) 04/25/2015   Protein-calorie malnutrition, severe ([place]) 04/19/2015   NSTEMI (non-ST elevated myocardial infarction) ([place]) 04/18/2015   Other abnormal glucose 02/21/2015   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   Iron deficiency anemia, unspecified 02/20/2015    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   Pruritus, unspecified 02/20/2015   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   ESRD on dialysis [facility])    Acute renal failure syndrome (HCC)    AKI (acute kidney injury) ([place])    Palliative care encounter    Acute on chronic renal failure ([place]) 01/31/2015   Acute DVT of right tibial vein ([place]) 01/29/2015   Acute kidney injury ([place]) 01/29/2015   DVT (deep venous thrombosis) ([place]) 01/29/2015   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   Acute on chronic respiratory failure with hypoxia ([place]) 01/28/2015   Physical deconditioning 01/28/2015   Sjogren's disease ([place]) 01/28/2015   Pedal edema 01/28/2015   Peripheral edema 01/07/2015   HCAP (healthcare-associated pneumonia) 01/06/2015   Steroid-induced hyperglycemia 01/06/2015   Dyspnea    Pulmonary fibrosis (HCC)    Knee pain    Shoulder pain    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   Anasarca 12/17/2014   Fever 12/17/2014   Elevated blood pressure 12/17/2014   Normocytic anemia 12/17/2014    Orientation RESPIRATION BLADDER Height & Weight     Self (fluctuating)  Normal Continent Weight: 168 lb 10.4 oz (76.5 kg) Height:  '5\' 10"'$  (177.8 cm)  BEHAVIORAL SYMPTOMS/MOOD NEUROLOGICAL BOWEL NUTRITION STATUS      Incontinent Diet (See discharge summary)  AMBULATORY STATUS COMMUNICATION OF NEEDS Skin   Limited Assist Verbally Normal, Other (Comment) (dry)                       Personal Care Assistance Level of Assistance  Bathing, Feeding,  Dressing Bathing Assistance: Limited assistance Feeding assistance: Limited assistance Dressing Assistance: Limited assistance     Functional Limitations Info  Sight, Hearing, Speech Sight Info: Impaired Hearing Info: Adequate Speech Info: Adequate    SPECIAL CARE FACTORS FREQUENCY  PT (By licensed PT), OT (By licensed OT)     PT Frequency: 5 x per  week OT Frequency: 5x per week            Contractures Contractures Info: Not present    Additional Factors Info  Code Status, Allergies, Isolation Precautions Code Status Info: Full Allergies Info: Influenza Virus Vaccine     Isolation Precautions Info: COVID+     Current Medications (05/24/2021):  This is the current hospital active medication list Current Facility-Administered Medications  Medication Dose Route Frequency Provider Last Rate Last Admin   acetaminophen (TYLENOL) tablet 650 mg  650 mg Oral Q6H PRN Wynetta Fines T, MD       amLODipine (NORVASC) tablet 10 mg  10 mg Oral Daily Thurnell Lose, MD   10 mg at 05/24/21 1028   aspirin EC tablet 81 mg  81 mg Oral Daily Wynetta Fines T, MD   81 mg at 05/24/21 1027   atorvastatin (LIPITOR) tablet 40 mg  40 mg Oral q1800 Wynetta Fines T, MD   40 mg at 05/23/21 1745   Chlorhexidine Gluconate Cloth 2 % PADS 6 each  6 each Topical Q0600 Jonetta Osgood, MD   6 each at 05/24/21 0536   [START ON 05/25/2021] Chlorhexidine Gluconate Cloth 2 % PADS 6 each  6 each Topical Q0600 Penninger, Ria Comment, PA       cinacalcet (SENSIPAR) tablet 90 mg  90 mg Oral Q M,W,F-HD Roney Jaffe, MD       dextrose 10 % infusion   Intravenous Continuous Jonetta Osgood, MD 40 mL/hr at 05/23/21 2156 Infusion Verify at 05/23/21 2156   dextrose 50 % solution 50 mL  1 ampule Intravenous PRN Jonetta Osgood, MD   50 mL at 05/22/21 2210   dorzolamide-timolol (COSOPT) 22.3-6.8 MG/ML ophthalmic solution 1 drop  1 drop Both Eyes BID Wynetta Fines T, MD   1 drop at 05/24/21 1029   gabapentin (NEURONTIN) capsule 300 mg  300 mg Oral QPM Wynetta Fines T, MD   300 mg at 05/23/21 1745   heparin injection 5,000 Units  5,000 Units Subcutaneous Q12H Wynetta Fines T, MD   5,000 Units at 05/24/21 1028   hydrALAZINE (APRESOLINE) injection 10 mg  10 mg Intravenous Q6H PRN Jonetta Osgood, MD   10 mg at 05/21/21 1528   hydrALAZINE (APRESOLINE) tablet 25 mg  25 mg Oral Q8H  Singh, Prashant K, MD       latanoprost (XALATAN) 0.005 % ophthalmic solution 1 drop  1 drop Both Eyes QHS Wynetta Fines T, MD   1 drop at 05/23/21 2025   ondansetron (ZOFRAN) injection 4 mg  4 mg Intravenous Q6H PRN Wynetta Fines T, MD       pantoprazole (PROTONIX) EC tablet 40 mg  40 mg Oral Daily Wynetta Fines T, MD   40 mg at 05/24/21 1027   predniSONE (DELTASONE) tablet 15 mg  15 mg Oral BID WC Thurnell Lose, MD       remdesivir 100 mg in sodium chloride 0.9 % 100 mL IVPB  100 mg Intravenous Daily Bertis Ruddy, RPH 200 mL/hr at 05/24/21 1102 100 mg at 05/24/21 1102   sevelamer carbonate (RENVELA) tablet 1,600 mg  1,600 mg  Oral TID WC Wynetta Fines T, MD   1,600 mg at 05/24/21 1027   vitamin B-12 (CYANOCOBALAMIN) tablet 500 mcg  500 mcg Oral Daily Lequita Halt, MD   500 mcg at 05/24/21 1026     Discharge Medications: Please see discharge summary for a list of discharge medications.  Relevant Imaging Results:  Relevant Lab Results:   Additional Information SSN# [id number] Pt has HD at [facility]  MWF pt COVID+ onset 9/7  Bary Castilla, LCSW

## 2021-05-24 NOTE — Discharge Instructions (Signed)
Follow with Primary MD Koirala, Dibas, MD in 7 days   Get CBC, CMP, 2 view Chest X ray -  checked next visit within 1 week by Primary MD    Activity: As tolerated with Full fall precautions use walker/cane & assistance as needed  Disposition Home    Diet: Renal diet, 1.5lit/day fluid restriction  Check CBGs before each meal and bedtime, log all results and show it to your PCP  Special Instructions: If you have smoked or chewed Tobacco  in the last 2 yrs please stop smoking, stop any regular Alcohol  and or any Recreational drug use.  On your next visit with your primary care physician please Get Medicines reviewed and adjusted.  Please request your Prim.MD to go over all Hospital Tests and Procedure/Radiological results at the follow up, please get all Hospital records sent to your Prim MD by signing hospital release before you go home.  If you experience worsening of your admission symptoms, develop shortness of breath, life threatening emergency, suicidal or homicidal thoughts you must seek medical attention immediately by calling 911 or calling your MD immediately  if symptoms less severe.  You Must read complete instructions/literature along with all the possible adverse reactions/side effects for all the Medicines you take and that have been prescribed to you. Take any new Medicines after you have completely understood and accpet all the possible adverse reactions/side effects.

## 2021-05-24 NOTE — NC FL2 (Deleted)
[place] MEDICAID FL2 LEVEL OF CARE SCREENING TOOL     IDENTIFICATION  Patient Name: Travis [name] Birthdate: 1940-10-02 Sex: male Admission Date (Current Location): 05/20/2021  [facility] and [place] Number:  Herbalist and Address:  [facility], [place] [address], [place], [place] [address]      Provider Number: [id number]  Attending Physician Name and Address:  Thurnell Lose, MD  Relative Name and Phone Number:  [name] [phone]    Current Level of Care: Hospital Recommended Level of Care: [place] Prior Approval Number:    Date Approved/Denied:   PASRR Number: [id number]  Discharge Plan: SNF    Current Diagnoses: Patient Active Problem List   Diagnosis Date Noted   Hypoglycemia 05/20/2021   Altered mental status    Allergy, unspecified, initial encounter 07/02/2019   Other fluid overload 04/04/2019   Closed fracture of left hip with routine healing 12/23/2016   Neuropathic pain 06/22/2016   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   Pericardial effusion 0000000   Chronic systolic CHF (congestive heart failure), NYHA class 2 ([place]) 11/05/2015   Sepsis ([place]) 11/05/2015   SIRS (systemic inflammatory response syndrome) ([place]) 11/05/2015   Chest pain on breathing    Thrombocytopenia ([place]) 08/11/2015   FTT (failure to thrive) in adult 08/11/2015   Cough    Rectal pain 07/28/2015   Fecal impaction ([place]) 07/28/2015   Rectal bleeding 07/28/2015   Chronic respiratory failure ([place]) 07/28/2015   Lower gastrointestinal bleed    Pulmonary edema 05/14/2015   Hypotension of hemodialysis [id number]   Acute systolic CHF (congestive heart failure) ([place]) 04/25/2015   Protein-calorie malnutrition, severe ([place]) 04/19/2015   NSTEMI (non-ST elevated myocardial infarction) ([place]) 04/18/2015   Other abnormal glucose 02/21/2015   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   Iron deficiency anemia, unspecified 02/20/2015    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/20/2015   Pruritus, unspecified 02/20/2015   Secondary hyperparathyroidism of renal origin ([place]) 02/20/2015   ESRD on dialysis [facility])    Acute renal failure syndrome (HCC)    AKI (acute kidney injury) ([place])    Palliative care encounter    Acute on chronic renal failure ([place]) 01/31/2015   Acute DVT of right tibial vein ([place]) 01/29/2015   Acute kidney injury ([place]) 01/29/2015   DVT (deep venous thrombosis) ([place]) 01/29/2015   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   Acute on chronic respiratory failure with hypoxia ([place]) 01/28/2015   Physical deconditioning 01/28/2015   Sjogren's disease ([place]) 01/28/2015   Pedal edema 01/28/2015   Peripheral edema 01/07/2015   HCAP (healthcare-associated pneumonia) 01/06/2015   Steroid-induced hyperglycemia 01/06/2015   Dyspnea    Pulmonary fibrosis (HCC)    Knee pain    Shoulder pain    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   Anasarca 12/17/2014   Fever 12/17/2014   Elevated blood pressure 12/17/2014   Normocytic anemia 12/17/2014    Orientation RESPIRATION BLADDER Height & Weight     Self (fluctuating)  Normal Continent Weight: 168 lb 10.4 oz (76.5 kg) Height:  '5\' 10"'$  (177.8 cm)  BEHAVIORAL SYMPTOMS/MOOD NEUROLOGICAL BOWEL NUTRITION STATUS      Incontinent Diet (See discharge summary)  AMBULATORY STATUS COMMUNICATION OF NEEDS Skin   Limited Assist Verbally Normal, Other (Comment) (dry)                       Personal Care Assistance Level of Assistance  Bathing, Feeding,  Dressing Bathing Assistance: Limited assistance Feeding assistance: Limited assistance Dressing Assistance: Limited assistance     Functional Limitations Info  Sight, Hearing, Speech Sight Info: Impaired Hearing Info: Adequate Speech Info: Adequate    SPECIAL CARE FACTORS FREQUENCY  PT (By licensed PT), OT (By licensed OT)     PT Frequency: 5 x per  week OT Frequency: 5x per week            Contractures Contractures Info: Not present    Additional Factors Info  Code Status, Allergies, Isolation Precautions Code Status Info: Full Allergies Info: Influenza Virus Vaccine     Isolation Precautions Info: COVID+     Current Medications (05/24/2021):  This is the current hospital active medication list Current Facility-Administered Medications  Medication Dose Route Frequency Provider Last Rate Last Admin   acetaminophen (TYLENOL) tablet 650 mg  650 mg Oral Q6H PRN Wynetta Fines T, MD       amLODipine (NORVASC) tablet 10 mg  10 mg Oral Daily Thurnell Lose, MD   10 mg at 05/24/21 1028   aspirin EC tablet 81 mg  81 mg Oral Daily Wynetta Fines T, MD   81 mg at 05/24/21 1027   atorvastatin (LIPITOR) tablet 40 mg  40 mg Oral q1800 Wynetta Fines T, MD   40 mg at 05/23/21 1745   Chlorhexidine Gluconate Cloth 2 % PADS 6 each  6 each Topical Q0600 Jonetta Osgood, MD   6 each at 05/24/21 0536   [START ON 05/25/2021] Chlorhexidine Gluconate Cloth 2 % PADS 6 each  6 each Topical Q0600 Penninger, Ria Comment, PA       cinacalcet (SENSIPAR) tablet 90 mg  90 mg Oral Q M,W,F-HD Roney Jaffe, MD       dextrose 10 % infusion   Intravenous Continuous Jonetta Osgood, MD 40 mL/hr at 05/23/21 2156 Infusion Verify at 05/23/21 2156   dextrose 50 % solution 50 mL  1 ampule Intravenous PRN Jonetta Osgood, MD   50 mL at 05/22/21 2210   dorzolamide-timolol (COSOPT) 22.3-6.8 MG/ML ophthalmic solution 1 drop  1 drop Both Eyes BID Wynetta Fines T, MD   1 drop at 05/24/21 1029   gabapentin (NEURONTIN) capsule 300 mg  300 mg Oral QPM Wynetta Fines T, MD   300 mg at 05/23/21 1745   heparin injection 5,000 Units  5,000 Units Subcutaneous Q12H Wynetta Fines T, MD   5,000 Units at 05/24/21 1028   hydrALAZINE (APRESOLINE) injection 10 mg  10 mg Intravenous Q6H PRN Jonetta Osgood, MD   10 mg at 05/21/21 1528   hydrALAZINE (APRESOLINE) tablet 25 mg  25 mg Oral Q8H  Singh, Prashant K, MD       latanoprost (XALATAN) 0.005 % ophthalmic solution 1 drop  1 drop Both Eyes QHS Wynetta Fines T, MD   1 drop at 05/23/21 2025   ondansetron (ZOFRAN) injection 4 mg  4 mg Intravenous Q6H PRN Wynetta Fines T, MD       pantoprazole (PROTONIX) EC tablet 40 mg  40 mg Oral Daily Wynetta Fines T, MD   40 mg at 05/24/21 1027   predniSONE (DELTASONE) tablet 15 mg  15 mg Oral BID WC Thurnell Lose, MD       remdesivir 100 mg in sodium chloride 0.9 % 100 mL IVPB  100 mg Intravenous Daily Bertis Ruddy, RPH 200 mL/hr at 05/24/21 1102 100 mg at 05/24/21 1102   sevelamer carbonate (RENVELA) tablet 1,600 mg  1,600 mg  Oral TID WC Wynetta Fines T, MD   1,600 mg at 05/24/21 1027   vitamin B-12 (CYANOCOBALAMIN) tablet 500 mcg  500 mcg Oral Daily Lequita Halt, MD   500 mcg at 05/24/21 1026     Discharge Medications: Please see discharge summary for a list of discharge medications.  Relevant Imaging Results:  Relevant Lab Results:   Additional Information SSN# [id number] [facility]  MWF pt COVID+ onset 9/7  Bary Castilla, LCSW

## 2021-05-24 NOTE — TOC Initial Note (Addendum)
Transition of Care [facility]) - Initial/Assessment Note    Patient Details  Name: Travis [name] MRN: [id number] Date of Birth: 1941/07/02  [facility]) CM/SW Contact:    Bary Castilla, LCSW Phone Number:[phone] 05/24/2021, 12:03 PM  Clinical Narrative:                  CSW initially spoke with pt's wife Burman Nieves due to pt's fluctuating orientation. CSW explained the recommendation of a SNF and she explained that pt had mentioned it to her. [name] was in agreement with SNF and wanted a facility near [place]. CSW inquired about pt's vaccination status and pt has had both vaccines however she does not recall any boosters. CSW asked about pt's HD schedule and she confirmed that pt is at [facility] however does not remember time.  CSW called pt's room and was unable to reach him. CSW called back later and pt's wife answered the phone.She confirmed with pt that he was willing to go to a SNF.  Pt is COVID+ and on HD therefore SNF options will be limited until pt is out of the 10 day precautionary period.  [facility] does not have COVID+ beds.  Medicare.gov rating list was provided to pt's wife.  TOC team will continue to assist with discharge planning needs.   Expected Discharge Plan: Skilled Nursing Facility Barriers to Discharge: SNF Pending bed offer, Continued Medical Work up, Ship broker   Patient Goals and CMS Choice   CMS Medicare.gov Compare Post Acute Care list provided to:: Patient Represenative (must comment) (Spouse)    Expected Discharge Plan and Services Expected Discharge Plan: [place] arrangements for the past 2 months: Single Family Home                                      Prior Living Arrangements/Services Living arrangements for the past 2 months: Single Family Home Lives with:: Self, Spouse            Care giver support system in place?: Yes (comment)      Activities of Daily  Living Home Assistive Devices/Equipment: Blood pressure cuff, Cane (specify quad or straight), CBG Meter, Eyeglasses, Wheelchair, Environmental consultant (specify type) ADL Screening (condition at time of admission) Patient's cognitive ability adequate to safely complete daily activities?: Yes Is the patient deaf or have difficulty hearing?: No Does the patient have difficulty seeing, even when wearing glasses/contacts?: No Does the patient have difficulty concentrating, remembering, or making decisions?: No Patient able to express need for assistance with ADLs?: No Does the patient have difficulty dressing or bathing?: No Independently performs ADLs?: No Communication: Independent Dressing (OT): Needs assistance Is this a change from baseline?: Change from baseline, expected to last >3 days Grooming: Needs assistance Is this a change from baseline?: Change from baseline, expected to last >3 days Feeding: Independent Bathing: Needs assistance Is this a change from baseline?: Change from baseline, expected to last >3 days Toileting: Needs assistance Is this a change from baseline?: Change from baseline, expected to last <3 days In/Out Bed: Needs assistance Is this a change from baseline?: Change from baseline, expected to last >3 days Walks in Home: Independent with device (comment) (walker, cane) Does the patient have difficulty walking or climbing stairs?: Yes Weakness of Legs: Both Weakness of Arms/Hands: Both  Permission Sought/Granted      Share  Information with NAME: Spouse-[name]  Permission granted to share info w AGENCY: SNFs  Permission granted to share info w Relationship: Spouse  Permission granted to share info w Contact Information: [id number]  Emotional Assessment Appearance:: Other (Comment Required (Unable to access) Attitude/Demeanor/Rapport: Unable to Assess Affect (typically observed): Unable to Assess Orientation: : Fluctuating Orientation (Suspected and/or reported  Sundowners), Oriented to Self      Admission diagnosis:  Hypoglycemia [E16.2] Fever [R50.9] Altered mental status, unspecified altered mental status type [R41.82] Patient Active Problem List   Diagnosis Date Noted   Hypoglycemia 05/20/2021   Altered mental status    Allergy, unspecified, initial encounter 07/02/2019   Other fluid overload 04/04/2019   Closed fracture of left hip with routine healing 12/23/2016   Neuropathic pain 06/22/2016   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   Pericardial effusion 0000000   Chronic systolic CHF (congestive heart failure), NYHA class 2 ([place]) 11/05/2015   Sepsis ([place]) 11/05/2015   SIRS (systemic inflammatory response syndrome) ([place]) 11/05/2015   Chest pain on breathing    Thrombocytopenia ([place]) 08/11/2015   FTT (failure to thrive) in adult 08/11/2015   Cough    Rectal pain 07/28/2015   Fecal impaction ([place]) 07/28/2015   Rectal bleeding 07/28/2015   Chronic respiratory failure ([place]) 07/28/2015   Lower gastrointestinal bleed    Pulmonary edema 05/14/2015   Hypotension of hemodialysis [id number]   Acute systolic CHF (congestive heart failure) ([place]) 04/25/2015   Protein-calorie malnutrition, severe ([place]) 04/19/2015   NSTEMI (non-ST elevated myocardial infarction) ([place]) 04/18/2015   Other abnormal glucose 02/21/2015   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   Iron deficiency anemia, unspecified 02/20/2015   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   Pruritus, unspecified 02/20/2015   Secondary hyperparathyroidism of renal origin ([place]) 02/20/2015   ESRD on dialysis [facility])    Acute renal failure syndrome ([place])    AKI (acute kidney injury) ([place])    Palliative care encounter    Acute on chronic renal failure ([place]) 01/31/2015   Acute DVT of right tibial vein ([place]) 01/29/2015   Acute kidney injury ([place]) 01/29/2015   DVT (deep venous thrombosis) ([place]) 01/29/2015   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   Acute on chronic respiratory failure with hypoxia ([place]) 01/28/2015   Physical deconditioning 01/28/2015   Sjogren's disease ([place]) 01/28/2015   Pedal edema 01/28/2015   Peripheral edema 01/07/2015   HCAP (healthcare-associated pneumonia) 01/06/2015   Steroid-induced hyperglycemia 01/06/2015   Dyspnea    Pulmonary fibrosis (HCC)    Knee pain    Shoulder pain    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Phone: [phone]: [phone]    Social Determinants of Health (SDOH) Interventions    Readmission Risk Interventions No flowsheet data found.

## 2021-05-24 NOTE — Progress Notes (Signed)
PROGRESS NOTE                                                                                                                                                                                                             Patient Demographics:    Travis [name], is a 80 y.o. male, DOB - 05-26-1941, [id number]  Outpatient Primary MD for the patient is [name], [place], MD   Admit date - 05/20/2021   LOS - 4  Chief Complaint  Patient presents with   Hypoglycemia   Fall       Brief Narrative: Patient is a 80 y.o. 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  Patient was found to be COVID-19 positive-he was subsequently admitted to the hospitalist service.  COVID-19 vaccinated status: Vaccinated  Significant Events: 9/7>> Admit to [facility] for fever/AMS/hypoglycemia/COVID-19 infection  Significant studies: 9/7>> CT head: No acute intracranial abnormalities 9/7>> CT C-spine: No fracture/subluxation 9/7>> CT chest: No pneumonia 9/8>> CT abdomen/pelvis: Cholelithiasis, moderate prostate enlargement. 9/8>> C-peptide/random insulin levels: Elevated 9/8>> Sulfonylureas levels/proinsulin/insulin ratio: Pending  COVID-19 medications: Remdesivir: 9/8>>  Antibiotics: Vancomycin: 9/7 x 1 Rocephin: 9/7 x 1  Microbiology data: 9/7>> COVID PCR: Positive (CT value 28.7) 9/7 >>blood culture: No growth  Procedures: None  Consults: Nephrology  DVT prophylaxis: heparin injection 5,000 Units Start: 05/20/21 2200    Subjective:   Patient in bed, appears comfortable, denies any headache, no fever, no chest pain or pressure, no shortness of breath , no abdominal pain. No new focal weakness.    Assessment  & Plan :   Sepsis: Sepsis  physiology has resolved-etiology felt to be COVID-19 infection.  Continue Remdesivir.   COVID-19 infection: Not hypoxic-no PNA on imaging-continue Remdesivir x3 days total.    Recent Labs    05/22/21 0151  DDIMER 3.96*  CRP 1.5*   Acute metabolic encephalopathy: Due to hypoglycemia-encephalopathy has resolved-patient completely awake and alert this morning.    Hypoglycemia: Unclear etiology-improving-decrease D10 to 40 cc/hour-encourage oral intake-if CBGs continue to be stable-suspect can decrease further.  C-peptide/random insulin levels were elevated-awaiting sulfonylurea level.  However he has developed persistent hypoglycemia which seems to be recurrent, have placed him on  steroids, CT abdomen pelvis with IV Contrast non acute, CBGs stable on steroids, will have him do outpatient endocrine follow-up.  We will also educate him on CBG testing today, will be provided with testing supplies and likely discharged on 05/25/2021.  CBG (last 3)  Recent Labs    05/24/21 0520 05/24/21 0832 05/24/21 1108  GLUCAP 138* 138* 92      ESRD: Have consulted nephrology for HD  HLD: Statin  History of pulmonary fibrosis-on chronic hypoxic respiratory failure-on 2 L of oxygen at home: Seems stable    GI prophylaxis: PPI  Condition - Stable  Family Communication  :  Spouse-[name]- [phone]-called-unable to leave voicemail.  Code Status :  Full Code  Diet :  Diet Order             Diet Heart Room service appropriate? Yes; Fluid consistency: Thin; Fluid restriction: 1500 mL Fluid  Diet effective now                    Disposition Plan  :   Status is: Inpatient  Remains inpatient appropriate because:Inpatient level of care appropriate due to severity of illness  Dispo: The patient is from: Home              Anticipated d/c is to: Home              Patient currently is not medically stable to d/c.   Difficult to place patient No    Barriers to discharge: Resolving sepsis  physiology-resolving hypoglycemia still on D10 infusion.  Antimicorbials  :    Anti-infectives (From admission, onward)    Start     Dose/Rate Route Frequency Ordered Stop   05/22/21 1200  vancomycin (VANCOREADY) IVPB 750 mg/150 mL  Status:  Discontinued        750 mg 150 mL/hr over 60 Minutes Intravenous Every M-W-F (Hemodialysis) 05/21/21 0737 05/21/21 1134   05/22/21 1000  remdesivir 100 mg in sodium chloride 0.9 % 100 mL IVPB       See Hyperspace for full Linked Orders Report.   100 mg 200 mL/hr over 30 Minutes Intravenous Daily 05/21/21 1134 05/26/21 0959   05/21/21 1400  remdesivir 200 mg in sodium chloride 0.9% 250 mL IVPB       See Hyperspace for full Linked Orders Report.   200 mg 580 mL/hr over 30 Minutes Intravenous Once 05/21/21 1134 05/21/21 1457   05/20/21 2059  vancomycin variable dose per unstable renal function (pharmacist dosing)  Status:  Discontinued         Does not apply See admin instructions 05/20/21 2059 05/21/21 0737   05/20/21 1545  vancomycin (VANCOREADY) IVPB 1500 mg/300 mL        1,500 mg 150 mL/hr over 120 Minutes Intravenous  Once 05/20/21 1546 05/20/21 1925   05/20/21 1245  cefTRIAXone (ROCEPHIN) 2 g in sodium chloride 0.9 % 100 mL IVPB  Status:  Discontinued        2 g 200 mL/hr over 30 Minutes Intravenous Every 24 hours 05/20/21 1232 05/21/21 1122   05/20/21 0000  metroNIDAZOLE (FLAGYL) 500 MG/100ML        500 mg Intravenous  Once 05/20/21 1521 05/20/21 2359       Inpatient Medications  Scheduled Meds:  amLODipine  10 mg Oral Daily   aspirin EC  81 mg Oral Daily   atorvastatin  40 mg Oral q1800   Chlorhexidine Gluconate Cloth  6 each Topical Q0600   cinacalcet  90 mg Oral Q M,W,F-HD   dorzolamide-timolol  1 drop Both Eyes BID   gabapentin  300 mg Oral QPM   heparin  5,000 Units Subcutaneous Q12H   hydrALAZINE  25 mg Oral Q8H   latanoprost  1 drop Both Eyes QHS   pantoprazole  40 mg Oral Daily   predniSONE  15 mg Oral BID WC   sevelamer  carbonate  1,600 mg Oral TID WC   vitamin B-12  500 mcg Oral Daily   Continuous Infusions:  dextrose 40 mL/hr at 05/23/21 2156   remdesivir 100 mg in NS 100 mL 100 mg (05/24/21 1102)   PRN Meds:.acetaminophen **OR** [DISCONTINUED] acetaminophen, dextrose, hydrALAZINE, [DISCONTINUED] ondansetron **OR** ondansetron (ZOFRAN) IV   Time Spent in minutes  25    See all Orders from today for further details   Lala Lund M.D on 05/24/2021 at 11:29 AM  To page go to [email or web address] - use universal password  [facility] -  Office  [phone]    Objective:   Vitals:   05/24/21 0028 05/24/21 0518 05/24/21 0828 05/24/21 1104  BP: (!) 156/72 (!) 155/64 (!) 163/65 (!) 167/75  Pulse: 61 60 60 60  Resp: '14 18 17 12  '$ Temp: 98.4 F (36.9 C) 98.5 F (36.9 C) 97.9 F (36.6 C) 98.3 F (36.8 C)  TempSrc: Oral Oral Oral Oral  SpO2: 100% 100% 100% 100%  Weight:      Height:        Wt Readings from Last 3 Encounters:  05/22/21 76.5 kg  03/10/21 75 kg  12/09/20 74 kg     Intake/Output Summary (Last 24 hours) at 05/24/2021 1129 Last data filed at 05/23/2021 2156 Gross per 24 hour  Intake 559.2 ml  Output --  Net 559.2 ml     Physical Exam  Awake Alert, No new F.N deficits, Normal affect [place].AT,PERRAL Supple Neck,No JVD, No cervical lymphadenopathy appriciated.  Symmetrical Chest wall movement, Good air movement bilaterally, CTAB RRR,No Gallops, Rubs or new Murmurs, No Parasternal Heave +ve B.Sounds, Abd Soft, No tenderness, No organomegaly appriciated, No rebound - guarding or rigidity. No Cyanosis, Clubbing or edema, No new Rash or bruise     Data Review:    CBC Recent Labs  Lab 05/20/21 1310 05/20/21 1341 05/21/21 0427 05/22/21 0151 05/23/21 0114  WBC 7.5  --  5.3 8.0 6.5  HGB 13.2 13.9 12.0* 12.5* 11.5*  HCT 44.0 41.0 40.2 40.4 36.4*  PLT 147*  --  104* 95* 112*  MCV 102.1*  --  102.3* 98.3 96.8  MCH 30.6  --  30.5 30.4 30.6  MCHC 30.0  --  29.9*  30.9 31.6  RDW 16.6*  --  16.6* 16.4* 16.2*  LYMPHSABS 0.6*  --   --   --   --   MONOABS 1.3*  --   --   --   --   EOSABS 0.1  --   --   --   --   BASOSABS 0.0  --   --   --   --     Chemistries  Recent Labs  Lab 05/20/21 1310 05/20/21 1341 05/21/21 0427 05/21/21 1537 05/21/21 1744 05/22/21 0151 05/23/21 0114  NA 138 136 135  --   --  128* 131*  K 4.5 6.6* 4.3  --   --  4.9 4.3  CL 92*  --  88*  --   --  85* 93*  CO2 30  --  30  --   --  26 26  GLUCOSE 162*  --  99 111* 63* 74 89  BUN 22  --  27*  --   --  32* 20  CREATININE 8.37*  --  9.59*  --   --  10.91* 7.12*  CALCIUM 9.4  --  9.0  --   --  8.9 8.9  MG  --   --  2.2  --   --   --   --   AST 17  --   --   --   --   --   --   ALT 10  --   --   --   --   --   --   ALKPHOS 81  --   --   --   --   --   --   BILITOT 0.6  --   --   --   --   --   --    ------------------------------------------------------------------------------------------------------------------ No results for input(s): CHOL, HDL, LDLCALC, TRIG, CHOLHDL, LDLDIRECT in the last 72 hours.  Lab Results  Component Value Date   HGBA1C 4.7 (L) 05/21/2021   ------------------------------------------------------------------------------------------------------------------ No results for input(s): TSH, T4TOTAL, T3FREE, THYROIDAB in the last 72 hours.  Invalid input(s): FREET3 ------------------------------------------------------------------------------------------------------------------ No results for input(s): VITAMINB12, FOLATE, FERRITIN, TIBC, IRON, RETICCTPCT in the last 72 hours.   Coagulation profile No results for input(s): INR, PROTIME in the last 168 hours.  Recent Labs    05/22/21 0151  DDIMER 3.96*    Cardiac Enzymes No results for input(s): CKMB, TROPONINI, MYOGLOBIN in the last 168 hours.  Invalid input(s): CK ------------------------------------------------------------------------------------------------------------------     Component Value Date/Time   BNP 502.4 (H) 05/21/2021 0427     Radiology Reports CT HEAD WO CONTRAST (5MM)  Result Date: 05/20/2021 CLINICAL DATA:  Status post fall. In stage renal disease on hemodialysis. History of Sjogren syndrome. EXAM: CT HEAD WITHOUT CONTRAST CT CERVICAL SPINE WITHOUT CONTRAST TECHNIQUE: Multidetector CT imaging of the head and cervical spine was performed following the standard protocol without intravenous contrast. Multiplanar CT image reconstructions of the cervical spine were also generated. COMPARISON:  CT head 04/22/2015.  Chest CT 11/06/2015. FINDINGS: CT HEAD FINDINGS Brain: There is no evidence of acute intracranial hemorrhage, mass lesion, brain edema or extra-axial fluid collection. Mildly progressive atrophy with prominence of the ventricles and subarachnoid spaces there is confluent low-density in the periventricular white matter which has progressed, likely due to progressive chronic small vessel ischemic changes. There is no CT evidence of acute cortical infarction. Vascular: Prominent intracranial vascular calcifications. No hyperdense vessel identified. Skull: Negative for fracture or focal lesion. Sinuses/Orbits: The visualized paranasal sinuses and mastoid air cells are clear. No orbital abnormalities are seen. Other: None. CT CERVICAL SPINE FINDINGS Alignment: Normal Skull base and vertebrae: No evidence of acute fracture or traumatic subluxation. There are scattered tiny lucent lesions throughout the vertebral bodies which may relate to chronic renal failure/hyperparathyroidism. Appearance is similar to prior chest CT, making a neoplastic process unlikely. Soft tissues and spinal canal: No prevertebral fluid or swelling. No visible canal hematoma. Disc levels: Multilevel cervical spondylosis with loss of disc height, uncinate spurring and facet hypertrophy. Resulting mild foraminal narrowing at multiple levels. No large disc herniation identified. Upper chest:  Unremarkable. Other: Mild bilateral carotid atherosclerosis. IMPRESSION: 1. No acute intracranial or calvarial findings. 2. Mildly progressive atrophy and chronic small vessel ischemic changes. 3. No evidence of acute cervical spine fracture, traumatic subluxation or static signs of instability. 4. Mild  multilevel spondylosis. Electronically Signed   By: Richardean Sale M.D.   On: 05/20/2021 13:36   CT Cervical Spine Wo Contrast  Result Date: 05/20/2021 CLINICAL DATA:  Status post fall. In stage renal disease on hemodialysis. History of Sjogren syndrome. EXAM: CT HEAD WITHOUT CONTRAST CT CERVICAL SPINE WITHOUT CONTRAST TECHNIQUE: Multidetector CT imaging of the head and cervical spine was performed following the standard protocol without intravenous contrast. Multiplanar CT image reconstructions of the cervical spine were also generated. COMPARISON:  CT head 04/22/2015.  Chest CT 11/06/2015. FINDINGS: CT HEAD FINDINGS Brain: There is no evidence of acute intracranial hemorrhage, mass lesion, brain edema or extra-axial fluid collection. Mildly progressive atrophy with prominence of the ventricles and subarachnoid spaces there is confluent low-density in the periventricular white matter which has progressed, likely due to progressive chronic small vessel ischemic changes. There is no CT evidence of acute cortical infarction. Vascular: Prominent intracranial vascular calcifications. No hyperdense vessel identified. Skull: Negative for fracture or focal lesion. Sinuses/Orbits: The visualized paranasal sinuses and mastoid air cells are clear. No orbital abnormalities are seen. Other: None. CT CERVICAL SPINE FINDINGS Alignment: Normal Skull base and vertebrae: No evidence of acute fracture or traumatic subluxation. There are scattered tiny lucent lesions throughout the vertebral bodies which may relate to chronic renal failure/hyperparathyroidism. Appearance is similar to prior chest CT, making a neoplastic process  unlikely. Soft tissues and spinal canal: No prevertebral fluid or swelling. No visible canal hematoma. Disc levels: Multilevel cervical spondylosis with loss of disc height, uncinate spurring and facet hypertrophy. Resulting mild foraminal narrowing at multiple levels. No large disc herniation identified. Upper chest: Unremarkable. Other: Mild bilateral carotid atherosclerosis. IMPRESSION: 1. No acute intracranial or calvarial findings. 2. Mildly progressive atrophy and chronic small vessel ischemic changes. 3. No evidence of acute cervical spine fracture, traumatic subluxation or static signs of instability. 4. Mild multilevel spondylosis. Electronically Signed   By: Richardean Sale M.D.   On: 05/20/2021 13:36   CT ABDOMEN PELVIS W CONTRAST  Result Date: 05/23/2021 CLINICAL DATA:  80 year old male with a history of pancreatic cancer and abdominal pain with nausea vomiting EXAM: CT ABDOMEN AND PELVIS WITH CONTRAST TECHNIQUE: Multidetector CT imaging of the abdomen and pelvis was performed using the standard protocol following bolus administration of intravenous contrast. CONTRAST:  29m OMNIPAQUE IOHEXOL 300 MG/ML  SOLN COMPARISON:  CT 05/20/2021, 07/28/2015, chest CT 11/06/2015 FINDINGS: Lower chest: No pleural effusion. Atelectatic changes. Nodule at the left lung base measures 6 mm. This nodule has grown from the prior CT dated 11/06/2015. Hepatobiliary: Unremarkable appearance of the liver. Radiopaque gallstones within the gallbladder lumen. No pericholecystic inflammatory changes. Unremarkable appearance of the common bile duct. Pancreas: Unremarkable Spleen: Unremarkable Adrenals/Urinary Tract: - Right adrenal gland:  Unremarkable - Left adrenal gland: Unremarkable. - Right kidney: No hydronephrosis. No nephrolithiasis. Compared to remote CTs there is renal cortical thinning, with mild perirenal inflammatory changes symmetric to the left. Developing cystic change. - Left Kidney: No hydronephrosis or  nephrolithiasis. Symmetric to the right there is renal cortical thinning compared to prior CT scans, with mild perirenal inflammatory changes and developing cystic change. - Urinary Bladder: Urinary bladder decompressed. Stomach/Bowel: - Stomach: Hiatal hernia.  Stomach unremarkable. - Small bowel: Small bowel unremarkable with no distention. No focal wall thickening. - Appendix: Normal. - Colon: Colon is without abnormal distention. Mild-to-moderate stool burden. Colonic diverticular disease. No focal wall thickening or inflammatory changes. Vascular/Lymphatic: Atherosclerosis of the abdominal aorta. No aneurysm. No dissection. No periaortic inflammatory changes. Mild  atherosclerosis at the renal artery origin and the mesenteric artery origin without evidence of occlusion or high-grade stenosis. Bilateral iliac and proximal femoral arteries are patent. No mesenteric or retroperitoneal adenopathy. No inguinal adenopathy. No pelvic adenopathy. IVC filter in place. Reproductive: Prostate measures 4.8 cm. Other: Fat containing left inguinal hernia. Musculoskeletal: Degenerative changes of the spine. No acute displaced fracture. No bony canal narrowing. IMPRESSION: The given history is of pancreatic carcinoma. I see no evidence of pancreatic carcinoma on the current CT, and would advise correlation with the patient's history and potentially further MRI imaging if there is ongoing concern. Developing cystic change of the bilateral kidneys and associated renal cortical thinning, which is most likely chronic renal disease and cystic change of chronic renal failure given the patient's history of dialysis. Strictly speaking, ascending urinary tract infection can not be excluded by CT. Cholelithiasis without evidence acute inflammatory changes. 6 mm nodule at the left lung base. Non-contrast chest CT at 6-12 months is recommended. If the nodule is stable at time of repeat CT, then future CT at 18-24 months (from today's scan)  is considered optional for low-risk patients, but is recommended for high-risk patients. This recommendation follows the consensus statement: Guidelines for Management of Incidental Pulmonary Nodules Detected on CT Images: From the Fleischner Society 2017; Radiology 2017; 284:228-243. Additional ancillary findings as above. Electronically Signed   By: Corrie Mckusick D.O.   On: 05/23/2021 12:12   DG Chest Port 1 View  Result Date: 05/20/2021 CLINICAL DATA:  Questionable sepsis EXAM: PORTABLE CHEST 1 VIEW COMPARISON:  Chest radiograph 10/16/2020 FINDINGS: The heart is at the upper limits of normal for size, increased compared to the prior chest radiograph. The mediastinal contours are within normal limits. There is calcified atherosclerotic plaque of the aortic arch. A right subclavian vascular stent is again noted. There is no focal consolidation or pulmonary edema. There is no pleural effusion or pneumothorax. There is no acute osseous abnormality. IMPRESSION: Increased size of the cardiac silhouette compared to the study from 10/16/2020. Given the patient has had pericardial effusions in the past, consider correlation with ECHO. Electronically Signed   By: Valetta Mole M.D.   On: 05/20/2021 13:39   CT CHEST ABDOMEN PELVIS WO CONTRAST  Result Date: 05/20/2021 CLINICAL DATA:  Fever. EXAM: CT CHEST, ABDOMEN AND PELVIS WITHOUT CONTRAST TECHNIQUE: Multidetector CT imaging of the chest, abdomen and pelvis was performed following the standard protocol without IV contrast. COMPARISON:  October 17, 2015. FINDINGS: CT CHEST FINDINGS Cardiovascular: Atherosclerosis of thoracic aorta is noted without aneurysm formation. Normal cardiac size. No pericardial effusion. Coronary artery calcifications are noted. Mediastinum/Nodes: No enlarged mediastinal, hilar, or axillary lymph nodes. Thyroid gland, trachea, and esophagus demonstrate no significant findings. Lungs/Pleura: No pneumothorax or pleural effusion is noted. Minimal  bibasilar subsegmental atelectasis or scarring is noted. Musculoskeletal: No chest wall mass or suspicious bone lesions identified. CT ABDOMEN PELVIS FINDINGS Hepatobiliary: Cholelithiasis is noted. No biliary dilatation is noted. The liver is unremarkable. Pancreas: Unremarkable. No pancreatic ductal dilatation or surrounding inflammatory changes. Spleen: Normal in size without focal abnormality. Adrenals/Urinary Tract: Adrenal glands appear normal. Multiple small bilateral renal cysts are noted. No hydronephrosis or renal obstruction is noted. No renal or ureteral calculi are noted. Urinary bladder is unremarkable. Stomach/Bowel: The stomach appears normal. There is no evidence of bowel obstruction or inflammation. Vascular/Lymphatic: Aortic atherosclerosis. No enlarged abdominal or pelvic lymph nodes. IVC filter is in infrarenal position. Reproductive: Moderate prostatic enlargement is noted. Other: No abdominal wall hernia  or abnormality. No abdominopelvic ascites. Musculoskeletal: No acute or significant osseous findings. IMPRESSION: Cholelithiasis. Coronary artery calcifications are noted suggesting coronary artery disease. Multiple bilateral renal cysts are noted. Moderate prostatic enlargement. No acute abnormality seen in the chest, abdomen or pelvis. Aortic Atherosclerosis (ICD10-I70.0). Electronically Signed   By: Marijo Conception M.D.   On: 05/20/2021 16:02

## 2021-05-25 LAB — CBC
HCT: 34 % — ABNORMAL LOW (ref 39.0–52.0)
Hemoglobin: 11 g/dL — ABNORMAL LOW (ref 13.0–17.0)
MCH: 30.2 pg (ref 26.0–34.0)
MCHC: 32.4 g/dL (ref 30.0–36.0)
MCV: 93.4 fL (ref 80.0–100.0)
Platelets: 156 10*3/uL (ref 150–400)
RBC: 3.64 MIL/uL — ABNORMAL LOW (ref 4.22–5.81)
RDW: 15.8 % — ABNORMAL HIGH (ref 11.5–15.5)
WBC: 6.3 10*3/uL (ref 4.0–10.5)
nRBC: 0 % (ref 0.0–0.2)

## 2021-05-25 LAB — GLUCOSE, CAPILLARY
Glucose-Capillary: 118 mg/dL — ABNORMAL HIGH (ref 70–99)
Glucose-Capillary: 120 mg/dL — ABNORMAL HIGH (ref 70–99)
Glucose-Capillary: 100 mg/dL — ABNORMAL HIGH (ref 70–99)
Glucose-Capillary: 126 mg/dL — ABNORMAL HIGH (ref 70–99)
Glucose-Capillary: 127 mg/dL — ABNORMAL HIGH (ref 70–99)

## 2021-05-25 LAB — RENAL FUNCTION PANEL
Albumin: 3 g/dL — ABNORMAL LOW (ref 3.5–5.0)
Anion gap: 14 (ref 5–15)
BUN: 57 mg/dL — ABNORMAL HIGH (ref 8–23)
CO2: 23 mmol/L (ref 22–32)
Calcium: 8.4 mg/dL — ABNORMAL LOW (ref 8.9–10.3)
Chloride: 87 mmol/L — ABNORMAL LOW (ref 98–111)
Creatinine, Ser: 11.69 mg/dL — ABNORMAL HIGH (ref 0.61–1.24)
GFR, Estimated: 4 mL/min — ABNORMAL LOW (ref >60)
Glucose, Bld: 119 mg/dL — ABNORMAL HIGH (ref 70–99)
Phosphorus: 7.2 mg/dL — ABNORMAL HIGH (ref 2.5–4.6)
Potassium: 5.6 mmol/L — ABNORMAL HIGH (ref 3.5–5.1)
Sodium: 124 mmol/L — ABNORMAL LOW (ref 135–145)

## 2021-05-25 LAB — CULTURE, BLOOD (ROUTINE X 2): Culture: NO GROWTH

## 2021-05-25 MED ORDER — HEPARIN SODIUM (PORCINE) 1000 UNIT/ML DIALYSIS: 1000.0000 [IU] | INTRAMUSCULAR | Status: DC | PRN

## 2021-05-25 MED ORDER — SODIUM CHLORIDE 0.9 % IV SOLN: 100.0000 mL | INTRAVENOUS | Status: DC | PRN

## 2021-05-25 MED ORDER — PENTAFLUOROPROP-TETRAFLUOROETH EX AERO: 1.0000 "application " | INHALATION_SPRAY | CUTANEOUS | Status: DC | PRN

## 2021-05-25 MED ORDER — LIDOCAINE HCL (PF) 1 % IJ SOLN: 5.0000 mL | INTRAMUSCULAR | Status: DC | PRN

## 2021-05-25 MED ORDER — LIDOCAINE-PRILOCAINE 2.5-2.5 % EX CREA: 1.0000 "application " | TOPICAL_CREAM | CUTANEOUS | Status: DC | PRN

## 2021-05-25 MED ORDER — ALTEPLASE 2 MG IJ SOLR: 2.0000 mg | Freq: Once | INTRAMUSCULAR | Status: DC | PRN

## 2021-05-25 NOTE — Progress Notes (Signed)
Physical Therapy Treatment Patient Details Name: Travis [name] MRN: [id number] DOB: 1941/06/04 Today's Date: 05/25/2021   History of Present Illness 80 y.o. male presents to [facility] ED on 05/20/2021 from dialysis center with hypoglycemia, fatigue, and fever. Pt found to be COVID-19+. PMH includes ESRD on HD, pulmonary fibrosis with chronic hypoxic respiratory failure on 2 L of oxygen at home, Sjogren's syndrome, peripheral neuropathy.    PT Comments    The pt was seen for continued progression of OOB mobility and education this afternoon. The pt presents with improved alertness and ability to follow simple commands this session, but continues to need up to minA to complete sit-stand transfers and ambulation in the room. The pt's daughter and wife were present, educated on amount of assist the pt currently needs, and feel comfortable with being able to provide needed assist. The pt was able to complete multiple loops of ambulation in the room with VSS, no overt LOB, and minG to minA with use of a RW which is a significant improvement from initial evaluation. After discussion with the daughter, she will be able to stay with the pt and his wife to provide 24/7 supervision and assist for all mobility and ADLs, and feels comfortable with the amount of assist the pt currently needs. The family is able to provide transportation to [place], and would like to speak to [facility] about possible locations near their home. Given the pt's improvements and level of assist available from the pt's daughter, I feel he will be safe to progress to home, but will benefit from OPPT to progress endurance and balance to further reduce risk of falls and fall-related injuries after d/c.       Recommendations for follow up therapy are one component of a multi-disciplinary discharge planning process, led by the attending physician.  Recommendations may be updated based on patient status, additional functional criteria and insurance  authorization.  Follow Up Recommendations  Outpatient PT;Supervision for mobility/OOB     Equipment Recommendations  3in1 (PT)    Recommendations for Other Services       Precautions / Restrictions Precautions Precautions: Fall Restrictions Weight Bearing Restrictions: No     Mobility  Bed Mobility Overal bed mobility: Needs Assistance Bed Mobility: Supine to Sit;Sit to Supine     Supine to sit: Min guard Sit to supine: Min assist   General bed mobility comments: Increased time and effort to rise into sitting ,  Mod cues for hand placement. Min assistance to return back to bed.    Transfers Overall transfer level: Needs assistance Equipment used: Rolling walker (2 wheeled) Transfers: Sit to/from Stand Sit to Stand: Min guard;Min assist         General transfer comment: minA from bed at lowest setting, pt with significantly increased time and effort, but able to complete with very light minA to facilitate forward wt shift. discussed level of assist needed with daughter who is present  Ambulation/Gait Ambulation/Gait assistance: Min guard Gait Distance (Feet): 45 Feet Assistive device: Rolling walker (2 wheeled) Gait Pattern/deviations: Step-through pattern;Decreased stride length;Trunk flexed Gait velocity: decreased Gait velocity interpretation: <1.31 ft/sec, indicative of household ambulator General Gait Details: pt with narrow BOS and trunk flexed, but no overt LOB and pt with slightly improved pace since last session. pt and his daughter report this is close to his baseline speed, and he is reliant on RW at baseline     Balance Overall balance assessment: Needs assistance;History of Falls Sitting-balance support: Feet supported;No upper extremity supported  Sitting balance-Leahy Scale: Fair Sitting balance - Comments: minG for safety, pt able to manage without BUE support   Standing balance support: Bilateral upper extremity supported;During functional  activity Standing balance-Leahy Scale: Poor Standing balance comment: reliant on UE support + external assist in standing                            Cognition Arousal/Alertness: Awake/alert Behavior During Therapy: Flat affect Overall Cognitive Status: Within Functional Limits for tasks assessed                                 General Comments: pt able to follow all simple commands, but needs slightly increased time for processing at this time. the pt was able to maintain conversation, but needs cues for insight to safety with mobility at home      Exercises Other Exercises Other Exercises: sit-stand x3 in a row, pt reliant on use of BUE to power up, increased time and effort, but improved with reps    General Comments General comments (skin integrity, edema, etc.): VSS on RA      Pertinent Vitals/Pain Pain Assessment: No/denies pain     PT Goals (current goals can now be found in the care plan section) Acute Rehab PT Goals Patient Stated Goal: feel better, go home soon. see family PT Goal Formulation: With patient/family Time For Goal Achievement: 06/05/21 Potential to Achieve Goals: Good Progress towards PT goals: Progressing toward goals    Frequency    Min 3X/week      PT Plan Current plan remains appropriate       AM-PAC PT "6 Clicks" Mobility   Outcome Measure  Help needed turning from your back to your side while in a flat bed without using bedrails?: A Little Help needed moving from lying on your back to sitting on the side of a flat bed without using bedrails?: A Little Help needed moving to and from a bed to a chair (including a wheelchair)?: A Little Help needed standing up from a chair using your arms (e.g., wheelchair or bedside chair)?: A Little Help needed to walk in hospital room?: A Little Help needed climbing 3-5 steps with a railing? : A Lot 6 Click Score: 17    End of Session Equipment Utilized During Treatment:  Gait belt Activity Tolerance: Patient tolerated treatment well;Patient limited by fatigue Patient left: with call bell/phone within reach;with family/visitor present;in bed;with bed alarm set Nurse Communication: Mobility status PT Visit Diagnosis: Unsteadiness on feet (R26.81);Other abnormalities of gait and mobility (R26.89);Muscle weakness (generalized) (M62.81)     Time: 1655-1730 PT Time Calculation (min) (ACUTE ONLY): 35 min  Charges:  $Gait Training: 8-22 mins $Self Care/Home Management: 8-22                     West Carbo, PT, DPT   Acute Rehabilitation Department Pager #: [phone]   Sandra Cockayne 05/25/2021, 5:38 PM

## 2021-05-25 NOTE — Progress Notes (Signed)
PT Cancellation Note  Patient Details Name: Travis [name] MRN: [id number] DOB: 05/13/1941   Cancelled Treatment:    Reason Eval/Treat Not Completed: Patient at procedure or test/unavailable as he is off unit for HD this AM. PT will continue to follow and progress with POC as schedule allows.   West Carbo, PT, DPT   Acute Rehabilitation Department Pager #: [phone]   Sandra Cockayne 05/25/2021, 9:07 AM

## 2021-05-25 NOTE — Progress Notes (Signed)
Inpatient Diabetes Program Recommendations  AACE/ADA: New Consensus Statement on Inpatient Glycemic Control (2015)  Target Ranges:  Prepandial:   less than 140 mg/dL      Peak postprandial:   less than 180 mg/dL (1-2 hours)      Critically ill patients:  140 - 180 mg/dL   Lab Results  Component Value Date   GLUCAP 100 (H) 05/25/2021   HGBA1C 4.7 (L) 05/21/2021    Review of Glycemic Control  Diabetes history: No prior hx Current orders for Inpatient glycemic control: Prednisone 15 mg bid  Inpatient Diabetes Program Recommendations:   Received consult regarding teaching patient CBG testing. Attempted to contact patient via phone and mobile phone with no answer. Attached how to do CBGs to Exit Care notes.  Thank you, Nani Gasser. Lamaj Metoyer, RN, MSN, CDE  Diabetes Coordinator Inpatient Glycemic Control Team Team Pager [phone] (8am-5pm) 05/25/2021 2:00 PM

## 2021-05-25 NOTE — Progress Notes (Addendum)
PROGRESS NOTE                                                                                                                                                                                                             Patient Demographics:    Travis [name], is a 80 y.o. male, DOB - 1941/06/28, [id number]  Outpatient Primary MD for the patient is [name], [place], MD   Admit date - 05/20/2021   LOS - 5  Chief Complaint  Patient presents with   Hypoglycemia   Fall       Brief Narrative: Patient is a 80 y.o. 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  Patient was found to be COVID-19 positive-he was subsequently admitted to the hospitalist service.  COVID-19 vaccinated status: Vaccinated  Significant Events: 9/7>> Admit to [facility] for fever/AMS/hypoglycemia/COVID-19 infection  Significant studies: 9/7>> CT head: No acute intracranial abnormalities 9/7>> CT C-spine: No fracture/subluxation 9/7>> CT chest: No pneumonia 9/8>> CT abdomen/pelvis: Cholelithiasis, moderate prostate enlargement. 9/8>> C-peptide/random insulin levels: Elevated 9/8>> Sulfonylureas levels/proinsulin/insulin ratio: Pending  COVID-19 medications: Remdesivir: 9/8>>  Antibiotics: Vancomycin: 9/7 x 1 Rocephin: 9/7 x 1  Microbiology data: 9/7>> COVID PCR: Positive (CT value 28.7) 9/7 >>blood culture: No growth  Procedures: None  Consults: Nephrology  DVT prophylaxis: heparin injection 5,000 Units Start: 05/20/21 2200    Subjective:   Patient in bed, appears comfortable, denies any headache, no fever, no chest pain or pressure, no shortness of breath , no abdominal pain. No new focal weakness.   Assessment  & Plan :   Sepsis: Sepsis  physiology has resolved-etiology felt to be COVID-19 infection.  Continue Remdesivir.   COVID-19 infection: Not hypoxic-no PNA on imaging-continue Remdesivir x3 days total.    No results for input(s): DDIMER, FERRITIN, LDH, CRP in the last 72 hours.  Acute metabolic encephalopathy: Due to hypoglycemia-encephalopathy has resolved-patient completely awake and alert this morning.    Hypoglycemia: Unclear etiology-improving-decrease D10 to 40 cc/hour-encourage oral intake-if CBGs continue to be stable-suspect can decrease further.  C-peptide/random insulin levels were elevated-awaiting sulfonylurea level.  However he has developed persistent hypoglycemia which seems to be recurrent, have placed him on steroids, CT  abdomen pelvis with IV Contrast non acute, CBGs stable on steroids, will have him do outpatient endocrine follow-up.  We will also educate him on CBG testing today, will be provided with testing supplies and likely discharged on 05/26/2021 to SNF  CBG (last 3)  Recent Labs    05/24/21 1956 05/25/21 0024 05/25/21 0421  GLUCAP 114* 118* 120*      ESRD: Have consulted nephrology for HD, MWF.  HLD: Statin  History of pulmonary fibrosis-on chronic hypoxic respiratory failure-on 2 L of oxygen at home: Seems stable    GI prophylaxis: PPI  Condition - Stable  Family Communication  :  Spouse-[name]- [phone]-called-unable to leave voicemail.  Code Status :  Full Code  Diet :  Diet Order             Diet Heart Room service appropriate? Yes; Fluid consistency: Thin; Fluid restriction: 1500 mL Fluid  Diet effective now                    Disposition Plan  :   Status is: Inpatient  Remains inpatient appropriate because:Inpatient level of care appropriate due to severity of illness  Dispo: The patient is from: Home              Anticipated d/c is to: SNF              Patient currently is not medically stable to d/c.   Difficult to place patient No    Barriers  to discharge: SNF bed.  Antimicorbials  :    Anti-infectives (From admission, onward)    Start     Dose/Rate Route Frequency Ordered Stop   05/22/21 1200  vancomycin (VANCOREADY) IVPB 750 mg/150 mL  Status:  Discontinued        750 mg 150 mL/hr over 60 Minutes Intravenous Every M-W-F (Hemodialysis) 05/21/21 0737 05/21/21 1134   05/22/21 1000  remdesivir 100 mg in sodium chloride 0.9 % 100 mL IVPB  Status:  Discontinued       See Hyperspace for full Linked Orders Report.   100 mg 200 mL/hr over 30 Minutes Intravenous Daily 05/21/21 1134 05/25/21 0806   05/21/21 1400  remdesivir 200 mg in sodium chloride 0.9% 250 mL IVPB       See Hyperspace for full Linked Orders Report.   200 mg 580 mL/hr over 30 Minutes Intravenous Once 05/21/21 1134 05/21/21 1457   05/20/21 2059  vancomycin variable dose per unstable renal function (pharmacist dosing)  Status:  Discontinued         Does not apply See admin instructions 05/20/21 2059 05/21/21 0737   05/20/21 1545  vancomycin (VANCOREADY) IVPB 1500 mg/300 mL        1,500 mg 150 mL/hr over 120 Minutes Intravenous  Once 05/20/21 1546 05/20/21 1925   05/20/21 1245  cefTRIAXone (ROCEPHIN) 2 g in sodium chloride 0.9 % 100 mL IVPB  Status:  Discontinued        2 g 200 mL/hr over 30 Minutes Intravenous Every 24 hours 05/20/21 1232 05/21/21 1122   05/20/21 0000  metroNIDAZOLE (FLAGYL) 500 MG/100ML        500 mg Intravenous  Once 05/20/21 1521 05/20/21 2359       Inpatient Medications  Scheduled Meds:  amLODipine  10 mg Oral Daily   aspirin EC  81 mg Oral Daily   atorvastatin  40 mg Oral q1800   Chlorhexidine Gluconate Cloth  6 each Topical Q0600   Chlorhexidine Gluconate Cloth  6 each Topical Q0600   cinacalcet  90 mg Oral Q M,W,F-HD   dorzolamide-timolol  1 drop Both Eyes BID   gabapentin  300 mg Oral QPM   heparin  5,000 Units Subcutaneous Q12H   hydrALAZINE  25 mg Oral Q8H   latanoprost  1 drop Both Eyes QHS   pantoprazole  40 mg Oral Daily    predniSONE  15 mg Oral BID WC   sevelamer carbonate  1,600 mg Oral TID WC   vitamin B-12  500 mcg Oral Daily   Continuous Infusions:  sodium chloride     sodium chloride     dextrose 40 mL/hr at 05/23/21 2156   PRN Meds:.sodium chloride, sodium chloride, acetaminophen **OR** [DISCONTINUED] acetaminophen, alteplase, dextrose, heparin, hydrALAZINE, lidocaine (PF), lidocaine-prilocaine, [DISCONTINUED] ondansetron **OR** ondansetron (ZOFRAN) IV, pentafluoroprop-tetrafluoroeth   Time Spent in minutes  25    See all Orders from today for further details   Lala Lund M.D on 05/25/2021 at 11:00 AM  To page go to [email or web address] - use universal password  [facility] -  Office  [phone]    Objective:   Vitals:   05/25/21 1000 05/25/21 1015 05/25/21 1025 05/25/21 1030  BP: (!) 99/53 (!) 84/44 (!) 101/53 (!) 121/47  Pulse:      Resp: '15 15 20 18  '$ Temp:      TempSrc:      SpO2:      Weight:      Height:        Wt Readings from Last 3 Encounters:  05/25/21 79.5 kg  03/10/21 75 kg  12/09/20 74 kg    No intake or output data in the 24 hours ending 05/25/21 1100    Physical Exam  Awake Alert, No new F.N deficits, Normal affect [place].AT,PERRAL Supple Neck,No JVD, No cervical lymphadenopathy appriciated.  Symmetrical Chest wall movement, Good air movement bilaterally, CTAB RRR,No Gallops, Rubs or new Murmurs, No Parasternal Heave +ve B.Sounds, Abd Soft, No tenderness, No organomegaly appriciated, No rebound - guarding or rigidity. No Cyanosis, Clubbing or edema, No new Rash or bruise      Data Review:    CBC Recent Labs  Lab 05/20/21 1310 05/20/21 1341 05/21/21 0427 05/22/21 0151 05/23/21 0114 05/25/21 0859  WBC 7.5  --  5.3 8.0 6.5 6.3  HGB 13.2 13.9 12.0* 12.5* 11.5* 11.0*  HCT 44.0 41.0 40.2 40.4 36.4* 34.0*  PLT 147*  --  104* 95* 112* 156  MCV 102.1*  --  102.3* 98.3 96.8 93.4  MCH 30.6  --  30.5 30.4 30.6 30.2  MCHC 30.0  --  29.9* 30.9  31.6 32.4  RDW 16.6*  --  16.6* 16.4* 16.2* 15.8*  LYMPHSABS 0.6*  --   --   --   --   --   MONOABS 1.3*  --   --   --   --   --   EOSABS 0.1  --   --   --   --   --   BASOSABS 0.0  --   --   --   --   --     Chemistries  Recent Labs  Lab 05/20/21 1310 05/20/21 1341 05/21/21 0427 05/21/21 1537 05/21/21 1744 05/22/21 0151 05/23/21 0114 05/25/21 0800  NA 138 136 135  --   --  128* 131* 124*  K 4.5 6.6* 4.3  --   --  4.9 4.3 5.6*  CL 92*  --  88*  --   --  85* 93* 87*  CO2 30  --  30  --   --  '26 26 23  '$ GLUCOSE 162*  --  99 111* 63* 74 89 119*  BUN 22  --  27*  --   --  32* 20 57*  CREATININE 8.37*  --  9.59*  --   --  10.91* 7.12* 11.69*  CALCIUM 9.4  --  9.0  --   --  8.9 8.9 8.4*  MG  --   --  2.2  --   --   --   --   --   AST 17  --   --   --   --   --   --   --   ALT 10  --   --   --   --   --   --   --   ALKPHOS 81  --   --   --   --   --   --   --   BILITOT 0.6  --   --   --   --   --   --   --    ------------------------------------------------------------------------------------------------------------------ No results for input(s): CHOL, HDL, LDLCALC, TRIG, CHOLHDL, LDLDIRECT in the last 72 hours.  Lab Results  Component Value Date   HGBA1C 4.7 (L) 05/21/2021   ------------------------------------------------------------------------------------------------------------------ No results for input(s): TSH, T4TOTAL, T3FREE, THYROIDAB in the last 72 hours.  Invalid input(s): FREET3 ------------------------------------------------------------------------------------------------------------------ No results for input(s): VITAMINB12, FOLATE, FERRITIN, TIBC, IRON, RETICCTPCT in the last 72 hours.   Coagulation profile No results for input(s): INR, PROTIME in the last 168 hours.  No results for input(s): DDIMER in the last 72 hours.   Cardiac Enzymes No results for input(s): CKMB, TROPONINI, MYOGLOBIN in the last 168 hours.  Invalid input(s):  CK ------------------------------------------------------------------------------------------------------------------    Component Value Date/Time   BNP 502.4 (H) 05/21/2021 0427     Radiology Reports CT HEAD WO CONTRAST (5MM)  Result Date: 05/20/2021 CLINICAL DATA:  Status post fall. In stage renal disease on hemodialysis. History of Sjogren syndrome. EXAM: CT HEAD WITHOUT CONTRAST CT CERVICAL SPINE WITHOUT CONTRAST TECHNIQUE: Multidetector CT imaging of the head and cervical spine was performed following the standard protocol without intravenous contrast. Multiplanar CT image reconstructions of the cervical spine were also generated. COMPARISON:  CT head 04/22/2015.  Chest CT 11/06/2015. FINDINGS: CT HEAD FINDINGS Brain: There is no evidence of acute intracranial hemorrhage, mass lesion, brain edema or extra-axial fluid collection. Mildly progressive atrophy with prominence of the ventricles and subarachnoid spaces there is confluent low-density in the periventricular white matter which has progressed, likely due to progressive chronic small vessel ischemic changes. There is no CT evidence of acute cortical infarction. Vascular: Prominent intracranial vascular calcifications. No hyperdense vessel identified. Skull: Negative for fracture or focal lesion. Sinuses/Orbits: The visualized paranasal sinuses and mastoid air cells are clear. No orbital abnormalities are seen. Other: None. CT CERVICAL SPINE FINDINGS Alignment: Normal Skull base and vertebrae: No evidence of acute fracture or traumatic subluxation. There are scattered tiny lucent lesions throughout the vertebral bodies which may relate to chronic renal failure/hyperparathyroidism. Appearance is similar to prior chest CT, making a neoplastic process unlikely. Soft tissues and spinal canal: No prevertebral fluid or swelling. No visible canal hematoma. Disc levels: Multilevel cervical spondylosis with loss of disc height, uncinate spurring and facet  hypertrophy. Resulting mild foraminal narrowing at multiple levels. No large disc herniation identified. Upper chest: Unremarkable. Other: Mild bilateral carotid atherosclerosis.  IMPRESSION: 1. No acute intracranial or calvarial findings. 2. Mildly progressive atrophy and chronic small vessel ischemic changes. 3. No evidence of acute cervical spine fracture, traumatic subluxation or static signs of instability. 4. Mild multilevel spondylosis. Electronically Signed   By: Richardean Sale M.D.   On: 05/20/2021 13:36   CT Cervical Spine Wo Contrast  Result Date: 05/20/2021 CLINICAL DATA:  Status post fall. In stage renal disease on hemodialysis. History of Sjogren syndrome. EXAM: CT HEAD WITHOUT CONTRAST CT CERVICAL SPINE WITHOUT CONTRAST TECHNIQUE: Multidetector CT imaging of the head and cervical spine was performed following the standard protocol without intravenous contrast. Multiplanar CT image reconstructions of the cervical spine were also generated. COMPARISON:  CT head 04/22/2015.  Chest CT 11/06/2015. FINDINGS: CT HEAD FINDINGS Brain: There is no evidence of acute intracranial hemorrhage, mass lesion, brain edema or extra-axial fluid collection. Mildly progressive atrophy with prominence of the ventricles and subarachnoid spaces there is confluent low-density in the periventricular white matter which has progressed, likely due to progressive chronic small vessel ischemic changes. There is no CT evidence of acute cortical infarction. Vascular: Prominent intracranial vascular calcifications. No hyperdense vessel identified. Skull: Negative for fracture or focal lesion. Sinuses/Orbits: The visualized paranasal sinuses and mastoid air cells are clear. No orbital abnormalities are seen. Other: None. CT CERVICAL SPINE FINDINGS Alignment: Normal Skull base and vertebrae: No evidence of acute fracture or traumatic subluxation. There are scattered tiny lucent lesions throughout the vertebral bodies which may relate  to chronic renal failure/hyperparathyroidism. Appearance is similar to prior chest CT, making a neoplastic process unlikely. Soft tissues and spinal canal: No prevertebral fluid or swelling. No visible canal hematoma. Disc levels: Multilevel cervical spondylosis with loss of disc height, uncinate spurring and facet hypertrophy. Resulting mild foraminal narrowing at multiple levels. No large disc herniation identified. Upper chest: Unremarkable. Other: Mild bilateral carotid atherosclerosis. IMPRESSION: 1. No acute intracranial or calvarial findings. 2. Mildly progressive atrophy and chronic small vessel ischemic changes. 3. No evidence of acute cervical spine fracture, traumatic subluxation or static signs of instability. 4. Mild multilevel spondylosis. Electronically Signed   By: Richardean Sale M.D.   On: 05/20/2021 13:36   CT ABDOMEN PELVIS W CONTRAST  Result Date: 05/23/2021 CLINICAL DATA:  80 year old male with a history of pancreatic cancer and abdominal pain with nausea vomiting EXAM: CT ABDOMEN AND PELVIS WITH CONTRAST TECHNIQUE: Multidetector CT imaging of the abdomen and pelvis was performed using the standard protocol following bolus administration of intravenous contrast. CONTRAST:  44m OMNIPAQUE IOHEXOL 300 MG/ML  SOLN COMPARISON:  CT 05/20/2021, 07/28/2015, chest CT 11/06/2015 FINDINGS: Lower chest: No pleural effusion. Atelectatic changes. Nodule at the left lung base measures 6 mm. This nodule has grown from the prior CT dated 11/06/2015. Hepatobiliary: Unremarkable appearance of the liver. Radiopaque gallstones within the gallbladder lumen. No pericholecystic inflammatory changes. Unremarkable appearance of the common bile duct. Pancreas: Unremarkable Spleen: Unremarkable Adrenals/Urinary Tract: - Right adrenal gland:  Unremarkable - Left adrenal gland: Unremarkable. - Right kidney: No hydronephrosis. No nephrolithiasis. Compared to remote CTs there is renal cortical thinning, with mild  perirenal inflammatory changes symmetric to the left. Developing cystic change. - Left Kidney: No hydronephrosis or nephrolithiasis. Symmetric to the right there is renal cortical thinning compared to prior CT scans, with mild perirenal inflammatory changes and developing cystic change. - Urinary Bladder: Urinary bladder decompressed. Stomach/Bowel: - Stomach: Hiatal hernia.  Stomach unremarkable. - Small bowel: Small bowel unremarkable with no distention. No focal wall thickening. - Appendix: Normal. -  Colon: Colon is without abnormal distention. Mild-to-moderate stool burden. Colonic diverticular disease. No focal wall thickening or inflammatory changes. Vascular/Lymphatic: Atherosclerosis of the abdominal aorta. No aneurysm. No dissection. No periaortic inflammatory changes. Mild atherosclerosis at the renal artery origin and the mesenteric artery origin without evidence of occlusion or high-grade stenosis. Bilateral iliac and proximal femoral arteries are patent. No mesenteric or retroperitoneal adenopathy. No inguinal adenopathy. No pelvic adenopathy. IVC filter in place. Reproductive: Prostate measures 4.8 cm. Other: Fat containing left inguinal hernia. Musculoskeletal: Degenerative changes of the spine. No acute displaced fracture. No bony canal narrowing. IMPRESSION: The given history is of pancreatic carcinoma. I see no evidence of pancreatic carcinoma on the current CT, and would advise correlation with the patient's history and potentially further MRI imaging if there is ongoing concern. Developing cystic change of the bilateral kidneys and associated renal cortical thinning, which is most likely chronic renal disease and cystic change of chronic renal failure given the patient's history of dialysis. Strictly speaking, ascending urinary tract infection can not be excluded by CT. Cholelithiasis without evidence acute inflammatory changes. 6 mm nodule at the left lung base. Non-contrast chest CT at 6-12  months is recommended. If the nodule is stable at time of repeat CT, then future CT at 18-24 months (from today's scan) is considered optional for low-risk patients, but is recommended for high-risk patients. This recommendation follows the consensus statement: Guidelines for Management of Incidental Pulmonary Nodules Detected on CT Images: From the Fleischner Society 2017; Radiology 2017; 284:228-243. Additional ancillary findings as above. Electronically Signed   By: Corrie Mckusick D.O.   On: 05/23/2021 12:12   DG Chest Port 1 View  Result Date: 05/20/2021 CLINICAL DATA:  Questionable sepsis EXAM: PORTABLE CHEST 1 VIEW COMPARISON:  Chest radiograph 10/16/2020 FINDINGS: The heart is at the upper limits of normal for size, increased compared to the prior chest radiograph. The mediastinal contours are within normal limits. There is calcified atherosclerotic plaque of the aortic arch. A right subclavian vascular stent is again noted. There is no focal consolidation or pulmonary edema. There is no pleural effusion or pneumothorax. There is no acute osseous abnormality. IMPRESSION: Increased size of the cardiac silhouette compared to the study from 10/16/2020. Given the patient has had pericardial effusions in the past, consider correlation with ECHO. Electronically Signed   By: Valetta Mole M.D.   On: 05/20/2021 13:39   CT CHEST ABDOMEN PELVIS WO CONTRAST  Result Date: 05/20/2021 CLINICAL DATA:  Fever. EXAM: CT CHEST, ABDOMEN AND PELVIS WITHOUT CONTRAST TECHNIQUE: Multidetector CT imaging of the chest, abdomen and pelvis was performed following the standard protocol without IV contrast. COMPARISON:  October 17, 2015. FINDINGS: CT CHEST FINDINGS Cardiovascular: Atherosclerosis of thoracic aorta is noted without aneurysm formation. Normal cardiac size. No pericardial effusion. Coronary artery calcifications are noted. Mediastinum/Nodes: No enlarged mediastinal, hilar, or axillary lymph nodes. Thyroid gland, trachea,  and esophagus demonstrate no significant findings. Lungs/Pleura: No pneumothorax or pleural effusion is noted. Minimal bibasilar subsegmental atelectasis or scarring is noted. Musculoskeletal: No chest wall mass or suspicious bone lesions identified. CT ABDOMEN PELVIS FINDINGS Hepatobiliary: Cholelithiasis is noted. No biliary dilatation is noted. The liver is unremarkable. Pancreas: Unremarkable. No pancreatic ductal dilatation or surrounding inflammatory changes. Spleen: Normal in size without focal abnormality. Adrenals/Urinary Tract: Adrenal glands appear normal. Multiple small bilateral renal cysts are noted. No hydronephrosis or renal obstruction is noted. No renal or ureteral calculi are noted. Urinary bladder is unremarkable. Stomach/Bowel: The stomach appears normal. There is  no evidence of bowel obstruction or inflammation. Vascular/Lymphatic: Aortic atherosclerosis. No enlarged abdominal or pelvic lymph nodes. IVC filter is in infrarenal position. Reproductive: Moderate prostatic enlargement is noted. Other: No abdominal wall hernia or abnormality. No abdominopelvic ascites. Musculoskeletal: No acute or significant osseous findings. IMPRESSION: Cholelithiasis. Coronary artery calcifications are noted suggesting coronary artery disease. Multiple bilateral renal cysts are noted. Moderate prostatic enlargement. No acute abnormality seen in the chest, abdomen or pelvis. Aortic Atherosclerosis (ICD10-I70.0). Electronically Signed   By: Marijo Conception M.D.   On: 05/20/2021 16:02

## 2021-05-25 NOTE — Progress Notes (Signed)
Pt has returned to 4E from dialysis. VSS and placed back on tele. Call light within reach. Daughter at bedside.  Raelyn Number, RN

## 2021-05-25 NOTE — Progress Notes (Signed)
OT Cancellation Note  Patient Details Name: Champ Gialanella MRN: [id number] DOB: Jul 21, 1941   Cancelled Treatment:    Reason Eval/Treat Not Completed: Patient at procedure or test/ unavailable  Pt off unit for HD this AM. Will follow-up for OT session as schedule permits. Layla Maw 05/25/2021, 11:25 AM

## 2021-05-25 NOTE — Progress Notes (Signed)
[facility] Progress Note   Subjective:   feels ok today.  last HD on 9/9 with 1.7 kg.  [facility].  HD here going ok.    Review of systems Denies n/v Denies chest pain  Denies shortness of breath   Objective Vitals:   05/24/21 1728 05/24/21 1952 05/25/21 0027 05/25/21 0422  BP: (!) 155/66 (!) 148/59 (!) 144/64 136/66  Pulse: 63 (!) 58 61 60  Resp: '15 10 16 17  '$ Temp: 97.6 F (36.4 C) 98.1 F (36.7 C) 98.5 F (36.9 C) 97.7 F (36.5 C)  TempSrc: Oral Oral Oral Oral  SpO2: 100% 100% 100% 100%  Weight:      Height:       Physical Exam  General adult male in bed in no acute distress HEENT normocephalic atraumatic extraocular movements intact sclera anicteric Neck supple trachea midline Lungs clear to auscultation bilaterally normal work of breathing at rest  Heart regular rate and rhythm no rubs or gallops appreciated Abdomen soft nontender nondistended Extremities no edema  Psych normal mood and affect Neuro alert and oriented x 3 provides hx and follows commands Access: RU AVG +b/t   Filed Weights   05/20/21 1256 05/22/21 1555 05/22/21 1923  Weight: 75 kg 78.2 kg 76.5 kg   No intake or output data in the 24 hours ending 05/25/21 0732   Additional Objective Labs: Basic Metabolic Panel: Recent Labs  Lab 05/21/21 0427 05/21/21 1537 05/21/21 1744 05/22/21 0151 05/23/21 0114  NA 135  --   --  128* 131*  K 4.3  --   --  4.9 4.3  CL 88*  --   --  85* 93*  CO2 30  --   --  26 26  GLUCOSE 99   < > 63* 74 89  BUN 27*  --   --  32* 20  CREATININE 9.59*  --   --  10.91* 7.12*  CALCIUM 9.0  --   --  8.9 8.9  PHOS 5.2*  --   --  5.8* 4.7*   < > = values in this interval not displayed.   Liver Function Tests: Recent Labs  Lab 05/20/21 1310 05/22/21 0151 05/23/21 0114  AST 17  --   --   ALT 10  --   --   ALKPHOS 81  --   --   BILITOT 0.6  --   --   PROT 8.3*  --   --   ALBUMIN 4.1 3.1* 2.9*    CBC: Recent Labs   Lab 05/20/21 1310 05/20/21 1341 05/21/21 0427 05/22/21 0151 05/23/21 0114  WBC 7.5  --  5.3 8.0 6.5  NEUTROABS 5.5  --   --   --   --   HGB 13.2   < > 12.0* 12.5* 11.5*  HCT 44.0   < > 40.2 40.4 36.4*  MCV 102.1*  --  102.3* 98.3 96.8  PLT 147*  --  104* 95* 112*   < > = values in this interval not displayed.   Blood Culture    Component Value Date/Time   SDES BLOOD LEFT ARM 05/20/2021 1310   SPECREQUEST  05/20/2021 1310    BOTTLES DRAWN AEROBIC AND ANAEROBIC Blood Culture results may not be optimal due to an inadequate volume of blood received in culture bottles   CULT  05/20/2021 1310    NO GROWTH 4 DAYS Performed at [facility], [place] [address] [place], [place]  [id number]    REPTSTATUS PENDING 05/20/2021 1310    CBG: Recent Labs  Lab 05/24/21 1108 05/24/21 1732 05/24/21 1956 05/25/21 0024 05/25/21 0421  GLUCAP 92 114* 114* 118* 120*    Studies/Results: CT ABDOMEN PELVIS W CONTRAST  Result Date: 05/23/2021 CLINICAL DATA:  80 year old male with a history of pancreatic cancer and abdominal pain with nausea vomiting EXAM: CT ABDOMEN AND PELVIS WITH CONTRAST TECHNIQUE: Multidetector CT imaging of the abdomen and pelvis was performed using the standard protocol following bolus administration of intravenous contrast. CONTRAST:  87m OMNIPAQUE IOHEXOL 300 MG/ML  SOLN COMPARISON:  CT 05/20/2021, 07/28/2015, chest CT 11/06/2015 FINDINGS: Lower chest: No pleural effusion. Atelectatic changes. Nodule at the left lung base measures 6 mm. This nodule has grown from the prior CT dated 11/06/2015. Hepatobiliary: Unremarkable appearance of the liver. Radiopaque gallstones within the gallbladder lumen. No pericholecystic inflammatory changes. Unremarkable appearance of the common bile duct. Pancreas: Unremarkable Spleen: Unremarkable Adrenals/Urinary Tract: - Right adrenal gland:  Unremarkable - Left adrenal gland: Unremarkable. - Right kidney: No hydronephrosis. No nephrolithiasis.  Compared to remote CTs there is renal cortical thinning, with mild perirenal inflammatory changes symmetric to the left. Developing cystic change. - Left Kidney: No hydronephrosis or nephrolithiasis. Symmetric to the right there is renal cortical thinning compared to prior CT scans, with mild perirenal inflammatory changes and developing cystic change. - Urinary Bladder: Urinary bladder decompressed. Stomach/Bowel: - Stomach: Hiatal hernia.  Stomach unremarkable. - Small bowel: Small bowel unremarkable with no distention. No focal wall thickening. - Appendix: Normal. - Colon: Colon is without abnormal distention. Mild-to-moderate stool burden. Colonic diverticular disease. No focal wall thickening or inflammatory changes. Vascular/Lymphatic: Atherosclerosis of the abdominal aorta. No aneurysm. No dissection. No periaortic inflammatory changes. Mild atherosclerosis at the renal artery origin and the mesenteric artery origin without evidence of occlusion or high-grade stenosis. Bilateral iliac and proximal femoral arteries are patent. No mesenteric or retroperitoneal adenopathy. No inguinal adenopathy. No pelvic adenopathy. IVC filter in place. Reproductive: Prostate measures 4.8 cm. Other: Fat containing left inguinal hernia. Musculoskeletal: Degenerative changes of the spine. No acute displaced fracture. No bony canal narrowing. IMPRESSION: The given history is of pancreatic carcinoma. I see no evidence of pancreatic carcinoma on the current CT, and would advise correlation with the patient's history and potentially further MRI imaging if there is ongoing concern. Developing cystic change of the bilateral kidneys and associated renal cortical thinning, which is most likely chronic renal disease and cystic change of chronic renal failure given the patient's history of dialysis. Strictly speaking, ascending urinary tract infection can not be excluded by CT. Cholelithiasis without evidence acute inflammatory changes. 6  mm nodule at the left lung base. Non-contrast chest CT at 6-12 months is recommended. If the nodule is stable at time of repeat CT, then future CT at 18-24 months (from today's scan) is considered optional for low-risk patients, but is recommended for high-risk patients. This recommendation follows the consensus statement: Guidelines for Management of Incidental Pulmonary Nodules Detected on CT Images: From the Fleischner Society 2017; Radiology 2017; 284:228-243. Additional ancillary findings as above. Electronically Signed   By: JCorrie MckusickD.O.   On: 05/23/2021 12:12    Medications:  dextrose 40 mL/hr at 05/23/21 2156   remdesivir 100 mg in NS 100 mL 100 mg (05/24/21 1102)    amLODipine  10 mg Oral Daily   aspirin EC  81 mg Oral Daily   atorvastatin  40 mg Oral q1800   Chlorhexidine Gluconate Cloth  6 each Topical Q0600   Chlorhexidine Gluconate Cloth  6 each Topical Q0600   cinacalcet  90 mg Oral Q M,W,F-HD   dorzolamide-timolol  1 drop Both Eyes BID   gabapentin  300 mg Oral QPM   heparin  5,000 Units Subcutaneous Q12H   hydrALAZINE  25 mg Oral Q8H   latanoprost  1 drop Both Eyes QHS   pantoprazole  40 mg Oral Daily   predniSONE  15 mg Oral BID WC   sevelamer carbonate  1,600 mg Oral TID WC   vitamin B-12  500 mcg Oral Daily    Dialysis Orders: [facility] MWF  3.5h  400/1.5   74.5kg  2/2 bath  RUA AVG  Heparin none  - sensipar 90 tiw   Assessment/Plan: Hypoglycemia - improved; per primary team Encephalopathy - resolved. Thought to be due to #1.  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 Nodule noted in LLL on CT. COVID - tested + for COVID infection. no signs of PNA by CXR. S/p IV rocephin & Vanc for possible sepsis. Remdesivir per primary team.  ESRD - on HD MWF schedule HD today per MWF schedule Await am labs HTN/volume-   controlled. Not on antihypertensives at home.  CT/CXR with no signs of volume overload.  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  ferritin 1446.  No indication for ESA.  No iron with increased ferritin. MBD ckd - cont sensipar, binders; watch phos  Claudia Desanctis, MD 7:55 AM 05/25/2021

## 2021-05-26 ENCOUNTER — Other Ambulatory Visit (HOSPITAL_COMMUNITY): Payer: Self-pay

## 2021-05-26 LAB — RENAL FUNCTION PANEL
Albumin: 2.9 g/dL — ABNORMAL LOW (ref 3.5–5.0)
Anion gap: 12 (ref 5–15)
BUN: 33 mg/dL — ABNORMAL HIGH (ref 8–23)
CO2: 28 mmol/L (ref 22–32)
Calcium: 7.7 mg/dL — ABNORMAL LOW (ref 8.9–10.3)
Chloride: 88 mmol/L — ABNORMAL LOW (ref 98–111)
Creatinine, Ser: 8.89 mg/dL — ABNORMAL HIGH (ref 0.61–1.24)
GFR, Estimated: 6 mL/min — ABNORMAL LOW (ref >60)
Glucose, Bld: 130 mg/dL — ABNORMAL HIGH (ref 70–99)
Phosphorus: 5.2 mg/dL — ABNORMAL HIGH (ref 2.5–4.6)
Potassium: 4.9 mmol/L (ref 3.5–5.1)
Sodium: 128 mmol/L — ABNORMAL LOW (ref 135–145)

## 2021-05-26 LAB — GLUCOSE, CAPILLARY
Glucose-Capillary: 126 mg/dL — ABNORMAL HIGH (ref 70–99)
Glucose-Capillary: 128 mg/dL — ABNORMAL HIGH (ref 70–99)
Glucose-Capillary: 130 mg/dL — ABNORMAL HIGH (ref 70–99)

## 2021-05-26 MED ORDER — AMLODIPINE BESYLATE 10 MG PO TABS
10.0000 mg | ORAL_TABLET | Freq: Every day | ORAL | 0 refills | Status: DC
  Filled 2021-05-26: qty 30, 30d supply, fill #0

## 2021-05-26 MED ORDER — BLOOD GLUCOSE MONITOR SYSTEM W/DEVICE KIT
PACK | 1 refills | Status: DC
  Filled 2021-05-26: qty 1, 30d supply, fill #0

## 2021-05-26 MED ORDER — PREDNISONE 5 MG PO TABS
15.0000 mg | ORAL_TABLET | Freq: Two times a day (BID) | ORAL | 0 refills | Status: DC
  Filled 2021-05-26: qty 60, 10d supply, fill #0

## 2021-05-26 MED ORDER — PREDNISONE 20 MG PO TABS
20.0000 mg | ORAL_TABLET | Freq: Two times a day (BID) | ORAL | 0 refills | Status: DC
  Filled 2021-05-26: qty 20, 10d supply, fill #0

## 2021-05-26 MED ORDER — GLUCOSE BLOOD VI STRP
ORAL_STRIP | 1 refills | Status: DC
  Filled 2021-05-26: qty 100, 25d supply, fill #0

## 2021-05-26 MED ORDER — ACCU-CHEK SOFTCLIX LANCETS MISC
1 refills | Status: DC
  Filled 2021-05-26: qty 100, 25d supply, fill #0

## 2021-05-26 MED ORDER — ASPIRIN 81 MG PO TBEC
81.0000 mg | DELAYED_RELEASE_TABLET | Freq: Every day | ORAL | 11 refills | Status: DC
  Filled 2021-05-26: qty 30, 30d supply, fill #0

## 2021-05-26 NOTE — Progress Notes (Signed)
Contacted [facility] to advise them of pt's positive covid diagnosis. Spoke to LeRoy who states pt will need to complete HD on 2nd shift at d/c. Pt will need to arrive at 12:10 for treatment to begin at 12:30. Pt will need to enter from the back door. Unable to reach pt. Spoke to pt's wife via phone to provide this information. Wife voiced understanding. Pt's RN provided update as well. Jolene aware pt to resume treatments tomorrow.   Melven Sartorius  Renal Navigator [phone]

## 2021-05-26 NOTE — Consult Note (Signed)
   [facility] Inpatient Consult   05/26/2021  Jessup Arcila 1941-03-10 [id number]  [place] [facility] [ACO] Patient: [employer]  Patient screened for hospitalization with noted high risk score for unplanned readmission risk.  Review of patient's medical record reveals patient is patient for follow up with [facility] for [facility] per inpatient TOC.  Primary Care Provider listed as [place].  Patient post dialysis scheduled for COVID patient's noted by Renal Navigator.  Plan:  No current [facility] needs home health follow up noted for TOC needs.  For questions contact:   Natividad Brood, RN BSN [facility] Liaison  [phone] business mobile phone Toll free office [phone]  Fax number: [phone] [email or web address]

## 2021-05-26 NOTE — Progress Notes (Signed)
Patient sleeping in bed and had short run V-Tach 5 beats then back to S.R. with no complaints. Cont. To monitor patient and rhythm.

## 2021-05-26 NOTE — Progress Notes (Signed)
Patient given discharge instructions. Daughter present. PIV removed. Telemetry wall monitor removed, CCMD notified. Patient taken down in wheelchair to vehicle by staff.  Daymon Larsen, RN

## 2021-05-26 NOTE — Progress Notes (Addendum)
[facility] Progress Note   Subjective:   last HD on 9/12 with 3.3 kg UF. He thought he had a male dialysis doctor yesterday and initially perseverates on this.  We discussed HD SW was getting in touch with unit to let them know about covid to see if any changes to tx schedule.    Review of systems  Denies n/v Denies chest pain  Denies shortness of breath   Objective Vitals:   05/25/21 1719 05/25/21 1936 05/26/21 0023 05/26/21 0400  BP: (!) 146/73 (!) 141/67 (!) 121/54 (!) 113/50  Pulse: 67 68 65 62  Resp: '18 20 20 16  '$ Temp: 98 F (36.7 C) 98.6 F (37 C) 98.6 F (37 C) 98.6 F (37 C)  TempSrc: Oral Oral Oral Oral  SpO2: 100% 100% 100%   Weight:   77.7 kg 77.9 kg  Height:       Physical Exam  General adult male in bed in no acute distress HEENT normocephalic atraumatic extraocular movements intact sclera anicteric Neck supple trachea midline Lungs clear to auscultation bilaterally normal work of breathing at rest  Heart S1S2 no rub Abdomen soft nontender nondistended Extremities no edema  Psych normal mood and affect Neuro alert and oriented x 3 provides hx and follows commands Access: RU AVG +bruit   Filed Weights   05/25/21 1215 05/26/21 0023 05/26/21 0400  Weight: 76.2 kg 77.7 kg 77.9 kg    Intake/Output Summary (Last 24 hours) at 05/26/2021 0726 Last data filed at 05/26/2021 0634 Gross per 24 hour  Intake 660 ml  Output 3250 ml  Net -2590 ml     Additional Objective Labs: Basic Metabolic Panel: Recent Labs  Lab 05/22/21 0151 05/23/21 0114 05/25/21 0800  NA 128* 131* 124*  K 4.9 4.3 5.6*  CL 85* 93* 87*  CO2 '26 26 23  '$ GLUCOSE 74 89 119*  BUN 32* 20 57*  CREATININE 10.91* 7.12* 11.69*  CALCIUM 8.9 8.9 8.4*  PHOS 5.8* 4.7* 7.2*   Liver Function Tests: Recent Labs  Lab 05/20/21 1310 05/22/21 0151 05/23/21 0114 05/25/21 0800  AST 17  --   --   --   ALT 10  --   --   --   ALKPHOS 81  --   --   --   BILITOT 0.6  --   --   --    PROT 8.3*  --   --   --   ALBUMIN 4.1 3.1* 2.9* 3.0*    CBC: Recent Labs  Lab 05/20/21 1310 05/20/21 1341 05/21/21 0427 05/22/21 0151 05/23/21 0114 05/25/21 0859  WBC 7.5  --  5.3 8.0 6.5 6.3  NEUTROABS 5.5  --   --   --   --   --   HGB 13.2   < > 12.0* 12.5* 11.5* 11.0*  HCT 44.0   < > 40.2 40.4 36.4* 34.0*  MCV 102.1*  --  102.3* 98.3 96.8 93.4  PLT 147*  --  104* 95* 112* 156   < > = values in this interval not displayed.   Blood Culture    Component Value Date/Time   SDES BLOOD LEFT ARM 05/20/2021 1310   SPECREQUEST  05/20/2021 1310    BOTTLES DRAWN AEROBIC AND ANAEROBIC Blood Culture results may not be optimal due to an inadequate volume of blood received in culture bottles   CULT  05/20/2021 1310    NO GROWTH 5 DAYS Performed at [facility], [place] [address],  [place], [place] [address]    REPTSTATUS 05/25/2021 FINAL 05/20/2021 1310    CBG: Recent Labs  Lab 05/25/21 1346 05/25/21 1714 05/25/21 2053 05/26/21 0031 05/26/21 0410  GLUCAP 100* 126* 127* 128* 130*    Studies/Results: No results found.  Medications:  dextrose 40 mL/hr at 05/25/21 1715    amLODipine  10 mg Oral Daily   aspirin EC  81 mg Oral Daily   atorvastatin  40 mg Oral q1800   Chlorhexidine Gluconate Cloth  6 each Topical Q0600   Chlorhexidine Gluconate Cloth  6 each Topical Q0600   cinacalcet  90 mg Oral Q M,W,F-HD   dorzolamide-timolol  1 drop Both Eyes BID   gabapentin  300 mg Oral QPM   heparin  5,000 Units Subcutaneous Q12H   hydrALAZINE  25 mg Oral Q8H   latanoprost  1 drop Both Eyes QHS   pantoprazole  40 mg Oral Daily   predniSONE  15 mg Oral BID WC   sevelamer carbonate  1,600 mg Oral TID WC   vitamin B-12  500 mcg Oral Daily    Dialysis Orders: [facility] MWF  3.5h  400/1.5   74.5kg  2/2 bath  RUA AVG  Heparin none  - sensipar 90 tiw   Assessment/Plan: Hypoglycemia - improved; per primary team Encephalopathy - resolved. Thought to be due to #1.  Chronic resp  failure / pulm fibrosis - currently on RA. Nodule noted in LLL on CT. COVID - tested + for COVID infection. no signs of PNA by CXR. S/p IV rocephin & Vanc for possible sepsis. Remdesivir per primary team.  ESRD - on HD MWF schedule outpatient at [facility] HD per MWF schedule Ordered renal panel for today Contacted HD SW 9/12 pm to discuss covid positive status with HD unit to let us know which shift he is assigned HTN/volume-   controlled. Not on antihypertensives at home.  CT/CXR with no signs of volume overload.  optimize volume with HD Anemia ckd - last Hgb 11.0. ferritin 1446.  No indication for ESA.  No iron with increased ferritin. MBD ckd - cont sensipar, binders; hyperphos Hyponatremia - now s/p HD with UF. Renal panel today   Dispo per primary team.  As above, contacted HD SW on 9/12 pm to discuss his covid positive status with outpt HD unit to ensure we know correct shift assignment  Claudia Desanctis, MD 05/26/2021 7:44 AM

## 2021-05-26 NOTE — Discharge Summary (Addendum)
[name] CVU:[id number] DOB: 1941-08-06 DOA: 05/20/2021  PCP: [name], MD  Admit date: 05/20/2021  Discharge date: 05/26/2021  Admitted From: Home  Disposition:  Home   Recommendations for Outpatient Follow-up:   Follow up with PCP in 1-2 weeks  PCP Please obtain BMP/CBC, 2 view CXR in 1week,  (see Discharge instructions)   PCP Please follow up on the following pending results: needs close Endocrine follow up within 1 week   Home Health: PT, RN   Equipment/Devices: 3in1  Consultations: Renal Discharge Condition: Stable    CODE STATUS: Full    Diet Recommendation: Renal diet with 1.5 L fluid restriction.    Chief Complaint  Patient presents with   Hypoglycemia   Fall     Brief history of present illness from the day of admission and additional interim summary    Patient is a 80 y.o. 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  Patient was found to be COVID-19 positive-he was subsequently admitted to the hospitalist service.   COVID-19 vaccinated status: Vaccinated   Significant Events: 9/7>> Admit to [facility] for fever/AMS/hypoglycemia/COVID-19 infection   Significant studies: 9/7>> CT head: No acute intracranial abnormalities 9/7>> CT C-spine: No fracture/subluxation 9/7>> CT chest: No pneumonia 9/8>> CT abdomen/pelvis: Cholelithiasis, moderate prostate enlargement. 9/8>> C-peptide/random insulin levels: Elevated 9/8>> Sulfonylureas levels/proinsulin/insulin ratio: Pending                                                                 Hospital Course   Sepsis: Sepsis physiology has  resolved-etiology felt to be COVID-19 infection.  Continue Remdesivir.    COVID-19 infection - Not hypoxic-no PNA on imaging-got Remdesivir x3 days total.    Acute metabolic encephalopathy: Due to hypoglycemia-encephalopathy has resolved-patient completely awake and alert this morning.     Hypoglycemia: Unclear etiology-improving-decrease D10 to 40 cc/hour-encourage oral intake-if CBGs continue to be stable-suspect can decrease further.  C-peptide/random insulin levels were elevated - pending sulfonylurea level.  However he has developed persistent hypoglycemia which seems to be recurrent, have placed him on steroids with good results, CT abdomen pelvis with IV Contrast non acute, CBGs stable on steroids, will have him do outpatient endocrine follow-up.  We have educated him on CBG testing will be provided with testing supplies and will be discharged on 05/26/2021 to Home on PO steroids and Endocrine follow up.  Lab Results  Component Value Date   HGBA1C 4.7 (L) 05/21/2021    CBG (last 3)  Recent Labs    05/25/21 2053 05/26/21 0031 05/26/21 0410  GLUCAP 127* 128* 130*   ESRD: Have consulted nephrology for HD, MWF.   HLD:  Statin   History of pulmonary fibrosis-on chronic hypoxic respiratory failure-on 2 L of oxygen at home: Seems stable    Discharge diagnosis     Active Problems:   Hypoglycemia    Discharge instructions    Discharge Instructions     Discharge instructions   Complete by: As directed    Follow with Primary MD Koirala, Dibas, MD in 7 days   Get CBC, CMP, 2 view Chest X ray -  checked next visit within 1 week by Primary MD    Activity: As tolerated with Full fall precautions use walker/cane & assistance as needed  Disposition Home    Diet: Renal diet, 1.5lit/day fluid restriction  Check CBGs before each meal and bedtime, log all results and show it to your PCP  Special Instructions: If you have smoked or chewed Tobacco  in the last 2 yrs please stop  smoking, stop any regular Alcohol  and or any Recreational drug use.  On your next visit with your primary care physician please Get Medicines reviewed and adjusted.  Please request your Prim.MD to go over all Hospital Tests and Procedure/Radiological results at the follow up, please get all Hospital records sent to your Prim MD by signing hospital release before you go home.  If you experience worsening of your admission symptoms, develop shortness of breath, life threatening emergency, suicidal or homicidal thoughts you must seek medical attention immediately by calling 911 or calling your MD immediately  if symptoms less severe.  You Must read complete instructions/literature along with all the possible adverse reactions/side effects for all the Medicines you take and that have been prescribed to you. Take any new Medicines after you have completely understood and accpet all the possible adverse reactions/side effects.   Increase activity slowly   Complete by: As directed        Discharge Medications   Allergies as of 05/26/2021       Reactions   Influenza Virus Vaccine Other (See Comments)        Medication List     TAKE these medications    Accu-Chek Softclix Lancets lancets Use to test blood glucose four times daily as directed.   amLODipine 10 MG tablet Commonly known as: NORVASC Take 1 tablet (10 mg total) by mouth daily.   aspirin 81 MG EC tablet Take 1 tablet (81 mg total) by mouth daily.   atorvastatin 40 MG tablet Commonly known as: LIPITOR Take 1 tablet (40 mg total) by mouth daily at 6 PM.   Blood Glucose Monitor System w/Device Kit Use up to four times daily as directed.   dorzolamide-timolol 22.3-6.8 MG/ML ophthalmic solution Commonly known as: COSOPT Place 1 drop into both eyes 2 (two) times daily.   gabapentin 300 MG capsule Commonly known as: NEURONTIN Take 300 mg by mouth every evening.   glucose blood test strip Use to test blood glucose  four times daily as directed   latanoprost 0.005 % ophthalmic solution Commonly known as: XALATAN Place 1 drop into both eyes at bedtime.   omeprazole 20 MG capsule Commonly known as: PRILOSEC Take 20 mg by mouth daily.   OXYGEN Inhale 2 L into the lungs daily.   predniSONE 5 MG tablet Commonly known as: DELTASONE Take 4 tablets (20 mg total) by mouth 2 (two) times daily with a meal.   sevelamer carbonate 800 MG tablet Commonly known as: RENVELA Take 2 tablets (1,600 mg total) by mouth 3 (three) times daily with meals.   vitamin  B-12 500 MCG tablet Commonly known as: CYANOCOBALAMIN Take 1 tablet (500 mcg total) by mouth daily.               Durable Medical Equipment  (From admission, onward)           Start     Ordered   05/26/21 0715  For home use only DME 3 n 1  Once        05/26/21 8546             Follow-up Information     Koirala, Dibas, MD. Schedule an appointment as soon as possible for a visit in 1 week(s).   Specialty: Family Medicine Contact information: [address] [place] [address] [phone]         Freada Bergeron, MD .   Specialties: Cardiology, Radiology Contact information: [phone] [place] [address] [phone]         Renato Shin, MD. Schedule an appointment as soon as possible for a visit in 1 week(s).   Specialty: Endocrinology Why: hypoglycemia Contact information: [address] [place] [address] [phone]                 Major procedures and Radiology Reports - PLEASE review detailed and final reports thoroughly  -        CT HEAD WO CONTRAST (5MM)  Result Date: 05/20/2021 CLINICAL DATA:  Status post fall. In stage renal disease on hemodialysis. History of Sjogren syndrome. EXAM: CT HEAD WITHOUT CONTRAST CT CERVICAL SPINE WITHOUT CONTRAST TECHNIQUE: Multidetector CT imaging of the head and cervical spine was performed  following the standard protocol without intravenous contrast. Multiplanar CT image reconstructions of the cervical spine were also generated. COMPARISON:  CT head 04/22/2015.  Chest CT 11/06/2015. FINDINGS: CT HEAD FINDINGS Brain: There is no evidence of acute intracranial hemorrhage, mass lesion, brain edema or extra-axial fluid collection. Mildly progressive atrophy with prominence of the ventricles and subarachnoid spaces there is confluent low-density in the periventricular white matter which has progressed, likely due to progressive chronic small vessel ischemic changes. There is no CT evidence of acute cortical infarction. Vascular: Prominent intracranial vascular calcifications. No hyperdense vessel identified. Skull: Negative for fracture or focal lesion. Sinuses/Orbits: The visualized paranasal sinuses and mastoid air cells are clear. No orbital abnormalities are seen. Other: None. CT CERVICAL SPINE FINDINGS Alignment: Normal Skull base and vertebrae: No evidence of acute fracture or traumatic subluxation. There are scattered tiny lucent lesions throughout the vertebral bodies which may relate to chronic renal failure/hyperparathyroidism. Appearance is similar to prior chest CT, making a neoplastic process unlikely. Soft tissues and spinal canal: No prevertebral fluid or swelling. No visible canal hematoma. Disc levels: Multilevel cervical spondylosis with loss of disc height, uncinate spurring and facet hypertrophy. Resulting mild foraminal narrowing at multiple levels. No large disc herniation identified. Upper chest: Unremarkable. Other: Mild bilateral carotid atherosclerosis. IMPRESSION: 1. No acute intracranial or calvarial findings. 2. Mildly progressive atrophy and chronic small vessel ischemic changes. 3. No evidence of acute cervical spine fracture, traumatic subluxation or static signs of instability. 4. Mild multilevel spondylosis. Electronically Signed   By: Richardean Sale M.D.   On: 05/20/2021  13:36   CT Cervical Spine Wo Contrast  Result Date: 05/20/2021 CLINICAL DATA:  Status post fall. In stage renal disease on hemodialysis. History of Sjogren syndrome. EXAM: CT HEAD WITHOUT CONTRAST CT CERVICAL SPINE WITHOUT CONTRAST TECHNIQUE: Multidetector CT imaging of  the head and cervical spine was performed following the standard protocol without intravenous contrast. Multiplanar CT image reconstructions of the cervical spine were also generated. COMPARISON:  CT head 04/22/2015.  Chest CT 11/06/2015. FINDINGS: CT HEAD FINDINGS Brain: There is no evidence of acute intracranial hemorrhage, mass lesion, brain edema or extra-axial fluid collection. Mildly progressive atrophy with prominence of the ventricles and subarachnoid spaces there is confluent low-density in the periventricular white matter which has progressed, likely due to progressive chronic small vessel ischemic changes. There is no CT evidence of acute cortical infarction. Vascular: Prominent intracranial vascular calcifications. No hyperdense vessel identified. Skull: Negative for fracture or focal lesion. Sinuses/Orbits: The visualized paranasal sinuses and mastoid air cells are clear. No orbital abnormalities are seen. Other: None. CT CERVICAL SPINE FINDINGS Alignment: Normal Skull base and vertebrae: No evidence of acute fracture or traumatic subluxation. There are scattered tiny lucent lesions throughout the vertebral bodies which may relate to chronic renal failure/hyperparathyroidism. Appearance is similar to prior chest CT, making a neoplastic process unlikely. Soft tissues and spinal canal: No prevertebral fluid or swelling. No visible canal hematoma. Disc levels: Multilevel cervical spondylosis with loss of disc height, uncinate spurring and facet hypertrophy. Resulting mild foraminal narrowing at multiple levels. No large disc herniation identified. Upper chest: Unremarkable. Other: Mild bilateral carotid atherosclerosis. IMPRESSION: 1. No  acute intracranial or calvarial findings. 2. Mildly progressive atrophy and chronic small vessel ischemic changes. 3. No evidence of acute cervical spine fracture, traumatic subluxation or static signs of instability. 4. Mild multilevel spondylosis. Electronically Signed   By: Richardean Sale M.D.   On: 05/20/2021 13:36   CT ABDOMEN PELVIS W CONTRAST  Result Date: 05/23/2021 CLINICAL DATA:  80 year old male with a history of pancreatic cancer and abdominal pain with nausea vomiting EXAM: CT ABDOMEN AND PELVIS WITH CONTRAST TECHNIQUE: Multidetector CT imaging of the abdomen and pelvis was performed using the standard protocol following bolus administration of intravenous contrast. CONTRAST:  55m OMNIPAQUE IOHEXOL 300 MG/ML  SOLN COMPARISON:  CT 05/20/2021, 07/28/2015, chest CT 11/06/2015 FINDINGS: Lower chest: No pleural effusion. Atelectatic changes. Nodule at the left lung base measures 6 mm. This nodule has grown from the prior CT dated 11/06/2015. Hepatobiliary: Unremarkable appearance of the liver. Radiopaque gallstones within the gallbladder lumen. No pericholecystic inflammatory changes. Unremarkable appearance of the common bile duct. Pancreas: Unremarkable Spleen: Unremarkable Adrenals/Urinary Tract: - Right adrenal gland:  Unremarkable - Left adrenal gland: Unremarkable. - Right kidney: No hydronephrosis. No nephrolithiasis. Compared to remote CTs there is renal cortical thinning, with mild perirenal inflammatory changes symmetric to the left. Developing cystic change. - Left Kidney: No hydronephrosis or nephrolithiasis. Symmetric to the right there is renal cortical thinning compared to prior CT scans, with mild perirenal inflammatory changes and developing cystic change. - Urinary Bladder: Urinary bladder decompressed. Stomach/Bowel: - Stomach: Hiatal hernia.  Stomach unremarkable. - Small bowel: Small bowel unremarkable with no distention. No focal wall thickening. - Appendix: Normal. - Colon: Colon  is without abnormal distention. Mild-to-moderate stool burden. Colonic diverticular disease. No focal wall thickening or inflammatory changes. Vascular/Lymphatic: Atherosclerosis of the abdominal aorta. No aneurysm. No dissection. No periaortic inflammatory changes. Mild atherosclerosis at the renal artery origin and the mesenteric artery origin without evidence of occlusion or high-grade stenosis. Bilateral iliac and proximal femoral arteries are patent. No mesenteric or retroperitoneal adenopathy. No inguinal adenopathy. No pelvic adenopathy. IVC filter in place. Reproductive: Prostate measures 4.8 cm. Other: Fat containing left inguinal hernia. Musculoskeletal: Degenerative changes of the spine.  No acute displaced fracture. No bony canal narrowing. IMPRESSION: The given history is of pancreatic carcinoma. I see no evidence of pancreatic carcinoma on the current CT, and would advise correlation with the patient's history and potentially further MRI imaging if there is ongoing concern. Developing cystic change of the bilateral kidneys and associated renal cortical thinning, which is most likely chronic renal disease and cystic change of chronic renal failure given the patient's history of dialysis. Strictly speaking, ascending urinary tract infection can not be excluded by CT. Cholelithiasis without evidence acute inflammatory changes. 6 mm nodule at the left lung base. Non-contrast chest CT at 6-12 months is recommended. If the nodule is stable at time of repeat CT, then future CT at 18-24 months (from today's scan) is considered optional for low-risk patients, but is recommended for high-risk patients. This recommendation follows the consensus statement: Guidelines for Management of Incidental Pulmonary Nodules Detected on CT Images: From the Fleischner Society 2017; Radiology 2017; 284:228-243. Additional ancillary findings as above. Electronically Signed   By: Corrie Mckusick D.O.   On: 05/23/2021 12:12   DG  Chest Port 1 View  Result Date: 05/20/2021 CLINICAL DATA:  Questionable sepsis EXAM: PORTABLE CHEST 1 VIEW COMPARISON:  Chest radiograph 10/16/2020 FINDINGS: The heart is at the upper limits of normal for size, increased compared to the prior chest radiograph. The mediastinal contours are within normal limits. There is calcified atherosclerotic plaque of the aortic arch. A right subclavian vascular stent is again noted. There is no focal consolidation or pulmonary edema. There is no pleural effusion or pneumothorax. There is no acute osseous abnormality. IMPRESSION: Increased size of the cardiac silhouette compared to the study from 10/16/2020. Given the patient has had pericardial effusions in the past, consider correlation with ECHO. Electronically Signed   By: Valetta Mole M.D.   On: 05/20/2021 13:39   CT CHEST ABDOMEN PELVIS WO CONTRAST  Result Date: 05/20/2021 CLINICAL DATA:  Fever. EXAM: CT CHEST, ABDOMEN AND PELVIS WITHOUT CONTRAST TECHNIQUE: Multidetector CT imaging of the chest, abdomen and pelvis was performed following the standard protocol without IV contrast. COMPARISON:  October 17, 2015. FINDINGS: CT CHEST FINDINGS Cardiovascular: Atherosclerosis of thoracic aorta is noted without aneurysm formation. Normal cardiac size. No pericardial effusion. Coronary artery calcifications are noted. Mediastinum/Nodes: No enlarged mediastinal, hilar, or axillary lymph nodes. Thyroid gland, trachea, and esophagus demonstrate no significant findings. Lungs/Pleura: No pneumothorax or pleural effusion is noted. Minimal bibasilar subsegmental atelectasis or scarring is noted. Musculoskeletal: No chest wall mass or suspicious bone lesions identified. CT ABDOMEN PELVIS FINDINGS Hepatobiliary: Cholelithiasis is noted. No biliary dilatation is noted. The liver is unremarkable. Pancreas: Unremarkable. No pancreatic ductal dilatation or surrounding inflammatory changes. Spleen: Normal in size without focal abnormality.  Adrenals/Urinary Tract: Adrenal glands appear normal. Multiple small bilateral renal cysts are noted. No hydronephrosis or renal obstruction is noted. No renal or ureteral calculi are noted. Urinary bladder is unremarkable. Stomach/Bowel: The stomach appears normal. There is no evidence of bowel obstruction or inflammation. Vascular/Lymphatic: Aortic atherosclerosis. No enlarged abdominal or pelvic lymph nodes. IVC filter is in infrarenal position. Reproductive: Moderate prostatic enlargement is noted. Other: No abdominal wall hernia or abnormality. No abdominopelvic ascites. Musculoskeletal: No acute or significant osseous findings. IMPRESSION: Cholelithiasis. Coronary artery calcifications are noted suggesting coronary artery disease. Multiple bilateral renal cysts are noted. Moderate prostatic enlargement. No acute abnormality seen in the chest, abdomen or pelvis. Aortic Atherosclerosis (ICD10-I70.0). Electronically Signed   By: Marijo Conception M.D.   On:  05/20/2021 16:02    Micro Results     Recent Results (from the past 240 hour(s))  Blood Culture (routine x 2)     Status: None   Collection Time: 05/20/21  1:10 PM   Specimen: BLOOD LEFT ARM  Result Value Ref Range Status   Specimen Description BLOOD LEFT ARM  Final   Special Requests   Final    BOTTLES DRAWN AEROBIC AND ANAEROBIC Blood Culture results may not be optimal due to an inadequate volume of blood received in culture bottles   Culture   Final    NO GROWTH 5 DAYS Performed at [facility], [place] [address] [place], [place] [address]    Report Status 05/25/2021 FINAL  Final  Resp Panel by RT-PCR (Flu A&B, Covid) Nasopharyngeal Swab     Status: Abnormal   Collection Time: 05/20/21  2:40 PM   Specimen: Nasopharyngeal Swab; Nasopharyngeal(NP) swabs in vial transport medium  Result Value Ref Range Status   SARS Coronavirus 2 by RT PCR POSITIVE (A) NEGATIVE Final    Comment: RESULT CALLED TO, READ BACK BY AND VERIFIED WITH: KASEY  CHAPMAN,RN 05/20/2021 AT 1658 BY PAW. (NOTE) SARS-CoV-2 target nucleic acids are DETECTED.  The SARS-CoV-2 RNA is generally detectable in upper respiratory specimens during the acute phase of infection. Positive results are indicative of the presence of the identified virus, but do not rule out bacterial infection or co-infection with other pathogens not detected by the test. Clinical correlation with patient history and other diagnostic information is necessary to determine patient infection status. The expected result is Negative.  Fact Sheet for Patients: EntrepreneurPulse.com.au  Fact Sheet for Healthcare Providers: IncredibleEmployment.be  This test is not yet approved or cleared by the [place] FDA and  has been authorized for detection and/or diagnosis of SARS-CoV-2 by FDA under an Emergency Use Authorization (EUA).  This EUA will remain in effect (meaning this t est can be used) for the duration of  the COVID-19 declaration under Section 564(b)(1) of the Act, 21 U.S.C. section 360bbb-3(b)(1), unless the authorization is terminated or revoked sooner.     Influenza A by PCR NEGATIVE NEGATIVE Final   Influenza B by PCR NEGATIVE NEGATIVE Final    Comment: (NOTE) The Xpert Xpress SARS-CoV-2/FLU/RSV plus assay is intended as an aid in the diagnosis of influenza from Nasopharyngeal swab specimens and should not be used as a sole basis for treatment. Nasal washings and aspirates are unacceptable for Xpert Xpress SARS-CoV-2/FLU/RSV testing.  Fact Sheet for Patients: EntrepreneurPulse.com.au  Fact Sheet for Healthcare Providers: IncredibleEmployment.be  This test is not yet approved or cleared by the [place] FDA and has been authorized for detection and/or diagnosis of SARS-CoV-2 by FDA under an Emergency Use Authorization (EUA). This EUA will remain in effect (meaning this test can be used)  for the duration of the COVID-19 declaration under Section 564(b)(1) of the Act, 21 U.S.C. section 360bbb-3(b)(1), unless the authorization is terminated or revoked.  Performed at [facility], [place] [address] [place], [place] [address]   MRSA Next Gen by PCR, Nasal     Status: Abnormal   Collection Time: 05/22/21  3:45 AM   Specimen: Nasal Mucosa; Nasal Swab  Result Value Ref Range Status   MRSA by PCR Next Gen DETECTED (A) NOT DETECTED Final    Comment: RESULT CALLED TO, READ BACK BY AND VERIFIED WITH: RN BRANDY HARRIS 05/22/21_0 :35 BY TW (NOTE) The GeneXpert MRSA Assay (FDA approved for NASAL specimens only),  is one component of a comprehensive MRSA colonization surveillance program. It is not intended to diagnose MRSA infection nor to guide or monitor treatment for MRSA infections. Test performance is not FDA approved in patients less than 1 years old. Performed at [facility], [place] [address], [place], [place] [address]     Today   Subjective    Travis [name] today has no headache,no chest abdominal pain,no new weakness tingling or numbness, feels much better wants to go home today.     Objective   Blood pressure (!) 113/50, pulse 61, temperature (!) 97.4 F (36.3 C), temperature source Oral, resp. rate 17, height 5' 10" (1.778 m), weight 77.9 kg, SpO2 100 %.   Intake/Output Summary (Last 24 hours) at 05/26/2021 1027 Last data filed at 05/26/2021 0634 Gross per 24 hour  Intake 660 ml  Output 3250 ml  Net -2590 ml    Exam  Awake Alert, No new F.N deficits, Normal affect [place].AT,PERRAL Supple Neck,No JVD, No cervical lymphadenopathy appriciated.  Symmetrical Chest wall movement, Good air movement bilaterally, CTAB RRR,No Gallops,Rubs or new Murmurs, No Parasternal Heave +ve B.Sounds, Abd Soft, Non tender, No organomegaly appriciated, No rebound -guarding or rigidity. No Cyanosis, Clubbing or edema, No new Rash or bruise   Data Review   CBC w  Diff:  Lab Results  Component Value Date   WBC 6.3 05/25/2021   HGB 11.0 (L) 05/25/2021   HCT 34.0 (L) 05/25/2021   PLT 156 05/25/2021   LYMPHOPCT 8 05/20/2021   MONOPCT 17 05/20/2021   EOSPCT 1 05/20/2021   BASOPCT 1 05/20/2021    CMP:  Lab Results  Component Value Date   NA 128 (L) 05/26/2021   K 4.9 05/26/2021   CL 88 (L) 05/26/2021   CO2 28 05/26/2021   BUN 33 (H) 05/26/2021   CREATININE 8.89 (H) 05/26/2021   CREATININE 8.33 (H) 09/09/2016   PROT 8.3 (H) 05/20/2021   ALBUMIN 2.9 (L) 05/26/2021   BILITOT 0.6 05/20/2021   ALKPHOS 81 05/20/2021   AST 17 05/20/2021   ALT 10 05/20/2021  . Lab Results  Component Value Date   HGBA1C 4.7 (L) 05/21/2021    CBG (last 3)  Recent Labs    05/25/21 2053 05/26/21 0031 05/26/21 0410  GLUCAP 127* 128* 130*     Total Time in preparing paper work, data evaluation and todays exam - 73 minutes  Lala Lund M.D on 05/26/2021 at 10:27 AM  [facility]

## 2021-05-26 NOTE — TOC Transition Note (Signed)
Transition of Care [facility]) - CM/SW Discharge Note   Patient Details  Name: Travis [name] MRN: [id number] Date of Birth: May 29, 1941  Transition of Care [facility]) CM/SW Contact:  Cyndi Bender, RN Phone Number: 05/26/2021, 12:05 PM   Clinical Narrative:    Patient stable for discharge home today.Orders for DME and HH PT/RN. Spoke with patient over the phone due to COVID status. Discussed Home health and DME needs. Patient agreeable to RN/PT. Declined aide. List reviewed. Selected advanced home care 1st choice. Dicussed using medicare benefit for home health and DME needs and patient agreeable.   Address, phone number and PCP reviewed with patient. Patient also verbalized he goes to the [facility] clinic  Called made to adpat DME for 3&1 to be dilivered to room prior to discharge.  Call made to [facility] for RN/PT referral  - referral excepted.  Final next level of care: [place] Barriers to Discharge: Barriers Resolved   Patient Goals and CMS Choice Patient states their goals for this hospitalization and ongoing recovery are:: Home CMS Medicare.gov Compare Post Acute Care list provided to:: Patient Choice offered to / list presented to : Patient  Discharge Placement             Home with [facility]        Discharge Plan and Services In-house Referral: NA Discharge Planning Services: CM Consult Post Acute Care Choice: Durable Medical Equipment, Home Health          DME Arranged: 3-N-1 DME Agency: [employer] Date DME Agency Contacted: 05/26/21 Time DME Agency Contacted: [id number] Representative spoke with at DME Agency: [place]: RN, PT [place] Agency: [place] ([place]) Date [place]: 05/26/21 Time [place]: 1200 Representative spoke with at [place]: [place] ([place]) Interventions     Readmission Risk Interventions Readmission Risk Prevention Plan 05/26/2021 05/26/2021   Transportation Screening Complete Complete  PCP or Specialist Appt within 3-5 Days Complete -  HRI or Home Care Consult Complete -  Social Work Consult for [place] Planning/Counseling Complete -  [place] Screening Not Applicable -  Medication Review Press photographer) Complete -  Some recent data might be hidden

## 2021-05-28 LAB — POCT I-STAT EG7
Acid-Base Excess: 6 mmol/L — ABNORMAL HIGH (ref 0.0–2.0)
Bicarbonate: 32.3 mmol/L — ABNORMAL HIGH (ref 20.0–28.0)
Calcium, Ion: 0.83 mmol/L — CL (ref 1.15–1.40)
HCT: 41 % (ref 39.0–52.0)
Hemoglobin: 13.9 g/dL (ref 13.0–17.0)
O2 Saturation: 50 %
Potassium: 6.6 mmol/L (ref 3.5–5.1)
Sodium: 136 mmol/L (ref 135–145)
TCO2: 34 mmol/L — ABNORMAL HIGH (ref 22–32)
pCO2, Ven: 51.8 mmHg (ref 44.0–60.0)
pH, Ven: 7.403 (ref 7.250–7.430)
pO2, Ven: 27 mmHg — CL (ref 32.0–45.0)

## 2021-05-28 LAB — GLUCOSE, CAPILLARY: Glucose-Capillary: 39 mg/dL — CL (ref 70–99)

## 2021-06-11 LAB — SULFONYLUREA HYPOGLYCEMICS PANEL, SERUM
Acetohexamide: NEGATIVE ug/mL (ref 20–60)
Chlorpropamide: NEGATIVE ug/mL (ref 75–250)
Glimepiride: NEGATIVE ng/mL (ref 80–250)
Glipizide: NEGATIVE ng/mL (ref 200–1000)
Glyburide: NEGATIVE ng/mL
Nateglinide: NEGATIVE ng/mL
Repaglinide: NEGATIVE ng/mL
Tolazamide: NEGATIVE ug/mL
Tolbutamide: NEGATIVE ug/mL (ref 40–100)

## 2021-06-18 ENCOUNTER — Other Ambulatory Visit: Payer: Self-pay

## 2021-06-18 ENCOUNTER — Other Ambulatory Visit: Payer: Self-pay | Admitting: Family Medicine

## 2021-06-18 ENCOUNTER — Ambulatory Visit
Admission: RE | Admit: 2021-06-18 | Discharge: 2021-06-18 | Disposition: A | Discharge status: Home / Self Care | Payer: Medicare Other | Source: Ambulatory Visit | Attending: Family Medicine | Admitting: Family Medicine

## 2021-06-18 DIAGNOSIS — J849 Interstitial pulmonary disease, unspecified: Secondary | ICD-10-CM

## 2021-06-18 DIAGNOSIS — G629 Polyneuropathy, unspecified: Secondary | ICD-10-CM | POA: Diagnosis not present

## 2021-06-18 DIAGNOSIS — Z79899 Other long term (current) drug therapy: Secondary | ICD-10-CM | POA: Diagnosis not present

## 2021-06-18 DIAGNOSIS — J8410 Pulmonary fibrosis, unspecified: Secondary | ICD-10-CM | POA: Diagnosis not present

## 2021-06-18 DIAGNOSIS — N186 End stage renal disease: Secondary | ICD-10-CM | POA: Diagnosis not present

## 2021-06-18 DIAGNOSIS — Z992 Dependence on renal dialysis: Secondary | ICD-10-CM | POA: Diagnosis not present

## 2021-06-18 DIAGNOSIS — R0902 Hypoxemia: Secondary | ICD-10-CM | POA: Diagnosis not present

## 2021-06-18 DIAGNOSIS — E162 Hypoglycemia, unspecified: Secondary | ICD-10-CM | POA: Diagnosis not present

## 2021-06-18 DIAGNOSIS — R269 Unspecified abnormalities of gait and mobility: Secondary | ICD-10-CM | POA: Diagnosis not present

## 2021-06-18 DIAGNOSIS — U071 COVID-19: Secondary | ICD-10-CM | POA: Diagnosis not present

## 2021-12-18 ENCOUNTER — Inpatient Hospital Stay (HOSPITAL_COMMUNITY)
Admission: EM | Admit: 2021-12-18 | Discharge: 2021-12-25 | DRG: 304 | Disposition: A | Discharge status: Home Health | Payer: Medicare Other | Attending: Internal Medicine | Admitting: Internal Medicine

## 2021-12-18 ENCOUNTER — Emergency Department (HOSPITAL_COMMUNITY): Payer: Medicare Other

## 2021-12-18 ENCOUNTER — Other Ambulatory Visit: Payer: Self-pay

## 2021-12-18 DIAGNOSIS — J9611 Chronic respiratory failure with hypoxia: Secondary | ICD-10-CM | POA: Diagnosis present

## 2021-12-18 DIAGNOSIS — Z8249 Family history of ischemic heart disease and other diseases of the circulatory system: Secondary | ICD-10-CM

## 2021-12-18 DIAGNOSIS — G319 Degenerative disease of nervous system, unspecified: Secondary | ICD-10-CM | POA: Diagnosis not present

## 2021-12-18 DIAGNOSIS — I509 Heart failure, unspecified: Secondary | ICD-10-CM | POA: Diagnosis not present

## 2021-12-18 DIAGNOSIS — I739 Peripheral vascular disease, unspecified: Secondary | ICD-10-CM | POA: Diagnosis not present

## 2021-12-18 DIAGNOSIS — R519 Headache, unspecified: Secondary | ICD-10-CM

## 2021-12-18 DIAGNOSIS — I252 Old myocardial infarction: Secondary | ICD-10-CM

## 2021-12-18 DIAGNOSIS — Z8701 Personal history of pneumonia (recurrent): Secondary | ICD-10-CM

## 2021-12-18 DIAGNOSIS — I119 Hypertensive heart disease without heart failure: Secondary | ICD-10-CM | POA: Diagnosis not present

## 2021-12-18 DIAGNOSIS — I674 Hypertensive encephalopathy: Secondary | ICD-10-CM | POA: Diagnosis not present

## 2021-12-18 DIAGNOSIS — Z992 Dependence on renal dialysis: Secondary | ICD-10-CM | POA: Diagnosis not present

## 2021-12-18 DIAGNOSIS — I771 Stricture of artery: Secondary | ICD-10-CM | POA: Diagnosis not present

## 2021-12-18 DIAGNOSIS — Z9981 Dependence on supplemental oxygen: Secondary | ICD-10-CM

## 2021-12-18 DIAGNOSIS — N186 End stage renal disease: Secondary | ICD-10-CM | POA: Diagnosis not present

## 2021-12-18 DIAGNOSIS — Z20822 Contact with and (suspected) exposure to covid-19: Secondary | ICD-10-CM | POA: Diagnosis present

## 2021-12-18 DIAGNOSIS — E43 Unspecified severe protein-calorie malnutrition: Secondary | ICD-10-CM | POA: Diagnosis not present

## 2021-12-18 DIAGNOSIS — R4182 Altered mental status, unspecified: Secondary | ICD-10-CM

## 2021-12-18 DIAGNOSIS — E875 Hyperkalemia: Secondary | ICD-10-CM | POA: Diagnosis not present

## 2021-12-18 DIAGNOSIS — J8410 Pulmonary fibrosis, unspecified: Secondary | ICD-10-CM | POA: Diagnosis present

## 2021-12-18 DIAGNOSIS — G9340 Encephalopathy, unspecified: Secondary | ICD-10-CM | POA: Diagnosis not present

## 2021-12-18 DIAGNOSIS — R6889 Other general symptoms and signs: Secondary | ICD-10-CM | POA: Diagnosis not present

## 2021-12-18 DIAGNOSIS — I5042 Chronic combined systolic (congestive) and diastolic (congestive) heart failure: Secondary | ICD-10-CM | POA: Diagnosis not present

## 2021-12-18 DIAGNOSIS — M3500 Sicca syndrome, unspecified: Secondary | ICD-10-CM | POA: Diagnosis present

## 2021-12-18 DIAGNOSIS — E785 Hyperlipidemia, unspecified: Secondary | ICD-10-CM | POA: Diagnosis present

## 2021-12-18 DIAGNOSIS — G4489 Other headache syndrome: Secondary | ICD-10-CM | POA: Diagnosis not present

## 2021-12-18 DIAGNOSIS — I132 Hypertensive heart and chronic kidney disease with heart failure and with stage 5 chronic kidney disease, or end stage renal disease: Secondary | ICD-10-CM | POA: Diagnosis present

## 2021-12-18 DIAGNOSIS — I700 Atherosclerosis of aorta: Secondary | ICD-10-CM | POA: Diagnosis not present

## 2021-12-18 DIAGNOSIS — F0390 Unspecified dementia without behavioral disturbance: Secondary | ICD-10-CM | POA: Diagnosis present

## 2021-12-18 DIAGNOSIS — Z743 Need for continuous supervision: Secondary | ICD-10-CM | POA: Diagnosis not present

## 2021-12-18 DIAGNOSIS — Z86718 Personal history of other venous thrombosis and embolism: Secondary | ICD-10-CM

## 2021-12-18 DIAGNOSIS — Z887 Allergy status to serum and vaccine status: Secondary | ICD-10-CM

## 2021-12-18 DIAGNOSIS — R627 Adult failure to thrive: Secondary | ICD-10-CM | POA: Diagnosis present

## 2021-12-18 DIAGNOSIS — R410 Disorientation, unspecified: Secondary | ICD-10-CM

## 2021-12-18 DIAGNOSIS — I161 Hypertensive emergency: Secondary | ICD-10-CM | POA: Diagnosis not present

## 2021-12-18 DIAGNOSIS — I720 Aneurysm of carotid artery: Secondary | ICD-10-CM | POA: Diagnosis not present

## 2021-12-18 DIAGNOSIS — I639 Cerebral infarction, unspecified: Secondary | ICD-10-CM | POA: Diagnosis not present

## 2021-12-18 DIAGNOSIS — K219 Gastro-esophageal reflux disease without esophagitis: Secondary | ICD-10-CM | POA: Diagnosis present

## 2021-12-18 DIAGNOSIS — I10 Essential (primary) hypertension: Secondary | ICD-10-CM | POA: Diagnosis not present

## 2021-12-18 DIAGNOSIS — M1990 Unspecified osteoarthritis, unspecified site: Secondary | ICD-10-CM | POA: Diagnosis not present

## 2021-12-18 DIAGNOSIS — I2510 Atherosclerotic heart disease of native coronary artery without angina pectoris: Secondary | ICD-10-CM | POA: Diagnosis present

## 2021-12-18 DIAGNOSIS — Z7952 Long term (current) use of systemic steroids: Secondary | ICD-10-CM

## 2021-12-18 DIAGNOSIS — R29818 Other symptoms and signs involving the nervous system: Secondary | ICD-10-CM | POA: Diagnosis not present

## 2021-12-18 DIAGNOSIS — R001 Bradycardia, unspecified: Secondary | ICD-10-CM | POA: Diagnosis not present

## 2021-12-18 DIAGNOSIS — D631 Anemia in chronic kidney disease: Secondary | ICD-10-CM | POA: Diagnosis present

## 2021-12-18 DIAGNOSIS — R1111 Vomiting without nausea: Secondary | ICD-10-CM | POA: Diagnosis not present

## 2021-12-18 DIAGNOSIS — Z79899 Other long term (current) drug therapy: Secondary | ICD-10-CM

## 2021-12-18 DIAGNOSIS — G9341 Metabolic encephalopathy: Secondary | ICD-10-CM | POA: Diagnosis present

## 2021-12-18 DIAGNOSIS — R110 Nausea: Secondary | ICD-10-CM | POA: Diagnosis not present

## 2021-12-18 DIAGNOSIS — E889 Metabolic disorder, unspecified: Secondary | ICD-10-CM | POA: Diagnosis present

## 2021-12-18 DIAGNOSIS — R9082 White matter disease, unspecified: Secondary | ICD-10-CM | POA: Diagnosis not present

## 2021-12-18 DIAGNOSIS — Z7982 Long term (current) use of aspirin: Secondary | ICD-10-CM

## 2021-12-18 DIAGNOSIS — I120 Hypertensive chronic kidney disease with stage 5 chronic kidney disease or end stage renal disease: Secondary | ICD-10-CM | POA: Diagnosis not present

## 2021-12-18 DIAGNOSIS — I6523 Occlusion and stenosis of bilateral carotid arteries: Secondary | ICD-10-CM | POA: Diagnosis not present

## 2021-12-18 LAB — DIFFERENTIAL
Abs Immature Granulocytes: 0.02 10*3/uL (ref 0.00–0.07)
Basophils Absolute: 0.1 10*3/uL (ref 0.0–0.1)
Basophils Relative: 1 %
Eosinophils Absolute: 0.2 10*3/uL (ref 0.0–0.5)
Eosinophils Relative: 2 %
Immature Granulocytes: 0 %
Lymphocytes Relative: 20 %
Lymphs Abs: 1.5 10*3/uL (ref 0.7–4.0)
Monocytes Absolute: 1.4 10*3/uL — ABNORMAL HIGH (ref 0.1–1.0)
Monocytes Relative: 19 %
Neutro Abs: 4.3 10*3/uL (ref 1.7–7.7)
Neutrophils Relative %: 58 %

## 2021-12-18 LAB — COMPREHENSIVE METABOLIC PANEL
ALT: 9 U/L (ref 0–44)
AST: 15 U/L (ref 15–41)
Albumin: 4.2 g/dL (ref 3.5–5.0)
Alkaline Phosphatase: 103 U/L (ref 38–126)
Anion gap: 12 (ref 5–15)
BUN: 15 mg/dL (ref 8–23)
CO2: 34 mmol/L — ABNORMAL HIGH (ref 22–32)
Calcium: 9.3 mg/dL (ref 8.9–10.3)
Chloride: 92 mmol/L — ABNORMAL LOW (ref 98–111)
Creatinine, Ser: 7.11 mg/dL — ABNORMAL HIGH (ref 0.61–1.24)
GFR, Estimated: 7 mL/min — ABNORMAL LOW (ref >60)
Glucose, Bld: 116 mg/dL — ABNORMAL HIGH (ref 70–99)
Potassium: 4.3 mmol/L (ref 3.5–5.1)
Sodium: 138 mmol/L (ref 135–145)
Total Bilirubin: 0.4 mg/dL (ref 0.3–1.2)
Total Protein: 8.5 g/dL — ABNORMAL HIGH (ref 6.5–8.1)

## 2021-12-18 LAB — I-STAT CHEM 8, ED
BUN: 17 mg/dL (ref 8–23)
Calcium, Ion: 1.04 mmol/L — ABNORMAL LOW (ref 1.15–1.40)
Chloride: 94 mmol/L — ABNORMAL LOW (ref 98–111)
Creatinine, Ser: 7.1 mg/dL — ABNORMAL HIGH (ref 0.61–1.24)
Glucose, Bld: 114 mg/dL — ABNORMAL HIGH (ref 70–99)
HCT: 43 % (ref 39.0–52.0)
Hemoglobin: 14.6 g/dL (ref 13.0–17.0)
Potassium: 4.3 mmol/L (ref 3.5–5.1)
Sodium: 138 mmol/L (ref 135–145)
TCO2: 35 mmol/L — ABNORMAL HIGH (ref 22–32)

## 2021-12-18 LAB — RESP PANEL BY RT-PCR (FLU A&B, COVID) ARPGX2
Influenza A by PCR: NEGATIVE
Influenza B by PCR: NEGATIVE
SARS Coronavirus 2 by RT PCR: NEGATIVE

## 2021-12-18 LAB — CBC
HCT: 39.9 % (ref 39.0–52.0)
Hemoglobin: 12.4 g/dL — ABNORMAL LOW (ref 13.0–17.0)
MCH: 30.5 pg (ref 26.0–34.0)
MCHC: 31.1 g/dL (ref 30.0–36.0)
MCV: 98 fL (ref 80.0–100.0)
Platelets: 185 10*3/uL (ref 150–400)
RBC: 4.07 MIL/uL — ABNORMAL LOW (ref 4.22–5.81)
RDW: 16.9 % — ABNORMAL HIGH (ref 11.5–15.5)
WBC: 7.5 10*3/uL (ref 4.0–10.5)
nRBC: 0 % (ref 0.0–0.2)

## 2021-12-18 LAB — ETHANOL: Alcohol, Ethyl (B): <10 mg/dL (ref <10)

## 2021-12-18 LAB — APTT: aPTT: 32 seconds (ref 24–36)

## 2021-12-18 LAB — PROTIME-INR
INR: 1 (ref 0.8–1.2)
Prothrombin Time: 13.4 seconds (ref 11.4–15.2)

## 2021-12-18 LAB — CBG MONITORING, ED: Glucose-Capillary: 70 mg/dL (ref 70–99)

## 2021-12-18 MED ORDER — PREDNISONE 20 MG PO TABS
20.0000 mg | ORAL_TABLET | Freq: Two times a day (BID) | ORAL | Status: DC
  Administered 2021-12-19 – 2021-12-21 (×5): 20 mg via ORAL
  Filled 2021-12-18 (×5): qty 1

## 2021-12-18 MED ORDER — HEPARIN SODIUM (PORCINE) 5000 UNIT/ML IJ SOLN
5000.0000 [IU] | Freq: Three times a day (TID) | INTRAMUSCULAR | Status: DC
  Administered 2021-12-19 – 2021-12-25 (×17): 5000 [IU] via SUBCUTANEOUS
  Filled 2021-12-18 (×17): qty 1

## 2021-12-18 MED ORDER — CLEVIDIPINE BUTYRATE 0.5 MG/ML IV EMUL
INTRAVENOUS | Status: AC
  Administered 2021-12-18: 2 mg/h via INTRAVENOUS
  Filled 2021-12-18: qty 50

## 2021-12-18 MED ORDER — POLYVINYL ALCOHOL 1.4 % OP SOLN: 1.0000 [drp] | OPHTHALMIC | Status: DC | PRN

## 2021-12-18 MED ORDER — DOCUSATE SODIUM 100 MG PO CAPS: 100.0000 mg | ORAL_CAPSULE | Freq: Two times a day (BID) | ORAL | Status: DC | PRN

## 2021-12-18 MED ORDER — PANTOPRAZOLE SODIUM 40 MG PO TBEC
40.0000 mg | DELAYED_RELEASE_TABLET | Freq: Every day | ORAL | Status: DC
  Administered 2021-12-19 – 2021-12-25 (×6): 40 mg via ORAL
  Filled 2021-12-18 (×7): qty 1

## 2021-12-18 MED ORDER — CLEVIDIPINE BUTYRATE 0.5 MG/ML IV EMUL
0.0000 mg/h | INTRAVENOUS | Status: DC
  Administered 2021-12-19: 8 mg/h via INTRAVENOUS
  Administered 2021-12-19: 11 mg/h via INTRAVENOUS
  Administered 2021-12-19: 14 mg/h via INTRAVENOUS
  Administered 2021-12-19: 5 mg/h via INTRAVENOUS
  Administered 2021-12-19: 12 mg/h via INTRAVENOUS
  Administered 2021-12-20: 9 mg/h via INTRAVENOUS
  Filled 2021-12-18 (×11): qty 50

## 2021-12-18 MED ORDER — ONDANSETRON HCL 4 MG/2ML IJ SOLN
4.0000 mg | Freq: Once | INTRAMUSCULAR | Status: AC
Start: 2021-12-18 — End: 2021-12-18
  Administered 2021-12-18: 4 mg via INTRAVENOUS
  Filled 2021-12-18: qty 2

## 2021-12-18 MED ORDER — IPRATROPIUM-ALBUTEROL 0.5-2.5 (3) MG/3ML IN SOLN: 3.0000 mL | RESPIRATORY_TRACT | Status: DC | PRN

## 2021-12-18 MED ORDER — POLYETHYLENE GLYCOL 3350 17 G PO PACK: 17.0000 g | PACK | Freq: Every day | ORAL | Status: DC | PRN

## 2021-12-18 NOTE — ED Provider Triage Note (Signed)
Emergency Medicine Provider Triage Evaluation Note ? ?Travis [name] , a 81 y.o. male  was evaluated in triage.  Pt altered. History obtained via EMS.  Patient is coming from home for complaints of headache and vomiting that started today at 1300 after dialysis.  History limited due to patient's acuity and altered mental status. ? ?Review of Systems  ?Positive: Headache, vomiting ?Negative:  ? ?Physical Exam  ?Ht 5\' 10"  (1.778 m)   Wt 77.9 kg   BMI 24.64 kg/m?  ?Gen:   Awake, vomiting ?Resp:  Normal effort  ?MSK:    ?Other:  Not following commands adequately.  He is awake.  Oriented x2 he thinks it is 12/20/1940 is his birthdate.  Not answering questions appropriately.  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  Unequal pupils with right greater than left.  Unable to follow simple commands though EOMI not obtained. ? ?Medical Decision Making  ?Medically screening exam initiated at 8:07 PM.  Appropriate orders placed.  [name] was informed that the remainder of the evaluation will be completed by another provider, this initial triage assessment does not replace that evaluation, and the importance of remaining in the ED until their evaluation is complete. ? ?Code stroke activated given neurodeficit and unequal pupils.  Patient headache with blood pressure systolically in the 386U.  CBG 70. ?  ?[name], PA-C ?12/18/21 2011 ? ?

## 2021-12-18 NOTE — ED Provider Notes (Addendum)
?[facility] ?Provider Note ? ? ?CSN: [id number] ?Arrival date & time: 12/18/21  2004 ? ?An emergency department physician performed an initial assessment on this suspected stroke patient at 2023. ? ?History ? ?Chief Complaint  ?Patient presents with  ? Headache  ? ? ?Travis [name] is a 81 y.o. male. ? ?HPI ?Level 5 caveat secondary to altered mental status and patient initially activated as code stroke ?history obtained from wife ?81 year old male who is on dialysis.  His wife states that he had a headache today before going to dialysis.  He went to dialysis and completed and continued to complain of headache.  He has had increased confusion and decreased alertness.  He was brought in by EMS.  No focal neurodeficits are noted on neurologist exam.  Patient had head CT without any obvious bleeding.  His blood pressure was significantly elevated to 20. ?  ? ?Home Medications ?Prior to Admission medications   ?Medication Sig Start Date End Date Taking? Authorizing Provider  ?Accu-Chek Softclix Lancets lancets Use to test blood glucose four times daily as directed. 05/26/21   Thurnell Lose, MD  ?amLODipine (NORVASC) 10 MG tablet Take 1 tablet (10 mg total) by mouth daily. 05/26/21   Thurnell Lose, MD  ?aspirin EC 81 MG EC tablet Take 1 tablet (81 mg total) by mouth daily. 04/23/15   Reyne Dumas, MD  ?atorvastatin (LIPITOR) 40 MG tablet Take 1 tablet (40 mg total) by mouth daily at 6 PM. 04/23/15   Reyne Dumas, MD  ?Blood Glucose Monitoring Suppl (BLOOD GLUCOSE MONITOR SYSTEM) w/Device KIT Use up to four times daily as directed. 05/26/21   Thurnell Lose, MD  ?cyanocobalamin 500 MCG tablet Take 1 tablet (500 mcg total) by mouth daily. 12/26/14   Donne Hazel, MD  ?dorzolamide-timolol (COSOPT) 22.3-6.8 MG/ML ophthalmic solution Place 1 drop into both eyes 2 (two) times daily. 05/05/21   [provider]  ?gabapentin (NEURONTIN) 300 MG capsule Take 300 mg by mouth  every evening.    [provider]  ?glucose blood test strip Use to test blood glucose four times daily as directed 05/26/21   Thurnell Lose, MD  ?latanoprost (XALATAN) 0.005 % ophthalmic solution Place 1 drop into both eyes at bedtime. 05/05/21   [provider]  ?omeprazole (PRILOSEC) 20 MG capsule Take 20 mg by mouth daily.  07/31/15   [provider]  ?OXYGEN Inhale 2 L into the lungs daily.    [provider]  ?predniSONE (DELTASONE) 20 MG tablet Take 1 tablet (20 mg total) by mouth 2 (two) times daily with a meal. 05/26/21   Thurnell Lose, MD  ?sevelamer carbonate (RENVELA) 800 MG tablet Take 2 tablets (1,600 mg total) by mouth 3 (three) times daily with meals. 05/17/15   Cherene Altes, MD  ?   ? ?Allergies    ?Influenza virus vaccine   ? ?Review of Systems   ?Review of Systems  ?Unable to perform ROS: Acuity of condition  ? ?Physical Exam ?Updated Vital Signs ?BP (!) 183/82   Pulse 67   Temp 98.2 ?F (36.8 ?C)   Resp 15   Ht 1.778 m (_0 )   Wt 77.9 kg   SpO2 99%   BMI 24.64 kg/m?  ?Physical Exam ?Vitals and nursing note reviewed.  ?Constitutional:   ?   General: He is not in acute distress. ?   Appearance: He is well-developed.  ?HENT:  ?   Head:  Normocephalic.  ?   Mouth/Throat:  ?   Mouth: Mucous membranes are moist.  ?Eyes:  ?   Extraocular Movements: Extraocular movements intact.  ?   Pupils: Pupils are equal, round, and reactive to light.  ?Cardiovascular:  ?   Rate and Rhythm: Normal rate and regular rhythm.  ?   Heart sounds: Normal heart sounds.  ?Pulmonary:  ?   Effort: Pulmonary effort is normal.  ?   Breath sounds: Normal breath sounds.  ?Abdominal:  ?   General: Bowel sounds are normal.  ?   Palpations: Abdomen is soft.  ?Musculoskeletal:     ?   General: Normal range of motion.  ?   Cervical back: Normal range of motion and neck supple.  ?   Comments: Dialysis access right upper extremity  ?Skin: ?   General: Skin is warm and dry.  ?    Capillary Refill: Capillary refill takes less than 2 seconds.  ?Neurological:  ?   Mental Status: He is alert.  ?   GCS: GCS eye subscore is 3. GCS verbal subscore is 4. GCS motor subscore is 6.  ? ? ?ED Results / Procedures / Treatments   ?Labs ?(all labs ordered are listed, but only abnormal results are displayed) ?Labs Reviewed  ?CBC - Abnormal; Notable for the following components:  ?    Result Value  ? RBC 4.07 (*)   ? Hemoglobin 12.4 (*)   ? RDW 16.9 (*)   ? All other components within normal limits  ?DIFFERENTIAL - Abnormal; Notable for the following components:  ? Monocytes Absolute 1.4 (*)   ? All other components within normal limits  ?COMPREHENSIVE METABOLIC PANEL - Abnormal; Notable for the following components:  ? Chloride 92 (*)   ? CO2 34 (*)   ? Glucose, Bld 116 (*)   ? Creatinine, Ser 7.11 (*)   ? Total Protein 8.5 (*)   ? GFR, Estimated 7 (*)   ? All other components within normal limits  ?I-STAT CHEM 8, ED - Abnormal; Notable for the following components:  ? Chloride 94 (*)   ? Creatinine, Ser 7.10 (*)   ? Glucose, Bld 114 (*)   ? Calcium, Ion 1.04 (*)   ? TCO2 35 (*)   ? All other components within normal limits  ?RESP PANEL BY RT-PCR (FLU A&B, COVID) ARPGX2  ?ETHANOL  ?PROTIME-INR  ?APTT  ?CBG MONITORING, ED  ? ? ?EKG ?EKG Interpretation ? ?Date/Time:  Friday December 18 2021 20:16:49 EDT ?Ventricular Rate:  71 ?PR Interval:  216 ?QRS Duration: 100 ?QT Interval:  444 ?QTC Calculation: 483 ?R Axis:   -38 ?Text Interpretation: Sinus rhythm Borderline prolonged PR interval Probable left atrial enlargement LAD, consider left anterior fascicular block LVH with secondary repolarization abnormality Borderline prolonged QT interval Confirmed by Pattricia Boss [phone]) on 12/18/2021 10:03:06 PM ? ?Radiology ?CT HEAD CODE STROKE WO CONTRAST ? ?Result Date: 12/18/2021 ?CLINICAL DATA:  Code stroke.  TIA. EXAM: CT HEAD WITHOUT CONTRAST TECHNIQUE: Contiguous axial images were obtained from the base of the skull  through the vertex without intravenous contrast. RADIATION DOSE REDUCTION: This exam was performed according to the departmental dose-optimization program which includes automated exposure control, adjustment of the mA and/or kV according to patient size and/or use of iterative reconstruction technique. COMPARISON:  CT head without contrast 05/20/2021 FINDINGS: Brain: Advanced atrophy and white matter disease is similar the prior exam. No acute infarct, hemorrhage, or mass lesion is present. The ventricles are of  normal size. Basal ganglia are stable. Insular ribbon is intact. Study slightly degraded by patient motion. The ventricles are proportionate to the degree of atrophy. No significant extraaxial fluid collection is present. The brainstem and cerebellum are within normal limits. Vascular: Atherosclerotic calcifications are present within the cavernous internal carotid arteries bilaterally. No hyperdense vessel is present. Skull: Calvarium is intact. No focal lytic or blastic lesions are present. No significant extracranial soft tissue lesion is present. Sinuses/Orbits: The paranasal sinuses and mastoid air cells are clear. The globes and orbits are within normal limits. ASPECTS [facility] Stroke Program Early CT Score) - Ganglionic level infarction (caudate, lentiform nuclei, internal capsule, insula, M1-M3 cortex): 7/7 - Supraganglionic infarction (M4-M6 cortex): 3/3 Total score (0-10 with 10 being normal): 10/10 IMPRESSION: 1. Stable advanced atrophy and white matter disease. This likely reflects the sequela of chronic microvascular ischemia. 2. No acute intracranial abnormality or significant interval change. 3. ASPECTS is 10/10. The above was relayed via text pager to Dr. Rory Percy on 12/18/2021 at 20:36 . Electronically Signed   By: [place] Morelle M.D.   On: 12/18/2021 20:36   ? ?Procedures ?Marland KitchenCritical Care ?Performed by: Pattricia Boss, MD ?Authorized by: Pattricia Boss, MD  ? ?Critical care provider  statement:  ?  Critical care time (minutes):  75 ?  Critical care was necessary to treat or prevent imminent or life-threatening deterioration of the following conditions:  CNS failure or compromise ?  Critical care was time spent

## 2021-12-18 NOTE — ED Notes (Signed)
Pt is non-compliant w/ neuro assessment, unable to assess if pt is refusing to participate due to pain. ?

## 2021-12-18 NOTE — H&P (Addendum)
? ?NAME:  Travis [name], MRN:  [id number], DOB:  1941-08-26, LOS: 0 ?ADMISSION DATE:  12/18/2021, CONSULTATION DATE:  4/7 ?REFERRING MD:  Dr. Jeanell Sparrow, CHIEF COMPLAINT:  hypertensive emergency  ? ?History of Present Illness:  ?Patient is a 81 year old male with pertinent PMH of ESRD on dialysis (received dialysis today on 4/7), pulmonary fibrosis, chronic combined systolic/diastolic CHF, anemia, HTN, Sjogren's disease presents to [facility] ED with HA, N/V. ? ?Patient went to dialysis today around 1 PM on 4/7.  When patient came home from dialysis, he started having L sided weakness with headache.  Family unable to give a specific time of onset.  LKN 1300.  Patient brought to [facility] ED. ? ?Upon arrival to [facility] ED on 4/7, patient still with left sided weakness and headache. Patient also with AMS. Patient also complaining of nausea/vomiting.  Code stroke activated.  Neuro consulted.  SBP in the 230s.  CT head negative for acute findings.  MRI ordered.  Patient started on Cleviprex.  CBG 70.  Patient afebrile.  COVID flu negative.  Ethanol WNL.  WBC 7.5, Hgb 12.4, creat 7.11.  CXR pending. PCCM consulted for ICU admission. ? ?Pertinent  Medical History  ? ?Past Medical History:  ?Diagnosis Date  ? Anemia   ? Arthritis   ? Chronic combined systolic and diastolic CHF (congestive heart failure) ([place])   ? a. Etiology of low EF not defined.  ? Chronic respiratory failure ([place])   ? DVT (deep venous thrombosis) ([place])   ? a. 01/2015: RLE DVT. VQ scan intermediate probability. Underwent renal bx complicated by perinephric hematoma; anticoagulation stopped and IVC filter placed.  ? ESRD on hemodialysis [facility]) June 2016  ? a. 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 Received plasma exchange and steroids but didn't respond and ended up starting hemodialysis June 2016.   ? Essential hypertension   ? GERD (gastroesophageal reflux disease)   ? History of nuclear stress test   ? Myoview 9/19 [facility]):  low risk   ? Perinephric hematoma 01/2015  ? Pneumonia   ? 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 Protein calorie malnutrition ([place])   ? Pulmonary fibrosis ([place])   ? Sjogren's disease ([place]) 01/28/2015  ? Syncope   ? a. 04/2015 in HD in setting of BP 50s.  ? Unintentional weight loss   ? Wears partial dentures   ?' ? ? ?Significant Hospital Events: ?Including procedures, antibiotic start and stop dates in addition to other pertinent events   ?4/7: Admitted to [facility] hypertensive emergency; concern for stroke but negative; started on Cleviprex ? ?Interim History / Subjective:  ?Patient confused; able to state name but not able to answer any other questions ?On Cleviprex with SBP around 180 ? ?Objective   ?Blood pressure (!) 183/82, pulse 67, temperature 98.2 ?F (36.8 ?C), resp. rate 15, height 5\' 10"  (1.778 m), weight 77.9 kg, SpO2 99 %. ?   ?   ? ?Intake/Output Summary (Last 24 hours) at 12/18/2021 2239 ?Last data filed at 12/18/2021 2103 ?Gross per 24 hour  ?Intake 5.59 ml  ?Output --  ?Net 5.59 ml  ? ?Filed Weights  ? 12/18/21 2007  ?Weight: 77.9 kg  ? ? ?Examination: ?General: Confused elderly appearing male ?HEENT: MM pink/moist ?Neuro: confused; able to state name but not able to answer any other questions; PERRL; MAE ?CV: s1s2, no m/r/g ?PULM:  dim clear BS bilaterally; [place]  ?GI: soft, bsx4 active  ?Extremities: warm/dry, trace BLE edema  ?Skin: no  rashes or lesions appreciated ? ? ?Resolved Hospital Problem list   ? ? ?Assessment & Plan:  ? ?Hypertensive emergency ?P: ?-Will admit to ICU with continuous telemetry monitoring ?-Continue Cleviprex for SBP goal less than 180 ?-We will hold on starting home Norvasc for now ? ?Acute encephalopathy: Code stroke called for L sided weakness with headache; CT head negative for acute findings ?HA ?P: ?-Neuro following; appreciate recs ?-continue cleviprex as above ?-Will need MRI ?-We will send drug screen, salicylate level, acetaminophen level, ethanol, ammonia ?-Check UA, UC if able to collect  urine ?-Limit sedating meds ?-frequent neurochecks ? ?ESRD on dialysis MWF: Last received dialysis on 4/7 ?P: ?-will need nephrology consult in am for dialysis needs ?-Trend BMP / urinary output ?-Replace electrolytes as indicated ?-Avoid nephrotoxic agents, ensure adequate renal perfusion ? ?Chronic respiratory failure ?Pulmonary fibrosis:  2 LNC at home ?P: ?-continue home o2 2L [place] ?-sat goal >92% ?-duoneb prn for wheezing ? ?Sjogren syndrome ?P: ?-Continue home prednisone 20 twice daily ?-artificial tears as needed ? ?Nausea/vomiting ?P: ?-seems to have improved since admission ? ?Anemia ?P: ?-trend cbc ? ?Chronic combined systolic/diastolic HF ?HTN/HLD ?P: ?-Cleviprex as above ?-We will hold Norvasc for now ?-Continue statin when able to take PO ?-ASA when able to take PO ?-Daily weights; strict I/O ? ?GERD ?P: ?-PPI ? ?Best Practice (right click and "Reselect all SmartList Selections" daily)  ? ?Diet/type: NPO ?DVT prophylaxis: prophylactic heparin  ?GI prophylaxis: PPI ?Lines: N/A ?Foley:  N/A ?Code Status:  full code ?Last date of multidisciplinary goals of care discussion [4/7 spoke with wife and daughter at bedside and they state that they want him to be full code] ? ?Labs   ?CBC: ?Recent Labs  ?Lab 12/18/21 ?2011 12/18/21 ?2018  ?WBC 7.5  --   ?NEUTROABS 4.3  --   ?HGB 12.4* 14.6  ?HCT 39.9 43.0  ?MCV 98.0  --   ?PLT 185  --   ? ? ?Basic Metabolic Panel: ?Recent Labs  ?Lab 12/18/21 ?2011 12/18/21 ?2018  ?NA 138 138  ?K 4.3 4.3  ?CL 92* 94*  ?CO2 34*  --   ?GLUCOSE 116* 114*  ?BUN 15 17  ?CREATININE 7.11* 7.10*  ?CALCIUM 9.3  --   ? ?GFR: ?Estimated Creatinine Clearance: 8.6 mL/min (A) (by C-G formula based on SCr of 7.1 mg/dL (H)). ?Recent Labs  ?Lab 12/18/21 ?2011  ?WBC 7.5  ? ? ?Liver Function Tests: ?Recent Labs  ?Lab 12/18/21 ?2011  ?AST 15  ?ALT 9  ?ALKPHOS 103  ?BILITOT 0.4  ?PROT 8.5*  ?ALBUMIN 4.2  ? ?No results for input(s): LIPASE, AMYLASE in the last 168 hours. ?No results for input(s): AMMONIA  in the last 168 hours. ? ?ABG ?   ?Component Value Date/Time  ? PHART 7.499 (H) 04/19/2015 0534  ? PCO2ART 28.3 (L) 04/19/2015 0534  ? PO2ART 41.0 (L) 04/19/2015 0534  ? HCO3 32.3 (H) 05/20/2021 1341  ? HCO3 32.3 (H) 05/20/2021 1341  ? TCO2 35 (H) 12/18/2021 2018  ? O2SAT 50.0 05/20/2021 1341  ? O2SAT 50.0 05/20/2021 1341  ?  ? ?Coagulation Profile: ?Recent Labs  ?Lab 12/18/21 ?2011  ?INR 1.0  ? ? ?Cardiac Enzymes: ?No results for input(s): CKTOTAL, CKMB, CKMBINDEX, TROPONINI in the last 168 hours. ? ?HbA1C: ?Hgb A1c MFr Bld  ?Date/Time Value Ref Range Status  ?05/21/2021 04:27 AM 4.7 (L) 4.8 - 5.6 % Final  ?  Comment:  ?  (NOTE) ?Pre diabetes:  5.7%-6.4% ? ?Diabetes:              >6.4% ? ?Glycemic control for   <7.0% ?adults with diabetes ?  ?11/06/2015 08:31 AM 4.9 4.8 - 5.6 % Final  ?  Comment:  ?  (NOTE) ?        Pre-diabetes: 5.7 - 6.4 ?        Diabetes: >6.4 ?        Glycemic control for adults with diabetes: <7.0 ?  ? ? ?CBG: ?Recent Labs  ?Lab 12/18/21 ?2008  ?GLUCAP 70  ? ? ?Review of Systems:   ?Patient is encephalopathic and/or intubated. Therefore history has been obtained from chart review.  ? ? ?Past Medical History:  ?He,  has a past medical history of Anemia, Arthritis, Chronic combined systolic and diastolic CHF (congestive heart failure) ([place]), Chronic respiratory failure ([place]), DVT (deep venous thrombosis) ([place]), ESRD on hemodialysis [facility]) (June 2016), Essential hypertension, GERD (gastroesophageal reflux disease), History of nuclear stress test, Perinephric hematoma (01/2015), Pneumonia, Proctitis (07/2015), Protein calorie malnutrition ([place]), Pulmonary fibrosis ([place]), Sjogren's disease ([place]) (01/28/2015), Syncope, Unintentional weight loss, and Wears partial dentures.  ? ?Surgical History:  ? ?Past Surgical History:  ?Procedure Laterality Date  ? AV FISTULA PLACEMENT Right 02/17/2015  ? Procedure: INSERTION OF RIGHT ARM  ARTERIOVENOUS (AV) GORE-TEX GRAFT ;  Surgeon: Elam Dutch, MD;   Location: [place];  Service: Vascular;  Laterality: Right;  ? FLEXIBLE SIGMOIDOSCOPY N/A 08/04/2015  ? Procedure: FLEXIBLE SIGMOIDOSCOPY;  Surgeon: Wilford Corner, MD;  Location: [facility] ENDOSCOPY;  Service: En

## 2021-12-18 NOTE — Code Documentation (Signed)
Responded to Code Stroke called on pt already in the ED for headache/vomiting. Code Stroke was initiated on arrival to triage when L sided weakness was noticed. LSN-1300, CBG-116, CT head-negative for acute changes, NIH-2 for LOC questions. SBP-200s in CT. PT brought back to ED room and started on cleviprex for goal SBP-180s. Plan to admit to ICU. ?

## 2021-12-18 NOTE — ED Notes (Signed)
Per Dr. Rory Percy, pt's systolic to remain at 431'V w/ cleviprex ?

## 2021-12-18 NOTE — ED Triage Notes (Signed)
Pt arrive by EMS from home for c/o HA and vomiting that stated today at 1pm after HD.  ?

## 2021-12-18 NOTE — ED Notes (Signed)
Pt arrive by EMS with left side deficit, AO x 2 pr EMS HA started at 13:00 today after HD, no family present on triage to ask LSW was only information available by EMS is when HA started, on arrival pt has unequal pupils, left arm and leg weakness, AO x2. Code stroke activated by PA on triage, this RN paged on the radio the code stroke, roomed pt on room 7, called CN with no answer at the moment, called secretary at [phone] to activate stroke and called CT to get pt to CT. Pt taken by tech to room 20 and RN notified CT is ready for the pt on CT-3.  ?

## 2021-12-18 NOTE — Consult Note (Signed)
Neurology Consultation ? ?Reason for Consult: code stroke - headache, nausea, vomiting, left sided weakness ?Referring Physician: Dr. Jeanell Sparrow ? ?CC: Headache, nausea, vomiting, left-sided weakness ? ?History is obtained from: Chart, patient, wife ? ?HPI: Travis [name] is a 81 y.o. male past medical history of ESRD on dialysis, pulmonary fibrosis, anemia, hypertension, Sjogren's disease, presented to the emergency room for evaluation of feeling generally unwell, headache, nausea and vomiting.  He went to dialysis and was last seen normal at 1 PM.  He was noted to have some left-sided weakness and headache with unclear time of onset.  Family was at bedside but could not provide a clear timeline or even a clear description of his symptoms.  Most concerning symptom to both the patient and the family was headache. ?He was evaluated in the ER triage and due to the concern for unilateral weakness along with headache, code stroke was activated. ?Blood sugar was reported to be normal by fingerstick ?Systolic blood pressures in the 230s ? ?LKW: 1300 hrs today ?tpa given?: no, outside the window ?Premorbid modified Rankin scale (mRS): 2-3 ? ? ?ROS: Unable to obtain due to altered mental status.  ? ?Past Medical History:  ?Diagnosis Date  ? Anemia   ? Arthritis   ? Chronic combined systolic and diastolic CHF (congestive heart failure) ([place])   ? a. Etiology of low EF not defined.  ? Chronic respiratory failure ([place])   ? DVT (deep venous thrombosis) ([place])   ? a. 01/2015: RLE DVT. VQ scan intermediate probability. Underwent renal bx complicated by perinephric hematoma; anticoagulation stopped and IVC filter placed.  ? ESRD on hemodialysis [facility]) June 2016  ? a. 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 Received plasma exchange and steroids but didn't respond and ended up starting hemodialysis June 2016.   ? Essential hypertension   ? GERD (gastroesophageal reflux disease)   ? History of nuclear  stress test   ? Myoview 9/19 [facility]): low risk   ? Perinephric hematoma 01/2015  ? Pneumonia   ? 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 Protein calorie malnutrition ([place])   ? Pulmonary fibrosis ([place])   ? Sjogren's disease ([place]) 01/28/2015  ? Syncope   ? a. 04/2015 in HD in setting of BP 50s.  ? Unintentional weight loss   ? Wears partial dentures   ? ?Family History  ?Problem Relation Age of Onset  ? Hypertension Mother   ? Healthy Father   ? Hypertension Sister   ? Hypertension Brother   ? ?Social History:  ? reports that he has never smoked. He has never used smokeless tobacco. He reports that he does not drink alcohol and does not use drugs. ? ?Medications ? ?Current Facility-Administered Medications:  ?  clevidipine (CLEVIPREX) infusion 0.5 mg/mL, 0-21 mg/hr, Intravenous, Continuous, Pattricia Boss, MD, Last Rate: 32 mL/hr at 12/18/21 2053, 16 mg/hr at 12/18/21 2053 ? ?Current Outpatient Medications:  ?  Accu-Chek Softclix Lancets lancets, Use to test blood glucose four times daily as directed., Disp: 100 each, Rfl: 1 ?  amLODipine (NORVASC) 10 MG tablet, Take 1 tablet (10 mg total) by mouth daily., Disp: 30 tablet, Rfl: 0 ?  aspirin EC 81 MG EC tablet, Take 1 tablet (81 mg total) by mouth daily., Disp: 30 tablet, Rfl: 0 ?  atorvastatin (LIPITOR) 40 MG tablet, Take 1 tablet (40 mg total) by mouth daily at 6 PM., Disp: 30 tablet, Rfl: 0 ?  Blood Glucose Monitoring Suppl (BLOOD GLUCOSE MONITOR  SYSTEM) w/Device KIT, Use up to four times daily as directed., Disp: 1 kit, Rfl: 1 ?  cyanocobalamin 500 MCG tablet, Take 1 tablet (500 mcg total) by mouth daily., Disp: 30 tablet, Rfl: 0 ?  dorzolamide-timolol (COSOPT) 22.3-6.8 MG/ML ophthalmic solution, Place 1 drop into both eyes 2 (two) times daily., Disp: , Rfl:  ?  gabapentin (NEURONTIN) 300 MG capsule, Take 300 mg by mouth every evening., Disp: , Rfl:  ?  glucose blood test strip, Use to test blood glucose four times daily as directed, Disp: 100 each, Rfl: 1 ?  latanoprost  (XALATAN) 0.005 % ophthalmic solution, Place 1 drop into both eyes at bedtime., Disp: , Rfl:  ?  omeprazole (PRILOSEC) 20 MG capsule, Take 20 mg by mouth daily. , Disp: , Rfl: 11 ?  OXYGEN, Inhale 2 L into the lungs daily., Disp: , Rfl:  ?  predniSONE (DELTASONE) 20 MG tablet, Take 1 tablet (20 mg total) by mouth 2 (two) times daily with a meal., Disp: 20 tablet, Rfl: 0 ?  sevelamer carbonate (RENVELA) 800 MG tablet, Take 2 tablets (1,600 mg total) by mouth 3 (three) times daily with meals., Disp: 90 tablet, Rfl: 0 ? ? ?Exam: ?Current vital signs: ?BP (!) 195/79   Pulse 67   Temp 98.2 ?F (36.8 ?C)   Resp 17   Ht $R'5\' 10"'Ku$  (1.778 m)   Wt 77.9 kg   SpO2 99%   BMI 24.64 kg/m?  ?Vital signs in last 24 hours: ?Temp:  [98.2 ?F (36.8 ?C)] 98.2 ?F (36.8 ?C) (04/07 2009) ?Pulse Rate:  [67] 67 (04/07 2009) ?Resp:  [14-20] 17 (04/07 2050) ?BP: (195-217)/(79-94) 195/79 (04/07 2050) ?SpO2:  [99 %] 99 % (04/07 2009) ?Weight:  [77.9 kg] 77.9 kg (04/07 2007) ?General: Patient is awake, alert, in some distress due to headache ?HEENT: Normocephalic atraumatic ?CVS: Regular rate rhythm, extremely hypertensive ?Respiratory: Breathing well saturating normally on room air ?Abdomen nondistended nontender ?Extremities warm well perfused ?Neurological exam ?He is awake alert oriented to self. ?Could not get me his correct age or month. ?Is able to follow commands ?Is able to name simple objects ?Repetition is intact ?Speech is mildly dysarthric ?Poor attention concentration ?Cranial nerves: Pupils equal round reactive light, extract movements intact, visual fields full, face appears symmetric, tongue is midline. ?Motor examination: He is antigravity in both upper extremities with maybe some questionable right upper extremity drift along with pain in the right upper extremity.  Bilateral lower extremities are symmetrically weak but are antigravity ?Sensory exam: Intact to touch ?Coordination cannot be assessed given his mentation ?-  Stroke scale ?1a Level of Conscious.: 0 ?1b LOC Questions: 2 ?1c LOC Commands: 0 ?2 Best Gaze: 0 ?3 Visual: 0 ?4 Facial Palsy: 0 ?5a Motor Arm - left: 0 ?5b Motor Arm - Right: 1 ?6a Motor Leg - Left: 2 ?6b Motor Leg - Right: 2 ?7 Limb Ataxia: 0 ?8 Sensory: 0 ?9 Best Language: 0 ?10 Dysarthria: 1 ?11 Extinct. and Inatten.: 0 ?TOTAL: 8 ? ?Labs ?I have reviewed labs in epic and the results pertinent to this consultation are: ? ?CBC ?   ?Component Value Date/Time  ? WBC 7.5 12/18/2021 2011  ? RBC 4.07 (L) 12/18/2021 2011  ? HGB 14.6 12/18/2021 2018  ? HCT 43.0 12/18/2021 2018  ? PLT 185 12/18/2021 2011  ? MCV 98.0 12/18/2021 2011  ? MCH 30.5 12/18/2021 2011  ? MCHC 31.1 12/18/2021 2011  ? RDW 16.9 (H) 12/18/2021 2011  ? LYMPHSABS 1.5 12/18/2021  2011  ? MONOABS 1.4 (H) 12/18/2021 2011  ? EOSABS 0.2 12/18/2021 2011  ? BASOSABS 0.1 12/18/2021 2011  ? ?CMP  ?   ?Component Value Date/Time  ? NA 138 12/18/2021 2018  ? K 4.3 12/18/2021 2018  ? CL 94 (L) 12/18/2021 2018  ? CO2 34 (H) 12/18/2021 2011  ? GLUCOSE 114 (H) 12/18/2021 2018  ? BUN 17 12/18/2021 2018  ? CREATININE 7.10 (H) 12/18/2021 2018  ? CREATININE 8.33 (H) 09/09/2016 1551  ? CALCIUM 9.3 12/18/2021 2011  ? PROT 8.5 (H) 12/18/2021 2011  ? ALBUMIN 4.2 12/18/2021 2011  ? AST 15 12/18/2021 2011  ? ALT 9 12/18/2021 2011  ? ALKPHOS 103 12/18/2021 2011  ? BILITOT 0.4 12/18/2021 2011  ? GFRNONAA 7 (L) 12/18/2021 2011  ? GFRAA 10 (L) 11/07/2015 1215  ? ? ?Imaging ?I have reviewed the images obtained: ?CT-head-code stroke: ASPECTS 10, chronic small vessel disease, no bleed. ? ?Assessment:  ?81 year old man with past medical history as documented above presenting for evaluation of severe headache, nausea vomiting and a questionable left-sided weakness reported by the staff at dialysis with last known well at 1 PM. ?On my examination, he is weak in both lower extremities, somewhat encephalopathic and may be questionably somewhat weak in the right upper extremity. ?His overall  presentation is more concerning for hypertensive emergency given the systolics in the 901Q along with nausea and vomiting.  Head CT is negative for acute bleed or evolving infarct. ?Stroke, will need an MRI to be ru

## 2021-12-19 ENCOUNTER — Encounter (HOSPITAL_COMMUNITY): Payer: Self-pay | Admitting: Pulmonary Disease

## 2021-12-19 DIAGNOSIS — I161 Hypertensive emergency: Secondary | ICD-10-CM | POA: Diagnosis not present

## 2021-12-19 DIAGNOSIS — R519 Headache, unspecified: Secondary | ICD-10-CM | POA: Insufficient documentation

## 2021-12-19 DIAGNOSIS — N186 End stage renal disease: Secondary | ICD-10-CM | POA: Insufficient documentation

## 2021-12-19 DIAGNOSIS — G9340 Encephalopathy, unspecified: Secondary | ICD-10-CM | POA: Insufficient documentation

## 2021-12-19 LAB — CBC
HCT: 39.1 % (ref 39.0–52.0)
HCT: 40.5 % (ref 39.0–52.0)
Hemoglobin: 12.3 g/dL — ABNORMAL LOW (ref 13.0–17.0)
Hemoglobin: 12.8 g/dL — ABNORMAL LOW (ref 13.0–17.0)
MCH: 30.7 pg (ref 26.0–34.0)
MCH: 30.6 pg (ref 26.0–34.0)
MCHC: 31.5 g/dL (ref 30.0–36.0)
MCHC: 31.6 g/dL (ref 30.0–36.0)
MCV: 97.5 fL (ref 80.0–100.0)
MCV: 96.9 fL (ref 80.0–100.0)
Platelets: 162 10*3/uL (ref 150–400)
Platelets: 184 10*3/uL (ref 150–400)
RBC: 4.01 MIL/uL — ABNORMAL LOW (ref 4.22–5.81)
RBC: 4.18 MIL/uL — ABNORMAL LOW (ref 4.22–5.81)
RDW: 16.9 % — ABNORMAL HIGH (ref 11.5–15.5)
RDW: 17.1 % — ABNORMAL HIGH (ref 11.5–15.5)
WBC: 10 10*3/uL (ref 4.0–10.5)
WBC: 10.2 10*3/uL (ref 4.0–10.5)
nRBC: 0 % (ref 0.0–0.2)
nRBC: 0 % (ref 0.0–0.2)

## 2021-12-19 LAB — BASIC METABOLIC PANEL
Anion gap: 16 — ABNORMAL HIGH (ref 5–15)
BUN: 21 mg/dL (ref 8–23)
CO2: 29 mmol/L (ref 22–32)
Calcium: 9.5 mg/dL (ref 8.9–10.3)
Chloride: 93 mmol/L — ABNORMAL LOW (ref 98–111)
Creatinine, Ser: 8.41 mg/dL — ABNORMAL HIGH (ref 0.61–1.24)
GFR, Estimated: 6 mL/min — ABNORMAL LOW (ref >60)
Glucose, Bld: 92 mg/dL (ref 70–99)
Potassium: 5.5 mmol/L — ABNORMAL HIGH (ref 3.5–5.1)
Sodium: 138 mmol/L (ref 135–145)

## 2021-12-19 LAB — CREATININE, SERUM
Creatinine, Ser: 7.67 mg/dL — ABNORMAL HIGH (ref 0.61–1.24)
GFR, Estimated: 7 mL/min — ABNORMAL LOW (ref >60)

## 2021-12-19 LAB — ACETAMINOPHEN LEVEL: Acetaminophen (Tylenol), Serum: <10 ug/mL — ABNORMAL LOW (ref 10–30)

## 2021-12-19 LAB — SALICYLATE LEVEL: Salicylate Lvl: <7 mg/dL — ABNORMAL LOW (ref 7.0–30.0)

## 2021-12-19 LAB — MRSA NEXT GEN BY PCR, NASAL: MRSA by PCR Next Gen: NOT DETECTED

## 2021-12-19 LAB — AMMONIA: Ammonia: 14 umol/L (ref 9–35)

## 2021-12-19 LAB — MAGNESIUM: Magnesium: 2.1 mg/dL (ref 1.7–2.4)

## 2021-12-19 MED ORDER — CINACALCET HCL 30 MG PO TABS
90.0000 mg | ORAL_TABLET | ORAL | Status: DC
  Administered 2021-12-21 – 2021-12-25 (×3): 90 mg via ORAL
  Filled 2021-12-19 (×3): qty 3

## 2021-12-19 MED ORDER — VITAMIN B-12 1000 MCG PO TABS
500.0000 ug | ORAL_TABLET | Freq: Every day | ORAL | Status: DC
  Administered 2021-12-19 – 2021-12-25 (×7): 500 ug via ORAL
  Filled 2021-12-19 (×8): qty 1

## 2021-12-19 MED ORDER — AMLODIPINE BESYLATE 10 MG PO TABS
10.0000 mg | ORAL_TABLET | Freq: Every day | ORAL | Status: DC
  Administered 2021-12-19 – 2021-12-24 (×6): 10 mg via ORAL
  Filled 2021-12-19 (×8): qty 1

## 2021-12-19 MED ORDER — BRINZOLAMIDE 1 % OP SUSP
1.0000 [drp] | Freq: Three times a day (TID) | OPHTHALMIC | Status: DC
  Administered 2021-12-19 – 2021-12-25 (×17): 1 [drp] via OPHTHALMIC
  Filled 2021-12-19 (×2): qty 10

## 2021-12-19 MED ORDER — CHLORHEXIDINE GLUCONATE CLOTH 2 % EX PADS
6.0000 | MEDICATED_PAD | Freq: Every day | CUTANEOUS | Status: DC
  Administered 2021-12-19 – 2021-12-25 (×7): 6 via TOPICAL

## 2021-12-19 MED ORDER — SEVELAMER CARBONATE 800 MG PO TABS
1600.0000 mg | ORAL_TABLET | Freq: Three times a day (TID) | ORAL | Status: DC
  Administered 2021-12-19 – 2021-12-25 (×15): 1600 mg via ORAL
  Filled 2021-12-19 (×16): qty 2

## 2021-12-19 MED ORDER — ASPIRIN EC 81 MG PO TBEC
81.0000 mg | DELAYED_RELEASE_TABLET | Freq: Every day | ORAL | Status: DC
  Administered 2021-12-19 – 2021-12-25 (×7): 81 mg via ORAL
  Filled 2021-12-19 (×7): qty 1

## 2021-12-19 MED ORDER — LATANOPROST 0.005 % OP SOLN
1.0000 [drp] | Freq: Every day | OPHTHALMIC | Status: DC
  Administered 2021-12-20 – 2021-12-24 (×5): 1 [drp] via OPHTHALMIC
  Filled 2021-12-19 (×2): qty 2.5

## 2021-12-19 MED ORDER — CALCITRIOL 0.5 MCG PO CAPS
3.2500 ug | ORAL_CAPSULE | ORAL | Status: DC
  Administered 2021-12-21 – 2021-12-25 (×3): 3.25 ug via ORAL
  Filled 2021-12-19 (×3): qty 1

## 2021-12-19 MED ORDER — SODIUM CHLORIDE 0.9 % IV SOLN: 62.5000 mg | INTRAVENOUS | Status: DC

## 2021-12-19 MED ORDER — BRIMONIDINE TARTRATE 0.2 % OP SOLN
1.0000 [drp] | Freq: Three times a day (TID) | OPHTHALMIC | Status: DC
  Administered 2021-12-19 – 2021-12-25 (×17): 1 [drp] via OPHTHALMIC
  Filled 2021-12-19 (×2): qty 5

## 2021-12-19 MED ORDER — PANTOPRAZOLE SODIUM 40 MG PO TBEC
40.0000 mg | DELAYED_RELEASE_TABLET | Freq: Every day | ORAL | Status: DC
  Administered 2021-12-21: 40 mg via ORAL

## 2021-12-19 MED ORDER — TIMOLOL MALEATE 0.5 % OP SOLG
1.0000 [drp] | Freq: Every day | OPHTHALMIC | Status: DC
  Administered 2021-12-20 – 2021-12-25 (×5): 1 [drp] via OPHTHALMIC
  Filled 2021-12-19 (×2): qty 5

## 2021-12-19 MED ORDER — ATORVASTATIN CALCIUM 40 MG PO TABS
40.0000 mg | ORAL_TABLET | Freq: Every day | ORAL | Status: DC
  Administered 2021-12-19 – 2021-12-24 (×6): 40 mg via ORAL
  Filled 2021-12-19 (×6): qty 1

## 2021-12-19 NOTE — H&P (Signed)
? ?NAME:  Travis [name], MRN:  [id number], DOB:  1940-10-03, LOS: 1 ?ADMISSION DATE:  12/18/2021, CONSULTATION DATE:  4/7 ?REFERRING MD:  Dr. Jeanell Sparrow, CHIEF COMPLAINT:  hypertensive emergency  ? ?History of Present Illness:  ?Patient is a 81 year old male with pertinent PMH of ESRD on dialysis (received dialysis today on 4/7), pulmonary fibrosis, chronic combined systolic/diastolic CHF, anemia, HTN, Sjogren's disease presents to [facility] ED with HA, N/V. ? ?Patient went to dialysis today around 1 PM on 4/7.  When patient came home from dialysis, he started having L sided weakness with headache.  Family unable to give a specific time of onset.  LKN 1300.  Patient brought to [facility] ED. ? ?Upon arrival to [facility] ED on 4/7, patient still with left sided weakness and headache. Patient also with AMS. Patient also complaining of nausea/vomiting.  Code stroke activated.  Neuro consulted.  SBP in the 230s.  CT head negative for acute findings.  MRI ordered.  Patient started on Cleviprex.  CBG 70.  Patient afebrile.  COVID flu negative.  Ethanol WNL.  WBC 7.5, Hgb 12.4, creat 7.11.  CXR pending. PCCM consulted for ICU admission. ? ?Pertinent  Medical History  ? ?Past Medical History:  ?Diagnosis Date  ? Anemia   ? Arthritis   ? Chronic combined systolic and diastolic CHF (congestive heart failure) ([place])   ? a. Etiology of low EF not defined.  ? Chronic respiratory failure ([place])   ? DVT (deep venous thrombosis) ([place])   ? a. 01/2015: RLE DVT. VQ scan intermediate probability. Underwent renal bx complicated by perinephric hematoma; anticoagulation stopped and IVC filter placed.  ? ESRD on hemodialysis [facility]) June 2016  ? a. 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 Received plasma exchange and steroids but didn't respond and ended up starting hemodialysis June 2016.   ? Essential hypertension   ? GERD (gastroesophageal reflux disease)   ? History of nuclear stress test   ? Myoview 9/19 [facility]):  low risk   ? Perinephric hematoma 01/2015  ? Pneumonia   ? 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 Protein calorie malnutrition ([place])   ? Pulmonary fibrosis ([place])   ? Sjogren's disease ([place]) 01/28/2015  ? Syncope   ? a. 04/2015 in HD in setting of BP 50s.  ? Unintentional weight loss   ? Wears partial dentures   ?' ? ? ?Significant Hospital Events: ?Including procedures, antibiotic start and stop dates in addition to other pertinent events   ?4/7: Ad[facility] hypertensive emergency; concern for stroke but negative; started on Cleviprex ? ?Interim History / Subjective:  ?Patient confused; able to state name but not able to answer any other questions ?On Cleviprex with SBP around 180 ? ?Objective   ?Blood pressure (!) 158/55, pulse 76, temperature 99.6 ?F (37.6 ?C), temperature source Oral, resp. rate 19, height 5\' 10"  (1.778 m), weight 77.9 kg, SpO2 99 %. ?   ?   ? ?Intake/Output Summary (Last 24 hours) at 12/19/2021 1010 ?Last data filed at 12/19/2021 0900 ?Gross per 24 hour  ?Intake 93.25 ml  ?Output --  ?Net 93.25 ml  ? ?Filed Weights  ? 12/18/21 2007  ?Weight: 77.9 kg  ? ? ?Examination: ?General: Confused encephalopathic male ?HEENT: MM pink/moist no JVD ?Neuro: Does not follow commands brought ?CV: Heart sounds are regular ?PULM: Diminished throughout ? ? ?GI: soft, bsx4 active  ?GU: ?Extremities: warm/dry,  edema right forearm AV fistula positive bruit and thrill ?Skin: no rashes  or lesions ? ? ? ?Resolved Hospital Problem list   ? ? ?Assessment & Plan:  ? ?Hypertensive emergency ?P: ?ICU admission ?Manage blood pressure ? ? ? ?-Will admit to ICU with continuous telemetry monitoring ?-Continue Cleviprex for SBP goal less than 180 ?-We will hold on starting home Norvasc for now ? ?Acute encephalopathy: Code stroke called for L sided weakness with headache; CT head negative for acute findings ?HA ?P: ?Appreciate neurological input ?Drug screen ?Sedation limit ?Frequent neurochecks ?MRI of the head ? ? ? ? ?ESRD on dialysis MWF: Last  received dialysis on 4/7 ?P: ?Renal consult ?Electrolyte monitoring ? ? ? ? ? ? ?Chronic respiratory failure ?Pulmonary fibrosis:  2 LNC at home ?P: ?-continue home o2 2L [place] ?-sat goal >92% ?-duoneb prn for wheezing ? ?Sjogren syndrome ?P: ?Continue home prednisone 20 mg daily ?May need Solu-Cortef if hypotensive ? ?Nausea/vomiting ?P: ?An antiemetic ? ?Anemia ?P: ?-trend cbc ? ?Chronic combined systolic/diastolic HF ?HTN/HLD ?P: ?Cleviprex as needed ?Resume aspirin and statin when able to take p.o. ?Weight intake and output ? ? ?- ? ?GERD ?P: ?Proton pump inhibitor ? ?Best Practice (right click and "Reselect all SmartList Selections" daily)  ? ?Diet/type: NPO ?DVT prophylaxis: prophylactic heparin  ?GI prophylaxis: PPI ?Lines: N/A ?Foley:  N/A ?Code Status:  full code ?Last date of multidisciplinary goals of care discussion [4/7 spoke with wife and daughter at bedside and they state that they want him to be full code] ? ?Labs   ?CBC: ?Recent Labs  ?Lab 12/18/21 ?2011 12/18/21 ?2018 12/19/21 ?5277  ?WBC 7.5  --  10.0  ?NEUTROABS 4.3  --   --   ?HGB 12.4* 14.6 12.3*  ?HCT 39.9 43.0 39.1  ?MCV 98.0  --  97.5  ?PLT 185  --  162  ? ? ?Basic Metabolic Panel: ?Recent Labs  ?Lab 12/18/21 ?2011 12/18/21 ?2018 12/19/21 ?8242  ?NA 138 138  --   ?K 4.3 4.3  --   ?CL 92* 94*  --   ?CO2 34*  --   --   ?GLUCOSE 116* 114*  --   ?BUN 15 17  --   ?CREATININE 7.11* 7.10* 7.67*  ?CALCIUM 9.3  --   --   ?MG  --   --  2.1  ? ?GFR: ?Estimated Creatinine Clearance: 7.9 mL/min (A) (by C-G formula based on SCr of 7.67 mg/dL (H)). ?Recent Labs  ?Lab 12/18/21 ?2011 12/19/21 ?3536  ?WBC 7.5 10.0  ? ? ?Liver Function Tests: ?Recent Labs  ?Lab 12/18/21 ?2011  ?AST 15  ?ALT 9  ?ALKPHOS 103  ?BILITOT 0.4  ?PROT 8.5*  ?ALBUMIN 4.2  ? ?No results for input(s): LIPASE, AMYLASE in the last 168 hours. ?Recent Labs  ?Lab 12/19/21 ?1443  ?AMMONIA 14  ? ? ?ABG ?   ?Component Value Date/Time  ? PHART 7.499 (H) 04/19/2015 0534  ? PCO2ART 28.3 (L) 04/19/2015  0534  ? PO2ART 41.0 (L) 04/19/2015 0534  ? HCO3 32.3 (H) 05/20/2021 1341  ? HCO3 32.3 (H) 05/20/2021 1341  ? TCO2 35 (H) 12/18/2021 2018  ? O2SAT 50.0 05/20/2021 1341  ? O2SAT 50.0 05/20/2021 1341  ?  ? ?Coagulation Profile: ?Recent Labs  ?Lab 12/18/21 ?2011  ?INR 1.0  ? ? ?Cardiac Enzymes: ?No results for input(s): CKTOTAL, CKMB, CKMBINDEX, TROPONINI in the last 168 hours. ? ?HbA1C: ?Hgb A1c MFr Bld  ?Date/Time Value Ref Range Status  ?05/21/2021 04:27 AM 4.7 (L) 4.8 - 5.6 % Final  ?  Comment:  ?  (  NOTE) ?Pre diabetes:          5.7%-6.4% ? ?Diabetes:              >6.4% ? ?Glycemic control for   <7.0% ?adults with diabetes ?  ?11/06/2015 08:31 AM 4.9 4.8 - 5.6 % Final  ?  Comment:  ?  (NOTE) ?        Pre-diabetes: 5.7 - 6.4 ?        Diabetes: >6.4 ?        Glycemic control for adults with diabetes: <7.0 ?  ? ? ?CBG: ?Recent Labs  ?Lab 12/18/21 ?2008  ?GLUCAP 70  ? ? ? ? ?Critical care time: 25 minutes ?  ?Richardson Landry Abdurahman Rugg ACNP ?Acute Care Nurse Practitioner ?[place] ?Please consult Amion ?12/19/2021, 10:10 AM ? ? ?  ?

## 2021-12-19 NOTE — Consult Note (Addendum)
Renal Service ?Consult Note ?[facility] ? ?[name] ?12/19/2021 ?Sol Blazing, MD ?Requesting Physician: Dr Lynetta Mare ? ?Reason for Consult: ESRD pt w/ uncont HTN ?HPI: The patient is a 81 y.o. year-old w/ hx of combined CHF, chronic resp failure, DVT, ESRD on HD, HTN, Pulm fibrosis, dementia presented to ED w/ c/o HA and vomiting < 24 hrs onset. In ED BP was up in the 230s. Had HD Friday am. Seen by CCM and admitted to ICU and started on IV Cleviprex. CT head negative. Home po meds resumed here and BP's better today. Weaning down the Cleviprex. Asked to see for ESRD.   ? ?Pt seen in ICU.  Pt is verbally hostile and does not permit exam or answer questions. ? ? ?ROS - n/a ? ? ?Past Medical History  ?Past Medical History:  ?Diagnosis Date  ? Anemia   ? Arthritis   ? Chronic combined systolic and diastolic CHF (congestive heart failure) ([place])   ? a. Etiology of low EF not defined.  ? Chronic respiratory failure ([place])   ? DVT (deep venous thrombosis) ([place])   ? a. 01/2015: RLE DVT. VQ scan intermediate probability. Underwent renal bx complicated by perinephric hematoma; anticoagulation stopped and IVC filter placed.  ? ESRD on hemodialysis [facility]) June 2016  ? a. 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 Received plasma exchange and steroids but didn't respond and ended up starting hemodialysis June 2016.   ? Essential hypertension   ? GERD (gastroesophageal reflux disease)   ? History of nuclear stress test   ? Myoview 9/19 [facility]): low risk   ? Perinephric hematoma 01/2015  ? Pneumonia   ? 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 Protein calorie malnutrition ([place])   ? Pulmonary fibrosis ([place])   ? Sjogren's disease ([place]) 01/28/2015  ? Syncope   ? a. 04/2015 in HD in setting of BP 50s.  ? Unintentional weight loss   ? Wears partial dentures   ? ?Past Surgical History  ?Past Surgical History:  ?Procedure Laterality Date  ? AV FISTULA PLACEMENT Right 02/17/2015  ? Procedure:  INSERTION OF RIGHT ARM  ARTERIOVENOUS (AV) GORE-TEX GRAFT ;  Surgeon: Elam Dutch, MD;  Location: [place];  Service: Vascular;  Laterality: Right;  ? FLEXIBLE SIGMOIDOSCOPY N/A 08/04/2015  ? Procedure: FLEXIBLE SIGMOIDOSCOPY;  Surgeon: Wilford Corner, MD;  Location: [facility] END[facility];  Service: Endoscopy;  Laterality: N/A;  ? HEMORROIDECTOMY  1999  ? INSERTION OF DIALYSIS CATHETER N/A 02/17/2015  ? Procedure: INSERTION OF DIALYSIS CATHETER RIGHT INTERNAL JUGULAR VEIN;  Surgeon: Elam Dutch, MD;  Location: [place];  Service: Vascular;  Laterality: N/A;  ? MULTIPLE TOOTH EXTRACTIONS    ? PERIPHERAL VASCULAR BALLOON ANGIOPLASTY Right 01/23/2019  ? Procedure: PERIPHERAL VASCULAR BALLOON ANGIOPLASTY;  Surgeon: Elam Dutch, MD;  Location: [place];  Service: Cardiovascular;  Laterality: Right;  ? REVISION OF ARTERIOVENOUS GORETEX GRAFT Right 03/29/2017  ? Procedure: REVISION OF ARTERIOVENOUS GORETEX GRAFT;  Surgeon: Conrad [place], MD;  Location[facility];  Service: Vascular;  Laterality: Right;  ? Surgical procedure to remove a mole as a child Right Eye area  ? At around 71 years old  ? ?Family History  ?Family History  ?Problem Relation Age of Onset  ? Hypertension Mother   ? Healthy Father   ? Hypertension Sister   ? Hypertension Brother   ? ?Social History  reports that he has never smoked. He has never  used smokeless tobacco. He reports that he does not drink alcohol and does not use drugs. ?Allergies No Active Allergies ?Home medications ?Prior to Admission medications   ?Medication Sig Start Date End Date Taking? Authorizing Provider  ?aspirin EC 81 MG EC tablet Take 1 tablet (81 mg total) by mouth daily. 04/23/15  Yes Reyne Dumas, MD  ?atorvastatin (LIPITOR) 40 MG tablet Take 1 tablet (40 mg total) by mouth daily at 6 PM. 04/23/15  Yes Reyne Dumas, MD  ?Brinzolamide-Brimonidine 1-0.2 % SUSP Place 1 drop into both eyes in the morning, at noon, and at bedtime. 11/03/21  Yes [provider]   ?cyanocobalamin 500 MCG tablet Take 1 tablet (500 mcg total) by mouth daily. 12/26/14  Yes Donne Hazel, MD  ?latanoprost (XALATAN) 0.005 % ophthalmic solution Place 1 drop into both eyes at bedtime. 05/05/21  Yes [provider]  ?omeprazole (PRILOSEC) 40 MG capsule Take 40 mg by mouth daily. 07/31/15  Yes [provider]  ?timolol (TIMOPTIC-XR) 0.5 % ophthalmic gel-forming Place 1 drop into both eyes daily. 11/03/21  Yes [provider]  ?Accu-Chek Softclix Lancets lancets Use to test blood glucose four times daily as directed. 05/26/21   Thurnell Lose, MD  ?amLODipine (NORVASC) 10 MG tablet Take 1 tablet (10 mg total) by mouth daily. ?Patient not taking: Reported on 12/19/2021 05/26/21   Thurnell Lose, MD  ?Blood Glucose Monitoring Suppl (BLOOD GLUCOSE MONITOR SYSTEM) w/Device KIT Use up to four times daily as directed. 05/26/21   Thurnell Lose, MD  ?glucose blood test strip Use to test blood glucose four times daily as directed 05/26/21   Thurnell Lose, MD  ?OXYGEN Inhale 2 L into the lungs daily.    [provider]  ?predniSONE (DELTASONE) 20 MG tablet Take 1 tablet (20 mg total) by mouth 2 (two) times daily with a meal. ?Patient not taking: Reported on 12/19/2021 05/26/21   Thurnell Lose, MD  ?sevelamer carbonate (RENVELA) 800 MG tablet Take 2 tablets (1,600 mg total) by mouth 3 (three) times daily with meals. ?Patient not taking: Reported on 12/19/2021 05/17/15   Cherene Altes, MD  ? ? ? ?Vitals:  ? 12/19/21 1415 12/19/21 1430 12/19/21 1445 12/19/21 1500  ?BP: (!) 157/50 (!) 162/55 (!) 161/68 (!) 164/144  ?Pulse: 69 71 73 73  ?Resp: 19 (!) 21    ?Temp:      ?TempSrc:      ?SpO2: 98% 99% 99% 98%  ?Weight:      ?Height:      ? ?Exam ?Gen no distress ?No jvd or bruits ?Chest clear ant/ lat, will not cooperate ?RRR no RG ?Abd soft ntnd no mass or ascites +bs ?MS no joint effusions or deformity ?Ext no LE or UE edema, no wounds or ulcers ?Neuro is disoriented, but  won't answer questions, verbally hostile ?   RUA AVG+bruit ? ? ? ? ? Home meds include - asa, lipitor, prilosec, norvasc, O2, renvela 2 ac tid, prns/ vits/ supps ? ? ? ? OP HD: [facility] ? 3.5h  400/1.5   73.2kg  2/2 bath  RUA AVG ? - Hb 10.9 on 4/05 ? - mircera 50 q2, last 4/05, due 4/19 ? - venofer 50 weekly ? - sensipar 90 po tiw ? - rocaltrol 3.25 ug po tiw ? ? ?Assessment/ Plan: ?HTN'sive emergency - BP 230s on presentation, rx'd w/ IV Cleviprex and home po BP meds.  BP's better today 130/ 80 range, weaning  gtt. Per primary team.  ?AMS - CT head neg, neuro following ?ESRD - on HD MWF. No need for urgent HD, will follow. Next HD Monday most likely.  ?Hyperkalemia - need renal diet if eating, and lokelma. ?Volume - no vol excess on exam.  ?Chron resp failure/ hx IPF - 2L [place] at home ?Combined CHF - no specific Rx ?Anemia ckd - Hb good, next esa due on 4/19 ?MBD ckd - CCa in range, add on phos. Cont vdra, sensipar and binders.  ?Dementia - pt hostile and disoriented ?  ? ? ? ?Kelly Splinter  MD ?12/19/2021, 3:48 PM ?Recent Labs  ?Lab 12/18/21 ?2011 12/18/21 ?2018 12/19/21 ?1595 12/19/21 ?3967  ?HGB 12.4* 14.6 12.3* 12.8*  ?ALBUMIN 4.2  --   --   --   ?CALCIUM 9.3  --   --  9.5  ?CREATININE 7.11* 7.10* 7.67* 8.41*  ?K 4.3 4.3  --  5.5*  ? ? ?

## 2021-12-19 NOTE — Evaluation (Addendum)
Clinical/Bedside Swallow Evaluation ?Patient Details  ?Name: Travis [name] ?MRN: [id number] ?Date of Birth: [birth year] ? ?Today's Date: 12/19/2021 ?Time: SLP Start Time (ACUTE ONLY): 1740 SLP Stop Time (ACUTE ONLY): 8144 ?SLP Time Calculation (min) (ACUTE ONLY): 15 min ? ?Past Medical History:  ?Past Medical History:  ?Diagnosis Date  ? Anemia   ? Arthritis   ? Chronic combined systolic and diastolic CHF (congestive heart failure) ([place])   ? a. Etiology of low EF not defined.  ? Chronic respiratory failure ([place])   ? DVT (deep venous thrombosis) ([place])   ? a. 01/2015: RLE DVT. VQ scan intermediate probability. Underwent renal bx complicated by perinephric hematoma; anticoagulation stopped and IVC filter placed.  ? ESRD on hemodialysis [facility]) June 2016  ? a. 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 Received plasma exchange and steroids but didn't respond and ended up starting hemodialysis June 2016.   ? Essential hypertension   ? GERD (gastroesophageal reflux disease)   ? History of nuclear stress test   ? Myoview 9/19 [facility]): low risk   ? Perinephric hematoma 01/2015  ? Pneumonia   ? 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 Protein calorie malnutrition ([place])   ? Pulmonary fibrosis ([place])   ? Sjogren's disease ([place]) 01/28/2015  ? Syncope   ? a. 04/2015 in HD in setting of BP 50s.  ? Unintentional weight loss   ? Wears partial dentures   ? ?Past Surgical History:  ?Past Surgical History:  ?Procedure Laterality Date  ? AV FISTULA PLACEMENT Right 02/17/2015  ? Procedure: INSERTION OF RIGHT ARM  ARTERIOVENOUS (AV) GORE-TEX GRAFT ;  Surgeon: Elam Dutch, MD;  Location: [place];  Service: Vascular;  Laterality: Right;  ? FLEXIBLE SIGMOIDOSCOPY N/A 08/04/2015  ? Procedure: FLEXIBLE SIGMOIDOSCOPY;  Surgeon: Wilford Corner, MD;  Location: [facility] ENDOSCOPY;  Service: Endoscopy;  Laterality: N/A;  ? HEMORROIDECTOMY  1999  ? INSERTION OF DIALYSIS CATHETER N/A 02/17/2015  ? Procedure: INSERTION OF DIALYSIS  CATHETER RIGHT INTERNAL JUGULAR VEIN;  Surgeon: Elam Dutch, MD;  Location: [place];  Service: Vascular;  Laterality: N/A;  ? MULTIPLE TOOTH EXTRACTIONS    ? PERIPHERAL VASCULAR BALLOON ANGIOPLASTY Right 01/23/2019  ? Procedure: PERIPHERAL VASCULAR BALLOON ANGIOPLASTY;  Surgeon: Elam Dutch, MD;  Location: [place] CV LAB;  Service: Cardiovascular;  Laterality: Right;  ? REVISION OF ARTERIOVENOUS GORETEX GRAFT Right 03/29/2017  ? Procedure: REVISION OF ARTERIOVENOUS GORETEX GRAFT;  Surgeon: Conrad Cypress, MD;  Location: [facility] OR;  Service: Vascular;  Laterality: Right;  ? Surgical procedure to remove a mole as a child Right Eye area  ? At around 14 years old  ? ?HPI:  ?81 year old male presented to [place] ED with HA, N/V, left sided weakness, mental status changes. Code stroke activated. CT negative for acute findings. MRI pending.  Pertinent PMH of ESRD on dialysis (received dialysis 4/7), pulmonary fibrosis, chronic combined systolic/diastolic CHF, anemia, HTN, Sjogren's disease, GERD.  ?  ?Assessment / Plan / Recommendation  ?Clinical Impression ? Despite pt's confusion, he participated easily in bedside swallowing assessment, followed commands intermittently, and became more interactive as session progressed. Mr. Wiemers demonstrated active attention to and mastication of regular solids. His swallow response appeared to be brisk.  There were no s/s of aspiration, even when taxed with mixed solid/liquid consistencies and sequential sips of water. Recommend resuming a regular diet with thin liquids. He may need supervision with meals initially and assist with feeding due to his confusion,  but he appears to be protecting his airway well. No SLP f/u needed. ?SLP Visit Diagnosis: Dysphagia, unspecified (R13.10) ?   ?Aspiration Risk ? No limitations  ?  ?Diet Recommendation   Renal diet (Regular solids, thin liquids) ? ?Medication Administration: Whole meds with puree  ?  ?Other  Recommendations Oral Care  Recommendations: Oral care BID   ? ?Recommendations for follow up therapy are one component of a multi-disciplinary discharge planning process, led by the attending physician.  Recommendations may be updated based on patient status, additional functional criteria and insurance authorization. ? ?Follow up Recommendations No SLP follow up  ? ? ?  ? ? ?Swallow Study   ?General Date of Onset: 12/18/21 ?HPI: 81 year old male presented to [facility] ED with HA, N/V, left sided weakness, mental status changes. Code stroke activated. CT negative for acute findings. MRI pending.  Pertinent PMH of ESRD on dialysis (received dialysis 4/7), pulmonary fibrosis, chronic combined systolic/diastolic CHF, anemia, HTN, Sjogren's disease, GERD. ?Type of Study: Bedside Swallow Evaluation ?Previous Swallow Assessment: 2016 ?Diet Prior to this Study: NPO ?Temperature Spikes Noted: No ?Respiratory Status: Room air ?History of Recent Intubation: No ?Behavior/Cognition: Alert;Cooperative;Confused ?Oral Cavity Assessment: Within Functional Limits ?Oral Care Completed by SLP: Recent completion by staff ?Oral Cavity - Dentition: Dentures, top ?Vision: Functional for self-feeding ?Self-Feeding Abilities: Needs assist ?Patient Positioning: Upright in bed ?Baseline Vocal Quality: Normal ?Volitional Cough: Strong  ?  ?Oral/Motor/Sensory Function Overall Oral Motor/Sensory Function: Within functional limits   ?Ice Chips Ice chips: Within functional limits   ?Thin Liquid Thin Liquid: Within functional limits  ?  ?Nectar Thick Nectar Thick Liquid: Not tested   ?Honey Thick Honey Thick Liquid: Not tested   ?Puree Puree: Within functional limits   ?Solid ? ? ?  Solid: Within functional limits  ? ?  ? ?Juan Quam Laurice ?12/19/2021,9:34 AM ? ?Johnye Kist L. Saadia Dewitt, MA CCC/SLP ?Acute Rehabilitation Services ?Office number [phone] ?Pager [phone] ? ? ?

## 2021-12-20 ENCOUNTER — Encounter (HOSPITAL_COMMUNITY): Payer: Self-pay | Admitting: Pulmonary Disease

## 2021-12-20 ENCOUNTER — Inpatient Hospital Stay (HOSPITAL_COMMUNITY): Payer: Medicare Other

## 2021-12-20 DIAGNOSIS — G9341 Metabolic encephalopathy: Secondary | ICD-10-CM | POA: Diagnosis present

## 2021-12-20 DIAGNOSIS — I161 Hypertensive emergency: Secondary | ICD-10-CM | POA: Diagnosis not present

## 2021-12-20 DIAGNOSIS — I674 Hypertensive encephalopathy: Secondary | ICD-10-CM | POA: Diagnosis present

## 2021-12-20 LAB — CBC WITH DIFFERENTIAL/PLATELET
Abs Immature Granulocytes: 0.03 10*3/uL (ref 0.00–0.07)
Basophils Absolute: 0 10*3/uL (ref 0.0–0.1)
Basophils Relative: 0 %
Eosinophils Absolute: 0 10*3/uL (ref 0.0–0.5)
Eosinophils Relative: 0 %
HCT: 39.1 % (ref 39.0–52.0)
Hemoglobin: 12.6 g/dL — ABNORMAL LOW (ref 13.0–17.0)
Immature Granulocytes: 0 %
Lymphocytes Relative: 13 %
Lymphs Abs: 1 10*3/uL (ref 0.7–4.0)
MCH: 30.4 pg (ref 26.0–34.0)
MCHC: 32.2 g/dL (ref 30.0–36.0)
MCV: 94.2 fL (ref 80.0–100.0)
Monocytes Absolute: 1 10*3/uL (ref 0.1–1.0)
Monocytes Relative: 13 %
Neutro Abs: 5.8 10*3/uL (ref 1.7–7.7)
Neutrophils Relative %: 74 %
Platelets: 195 10*3/uL (ref 150–400)
RBC: 4.15 MIL/uL — ABNORMAL LOW (ref 4.22–5.81)
RDW: 16.9 % — ABNORMAL HIGH (ref 11.5–15.5)
WBC: 7.9 10*3/uL (ref 4.0–10.5)
nRBC: 0 % (ref 0.0–0.2)

## 2021-12-20 LAB — TRIGLYCERIDES: Triglycerides: 96 mg/dL (ref <150)

## 2021-12-20 LAB — HEPATITIS B SURFACE ANTIBODY,QUALITATIVE: Hep B S Ab: REACTIVE — AB

## 2021-12-20 LAB — BASIC METABOLIC PANEL
Anion gap: 14 (ref 5–15)
BUN: 30 mg/dL — ABNORMAL HIGH (ref 8–23)
CO2: 31 mmol/L (ref 22–32)
Calcium: 9.8 mg/dL (ref 8.9–10.3)
Chloride: 91 mmol/L — ABNORMAL LOW (ref 98–111)
Creatinine, Ser: 10.47 mg/dL — ABNORMAL HIGH (ref 0.61–1.24)
GFR, Estimated: 5 mL/min — ABNORMAL LOW (ref >60)
Glucose, Bld: 122 mg/dL — ABNORMAL HIGH (ref 70–99)
Potassium: 5.2 mmol/L — ABNORMAL HIGH (ref 3.5–5.1)
Sodium: 136 mmol/L (ref 135–145)

## 2021-12-20 LAB — MAGNESIUM: Magnesium: 2.3 mg/dL (ref 1.7–2.4)

## 2021-12-20 LAB — HEPATITIS B SURFACE ANTIGEN: Hepatitis B Surface Ag: NONREACTIVE

## 2021-12-20 LAB — PHOSPHORUS: Phosphorus: 7 mg/dL — ABNORMAL HIGH (ref 2.5–4.6)

## 2021-12-20 MED ORDER — CARVEDILOL 6.25 MG PO TABS
6.2500 mg | ORAL_TABLET | Freq: Two times a day (BID) | ORAL | Status: DC
  Administered 2021-12-20 – 2021-12-24 (×7): 6.25 mg via ORAL
  Filled 2021-12-20 (×7): qty 1

## 2021-12-20 MED ORDER — CLEVIDIPINE BUTYRATE 0.5 MG/ML IV EMUL
0.0000 mg/h | INTRAVENOUS | Status: DC
  Administered 2021-12-20: 9 mg/h via INTRAVENOUS
  Filled 2021-12-20 (×3): qty 100

## 2021-12-20 NOTE — H&P (Addendum)
? ?NAME:  Travis [name], MRN:  [id number], DOB:  Dec 07, 1940, LOS: 2 ?ADMISSION DATE:  12/18/2021, CONSULTATION DATE:  4/7 ?REFERRING MD:  Dr. Jeanell Sparrow, CHIEF COMPLAINT:  hypertensive emergency  ? ?History of Present Illness:  ?Patient is a 81 year old male with pertinent PMH of ESRD on dialysis (received dialysis today on 4/7), pulmonary fibrosis, chronic combined systolic/diastolic CHF, anemia, HTN, Sjogren's disease presents to [facility] ED with HA, N/V. ? ?Patient went to dialysis today around 1 PM on 4/7.  When patient came home from dialysis, he started having L sided weakness with headache.  Family unable to give a specific time of onset.  LKN 1300.  Patient brought to [facility] ED. ? ?Upon arrival to [facility] ED on 4/7, patient still with left sided weakness and headache. Patient also with AMS. Patient also complaining of nausea/vomiting.  Code stroke activated.  Neuro consulted.  SBP in the 230s.  CT head negative for acute findings.  MRI ordered.  Patient started on Cleviprex.  CBG 70.  Patient afebrile.  COVID flu negative.  Ethanol WNL.  WBC 7.5, Hgb 12.4, creat 7.11.  CXR pending. PCCM consulted for ICU admission. ? ?Pertinent  Medical History  ? ?Past Medical History:  ?Diagnosis Date  ? Acute on chronic respiratory failure with hypoxia ([place]) 01/28/2015  ? Anemia   ? Arthritis   ? Chronic combined systolic and diastolic CHF (congestive heart failure) ([place])   ? a. Etiology of low EF not defined.  ? Chronic respiratory failure ([place])   ? DVT (deep venous thrombosis) ([place])   ? a. 01/2015: RLE DVT. VQ scan intermediate probability. Underwent renal bx complicated by perinephric hematoma; anticoagulation stopped and IVC filter placed.  ? ESRD on hemodialysis [facility]) June 2016  ? a. 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 Received plasma exchange and steroids but didn't respond and ended up starting hemodialysis June 2016.   ? Essential hypertension   ? GERD (gastroesophageal reflux disease)   ?  History of nuclear stress test   ? Myoview 9/19 [facility]): low risk   ? NSTEMI (non-ST elevated myocardial infarction) ([place]) 04/18/2015  ? Perinephric hematoma 01/2015  ? Pneumonia   ? 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 Followed in Pulmonary clinic/ [facility]/ [place]  - onset of symptoms 11/3014 with Pos RA/esr 70 12/18/14   ? 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 Protein calorie malnutrition ([place])   ? Pulmonary fibrosis ([place])   ? Sjogren's disease ([place]) 01/28/2015  ? Steroid-induced hyperglycemia 01/06/2015  ? Syncope   ? a. 04/2015 in HD in setting of BP 50s.  ? Unintentional weight loss   ? Wears partial dentures   ?' ? ? ?Significant Hospital Events: ?Including procedures, antibiotic start and stop dates in addition to other pertinent events   ?4/7: Admitted to [facility] hypertensive emergency; concern for stroke but negative; started on Cleviprex ?4/9 more awake and oriented, but still on cleviprex ? ?Interim History / Subjective:  ? ?Mental status has improved today.  He is awake and communicative.  He denies any pain or shortness of breath.  He remains on Cleviprex after restarting his home amlodipine. ? ?Objective   ?Blood pressure (!) 147/76, pulse 63, temperature 98 ?F (36.7 ?C), temperature source Oral, resp. rate 13, height $RemoveBe'5\' 10"'tguBmouCO$  (1.778 m), weight 77.9 kg, SpO2 100 %. ?   ?   ? ?Intake/Output Summary (Last 24 hours) at 12/20/2021 1522 ?Last data filed at 12/20/2021 1400 ?Gross per 24 hour  ?  Intake 730.15 ml  ?Output --  ?Net 730.15 ml  ? ? ?Filed Weights  ? 12/18/21 2007  ?Weight: 77.9 kg  ? ?Examination: ?General: Frail appearing man lying in bed ?HEENT: MM pink/moist no JVD, S for present.   ?Neuro: Awake tracks and answers questions.  Knows where he is.  Some insight into condition.  No focal deficits ?CV: 3/6 systolic ejection murmur heard in the sash distribution. ?PULM: Clear throughout. ?GI: soft, bsx4 active, no bruit ?Extremities: warm/dry,  edema right forearm AV fistula positive bruit and  thrill ?Skin: no rashes or lesions ? ?Ancillary tests personally reviewed:  ?Creatinine today 10.4, potassium 5.2 ?Prior ECHO  shows normal LV function with mild aortic regurgitation and stenosis. ?Assessment & Plan:  ? ?Principal Problem: ?  Hypertensive emergency ?Active Problems: ?  Hypertensive encephalopathy ?  ESRD on dialysis [facility]) ?  Protein-calorie malnutrition, severe ([place]) ?  FTT (failure to thrive) in adult ?  ?Plan: ? ?-Titrate Cleviprex to keep systolic blood pressure 536-468.  Continue amlodipine.  Have started carvedilol 12.5 twice daily ?-For hemodialysis tomorrow on his schedule today. ?-Encephalopathy appears to be resolving, may be at baseline. ?-PT OT assessment, nutrition consult. ? ? ?Best Practice (right click and "Reselect all SmartList Selections" daily)  ? ?Diet/type: NPO swallow evaluation and advance diet. ?DVT prophylaxis: prophylactic heparin  ?GI prophylaxis: PPI ?Lines: N/A ?Foley:  N/A ?Code Status:  full code ?Last date of multidisciplinary goals of care discussion [4/7 spoke with wife and daughter at bedside and they state that they want him to be full code] ? ?CRITICAL CARE ?Performed by: Kipp Brood ? ? ?Total critical care time: 30 minutes ? ?Critical care time was exclusive of separately billable procedures and treating other patients. ? ?Critical care was necessary to treat or prevent imminent or life-threatening deterioration. ? ?Critical care was time spent personally by me on the following activities: development of treatment plan with patient and/or surrogate as well as nursing, discussions with consultants, evaluation of patient's response to treatment, examination of patient, obtaining history from patient or surrogate, ordering and performing treatments and interventions, ordering and review of laboratory studies, ordering and review of radiographic studies, pulse oximetry, re-evaluation of patient's condition and participation in multidisciplinary rounds. ? ?Kipp Brood, MD FRCPC ?ICU Physician ?[place]  ?Pager: [phone] ?Mobile: [phone] ?After hours: [phone]. ? ? ?12/20/2021, 3:22 PM ? ? ?  ?

## 2021-12-20 NOTE — Progress Notes (Signed)
?[facility] ?Progress Note  ? ?Subjective:  ?Seen in ICU. Remains confused, but pleasant this am. MS improved from arrival per RN.  ?BP improved. Weaning Cleviprex. Denies cp, dyspnea.  ? ?Objective ?Vitals:  ? 12/20/21 0828 12/20/21 0900 12/20/21 1000 12/20/21 1100  ?BP:  136/81 (!) 151/74 (!) 148/58  ?Pulse:  72 67 62  ?Resp:  17 15 16   ?Temp: 98 ?F (36.7 ?C)     ?TempSrc: Oral     ?SpO2:  97% 97% 99%  ?Weight:      ?Height:      ?  ? ? ?Additional Objective ?Labs: ?Basic Metabolic Panel: ?Recent Labs  ?Lab 12/18/21 ?2011 12/18/21 ?2018 12/19/21 ?4431 12/19/21 ?5400 12/20/21 ?0615  ?NA 138 138  --  138 136  ?K 4.3 4.3  --  5.5* 5.2*  ?CL 92* 94*  --  93* 91*  ?CO2 34*  --   --  29 31  ?GLUCOSE 116* 114*  --  92 122*  ?BUN 15 17  --  21 30*  ?CREATININE 7.11* 7.10* 7.67* 8.41* 10.47*  ?CALCIUM 9.3  --   --  9.5 9.8  ?PHOS  --   --   --   --  7.0*  ? ?CBC: ?Recent Labs  ?Lab 12/18/21 ?2011 12/18/21 ?2018 12/19/21 ?8676 12/19/21 ?1950 12/20/21 ?0615  ?WBC 7.5  --  10.0 10.2 7.9  ?NEUTROABS 4.3  --   --   --  5.8  ?HGB 12.4*   < > 12.3* 12.8* 12.6*  ?HCT 39.9   < > 39.1 40.5 39.1  ?MCV 98.0  --  97.5 96.9 94.2  ?PLT 185  --  162 184 195  ? < > = values in this interval not displayed.  ? ?Blood Culture ?   ?Component Value Date/Time  ? SDES BLOOD LEFT ARM 05/20/2021 1310  ? SPECREQUEST  05/20/2021 1310  ?  BOTTLES DRAWN AEROBIC AND ANAEROBIC Blood Culture results may not be optimal due to an inadequate volume of blood received in culture bottles  ? CULT  05/20/2021 1310  ?  NO GROWTH 5 DAYS ?Performed at [facility], [place] [address], [place], [place] [address] ?  ? REPTSTATUS 05/25/2021 FINAL 05/20/2021 1310  ? ? ? ?Physical Exam ?General: Elderly man, nad  ?Heart: RRR +SEM  ?Lungs: Clear bilaterally  ?Abdomen: soft non-tender  ?Extremities: No LE edema  ?Dialysis Access: RUE AVG +bruit  ? ?Medications: ? clevidipine 9 mg/hr (12/20/21 1138)  ? [START ON 12/23/2021] ferric gluconate (FERRLECIT)  IVPB    ? ? amLODipine  10 mg Oral Daily  ? aspirin EC  81 mg Oral Daily  ? atorvastatin  40 mg Oral q1800  ? brinzolamide  1 drop Both Eyes TID  ? And  ? brimonidine  1 drop Both Eyes TID  ? [START ON 12/21/2021] calcitRIOL  3.25 mcg Oral Q M,W,F-HD  ? Chlorhexidine Gluconate Cloth  6 each Topical Daily  ? [START ON 12/21/2021] cinacalcet  90 mg Oral Q M,W,F-HD  ? heparin  5,000 Units Subcutaneous Q8H  ? latanoprost  1 drop Both Eyes QHS  ? pantoprazole  40 mg Oral Daily  ? pantoprazole  40 mg Oral Daily  ? predniSONE  20 mg Oral BID WC  ? sevelamer carbonate  1,600 mg Oral TID WC  ? timolol  1 drop Both Eyes Daily  ? vitamin B-12  500 mcg Oral Daily  ? ? ? OP HD: [facility] ? 3.5h  400/1.5   73.2kg  2/2 bath  RUA AVG ? - Hb 10.9 on 4/05 ? - mircera 50 q2, last 4/05, due 4/19 ? - venofer 50 weekly ? - sensipar 90 po tiw ? - rocaltrol 3.25 ug po tiw ?  ?  ?Assessment/ Plan: ?HTN'sive emergency - BP 230s on presentation, rx'd w/ IV Cleviprex and home po BP meds.  BP's better today 130/ 80 range, weaning gtt. Per primary team.  ?AMS - CT head neg, neuro following ?ESRD - on HD MWF. No need for urgent HD, will follow. Next HD Monday.  ?Hyperkalemia - need renal diet if eating, and lokelma. K+ 5.2  ?Volume - no vol excess on exam.  ?Chron resp failure/ hx IPF - 2L [place] at home ?Combined CHF - no specific Rx ?Anemia ckd - Hb >12. No esa needs  ?MBD ckd - CCa in range, add on phos. Cont vdra, sensipar and binders.  ?Dementia - at baseline. Agitation improved today  ? ?Lynnda Child PA-C ?[facility] ?12/20/2021,12:36 PM ? ? ? ? ? ? ? ?

## 2021-12-21 DIAGNOSIS — E875 Hyperkalemia: Secondary | ICD-10-CM | POA: Diagnosis not present

## 2021-12-21 DIAGNOSIS — I10 Essential (primary) hypertension: Secondary | ICD-10-CM

## 2021-12-21 DIAGNOSIS — N186 End stage renal disease: Secondary | ICD-10-CM | POA: Diagnosis not present

## 2021-12-21 DIAGNOSIS — G9340 Encephalopathy, unspecified: Secondary | ICD-10-CM | POA: Diagnosis not present

## 2021-12-21 LAB — CBC
HCT: 36.4 % — ABNORMAL LOW (ref 39.0–52.0)
Hemoglobin: 11.5 g/dL — ABNORMAL LOW (ref 13.0–17.0)
MCH: 29.8 pg (ref 26.0–34.0)
MCHC: 31.6 g/dL (ref 30.0–36.0)
MCV: 94.3 fL (ref 80.0–100.0)
Platelets: 200 10*3/uL (ref 150–400)
RBC: 3.86 MIL/uL — ABNORMAL LOW (ref 4.22–5.81)
RDW: 17.2 % — ABNORMAL HIGH (ref 11.5–15.5)
WBC: 7.9 10*3/uL (ref 4.0–10.5)
nRBC: 0.3 % — ABNORMAL HIGH (ref 0.0–0.2)

## 2021-12-21 LAB — RENAL FUNCTION PANEL
Albumin: 3.5 g/dL (ref 3.5–5.0)
Anion gap: 15 (ref 5–15)
BUN: 46 mg/dL — ABNORMAL HIGH (ref 8–23)
CO2: 31 mmol/L (ref 22–32)
Calcium: 9.2 mg/dL (ref 8.9–10.3)
Chloride: 89 mmol/L — ABNORMAL LOW (ref 98–111)
Creatinine, Ser: 12.75 mg/dL — ABNORMAL HIGH (ref 0.61–1.24)
GFR, Estimated: 4 mL/min — ABNORMAL LOW (ref >60)
Glucose, Bld: 123 mg/dL — ABNORMAL HIGH (ref 70–99)
Phosphorus: 8 mg/dL — ABNORMAL HIGH (ref 2.5–4.6)
Potassium: 5.7 mmol/L — ABNORMAL HIGH (ref 3.5–5.1)
Sodium: 135 mmol/L (ref 135–145)

## 2021-12-21 MED ORDER — CLONIDINE HCL 0.1 MG PO TABS: 0.1000 mg | ORAL_TABLET | Freq: Four times a day (QID) | ORAL | Status: DC | PRN

## 2021-12-21 MED ORDER — HYDRALAZINE HCL 20 MG/ML IJ SOLN
10.0000 mg | Freq: Four times a day (QID) | INTRAMUSCULAR | Status: DC | PRN
  Administered 2021-12-21 – 2021-12-25 (×2): 10 mg via INTRAVENOUS
  Filled 2021-12-21 (×2): qty 1

## 2021-12-21 MED ORDER — SODIUM ZIRCONIUM CYCLOSILICATE 10 G PO PACK
10.0000 g | PACK | Freq: Three times a day (TID) | ORAL | Status: DC
  Administered 2021-12-21 (×3): 10 g via ORAL
  Filled 2021-12-21 (×5): qty 1

## 2021-12-21 NOTE — Evaluation (Signed)
Occupational Therapy Evaluation ?Patient Details ?Name: Travis [name] ?MRN: [id number] ?DOB: 1941/03/22 ?Today's Date: 12/21/2021 ? ? ?History of Present Illness Patient is a 81 y/o male who presents on 12/18/21 with HA, AMS, vomiting and left sided weakness. Code stroke initiated. NIH:2. Head CT-negative. MRI-pending. Admitted with HTN crisis. 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  ? ?Clinical Impression ?  ?Pt lives with his wife, walks with a RW and is independent in self care. He stands to shower. He does not drive. No family available to determine prior cognition level, but pt admits to impaired memory and demonstrates this during assessment in addition to slow processing and disorientation to situation. Pt presents with impaired balance and generalized weakness. He requires up to min assist for OOB and set up to min assist for ADLs. Recommending HHOT.  ?   ? ?Recommendations for follow up therapy are one component of a multi-disciplinary discharge planning process, led by the attending physician.  Recommendations may be updated based on patient status, additional functional criteria and insurance authorization.  ? ?Follow Up Recommendations ? Home health OT  ?  ?Assistance Recommended at Discharge Frequent or constant Supervision/Assistance  ?Patient can return home with the following A little help with walking and/or transfers;A little help with bathing/dressing/bathroom;Assistance with cooking/housework;Direct supervision/assist for medications management;Direct supervision/assist for financial management;Assist for transportation;Help with stairs or ramp for entrance ? ?  ?Functional Status Assessment ? Patient has had a recent decline in their functional status and demonstrates the ability to make significant improvements in function in a reasonable and predictable amount of time.  ?Equipment  Recommendations ? Tub/shower seat (pt will get through [place])  ?  ?Recommendations for Other Services   ? ? ?  ?Precautions / Restrictions Precautions ?Precautions: Fall ?Precaution Comments: SBP goal 140-160 ?Restrictions ?Weight Bearing Restrictions: No  ? ?  ? ?Mobility Bed Mobility ?Overal bed mobility: Needs Assistance ?Bed Mobility: Supine to Sit, Sit to Supine ?  ?  ?Supine to sit: Supervision, HOB elevated ?Sit to supine: Supervision, HOB elevated ?  ?General bed mobility comments: increased time, no assist needed. ?  ? ?Transfers ?Overall transfer level: Needs assistance ?Equipment used: Rolling walker (2 wheels) ?Transfers: Sit to/from Stand ?Sit to Stand: Min assist ?  ?  ?  ?  ?  ?General transfer comment: assist to rise and steady, cues for hand placement ?  ? ?  ?Balance Overall balance assessment: Needs assistance ?Sitting-balance support: Feet supported, No upper extremity supported ?Sitting balance-Leahy Scale: Good ?  ?  ?Standing balance support: During functional activity, Reliant on assistive device for balance ?  ?Standing balance comment: Reliant on RW, min assist at sink when releasing walker ?  ?  ?  ?  ?  ?  ?  ?  ?  ?  ?  ?   ? ?ADL either performed or assessed with clinical judgement  ? ?ADL Overall ADL's : Needs assistance/impaired ?Eating/Feeding: Set up;Sitting ?  ?Grooming: Minimal assistance;Standing;Wash/dry hands ?  ?Upper Body Bathing: Set up;Sitting ?  ?Lower Body Bathing: Minimal assistance;Sit to/from stand ?  ?Upper Body Dressing : Set up;Sitting ?  ?Lower Body Dressing: Minimal assistance;Sit to/from stand ?  ?Toilet Transfer: Minimal assistance;Ambulation;Rolling walker (2 wheels) ?  ?Toileting- Clothing Manipulation and Hygiene: Minimal assistance;Sit to/from stand ?  ?  ?  ?Functional mobility during ADLs: Minimal assistance;Rolling walker (2 wheels) ?   ? ? ? ?Vision  Baseline Vision/History: 0 No visual deficits ?Ability to See in Adequate Light: 0 Adequate ?Patient Visual  Report: No change from baseline ?Additional Comments: reports dry eyes  ?   ?Perception   ?  ?Praxis   ?  ? ?Pertinent Vitals/Pain Pain Assessment ?Pain Assessment: No/denies pain  ? ? ? ?Hand Dominance Left ?  ?Extremity/Trunk Assessment Upper Extremity Assessment ?Upper Extremity Assessment: Overall WFL for tasks assessed ?  ?Lower Extremity Assessment ?Lower Extremity Assessment: Defer to PT evaluation ?  ?Cervical / Trunk Assessment ?Cervical / Trunk Assessment: Kyphotic ?  ?Communication Communication ?Communication: No difficulties ?  ?Cognition Arousal/Alertness: Awake/alert ?Behavior During Therapy: [facility] for tasks assessed/performed ?Overall Cognitive Status: No family/caregiver present to determine baseline cognitive functioning ?Area of Impairment: Memory, Awareness, Orientation, Problem solving, Following commands ?  ?  ?  ?  ?  ?  ?  ?  ?Orientation Level: Disoriented to, Situation ?  ?Memory: Decreased short-term memory ?Following Commands: Follows one step commands with increased time ?  ?Awareness: Intellectual ?Problem Solving: Slow processing, Requires verbal cues, Requires tactile cues ?General Comments: pt reports baseline memory deficits, no family available ?  ?  ?General Comments  VSS on RA. ? ?  ?Exercises   ?  ?Shoulder Instructions    ? ? ?Home Living Family/patient expects to be discharged to:: Private residence ?Living Arrangements: Spouse/significant other ?Available Help at Discharge: Family;Available 24 hours/day ?Type of Home: House ?Home Access: Stairs to enter ?Entrance Stairs-Number of Steps: 4 ?Entrance Stairs-Rails: Right;Left ?Home Layout: One level ?Alternate Level Stairs-Number of Steps: 1 flight ?  ?Bathroom Shower/Tub: Walk-in shower ?  ?Bathroom Toilet: Standard ?  ?  ?Home Equipment: Cane - single point;Rollator (4 wheels);Other (comment) ?  ?Additional Comments: stand up RW ?  ? ?  ?Prior Functioning/Environment Prior Level of Function : Needs assist ?  ?  ?  ?  ?  ?   ?Mobility Comments: Uses RW for ambulation, does not drive. No falls reported. ?ADLs Comments: Independent, stands to shower ?  ? ?  ?  ?OT Problem List: Decreased strength;Impaired balance (sitting and/or standing);Decreased knowledge of use of DME or AE;Decreased cognition;Decreased safety awareness ?  ?   ?OT Treatment/Interventions: Self-care/ADL training;Patient/family education;Balance training;Cognitive remediation/compensation;DME and/or AE instruction  ?  ?OT Goals(Current goals can be found in the care plan section) Acute Rehab OT Goals ?OT Goal Formulation: With patient ?Time For Goal Achievement: 01/04/22 ?Potential to Achieve Goals: Good ?ADL Goals ?Pt Will Perform Grooming: with supervision;standing ?Pt Will Perform Lower Body Bathing: with supervision;sit to/from stand ?Pt Will Perform Lower Body Dressing: with supervision;sit to/from stand ?Pt Will Transfer to Toilet: with supervision;ambulating ?Pt Will Perform Toileting - Clothing Manipulation and hygiene: with supervision;sit to/from stand  ?OT Frequency: Min 2X/week ?  ? ?Co-evaluation   ?  ?  ?  ?  ? ?  ?AM-PAC OT "6 Clicks" Daily Activity     ?Outcome Measure Help from another person eating meals?: None ?Help from another person taking care of personal grooming?: A Little ?Help from another person toileting, which includes using toliet, bedpan, or urinal?: A Little ?Help from another person bathing (including washing, rinsing, drying)?: A Little ?Help from another person to put on and taking off regular upper body clothing?: A Little ?Help from another person to put on and taking off regular lower body clothing?: A Little ?6 Click Score: 19 ?  ?End of Session Equipment Utilized During Treatment: Rolling walker (2 wheels);Gait belt ? ?Activity Tolerance: Patient tolerated treatment  well ?Patient left: in bed;with call bell/phone within reach;with bed alarm set ? ?OT Visit Diagnosis: Other abnormalities of gait and mobility (R26.89);Unsteadiness  on feet (R26.81);Other symptoms and signs involving cognitive function  ?              ?Time: 2409-7353 ?OT Time Calculation (min): 18 min ?Charges:  OT Evaluation ?$OT Eval Moderate Complexity: 1 Mod ? ?Chilton Si

## 2021-12-21 NOTE — Progress Notes (Signed)
?[facility] ?Progress Note  ? ?Subjective:  Off gtts, BP meds much better, per PCCM stable for transfer out of ICU.  For HD today.  K 5.7 ? ?Objective ?Vitals:  ? 12/21/21 1000 12/21/21 1100 12/21/21 1200 12/21/21 1219  ?BP: 138/73 (!) 155/77 (!) 146/79   ?Pulse: (!) 56 (!) 57 62   ?Resp: (!) 22 15 16    ?Temp:    97.6 ?F (36.4 ?C)  ?TempSrc:    Axillary  ?SpO2: 98% 97% 94%   ?Weight:      ?Height:      ?  ? ? ?Additional Objective ?Labs: ?Basic Metabolic Panel: ?Recent Labs  ?Lab 12/19/21 ?0924 12/20/21 ?0615 12/21/21 ?8527  ?NA 138 136 135  ?K 5.5* 5.2* 5.7*  ?CL 93* 91* 89*  ?CO2 29 31 31   ?GLUCOSE 92 122* 123*  ?BUN 21 30* 46*  ?CREATININE 8.41* 10.47* 12.75*  ?CALCIUM 9.5 9.8 9.2  ?PHOS  --  7.0* 8.0*  ? ?CBC: ?Recent Labs  ?Lab 12/18/21 ?2011 12/18/21 ?2018 12/19/21 ?7824 12/19/21 ?2353 12/20/21 ?0615 12/21/21 ?6144  ?WBC 7.5  --  10.0 10.2 7.9 7.9  ?NEUTROABS 4.3  --   --   --  5.8  --   ?HGB 12.4*   < > 12.3* 12.8* 12.6* 11.5*  ?HCT 39.9   < > 39.1 40.5 39.1 36.4*  ?MCV 98.0  --  97.5 96.9 94.2 94.3  ?PLT 185  --  162 184 195 200  ? < > = values in this interval not displayed.  ? ?Blood Culture ?   ?Component Value Date/Time  ? SDES BLOOD LEFT ARM 05/20/2021 1310  ? SPECREQUEST  05/20/2021 1310  ?  BOTTLES DRAWN AEROBIC AND ANAEROBIC Blood Culture results may not be optimal due to an inadequate volume of blood received in culture bottles  ? CULT  05/20/2021 1310  ?  NO GROWTH 5 DAYS ?Performed at [facility], [place] [address], [place], [place] [address] ?  ? REPTSTATUS 05/25/2021 FINAL 05/20/2021 1310  ? ? ? ?Physical Exam ?General: Elderly man, nad  ?Heart: RRR , II/IV systolic murmur ?Lungs: Clear bilaterally, on RA ?Abdomen: soft non-tender  ?Extremities: No LE edema ?Dialysis Access: RUE AVG +bruit  ? ?Medications: ? [START ON 12/23/2021] ferric gluconate (FERRLECIT) IVPB    ? ? amLODipine  10 mg Oral Daily  ? aspirin EC  81 mg Oral Daily  ? atorvastatin  40 mg Oral q1800  ?  brinzolamide  1 drop Both Eyes TID  ? And  ? brimonidine  1 drop Both Eyes TID  ? calcitRIOL  3.25 mcg Oral Q M,W,F-HD  ? carvedilol  6.25 mg Oral BID WC  ? Chlorhexidine Gluconate Cloth  6 each Topical Daily  ? cinacalcet  90 mg Oral Q M,W,F-HD  ? heparin  5,000 Units Subcutaneous Q8H  ? latanoprost  1 drop Both Eyes QHS  ? pantoprazole  40 mg Oral Daily  ? predniSONE  20 mg Oral BID WC  ? sevelamer carbonate  1,600 mg Oral TID WC  ? sodium zirconium cyclosilicate  10 g Oral TID  ? timolol  1 drop Both Eyes Daily  ? vitamin B-12  500 mcg Oral Daily  ? ? ? OP HD: [facility] ? 3.5h  400/1.5   73.2kg  2/2 bath  RUA AVG ? - Hb 10.9 on 4/05 ? - mircera 50 q2, last 4/05, due 4/19 ? - venofer 50 weekly ? - sensipar 90 po  tiw ? - rocaltrol 3.25 ug po tiw ?  ?  ?Assessment/ Plan: ?HTN'sive emergency - BP 230s on presentation, rx'd w/ IV Cleviprex and home po BP meds.  BP's better today 130/ 80 range, off gtt. Per primary team.  ?AMS - CT head neg, neuro following ?ESRD - on HD MWF. HD on schedule 4/10  ?Hyperkalemia - need renal diet if eating.  K 5.7, lokelma TID until HD today (may not be until later this evening d/t staffing challenges) ?Volume - no vol excess on exam.  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 No esa needs  ?MBD ckd - CCa in range, add on phos. Cont vdra, sensipar and binders.  ?Dementia - at baseline. Agitation improved today  ?Dispo: going to be transferred out of ICU today ? ?Madelon Lips MD ?[facility] ?12/21/2021,12:47 PM ? ? ? ? ? ? ? ?

## 2021-12-21 NOTE — Evaluation (Signed)
Physical Therapy Evaluation ?Patient Details ?Name: Travis [name] ?MRN: [id number] ?DOB: 03-25-1941 ?Today's Date: 12/21/2021 ? ?History of Present Illness ? Patient is a 81 y/o male who presents on 12/18/21 with HA, AMS, vomiting and left sided weakness. Code stroke initiated. NIH:2. Head CT-negative. MRI-pending. Admitted with HTN crisis. 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  ?Clinical Impression ? Patient presents with generalized weakness, impaired balance, impaired cognition and impaired mobility s/p above. Pt lives at home with spouse and reports using SPC vs RW for ambulation PTA. Reports being independent for ADLs. Today, pt requires Min A for transfers and gait training with use of RW for support. Noted to have impaired STM, safety, and awareness. VSS on RA throughout. Will follow acutely to maximize independence and mobility prior to return home. ?   ? ?Recommendations for follow up therapy are one component of a multi-disciplinary discharge planning process, led by the attending physician.  Recommendations may be updated based on patient status, additional functional criteria and insurance authorization. ? ?Follow Up Recommendations Home health PT ? ?  ?Assistance Recommended at Discharge Frequent or constant Supervision/Assistance  ?Patient can return home with the following ? A little help with walking and/or transfers;A little help with bathing/dressing/bathroom;Assistance with cooking/housework;Assist for transportation;Help with stairs or ramp for entrance ? ?  ?Equipment Recommendations None recommended by PT  ?Recommendations for Other Services ?    ?  ?Functional Status Assessment Patient has had a recent decline in their functional status and demonstrates the ability to make significant improvements in function in a reasonable and predictable amount of time.  ? ?  ?Precautions /  Restrictions Precautions ?Precautions: Fall;Other (comment) ?Precaution Comments: SBP goal 140-160 ?Restrictions ?Weight Bearing Restrictions: No  ? ?  ? ?Mobility ? Bed Mobility ?Overal bed mobility: Needs Assistance ?Bed Mobility: Supine to Sit ?  ?  ?Supine to sit: Min guard, HOB elevated ?  ?  ?General bed mobility comments: increased time, no assist needed. ?  ? ?Transfers ?Overall transfer level: Needs assistance ?Equipment used: Rolling walker (2 wheels) ?Transfers: Sit to/from Stand ?Sit to Stand: Min assist ?  ?  ?  ?  ?  ?General transfer comment: Min A to power to standing with cues for hand placement/technique. Stood from [employer], transferred to chair post ambulation. ?  ? ?Ambulation/Gait ?Ambulation/Gait assistance: Min assist ?Gait Distance (Feet): 150 Feet ?Assistive device: Rolling walker (2 wheels) ?Gait Pattern/deviations: Step-through pattern, Decreased stride length, Trunk flexed ?Gait velocity: decreased ?Gait velocity interpretation: <1.31 ft/sec, indicative of household ambulator ?  ?General Gait Details: Slow, unsteady gait with pt picking up RW randomly throughout ambulation and slamming it down to change directions, cues for RW proximity. Difficulty with path finding. ? ?Stairs ?  ?  ?  ?  ?  ? ?Wheelchair Mobility ?  ? ?Modified Rankin (Stroke Patients Only) ?  ? ?  ? ?Balance Overall balance assessment: Needs assistance ?Sitting-balance support: Feet supported, No upper extremity supported ?Sitting balance-Leahy Scale: Good ?  ?  ?Standing balance support: During functional activity, Reliant on assistive device for balance ?Standing balance-Leahy Scale: Poor ?Standing balance comment: Reliant on RW ?  ?  ?  ?  ?  ?  ?  ?  ?  ?  ?  ?   ? ? ? ?Pertinent Vitals/Pain Pain Assessment ?Pain Assessment: Faces ?Faces Pain Scale: No hurt  ? ? ?Home Living Family/patient expects to  be discharged to:: Private residence ?Living Arrangements: Spouse/significant other ?Available Help at Discharge:  Family;Available 24 hours/day ?Type of Home: House ?Home Access: Stairs to enter ?Entrance Stairs-Rails: Right;Left ?Entrance Stairs-Number of Steps: 4 ?Alternate Level Stairs-Number of Steps: 1 flight ?Home Layout: One level ?Home Equipment: Cane - single point;Rollator (4 wheels);Other (comment) ?Additional Comments: stand up RW  ?  ?Prior Function Prior Level of Function : Needs assist ?  ?  ?  ?  ?  ?  ?Mobility Comments: Uses RW for ambulation, does not drive. No falls reported. ?ADLs Comments: Independent ?  ? ? ?Hand Dominance  ? Dominant Hand: Left ? ?  ?Extremity/Trunk Assessment  ? Upper Extremity Assessment ?Upper Extremity Assessment: Defer to OT evaluation ?  ? ?Lower Extremity Assessment ?Lower Extremity Assessment: Generalized weakness ?  ? ?Cervical / Trunk Assessment ?Cervical / Trunk Assessment: Kyphotic  ?Communication  ? Communication: No difficulties  ?Cognition Arousal/Alertness: Awake/alert ?Behavior During Therapy: [facility] for tasks assessed/performed ?Overall Cognitive Status: Impaired/Different from baseline ?Area of Impairment: Memory, Awareness, Orientation, Problem solving, Following commands ?  ?  ?  ?  ?  ?  ?  ?  ?Orientation Level: Disoriented to, Situation ?  ?Memory: Decreased short-term memory ?Following Commands: Follows one step commands with increased time (repetition needed) ?  ?Awareness: Intellectual ?Problem Solving: Slow processing, Requires verbal cues, Requires tactile cues ?General Comments: Impaired STM, oriented to self, place and month. ?  ?  ? ?  ?General Comments General comments (skin integrity, edema, etc.): VSS on RA. ? ?  ?Exercises    ? ?Assessment/Plan  ?  ?PT Assessment Patient needs continued PT services  ?PT Problem List Decreased strength;Decreased mobility;Decreased balance;Decreased knowledge of use of DME;Decreased cognition;Decreased activity tolerance;Decreased safety awareness ? ?   ?  ?PT Treatment Interventions Therapeutic exercise;DME  instruction;Patient/family education;Therapeutic activities;Gait training;Balance training;Cognitive remediation;Functional mobility training   ? ?PT Goals (Current goals can be found in the Care Plan section)  ?Acute Rehab PT Goals ?Patient Stated Goal: get up ?PT Goal Formulation: With patient ?Time For Goal Achievement: 01/04/22 ?Potential to Achieve Goals: Fair ? ?  ?Frequency Min 3X/week ?  ? ? ?Co-evaluation   ?  ?  ?  ?  ? ? ?  ?AM-PAC PT "6 Clicks" Mobility  ?Outcome Measure Help needed turning from your back to your side while in a flat bed without using bedrails?: A Little ?Help needed moving from lying on your back to sitting on the side of a flat bed without using bedrails?: A Little ?Help needed moving to and from a bed to a chair (including a wheelchair)?: A Little ?Help needed standing up from a chair using your arms (e.g., wheelchair or bedside chair)?: A Little ?Help needed to walk in hospital room?: A Little ?Help needed climbing 3-5 steps with a railing? : A Little ?6 Click Score: 18 ? ?  ?End of Session Equipment Utilized During Treatment: Gait belt ?Activity Tolerance: Patient tolerated treatment well ?Patient left: in chair;with call bell/phone within reach;with chair alarm set ?Nurse Communication: Mobility status ?PT Visit Diagnosis: Muscle weakness (generalized) (M62.81);Difficulty in walking, not elsewhere classified (R26.2);Unsteadiness on feet (R26.81) ?  ? ?Time: 1202-1230 ?PT Time Calculation (min) (ACUTE ONLY): 28 min ? ? ?Charges:   PT Evaluation ?$PT Eval Moderate Complexity: 1 Mod ?PT Treatments ?$Gait Training: 8-22 mins ?  ?   ? ? ?Marisa Severin, PT, DPT ?Acute Rehabilitation Services ?Secure chat preferred ?Office [phone] ? ? ? ? ?[place] ?12/21/2021, 1:08  PM ? ?

## 2021-12-21 NOTE — Progress Notes (Signed)
? ?NAME:  Travis [name], MRN:  [id number], DOB:  1941-06-08, LOS: 3 ?ADMISSION DATE:  12/18/2021, CONSULTATION DATE:  4/7 ?REFERRING MD:  Dr. Jeanell Sparrow, CHIEF COMPLAINT:  hypertensive emergency  ? ?History of Present Illness:  ?Patient is a 81 year old male with pertinent PMH of ESRD on dialysis (received dialysis today on 4/7), pulmonary fibrosis, chronic combined systolic/diastolic CHF, anemia, HTN, Sjogren's disease presents to [facility] ED with HA, N/V. ? ?Patient went to dialysis today around 1 PM on 4/7.  When patient came home from dialysis, he started having L sided weakness with headache.  Family unable to give a specific time of onset.  LKN 1300.  Patient brought to [facility] ED. ? ?Upon arrival to [facility] ED on 4/7, patient still with left sided weakness and headache. Patient also with AMS. Patient also complaining of nausea/vomiting.  Code stroke activated.  Neuro consulted.  SBP in the 230s.  CT [name] negative for acute findings.  MRI ordered.  Patient started on Cleviprex.  CBG 70.  Patient afebrile.  COVID flu negative.  Ethanol WNL.  WBC 7.5, Hgb 12.4, creat 7.11.  CXR pending. PCCM consulted for ICU admission. ? ?Pertinent  Medical History  ? ?Past Medical History:  ?Diagnosis Date  ? Acute on chronic respiratory failure with hypoxia ([place]) 01/28/2015  ? Anemia   ? Arthritis   ? Chronic combined systolic and diastolic CHF (congestive heart failure) ([place])   ? a. Etiology of low EF not defined.  ? Chronic respiratory failure ([place])   ? DVT (deep venous thrombosis) ([place])   ? a. 01/2015: RLE DVT. VQ scan intermediate probability. Underwent renal bx complicated by perinephric hematoma; anticoagulation stopped and IVC filter placed.  ? ESRD on hemodialysis [facility]) June 2016  ? a. 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 Received plasma exchange and steroids but didn't respond and ended up starting hemodialysis June 2016.   ? Essential hypertension   ? GERD (gastroesophageal reflux disease)   ?  History of nuclear stress test   ? Myoview 9/19 [facility]): low risk   ? NSTEMI (non-ST elevated myocardial infarction) ([place]) 04/18/2015  ? Perinephric hematoma 01/2015  ? Pneumonia   ? 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 Followed in Pulmonary clinic/ [facility]/ [place]  - onset of symptoms 11/3014 with Pos RA/esr 70 12/18/14   ? 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 Protein calorie malnutrition ([place])   ? Pulmonary fibrosis ([place])   ? Sjogren's disease ([place]) 01/28/2015  ? Steroid-induced hyperglycemia 01/06/2015  ? Syncope   ? a. 04/2015 in HD in setting of BP 50s.  ? Unintentional weight loss   ? Wears partial dentures   ?' ? ? ?Significant Hospital Events: ?Including procedures, antibiotic start and stop dates in addition to other pertinent events   ?4/7: Admitted to [facility] hypertensive emergency; concern for stroke but negative; started on Cleviprex ?4/9 more awake and oriented, but still on cleviprex ?4/10 off cleviprex. Transfer out of ICU  ? ?Interim History / Subjective:  ? ?Off cleviprex  ? ?Objective   ?Blood pressure (!) 155/77, pulse (!) 57, temperature 97.6 ?F (36.4 ?C), temperature source Axillary, resp. rate 15, height _0  (1.778 m), weight 77.9 kg, SpO2 97 %. ?   ?   ? ?Intake/Output Summary (Last 24 hours) at 12/21/2021 1222 ?Last data filed at 12/21/2021 0732 ?Gross per 24 hour  ?Intake 318.74 ml  ?Output --  ?Net 318.74 ml  ? ?Filed Weights  ? 12/18/21  2007  ?Weight: 77.9 kg  ? ?Examination: ?General: Frail appearing elderly M NAD  ?HEENT: NCAT pink mm ?Neuro: Awake alert oriented x2 ?CV: Systolic murmur. S1s2  ?PULM: CTAb. Even unlabored on RA  ?GI: soft ndnt  ?Extremities: no acute joint deformity. AVF  ?Skin: c/d/w  ? ?Ancillary tests personally reviewed:  ?Creatinine today 10.4, potassium 5.2 ?Prior ECHO  shows normal LV function with mild aortic regurgitation and stenosis. ?Assessment & Plan:  ? ? ?Hypertensive encephalopathy - improving  ?Hypertensive emergency- improved  ?HTN ?-largely  130-150s overnight ?P ?-stable to transfer out of ICU  ?-cont  norvasc, coreg  -- goal 130-150  ?-due for HD 4/10 -- further changes to schedule antihypertensives pending HD  ? ?ESRD on HD ?P ?-Hd per nephro  ? ?FTT in adult ?Severe protein calorie malnutrition  ?P ?-PT/OT, RDN  ? ? ?Dispo: ?Stable for transfer out of ICU 4/10. Will ask Trh to take over care 4/11  ? ?Best Practice (right click and "Reselect all SmartList Selections" daily)  ? ?Diet/type: renal with 1555m restriction  ?DVT prophylaxis: prophylactic heparin  ?GI prophylaxis: PPI ?Lines: N/A ?Foley:  N/A ?Code Status:  full code ?Last date of multidisciplinary goals of care discussion [4/10 wife and pt updated at bedside ]  ? ?CCT:  n/a ? ?GEliseo GumMSN, AGACNP-BC ?[facility] ?Amion for pager  ?12/21/2021, 12:22 PM ? ? ? ?  ?

## 2021-12-21 NOTE — Progress Notes (Signed)
Patient arrived onto 3E from [facility]. Patient's VS obtained and systolic BP of 798/92. RN notified Carlis Abbott MD of critical care. New orders of prn IV hydralazine and clonidine, second to be given for SBP >160. ?

## 2021-12-21 NOTE — Plan of Care (Signed)
?  Problem: Clinical Measurements: ?Goal: Ability to maintain clinical measurements within normal limits will improve ?Outcome: Progressing ?Goal: Will remain free from infection ?Outcome: Progressing ?Goal: Diagnostic test results will improve ?Outcome: Progressing ?Goal: Respiratory complications will improve ?Outcome: Progressing ?Goal: Cardiovascular complication will be avoided ?Outcome: Progressing ?  ?Problem: Education: ?Goal: Knowledge of General Education information will improve ?Description: Including pain rating scale, medication(s)/side effects and non-pharmacologic comfort measures ?Outcome: Progressing ?  ?Problem: Nutrition: ?Goal: Adequate nutrition will be maintained ?Outcome: Progressing ?  ?

## 2021-12-21 NOTE — TOC Progression Note (Signed)
Transition of Care (TOC) - Progression Note  ? ? ?Patient Details  ?Name: Travis [name] ?MRN: [id number] ?Date of Birth: Jul 31, 1941 ? ?Transition of Care (TOC) CM/SW Contact  ?Zenon Mayo, RN ?Phone Number: ?12/21/2021, 5:49 PM ? ?Clinical Narrative:    ?From home, presents with discomfort, from [facility], for cath today, conts on hep drip, indep, noncompliant with medications. Per pt eval rec HHPT, will need HHRN for medication management. TOC will continue to follow for dc needs.  ? ? ?  ?  ? ?Expected Discharge Plan and Services ?  ?  ?  ?  ?  ?                ?  ?  ?  ?  ?  ?  ?  ?  ?  ?  ? ? ?Social Determinants of Health (SDOH) Interventions ?  ? ?Readmission Risk Interventions ? ?  05/26/2021  ? 12:03 PM 05/26/2021  ? 12:02 PM  ?Readmission Risk Prevention Plan  ?Transportation Screening Complete Complete  ?PCP or Specialist Appt within 3-5 Days Complete   ?[place] or Home Care Consult Complete   ?Social Work Consult for [place] Planning/Counseling Complete   ?Palliative Care Screening Not Applicable   ?Medication Review Press photographer) Complete   ? ? ?

## 2021-12-22 DIAGNOSIS — R410 Disorientation, unspecified: Secondary | ICD-10-CM

## 2021-12-22 LAB — RENAL FUNCTION PANEL
Albumin: 3.5 g/dL (ref 3.5–5.0)
Anion gap: 17 — ABNORMAL HIGH (ref 5–15)
BUN: 68 mg/dL — ABNORMAL HIGH (ref 8–23)
CO2: 27 mmol/L (ref 22–32)
Calcium: 8.1 mg/dL — ABNORMAL LOW (ref 8.9–10.3)
Chloride: 88 mmol/L — ABNORMAL LOW (ref 98–111)
Creatinine, Ser: 14.35 mg/dL — ABNORMAL HIGH (ref 0.61–1.24)
GFR, Estimated: 3 mL/min — ABNORMAL LOW (ref >60)
Glucose, Bld: 101 mg/dL — ABNORMAL HIGH (ref 70–99)
Phosphorus: 7.6 mg/dL — ABNORMAL HIGH (ref 2.5–4.6)
Potassium: 4.6 mmol/L (ref 3.5–5.1)
Sodium: 132 mmol/L — ABNORMAL LOW (ref 135–145)

## 2021-12-22 LAB — CBC
HCT: 35.4 % — ABNORMAL LOW (ref 39.0–52.0)
Hemoglobin: 11.7 g/dL — ABNORMAL LOW (ref 13.0–17.0)
MCH: 30.7 pg (ref 26.0–34.0)
MCHC: 33.1 g/dL (ref 30.0–36.0)
MCV: 92.9 fL (ref 80.0–100.0)
Platelets: 211 10*3/uL (ref 150–400)
RBC: 3.81 MIL/uL — ABNORMAL LOW (ref 4.22–5.81)
RDW: 17.5 % — ABNORMAL HIGH (ref 11.5–15.5)
WBC: 11.9 10*3/uL — ABNORMAL HIGH (ref 4.0–10.5)
nRBC: 0.3 % — ABNORMAL HIGH (ref 0.0–0.2)

## 2021-12-22 LAB — HEPATITIS B SURFACE ANTIBODY, QUANTITATIVE: Hep B S AB Quant (Post): >1000 m[IU]/mL (ref >9.9)

## 2021-12-22 MED ORDER — SODIUM CHLORIDE 0.9 % IV SOLN
62.5000 mg | INTRAVENOUS | Status: AC
  Administered 2021-12-22: 62.5 mg via INTRAVENOUS
  Filled 2021-12-22: qty 5

## 2021-12-22 MED ORDER — ALTEPLASE 2 MG IJ SOLR
2.0000 mg | Freq: Once | INTRAMUSCULAR | Status: DC | PRN
  Filled 2021-12-22: qty 2

## 2021-12-22 MED ORDER — ACETAMINOPHEN 325 MG PO TABS
650.0000 mg | ORAL_TABLET | Freq: Four times a day (QID) | ORAL | Status: DC | PRN
  Administered 2021-12-22: 650 mg via ORAL
  Filled 2021-12-22: qty 2

## 2021-12-22 MED ORDER — ENSURE ENLIVE PO LIQD
237.0000 mL | Freq: Two times a day (BID) | ORAL | Status: DC
  Administered 2021-12-22 – 2021-12-25 (×4): 237 mL via ORAL

## 2021-12-22 MED ORDER — SODIUM CHLORIDE 0.9 % IV SOLN: 62.5000 mg | INTRAVENOUS | Status: DC

## 2021-12-22 MED ORDER — SODIUM CHLORIDE 0.9 % IV SOLN: 100.0000 mL | INTRAVENOUS | Status: DC | PRN

## 2021-12-22 MED ORDER — LIDOCAINE HCL (PF) 1 % IJ SOLN: 5.0000 mL | INTRAMUSCULAR | Status: DC | PRN

## 2021-12-22 MED ORDER — RENA-VITE PO TABS
1.0000 | ORAL_TABLET | Freq: Every day | ORAL | Status: DC
  Administered 2021-12-22 – 2021-12-24 (×3): 1 via ORAL
  Filled 2021-12-22 (×3): qty 1

## 2021-12-22 MED ORDER — LIDOCAINE-PRILOCAINE 2.5-2.5 % EX CREA
1.0000 "application " | TOPICAL_CREAM | CUTANEOUS | Status: DC | PRN
  Filled 2021-12-22: qty 5

## 2021-12-22 MED ORDER — PENTAFLUOROPROP-TETRAFLUOROETH EX AERO: 1.0000 "application " | INHALATION_SPRAY | CUTANEOUS | Status: DC | PRN

## 2021-12-22 MED ORDER — HEPARIN SODIUM (PORCINE) 1000 UNIT/ML DIALYSIS
1000.0000 [IU] | INTRAMUSCULAR | Status: DC | PRN
  Filled 2021-12-22: qty 1

## 2021-12-22 NOTE — Assessment & Plan Note (Signed)
-  HD per nephrology ?-done 4/11 and plan again in AM ?

## 2021-12-22 NOTE — Progress Notes (Signed)
?[facility] ?Progress Note  ? ?Subjective:  Seen and examined on HD.  BFR 400 mL/ min via AVF, UF goal 3L.  Feeling well and has no complaints.  ? ?Objective ?Vitals:  ? 12/22/21 1020 12/22/21 1030 12/22/21 1038 12/22/21 1121  ?BP: (!) 92/49 (!) 95/48 139/65 (!) 160/65  ?Pulse: (!) 59 (!) 57 63   ?Resp:   20 15  ?Temp:   97.8 ?F (36.6 ?C) 97.6 ?F (36.4 ?C)  ?TempSrc:   Oral Oral  ?SpO2:   100% 98%  ?Weight:   71.3 kg   ?Height:      ?  ? ? ?Additional Objective ?Labs: ?Basic Metabolic Panel: ?Recent Labs  ?Lab 12/20/21 ?0615 12/21/21 ?5170 12/22/21 ?0725  ?NA 136 135 132*  ?K 5.2* 5.7* 4.6  ?CL 91* 89* 88*  ?CO2 31 31 27   ?GLUCOSE 122* 123* 101*  ?BUN 30* 46* 68*  ?CREATININE 10.47* 12.75* 14.35*  ?CALCIUM 9.8 9.2 8.1*  ?PHOS 7.0* 8.0* 7.6*  ? ?CBC: ?Recent Labs  ?Lab 12/18/21 ?2011 12/18/21 ?2018 12/19/21 ?0174 12/19/21 ?9449 12/20/21 ?0615 12/21/21 ?6759 12/22/21 ?0724  ?WBC 7.5  --  10.0 10.2 7.9 7.9 11.9*  ?NEUTROABS 4.3  --   --   --  5.8  --   --   ?HGB 12.4*   < > 12.3* 12.8* 12.6* 11.5* 11.7*  ?HCT 39.9   < > 39.1 40.5 39.1 36.4* 35.4*  ?MCV 98.0  --  97.5 96.9 94.2 94.3 92.9  ?PLT 185  --  162 184 195 200 211  ? < > = values in this interval not displayed.  ? ?Blood Culture ?   ?Component Value Date/Time  ? SDES BLOOD LEFT ARM 05/20/2021 1310  ? SPECREQUEST  05/20/2021 1310  ?  BOTTLES DRAWN AEROBIC AND ANAEROBIC Blood Culture results may not be optimal due to an inadequate volume of blood received in culture bottles  ? CULT  05/20/2021 1310  ?  NO GROWTH 5 DAYS ?Performed at [facility], [place] [address] [place], [place] [address] ?  ? REPTSTATUS 05/25/2021 FINAL 05/20/2021 1310  ? ? ? ?Physical Exam ?General: Elderly man, nad  ?Heart: RRR , II/IV systolic murmur ?Lungs: Clear bilaterally, on RA ?Abdomen: soft non-tender  ?Extremities: No LE edema ?Dialysis Access: RUE AVG +bruit  ? ?Medications: ? ferric gluconate (FERRLECIT) IVPB    ? [START ON 12/30/2021] ferric gluconate  (FERRLECIT) IVPB    ? ? amLODipine  10 mg Oral Daily  ? aspirin EC  81 mg Oral Daily  ? atorvastatin  40 mg Oral q1800  ? brinzolamide  1 drop Both Eyes TID  ? And  ? brimonidine  1 drop Both Eyes TID  ? calcitRIOL  3.25 mcg Oral Q M,W,F-HD  ? carvedilol  6.25 mg Oral BID WC  ? Chlorhexidine Gluconate Cloth  6 each Topical Daily  ? cinacalcet  90 mg Oral Q M,W,F-HD  ? heparin  5,000 Units Subcutaneous Q8H  ? latanoprost  1 drop Both Eyes QHS  ? pantoprazole  40 mg Oral Daily  ? sevelamer carbonate  1,600 mg Oral TID WC  ? sodium zirconium cyclosilicate  10 g Oral TID  ? timolol  1 drop Both Eyes Daily  ? vitamin B-12  500 mcg Oral Daily  ? ? ? OP HD: MWF [facility] ? 3.5h  400/1.5   73.2kg  2/2 bath  RUA AVG ? - Hb 10.9 on 4/05 ? - mircera 50 q2, last  4/05, due 4/19 ? - venofer 50 weekly ? - sensipar 90 po tiw ? - rocaltrol 3.25 ug po tiw ?  ?  ?Assessment/ Plan: ?HTN'sive emergency - BP 230s on presentation, rx'd w/ IV Cleviprex and home po BP meds.  BP's better today 130/ 80 range, off gtt. Per primary team.  ?AMS - CT head neg, neuro following ?ESRD - on HD MWF. Bumped from HD scheduled 4/10, off schedule 4/11--> resume normal schedule tomorrow  ?Hyperkalemia - lokelma d/c'd ?Volume - no vol excess on exam.  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 No esa needs  ?MBD ckd - CCa in range, add on phos. Cont vdra, sensipar and binders.  ?Dementia - at baseline. Agitation improved today  ?Dispo: Bps better, had HD- OK to d/c from renal perspective if no other issues ? ?Madelon Lips MD ?[facility] ?12/22/2021,11:48 AM ? ? ? ? ? ? ? ?

## 2021-12-22 NOTE — Progress Notes (Signed)
Initial Nutrition Assessment ? ?DOCUMENTATION CODES:  ? ?Not applicable ? ?INTERVENTION:  ? ?Renal Multivitamin w/ minerals daily ?Ensure Enlive po BID, each supplement provides 350 kcal and 20 grams of protein. ?Liberalize pt diet to regular with 1500 mL fluid restriction due to current diet being very restrictive.  ?Feeding assist with all meals ?Encourage good PO intake  ? ?NUTRITION DIAGNOSIS:  ? ?Increased nutrient needs related to chronic illness (ESRD, CHF) as evidenced by estimated needs. ? ?GOAL:  ? ?Patient will meet greater than or equal to 90% of their needs ? ?MONITOR:  ? ?Supplement acceptance, PO intake, Labs, Weight trends ? ?REASON FOR ASSESSMENT:  ? ?Consult ?Assessment of nutrition requirement/status ? ?ASSESSMENT:  ? ?81 y.o. male presented to the ED with headache, N/V, L side weakness, and AMS. PMH includes ESRD on HD (MWF), malnutrition, CHF, and Sjogren's disease. Pt admitted with hypertensive emergency and acute encephalopathy.   ? ?Pt was resting in bed, recently returned from dialysis. Pt complaining of a headache.  ? ?Pt reports that he was currently hungry due to missing breakfast. Pt reports that he was eating ok at home prior to admission. Pt was unable to provide dietary recall. Pt unable to answer if he drinks any supplement at home or his eating patterns related to dialysis days.  ?Per EMR, pt ate 20% of his breakfast on 4/9. ? ?When asked pt stated that he knew his EDW, but then was unable to provide it to RD. Pt did reports that he EDW is higher than it should be. Per EMR, pt has had a 6% weight loss within 7 months. Although, unable to determine fluid loss versus actual dry weight loss.  ? ?Lunch tray had arrived during RD visit. RD helped pt set up lunch tray.  ? ?Medications reviewed and include: Calcitriol, Cinacalcet, Protonix, Renvela, Lokelma, Vitamin B12 ?Labs reviewed: Sodium 132, Phosphorus 7.6 ? ?HD on 4/11 ?EDW: 73.2 kg ?Net UF: 1870 mL ?Pre-Dialysis Weight: 73.1  kg ?Post-Dialysis Weight: 71.3 kg ? ?NUTRITION - FOCUSED PHYSICAL EXAM: ? ?Deferred to follow-up.  ? ?Diet Order:   ?Diet Order   ? ?       ?  Diet renal with fluid restriction Fluid restriction: 1500 mL Fluid; Room service appropriate? Yes; Fluid consistency: Thin  Diet effective now       ?  ? ?  ?  ? ?  ? ? ?EDUCATION NEEDS:  ? ?Not appropriate for education at this time ? ?Skin:  Skin Assessment: Reviewed RN Assessment ? ?Last BM:  4/9 ? ?Height:  ? ?Ht Readings from Last 1 Encounters:  ?12/21/21 5\' 10"  (1.778 m)  ? ? ?Weight:  ? ?Wt Readings from Last 1 Encounters:  ?12/22/21 71.3 kg  ? ? ?Ideal Body Weight:  75.5 kg ? ?BMI:  Body mass index is 22.55 kg/m?. ? ?Estimated Nutritional Needs:  ? ?Kcal:  2000-2200 ? ?Protein:  100-115 grams ? ?Fluid:  UOP + 1L ? ? ? ?Hermina Barters RD, LDN ?Clinical Dietitian ?See AMiON for contact information.  ? ?

## 2021-12-22 NOTE — TOC Initial Note (Addendum)
Transition of Care (TOC) - Initial/Assessment Note  ? ? ?Patient Details  ?Name: Travis [name] ?MRN: [id number] ?Date of Birth: 11/16/1940 ? ?Transition of Care (TOC) CM/SW Contact:    ?Zenon Mayo, RN ?Phone Number: ?12/22/2021, 5:38 PM ? ?Clinical Narrative:                 ?From home with wife, he has an aide with [facility] on Tuesday's and Thursdays from 8 or 9:30 am til 3 or 4 pm.  He is a [place] patient as well.  NCM offered choice to wife for HHPT, HHOT, she was ok with [facility].  NCM made referral to [facility] with [facility] Awaiting to hear back if he can take referral. Tommi Rumps states he can take referral.  [place] notification number is [id number].  NCM called left [place] a vm to return call regarding HHPT, HHOT .  ? ?PCP is [place],  ?SW is [place] [phone] [place].   ? ?Expected Discharge Plan: [place] ?Barriers to Discharge: Continued Medical Work up ? ? ?Patient Goals and CMS Choice ?Patient states their goals for this hospitalization and ongoing recovery are:: return home ?CMS Medicare.gov Compare Post Acute Care list provided to:: Patient Represenative (must comment) ?Choice offered to / list presented to : Spouse ? ?Expected Discharge Plan and Services ?Expected Discharge Plan: [place] ?  ?Discharge Planning Services: CM Consult ?Post Acute Care Choice: NA ?Living arrangements for the past 2 months: [place] ?                ?  ?DME Agency: NA ?  ?  ?  ?HH Arranged: PT, OT ?[place] Agency: [place] ?Date HH Agency Contacted: 12/22/21 ?Time [place]: [id number] ?Representative spoke with at [place]: Tommi Rumps ? ?Prior Living Arrangements/Services ?Living arrangements for the past 2 months: [place] ?Lives with:: Spouse ?Patient language and need for interpreter reviewed:: Yes ?Do you feel safe going back to the place where you live?: Yes      ?Need for Family Participation in Patient Care: Yes  (Comment) ?Care giver support system in place?: Yes (comment) ?Current home services: Homehealth aide ([place]) ?Criminal Activity/Legal Involvement Pertinent to Current Situation/Hospitalization: No - Comment as needed ? ?Activities of Daily Living ?  ?ADL Screening (condition at time of admission) ?Is the patient deaf or have difficulty hearing?: No ?Does the patient have difficulty concentrating, remembering, or making decisions?: Yes ? ?Permission Sought/Granted ?  ?  ?   ?   ?   ?   ? ?Emotional Assessment ?Appearance:: Appears stated age ?Attitude/Demeanor/Rapport:  (appropriate) ?Affect (typically observed): Appropriate ?Orientation: : Fluctuating Orientation (Suspected and/or reported [place]) ?Alcohol / Substance Use: Not Applicable ?Psych Involvement: No (comment) ? ?Admission diagnosis:  End stage renal disease on dialysis ([place]) [N18.6, Z99.2] ?Hypertensive emergency [I16.1] ?Altered mental status, unspecified altered mental status type [R41.82] ?Nonintractable headache, unspecified chronicity pattern, unspecified headache type [R51.9] ?Patient Active Problem List  ? Diagnosis Date Noted  ? Delirium 12/22/2021  ? Hypertensive encephalopathy 12/20/2021  ? Hypertensive emergency 12/18/2021  ? 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 Chronic systolic CHF (congestive heart failure), NYHA class 2 ([place]) 11/05/2015  ? Thrombocytopenia ([place]) 08/11/2015  ? FTT (failure to thrive) in adult 08/11/2015  ? Acute systolic CHF (congestive heart failure) ([place]) 04/25/2015  ? Protein-calorie malnutrition, severe ([place]) 04/19/2015  ? 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  ESRD on dialysis [facility])   ? Palliative care encounter   ? Acute DVT of right tibial vein ([place]) 01/29/2015  ? Physical deconditioning 01/28/2015  ? Sjogren's disease ([place]) 01/28/2015  ? Normocytic anemia 12/17/2014  ? ?PCP:  [name], MD ?Pharmacy:   ?[id number] - , [place] - [address] AT [place] ?[place] ?[place] [address] ?Phone: [phone] Fax: [phone] ? ?[place], [place] [address] ?[id number] ?[place] [address] ?Phone: [phone] Fax: [phone] ? ?[facility] ?[address] ?[place] [address] ?Phone: [phone] Fax: [phone] ? ? ? ? ?Social Determinants of Health (SDOH) Interventions ?  ? ?Readmission Risk Interventions ? ?  12/22/2021  ?  5:33 PM 05/26/2021  ? 12:03 PM 05/26/2021  ? 12:02 PM  ?Readmission Risk Prevention Plan  ?Transportation Screening Complete Complete Complete  ?PCP or Specialist Appt within 3-5 Days Complete Complete   ?[place] or Home Care Consult Complete Complete   ?Social Work Consult for [place] Planning/Counseling Complete Complete   ?Palliative Care Screening Not Applicable Not Applicable   ?Medication Review Press photographer) Complete Complete   ? ? ? ?

## 2021-12-22 NOTE — Progress Notes (Signed)
Pt received  from HD. VSS. Call light in reach. Lunch tray ordered.  ? ?Clyde Canterbury, RN ? ?

## 2021-12-22 NOTE — Assessment & Plan Note (Signed)
Hypertensive emergency resolved; prescribed amlodipine at home, but does not appear that he was compliant. ?-Continue amlodipine & Coreg.  Would not titrate up beta-blocker further given bradycardia ?-Stable to transfer to med telemetry floor. ?-IV hydralazine, oral clonidine as needed for SBP sustained >160 ?-was initially seen by neurology upon admission and MRI requested-- will order and if abnormal most likely will require notification of neurology as not follow up done after initial consult ?

## 2021-12-22 NOTE — Hospital Course (Signed)
Travis [name] is an 81 y/o man admitted for hypertensive emergency with acute encephalopathy. He is now off cleviprex. He denies complaints. He reports he has been told he has high BP but was never prescribed medications for this.  Admitted to PCCM and transferred to [facility] on 4/11.  MRI pending from admission to r/o CVA.   ?

## 2021-12-22 NOTE — Progress Notes (Signed)
Mobility Specialist Progress Note: ? ? 12/22/21 1400  ?Mobility  ?Activity Dangled on edge of bed  ?Level of Assistance Modified independent, requires aide device or extra time  ?Assistive Device None  ?Activity Response Tolerated well  ?$Mobility charge 1 Mobility  ? ?Mobility session limited d/t decrease in cognition/headache. Pt wife states "he is very confused and will not listen to me." Pt agreed to sit EOB to eat lunch. No physical assistance required, only verbal cues for sequencing and increased time. Pt left sitting EOB with wife in room. ? ?Nelta Numbers ?Acute Rehab ?Phone: [phone] ?Office Phone: [phone] ? ?

## 2021-12-22 NOTE — Progress Notes (Signed)
Physical Therapy Treatment ?Patient Details ?Name: Travis [name] ?MRN: [id number] ?DOB: [birth year] ?Today's Date: 12/22/2021 ? ? ?History of Present Illness Patient is a 81 y/o male who presents on 12/18/21 with HA, AMS, vomiting and left sided weakness. Code stroke initiated. NIH:2. Head CT-negative. MRI-pending. Admitted with HTN crisis. 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 ? ?  ?PT Comments  ? ? Patient not progressing towards PT goals today. Reports a headache and seems slightly more confused today. Noted to have difficulty with initiation, sequencing and motor planning requiring step by step cues to perform tasks and increased time. Requires more assist to stand from EOB today and requires Min A for gait training. Per RN, MRI pending. Per wife, pt is confused and not at his cognitive baseline. If pt does not progress well, may need to consider post acute rehab. Will follow. ?   ?Recommendations for follow up therapy are one component of a multi-disciplinary discharge planning process, led by the attending physician.  Recommendations may be updated based on patient status, additional functional criteria and insurance authorization. ? ?Follow Up Recommendations ? Home health PT (vs SNF pending improvement) ?  ?  ?Assistance Recommended at Discharge Frequent or constant Supervision/Assistance  ?Patient can return home with the following A little help with walking and/or transfers;A little help with bathing/dressing/bathroom;Assistance with cooking/housework;Assist for transportation;Help with stairs or ramp for entrance;Direct supervision/assist for financial management;Direct supervision/assist for medications management ?  ?Equipment Recommendations ? None recommended by PT  ?  ?Recommendations for Other Services   ? ? ?  ?Precautions / Restrictions Precautions ?Precautions: Fall ?Precaution Comments: SBP  goal 140-160 ?Restrictions ?Weight Bearing Restrictions: No  ?  ? ?Mobility ? Bed Mobility ?Overal bed mobility: Needs Assistance ?Bed Mobility: Supine to Sit ?  ?  ?Supine to sit: Mod assist, HOB elevated ?Sit to supine: Supervision, HOB elevated ?  ?General bed mobility comments: Assist to bring LEs to EOB. cues for sequencing, step by step, poor initiation. ?  ? ?Transfers ?Overall transfer level: Needs assistance ?Equipment used: Rolling walker (2 wheels) ?Transfers: Sit to/from Stand ?Sit to Stand: Mod assist ?  ?  ?  ?  ?  ?General transfer comment: Mod A to power to standing with cues for hand placement. Mutliple attempts needed to rise but unable to perform independently despite attempts. ?  ? ?Ambulation/Gait ?Ambulation/Gait assistance: Min assist ?Gait Distance (Feet): 50 Feet ?Assistive device: Rolling walker (2 wheels) ?Gait Pattern/deviations: Step-through pattern, Decreased stride length, Trunk flexed ?Gait velocity: decreased ?  ?  ?General Gait Details: Slow, unsteady gait with max directional cues, difficulty following 1-2 step instructions for directions in hallway, Min A for balance. Cues for upright and RW proximity. ? ? ?Stairs ?  ?  ?  ?  ?  ? ? ?Wheelchair Mobility ?  ? ?Modified Rankin (Stroke Patients Only) ?  ? ? ?  ?Balance Overall balance assessment: Needs assistance ?Sitting-balance support: Feet supported, No upper extremity supported ?Sitting balance-Leahy Scale: Good ?  ?  ?Standing balance support: During functional activity ?Standing balance-Leahy Scale: Poor ?Standing balance comment: Reliant on RW ?  ?  ?  ?  ?  ?  ?  ?  ?  ?  ?  ?  ? ?  ?Cognition Arousal/Alertness: Awake/alert ?Behavior During Therapy: [facility] for tasks assessed/performed ?Overall Cognitive Status: No family/caregiver present to determine baseline cognitive functioning ?Area of Impairment: Memory, Following commands,  Problem solving, Attention ?  ?  ?  ?  ?  ?  ?  ?  ?Orientation Level: Disoriented to,  Situation ?Current Attention Level: Sustained ?Memory: Decreased short-term memory ?Following Commands: Follows one step commands with increased time ?  ?Awareness: Intellectual ?Problem Solving: Slow processing, Difficulty sequencing, Requires verbal cues, Requires tactile cues ?General Comments: Per wife, pt is confused today, "does this bed have a bathroom in it?" Needs step by step cues for sequencing, decreased initiation and difficulty motor planning. ?  ?  ? ?  ?Exercises   ? ?  ?General Comments General comments (skin integrity, edema, etc.): VSS on RA, Wife present during session. ?  ?  ? ?Pertinent Vitals/Pain Pain Assessment ?Pain Assessment: Faces ?Faces Pain Scale: Hurts little more ?Pain Location: headache ?Pain Descriptors / Indicators: Headache, Grimacing, Discomfort ?Pain Intervention(s): Monitored during session, Repositioned, Limited activity within patient's tolerance  ? ? ?Home Living   ?  ?  ?  ?  ?  ?  ?  ?  ?  ?   ?  ?Prior Function    ?  ?  ?   ? ?PT Goals (current goals can now be found in the care plan section) Progress towards PT goals: Not progressing toward goals - comment (secondary to cognition/headache?) ? ?  ?Frequency ? ? ? Min 3X/week ? ? ? ?  ?PT Plan Current plan remains appropriate  ? ? ?Co-evaluation   ?  ?  ?  ?  ? ?  ?AM-PAC PT "6 Clicks" Mobility   ?Outcome Measure ? Help needed turning from your back to your side while in a flat bed without using bedrails?: A Little ?Help needed moving from lying on your back to sitting on the side of a flat bed without using bedrails?: A Lot ?Help needed moving to and from a bed to a chair (including a wheelchair)?: A Lot ?Help needed standing up from a chair using your arms (e.g., wheelchair or bedside chair)?: A Lot ?Help needed to walk in hospital room?: A Little ?Help needed climbing 3-5 steps with a railing? : A Lot ?6 Click Score: 14 ? ?  ?End of Session Equipment Utilized During Treatment: Gait belt ?Activity Tolerance: Patient  limited by pain ?Patient left: in bed;with call bell/phone within reach;with bed alarm set;with family/visitor present ?Nurse Communication: Mobility status ?PT Visit Diagnosis: Muscle weakness (generalized) (M62.81);Difficulty in walking, not elsewhere classified (R26.2);Unsteadiness on feet (R26.81) ?  ? ? ?Time: 1446-1510 ?PT Time Calculation (min) (ACUTE ONLY): 24 min ? ?Charges:  $Gait Training: 8-22 mins ?$Therapeutic Activity: 8-22 mins          ?          ? ?Marisa Severin, PT, DPT ?Acute Rehabilitation Services ?Secure chat preferred ?Office [phone] ? ? ? ? ? ?[place] ?12/22/2021, 3:41 PM ? ?

## 2021-12-22 NOTE — Progress Notes (Signed)
?PROGRESS NOTE ? ? ? ?Travis [name]  ZOX:[id number] DOB: Aug 18, 1941 DOA: 12/18/2021 ?PCP: [name], MD  ? ? ?Brief Narrative:  ?Travis [name] is an 81 y/o man admitted for hypertensive emergency with acute encephalopathy. He is now off cleviprex. He denies complaints. He reports he has been told he has high BP but was never prescribed medications for this.  Admitted to PCCM and transferred to [facility] on 4/11.  MRI pending from admission to r/o CVA.    ? ? ?Assessment and Plan: ?* Hypertensive emergency ?Hypertensive emergency resolved; prescribed amlodipine at home, but does not appear that he was compliant. ?-Continue amlodipine & Coreg.  Would not titrate up beta-blocker further given bradycardia ?-Stable to transfer to med telemetry floor. ?-IV hydralazine, oral clonidine as needed for SBP sustained >160 ?-was initially seen by neurology upon admission and MRI requested-- will order and if abnormal most likely will require notification of neurology as not follow up done after initial consult ? ?Protein-calorie malnutrition, severe ([place]) ?Nutrition Status: ?Nutrition Problem: Increased nutrient needs ?Etiology: chronic illness (ESRD, CHF) ?Signs/Symptoms: estimated needs ?Interventions: MVI, Ensure Enlive (each supplement provides 350kcal and 20 grams of protein), Liberalize Diet ? ? ? ?ESRD on dialysis [facility]) ?-HD per nephrology ?-done 4/11 and plan again in AM ? ? ? ? ? ? ? ? ? ?DVT prophylaxis: heparin injection 5,000 Units Start: 12/18/21 2345 ? ?  Code Status: Full Code ?Family Communication:  ? ?Disposition Plan:  ?Level of care: Med-Surg ?Status is: Inpatient ?Remains inpatient appropriate because: needs MRI ?  ? ?Consultants:  ?Neurology ?PCCM ? ? ?Subjective: ?Wants to go home ? ?Objective: ?Vitals:  ? 12/22/21 1020 12/22/21 1030 12/22/21 1038 12/22/21 1121  ?BP: (!) 92/49 (!) 95/48 139/65 (!) 160/65  ?Pulse: (!) 59 (!) 57 63   ?Resp:   20 15  ?Temp:   97.8 ?F (36.6 ?C) 97.6 ?F (36.4 ?C)  ?TempSrc:   Oral  Oral  ?SpO2:   100% 98%  ?Weight:   71.3 kg   ?Height:      ? ? ?Intake/Output Summary (Last 24 hours) at 12/22/2021 1351 ?Last data filed at 12/22/2021 1038 ?Gross per 24 hour  ?Intake 180 ml  ?Output 1870 ml  ?Net -1690 ml  ? ?Filed Weights  ? 12/22/21 0400 12/22/21 0656 12/22/21 1038  ?Weight: 73.4 kg 73.1 kg 71.3 kg  ? ? ?Examination: ? ? ?General: Appearance:    elderly male in no acute distress  ?   ?Lungs:     respirations unlabored  ?Heart:    Normal heart rate. ?  ?MS:   All extremities are intact.  ?  ?Neurologic:   Awake, alert, told wife he was outside in the cold but knew he was at [facility]  ?  ? ? ? ?Data Reviewed: I have personally reviewed following labs and imaging studies ? ?CBC: ?Recent Labs  ?Lab 12/18/21 ?2011 12/18/21 ?2018 12/19/21 ?8119 12/19/21 ?1478 12/20/21 ?0615 12/21/21 ?2956 12/22/21 ?0724  ?WBC 7.5  --  10.0 10.2 7.9 7.9 11.9*  ?NEUTROABS 4.3  --   --   --  5.8  --   --   ?HGB 12.4*   < > 12.3* 12.8* 12.6* 11.5* 11.7*  ?HCT 39.9   < > 39.1 40.5 39.1 36.4* 35.4*  ?MCV 98.0  --  97.5 96.9 94.2 94.3 92.9  ?PLT 185  --  162 184 195 200 211  ? < > = values in this interval not displayed.  ? ?Basic Metabolic  Panel: ?Recent Labs  ?Lab 12/18/21 ?2011 12/18/21 ?2018 12/19/21 ?8295 12/19/21 ?6213 12/20/21 ?0615 12/21/21 ?0865 12/22/21 ?0725  ?NA 138 138  --  138 136 135 132*  ?K 4.3 4.3  --  5.5* 5.2* 5.7* 4.6  ?CL 92* 94*  --  93* 91* 89* 88*  ?CO2 34*  --   --  29 31 31 27   ?GLUCOSE 116* 114*  --  92 122* 123* 101*  ?BUN 15 17  --  21 30* 46* 68*  ?CREATININE 7.11* 7.10* 7.67* 8.41* 10.47* 12.75* 14.35*  ?CALCIUM 9.3  --   --  9.5 9.8 9.2 8.1*  ?MG  --   --  2.1  --  2.3  --   --   ?PHOS  --   --   --   --  7.0* 8.0* 7.6*  ? ?GFR: ?Estimated Creatinine Clearance: 4.1 mL/min (A) (by C-G formula based on SCr of 14.35 mg/dL (H)). ?Liver Function Tests: ?Recent Labs  ?Lab 12/18/21 ?2011 12/21/21 ?7846 12/22/21 ?0725  ?AST 15  --   --   ?ALT 9  --   --   ?ALKPHOS 103  --   --   ?BILITOT 0.4  --    --   ?PROT 8.5*  --   --   ?ALBUMIN 4.2 3.5 3.5  ? ?No results for input(s): LIPASE, AMYLASE in the last 168 hours. ?Recent Labs  ?Lab 12/19/21 ?9629  ?AMMONIA 14  ? ?Coagulation Profile: ?Recent Labs  ?Lab 12/18/21 ?2011  ?INR 1.0  ? ?Cardiac Enzymes: ?No results for input(s): CKTOTAL, CKMB, CKMBINDEX, TROPONINI in the last 168 hours. ?BNP (last 3 results) ?No results for input(s): PROBNP in the last 8760 hours. ?HbA1C: ?No results for input(s): HGBA1C in the last 72 hours. ?CBG: ?Recent Labs  ?Lab 12/18/21 ?2008  ?GLUCAP 70  ? ?Lipid Profile: ?Recent Labs  ?  12/20/21 ?0615  ?TRIG 96  ? ?Thyroid Function Tests: ?No results for input(s): TSH, T4TOTAL, FREET4, T3FREE, THYROIDAB in the last 72 hours. ?Anemia Panel: ?No results for input(s): VITAMINB12, FOLATE, FERRITIN, TIBC, IRON, RETICCTPCT in the last 72 hours. ?Sepsis Labs: ?No results for input(s): PROCALCITON, LATICACIDVEN in the last 168 hours. ? ?Recent Results (from the past 240 hour(s))  ?Resp Panel by RT-PCR (Flu A&B, Covid) Nasopharyngeal Swab     Status: None  ? Collection Time: 12/18/21  8:07 PM  ? Specimen: Nasopharyngeal Swab; Nasopharyngeal(NP) swabs in vial transport medium  ?Result Value Ref Range Status  ? SARS Coronavirus 2 by RT PCR NEGATIVE NEGATIVE Final  ?  Comment: (NOTE) ?SARS-CoV-2 target nucleic acids are NOT DETECTED. ? ?The SARS-CoV-2 RNA is generally detectable in upper respiratory ?specimens during the acute phase of infection. The lowest ?concentration of SARS-CoV-2 viral copies this assay can detect is ?138 copies/mL. A negative result does not preclude SARS-Cov-2 ?infection and should not be used as the sole basis for treatment or ?other patient management decisions. A negative result may occur with  ?improper specimen collection/handling, submission of specimen other ?than nasopharyngeal swab, presence of viral mutation(s) within the ?areas targeted by this assay, and inadequate number of viral ?copies(<138 copies/mL). A negative  result must be combined with ?clinical observations, patient history, and epidemiological ?information. The expected result is Negative. ? ?Fact Sheet for Patients:  ?EntrepreneurPulse.com.au ? ?Fact Sheet for Healthcare Providers:  ?IncredibleEmployment.be ? ?This test is no t yet approved or cleared by the [place] FDA and  ?has been authorized for detection and/or diagnosis of  SARS-CoV-2 by ?FDA under an Emergency Use Authorization (EUA). This EUA will remain  ?in effect (meaning this test can be used) for the duration of the ?COVID-19 declaration under Section 564(b)(1) of the Act, 21 ?U.S.C.section 360bbb-3(b)(1), unless the authorization is terminated  ?or revoked sooner.  ? ? ?  ? Influenza A by PCR NEGATIVE NEGATIVE Final  ? Influenza B by PCR NEGATIVE NEGATIVE Final  ?  Comment: (NOTE) ?The Xpert Xpress SARS-CoV-2/FLU/RSV plus assay is intended as an aid ?in the diagnosis of influenza from Nasopharyngeal swab specimens and ?should not be used as a sole basis for treatment. Nasal washings and ?aspirates are unacceptable for Xpert Xpress SARS-CoV-2/FLU/RSV ?testing. ? ?Fact Sheet for Patients: ?EntrepreneurPulse.com.au ? ?Fact Sheet for Healthcare Providers: ?IncredibleEmployment.be ? ?This test is not yet approved or cleared by the [place] FDA and ?has been authorized for detection and/or diagnosis of SARS-CoV-2 by ?FDA under an Emergency Use Authorization (EUA). This EUA will remain ?in effect (meaning this test can be used) for the duration of the ?COVID-19 declaration under Section 564(b)(1) of the Act, 21 U.S.C. ?section 360bbb-3(b)(1), unless the authorization is terminated or ?revoked. ? ?Performed at [facility], [place] [address] [place], [place] ?[address] ?  ?MRSA Next Gen by PCR, Nasal     Status: None  ? Collection Time: 12/19/21  8:29 AM  ? Specimen: Nasal Mucosa; Nasal Swab  ?Result Value Ref Range Status  ?  MRSA by PCR Next Gen NOT DETECTED NOT DETECTED Final  ?  Comment: (NOTE) ?The GeneXpert MRSA Assay (FDA approved for NASAL specimens only), ?is one component of a comprehensive MRSA colonization surveil

## 2021-12-22 NOTE — Progress Notes (Signed)
PT Cancellation Note ? ?Patient Details ?Name: Travis [name] ?MRN: [id number] ?DOB: 1941-04-29 ? ? ?Cancelled Treatment:    Reason Eval/Treat Not Completed: Patient at procedure or test/unavailable Pt off floor at HD. Will follow. ? ? ?[place] ?12/22/2021, 10:08 AM ?Marisa Severin, PT, DPT ?Acute Rehabilitation Services ?Secure chat preferred ?Office [phone] ? ? ? ? ? ?

## 2021-12-22 NOTE — Care Management Important Message (Signed)
Important Message ? ?Patient Details  ?Name: Travis [name] ?MRN: [id number] ?Date of Birth: November 10, 1940 ? ? ?Medicare Important Message Given:  Yes ? ? ? ? ?Shelda Altes ?12/22/2021, 8:15 AM ?

## 2021-12-22 NOTE — Assessment & Plan Note (Signed)
Nutrition Status: ?Nutrition Problem: Increased nutrient needs ?Etiology: chronic illness (ESRD, CHF) ?Signs/Symptoms: estimated needs ?Interventions: MVI, Ensure Enlive (each supplement provides 350kcal and 20 grams of protein), Liberalize Diet ? ? ?

## 2021-12-23 ENCOUNTER — Inpatient Hospital Stay (HOSPITAL_COMMUNITY): Payer: Medicare Other

## 2021-12-23 LAB — CBC
HCT: 36 % — ABNORMAL LOW (ref 39.0–52.0)
Hemoglobin: 11.6 g/dL — ABNORMAL LOW (ref 13.0–17.0)
MCH: 30.5 pg (ref 26.0–34.0)
MCHC: 32.2 g/dL (ref 30.0–36.0)
MCV: 94.7 fL (ref 80.0–100.0)
Platelets: 182 10*3/uL (ref 150–400)
RBC: 3.8 MIL/uL — ABNORMAL LOW (ref 4.22–5.81)
RDW: 18.1 % — ABNORMAL HIGH (ref 11.5–15.5)
WBC: 9.2 10*3/uL (ref 4.0–10.5)
nRBC: 0.3 % — ABNORMAL HIGH (ref 0.0–0.2)

## 2021-12-23 LAB — BASIC METABOLIC PANEL
Anion gap: 11 (ref 5–15)
BUN: 35 mg/dL — ABNORMAL HIGH (ref 8–23)
CO2: 28 mmol/L (ref 22–32)
Calcium: 8.4 mg/dL — ABNORMAL LOW (ref 8.9–10.3)
Chloride: 97 mmol/L — ABNORMAL LOW (ref 98–111)
Creatinine, Ser: 10.18 mg/dL — ABNORMAL HIGH (ref 0.61–1.24)
GFR, Estimated: 5 mL/min — ABNORMAL LOW (ref >60)
Glucose, Bld: 92 mg/dL (ref 70–99)
Potassium: 4.2 mmol/L (ref 3.5–5.1)
Sodium: 136 mmol/L (ref 135–145)

## 2021-12-23 LAB — TRIGLYCERIDES: Triglycerides: 67 mg/dL (ref <150)

## 2021-12-23 NOTE — Progress Notes (Signed)
PT Cancellation Note ? ?Patient Details ?Name: Travis [name] ?MRN: [id number] ?DOB: 01-Dec-1940 ? ? ?Cancelled Treatment:    Reason Eval/Treat Not Completed: Patient at procedure or test/unavailable Pt at HD. Will follow. ? ? ?[place] ?12/23/2021, 10:07 AM ?Marisa Severin, PT, DPT ?Acute Rehabilitation Services ?Secure chat preferred ?Office [phone] ? ? ? ? ? ?

## 2021-12-23 NOTE — Progress Notes (Signed)
OT Cancellation Note ? ?Patient Details ?Name: Travis [name] ?MRN: [id number] ?DOB: 1941-05-20 ? ? ?Cancelled Treatment:    Reason Eval/Treat Not Completed: Patient at procedure or test/ unavailable (Pt currently in HD. Will continue to follow.) ? ?[name] ?12/23/2021, 8:33 AM ?Nestor Lewandowsky, OTR/L ?Acute Rehabilitation Services ?Pager: [phone] ?Office: [phone]  ?

## 2021-12-23 NOTE — Procedures (Signed)
Patient was seen on dialysis and the procedure was supervised.  BFR 350  Via AVF BP is  124/54. ? ? Patient appears to be tolerating treatment well ? ?Travis [name] ?12/23/2021 ? ?

## 2021-12-23 NOTE — Progress Notes (Signed)
Pt receives out-pt HD at [facility] on MWF. Pt arrives at 7:00 for 7:20 chair time. Will assist as needed.  ? ?Melven Sartorius ?Renal Navigator ?[phone] ?

## 2021-12-23 NOTE — Plan of Care (Signed)
  Problem: Education: Goal: Knowledge of General Education information will improve Description: Including pain rating scale, medication(s)/side effects and non-pharmacologic comfort measures Outcome: Progressing   Problem: Clinical Measurements: Goal: Ability to maintain clinical measurements within normal limits will improve Outcome: Progressing Goal: Will remain free from infection Outcome: Progressing   

## 2021-12-23 NOTE — Progress Notes (Signed)
? Travis [name]  ERD:[id number] DOB: 01/05/1941 DOA: 12/18/2021 ?PCP: [name], MD   ? ?Brief Narrative:  ?81 year old with a history of ESRD on HD, pulmonary fibrosis, chronic combined systolic and diastolic CHF, anemia of chronic kidney disease, HTN, and Sjogren's disease who presented to the ED with severe headache nausea and vomiting.  He was found to have systolic blood pressures in the 230s at presentation.  CT head was negative for acute findings.  He was started on Cleviprex and admitted via PCCM.  With improvement in his condition he was able to be transferred to [facility] on 4/11. ? ?Consultants:  ?PCCM ?Neurology ? ?Code Status: FULL CODE ? ?DVT prophylaxis: ?Subcu heparin ? ?Interim Hx: ?Afebrile.  Vital signs stable.  Blood pressure well controlled.  Alert conversant and pleasant today, though mildly confused.  Denies any complaints.  Tells me he is quite anxious to go home. ? ?Assessment & Plan: ? ?Hypertensive emergency ?Presently resolved -was not taking amlodipine at home as prescribed -presently well controlled on amlodipine plus Coreg -Coreg dose being limited by bradycardia ? ?Hypertensive encephalopathy ?Due to above -was evaluated by neurology at time of presentation with CT head revealing no acute findings -MRI brain requested but still pending ? ?Severe protein calorie malnutrition ?Diet liberalized -nutrition following ? ?ESRD on HD ?Care as per nephrology ? ?CAD ?Continue aspirin, statin, beta-blocker ? ? ?Family Communication: No family present at time of exam ?Disposition: Home health PT recommended -hopeful for discharge home 4/13 if MRI completed ? ?Objective: ?Blood pressure 128/66, pulse 64, temperature 98.1 ?F (36.7 ?C), temperature source Temporal, resp. rate 18, height 5\' 10"  (1.778 m), weight 70.5 kg, SpO2 100 %. ? ?Intake/Output Summary (Last 24 hours) at 12/23/2021 0948 ?Last data filed at 12/23/2021 0700 ?Gross per 24 hour  ?Intake 300 ml  ?Output 1870 ml  ?Net -1570 ml  ? ?Filed  Weights  ? 12/22/21 1038 12/23/21 0351 12/23/21 0820  ?Weight: 71.3 kg 71.4 kg 70.5 kg  ? ? ?Examination: ?General: No acute respiratory distress ?Lungs: Clear to auscultation bilaterally without wheezes or crackles ?Cardiovascular: Regular rate and rhythm without murmur gallop or rub normal S1 and S2 ?Abdomen: Nontender, nondistended, soft, bowel sounds positive, no rebound, no ascites, no appreciable mass ?Extremities: No significant cyanosis, clubbing, or edema bilateral lower extremities ? ?CBC: ?Recent Labs  ?Lab 12/18/21 ?2011 12/18/21 ?2018 12/20/21 ?0615 12/21/21 ?5631 12/22/21 ?4970 12/23/21 ?0442  ?WBC 7.5   < > 7.9 7.9 11.9* 9.2  ?NEUTROABS 4.3  --  5.8  --   --   --   ?HGB 12.4*   < > 12.6* 11.5* 11.7* 11.6*  ?HCT 39.9   < > 39.1 36.4* 35.4* 36.0*  ?MCV 98.0   < > 94.2 94.3 92.9 94.7  ?PLT 185   < > 195 200 211 182  ? < > = values in this interval not displayed.  ? ?Basic Metabolic Panel: ?Recent Labs  ?Lab 12/19/21 ?2637 12/19/21 ?8588 12/20/21 ?0615 12/21/21 ?5027 12/22/21 ?0725 12/23/21 ?7412  ?NA  --    < > 136 135 132* 136  ?K  --    < > 5.2* 5.7* 4.6 4.2  ?CL  --    < > 91* 89* 88* 97*  ?CO2  --    < > 31 31 27 28   ?GLUCOSE  --    < > 122* 123* 101* 92  ?BUN  --    < > 30* 46* 68* 35*  ?CREATININE 7.67*   < >  10.47* 12.75* 14.35* 10.18*  ?CALCIUM  --    < > 9.8 9.2 8.1* 8.4*  ?MG 2.1  --  2.3  --   --   --   ?PHOS  --   --  7.0* 8.0* 7.6*  --   ? < > = values in this interval not displayed.  ? ?GFR: ?Estimated Creatinine Clearance: 5.8 mL/min (A) (by C-G formula based on SCr of 10.18 mg/dL (H)). ? ?Liver Function Tests: ?Recent Labs  ?Lab 12/18/21 ?2011 12/21/21 ?0102 12/22/21 ?0725  ?AST 15  --   --   ?ALT 9  --   --   ?ALKPHOS 103  --   --   ?BILITOT 0.4  --   --   ?PROT 8.5*  --   --   ?ALBUMIN 4.2 3.5 3.5  ? ? ?HbA1C: ?Hgb A1c MFr Bld  ?Date/Time Value Ref Range Status  ?05/21/2021 04:27 AM 4.7 (L) 4.8 - 5.6 % Final  ?  Comment:  ?  (NOTE) ?Pre diabetes:          5.7%-6.4% ? ?Diabetes:               >6.4% ? ?Glycemic control for   <7.0% ?adults with diabetes ?  ?11/06/2015 08:31 AM 4.9 4.8 - 5.6 % Final  ?  Comment:  ?  (NOTE) ?        Pre-diabetes: 5.7 - 6.4 ?        Diabetes: >6.4 ?        Glycemic control for adults with diabetes: <7.0 ?  ? ? ?CBG: ?Recent Labs  ?Lab 12/18/21 ?2008  ?GLUCAP 70  ? ? ?Scheduled Meds: ? amLODipine  10 mg Oral Daily  ? aspirin EC  81 mg Oral Daily  ? atorvastatin  40 mg Oral q1800  ? brinzolamide  1 drop Both Eyes TID  ? And  ? brimonidine  1 drop Both Eyes TID  ? calcitRIOL  3.25 mcg Oral Q M,W,F-HD  ? carvedilol  6.25 mg Oral BID WC  ? Chlorhexidine Gluconate Cloth  6 each Topical Daily  ? cinacalcet  90 mg Oral Q M,W,F-HD  ? feeding supplement  237 mL Oral BID BM  ? heparin  5,000 Units Subcutaneous Q8H  ? latanoprost  1 drop Both Eyes QHS  ? multivitamin  1 tablet Oral QHS  ? pantoprazole  40 mg Oral Daily  ? sevelamer carbonate  1,600 mg Oral TID WC  ? timolol  1 drop Both Eyes Daily  ? vitamin B-12  500 mcg Oral Daily  ? ?Continuous Infusions: ? [START ON 12/30/2021] ferric gluconate (FERRLECIT) IVPB    ? ? ? LOS: 5 days  ? ?Cherene Altes, MD ?[facility] ?Office  [phone] ?Pager - Text Page per Shea Evans ? ?If 7PM-7AM, please contact night-coverage per Amion ?12/23/2021, 9:48 AM ? ? ?

## 2021-12-23 NOTE — Progress Notes (Signed)
Physical Therapy Treatment ?Patient Details ?Name: Travis [name] ?MRN: [id number] ?DOB: March 23, 1941 ?Today's Date: 12/23/2021 ? ? ?History of Present Illness Patient is a 81 y/o male who presents on 12/18/21 with HA, AMS, vomiting and left sided weakness. Code stroke initiated. NIH:2. Head CT-negative. MRI-pending. Admitted with HTN crisis. 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 ? ?  ?PT Comments  ? ? Patient progressing slowly towards PT goals. Seems to be more confused this session after HD. Continues to have impaired initiation and requires max multimodal cues to perform functional tasks. Balance and gait appear better today with less fatigue however pt still needs cues for direction and safety to avoid running into things. Question some visual/perceptual deficits? Able to read calendar date on wall without difficulty. Oriented to self and month only this date. Will continue to follow and progress.  ?  ?Recommendations for follow up therapy are one component of a multi-disciplinary discharge planning process, led by the attending physician.  Recommendations may be updated based on patient status, additional functional criteria and insurance authorization. ? ?Follow Up Recommendations ? Home health PT (vs SNF pending improvement) ?  ?  ?Assistance Recommended at Discharge Frequent or constant Supervision/Assistance  ?Patient can return home with the following Assistance with cooking/housework;Assist for transportation;Help with stairs or ramp for entrance;Direct supervision/assist for financial management;Direct supervision/assist for medications management;A lot of help with walking and/or transfers;A lot of help with bathing/dressing/bathroom ?  ?Equipment Recommendations ? None recommended by PT  ?  ?Recommendations for Other Services   ? ? ?  ?Precautions / Restrictions Precautions ?Precautions: Fall;Other  (comment) ?Precaution Comments: SBP goal 140-160 ?Restrictions ?Weight Bearing Restrictions: No  ?  ? ?Mobility ? Bed Mobility ?Overal bed mobility: Needs Assistance ?Bed Mobility: Supine to Sit, Sit to Supine ?  ?  ?Supine to sit: HOB elevated, Min assist ?Sit to supine: Supervision, HOB elevated ?  ?General bed mobility comments: Better initiation and sequencing today, needed max cues to get movement started but then able to finish without assist. Pulling blankets back on top of him despite cues to get to EOB. ?  ? ?Transfers ?Overall transfer level: Needs assistance ?Equipment used: Rolling walker (2 wheels) ?Transfers: Sit to/from Stand ?Sit to Stand: Mod assist ?  ?  ?  ?  ?  ?General transfer comment: Heavy mod A to power to standing with manual cues to push off from bed (not able to verbally comprehend command and perform), slow to rise. ?  ? ?Ambulation/Gait ?Ambulation/Gait assistance: Min guard ?Gait Distance (Feet): 75 Feet ?Assistive device: Rolling walker (2 wheels) ?Gait Pattern/deviations: Step-through pattern, Decreased stride length, Trunk flexed ?Gait velocity: decreased ?  ?  ?General Gait Details: Slow, mildly unsteady gait, close Min guard, difficulty with turns. Cues for RW proximity and heavy WB through [place]. ? ? ?Stairs ?  ?  ?  ?  ?  ? ? ?Wheelchair Mobility ?  ? ?Modified Rankin (Stroke Patients Only) ?  ? ? ?  ?Balance Overall balance assessment: Needs assistance ?Sitting-balance support: Feet supported, No upper extremity supported ?Sitting balance-Leahy Scale: Good ?  ?  ?Standing balance support: During functional activity, Reliant on assistive device for balance ?Standing balance-Leahy Scale: Poor ?Standing balance comment: Reliant on RW ?  ?  ?  ?  ?  ?  ?  ?  ?  ?  ?  ?  ? ?  ?Cognition Arousal/Alertness: Awake/alert ?Behavior  During Therapy: [facility] for tasks assessed/performed ?Overall Cognitive Status: Impaired/Different from baseline ?Area of Impairment: Orientation, Attention,  Memory, Following commands, Awareness ?  ?  ?  ?  ?  ?  ?  ?  ?Orientation Level: Disoriented to, Place, Situation ?Current Attention Level: Sustained ?Memory: Decreased short-term memory ?  ?  ?  ?Problem Solving: Slow processing, Difficulty sequencing, Requires verbal cues, Requires tactile cues ?General Comments: Pt tangential and talking out of context to topic or questions asked. When asked to walk straight towards door, "I don't see a door." Proceeds to turn to walk into door. Oriented to self only. Irritated that he is not able to "go home real quick and then come back." Knows it is April but not where he is or why. Easily distracted. Improved motor planning today. ?  ?  ? ?  ?Exercises   ? ?  ?General Comments General comments (skin integrity, edema, etc.): VSS on RA. ?  ?  ? ?Pertinent Vitals/Pain Pain Assessment ?Pain Assessment: Faces ?Faces Pain Scale: Hurts a little bit ?Pain Location: BLEs-chronic ?Pain Descriptors / Indicators: Discomfort ?Pain Intervention(s): Monitored during session, Repositioned  ? ? ?Home Living   ?  ?  ?  ?  ?  ?  ?  ?  ?  ?   ?  ?Prior Function    ?  ?  ?   ? ?PT Goals (current goals can now be found in the care plan section) Progress towards PT goals: Progressing toward goals (slowly) ? ?  ?Frequency ? ? ? Min 3X/week ? ? ? ?  ?PT Plan Current plan remains appropriate  ? ? ?Co-evaluation   ?  ?  ?  ?  ? ?  ?AM-PAC PT "6 Clicks" Mobility   ?Outcome Measure ? Help needed turning from your back to your side while in a flat bed without using bedrails?: A Little ?Help needed moving from lying on your back to sitting on the side of a flat bed without using bedrails?: A Little ?Help needed moving to and from a bed to a chair (including a wheelchair)?: A Lot ?Help needed standing up from a chair using your arms (e.g., wheelchair or bedside chair)?: A Lot ?Help needed to walk in hospital room?: A Little ?Help needed climbing 3-5 steps with a railing? : A Lot ?6 Click Score: 15 ? ?   ?End of Session Equipment Utilized During Treatment: Gait belt ?Activity Tolerance: Patient tolerated treatment well ?Patient left: in bed;with call bell/phone within reach;with bed alarm set ?Nurse Communication: Mobility status ?PT Visit Diagnosis: Muscle weakness (generalized) (M62.81);Difficulty in walking, not elsewhere classified (R26.2);Unsteadiness on feet (R26.81) ?  ? ? ?Time: 1420-1440 ?PT Time Calculation (min) (ACUTE ONLY): 20 min ? ?Charges:  $Gait Training: 8-22 mins          ?          ? ?Marisa Severin, PT, DPT ?Acute Rehabilitation Services ?Secure chat preferred ?Office [phone] ? ? ? ? ? ?[place] ?12/23/2021, 3:24 PM ? ?

## 2021-12-23 NOTE — Progress Notes (Signed)
?[facility] ?Progress Note  ? ?Subjective:  Seen and examined on HD-  running 2 days in a row to get back on schedule.  BFR 350 mL/ min via AVF, UF goal 3L.  Feeling well and has no complaints. Does not know about going home ? ?Objective ?Vitals:  ? 12/23/21 0820 12/23/21 0829 12/23/21 0900 12/23/21 0930  ?BP: (!) 162/77 (!) 156/72 (!) 147/73 128/66  ?Pulse: 63 60 65 64  ?Resp: 18     ?Temp: 98.1 ?F (36.7 ?C)     ?TempSrc: Temporal     ?SpO2: 100%     ?Weight: 70.5 kg     ?Height:      ?  ? ? ?Additional Objective ?Labs: ?Basic Metabolic Panel: ?Recent Labs  ?Lab 12/20/21 ?0615 12/21/21 ?8938 12/22/21 ?0725 12/23/21 ?1017  ?NA 136 135 132* 136  ?K 5.2* 5.7* 4.6 4.2  ?CL 91* 89* 88* 97*  ?CO2 31 31 27 28   ?GLUCOSE 122* 123* 101* 92  ?BUN 30* 46* 68* 35*  ?CREATININE 10.47* 12.75* 14.35* 10.18*  ?CALCIUM 9.8 9.2 8.1* 8.4*  ?PHOS 7.0* 8.0* 7.6*  --   ? ?CBC: ?Recent Labs  ?Lab 12/18/21 ?2011 12/18/21 ?2018 12/19/21 ?5102 12/20/21 ?0615 12/21/21 ?5852 12/22/21 ?7782 12/23/21 ?0442  ?WBC 7.5   < > 10.2 7.9 7.9 11.9* 9.2  ?NEUTROABS 4.3  --   --  5.8  --   --   --   ?HGB 12.4*   < > 12.8* 12.6* 11.5* 11.7* 11.6*  ?HCT 39.9   < > 40.5 39.1 36.4* 35.4* 36.0*  ?MCV 98.0   < > 96.9 94.2 94.3 92.9 94.7  ?PLT 185   < > 184 195 200 211 182  ? < > = values in this interval not displayed.  ? ?Blood Culture ?   ?Component Value Date/Time  ? SDES BLOOD LEFT ARM 05/20/2021 1310  ? SPECREQUEST  05/20/2021 1310  ?  BOTTLES DRAWN AEROBIC AND ANAEROBIC Blood Culture results may not be optimal due to an inadequate volume of blood received in culture bottles  ? CULT  05/20/2021 1310  ?  NO GROWTH 5 DAYS ?Performed at [facility], [place] [address], [place], [place] [address] ?  ? REPTSTATUS 05/25/2021 FINAL 05/20/2021 1310  ? ? ? ?Physical Exam ?General: Elderly man, nad  ?Heart: RRR , II/IV systolic murmur ?Lungs: Clear bilaterally, on RA ?Abdomen: soft non-tender  ?Extremities: No LE edema ?Dialysis Access: RUE AVG  +bruit  ? ?Medications: ? [START ON 12/30/2021] ferric gluconate (FERRLECIT) IVPB    ? ? amLODipine  10 mg Oral Daily  ? aspirin EC  81 mg Oral Daily  ? atorvastatin  40 mg Oral q1800  ? brinzolamide  1 drop Both Eyes TID  ? And  ? brimonidine  1 drop Both Eyes TID  ? calcitRIOL  3.25 mcg Oral Q M,W,F-HD  ? carvedilol  6.25 mg Oral BID WC  ? Chlorhexidine Gluconate Cloth  6 each Topical Daily  ? cinacalcet  90 mg Oral Q M,W,F-HD  ? feeding supplement  237 mL Oral BID BM  ? heparin  5,000 Units Subcutaneous Q8H  ? latanoprost  1 drop Both Eyes QHS  ? multivitamin  1 tablet Oral QHS  ? pantoprazole  40 mg Oral Daily  ? sevelamer carbonate  1,600 mg Oral TID WC  ? timolol  1 drop Both Eyes Daily  ? vitamin B-12  500 mcg Oral Daily  ? ? ? OP  HD: MWF [facility] ? 3.5h  400/1.5   73.2kg  2/2 bath  RUA AVG ? - Hb 10.9 on 4/05 ? - mircera 50 q2, last 4/05, due 4/19 ? - venofer 50 weekly ? - sensipar 90 po tiw ? - rocaltrol 3.25 ug po tiw ?  ?  ?Assessment/ Plan: ?HTN'sive emergency - BP 230s on presentation, rx'd w/ IV Cleviprex and home po BP meds.  BP's better today 130/ 80 range, norvasc 10 and coreg 6.25 BID as well as prn clonidine off gtt. He says his wife recently took over the admin of his meds-  just need to be clear at discharge-  BP controlled with really not much meds  ?AMS - CT head neg, neuro following ?ESRD - on HD MWF. Bumped from HD scheduled 4/10, off schedule 4/11--> resume normal schedule today ?Hyperkalemia - improved - lokelma d/c'd-  claims that he has not missed any HD as OP  ?Volume - no vol excess on exam.  ?Chron resp failure/ hx IPF - 2L  at home ?Combined CHF - no specific Rx ?Anemia ckd - Hb >12. No esa needs  ?MBD ckd - CCa in range, add on phos. Cont vdra, sensipar and binders.  ?Dementia - at baseline. Agitation improved today  ?Dispo: Bps better, had HD- OK to d/c from renal perspective if no other issues ? ? ?Louis Meckel ? ? ?[facility] ?12/23/2021,9:50  AM ? ? ? ? ? ? ? ?

## 2021-12-23 NOTE — Progress Notes (Signed)
Occupational Therapy Treatment ?Patient Details ?Name: Travis [name] ?MRN: [id number] ?DOB: 10-01-1940 ?Today's Date: 12/23/2021 ? ? ?History of present illness Patient is a 81 y/o male who presents on 12/18/21 with HA, AMS, vomiting and left sided weakness. Code stroke initiated. NIH:2. Head CT-negative. MRI-pending. Admitted with HTN crisis. 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 ?  ?OT comments ? Pt feeling well following HD, talkative and tangential. Needing min assist for OOB with RW. Reports his male aide bathes him x 2/week and he and his wife manage otherwise. Encouraged pt to chair for meal, declined.   ? ?Recommendations for follow up therapy are one component of a multi-disciplinary discharge planning process, led by the attending physician.  Recommendations may be updated based on patient status, additional functional criteria and insurance authorization. ?   ?Follow Up Recommendations ? Home health OT  ?  ?Assistance Recommended at Discharge Frequent or constant Supervision/Assistance  ?Patient can return home with the following ? A little help with walking and/or transfers;A little help with bathing/dressing/bathroom;Assistance with cooking/housework;Direct supervision/assist for medications management;Direct supervision/assist for financial management;Assist for transportation;Help with stairs or ramp for entrance ?  ?Equipment Recommendations ? Tub/shower seat (pt will get through [place])  ?  ?Recommendations for Other Services   ? ?  ?Precautions / Restrictions Precautions ?Precautions: Fall;Other (comment) ?Precaution Comments: SBP goal 140-160 ?Restrictions ?Weight Bearing Restrictions: No  ? ? ?  ? ?Mobility Bed Mobility ?Overal bed mobility: Needs Assistance ?Bed Mobility: Supine to Sit, Sit to Supine ?  ?  ?Supine to sit: HOB elevated, Min assist ?Sit to supine: Supervision, HOB elevated ?   ?General bed mobility comments: repeatedly pulling blanket back up ?  ? ?Transfers ?Overall transfer level: Needs assistance ?Equipment used: Rolling walker (2 wheels) ?Transfers: Sit to/from Stand ?Sit to Stand: Min assist ?  ?  ?  ?  ?  ?General transfer comment: from elevated bed and BSC ?  ?  ?Balance Overall balance assessment: Needs assistance ?Sitting-balance support: Feet supported, No upper extremity supported ?Sitting balance-Leahy Scale: Good ?  ?  ?Standing balance support: During functional activity, Reliant on assistive device for balance ?Standing balance-Leahy Scale: Poor ?  ?  ?  ?  ?  ?  ?  ?  ?  ?  ?  ?  ?   ? ?ADL either performed or assessed with clinical judgement  ? ?ADL Overall ADL's : Needs assistance/impaired ?  ?  ?  ?Grooming Details (indicate cue type and reason): refused to brush teeth ?  ?  ?  ?  ?  ?  ?  ?  ?Toilet Transfer: Minimal assistance;Ambulation;Rolling walker (2 wheels) ?  ?Toileting- Clothing Manipulation and Hygiene: Minimal assistance;Sit to/from stand ?  ?  ?  ?Functional mobility during ADLs: Minimal assistance;Rolling walker (2 wheels) ?  ?  ? ?Extremity/Trunk Assessment   ?  ?  ?  ?  ?  ? ?Vision   ?  ?  ?Perception   ?  ?Praxis   ?  ? ?Cognition Arousal/Alertness: Awake/alert ?Behavior During Therapy: [facility] for tasks assessed/performed ?Overall Cognitive Status: Impaired/Different from baseline ?Area of Impairment: Orientation, Attention, Memory, Following commands, Awareness ?  ?  ?  ?  ?  ?  ?  ?  ?Orientation Level: Disoriented to, Place, Situation ?Current Attention Level: Sustained ?Memory: Decreased short-term memory ?Following Commands: Follows one step commands with increased time ?  ?Awareness:  Intellectual ?Problem Solving: Slow processing, Difficulty sequencing, Requires verbal cues, Requires tactile cues ?General Comments: tangential, difficulty redirecting ?  ?  ?   ?Exercises   ? ?  ?Shoulder Instructions   ? ? ?  ?General Comments VSS on RA.   ? ? ?Pertinent Vitals/ Pain       Pain Assessment ?Pain Assessment: No/denies pain ? ?Home Living   ?  ?  ?  ?  ?  ?  ?  ?  ?  ?  ?  ?  ?  ?  ?  ?  ?  ?  ? ?  ?Prior Functioning/Environment    ?  ?  ?  ?   ? ?Frequency ? Min 2X/week  ? ? ? ? ?  ?Progress Toward Goals ? ?OT Goals(current goals can now be found in the care plan section) ? Progress towards OT goals: Not progressing toward goals - comment ? ?Acute Rehab OT Goals ?OT Goal Formulation: With patient ?Time For Goal Achievement: 01/04/22 ?Potential to Achieve Goals: Good  ?Plan Discharge plan remains appropriate   ? ?Co-evaluation ? ? ?   ?  ?  ?  ?  ? ?  ?AM-PAC OT "6 Clicks" Daily Activity     ?Outcome Measure ? ? Help from another person eating meals?: A Little ?Help from another person taking care of personal grooming?: A Little ?Help from another person toileting, which includes using toliet, bedpan, or urinal?: A Little ?Help from another person bathing (including washing, rinsing, drying)?: A Lot ?Help from another person to put on and taking off regular upper body clothing?: A Little ?Help from another person to put on and taking off regular lower body clothing?: A Lot ?6 Click Score: 16 ? ?  ?End of Session Equipment Utilized During Treatment: Rolling walker (2 wheels);Gait belt ? ?OT Visit Diagnosis: Other abnormalities of gait and mobility (R26.89);Unsteadiness on feet (R26.81);Other symptoms and signs involving cognitive function ?  ?Activity Tolerance Patient tolerated treatment well ?  ?Patient Left in bed;with call bell/phone within reach;with bed alarm set;with family/visitor present ?  ?Nurse Communication   ?  ? ?   ? ?Time: 1530-1550 ?OT Time Calculation (min): 20 min ? ?Charges: OT General Charges ?$OT Visit: 1 Visit ?OT Treatments ?$Self Care/Home Management : 8-22 mins ? ?Nestor Lewandowsky, OTR/L ?Acute Rehabilitation Services ?Pager: [phone] ?Office: [phone]  ? ?[name] ?12/23/2021, 3:55 PM ?

## 2021-12-24 ENCOUNTER — Inpatient Hospital Stay (HOSPITAL_COMMUNITY): Payer: Medicare Other

## 2021-12-24 LAB — DRUG SCREEN 10 W/CONF, SERUM
Amphetamines, IA: NEGATIVE ng/mL
Barbiturates, IA: NEGATIVE ug/mL
Benzodiazepines, IA: NEGATIVE ng/mL
Cocaine & Metabolite, IA: NEGATIVE ng/mL
Methadone, IA: NEGATIVE ng/mL
Opiates, IA: NEGATIVE ng/mL
Oxycodones, IA: NEGATIVE ng/mL
Phencyclidine, IA: NEGATIVE ng/mL
Propoxyphene, IA: NEGATIVE ng/mL
THC(Marijuana) Metabolite, IA: NEGATIVE ng/mL

## 2021-12-24 MED ORDER — LOSARTAN POTASSIUM 25 MG PO TABS
25.0000 mg | ORAL_TABLET | Freq: Every day | ORAL | Status: DC
  Administered 2021-12-24: 25 mg via ORAL
  Filled 2021-12-24: qty 1

## 2021-12-24 MED ORDER — IOHEXOL 350 MG/ML SOLN
65.0000 mL | Freq: Once | INTRAVENOUS | Status: AC | PRN
  Administered 2021-12-24: 65 mL via INTRAVENOUS

## 2021-12-24 NOTE — Progress Notes (Signed)
? Travis [name]  OAC:[id number] DOB: 1941-09-10 DOA: 12/18/2021 ?PCP: [name], MD   ? ?Brief Narrative:  ?81 year old with a history of ESRD on HD, pulmonary fibrosis, chronic combined systolic and diastolic CHF, anemia of chronic kidney disease, HTN, and Sjogren's disease who presented to the ED with severe headache nausea and vomiting.  He was found to have systolic blood pressures in the 230s at presentation.  CT head was negative for acute findings.  He was started on Cleviprex and admitted via PCCM.  With improvement in his condition he was able to be transferred to [facility] on 4/11. ? ?Consultants:  ?PCCM ?Neurology ? ?Code Status: FULL CODE ? ?DVT prophylaxis: ?Subcu heparin ? ?Interim Hx: ?Afebrile.  Blood pressure elevated with systolics in the 010 range.  Saturations 96% room air.  MRI brain last night noted no acute intracranial findings but confirmed advanced chronic white matter microangiopathy and mild parenchymal volume loss, as well as a suspected aneurysm arising from the cavernous segment of the right ICA.  No new complaints.  Remains very anxious to go home.  Denies chest pain or shortness of breath. ? ?Assessment & Plan: ? ?Hypertensive emergency ?Presently resolved -was not taking amlodipine at home as prescribed -presently well controlled on amlodipine plus Coreg -Coreg dose being limited by bradycardia ? ?Hypertensive encephalopathy ?Due to above -was evaluated by neurology at time of presentation with CT head revealing no acute findings -MRI brain reveals no evidence of acute CVA  ? 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 ?Severe protein calorie malnutrition ?Diet liberalized -nutrition following ? ?ESRD on HD ?Care as per nephrology ? ?CAD ?Continue aspirin, statin, beta-blocker ? ? ?Family Communication: No family present at time of exam ?Disposition: Home health PT recommended -hopeful for  discharge home 4/14 if CTa completed ? ?Objective: ?Blood pressure (!) 169/76, pulse 65, temperature (!) 97.5 ?F (36.4 ?C), temperature source Oral, resp. rate (!) 185, height 5\' 10"  (1.778 m), weight 68.8 kg, SpO2 96 %. ? ?Intake/Output Summary (Last 24 hours) at 12/24/2021 0857 ?Last data filed at 12/24/2021 9323 ?Gross per 24 hour  ?Intake 240 ml  ?Output 2200 ml  ?Net -1960 ml  ? ? ?Filed Weights  ? 12/23/21 0820 12/23/21 1200 12/24/21 0519  ?Weight: 70.5 kg 68.5 kg 68.8 kg  ? ? ?Examination: ?General: No acute respiratory distress ?Lungs: Clear to auscultation bilaterally  ?Cardiovascular: Regular rate and rhythm without murmur  ?Abdomen: Nontender, nondistended, soft, bowel sounds positive ?Extremities: No significant edema bilateral lower extremities ? ?CBC: ?Recent Labs  ?Lab 12/18/21 ?2011 12/18/21 ?2018 12/20/21 ?0615 12/21/21 ?5573 12/22/21 ?2202 12/23/21 ?0442  ?WBC 7.5   < > 7.9 7.9 11.9* 9.2  ?NEUTROABS 4.3  --  5.8  --   --   --   ?HGB 12.4*   < > 12.6* 11.5* 11.7* 11.6*  ?HCT 39.9   < > 39.1 36.4* 35.4* 36.0*  ?MCV 98.0   < > 94.2 94.3 92.9 94.7  ?PLT 185   < > 195 200 211 182  ? < > = values in this interval not displayed.  ? ? ?Basic Metabolic Panel: ?Recent Labs  ?Lab 12/19/21 ?5427 12/19/21 ?0623 12/20/21 ?0615 12/21/21 ?7628 12/22/21 ?0725 12/23/21 ?3151  ?NA  --    < > 136 135 132* 136  ?K  --    < > 5.2* 5.7* 4.6 4.2  ?CL  --    < >  91* 89* 88* 97*  ?CO2  --    < > 31 31 27 28   ?GLUCOSE  --    < > 122* 123* 101* 92  ?BUN  --    < > 30* 46* 68* 35*  ?CREATININE 7.67*   < > 10.47* 12.75* 14.35* 10.18*  ?CALCIUM  --    < > 9.8 9.2 8.1* 8.4*  ?MG 2.1  --  2.3  --   --   --   ?PHOS  --   --  7.0* 8.0* 7.6*  --   ? < > = values in this interval not displayed.  ? ? ?GFR: ?Estimated Creatinine Clearance: 5.6 mL/min (A) (by C-G formula based on SCr of 10.18 mg/dL (H)). ? ?Liver Function Tests: ?Recent Labs  ?Lab 12/18/21 ?2011 12/21/21 ?4401 12/22/21 ?0725  ?AST 15  --   --   ?ALT 9  --   --   ?ALKPHOS  103  --   --   ?BILITOT 0.4  --   --   ?PROT 8.5*  --   --   ?ALBUMIN 4.2 3.5 3.5  ? ? ? ?HbA1C: ?Hgb A1c MFr Bld  ?Date/Time Value Ref Range Status  ?05/21/2021 04:27 AM 4.7 (L) 4.8 - 5.6 % Final  ?  Comment:  ?  (NOTE) ?Pre diabetes:          5.7%-6.4% ? ?Diabetes:              >6.4% ? ?Glycemic control for   <7.0% ?adults with diabetes ?  ?11/06/2015 08:31 AM 4.9 4.8 - 5.6 % Final  ?  Comment:  ?  (NOTE) ?        Pre-diabetes: 5.7 - 6.4 ?        Diabetes: >6.4 ?        Glycemic control for adults with diabetes: <7.0 ?  ? ? ?CBG: ?Recent Labs  ?Lab 12/18/21 ?2008  ?GLUCAP 70  ? ? ? ?Scheduled Meds: ? amLODipine  10 mg Oral Daily  ? aspirin EC  81 mg Oral Daily  ? atorvastatin  40 mg Oral q1800  ? brinzolamide  1 drop Both Eyes TID  ? And  ? brimonidine  1 drop Both Eyes TID  ? calcitRIOL  3.25 mcg Oral Q M,W,F-HD  ? carvedilol  6.25 mg Oral BID WC  ? Chlorhexidine Gluconate Cloth  6 each Topical Daily  ? cinacalcet  90 mg Oral Q M,W,F-HD  ? feeding supplement  237 mL Oral BID BM  ? heparin  5,000 Units Subcutaneous Q8H  ? latanoprost  1 drop Both Eyes QHS  ? multivitamin  1 tablet Oral QHS  ? pantoprazole  40 mg Oral Daily  ? sevelamer carbonate  1,600 mg Oral TID WC  ? timolol  1 drop Both Eyes Daily  ? vitamin B-12  500 mcg Oral Daily  ? ?Continuous Infusions: ? [START ON 12/30/2021] ferric gluconate (FERRLECIT) IVPB    ? ? ? LOS: 6 days  ? ?Cherene Altes, MD ?[facility] ?Office  [phone] ?Pager - Text Page per Shea Evans ? ?If 7PM-7AM, please contact night-coverage per Amion ?12/24/2021, 8:57 AM ? ? ?

## 2021-12-24 NOTE — TOC Progression Note (Signed)
Transition of Care (TOC) - Progression Note  ? ? ?Patient Details  ?Name: Travis [name] ?MRN: [id number] ?Date of Birth: Apr 14, 1941 ? ?Transition of Care (TOC) CM/SW Contact  ?Zenon Mayo, RN ?Phone Number: ?12/24/2021, 3:32 PM ? ?Clinical Narrative:    ?NCM contacted the pharmacy with the [place]  to ask about the blister packs for patient medications. The rep states that the doctor will have to request this on the scripts that they send over to the [place] to be filled.  NCM informed MD of this information.  ? ? ?Expected Discharge Plan: [place] ?Barriers to Discharge: Continued Medical Work up ? ?Expected Discharge Plan and Services ?Expected Discharge Plan: [place] ?  ?Discharge Planning Services: CM Consult ?Post Acute Care Choice: NA ?Living arrangements for the past 2 months: [place] ?                ?  ?DME Agency: NA ?  ?  ?  ?HH Arranged: PT, OT ?[place] Agency: [place] ?Date HH Agency Contacted: 12/22/21 ?Time [place]: 0315 ?Representative spoke with at [place]: Tommi Rumps ? ? ?Social Determinants of Health (SDOH) Interventions ?  ? ?Readmission Risk Interventions ? ?  12/22/2021  ?  5:33 PM 05/26/2021  ? 12:03 PM 05/26/2021  ? 12:02 PM  ?Readmission Risk Prevention Plan  ?Transportation Screening Complete Complete Complete  ?PCP or Specialist Appt within 3-5 Days Complete Complete   ?[place] or Home Care Consult Complete Complete   ?Social Work Consult for [place] Planning/Counseling Complete Complete   ?Palliative Care Screening Not Applicable Not Applicable   ?Medication Review Press photographer) Complete Complete   ? ? ?

## 2021-12-24 NOTE — TOC Progression Note (Signed)
Transition of Care (TOC) - Progression Note  ? ? ?Patient Details  ?Name: Travis [name] ?MRN: [id number] ?Date of Birth: 22-Dec-1940 ? ?Transition of Care (TOC) CM/SW Contact  ?Zenon Mayo, RN ?Phone Number: ?12/24/2021, 2:52 PM ? ?Clinical Narrative:    ?Per MD he would like to do a cc angio , questioning a possible aneurysm . Will wait for those results, so maybe here another day or so.  TOC will continue to follow for dc needs.  ? ? ?Expected Discharge Plan: [place] ?Barriers to Discharge: Continued Medical Work up ? ?Expected Discharge Plan and Services ?Expected Discharge Plan: [place] ?  ?Discharge Planning Services: CM Consult ?Post Acute Care Choice: NA ?Living arrangements for the past 2 months: [place] ?                ?  ?DME Agency: NA ?  ?  ?  ?HH Arranged: PT, OT ?[place] Agency: [place] ?Date HH Agency Contacted: 12/22/21 ?Time [place]: 7741 ?Representative spoke with at [place]: Tommi Rumps ? ? ?Social Determinants of Health (SDOH) Interventions ?  ? ?Readmission Risk Interventions ? ?  12/22/2021  ?  5:33 PM 05/26/2021  ? 12:03 PM 05/26/2021  ? 12:02 PM  ?Readmission Risk Prevention Plan  ?Transportation Screening Complete Complete Complete  ?PCP or Specialist Appt within 3-5 Days Complete Complete   ?[place] or Home Care Consult Complete Complete   ?Social Work Consult for [place] Planning/Counseling Complete Complete   ?Palliative Care Screening Not Applicable Not Applicable   ?Medication Review Press photographer) Complete Complete   ? ? ?

## 2021-12-24 NOTE — Progress Notes (Signed)
?[facility] ?Progress Note  ? ?Subjective:  HD yest-  removed 2200-  BP down by end SBP 108 but then back up after-  he feels good-  wife is here-  looking forward to hopefully going home   ? ?Objective ?Vitals:  ? 12/23/21 1545 12/23/21 1929 12/24/21 0519 12/24/21 0806  ?BP: (!) 175/70 (!) 163/60 (!) 160/69 (!) 169/76  ?Pulse: 63 66 (!) 59 65  ?Resp: 18  18 (!) 185  ?Temp: 98.6 ?F (37 ?C) 98.2 ?F (36.8 ?C) 98 ?F (36.7 ?C) (!) 97.5 ?F (36.4 ?C)  ?TempSrc: Oral Oral  Oral  ?SpO2: 100% 100% 96% 96%  ?Weight:   68.8 kg   ?Height:      ?  ? ? ?Additional Objective ?Labs: ?Basic Metabolic Panel: ?Recent Labs  ?Lab 12/20/21 ?0615 12/21/21 ?2952 12/22/21 ?0725 12/23/21 ?8413  ?NA 136 135 132* 136  ?K 5.2* 5.7* 4.6 4.2  ?CL 91* 89* 88* 97*  ?CO2 31 31 27 28   ?GLUCOSE 122* 123* 101* 92  ?BUN 30* 46* 68* 35*  ?CREATININE 10.47* 12.75* 14.35* 10.18*  ?CALCIUM 9.8 9.2 8.1* 8.4*  ?PHOS 7.0* 8.0* 7.6*  --   ? ?CBC: ?Recent Labs  ?Lab 12/18/21 ?2011 12/18/21 ?2018 12/19/21 ?2440 12/20/21 ?0615 12/21/21 ?1027 12/22/21 ?2536 12/23/21 ?0442  ?WBC 7.5   < > 10.2 7.9 7.9 11.9* 9.2  ?NEUTROABS 4.3  --   --  5.8  --   --   --   ?HGB 12.4*   < > 12.8* 12.6* 11.5* 11.7* 11.6*  ?HCT 39.9   < > 40.5 39.1 36.4* 35.4* 36.0*  ?MCV 98.0   < > 96.9 94.2 94.3 92.9 94.7  ?PLT 185   < > 184 195 200 211 182  ? < > = values in this interval not displayed.  ? ?Blood Culture ?   ?Component Value Date/Time  ? SDES BLOOD LEFT ARM 05/20/2021 1310  ? SPECREQUEST  05/20/2021 1310  ?  BOTTLES DRAWN AEROBIC AND ANAEROBIC Blood Culture results may not be optimal due to an inadequate volume of blood received in culture bottles  ? CULT  05/20/2021 1310  ?  NO GROWTH 5 DAYS ?Performed at [facility], [place] [address] [place], [place] [address] ?  ? REPTSTATUS 05/25/2021 FINAL 05/20/2021 1310  ? ? ? ?Physical Exam ?General: Elderly man, nad  ?Heart: RRR , II/IV systolic murmur ?Lungs: Clear bilaterally, on RA ?Abdomen: soft non-tender   ?Extremities: No LE edema ?Dialysis Access: RUE AVG +bruit  ? ?Medications: ? [START ON 12/30/2021] ferric gluconate (FERRLECIT) IVPB    ? ? amLODipine  10 mg Oral Daily  ? aspirin EC  81 mg Oral Daily  ? atorvastatin  40 mg Oral q1800  ? brinzolamide  1 drop Both Eyes TID  ? And  ? brimonidine  1 drop Both Eyes TID  ? calcitRIOL  3.25 mcg Oral Q M,W,F-HD  ? carvedilol  6.25 mg Oral BID WC  ? Chlorhexidine Gluconate Cloth  6 each Topical Daily  ? cinacalcet  90 mg Oral Q M,W,F-HD  ? feeding supplement  237 mL Oral BID BM  ? heparin  5,000 Units Subcutaneous Q8H  ? latanoprost  1 drop Both Eyes QHS  ? multivitamin  1 tablet Oral QHS  ? pantoprazole  40 mg Oral Daily  ? sevelamer carbonate  1,600 mg Oral TID WC  ? timolol  1 drop Both Eyes Daily  ? vitamin B-12  500 mcg  Oral Daily  ? ? ? OP HD: MWF [facility] ? 3.5h  400/1.5   73.2kg  2/2 bath  RUA AVG ? - Hb 10.9 on 4/05 ? - mircera 50 q2, last 4/05, due 4/19 ? - venofer 50 weekly ? - sensipar 90 po tiw ? - rocaltrol 3.25 ug po tiw ?  ?  ?Assessment/ Plan: ?HTN'sive emergency - BP 230s on presentation, rx'd w/ IV Cleviprex and home po BP meds.  BP's then better 130/ 80 range, norvasc 10 and coreg 6.25 BID as well as prn clonidine off gtt. He says his wife recently took over the admin of his meds-  just need to be clear at discharge-  BP was controlled with really not much meds.  I will add losartan 25 at night really dont want him on clonidine if we can help it and hesitant to increase coreg with hr in the low 60's -  we can continue to fine tune as OP  ?AMS - CT head neg, neuro following ?ESRD - on HD MWF. Bumped from HD schedule 4/10, off schedule 4/11--> had HD yest to get back on schedule-  will be due tomorrow either here or at home unit ?Hyperkalemia - improved - lokelma d/c'd-  claims that he has not missed any HD as OP  ?Volume - no vol excess on exam. got to 68.5 yesterday and BP was good-  looks like needs EDW lower as OP and that will help BP as well  ?Chron resp  failure/ hx IPF - 2L  at home ?Combined CHF - no specific Rx ?Anemia ckd - Hb >12. No esa needs  ?MBD ckd - CCa in range, phos a little high. Cont vdra, sensipar and binders.  ?Dementia - at baseline. ?Dispo: Bps reasonable-  on HD schedule - OK to d/c from renal perspective if no other issues ? ? ?Louis Meckel ? ? ?[facility] ?12/24/2021,8:53 AM ? ? ? ? ? ? ? ?

## 2021-12-25 LAB — CBC
HCT: 38.8 % — ABNORMAL LOW (ref 39.0–52.0)
Hemoglobin: 12.3 g/dL — ABNORMAL LOW (ref 13.0–17.0)
MCH: 30.3 pg (ref 26.0–34.0)
MCHC: 31.7 g/dL (ref 30.0–36.0)
MCV: 95.6 fL (ref 80.0–100.0)
Platelets: 176 10*3/uL (ref 150–400)
RBC: 4.06 MIL/uL — ABNORMAL LOW (ref 4.22–5.81)
RDW: 18.5 % — ABNORMAL HIGH (ref 11.5–15.5)
WBC: 9 10*3/uL (ref 4.0–10.5)
nRBC: 0 % (ref 0.0–0.2)

## 2021-12-25 LAB — BASIC METABOLIC PANEL
Anion gap: 13 (ref 5–15)
BUN: 35 mg/dL — ABNORMAL HIGH (ref 8–23)
CO2: 26 mmol/L (ref 22–32)
Calcium: 9.1 mg/dL (ref 8.9–10.3)
Chloride: 94 mmol/L — ABNORMAL LOW (ref 98–111)
Creatinine, Ser: 10.39 mg/dL — ABNORMAL HIGH (ref 0.61–1.24)
GFR, Estimated: 5 mL/min — ABNORMAL LOW (ref >60)
Glucose, Bld: 83 mg/dL (ref 70–99)
Potassium: 4.5 mmol/L (ref 3.5–5.1)
Sodium: 133 mmol/L — ABNORMAL LOW (ref 135–145)

## 2021-12-25 MED ORDER — CINACALCET HCL 30 MG PO TABS: 90.0000 mg | ORAL_TABLET | ORAL | Status: DC

## 2021-12-25 MED ORDER — CARVEDILOL 6.25 MG PO TABS: 6.2500 mg | ORAL_TABLET | Freq: Two times a day (BID) | ORAL | 1 refills | Status: DC

## 2021-12-25 MED ORDER — SODIUM CHLORIDE 0.9 % IV SOLN: 62.5000 mg | INTRAVENOUS | Status: DC

## 2021-12-25 MED ORDER — CALCITRIOL 0.25 MCG PO CAPS: 3.2500 ug | ORAL_CAPSULE | ORAL | Status: DC

## 2021-12-25 MED ORDER — LOSARTAN POTASSIUM 25 MG PO TABS: 25.0000 mg | ORAL_TABLET | Freq: Every day | ORAL | 1 refills | Status: DC

## 2021-12-25 NOTE — Progress Notes (Signed)
Subjective: Seen in room with wife present has no complaints.  Plans for HD today and noted then can be discharged ? ?Objective ?Vital signs in last 24 hours: ?Vitals:  ? 12/24/21 0807 12/24/21 2010 12/25/21 0530 12/25/21 0850  ?BP:  (!) 159/62 (!) 169/61 (!) 145/58  ?Pulse:   60 66  ?Resp: 18     ?Temp:  98.5 ?F (36.9 ?C) 98.5 ?F (36.9 ?C) 98.2 ?F (36.8 ?C)  ?TempSrc:  Oral Oral Oral  ?SpO2:  100% 100% 100%  ?Weight:      ?Height:      ? ?Weight change:  ? ?Physical Exam: ?General: Thin elderly male NAD ?Heart: RRR 2/6 SEM no rub or gallop ?Lungs: CTA bilaterally on room air nonlabored breathing ?Abdomen: NABS, S, NT ND ?Extremities: No LE edema  ?dialysis Access: RUA AVG positive bruit, mild aneurysmal area  ? ? OP HD: MWF [facility] ? 3.5h  400/1.5   73.2kg  2/2 bath  RUA AVG ? - Hb 10.9 on 4/05 ? - mircera 50 q2, last 4/05, due 4/19 ? - venofer 50 weekly ? - sensipar 90 po tiw ? - rocaltrol 3.25 ug po tiw ?  ?  ?Problem/Plan: ?HTN'sive emergency - BP 230s on presentation, rx'd w/ IV Cleviprex and home po BP meds.  Improved now 131/55 . norvasc 10 and coreg 6.25 BID as well as prn clonidine off gtt. He gave history of =his wife recently took over the admin of his meds-  just need to be clear at discharge-  BP was controlled with really not much meds.  Yesterday Dr. Moshe Cipro added losartan 25 hs. .if possible  dont want him on clonidine if  can help it and hesitant to increase coreg with hr in the low 60's -  we can continue to fine tune as OP  ?AMS -this a.m. appears normal MS to me ,CT head neg, and MRI neuro work-up, thought to be due to hypertensive encephalopathy ?ESRD - on HD MWF. Bumped from HD schedule 4/10, off schedule 4/11--> had HD 4/12 to get back on schedule-  will be due today in hospital, unfortunately no room at outpatient center for HD later today  ?Hyperkalemia - improved - lokelma d/c'd-  claims that he has not missed any HD as OP  ?Volume - no vol excess on exam. got to 68.5/with last HD 4/12 and  BP was good-  looks like needs EDW lower as OP and that will help BP as well follow-up post weight today after HD ?Chron resp failure/ hx IPF - 2L [address] at home ?Combined CHF - no specific Rx ?Anemia ckd - Hb >12. No esa needs  ?MBD ckd - CCa in range, phos a little high. Cont vdra, sensipar and binders.  ?Dementia - at baseline. ?Dispo: Bps reasonable-  on HD schedule - OK to d/c from renal perspective if no other issues after dialysis today ? ? ?Ernest Haber, PA-C ?[facility] ?Beeper [phone] ?12/25/2021,11:40 AM ? LOS: 7 days  ? ?Labs: ?Basic Metabolic Panel: ?Recent Labs  ?Lab 12/20/21 ?0615 12/21/21 ?2263 12/22/21 ?0725 12/23/21 ?3354 12/25/21 ?5625  ?NA 136 135 132* 136 133*  ?K 5.2* 5.7* 4.6 4.2 4.5  ?CL 91* 89* 88* 97* 94*  ?CO2 31 31 27 28 26   ?GLUCOSE 122* 123* 101* 92 83  ?BUN 30* 46* 68* 35* 35*  ?CREATININE 10.47* 12.75* 14.35* 10.18* 10.39*  ?CALCIUM 9.8 9.2 8.1* 8.4* 9.1  ?PHOS 7.0* 8.0* 7.6*  --   --   ? ?  Liver Function Tests: ?Recent Labs  ?Lab 12/18/21 ?2011 12/21/21 ?1275 12/22/21 ?0725  ?AST 15  --   --   ?ALT 9  --   --   ?ALKPHOS 103  --   --   ?BILITOT 0.4  --   --   ?PROT 8.5*  --   --   ?ALBUMIN 4.2 3.5 3.5  ? ?No results for input(s): LIPASE, AMYLASE in the last 168 hours. ?Recent Labs  ?Lab 12/19/21 ?1700  ?AMMONIA 14  ? ?CBC: ?Recent Labs  ?Lab 12/18/21 ?2011 12/18/21 ?2018 12/20/21 ?0615 12/21/21 ?1749 12/22/21 ?4496 12/23/21 ?7591 12/25/21 ?6384  ?WBC 7.5   < > 7.9 7.9 11.9* 9.2 9.0  ?NEUTROABS 4.3  --  5.8  --   --   --   --   ?HGB 12.4*   < > 12.6* 11.5* 11.7* 11.6* 12.3*  ?HCT 39.9   < > 39.1 36.4* 35.4* 36.0* 38.8*  ?MCV 98.0   < > 94.2 94.3 92.9 94.7 95.6  ?PLT 185   < > 195 200 211 182 176  ? < > = values in this interval not displayed.  ? ?Cardiac Enzymes: ?No results for input(s): CKTOTAL, CKMB, CKMBINDEX, TROPONINI in the last 168 hours. ?CBG: ?Recent Labs  ?Lab 12/18/21 ?2008  ?GLUCAP 70  ? ?Medications: ? [START ON 12/30/2021] ferric gluconate (FERRLECIT) IVPB     ? ? amLODipine  10 mg Oral Daily  ? aspirin EC  81 mg Oral Daily  ? atorvastatin  40 mg Oral q1800  ? brinzolamide  1 drop Both Eyes TID  ? And  ? brimonidine  1 drop Both Eyes TID  ? calcitRIOL  3.25 mcg Oral Q M,W,F-HD  ? carvedilol  6.25 mg Oral BID WC  ? Chlorhexidine Gluconate Cloth  6 each Topical Daily  ? cinacalcet  90 mg Oral Q M,W,F-HD  ? feeding supplement  237 mL Oral BID BM  ? heparin  5,000 Units Subcutaneous Q8H  ? latanoprost  1 drop Both Eyes QHS  ? losartan  25 mg Oral QHS  ? multivitamin  1 tablet Oral QHS  ? pantoprazole  40 mg Oral Daily  ? sevelamer carbonate  1,600 mg Oral TID WC  ? timolol  1 drop Both Eyes Daily  ? vitamin B-12  500 mcg Oral Daily  ? ? ? ? ?

## 2021-12-25 NOTE — Progress Notes (Signed)
Patient and wife at bedside requesting to speak with MD. Per wife, " I only want his dialysis to be 3 1/2 hours and after dialysis I want to take him home".  ?

## 2021-12-25 NOTE — Progress Notes (Signed)
Physical Therapy Treatment ?Patient Details ?Name: Travis [name] ?MRN: [id number] ?DOB: [birth year] ?Today's Date: 12/25/2021 ? ? ?History of Present Illness Patient is a 81 y/o male who presents on 12/18/21 with HA, AMS, vomiting and left sided weakness. Code stroke initiated. NIH:2. Head CT-negative. MRI-pending. Admitted with HTN crisis. 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 ? ?  ?PT Comments  ? ? The pt was eager to mobilize, making good progress with hallway ambulation endurance but continues to need minG, BUE support, and max cues for posture and positioning in RW. The pt was further challenged by repeated sit-stand from EOB due to deficits in LE strength, power, and stability. He was reliant on at least some UE support and minA to steady in standing. The pt and his wife were educated on progressive walking program and sit-stand exercises the pt can safely continue at home. Both expressed understanding. The pt does continue to present with cognitive impairments as described below, most evident in difficulties wayfinding in the hallway and was unable to return to his room without max cues.  ? ?Gait Speed: 0.47m/s using RW. (Gait speed <0.73m/s indicates increased risk of falls and dependence in ADLs) ?   ?Recommendations for follow up therapy are one component of a multi-disciplinary discharge planning process, led by the attending physician.  Recommendations may be updated based on patient status, additional functional criteria and insurance authorization. ? ?Follow Up Recommendations ? Home health PT ?  ?  ?Assistance Recommended at Discharge Frequent or constant Supervision/Assistance  ?Patient can return home with the following Assistance with cooking/housework;Assist for transportation;Help with stairs or ramp for entrance;Direct supervision/assist for financial management;Direct supervision/assist for  medications management;A lot of help with bathing/dressing/bathroom;A little help with walking and/or transfers ?  ?Equipment Recommendations ? None recommended by PT  ?  ?Recommendations for Other Services   ? ? ?  ?Precautions / Restrictions Precautions ?Precautions: Fall;Other (comment) ?Precaution Comments: SBP goal 140-160 ?Restrictions ?Weight Bearing Restrictions: No  ?  ? ?Mobility ? Bed Mobility ?Overal bed mobility: Needs Assistance ?Bed Mobility: Supine to Sit, Sit to Supine ?  ?  ?Supine to sit: Min guard ?Sit to supine: Supervision ?  ?General bed mobility comments: minG with use of bed rail, supervision to return to bed with cues for scooting up and repositioning in bed ?  ? ?Transfers ?Overall transfer level: Needs assistance ?Equipment used: Rolling walker (2 wheels), None ?Transfers: Sit to/from Stand ?Sit to Stand: Min guard, Min assist ?  ?  ?  ?  ?  ?General transfer comment: significantly increased time, no assist given with use of RW. minA with no DME to steady. max cues for use of LE and hip extension ?  ? ?Ambulation/Gait ?Ambulation/Gait assistance: Min guard ?Gait Distance (Feet): 200 Feet ?Assistive device: Rolling walker (2 wheels) ?Gait Pattern/deviations: Step-through pattern, Decreased stride length, Trunk flexed ?Gait velocity: 0.36 m/s ?Gait velocity interpretation: <1.31 ft/sec, indicative of household ambulator ?  ?General Gait Details: Slow, mildly unsteady gait, close Min guard, difficulty with turns. Cues for RW proximity and heavy WB through BUEs. cues for posture and relaxing shoulders also, pt maintaining postural cues for short time ? ? ? ? ?  ?Balance Overall balance assessment: Needs assistance ?Sitting-balance support: Feet supported, No upper extremity supported ?Sitting balance-Leahy Scale: Good ?  ?  ?Standing balance support: During functional activity, Reliant on assistive device for balance ?Standing balance-Leahy Scale: Poor ?Standing  balance comment: static stand  without UE support, BUE support for gait ?  ?  ?  ?  ?  ?  ?  ?  ?  ?  ?  ?  ? ?  ?Cognition Arousal/Alertness: Awake/alert ?Behavior During Therapy: [facility] for tasks assessed/performed ?Overall Cognitive Status: Impaired/Different from baseline ?Area of Impairment: Attention, Memory, Following commands, Awareness, Problem solving, Orientation ?  ?  ?  ?  ?  ?  ?  ?  ?Orientation Level: Place, Situation ?Current Attention Level: Sustained ?Memory: Decreased short-term memory ?Following Commands: Follows one step commands with increased time, Follows one step commands consistently ?  ?Awareness: Intellectual ?Problem Solving: Slow processing, Difficulty sequencing, Requires verbal cues, Requires tactile cues ?General Comments: pt with flat affect but pleasant and agreeable, able to follow all simple cues but at times needing increased cues and time to complete. unable to recall his room or direct back to his room even after given cues ?  ?  ? ?  ?Exercises Other Exercises ?Other Exercises: repeated sit-stand from EOB x 6. x5 with hands on knees and no RW. max cues for use of LE ? ?  ?General Comments General comments (skin integrity, edema, etc.): VSS on RA with SBP < 140 ?  ?  ? ?Pertinent Vitals/Pain Pain Assessment ?Pain Assessment: Faces ?Faces Pain Scale: Hurts a little bit ?Pain Location: BLEs-chronic ?Pain Descriptors / Indicators: Discomfort ?Pain Intervention(s): Monitored during session, Repositioned  ? ? ? ?PT Goals (current goals can now be found in the care plan section) Acute Rehab PT Goals ?Patient Stated Goal: get up ?PT Goal Formulation: With patient ?Time For Goal Achievement: 01/04/22 ?Potential to Achieve Goals: Fair ?Progress towards PT goals: Progressing toward goals ? ?  ?Frequency ? ? ? Min 3X/week ? ? ? ?  ?PT Plan Current plan remains appropriate  ? ? ?   ?AM-PAC PT "6 Clicks" Mobility   ?Outcome Measure ? Help needed turning from your back to your side while in a flat bed without using  bedrails?: A Little ?Help needed moving from lying on your back to sitting on the side of a flat bed without using bedrails?: A Little ?Help needed moving to and from a bed to a chair (including a wheelchair)?: A Little ?Help needed standing up from a chair using your arms (e.g., wheelchair or bedside chair)?: A Little ?Help needed to walk in hospital room?: A Little ?Help needed climbing 3-5 steps with a railing? : A Little ?6 Click Score: 18 ? ?  ?End of Session Equipment Utilized During Treatment: Gait belt ?Activity Tolerance: Patient tolerated treatment well ?Patient left: in bed;with call bell/phone within reach;with bed alarm set ?Nurse Communication: Mobility status ?PT Visit Diagnosis: Muscle weakness (generalized) (M62.81);Difficulty in walking, not elsewhere classified (R26.2);Unsteadiness on feet (R26.81) ?  ? ? ?Time: 3953-2023 ?PT Time Calculation (min) (ACUTE ONLY): 29 min ? ?Charges:  $Therapeutic Exercise: 23-37 mins          ?          ? ?West Carbo, PT, DPT  ? ?Acute Rehabilitation Department ?Pager #: [phone] - [phone] ? ? ?Sandra Cockayne ?12/25/2021, 9:41 AM ? ?

## 2021-12-25 NOTE — Progress Notes (Signed)
Mobility Specialist Progress Note: ? ? 12/25/21 1230  ?Mobility  ?Activity Ambulated with assistance in hallway  ?Level of Assistance Contact guard assist, steadying assist  ?Assistive Device Front wheel walker  ?Distance Ambulated (ft) 300 ft  ?Activity Response Tolerated well  ?$Mobility charge 1 Mobility  ? ?Pt eager for mobility. Required minA to stand from EOB, also cues for hand placement. Pt requiring minG-minA with gait for steadying. Pt left sitting EOB with all needs met, eating lunch.  ? ?Nelta Numbers ?Acute Rehab ?Phone: [phone] ?Office Phone: [phone] ? ?

## 2021-12-25 NOTE — Progress Notes (Signed)
Pt for d/c today. Contacted [facility] to inquire if pt could receive out-pt HD today or tomorrow. Advised by clinic manager that clinic is unable to provide treatment today or tomorrow due to staffing. Update provided to attending and nephrologist. Pt will d/c later today after HD. Will advise clinic that pt will d/c today and resume on Monday.  ? ?Melven Sartorius ?Renal Navigator ?[phone] ?

## 2021-12-25 NOTE — Plan of Care (Signed)
?  Problem: Education: ?Goal: Knowledge of General Education information will improve ?Description: Including pain rating scale, medication(s)/side effects and non-pharmacologic comfort measures ?Outcome: Progressing ?  ?Problem: Health Behavior/Discharge Planning: ?Goal: Ability to manage health-related needs will improve ?Outcome: Progressing ?  ?Problem: Clinical Measurements: ?Goal: Ability to maintain clinical measurements within normal limits will improve ?Outcome: Progressing ?Goal: Will remain free from infection ?Outcome: Progressing ?Goal: Diagnostic test results will improve ?Outcome: Progressing ?Goal: Respiratory complications will improve ?Outcome: Progressing ?Goal: Cardiovascular complication will be avoided ?Outcome: Progressing ?  ?Problem: Activity: ?Goal: Risk for activity intolerance will decrease ?Outcome: Progressing ?  ?Problem: Nutrition: ?Goal: Adequate nutrition will be maintained ?Outcome: Progressing ?  ?Problem: Coping: ?Goal: Level of anxiety will decrease ?Outcome: Progressing ?  ?Problem: Elimination: ?Goal: Will not experience complications related to bowel motility ?Outcome: Progressing ?Goal: Will not experience complications related to urinary retention ?Outcome: Progressing ?  ?Problem: Pain Managment: ?Goal: General experience of comfort will improve ?Outcome: Progressing ?  ?Problem: Safety: ?Goal: Ability to remain free from injury will improve ?Outcome: Progressing ?  ?Problem: Skin Integrity: ?Goal: Risk for impaired skin integrity will decrease ?Outcome: Progressing ? ?Patient currently in dialysis. Plan for discharge afterwards.  ?  ?

## 2021-12-25 NOTE — Care Management Important Message (Signed)
Important Message ? ?Patient Details  ?Name: Travis [name] ?MRN: [id number] ?Date of Birth: 1940-12-14 ? ? ?Medicare Important Message Given:  Yes ? ? ? ? ?Shelda Altes ?12/25/2021, 11:19 AM ?

## 2021-12-25 NOTE — Progress Notes (Signed)
Pt's wife is frustrated and wishes to speak with the MDs this morning. Pt's wife also very adamant that the patient needs to ambulate today. Will endorse to primary RN. ?

## 2021-12-25 NOTE — Discharge Summary (Signed)
?DISCHARGE SUMMARY ? ?Travis [name] ? ?MR#: [id number] ? ?DOB:[birth year]  ?Date of Admission: 12/18/2021 ?Date of Discharge: 12/25/2021 ? ?Attending Physician:Tyriana Helmkamp Hennie Duos, MD ? ?Patient's PYK:DXIPJAS, Dibas, MD ? ?Consults: ?PCCM ?Neurology ?Nephrology ? ?Disposition: D/C home  ? ?Follow-up Appts: ? Follow-up Information   ? ? Care, [facility] Follow up.   ?Specialty: Home Health Services ?Why: home health physical therapy, Home health occupational therapy- Agency will contact you with appointment times. ?Contact information: ?[address] ?[address] ?[place] [address] ?[phone] ? ? ?  ?  ? ?  ?  ? ?  ? ? ?Tests Needing Follow-up: ?-assess BP control ? ?Discharge Diagnoses: ?Hypertensive emergency ?Hypertensive encephalopathy ?Severe protein calorie malnutrition ?ESRD on HD ?CAD ? ?Initial presentation: ?81yo with a history of ESRD on HD, pulmonary fibrosis, chronic combined systolic and diastolic CHF, anemia of chronic kidney disease, HTN, and Sjogren's disease who presented to the ED with severe headache nausea and vomiting.  He was found to have systolic blood pressures in the 230s at presentation.  CT head was negative for acute findings.  He was started on Cleviprex and admitted by [facility].  With improvement in his condition he was able to be transferred to [facility] on 4/11. ? ?Hospital Course: ? ?Hypertensive emergency ?Presently resolved - was not taking amlodipine at home as prescribed - well controlled on amlodipine plus Coreg -Coreg dose being limited by bradycardia with heart rate stable at 60 at time of discharge ?  ?Hypertensive encephalopathy ?Due to above -was evaluated by Neurology at time of presentation with CT head revealing no acute findings -MRI brain reveals no evidence of acute CVA -expect mental status to improve further upon discharge to a familiar environment of home ?  ?Suspected aneurysm of cavernous segment right ICA - ruled out ?Noted incidentally on MRI brain - CTa  obtained and follow-up as suggested by radiology and revealed no evidence of aneurysm ?  ?Severe protein calorie malnutrition ?Diet liberalized -nutrition followed during this admission ?  ?ESRD on HD ?Care as per Nephrology ?  ?CAD ?Continue aspirin, statin, beta-blocker ?  ? ?Allergies as of 12/25/2021   ?No Active Allergies ?  ? ?  ?Medication List  ?  ? ?TAKE these medications   ? ?Accu-Chek Guide test strip ?Generic drug: glucose blood ?Use to test blood glucose four times daily as directed ?  ?Accu-Chek Guide w/Device Kit ?Use up to four times daily as directed. ?  ?Accu-Chek Softclix Lancets lancets ?Use to test blood glucose four times daily as directed. ?  ?amLODipine 10 MG tablet ?Commonly known as: NORVASC ?Take 1 tablet (10 mg total) by mouth daily. ?  ?aspirin 81 MG EC tablet ?Take 1 tablet (81 mg total) by mouth daily. ?  ?atorvastatin 40 MG tablet ?Commonly known as: LIPITOR ?Take 1 tablet (40 mg total) by mouth daily at 6 PM. ?  ?Brinzolamide-Brimonidine 1-0.2 % Susp ?Place 1 drop into both eyes in the morning, at noon, and at bedtime. ?  ?calcitRIOL 0.25 MCG capsule ?Commonly known as: ROCALTROL ?Take 13 capsules (3.25 mcg total) by mouth every Monday, Wednesday, and Friday with hemodialysis. ?  ?carvedilol 6.25 MG tablet ?Commonly known as: COREG ?Take 1 tablet (6.25 mg total) by mouth 2 (two) times daily with a meal. ?  ?cinacalcet 30 MG tablet ?Commonly known as: SENSIPAR ?Take 3 tablets (90 mg total) by mouth every Monday, Wednesday, and Friday with hemodialysis. ?  ?ferric gluconate 62.5 mg in sodium chloride 0.9 % 100  mL ?Inject 62.5 mg into the vein every Wednesday with hemodialysis. ?Start taking on: December 30, 2021 ?  ?latanoprost 0.005 % ophthalmic solution ?Commonly known as: XALATAN ?Place 1 drop into both eyes at bedtime. ?  ?losartan 25 MG tablet ?Commonly known as: COZAAR ?Take 1 tablet (25 mg total) by mouth at bedtime. ?  ?omeprazole 40 MG capsule ?Commonly known as: PRILOSEC ?Take 40  mg by mouth daily. ?  ?OXYGEN ?Inhale 2 L into the lungs daily. ?  ?sevelamer carbonate 800 MG tablet ?Commonly known as: RENVELA ?Take 2 tablets (1,600 mg total) by mouth 3 (three) times daily with meals. ?  ?timolol 0.5 % ophthalmic gel-forming ?Commonly known as: TIMOPTIC-XR ?Place 1 drop into both eyes daily. ?  ?vitamin B-12 500 MCG tablet ?Commonly known as: CYANOCOBALAMIN ?Take 1 tablet (500 mcg total) by mouth daily. ?  ? ?  ? ? ?Day of Discharge ?BP (!) 145/58 (BP Location: Left Arm)   Pulse 66   Temp 98.2 ?F (36.8 ?C) (Oral)   Resp 18   Ht _0  (1.778 m)   Wt 68.8 kg   SpO2 100%   BMI 21.77 kg/m?  ? ?Physical Exam: ?General: No acute respiratory distress ?Lungs: Clear to auscultation bilaterally without wheezes or crackles ?Cardiovascular: Regular rate and rhythm without murmur gallop or rub normal S1 and S2 ?Abdomen: Nontender, nondistended, soft, bowel sounds positive, no rebound, no ascites, no appreciable mass ?Extremities: No significant cyanosis, clubbing, or edema bilateral lower extremities ? ?Basic Metabolic Panel: ?Recent Labs  ?Lab 12/19/21 ?6950 12/19/21 ?7225 12/20/21 ?0615 12/21/21 ?7505 12/22/21 ?0725 12/23/21 ?1833 12/25/21 ?5825  ?NA  --    < > 136 135 132* 136 133*  ?K  --    < > 5.2* 5.7* 4.6 4.2 4.5  ?CL  --    < > 91* 89* 88* 97* 94*  ?CO2  --    < > _1 ?GLUCOSE  --    < > 122* 123* 101* 92 83  ?BUN  --    < > 30* 46* 68* 35* 35*  ?CREATININE 7.67*   < > 10.47* 12.75* 14.35* 10.18* 10.39*  ?CALCIUM  --    < > 9.8 9.2 8.1* 8.4* 9.1  ?MG 2.1  --  2.3  --   --   --   --   ?PHOS  --   --  7.0* 8.0* 7.6*  --   --   ? < > = values in this interval not displayed.  ? ? ?Liver Function Tests: ?Recent Labs  ?Lab 12/18/21 ?2011 12/21/21 ?1898 12/22/21 ?0725  ?AST 15  --   --   ?ALT 9  --   --   ?ALKPHOS 103  --   --   ?BILITOT 0.4  --   --   ?PROT 8.5*  --   --   ?ALBUMIN 4.2 3.5 3.5  ? ? ?CBC: ?Recent Labs  ?Lab 12/18/21 ?2011 12/18/21 ?2018 12/20/21 ?0615 12/21/21 ?4210  12/22/21 ?3128 12/23/21 ?1188 12/25/21 ?6773  ?WBC 7.5   < > 7.9 7.9 11.9* 9.2 9.0  ?NEUTROABS 4.3  --  5.8  --   --   --   --   ?HGB 12.4*   < > 12.6* 11.5* 11.7* 11.6* 12.3*  ?HCT 39.9   < > 39.1 36.4* 35.4* 36.0* 38.8*  ?MCV 98.0   < > 94.2 94.3 92.9 94.7 95.6  ?PLT 185   < > 195 200  211 182 176  ? < > = values in this interval not displayed.  ? ? ?Time spent in discharge (includes decision making & examination of pt): ?35 minutes ? ?12/25/2021, 10:50 AM  ? ?Cherene Altes, MD ?[facility] ?Office  [phone] ? ? ? ? ? ?

## 2021-12-25 NOTE — TOC Transition Note (Addendum)
Transition of Care (TOC) - CM/SW Discharge Note ? ? ?Patient Details  ?Name: Travis [name] ?MRN: [id number] ?Date of Birth: [birth year] ? ?Transition of Care (TOC) CM/SW Contact:  ?Zenon Mayo, RN ?Phone Number: ?12/25/2021, 11:38 AM ? ? ?Clinical Narrative:    ?Patient is for dc today, NCM notified Tommi Rumps with [facility] that patient is for dc today, also wife will make follow up apt for patient at the [place].  She will transport him home by car, she states she has pills for him to take today and she will go to the [place] tomorrow to get the others filled.  Patient will get HD here at the hospital then dc home. ? ? ?Final next level of care: [place] ?Barriers to Discharge: Continued Medical Work up ? ? ?Patient Goals and CMS Choice ?Patient states their goals for this hospitalization and ongoing recovery are:: return home ?CMS Medicare.gov Compare Post Acute Care list provided to:: Patient Represenative (must comment) ?Choice offered to / list presented to : Spouse ? ?Discharge Placement ?  ?           ?  ?  ?  ?  ? ?Discharge Plan and Services ?  ?Discharge Planning Services: CM Consult ?Post Acute Care Choice: NA          ?  ?DME Agency: NA ?  ?  ?  ?HH Arranged: PT, OT ?[place] Agency: [facility] ?Date HH Agency Contacted: 12/22/21 ?Time Rives: [id number] ?Representative spoke with at [place]: Tommi Rumps ? ?Social Determinants of Health (SDOH) Interventions ?  ? ? ?Readmission Risk Interventions ? ?  12/22/2021  ?  5:33 PM 05/26/2021  ? 12:03 PM 05/26/2021  ? 12:02 PM  ?Readmission Risk Prevention Plan  ?Transportation Screening Complete Complete Complete  ?PCP or Specialist Appt within 3-5 Days Complete Complete   ?[place] or Home Care Consult Complete Complete   ?Social Work Consult for [place] Planning/Counseling Complete Complete   ?Palliative Care Screening Not Applicable Not Applicable   ?Medication Review Press photographer) Complete Complete   ? ? ? ? ? ?

## 2022-01-28 ENCOUNTER — Telehealth (HOSPITAL_COMMUNITY): Payer: Self-pay | Admitting: Physician Assistant

## 2022-01-28 NOTE — Telephone Encounter (Signed)
I called to schedule yearly echocardiogram per Richardson Dopp and patient states he will have done at the [place]. Order will be removed from the echo WQ.

## 2022-04-08 DIAGNOSIS — N186 End stage renal disease: Secondary | ICD-10-CM | POA: Diagnosis not present

## 2022-04-08 DIAGNOSIS — I5022 Chronic systolic (congestive) heart failure: Secondary | ICD-10-CM | POA: Diagnosis not present

## 2022-04-08 DIAGNOSIS — J849 Interstitial pulmonary disease, unspecified: Secondary | ICD-10-CM | POA: Diagnosis not present

## 2022-04-08 DIAGNOSIS — E7800 Pure hypercholesterolemia, unspecified: Secondary | ICD-10-CM | POA: Diagnosis not present

## 2022-04-08 DIAGNOSIS — Z992 Dependence on renal dialysis: Secondary | ICD-10-CM | POA: Diagnosis not present

## 2022-04-08 DIAGNOSIS — R079 Chest pain, unspecified: Secondary | ICD-10-CM | POA: Diagnosis not present

## 2022-04-09 ENCOUNTER — Ambulatory Visit
Admission: RE | Admit: 2022-04-09 | Discharge: 2022-04-09 | Disposition: A | Discharge status: Home / Self Care | Payer: Medicare Other | Source: Ambulatory Visit | Attending: Family Medicine | Admitting: Family Medicine

## 2022-04-09 ENCOUNTER — Other Ambulatory Visit: Payer: Self-pay | Admitting: Family Medicine

## 2022-04-09 DIAGNOSIS — R52 Pain, unspecified: Secondary | ICD-10-CM

## 2022-04-09 DIAGNOSIS — R079 Chest pain, unspecified: Secondary | ICD-10-CM | POA: Diagnosis not present

## 2022-04-09 DIAGNOSIS — R0602 Shortness of breath: Secondary | ICD-10-CM | POA: Diagnosis not present

## 2022-04-19 NOTE — Progress Notes (Deleted)
Office Visit    Patient Name: Travis [name] Date of Encounter: 04/19/2022  Primary Care Provider:  [name], MD Primary Cardiologist:  Travis Bergeron, MD Primary Electrophysiologist: None  Chief Complaint    Travis [name] is a 81 y.o. male with PMH of ESRD on HD, pulmonary fibrosis, Sjogren's disease, HFpEF, GERD, HTN, DVT s/p IVC filter 2016, COPD, anemia who presents today for follow-up of congestive heart failure.  Past Medical History    Past Medical History:  Diagnosis Date   Acute on chronic respiratory failure with hypoxia ([place]) 01/28/2015   Anemia    Arthritis    Chronic combined systolic and diastolic CHF (congestive heart failure) ([place])    a. Etiology of low EF not defined.   Chronic respiratory failure (HCC)    DVT (deep venous thrombosis) ([place])    a. 01/2015: RLE DVT. VQ scan intermediate probability. Underwent renal bx complicated by perinephric hematoma; anticoagulation stopped and IVC filter placed.   ESRD on hemodialysis [facility]) June 2016   a. 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 Received plasma exchange and steroids but didn't respond and ended up starting hemodialysis June 2016.    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 04/2015 in HD in setting of BP 50s.   Unintentional weight loss    Wears partial dentures    Past Surgical History:  Procedure  Laterality Date   AV FISTULA PLACEMENT Right 02/17/2015   Procedure: INSERTION OF RIGHT ARM  ARTERIOVENOUS (AV) GORE-TEX GRAFT ;  Surgeon: Travis Dutch, MD;  Location: [place];  Service: Vascular;  Laterality: Right;   FLEXIBLE SIGMOIDOSCOPY N/A 08/04/2015   Procedure: FLEXIBLE SIGMOIDOSCOPY;  Surgeon: Travis Corner, MD;  Location: [facility] ENDOSCOPY;  Service: Endoscopy;  Laterality: N/A;   HEMORROIDECTOMY  1999   INSERTION OF DIALYSIS CATHETER N/A 02/17/2015   Procedure: INSERTION OF DIALYSIS CATHETER RIGHT INTERNAL JUGULAR VEIN;  Surgeon: Travis Dutch, MD;  Location: [place];  Service: Vascular;  Laterality: N/A;   MULTIPLE TOOTH EXTRACTIONS     PERIPHERAL VASCULAR BALLOON ANGIOPLASTY Right 01/23/2019   Procedure: PERIPHERAL VASCULAR BALLOON ANGIOPLASTY;  Surgeon: Travis Dutch, MD;  Location: [place] CV LAB;  Service: Cardiovascular;  Laterality: Right;   REVISION OF ARTERIOVENOUS GORETEX GRAFT Right 03/29/2017   Procedure: REVISION OF ARTERIOVENOUS GORETEX GRAFT;  Surgeon: Travis Romeo, MD;  Location: [facility] OR;  Service: Vascular;  Laterality: Right;   Surgical procedure to remove a mole as a child Right Eye area   At around 37 years old    Allergies  No Active Allergies  History of Present Illness    Travis [name] is a 81 year old male with the above-mentioned past medical history who presents today for follow-up of CHF and HTN.  Travis [name] was initially seen by Dr. Meda [name] for acute shortness of breath following dialysis treatment.  Troponins  were flat and EF was 35 to 40% with no RWMA, with mild MR.  He was seen in the ED on 04/2015 for syncope and hypotension.  He was then readmitted 07/2015 for bronchitis/pneumonia.  He was treated for sepsis and found to have pericardial effusion without tamponade.  He was treated with colchicine and follow-up TEE showed interval change.  He was being worked up for kidney transplant at the end of 2017.  He was admitted in 2017 for complaint of  chest pain for complaint of chest pain.  Serial high-sensitivity troponins 24>>65.  His chest pain resolved after 3 baby aspirin and patient was was discharged with plan for stress test work-up in outpatient setting.  Stress test was completed at [facility] that showed normal perfusion and EF of 59.  2D echo was completed 11/2020 that also revealed EF 60-65%, no RWMA, mild LH grade 2 DD mild LAE, trivial MR, mild to moderate TR, mild AI, mild AAS (mean 12 mmHg.  Travis [name] was admitted on 12/18/2021 for hypertensive emergency and hypertensive encephalopathy.  His systolic blood pressure was in the 230s and head CT was negative with no acute findings.  He was discharged once blood pressure was controlled and directed to take home BP medications as prescribed.   Since last being seen in the office patient reports***.  Patient denies chest pain, palpitations, dyspnea, PND, orthopnea, nausea, vomiting, dizziness, syncope, edema, weight gain, or early satiety.     ***Notes: -Last 2D echo was 11/2020 will repeat if not completed  Recent hospitalization for hypertensive emergency and encephalopathy -Current blood pressure medication Norvasc 10 mg, carvedilol 6.25 mg twice daily, losartan 25 mg  Home Medications    Current Outpatient Medications  Medication Sig Dispense Refill   Accu-Chek Softclix Lancets lancets Use to test blood glucose four times daily as directed. 100 each 1   amLODipine (NORVASC) 10 MG tablet Take 1 tablet (10 mg total) by mouth daily. (Patient not taking: Reported on 12/19/2021) 30 tablet 0   aspirin EC 81 MG EC tablet Take 1 tablet (81 mg total) by mouth daily. 30 tablet 0   atorvastatin (LIPITOR) 40 MG tablet Take 1 tablet (40 mg total) by mouth daily at 6 PM. 30 tablet 0   Blood Glucose Monitoring Suppl (BLOOD GLUCOSE MONITOR SYSTEM) w/Device KIT Use up to four times daily as directed. 1 kit 1   Brinzolamide-Brimonidine 1-0.2 % SUSP Place 1 drop into both eyes in the morning,  at noon, and at bedtime.     calcitRIOL (ROCALTROL) 0.25 MCG capsule Take 13 capsules (3.25 mcg total) by mouth every Monday, Wednesday, and Friday with hemodialysis.     carvedilol (COREG) 6.25 MG tablet Take 1 tablet (6.25 mg total) by mouth 2 (two) times daily with a meal. 60 tablet 1   cinacalcet (SENSIPAR) 30 MG tablet Take 3 tablets (90 mg total) by mouth every Monday, Wednesday, and Friday with hemodialysis. 60 tablet    cyanocobalamin 500 MCG tablet Take 1 tablet (500 mcg total) by mouth daily. 30 tablet 0   ferric gluconate 62.5 mg in sodium chloride 0.9 % 100 mL Inject 62.5 mg into the vein every Wednesday with hemodialysis.     glucose blood test strip Use to test blood glucose four times daily as directed 100 each 1   latanoprost (XALATAN) 0.005 % ophthalmic solution Place 1 drop into both eyes at bedtime.     losartan (COZAAR) 25 MG tablet Take 1 tablet (25 mg total) by  mouth at bedtime. 30 tablet 1   omeprazole (PRILOSEC) 40 MG capsule Take 40 mg by mouth daily.  11   OXYGEN Inhale 2 L into the lungs daily.     sevelamer carbonate (RENVELA) 800 MG tablet Take 2 tablets (1,600 mg total) by mouth 3 (three) times daily with meals. (Patient not taking: Reported on 12/19/2021) 90 tablet 0   timolol (TIMOPTIC-XR) 0.5 % ophthalmic gel-forming Place 1 drop into both eyes daily.     No current facility-administered medications for this visit.     Review of Systems  Please see the history of present illness.    (+)*** (+)***  All other systems reviewed and are otherwise negative except as noted above.  Physical Exam    Wt Readings from Last 3 Encounters:  12/25/21 154 lb 5.2 oz (70 kg)  05/26/21 171 lb 11.8 oz (77.9 kg)  03/10/21 165 lb 6.4 oz (75 kg)   Travis [name] were no vitals filed for this visit.,There is no height or weight on file to calculate BMI.  Constitutional:      Appearance: Healthy appearance. Not in distress.  Neck:     Vascular: JVD normal.  Pulmonary:      Effort: Pulmonary effort is normal.     Breath sounds: No wheezing. No rales. Diminished in the bases Cardiovascular:     Normal rate. Regular rhythm. Normal S1. Normal S2.      Murmurs: There is no murmur.  Edema:    Peripheral edema absent.  Abdominal:     Palpations: Abdomen is soft non tender. There is no hepatomegaly.  Skin:    General: Skin is warm and dry.  Neurological:     General: No focal deficit present.     Mental Status: Alert and oriented to person, place and time.     Cranial Nerves: Cranial nerves are intact.  EKG/LABS/Other Studies Reviewed    ECG personally reviewed by me today - ***  Risk Assessment/Calculations:   {Does this patient have ATRIAL FIBRILLATION?:[id number]}        Lab Results  Component Value Date   WBC 9.0 12/25/2021   HGB 12.3 (L) 12/25/2021   HCT 38.8 (L) 12/25/2021   MCV 95.6 12/25/2021   PLT 176 12/25/2021   Lab Results  Component Value Date   CREATININE 10.39 (H) 12/25/2021   BUN 35 (H) 12/25/2021   NA 133 (L) 12/25/2021   K 4.5 12/25/2021   CL 94 (L) 12/25/2021   CO2 26 12/25/2021   Lab Results  Component Value Date   ALT 9 12/18/2021   AST 15 12/18/2021   ALKPHOS 103 12/18/2021   BILITOT 0.4 12/18/2021   Lab Results  Component Value Date   CHOL 117 09/09/2016   HDL 43 09/09/2016   LDLCALC 36 09/09/2016   TRIG 67 12/23/2021   CHOLHDL 2.7 09/09/2016    Lab Results  Component Value Date   HGBA1C 4.7 (L) 05/21/2021    Assessment & Plan    1.  HFpEF/ Aortic Valve stenosis:  -Last echo was completed 11/2020 in our system normal LVEF 27-25%, grade 2 diastolic dysfunction   -Volume is managed by dialysis  2.  Coronary artery disease: -Current GDMT consist of ASA 81 mg, Lipitor 40 mg, carvedilol six-point6.25 mg twice daily - 3.  Essential hypertension: -Patient's blood pressure today was***  4.  ESRD on dialysis: -Patient currently on M, W, F schedule -Continue per nephrology   5.   Hyperlipidemia -Patient's last LDL was 87  still slightly elevated from goal of less than 70 -      Disposition: Follow-up with Travis Bergeron, MD or APP in *** months {Are you ordering a CV Procedure (e.g. stress test, cath, DCCV, TEE, etc)?   Press F2        :[id number]}   Medication Adjustments/Labs and Tests Ordered: Current medicines are reviewed at length with the patient today.  Concerns regarding medicines are outlined above.   Signed, Mable Fill, Marissa Nestle, NP 04/19/2022, 4:41 PM [place]

## 2022-04-20 ENCOUNTER — Ambulatory Visit: Payer: Medicare Other | Admitting: Nurse Practitioner

## 2022-05-23 NOTE — Progress Notes (Deleted)
Cardiology Office Note:    Date:  05/23/2022   ID:  Travis [name], DOB 1940-09-15, MRN [id number]  PCP:  Travis Amel, MD  Cardiologist:  Travis Bergeron, MD   Referring MD: Travis Amel, MD   No chief complaint on file.   History of Present Illness:    Travis [name] is a 81 y.o. 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 Pulmonary fibrosis Sjogren's disease (HFpEF) heart failure with preserved ejection fraction Hx of large pericardial effusion in setting of severe sepsis 10/2015 >> resolved on f/u echos GERD Hypertension Hx of DVT in 5/16 s/p IVC filter anticoagulation DCd 2/2 to perinephric hematoma Chronic respiratory failure on home O2 COPD Anemia Hx of R leg trauma ([place] War combat injury)  Lung nodule (per patient)  ***  Past Medical History:  Diagnosis Date   Acute on chronic respiratory failure with hypoxia ([place]) 01/28/2015   Anemia    Arthritis    Chronic combined systolic and diastolic CHF (congestive heart failure) ([place])    a. Etiology of low EF not defined.   Chronic respiratory failure (HCC)    DVT (deep venous thrombosis) ([place])    a. 01/2015: RLE DVT. VQ scan intermediate probability. Underwent renal bx complicated by perinephric hematoma; anticoagulation stopped and IVC filter placed.   ESRD on hemodialysis [facility]) June 2016   a. 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 Received plasma exchange and steroids but didn't respond and ended up starting hemodialysis June 2016.    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 04/2015 in HD in setting of BP 50s.   Unintentional weight loss    Wears partial dentures     Past Surgical History:  Procedure Laterality Date   AV FISTULA PLACEMENT Right 02/17/2015   Procedure: INSERTION OF RIGHT ARM  ARTERIOVENOUS (AV) GORE-TEX GRAFT ;  Surgeon: Elam Dutch, MD;  Location: [place];  Service: Vascular;  Laterality: Right;   FLEXIBLE SIGMOIDOSCOPY N/A 08/04/2015   Procedure: FLEXIBLE SIGMOIDOSCOPY;  Surgeon: Wilford Corner, MD;  Location: [facility] ENDOSCOPY;  Service: Endoscopy;  Laterality: N/A;   HEMORROIDECTOMY  1999   INSERTION OF DIALYSIS CATHETER N/A 02/17/2015   Procedure: INSERTION OF DIALYSIS CATHETER RIGHT INTERNAL JUGULAR VEIN;  Surgeon: Elam Dutch, MD;  Location: [place];  Service: Vascular;  Laterality: N/A;   MULTIPLE TOOTH EXTRACTIONS     PERIPHERAL VASCULAR BALLOON ANGIOPLASTY Right 01/23/2019   Procedure: PERIPHERAL VASCULAR BALLOON ANGIOPLASTY;  Surgeon: Elam Dutch, MD;  Location: [place] CV LAB;  Service: Cardiovascular;  Laterality: Right;   REVISION OF ARTERIOVENOUS GORETEX GRAFT Right 03/29/2017   Procedure: REVISION OF ARTERIOVENOUS GORETEX GRAFT;  Surgeon: Conrad St. Ann, MD;  Location: [place];  Service: Vascular;  Laterality: Right;   Surgical procedure to remove a mole as a child Right Eye area   At  around 61 years old    Current Medications: No outpatient medications have been marked as taking for the 05/24/22 encounter (Appointment) with Belva Crome, MD.     Allergies:   Patient has no active allergies.   Social History   Socioeconomic History   Marital status: Married    Spouse name: Not on file   Number of children: Not on file   Years of education: Not on file   Highest education level: Not on file  Occupational History    Not on file  Tobacco Use   Smoking status: Never   Smokeless tobacco: Never  Vaping Use   Vaping Use: Never used  Substance and Sexual Activity   Alcohol use: No    Alcohol/week: 0.0 standard drinks of alcohol   Drug use: No   Sexual activity: Not Currently  Other Topics Concern   Not on file  Social History Narrative   Not on file   Social Determinants of Health   Financial Resource Strain: Not on file  Food Insecurity: Not on file  Transportation Needs: Not on file  Physical Activity: Not on file  Stress: Not on file  Social Connections: Not on file     Family History: The patient's family history includes Healthy in his father; Hypertension in his brother, mother, and sister.  ROS:   Please see the history of present illness.    *** All other systems reviewed and are negative.  EKGs/Labs/Other Studies Reviewed:    The following studies were reviewed today:  2 D Doppler ECHOCARDIOGRAM 11/11/2020: IMPRESSIONS   1. Left ventricular ejection fraction, by estimation, is 60 to 65%. The  left ventricle has normal function. The left ventricle has no regional  wall motion abnormalities. There is mild concentric left ventricular  hypertrophy. Left ventricular diastolic  parameters are consistent with Grade II diastolic dysfunction  (pseudonormalization). Elevated left ventricular end-diastolic pressure.   2. Right ventricular systolic function is normal. The right ventricular  size is normal. There is normal pulmonary artery systolic pressure.   3. Left atrial size was mildly dilated.   4. The mitral valve is normal in structure. Trivial mitral valve  regurgitation. No evidence of mitral stenosis.   5. Tricuspid valve regurgitation is mild to moderate.   6. The aortic valve is calcified. There is mild calcification of the  aortic valve. There is mild thickening of the aortic valve. Aortic valve  regurgitation is mild. Mild aortic valve stenosis. Aortic valve area, by  VTI  measures 1.78 cm. Aortic valve  mean gradient measures 12.0 mmHg. Aortic valve Vmax measures 2.40 m/s.   7. The inferior vena cava is normal in size with greater than 50%  respiratory variability, suggesting right atrial pressure of 3 mmHg.   EKG:  EKG ***  Recent Labs: 12/18/2021: ALT 9 12/20/2021: Magnesium 2.3 12/25/2021: BUN 35; Creatinine, Ser 10.39; Hemoglobin 12.3; Platelets 176; Potassium 4.5; Sodium 133  Recent Lipid Panel    Component Value Date/Time   CHOL 117 09/09/2016 1551   TRIG 67 12/23/2021 0442   HDL 43 09/09/2016 1551   CHOLHDL 2.7 09/09/2016 1551   VLDL 38 (H) 09/09/2016 1551   LDLCALC 36 09/09/2016 1551    Physical Exam:    VS:  There were no vitals taken for this visit.    Wt Readings from Last 3 Encounters:  12/25/21 154 lb 5.2 oz (70 kg)  05/26/21 171 lb 11.8 oz (77.9 kg)  03/10/21 165 lb 6.4 oz (75 kg)  GEN: ***. No acute distress HEENT: Normal NECK: No JVD. LYMPHATICS: No lymphadenopathy CARDIAC: *** murmur. RRR *** gallop, or edema. VASCULAR: *** Normal Pulses. No bruits. RESPIRATORY:  Clear to auscultation without rales, wheezing or rhonchi  ABDOMEN: Soft, non-tender, non-distended, No pulsatile mass, MUSCULOSKELETAL: No deformity  SKIN: Warm and dry NEUROLOGIC:  Alert and oriented x 3 PSYCHIATRIC:  Normal affect   ASSESSMENT:    1. Nonrheumatic aortic valve stenosis   2. Precordial chest pain   3. Chronic heart failure with preserved ejection fraction ([place])   4. ESRD on dialysis ([place])   5. Chronic obstructive pulmonary disease, unspecified COPD type ([place])   6. 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  Concerns regarding medicines are outlined above.  No orders of the defined types were placed in this  encounter.  No orders of the defined types were placed in this encounter.   There are no Patient Instructions on file for this visit.   Signed, Sinclair Grooms, MD  05/23/2022 4:22 PM    [facility]

## 2022-05-24 ENCOUNTER — Ambulatory Visit: Payer: Medicare Other | Admitting: Interventional Cardiology

## 2022-05-24 DIAGNOSIS — N186 End stage renal disease: Secondary | ICD-10-CM

## 2022-05-24 DIAGNOSIS — I825Z9 Chronic embolism and thrombosis of unspecified deep veins of unspecified distal lower extremity: Secondary | ICD-10-CM

## 2022-05-24 DIAGNOSIS — I350 Nonrheumatic aortic (valve) stenosis: Secondary | ICD-10-CM

## 2022-05-24 DIAGNOSIS — J449 Chronic obstructive pulmonary disease, unspecified: Secondary | ICD-10-CM

## 2022-05-24 DIAGNOSIS — I5032 Chronic diastolic (congestive) heart failure: Secondary | ICD-10-CM

## 2022-05-24 DIAGNOSIS — R072 Precordial pain: Secondary | ICD-10-CM

## 2022-06-25 ENCOUNTER — Encounter (HOSPITAL_COMMUNITY): Payer: Self-pay | Admitting: *Deleted

## 2022-06-25 ENCOUNTER — Other Ambulatory Visit: Payer: Self-pay

## 2022-06-25 ENCOUNTER — Emergency Department (HOSPITAL_COMMUNITY)
Admission: EM | Admit: 2022-06-25 | Discharge: 2022-06-25 | Discharge status: Against Medical Advice | Payer: Medicare Other | Attending: Student | Admitting: Student

## 2022-06-25 DIAGNOSIS — Z992 Dependence on renal dialysis: Secondary | ICD-10-CM | POA: Insufficient documentation

## 2022-06-25 DIAGNOSIS — Z5321 Procedure and treatment not carried out due to patient leaving prior to being seen by health care provider: Secondary | ICD-10-CM | POA: Diagnosis not present

## 2022-06-25 DIAGNOSIS — Z743 Need for continuous supervision: Secondary | ICD-10-CM | POA: Diagnosis not present

## 2022-06-25 DIAGNOSIS — N186 End stage renal disease: Secondary | ICD-10-CM | POA: Diagnosis not present

## 2022-06-25 DIAGNOSIS — I10 Essential (primary) hypertension: Secondary | ICD-10-CM | POA: Diagnosis not present

## 2022-06-25 DIAGNOSIS — R519 Headache, unspecified: Secondary | ICD-10-CM | POA: Insufficient documentation

## 2022-06-25 DIAGNOSIS — G4489 Other headache syndrome: Secondary | ICD-10-CM | POA: Diagnosis not present

## 2022-06-25 LAB — CBC WITH DIFFERENTIAL/PLATELET
Abs Immature Granulocytes: 0.05 10*3/uL (ref 0.00–0.07)
Basophils Absolute: 0.1 10*3/uL (ref 0.0–0.1)
Basophils Relative: 1 %
Eosinophils Absolute: 0.4 10*3/uL (ref 0.0–0.5)
Eosinophils Relative: 4 %
HCT: 32.8 % — ABNORMAL LOW (ref 39.0–52.0)
Hemoglobin: 10.3 g/dL — ABNORMAL LOW (ref 13.0–17.0)
Immature Granulocytes: 1 %
Lymphocytes Relative: 13 %
Lymphs Abs: 1.3 10*3/uL (ref 0.7–4.0)
MCH: 30.7 pg (ref 26.0–34.0)
MCHC: 31.4 g/dL (ref 30.0–36.0)
MCV: 97.9 fL (ref 80.0–100.0)
Monocytes Absolute: 1.3 10*3/uL — ABNORMAL HIGH (ref 0.1–1.0)
Monocytes Relative: 14 %
Neutro Abs: 6.4 10*3/uL (ref 1.7–7.7)
Neutrophils Relative %: 67 %
Platelets: 210 10*3/uL (ref 150–400)
RBC: 3.35 MIL/uL — ABNORMAL LOW (ref 4.22–5.81)
RDW: 14.6 % (ref 11.5–15.5)
WBC: 9.5 10*3/uL (ref 4.0–10.5)
nRBC: 0 % (ref 0.0–0.2)

## 2022-06-25 LAB — BASIC METABOLIC PANEL
Anion gap: 14 (ref 5–15)
BUN: 11 mg/dL (ref 8–23)
CO2: 30 mmol/L (ref 22–32)
Calcium: 9.8 mg/dL (ref 8.9–10.3)
Chloride: 94 mmol/L — ABNORMAL LOW (ref 98–111)
Creatinine, Ser: 6.31 mg/dL — ABNORMAL HIGH (ref 0.61–1.24)
GFR, Estimated: 8 mL/min — ABNORMAL LOW (ref >60)
Glucose, Bld: 104 mg/dL — ABNORMAL HIGH (ref 70–99)
Potassium: 4 mmol/L (ref 3.5–5.1)
Sodium: 138 mmol/L (ref 135–145)

## 2022-06-25 LAB — MAGNESIUM: Magnesium: 2 mg/dL (ref 1.7–2.4)

## 2022-06-25 NOTE — ED Provider Triage Note (Signed)
Emergency Medicine Provider Triage Evaluation Note  Travis [name] , a 81 y.o. male  was evaluated in triage.  Pt complains of headaches, going on since Tuesday, states intermittent, states he feels pain in the middle of his head, no change in vision no paresthesia or weakness in the upper or lower extremities, no recent head trauma, not anticoag's.  Patient states that he has had headaches like this in the past is not the worst morning he has had, he goes to dialysis Monday Wednesday Friday had dialysis done Tuesday Wednesday and today, recent cataract surgery on Monday..  Review of Systems  Positive: Headaches, high blood pressure Negative: Chest pain, shortness of breath  Physical Exam  BP (!) 191/87 (BP Location: Left Arm)   Pulse 78   Resp 15   Ht 5\' 10"  (1.778 m)   Wt 70 kg   SpO2 96%   BMI 22.14 kg/m  Gen:   Awake, no distress   Resp:  Normal effort  MSK:   Moves extremities without difficulty  Other:  Cranial nerves II through XII grossly intact no difficulty with word finding following two-step commands no unilateral weakness present. Medical Decision Making  Medically screening exam initiated at 7:03 PM.  Appropriate orders placed.  Travis [name] was informed that the remainder of the evaluation will be completed by another provider, this initial triage assessment does not replace that evaluation, and the importance of remaining in the ED until their evaluation is complete.  Lab work imaging ordered will need further work-up.   Marcello Fennel, PA-C 06/25/22 1905

## 2022-06-25 NOTE — ED Notes (Signed)
Pt stated they wanted to leave and HS tried to have them stay. Pt and family decided to leave.

## 2022-06-25 NOTE — ED Triage Notes (Signed)
PER EMS: pt is from home with c/o headache that started yesterday, worsened today after he finished his dialysis. He states he thinks he is dehydrated. A&OX4. No weakness/numbness.  BP- 128/84, HR-82, O2-96% room air

## 2022-06-29 ENCOUNTER — Ambulatory Visit: Payer: Medicare Other | Admitting: Interventional Cardiology

## 2022-06-29 DIAGNOSIS — I5022 Chronic systolic (congestive) heart failure: Secondary | ICD-10-CM

## 2022-06-29 DIAGNOSIS — N186 End stage renal disease: Secondary | ICD-10-CM

## 2022-06-29 DIAGNOSIS — R0600 Dyspnea, unspecified: Secondary | ICD-10-CM

## 2022-06-29 DIAGNOSIS — D696 Thrombocytopenia, unspecified: Secondary | ICD-10-CM

## 2022-06-29 DIAGNOSIS — I161 Hypertensive emergency: Secondary | ICD-10-CM

## 2022-06-30 ENCOUNTER — Telehealth: Payer: Self-pay

## 2022-06-30 NOTE — Telephone Encounter (Signed)
     Patient  visit on 10/13  at [facility]   Have you been able to follow up with your primary care physician? yes  The patient was or was not able to obtain any needed medicine or equipment. yes   Are there diet recommendations that you are having difficulty following? na  Patient expresses understanding of discharge instructions and education provided has no other needs at this time. yes     [place], Care Management  [phone] [address], [place], [place] [address] Phone: [phone] Email: [name]

## 2022-07-04 ENCOUNTER — Emergency Department (HOSPITAL_COMMUNITY): Payer: Medicare Other

## 2022-07-04 ENCOUNTER — Encounter (HOSPITAL_BASED_OUTPATIENT_CLINIC_OR_DEPARTMENT_OTHER): Payer: Self-pay | Admitting: Emergency Medicine

## 2022-07-04 ENCOUNTER — Other Ambulatory Visit: Payer: Self-pay

## 2022-07-04 ENCOUNTER — Emergency Department (HOSPITAL_BASED_OUTPATIENT_CLINIC_OR_DEPARTMENT_OTHER): Payer: Medicare Other

## 2022-07-04 ENCOUNTER — Inpatient Hospital Stay (HOSPITAL_BASED_OUTPATIENT_CLINIC_OR_DEPARTMENT_OTHER)
Admission: EM | Admit: 2022-07-04 | Discharge: 2022-07-08 | DRG: 286 | Disposition: A | Discharge status: Home Health | Payer: Medicare Other | Attending: Internal Medicine | Admitting: Internal Medicine

## 2022-07-04 ENCOUNTER — Emergency Department (HOSPITAL_COMMUNITY)
Admission: EM | Admit: 2022-07-04 | Discharge: 2022-07-04 | Discharge status: Against Medical Advice | Payer: Medicare Other | Source: Home / Self Care

## 2022-07-04 ENCOUNTER — Encounter (HOSPITAL_COMMUNITY): Payer: Self-pay

## 2022-07-04 DIAGNOSIS — R6889 Other general symptoms and signs: Secondary | ICD-10-CM | POA: Diagnosis not present

## 2022-07-04 DIAGNOSIS — F0390 Unspecified dementia without behavioral disturbance: Secondary | ICD-10-CM | POA: Diagnosis present

## 2022-07-04 DIAGNOSIS — I2583 Coronary atherosclerosis due to lipid rich plaque: Secondary | ICD-10-CM | POA: Diagnosis not present

## 2022-07-04 DIAGNOSIS — Z79899 Other long term (current) drug therapy: Secondary | ICD-10-CM | POA: Diagnosis not present

## 2022-07-04 DIAGNOSIS — Z9981 Dependence on supplemental oxygen: Secondary | ICD-10-CM

## 2022-07-04 DIAGNOSIS — R059 Cough, unspecified: Secondary | ICD-10-CM | POA: Insufficient documentation

## 2022-07-04 DIAGNOSIS — I132 Hypertensive heart and chronic kidney disease with heart failure and with stage 5 chronic kidney disease, or end stage renal disease: Principal | ICD-10-CM | POA: Diagnosis present

## 2022-07-04 DIAGNOSIS — Z8249 Family history of ischemic heart disease and other diseases of the circulatory system: Secondary | ICD-10-CM

## 2022-07-04 DIAGNOSIS — R079 Chest pain, unspecified: Secondary | ICD-10-CM | POA: Insufficient documentation

## 2022-07-04 DIAGNOSIS — I440 Atrioventricular block, first degree: Secondary | ICD-10-CM | POA: Diagnosis not present

## 2022-07-04 DIAGNOSIS — K219 Gastro-esophageal reflux disease without esophagitis: Secondary | ICD-10-CM | POA: Diagnosis present

## 2022-07-04 DIAGNOSIS — Z992 Dependence on renal dialysis: Secondary | ICD-10-CM

## 2022-07-04 DIAGNOSIS — Z86718 Personal history of other venous thrombosis and embolism: Secondary | ICD-10-CM

## 2022-07-04 DIAGNOSIS — D649 Anemia, unspecified: Secondary | ICD-10-CM | POA: Diagnosis not present

## 2022-07-04 DIAGNOSIS — I214 Non-ST elevation (NSTEMI) myocardial infarction: Secondary | ICD-10-CM | POA: Insufficient documentation

## 2022-07-04 DIAGNOSIS — R7989 Other specified abnormal findings of blood chemistry: Secondary | ICD-10-CM | POA: Diagnosis not present

## 2022-07-04 DIAGNOSIS — Z5321 Procedure and treatment not carried out due to patient leaving prior to being seen by health care provider: Secondary | ICD-10-CM | POA: Insufficient documentation

## 2022-07-04 DIAGNOSIS — Z743 Need for continuous supervision: Secondary | ICD-10-CM | POA: Diagnosis not present

## 2022-07-04 DIAGNOSIS — N2581 Secondary hyperparathyroidism of renal origin: Secondary | ICD-10-CM | POA: Diagnosis not present

## 2022-07-04 DIAGNOSIS — J811 Chronic pulmonary edema: Secondary | ICD-10-CM | POA: Diagnosis not present

## 2022-07-04 DIAGNOSIS — I25110 Atherosclerotic heart disease of native coronary artery with unstable angina pectoris: Secondary | ICD-10-CM | POA: Diagnosis not present

## 2022-07-04 DIAGNOSIS — I5043 Acute on chronic combined systolic (congestive) and diastolic (congestive) heart failure: Secondary | ICD-10-CM | POA: Diagnosis present

## 2022-07-04 DIAGNOSIS — J8410 Pulmonary fibrosis, unspecified: Secondary | ICD-10-CM | POA: Diagnosis present

## 2022-07-04 DIAGNOSIS — J90 Pleural effusion, not elsewhere classified: Secondary | ICD-10-CM | POA: Diagnosis not present

## 2022-07-04 DIAGNOSIS — J9611 Chronic respiratory failure with hypoxia: Secondary | ICD-10-CM | POA: Diagnosis not present

## 2022-07-04 DIAGNOSIS — Z7982 Long term (current) use of aspirin: Secondary | ICD-10-CM | POA: Diagnosis not present

## 2022-07-04 DIAGNOSIS — I2510 Atherosclerotic heart disease of native coronary artery without angina pectoris: Secondary | ICD-10-CM

## 2022-07-04 DIAGNOSIS — M3500 Sicca syndrome, unspecified: Secondary | ICD-10-CM | POA: Diagnosis present

## 2022-07-04 DIAGNOSIS — I10 Essential (primary) hypertension: Secondary | ICD-10-CM | POA: Diagnosis not present

## 2022-07-04 DIAGNOSIS — D631 Anemia in chronic kidney disease: Secondary | ICD-10-CM | POA: Diagnosis not present

## 2022-07-04 DIAGNOSIS — R9431 Abnormal electrocardiogram [ECG] [EKG]: Secondary | ICD-10-CM | POA: Diagnosis not present

## 2022-07-04 DIAGNOSIS — I272 Pulmonary hypertension, unspecified: Secondary | ICD-10-CM | POA: Diagnosis present

## 2022-07-04 DIAGNOSIS — R0789 Other chest pain: Secondary | ICD-10-CM | POA: Diagnosis not present

## 2022-07-04 DIAGNOSIS — N186 End stage renal disease: Secondary | ICD-10-CM | POA: Diagnosis not present

## 2022-07-04 DIAGNOSIS — I5033 Acute on chronic diastolic (congestive) heart failure: Secondary | ICD-10-CM | POA: Diagnosis present

## 2022-07-04 DIAGNOSIS — I252 Old myocardial infarction: Secondary | ICD-10-CM

## 2022-07-04 DIAGNOSIS — I249 Acute ischemic heart disease, unspecified: Principal | ICD-10-CM

## 2022-07-04 DIAGNOSIS — R519 Headache, unspecified: Secondary | ICD-10-CM | POA: Diagnosis not present

## 2022-07-04 LAB — BASIC METABOLIC PANEL
Anion gap: 14 (ref 5–15)
BUN: 27 mg/dL — ABNORMAL HIGH (ref 8–23)
CO2: 30 mmol/L (ref 22–32)
Calcium: 9 mg/dL (ref 8.9–10.3)
Chloride: 92 mmol/L — ABNORMAL LOW (ref 98–111)
Creatinine, Ser: 9.44 mg/dL — ABNORMAL HIGH (ref 0.61–1.24)
GFR, Estimated: 5 mL/min — ABNORMAL LOW (ref >60)
Glucose, Bld: 110 mg/dL — ABNORMAL HIGH (ref 70–99)
Potassium: 3.8 mmol/L (ref 3.5–5.1)
Sodium: 136 mmol/L (ref 135–145)

## 2022-07-04 LAB — TROPONIN I (HIGH SENSITIVITY)
Troponin I (High Sensitivity): 130 ng/L (ref <18)
Troponin I (High Sensitivity): 137 ng/L (ref <18)
Troponin I (High Sensitivity): 120 ng/L (ref <18)

## 2022-07-04 LAB — CBC
HCT: 31.3 % — ABNORMAL LOW (ref 39.0–52.0)
Hemoglobin: 10 g/dL — ABNORMAL LOW (ref 13.0–17.0)
MCH: 30.7 pg (ref 26.0–34.0)
MCHC: 31.9 g/dL (ref 30.0–36.0)
MCV: 96 fL (ref 80.0–100.0)
Platelets: 309 10*3/uL (ref 150–400)
RBC: 3.26 MIL/uL — ABNORMAL LOW (ref 4.22–5.81)
RDW: 14.8 % (ref 11.5–15.5)
WBC: 10.1 10*3/uL (ref 4.0–10.5)
nRBC: 0 % (ref 0.0–0.2)

## 2022-07-04 LAB — PROTIME-INR
INR: 1 (ref 0.8–1.2)
Prothrombin Time: 13.1 seconds (ref 11.4–15.2)

## 2022-07-04 LAB — HEPARIN LEVEL (UNFRACTIONATED): Heparin Unfractionated: 0.4 IU/mL (ref 0.30–0.70)

## 2022-07-04 MED ORDER — SEVELAMER CARBONATE 800 MG PO TABS
1600.0000 mg | ORAL_TABLET | Freq: Three times a day (TID) | ORAL | Status: DC
  Administered 2022-07-05: 1600 mg via ORAL
  Filled 2022-07-04: qty 2

## 2022-07-04 MED ORDER — ASPIRIN 81 MG PO CHEW
81.0000 mg | CHEWABLE_TABLET | Freq: Every day | ORAL | Status: DC
  Administered 2022-07-04 – 2022-07-08 (×5): 81 mg via ORAL
  Filled 2022-07-04 (×5): qty 1

## 2022-07-04 MED ORDER — LOSARTAN POTASSIUM 25 MG PO TABS
25.0000 mg | ORAL_TABLET | Freq: Every day | ORAL | Status: DC
  Administered 2022-07-04 – 2022-07-07 (×4): 25 mg via ORAL
  Filled 2022-07-04 (×4): qty 1

## 2022-07-04 MED ORDER — HEPARIN (PORCINE) 25000 UT/250ML-% IV SOLN
1300.0000 [IU]/h | INTRAVENOUS | Status: DC
Start: 2022-07-04 — End: 2022-07-06
  Administered 2022-07-04: 900 [IU]/h via INTRAVENOUS
  Administered 2022-07-05: 1050 [IU]/h via INTRAVENOUS
  Filled 2022-07-04 (×2): qty 250

## 2022-07-04 MED ORDER — CARVEDILOL 6.25 MG PO TABS
6.2500 mg | ORAL_TABLET | Freq: Two times a day (BID) | ORAL | Status: DC
  Administered 2022-07-04 – 2022-07-05 (×2): 6.25 mg via ORAL
  Filled 2022-07-04 (×2): qty 1

## 2022-07-04 MED ORDER — HEPARIN BOLUS VIA INFUSION
4000.0000 [IU] | Freq: Once | INTRAVENOUS | Status: AC
  Administered 2022-07-04: 4000 [IU] via INTRAVENOUS

## 2022-07-04 MED ORDER — AMLODIPINE BESYLATE 10 MG PO TABS
10.0000 mg | ORAL_TABLET | Freq: Every day | ORAL | Status: DC
  Administered 2022-07-04 – 2022-07-08 (×4): 10 mg via ORAL
  Filled 2022-07-04 (×3): qty 1
  Filled 2022-07-04: qty 2
  Filled 2022-07-04: qty 1

## 2022-07-04 MED ORDER — LABETALOL HCL 5 MG/ML IV SOLN
10.0000 mg | Freq: Once | INTRAVENOUS | Status: AC
  Administered 2022-07-04: 10 mg via INTRAVENOUS
  Filled 2022-07-04: qty 4

## 2022-07-04 MED ORDER — ATORVASTATIN CALCIUM 40 MG PO TABS
40.0000 mg | ORAL_TABLET | Freq: Every day | ORAL | Status: DC
  Administered 2022-07-04 – 2022-07-07 (×4): 40 mg via ORAL
  Filled 2022-07-04 (×4): qty 1

## 2022-07-04 NOTE — ED Notes (Signed)
Report called to [facility] RN at [facility]

## 2022-07-04 NOTE — ED Notes (Signed)
ED Provider at bedside. 

## 2022-07-04 NOTE — ED Notes (Signed)
Patient transported to CT 

## 2022-07-04 NOTE — ED Notes (Signed)
Attempted to call report, nurse at [facility] unavailable. Left message for her to call me.

## 2022-07-04 NOTE — ED Triage Notes (Signed)
Pt here from home with c/o chest pain times [redacted] weeks along with cough , thinks it may be related to a new medication

## 2022-07-04 NOTE — ED Triage Notes (Signed)
Pt via pov from home with chest pain x 2 weeks; states he went to [facility] ED 2 weeks ago and didn't stay because there was a 10 hour wait. He went again today and was not willing to wait to be seen. Pt recently had glaucoma surgery and has had a headache since then. Pt alert & oriented. NAD noted.

## 2022-07-04 NOTE — ED Triage Notes (Signed)
Given 324mg  asa by ems

## 2022-07-04 NOTE — Progress Notes (Signed)
ANTICOAGULATION CONSULT NOTE - Initial Consult  Pharmacy Consult for Heparin Indication: chest pain/ACS  No Active Allergies  Patient Measurements: Height: _0  (177.8 cm) Weight: 73.5 kg (162 lb) IBW/kg (Calculated) : 73 Heparin Dosing Weight: 73.5 kg  Vital Signs: Temp: 98 F (36.7 C) (10/22 0949) Temp Source: Oral (10/22 0948) BP: 180/87 (10/22 1109) Pulse Rate: 82 (10/22 1109)  Labs: Recent Labs    07/04/22 0755  HGB 10.0*  HCT 31.3*  PLT 309  CREATININE 9.44*  TROPONINIHS 130*    Estimated Creatinine Clearance: 6.4 mL/min (A) (by C-G formula based on SCr of 9.44 mg/dL (H)).   Medical History: Past Medical History:  Diagnosis Date   Acute on chronic respiratory failure with hypoxia ([place]) 01/28/2015   Anemia    Arthritis    Chest pain    Chronic combined systolic and diastolic CHF (congestive heart failure) ([place])    a. Etiology of low EF not defined.   Chronic respiratory failure (HCC)    DVT (deep venous thrombosis) ([place])    a. 01/2015: RLE DVT. VQ scan intermediate probability. Underwent renal bx complicated by perinephric hematoma; anticoagulation stopped and IVC filter placed.   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 Received plasma exchange and steroids but didn't respond and ended up starting hemodialysis June 2016.    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 04/2015 in HD in setting of BP 50s.   Unintentional weight loss    Wears partial dentures     Medications:  (Not in a hospital admission)  Scheduled:  Infusions:  PRN:   Assessment: 81 yom with a history of anemia, HF, chronic resp failure, DVT s/p IVC, ESRD on HD MWF, HTN, NSTEMI, pulmonary fibrosis. Patient is presenting with chest pain. Heparin per pharmacy consult placed for chest pain/ACS.  Patient is not on anticoagulation prior to arrival.  Hgb 10 - unchanged from earlier this month; plt 309  Goal of Therapy:  Heparin level 0.3-0.7 units/ml Monitor platelets by anticoagulation protocol: Yes   Plan:  Give IV heparin 4000 units bolus x 1 Start heparin infusion at 900 units/hr Check anti-Xa level in 8 hours and daily while on heparin Continue to monitor H&H and platelets  Lorelei Pont, PharmD, BCPS 07/04/2022 11:57 AM ED Clinical Pharmacist -  [phone]

## 2022-07-04 NOTE — ED Notes (Signed)
Meal provided to patient

## 2022-07-04 NOTE — ED Provider Notes (Signed)
Travis [name]   CSN: [id number] Arrival date & time: 07/04/22  1505     History  Chief Complaint  Patient presents with   Chest Pain    Travis [name] is a 81 y.o. male.   Chest Pain Patient presents with chest pain headache.  Headache has been going since eye surgery a couple weeks ago.  Dull in the head.  Somewhat behind the right eye.  States he only follow-up once with the eye doctor because he felt like something was off.  States there is some blurriness in the eye. Also is a dialysis patient.  Monday Wednesday Friday.  Dialyzed on Friday with today being Sunday.  Has been having episodes where his blood pressure will go up during dialysis.  Also at times blood pressure go high.  Has been having episodes of chest pain also.  Anterior chest.  Somewhat exertional.  States he has more fatigue and cannot do what he used to.  States if he attempts to go out to get the trash cans he will noted chest pain which she did not have problems with before. Went to [facility] earlier today but there was too long to wait.  Blood work had been drawn and does show an elevated troponin.  Chest x-ray also shows potentially volume overload/CHF.    Past Medical History:  Diagnosis Date   Acute on chronic respiratory failure with hypoxia (HCC) 01/28/2015   Anemia    Arthritis    Chest pain    Chronic combined systolic and diastolic CHF (congestive heart failure) ([place])    a. Etiology of low EF not defined.   Chronic respiratory failure (HCC)    DVT (deep venous thrombosis) ([place])    a. 01/2015: RLE DVT. VQ scan intermediate probability. Underwent renal bx complicated by perinephric hematoma; anticoagulation stopped and IVC filter placed.   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 Received plasma exchange and steroids but didn't respond and ended up starting hemodialysis June  2016.    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 04/2015 in HD in setting of BP 50s.   Unintentional weight loss    Wears partial dentures     Home Medications Prior to Admission medications   Medication Sig Start Date End Date Taking? Authorizing Provider  Accu-Chek Softclix Lancets lancets Use to test blood glucose four times daily as directed. 05/26/21   Thurnell Lose, MD  amLODipine (NORVASC) 10 MG tablet Take 1 tablet (10 mg total) by mouth daily. Patient not taking: Reported on 12/19/2021 05/26/21   Thurnell Lose, MD  aspirin EC 81 MG EC  tablet Take 1 tablet (81 mg total) by mouth daily. 04/23/15   Reyne Dumas, MD  atorvastatin (LIPITOR) 40 MG tablet Take 1 tablet (40 mg total) by mouth daily at 6 PM. 04/23/15   Reyne Dumas, MD  Blood Glucose Monitoring Suppl (BLOOD GLUCOSE MONITOR SYSTEM) w/Device KIT Use up to four times daily as directed. 05/26/21   Thurnell Lose, MD  Brinzolamide-Brimonidine 1-0.2 % SUSP Place 1 drop into both eyes in the morning, at noon, and at bedtime. 11/03/21   [provider]  calcitRIOL (ROCALTROL) 0.25 MCG capsule Take 13 capsules (3.25 mcg total) by mouth every Monday, Wednesday, and Friday with hemodialysis. 12/25/21   Cherene Altes, MD   carvedilol (COREG) 6.25 MG tablet Take 1 tablet (6.25 mg total) by mouth 2 (two) times daily with a meal. 12/25/21   Cherene Altes, MD  cinacalcet (SENSIPAR) 30 MG tablet Take 3 tablets (90 mg total) by mouth every Monday, Wednesday, and Friday with hemodialysis. 12/25/21   Cherene Altes, MD  cyanocobalamin 500 MCG tablet Take 1 tablet (500 mcg total) by mouth daily. 12/26/14   Donne Hazel, MD  ferric gluconate 62.5 mg in sodium chloride 0.9 % 100 mL Inject 62.5 mg into the vein every Wednesday with hemodialysis. 12/30/21   Cherene Altes, MD  glucose blood test strip Use to test blood glucose four times daily as directed 05/26/21   Thurnell Lose, MD  latanoprost (XALATAN) 0.005 % ophthalmic solution Place 1 drop into both eyes at bedtime. 05/05/21   [provider]  losartan (COZAAR) 25 MG tablet Take 1 tablet (25 mg total) by mouth at bedtime. 12/25/21   Cherene Altes, MD  omeprazole (PRILOSEC) 40 MG capsule Take 40 mg by mouth daily. 07/31/15   [provider]  OXYGEN Inhale 2 L into the lungs daily.    [provider]  sevelamer carbonate (RENVELA) 800 MG tablet Take 2 tablets (1,600 mg total) by mouth 3 (three) times daily with meals. Patient not taking: Reported on 12/19/2021 05/17/15   Cherene Altes, MD  timolol (TIMOPTIC-XR) 0.5 % ophthalmic gel-forming Place 1 drop into both eyes daily. 11/03/21   [provider]      Allergies    Patient has no active allergies.    Review of Systems   Review of Systems  Cardiovascular:  Positive for chest pain.    Physical Exam Updated Vital Signs BP (!) 177/84 (BP Location: Left Arm)   Pulse 68   Temp 97.8 F (36.6 C) (Oral)   Resp (!) 21   Ht 5' 10"  (1.778 m)   Wt 73.5 kg   SpO2 96%   BMI 23.24 kg/m  Physical Exam Vitals and nursing [name] reviewed.  Cardiovascular:     Rate and Rhythm: Regular rhythm.  Pulmonary:     Breath sounds: No wheezing or rhonchi.  Chest:     Chest  wall: No tenderness.  Abdominal:     Tenderness: There is no abdominal tenderness.  Musculoskeletal:     Cervical back: Neck supple.  Skin:    General: Skin is warm.     Capillary Refill: Capillary refill takes less than 2 seconds.  Neurological:     Mental Status: He is alert.     ED Results / Procedures / Treatments   Labs (all labs ordered are listed, but only abnormal results are displayed) Labs Reviewed  TROPONIN I (HIGH SENSITIVITY) - Abnormal; Notable for the following components:  Result Value   Troponin I (High Sensitivity) 137 (*)    All other components within normal limits  PROTIME-INR  HEPARIN LEVEL (UNFRACTIONATED)  TROPONIN I (HIGH SENSITIVITY)    EKG EKG Interpretation  Date/Time:  Sunday July 04 2022 11:03:56 EDT Ventricular Rate:  83 PR Interval:  224 QRS Duration: 102 QT Interval:  422 QTC Calculation: 496 R Axis:   2 Text Interpretation: Sinus rhythm Prolonged PR interval LVH with secondary repolarization abnormality Anterior ST elevation, probably due to LVH Borderline prolonged QT interval Confirmed by Davonna Belling [phone]) on 07/04/2022 11:13:54 AM  Radiology CT Head Wo Contrast  Result Date: 07/04/2022 CLINICAL DATA:  Headaches EXAM: CT HEAD WITHOUT CONTRAST TECHNIQUE: Contiguous axial images were obtained from the base of the skull through the vertex without intravenous contrast. RADIATION DOSE REDUCTION: This exam was performed according to the departmental dose-optimization program which includes automated exposure control, adjustment of the mA and/or kV according to patient size and/or use of iterative reconstruction technique. COMPARISON:  Previous studies including the examination of 12/24/2021 FINDINGS: Brain: No acute intracranial findings are seen. There are no signs of bleeding within the cranium. Cortical sulci are prominent. There is decreased density in periventricular and subcortical white matter. Vascular: Unremarkable.  Skull: Unremarkable. Sinuses/Orbits: Visualized paranasal sinuses are unremarkable. There is a high density in the lateral and superior aspect of right optic globe suggesting previous intervention. Other: None. IMPRESSION: No acute intracranial findings are seen Atrophy.  Small-vessel disease. Electronically Signed   By: Elmer Picker M.D.   On: 07/04/2022 12:01   DG Chest 2 View  Result Date: 07/04/2022 CLINICAL DATA:  Chest pain EXAM: CHEST - 2 VIEW COMPARISON:  Prior chest x-ray 04/09/2022 FINDINGS: Progressive cardiomegaly. Atherosclerotic calcification present in the transverse aorta. Metal stent projects over the right innominate vein. Significantly increased diffuse interstitial airspace opacities bilaterally consistent with pulmonary edema. Small bilateral pleural effusions. Fluid is also entrapped within the major fissure on the lateral view. No pneumothorax. No acute osseous abnormality. IMPRESSION: 1. Progressive cardiomegaly, interstitial pulmonary edema and bilateral pleural effusions with fluid entrapped in the fissures. Findings may represent CHF or fluid overload in the setting of dialysis. Electronically Signed   By: Jacqulynn Cadet M.D.   On: 07/04/2022 08:37    Procedures Procedures    Medications Ordered in ED Medications  heparin ADULT infusion 100 units/mL (25000 units/251m) (900 Units/hr Intravenous New Bag/Given 07/04/22 1213)  aspirin chewable tablet 81 mg (81 mg Oral Given 07/04/22 1417)  heparin bolus via infusion 4,000 Units (4,000 Units Intravenous Bolus from Bag 07/04/22 1210)  labetalol (NORMODYNE) injection 10 mg (10 mg Intravenous Given 07/04/22 1357)    ED Course/ Medical Decision Making/ A&P                           Medical Decision Making Amount and/or Complexity of Data Reviewed Labs: ordered. Radiology: ordered.  Risk Prescription drug management. Decision regarding hospitalization.   Patient with chest pain.  Has had for the last couple  weeks.  Sounds exertional he cannot do the same activity as before.  Is a dialysis patient and had dialysis on Friday.  Has episodes of blood pressure going high.  With troponin elevated and chest pain with exertion and this is worrisome for ACS/unstable angina/non-STEMI.  Will start on heparin.  Will get head CT to evaluate the headache.  Will require admission to hospital.  Head CT reassuring.  Troponin is overall pretty much  stable from earlier with result of 137 now it was 130 earlier today.  Still somewhat hypertensive.  Will give medicines to control this.  Heparin has been started.  Will admit to internal medicine.  CRITICAL CARE Performed by: Davonna Belling Total critical care time: 30 minutes Critical care time was exclusive of separately billable procedures and treating other patients. Critical care was necessary to treat or prevent imminent or life-threatening deterioration. Critical care was time spent personally by me on the following activities: development of treatment plan with patient and/or surrogate as well as nursing, discussions with consultants, evaluation of patient's response to treatment, examination of patient, obtaining history from patient or surrogate, ordering and performing treatments and interventions, ordering and review of laboratory studies, ordering and review of radiographic studies, pulse oximetry and re-evaluation of patient's condition.          Final Clinical Impression(s) / ED Diagnoses Final diagnoses:  ACS (acute coronary syndrome) ([place])  End stage renal disease on dialysis [facility])    Rx / DC Orders ED Discharge Orders     None         Davonna Belling, MD 07/04/22 1516

## 2022-07-04 NOTE — Progress Notes (Signed)
ANTICOAGULATION CONSULT NOTE   Pharmacy Consult for Heparin Indication: chest pain/ACS  No Active Allergies  Patient Measurements: Height: 5\' 10"  (177.8 cm) Weight: 73.5 kg (162 lb) IBW/kg (Calculated) : 73 Heparin Dosing Weight: 73.5 kg  Vital Signs: Temp: 98.1 F (36.7 C) (10/22 2000) Temp Source: Oral (10/22 2000) BP: 125/61 (10/22 2100) Pulse Rate: 79 (10/22 1923)  Labs: Recent Labs    07/04/22 0755 07/04/22 1136 07/04/22 1541 07/04/22 2024  HGB 10.0*  --   --   --   HCT 31.3*  --   --   --   PLT 309  --   --   --   LABPROT  --  13.1  --   --   INR  --  1.0  --   --   HEPARINUNFRC  --   --   --  0.40  CREATININE 9.44*  --   --   --   TROPONINIHS 130* 137* 120*  --      Estimated Creatinine Clearance: 6.4 mL/min (A) (by C-G formula based on SCr of 9.44 mg/dL (H)).   Medical History: Past Medical History:  Diagnosis Date   Acute on chronic respiratory failure with hypoxia ([place]) 01/28/2015   Anemia    Arthritis    Chest pain    Chronic combined systolic and diastolic CHF (congestive heart failure) ([place])    a. Etiology of low EF not defined.   Chronic respiratory failure (HCC)    DVT (deep venous thrombosis) ([place])    a. 01/2015: RLE DVT. VQ scan intermediate probability. Underwent renal bx complicated by perinephric hematoma; anticoagulation stopped and IVC filter placed.   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 Received plasma exchange and steroids but didn't respond and ended up starting hemodialysis June 2016.    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 04/2015 in HD in setting of BP 50s.   Unintentional weight loss    Wears partial dentures     Medications:  (Not in a hospital admission)  Scheduled:  Infusions:  PRN:   Assessment: 15 yom with a history of anemia, HF, chronic resp failure, DVT s/p IVC, ESRD on HD MWF, HTN, NSTEMI, pulmonary fibrosis. Patient is presenting with chest pain. Heparin per pharmacy consult placed for chest pain/ACS.  Patient is not on anticoagulation prior to arrival.  Hgb 10 - unchanged from earlier this month; plt 309  PM f/u > heparin level 0.4- at goal.  Goal of Therapy:  Heparin level 0.3-0.7 units/ml Monitor platelets by anticoagulation protocol: Yes   Plan:   Continue heparin infusion at 900 units/hr AM heparin level and  CBC. Will continue to monitor for s/s of bleeding.  [place] Crane, Roylene Reason, BCCP Clinical Pharmacist  07/04/2022 9:36 PM   [facility] pharmacy phone numbers are listed on [email or web address]

## 2022-07-04 NOTE — ED Notes (Signed)
Pt said they were leaving, pt left

## 2022-07-05 ENCOUNTER — Inpatient Hospital Stay (HOSPITAL_COMMUNITY): Payer: Medicare Other

## 2022-07-05 ENCOUNTER — Encounter (HOSPITAL_COMMUNITY): Payer: Self-pay | Admitting: Internal Medicine

## 2022-07-05 DIAGNOSIS — D649 Anemia, unspecified: Secondary | ICD-10-CM

## 2022-07-05 DIAGNOSIS — R079 Chest pain, unspecified: Secondary | ICD-10-CM

## 2022-07-05 DIAGNOSIS — Z992 Dependence on renal dialysis: Secondary | ICD-10-CM

## 2022-07-05 DIAGNOSIS — R7989 Other specified abnormal findings of blood chemistry: Secondary | ICD-10-CM

## 2022-07-05 DIAGNOSIS — R9431 Abnormal electrocardiogram [ECG] [EKG]: Secondary | ICD-10-CM

## 2022-07-05 DIAGNOSIS — I5033 Acute on chronic diastolic (congestive) heart failure: Secondary | ICD-10-CM

## 2022-07-05 DIAGNOSIS — N186 End stage renal disease: Secondary | ICD-10-CM

## 2022-07-05 DIAGNOSIS — I249 Acute ischemic heart disease, unspecified: Secondary | ICD-10-CM

## 2022-07-05 DIAGNOSIS — I10 Essential (primary) hypertension: Secondary | ICD-10-CM

## 2022-07-05 DIAGNOSIS — K219 Gastro-esophageal reflux disease without esophagitis: Secondary | ICD-10-CM

## 2022-07-05 LAB — GLUCOSE, CAPILLARY: Glucose-Capillary: 13 mg/dL — CL (ref 70–99)

## 2022-07-05 LAB — COMPREHENSIVE METABOLIC PANEL
ALT: 7 U/L (ref 0–44)
AST: 14 U/L — ABNORMAL LOW (ref 15–41)
Albumin: 3 g/dL — ABNORMAL LOW (ref 3.5–5.0)
Alkaline Phosphatase: 69 U/L (ref 38–126)
Anion gap: 15 (ref 5–15)
BUN: 38 mg/dL — ABNORMAL HIGH (ref 8–23)
CO2: 27 mmol/L (ref 22–32)
Calcium: 8.7 mg/dL — ABNORMAL LOW (ref 8.9–10.3)
Chloride: 94 mmol/L — ABNORMAL LOW (ref 98–111)
Creatinine, Ser: 10.91 mg/dL — ABNORMAL HIGH (ref 0.61–1.24)
GFR, Estimated: 4 mL/min — ABNORMAL LOW (ref >60)
Glucose, Bld: 96 mg/dL (ref 70–99)
Potassium: 4.6 mmol/L (ref 3.5–5.1)
Sodium: 136 mmol/L (ref 135–145)
Total Bilirubin: 0.7 mg/dL (ref 0.3–1.2)
Total Protein: 6.9 g/dL (ref 6.5–8.1)

## 2022-07-05 LAB — IRON AND TIBC
Iron: 45 ug/dL (ref 45–182)
Saturation Ratios: 23 % (ref 17.9–39.5)
TIBC: 196 ug/dL — ABNORMAL LOW (ref 250–450)
UIBC: 151 ug/dL

## 2022-07-05 LAB — HEPARIN LEVEL (UNFRACTIONATED)
Heparin Unfractionated: <0.1 IU/mL — ABNORMAL LOW (ref 0.30–0.70)
Heparin Unfractionated: 0.28 IU/mL — ABNORMAL LOW (ref 0.30–0.70)
Heparin Unfractionated: 0.34 IU/mL (ref 0.30–0.70)

## 2022-07-05 LAB — RETICULOCYTES
Immature Retic Fract: 28 % — ABNORMAL HIGH (ref 2.3–15.9)
RBC.: 2.95 MIL/uL — ABNORMAL LOW (ref 4.22–5.81)
Retic Count, Absolute: 52.5 10*3/uL (ref 19.0–186.0)
Retic Ct Pct: 1.8 % (ref 0.4–3.1)

## 2022-07-05 LAB — CBC
HCT: 27.6 % — ABNORMAL LOW (ref 39.0–52.0)
Hemoglobin: 8.9 g/dL — ABNORMAL LOW (ref 13.0–17.0)
MCH: 30.3 pg (ref 26.0–34.0)
MCHC: 32.2 g/dL (ref 30.0–36.0)
MCV: 93.9 fL (ref 80.0–100.0)
Platelets: 260 10*3/uL (ref 150–400)
RBC: 2.94 MIL/uL — ABNORMAL LOW (ref 4.22–5.81)
RDW: 14.9 % (ref 11.5–15.5)
WBC: 9.9 10*3/uL (ref 4.0–10.5)
nRBC: 0 % (ref 0.0–0.2)

## 2022-07-05 LAB — BRAIN NATRIURETIC PEPTIDE: B Natriuretic Peptide: 2472.8 pg/mL — ABNORMAL HIGH (ref 0.0–100.0)

## 2022-07-05 LAB — PROTIME-INR
INR: 1.1 (ref 0.8–1.2)
Prothrombin Time: 13.9 seconds (ref 11.4–15.2)

## 2022-07-05 LAB — TYPE AND SCREEN
ABO/RH(D): A POS
Antibody Screen: NEGATIVE

## 2022-07-05 LAB — HEPATITIS B SURFACE ANTIGEN: Hepatitis B Surface Ag: NONREACTIVE

## 2022-07-05 LAB — FERRITIN: Ferritin: 1001 ng/mL — ABNORMAL HIGH (ref 24–336)

## 2022-07-05 LAB — LIPASE, BLOOD: Lipase: 36 U/L (ref 11–51)

## 2022-07-05 LAB — TROPONIN I (HIGH SENSITIVITY): Troponin I (High Sensitivity): 107 ng/L (ref <18)

## 2022-07-05 LAB — MAGNESIUM: Magnesium: 2.1 mg/dL (ref 1.7–2.4)

## 2022-07-05 LAB — PHOSPHORUS: Phosphorus: 6.5 mg/dL — ABNORMAL HIGH (ref 2.5–4.6)

## 2022-07-05 MED ORDER — NITROGLYCERIN 0.4 MG SL SUBL: 0.4000 mg | SUBLINGUAL_TABLET | SUBLINGUAL | Status: DC | PRN

## 2022-07-05 MED ORDER — CALCITRIOL 0.5 MCG PO CAPS
2.7500 ug | ORAL_CAPSULE | ORAL | Status: DC
  Administered 2022-07-05: 2.75 ug via ORAL
  Filled 2022-07-05: qty 1

## 2022-07-05 MED ORDER — PANTOPRAZOLE SODIUM 40 MG IV SOLR
40.0000 mg | INTRAVENOUS | Status: DC
  Administered 2022-07-05: 40 mg via INTRAVENOUS
  Filled 2022-07-05 (×2): qty 10

## 2022-07-05 MED ORDER — LIDOCAINE HCL (PF) 1 % IJ SOLN: 5.0000 mL | INTRAMUSCULAR | Status: DC | PRN

## 2022-07-05 MED ORDER — CINACALCET HCL 30 MG PO TABS
90.0000 mg | ORAL_TABLET | ORAL | Status: DC
  Administered 2022-07-05: 90 mg via ORAL
  Filled 2022-07-05 (×3): qty 3

## 2022-07-05 MED ORDER — GLUCOSE 40 % PO GEL
ORAL | Status: AC
  Filled 2022-07-05: qty 1

## 2022-07-05 MED ORDER — MELATONIN 5 MG PO TABS
10.0000 mg | ORAL_TABLET | Freq: Every evening | ORAL | Status: DC | PRN
  Administered 2022-07-05 – 2022-07-06 (×2): 10 mg via ORAL
  Filled 2022-07-05 (×2): qty 2

## 2022-07-05 MED ORDER — ACETAMINOPHEN 325 MG PO TABS
650.0000 mg | ORAL_TABLET | Freq: Four times a day (QID) | ORAL | Status: DC | PRN
  Administered 2022-07-05: 650 mg via ORAL
  Filled 2022-07-05: qty 2

## 2022-07-05 MED ORDER — CHLORHEXIDINE GLUCONATE CLOTH 2 % EX PADS: 6.0000 | MEDICATED_PAD | Freq: Every day | CUTANEOUS | Status: DC

## 2022-07-05 MED ORDER — PENTAFLUOROPROP-TETRAFLUOROETH EX AERO: 1.0000 | INHALATION_SPRAY | CUTANEOUS | Status: DC | PRN

## 2022-07-05 MED ORDER — SODIUM CHLORIDE 0.9 % IV SOLN: INTRAVENOUS | Status: DC

## 2022-07-05 MED ORDER — ONDANSETRON HCL 4 MG/2ML IJ SOLN: 4.0000 mg | Freq: Four times a day (QID) | INTRAMUSCULAR | Status: DC | PRN

## 2022-07-05 MED ORDER — CARVEDILOL 12.5 MG PO TABS
12.5000 mg | ORAL_TABLET | Freq: Two times a day (BID) | ORAL | Status: DC
  Administered 2022-07-05 – 2022-07-08 (×5): 12.5 mg via ORAL
  Filled 2022-07-05 (×6): qty 1

## 2022-07-05 MED ORDER — LIDOCAINE-PRILOCAINE 2.5-2.5 % EX CREA: 1.0000 | TOPICAL_CREAM | CUTANEOUS | Status: DC | PRN

## 2022-07-05 MED ORDER — SEVELAMER CARBONATE 800 MG PO TABS
3200.0000 mg | ORAL_TABLET | Freq: Three times a day (TID) | ORAL | Status: DC
  Administered 2022-07-05 – 2022-07-08 (×5): 3200 mg via ORAL
  Filled 2022-07-05 (×6): qty 4

## 2022-07-05 MED ORDER — ACETAMINOPHEN 650 MG RE SUPP: 650.0000 mg | Freq: Four times a day (QID) | RECTAL | Status: DC | PRN

## 2022-07-05 MED ORDER — HEPARIN BOLUS VIA INFUSION
2000.0000 [IU] | Freq: Once | INTRAVENOUS | Status: AC
  Administered 2022-07-05: 2000 [IU] via INTRAVENOUS
  Filled 2022-07-05: qty 2000

## 2022-07-05 MED ORDER — ALBUMIN HUMAN 25 % IV SOLN: 12.5000 g | INTRAVENOUS | Status: DC | PRN

## 2022-07-05 MED ORDER — DARBEPOETIN ALFA 40 MCG/0.4ML IJ SOSY
40.0000 ug | PREFILLED_SYRINGE | INTRAMUSCULAR | Status: DC
  Administered 2022-07-05: 40 ug via INTRAVENOUS
  Filled 2022-07-05 (×2): qty 0.4

## 2022-07-05 NOTE — TOC Initial Note (Signed)
Transition of Care [facility]) - Initial/Assessment Note    Patient Details  Name: Travis [name] MRN: [id number] Date of Birth: Nov 14, 1940  Transition of Care [facility]) CM/SW Contact:    Bethena Roys, RN Phone Number: 07/05/2022, 12:04 PM  Clinical Narrative:  Risk for readmission assessment completed. PTA patient was from home with spouse and has support of daughter. Case Manager spoke with spouse and daughter Travis [name] regarding disposition needs. Per family, patient has DME Rolling walker in the home. PCP Is working towards getting the patient a motorized scooter. Case Manager called [place] Coordinator to make them aware of hospitalization. Patient is a member of the [facility]; PCP is Dr. Netta Neat and [place] is [place] [phone] ext [phone]. Spouse states patient gets all medications from the [facility] and that [facility] Case Manager follows the patient in the home. Case Manager will continue to follow for additional toc needs.                Expected Discharge Plan: [place] Barriers to Discharge: Continued Medical Work up   Patient Goals and CMS Choice Patient states their goals for this hospitalization and ongoing recovery are:: to return home with family support.   Choice offered to / list presented to : NA  Expected Discharge Plan and Services Expected Discharge Plan: [place] In-house Referral: NA Discharge Planning Services: CM Consult Post Acute Care Choice: NA Living arrangements for the past 2 months: Single Family Home                 DME Arranged: N/A DME Agency: NA    Prior Living Arrangements/Services Living arrangements for the past 2 months: Single Family Home Lives with:: Spouse (has support fo daughter Travis [name].) Patient language and need for interpreter reviewed:: Yes Do you feel safe going back to the place where you live?: Yes      Need for Family Participation in Patient Care: Yes  (Comment) Care giver support system in place?: Yes (comment) Current home services: DME (Patient has DME rolling walker.) Criminal Activity/Legal Involvement Pertinent to Current Situation/Hospitalization: No - Comment as needed  Activities of Daily Living Home Assistive Devices/Equipment: Walker (specify type), Raised toilet seat with rails ADL Screening (condition at time of admission) Patient's cognitive ability adequate to safely complete daily activities?: Yes Is the patient deaf or have difficulty hearing?: No Does the patient have difficulty seeing, even when wearing glasses/contacts?: No Does the patient have difficulty concentrating, remembering, or making decisions?: No Patient able to express need for assistance with ADLs?: Yes Does the patient have difficulty dressing or bathing?: No Independently performs ADLs?: Yes (appropriate for developmental age) Does the patient have difficulty walking or climbing stairs?: Yes Weakness of Legs: Both Weakness of Arms/Hands: None  Permission Sought/Granted Permission sought to share information with : Case Manager, Family Supports    Emotional Assessment Appearance:: Appears stated age Attitude/Demeanor/Rapport: Unable to Assess Affect (typically observed): Unable to Assess   Alcohol / Substance Use: Not Applicable Psych Involvement: No (comment)  Admission diagnosis:  ACS (acute coronary syndrome) (HCC) [I24.9] End stage renal disease on dialysis ([place]) [N18.6, Z99.2] NSTEMI (non-ST elevated myocardial infarction) [facility]) [I21.4] Patient Active Problem List   Diagnosis Date Noted   Elevated troponin 07/05/2022   Prolonged QT interval 07/05/2022   GERD (gastroesophageal reflux disease) 07/05/2022   NSTEMI (non-ST elevated myocardial infarction) ([place]) 07/04/2022   Delirium 12/22/2021   Hypertensive encephalopathy 12/20/2021   End stage renal  disease on dialysis [facility])    Hypertensive emergency 12/18/2021   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   Chronic systolic CHF (congestive heart failure), NYHA class 2 ([place]) 11/05/2015   Chest pain    Thrombocytopenia ([place]) 08/11/2015   FTT (failure to thrive) in adult 03/55/9741   Acute systolic CHF (congestive heart failure) ([place]) 04/25/2015   HTN (hypertension) 04/19/2015   Protein-calorie malnutrition, severe ([place]) 04/19/2015   Acute on chronic diastolic CHF (congestive heart failure) ([place]) 04/18/2015   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   ESRD on dialysis [facility])    Palliative care encounter    Acute DVT of right tibial vein ([place]) 01/29/2015   Physical deconditioning 01/28/2015   Sjogren's disease ([place]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 [place] [address] Phone: [phone] Fax: [phone]   Readmission Risk Interventions    07/05/2022   12:00 PM 12/22/2021    5:33 PM 05/26/2021   12:03 PM  Readmission Risk Prevention Plan  Transportation Screening Complete Complete Complete  PCP or Specialist Appt within 3-5 Days  Complete Complete  HRI or Home Care Consult Complete Complete Complete  Social Work Consult for [place] Planning/Counseling Complete Complete Complete  Palliative Care Screening Not Applicable Not Applicable Not Applicable  Medication Review (RN Care Manager) Referral to Pharmacy Complete Complete

## 2022-07-05 NOTE — Progress Notes (Signed)
   07/05/22 1130  Clinical Encounter Type  Visited With Health care provider;Patient not available  Visit Type Initial (Advance Directive)  Referral From Physician;Nurse (Justin B. Howerter, DO; Burnard Bunting, RN)  Consult/Referral To Chaplain Albertina Parr Morene Crocker)  Recommendations Attempted Advance Directive Education   Attempted to provide Forensic scientist Education. Patient gone from room. [address] [place], [place]vin Poot., [phone]

## 2022-07-05 NOTE — Progress Notes (Signed)
Heart Failure Navigator Progress Note  Assessed for Heart & Vascular TOC clinic readiness.  Patient does not meet criteria due to ESRD, patient on dialysis.    Earnestine Leys, BSN, Clinical cytogeneticist Only

## 2022-07-05 NOTE — Progress Notes (Signed)
ANTICOAGULATION CONSULT NOTE   Pharmacy Consult for Heparin Indication: chest pain/ACS  No Active Allergies  Patient Measurements: Height: _0  (177.8 cm) Weight: 71.1 kg (156 lb 12.8 oz) IBW/kg (Calculated) : 73 Heparin Dosing Weight: 73.5 kg  Vital Signs: Temp: 98.2 F (36.8 C) (10/22 2256) Temp Source: Oral (10/22 2256) BP: 137/68 (10/22 2256) Pulse Rate: 73 (10/22 2256)  Labs: Recent Labs    07/04/22 0755 07/04/22 1136 07/04/22 1541 07/04/22 2024 07/05/22 0215  HGB 10.0*  --   --   --  8.9*  HCT 31.3*  --   --   --  27.6*  PLT 309  --   --   --  260  LABPROT  --  13.1  --   --   --   INR  --  1.0  --   --   --   HEPARINUNFRC  --   --   --  0.40 <0.10*  CREATININE 9.44*  --   --   --   --   TROPONINIHS 130* 137* 120*  --   --      Estimated Creatinine Clearance: 6.3 mL/min (A) (by C-G formula based on SCr of 9.44 mg/dL (H)).   Medical History: Past Medical History:  Diagnosis Date   Acute on chronic respiratory failure with hypoxia ([place]) 01/28/2015   Anemia    Arthritis    Chest pain    Chronic combined systolic and diastolic CHF (congestive heart failure) ([place])    a. Etiology of low EF not defined.   Chronic respiratory failure ([facility])    DVT (deep venous thrombosis) ([place])    a. 01/2015: RLE DVT. VQ scan intermediate probability. Underwent renal bx complicated by perinephric hematoma; anticoagulation stopped and IVC filter placed.   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 Received plasma exchange and steroids but didn't respond and ended up starting hemodialysis June 2016.    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 04/2015 in HD in setting of BP 50s.   Unintentional weight loss    Wears partial dentures     Medications:  Medications Prior to Admission  Medication Sig Dispense Refill Last Dose   Accu-Chek Softclix Lancets lancets Use to test blood glucose four times daily as directed. 100 each 1    amLODipine (NORVASC) 10 MG tablet Take 1 tablet (10 mg total) by mouth daily. (Patient not taking: Reported on 12/19/2021) 30 tablet 0    aspirin EC 81 MG EC tablet Take 1 tablet (81 mg total) by mouth daily. 30 tablet 0    atorvastatin (LIPITOR) 40 MG tablet Take 1 tablet (40  mg total) by mouth daily at 6 PM. 30 tablet 0    Blood Glucose Monitoring Suppl (BLOOD GLUCOSE MONITOR SYSTEM) w/Device KIT Use up to four times daily as directed. 1 kit 1    Brinzolamide-Brimonidine 1-0.2 % SUSP Place 1 drop into both eyes in the morning, at noon, and at bedtime.      calcitRIOL (ROCALTROL) 0.25 MCG capsule Take 13 capsules (3.25 mcg total) by mouth every Monday, Wednesday, and Friday with hemodialysis.      carvedilol (COREG) 6.25 MG tablet Take 1 tablet (6.25 mg total) by mouth 2 (two) times daily with a meal. 60 tablet 1    cinacalcet (SENSIPAR) 30 MG tablet Take 3 tablets (90 mg total) by mouth every Monday, Wednesday, and Friday with hemodialysis. 60 tablet     cyanocobalamin 500 MCG tablet Take 1 tablet (500 mcg total) by mouth daily. 30 tablet 0    ferric gluconate 62.5 mg in sodium chloride 0.9 % 100 mL Inject 62.5 mg into the vein every Wednesday with hemodialysis.       glucose blood test strip Use to test blood glucose four times daily as directed 100 each 1    latanoprost (XALATAN) 0.005 % ophthalmic solution Place 1 drop into both eyes at bedtime.      losartan (COZAAR) 25 MG tablet Take 1 tablet (25 mg total) by mouth at bedtime. 30 tablet 1    omeprazole (PRILOSEC) 40 MG capsule Take 40 mg by mouth daily.  11    OXYGEN Inhale 2 L into the lungs daily.      sevelamer carbonate (RENVELA) 800 MG tablet Take 2 tablets (1,600 mg total) by mouth 3 (three) times daily with meals. (Patient not taking: Reported on 12/19/2021) 90 tablet 0    timolol (TIMOPTIC-XR) 0.5 % ophthalmic gel-forming Place 1 drop into both eyes daily.       Scheduled:  Infusions:  PRN:   Assessment: 4 yom with a history of anemia, HF, chronic resp failure, DVT s/p IVC, ESRD on HD MWF, HTN, NSTEMI, pulmonary fibrosis. Patient is presenting with chest pain. Heparin per pharmacy consult placed for chest pain/ACS.  Patient is not on anticoagulation prior to arrival.  Hgb 10 - unchanged from earlier this month; plt 309  PM f/u > heparin level 0.4- at goal.  10/23 AM update:  Heparin level low No issues per RN  Goal of Therapy:  Heparin level 0.3-0.7 units/ml Monitor platelets by anticoagulation protocol: Yes   Plan:  Heparin 2000 units bolus Inc heparin to 1050 units/hr 1100 heparin level  Narda Bonds, PharmD, BCPS Clinical Pharmacist Phone: [phone]

## 2022-07-05 NOTE — Progress Notes (Signed)
Briefly, patient is an 81 year old man with ESRD, HFpEF, pulmonary HTN who was admitted earlier this morning with complaints of chest pain as well as decompensation of heart failure.  Work-up was notable for some EKG changes including new T wave inversions in V5 and V6.  Chest x-ray showed interval development of pulmonary edema.  Was seen in dialysis, feels much improved with less shortness of breath.  Patient states he is waiting to see cardiology but denies having any further chest pain  Agree with plan as already initiated. Tolerated dialysis well Was seen by cardiology who are planning left and right heart cath tomorrow with possible PCI, patient is consented, remains on heparin drip, BP is well controlled and patient is chest pain-free.  EKG today shows decreased QTc to 507 but it is not normalized.  Magnesium is 2.1, calcium is 4.6. Continue present management.

## 2022-07-05 NOTE — Consult Note (Signed)
Cardiology Consultation   Patient ID: Travis [name] MRN: [id number]; DOB: 1941-05-14  Admit date: 07/04/2022 Date of Consult: 07/05/2022  PCP:  [name], [place] [facility] Cardiologist:  Freada Bergeron, MD  Cardiology APP:  Liliane Shi, PA-C       Patient Profile:   Travis [name] is a 81 y.o. male with a hx of ESRD (on M,W,F hemodialysis schedule), chronic diastolic heart failure, pulmonary hypertension, GERD, DVT of RLE (s/p IVC filter, not on [place] 2/2 perinephric hematoma), anemia of chronic disease, COPD who is being seen 07/05/2022 for the evaluation of chest pain at the request of Dr. Velia Meyer.  History of Present Illness:   Travis [name] presented to [facility] on 07/04/22 with symptoms of chest pain. He reportedly has been experiencing intermittent left side chest pain for the last month. These symptoms seemed to worsen shortly after cataract surgery last week. Patient describes sharp pain that radiates into his shoulder. Symptom onset occurs with exertion and improves/resolves with rest. Patient describes an episode of pain that occurred while taking out his trash, subsequently resolved with rest. Prior to the last 4 weeks, patient would not have experienced pain with this type of exertion. In the days just prior to ED presentation, patient also noted new onset shortness of breath, more significant with supine positioning. Denies missed dialysis sessions. Per patient, he did not want to go to the ED yesterday but his spouse made him go due to increased frequency of chest discomfort.  Patient palpitation lightheadedness/dizziness.  Also denies weight gain, states that weight has been down 2/2 dialysis.   Cardiac history:  Patient last seen by cardiology on 03/10/2021.  Per the note from that office visit, patient had been scheduled for an outpatient echocardiogram and Myoview earlier that year 2/2 chest pain and elevated troponin that was not  felt to be consistent with ACS.  His echocardiogram was completed on 11/11/2020 and demonstrated normal EF and moderate diastolic dysfunction.  There was mild to moderate tricuspid regurg, mild AI, mild AS.  It appears the patient did not show for his Myoview scan.  Per patient, he had had a Myoview at [facility], appears that per records this was in 2019 and was a low risk study.  In the office visit on 03/10/2021, patient was without any further chest pain or shortness of breath.  Given that the patient has been chest pain-free for several months following pain earlier in 2022, decision made to defer further ischemic evaluation.  Past Medical History:  Diagnosis Date   Acute on chronic respiratory failure with hypoxia (HCC) 01/28/2015   Anemia    Arthritis    Chest pain    Chronic combined systolic and diastolic CHF (congestive heart failure) ([place])    a. Etiology of low EF not defined.   Chronic respiratory failure (HCC)    DVT (deep venous thrombosis) ([place])    a. 01/2015: RLE DVT. VQ scan intermediate probability. Underwent renal bx complicated by perinephric hematoma; anticoagulation stopped and IVC filter placed.   ESRD on hemodialysis ([place]) 02/2015   a. 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 Received plasma exchange and steroids but didn't respond and ended up starting hemodialysis June 2016.    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 04/2015 in HD in setting of BP 50s.   Unintentional weight loss    Wears partial dentures     Past Surgical History:  Procedure Laterality Date   AV FISTULA PLACEMENT Right 02/17/2015   Procedure: INSERTION OF RIGHT ARM  ARTERIOVENOUS (AV) GORE-TEX GRAFT ;  Surgeon: Elam Dutch, MD;  Location: [facility];  Service: Vascular;  Laterality: Right;   FLEXIBLE SIGMOIDOSCOPY N/A 08/04/2015   Procedure: FLEXIBLE SIGMOIDOSCOPY;  Surgeon: Wilford Corner, MD;  Location: [facility] ENDOSCOPY;  Service: Endoscopy;  Laterality: N/A;   HEMORROIDECTOMY  1999   INSERTION OF DIALYSIS CATHETER N/A 02/17/2015   Procedure: INSERTION OF DIALYSIS CATHETER RIGHT INTERNAL JUGULAR VEIN;  Surgeon: Elam Dutch, MD;  Location: [facility];  Service: Vascular;  Laterality: N/A;   MULTIPLE TOOTH EXTRACTIONS     PERIPHERAL VASCULAR BALLOON ANGIOPLASTY Right 01/23/2019   Procedure: PERIPHERAL VASCULAR BALLOON ANGIOPLASTY;  Surgeon: Elam Dutch, MD;  Location: [place] [facility];  Service: Cardiovascular;  Laterality: Right;   REVISION OF ARTERIOVENOUS GORETEX GRAFT Right 03/29/2017   Procedure: REVISION OF ARTERIOVENOUS GORETEX GRAFT;  Surgeon: Conrad [place], MD;  Location: [facility] OR;  Service: Vascular;  Laterality: Right;   Surgical procedure to remove a mole as a child Right Eye area   At around 4 years old     Home Medications:  Prior to Admission medications   Medication Sig Start Date End Date Taking? Authorizing Provider  Accu-Chek Softclix Lancets lancets Use to test blood glucose four times daily as directed. 05/26/21   Thurnell Lose, MD  amLODipine (NORVASC) 10 MG tablet Take 1 tablet (10 mg total) by mouth daily. Patient not taking: Reported on 12/19/2021 05/26/21   Thurnell Lose, MD  aspirin EC 81 MG EC tablet  Take 1 tablet (81 mg total) by mouth daily. 04/23/15   Reyne Dumas, MD  atorvastatin (LIPITOR) 40 MG tablet Take 1 tablet (40 mg total) by mouth daily at 6 PM. 04/23/15   Reyne Dumas, MD  Blood Glucose Monitoring Suppl (BLOOD GLUCOSE MONITOR SYSTEM) w/Device KIT Use up to four times daily as directed. 05/26/21   Thurnell Lose, MD  Brinzolamide-Brimonidine 1-0.2 % SUSP Place 1 drop into both eyes in the morning, at noon, and at bedtime. 11/03/21   [provider]  calcitRIOL (ROCALTROL) 0.25 MCG capsule Take 13 capsules (3.25 mcg total) by mouth every Monday, Wednesday, and Friday with hemodialysis. 12/25/21   Cherene Altes, MD  carvedilol (COREG) 6.25 MG tablet Take 1 tablet (6.25 mg total) by mouth 2 (two) times daily with a meal. 12/25/21   Cherene Altes, MD  cinacalcet (SENSIPAR) 30 MG tablet Take 3 tablets (90 mg total) by mouth every Monday, Wednesday, and Friday with hemodialysis. 12/25/21   Cherene Altes, MD  cyanocobalamin 500 MCG tablet Take 1 tablet (500 mcg total)  by mouth daily. 12/26/14   Donne Hazel, MD  ferric gluconate 62.5 mg in sodium chloride 0.9 % 100 mL Inject 62.5 mg into the vein every Wednesday with hemodialysis. 12/30/21   Cherene Altes, MD  glucose blood test strip Use to test blood glucose four times daily as directed 05/26/21   Thurnell Lose, MD  latanoprost (XALATAN) 0.005 % ophthalmic solution Place 1 drop into both eyes at bedtime. 05/05/21   [provider]  losartan (COZAAR) 25 MG tablet Take 1 tablet (25 mg total) by mouth at bedtime. 12/25/21   Cherene Altes, MD  omeprazole (PRILOSEC) 40 MG capsule Take 40 mg by mouth daily. 07/31/15   [provider]  OXYGEN Inhale 2 L into the lungs daily.    [provider]  sevelamer carbonate (RENVELA) 800 MG tablet Take 2 tablets (1,600 mg total) by mouth 3 (three) times daily with meals. Patient not taking: Reported on 12/19/2021 05/17/15   Cherene Altes, MD   timolol (TIMOPTIC-XR) 0.5 % ophthalmic gel-forming Place 1 drop into both eyes daily. 11/03/21   [provider]    Inpatient Medications: Scheduled Meds:  amLODipine  10 mg Oral Daily   aspirin  81 mg Oral Daily   atorvastatin  40 mg Oral q1800   calcitRIOL  2.75 mcg Oral Q M,W,F-HD   carvedilol  12.5 mg Oral BID WC   cinacalcet  90 mg Oral Q M,W,F-HD   darbepoetin (ARANESP) injection - DIALYSIS  40 mcg Intravenous Q Mon-HD   losartan  25 mg Oral QHS   pantoprazole (PROTONIX) IV  40 mg Intravenous Q24H   sevelamer carbonate  3,200 mg Oral TID WC   Continuous Infusions:  albumin human     heparin 1,150 Units/hr (07/05/22 1111)   PRN Meds: acetaminophen **OR** acetaminophen, albumin human, lidocaine (PF), lidocaine-prilocaine, nitroGLYCERIN, ondansetron (ZOFRAN) IV, pentafluoroprop-tetrafluoroeth  Allergies:   No Active Allergies  Social History:   Social History   Socioeconomic History   Marital status: Married    Spouse name: Not on file   Number of children: Not on file   Years of education: Not on file   Highest education level: Not on file  Occupational History   Not on file  Tobacco Use   Smoking status: Never   Smokeless tobacco: Never  Vaping Use   Vaping Use: Never used  Substance and Sexual Activity   Alcohol use: No    Alcohol/week: 0.0 standard drinks of alcohol   Drug use: No   Sexual activity: Not Currently  Other Topics Concern   Not on file  Social History Narrative   Not on file   Social Determinants of Health   Financial Resource Strain: Not on file  Food Insecurity: No Food Insecurity (07/05/2022)   Hunger Vital Sign    Worried About Running Out of Food in the Last Year: Never true    Ran Out of Food in the Last Year: Never true  Transportation Needs: No Transportation Needs (07/05/2022)   PRAPARE - Hydrologist (Medical): No    Lack of Transportation (Non-Medical): No  Physical Activity: Not on file   Stress: Not on file  Social Connections: Not on file  Intimate Partner Violence: Not At Risk (07/05/2022)   Humiliation, Afraid, Rape, and Kick questionnaire    Fear of Current or Ex-Partner: No    Emotionally Abused: No    Physically Abused: No    Sexually Abused: No  Family History:    Family History  Problem Relation Age of Onset   Hypertension Mother    Healthy Father    Hypertension Sister    Hypertension Brother      ROS:  Please see the history of present illness.   All other ROS reviewed and negative.     Physical Exam/Data:   Vitals:   07/05/22 1225 07/05/22 1238 07/05/22 1305 07/05/22 1317  BP: (!) 146/76 (!) 146/75 (!) 151/68 (!) 153/66  Pulse: 78 73 65 64  Resp: (!) 25 20 (!) 23 16  Temp: 97.7 F (36.5 C)     TempSrc: Oral     SpO2: 91% 92% 92% 90%  Weight:      Height:       No intake or output data in the 24 hours ending 07/05/22 1325    07/05/2022    3:30 AM 07/04/2022   10:56 PM 07/04/2022    9:43 AM  Last 3 Weights  Weight (lbs) 156 lb 12.8 oz 156 lb 12.8 oz 162 lb  Weight (kg) 71.124 kg 71.124 kg 73.483 kg     Body mass index is 22.5 kg/m.  General:  Well nourished, well developed, in no acute distress. In dialysis HEENT: normal Neck: no JVD Vascular: No carotid bruits; Distal pulses 2+ bilaterally Cardiac:  normal S1, S2; RRR; no murmur  Lungs:  clear to auscultation bilaterally, no wheezing, rhonchi or rales  Abd: soft, nontender, no hepatomegaly  Ext: no edema Musculoskeletal:  No deformities, BUE and BLE strength normal and equal Skin: warm and dry  Neuro:  CNs 2-12 intact, no focal abnormalities noted Psych:  Normal affect   EKG:  The EKG was personally reviewed and demonstrates: Sinus rhythm with first-degree AV block.  T wave inversions noted in V2 and V3.  Mild ST depression in V5 and V6. Telemetry:  Unavailable to view  Relevant CV Studies:  05/25/2018 Lexiscan stress test (per [facility] document)  Negative for  ischemia by ECG criteria. Target HR achieved.  11/11/2020 TTE  IMPRESSIONS     1. Left ventricular ejection fraction, by estimation, is 60 to 65%. The  left ventricle has normal function. The left ventricle has no regional  wall motion abnormalities. There is mild concentric left ventricular  hypertrophy. Left ventricular diastolic  parameters are consistent with Grade II diastolic dysfunction  (pseudonormalization). Elevated left ventricular end-diastolic pressure.   2. Right ventricular systolic function is normal. The right ventricular  size is normal. There is normal pulmonary artery systolic pressure.   3. Left atrial size was mildly dilated.   4. The mitral valve is normal in structure. Trivial mitral valve  regurgitation. No evidence of mitral stenosis.   5. Tricuspid valve regurgitation is mild to moderate.   6. The aortic valve is calcified. There is mild calcification of the  aortic valve. There is mild thickening of the aortic valve. Aortic valve  regurgitation is mild. Mild aortic valve stenosis. Aortic valve area, by  VTI measures 1.78 cm. Aortic valve  mean gradient measures 12.0 mmHg. Aortic valve Vmax measures 2.40 m/s.   7. The inferior vena cava is normal in size with greater than 50%  respiratory variability, suggesting right atrial pressure of 3 mmHg.   FINDINGS   Left Ventricle: Left ventricular ejection fraction, by estimation, is 60  to 65%. The left ventricle has normal function. The left ventricle has no  regional wall motion abnormalities. The left ventricular internal cavity  size was normal  in size. There is   mild concentric left ventricular hypertrophy. Left ventricular diastolic  parameters are consistent with Grade II diastolic dysfunction  (pseudonormalization). Elevated left ventricular end-diastolic pressure.   Right Ventricle: The right ventricular size is normal. No increase in  right ventricular wall thickness. Right ventricular systolic  function is  normal. There is normal pulmonary artery systolic pressure. The tricuspid  regurgitant velocity is 2.61 m/s, and   with an assumed right atrial pressure of 3 mmHg, the estimated right  ventricular systolic pressure is 28.7 mmHg.   Left Atrium: Left atrial size was mildly dilated.   Right Atrium: Right atrial size was normal in size.   Pericardium: There is no evidence of pericardial effusion.   Mitral Valve: The mitral valve is normal in structure. There is moderate  thickening of the anterior mitral valve leaflet(s). Trivial mitral valve  regurgitation. No evidence of mitral valve stenosis.   Tricuspid Valve: The tricuspid valve is normal in structure. Tricuspid  valve regurgitation is mild to moderate. No evidence of tricuspid  stenosis.   Aortic Valve: The aortic valve is calcified. There is mild calcification  of the aortic valve. There is mild thickening of the aortic valve. Aortic  valve regurgitation is mild. Mild aortic stenosis is present. Aortic valve  mean gradient measures 12.0  mmHg. Aortic valve peak gradient measures 23.0 mmHg. Aortic valve area, by  VTI measures 1.78 cm.   Pulmonic Valve: The pulmonic valve was normal in structure. Pulmonic valve  regurgitation is not visualized. No evidence of pulmonic stenosis.   Aorta: The aortic root is normal in size and structure.   Venous: The inferior vena cava is normal in size with greater than 50%  respiratory variability, suggesting right atrial pressure of 3 mmHg.   IAS/Shunts: No atrial level shunt detected by color flow Doppler.   Laboratory Data:  High Sensitivity Troponin:   Recent Labs  Lab 07/04/22 0755 07/04/22 1136 07/04/22 1541 07/05/22 0215  TROPONINIHS 130* 137* 120* 107*     Chemistry Recent Labs  Lab 07/04/22 0755 07/05/22 0215  NA 136 136  K 3.8 4.6  CL 92* 94*  CO2 30 27  GLUCOSE 110* 96  BUN 27* 38*  CREATININE 9.44* 10.91*  CALCIUM 9.0 8.7*  MG  --  2.1  GFRNONAA  5* 4*  ANIONGAP 14 15    Recent Labs  Lab 07/05/22 0215  PROT 6.9  ALBUMIN 3.0*  AST 14*  ALT 7  ALKPHOS 69  BILITOT 0.7   Lipids No results for input(s): "CHOL", "TRIG", "HDL", "LABVLDL", "LDLCALC", "CHOLHDL" in the last 168 hours.  Hematology Recent Labs  Lab 07/04/22 0755 07/05/22 0215  WBC 10.1 9.9  RBC 3.26* 2.94*  2.95*  HGB 10.0* 8.9*  HCT 31.3* 27.6*  MCV 96.0 93.9  MCH 30.7 30.3  MCHC 31.9 32.2  RDW 14.8 14.9  PLT 309 260   Thyroid No results for input(s): "TSH", "FREET4" in the last 168 hours.  BNP Recent Labs  Lab 07/05/22 0301  BNP 2,472.8*    DDimer No results for input(s): "DDIMER" in the last 168 hours.   Radiology/Studies:  CT Head Wo Contrast  Result Date: 07/04/2022 CLINICAL DATA:  Headaches EXAM: CT HEAD WITHOUT CONTRAST TECHNIQUE: Contiguous axial images were obtained from the base of the skull through the vertex without intravenous contrast. RADIATION DOSE REDUCTION: This exam was performed according to the departmental dose-optimization program which includes automated exposure control, adjustment of the mA and/or kV according  to patient size and/or use of iterative reconstruction technique. COMPARISON:  Previous studies including the examination of 12/24/2021 FINDINGS: Brain: No acute intracranial findings are seen. There are no signs of bleeding within the cranium. Cortical sulci are prominent. There is decreased density in periventricular and subcortical white matter. Vascular: Unremarkable. Skull: Unremarkable. Sinuses/Orbits: Visualized paranasal sinuses are unremarkable. There is a high density in the lateral and superior aspect of right optic globe suggesting previous intervention. Other: None. IMPRESSION: No acute intracranial findings are seen Atrophy.  Small-vessel disease. Electronically Signed   By: Elmer Picker M.D.   On: 07/04/2022 12:01   DG Chest 2 View  Result Date: 07/04/2022 CLINICAL DATA:  Chest pain EXAM: CHEST - 2  VIEW COMPARISON:  Prior chest x-ray 04/09/2022 FINDINGS: Progressive cardiomegaly. Atherosclerotic calcification present in the transverse aorta. Metal stent projects over the right innominate vein. Significantly increased diffuse interstitial airspace opacities bilaterally consistent with pulmonary edema. Small bilateral pleural effusions. Fluid is also entrapped within the major fissure on the lateral view. No pneumothorax. No acute osseous abnormality. IMPRESSION: 1. Progressive cardiomegaly, interstitial pulmonary edema and bilateral pleural effusions with fluid entrapped in the fissures. Findings may represent CHF or fluid overload in the setting of dialysis. Electronically Signed   By: Jacqulynn Cadet M.D.   On: 07/04/2022 08:37     Assessment and Plan:   Chest pain Stable angina  Patient with around a month of stereotypical stable angina symptoms.  2019 Lexiscan stress test negative for ischemic ECG changes. TTE in 2022 with left ventricular ejection fraction, by estimation, is 60 to 65%. The  left ventricle has normal function. The left ventricle has no regional  wall motion abnormalities. Troponin this admission 130, 137, 120, 107. This is higher than HsTroponins from 2022 when patient had similar chest pain. No chest pain per patient since admission to the hospital.  ECG today with sinus rhythm with first-degree AV block.  T wave inversions noted in V2 and V3.  Mild ST depression in V5 and V6. Repeat ECG pending Given multi-year history of episodic chest pain with exertion and elevated troponin, would be reasonable to consider LHC to rule in/out CAD. As patient is ESRD on dialysis, would also prefer to complete a RHC to assess for adequate volume removal.  Continue daily ASA 29m Continue Heparin Continue high intensity statin  Chronic Diastolic Heart Failure  Although patient appears relatively euvolemic on exam, BNP of 2472 in the setting of ESRD on dialysis. He reports that his  weight has been down in recent weeks and denies significant fluid retention. 2022 TTE with grade II diastolic dysfunction. CXR this admission showing interstitial pulmonary edema and bilateral pleural effusions.  Plan to assess volume status with RHC Repeat TTE pending  Hypertension  Patient on home regimen of Coreg 6.231mBID, Amlodipine 1041mD, Losartan 62m95m. Hypertensive this admission, will increase Coreg to 12.5mg 4m. Patient may benefit from Hydralazine for afterload reduction.  Acute anemia on anemia of chronic disease  HGB decreased to 8.9. Management per primary team. Cardiology goal >8.0.   ESRD on dialysis  Patient with no recent missed dialysis sessions. Dialysis today per nephrology.  Risk Assessment/Risk Scores:     TIMI Risk Score for Unstable Angina or Non-ST Elevation MI:   The patient's TIMI risk score is 4, which indicates a 20% risk of all cause mortality, new or recurrent myocardial infarction or need for urgent revascularization in the next 14 days.  New York Heart Association (NYHA) Functional  Class NYHA Class II        For questions or updates, please contact [place] Please consult [email or web address] for contact info under    Signed, Lily Kocher, PA-C  07/05/2022 1:25 PM

## 2022-07-05 NOTE — Progress Notes (Signed)
Mobility Specialist - Progress Note   07/05/22 0915  Mobility  Activity Transferred from chair to bed  Level of Assistance Minimal assist, patient does 75% or more  Assistive Device Front wheel walker  Activity Response Tolerated well  Mobility Referral Yes  $Mobility charge 1 Mobility   Pt received in chair and requesting to go to bed. Pt was MinA to stand and pivot. Pt was left in bed with all needs met.   Larey Seat

## 2022-07-05 NOTE — Progress Notes (Signed)
    Shared Decision Making/Informed Consent The risks [stroke (1 in 1000), death (1 in 1000), kidney failure [usually temporary] (1 in 500), bleeding (1 in 200), allergic reaction [possibly serious] (1 in 200)], benefits (diagnostic support and management of coronary artery disease) and alternatives of a cardiac catheterization (left and right with possible PCI) were discussed in detail with Travis [name] and he is willing to proceed.  Lily Kocher PA-C [place]

## 2022-07-05 NOTE — Consult Note (Addendum)
[facility] Renal Consultation Note    Indication for Consultation:  Management of ESRD/hemodialysis; anemia, hypertension/volume and secondary hyperparathyroidism  LGX:QJJHERD, [place], MD  HPI: Travis [name] is a 81 y.o. male with ESRD on HD MWF at [place].  Past medical history significant for combined CHF, Hx NSTEMI, Hx DVT, HTN, Pulm fibrosis, pulmonary HTN, Sjogren's disease and dementia.   Patient seen and examined in room.  No family present.  Presented to the ED at [facility] due to CP.  States he has been having CP with exertion for about 2-3 weeks.  States it began after having cataract surgery.  Currently CP free.  Admits to non productive cough and orthopnea for similar amount of time.  He believes his pain is related to his lungs and not his heart.  Denies current SOB, abdominal pain, n/v/d, fever and chills.  Appetite has decreased and believes he is losing weight.  Compliant with all dialysis treatments with last full treatment 10/20, leaving 0.5kg under his dry weight.  No issues with dialysis.   Pertinent findings in work up include tachypnea, CXR with interstitial pulmonary edema, elevated troponin (now trending down), BNP 2472, K 4.6, Ca 8.7, phos 6.5 and alb 3.0. Patient admitted for further evaluation and management.   Past Medical History:  Diagnosis Date   Acute on chronic respiratory failure with hypoxia (HCC) 01/28/2015   Anemia    Arthritis    Chest pain    Chronic combined systolic and diastolic CHF (congestive heart failure) ([place])    a. Etiology of low EF not defined.   Chronic respiratory failure (HCC)    DVT (deep venous thrombosis) ([place])    a. 01/2015: RLE DVT. VQ scan intermediate probability. Underwent renal bx complicated by perinephric hematoma; anticoagulation stopped and IVC filter placed.   ESRD on hemodialysis ([place]) 02/2015   a. 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 Received plasma  exchange and steroids but didn't respond and ended up starting hemodialysis June 2016.    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 04/2015 in HD in setting of BP 50s.   Unintentional weight loss    Wears partial dentures    Past Surgical History:  Procedure Laterality Date   AV FISTULA PLACEMENT Right 02/17/2015   Procedure: INSERTION OF RIGHT ARM  ARTERIOVENOUS (AV) GORE-TEX GRAFT ;  Surgeon: Elam Dutch, MD;  Location: [place];  Service: Vascular;  Laterality: Right;   FLEXIBLE SIGMOIDOSCOPY N/A 08/04/2015   Procedure: FLEXIBLE SIGMOIDOSCOPY;  Surgeon: Wilford Corner, MD;  Location: [facility] ENDOSCOPY;  Service: Endoscopy;  Laterality: N/A;   HEMORROIDECTOMY  1999   INSERTION OF DIALYSIS CATHETER N/A 02/17/2015   Procedure:  INSERTION OF DIALYSIS CATHETER RIGHT INTERNAL JUGULAR VEIN;  Surgeon: Elam Dutch, MD;  Location: [facility] OR;  Service: Vascular;  Laterality: N/A;   MULTIPLE TOOTH EXTRACTIONS     PERIPHERAL VASCULAR BALLOON ANGIOPLASTY Right 01/23/2019   Procedure: PERIPHERAL VASCULAR BALLOON ANGIOPLASTY;  Surgeon: Elam Dutch, MD;  Location: [place] CV LAB;  Service: Cardiovascular;  Laterality: Right;   REVISION OF ARTERIOVENOUS GORETEX GRAFT Right 03/29/2017   Procedure: REVISION OF  ARTERIOVENOUS GORETEX GRAFT;  Surgeon: Conrad Abercrombie, MD;  Location: [facility] OR;  Service: Vascular;  Laterality: Right;   Surgical procedure to remove a mole as a child Right Eye area   At around 22 years old   Family History  Problem Relation Age of Onset   Hypertension Mother    Healthy Father    Hypertension Sister    Hypertension Brother    Social History:  reports that he has never smoked. He has never used smokeless tobacco. He reports that he does not drink alcohol and does not use drugs. No Active Allergies Prior to Admission medications   Medication Sig Start Date End Date Taking? Authorizing Provider  Accu-Chek Softclix Lancets lancets Use to test blood glucose four times daily as directed. 05/26/21   Thurnell Lose, MD  amLODipine (NORVASC) 10 MG tablet Take 1 tablet (10 mg total) by mouth daily. Patient not taking: Reported on 12/19/2021 05/26/21   Thurnell Lose, MD  aspirin EC 81 MG EC tablet Take 1 tablet (81 mg total) by mouth daily. 04/23/15   Reyne Dumas, MD  atorvastatin (LIPITOR) 40 MG tablet Take 1 tablet (40 mg total) by mouth daily at 6 PM. 04/23/15   Reyne Dumas, MD  Blood Glucose Monitoring Suppl (BLOOD GLUCOSE MONITOR SYSTEM) w/Device KIT Use up to four times daily as directed. 05/26/21   Thurnell Lose, MD  Brinzolamide-Brimonidine 1-0.2 % SUSP Place 1 drop into both eyes in the morning, at noon, and at bedtime. 11/03/21   [provider]  calcitRIOL (ROCALTROL) 0.25 MCG capsule Take 13 capsules (3.25 mcg total) by mouth every Monday, Wednesday, and Friday with hemodialysis. 12/25/21   Cherene Altes, MD  carvedilol (COREG) 6.25 MG tablet Take 1 tablet (6.25 mg total) by mouth 2 (two) times daily with a meal. 12/25/21   Cherene Altes, MD  cinacalcet (SENSIPAR) 30 MG tablet Take 3 tablets (90 mg total) by mouth every Monday, Wednesday, and Friday with hemodialysis. 12/25/21   Cherene Altes, MD  cyanocobalamin 500 MCG tablet Take 1 tablet (500  mcg total) by mouth daily. 12/26/14   Donne Hazel, MD  ferric gluconate 62.5 mg in sodium chloride 0.9 % 100 mL Inject 62.5 mg into the vein every Wednesday with hemodialysis. 12/30/21   Cherene Altes, MD  glucose blood test strip Use to test blood glucose four times daily as directed 05/26/21   Thurnell Lose, MD  latanoprost (XALATAN) 0.005 % ophthalmic solution Place 1 drop into both eyes at bedtime. 05/05/21   [provider]  losartan (COZAAR) 25 MG tablet Take 1 tablet (25 mg total) by mouth at bedtime. 12/25/21   Cherene Altes, MD  omeprazole (PRILOSEC) 40 MG capsule Take 40 mg by mouth daily. 07/31/15   [provider]  OXYGEN Inhale 2 L into the lungs daily.    [provider]  sevelamer carbonate (RENVELA) 800 MG tablet Take 2 tablets (1,600 mg total) by mouth 3 (three) times daily with meals. Patient  not taking: Reported on 12/19/2021 05/17/15   Cherene Altes, MD  timolol (TIMOPTIC-XR) 0.5 % ophthalmic gel-forming Place 1 drop into both eyes daily. 11/03/21   [provider]   Current Facility-Administered Medications  Medication Dose Route Frequency Provider Last Rate Last Admin   acetaminophen (TYLENOL) tablet 650 mg  650 mg Oral Q6H PRN Howerter, Justin B, DO       Or   acetaminophen (TYLENOL) suppository 650 mg  650 mg Rectal Q6H PRN Howerter, Justin B, DO       amLODipine (NORVASC) tablet 10 mg  10 mg Oral Daily Davonna Belling, MD   10 mg at 07/04/22 1550   aspirin chewable tablet 81 mg  81 mg Oral Daily Wynetta Fines T, MD   81 mg at 07/04/22 1417   atorvastatin (LIPITOR) tablet 40 mg  40 mg Oral q1800 Davonna Belling, MD   40 mg at 07/04/22 1834   carvedilol (COREG) tablet 6.25 mg  6.25 mg Oral BID WC Davonna Belling, MD   6.25 mg at 07/04/22 1834   heparin ADULT infusion 100 units/mL (25000 units/274m)  1,050 Units/hr Intravenous Continuous LErenest Blank RPH 10.5 mL/hr at 07/05/22 0321 1,050 Units/hr at 07/05/22 0321    losartan (COZAAR) tablet 25 mg  25 mg Oral QHS PDavonna Belling MD   25 mg at 07/04/22 2357   nitroGLYCERIN (NITROSTAT) SL tablet 0.4 mg  0.4 mg Sublingual Q30 min PRN Howerter, Justin B, DO       ondansetron (ZOFRAN) injection 4 mg  4 mg Intravenous Q6H PRN Howerter, Justin B, DO       pantoprazole (PROTONIX) injection 40 mg  40 mg Intravenous Q24H Howerter, Justin B, DO   40 mg at 07/05/22 0748   sevelamer carbonate (RENVELA) tablet 1,600 mg  1,600 mg Oral TID WC PDavonna Belling MD   1,600 mg at 07/05/22 0748   Labs: Basic Metabolic Panel: Recent Labs  Lab 07/04/22 0755 07/05/22 0215  NA 136 136  K 3.8 4.6  CL 92* 94*  CO2 30 27  GLUCOSE 110* 96  BUN 27* 38*  CREATININE 9.44* 10.91*  CALCIUM 9.0 8.7*  PHOS  --  6.5*   Liver Function Tests: Recent Labs  Lab 07/05/22 0215  AST 14*  ALT 7  ALKPHOS 69  BILITOT 0.7  PROT 6.9  ALBUMIN 3.0*   Recent Labs  Lab 07/05/22 0215  LIPASE 36    CBC: Recent Labs  Lab 07/04/22 0755 07/05/22 0215  WBC 10.1 9.9  HGB 10.0* 8.9*  HCT 31.3* 27.6*  MCV 96.0 93.9  PLT 309 260   CT Head Wo Contrast  Result Date: 07/04/2022 CLINICAL DATA:  Headaches EXAM: CT HEAD WITHOUT CONTRAST TECHNIQUE: Contiguous axial images were obtained from the base of the skull through the vertex without intravenous contrast. RADIATION DOSE REDUCTION: This exam was performed according to the departmental dose-optimization program which includes automated exposure control, adjustment of the mA and/or kV according to patient size and/or use of iterative reconstruction technique. COMPARISON:  Previous studies including the examination of 12/24/2021 FINDINGS: Brain: No acute intracranial findings are seen. There are no signs of bleeding within the cranium. Cortical sulci are prominent. There is decreased density in periventricular and subcortical white matter. Vascular: Unremarkable. Skull: Unremarkable. Sinuses/Orbits: Visualized paranasal sinuses are  unremarkable. There is a high density in the lateral and superior aspect of right optic globe suggesting previous intervention. Other: None. IMPRESSION: No acute intracranial findings are seen Atrophy.  Small-vessel  disease. Electronically Signed   By: Elmer Picker M.D.   On: 07/04/2022 12:01   DG Chest 2 View  Result Date: 07/04/2022 CLINICAL DATA:  Chest pain EXAM: CHEST - 2 VIEW COMPARISON:  Prior chest x-ray 04/09/2022 FINDINGS: Progressive cardiomegaly. Atherosclerotic calcification present in the transverse aorta. Metal stent projects over the right innominate vein. Significantly increased diffuse interstitial airspace opacities bilaterally consistent with pulmonary edema. Small bilateral pleural effusions. Fluid is also entrapped within the major fissure on the lateral view. No pneumothorax. No acute osseous abnormality. IMPRESSION: 1. Progressive cardiomegaly, interstitial pulmonary edema and bilateral pleural effusions with fluid entrapped in the fissures. Findings may represent CHF or fluid overload in the setting of dialysis. Electronically Signed   By: Jacqulynn Cadet M.D.   On: 07/04/2022 08:37    ROS: All others negative except those listed in HPI.   Physical Exam: Vitals:   07/05/22 0330 07/05/22 0536 07/05/22 0754 07/05/22 0800  BP: 134/75 (!) 156/72 (!) 170/85 (!) 156/62  Pulse: 73  84 80  Resp: _0 (!) 21  Temp: 98.2 F (36.8 C) 98.3 F (36.8 C)  98 F (36.7 C)  TempSrc: Oral Axillary  Oral  SpO2: 96% 94% 100% 96%  Weight: 71.1 kg     Height:         General: WDWN elderly male in NAD Head: NCAT sclera not icteric MMM Neck: Supple. No lymphadenopathy Lungs: +crackles in bases, nml WOB on RA Heart: RRR. No murmur, rubs or gallops.  Abdomen: soft, nontender, +BS, no guarding, no rebound tenderness.  Lower extremities:no edema, ischemic changes, or open wounds  Neuro: AAOx3. Moves all extremities spontaneously. Psych:  Responds to questions appropriately  with a normal affect. Dialysis Access: LU AVG +b/t  Dialysis Orders:  MWF - [facility]  3.5hrs, BFR 400, DFR 1.5,  EDW 71.8, 2K/ 2Ca UFP 2  Access: RU AVG  Heparin none Mircera 30 mcg q2wks - last 10/4 Calcitriol 2.75 mcg PO qHD Sensipar 8m qHD   Assessment/Plan:  Pulmonary edema - interstitial edema noted on CXR.  Per weights close to dry.  Likely weight loss recently and needs lowering.  Plan for UF 3L as tolerated.  CP - troponin now trending down. Cardiology consulted.   ESRD -  on HD MWF, orders written for HD today per regular schedule.   Hypertension - BP mildly elevated. Expect improvement with UF. Not on antihypertensives at home.  Takes midodrine pre HD for BP support.   Anemia of CKD - Hgb 8.9 - will give ESA with HD today.   Secondary Hyperparathyroidism -  Ca in goal. Phos elevated.  Continue binders, sensipar and calcitriol  Nutrition - Renal diet w/fluid restrictions Combined CHF - volume management w/HD  LJen Mow PA-C [facility] 07/05/2022, 9:09 AM   Seen and examined independently.  Agree with note and exam as documented above by physician extender and as noted here.  ESRD on HD, dementia, pulmonary fibrosis [facility].  Does have dementia - near his baseline but not quite.  He has some trouble clarifying his symptoms but does say that he has no current chest pain and shortness of breath is better.    General adult male in bed in no acute distress HEENT normocephalic atraumatic extraocular movements intact sclera anicteric Neck supple trachea midline Lungs clear to auscultation bilaterally normal work of breathing at rest  Heart S1S2 no rub  Abdomen soft nontender nondistended  Extremities no edema  Psych normal mood and affect Access RAVF in use   Pulmonary edema - optimize volume with HD.  Note also with pulm fibrosis so decreased resp reserve    Chest pain - improved. Per primary team.   Optimize volume with HD.   ESRD - HD today per MWF schedule   HTN - controlled   Anemia CKD - on mircera every 4 weeks - last early October and may need again (early) this admit   Secondary hyperparathyroidism - continue outpatient regimen   Claudia Desanctis, MD 07/05/2022  2:37 PM

## 2022-07-05 NOTE — Progress Notes (Signed)
ANTICOAGULATION CONSULT NOTE   Pharmacy Consult for Heparin Indication: chest pain/ACS  No Active Allergies  Patient Measurements: Height: _0  (177.8 cm) Weight: 71.1 kg (156 lb 12.8 oz) IBW/kg (Calculated) : 73 Heparin Dosing Weight: 73.5 kg  Vital Signs: Temp: 98 F (36.7 C) (10/23 0800) Temp Source: Oral (10/23 0800) BP: 156/62 (10/23 0800) Pulse Rate: 80 (10/23 0800)  Labs: Recent Labs    07/04/22 0755 07/04/22 1136 07/04/22 1541 07/04/22 2024 07/05/22 0215 07/05/22 1017  HGB 10.0*  --   --   --  8.9*  --   HCT 31.3*  --   --   --  27.6*  --   PLT 309  --   --   --  260  --   LABPROT  --  13.1  --   --  13.9  --   INR  --  1.0  --   --  1.1  --   HEPARINUNFRC  --   --   --  0.40 <0.10* 0.28*  CREATININE 9.44*  --   --   --  10.91*  --   TROPONINIHS 130* 137* 120*  --  107*  --      Estimated Creatinine Clearance: 5.4 mL/min (A) (by C-G formula based on SCr of 10.91 mg/dL (H)).   Medical History: Past Medical History:  Diagnosis Date   Acute on chronic respiratory failure with hypoxia ([place]) 01/28/2015   Anemia    Arthritis    Chest pain    Chronic combined systolic and diastolic CHF (congestive heart failure) ([place])    a. Etiology of low EF not defined.   Chronic respiratory failure (HCC)    DVT (deep venous thrombosis) ([place])    a. 01/2015: RLE DVT. VQ scan intermediate probability. Underwent renal bx complicated by perinephric hematoma; anticoagulation stopped and IVC filter placed.   ESRD on hemodialysis ([place]) 02/2015   a. 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 Received plasma exchange and steroids but didn't respond and ended up starting hemodialysis June 2016.    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 04/2015 in HD in setting of BP 50s.   Unintentional weight loss    Wears partial dentures     Medications:  Medications Prior to Admission  Medication Sig Dispense Refill Last Dose   Accu-Chek Softclix Lancets lancets Use to test blood glucose four times daily as directed. 100 each 1    amLODipine (NORVASC) 10 MG tablet Take 1 tablet (10 mg total) by mouth daily. (Patient not taking: Reported on 12/19/2021) 30 tablet 0    aspirin EC 81 MG EC tablet Take 1 tablet (81 mg total) by mouth  daily. 30 tablet 0    atorvastatin (LIPITOR) 40 MG tablet Take 1 tablet (40 mg total) by mouth daily at 6 PM. 30 tablet 0    Blood Glucose Monitoring Suppl (BLOOD GLUCOSE MONITOR SYSTEM) w/Device KIT Use up to four times daily as directed. 1 kit 1    Brinzolamide-Brimonidine 1-0.2 % SUSP Place 1 drop into both eyes in the morning, at noon, and at bedtime.      calcitRIOL (ROCALTROL) 0.25 MCG capsule Take 13 capsules (3.25 mcg total) by mouth every Monday, Wednesday, and Friday with hemodialysis.      carvedilol (COREG) 6.25 MG tablet Take 1 tablet (6.25 mg total) by mouth 2 (two) times daily with a meal. 60 tablet 1    cinacalcet (SENSIPAR) 30 MG tablet Take 3 tablets (90 mg total) by mouth every Monday, Wednesday, and Friday with hemodialysis. 60 tablet     cyanocobalamin 500 MCG tablet Take 1 tablet (500 mcg total) by mouth daily. 30 tablet 0    ferric gluconate 62.5 mg in sodium chloride 0.9 % 100 mL Inject 62.5 mg  into the vein every Wednesday with hemodialysis.      glucose blood test strip Use to test blood glucose four times daily as directed 100 each 1    latanoprost (XALATAN) 0.005 % ophthalmic solution Place 1 drop into both eyes at bedtime.      losartan (COZAAR) 25 MG tablet Take 1 tablet (25 mg total) by mouth at bedtime. 30 tablet 1    omeprazole (PRILOSEC) 40 MG capsule Take 40 mg by mouth daily.  11    OXYGEN Inhale 2 L into the lungs daily.      sevelamer carbonate (RENVELA) 800 MG tablet Take 2 tablets (1,600 mg total) by mouth 3 (three) times daily with meals. (Patient not taking: Reported on 12/19/2021) 90 tablet 0    timolol (TIMOPTIC-XR) 0.5 % ophthalmic gel-forming Place 1 drop into both eyes daily.       Scheduled:  Infusions:  PRN:   Assessment: 26 yom with a history of anemia, HF, chronic resp failure, DVT s/p IVC, ESRD on HD MWF, HTN, NSTEMI, pulmonary fibrosis. Patient is presenting with chest pain. Heparin per pharmacy consult placed for chest pain/ACS.  Patient is not on anticoagulation prior to arrival.  Hgb 10 - unchanged from earlier this month; plt 309  Heparin level this morning is 0.28, slightly below goal.  10/23 AM update:  Heparin level low No issues per RN  Goal of Therapy:  Heparin level 0.3-0.7 units/ml Monitor platelets by anticoagulation protocol: Yes   Plan:  Inc heparin to 1150 units/hr Repeat heparin level in 8 hrs. Daily heparin level and CBC.   [place] Crane, Roylene Reason, BCCP Clinical Pharmacist  07/05/2022 11:18 AM   [facility] pharmacy phone numbers are listed on [email or web address]

## 2022-07-05 NOTE — Progress Notes (Signed)
1925-During bedside shift report pt becoming more anxious and restless pulling at monitoring equipment. Dr. Bridgett Larsson notified about pt behavior. Pt's daughter [name] notified and told this nurse will be staying the next two nights with pt.  1945-Dr. Bridgett Larsson placed orders for pt to receive Melatonin 10 mg at bedtime for rest.     2020-Pt already received medications as ordered. Pt's daughter arrived to be with pt at bedside. Pt is calming down at this time with daughter. Will continue to monitor pt behavior closely.

## 2022-07-05 NOTE — H&P (Signed)
History and Physical    PLEASE NOTE THAT DRAGON DICTATION SOFTWARE WAS USED IN THE CONSTRUCTION OF THIS NOTE.   Travis [name] HFS:[id number] DOB: [birth year] DOA: 07/04/2022  PCP: [name], MD  Patient coming from: home   I have personally briefly reviewed patient's old medical records in [place]  Chief Complaint: Chest pain  HPI: Travis [name] is a 81 y.o. male with medical history significant for end-stage renal disease on hemodialysis on Monday, Wednesday, Friday schedule, chronic diastolic heart failure, pulm hypertension, GERD, DVT of the right lower extremity in May 2016, anemia of chronic kidney disease associated baseline hemoglobin range 11-13, who is admitted to [facility] on 07/04/2022 by way of transfer from [facility] emergency department with acute on chronic diastolic heart failure after presenting from home to the latter facility complaining of chest pain.   The patient reports proximately 1 month of intermittent episodes of sharp left-sided chest discomfort with radiation into the shoulder.  He notes that these episodes occur at rest but also occur while exerting himself.  He notes that the episodes that occur with rest appear to intensify with exertion, and improved with rest.  He has not taken any nitroglycerin over the course the last month.  Episodes of chest discomfort are self-limited, typically resolving spontaneously 5 to 10 minutes after onset.  No radiation to the back.  He provides the example that, when he walks his trash cans out to the curb he begins to experience exertional left-sided chest discomfort that resolves with rest.  He notes that outside of the last month, he was able to perform the same task without any chest discomfort.  Most recent chest pain episode occurred on 07/04/2022.  He confirms that he has been chest pain-free throughout his stay [facility] ED, and is experiencing no chest pain at this time.  Outside of the last  month, he has not previously experienced similar episodes of chest discomfort.  He does not feel that over the last month the episodes are becoming more frequent or more intense or lasting longer.  However, because these episodes are new to him and because the are continuing to occur, he presents to [facility] emergency department for further evaluation and management thereof.   Patient's chest pain is nonpositional, and not reproducible with direct outpatient over the anterior chest wall.  He does note some intensification of his left-sided chest discomfort with deep inspiration.  No associated nausea, vomiting, palpitations, diaphoresis, dizziness, recently, or syncope.  He also denies any associated subjective fever, chills, rigors, generalized myalgias.  No recent calf tenderness or new lower extremity erythema.  He acknowledges a history of DVT in the right lower extremity in May 2016.  Just prior to the onset of these episodes of chest discomfort patient conveys that he underwent a surgical procedure to the right eye.  Otherwise, no recent trauma or travel.  No known history of underlying malignancy.  Over the last 4 to 5 days, he is also been experiencing new onset shortness of breath associated with orthopnea.  He acknowledges underlying end-stage renal disease for which she is on hemodialysis on Monday, Wednesday, Friday schedule.  He reports that he is very compliant and attending all of his hemodialysis sessions, noting that he is missed any recent sessions.  Most recent HD session occurred on Friday, 10/20, but noted no significant improvement in shortness of breath following HD.   Per chart review, it appears that the patient underwent nuclear stress test in September 2019  at the [facility], which appears to been read as low risk, without any evidence of reversible ischemia.  Most recent echocardiogram occurred in March 2022 was notable for LVEF 60 65%, no focal wall motion abnormalities,  demonstrating evidence of grade 2 diastolic dysfunction.  This echo was also notable for normal right ventricular systolic function, mildly dilated left atrium, trivial mitral gravitation mild to moderate tricuspid regurgitation, mild aortic regurgitation and mild aortic stenosis, with aortic valve area of 1.78 cm2 calculated via continuity equation using VTI.  Per chart review, he also has a history of anemia of chronic kidney disease with baseline hemoglobin range 11-13.  Hemoglobin in April noted to be 12.3, while hemoglobin on 06/25/2022 noted to be 10.3.  No recent melena or hematochezia.  Per review of systems, patient was also complaining of some intermittent headaches behind his postoperative right eye .  Not associate with any acute focal weakness or acute focal sensory changes.    [facility] ED Course:  Vital signs in the ED were notable for the following: Afebrile; heart rate 85-46; systolic blood pressures initially in the 170s to 180s, subsequent improving into the range of 130s to 140s following antihypertensive intervention as described low; respiratory rate 16-23, oxygen saturation 95 to 98% on room air.  Labs were notable for the following: BMP notable for the following: Sodium 136, potassium 3.8, bicarbonate 30.  Initial high-sensitivity troponin I found to be 130, with second troponin noted to be 137 and third found to be trending down to 120.  This is relative to most recent prior high-sensitivity troponin I value of 65 in February 2023.  CBC notable for platelets of count 10,100.  Hemoglobin 10 associated with normocytic/recurrent findings of a nonelevated RDW, platelet count 309.  INR 1.0  Imaging and additional notable ED work-up: Compared to most recent prior EKG, which was performed on 12/21/2021, EKG at [facility] showed sinus rhythm with first-degree AV block, heart rate 86 and prolonged QTc of 514; EKG also shows T wave flattening in 1 and aVL, which appears unchanged from his  recent prior EKG, while also showing new T wave inversion in V5/V6.  Presenting EKG also notable for unchanged less than 1 mm ST elevation in V2/V3 as well as unchanged less than 1 mm ST depression in V5/V6.  2 view chest x-ray, in comparison to chest x-ray from 04/09/2022 shows progressive cardiomegaly with interval development of interstitial/pulmonary edema, small bilateral pleural effusions, in the absence of any evidence of infiltrate, or pneumothorax.  While in the ED, the following were administered: Norvasc 10 mg p.o. x1, baby aspirin x1, atorvastatin 40 mg p.o. x1, Coreg 6.25 mg p.o. x1, losartan, labetalol 10 mg IV x1, heparin bolus followed by initiation heparin drip.  Subsequently, the patient was admitted to med telemetry at [facility] for further evaluation management acute on chronic diastolic heart failure as well as chest pain with elevated troponin with presenting labs also notable for subacute anemia superimposed on anemia chronic kidney disease.     Review of Systems: As per HPI otherwise 10 point review of systems negative.   Past Medical History:  Diagnosis Date   Acute on chronic respiratory failure with hypoxia (HCC) 01/28/2015   Anemia    Arthritis    Chest pain    Chronic combined systolic and diastolic CHF (congestive heart failure) ([place])    a. Etiology of low EF not defined.   Chronic respiratory failure (HCC)    DVT (deep venous thrombosis) ([place])  a. 01/2015: RLE DVT. VQ scan intermediate probability. Underwent renal bx complicated by perinephric hematoma; anticoagulation stopped and IVC filter placed.   ESRD on hemodialysis ([place]) 02/2015   a. 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 Received plasma exchange and steroids but didn't respond and ended up starting hemodialysis June 2016.    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 04/2015 in HD in setting of BP 50s.   Unintentional weight loss    Wears partial dentures     Past Surgical History:  Procedure Laterality Date   AV FISTULA PLACEMENT Right 02/17/2015   Procedure: INSERTION OF RIGHT ARM  ARTERIOVENOUS (AV) GORE-TEX GRAFT ;  Surgeon: Elam Dutch, MD;  Location: [place];  Service: Vascular;  Laterality: Right;   FLEXIBLE SIGMOIDOSCOPY N/A 08/04/2015   Procedure: FLEXIBLE SIGMOIDOSCOPY;  Surgeon: Wilford Corner, MD;  Location: [facility] ENDOSCOPY;  Service: Endoscopy;  Laterality: N/A;   HEMORROIDECTOMY  1999   INSERTION OF DIALYSIS CATHETER N/A 02/17/2015   Procedure: INSERTION OF DIALYSIS CATHETER RIGHT INTERNAL JUGULAR VEIN;  Surgeon: Elam Dutch, MD;  Location: [facility];  Service: Vascular;  Laterality: N/A;   MULTIPLE TOOTH EXTRACTIONS     PERIPHERAL VASCULAR BALLOON ANGIOPLASTY Right 01/23/2019   Procedure: PERIPHERAL VASCULAR BALLOON ANGIOPLASTY;  Surgeon: Elam Dutch, MD;  Location: [facility];  Service: Cardiovascular;  Laterality: Right;   REVISION OF ARTERIOVENOUS GORETEX GRAFT Right 03/29/2017   Procedure: REVISION OF ARTERIOVENOUS GORETEX GRAFT;  Surgeon: Conrad Glenwood, MD;  Location: [facility] OR;  Service: Vascular;  Laterality: Right;   Surgical procedure to remove a mole as a child Right Eye area   At around 50 years old    Social  History:  reports that he has never smoked. He has never used smokeless tobacco. He reports that he does not drink alcohol and does not use drugs.   No Active Allergies  Family History  Problem Relation Age of Onset   Hypertension Mother    Healthy Father    Hypertension Sister    Hypertension Brother     Family history reviewed and not pertinent    Prior to Admission medications   Medication Sig Start Date End Date Taking? Authorizing Provider  Accu-Chek Softclix Lancets lancets Use to test blood glucose four times daily as directed. 05/26/21   Thurnell Lose, MD  amLODipine (NORVASC) 10 MG tablet Take 1 tablet (10 mg total) by mouth daily. Patient not taking: Reported on 12/19/2021 05/26/21   Thurnell Lose, MD  aspirin EC 81 MG EC tablet Take 1 tablet (81 mg total) by mouth daily. 04/23/15   Reyne Dumas, MD  atorvastatin (LIPITOR) 40 MG  tablet Take 1 tablet (40 mg total) by mouth daily at 6 PM. 04/23/15   Reyne Dumas, MD  Blood Glucose Monitoring Suppl (BLOOD GLUCOSE MONITOR SYSTEM) w/Device KIT Use up to four times daily as directed. 05/26/21   Thurnell Lose, MD  Brinzolamide-Brimonidine 1-0.2 % SUSP Place 1 drop into both eyes in the morning, at noon, and at bedtime. 11/03/21   [provider]  calcitRIOL (ROCALTROL) 0.25 MCG capsule Take 13 capsules (3.25 mcg total) by mouth every Monday, Wednesday, and Friday with hemodialysis. 12/25/21   Cherene Altes, MD  carvedilol (COREG) 6.25 MG tablet Take 1 tablet (6.25 mg total) by mouth 2 (two) times daily with a meal. 12/25/21   Cherene Altes, MD  cinacalcet (SENSIPAR) 30 MG tablet Take 3 tablets (90 mg total) by mouth every Monday, Wednesday, and Friday with hemodialysis. 12/25/21   Cherene Altes, MD  cyanocobalamin 500 MCG tablet Take 1 tablet (500 mcg total) by mouth daily. 12/26/14   Donne Hazel, MD  ferric gluconate 62.5 mg in sodium chloride 0.9 % 100 mL Inject 62.5 mg into the vein every Wednesday  with hemodialysis. 12/30/21   Cherene Altes, MD  glucose blood test strip Use to test blood glucose four times daily as directed 05/26/21   Thurnell Lose, MD  latanoprost (XALATAN) 0.005 % ophthalmic solution Place 1 drop into both eyes at bedtime. 05/05/21   [provider]  losartan (COZAAR) 25 MG tablet Take 1 tablet (25 mg total) by mouth at bedtime. 12/25/21   Cherene Altes, MD  omeprazole (PRILOSEC) 40 MG capsule Take 40 mg by mouth daily. 07/31/15   [provider]  OXYGEN Inhale 2 L into the lungs daily.    [provider]  sevelamer carbonate (RENVELA) 800 MG tablet Take 2 tablets (1,600 mg total) by mouth 3 (three) times daily with meals. Patient not taking: Reported on 12/19/2021 05/17/15   Cherene Altes, MD  timolol (TIMOPTIC-XR) 0.5 % ophthalmic gel-forming Place 1 drop into both eyes daily. 11/03/21   [provider]     Objective    Physical Exam: Vitals:   07/04/22 2000 07/04/22 2100 07/04/22 2200 07/04/22 2256  BP: 129/68 125/61 118/63 137/68  Pulse:    73  Resp: (!) 24 (!) 24 15 (!) 23  Temp: 98.1 F (36.7 C)   98.2 F (36.8 C)  TempSrc: Oral   Oral  SpO2:    92%  Weight:    71.1 kg  Height:        General: appears to be stated age; alert, oriented Skin: warm, dry, no rash Head:  AT/[place] Mouth:  Oral mucosa membranes appear moist, normal dentition Neck: supple; trachea midline Heart:  RRR; did not appreciate any M/R/G Lungs: CTAB, did not appreciate any wheezes, rales, or rhonchi Abdomen: + BS; soft, ND, NT Vascular: 2+ pedal pulses b/l; 2+ radial pulses b/l Extremities: no peripheral edema, no muscle wasting Neuro: strength and sensation intact in upper and lower extremities b/l   Labs on Admission: I have personally reviewed following labs and imaging studies  CBC: Recent Labs  Lab 07/04/22 0755  WBC 10.1  HGB 10.0*  HCT 31.3*  MCV 96.0  PLT 382   Basic Metabolic Panel: Recent Labs  Lab  07/04/22 0755  NA 136  K 3.8  CL 92*  CO2 30  GLUCOSE 110*  BUN 27*  CREATININE 9.44*  CALCIUM 9.0   GFR: Estimated Creatinine Clearance: 6.3  mL/min (A) (by C-G formula based on SCr of 9.44 mg/dL (H)). Liver Function Tests: No results for input(s): "AST", "ALT", "ALKPHOS", "BILITOT", "PROT", "ALBUMIN" in the last 168 hours. No results for input(s): "LIPASE", "AMYLASE" in the last 168 hours. No results for input(s): "AMMONIA" in the last 168 hours. Coagulation Profile: Recent Labs  Lab 07/04/22 1136  INR 1.0   Cardiac Enzymes: No results for input(s): "CKTOTAL", "CKMB", "CKMBINDEX", "TROPONINI" in the last 168 hours. BNP (last 3 results) No results for input(s): "PROBNP" in the last 8760 hours. HbA1C: No results for input(s): "HGBA1C" in the last 72 hours. CBG: No results for input(s): "GLUCAP" in the last 168 hours. Lipid Profile: No results for input(s): "CHOL", "HDL", "LDLCALC", "TRIG", "CHOLHDL", "LDLDIRECT" in the last 72 hours. Thyroid Function Tests: No results for input(s): "TSH", "T4TOTAL", "FREET4", "T3FREE", "THYROIDAB" in the last 72 hours. Anemia Panel: No results for input(s): "VITAMINB12", "FOLATE", "FERRITIN", "TIBC", "IRON", "RETICCTPCT" in the last 72 hours. Urine analysis:    Component Value Date/Time   COLORURINE AMBER (A) 01/29/2015 2200   APPEARANCEUR CLOUDY (A) 01/29/2015 2200   LABSPEC 1.013 01/29/2015 2200   PHURINE 5.0 01/29/2015 2200   GLUCOSEU NEGATIVE 01/29/2015 2200   HGBUR LARGE (A) 01/29/2015 2200   BILIRUBINUR NEGATIVE 01/29/2015 2200   KETONESUR NEGATIVE 01/29/2015 2200   PROTEINUR 100 (A) 01/29/2015 2200   UROBILINOGEN 0.2 01/29/2015 2200   NITRITE NEGATIVE 01/29/2015 2200   LEUKOCYTESUR NEGATIVE 01/29/2015 2200    Radiological Exams on Admission: CT Head Wo Contrast  Result Date: 07/04/2022 CLINICAL DATA:  Headaches EXAM: CT HEAD WITHOUT CONTRAST TECHNIQUE: Contiguous axial images were obtained from the base of the skull  through the vertex without intravenous contrast. RADIATION DOSE REDUCTION: This exam was performed according to the departmental dose-optimization program which includes automated exposure control, adjustment of the mA and/or kV according to patient size and/or use of iterative reconstruction technique. COMPARISON:  Previous studies including the examination of 12/24/2021 FINDINGS: Brain: No acute intracranial findings are seen. There are no signs of bleeding within the cranium. Cortical sulci are prominent. There is decreased density in periventricular and subcortical white matter. Vascular: Unremarkable. Skull: Unremarkable. Sinuses/Orbits: Visualized paranasal sinuses are unremarkable. There is a high density in the lateral and superior aspect of right optic globe suggesting previous intervention. Other: None. IMPRESSION: No acute intracranial findings are seen Atrophy.  Small-vessel disease. Electronically Signed   By: Elmer Picker M.D.   On: 07/04/2022 12:01   DG Chest 2 View  Result Date: 07/04/2022 CLINICAL DATA:  Chest pain EXAM: CHEST - 2 VIEW COMPARISON:  Prior chest x-ray 04/09/2022 FINDINGS: Progressive cardiomegaly. Atherosclerotic calcification present in the transverse aorta. Metal stent projects over the right innominate vein. Significantly increased diffuse interstitial airspace opacities bilaterally consistent with pulmonary edema. Small bilateral pleural effusions. Fluid is also entrapped within the major fissure on the lateral view. No pneumothorax. No acute osseous abnormality. IMPRESSION: 1. Progressive cardiomegaly, interstitial pulmonary edema and bilateral pleural effusions with fluid entrapped in the fissures. Findings may represent CHF or fluid overload in the setting of dialysis. Electronically Signed   By: Jacqulynn Cadet M.D.   On: 07/04/2022 08:37     EKG: Independently reviewed, with result as described above.    Assessment/Plan   Principal Problem:   Acute on  chronic diastolic CHF (congestive heart failure) (HCC) Active Problems:   Normocytic anemia   ESRD on dialysis (HCC)   HTN (hypertension)   Chest pain   Elevated troponin  Prolonged QT interval   GERD (gastroesophageal reflux disease)       #) Acute on chronic diastolic heart failure: dx of acute decompensation on the basis of presenting 4 to 5 days of progressive shortness of breath associated with orthopnea, with chest x-ray showing interval development of interstitial/pulmonary edema with new small bilateral pleural effusions. This is in the context of a known history of chronic diastolic heart failure, with most recent echocardiogram performed in March 2022, which included finding of grade 2 diastolic dysfunction as well as LVEF 60 to 65%, with additional findings as conveyed above.  The above chest x-ray findings occurred in spite of good compliance with hemodialysis in the setting of end-stage renal disease.   Etiology leading to presenting acutely decompensated heart failure is currently unclear.  He has been experiencing approximately 1 month of left-sided chest discomfort with some typical features, warranting further evaluation for ACS/unstable angina, as further detailed below.  Differential also includes the possibility of acutely decompensated heart failure as consequence of worsening of chronic anemia, as further detailed below.  Additionally, he had several valvular pathologies identified on most recent echocardiogram, the progression of which would also predispose him to acutely decompensated heart failure.  We will pursue updated echocardiogram in the morning to further assess the above.  It appears that he is an uric at baseline, not on any diuretic medications at home.   Plan: monitor strict I's & O's and daily weights. Monitor on telemetr. Monitor continuous pulse oximetry. Repeat CMP in the morning, and Check serum magnesium level.  Close monitoring ensuing blood pressure,  with potential room for improvement in terms of afterload reduction given initially hypertensive blood pressures noted at [facility] ED. echocardiogram in the morning.  Repeat EKG in the morning.  Further evaluation management of intermittent chest pain as well as mildly elevated troponin, as further detailed below.  Cardiology consult requested for the morning.  Further evaluation and management of subacute anemia superimposed on anemia of CKD.  Check BNP.  Will warrant nephrology consultation in the morning for nonemergent hemodialysis.           #) Chest pain: New onset episodes of exertional left-sided chest discomfort with radiation into the left shoulder starting 1 month ago, but in the absence of overt increase in frequency, intensity, or duration of these episodes over that time.  This history concerning for unstable angina given these episodes of exertional chest discomfort improving with rest or new to the patient over the course the last month.  Mildly elevated troponin, as further described below.  He was started on heparin drip at [facility] ED this evening.  Presentation does not appear to be associated with STEMI, although there are some nonspecific T wave changes identified in the lateral leads on today's EKG.  Differential also includes the possibility of acute pulmonary embolism given onset of episodes of chest discomfort, with some pleuritic exacerbation, starting after recent surgical procedure to the right eye, as further detailed above.  Currently chest pain-free.  Status post baby aspirin administered at [facility] earlier today.  Plan: Continue heparin drip.  Echocardiogram in the morning.  Monitor on telemetry.  Check serum magnesium level.  Cardiology consultation in the morning, as above.  As needed sublingual nitroglycerin. Will also repeat EKG in the morning to further trend T wave changes in the lateral leads.  Check lipase.  Protonix 40 mg IV daily given reported history  of GERD.           #)  Troponin elevation: Compared to most recent prior troponin level of 65 in February 2023, troponin I is at [facility] earlier today noted to be mildly elevated, as further quantified above.  However, third set of troponin at [facility] started to trend down.  Consequently I do not think that I can attribute fatality of his troponin elevation on the basis of diminished renal clearance from end-stage renal disease, given that the troponin began to improve prior to undergoing hemodialysis.  We will refrain from additional troponin trending for now given that the last value was trending down.  But will pursue echocardiogram in the morning and engage in further evaluation/management of presenting intermittent chest pain along with presenting acute on chronic diastolic heart failure, as above, noting that patient's troponin elevation may be supply/demand mismatch as a consequence of his presenting acute on chronic heart failure. Ddx includes acute pe versus supply/demand mismatch from relative decline in oxygen delivery capacity in the setting of subacute anemia superimposed on anemia of chronic kidney disease, as further detailed below.   Plan: Monitor on symmetry.  Echocardiogram in the morning.  Cardiology consult requested for the morning, as above.  Check serum magnesium level.  Repeat EKG in the morning.  Further evaluation and management of subacute anemia superimposed on anemia of CKD, as detailed below.  Continue outpatient beta-blocker, losartan.  Continue home high intensity atorvastatin.             #) QTc prolongation: Presenting EKG demonstrates QTc of 514 ms. no overt contributory outpatient medications.    Plan: Monitor on telemetry.  Add-on serum magnesium level.  Repeat EKG in the morning to monitor interval degree of QTc prolongation.            #) Subacute anemia superimposed on anemia of CKD: In the context of documented history of anemia of  chronic kidney disease associated baseline hemoglobin range 11-13, it appears that his hemoglobin has been trending down over at least the last few weeks, noting value of 10.3 on 06/25/2022, with further interval decline to 10 this evening.  Potential component of hemodilution given presenting acute on chronic diastolic heart failure.  No overt evidence of active bleed.  On daily baby aspirin as outpatient, otherwise on no blood thinners.  Plan: Add on iron studies, reticulocyte count.  Check INR.  Type and screen.  Repeat CBC in the morning.             #) ESRD: on HD (schedule: Monday, Wednesday, Friday).  Compliant in attending all of his scheduled HD sessions. next due for routine HD on Monday, 07/05/2022. No clinical evidence for urgent overnight HD.  He does have evidence of acute on chronic diastolic heart failure, with radiographic findings as above.  However, he does not appear to be in respiratory distress, and is maintaining oxygen saturations in the high 90s on room air.   Plan: monitor strict I's/O's, daily weights. CMP in the AM. Check mag and phos levels.  Anticipate nephrology consultation in the morning for nonurgent hemodialysis, as above.            #) Essential Hypertension: documented h/o such, with outpatient antihypertensive regimen including amlodipine, losartan, Coreg.  SBP's in the ED today: Initially elevated into the 170s to 180s, but it improved into the 130s to 140s following resumption of home antihypertensive medications.   Plan: Close monitoring of subsequent BP via routine VS. continue antihypertensive medications, as above.          #) GERD:  documented h/o such; on omeprazole as outpatient.   Plan: In the context of presenting intermittent episodes of chest pain, will transiently convert home oral PPI to daily IV Protonix while in the hospital.       DVT prophylaxis: On heparin drip, as above Code Status: Full code Family  Communication: none Disposition Plan: Per Rounding Team Consults called: Formal cardiology consult requested for the morning of 07/05/2022, as further detailed above;  Admission status: Inpatient    PLEASE NOTE THAT DRAGON DICTATION SOFTWARE WAS USED IN THE CONSTRUCTION OF THIS NOTE.   Renick DO [facility]  From [facility]   07/05/2022, 1:40 AM

## 2022-07-05 NOTE — Plan of Care (Signed)
  Problem: Coping: Goal: Level of anxiety will decrease Outcome: Progressing   Problem: Pain Managment: Goal: General experience of comfort will improve Outcome: Progressing   Problem: Safety: Goal: Ability to remain free from injury will improve Outcome: Progressing   Problem: Skin Integrity: Goal: Risk for impaired skin integrity will decrease Outcome: Progressing   

## 2022-07-05 NOTE — Progress Notes (Addendum)
Received patient in bed to unit.  Alert and oriented.  Informed consent signed and in chart.   Treatment initiated: 1237 Treatment completed: 1539  Patient tolerated well.  Transported back to the room  Alert, without acute distress.  Hand-off given to patient's nurse.   Access used: Fistula Access issues: n/a  Total UF removed: 2.1L Medication(s) given: n/a Post HD weight: 71.5kg    07/05/22 1543  Vitals  Temp 97.6 F (36.4 C)  Temp Source Oral  BP 125/62  MAP (mmHg) 81  BP Location Left Arm  BP Method Automatic  Patient Position (if appropriate) Lying  Pulse Rate 64  Pulse Rate Source Monitor  ECG Heart Rate 65  Resp 16  Oxygen Therapy  SpO2 94 %  O2 Device Room Air  During Treatment Monitoring  Intra-Hemodialysis Comments Tolerated well;Tx completed  Post Treatment  Dialyzer Clearance Lightly streaked  Duration of HD Treatment -hour(s) 2.29 hour(s)  Hemodialysis Intake (mL) 0 mL  Liters Processed 54.2  Fluid Removed 2100 mL  Tolerated HD Treatment Yes  Post-Hemodialysis Comments Pt ran 2 hours and 29 minutes, Pt signed off AMA, Uf removed 2.1L,  AVG/AVF Arterial Site Held (minutes) 8 minutes  AVG/AVF Venous Site Held (minutes) 8 minutes      [facility]

## 2022-07-06 ENCOUNTER — Other Ambulatory Visit: Payer: Self-pay | Admitting: Cardiology

## 2022-07-06 ENCOUNTER — Encounter (HOSPITAL_COMMUNITY)
Admission: EM | Disposition: A | Discharge status: Home / Self Care | Payer: Self-pay | Source: Home / Self Care | Attending: Internal Medicine

## 2022-07-06 ENCOUNTER — Encounter (HOSPITAL_COMMUNITY): Payer: Self-pay | Admitting: Cardiology

## 2022-07-06 ENCOUNTER — Inpatient Hospital Stay (HOSPITAL_COMMUNITY): Payer: Medicare Other

## 2022-07-06 DIAGNOSIS — I2583 Coronary atherosclerosis due to lipid rich plaque: Secondary | ICD-10-CM

## 2022-07-06 DIAGNOSIS — I214 Non-ST elevation (NSTEMI) myocardial infarction: Secondary | ICD-10-CM

## 2022-07-06 DIAGNOSIS — I2510 Atherosclerotic heart disease of native coronary artery without angina pectoris: Secondary | ICD-10-CM

## 2022-07-06 DIAGNOSIS — E785 Hyperlipidemia, unspecified: Secondary | ICD-10-CM

## 2022-07-06 HISTORY — PX: RIGHT/LEFT HEART CATH AND CORONARY ANGIOGRAPHY: CATH118266

## 2022-07-06 LAB — POCT I-STAT EG7
Acid-Base Excess: 4 mmol/L — ABNORMAL HIGH (ref 0.0–2.0)
Acid-Base Excess: 6 mmol/L — ABNORMAL HIGH (ref 0.0–2.0)
Bicarbonate: 28.7 mmol/L — ABNORMAL HIGH (ref 20.0–28.0)
Bicarbonate: 30.6 mmol/L — ABNORMAL HIGH (ref 20.0–28.0)
Calcium, Ion: 0.99 mmol/L — ABNORMAL LOW (ref 1.15–1.40)
Calcium, Ion: 1 mmol/L — ABNORMAL LOW (ref 1.15–1.40)
HCT: 25 % — ABNORMAL LOW (ref 39.0–52.0)
HCT: 25 % — ABNORMAL LOW (ref 39.0–52.0)
Hemoglobin: 8.5 g/dL — ABNORMAL LOW (ref 13.0–17.0)
Hemoglobin: 8.5 g/dL — ABNORMAL LOW (ref 13.0–17.0)
O2 Saturation: 73 %
O2 Saturation: 75 %
Potassium: 3.9 mmol/L (ref 3.5–5.1)
Potassium: 3.9 mmol/L (ref 3.5–5.1)
Sodium: 137 mmol/L (ref 135–145)
Sodium: 137 mmol/L (ref 135–145)
TCO2: 30 mmol/L (ref 22–32)
TCO2: 32 mmol/L (ref 22–32)
pCO2, Ven: 40.9 mmHg — ABNORMAL LOW (ref 44–60)
pCO2, Ven: 43.3 mmHg — ABNORMAL LOW (ref 44–60)
pH, Ven: 7.454 — ABNORMAL HIGH (ref 7.25–7.43)
pH, Ven: 7.457 — ABNORMAL HIGH (ref 7.25–7.43)
pO2, Ven: 37 mmHg (ref 32–45)
pO2, Ven: 38 mmHg (ref 32–45)

## 2022-07-06 LAB — POCT I-STAT 7, (LYTES, BLD GAS, ICA,H+H)
Acid-Base Excess: 6 mmol/L — ABNORMAL HIGH (ref 0.0–2.0)
Bicarbonate: 29.9 mmol/L — ABNORMAL HIGH (ref 20.0–28.0)
Calcium, Ion: 1.05 mmol/L — ABNORMAL LOW (ref 1.15–1.40)
HCT: 26 % — ABNORMAL LOW (ref 39.0–52.0)
Hemoglobin: 8.8 g/dL — ABNORMAL LOW (ref 13.0–17.0)
O2 Saturation: 99 %
Potassium: 4 mmol/L (ref 3.5–5.1)
Sodium: 134 mmol/L — ABNORMAL LOW (ref 135–145)
TCO2: 31 mmol/L (ref 22–32)
pCO2 arterial: 40.6 mmHg (ref 32–48)
pH, Arterial: 7.475 — ABNORMAL HIGH (ref 7.35–7.45)
pO2, Arterial: 131 mmHg — ABNORMAL HIGH (ref 83–108)

## 2022-07-06 LAB — ECHOCARDIOGRAM COMPLETE
AR max vel: 1.13 cm2
AV Area VTI: 1.11 cm2
AV Area mean vel: 1.16 cm2
AV Mean grad: 15 mmHg
AV Peak grad: 27.2 mmHg
Ao pk vel: 2.61 m/s
Area-P 1/2: 3.6 cm2
Calc EF: 47.4 %
Height: 70 in
P 1/2 time: 460 msec
S' Lateral: 3.9 cm
Single Plane A2C EF: 46.6 %
Single Plane A4C EF: 46 %
Weight: 2469.15 oz

## 2022-07-06 LAB — CBC
HCT: 28 % — ABNORMAL LOW (ref 39.0–52.0)
Hemoglobin: 8.9 g/dL — ABNORMAL LOW (ref 13.0–17.0)
MCH: 30.3 pg (ref 26.0–34.0)
MCHC: 31.8 g/dL (ref 30.0–36.0)
MCV: 95.2 fL (ref 80.0–100.0)
Platelets: 282 10*3/uL (ref 150–400)
RBC: 2.94 MIL/uL — ABNORMAL LOW (ref 4.22–5.81)
RDW: 14.9 % (ref 11.5–15.5)
WBC: 8.9 10*3/uL (ref 4.0–10.5)
nRBC: 0 % (ref 0.0–0.2)

## 2022-07-06 LAB — HEPARIN LEVEL (UNFRACTIONATED): Heparin Unfractionated: <0.1 IU/mL — ABNORMAL LOW (ref 0.30–0.70)

## 2022-07-06 LAB — POCT ACTIVATED CLOTTING TIME: Activated Clotting Time: 131 seconds

## 2022-07-06 LAB — GLUCOSE, CAPILLARY: Glucose-Capillary: 91 mg/dL (ref 70–99)

## 2022-07-06 SURGERY — RIGHT/LEFT HEART CATH AND CORONARY ANGIOGRAPHY
Anesthesia: LOCAL

## 2022-07-06 MED ORDER — LIDOCAINE HCL (PF) 1 % IJ SOLN
INTRAMUSCULAR | Status: DC | PRN
  Administered 2022-07-06: 5 mL

## 2022-07-06 MED ORDER — HEPARIN (PORCINE) IN NACL 1000-0.9 UT/500ML-% IV SOLN
INTRAVENOUS | Status: AC
  Filled 2022-07-06: qty 500

## 2022-07-06 MED ORDER — SODIUM CHLORIDE 0.9 % IV SOLN: 250.0000 mL | INTRAVENOUS | Status: DC | PRN

## 2022-07-06 MED ORDER — PANTOPRAZOLE SODIUM 40 MG PO TBEC
40.0000 mg | DELAYED_RELEASE_TABLET | Freq: Every day | ORAL | Status: DC
  Administered 2022-07-06 – 2022-07-08 (×3): 40 mg via ORAL
  Filled 2022-07-06 (×3): qty 1

## 2022-07-06 MED ORDER — LIDOCAINE HCL (PF) 1 % IJ SOLN
INTRAMUSCULAR | Status: AC
  Filled 2022-07-06: qty 30

## 2022-07-06 MED ORDER — HEPARIN SODIUM (PORCINE) 5000 UNIT/ML IJ SOLN
5000.0000 [IU] | Freq: Three times a day (TID) | INTRAMUSCULAR | Status: DC
  Administered 2022-07-06 – 2022-07-08 (×4): 5000 [IU] via SUBCUTANEOUS
  Filled 2022-07-06 (×4): qty 1

## 2022-07-06 MED ORDER — CHLORHEXIDINE GLUCONATE CLOTH 2 % EX PADS
6.0000 | MEDICATED_PAD | Freq: Every day | CUTANEOUS | Status: DC
  Administered 2022-07-07 – 2022-07-08 (×2): 6 via TOPICAL

## 2022-07-06 MED ORDER — HYDRALAZINE HCL 20 MG/ML IJ SOLN: 10.0000 mg | INTRAMUSCULAR | Status: AC | PRN

## 2022-07-06 MED ORDER — HEPARIN (PORCINE) IN NACL 1000-0.9 UT/500ML-% IV SOLN
INTRAVENOUS | Status: DC | PRN
  Administered 2022-07-06 (×2): 500 mL

## 2022-07-06 MED ORDER — LABETALOL HCL 5 MG/ML IV SOLN: 10.0000 mg | INTRAVENOUS | Status: AC | PRN

## 2022-07-06 MED ORDER — SODIUM CHLORIDE 0.9% FLUSH
3.0000 mL | Freq: Two times a day (BID) | INTRAVENOUS | Status: DC
  Administered 2022-07-06 – 2022-07-07 (×4): 3 mL via INTRAVENOUS

## 2022-07-06 MED ORDER — SODIUM CHLORIDE 0.9% FLUSH
3.0000 mL | INTRAVENOUS | Status: DC | PRN
  Administered 2022-07-07: 3 mL via INTRAVENOUS

## 2022-07-06 MED ORDER — IOHEXOL 350 MG/ML SOLN
INTRAVENOUS | Status: DC | PRN
  Administered 2022-07-06: 55 mL

## 2022-07-06 SURGICAL SUPPLY — 11 items
CATH INFINITI 5FR MULTPACK ANG (CATHETERS) IMPLANT
CATH SWAN GANZ 7F STRAIGHT (CATHETERS) IMPLANT
KIT HEART LEFT (KITS) ×2 IMPLANT
MAT PREVALON FULL STRYKER (MISCELLANEOUS) IMPLANT
PACK CARDIAC CATHETERIZATION (CUSTOM PROCEDURE TRAY) ×2 IMPLANT
SHEATH PINNACLE 5F 10CM (SHEATH) IMPLANT
SHEATH PINNACLE 7F 10CM (SHEATH) IMPLANT
SHEATH PROBE COVER 6X72 (BAG) IMPLANT
TRANSDUCER W/STOPCOCK (MISCELLANEOUS) ×2 IMPLANT
TUBING CIL FLEX 10 FLL-RA (TUBING) ×2 IMPLANT
WIRE EMERALD 3MM-J .035X150CM (WIRE) IMPLANT

## 2022-07-06 NOTE — Assessment & Plan Note (Signed)
Cardiac catheterization with 2 vessel disease Patient with no chest pain today  Plan to continue antiplatelet therapy with aspirin and statin Continue blood pressure control and volume control with HD.

## 2022-07-06 NOTE — Progress Notes (Signed)
Progress Note   Patient: Travis [name] EXH:[id number] DOB: Oct 25, 1940 DOA: 07/04/2022     2 DOS: the patient was seen and examined on 07/06/2022   Brief hospital course: Travis [name] was admitted to the hospital with the working diagnosis of decompensated heart failure.   81 yo male with the past medical history of ERSD, pulmonary hypertension, DVT  and chronic anemia who presented with chest pain. Reported one month of intermittent chest pain, left sided and radiated to the left shoulder, at rest and worse with exertion. 4 to 5 days of dyspnea and orthopnea. On his initial physical examination his blood pressure was 129/68, HR 73, RR 24 and 02 saturation 92%, lungs with no wheezing or rhonchi, heart with S1 and S2 present and rhythmic, abdomen with no distention and no lower extremity edema.   Na 136, K 3,8 Cl 92 bicarbonate 30 glucose 110 bun 27 cr 9,4 High sensitive troponin 130  Wbc 10,1 hgb 10,0 plt 309   Chest radiograph with no cardiomegaly, bilateral hilar vascular congestion, with bilateral interstitial infiltrates, more at the lower lobes and cephalization of the vasculature.   EKG 85 bpm, normal axis, qtc 504, sinus rhythm with J point elevation V1 to V3 with poor R R progression, ST depression in V5 and V6 and T wave inversion lead I and AvL.   Patient underwent HD with improvement in his symptoms.   10/24 cardiac catheterization with moderate severe 2 vessel single vessel disease. PCWP 22.  Recommended continue medical therapy and fluid management per HD.        Assessment and Plan: * Acute on chronic diastolic CHF (congestive heart failure) (HCC) Echocardiogram with preserved 60 to 65%, mild LVH, RV systolic preserved, mild to moderate TR.  His volume status has improved with hemodialysis and ultrafiltration Continue blood pressure monitoring.  Continue with amlodipine, carvedilol, and losartan.   End stage renal disease on dialysis [facility]) Patient had HD with  good toleration, improved symptoms of volume overload.  Plan to continue HD per nephrology recommendations, his home schedule is Monday, Wednesday and Friday.   Anemia of chronic renal disease, hgb has been stable.  Continue with EPO.   Metabolic bone disease continue with calcitriol renvela and snesipar  GERD (gastroesophageal reflux disease) Continue with pantoprazole, change to po.  Advance diet post cardiac catheterization   HTN (hypertension) Continue blood pressure control with amlodipine, carvedilol and losartan   Coronary artery disease Cardiac catheterization with 2 vessel disease Patient with no chest pain today  Plan to continue antiplatelet therapy with aspirin and statin Continue blood pressure control and volume control with HD.         Subjective: patient with no chest pain or dyspnea   Physical Exam: Vitals:   07/06/22 1202 07/06/22 1207 07/06/22 1212 07/06/22 1359  BP: (!) 155/76 (!) 148/81 (!) 166/82 (!) 151/72  Pulse: 65 69 74   Resp: 18 18 (!) 24 18  Temp:      TempSrc:      SpO2: 100% 100% 100%   Weight:      Height:       Neurology awake and alert ENT with mild pallor Cardiovascular with S1 and S2 present and rhythmic with no gallops or rubs Respiratory with no rales or wheezing Abdomen with no distention  No lower extremity edema  Data Reviewed:    Family Communication: I spoke with patient's wife at the bedside, we talked in detail about patient's condition, plan of care and prognosis and  all questions were addressed.   Disposition: Status is: Inpatient Remains inpatient appropriate because: volume overload   Planned Discharge Destination: Home possible dc home tomorrow after HD       Author: Tawni Millers, MD 07/06/2022 2:31 PM  For on call review [email or web address].

## 2022-07-06 NOTE — Progress Notes (Addendum)
[facility] Progress Note   Subjective:   Patient seen and examined at bedside with wife present.  Signed off dialysis early yesterday - 2.1L removed.  Restless and agitated overnight. Left and Right heart cath today. Breathing better.  Denies CP, SOB, abdominal pain, and n/v/d.  Objective Vitals:   07/06/22 1157 07/06/22 1202 07/06/22 1207 07/06/22 1212  BP: (!) 153/78 (!) 155/76 (!) 148/81 (!) 166/82  Pulse: 66 65 69 74  Resp: 18 18 18  (!) 24  Temp:      TempSrc:      SpO2: 100% 100% 100% 100%  Weight:      Height:       Physical Exam General:WDWN elderly male in NAD Heart:RRR, no mrg Lungs:CTAB, nml WOB on RA Abdomen:soft, NTND Extremities:no LE edema Dialysis Access:RU AVG +b/t  Filed Weights   07/05/22 0330 07/05/22 1400 07/05/22 1543  Weight: 71.1 kg 72 kg 70 kg    Intake/Output Summary (Last 24 hours) at 07/06/2022 1251 Last data filed at 07/06/2022 0651 Gross per 24 hour  Intake 1007.6 ml  Output 2100 ml  Net -1092.4 ml    Additional Objective Labs: Basic Metabolic Panel: Recent Labs  Lab 07/04/22 0755 07/05/22 0215  NA 136 136  K 3.8 4.6  CL 92* 94*  CO2 30 27  GLUCOSE 110* 96  BUN 27* 38*  CREATININE 9.44* 10.91*  CALCIUM 9.0 8.7*  PHOS  --  6.5*   Liver Function Tests: Recent Labs  Lab 07/05/22 0215  AST 14*  ALT 7  ALKPHOS 69  BILITOT 0.7  PROT 6.9  ALBUMIN 3.0*   Recent Labs  Lab 07/05/22 0215  LIPASE 36   CBC: Recent Labs  Lab 07/04/22 0755 07/05/22 0215 07/06/22 0311  WBC 10.1 9.9 8.9  HGB 10.0* 8.9* 8.9*  HCT 31.3* 27.6* 28.0*  MCV 96.0 93.9 95.2  PLT 309 260 282    CBG: Recent Labs  Lab 07/05/22 1635 07/06/22 0813  GLUCAP 13* 91   Iron Studies:  Recent Labs    07/05/22 1017  IRON 45  TIBC 196*  FERRITIN 1,001*   Lab Results  Component Value Date   INR 1.1 07/05/2022   INR 1.0 07/04/2022   INR 1.0 12/18/2021   Studies/Results: No results found.  Medications:  sodium chloride  10 mL/hr at 07/06/22 0651   [MAR Hold] albumin human     heparin 1,300 Units/hr (07/06/22 0527)    [MAR Hold] amLODipine  10 mg Oral Daily   [MAR Hold] aspirin  81 mg Oral Daily   [MAR Hold] atorvastatin  40 mg Oral q1800   [MAR Hold] calcitRIOL  2.75 mcg Oral Q M,W,F-HD   [MAR Hold] carvedilol  12.5 mg Oral BID WC   [MAR Hold] cinacalcet  90 mg Oral Q M,W,F-HD   [MAR Hold] darbepoetin (ARANESP) injection - DIALYSIS  40 mcg Intravenous Q Mon-HD   [MAR Hold] losartan  25 mg Oral QHS   [MAR Hold] pantoprazole (PROTONIX) IV  40 mg Intravenous Q24H   [MAR Hold] sevelamer carbonate  3,200 mg Oral TID WC    Dialysis Orders: MWF - [facility]  3.5hrs, BFR 400, DFR 1.5,  EDW 71.8, 2K/ 2Ca UFP 2   Access: RU AVG  Heparin none Mircera 30 mcg q2wks - last 10/4 Calcitriol 2.75 mcg PO qHD Sensipar 90mg  qHD     Assessment/Plan:  Pulmonary edema/Volume overload - interstitial edema noted on CXR.  Per weights close to dry.  Likely weight loss  recently and needs lowering. Net UF 2L with HD yesterday.  Elevated filling pressure on RHC, will continue to titrate down volume as tolerated.   Chest pain- RHC & LHC today. Moderate to severe 2 vessel single vessel disease with medical management recommended.  Not favorable for PCI.  Elevated filling pressures on RHC. Per cardiology.   ESRD -  on HD MWF. Signed off HD early yesterday.  Plan for HD tomorrow per regular schedule.  If discharged today can return to outpatient dialysis tomorrow AM.  Hypertension - BP elevated. Improved with HD but elevated again overnight.  Not on antihypertensives at home.  Takes midodrine pre HD for BP support. Cardiology started carvedilol 12.5mg  BID, amlodipine 10mg  qd and losartan 25mg  qd.  Anemia of CKD - Hgb 8.9 - Aranesp 62mcg given with HD yesterday.  Secondary Hyperparathyroidism -  Ca in goal. Phos elevated.  Continue binders, sensipar and calcitriol  Nutrition - Renal diet w/fluid restrictions Chronic diastolic HF - volume  management w/HD. Mostly preserved EF with elevated filling pressures on HC.   Jen Mow, PA-C [facility] 07/06/2022,12:51 PM  LOS: 2 days    Seen and examined independently.  Agree with note and exam as documented above by physician extender and as noted here.  General adult male in bed in no acute distress HEENT normocephalic atraumatic extraocular movements intact sclera anicteric Neck supple trachea midline Lungs clear to auscultation bilaterally normal work of breathing at rest  Heart regular rate and rhythm no rubs or gallops appreciated Abdomen soft nontender nondistended Extremities no edema  Psych normal mood and affect Neuro - conversant  Access RUE AVF bruit and thrill   Pulmonary edema - optimize volume with HD.  Note also with pulm fibrosis so decreased resp reserve     Chest pain - with medical management recommended after cath.  Per primary team and cardiology   ESRD - HD per MWF schedule    HTN - optimize volume status with HD    Anemia CKD - got aranesp 40 mcg on 10/23   Secondary hyperparathyroidism - continue outpatient regimen   Disposition per primary team.    Claudia Desanctis, MD 07/06/2022 2:19 PM

## 2022-07-06 NOTE — Hospital Course (Signed)
Travis [name] was admitted to the hospital with the working diagnosis of decompensated heart failure.   81 yo male with the past medical history of ERSD, pulmonary hypertension, DVT  and chronic anemia who presented with chest pain. Reported one month of intermittent chest pain, left sided and radiated to the left shoulder, at rest and worse with exertion. 4 to 5 days of dyspnea and orthopnea. On his initial physical examination his blood pressure was 129/68, HR 73, RR 24 and 02 saturation 92%, lungs with no wheezing or rhonchi, heart with S1 and S2 present and rhythmic, abdomen with no distention and no lower extremity edema.   Na 136, K 3,8 Cl 92 bicarbonate 30 glucose 110 bun 27 cr 9,4 High sensitive troponin 130  Wbc 10,1 hgb 10,0 plt 309   Chest radiograph with no cardiomegaly, bilateral hilar vascular congestion, with bilateral interstitial infiltrates, more at the lower lobes and cephalization of the vasculature.   EKG 85 bpm, normal axis, qtc 504, sinus rhythm with J point elevation V1 to V3 with poor R R progression, ST depression in V5 and V6 and T wave inversion lead I and AvL.   Patient underwent HD with improvement in his symptoms.   10/24 cardiac catheterization with moderate severe 2 vessel single vessel disease. PCWP 22.  Recommended continue medical therapy and fluid management per HD.

## 2022-07-06 NOTE — Progress Notes (Signed)
Rounding Note    Patient Name: Travis [name] Date of Encounter: 07/06/2022  [place] Cardiologist: Freada Bergeron, MD   Subjective   Daughter is at bedside. Patient denies chest pain overnight. Breathing is about the same, did improve slightly after dialysis yesterday. Patient is NPO for cath today   Inpatient Medications    Scheduled Meds:  amLODipine  10 mg Oral Daily   aspirin  81 mg Oral Daily   atorvastatin  40 mg Oral q1800   calcitRIOL  2.75 mcg Oral Q M,W,F-HD   carvedilol  12.5 mg Oral BID WC   cinacalcet  90 mg Oral Q M,W,F-HD   darbepoetin (ARANESP) injection - DIALYSIS  40 mcg Intravenous Q Mon-HD   losartan  25 mg Oral QHS   pantoprazole (PROTONIX) IV  40 mg Intravenous Q24H   sevelamer carbonate  3,200 mg Oral TID WC   Continuous Infusions:  sodium chloride     albumin human     heparin 1,300 Units/hr (07/06/22 0527)   PRN Meds: acetaminophen **OR** acetaminophen, albumin human, melatonin, nitroGLYCERIN, ondansetron (ZOFRAN) IV   Vital Signs    Vitals:   07/05/22 1530 07/05/22 1543 07/05/22 1639 07/05/22 2008  BP: (!) 92/54 125/62 (!) 164/79 (!) 155/86  Pulse: 67 64 71 85  Resp: (!) 21 16 19    Temp:  97.6 F (36.4 C) 97.6 F (36.4 C) 98 F (36.7 C)  TempSrc:  Oral Oral Oral  SpO2: 100% 94% 95%   Weight:  70 kg    Height:        Intake/Output Summary (Last 24 hours) at 07/06/2022 0645 Last data filed at 07/05/2022 1828 Gross per 24 hour  Intake 591.16 ml  Output 2100 ml  Net -1508.84 ml      07/05/2022    3:43 PM 07/05/2022    2:00 PM 07/05/2022    3:30 AM  Last 3 Weights  Weight (lbs) 154 lb 5.2 oz 158 lb 11.7 oz 156 lb 12.8 oz  Weight (kg) 70 kg 72 kg 71.124 kg      Telemetry    NSR - Personally Reviewed  ECG    No new tracings - Personally Reviewed  Physical Exam   GEN: No acute distress. Laying flat in the bed. Sleeping, but arouses easily to voice  Neck: No JVD Cardiac: RRR, no murmurs, rubs, or  gallops. Radial pulses 2+ bilaterally  Respiratory: Fine crackles in bilateral lung bases, otherwise clear to auscultation bilaterally  GI: Soft, nontender, non-distended  MS: No edema; No deformity. Neuro:  Nonfocal  Psych: Normal affect   Labs    High Sensitivity Troponin:   Recent Labs  Lab 07/04/22 0755 07/04/22 1136 07/04/22 1541 07/05/22 0215  TROPONINIHS 130* 137* 120* 107*     Chemistry Recent Labs  Lab 07/04/22 0755 07/05/22 0215  NA 136 136  K 3.8 4.6  CL 92* 94*  CO2 30 27  GLUCOSE 110* 96  BUN 27* 38*  CREATININE 9.44* 10.91*  CALCIUM 9.0 8.7*  MG  --  2.1  PROT  --  6.9  ALBUMIN  --  3.0*  AST  --  14*  ALT  --  7  ALKPHOS  --  69  BILITOT  --  0.7  GFRNONAA 5* 4*  ANIONGAP 14 15    Lipids No results for input(s): "CHOL", "TRIG", "HDL", "LABVLDL", "LDLCALC", "CHOLHDL" in the last 168 hours.  Hematology Recent Labs  Lab 07/04/22 5456 07/05/22 0215 07/06/22 2563  WBC 10.1 9.9 8.9  RBC 3.26* 2.94*  2.95* 2.94*  HGB 10.0* 8.9* 8.9*  HCT 31.3* 27.6* 28.0*  MCV 96.0 93.9 95.2  MCH 30.7 30.3 30.3  MCHC 31.9 32.2 31.8  RDW 14.8 14.9 14.9  PLT 309 260 282   Thyroid No results for input(s): "TSH", "FREET4" in the last 168 hours.  BNP Recent Labs  Lab 07/05/22 0301  BNP 2,472.8*    DDimer No results for input(s): "DDIMER" in the last 168 hours.   Radiology    CT Head Wo Contrast  Result Date: 07/04/2022 CLINICAL DATA:  Headaches EXAM: CT HEAD WITHOUT CONTRAST TECHNIQUE: Contiguous axial images were obtained from the base of the skull through the vertex without intravenous contrast. RADIATION DOSE REDUCTION: This exam was performed according to the departmental dose-optimization program which includes automated exposure control, adjustment of the mA and/or kV according to patient size and/or use of iterative reconstruction technique. COMPARISON:  Previous studies including the examination of 12/24/2021 FINDINGS: Brain: No acute intracranial  findings are seen. There are no signs of bleeding within the cranium. Cortical sulci are prominent. There is decreased density in periventricular and subcortical white matter. Vascular: Unremarkable. Skull: Unremarkable. Sinuses/Orbits: Visualized paranasal sinuses are unremarkable. There is a high density in the lateral and superior aspect of right optic globe suggesting previous intervention. Other: None. IMPRESSION: No acute intracranial findings are seen Atrophy.  Small-vessel disease. Electronically Signed   By: Elmer Picker M.D.   On: 07/04/2022 12:01   DG Chest 2 View  Result Date: 07/04/2022 CLINICAL DATA:  Chest pain EXAM: CHEST - 2 VIEW COMPARISON:  Prior chest x-ray 04/09/2022 FINDINGS: Progressive cardiomegaly. Atherosclerotic calcification present in the transverse aorta. Metal stent projects over the right innominate vein. Significantly increased diffuse interstitial airspace opacities bilaterally consistent with pulmonary edema. Small bilateral pleural effusions. Fluid is also entrapped within the major fissure on the lateral view. No pneumothorax. No acute osseous abnormality. IMPRESSION: 1. Progressive cardiomegaly, interstitial pulmonary edema and bilateral pleural effusions with fluid entrapped in the fissures. Findings may represent CHF or fluid overload in the setting of dialysis. Electronically Signed   By: Jacqulynn Cadet M.D.   On: 07/04/2022 08:37    Cardiac Studies   Echocardiogram Pending R/L Heart Cath today   Patient Profile     81 y.o. male with a hx of ESRD (on M,W,F hemodialysis schedule), chronic diastolic heart failure, pulmonary hypertension, GERD, DVT of RLE (s/p IVC filter, not on [place] 2/2 perinephric hematoma), anemia of chronic disease, COPD who is being seen 07/05/2022 for the evaluation of chest pain at the request of Dr. Velia Meyer  Assessment & Plan    Chest Pain  Stable Angina - Patient presented complaining of 1 month of stereotypical angina  symptoms - hsTn 130>>137>>120>107. While this may be demand ischemia in the setting of volume overload and ESRD, he has had episodic chest pain for a few years, now complains of exertional chest pain. CT chest in 2017 showed scattered coronary calcifications, likely these have worsened given ESRD  - Scheduled for LHC today. Patient NPO. Note-- patient has a right AV fistula, will likely need femoral approach  - Continue daily ASA, statin, carvedilol - on IV heparin prior to cath   Chronic Diastolic Heart Failure  - Most Recent Echo from 11/2020 showed EF 60-65%, no regional wall motion abnormalities, mild LVH with Grade II diastolic dysfunction, normal RV systolic function  - Echo this admission still pending-- Looks like it is scheduled for  8 AM today  - CXR on admission showed progressive cardiomegaly, interstitial pulmonary edema and bilateral pleural effusions with fluid entrapped in the fissures. BNP elevated to 2472.8 - Volume managed with dialysis-- patient did report improvement in SOB after dialysis yesterday  - R/L heart cath today will assess volume status   Hypertension  - Continue amlodipine 10 mg daily, carvedilol 12.5 mg BID, losartan 25 mg daily - Note-- patient does take midodrine pre HD for BP support  ESRD on HD  - Patient on a MWF dialysis schedule. Denies missing any recent dialysis appointments  - Nephrology following  - Underwent dialysis yesterday       For questions or updates, please contact [place] Please consult [email or web address] for contact info under        Signed, Margie Billet, PA-C  07/06/2022, 6:45 AM

## 2022-07-06 NOTE — Assessment & Plan Note (Addendum)
Patient had HD with good toleration, improved symptoms of volume overload.  Plan to continue HD per nephrology recommendations, his home schedule is Monday, Wednesday and Friday.   Anemia of chronic renal disease, hgb has been stable.  Continue with EPO.   Metabolic bone disease continue with calcitriol renvela and snesipar

## 2022-07-06 NOTE — Assessment & Plan Note (Signed)
Echocardiogram with preserved 60 to 65%, mild LVH, RV systolic preserved, mild to moderate TR.  His volume status has improved with hemodialysis and ultrafiltration Continue blood pressure monitoring.  Continue with amlodipine, carvedilol, and losartan.

## 2022-07-06 NOTE — Assessment & Plan Note (Signed)
Continue with pantoprazole, change to po.  Advance diet post cardiac catheterization

## 2022-07-06 NOTE — Progress Notes (Signed)
Mobility Specialist - Progress Note   07/06/22 1300  Mobility  Activity Contraindicated/medical hold   Pt currently on bed rest. Will follow up if time permits.   Larey Seat

## 2022-07-06 NOTE — Progress Notes (Signed)
07/06/22 1430  Clinical Encounter Type  Visited With Patient and family together;Health care provider  Visit Type Initial (Advance Directive Education)  Referral From Physician;Nurse Larkin Ina B. Howerter, DO; Lequita Halt, MD; Burnard Bunting, RN)  Consult/Referral To Chaplain Melvenia Beam)  Recommendations PATIENT DECLINED A.D.  Advance Directives (For Healthcare)  Does Patient Have a Medical Advance Directive? No  Would patient like information on creating a medical advance directive? Yes (Inpatient - patient defers creating a medical advance directive and declines information at this time)  [name]  Does Patient Have a Mental Health Advance Directive? No  Would patient like information on creating a mental health advance directive? Yes (Inpatient - patient defers creating a mental health advance directive and declines information at this time)   Chaplain responded to Spiritual Care Consultation request for Advance Directive education with Travis [name]. Met with Travis [name], along with his WIFE - Ms. Travis [name], [phone] at patient's bedside.   Travis [name] that he does NOT have a court appointed [place].  Travis [name] [name] that he IS Married.  Travis [name] [name] that his WIFE: Ms. Travis [name] [phone] will serve as his [place].    Chaplain provided the Advance Directive packet as well as education on Advance Directives-documents an individual completes to communicate their health care directions in advance of a time when they may need them. Chaplain informed Travis [name] the documents which may be completed here in the hospital are the Living Will and [place].   Chaplain informed patient that the [place] is a legal document in which an individual names another person, as their [place], to communicate his health care  decisions, when he, himself is not able to make them for himself. The Health Care Agent's function can be temporary or permanent depending on his ability to make and communicate those decisions independently.   Chaplain informed Travis [name] that in the absence of a [place], the state of [place] directs health care providers to look to the following individuals in the order listed: legal guardian; an attorney-in-fact under a general power of attorney (POA) if that POA includes the right to make health care decisions; person's spouse; a 27 of his children; a 86 of adult brothers and sisters; or an individual who has an established relationship with you, who is acting in good faith and who can convey your wishes.  If none of these persons are available or willing to make medical decisions on a patient's behalf, the law allows the patient's doctor to make decisions for them as long as another doctor agrees with those decisions.  Chaplain also informed the patient that the Health Care agent has no decision-making authority over any affairs other than those related to his medical care.   The chaplain further educated Travis [name] that a Living Will is a legal document that allows his desires not to receive life-prolonging measures in the event that they have a condition that is incurable and will result in his death in a short period of time; they are unconscious, and doctors are confident that they will not regain consciousness; and/or they have advanced dementia or other substantial and irreversible loss of mental function.   The chaplain informed Travis [name] that life-prolonging measures are medical treatments that would only serve to postpone death, including breathing machines, kidney dialysis, antibiotics,  artificial nutrition and hydration (tube feeding), and similar forms of treatment and that if an individual is able to express their wishes, they may also make them  known without the use of a Living Will, but in the event that he is not able to express his wishes, a Living Will allows medical providers and his family and friends to ensure that they are not making decisions on his behalf, but rather serving as his voice to convey the decisions that he has already made.   Travis [name] is aware that the decision to create an advance directive is his alone and he may choose not to complete the documents or may choose to complete one portion or both.  Travis [name] was informed that he can revoke the documents at any time by striking through them and writing void or by completing new documents, but that it is also advisable that the individual verbally notify interested parties that his wishes have changed.   Travis [name] is also aware that the document must be signed in the presence of a notary public and two witnesses and that this can be done while the patient is still admitted to the hospital or after discharge in the community. If they decide to complete Advance Directives after being discharged from the hospital, they have been advised to notify all interested parties and to provide those documents to their physicians and loved ones in addition to bringing them to the hospital in the event of another hospitalization.   [name] CHOOSE NOT TO COMPLETE ADVANCE DIRECTIVE AT THIS TIME.  Please page spiritual care if the patient desires further education or has questions.   [address] [place], [place] [phone].

## 2022-07-06 NOTE — Progress Notes (Signed)
Cardiac cath reviewed from today with results as follow:  Conclusion     Heavily calcified diffusely diseased vessel: 1st Diag-1 lesion is 99% stenosed. 1st Diag-2 lesion is 60% stenosed. 1st Diag-3 lesion is 95% stenosed.   The left ventricular systolic function is normal. The left ventricular ejection fraction is 50-55% by visual estimate.   LV end diastolic pressure is moderately elevated.   Hemodynamic findings consistent with mild pulmonary hypertension.   POSTOP DIAGNOSES Moderate-severe 2-vessel single-vessel disease: Proximal D1-99%, mid 60%, distal 95% (heavily calcified/diffusely diseased; not favorable for PCI would require atherectomy and a relatively small caliber vessel) &  60% ostial sidebranch of OM1 followed by a 60% mid vessel then distal occlusion (with right to left collaterals). Otherwise mild diffuse disease/calcified vessels Mostly preserved LVEF, cannot exclude anterolateral hypokinesis.   LVEDP moderately elevated at 19 mmHg (LVEDP 166/5 mmHg) with a PCWP of ~22 mmHg. RHC numbers:  Mean RAP 6 mmHg, RVP-EDP 51/0-5 mmHg;  PAP-mean 50/13-27 mmHg; PCWP 17-26 mmHg (~22 mmHg);  Ao sat 99%, PA sat 74%.   CO-CI: Fick -> 8.22, 4.44; thermal 5.37-2.7 (suspect that the discrepancy is related to AV fistula)     RECOMMENDATIONS Would recommend medical management for heavily diseased D1 branch.   This is heavily calcified vessel with diffuse disease, will require atherectomy and extensive stent placement in the vessel is roughly 2 to 2.25 mm distally.  Not favorable for PCI. Consider slightly increased volume removal and HD based on elevated filling pressures.   We will manage patient's CAD with medical therapy including amlodipine 10 mg daily, aspirin 81 mg daily and high-dose statin therapy.  His carvedilol was increased from 6.25 mg twice daily to 12.5 mg twice daily this admission.  If he continues to have anginal chest pain, options include increase beta-blocker therapy  further if heart rate and blood pressure allow versus addition of long-acting nitrate therapy.  As noted above filling pressures mildly elevated with PCW of 22 mmHg and LVEDP 19 mmHg and therefore likely would benefit from slightly lower dry weight with more fluid being pulled off at dialysis.  Will defer to nephrology.  Further input at this point.  We will sign off.  [facility] will sign off.   Medication Recommendations: Amlodipine 10 mg daily, aspirin 81 mg daily, atorvastatin 40 mg daily, carvedilol 12.5 mg twice daily, losartan 25 mg daily Other recommendations (labs, testing, etc): Fasting lipid panel  Follow up as an outpatient: Follow-up with Gwyndolyn Kaufman in 2 to 3 weeks.

## 2022-07-06 NOTE — Interval H&P Note (Signed)
History and Physical Interval Note:  07/06/2022 11:16 AM  Travis [name]  has presented today for surgery, with the diagnosis of Elevated Troponin - ? ACS.  The various methods of treatment have been discussed with the patient and family. After consideration of risks, benefits and other options for treatment, the patient has consented to  Procedure(s): RIGHT/LEFT HEART CATH AND CORONARY ANGIOGRAPHY (N/A)  PERCUTANEOUS CORONARY INTERVENTION  as a surgical intervention.  The patient's history has been reviewed, patient examined, no change in status, stable for surgery.  I have reviewed the patient's chart and labs.  Questions were answered to the patient's satisfaction.    Cath Lab Visit (complete for each Cath Lab visit)  Clinical Evaluation Leading to the Procedure:   ACS: Yes.    Non-ACS:    Anginal Classification: CCS III  Anti-ischemic medical therapy: Minimal Therapy (1 class of medications)  Non-Invasive Test Results: No non-invasive testing performed  Prior CABG: No previous CABG     Glenetta Hew

## 2022-07-06 NOTE — H&P (View-Only) (Signed)
Rounding Note    Patient Name: Travis [name] Date of Encounter: 07/06/2022  [place] Cardiologist: Freada Bergeron, MD   Subjective   Daughter is at bedside. Patient denies chest pain overnight. Breathing is about the same, did improve slightly after dialysis yesterday. Patient is NPO for cath today   Inpatient Medications    Scheduled Meds:  amLODipine  10 mg Oral Daily   aspirin  81 mg Oral Daily   atorvastatin  40 mg Oral q1800   calcitRIOL  2.75 mcg Oral Q M,W,F-HD   carvedilol  12.5 mg Oral BID WC   cinacalcet  90 mg Oral Q M,W,F-HD   darbepoetin (ARANESP) injection - DIALYSIS  40 mcg Intravenous Q Mon-HD   losartan  25 mg Oral QHS   pantoprazole (PROTONIX) IV  40 mg Intravenous Q24H   sevelamer carbonate  3,200 mg Oral TID WC   Continuous Infusions:  sodium chloride     albumin human     heparin 1,300 Units/hr (07/06/22 0527)   PRN Meds: acetaminophen **OR** acetaminophen, albumin human, melatonin, nitroGLYCERIN, ondansetron (ZOFRAN) IV   Vital Signs    Vitals:   07/05/22 1530 07/05/22 1543 07/05/22 1639 07/05/22 2008  BP: (!) 92/54 125/62 (!) 164/79 (!) 155/86  Pulse: 67 64 71 85  Resp: (!) 21 16 19    Temp:  97.6 F (36.4 C) 97.6 F (36.4 C) 98 F (36.7 C)  TempSrc:  Oral Oral Oral  SpO2: 100% 94% 95%   Weight:  70 kg    Height:        Intake/Output Summary (Last 24 hours) at 07/06/2022 0645 Last data filed at 07/05/2022 1828 Gross per 24 hour  Intake 591.16 ml  Output 2100 ml  Net -1508.84 ml      07/05/2022    3:43 PM 07/05/2022    2:00 PM 07/05/2022    3:30 AM  Last 3 Weights  Weight (lbs) 154 lb 5.2 oz 158 lb 11.7 oz 156 lb 12.8 oz  Weight (kg) 70 kg 72 kg 71.124 kg      Telemetry    NSR - Personally Reviewed  ECG    No new tracings - Personally Reviewed  Physical Exam   GEN: No acute distress. Laying flat in the bed. Sleeping, but arouses easily to voice  Neck: No JVD Cardiac: RRR, no murmurs, rubs, or  gallops. Radial pulses 2+ bilaterally  Respiratory: Fine crackles in bilateral lung bases, otherwise clear to auscultation bilaterally  GI: Soft, nontender, non-distended  MS: No edema; No deformity. Neuro:  Nonfocal  Psych: Normal affect   Labs    High Sensitivity Troponin:   Recent Labs  Lab 07/04/22 0755 07/04/22 1136 07/04/22 1541 07/05/22 0215  TROPONINIHS 130* 137* 120* 107*     Chemistry Recent Labs  Lab 07/04/22 0755 07/05/22 0215  NA 136 136  K 3.8 4.6  CL 92* 94*  CO2 30 27  GLUCOSE 110* 96  BUN 27* 38*  CREATININE 9.44* 10.91*  CALCIUM 9.0 8.7*  MG  --  2.1  PROT  --  6.9  ALBUMIN  --  3.0*  AST  --  14*  ALT  --  7  ALKPHOS  --  69  BILITOT  --  0.7  GFRNONAA 5* 4*  ANIONGAP 14 15    Lipids No results for input(s): "CHOL", "TRIG", "HDL", "LABVLDL", "LDLCALC", "CHOLHDL" in the last 168 hours.  Hematology Recent Labs  Lab 07/04/22 7353 07/05/22 0215 07/06/22 2992  WBC 10.1 9.9 8.9  RBC 3.26* 2.94*  2.95* 2.94*  HGB 10.0* 8.9* 8.9*  HCT 31.3* 27.6* 28.0*  MCV 96.0 93.9 95.2  MCH 30.7 30.3 30.3  MCHC 31.9 32.2 31.8  RDW 14.8 14.9 14.9  PLT 309 260 282   Thyroid No results for input(s): "TSH", "FREET4" in the last 168 hours.  BNP Recent Labs  Lab 07/05/22 0301  BNP 2,472.8*    DDimer No results for input(s): "DDIMER" in the last 168 hours.   Radiology    CT Head Wo Contrast  Result Date: 07/04/2022 CLINICAL DATA:  Headaches EXAM: CT HEAD WITHOUT CONTRAST TECHNIQUE: Contiguous axial images were obtained from the base of the skull through the vertex without intravenous contrast. RADIATION DOSE REDUCTION: This exam was performed according to the departmental dose-optimization program which includes automated exposure control, adjustment of the mA and/or kV according to patient size and/or use of iterative reconstruction technique. COMPARISON:  Previous studies including the examination of 12/24/2021 FINDINGS: Brain: No acute intracranial  findings are seen. There are no signs of bleeding within the cranium. Cortical sulci are prominent. There is decreased density in periventricular and subcortical white matter. Vascular: Unremarkable. Skull: Unremarkable. Sinuses/Orbits: Visualized paranasal sinuses are unremarkable. There is a high density in the lateral and superior aspect of right optic globe suggesting previous intervention. Other: None. IMPRESSION: No acute intracranial findings are seen Atrophy.  Small-vessel disease. Electronically Signed   By: Elmer Picker M.D.   On: 07/04/2022 12:01   DG Chest 2 View  Result Date: 07/04/2022 CLINICAL DATA:  Chest pain EXAM: CHEST - 2 VIEW COMPARISON:  Prior chest x-ray 04/09/2022 FINDINGS: Progressive cardiomegaly. Atherosclerotic calcification present in the transverse aorta. Metal stent projects over the right innominate vein. Significantly increased diffuse interstitial airspace opacities bilaterally consistent with pulmonary edema. Small bilateral pleural effusions. Fluid is also entrapped within the major fissure on the lateral view. No pneumothorax. No acute osseous abnormality. IMPRESSION: 1. Progressive cardiomegaly, interstitial pulmonary edema and bilateral pleural effusions with fluid entrapped in the fissures. Findings may represent CHF or fluid overload in the setting of dialysis. Electronically Signed   By: Jacqulynn Cadet M.D.   On: 07/04/2022 08:37    Cardiac Studies   Echocardiogram Pending R/L Heart Cath today   Patient Profile     81 y.o. male with a hx of ESRD (on M,W,F hemodialysis schedule), chronic diastolic heart failure, pulmonary hypertension, GERD, DVT of RLE (s/p IVC filter, not on [place] 2/2 perinephric hematoma), anemia of chronic disease, COPD who is being seen 07/05/2022 for the evaluation of chest pain at the request of Dr. Velia Meyer  Assessment & Plan    Chest Pain  Stable Angina - Patient presented complaining of 1 month of stereotypical angina  symptoms - hsTn 130>>137>>120>107. While this may be demand ischemia in the setting of volume overload and ESRD, he has had episodic chest pain for a few years, now complains of exertional chest pain. CT chest in 2017 showed scattered coronary calcifications, likely these have worsened given ESRD  - Scheduled for LHC today. Patient NPO. Note-- patient has a right AV fistula, will likely need femoral approach  - Continue daily ASA, statin, carvedilol - on IV heparin prior to cath   Chronic Diastolic Heart Failure  - Most Recent Echo from 11/2020 showed EF 60-65%, no regional wall motion abnormalities, mild LVH with Grade II diastolic dysfunction, normal RV systolic function  - Echo this admission still pending-- Looks like it is scheduled for  8 AM today  - CXR on admission showed progressive cardiomegaly, interstitial pulmonary edema and bilateral pleural effusions with fluid entrapped in the fissures. BNP elevated to 2472.8 - Volume managed with dialysis-- patient did report improvement in SOB after dialysis yesterday  - R/L heart cath today will assess volume status   Hypertension  - Continue amlodipine 10 mg daily, carvedilol 12.5 mg BID, losartan 25 mg daily - Note-- patient does take midodrine pre HD for BP support  ESRD on HD  - Patient on a MWF dialysis schedule. Denies missing any recent dialysis appointments  - Nephrology following  - Underwent dialysis yesterday       For questions or updates, please contact [place] Please consult [email or web address] for contact info under        Signed, Margie Billet, PA-C  07/06/2022, 6:45 AM

## 2022-07-06 NOTE — Assessment & Plan Note (Signed)
Continue blood pressure control with amlodipine, carvedilol and losartan

## 2022-07-06 NOTE — Progress Notes (Signed)
ANTICOAGULATION CONSULT NOTE   Pharmacy Consult for Heparin Indication: chest pain/ACS  No Active Allergies  Patient Measurements: Height: 5' 10"  (177.8 cm) Weight: 70 kg (154 lb 5.2 oz) IBW/kg (Calculated) : 73 Heparin Dosing Weight: 73.5 kg  Vital Signs: Temp: 98 F (36.7 C) (10/23 2008) Temp Source: Oral (10/23 2008) BP: 155/86 (10/23 2008) Pulse Rate: 85 (10/23 2008)  Labs: Recent Labs    07/04/22 0755 07/04/22 1136 07/04/22 1541 07/04/22 2024 07/05/22 0215 07/05/22 1017 07/05/22 2009 07/06/22 0311  HGB 10.0*  --   --   --  8.9*  --   --  8.9*  HCT 31.3*  --   --   --  27.6*  --   --  28.0*  PLT 309  --   --   --  260  --   --  282  LABPROT  --  13.1  --   --  13.9  --   --   --   INR  --  1.0  --   --  1.1  --   --   --   HEPARINUNFRC  --   --   --    < > <0.10* 0.28* 0.34 <0.10*  CREATININE 9.44*  --   --   --  10.91*  --   --   --   TROPONINIHS 130* 137* 120*  --  107*  --   --   --    < > = values in this interval not displayed.     Estimated Creatinine Clearance: 5.3 mL/min (A) (by C-G formula based on SCr of 10.91 mg/dL (H)).   Medical History: Past Medical History:  Diagnosis Date   Acute on chronic respiratory failure with hypoxia ([place]) 01/28/2015   Anemia    Arthritis    Chest pain    Chronic combined systolic and diastolic CHF (congestive heart failure) ([place])    a. Etiology of low EF not defined.   Chronic respiratory failure ([facility])    DVT (deep venous thrombosis) ([place])    a. 01/2015: RLE DVT. VQ scan intermediate probability. Underwent renal bx complicated by perinephric hematoma; anticoagulation stopped and IVC filter placed.   ESRD on hemodialysis ([place]) 02/2015   a. 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 Received plasma exchange and steroids but didn't respond and ended up starting hemodialysis June 2016.    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 04/2015 in HD in setting of BP 50s.   Unintentional weight loss    Wears partial dentures     Medications:  Medications Prior to Admission  Medication Sig Dispense Refill Last Dose   amLODipine (NORVASC) 10 MG  tablet Take 1 tablet (10 mg total) by mouth daily. 30 tablet 0 07/05/2022   aspirin EC 81 MG EC tablet Take 1 tablet (81 mg total) by mouth daily. 30 tablet 0 07/05/2022   atorvastatin (LIPITOR) 40 MG tablet Take 1 tablet (40 mg total) by mouth daily at 6 PM. 30 tablet 0 07/05/2022   atropine 1 % ophthalmic solution SMARTSIG:In Eye(s)   unk   Brinzolamide-Brimonidine 1-0.2 % SUSP Place 1 drop into both eyes in the morning, at noon, and at bedtime.   unk   calcitRIOL (ROCALTROL) 0.25 MCG capsule Take 13 capsules (3.25 mcg total) by mouth every Monday, Wednesday, and Friday with hemodialysis.   unk   carvedilol (COREG) 6.25 MG tablet Take 1 tablet (6.25 mg total) by mouth 2 (two) times daily with a meal. 60 tablet 1 07/05/2022 at 1100   cinacalcet (SENSIPAR) 30 MG tablet Take 3 tablets (90 mg total) by mouth every Monday, Wednesday, and Friday with hemodialysis. 60 tablet  unk   ferric gluconate 62.5 mg in sodium chloride 0.9 % 100 mL Inject 62.5 mg into the vein every Wednesday with hemodialysis.   unk   ibuprofen (ADVIL) 200 MG tablet Take  200 mg by mouth every 6 (six) hours as needed for mild pain or moderate pain.   07/04/2022   latanoprost (XALATAN) 0.005 % ophthalmic solution Place 1 drop into both eyes at bedtime.   unk   ofloxacin (OCUFLOX) 0.3 % ophthalmic solution SMARTSIG:In Eye(s)   unk   omeprazole (PRILOSEC) 40 MG capsule Take 40 mg by mouth daily.  11 07/05/2022   prednisoLONE acetate (PRED FORTE) 1 % ophthalmic suspension SMARTSIG:1 Drop(s) In Eye(s) 6 Times Daily   unk   sevelamer carbonate (RENVELA) 800 MG tablet Take 2 tablets (1,600 mg total) by mouth 3 (three) times daily with meals. 90 tablet 0 unk   sevelamer carbonate (RENVELA) 800 MG tablet SMARTSIG:4 Tablet(s) By Mouth 3 Times Daily   unk   timolol (TIMOPTIC-XR) 0.5 % ophthalmic gel-forming Place 1 drop into both eyes daily.   unk   VITAMIN A PO Take 1 tablet by mouth daily.   07/05/2022   VITAMIN D PO Take 1 tablet by mouth daily.   07/05/2022   Accu-Chek Softclix Lancets lancets Use to test blood glucose four times daily as directed. 100 each 1    Blood Glucose Monitoring Suppl (BLOOD GLUCOSE MONITOR SYSTEM) w/Device KIT Use up to four times daily as directed. 1 kit 1    glucose blood test strip Use to test blood glucose four times daily as directed 100 each 1    losartan (COZAAR) 25 MG tablet Take 1 tablet (25 mg total) by mouth at bedtime. (Patient not taking: Reported on 07/05/2022) 30 tablet 1 Not Taking   OXYGEN Inhale 2 L into the lungs daily.       Scheduled:  Infusions:  PRN:   Assessment: 38 yom with a history of anemia, HF, chronic resp failure, DVT s/p IVC, ESRD on HD MWF, HTN, NSTEMI, pulmonary fibrosis. Patient is presenting with chest pain. Heparin per pharmacy consult placed for chest pain/ACS.  Patient is not on anticoagulation prior to arrival.  Hgb 10 - unchanged from earlier this month; plt 309  Heparin level this morning is 0.28, slightly below goal.  10/24 AM update:  Heparin level low No issues per RN  Goal of Therapy:   Heparin level 0.3-0.7 units/ml Monitor platelets by anticoagulation protocol: Yes   Plan:  Inc heparin  to 1300 units/hr Repeat heparin level in 8 hrs. Daily heparin level and CBC.  Narda Bonds, PharmD, BCPS Clinical Pharmacist Phone: [phone]

## 2022-07-06 NOTE — Progress Notes (Signed)
  Echocardiogram 2D Echocardiogram has been performed.  Travis [name] 07/06/2022, 4:39 PM

## 2022-07-07 LAB — IRON AND TIBC
Iron: 47 ug/dL (ref 45–182)
Saturation Ratios: 23 % (ref 17.9–39.5)
TIBC: 202 ug/dL — ABNORMAL LOW (ref 250–450)
UIBC: 155 ug/dL

## 2022-07-07 LAB — RENAL FUNCTION PANEL
Albumin: 2.9 g/dL — ABNORMAL LOW (ref 3.5–5.0)
Anion gap: 13 (ref 5–15)
BUN: 34 mg/dL — ABNORMAL HIGH (ref 8–23)
CO2: 29 mmol/L (ref 22–32)
Calcium: 9.1 mg/dL (ref 8.9–10.3)
Chloride: 94 mmol/L — ABNORMAL LOW (ref 98–111)
Creatinine, Ser: 10.76 mg/dL — ABNORMAL HIGH (ref 0.61–1.24)
GFR, Estimated: 4 mL/min — ABNORMAL LOW (ref >60)
Glucose, Bld: 94 mg/dL (ref 70–99)
Phosphorus: 6.2 mg/dL — ABNORMAL HIGH (ref 2.5–4.6)
Potassium: 4.3 mmol/L (ref 3.5–5.1)
Sodium: 136 mmol/L (ref 135–145)

## 2022-07-07 LAB — CBC
HCT: 26.3 % — ABNORMAL LOW (ref 39.0–52.0)
Hemoglobin: 8.2 g/dL — ABNORMAL LOW (ref 13.0–17.0)
MCH: 29.8 pg (ref 26.0–34.0)
MCHC: 31.2 g/dL (ref 30.0–36.0)
MCV: 95.6 fL (ref 80.0–100.0)
Platelets: 241 10*3/uL (ref 150–400)
RBC: 2.75 MIL/uL — ABNORMAL LOW (ref 4.22–5.81)
RDW: 15.2 % (ref 11.5–15.5)
WBC: 7.3 10*3/uL (ref 4.0–10.5)
nRBC: 0 % (ref 0.0–0.2)

## 2022-07-07 LAB — RETICULOCYTES
Immature Retic Fract: 27.7 % — ABNORMAL HIGH (ref 2.3–15.9)
RBC.: 2.74 MIL/uL — ABNORMAL LOW (ref 4.22–5.81)
Retic Count, Absolute: 64.4 10*3/uL (ref 19.0–186.0)
Retic Ct Pct: 2.4 % (ref 0.4–3.1)

## 2022-07-07 LAB — VITAMIN B12: Vitamin B-12: 1806 pg/mL — ABNORMAL HIGH (ref 180–914)

## 2022-07-07 LAB — FOLATE: Folate: 4.5 ng/mL — ABNORMAL LOW (ref >5.9)

## 2022-07-07 LAB — FERRITIN: Ferritin: 1310 ng/mL — ABNORMAL HIGH (ref 24–336)

## 2022-07-07 NOTE — TOC Progression Note (Signed)
Transition of Care [facility]) - Progression Note    Patient Details  Name: Travis [name] MRN: [id number] Date of Birth: 03-29-1941  Transition of Care [facility]) CM/SW Contact  Graves-Bigelow, Ocie Cornfield, RN Phone Number: 07/07/2022, 11:56 AM  Clinical Narrative: Case Manager spoke with the daughter regarding disposition and home health needs. Patient and daughter agreeable to Services. Medicare.gov list provided and family chose [facility] have used the agency in the past. Referral submitted to [facility] and start of care to begin within 24-48 hours post transition home. Case Manager will continue to follow for additional transition of care needs as the patient progresses.    Expected Discharge Plan: [place] Barriers to Discharge: Continued Medical Work up  Expected Discharge Plan and Services Expected Discharge Plan: [place] In-house Referral: NA Discharge Planning Services: CM Consult Post Acute Care Choice: NA Living arrangements for the past 2 months: Single Family Home                 DME Arranged: N/A DME Agency: NA       HH Arranged: PT, OT HH Agency: [place] Date [place]: 07/07/22 Time [place]: 1156 Representative spoke with at [place]: [place]  Readmission Risk Interventions    07/05/2022   12:00 PM 12/22/2021    5:33 PM 05/26/2021   12:03 PM  Readmission Risk Prevention Plan  Transportation Screening Complete Complete Complete  PCP or Specialist Appt within 3-5 Days  Complete Complete  HRI or Home Care Consult Complete Complete Complete  Social Work Consult for [place] Planning/Counseling Complete Complete Complete  Palliative Care Screening Not Applicable Not Applicable Not Applicable  Medication Review Press photographer) Referral to Pharmacy Complete Complete

## 2022-07-07 NOTE — Progress Notes (Addendum)
PROGRESS NOTE    Travis [name]  HYW:[id number] DOB: Sep 22, 1940 DOA: 07/04/2022 PCP: Travis Amel, MD   81 yo male with the past medical history of ERSD, pulmonary hypertension, DVT  and chronic anemia who presented with chest pain,. 4 to 5 days of dyspnea and orthopnea. High sensitive troponin 130  Chest radiograph , bilateral hilar vascular congestion, with bilateral interstitial infiltrate Patient underwent HD with improvement in his symptoms.  10/24 cardiac catheterization with moderate severe 2 vessel single vessel disease. PCWP 22.  Recommended continue medical therapy and fluid management per HD  Subjective: Feels short of breath this morning, waiting for dialysis  Assessment and Plan:  Acute on chronic diastolic CHF  Echo EFd 60 to 65%, mild LVH, RV systolic preserved, mild to moderate TR. -Appears volume overloaded, HD today, lower dry weight as tolerated -Continue with amlodipine, carvedilol, and losartan.  -Ambulate, PT eval today -Discharge planning, if respiratory status status stable after HD   Chest pain, stable angina Coronary artery disease -ECHo w/ preserved EF -Treated with IV heparin, Cardiac catheterization with 2 vessel disease-medical management was recommended -continue ASA/statin/coreg   End stage renal disease on dialysis ([place]) -renal following, for a 2D today, lower dry weight as tolerated   Anemia of chronic renal disease -Slight downtrend in hemoglobin, likely hemodilution contributing as well Continue with EPO.    GERD (gastroesophageal reflux disease) Continue with pantoprazole, change to po.  Advance diet post cardiac catheterization    HTN (hypertension) Continue blood pressure control with amlodipine, carvedilol and losartan    DVT prophylaxis: hep SQ Code Status: Full Code Family Communication: Daughter at bedside Disposition Plan: Home later today after dialysis or tomorrow  Consultants: Cards, nephrology   Procedures:    Antimicrobials:    Objective: Vitals:   07/06/22 1505 07/06/22 1535 07/06/22 2040 07/07/22 0540  BP: (!) 152/67 (!) 147/76 137/77 137/66  Pulse:   74 63  Resp: (!) 22 19 20 17   Temp:   98 F (36.7 C) 98 F (36.7 C)  TempSrc:   Oral Oral  SpO2:   100% 99%  Weight:    70.7 kg  Height:        Intake/Output Summary (Last 24 hours) at 07/07/2022 1157 Last data filed at 07/06/2022 1500 Gross per 24 hour  Intake 80.17 ml  Output --  Net 80.17 ml   Filed Weights   07/05/22 1400 07/05/22 1543 07/07/22 0540  Weight: 72 kg 70 kg 70.7 kg    Examination:  General exam: Appears calm and comfortable, AAOx2 Respiratory system: Few basilar Rales Cardiovascular system: S1 & S2 heard, RRR.  Abd: nondistended, soft and nontender.Normal bowel sounds heard. Central nervous system: Alert and oriented. No focal neurological deficits. Extremities: no edema Skin: No rashes Psychiatry:  Mood & affect appropriate.     Data Reviewed:   CBC: Recent Labs  Lab 07/04/22 0755 07/05/22 0215 07/06/22 0311 07/06/22 1147 07/06/22 1150 07/06/22 1153 07/07/22 0602  WBC 10.1 9.9 8.9  --   --   --  7.3  HGB 10.0* 8.9* 8.9* 8.5* 8.8* 8.5* 8.2*  HCT 31.3* 27.6* 28.0* 25.0* 26.0* 25.0* 26.3*  MCV 96.0 93.9 95.2  --   --   --  95.6  PLT 309 260 282  --   --   --  485   Basic Metabolic Panel: Recent Labs  Lab 07/04/22 0755 07/05/22 0215 07/06/22 1147 07/06/22 1150 07/06/22 1153 07/07/22 0602  NA 136 136 137 134* 137 136  K  3.8 4.6 3.9 4.0 3.9 4.3  CL 92* 94*  --   --   --  94*  CO2 30 27  --   --   --  29  GLUCOSE 110* 96  --   --   --  94  BUN 27* 38*  --   --   --  34*  CREATININE 9.44* 10.91*  --   --   --  10.76*  CALCIUM 9.0 8.7*  --   --   --  9.1  MG  --  2.1  --   --   --   --   PHOS  --  6.5*  --   --   --  6.2*   GFR: Estimated Creatinine Clearance: 5.5 mL/min (A) (by C-G formula based on SCr of 10.76 mg/dL (H)). Liver Function Tests: Recent Labs  Lab  07/05/22 0215 07/07/22 0602  AST 14*  --   ALT 7  --   ALKPHOS 69  --   BILITOT 0.7  --   PROT 6.9  --   ALBUMIN 3.0* 2.9*   Recent Labs  Lab 07/05/22 0215  LIPASE 36   No results for input(s): "AMMONIA" in the last 168 hours. Coagulation Profile: Recent Labs  Lab 07/04/22 1136 07/05/22 0215  INR 1.0 1.1   Cardiac Enzymes: No results for input(s): "CKTOTAL", "CKMB", "CKMBINDEX", "TROPONINI" in the last 168 hours. BNP (last 3 results) No results for input(s): "PROBNP" in the last 8760 hours. HbA1C: No results for input(s): "HGBA1C" in the last 72 hours. CBG: Recent Labs  Lab 07/05/22 1635 07/06/22 0813  GLUCAP 13* 91   Lipid Profile: No results for input(s): "CHOL", "HDL", "LDLCALC", "TRIG", "CHOLHDL", "LDLDIRECT" in the last 72 hours. Thyroid Function Tests: No results for input(s): "TSH", "T4TOTAL", "FREET4", "T3FREE", "THYROIDAB" in the last 72 hours. Anemia Panel: Recent Labs    07/05/22 0215 07/05/22 1017  FERRITIN  --  1,001*  TIBC  --  196*  IRON  --  45  RETICCTPCT 1.8  --    Urine analysis:    Component Value Date/Time   COLORURINE AMBER (A) 01/29/2015 2200   APPEARANCEUR CLOUDY (A) 01/29/2015 2200   LABSPEC 1.013 01/29/2015 2200   PHURINE 5.0 01/29/2015 2200   GLUCOSEU NEGATIVE 01/29/2015 2200   HGBUR LARGE (A) 01/29/2015 2200   BILIRUBINUR NEGATIVE 01/29/2015 2200   KETONESUR NEGATIVE 01/29/2015 2200   PROTEINUR 100 (A) 01/29/2015 2200   UROBILINOGEN 0.2 01/29/2015 2200   NITRITE NEGATIVE 01/29/2015 2200   LEUKOCYTESUR NEGATIVE 01/29/2015 2200   Sepsis Labs: @LABRCNTIP (procalcitonin:4,lacticidven:4)  )No results found for this or any previous visit (from the past 240 hour(s)).   Radiology Studies: ECHOCARDIOGRAM COMPLETE  Result Date: 07/06/2022    ECHOCARDIOGRAM REPORT   Patient Name:   Travis [name] Date of Exam: 07/06/2022 Medical Rec #:  [id number]       Height:       70.0 in Accession #:    [id number]      Weight:       154.3  lb Date of Birth:  1941-06-01       BSA:          1.870 m Patient Age:    62 years        BP:           147/76 mmHg Patient Gender: M               HR:  66 bpm. Exam Location:  Inpatient Procedure: 2D Echo, 3D Echo, Cardiac Doppler and Color Doppler Indications:     NSTEMI I21.4  History:         Patient has prior history of Echocardiogram examinations, most                  recent 11/11/2020. CHF, NSTEMI, Signs/Symptoms:Chest Pain; Risk                  Factors:Hypertension and Non-Smoker.  Sonographer:     Greer Pickerel Referring Phys:  [id number] Lequita Halt Diagnosing Phys: Eleonore Chiquito MD  Sonographer Comments: Suboptimal subcostal window. Image acquisition challenging due to respiratory motion. IMPRESSIONS  1. Left ventricular ejection fraction, by estimation, is 45 to 50%. Left ventricular ejection fraction by 2D MOD biplane is 47.4 %. The left ventricle has mildly decreased function. The left ventricle has no regional wall motion abnormalities. There is moderate asymmetric left ventricular hypertrophy of the infero-lateral segment. Left ventricular diastolic parameters are consistent with Grade II diastolic dysfunction (pseudonormalization).  2. Right ventricular systolic function is normal. The right ventricular size is normal. Tricuspid regurgitation signal is inadequate for assessing PA pressure.  3. The mitral valve is grossly normal. Mild mitral valve regurgitation. No evidence of mitral stenosis.  4. The aortic valve is tricuspid. There is moderate calcification of the aortic valve. There is moderate thickening of the aortic valve. Aortic valve regurgitation is mild. Mild aortic valve stenosis.  5. The inferior vena cava is normal in size with greater than 50% respiratory variability, suggesting right atrial pressure of 3 mmHg. Comparison(s): Changes from prior study are noted. The left ventricular function is worsened. FINDINGS  Left Ventricle: Left ventricular ejection fraction, by estimation,  is 45 to 50%. Left ventricular ejection fraction by 2D MOD biplane is 47.4 %. The left ventricle has mildly decreased function. The left ventricle has no regional wall motion abnormalities. 3D left ventricular ejection fraction analysis performed but not reported based on interpreter judgement due to suboptimal tracking. The left ventricular internal cavity size was normal in size. There is moderate asymmetric left ventricular hypertrophy of the infero-lateral segment. Left ventricular diastolic parameters are consistent with Grade II diastolic dysfunction (pseudonormalization). Right Ventricle: The right ventricular size is normal. No increase in right ventricular wall thickness. Right ventricular systolic function is normal. Tricuspid regurgitation signal is inadequate for assessing PA pressure. Left Atrium: Left atrial size was normal in size. Right Atrium: Right atrial size was normal in size. Pericardium: There is no evidence of pericardial effusion. Mitral Valve: The mitral valve is grossly normal. Mild mitral valve regurgitation. No evidence of mitral valve stenosis. Tricuspid Valve: The tricuspid valve is grossly normal. Tricuspid valve regurgitation is mild . No evidence of tricuspid stenosis. Aortic Valve: The aortic valve is tricuspid. There is moderate calcification of the aortic valve. There is moderate thickening of the aortic valve. Aortic valve regurgitation is mild. Aortic regurgitation PHT measures 460 msec. Mild aortic stenosis is present. Aortic valve mean gradient measures 15.0 mmHg. Aortic valve peak gradient measures 27.2 mmHg. Aortic valve area, by VTI measures 1.11 cm. Pulmonic Valve: The pulmonic valve was grossly normal. Pulmonic valve regurgitation is trivial. No evidence of pulmonic stenosis. Aorta: The aortic root and ascending aorta are structurally normal, with no evidence of dilitation. Venous: The inferior vena cava is normal in size with greater than 50% respiratory variability,  suggesting right atrial pressure of 3 mmHg. IAS/Shunts: The atrial septum is grossly normal.  LEFT VENTRICLE PLAX 2D                        Biplane EF (MOD) LVIDd:         5.10 cm         LV Biplane EF:   Left LVIDs:         3.90 cm                          ventricular LV PW:         1.80 cm                          ejection LV IVS:        0.60 cm                          fraction by LVOT diam:     1.80 cm                          2D MOD LV SV:         61                               biplane is LV SV Index:   33                               47.4 %. LVOT Area:     2.54 cm                                Diastology                                LV e' medial:    4.57 cm/s LV Volumes (MOD)               LV E/e' medial:  26.9 LV vol d, MOD    121.0 ml      LV e' lateral:   5.98 cm/s A2C:                           LV E/e' lateral: 20.6 LV vol d, MOD    99.0 ml A4C: LV vol s, MOD    64.6 ml A2C: LV vol s, MOD    53.5 ml       3D Volume EF: A4C:                           3D EF:        53 % LV SV MOD A2C:   56.4 ml       LV EDV:       207 ml LV SV MOD A4C:   99.0 ml       LV ESV:       97 ml LV SV MOD BP:    52.7 ml       LV SV:        110 ml RIGHT VENTRICLE RV S prime:     8.92 cm/s TAPSE (M-mode):  2.0 cm LEFT ATRIUM             Index        RIGHT ATRIUM           Index LA diam:        3.60 cm 1.93 cm/m   RA Area:     17.50 cm LA Vol (A2C):   87.0 ml 46.54 ml/m  RA Volume:   46.90 ml  25.09 ml/m LA Vol (A4C):   32.3 ml 17.28 ml/m LA Biplane Vol: 52.7 ml 28.19 ml/m  AORTIC VALVE                     PULMONIC VALVE AV Area (Vmax):    1.13 cm      PR End Diast Vel: 5.02 msec AV Area (Vmean):   1.16 cm AV Area (VTI):     1.11 cm AV Vmax:           261.00 cm/s AV Vmean:          182.500 cm/s AV VTI:            0.546 m AV Peak Grad:      27.2 mmHg AV Mean Grad:      15.0 mmHg LVOT Vmax:         116.00 cm/s LVOT Vmean:        83.300 cm/s LVOT VTI:          0.239 m LVOT/AV VTI ratio: 0.44 AI PHT:            460 msec   AORTA Ao Root diam: 3.90 cm Ao Asc diam:  3.70 cm MITRAL VALVE MV Area (PHT): 3.60 cm     SHUNTS MV Decel Time: 211 msec     Systemic VTI:  0.24 m MV E velocity: 123.00 cm/s  Systemic Diam: 1.80 cm MV A velocity: 75.00 cm/s MV E/A ratio:  1.64 Eleonore Chiquito MD Electronically signed by Eleonore Chiquito MD Signature Date/Time: 07/06/2022/6:51:56 PM    Final (Updated)    CARDIAC CATHETERIZATION  Addendum Date: 07/06/2022     Heavily calcified diffusely diseased vessel: 1st Diag-1 lesion is 99% stenosed. 1st Diag-2 lesion is 60% stenosed. 1st Diag-3 lesion is 95% stenosed.   The left ventricular systolic function is normal. The left ventricular ejection fraction is 50-55% by visual estimate.   LV end diastolic pressure is moderately elevated.   Hemodynamic findings consistent with mild pulmonary hypertension. POSTOP DIAGNOSES Moderate-severe 2-vessel single-vessel disease: Proximal D1-99%, mid 60%, distal 95% (heavily calcified/diffusely diseased; not favorable for PCI would require atherectomy and a relatively small caliber vessel) & 60% ostial sidebranch of OM1 followed by a 60% mid vessel then distal occlusion (with right to left collaterals). Otherwise mild diffuse disease/calcified vessels Mostly preserved LVEF, cannot exclude anterolateral hypokinesis.  LVEDP moderately elevated at 19 mmHg (LVEDP 166/5 mmHg) with a PCWP of ~22 mmHg. RHC numbers: Mean RAP 6 mmHg, RVP-EDP 51/0-5 mmHg; PAP-mean 50/13-27 mmHg; PCWP 17-26 mmHg (~22 mmHg); Ao sat 99%, PA sat 74%.  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  This is heavily calcified vessel with diffuse disease, will require atherectomy and extensive stent placement in the vessel is roughly 2 to 2.25 mm distally.  Not favorable for PCI. Consider slightly increased volume removal and HD based on elevated filling pressures. Return  to nursing for ongoing care Shanon Brow  Ellyn Hack, MD   Result Date: 07/06/2022   Heavily calcified diffusely diseased vessel: 1st Diag-1 lesion is 99% stenosed. 1st Diag-2 lesion is 60% stenosed. 1st Diag-3 lesion is 95% stenosed.   The left ventricular systolic function is normal. The left ventricular ejection fraction is 50-55% by visual estimate.   LV end diastolic pressure is moderately elevated.   Hemodynamic findings consistent with mild pulmonary hypertension. POSTOP DIAGNOSES Moderate-severe 2-vessel single-vessel disease: Proximal D1-99%, mid 60%, distal 95% (heavily calcified/diffusely diseased; not favorable for PCI would require atherectomy and a relatively small caliber vessel) & 60% ostial sidebranch of OM1 followed by a 60% mid vessel then distal occlusion (with right to left collaterals). Otherwise mild diffuse disease/calcified vessels Mostly preserved LVEF, cannot exclude anterolateral hypokinesis.  LVEDP moderately elevated at 19 mmHg (LVEDP 166/5 mmHg) with a PCWP of ~22 mmHg. RHC numbers: Mean RAP 6 mmHg, RVP-EDP 51/0-5 mmHg; PAP-mean 50/13-27 mmHg; PCWP 17-26 mmHg (~22 mmHg); Ao sat 99%, PA sat 74%.  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  This is heavily calcified vessel with diffuse disease, will require atherectomy and extensive stent placement in the vessel is roughly 2 to 2.25 mm distally.  Not favorable for PCI. Consider slightly increased volume removal and HD based on elevated filling pressures. Return to nursing for ongoing care Glenetta Hew, MD    Scheduled Meds:  amLODipine  10 mg Oral Daily   aspirin  81 mg Oral Daily   atorvastatin  40 mg Oral q1800   calcitRIOL  2.75 mcg Oral Q M,W,F-HD   carvedilol  12.5 mg Oral BID WC   Chlorhexidine Gluconate Cloth  6 each Topical Q0600   cinacalcet  90 mg Oral Q M,W,F-HD   darbepoetin (ARANESP)  injection - DIALYSIS  40 mcg Intravenous Q Mon-HD   heparin injection (subcutaneous)  5,000 Units Subcutaneous Q8H   losartan  25 mg Oral QHS   pantoprazole  40 mg Oral Daily   sevelamer carbonate  3,200 mg Oral TID WC   sodium chloride flush  3 mL Intravenous Q12H   Continuous Infusions:  sodium chloride     albumin human       LOS: 3 days    Time spent: 19min    Domenic Polite, MD [facility]   07/07/2022, 11:57 AM

## 2022-07-07 NOTE — Progress Notes (Addendum)
[facility] Progress Note   Subjective:   Patient seen and examined in room with daughter at bedside.  Eating breakfast.  Denies CP, SOB, abdominal pain and n/v/d.   Objective Vitals:   07/06/22 1505 07/06/22 1535 07/06/22 2040 07/07/22 0540  BP: (!) 152/67 (!) 147/76 137/77 137/66  Pulse:   74 63  Resp: (!) 22 19 20 17   Temp:   98 F (36.7 C) 98 F (36.7 C)  TempSrc:   Oral Oral  SpO2:   100% 99%  Weight:    70.7 kg  Height:       Physical Exam General:WDWN elderly patient in NAD Heart:RRR, no mrg Lungs:CTAB, nml WOB on RA Abdomen:soft, NTND Extremities:no LE edema Dialysis Access: RU AVG +b/t   Filed Weights   07/05/22 1400 07/05/22 1543 07/07/22 0540  Weight: 72 kg 70 kg 70.7 kg    Intake/Output Summary (Last 24 hours) at 07/07/2022 1116 Last data filed at 07/06/2022 1500 Gross per 24 hour  Intake 80.17 ml  Output --  Net 80.17 ml    Additional Objective Labs: Basic Metabolic Panel: Recent Labs  Lab 07/04/22 0755 07/05/22 0215 07/06/22 1147 07/06/22 1150 07/06/22 1153 07/07/22 0602  NA 136 136   < > 134* 137 136  K 3.8 4.6   < > 4.0 3.9 4.3  CL 92* 94*  --   --   --  94*  CO2 30 27  --   --   --  29  GLUCOSE 110* 96  --   --   --  94  BUN 27* 38*  --   --   --  34*  CREATININE 9.44* 10.91*  --   --   --  10.76*  CALCIUM 9.0 8.7*  --   --   --  9.1  PHOS  --  6.5*  --   --   --  6.2*   < > = values in this interval not displayed.   Liver Function Tests: Recent Labs  Lab 07/05/22 0215 07/07/22 0602  AST 14*  --   ALT 7  --   ALKPHOS 69  --   BILITOT 0.7  --   PROT 6.9  --   ALBUMIN 3.0* 2.9*   Recent Labs  Lab 07/05/22 0215  LIPASE 36   CBC: Recent Labs  Lab 07/04/22 0755 07/05/22 0215 07/06/22 0311 07/06/22 1147 07/06/22 1150 07/06/22 1153 07/07/22 0602  WBC 10.1 9.9 8.9  --   --   --  7.3  HGB 10.0* 8.9* 8.9*   < > 8.8* 8.5* 8.2*  HCT 31.3* 27.6* 28.0*   < > 26.0* 25.0* 26.3*  MCV 96.0 93.9 95.2  --   --    --  95.6  PLT 309 260 282  --   --   --  241   < > = values in this interval not displayed.   CBG: Recent Labs  Lab 07/05/22 1635 07/06/22 0813  GLUCAP 13* 91   Iron Studies:  Recent Labs    07/05/22 1017  IRON 45  TIBC 196*  FERRITIN 1,001*   Lab Results  Component Value Date   INR 1.1 07/05/2022   INR 1.0 07/04/2022   INR 1.0 12/18/2021   Studies/Results: ECHOCARDIOGRAM COMPLETE  Result Date: 07/06/2022    ECHOCARDIOGRAM REPORT   Patient Name:   Travis [name] Date of Exam: 07/06/2022 Medical Rec #:  [id number]       Height:  70.0 in Accession #:    [id number]      Weight:       154.3 lb Date of Birth:  1941-02-19       BSA:          1.870 m Patient Age:    81 years        BP:           147/76 mmHg Patient Gender: M               HR:           66 bpm. Exam Location:  Inpatient Procedure: 2D Echo, 3D Echo, Cardiac Doppler and Color Doppler Indications:     NSTEMI I21.4  History:         Patient has prior history of Echocardiogram examinations, most                  recent 11/11/2020. CHF, NSTEMI, Signs/Symptoms:Chest Pain; Risk                  Factors:Hypertension and Non-Smoker.  Sonographer:     Greer Pickerel Referring Phys:  [id number] Lequita Halt Diagnosing Phys: Eleonore Chiquito MD  Sonographer Comments: Suboptimal subcostal window. Image acquisition challenging due to respiratory motion. IMPRESSIONS  1. Left ventricular ejection fraction, by estimation, is 45 to 50%. Left ventricular ejection fraction by 2D MOD biplane is 47.4 %. The left ventricle has mildly decreased function. The left ventricle has no regional wall motion abnormalities. There is moderate asymmetric left ventricular hypertrophy of the infero-lateral segment. Left ventricular diastolic parameters are consistent with Grade II diastolic dysfunction (pseudonormalization).  2. Right ventricular systolic function is normal. The right ventricular size is normal. Tricuspid regurgitation signal is inadequate for assessing  PA pressure.  3. The mitral valve is grossly normal. Mild mitral valve regurgitation. No evidence of mitral stenosis.  4. The aortic valve is tricuspid. There is moderate calcification of the aortic valve. There is moderate thickening of the aortic valve. Aortic valve regurgitation is mild. Mild aortic valve stenosis.  5. The inferior vena cava is normal in size with greater than 50% respiratory variability, suggesting right atrial pressure of 3 mmHg. Comparison(s): Changes from prior study are noted. The left ventricular function is worsened. FINDINGS  Left Ventricle: Left ventricular ejection fraction, by estimation, is 45 to 50%. Left ventricular ejection fraction by 2D MOD biplane is 47.4 %. The left ventricle has mildly decreased function. The left ventricle has no regional wall motion abnormalities. 3D left ventricular ejection fraction analysis performed but not reported based on interpreter judgement due to suboptimal tracking. The left ventricular internal cavity size was normal in size. There is moderate asymmetric left ventricular hypertrophy of the infero-lateral segment. Left ventricular diastolic parameters are consistent with Grade II diastolic dysfunction (pseudonormalization). Right Ventricle: The right ventricular size is normal. No increase in right ventricular wall thickness. Right ventricular systolic function is normal. Tricuspid regurgitation signal is inadequate for assessing PA pressure. Left Atrium: Left atrial size was normal in size. Right Atrium: Right atrial size was normal in size. Pericardium: There is no evidence of pericardial effusion. Mitral Valve: The mitral valve is grossly normal. Mild mitral valve regurgitation. No evidence of mitral valve stenosis. Tricuspid Valve: The tricuspid valve is grossly normal. Tricuspid valve regurgitation is mild . No evidence of tricuspid stenosis. Aortic Valve: The aortic valve is tricuspid. There is moderate calcification of the aortic valve.  There is moderate thickening of the aortic valve. Aortic  valve regurgitation is mild. Aortic regurgitation PHT measures 460 msec. Mild aortic stenosis is present. Aortic valve mean gradient measures 15.0 mmHg. Aortic valve peak gradient measures 27.2 mmHg. Aortic valve area, by VTI measures 1.11 cm. Pulmonic Valve: The pulmonic valve was grossly normal. Pulmonic valve regurgitation is trivial. No evidence of pulmonic stenosis. Aorta: The aortic root and ascending aorta are structurally normal, with no evidence of dilitation. Venous: The inferior vena cava is normal in size with greater than 50% respiratory variability, suggesting right atrial pressure of 3 mmHg. IAS/Shunts: The atrial septum is grossly normal.  LEFT VENTRICLE PLAX 2D                        Biplane EF (MOD) LVIDd:         5.10 cm         LV Biplane EF:   Left LVIDs:         3.90 cm                          ventricular LV PW:         1.80 cm                          ejection LV IVS:        0.60 cm                          fraction by LVOT diam:     1.80 cm                          2D MOD LV SV:         61                               biplane is LV SV Index:   33                               47.4 %. LVOT Area:     2.54 cm                                Diastology                                LV e' medial:    4.57 cm/s LV Volumes (MOD)               LV E/e' medial:  26.9 LV vol d, MOD    121.0 ml      LV e' lateral:   5.98 cm/s A2C:                           LV E/e' lateral: 20.6 LV vol d, MOD    99.0 ml A4C: LV vol s, MOD    64.6 ml A2C: LV vol s, MOD    53.5 ml       3D Volume EF: A4C:  3D EF:        53 % LV SV MOD A2C:   56.4 ml       LV EDV:       207 ml LV SV MOD A4C:   99.0 ml       LV ESV:       97 ml LV SV MOD BP:    52.7 ml       LV SV:        110 ml RIGHT VENTRICLE RV S prime:     8.92 cm/s TAPSE (M-mode): 2.0 cm LEFT ATRIUM             Index        RIGHT ATRIUM           Index LA diam:        3.60 cm 1.93 cm/m    RA Area:     17.50 cm LA Vol (A2C):   87.0 ml 46.54 ml/m  RA Volume:   46.90 ml  25.09 ml/m LA Vol (A4C):   32.3 ml 17.28 ml/m LA Biplane Vol: 52.7 ml 28.19 ml/m  AORTIC VALVE                     PULMONIC VALVE AV Area (Vmax):    1.13 cm      PR End Diast Vel: 5.02 msec AV Area (Vmean):   1.16 cm AV Area (VTI):     1.11 cm AV Vmax:           261.00 cm/s AV Vmean:          182.500 cm/s AV VTI:            0.546 m AV Peak Grad:      27.2 mmHg AV Mean Grad:      15.0 mmHg LVOT Vmax:         116.00 cm/s LVOT Vmean:        83.300 cm/s LVOT VTI:          0.239 m LVOT/AV VTI ratio: 0.44 AI PHT:            460 msec  AORTA Ao Root diam: 3.90 cm Ao Asc diam:  3.70 cm MITRAL VALVE MV Area (PHT): 3.60 cm     SHUNTS MV Decel Time: 211 msec     Systemic VTI:  0.24 m MV E velocity: 123.00 cm/s  Systemic Diam: 1.80 cm MV A velocity: 75.00 cm/s MV E/A ratio:  1.64 Eleonore Chiquito MD Electronically signed by Eleonore Chiquito MD Signature Date/Time: 07/06/2022/6:51:56 PM    Final (Updated)    CARDIAC CATHETERIZATION  Addendum Date: 07/06/2022     Heavily calcified diffusely diseased vessel: 1st Diag-1 lesion is 99% stenosed. 1st Diag-2 lesion is 60% stenosed. 1st Diag-3 lesion is 95% stenosed.   The left ventricular systolic function is normal. The left ventricular ejection fraction is 50-55% by visual estimate.   LV end diastolic pressure is moderately elevated.   Hemodynamic findings consistent with mild pulmonary hypertension. POSTOP DIAGNOSES Moderate-severe 2-vessel single-vessel disease: Proximal D1-99%, mid 60%, distal 95% (heavily calcified/diffusely diseased; not favorable for PCI would require atherectomy and a relatively small caliber vessel) & 60% ostial sidebranch of OM1 followed by a 60% mid vessel then distal occlusion (with right to left collaterals). Otherwise mild diffuse disease/calcified vessels Mostly preserved LVEF, cannot exclude anterolateral hypokinesis.  LVEDP moderately elevated at 19 mmHg (LVEDP  166/5 mmHg) with a PCWP of ~22 mmHg. RHC numbers:  Mean RAP 6 mmHg, RVP-EDP 51/0-5 mmHg; PAP-mean 50/13-27 mmHg; PCWP 17-26 mmHg (~22 mmHg); Ao sat 99%, PA sat 74%.  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  This is heavily calcified vessel with diffuse disease, will require atherectomy and extensive stent placement in the vessel is roughly 2 to 2.25 mm distally.  Not favorable for PCI. Consider slightly increased volume removal and HD based on elevated filling pressures. Return to nursing for ongoing care Glenetta Hew, MD   Result Date: 07/06/2022   Heavily calcified diffusely diseased vessel: 1st Diag-1 lesion is 99% stenosed. 1st Diag-2 lesion is 60% stenosed. 1st Diag-3 lesion is 95% stenosed.   The left ventricular systolic function is normal. The left ventricular ejection fraction is 50-55% by visual estimate.   LV end diastolic pressure is moderately elevated.   Hemodynamic findings consistent with mild pulmonary hypertension. POSTOP DIAGNOSES Moderate-severe 2-vessel single-vessel disease: Proximal D1-99%, mid 60%, distal 95% (heavily calcified/diffusely diseased; not favorable for PCI would require atherectomy and a relatively small caliber vessel) & 60% ostial sidebranch of OM1 followed by a 60% mid vessel then distal occlusion (with right to left collaterals). Otherwise mild diffuse disease/calcified vessels Mostly preserved LVEF, cannot exclude anterolateral hypokinesis.  LVEDP moderately elevated at 19 mmHg (LVEDP 166/5 mmHg) with a PCWP of ~22 mmHg. RHC numbers: Mean RAP 6 mmHg, RVP-EDP 51/0-5 mmHg; PAP-mean 50/13-27 mmHg; PCWP 17-26 mmHg (~22 mmHg); Ao sat 99%, PA sat 74%.  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  This  is heavily calcified vessel with diffuse disease, will require atherectomy and extensive stent placement in the vessel is roughly 2 to 2.25 mm distally.  Not favorable for PCI. Consider slightly increased volume removal and HD based on elevated filling pressures. Return to nursing for ongoing care Glenetta Hew, MD   Medications:  sodium chloride     albumin human      amLODipine  10 mg Oral Daily   aspirin  81 mg Oral Daily   atorvastatin  40 mg Oral q1800   calcitRIOL  2.75 mcg Oral Q M,W,F-HD   carvedilol  12.5 mg Oral BID WC   Chlorhexidine Gluconate Cloth  6 each Topical Q0600   cinacalcet  90 mg Oral Q M,W,F-HD   darbepoetin (ARANESP) injection - DIALYSIS  40 mcg Intravenous Q Mon-HD   heparin injection (subcutaneous)  5,000 Units Subcutaneous Q8H   losartan  25 mg Oral QHS   pantoprazole  40 mg Oral Daily   sevelamer carbonate  3,200 mg Oral TID WC   sodium chloride flush  3 mL Intravenous Q12H    Dialysis Orders: MWF - [facility]  3.5hrs, BFR 400, DFR 1.5,  EDW 71.8, 2K/ 2Ca UFP 2   Access: RU AVG  Heparin none Mircera 30 mcg q2wks - last 10/4 Calcitriol 2.75 mcg PO qHD Sensipar 90mg  qHD     Assessment/Plan:  Pulmonary edema/Volume overload - interstitial edema noted on CXR.  Per weights close to dry.  Likely weight loss recently and needs lowering. Net UF 2L with HD yesterday.  Elevated filling pressure on RHC, will continue to titrate down volume as tolerated.  Get standing weight post HD to better assess new dry weight.  Chest pain- RHC & LHC 10/24. Moderate to severe 2 vessel, single vessel  disease with medical management recommended.  Not favorable for PCI.  Elevated filling pressures on RHC. Per cardiology.   ESRD -  on HD MWF.  Plan for HD today per regular schedule.  Hypertension - BP in goal. Not on antihypertensives at home.  Takes midodrine pre HD for BP support. Cardiology started carvedilol 12.5mg  BID, amlodipine 10mg  qd and losartan 25mg  qd.  Anemia of CKD - Hgb  8.9 - Aranesp 32mcg given with HD 07/05/22  Secondary Hyperparathyroidism -  Ca in goal. Phos elevated.  Continue binders, sensipar and calcitriol  Nutrition - Renal diet w/fluid restrictions Chronic diastolic HF - volume management w/HD. Mostly preserved EF with elevated filling pressures on HC. Dispo - plan to d/c post HD  Jen Mow, PA-C [place] 07/07/2022,11:16 AM  LOS: 3 days   Seen and examined independently.  Agree with note and exam as documented above by physician extender and as noted here.  Seen and examined on dialysis.  Blood pressure 104/57 and HR 55.  His UF goal was just lowered from 3.5 kg to 3 kg due to cramping in his legs.  Procedure supervised.  Right AVG in use.   Disposition per primary team.  They are currently planning on home after HD   Claudia Desanctis, MD 07/07/2022  3:23 PM

## 2022-07-07 NOTE — Evaluation (Signed)
Physical Therapy Evaluation Patient Details Name: Travis [name] MRN: [id number] DOB: 1941-04-10 Today's Date: 07/07/2022  History of Present Illness  Pt is an 81 y.o. male who presented 07/04/22 by way of transfer from [facility] ED with chest pain and diagnosed with acute on chronic diastolic heart failure. S/p cardiac cath 10/24 which demonstrated moderate severe 2 vessel single vessel disease. PMH: CHF, ESRD on HD, GERD, HTN, CAD, anemia, arthirits, hx of DVT, hypoglycemia, NSTEMI, pulmonary fibrosis, sjogren's disease, syncope   Clinical Impression  Pt presents with condition above and deficits mentioned below, see PT Problem List. PTA, he was requiring assistance for ADLs/iADLs and was mod I using a RW or furniture for support within the home and a bil platform rollator outside the home for mobility. Pt lives with his wife in a 1-level house with a ramp entrance option and has an aide assist him 3 days/week for transportation to/from HD and for ADLs/iADLs 2 days/week. Currently, pt demonstrates deficits in gross overall strength, endurance, and balance. He is at risk for falls, requiring minA for transfers and to ambulate with a RW at this time. Recommending follow-up with HHPT. Will continue to follow acutely.       Recommendations for follow up therapy are one component of a multi-disciplinary discharge planning process, led by the attending physician.  Recommendations may be updated based on patient status, additional functional criteria and insurance authorization.  Follow Up Recommendations Home health PT      Assistance Recommended at Discharge Frequent or constant Supervision/Assistance  Patient can return home with the following  A little help with walking and/or transfers;A little help with bathing/dressing/bathroom;Assistance with cooking/housework;Direct supervision/assist for medications management;Direct supervision/assist for financial management;Assist for  transportation;Help with stairs or ramp for entrance    Equipment Recommendations None recommended by PT  Recommendations for Other Services  OT consult    Functional Status Assessment Patient has had a recent decline in their functional status and demonstrates the ability to make significant improvements in function in a reasonable and predictable amount of time.     Precautions / Restrictions Precautions Precautions: Fall Restrictions Weight Bearing Restrictions: No      Mobility  Bed Mobility Overal bed mobility: Needs Assistance Bed Mobility: Supine to Sit     Supine to sit: Min assist, HOB elevated     General bed mobility comments: HHA minA to pull trunk up to sit L EOB with HOB slightly elevated, extra time.    Transfers Overall transfer level: Needs assistance Equipment used: Rolling walker (2 wheels) Transfers: Sit to/from Stand Sit to Stand: Min assist           General transfer comment: MinA to power up to stand and steady from low bed height to RW, cuing for hand placement, needing extra time to extend legs and trunk fully    Ambulation/Gait Ambulation/Gait assistance: Min assist Gait Distance (Feet): 45 Feet Assistive device: Rolling walker (2 wheels) Gait Pattern/deviations: Step-through pattern, Decreased stride length, Trunk flexed, Knee flexed in stance - left, Knee flexed in stance - right Gait velocity: reduced Gait velocity interpretation: <1.31 ft/sec, indicative of household ambulator   General Gait Details: Pt with slow, unsteady small steps initially, maintaining a flexed posture and flexed knees. As distance progressed, his upright posture and lower extremity stability improved, only needing light minA to ensure pt safety with mobility within the room.  Stairs            Wheelchair Mobility    Modified Rankin (Stroke  Patients Only)       Balance Overall balance assessment: Needs assistance Sitting-balance support: No upper  extremity supported, Feet supported Sitting balance-Leahy Scale: Fair     Standing balance support: Bilateral upper extremity supported, During functional activity, Reliant on assistive device for balance Standing balance-Leahy Scale: Poor Standing balance comment: Heavy reliance on RW initially                             Pertinent Vitals/Pain Pain Assessment Pain Assessment: Faces Faces Pain Scale: No hurt Pain Intervention(s): Monitored during session    Home Living Family/patient expects to be discharged to:: Private residence Living Arrangements: Spouse/significant other Available Help at Discharge: Family;Available 24 hours/day;Personal care attendant (wife would be unable to physically pick him up if he fell; daughter available intermittently) Type of Home: House Home Access: Stairs to enter;Ramped entrance Entrance Stairs-Rails: Right (ascending) Entrance Stairs-Number of Steps: 6 (ramp option in back)   Home Layout: One level Home Equipment: Cane - single point;Rollator (4 wheels);Other (comment);Rolling Walker (2 wheels);Toilet riser (bil platform rollator; elevating bed) Additional Comments: Nurse from [place] comes 5 days/week to transport pt to HD 3x/week and then on days that pt does not get HD he comes from 9:00-4:30    Prior Function Prior Level of Function : Needs assist       Physical Assist : ADLs (physical)   ADLs (physical): Bathing;Grooming;IADLs;Dressing Mobility Comments: Uses bil platform rollator in community, RW in home and intermittently furniture surfs. Fell ~6 months prior. ADLs Comments: Nurse helps in household chores, transportation, bathing, grooming, and intermittently with dressing     Hand Dominance        Extremity/Trunk Assessment   Upper Extremity Assessment Upper Extremity Assessment: Defer to OT evaluation    Lower Extremity Assessment Lower Extremity Assessment: Generalized weakness    Cervical / Trunk  Assessment Cervical / Trunk Assessment: Kyphotic  Communication   Communication: No difficulties  Cognition Arousal/Alertness: Awake/alert Behavior During Therapy: Flat affect Overall Cognitive Status: Within Functional Limits for tasks assessed                                 General Comments: Pt trailing off with his thoughts at times, but able to redirect and follow cues appropriately. Likely his baseline.        General Comments General comments (skin integrity, edema, etc.): educated pt and daughter on his risk for falls and recommendation for pt to use a RW at all times currently. Educated them on fluid and sodium restrictions and on progressing activity gradually. They verbalized understanding    Exercises     Assessment/Plan    PT Assessment Patient needs continued PT services  PT Problem List Decreased strength;Decreased activity tolerance;Decreased balance;Decreased mobility;Cardiopulmonary status limiting activity       PT Treatment Interventions Gait training;DME instruction;Stair training;Functional mobility training;Therapeutic activities;Therapeutic exercise;Balance training;Neuromuscular re-education;Patient/family education    PT Goals (Current goals can be found in the Care Plan section)  Acute Rehab PT Goals Patient Stated Goal: to get stronger PT Goal Formulation: With patient/family Time For Goal Achievement: 07/21/22 Potential to Achieve Goals: Good    Frequency Min 3X/week     Co-evaluation               AM-PAC PT "6 Clicks" Mobility  Outcome Measure Help needed turning from your back to your side while in a flat  bed without using bedrails?: A Little Help needed moving from lying on your back to sitting on the side of a flat bed without using bedrails?: A Little Help needed moving to and from a bed to a chair (including a wheelchair)?: A Little Help needed standing up from a chair using your arms (e.g., wheelchair or bedside  chair)?: A Little Help needed to walk in hospital room?: A Little Help needed climbing 3-5 steps with a railing? : A Little 6 Click Score: 18    End of Session Equipment Utilized During Treatment: Gait belt Activity Tolerance: Patient tolerated treatment well Patient left: in chair;with call bell/phone within reach;with chair alarm set Nurse Communication: Mobility status PT Visit Diagnosis: Unsteadiness on feet (R26.81);Other abnormalities of gait and mobility (R26.89);Muscle weakness (generalized) (M62.81);History of falling (Z91.81);Difficulty in walking, not elsewhere classified (R26.2)    Time: 0301-3143 PT Time Calculation (min) (ACUTE ONLY): 35 min   Charges:   PT Evaluation $PT Eval Moderate Complexity: 1 Mod PT Treatments $Therapeutic Activity: 8-22 mins        Moishe Spice, PT, DPT Acute Rehabilitation Services  Office: [phone]   Orvan Falconer 07/07/2022, 8:58 AM

## 2022-07-07 NOTE — Progress Notes (Signed)
Received patient in bed to unit.  Alert and oriented.  Informed consent signed and in chart.   Treatment initiated: 1313 Treatment completed: 1646  Patient tolerated well.  Transported back to the room  Alert, without acute distress.  Hand-off given to patient's nurse.   Access used: Graft Access issues: none  Total UF removed: 2750 Medication(s) given: none Post HD VS: 97.9, 132/63(82), HR-58,RR-20, SP02-98 Post HD weight: 68.4kg   Pt didn't meet goal due to BP dropping and legs cramping.    [facility]

## 2022-07-08 LAB — CBC
HCT: 28.1 % — ABNORMAL LOW (ref 39.0–52.0)
Hemoglobin: 8.9 g/dL — ABNORMAL LOW (ref 13.0–17.0)
MCH: 30 pg (ref 26.0–34.0)
MCHC: 31.7 g/dL (ref 30.0–36.0)
MCV: 94.6 fL (ref 80.0–100.0)
Platelets: 244 10*3/uL (ref 150–400)
RBC: 2.97 MIL/uL — ABNORMAL LOW (ref 4.22–5.81)
RDW: 15.1 % (ref 11.5–15.5)
WBC: 8.1 10*3/uL (ref 4.0–10.5)
nRBC: 0 % (ref 0.0–0.2)

## 2022-07-08 LAB — BASIC METABOLIC PANEL
Anion gap: 14 (ref 5–15)
BUN: 32 mg/dL — ABNORMAL HIGH (ref 8–23)
CO2: 27 mmol/L (ref 22–32)
Calcium: 9.2 mg/dL (ref 8.9–10.3)
Chloride: 93 mmol/L — ABNORMAL LOW (ref 98–111)
Creatinine, Ser: 9.62 mg/dL — ABNORMAL HIGH (ref 0.61–1.24)
GFR, Estimated: 5 mL/min — ABNORMAL LOW (ref >60)
Glucose, Bld: 99 mg/dL (ref 70–99)
Potassium: 4.2 mmol/L (ref 3.5–5.1)
Sodium: 134 mmol/L — ABNORMAL LOW (ref 135–145)

## 2022-07-08 LAB — LIPOPROTEIN A (LPA): Lipoprotein (a): 99.2 nmol/L — ABNORMAL HIGH (ref <75.0)

## 2022-07-08 LAB — HEPATITIS B SURFACE ANTIBODY, QUANTITATIVE: Hep B S AB Quant (Post): >1000 m[IU]/mL (ref >9.9)

## 2022-07-08 MED ORDER — NITROGLYCERIN 0.4 MG SL SUBL: 0.4000 mg | SUBLINGUAL_TABLET | SUBLINGUAL | 1 refills | Status: DC | PRN

## 2022-07-08 MED ORDER — CARVEDILOL 12.5 MG PO TABS: 12.5000 mg | ORAL_TABLET | Freq: Two times a day (BID) | ORAL | 0 refills | Status: DC

## 2022-07-08 NOTE — Evaluation (Signed)
Occupational Therapy Evaluation and Discharge Patient Details Name: Travis [name] MRN: [id number] DOB: 1941-06-12 Today's Date: 07/08/2022   History of Present Illness Pt is an 81 y.o. male who presented 07/04/22 by way of transfer from [facility] ED with chest pain and diagnosed with acute on chronic diastolic heart failure. S/p cardiac cath 10/24 which demonstrated moderate severe 2 vessel single vessel disease. PMH: CHF, ESRD on HD, GERD, HTN, CAD, anemia, arthirits, hx of DVT, hypoglycemia, NSTEMI, pulmonary fibrosis, sjogren's disease, syncope   Clinical Impression   Pt typically walks with a RW and is assisted for sponge bathing, dressing and all IADLs. He is likely near his baseline and has reliable assistance at home of his wife and an aide. No further OT needs.      Recommendations for follow up therapy are one component of a multi-disciplinary discharge planning process, led by the attending physician.  Recommendations may be updated based on patient status, additional functional criteria and insurance authorization.   Follow Up Recommendations  No OT follow up    Assistance Recommended at Discharge Frequent or constant Supervision/Assistance  Patient can return home with the following      Functional Status Assessment  Patient has had a recent decline in their functional status and demonstrates the ability to make significant improvements in function in a reasonable and predictable amount of time.  Equipment Recommendations  None recommended by OT    Recommendations for Other Services       Precautions / Restrictions Precautions Precautions: Fall Restrictions Weight Bearing Restrictions: No      Mobility Bed Mobility Overal bed mobility: Modified Independent             General bed mobility comments: HOB up, increased time    Transfers Overall transfer level: Needs assistance Equipment used: Rolling walker (2 wheels) Transfers: Sit to/from Stand Sit  to Stand: Min guard           General transfer comment: slow to rise, no assist      Balance Overall balance assessment: Needs assistance   Sitting balance-Leahy Scale: Fair     Standing balance support: Bilateral upper extremity supported, During functional activity, Reliant on assistive device for balance Standing balance-Leahy Scale: Poor                             ADL either performed or assessed with clinical judgement   ADL Overall ADL's : At baseline                                             Vision Ability to See in Adequate Light: 0 Adequate       Perception     Praxis      Pertinent Vitals/Pain Pain Assessment Pain Assessment: No/denies pain     Hand Dominance Left   Extremity/Trunk Assessment Upper Extremity Assessment Upper Extremity Assessment: Generalized weakness   Lower Extremity Assessment Lower Extremity Assessment: Defer to PT evaluation   Cervical / Trunk Assessment Cervical / Trunk Assessment: Kyphotic   Communication Communication Communication: No difficulties   Cognition   Behavior During Therapy: WFL for tasks assessed/performed Overall Cognitive Status: No family/caregiver present to determine baseline cognitive functioning  General Comments: decreased sequencing, slow processing     General Comments       Exercises     Shoulder Instructions      Home Living Family/patient expects to be discharged to:: Private residence Living Arrangements: Spouse/significant other Available Help at Discharge: Family;Available 24 hours/day;Personal care attendant Type of Home: House Home Access: Stairs to enter;Ramped entrance Entrance Stairs-Number of Steps: 6 Entrance Stairs-Rails: Right Home Layout: One level     Bathroom Shower/Tub: Walk-in shower;Sponge bathes at baseline   [employer]: [place] - single  point;Rollator (4 wheels);Other (comment);Rolling Walker (2 wheels);Toilet riser   Additional Comments: Nurse from [place] comes 5 days/week to transport pt to HD 3x/week and then on days that pt does not get HD he comes from 9:00-4:30      Prior Functioning/Environment Prior Level of Function : Needs assist             Mobility Comments: Uses bil platform rollator in community, RW in home and intermittently furniture surfs. Fell ~6 months prior. ADLs Comments: Aide helps in household chores, transportation, bathing, grooming, and intermittently with dressing        OT Problem List:        OT Treatment/Interventions:      OT Goals(Current goals can be found in the care plan section)    OT Frequency:      Co-evaluation              AM-PAC OT "6 Clicks" Daily Activity     Outcome Measure Help from another person eating meals?: None Help from another person taking care of personal grooming?: A Little Help from another person toileting, which includes using toliet, bedpan, or urinal?: A Little Help from another person bathing (including washing, rinsing, drying)?: A Lot Help from another person to put on and taking off regular upper body clothing?: A Little Help from another person to put on and taking off regular lower body clothing?: A Lot 6 Click Score: 17   End of Session Equipment Utilized During Treatment: Gait belt;Rolling walker (2 wheels)  Activity Tolerance: Patient tolerated treatment well Patient left: in chair;with call bell/phone within reach;with chair alarm set  OT Visit Diagnosis: Other abnormalities of gait and mobility (R26.89);Unsteadiness on feet (R26.81)                Time: 2725-3664 OT Time Calculation (min): 27 min Charges:  OT General Charges $OT Visit: 1 Visit OT Evaluation $OT Eval Moderate Complexity: 1 Mod OT Treatments $Self Care/Home Management : 8-22 mins  Travis [name], OTR/L Acute Rehabilitation Services Office: [phone]   [name] 07/08/2022, 10:06 AM

## 2022-07-08 NOTE — Discharge Summary (Signed)
Physician Discharge Summary  Travis [name] KCM:[id number] DOB: 12-17-1940 DOA: 07/04/2022  PCP: [name], MD  Admit date: 07/04/2022 Discharge date: 07/08/2022  Time spent: 35 minutes  Recommendations for Outpatient Follow-up:  PCP in 1 week Cardiology Dr. Johney Frame in 1 month  Discharge Diagnoses:  Principal Problem:   Acute on chronic diastolic CHF (congestive heart failure) ([place])   Unstable angina   CAD   Anemia of chronic disease   End stage renal disease on dialysis (HCC)   GERD (gastroesophageal reflux disease)   HTN (hypertension)   Coronary artery disease   Discharge Condition: Stable  Diet recommendation: Renal, heart healthy  Filed Weights   07/07/22 1652 07/07/22 1653 07/08/22 0557  Weight: 71.3 kg 68.4 kg 70.2 kg    History of present illness:  81 yo male with the past medical history of ERSD, pulmonary hypertension, DVT  and chronic anemia who presented with chest pain,. 4 to 5 days of dyspnea and orthopnea. High sensitive troponin 130  Chest radiograph , bilateral hilar vascular congestion, with bilateral interstitial infiltrate Patient underwent HD with improvement in his symptoms.   Hospital Course:   Acute on chronic diastolic CHF  Echo EFd 60 to 65%, mild LVH, RV systolic preserved, mild to moderate TR. -Appears volume overloaded, HD today, lower dry weight as tolerated -Continue with amlodipine, carvedilol, and losartan.  -Ambulate, PT eval today -Discharge planning, if respiratory status status stable after HD   Chest pain, stable angina Coronary artery disease -ECHo w/ preserved EF -Treated with IV heparin, Cardiac catheterization with 2 vessel disease-medical management was recommended -continue ASA/statin/coreg   End stage renal disease on dialysis ([place]) -renal following, for a 2D today, lower dry weight as tolerated   Anemia of chronic renal disease -Slight downtrend in hemoglobin, likely hemodilution contributing as  well Continue with EPO.    GERD (gastroesophageal reflux disease) Continue with pantoprazole, change to po.  Advance diet post cardiac catheterization    HTN (hypertension) Continue blood pressure control with amlodipine, carvedilol and losartan   Procedure 10/24 cardiac catheterization with moderate severe 2 vessel single vessel disease. PCWP 22.  Recommended continue medical therapy and fluid management per HD  Discharge Exam: Vitals:   07/07/22 1954 07/08/22 0557  BP: 123/62 130/70  Pulse: 64 74  Resp: 17 20  Temp: 97.7 F (36.5 C) 98.1 F (36.7 C)  SpO2: 100% 98%   General exam: Appears calm and comfortable, AAOx2 Respiratory system: Few basilar Rales Cardiovascular system: S1 & S2 heard, RRR.  Abd: nondistended, soft and nontender.Normal bowel sounds heard. Central nervous system: Alert and oriented. No focal neurological deficits. Extremities: no edema Skin: No rashes Psychiatry:  Mood & affect appropriate.   Discharge Instructions   Discharge Instructions     Diet - low sodium heart healthy   Complete by: As directed    Increase activity slowly   Complete by: As directed       Allergies as of 07/08/2022   No Active Allergies      Medication List     STOP taking these medications    ibuprofen 200 MG tablet Commonly known as: ADVIL       TAKE these medications    Accu-Chek Guide test strip Generic drug: glucose blood Use to test blood glucose four times daily as directed   Accu-Chek Guide w/Device Kit Use up to four times daily as directed.   Accu-Chek Softclix Lancets lancets Use to test blood glucose four times daily as directed.  amLODipine 10 MG tablet Commonly known as: NORVASC Take 1 tablet (10 mg total) by mouth daily.   aspirin EC 81 MG tablet Take 1 tablet (81 mg total) by mouth daily.   atorvastatin 40 MG tablet Commonly known as: LIPITOR Take 1 tablet (40 mg total) by mouth daily at 6 PM.   atropine 1 % ophthalmic  solution SMARTSIG:In Eye(s)   Brinzolamide-Brimonidine 1-0.2 % Susp Place 1 drop into both eyes in the morning, at noon, and at bedtime.   calcitRIOL 0.25 MCG capsule Commonly known as: ROCALTROL Take 13 capsules (3.25 mcg total) by mouth every Monday, Wednesday, and Friday with hemodialysis.   carvedilol 12.5 MG tablet Commonly known as: COREG Take 1 tablet (12.5 mg total) by mouth 2 (two) times daily with a meal. What changed:  medication strength how much to take   cinacalcet 30 MG tablet Commonly known as: SENSIPAR Take 3 tablets (90 mg total) by mouth every Monday, Wednesday, and Friday with hemodialysis.   ferric gluconate 62.5 mg in sodium chloride 0.9 % 100 mL Inject 62.5 mg into the vein every Wednesday with hemodialysis.   latanoprost 0.005 % ophthalmic solution Commonly known as: XALATAN Place 1 drop into both eyes at bedtime.   losartan 25 MG tablet Commonly known as: COZAAR Take 1 tablet (25 mg total) by mouth at bedtime.   nitroGLYCERIN 0.4 MG SL tablet Commonly known as: NITROSTAT Place 1 tablet (0.4 mg total) under the tongue every 5 (five) minutes as needed for chest pain (for total of 3 doses at the most).   ofloxacin 0.3 % ophthalmic solution Commonly known as: OCUFLOX SMARTSIG:In Eye(s)   omeprazole 40 MG capsule Commonly known as: PRILOSEC Take 40 mg by mouth daily.   OXYGEN Inhale 2 L into the lungs daily.   prednisoLONE acetate 1 % ophthalmic suspension Commonly known as: PRED FORTE SMARTSIG:1 Drop(s) In Eye(s) 6 Times Daily   sevelamer carbonate 800 MG tablet Commonly known as: RENVELA Take 2 tablets (1,600 mg total) by mouth 3 (three) times daily with meals. What changed: Another medication with the same name was removed. Continue taking this medication, and follow the directions you see here.   timolol 0.5 % ophthalmic gel-forming Commonly known as: TIMOPTIC-XR Place 1 drop into both eyes daily.   VITAMIN A PO Take 1 tablet by  mouth daily.   VITAMIN D PO Take 1 tablet by mouth daily.       No Active Allergies  Follow-up Information     [facility] Follow up.   Specialty: Home Health Services Why: Physical and Occupational therapy-office to call with visit times. Contact information: [address] [place] [address] [phone]                  The results of significant diagnostics from this hospitalization (including imaging, microbiology, ancillary and laboratory) are listed below for reference.    Significant Diagnostic Studies: ECHOCARDIOGRAM COMPLETE  Result Date: 07/06/2022    ECHOCARDIOGRAM REPORT   Patient Name:   Travis [name] Date of Exam: 07/06/2022 Medical Rec #:  [id number]       Height:       70.0 in Accession #:    [id number]      Weight:       154.3 lb Date of Birth:  [birth year]       BSA:          1.870 m Patient Age:    81 years  BP:           147/76 mmHg Patient Gender: M               HR:           66 bpm. Exam Location:  Inpatient Procedure: 2D Echo, 3D Echo, Cardiac Doppler and Color Doppler Indications:     NSTEMI I21.4  History:         Patient has prior history of Echocardiogram examinations, most                  recent 11/11/2020. CHF, NSTEMI, Signs/Symptoms:Chest Pain; Risk                  Factors:Hypertension and Non-Smoker.  Sonographer:     Greer Pickerel Referring Phys:  [id number] Lequita Halt Diagnosing Phys: Eleonore Chiquito MD  Sonographer Comments: Suboptimal subcostal window. Image acquisition challenging due to respiratory motion. IMPRESSIONS  1. Left ventricular ejection fraction, by estimation, is 45 to 50%. Left ventricular ejection fraction by 2D MOD biplane is 47.4 %. The left ventricle has mildly decreased function. The left ventricle has no regional wall motion abnormalities. There is moderate asymmetric left ventricular hypertrophy of the infero-lateral segment. Left ventricular diastolic parameters are consistent with Grade II  diastolic dysfunction (pseudonormalization).  2. Right ventricular systolic function is normal. The right ventricular size is normal. Tricuspid regurgitation signal is inadequate for assessing PA pressure.  3. The mitral valve is grossly normal. Mild mitral valve regurgitation. No evidence of mitral stenosis.  4. The aortic valve is tricuspid. There is moderate calcification of the aortic valve. There is moderate thickening of the aortic valve. Aortic valve regurgitation is mild. Mild aortic valve stenosis.  5. The inferior vena cava is normal in size with greater than 50% respiratory variability, suggesting right atrial pressure of 3 mmHg. Comparison(s): Changes from prior study are noted. The left ventricular function is worsened. FINDINGS  Left Ventricle: Left ventricular ejection fraction, by estimation, is 45 to 50%. Left ventricular ejection fraction by 2D MOD biplane is 47.4 %. The left ventricle has mildly decreased function. The left ventricle has no regional wall motion abnormalities. 3D left ventricular ejection fraction analysis performed but not reported based on interpreter judgement due to suboptimal tracking. The left ventricular internal cavity size was normal in size. There is moderate asymmetric left ventricular hypertrophy of the infero-lateral segment. Left ventricular diastolic parameters are consistent with Grade II diastolic dysfunction (pseudonormalization). Right Ventricle: The right ventricular size is normal. No increase in right ventricular wall thickness. Right ventricular systolic function is normal. Tricuspid regurgitation signal is inadequate for assessing PA pressure. Left Atrium: Left atrial size was normal in size. Right Atrium: Right atrial size was normal in size. Pericardium: There is no evidence of pericardial effusion. Mitral Valve: The mitral valve is grossly normal. Mild mitral valve regurgitation. No evidence of mitral valve stenosis. Tricuspid Valve: The tricuspid valve  is grossly normal. Tricuspid valve regurgitation is mild . No evidence of tricuspid stenosis. Aortic Valve: The aortic valve is tricuspid. There is moderate calcification of the aortic valve. There is moderate thickening of the aortic valve. Aortic valve regurgitation is mild. Aortic regurgitation PHT measures 460 msec. Mild aortic stenosis is present. Aortic valve mean gradient measures 15.0 mmHg. Aortic valve peak gradient measures 27.2 mmHg. Aortic valve area, by VTI measures 1.11 cm. Pulmonic Valve: The pulmonic valve was grossly normal. Pulmonic valve regurgitation is trivial. No evidence of pulmonic stenosis. Aorta: The aortic root  and ascending aorta are structurally normal, with no evidence of dilitation. Venous: The inferior vena cava is normal in size with greater than 50% respiratory variability, suggesting right atrial pressure of 3 mmHg. IAS/Shunts: The atrial septum is grossly normal.  LEFT VENTRICLE PLAX 2D                        Biplane EF (MOD) LVIDd:         5.10 cm         LV Biplane EF:   Left LVIDs:         3.90 cm                          ventricular LV PW:         1.80 cm                          ejection LV IVS:        0.60 cm                          fraction by LVOT diam:     1.80 cm                          2D MOD LV SV:         61                               biplane is LV SV Index:   33                               47.4 %. LVOT Area:     2.54 cm                                Diastology                                LV e' medial:    4.57 cm/s LV Volumes (MOD)               LV E/e' medial:  26.9 LV vol d, MOD    121.0 ml      LV e' lateral:   5.98 cm/s A2C:                           LV E/e' lateral: 20.6 LV vol d, MOD    99.0 ml A4C: LV vol s, MOD    64.6 ml A2C: LV vol s, MOD    53.5 ml       3D Volume EF: A4C:                           3D EF:        53 % LV SV MOD A2C:   56.4 ml       LV EDV:       207 ml LV SV MOD A4C:   99.0 ml       LV ESV:  97 ml LV SV MOD BP:    52.7 ml        LV SV:        110 ml RIGHT VENTRICLE RV S prime:     8.92 cm/s TAPSE (M-mode): 2.0 cm LEFT ATRIUM             Index        RIGHT ATRIUM           Index LA diam:        3.60 cm 1.93 cm/m   RA Area:     17.50 cm LA Vol (A2C):   87.0 ml 46.54 ml/m  RA Volume:   46.90 ml  25.09 ml/m LA Vol (A4C):   32.3 ml 17.28 ml/m LA Biplane Vol: 52.7 ml 28.19 ml/m  AORTIC VALVE                     PULMONIC VALVE AV Area (Vmax):    1.13 cm      PR End Diast Vel: 5.02 msec AV Area (Vmean):   1.16 cm AV Area (VTI):     1.11 cm AV Vmax:           261.00 cm/s AV Vmean:          182.500 cm/s AV VTI:            0.546 m AV Peak Grad:      27.2 mmHg AV Mean Grad:      15.0 mmHg LVOT Vmax:         116.00 cm/s LVOT Vmean:        83.300 cm/s LVOT VTI:          0.239 m LVOT/AV VTI ratio: 0.44 AI PHT:            460 msec  AORTA Ao Root diam: 3.90 cm Ao Asc diam:  3.70 cm MITRAL VALVE MV Area (PHT): 3.60 cm     SHUNTS MV Decel Time: 211 msec     Systemic VTI:  0.24 m MV E velocity: 123.00 cm/s  Systemic Diam: 1.80 cm MV A velocity: 75.00 cm/s MV E/A ratio:  1.64 Eleonore Chiquito MD Electronically signed by Eleonore Chiquito MD Signature Date/Time: 07/06/2022/6:51:56 PM    Final (Updated)    CARDIAC CATHETERIZATION  Addendum Date: 07/06/2022     Heavily calcified diffusely diseased vessel: 1st Diag-1 lesion is 99% stenosed. 1st Diag-2 lesion is 60% stenosed. 1st Diag-3 lesion is 95% stenosed.   The left ventricular systolic function is normal. The left ventricular ejection fraction is 50-55% by visual estimate.   LV end diastolic pressure is moderately elevated.   Hemodynamic findings consistent with mild pulmonary hypertension. POSTOP DIAGNOSES Moderate-severe 2-vessel single-vessel disease: Proximal D1-99%, mid 60%, distal 95% (heavily calcified/diffusely diseased; not favorable for PCI would require atherectomy and a relatively small caliber vessel) & 60% ostial sidebranch of OM1 followed by a 60% mid vessel then distal occlusion  (with right to left collaterals). Otherwise mild diffuse disease/calcified vessels Mostly preserved LVEF, cannot exclude anterolateral hypokinesis.  LVEDP moderately elevated at 19 mmHg (LVEDP 166/5 mmHg) with a PCWP of ~22 mmHg. RHC numbers: Mean RAP 6 mmHg, RVP-EDP 51/0-5 mmHg; PAP-mean 50/13-27 mmHg; PCWP 17-26 mmHg (~22 mmHg); Ao sat 99%, PA sat 74%.  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  This is heavily calcified vessel with diffuse disease,  will require atherectomy and extensive stent placement in the vessel is roughly 2 to 2.25 mm distally.  Not favorable for PCI. Consider slightly increased volume removal and HD based on elevated filling pressures. Return to nursing for ongoing care Glenetta Hew, MD   Result Date: 07/06/2022   Heavily calcified diffusely diseased vessel: 1st Diag-1 lesion is 99% stenosed. 1st Diag-2 lesion is 60% stenosed. 1st Diag-3 lesion is 95% stenosed.   The left ventricular systolic function is normal. The left ventricular ejection fraction is 50-55% by visual estimate.   LV end diastolic pressure is moderately elevated.   Hemodynamic findings consistent with mild pulmonary hypertension. POSTOP DIAGNOSES Moderate-severe 2-vessel single-vessel disease: Proximal D1-99%, mid 60%, distal 95% (heavily calcified/diffusely diseased; not favorable for PCI would require atherectomy and a relatively small caliber vessel) & 60% ostial sidebranch of OM1 followed by a 60% mid vessel then distal occlusion (with right to left collaterals). Otherwise mild diffuse disease/calcified vessels Mostly preserved LVEF, cannot exclude anterolateral hypokinesis.  LVEDP moderately elevated at 19 mmHg (LVEDP 166/5 mmHg) with a PCWP of ~22 mmHg. RHC numbers: Mean RAP 6 mmHg, RVP-EDP 51/0-5 mmHg; PAP-mean 50/13-27 mmHg; PCWP 17-26 mmHg (~22 mmHg); Ao sat 99%, PA sat  74%.  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  This is heavily calcified vessel with diffuse disease, will require atherectomy and extensive stent placement in the vessel is roughly 2 to 2.25 mm distally.  Not favorable for PCI. Consider slightly increased volume removal and HD based on elevated filling pressures. Return to nursing for ongoing care Glenetta Hew, MD  CT Head Wo Contrast  Result Date: 07/04/2022 CLINICAL DATA:  Headaches EXAM: CT HEAD WITHOUT CONTRAST TECHNIQUE: Contiguous axial images were obtained from the base of the skull through the vertex without intravenous contrast. RADIATION DOSE REDUCTION: This exam was performed according to the departmental dose-optimization program which includes automated exposure control, adjustment of the mA and/or kV according to patient size and/or use of iterative reconstruction technique. COMPARISON:  Previous studies including the examination of 12/24/2021 FINDINGS: Brain: No acute intracranial findings are seen. There are no signs of bleeding within the cranium. Cortical sulci are prominent. There is decreased density in periventricular and subcortical white matter. Vascular: Unremarkable. Skull: Unremarkable. Sinuses/Orbits: Visualized paranasal sinuses are unremarkable. There is a high density in the lateral and superior aspect of right optic globe suggesting previous intervention. Other: None. IMPRESSION: No acute intracranial findings are seen Atrophy.  Small-vessel disease. Electronically Signed   By: Elmer Picker M.D.   On: 07/04/2022 12:01   DG Chest 2 View  Result Date: 07/04/2022 CLINICAL DATA:  Chest pain EXAM: CHEST - 2 VIEW COMPARISON:  Prior chest x-ray 04/09/2022 FINDINGS: Progressive cardiomegaly. Atherosclerotic calcification present in the transverse aorta. Metal stent projects over the  right innominate vein. Significantly increased diffuse interstitial airspace opacities bilaterally consistent with pulmonary edema. Small bilateral pleural effusions. Fluid is also entrapped within the major fissure on the lateral view. No pneumothorax. No acute osseous abnormality. IMPRESSION: 1. Progressive cardiomegaly, interstitial pulmonary edema and bilateral pleural effusions with fluid entrapped in the fissures. Findings may represent CHF or fluid overload in the setting of dialysis. Electronically Signed   By: Jacqulynn Cadet M.D.   On: 07/04/2022 08:37    Microbiology: No results found for this or any previous visit (from the past 240 hour(s)).   Labs: Basic Metabolic Panel: Recent  Labs  Lab 07/04/22 0755 07/05/22 0215 07/06/22 1147 07/06/22 1150 07/06/22 1153 07/07/22 0602 07/08/22 0314  NA 136 136 137 134* 137 136 134*  K 3.8 4.6 3.9 4.0 3.9 4.3 4.2  CL 92* 94*  --   --   --  94* 93*  CO2 30 27  --   --   --  29 27  GLUCOSE 110* 96  --   --   --  94 99  BUN 27* 38*  --   --   --  34* 32*  CREATININE 9.44* 10.91*  --   --   --  10.76* 9.62*  CALCIUM 9.0 8.7*  --   --   --  9.1 9.2  MG  --  2.1  --   --   --   --   --   PHOS  --  6.5*  --   --   --  6.2*  --    Liver Function Tests: Recent Labs  Lab 07/05/22 0215 07/07/22 0602  AST 14*  --   ALT 7  --   ALKPHOS 69  --   BILITOT 0.7  --   PROT 6.9  --   ALBUMIN 3.0* 2.9*   Recent Labs  Lab 07/05/22 0215  LIPASE 36   No results for input(s): "AMMONIA" in the last 168 hours. CBC: Recent Labs  Lab 07/04/22 0755 07/05/22 0215 07/06/22 0311 07/06/22 1147 07/06/22 1150 07/06/22 1153 07/07/22 0602 07/08/22 0314  WBC 10.1 9.9 8.9  --   --   --  7.3 8.1  HGB 10.0* 8.9* 8.9* 8.5* 8.8* 8.5* 8.2* 8.9*  HCT 31.3* 27.6* 28.0* 25.0* 26.0* 25.0* 26.3* 28.1*  MCV 96.0 93.9 95.2  --   --   --  95.6 94.6  PLT 309 260 282  --   --   --  241 244   Cardiac Enzymes: No results for input(s): "CKTOTAL", "CKMB",  "CKMBINDEX", "TROPONINI" in the last 168 hours. BNP: BNP (last 3 results) Recent Labs    07/05/22 0301  BNP 2,472.8*    ProBNP (last 3 results) No results for input(s): "PROBNP" in the last 8760 hours.  CBG: Recent Labs  Lab 07/05/22 1635 07/06/22 0813  GLUCAP 13* 91       Signed:  Domenic Polite MD.  [facility] 07/08/2022, 12:15 PM

## 2022-07-08 NOTE — Progress Notes (Addendum)
[facility] Progress Note   Subjective:   Patient seen and examined in room.  Sitting up in bedside chair.  Plan for d/c home this AM after his wife returns from breakfast.  States he is breathing better.  Denies CP, SOB, abdominal pain and n/v/d.    Objective Vitals:   07/07/22 1652 07/07/22 1653 07/07/22 1954 07/08/22 0557  BP:   123/62 130/70  Pulse:   64 74  Resp:   17 20  Temp:   97.7 F (36.5 C) 98.1 F (36.7 C)  TempSrc:   Oral Oral  SpO2:   100% 98%  Weight: 71.3 kg 68.4 kg  70.2 kg  Height:       Physical Exam General:WDWN, pleasant male in NAD Heart:RRR, no mrg Lungs:mostly CTAB, faint crackles in LLL Abdomen:soft, NTND Extremities:no LE edema Dialysis Access: RU AVG +b/t   Filed Weights   07/07/22 1652 07/07/22 1653 07/08/22 0557  Weight: 71.3 kg 68.4 kg 70.2 kg    Intake/Output Summary (Last 24 hours) at 07/08/2022 0950 Last data filed at 07/07/2022 1646 Gross per 24 hour  Intake --  Output 2750 ml  Net -2750 ml    Additional Objective Labs: Basic Metabolic Panel: Recent Labs  Lab 07/05/22 0215 07/06/22 1147 07/06/22 1153 07/07/22 0602 07/08/22 0314  NA 136   < > 137 136 134*  K 4.6   < > 3.9 4.3 4.2  CL 94*  --   --  94* 93*  CO2 27  --   --  29 27  GLUCOSE 96  --   --  94 99  BUN 38*  --   --  34* 32*  CREATININE 10.91*  --   --  10.76* 9.62*  CALCIUM 8.7*  --   --  9.1 9.2  PHOS 6.5*  --   --  6.2*  --    < > = values in this interval not displayed.   Liver Function Tests: Recent Labs  Lab 07/05/22 0215 07/07/22 0602  AST 14*  --   ALT 7  --   ALKPHOS 69  --   BILITOT 0.7  --   PROT 6.9  --   ALBUMIN 3.0* 2.9*   Recent Labs  Lab 07/05/22 0215  LIPASE 36   CBC: Recent Labs  Lab 07/04/22 0755 07/05/22 0215 07/06/22 0311 07/06/22 1147 07/06/22 1153 07/07/22 0602 07/08/22 0314  WBC 10.1 9.9 8.9  --   --  7.3 8.1  HGB 10.0* 8.9* 8.9*   < > 8.5* 8.2* 8.9*  HCT 31.3* 27.6* 28.0*   < > 25.0* 26.3* 28.1*   MCV 96.0 93.9 95.2  --   --  95.6 94.6  PLT 309 260 282  --   --  241 244   < > = values in this interval not displayed.   CBG: Recent Labs  Lab 07/05/22 1635 07/06/22 0813  GLUCAP 13* 91   Iron Studies:  Recent Labs    07/07/22 0602  IRON 47  TIBC 202*  FERRITIN 1,310*   Lab Results  Component Value Date   INR 1.1 07/05/2022   INR 1.0 07/04/2022   INR 1.0 12/18/2021   Studies/Results: ECHOCARDIOGRAM COMPLETE  Result Date: 07/06/2022    ECHOCARDIOGRAM REPORT   Patient Name:   Travis [name] Date of Exam: 07/06/2022 Medical Rec #:  [id number]       Height:       70.0 in Accession #:    [id number]  Weight:       154.3 lb Date of Birth:  1941-01-03       BSA:          1.870 m Patient Age:    81 years        BP:           147/76 mmHg Patient Gender: M               HR:           66 bpm. Exam Location:  Inpatient Procedure: 2D Echo, 3D Echo, Cardiac Doppler and Color Doppler Indications:     NSTEMI I21.4  History:         Patient has prior history of Echocardiogram examinations, most                  recent 11/11/2020. CHF, NSTEMI, Signs/Symptoms:Chest Pain; Risk                  Factors:Hypertension and Non-Smoker.  Sonographer:     Greer Pickerel Referring Phys:  [id number] Lequita Halt Diagnosing Phys: Eleonore Chiquito MD  Sonographer Comments: Suboptimal subcostal window. Image acquisition challenging due to respiratory motion. IMPRESSIONS  1. Left ventricular ejection fraction, by estimation, is 45 to 50%. Left ventricular ejection fraction by 2D MOD biplane is 47.4 %. The left ventricle has mildly decreased function. The left ventricle has no regional wall motion abnormalities. There is moderate asymmetric left ventricular hypertrophy of the infero-lateral segment. Left ventricular diastolic parameters are consistent with Grade II diastolic dysfunction (pseudonormalization).  2. Right ventricular systolic function is normal. The right ventricular size is normal. Tricuspid regurgitation  signal is inadequate for assessing PA pressure.  3. The mitral valve is grossly normal. Mild mitral valve regurgitation. No evidence of mitral stenosis.  4. The aortic valve is tricuspid. There is moderate calcification of the aortic valve. There is moderate thickening of the aortic valve. Aortic valve regurgitation is mild. Mild aortic valve stenosis.  5. The inferior vena cava is normal in size with greater than 50% respiratory variability, suggesting right atrial pressure of 3 mmHg. Comparison(s): Changes from prior study are noted. The left ventricular function is worsened. FINDINGS  Left Ventricle: Left ventricular ejection fraction, by estimation, is 45 to 50%. Left ventricular ejection fraction by 2D MOD biplane is 47.4 %. The left ventricle has mildly decreased function. The left ventricle has no regional wall motion abnormalities. 3D left ventricular ejection fraction analysis performed but not reported based on interpreter judgement due to suboptimal tracking. The left ventricular internal cavity size was normal in size. There is moderate asymmetric left ventricular hypertrophy of the infero-lateral segment. Left ventricular diastolic parameters are consistent with Grade II diastolic dysfunction (pseudonormalization). Right Ventricle: The right ventricular size is normal. No increase in right ventricular wall thickness. Right ventricular systolic function is normal. Tricuspid regurgitation signal is inadequate for assessing PA pressure. Left Atrium: Left atrial size was normal in size. Right Atrium: Right atrial size was normal in size. Pericardium: There is no evidence of pericardial effusion. Mitral Valve: The mitral valve is grossly normal. Mild mitral valve regurgitation. No evidence of mitral valve stenosis. Tricuspid Valve: The tricuspid valve is grossly normal. Tricuspid valve regurgitation is mild . No evidence of tricuspid stenosis. Aortic Valve: The aortic valve is tricuspid. There is moderate  calcification of the aortic valve. There is moderate thickening of the aortic valve. Aortic valve regurgitation is mild. Aortic regurgitation PHT measures 460 msec. Mild aortic stenosis  is present. Aortic valve mean gradient measures 15.0 mmHg. Aortic valve peak gradient measures 27.2 mmHg. Aortic valve area, by VTI measures 1.11 cm. Pulmonic Valve: The pulmonic valve was grossly normal. Pulmonic valve regurgitation is trivial. No evidence of pulmonic stenosis. Aorta: The aortic root and ascending aorta are structurally normal, with no evidence of dilitation. Venous: The inferior vena cava is normal in size with greater than 50% respiratory variability, suggesting right atrial pressure of 3 mmHg. IAS/Shunts: The atrial septum is grossly normal.  LEFT VENTRICLE PLAX 2D                        Biplane EF (MOD) LVIDd:         5.10 cm         LV Biplane EF:   Left LVIDs:         3.90 cm                          ventricular LV PW:         1.80 cm                          ejection LV IVS:        0.60 cm                          fraction by LVOT diam:     1.80 cm                          2D MOD LV SV:         61                               biplane is LV SV Index:   33                               47.4 %. LVOT Area:     2.54 cm                                Diastology                                LV e' medial:    4.57 cm/s LV Volumes (MOD)               LV E/e' medial:  26.9 LV vol d, MOD    121.0 ml      LV e' lateral:   5.98 cm/s A2C:                           LV E/e' lateral: 20.6 LV vol d, MOD    99.0 ml A4C: LV vol s, MOD    64.6 ml A2C: LV vol s, MOD    53.5 ml       3D Volume EF: A4C:                           3D EF:        53 %  LV SV MOD A2C:   56.4 ml       LV EDV:       207 ml LV SV MOD A4C:   99.0 ml       LV ESV:       97 ml LV SV MOD BP:    52.7 ml       LV SV:        110 ml RIGHT VENTRICLE RV S prime:     8.92 cm/s TAPSE (M-mode): 2.0 cm LEFT ATRIUM             Index        RIGHT ATRIUM           Index  LA diam:        3.60 cm 1.93 cm/m   RA Area:     17.50 cm LA Vol (A2C):   87.0 ml 46.54 ml/m  RA Volume:   46.90 ml  25.09 ml/m LA Vol (A4C):   32.3 ml 17.28 ml/m LA Biplane Vol: 52.7 ml 28.19 ml/m  AORTIC VALVE                     PULMONIC VALVE AV Area (Vmax):    1.13 cm      PR End Diast Vel: 5.02 msec AV Area (Vmean):   1.16 cm AV Area (VTI):     1.11 cm AV Vmax:           261.00 cm/s AV Vmean:          182.500 cm/s AV VTI:            0.546 m AV Peak Grad:      27.2 mmHg AV Mean Grad:      15.0 mmHg LVOT Vmax:         116.00 cm/s LVOT Vmean:        83.300 cm/s LVOT VTI:          0.239 m LVOT/AV VTI ratio: 0.44 AI PHT:            460 msec  AORTA Ao Root diam: 3.90 cm Ao Asc diam:  3.70 cm MITRAL VALVE MV Area (PHT): 3.60 cm     SHUNTS MV Decel Time: 211 msec     Systemic VTI:  0.24 m MV E velocity: 123.00 cm/s  Systemic Diam: 1.80 cm MV A velocity: 75.00 cm/s MV E/A ratio:  1.64 Eleonore Chiquito MD Electronically signed by Eleonore Chiquito MD Signature Date/Time: 07/06/2022/6:51:56 PM    Final (Updated)    CARDIAC CATHETERIZATION  Addendum Date: 07/06/2022     Heavily calcified diffusely diseased vessel: 1st Diag-1 lesion is 99% stenosed. 1st Diag-2 lesion is 60% stenosed. 1st Diag-3 lesion is 95% stenosed.   The left ventricular systolic function is normal. The left ventricular ejection fraction is 50-55% by visual estimate.   LV end diastolic pressure is moderately elevated.   Hemodynamic findings consistent with mild pulmonary hypertension. POSTOP DIAGNOSES Moderate-severe 2-vessel single-vessel disease: Proximal D1-99%, mid 60%, distal 95% (heavily calcified/diffusely diseased; not favorable for PCI would require atherectomy and a relatively small caliber vessel) & 60% ostial sidebranch of OM1 followed by a 60% mid vessel then distal occlusion (with right to left collaterals). Otherwise mild diffuse disease/calcified vessels Mostly preserved LVEF, cannot exclude anterolateral hypokinesis.  LVEDP  moderately elevated at 19 mmHg (LVEDP 166/5 mmHg) with a PCWP of ~22 mmHg. RHC numbers: Mean RAP 6 mmHg, RVP-EDP 51/0-5 mmHg; PAP-mean 50/13-27 mmHg; PCWP  17-26 mmHg (~22 mmHg); Ao sat 99%, PA sat 74%.  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  This is heavily calcified vessel with diffuse disease, will require atherectomy and extensive stent placement in the vessel is roughly 2 to 2.25 mm distally.  Not favorable for PCI. Consider slightly increased volume removal and HD based on elevated filling pressures. Return to nursing for ongoing care Glenetta Hew, MD   Result Date: 07/06/2022   Heavily calcified diffusely diseased vessel: 1st Diag-1 lesion is 99% stenosed. 1st Diag-2 lesion is 60% stenosed. 1st Diag-3 lesion is 95% stenosed.   The left ventricular systolic function is normal. The left ventricular ejection fraction is 50-55% by visual estimate.   LV end diastolic pressure is moderately elevated.   Hemodynamic findings consistent with mild pulmonary hypertension. POSTOP DIAGNOSES Moderate-severe 2-vessel single-vessel disease: Proximal D1-99%, mid 60%, distal 95% (heavily calcified/diffusely diseased; not favorable for PCI would require atherectomy and a relatively small caliber vessel) & 60% ostial sidebranch of OM1 followed by a 60% mid vessel then distal occlusion (with right to left collaterals). Otherwise mild diffuse disease/calcified vessels Mostly preserved LVEF, cannot exclude anterolateral hypokinesis.  LVEDP moderately elevated at 19 mmHg (LVEDP 166/5 mmHg) with a PCWP of ~22 mmHg. RHC numbers: Mean RAP 6 mmHg, RVP-EDP 51/0-5 mmHg; PAP-mean 50/13-27 mmHg; PCWP 17-26 mmHg (~22 mmHg); Ao sat 99%, PA sat 74%.  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  This is heavily calcified vessel with diffuse disease, will require atherectomy and extensive stent placement in the vessel is roughly 2 to 2.25 mm distally.  Not favorable for PCI. Consider slightly increased volume removal and HD based on elevated filling pressures. Return to nursing for ongoing care Glenetta Hew, MD   Medications:  sodium chloride     albumin human      amLODipine  10 mg Oral Daily   aspirin  81 mg Oral Daily   atorvastatin  40 mg Oral q1800   calcitRIOL  2.75 mcg Oral Q M,W,F-HD   carvedilol  12.5 mg Oral BID WC   Chlorhexidine Gluconate Cloth  6 each Topical Q0600   cinacalcet  90 mg Oral Q M,W,F-HD   darbepoetin (ARANESP) injection - DIALYSIS  40 mcg Intravenous Q Mon-HD   heparin injection (subcutaneous)  5,000 Units Subcutaneous Q8H   losartan  25 mg Oral QHS   pantoprazole  40 mg Oral Daily   sevelamer carbonate  3,200 mg Oral TID WC   sodium chloride flush  3 mL Intravenous Q12H    Dialysis Orders: MWF - [facility]  3.5hrs, BFR 400, DFR 1.5,  EDW 71.8, 2K/ 2Ca UFP 2   Access: RU AVG  Heparin none Mircera 30 mcg q2wks - last 10/4 Calcitriol 2.75 mcg PO qHD Sensipar 90mg  qHD     Assessment/Plan:  Pulmonary edema/Volume overload - interstitial edema noted on CXR.  Per weights close to dry.  Likely weight loss recently and needs lowering. Net UF 2L with HD yesterday.  Elevated filling pressure on RHC, will continue to titrate down volume as tolerated.  Standing weight post HD ~68.4k g- lower dry. Chest pain- RHC & LHC 10/24. Moderate to severe 2 vessel, single vessel disease with medical management recommended.  Not favorable for PCI.  Elevated filling pressures on  RHC. Per cardiology.   ESRD -  on HD MWF. HD tomorrow at outpatient center.   Hypertension - BP in goal. Not on antihypertensives at home.  Takes midodrine pre HD for BP support. Cardiology started carvedilol 12.5mg  BID, amlodipine 10mg  qd and losartan 25mg  qd.  Anemia  of CKD - Hgb 8.9 - Aranesp 23mcg given with HD 07/05/22  Secondary Hyperparathyroidism -  Ca in goal. Last Phos elevated.  Continue binders, sensipar and calcitriol  Nutrition - Renal diet w/fluid restrictions Chronic diastolic HF - volume management w/HD. Mostly preserved EF with elevated filling pressures on HC. Dispo - plan to d/c this AM  Jen Mow, PA-C [facility] 07/08/2022,9:50 AM  LOS: 4 days   Agree with plans.  Patient not physically seen - he was discharged prior to my arrival to the floor.  No charge   Claudia Desanctis, MD 11:49 AM 07/08/2022

## 2022-07-08 NOTE — Progress Notes (Signed)
Pt d/c today. Contacted [facility] to make clinic aware pt was d/c today and will resume care tomorrow.   Melven Sartorius Renal Navigator [phone]

## 2022-07-08 NOTE — Progress Notes (Signed)
PT Cancellation Note  Patient Details Name: Travis [name] MRN: [id number] DOB: 1941/05/14   Cancelled Treatment:    Reason Eval/Treat Not Completed: Patient declined, no reason specified. Pt politely declining PT at this time as he had already ambulated down the hall with mobility team and worked with OT this morning. Pt is currently awaiting d/c to home today and reports no questions or concerns he needs addressed by PT at this time. If pt remains in the hospital, will plan to follow-up another day.    Moishe Spice, PT, DPT Acute Rehabilitation Services  Office: [place] 07/08/2022, 9:54 AM

## 2022-07-15 ENCOUNTER — Ambulatory Visit: Payer: Medicare Other | Attending: Interventional Cardiology

## 2022-07-15 DIAGNOSIS — J069 Acute upper respiratory infection, unspecified: Secondary | ICD-10-CM | POA: Diagnosis not present

## 2022-07-15 DIAGNOSIS — E785 Hyperlipidemia, unspecified: Secondary | ICD-10-CM

## 2022-07-15 LAB — LIPID PANEL
Chol/HDL Ratio: 2.8 ratio (ref 0.0–5.0)
Cholesterol, Total: 153 mg/dL (ref 100–199)
HDL: 55 mg/dL (ref >39)
LDL Chol Calc (NIH): 77 mg/dL (ref 0–99)
Triglycerides: 115 mg/dL (ref 0–149)
VLDL Cholesterol Cal: 21 mg/dL (ref 5–40)

## 2022-07-16 ENCOUNTER — Telehealth: Payer: Self-pay | Admitting: *Deleted

## 2022-07-16 ENCOUNTER — Ambulatory Visit: Payer: No Typology Code available for payment source | Admitting: Nurse Practitioner

## 2022-07-16 NOTE — Telephone Encounter (Signed)
This was sent to Christen Bame, NP who sent this lab to Dr. Tamala Julian.  Dr. Tamala Julian responded does not see this pt and needs to be assigned to another doctor.  Dr. Tamala Julian goes to church with this pt. Sharyn Lull has never seen him.  Please call pt with new provider.

## 2022-07-23 ENCOUNTER — Encounter: Payer: Self-pay | Admitting: Cardiology

## 2022-07-23 LAB — PROINSULIN/INSULIN RATIO
Insulin: 45 u[IU]/mL — ABNORMAL HIGH
Insulin: 7 u[IU]/mL
Proinsulin: 20.6 pmol/L — ABNORMAL HIGH
Proinsulin: 34.8 pmol/L — ABNORMAL HIGH

## 2022-08-02 ENCOUNTER — Emergency Department (HOSPITAL_COMMUNITY): Payer: No Typology Code available for payment source

## 2022-08-02 ENCOUNTER — Inpatient Hospital Stay (HOSPITAL_COMMUNITY): Payer: No Typology Code available for payment source

## 2022-08-02 ENCOUNTER — Inpatient Hospital Stay (HOSPITAL_COMMUNITY)
Admission: EM | Admit: 2022-08-02 | Discharge: 2022-08-05 | DRG: 291 | Disposition: A | Discharge status: Home Health | Payer: No Typology Code available for payment source | Attending: Family Medicine | Admitting: Family Medicine

## 2022-08-02 DIAGNOSIS — G9341 Metabolic encephalopathy: Secondary | ICD-10-CM | POA: Diagnosis present

## 2022-08-02 DIAGNOSIS — Z79899 Other long term (current) drug therapy: Secondary | ICD-10-CM

## 2022-08-02 DIAGNOSIS — I2510 Atherosclerotic heart disease of native coronary artery without angina pectoris: Secondary | ICD-10-CM | POA: Diagnosis present

## 2022-08-02 DIAGNOSIS — J101 Influenza due to other identified influenza virus with other respiratory manifestations: Secondary | ICD-10-CM | POA: Diagnosis present

## 2022-08-02 DIAGNOSIS — E11649 Type 2 diabetes mellitus with hypoglycemia without coma: Secondary | ICD-10-CM | POA: Diagnosis present

## 2022-08-02 DIAGNOSIS — F0390 Unspecified dementia without behavioral disturbance: Secondary | ICD-10-CM | POA: Diagnosis present

## 2022-08-02 DIAGNOSIS — J440 Chronic obstructive pulmonary disease with acute lower respiratory infection: Secondary | ICD-10-CM | POA: Diagnosis present

## 2022-08-02 DIAGNOSIS — K219 Gastro-esophageal reflux disease without esophagitis: Secondary | ICD-10-CM | POA: Diagnosis present

## 2022-08-02 DIAGNOSIS — R7401 Elevation of levels of liver transaminase levels: Secondary | ICD-10-CM | POA: Diagnosis present

## 2022-08-02 DIAGNOSIS — N186 End stage renal disease: Secondary | ICD-10-CM | POA: Diagnosis present

## 2022-08-02 DIAGNOSIS — N179 Acute kidney failure, unspecified: Secondary | ICD-10-CM | POA: Diagnosis present

## 2022-08-02 DIAGNOSIS — I272 Pulmonary hypertension, unspecified: Secondary | ICD-10-CM | POA: Diagnosis present

## 2022-08-02 DIAGNOSIS — I252 Old myocardial infarction: Secondary | ICD-10-CM

## 2022-08-02 DIAGNOSIS — E162 Hypoglycemia, unspecified: Secondary | ICD-10-CM

## 2022-08-02 DIAGNOSIS — I161 Hypertensive emergency: Secondary | ICD-10-CM | POA: Diagnosis present

## 2022-08-02 DIAGNOSIS — E785 Hyperlipidemia, unspecified: Secondary | ICD-10-CM | POA: Diagnosis present

## 2022-08-02 DIAGNOSIS — Z91158 Patient's noncompliance with renal dialysis for other reason: Secondary | ICD-10-CM | POA: Diagnosis not present

## 2022-08-02 DIAGNOSIS — I5021 Acute systolic (congestive) heart failure: Secondary | ICD-10-CM | POA: Diagnosis present

## 2022-08-02 DIAGNOSIS — H409 Unspecified glaucoma: Secondary | ICD-10-CM | POA: Diagnosis present

## 2022-08-02 DIAGNOSIS — Z992 Dependence on renal dialysis: Secondary | ICD-10-CM

## 2022-08-02 DIAGNOSIS — Z1152 Encounter for screening for COVID-19: Secondary | ICD-10-CM

## 2022-08-02 DIAGNOSIS — I132 Hypertensive heart and chronic kidney disease with heart failure and with stage 5 chronic kidney disease, or end stage renal disease: Secondary | ICD-10-CM | POA: Diagnosis present

## 2022-08-02 DIAGNOSIS — I5023 Acute on chronic systolic (congestive) heart failure: Secondary | ICD-10-CM | POA: Diagnosis present

## 2022-08-02 DIAGNOSIS — Z86718 Personal history of other venous thrombosis and embolism: Secondary | ICD-10-CM

## 2022-08-02 DIAGNOSIS — J9621 Acute and chronic respiratory failure with hypoxia: Secondary | ICD-10-CM | POA: Diagnosis present

## 2022-08-02 DIAGNOSIS — Z8249 Family history of ischemic heart disease and other diseases of the circulatory system: Secondary | ICD-10-CM

## 2022-08-02 DIAGNOSIS — M3500 Sicca syndrome, unspecified: Secondary | ICD-10-CM | POA: Diagnosis present

## 2022-08-02 DIAGNOSIS — J810 Acute pulmonary edema: Principal | ICD-10-CM

## 2022-08-02 DIAGNOSIS — I674 Hypertensive encephalopathy: Secondary | ICD-10-CM | POA: Diagnosis present

## 2022-08-02 DIAGNOSIS — J690 Pneumonitis due to inhalation of food and vomit: Secondary | ICD-10-CM | POA: Diagnosis present

## 2022-08-02 DIAGNOSIS — D631 Anemia in chronic kidney disease: Secondary | ICD-10-CM | POA: Diagnosis present

## 2022-08-02 DIAGNOSIS — J8410 Pulmonary fibrosis, unspecified: Secondary | ICD-10-CM | POA: Diagnosis present

## 2022-08-02 DIAGNOSIS — D649 Anemia, unspecified: Secondary | ICD-10-CM

## 2022-08-02 DIAGNOSIS — I509 Heart failure, unspecified: Secondary | ICD-10-CM

## 2022-08-02 DIAGNOSIS — E875 Hyperkalemia: Secondary | ICD-10-CM | POA: Diagnosis present

## 2022-08-02 DIAGNOSIS — Z7982 Long term (current) use of aspirin: Secondary | ICD-10-CM

## 2022-08-02 DIAGNOSIS — E1122 Type 2 diabetes mellitus with diabetic chronic kidney disease: Secondary | ICD-10-CM | POA: Diagnosis present

## 2022-08-02 LAB — COMPREHENSIVE METABOLIC PANEL
ALT: 86 U/L — ABNORMAL HIGH (ref 0–44)
AST: 127 U/L — ABNORMAL HIGH (ref 15–41)
Albumin: 3 g/dL — ABNORMAL LOW (ref 3.5–5.0)
Alkaline Phosphatase: 68 U/L (ref 38–126)
Anion gap: 19 — ABNORMAL HIGH (ref 5–15)
BUN: 53 mg/dL — ABNORMAL HIGH (ref 8–23)
CO2: 23 mmol/L (ref 22–32)
Calcium: 8.6 mg/dL — ABNORMAL LOW (ref 8.9–10.3)
Chloride: 95 mmol/L — ABNORMAL LOW (ref 98–111)
Creatinine, Ser: 12.41 mg/dL — ABNORMAL HIGH (ref 0.61–1.24)
GFR, Estimated: 4 mL/min — ABNORMAL LOW (ref >60)
Glucose, Bld: 94 mg/dL (ref 70–99)
Potassium: 7.1 mmol/L (ref 3.5–5.1)
Sodium: 137 mmol/L (ref 135–145)
Total Bilirubin: 0.8 mg/dL (ref 0.3–1.2)
Total Protein: 7.5 g/dL (ref 6.5–8.1)

## 2022-08-02 LAB — CBC WITH DIFFERENTIAL/PLATELET
Abs Immature Granulocytes: 0.04 10*3/uL (ref 0.00–0.07)
Basophils Absolute: 0 10*3/uL (ref 0.0–0.1)
Basophils Relative: 0 %
Eosinophils Absolute: 0 10*3/uL (ref 0.0–0.5)
Eosinophils Relative: 0 %
HCT: 29.7 % — ABNORMAL LOW (ref 39.0–52.0)
Hemoglobin: 8.6 g/dL — ABNORMAL LOW (ref 13.0–17.0)
Immature Granulocytes: 1 %
Lymphocytes Relative: 8 %
Lymphs Abs: 0.6 10*3/uL — ABNORMAL LOW (ref 0.7–4.0)
MCH: 28.9 pg (ref 26.0–34.0)
MCHC: 29 g/dL — ABNORMAL LOW (ref 30.0–36.0)
MCV: 99.7 fL (ref 80.0–100.0)
Monocytes Absolute: 1.1 10*3/uL — ABNORMAL HIGH (ref 0.1–1.0)
Monocytes Relative: 15 %
Neutro Abs: 5.4 10*3/uL (ref 1.7–7.7)
Neutrophils Relative %: 76 %
Platelets: 174 10*3/uL (ref 150–400)
RBC: 2.98 MIL/uL — ABNORMAL LOW (ref 4.22–5.81)
RDW: 16.1 % — ABNORMAL HIGH (ref 11.5–15.5)
WBC: 7.1 10*3/uL (ref 4.0–10.5)
nRBC: 0 % (ref 0.0–0.2)

## 2022-08-02 LAB — I-STAT CHEM 8, ED
BUN: 49 mg/dL — ABNORMAL HIGH (ref 8–23)
Calcium, Ion: 0.88 mmol/L — CL (ref 1.15–1.40)
Chloride: 100 mmol/L (ref 98–111)
Creatinine, Ser: 13.9 mg/dL — ABNORMAL HIGH (ref 0.61–1.24)
Glucose, Bld: 97 mg/dL (ref 70–99)
HCT: 30 % — ABNORMAL LOW (ref 39.0–52.0)
Hemoglobin: 10.2 g/dL — ABNORMAL LOW (ref 13.0–17.0)
Potassium: 6.8 mmol/L (ref 3.5–5.1)
Sodium: 133 mmol/L — ABNORMAL LOW (ref 135–145)
TCO2: 24 mmol/L (ref 22–32)

## 2022-08-02 LAB — CBG MONITORING, ED
Glucose-Capillary: 35 mg/dL — CL (ref 70–99)
Glucose-Capillary: 44 mg/dL — CL (ref 70–99)
Glucose-Capillary: 108 mg/dL — ABNORMAL HIGH (ref 70–99)

## 2022-08-02 LAB — RESP PANEL BY RT-PCR (FLU A&B, COVID) ARPGX2
Influenza A by PCR: POSITIVE — AB
Influenza B by PCR: NEGATIVE
SARS Coronavirus 2 by RT PCR: NEGATIVE

## 2022-08-02 LAB — TROPONIN I (HIGH SENSITIVITY): Troponin I (High Sensitivity): 160 ng/L (ref <18)

## 2022-08-02 LAB — LACTIC ACID, PLASMA: Lactic Acid, Venous: 2.6 mmol/L (ref 0.5–1.9)

## 2022-08-02 LAB — MRSA NEXT GEN BY PCR, NASAL: MRSA by PCR Next Gen: NOT DETECTED

## 2022-08-02 LAB — GLUCOSE, CAPILLARY: Glucose-Capillary: 79 mg/dL (ref 70–99)

## 2022-08-02 MED ORDER — SODIUM BICARBONATE 8.4 % IV SOLN
50.0000 meq | Freq: Once | INTRAVENOUS | Status: AC
  Administered 2022-08-02: 50 meq via INTRAVENOUS
  Filled 2022-08-02: qty 50

## 2022-08-02 MED ORDER — INSULIN ASPART 100 UNIT/ML IV SOLN: 10.0000 [IU] | Freq: Once | INTRAVENOUS | Status: DC

## 2022-08-02 MED ORDER — PANTOPRAZOLE SODIUM 40 MG PO TBEC
40.0000 mg | DELAYED_RELEASE_TABLET | Freq: Every day | ORAL | Status: DC
  Administered 2022-08-03 – 2022-08-05 (×3): 40 mg via ORAL
  Filled 2022-08-02 (×3): qty 1

## 2022-08-02 MED ORDER — TIMOLOL MALEATE 0.5 % OP SOLG
1.0000 [drp] | Freq: Every day | OPHTHALMIC | Status: DC
  Administered 2022-08-03 – 2022-08-05 (×3): 1 [drp] via OPHTHALMIC
  Filled 2022-08-02: qty 5

## 2022-08-02 MED ORDER — OFLOXACIN 0.3 % OP SOLN
1.0000 [drp] | Freq: Four times a day (QID) | OPHTHALMIC | Status: DC
  Administered 2022-08-02 – 2022-08-05 (×9): 1 [drp] via OPHTHALMIC
  Filled 2022-08-02: qty 5

## 2022-08-02 MED ORDER — LATANOPROST 0.005 % OP SOLN
1.0000 [drp] | Freq: Every day | OPHTHALMIC | Status: DC
  Administered 2022-08-02 – 2022-08-04 (×3): 1 [drp] via OPHTHALMIC
  Filled 2022-08-02: qty 2.5

## 2022-08-02 MED ORDER — AMLODIPINE BESYLATE 10 MG PO TABS
10.0000 mg | ORAL_TABLET | Freq: Every day | ORAL | Status: DC
  Administered 2022-08-04 – 2022-08-05 (×2): 10 mg via ORAL
  Filled 2022-08-02 (×3): qty 1

## 2022-08-02 MED ORDER — HEPARIN SODIUM (PORCINE) 5000 UNIT/ML IJ SOLN
5000.0000 [IU] | Freq: Two times a day (BID) | INTRAMUSCULAR | Status: DC
  Administered 2022-08-02 – 2022-08-05 (×6): 5000 [IU] via SUBCUTANEOUS
  Filled 2022-08-02 (×6): qty 1

## 2022-08-02 MED ORDER — ATROPINE SULFATE 1 % OP SOLN
1.0000 [drp] | Freq: Three times a day (TID) | OPHTHALMIC | Status: DC
  Administered 2022-08-02 – 2022-08-05 (×8): 1 [drp] via OPHTHALMIC
  Filled 2022-08-02: qty 2

## 2022-08-02 MED ORDER — SEVELAMER CARBONATE 800 MG PO TABS
1600.0000 mg | ORAL_TABLET | Freq: Three times a day (TID) | ORAL | Status: DC
  Administered 2022-08-03 – 2022-08-05 (×6): 1600 mg via ORAL
  Filled 2022-08-02 (×5): qty 2

## 2022-08-02 MED ORDER — SODIUM ZIRCONIUM CYCLOSILICATE 10 G PO PACK
10.0000 g | PACK | Freq: Once | ORAL | Status: AC
  Administered 2022-08-02: 10 g via ORAL
  Filled 2022-08-02: qty 1

## 2022-08-02 MED ORDER — BRINZOLAMIDE 1 % OP SUSP: 1.0000 [drp] | Freq: Three times a day (TID) | OPHTHALMIC | Status: DC

## 2022-08-02 MED ORDER — DEXTROSE 50 % IV SOLN: 1.0000 | Freq: Once | INTRAVENOUS | Status: DC

## 2022-08-02 MED ORDER — DEXTROSE 50 % IV SOLN
INTRAVENOUS | Status: AC
  Administered 2022-08-02: 50 mL
  Filled 2022-08-02: qty 50

## 2022-08-02 MED ORDER — LABETALOL HCL 5 MG/ML IV SOLN
20.0000 mg | Freq: Once | INTRAVENOUS | Status: AC
  Administered 2022-08-02: 20 mg via INTRAVENOUS
  Filled 2022-08-02: qty 4

## 2022-08-02 MED ORDER — DEXTROSE 50 % IV SOLN: 1.0000 | Freq: Once | INTRAVENOUS | Status: AC

## 2022-08-02 MED ORDER — FUROSEMIDE 10 MG/ML IJ SOLN
100.0000 mg | Freq: Once | INTRAVENOUS | Status: DC
  Filled 2022-08-02: qty 10

## 2022-08-02 MED ORDER — DEXTROSE 50 % IV SOLN
1.0000 | Freq: Once | INTRAVENOUS | Status: AC
  Administered 2022-08-02: 50 mL via INTRAVENOUS
  Filled 2022-08-02: qty 50

## 2022-08-02 MED ORDER — DEXTROSE 10 % IV SOLN: INTRAVENOUS | Status: DC

## 2022-08-02 MED ORDER — ATORVASTATIN CALCIUM 40 MG PO TABS: 40.0000 mg | ORAL_TABLET | Freq: Every day | ORAL | Status: DC

## 2022-08-02 MED ORDER — ASPIRIN 81 MG PO TBEC
81.0000 mg | DELAYED_RELEASE_TABLET | Freq: Every day | ORAL | Status: DC
  Administered 2022-08-03 – 2022-08-05 (×3): 81 mg via ORAL
  Filled 2022-08-02 (×3): qty 1

## 2022-08-02 MED ORDER — CALCIUM GLUCONATE 10 % IV SOLN: 1.0000 g | Freq: Once | INTRAVENOUS | Status: DC

## 2022-08-02 MED ORDER — HYDRALAZINE HCL 20 MG/ML IJ SOLN: 5.0000 mg | Freq: Four times a day (QID) | INTRAMUSCULAR | Status: DC | PRN

## 2022-08-02 MED ORDER — CARVEDILOL 12.5 MG PO TABS
12.5000 mg | ORAL_TABLET | Freq: Two times a day (BID) | ORAL | Status: DC
  Administered 2022-08-03: 12.5 mg via ORAL
  Filled 2022-08-02 (×2): qty 1

## 2022-08-02 MED ORDER — BRINZOLAMIDE 1 % OP SUSP
1.0000 [drp] | Freq: Three times a day (TID) | OPHTHALMIC | Status: DC
  Administered 2022-08-02 – 2022-08-05 (×8): 1 [drp] via OPHTHALMIC
  Filled 2022-08-02: qty 10

## 2022-08-02 MED ORDER — BRIMONIDINE TARTRATE 0.2 % OP SOLN
1.0000 [drp] | Freq: Three times a day (TID) | OPHTHALMIC | Status: DC
  Administered 2022-08-02 – 2022-08-05 (×8): 1 [drp] via OPHTHALMIC
  Filled 2022-08-02: qty 5

## 2022-08-02 MED ORDER — PREDNISOLONE ACETATE 1 % OP SUSP
1.0000 [drp] | OPHTHALMIC | Status: DC
  Administered 2022-08-02 – 2022-08-05 (×15): 1 [drp] via OPHTHALMIC
  Filled 2022-08-02: qty 5

## 2022-08-02 MED ORDER — CALCIUM GLUCONATE 10 % IV SOLN
1.0000 g | Freq: Once | INTRAVENOUS | Status: AC
  Administered 2022-08-02: 1 g via INTRAVENOUS
  Filled 2022-08-02: qty 10

## 2022-08-02 MED ORDER — CHLORHEXIDINE GLUCONATE CLOTH 2 % EX PADS
6.0000 | MEDICATED_PAD | Freq: Every day | CUTANEOUS | Status: DC
  Administered 2022-08-03 – 2022-08-05 (×3): 6 via TOPICAL

## 2022-08-02 NOTE — ED Provider Notes (Signed)
[facility] EMERGENCY DEPARTMENT Provider Note   CSN: [id number] Arrival date & time: 08/02/22  1319     History  Chief Complaint  Patient presents with   Altered Mental Status   Cough    Travis [name] is a 81 y.o. male.  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  Pt has not been feeling well for the past few days.  He missed dialysis on Saturday (11/18) due to weakness, so he has not had it since the 16th. Pt lives with his wife of 57 years.  She called EMS today due to confusion.  Pt admitted from 10/20-26 for CHF exac.       Home Medications Prior to Admission medications   Medication Sig Start Date End Date Taking? Authorizing Provider  Accu-Chek Softclix Lancets lancets Use to test blood glucose four times daily as directed. 05/26/21   Thurnell Lose, MD  amLODipine (NORVASC) 10 MG tablet Take 1 tablet (10 mg total) by mouth daily. 05/26/21   Thurnell Lose, MD  aspirin EC 81 MG EC tablet Take 1 tablet (81 mg total) by mouth daily. 04/23/15   Reyne Dumas, MD  atorvastatin (LIPITOR) 40 MG tablet Take 1 tablet (40 mg total) by mouth daily at 6 PM. 04/23/15   Reyne Dumas, MD  atropine 1 % ophthalmic solution SMARTSIG:In Eye(s) 06/23/22   [provider]  Blood Glucose Monitoring Suppl (BLOOD GLUCOSE MONITOR SYSTEM) w/Device KIT Use up to four times daily as directed. 05/26/21   Thurnell Lose, MD  Brinzolamide-Brimonidine 1-0.2 % SUSP Place 1 drop into both eyes in the morning, at noon, and at bedtime. 11/03/21   [provider]  calcitRIOL (ROCALTROL) 0.25 MCG capsule Take 13 capsules (3.25 mcg total) by mouth every Monday, Wednesday, and Friday with hemodialysis. 12/25/21   Cherene Altes, MD  carvedilol (COREG) 12.5 MG tablet Take 1 tablet (12.5 mg total) by mouth 2 (two)  times daily with a meal. 07/08/22   Domenic Polite, MD  cinacalcet (SENSIPAR) 30 MG tablet Take 3 tablets (90 mg total) by mouth every Monday, Wednesday, and Friday with hemodialysis. 12/25/21   Cherene Altes, MD  ferric gluconate 62.5 mg in sodium chloride 0.9 % 100 mL Inject 62.5 mg into the vein every Wednesday with hemodialysis. 12/30/21   Cherene Altes, MD  glucose blood test strip Use to test blood glucose four times daily as directed 05/26/21   Thurnell Lose, MD  latanoprost (XALATAN) 0.005 % ophthalmic solution Place 1 drop into both eyes at bedtime. 05/05/21   [provider]  losartan (COZAAR) 25 MG tablet Take 1 tablet (25 mg total) by mouth at bedtime. Patient not taking: Reported on 07/05/2022 12/25/21   Cherene Altes, MD  nitroGLYCERIN (NITROSTAT) 0.4 MG SL tablet Place 1 tablet (0.4 mg total) under the tongue every 5 (five) minutes as needed for chest pain (for total of 3 doses at the most). 07/08/22   Domenic Polite, MD  ofloxacin (OCUFLOX) 0.3 % ophthalmic solution SMARTSIG:In Eye(s) 06/23/22   [provider]  omeprazole (PRILOSEC) 40 MG capsule Take 40 mg by mouth daily. 07/31/15   [provider]  OXYGEN Inhale 2 L into the lungs daily.    [provider]  prednisoLONE acetate (PRED FORTE) 1 % ophthalmic suspension SMARTSIG:1 Drop(s) In  Eye(s) 6 Times Daily 06/23/22   [provider]  sevelamer carbonate (RENVELA) 800 MG tablet Take 2 tablets (1,600 mg total) by mouth 3 (three) times daily with meals. 05/17/15   Cherene Altes, MD  timolol (TIMOPTIC-XR) 0.5 % ophthalmic gel-forming Place 1 drop into both eyes daily. 11/03/21   [provider]  VITAMIN A PO Take 1 tablet by mouth daily.    [provider]  VITAMIN D PO Take 1 tablet by mouth daily.    [provider]      Allergies    Patient has no known allergies.    Review of Systems   Review of Systems  Neurological:  Positive for  weakness.  Psychiatric/Behavioral:  Positive for confusion.   All other systems reviewed and are negative.   Physical Exam Updated Vital Signs BP (!) 195/97   Pulse 93   Temp 98.3 F (36.8 C) (Oral)   Resp (!) 32   Ht _0  (1.778 m)   Wt 70.2 kg   SpO2 99%   BMI 22.21 kg/m  Physical Exam Vitals and nursing note reviewed.  Constitutional:      Appearance: Normal appearance.  HENT:     Head: Normocephalic and atraumatic.     Right Ear: External ear normal.     Left Ear: External ear normal.     Nose: Nose normal.     Mouth/Throat:     Mouth: Mucous membranes are dry.  Eyes:     Extraocular Movements: Extraocular movements intact.     Conjunctiva/sclera: Conjunctivae normal.     Pupils: Pupils are equal, round, and reactive to light.  Cardiovascular:     Rate and Rhythm: Normal rate and regular rhythm.     Pulses: Normal pulses.     Heart sounds: Normal heart sounds.  Pulmonary:     Effort: Tachypnea present.     Breath sounds: Rhonchi present.  Abdominal:     General: Abdomen is flat. Bowel sounds are normal.     Palpations: Abdomen is soft.  Musculoskeletal:     Cervical back: Normal range of motion and neck supple.     Comments: RUE +AV fistula with good thrill.  Skin:    General: Skin is warm.     Capillary Refill: Capillary refill takes less than 2 seconds.  Neurological:     Mental Status: He is oriented to person, place, and time.     Comments: Pt is slow to respond, but does respond appropriately  Psychiatric:        Mood and Affect: Mood normal.        Behavior: Behavior normal.     ED Results / Procedures / Treatments   Labs (all labs ordered are listed, but only abnormal results are displayed) Labs Reviewed  CBC WITH DIFFERENTIAL/PLATELET - Abnormal; Notable for the following components:      Result Value   RBC 2.98 (*)    Hemoglobin 8.6 (*)    HCT 29.7 (*)    MCHC 29.0 (*)    RDW 16.1 (*)    Lymphs Abs 0.6 (*)    Monocytes Absolute 1.1  (*)    All other components within normal limits  LACTIC ACID, PLASMA - Abnormal; Notable for the following components:   Lactic Acid, Venous 2.6 (*)    All other components within normal limits  CBG MONITORING, ED - Abnormal; Notable for the following components:   Glucose-Capillary 44 (*)    All other components  within normal limits  I-STAT CHEM 8, ED - Abnormal; Notable for the following components:   Sodium 133 (*)    Potassium 6.8 (*)    BUN 49 (*)    Creatinine, Ser 13.90 (*)    Calcium, Ion 0.88 (*)    Hemoglobin 10.2 (*)    HCT 30.0 (*)    All other components within normal limits  CBG MONITORING, ED - Abnormal; Notable for the following components:   Glucose-Capillary 35 (*)    All other components within normal limits  CBG MONITORING, ED - Abnormal; Notable for the following components:   Glucose-Capillary 108 (*)    All other components within normal limits  RESP PANEL BY RT-PCR (FLU A&B, COVID) ARPGX2  COMPREHENSIVE METABOLIC PANEL  URINALYSIS, ROUTINE W REFLEX MICROSCOPIC  HEPATITIS B SURFACE ANTIGEN  HEPATITIS B SURFACE ANTIBODY, QUANTITATIVE  TROPONIN I (HIGH SENSITIVITY)    EKG EKG Interpretation  Date/Time:  Monday August 02 2022 13:28:47 EST Ventricular Rate:  87 PR Interval:  241 QRS Duration: 142 QT Interval:  414 QTC Calculation: 499 R Axis:   -65 Text Interpretation: Sinus rhythm Prolonged PR interval LVH with IVCD, LAD and secondary repol abnrm Borderline prolonged QT interval peaked t waves and t wave inversions Confirmed by Isla Pence [phone]) on 08/02/2022 1:32:41 PM  Radiology DG Chest Portable 1 View  Result Date: 08/02/2022 CLINICAL DATA:  Altered mental status, cough, missed dialysis Saturday, drowsy EXAM: PORTABLE CHEST 1 VIEW COMPARISON:  Portable exam 1430 hours compared to 07/04/2022 FINDINGS: Stent identified at RIGHT brachiocephalic vein. Enlargement of cardiac silhouette with pulmonary vascular congestion. Scratch at loculated  effusions at the apices. Patchy BILATERAL pulmonary infiltrates favoring pulmonary edema. Small basilar effusion inferior RIGHT hemithorax, decreased. More focal area of opacity in the mid to lower RIGHT lung is seen, likely represents loculated effusion at RIGHT major fissure, unchanged. Osseous demineralization. IMPRESSION: Enlargement of cardiac silhouette with pulmonary vascular congestion and probable diffuse pulmonary edema. BILATERAL pleural effusions larger on RIGHT with an area of focal opacity in the mid to lower RIGHT lung, favor loculated effusion as seen on the prior exam. Electronically Signed   By: Lavonia Dana M.D.   On: 08/02/2022 14:45    Procedures Procedures    Medications Ordered in ED Medications  dextrose 10 % infusion ( Intravenous New Bag/Given 08/02/22 1459)  Chlorhexidine Gluconate Cloth 2 % PADS 6 each (has no administration in time range)  calcium gluconate inj 10% (1 g) URGENT USE ONLY! (has no administration in time range)  dextrose 50 % solution 50 mL (50 mLs Intravenous Given 08/02/22 1356)  dextrose 50 % solution 50 mL (50 mLs Intravenous Given 08/02/22 1420)  sodium zirconium cyclosilicate (LOKELMA) packet 10 g (10 g Oral Given 08/02/22 1500)  sodium bicarbonate injection 50 mEq (50 mEq Intravenous Given 08/02/22 1457)  calcium gluconate inj 10% (1 g) URGENT USE ONLY! (1 g Intravenous Given 08/02/22 1450)    ED Course/ Medical Decision Making/ A&P                           Medical Decision Making Amount and/or Complexity of Data Reviewed Labs: ordered. Radiology: ordered.  Risk Prescription drug management. Decision regarding hospitalization.   This patient presents to the ED for concern of ams, this involves an extensive number of treatment options, and is a complaint that carries with it a high risk of complications and morbidity.  The differential diagnosis includes hypoglycemia, infection,  electrolyte abn   Co morbidities that complicate the  patient evaluation  ESRD on HD (TTS), HTN, arthritis, GERD, Sjogren's disease, pulmonary fibrosis, CHF, DVT s/p IVC (perinephric hematoma with thinners), CAD, and glaucoma   Additional history obtained:  Additional history obtained from epic chart review External records from outside source obtained and reviewed including EMS report/family   Lab Tests:  I Ordered, and personally interpreted labs.  The pertinent results include:  istat with k 6.8, bun 49 and cr 13.9; lactic 2.6; cbc with hgb 8.6 (chronic)   Imaging Studies ordered:  I ordered imaging studies including cxr  I independently visualized and interpreted imaging which showed  Enlargement of cardiac silhouette with pulmonary vascular congestion  and probable diffuse pulmonary edema.    BILATERAL pleural effusions larger on RIGHT with an area of focal  opacity in the mid to lower RIGHT lung, favor loculated effusion as  seen on the prior exam.   I agree with the radiologist interpretation   Cardiac Monitoring:  The patient was maintained on a cardiac monitor.  I personally viewed and interpreted the cardiac monitored which showed an underlying rhythm of: nsr   Medicines ordered and prescription drug management:  I ordered medication including dextrose  for hypoglycemia; calcium, bicarb, lokelma for hyperkalemia  Reevaluation of the patient after these medicines showed that the patient improved I have reviewed the patients home medicines and have made adjustments as needed  Critical Interventions:  Glucose, meds for elevated K   Consultations Obtained:  I requested consultation with the nephrologist (Dr. Joelyn Oms),  and discussed lab and imaging findings as well as pertinent plan -he will try to get pt into dialysis as soon as possible. Pt d/w Dr. Roosevelt Locks ([facility]) for admission.   Problem List / ED Course:  Hypoglycemia:  pt given 1 amp D50 and juice.  BS stayed low, so he was given another amp, given juice,  and put on a D10 drip. Hyperkalemia:  pt given lokelma, sodium bicarb, and calcium as his EKG t waves were peaked.  Nephrology to take pt to dialysis. Pulmonary edema:  pt is oxygenating well.  Dialysis planned.   Reevaluation:  After the interventions noted above, I reevaluated the patient and found that they have :improved   Social Determinants of Health:  Lives at home   Dispostion:  After consideration of the diagnostic results and the patients response to treatment, I feel that the patent would benefit from admission.    CRITICAL CARE Performed by: Isla Pence   Total critical care time: 30 minutes  Critical care time was exclusive of separately billable procedures and treating other patients.  Critical care was necessary to treat or prevent imminent or life-threatening deterioration.  Critical care was time spent personally by me on the following activities: development of treatment plan with patient and/or surrogate as well as nursing, discussions with consultants, evaluation of patient's response to treatment, examination of patient, obtaining history from patient or surrogate, ordering and performing treatments and interventions, ordering and review of laboratory studies, ordering and review of radiographic studies, pulse oximetry and re-evaluation of patient's condition.         Final Clinical Impression(s) / ED Diagnoses Final diagnoses:  Acute pulmonary edema ([place])  ESRD on hemodialysis ([place])  Hyperkalemia  Hypoglycemia  Chronic anemia    Rx / DC Orders ED Discharge Orders     None         Isla Pence, MD 08/02/22 1547

## 2022-08-02 NOTE — H&P (Signed)
History and Physical    Travis [name] EGB:[id number] DOB: 06-01-1941 DOA: 08/02/2022  PCP: [name], MD (Confirm with patient/family/NH records and if not entered, this has to be entered at [facility] point of entry) Patient coming from: Home  I have personally briefly reviewed patient's old medical records in [place]  Chief Complaint: Patient feels lethargic and in respiratory distress  HPI: Travis [name] is a 81 y.o. 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  Patient is confused unable to answer any questions, all questions answered by patient brother at bedside.  [facility]  He usually is scheduled for [facility]TS.  Last week, due to holiday schedule, he had his dialysis Friday and was scheduled to have next dialysis Sunday/yesterday.  Father reported that patient lives with his wife however due to acute health condition of the wife, patient has been by himself for last 3+ weeks with daughter drops by occasionally.  As a result, patient has been eating mostly fast food and there is a question of whether patient has been taking his medications regularly.  Friday after dialysis, patient complains of feeling tired and sleepy.  On Sunday, patient started to have diarrhea and fever of 31 F when he went to [place] to have scheduled dialysis, HD center cancelled the dialysis and sent him to ED however the patient misunderstood and went home instead.  Overnight, patient developed increasing shortness of breath and confusion more cough and shortness of breath.  And patient had large loose bowel movement this morning. Brother not sure whether patient had fever this morning, but did notice patient was very sleepy and called EMS.  ED Course: General lethargic,  blood pressure significant elevated, tachypneic borderline hypoxia 94% on room air.  Chest x-ray showed pulmonary congestion.  Blood work showed K7.1, and patient received hyperkalemia cocktail including insulin and dextrose and Lokelma.  Fingerstick 37 improved to 97 after D50. ED started patient on D10 drip and nephro was called for emergency HD.  Review of Systems: Unable to perform, patient confused.  Past Medical History:  Diagnosis Date   Acute on chronic respiratory failure with hypoxia (HCC) 01/28/2015   Anemia    Arthritis    Chest pain    Chronic combined systolic and diastolic CHF (congestive heart failure) ([place])    a. Etiology of low EF not defined.   Chronic respiratory failure (HCC)    DVT (deep venous thrombosis) ([place])    a. 01/2015: RLE DVT. VQ scan intermediate probability. Underwent renal bx complicated by perinephric hematoma; anticoagulation stopped and IVC filter placed.   ESRD on hemodialysis ([place]) 02/2015   a. 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 Received plasma exchange and steroids but didn't respond and ended up starting hemodialysis June 2016.    Essential hypertension    GERD (gastroesophageal reflux disease)    History of nuclear stress test    Myoview 9/19 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 04/2015 in HD in setting of BP 50s.    Unintentional weight loss    Wears partial dentures     Past Surgical History:  Procedure Laterality Date   AV FISTULA PLACEMENT Right 02/17/2015   Procedure: INSERTION OF RIGHT ARM  ARTERIOVENOUS (AV) GORE-TEX GRAFT ;  Surgeon: Elam Dutch, MD;  Location: [place];  Service: Vascular;  Laterality: Right;   FLEXIBLE SIGMOIDOSCOPY N/A 08/04/2015   Procedure: FLEXIBLE SIGMOIDOSCOPY;  Surgeon: Wilford Corner, MD;  Location[facility] ENDOSCOPY;  Service: Endoscopy;  Laterality: N/A;   HEMORROIDECTOMY  1999   INSERTION OF DIALYSIS CATHETER N/A 02/17/2015   Procedure: INSERTION OF DIALYSIS CATHETER RIGHT INTERNAL JUGULAR VEIN;  Surgeon: Elam Dutch, MD;  Location: [place];  Service: Vascular;  Laterality: N/A;   MULTIPLE TOOTH EXTRACTIONS     PERIPHERAL VASCULAR BALLOON ANGIOPLASTY Right 01/23/2019   Procedure: PERIPHERAL VASCULAR BALLOON ANGIOPLASTY;  Surgeon: Elam Dutch, MD;  Location: [place] CV LAB;  Service: Cardiovascular;  Laterality: Right;   REVISION OF ARTERIOVENOUS GORETEX GRAFT Right 03/29/2017   Procedure: REVISION OF ARTERIOVENOUS GORETEX GRAFT;  Surgeon: Conrad [place], MD;  Location: [place];  Service: Vascular;  Laterality: Right;   RIGHT/LEFT HEART CATH AND CORONARY ANGIOGRAPHY N/A 07/06/2022   Procedure: RIGHT/LEFT HEART CATH AND CORONARY ANGIOGRAPHY;  Surgeon: Leonie Man, MD;  Location: [place] [facility];  Service: Cardiovascular;  Laterality: N/A;   Surgical procedure to remove a mole as a child Right Eye area   At around 63 years old     reports that he has never smoked. He has never used smokeless tobacco. He reports that he does not drink alcohol and does not use drugs.  No Known Allergies  Family History  Problem Relation Age of Onset   Hypertension Mother    Healthy Father    Hypertension Sister    Hypertension Brother      Prior to Admission medications   Medication Sig Start Date End Date Taking? Authorizing Provider  Accu-Chek Softclix Lancets  lancets Use to test blood glucose four times daily as directed. 05/26/21   Thurnell Lose, MD  amLODipine (NORVASC) 10 MG tablet Take 1 tablet (10 mg total) by mouth daily. 05/26/21   Thurnell Lose, MD  aspirin EC 81 MG EC tablet Take 1 tablet (81 mg total) by mouth daily. 04/23/15   Reyne Dumas, MD  atorvastatin (LIPITOR) 40 MG tablet Take 1 tablet (40 mg total) by mouth daily at 6 PM. 04/23/15   Reyne Dumas, MD  atropine 1 % ophthalmic solution SMARTSIG:In Eye(s) 06/23/22   [provider]  Blood Glucose Monitoring Suppl (BLOOD GLUCOSE MONITOR SYSTEM) w/Device KIT Use up to four times daily as directed. 05/26/21   Thurnell Lose, MD  Brinzolamide-Brimonidine 1-0.2 % SUSP Place 1 drop into both eyes in the morning, at noon, and at bedtime. 11/03/21   [provider]  calcitRIOL (ROCALTROL) 0.25 MCG capsule Take 13 capsules (3.25 mcg total) by mouth every Monday, Wednesday, and Friday with hemodialysis. 12/25/21   Cherene Altes, MD  carvedilol (COREG) 12.5 MG tablet Take 1 tablet (12.5 mg total) by mouth  2 (two) times daily with a meal. 07/08/22   Domenic Polite, MD  cinacalcet [facility]) 30 MG tablet Take 3 tablets (90 mg total) by mouth every Monday, Wednesday, and Friday with hemodialysis. 12/25/21   Cherene Altes, MD  ferric gluconate 62.5 mg in sodium chloride 0.9 % 100 mL Inject 62.5 mg into the vein every Wednesday with hemodialysis. 12/30/21   Cherene Altes, MD  glucose blood test strip Use to test blood glucose four times daily as directed 05/26/21   Thurnell Lose, MD  latanoprost (XALATAN) 0.005 % ophthalmic solution Place 1 drop into both eyes at bedtime. 05/05/21   [provider]  losartan (COZAAR) 25 MG tablet Take 1 tablet (25 mg total) by mouth at bedtime. Patient not taking: Reported on 07/05/2022 12/25/21   Cherene Altes, MD  nitroGLYCERIN (NITROSTAT) 0.4 MG SL tablet Place 1 tablet (0.4 mg total) under the tongue every 5 (five)  minutes as needed for chest pain (for total of 3 doses at the most). 07/08/22   Domenic Polite, MD  ofloxacin (OCUFLOX) 0.3 % ophthalmic solution SMARTSIG:In Eye(s) 06/23/22   [provider]  omeprazole (PRILOSEC) 40 MG capsule Take 40 mg by mouth daily. 07/31/15   [provider]  OXYGEN Inhale 2 L into the lungs daily.    [provider]  prednisoLONE acetate (PRED FORTE) 1 % ophthalmic suspension SMARTSIG:1 Drop(s) In Eye(s) 6 Times Daily 06/23/22   [provider]  sevelamer carbonate (RENVELA) 800 MG tablet Take 2 tablets (1,600 mg total) by mouth 3 (three) times daily with meals. 05/17/15   Cherene Altes, MD  timolol (TIMOPTIC-XR) 0.5 % ophthalmic gel-forming Place 1 drop into both eyes daily. 11/03/21   [provider]  VITAMIN A PO Take 1 tablet by mouth daily.    [provider]  VITAMIN D PO Take 1 tablet by mouth daily.    [provider]    Physical Exam: Vitals:   08/02/22 1500 08/02/22 1515 08/02/22 1530 08/02/22 1545  BP: (!) 204/97 (!) 197/100 (!) 195/97 (!) 191/99  Pulse: 93     Resp: (!) 39 (!) 33 (!) 32 (!) 37  Temp:      TempSrc:      SpO2: 99%     Weight:      Height:        Constitutional: NAD, calm, comfortable Vitals:   08/02/22 1500 08/02/22 1515 08/02/22 1530 08/02/22 1545  BP: (!) 204/97 (!) 197/100 (!) 195/97 (!) 191/99  Pulse: 93     Resp: (!) 39 (!) 33 (!) 32 (!) 37  Temp:      TempSrc:      SpO2: 99%     Weight:      Height:       Eyes: PERRL, lids and conjunctivae normal ENMT: Mucous membranes are moist. Posterior pharynx clear of any exudate or lesions.Normal dentition.  Neck: normal, supple, no masses, no thyromegaly. JVD about 6 cm above clavicle Respiratory: Diminished breathing sound bilaterally, no wheezing wheezing, fine crackles B/L to the level of mid-field, increasing breathing effort. No accessory muscle use.  Cardiovascular: Regular rate and rhythm, no murmurs / rubs  / gallops. No extremity edema. 2+ pedal pulses. No carotid bruits.  Abdomen: no tenderness, no masses palpated. No hepatosplenomegaly. Bowel sounds positive.  Musculoskeletal: no clubbing / cyanosis. No joint deformity upper and lower extremities. Good ROM, no contractures. Normal muscle tone.  Skin: no rashes, lesions, ulcers. No induration Neurologic:  No facial droops, moving all limbs, following simple commands Psychiatric: Lethargic, arousable, confused.    Labs on Admission: I have personally reviewed following labs and imaging studies  CBC: Recent Labs  Lab 08/02/22 1355 08/02/22 1413  WBC 7.1  --   NEUTROABS 5.4  --   HGB 8.6* 10.2*  HCT 29.7* 30.0*  MCV 99.7  --   PLT 174  --    Basic Metabolic Panel: Recent Labs  Lab 08/02/22 1355 08/02/22 1413  NA 137 133*  K 7.1* 6.8*  CL 95* 100  CO2 23  --   GLUCOSE 94 97  BUN 53* 49*  CREATININE 12.41* 13.90*  CALCIUM 8.6*  --    GFR: Estimated Creatinine Clearance: 4.2 mL/min (A) (by C-G formula based on SCr of 13.9 mg/dL (H)). Liver Function Tests: Recent Labs  Lab 08/02/22 1355  AST 127*  ALT 86*  ALKPHOS 68  BILITOT 0.8  PROT 7.5  ALBUMIN 3.0*   No results for input(s): "LIPASE", "AMYLASE" in the last 168 hours. No results for input(s): "AMMONIA" in the last 168 hours. Coagulation Profile: No results for input(s): "INR", "PROTIME" in the last 168 hours. Cardiac Enzymes: No results for input(s): "CKTOTAL", "CKMB", "CKMBINDEX", "TROPONINI" in the last 168 hours. BNP (last 3 results) No results for input(s): "PROBNP" in the last 8760 hours. HbA1C: No results for input(s): "HGBA1C" in the last 72 hours. CBG: Recent Labs  Lab 08/02/22 1345 08/02/22 1414 08/02/22 1510  GLUCAP 44* 35* 108*   Lipid Profile: No results for input(s): "CHOL", "HDL", "LDLCALC", "TRIG", "CHOLHDL", "LDLDIRECT" in the last 72 hours. Thyroid Function Tests: No results for input(s): "TSH", "T4TOTAL", "FREET4", "T3FREE",  "THYROIDAB" in the last 72 hours. Anemia Panel: No results for input(s): "VITAMINB12", "FOLATE", "FERRITIN", "TIBC", "IRON", "RETICCTPCT" in the last 72 hours. Urine analysis:    Component Value Date/Time   COLORURINE AMBER (A) 01/29/2015 2200   APPEARANCEUR CLOUDY (A) 01/29/2015 2200   LABSPEC 1.013 01/29/2015 2200   PHURINE 5.0 01/29/2015 2200   GLUCOSEU NEGATIVE 01/29/2015 2200   HGBUR LARGE (A) 01/29/2015 2200   BILIRUBINUR NEGATIVE 01/29/2015 2200   KETONESUR NEGATIVE 01/29/2015 2200   PROTEINUR 100 (A) 01/29/2015 2200   UROBILINOGEN 0.2 01/29/2015 2200   NITRITE NEGATIVE 01/29/2015 2200   LEUKOCYTESUR NEGATIVE 01/29/2015 2200    Radiological Exams on Admission: DG Chest Portable 1 View  Result Date: 08/02/2022 CLINICAL DATA:  Altered mental status, cough, missed dialysis Saturday, drowsy EXAM: PORTABLE CHEST 1 VIEW COMPARISON:  Portable exam 1430 hours compared to 07/04/2022 FINDINGS: Stent identified at RIGHT brachiocephalic vein. Enlargement of cardiac silhouette with pulmonary vascular congestion. Scratch at loculated effusions at the apices. Patchy BILATERAL pulmonary infiltrates favoring pulmonary edema. Small basilar effusion inferior RIGHT hemithorax, decreased. More focal area of opacity in the mid to lower RIGHT lung is seen, likely represents loculated effusion at RIGHT major fissure, unchanged. Osseous demineralization. IMPRESSION: Enlargement of cardiac silhouette with pulmonary vascular congestion and probable diffuse pulmonary edema. BILATERAL pleural effusions larger on RIGHT with an area of focal opacity in the mid to lower RIGHT lung, favor loculated effusion as seen on the prior exam. Electronically Signed   By: Lavonia Dana M.D.   On: 08/02/2022 14:45    EKG: Independently reviewed.  Sinus supple tented T waves on leads  Assessment/Plan Principal Problem:   CHF (congestive heart failure) (HCC) Active Problems:   Acute systolic CHF (congestive heart failure)  (HCC)   Hypertensive encephalopathy   Hyperkalemia  (please populate  well all problems here in Problem List. (For example, if patient is on BP meds at home and you resume or decide to hold them, it is a problem that needs to be her. Same the ED.  CAD, COPD, HLD and so on)  Hyperkalemia -2/2 AKI on CKD from missed HD -With EKG changes, received hyperkalemia cocktail including calcium gluconate, bicarb IV push and dextrose, Lokelma, insulin not given due to severe hypoglycemia.  We will give 1 dose of IV Lasix 100 mg. -Emergency HD  this afternoon -Recheck K level this eveninig  Acute on chronic HFrEF -Secondary to hypertension emergency and missed dialysis -Doubt patient can take p.o. this point, ordered as needed hydralazine to control BP, emergency HD will also help BP control. -1 dose IV Lasix ordered. -Echo was done recently along with left heart cath, will not repeat echo at this time.  Acute hypoxic respiratory failure -Secondary CHF, mentioned as above.  Acute metabolic encephalopathy -Probably secondary to HTN encephalopathy and hypoglycemia as well as worsening of uremia -Treat accordingly. -Check CT head  HTN emergency -Resume home BP meds when able to take p.o. -For now, as needed hydralazine -Emergency dialysis  Hypoglycemia -Improvement of fingerstick reading in this hour, decrease D10 drip as patient also in CHF. Probably related to poor calorie intake for his GI symptoms and mentation changes. -Hold off DM meds  SIRS -Tachypneic with elevated lactate level, fever along with new onset of diarrhea, happened yesterday, and his last episode of diarrhea was this morning before coming to ED and no fever thus far during ED stay -No clear etiology, as most of his breathing symptoms can be explained by CHF fluid overload and diarrhea probably improving and patient has no abdominal pain or N/V complaints at this point. Will check procalcitonin but hold off  antibiotics.  Acute transaminitis -Probably from CHF decompensation -Hold off statin, repeat LFT tomorrow. -Check ammonia level  CAD -with known 2 vessel disease on recent cath -Medical management as per Cardiology on last admission.  Deconditioning -PT evaluation  DVT prophylaxis: Heparin subcu Code Status: Full code Family Communication: Brother at bedside Disposition Plan: Patient is sick with CHF decompensation requiring inpatient treatment and emergency dialysis for hyperkalemia, expect more than 2 midnight hospital stay. Consults called: Nephrology Admission status: PCU   Lequita Halt MD [facility] Pager [phone]  08/02/2022, 4:05 PM

## 2022-08-02 NOTE — ED Notes (Signed)
Consent complete with pt's daughter for dialysis.

## 2022-08-02 NOTE — ED Notes (Signed)
MD informed of BG recheck 35. Juice with sugar packets given.

## 2022-08-02 NOTE — ED Notes (Signed)
Juice given while notifying MD of BG.

## 2022-08-02 NOTE — ED Notes (Signed)
Called dialysis and notified RN that pt has a bed assigned to [facility] and that pt will not be coming back to ED after dialysis.

## 2022-08-02 NOTE — ED Triage Notes (Signed)
BIB EMS for AMS and cough, pt TTS dialysis and missed Sat., last tx Thursday, pt alert, drowsy

## 2022-08-02 NOTE — Consult Note (Signed)
Travis [name] Admit Date: 08/02/2022 08/02/2022 Rexene Agent Requesting Physician:  Gilford Raid MD  Reason for Consult:  ESRD, Hyperkalemia, AHRF HPI:  4M ESRD MWF [facility] via [facility]G RFA who was to have dialysis yesterday 11/19 on hold as scheduled.  Originally he was late and upon arrival was noted to be weak and unable to stand or move without assistance of morere than 3 employees.  He had a temperature of 101.8. He was not safe or capable of outpatient dialysis and the family was contacted for the patient to present to the ED.  He went back home and then presented to the ED today.  He has had ongoing fevers at home.  Upon arrival to the ED the patient is hypoglycemic requiring D50, hyperkalemic to 7.1 with some evidence of peaked T waves treated with parenteral calcium and sodium bicarbonate and Lokelma, and with respiratory distress including tachypnea and requiring 3 L nasal cannula.  Portable chest x-ray with findings of pulmonary vascular congestion and probable diffuse pulmonary edema.  Daughter and son-in-law at bedside.  No recent sick contacts.  Wife recently returned home from nursing facility.  He has had some diarrhea.  PMH Incudes: Chronic diastolic heart failure with acute exacerbation about 1 month ago, history of DVT with IVC filter, CAD, pulmonary fibrosis, hypertension, GERD.  ROS Balance of 12 systems is negative w/ exceptions as above  PMH  Past Medical History:  Diagnosis Date   Acute on chronic respiratory failure with hypoxia (HCC) 01/28/2015   Anemia    Arthritis    Chest pain    Chronic combined systolic and diastolic CHF (congestive heart failure) ([place])    a. Etiology of low EF not defined.   Chronic respiratory failure (HCC)    DVT (deep venous thrombosis) ([place])    a. 01/2015: RLE DVT. VQ scan intermediate probability. Underwent renal bx complicated by perinephric hematoma; anticoagulation stopped and IVC filter placed.   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 Received plasma exchange and steroids but didn't respond and ended up starting hemodialysis June 2016.    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 04/2015 in HD in setting of BP 50s.   Unintentional weight loss    Wears partial dentures    PSH  Past Surgical History:  Procedure Laterality Date   AV FISTULA PLACEMENT Right 02/17/2015   Procedure: INSERTION OF RIGHT ARM  ARTERIOVENOUS (AV) GORE-TEX GRAFT ;  Surgeon: Elam Dutch, MD;  Location: [place];  Service: Vascular;  Laterality: Right;   FLEXIBLE SIGMOIDOSCOPY N/A 08/04/2015   Procedure: FLEXIBLE SIGMOIDOSCOPY;  Surgeon: Wilford Corner, MD;  Location: [place];  Service: Endoscopy;  Laterality: N/A;   HEMORROIDECTOMY  1999   INSERTION OF DIALYSIS CATHETER N/A 02/17/2015   Procedure: INSERTION OF DIALYSIS CATHETER RIGHT INTERNAL JUGULAR VEIN;  Surgeon: Elam Dutch, MD;  Location: [place];  Service: Vascular;  Laterality: N/A;   MULTIPLE TOOTH EXTRACTIONS     PERIPHERAL VASCULAR BALLOON ANGIOPLASTY Right 01/23/2019   Procedure: PERIPHERAL VASCULAR BALLOON ANGIOPLASTY;  Surgeon: Elam Dutch, MD;  Location: [place] [facility];  Service: Cardiovascular;   Laterality: Right;   REVISION OF ARTERIOVENOUS GORETEX GRAFT Right 03/29/2017   Procedure: REVISION OF ARTERIOVENOUS GORETEX GRAFT;  Surgeon: Conrad Morrisville, MD;  Location: [place];  Service: Vascular;  Laterality: Right;   RIGHT/LEFT HEART CATH AND CORONARY ANGIOGRAPHY N/A 07/06/2022   Procedure: RIGHT/LEFT HEART CATH AND CORONARY ANGIOGRAPHY;  Surgeon: Leonie Man, MD;  Location: [place] [facility];  Service: Cardiovascular;  Laterality: N/A;   Surgical procedure to remove a mole as a child Right Eye area   At around 69 years old   FH  Family History  Problem Relation Age of Onset   Hypertension Mother    Healthy Father    Hypertension Sister    Hypertension Brother    SH  reports that he has never smoked. He has never used smokeless tobacco. He reports that he does not drink alcohol and does not use drugs. Allergies No Known Allergies Home medications Prior to Admission medications   Medication Sig Start Date End Date Taking? Authorizing Provider  Accu-Chek Softclix Lancets lancets Use to test blood glucose four times daily as directed. 05/26/21   Thurnell Lose, MD  amLODipine (NORVASC) 10 MG tablet Take 1 tablet (10 mg total) by mouth daily. 05/26/21   Thurnell Lose, MD  aspirin EC 81 MG EC tablet Take 1 tablet (81 mg total) by mouth daily. 04/23/15   Reyne Dumas, MD  atorvastatin (LIPITOR) 40 MG tablet Take 1 tablet (40 mg total) by mouth daily at 6 PM. 04/23/15   Reyne Dumas, MD  atropine 1 % ophthalmic solution SMARTSIG:In Eye(s) 06/23/22   [provider]  Blood Glucose Monitoring Suppl (BLOOD GLUCOSE MONITOR SYSTEM) w/Device KIT Use up to four times daily as directed. 05/26/21   Thurnell Lose, MD  Brinzolamide-Brimonidine 1-0.2 % SUSP Place 1 drop into both eyes in the morning, at noon, and at bedtime. 11/03/21   [provider]  calcitRIOL (ROCALTROL) 0.25 MCG capsule Take 13 capsules (3.25 mcg total) by mouth every Monday, Wednesday, and Friday with  hemodialysis. 12/25/21   Cherene Altes, MD  carvedilol (COREG) 12.5 MG tablet Take 1 tablet (12.5 mg total) by mouth 2 (two) times daily with a meal. 07/08/22   Domenic Polite, MD  cinacalcet (SENSIPAR) 30 MG tablet Take 3 tablets (90 mg total) by mouth every Monday, Wednesday, and Friday with hemodialysis. 12/25/21   Cherene Altes, MD  ferric gluconate 62.5 mg in sodium chloride 0.9 % 100 mL Inject 62.5 mg into the vein every Wednesday with hemodialysis. 12/30/21   Cherene Altes, MD  glucose blood test strip Use to test blood glucose four times daily as directed 05/26/21   Thurnell Lose, MD  latanoprost (XALATAN) 0.005 % ophthalmic solution Place 1 drop into both eyes at bedtime. 05/05/21   [provider]  losartan (COZAAR) 25 MG tablet Take 1 tablet (25 mg total) by mouth at bedtime. Patient not taking: Reported on 07/05/2022 12/25/21   Cherene Altes, MD  nitroGLYCERIN (NITROSTAT) 0.4 MG SL tablet Place 1 tablet (0.4 mg total) under the tongue every 5 (five) minutes as needed for chest pain (for total of 3 doses at the most). 07/08/22   Domenic Polite, MD  ofloxacin (OCUFLOX) 0.3 % ophthalmic solution SMARTSIG:In Eye(s) 06/23/22   [provider]  omeprazole (PRILOSEC) 40 MG capsule Take 40 mg by mouth daily. 07/31/15   [provider]  OXYGEN Inhale 2 L into the lungs daily.    [provider]  prednisoLONE acetate (PRED FORTE) 1 % ophthalmic suspension SMARTSIG:1 Drop(s) In Eye(s) 6 Times Daily 06/23/22   [provider]  sevelamer carbonate (RENVELA) 800 MG tablet Take 2 tablets (1,600 mg total) by mouth 3 (three) times daily with meals. 05/17/15   Cherene Altes, MD  timolol (TIMOPTIC-XR) 0.5 % ophthalmic gel-forming Place 1 drop into both eyes daily. 11/03/21   [provider]  VITAMIN A PO Take 1 tablet by mouth daily.    [provider]  VITAMIN D PO Take 1 tablet by mouth daily.    [provider]     Current Medications Scheduled Meds:  calcium gluconate  1 g Intravenous Once   [START ON 08/03/2022] Chlorhexidine Gluconate Cloth  6 each Topical Q0600   Continuous Infusions:  dextrose 125 mL/hr at 08/02/22 1459   PRN Meds:.  CBC Recent Labs  Lab 08/02/22 1355 08/02/22 1413  WBC 7.1  --   NEUTROABS 5.4  --   HGB 8.6* 10.2*  HCT 29.7* 30.0*  MCV 99.7  --   PLT 174  --    Basic Metabolic Panel Recent Labs  Lab 08/02/22 1355 08/02/22 1413  NA 137 133*  K 7.1* 6.8*  CL 95* 100  CO2 23  --   GLUCOSE 94 97  BUN 53* 49*  CREATININE 12.41* 13.90*  CALCIUM 8.6*  --     Physical Exam  Blood pressure (!) 195/97, pulse 93, temperature 98.3 F (36.8 C), temperature source Oral, resp. rate (!) 32, height _0  (1.778 m), weight 70.2 kg, SpO2 99 %. GEN: Elderly male, tachypneic, engaged in conversation ENT: Some temporal wasting EYES: Eyes sunken CV: Regular, normal S1 and S2 PULM: Increased respiratory effort, diminished in the bases ABD: Soft, nontender SKIN: No rashes or lesions EXT: No significant edema Right forearm graft with bruit and thrill  Outpt HD Orders Unit: [facility] Days: MWF Time: 3.5h Dialyzer: F180 EDW: 69.8kg K/Ca: 2/2 Access: RFA AVG Needle Size: 15g x2 BFR/DFR: 400 / a1.5 UF Proflie: profile 2  VDRA / calcimimetic: calcitriol 2.38mg qTx; sensipar [id number] EPO: Mircera [id number]; last given 11/15 IV Fe: Venofer 100m23m due now Heparin: none  Assessment 34M ESRD on iHD with AHRF, Severe hyperkalemia, febrile illness  ESRD MWF [facility] R FA AVG: HD today as below Hyperkalemia: not sure that missing HD on Sunday explains.  HD now,  Rec IV Ca, HCO3 and lokelma in ED AHRF / Pulm Edema: HD now, UF as able Febrile illness, cough: Resp panel pending currently; [facility] stable BMD: cont C3 and sensipar  HTN/Vol: as above   Plan HD now as above  RyanRexene Agent/20/2023, 3:51 PM

## 2022-08-02 NOTE — ED Notes (Signed)
Dr. Gilford Raid informed of lactic acid 2.6.

## 2022-08-03 ENCOUNTER — Inpatient Hospital Stay (HOSPITAL_COMMUNITY): Payer: No Typology Code available for payment source

## 2022-08-03 ENCOUNTER — Ambulatory Visit: Payer: Medicare Other | Admitting: Interventional Cardiology

## 2022-08-03 DIAGNOSIS — I5023 Acute on chronic systolic (congestive) heart failure: Secondary | ICD-10-CM | POA: Diagnosis not present

## 2022-08-03 DIAGNOSIS — J101 Influenza due to other identified influenza virus with other respiratory manifestations: Secondary | ICD-10-CM | POA: Insufficient documentation

## 2022-08-03 LAB — HEPATIC FUNCTION PANEL
ALT: 161 U/L — ABNORMAL HIGH (ref 0–44)
AST: 249 U/L — ABNORMAL HIGH (ref 15–41)
Albumin: 2.8 g/dL — ABNORMAL LOW (ref 3.5–5.0)
Alkaline Phosphatase: 61 U/L (ref 38–126)
Bilirubin, Direct: 0.1 mg/dL (ref 0.0–0.2)
Indirect Bilirubin: 0.6 mg/dL (ref 0.3–0.9)
Total Bilirubin: 0.7 mg/dL (ref 0.3–1.2)
Total Protein: 7.1 g/dL (ref 6.5–8.1)

## 2022-08-03 LAB — BASIC METABOLIC PANEL
Anion gap: 16 — ABNORMAL HIGH (ref 5–15)
BUN: 28 mg/dL — ABNORMAL HIGH (ref 8–23)
CO2: 31 mmol/L (ref 22–32)
Calcium: 8.8 mg/dL — ABNORMAL LOW (ref 8.9–10.3)
Chloride: 89 mmol/L — ABNORMAL LOW (ref 98–111)
Creatinine, Ser: 8.52 mg/dL — ABNORMAL HIGH (ref 0.61–1.24)
GFR, Estimated: 6 mL/min — ABNORMAL LOW (ref >60)
Glucose, Bld: 91 mg/dL (ref 70–99)
Potassium: 4.2 mmol/L (ref 3.5–5.1)
Sodium: 136 mmol/L (ref 135–145)

## 2022-08-03 LAB — HEPATITIS B SURFACE ANTIGEN: Hepatitis B Surface Ag: NONREACTIVE

## 2022-08-03 LAB — CBC
HCT: 30.2 % — ABNORMAL LOW (ref 39.0–52.0)
Hemoglobin: 9.6 g/dL — ABNORMAL LOW (ref 13.0–17.0)
MCH: 29.5 pg (ref 26.0–34.0)
MCHC: 31.8 g/dL (ref 30.0–36.0)
MCV: 92.9 fL (ref 80.0–100.0)
Platelets: 166 10*3/uL (ref 150–400)
RBC: 3.25 MIL/uL — ABNORMAL LOW (ref 4.22–5.81)
RDW: 15.9 % — ABNORMAL HIGH (ref 11.5–15.5)
WBC: 10.6 10*3/uL — ABNORMAL HIGH (ref 4.0–10.5)
nRBC: 0 % (ref 0.0–0.2)

## 2022-08-03 LAB — GLUCOSE, CAPILLARY
Glucose-Capillary: 68 mg/dL — ABNORMAL LOW (ref 70–99)
Glucose-Capillary: 91 mg/dL (ref 70–99)

## 2022-08-03 LAB — PROCALCITONIN: Procalcitonin: 8.1 ng/mL

## 2022-08-03 LAB — TROPONIN I (HIGH SENSITIVITY): Troponin I (High Sensitivity): 1902 ng/L (ref <18)

## 2022-08-03 LAB — LACTIC ACID, PLASMA: Lactic Acid, Venous: 2.6 mmol/L (ref 0.5–1.9)

## 2022-08-03 MED ORDER — CALCITRIOL 0.5 MCG PO CAPS
2.7500 ug | ORAL_CAPSULE | ORAL | Status: DC
  Administered 2022-08-04: 2.75 ug via ORAL
  Filled 2022-08-03: qty 1

## 2022-08-03 MED ORDER — OSELTAMIVIR PHOSPHATE 30 MG PO CAPS
30.0000 mg | ORAL_CAPSULE | Freq: Once | ORAL | Status: AC
  Administered 2022-08-03: 30 mg via ORAL
  Filled 2022-08-03: qty 1

## 2022-08-03 MED ORDER — CINACALCET HCL 30 MG PO TABS: 90.0000 mg | ORAL_TABLET | ORAL | Status: DC

## 2022-08-03 MED ORDER — PIPERACILLIN-TAZOBACTAM IN DEX 2-0.25 GM/50ML IV SOLN
2.2500 g | Freq: Three times a day (TID) | INTRAVENOUS | Status: DC
  Administered 2022-08-03 – 2022-08-05 (×6): 2.25 g via INTRAVENOUS
  Filled 2022-08-03 (×7): qty 50

## 2022-08-03 MED ORDER — CINACALCET HCL 30 MG PO TABS
90.0000 mg | ORAL_TABLET | ORAL | Status: DC
  Administered 2022-08-04: 90 mg via ORAL
  Filled 2022-08-03: qty 3

## 2022-08-03 MED ORDER — OSELTAMIVIR PHOSPHATE 30 MG PO CAPS
30.0000 mg | ORAL_CAPSULE | ORAL | Status: DC
  Administered 2022-08-04: 30 mg via ORAL
  Filled 2022-08-03: qty 1

## 2022-08-03 NOTE — Progress Notes (Signed)
Pt receives out-pt HD at [facility] on MWF. Pt arrives at 5:40 am for 6:00 am chair time. Will assist as needed.   Melven Sartorius Renal Navigator [phone]

## 2022-08-03 NOTE — Progress Notes (Signed)
Pharmacy Antibiotic Note  Travis [name] is a 81 y.o. male admitted on 08/02/2022 with acute respiratory failure likely secondary to acute on chronic HFrEF with bilateral pleural effusions. Found to be positive for influenza A on respiratory culture. However, elevated procalcitonin of 8.1, gives concern for aspiration vs. community-acquired pneumonia.  Pharmacy has been consulted for Zosyn dosing.   Per discussion with primary provider, empiric coverage for Pseudomonas aeruginosa is desired, treatment of influenza A is also desired. Pt has ESRD and receives HD MWF outpatient.   Plan: Start Zosyn 2.25g IV q8h Start Tamiflu 30mg  qHD x 5 days (per discussion with Dr. Doristine Bosworth) Monitor daily CBC, temp, SCr, and for clinical signs of improvement  F/u cultures and de-escalate antibiotics as able   Height: 5\' 10"  (177.8 cm) Weight: 68.9 kg (151 lb 14.4 oz) IBW/kg (Calculated) : 73  Temp (24hrs), Avg:99.2 F (37.3 C), Min:98.7 F (37.1 C), Max:99.7 F (37.6 C)  Recent Labs  Lab 08/02/22 1355 08/02/22 1413 08/03/22 0710 08/03/22 0845  WBC 7.1  --   --  10.6*  CREATININE 12.41* 13.90* 8.52*  --   LATICACIDVEN 2.6*  --   --   --     Estimated Creatinine Clearance: 6.7 mL/min (A) (by C-G formula based on SCr of 8.52 mg/dL (H)).    No Active Allergies  Antimicrobials this admission: Zosyn 11/21 >>   Dose adjustments this admission: N/A  Microbiology results: 11/20 respiratory viral panel: positive for influenza A 11/20 MRSA PCR: not detected  Thank you for allowing pharmacy to be a part of this patient's care.  Luisa Hart, PharmD, BCPS Clinical Pharmacist 08/03/2022 3:32 PM   Please refer to [facility] for pharmacy phone number

## 2022-08-03 NOTE — Progress Notes (Addendum)
PROGRESS NOTE    Travis [name]  ZOX:[id number] DOB: 1940-09-21 DOA: 08/02/2022 PCP: [name], MD   Brief Narrative:  HPI: Travis [name] is a 81 y.o. 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   Patient is confused unable to answer any questions, all questions answered by patient brother at bedside.  [facility]  He usually is scheduled for [facility].  Last week, due to holiday schedule, he had his dialysis Friday and was scheduled to have next dialysis Sunday/yesterday.  Father reported that patient lives with his wife however due to acute health condition of the wife, patient has been by himself for last 3+ weeks with daughter drops by occasionally.  As a result, patient has been eating mostly fast food and there is a question of whether patient has been taking his medications regularly.  Friday after dialysis, patient complains of feeling tired and sleepy.  On Sunday, patient started to have diarrhea and fever of 12 F when he went to [place] to have scheduled dialysis, HD center cancelled the dialysis and sent him to ED however the patient misunderstood and went home instead.  Overnight, patient developed increasing shortness of breath and confusion more cough and shortness of breath.  And patient had large loose bowel movement this morning. Brother not sure whether patient had fever this morning, but did notice patient was very sleepy and called EMS.   ED Course: General lethargic, blood pressure significant elevated, tachypneic borderline hypoxia 94% on room air.  Chest x-ray showed pulmonary congestion.  Blood work showed K7.1, and patient received hyperkalemia cocktail including insulin and dextrose and Lokelma.  Fingerstick 37 improved to 97 after  D50. ED started patient on D10 drip and nephro was called for emergency HD.  Assessment & Plan:   Principal Problem:   CHF (congestive heart failure) (HCC) Active Problems:   Acute systolic CHF (congestive heart failure) (HCC)   Hypertensive encephalopathy   Hyperkalemia   Influenza A  Hyperkalemia: Secondary to missed dialysis, resolved.   Acute respiratory failure with hypoxia secondary to acute on chronic HFrEF/bilateral pleural effusions: Chest x-ray shows diffuse pulmonary edema and bilateral pleural effusions, right larger than the left.  Possibility of loculated effusion.  Overall x-ray is improved compared to yesterday.  I would hope that further hemodialysis will improve this effusion.  If not, we will consult PCCM and seek their opinion.  This is all likely secondary to missed hemodialysis as well.  Patient received 1 dose of Lasix.  Echo was recently done so repeat is not needed.  Patient currently requiring 3 L of oxygen but saturating 100%.  Will wean oxygen as able to.  Aspiration pneumonia/community-acquired pneumonia: Patient does not meet sepsis criteria.  He only had tachypnea.  Leukocytosis also 10.6.  With elevated procalcitonin in the range of 8.10, there is suspicion of possible right lower lobe pneumonia.  I will start him on Zosyn for that and follow closely.  He is afebrile.  Influenza A pneumonia: Started on Tamiflu.   Acute metabolic encephalopathy -Probably secondary to HTN encephalopathy and hypoglycemia as well as worsening of uremia.  CT head unremarkable.  Patient slightly more alert and oriented x 2.  Hopefully he will improve further.   HTN emergency: Patient presented with acute respiratory failure, pulm edema and acute  hypertensive encephalopathy.  Blood pressure was elevated up to 204/97 upon presentation, this was likely secondary to missed hemodialysis as well.  Blood pressure is low today.  He already received Coreg but amlodipine was held due to low  BP.   Hypoglycemia: Blood sugar was 37 upon presentation, which improved to 97 after D50.  He was started on D10 drip.  He is on very low-dose of that and despite of that his blood sugar is low normal.  Will continue that.   Elevated LFTs: Slightly elevated LFTs however his AST ratio is much greater than ALT suggesting alcoholic hepatitis.  This could also be secondary to acute infection.  Follow closely.   CAD -with known 2 vessel disease on recent cath Currently asymptomatic.  Continue aspirin, Coreg.   Deconditioning -PT evaluation  DVT prophylaxis: heparin injection 5,000 Units Start: 08/02/22 2300   Code Status: Full Code  Family Communication:  None present at bedside.  I called and updated his brother-in-law Josph Macho.  Status is: Inpatient Remains inpatient appropriate because: Patient still very sick   Estimated body mass index is 21.79 kg/m as calculated from the following:   Height as of this encounter: 5\' 10"  (1.778 m).   Weight as of this encounter: 68.9 kg.    Nutritional Assessment: Body mass index is 21.79 kg/m.Marland Kitchen Seen by dietician.  I agree with the assessment and plan as outlined below: Nutrition Status:        . Skin Assessment: I have examined the patient's skin and I agree with the wound assessment as performed by the wound care RN as outlined below:    Consultants:  None  Procedures:  None  Antimicrobials:  Anti-infectives (From admission, onward)    Start     Dose/Rate Route Frequency Ordered Stop   08/03/22 1500  piperacillin-tazobactam (ZOSYN) IVPB 2.25 g        2.25 g 100 mL/hr over 30 Minutes Intravenous Every 8 hours 08/03/22 1432           Subjective: Patient seen and examined.  He is feeling better than yesterday.  He was awake and had no complaints.  Oriented x 2.  Objective: Vitals:   08/02/22 2158 08/03/22 0311 08/03/22 0735 08/03/22 1124  BP: 137/70 (!) 102/56 (!) 116/59 (!) 96/51  Pulse: 94 71 72 (!) 59  Resp: 20 20 18 20    Temp: 98.7 F (37.1 C) 99.7 F (37.6 C) 99.4 F (37.4 C) 98.7 F (37.1 C)  TempSrc: Oral Oral Axillary Axillary  SpO2: 100% 100% 100% 100%  Weight: 68.9 kg     Height: 5\' 10"  (1.778 m)       Intake/Output Summary (Last 24 hours) at 08/03/2022 1446 Last data filed at 08/03/2022 0311 Gross per 24 hour  Intake 950.92 ml  Output 3000 ml  Net -2049.08 ml   Filed Weights   08/02/22 1334 08/02/22 2100 08/02/22 2158  Weight: 70.2 kg 68.9 kg 68.9 kg    Examination:  General exam: Appears slightly lethargic. Respiratory system: Diminished breath sounds with rhonchi. Respiratory effort normal. Cardiovascular system: S1 & S2 heard, RRR. No JVD, murmurs, rubs, gallops or clicks. No pedal edema. Gastrointestinal system: Abdomen is nondistended, soft and nontender. No organomegaly or masses felt. Normal bowel sounds heard. Central nervous system: Alert and oriented x 2. No focal neurological deficits. Extremities: Symmetric 5 x 5 power.   Data Reviewed: I have personally reviewed following labs and imaging studies  CBC: Recent Labs  Lab 08/02/22 1355 08/02/22 1413  08/03/22 0845  WBC 7.1  --  10.6*  NEUTROABS 5.4  --   --   HGB 8.6* 10.2* 9.6*  HCT 29.7* 30.0* 30.2*  MCV 99.7  --  92.9  PLT 174  --  366   Basic Metabolic Panel: Recent Labs  Lab 08/02/22 1355 08/02/22 1413 08/03/22 0710  NA 137 133* 136  K 7.1* 6.8* 4.2  CL 95* 100 89*  CO2 23  --  31  GLUCOSE 94 97 91  BUN 53* 49* 28*  CREATININE 12.41* 13.90* 8.52*  CALCIUM 8.6*  --  8.8*   GFR: Estimated Creatinine Clearance: 6.7 mL/min (A) (by C-G formula based on SCr of 8.52 mg/dL (H)). Liver Function Tests: Recent Labs  Lab 08/02/22 1355 08/03/22 0710  AST 127* 249*  ALT 86* 161*  ALKPHOS 68 61  BILITOT 0.8 0.7  PROT 7.5 7.1  ALBUMIN 3.0* 2.8*   No results for input(s): "LIPASE", "AMYLASE" in the last 168 hours. No results for input(s): "AMMONIA" in the last 168 hours. Coagulation Profile: No  results for input(s): "INR", "PROTIME" in the last 168 hours. Cardiac Enzymes: No results for input(s): "CKTOTAL", "CKMB", "CKMBINDEX", "TROPONINI" in the last 168 hours. BNP (last 3 results) No results for input(s): "PROBNP" in the last 8760 hours. HbA1C: No results for input(s): "HGBA1C" in the last 72 hours. CBG: Recent Labs  Lab 08/02/22 1414 08/02/22 1510 08/02/22 1919 08/02/22 2209 08/03/22 0311  GLUCAP 35* 108* 68* 79 91   Lipid Profile: No results for input(s): "CHOL", "HDL", "LDLCALC", "TRIG", "CHOLHDL", "LDLDIRECT" in the last 72 hours. Thyroid Function Tests: No results for input(s): "TSH", "T4TOTAL", "FREET4", "T3FREE", "THYROIDAB" in the last 72 hours. Anemia Panel: No results for input(s): "VITAMINB12", "FOLATE", "FERRITIN", "TIBC", "IRON", "RETICCTPCT" in the last 72 hours. Sepsis Labs: Recent Labs  Lab 08/02/22 1355 08/03/22 0710  PROCALCITON  --  8.10  LATICACIDVEN 2.6*  --     Recent Results (from the past 240 hour(s))  Resp Panel by RT-PCR (Flu A&B, Covid) Anterior Nasal Swab     Status: Abnormal   Collection Time: 08/02/22  2:51 PM   Specimen: Anterior Nasal Swab  Result Value Ref Range Status   SARS Coronavirus 2 by RT PCR NEGATIVE NEGATIVE Final    Comment: (NOTE) SARS-CoV-2 target nucleic acids are NOT DETECTED.  The SARS-CoV-2 RNA is generally detectable in upper respiratory specimens during the acute phase of infection. The lowest concentration of SARS-CoV-2 viral copies this assay can detect is 138 copies/mL. A negative result does not preclude SARS-Cov-2 infection and should not be used as the sole basis for treatment or other patient management decisions. A negative result may occur with  improper specimen collection/handling, submission of specimen other than nasopharyngeal swab, presence of viral mutation(s) within the areas targeted by this assay, and inadequate number of viral copies(<138 copies/mL). A negative result must be combined  with clinical observations, patient history, and epidemiological information. The expected result is Negative.  Fact Sheet for Patients:  EntrepreneurPulse.com.au  Fact Sheet for Healthcare Providers:  IncredibleEmployment.be  This test is no t yet approved or cleared by the [place] FDA and  has been authorized for detection and/or diagnosis of SARS-CoV-2 by FDA under an Emergency Use Authorization (EUA). This EUA will remain  in effect (meaning this test can be used) for the duration of the COVID-19 declaration under Section 564(b)(1) of the Act, 21 U.S.C.section 360bbb-3(b)(1), unless the authorization is terminated  or revoked sooner.  Influenza A by PCR POSITIVE (A) NEGATIVE Final   Influenza B by PCR NEGATIVE NEGATIVE Final    Comment: (NOTE) The Xpert Xpress SARS-CoV-2/FLU/RSV plus assay is intended as an aid in the diagnosis of influenza from Nasopharyngeal swab specimens and should not be used as a sole basis for treatment. Nasal washings and aspirates are unacceptable for Xpert Xpress SARS-CoV-2/FLU/RSV testing.  Fact Sheet for Patients: EntrepreneurPulse.com.au  Fact Sheet for Healthcare Providers: IncredibleEmployment.be  This test is not yet approved or cleared by the [place] FDA and has been authorized for detection and/or diagnosis of SARS-CoV-2 by FDA under an Emergency Use Authorization (EUA). This EUA will remain in effect (meaning this test can be used) for the duration of the COVID-19 declaration under Section 564(b)(1) of the Act, 21 U.S.C. section 360bbb-3(b)(1), unless the authorization is terminated or revoked.  Performed at [facility], [place] [address], [place], [place] [address]   MRSA Next Gen by PCR, Nasal     Status: None   Collection Time: 08/02/22 10:02 PM   Specimen: Nasal Mucosa; Nasal Swab  Result Value Ref Range Status   MRSA by PCR Next Gen  NOT DETECTED NOT DETECTED Final    Comment: (NOTE) The GeneXpert MRSA Assay (FDA approved for NASAL specimens only), is one component of a comprehensive MRSA colonization surveillance program. It is not intended to diagnose MRSA infection nor to guide or monitor treatment for MRSA infections. Test performance is not FDA approved in patients less than 46 years old. Performed at [facility], [place] [address] [place], [place] [address]      Radiology Studies: DG Chest [place] 1 View  Result Date: 08/03/2022 CLINICAL DATA:  81 year old male with altered mental status and shortness of breath. Dialysis patient, recently missed dialysis. EXAM: PORTABLE CHEST 1 VIEW COMPARISON:  Portable chest 08/02/2022 and earlier. FINDINGS: Portable AP semi upright view at 0620 hours. Stable large lung volumes, cardiomegaly, mediastinal contours. Stable right medial subclavian region vascular stent. Patchy and asymmetric scattered bilateral pulmonary opacity has mildly improved since yesterday. Geographic increased density in the right lower chest may be related to small volume loculated pleural fluid as suspected previously, stable. No new confluent opacity. Stable visualized osseous structures. IMPRESSION: 1. Mildly improved bilateral ventilation since yesterday, favor regression of pulmonary edema. 2. Geographic opacity projecting over the right lower lung is stable, possibly fluid loculated in a right pleural fissure. 3. No other pleural effusion or new cardiopulmonary abnormality identified. Electronically Signed   By: Genevie Ann M.D.   On: 08/03/2022 06:48   CT HEAD WO CONTRAST (5MM)  Result Date: 08/02/2022 CLINICAL DATA:  Mental status change, unknown cause. EXAM: CT HEAD WITHOUT CONTRAST TECHNIQUE: Contiguous axial images were obtained from the base of the skull through the vertex without intravenous contrast. RADIATION DOSE REDUCTION: This exam was performed according to the departmental dose-optimization  program which includes automated exposure control, adjustment of the mA and/or kV according to patient size and/or use of iterative reconstruction technique. COMPARISON:  07/04/2022. FINDINGS: Brain: No acute intracranial hemorrhage, midline shift or mass effect. No extra-axial fluid collection. Mild atrophy is noted. Periventricular white matter hypodensities are present bilaterally. No hydrocephalus. Vascular: Carotid artery calcifications.  No hyperdense vessel. Skull: Normal. Negative for fracture or focal lesion. Sinuses/Orbits: Mild mucosal thickening in the ethmoid air cells, right maxillary sinus, and left sphenoid sinus. No acute orbital abnormality. Other: None. IMPRESSION: 1. No acute intracranial process. 2. Atrophy with chronic microvascular ischemic changes. Electronically  Signed   By: Brett Fairy M.D.   On: 08/02/2022 23:51   DG Chest Portable 1 View  Result Date: 08/02/2022 CLINICAL DATA:  Altered mental status, cough, missed dialysis Saturday, drowsy EXAM: PORTABLE CHEST 1 VIEW COMPARISON:  Portable exam 1430 hours compared to 07/04/2022 FINDINGS: Stent identified at RIGHT brachiocephalic vein. Enlargement of cardiac silhouette with pulmonary vascular congestion. Scratch at loculated effusions at the apices. Patchy BILATERAL pulmonary infiltrates favoring pulmonary edema. Small basilar effusion inferior RIGHT hemithorax, decreased. More focal area of opacity in the mid to lower RIGHT lung is seen, likely represents loculated effusion at RIGHT major fissure, unchanged. Osseous demineralization. IMPRESSION: Enlargement of cardiac silhouette with pulmonary vascular congestion and probable diffuse pulmonary edema. BILATERAL pleural effusions larger on RIGHT with an area of focal opacity in the mid to lower RIGHT lung, favor loculated effusion as seen on the prior exam. Electronically Signed   By: Lavonia Dana M.D.   On: 08/02/2022 14:45    Scheduled Meds:  amLODipine  10 mg Oral Daily    aspirin EC  81 mg Oral Daily   atropine  1 drop Both Eyes TID   brinzolamide  1 drop Both Eyes TID   And   brimonidine  1 drop Both Eyes TID   [START ON 08/04/2022] calcitRIOL  2.75 mcg Oral Q M,W,F-HD   calcium gluconate  1 g Intravenous Once   carvedilol  12.5 mg Oral BID WC   Chlorhexidine Gluconate Cloth  6 each Topical Q0600   [START ON 08/04/2022] cinacalcet  90 mg Oral Q M,W,F   insulin aspart  10 Units Intravenous Once   And   dextrose  1 ampule Intravenous Once   heparin  5,000 Units Subcutaneous Q12H   latanoprost  1 drop Both Eyes QHS   ofloxacin  1 drop Both Eyes QID   pantoprazole  40 mg Oral Daily   prednisoLONE acetate  1 drop Both Eyes Q4H   sevelamer carbonate  1,600 mg Oral TID WC   timolol  1 drop Both Eyes Daily   Continuous Infusions:  dextrose 30 mL/hr at 08/02/22 2356   furosemide     piperacillin-tazobactam (ZOSYN)  IV       LOS: 1 day   Darliss Cheney, MD [facility]  08/03/2022, 2:46 PM   *Please note that this is a verbal dictation therefore any spelling or grammatical errors are due to the "[place]" system interpretation.  Please page via [place] and do not message via secure chat for urgent patient care matters. Secure chat can be used for non urgent patient care matters.  How to contact the [facility] Attending or Consulting provider [place] or covering provider during after hours [place], for this patient?  Check the care team in [facility] and look for a) attending/consulting [facility] provider listed and b) the [facility] team listed. Page or secure chat [phone]. Log into [email or web address] and use [facility] [place]'s universal password to access. If you do not have the password, please contact the hospital operator. Locate the [facility] provider you are looking for under [facility] and page to a number that you can be directly reached. If you still have difficulty reaching the provider, please page the [facility] (Director on Call) for the Hospitalists listed on amion for  assistance.

## 2022-08-03 NOTE — TOC Progression Note (Addendum)
Transition of Care [facility]) - Progression Note    Patient Details  Name: Travis [name] MRN: [id number] Date of Birth: 07-09-1941  [facility] CM/SW Contact  Zenon Mayo, RN Phone Number: 08/03/2022, 2:00 PM  Clinical Narrative:    from home with wife, CHF, Flu, SOB , AMS, missed HD, CT head is negative, getting d10, has dementia at baseline, he is lethargic.  Per pt eval rec SNF.          Expected Discharge Plan and Services                                                 Social Determinants of Health (SDOH) Interventions    Readmission Risk Interventions    07/05/2022   12:00 PM 12/22/2021    5:33 PM 05/26/2021   12:03 PM  Readmission Risk Prevention Plan  Transportation Screening Complete Complete Complete  PCP or Specialist Appt within 3-5 Days  Complete Complete  HRI or Home Care Consult Complete Complete Complete  Social Work Consult for [place] Planning/Counseling Complete Complete Complete  Palliative Care Screening Not Applicable Not Applicable Not Applicable  Medication Review (RN Care Manager) Referral to Pharmacy Complete Complete

## 2022-08-03 NOTE — Progress Notes (Signed)
   08/02/22 2100  Vitals  Temp 99 F (37.2 C)  Temp Source Oral  BP (!) 172/67  MAP (mmHg) 112  BP Location Left Arm  BP Method Automatic  Patient Position (if appropriate) Lying  Pulse Rate Source Monitor  ECG Heart Rate 96  Resp 18  Post Treatment  Dialyzer Clearance Lightly streaked  Duration of HD Treatment -hour(s) 4 hour(s)  Liters Processed 96  Fluid Removed (mL) 3000 mL  Tolerated HD Treatment Yes  AVG/AVF Arterial Site Held (minutes) 5 minutes  AVG/AVF Venous Site Held (minutes) 5 minutes   TX fin. W/o difficulty. Was in ED, but ED change to an unclean room  that took quite a long time to get clean for transfer to room 2C16.

## 2022-08-03 NOTE — Progress Notes (Signed)
Admit: 08/02/2022 LOS: 1  32M ESRD on iHD with AHRF, Severe hyperkalemia, flu a positive  Subjective:  Flu A positive HD yesterday 3L UF, K improved today Feels better. Family at bedside   11/20 0701 - 11/21 0700 In: 950.9 [I.V.:950.9] Out: 3000   Filed Weights   08/02/22 1334 08/02/22 2100 08/02/22 2158  Weight: 70.2 kg 68.9 kg 68.9 kg    Scheduled Meds:  amLODipine  10 mg Oral Daily   aspirin EC  81 mg Oral Daily   atropine  1 drop Both Eyes TID   brinzolamide  1 drop Both Eyes TID   And   brimonidine  1 drop Both Eyes TID   calcium gluconate  1 g Intravenous Once   carvedilol  12.5 mg Oral BID WC   Chlorhexidine Gluconate Cloth  6 each Topical Q0600   insulin aspart  10 Units Intravenous Once   And   dextrose  1 ampule Intravenous Once   heparin  5,000 Units Subcutaneous Q12H   latanoprost  1 drop Both Eyes QHS   ofloxacin  1 drop Both Eyes QID   pantoprazole  40 mg Oral Daily   prednisoLONE acetate  1 drop Both Eyes Q4H   sevelamer carbonate  1,600 mg Oral TID WC   timolol  1 drop Both Eyes Daily   Continuous Infusions:  dextrose 30 mL/hr at 08/02/22 2356   furosemide     PRN Meds:.hydrALAZINE  Current Labs: reviewed   Physical Exam:  Blood pressure (!) 116/59, pulse 72, temperature 99.4 F (37.4 C), temperature source Axillary, resp. rate 18, height 5\' 10"  (1.778 m), weight 68.9 kg, SpO2 100 %. GEN: Elderly male, more comfortable appearing, eating breakfast ENT: Some temporal wasting EYES: Eyes sunken CV: Regular, normal S1 and S2 PULM: lessened respiratory effort, diminished in the bases ABD: Soft, nontender SKIN: No rashes or lesions EXT: No significant edema Right forearm graft with bruit and thrill   Outpt HD Orders Unit: [facility] Days: MWF Time: 3.5h Dialyzer: F180 EDW: 69.8kg K/Ca: 2/2 Access: RFA AVG Needle Size: 15g x2 BFR/DFR: 400 / a1.5 UF Proflie: profile 2      VDRA / calcimimetic: calcitriol 2.19mcg qTx; sensipar 90mg  qTx EPO:  Mircera 20mcg q2wk; last given 11/15 IV Fe: Venofer 100mg  x5 due now Heparin: none   Assessment 32M ESRD on iHD with AHRF, Severe hyperkalemia, febrile illness   ESRD MWF [facility] R FA AVG: HD MWF this week even though on holiday as already is disrupted Hyperkalemia: Resovled with HD Influenza A / AHRF / Pulm Edema: Improved.  HD 11/22 Flu A per TRH Anemia: Hb stable BMD: cont C3 and sensipar  HTN/Vol: as above        Plan HD 11/22.   Medication Issues; Preferred narcotic agents for pain control are hydromorphone, fentanyl, and methadone. Morphine should not be used.  Baclofen should be avoided Avoid oral sodium phosphate and magnesium citrate based laxatives / bowel preps    Pearson Grippe MD 08/03/2022, 9:56 AM  Recent Labs  Lab 08/02/22 1355 08/02/22 1413 08/03/22 0710  NA 137 133* 136  K 7.1* 6.8* 4.2  CL 95* 100 89*  CO2 23  --  31  GLUCOSE 94 97 91  BUN 53* 49* 28*  CREATININE 12.41* 13.90* 8.52*  CALCIUM 8.6*  --  8.8*   Recent Labs  Lab 08/02/22 1355 08/02/22 1413 08/03/22 0845  WBC 7.1  --  10.6*  NEUTROABS 5.4  --   --  HGB 8.6* 10.2* 9.6*  HCT 29.7* 30.0* 30.2*  MCV 99.7  --  92.9  PLT 174  --  166

## 2022-08-03 NOTE — Evaluation (Addendum)
Physical Therapy Evaluation Patient Details Name: Travis [name] MRN: [id number] DOB: 01/17/1941 Today's Date: 08/03/2022  History of Present Illness  Pt is a 81 y.o. M who presents 08/02/2022 with acute respiratory failure with hypoxia secondary to acute on chronic HF rEF/bilateral pleural effusions, hyperkalemia, influenza A PNA, acute metabolic encephalopathy, HTN emergency, hypoglycemia. 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 PTA, pt uses rolling walker for household ambulation and requires assist for ADL's. Pt typically lives with his spouse and has 5 days/wk aide assist, however, per chart review, pt spouse has also been battling health issues and has been absent 3+ weeks. Pt presents with decreased functional mobility secondary to generalized weakness, impaired balance, decreased cognition, right eye visual deficit and decreased activity tolerance. Pt requiring moderate assist for bed mobility and transfers from bed to chair using RW. Based on deficits and decreased caregiver support, recommend ST SNF at discharge. Will continue to assess.     Recommendations for follow up therapy are one component of a multi-disciplinary discharge planning process, led by the attending physician.  Recommendations may be updated based on patient status, additional functional criteria and insurance authorization.  Follow Up Recommendations Skilled nursing-short term rehab (<3 hours/day)      Assistance Recommended at Discharge Frequent or constant Supervision/Assistance  Patient can return home with the following  A lot of help with walking and/or transfers;A lot of help with bathing/dressing/bathroom;Assistance with cooking/housework;Assist for transportation;Direct supervision/assist for medications management;Direct supervision/assist for financial  management;Help with stairs or ramp for entrance    Equipment Recommendations Wheelchair (measurements PT);Wheelchair cushion (measurements PT)  Recommendations for Other Services       Functional Status Assessment Patient has had a recent decline in their functional status and demonstrates the ability to make significant improvements in function in a reasonable and predictable amount of time.     Precautions / Restrictions Precautions Precautions: Fall Restrictions Weight Bearing Restrictions: No      Mobility  Bed Mobility Overal bed mobility: Needs Assistance Bed Mobility: Supine to Sit     Supine to sit: Mod assist     General bed mobility comments: Decreased initiation, hand over hand guidance for use of bed rail, assist at trunk to power up    Transfers Overall transfer level: Needs assistance Equipment used: Rolling walker (2 wheels) Transfers: Sit to/from Stand, Bed to chair/wheelchair/BSC Sit to Stand: Mod assist Stand pivot transfers: Min assist         General transfer comment: ModA to boost up to standing from elevated bed height, increased bilateral knee flexion with cues for quad activation. Pivoting towards right with cues for sequencing/technique    Ambulation/Gait                  Stairs            Wheelchair Mobility    Modified Rankin (Stroke Patients Only)       Balance Overall balance assessment: Needs assistance Sitting-balance support: Feet supported Sitting balance-Leahy Scale: Fair     Standing balance support: Bilateral upper extremity supported Standing balance-Leahy Scale: Poor Standing balance comment: reliant on BUE support                             Pertinent Vitals/Pain Pain Assessment Pain Assessment: Faces Faces Pain Scale: No hurt  Home Living Family/patient expects to be discharged to:: Private residence Living Arrangements: Spouse/significant other Available Help at Discharge:  Family;Available 24 hours/day;Personal care attendant Type of Home: House Home Access: Stairs to enter;Ramped entrance Entrance Stairs-Rails: Right Entrance Stairs-Number of Steps: 6   Home Layout: One level Home Equipment: Cane - single point;Rollator (4 wheels);Other (comment);Rolling Walker (2 wheels);Toilet riser Additional Comments: Nurse from [place] comes 5 days/week to transport pt to HD 3x/week and then on days that pt does not get HD he comes from 9:00-4:30    Prior Function Prior Level of Function : Needs assist             Mobility Comments: Uses bil platform rollator in community, RW in home and intermittently furniture surfs. Fell ~6 months prior. ADLs Comments: Aide helps in household chores, transportation, bathing, grooming, and intermittently with dressing     Hand Dominance   Dominant Hand: Left    Extremity/Trunk Assessment   Upper Extremity Assessment Upper Extremity Assessment: Generalized weakness    Lower Extremity Assessment Lower Extremity Assessment: Generalized weakness       Communication   Communication: No difficulties  Cognition Arousal/Alertness: Lethargic Behavior During Therapy: Flat affect Overall Cognitive Status: No family/caregiver present to determine baseline cognitive functioning                                 General Comments: Pt lethargic, able to arouse with min-mod stimulation.        General Comments      Exercises     Assessment/Plan    PT Assessment Patient needs continued PT services  PT Problem List Decreased strength;Decreased activity tolerance;Decreased balance;Decreased mobility;Decreased cognition;Decreased safety awareness       PT Treatment Interventions DME instruction;Gait training;Functional mobility training;Therapeutic activities;Therapeutic exercise;Balance training;Patient/family education    PT Goals (Current goals can be found in the Care Plan section)  Acute Rehab PT  Goals Patient Stated Goal: did not state PT Goal Formulation: With patient Time For Goal Achievement: 08/17/22 Potential to Achieve Goals: Good    Frequency Min 3X/week     Co-evaluation               AM-PAC PT "6 Clicks" Mobility  Outcome Measure Help needed turning from your back to your side while in a flat bed without using bedrails?: A Little Help needed moving from lying on your back to sitting on the side of a flat bed without using bedrails?: A Lot Help needed moving to and from a bed to a chair (including a wheelchair)?: A Lot Help needed standing up from a chair using your arms (e.g., wheelchair or bedside chair)?: A Lot Help needed to walk in hospital room?: Total Help needed climbing 3-5 steps with a railing? : Total 6 Click Score: 11    End of Session Equipment Utilized During Treatment: Gait belt Activity Tolerance: Patient tolerated treatment well Patient left: in chair;with call bell/phone within reach Nurse Communication: Mobility status PT Visit Diagnosis: Unsteadiness on feet (R26.81);Muscle weakness (generalized) (M62.81);Difficulty in walking, not elsewhere classified (R26.2)    Time: 9449-6759 PT Time Calculation (min) (ACUTE ONLY): 28 min   Charges:   PT Evaluation $PT Eval Moderate Complexity: 1 Mod PT Treatments $Therapeutic Activity: 8-22 mins        Wyona Almas, PT, DPT Acute Rehabilitation Services Office [phone]   Deno Etienne 08/03/2022, 4:52 PM

## 2022-08-04 DIAGNOSIS — I5023 Acute on chronic systolic (congestive) heart failure: Secondary | ICD-10-CM | POA: Diagnosis not present

## 2022-08-04 LAB — RENAL FUNCTION PANEL
Albumin: 2.5 g/dL — ABNORMAL LOW (ref 3.5–5.0)
Anion gap: 18 — ABNORMAL HIGH (ref 5–15)
BUN: 47 mg/dL — ABNORMAL HIGH (ref 8–23)
CO2: 28 mmol/L (ref 22–32)
Calcium: 8.5 mg/dL — ABNORMAL LOW (ref 8.9–10.3)
Chloride: 87 mmol/L — ABNORMAL LOW (ref 98–111)
Creatinine, Ser: 10.42 mg/dL — ABNORMAL HIGH (ref 0.61–1.24)
GFR, Estimated: 5 mL/min — ABNORMAL LOW (ref >60)
Glucose, Bld: 85 mg/dL (ref 70–99)
Phosphorus: 5.2 mg/dL — ABNORMAL HIGH (ref 2.5–4.6)
Potassium: 4.5 mmol/L (ref 3.5–5.1)
Sodium: 133 mmol/L — ABNORMAL LOW (ref 135–145)

## 2022-08-04 LAB — CBC
HCT: 24.6 % — ABNORMAL LOW (ref 39.0–52.0)
Hemoglobin: 7.9 g/dL — ABNORMAL LOW (ref 13.0–17.0)
MCH: 29.5 pg (ref 26.0–34.0)
MCHC: 32.1 g/dL (ref 30.0–36.0)
MCV: 91.8 fL (ref 80.0–100.0)
Platelets: 147 10*3/uL — ABNORMAL LOW (ref 150–400)
RBC: 2.68 MIL/uL — ABNORMAL LOW (ref 4.22–5.81)
RDW: 15.9 % — ABNORMAL HIGH (ref 11.5–15.5)
WBC: 7.6 10*3/uL (ref 4.0–10.5)
nRBC: 0.3 % — ABNORMAL HIGH (ref 0.0–0.2)

## 2022-08-04 LAB — HEPATITIS B SURFACE ANTIBODY, QUANTITATIVE: Hep B S AB Quant (Post): 799.4 m[IU]/mL (ref >9.9)

## 2022-08-04 LAB — PROCALCITONIN: Procalcitonin: 11.18 ng/mL

## 2022-08-04 MED ORDER — LIDOCAINE-PRILOCAINE 2.5-2.5 % EX CREA: 1.0000 | TOPICAL_CREAM | CUTANEOUS | Status: DC | PRN

## 2022-08-04 MED ORDER — LIDOCAINE HCL (PF) 1 % IJ SOLN: 5.0000 mL | INTRAMUSCULAR | Status: DC | PRN

## 2022-08-04 MED ORDER — PENTAFLUOROPROP-TETRAFLUOROETH EX AERO: 1.0000 | INHALATION_SPRAY | CUTANEOUS | Status: DC | PRN

## 2022-08-04 MED ORDER — CARVEDILOL 3.125 MG PO TABS
3.1250 mg | ORAL_TABLET | Freq: Two times a day (BID) | ORAL | Status: DC
  Administered 2022-08-04 – 2022-08-05 (×2): 3.125 mg via ORAL
  Filled 2022-08-04 (×3): qty 1

## 2022-08-04 NOTE — NC FL2 (Signed)
[place] MEDICAID FL2 LEVEL OF CARE SCREENING TOOL     IDENTIFICATION  Patient Name: Travis [name] Birthdate: [birth year] Sex: male Admission Date (Current Location): 08/02/2022  [facility] and [place] Number:  Herbalist and Address:  [facility], [place] [address], [place], [place] [address]      Provider Number: [id number]  Attending Physician Name and Address:  Darliss Cheney, MD  Relative Name and Phone Number:  Priest, Lockridge (Spouse)  [phone] (Mobile)    Current Level of Care: Hospital Recommended Level of Care: [place] Prior Approval Number:    Date Approved/Denied:   PASRR Number: [id number]  Discharge Plan: SNF    Current Diagnoses: Patient Active Problem List   Diagnosis Date Noted   Influenza A 08/03/2022   Hyperkalemia 08/02/2022   CHF (congestive heart failure) ([place]) 08/02/2022   Coronary artery disease 07/06/2022   Elevated troponin 07/05/2022   Prolonged QT interval 07/05/2022   GERD (gastroesophageal reflux disease) 07/05/2022   ACS (acute coronary syndrome) [facility])    NSTEMI (non-ST elevated myocardial infarction) ([place]) 07/04/2022   Delirium 12/22/2021   Hypertensive encephalopathy 12/20/2021   End stage renal disease on dialysis [facility])    Hypertensive emergency 12/18/2021   Localized scleroderma (morphea) 84/69/6295   Chronic systolic CHF (congestive heart failure), NYHA class 2 ([place]) 11/05/2015   Chest pain    Thrombocytopenia ([place]) 08/11/2015   FTT (failure to thrive) in adult 28/41/3244   Acute systolic CHF (congestive heart failure) ([place]) 04/25/2015   HTN (hypertension) 04/19/2015   Protein-calorie malnutrition, severe ([place]) 04/19/2015   Acute on chronic diastolic CHF (congestive heart failure) ([place]) 04/18/2015   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   ESRD on dialysis [facility])    Palliative care encounter    Acute DVT of right tibial vein ([place]) 01/29/2015   Physical deconditioning  01/28/2015   Sjogren's disease ([place]) 01/28/2015   Normocytic anemia 12/17/2014    Orientation RESPIRATION BLADDER Height & Weight     Self, Time, Situation, Place  Normal Continent Weight: 149 lb 4 oz (67.7 kg) Height:  5\' 10"  (177.8 cm)  BEHAVIORAL SYMPTOMS/MOOD NEUROLOGICAL BOWEL NUTRITION STATUS      Continent Diet (see d/c summary)  AMBULATORY STATUS COMMUNICATION OF NEEDS Skin   Extensive Assist Verbally Normal                       Personal Care Assistance Level of Assistance  Bathing, Feeding, Dressing Bathing Assistance: Limited assistance Feeding assistance: Independent Dressing Assistance: Limited assistance     Functional Limitations Info  Sight, Hearing, Speech Sight Info: Impaired Hearing Info: Adequate Speech Info: Adequate    SPECIAL CARE FACTORS FREQUENCY  PT (By licensed PT), OT (By licensed OT)     PT Frequency: 5x/week OT Frequency: 5x/week            Contractures Contractures Info: Not present    Additional Factors Info  Code Status, Allergies, Isolation Precautions Code Status Info: Full code Allergies Info: no active allergies     Isolation Precautions Info: Droplet precautions, positive for Influenza A on 08/02/22     Current Medications (08/04/2022):  This is the current hospital active medication list Current Facility-Administered Medications  Medication Dose Route Frequency Provider Last Rate Last Admin   amLODipine (NORVASC) tablet 10 mg  10 mg Oral Daily Wynetta Fines T, MD   10 mg at 08/04/22 1218   aspirin EC tablet 81 mg  81 mg Oral  Daily Wynetta Fines T, MD   81 mg at 08/04/22 1218   atropine 1 % ophthalmic solution 1 drop  1 drop Both Eyes TID Wynetta Fines T, MD   1 drop at 08/04/22 1223   brinzolamide (AZOPT) 1 % ophthalmic suspension 1 drop  1 drop Both Eyes TID Wynetta Fines T, MD   1 drop at 08/04/22 1222   And   brimonidine (ALPHAGAN) 0.2 % ophthalmic solution 1 drop  1 drop Both Eyes TID Wynetta Fines T, MD   1 drop at  08/04/22 1222   calcitRIOL (ROCALTROL) capsule 2.75 mcg  2.75 mcg Oral Q M,W,F-HD Pearson Grippe B, MD   2.75 mcg at 08/04/22 1128   carvedilol (COREG) tablet 3.125 mg  3.125 mg Oral BID WC Darliss Cheney, MD   3.125 mg at 08/04/22 1319   Chlorhexidine Gluconate Cloth 2 % PADS 6 each  6 each Topical Q0600 Rexene Agent, MD   6 each at 08/04/22 0651   cinacalcet (SENSIPAR) tablet 90 mg  90 mg Oral Q M,W,F Kaleen Mask, RPH       heparin injection 5,000 Units  5,000 Units Subcutaneous Q12H Wynetta Fines T, MD   5,000 Units at 08/04/22 1236   hydrALAZINE (APRESOLINE) injection 5 mg  5 mg Intravenous Q6H PRN Wynetta Fines T, MD       latanoprost (XALATAN) 0.005 % ophthalmic solution 1 drop  1 drop Both Eyes QHS Wynetta Fines T, MD   1 drop at 08/03/22 2223   ofloxacin (OCUFLOX) 0.3 % ophthalmic solution 1 drop  1 drop Both Eyes QID Wynetta Fines T, MD   1 drop at 08/04/22 1224   oseltamivir (TAMIFLU) capsule 30 mg  30 mg Oral Q M,W,F Pahwani, Einar Grad, MD       pantoprazole (PROTONIX) EC tablet 40 mg  40 mg Oral Daily Wynetta Fines T, MD   40 mg at 08/04/22 1218   piperacillin-tazobactam (ZOSYN) IVPB 2.25 g  2.25 g Intravenous Q8H Kaleen Mask, RPH 100 mL/hr at 08/04/22 0647 2.25 g at 08/04/22 0647   prednisoLONE acetate (PRED FORTE) 1 % ophthalmic suspension 1 drop  1 drop Both Eyes Q4H Wynetta Fines T, MD   1 drop at 08/04/22 1225   sevelamer carbonate (RENVELA) tablet 1,600 mg  1,600 mg Oral TID WC Wynetta Fines T, MD   1,600 mg at 08/04/22 1226   timolol (TIMOPTIC-XR) 0.5 % ophthalmic gel-forming 1 drop  1 drop Both Eyes Daily Lequita Halt, MD   1 drop at 08/04/22 1224     Discharge Medications: Please see discharge summary for a list of discharge medications.  Relevant Imaging Results:  Relevant Lab Results:   Additional Information SSN# [id number] Pt has HD at [facility]  MWF. Patient has [place], LCSW

## 2022-08-04 NOTE — TOC Initial Note (Signed)
Transition of Care [facility]) - Initial/Assessment Note    Patient Details  Name: Travis [name] MRN: [id number] Date of Birth: [birth year]  [facility]) CM/SW Contact:    Bethann Berkshire, [place] Phone Number: 08/04/2022, 1:53 PM  Clinical Narrative:                   CSW met with pt to discuss SNF rec. Pt lives at home with his spouse in [place]. States he has been to a SNF before in the past but does not remember name/location. Pt is agreeable to SNF workup though states he plans to talk with his wife regarding decision. He consents to [place] updating his spouse. Fl2 completed and bed requests faxed in hub.   CSW called pt's spouse to update. Spouse states that they would like for pt to return home. Daughter, [name] is also on the phone and states that pt and wife live at home together and that Daughter is there assisting as well. Pt has a walker at home. Pt's wife just got out of hospital and has [facility] [place] with [facility]. They would like for pt to be set up with [facility] with [facility] also if possible.  Expected Discharge Plan: Skilled Nursing Facility Barriers to Discharge: Continued Medical Work up   Patient Goals and CMS Choice        Expected Discharge Plan and Services Expected Discharge Plan: [place] arrangements for the past 2 months: Single Family Home                                      Prior Living Arrangements/Services Living arrangements for the past 2 months: Single Family Home Lives with:: Spouse Patient language and need for interpreter reviewed:: Yes        Need for Family Participation in Patient Care: Yes (Comment) Care giver support system in place?: Yes (comment)   Criminal Activity/Legal Involvement Pertinent to Current Situation/Hospitalization: No - Comment as needed  Activities of Daily Living      Permission Sought/Granted   Permission granted to share information with : Yes, Verbal Permission  Granted  Share Information with NAME: Torrian, [place] (Spouse)  [phone] (Mobile)           Emotional Assessment Appearance:: Appears stated age Attitude/Demeanor/Rapport: Lethargic, Engaged Affect (typically observed): Accepting, Appropriate Orientation: : Oriented to Self, Oriented to Place, Oriented to  Time, Oriented to Situation Alcohol / Substance Use: Not Applicable Psych Involvement: No (comment)  Admission diagnosis:  Hyperkalemia [E87.5] CHF (congestive heart failure) (HCC) [I50.9] Acute pulmonary edema (HCC) [J81.0] Hypoglycemia [E16.2] Chronic anemia [D64.9] ESRD on hemodialysis ([place]) [N18.6, Z99.2] Patient Active Problem List   Diagnosis Date Noted   Influenza A 08/03/2022   Hyperkalemia 08/02/2022   CHF (congestive heart failure) ([place]) 08/02/2022   Coronary artery disease 07/06/2022   Elevated troponin 07/05/2022   Prolonged QT interval 07/05/2022   GERD (gastroesophageal reflux disease) 07/05/2022   ACS (acute coronary syndrome) [facility])    NSTEMI (non-ST elevated myocardial infarction) ([place]) 07/04/2022   Delirium 12/22/2021   Hypertensive encephalopathy 12/20/2021   End stage renal disease on dialysis [facility])    Hypertensive emergency 12/18/2021   Localized scleroderma (morphea) 33/82/5053   Chronic systolic CHF (congestive heart failure), NYHA class 2 ([place]) 11/05/2015   Chest pain    Thrombocytopenia ([place]) 08/11/2015   FTT (failure to thrive) in adult  45/11/8880   Acute systolic CHF (congestive heart failure) ([place]) 04/25/2015   HTN (hypertension) 04/19/2015   Protein-calorie malnutrition, severe ([place]) 04/19/2015   Acute on chronic diastolic CHF (congestive heart failure) ([place]) 04/18/2015   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   ESRD on dialysis [facility])    Palliative care encounter    Acute DVT of right tibial vein ([place]) 01/29/2015   Physical deconditioning 01/28/2015   Sjogren's disease ([place]) 01/28/2015   Normocytic anemia 12/17/2014   PCP:   [name], MD Pharmacy:   [id number] [address] [place]Phone: [phone]: [address]Phone: [phone]: [phone] [facility] [address]Phone: [phone]: [phone]    Social Determinants of Health (SDOH) Interventions    Readmission Risk Interventions    07/05/2022   12:00 PM 12/22/2021    5:33 PM 05/26/2021   12:03 PM  Readmission Risk Prevention Plan  Transportation Screening Complete Complete Complete  PCP or Specialist Appt within 3-5 Days  Complete Complete  HRI or Home Care Consult Complete Complete Complete  Social Work Consult for [place]/Counseling Complete Complete Complete  Palliative Care Screening Not Applicable Not Applicable Not Applicable  Medication Review (Press photographer Referral to Pharmacy Complete Complete

## 2022-08-04 NOTE — Plan of Care (Signed)
  Problem: Cardiovascular: Goal: Ability to achieve and maintain adequate cardiovascular perfusion will improve Outcome: Progressing   Problem: Health Behavior/Discharge Planning: Goal: Ability to safely manage health-related needs after discharge will improve Outcome: Progressing   Problem: Education: Goal: Knowledge of cardiac device and self-care will improve Outcome: Progressing Goal: Ability to safely manage health related needs after discharge will improve Outcome: Progressing Goal: Individualized Educational Video(s) Outcome: Progressing

## 2022-08-04 NOTE — Evaluation (Signed)
Occupational Therapy Evaluation Patient Details Name: Travis [name] MRN: [id number] DOB: [birth year] Today's Date: 08/04/2022   History of Present Illness Pt is a 81 y.o. M who presents 08/02/2022 with acute respiratory failure with hypoxia secondary to acute on chronic HF rEF/bilateral pleural effusions, hyperkalemia, influenza A PNA, acute metabolic encephalopathy, HTN emergency, hypoglycemia. 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 He has baseline low vision. Pt is aware of why he is in the hospital and recalls that the MD stated he needs to go to a SNF for rehab. Pt presents with significant weakness, decreased standing balance and impaired cognition. He requires min to total assist for ADLs and moderate assistance to stand. He is able to transfer with min assist once on his feet. Recommend ST rehab in SNF prior to return home.      Recommendations for follow up therapy are one component of a multi-disciplinary discharge planning process, led by the attending physician.  Recommendations may be updated based on patient status, additional functional criteria and insurance authorization.   Follow Up Recommendations  Skilled nursing-short term rehab (<3 hours/day)     Assistance Recommended at Discharge Frequent or constant Supervision/Assistance  Patient can return home with the following Two people to help with walking and/or transfers;A lot of help with bathing/dressing/bathroom;Assistance with cooking/housework;Assistance with feeding;Direct supervision/assist for medications management;Direct supervision/assist for financial management;Assist for transportation;Help with stairs or ramp for entrance    Functional Status Assessment   Patient has had a recent decline in their functional status and demonstrates the ability to make significant improvements in function in a reasonable and predictable amount of time.  Equipment Recommendations  None recommended by OT    Recommendations for Other Services       Precautions / Restrictions Precautions Precautions: Fall Restrictions Weight Bearing Restrictions: No      Mobility Bed Mobility Overal bed mobility: Needs Assistance Bed Mobility: Supine to Sit     Supine to sit: Max assist     General bed mobility comments: decreased initiation, multimodal cues to sequence    Transfers Overall transfer level: Needs assistance Equipment used: Rolling walker (2 wheels)   Sit to Stand: Mod assist, From elevated surface Stand pivot transfers: Min assist         General transfer comment: heavy mod assist to stand and gain balance, steadying assist to transfer      Balance Overall balance assessment: Needs assistance Sitting-balance support: Feet supported Sitting balance-Leahy Scale: Fair     Standing balance support: Bilateral upper extremity supported Standing balance-Leahy Scale: Poor Standing balance comment: reliant on BUE and external support                           ADL either performed or assessed with clinical judgement   ADL Overall ADL's : Needs assistance/impaired Eating/Feeding: Minimal assistance;Sitting   Grooming: Wash/dry hands;Sitting;Minimal assistance   Upper Body Bathing: Maximal assistance;Sitting   Lower Body Bathing: Total assistance;Sit to/from stand   Upper Body Dressing : Maximal assistance;Sitting   Lower Body Dressing: Total assistance;Sit to/from stand   Toilet Transfer: Minimal assistance;Stand-pivot;Rolling walker (2 wheels)   Toileting- Clothing Manipulation and Hygiene: Total assistance;Sit to/from stand  Vision Patient Visual Report: No change from baseline Additional Comments:  low vision, color blind     Perception     Praxis      Pertinent Vitals/Pain Pain Assessment Pain Assessment: No/denies pain     Hand Dominance Left   Extremity/Trunk Assessment Upper Extremity Assessment Upper Extremity Assessment: Generalized weakness   Lower Extremity Assessment Lower Extremity Assessment: Defer to PT evaluation   Cervical / Trunk Assessment Cervical / Trunk Assessment: Kyphotic   Communication Communication Communication: No difficulties   Cognition Arousal/Alertness: Awake/alert Behavior During Therapy: Flat affect Overall Cognitive Status: No family/caregiver present to determine baseline cognitive functioning                                 General Comments: more awake once OOB     General Comments       Exercises     Shoulder Instructions      Home Living Family/patient expects to be discharged to:: Private residence Living Arrangements: Spouse/significant other Available Help at Discharge: Family;Available 24 hours/day;Personal care attendant Type of Home: House Home Access: Stairs to enter;Ramped entrance Entrance Stairs-Number of Steps: 6 Entrance Stairs-Rails: Right Home Layout: One level     Bathroom Shower/Tub: Walk-in shower;Sponge bathes at baseline   [employer]: [place] - single point;Rollator (4 wheels);Other (comment);Rolling Walker (2 wheels);Toilet riser   Additional Comments: Nurse from [place] comes 5 days/week to transport pt to HD 3x/week and then on days that pt does not get HD he comes from 9:00-4:30      Prior Functioning/Environment Prior Level of Function : Needs assist             Mobility Comments: Uses bil platform rollator in community, RW in home and intermittently furniture surfs. Fell ~6 months prior. ADLs Comments: Aide helps in household chores, transportation, bathing, grooming, and intermittently with dressing        OT Problem List: Decreased  strength;Impaired balance (sitting and/or standing);Decreased cognition;Decreased knowledge of use of DME or AE;Impaired vision/perception      OT Treatment/Interventions: Self-care/ADL training;DME and/or AE instruction;Therapeutic activities;Cognitive remediation/compensation;Visual/perceptual remediation/compensation;Patient/family education;Balance training    OT Goals(Current goals can be found in the care plan section) Acute Rehab OT Goals OT Goal Formulation: With patient Time For Goal Achievement: 08/18/22 Potential to Achieve Goals: Good ADL Goals Pt Will Perform Grooming: with set-up;sitting Pt Will Transfer to Toilet: with min assist;ambulating;bedside commode Additional ADL Goal #1: Pt will perform bed mobility with min assist in preparation for ADLs.  OT Frequency: Min 2X/week    Co-evaluation              AM-PAC OT "6 Clicks" Daily Activity     Outcome Measure Help from another person eating meals?: A Little Help from another person taking care of personal grooming?: A Little Help from another person toileting, which includes using toliet, bedpan, or urinal?: Total Help from another person bathing (including washing, rinsing, drying)?: A Lot Help from another person to put on and taking off regular upper body clothing?: A Lot Help from another person to put on and taking off regular lower body clothing?: Total 6 Click Score: 12   End of Session Equipment Utilized During Treatment: Rolling walker (2 wheels);Gait belt Nurse Communication: Mobility status  Activity Tolerance: Patient tolerated treatment well Patient left: in chair;with call bell/phone within reach;with chair alarm set  OT Visit Diagnosis: Unsteadiness on  feet (R26.81);Other abnormalities of gait and mobility (R26.89);Muscle weakness (generalized) (M62.81);Other symptoms and signs involving cognitive function                Time: 9030-1499 OT Time Calculation (min): 21 min Charges:  OT General  Charges $OT Visit: 1 Visit OT Evaluation $OT Eval Moderate Complexity: Loyal, OTR/L Acute Rehabilitation Services Office: [phone]   [name] 08/04/2022, 3:44 PM

## 2022-08-04 NOTE — Progress Notes (Signed)
Received patient in bed to unit.  Alert and oriented.  Informed consent signed and in chart.   Treatment completed: 1124  Patient tolerated well.  Transported back to the room  Alert, without acute distress.  Hand-off given to patient's nurse.   Access used: AVG Access issues: N/A  Total UF removed: 3L Post Weight:67.7KG    08/04/22 1124  Vitals  Temp 98.1 F (36.7 C)  BP 112/60  MAP (mmHg) 74  BP Location Left Arm  BP Method Automatic  Patient Position (if appropriate) Lying  Pulse Rate 62  Pulse Rate Source Monitor  ECG Heart Rate 62  Resp 19  Oxygen Therapy  SpO2 100 %  O2 Device Room Air  Patient Activity (if Appropriate) In bed  Pulse Oximetry Type Continuous  During Treatment Monitoring  Intra-Hemodialysis Comments Tx completed          [facility]

## 2022-08-04 NOTE — Progress Notes (Signed)
OT Cancellation Note  Patient Details Name: Travis [name] MRN: [id number] DOB: 1941/06/17   Cancelled Treatment:    Reason Eval/Treat Not Completed: Patient at procedure or test/ unavailable (Pt currently in HD.)  [name] 08/04/2022, 7:55 AM Cleta Alberts, OTR/L [place] Office: [phone]

## 2022-08-04 NOTE — Progress Notes (Signed)
PROGRESS NOTE    Travis [name]  TML:[id number] DOB: [birth year] DOA: 08/02/2022 PCP: [name], MD   Brief Narrative:  HPI: Travis [name] is a 81 y.o. 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   Patient is confused unable to answer any questions, all questions answered by patient brother at bedside.  [facility]  He usually is scheduled for [facility].  Last week, due to holiday schedule, he had his dialysis Friday and was scheduled to have next dialysis Sunday/yesterday.  Father reported that patient lives with his wife however due to acute health condition of the wife, patient has been by himself for last 3+ weeks with daughter drops by occasionally.  As a result, patient has been eating mostly fast food and there is a question of whether patient has been taking his medications regularly.  Friday after dialysis, patient complains of feeling tired and sleepy.  On Sunday, patient started to have diarrhea and fever of 45 F when he went to [place] to have scheduled dialysis, HD center cancelled the dialysis and sent him to ED however the patient misunderstood and went home instead.  Overnight, patient developed increasing shortness of breath and confusion more cough and shortness of breath.  And patient had large loose bowel movement this morning. Brother not sure whether patient had fever this morning, but did notice patient was very sleepy and called EMS.   ED Course: General lethargic, blood pressure significant elevated, tachypneic borderline hypoxia 94% on room air.  Chest x-ray showed pulmonary congestion.  Blood work showed K7.1, and patient received hyperkalemia cocktail including insulin and dextrose and Lokelma.  Fingerstick 37 improved to 97 after  D50. ED started patient on D10 drip and nephro was called for emergency HD.  Assessment & Plan:   Principal Problem:   CHF (congestive heart failure) (HCC) Active Problems:   Acute systolic CHF (congestive heart failure) (HCC)   Hypertensive encephalopathy   Hyperkalemia   Influenza A  Hyperkalemia: Secondary to missed dialysis, resolved.   Acute respiratory failure with hypoxia secondary to acute on chronic HFrEF/bilateral pleural effusions: Chest x-ray shows diffuse pulmonary edema and bilateral pleural effusions, right larger than the left.  Possibility of loculated effusion.  Overall x-ray is improved compared to yesterday.  I would hope that further hemodialysis will improve this effusion.  If not, we will consult PCCM and seek their opinion.   Patient received 1 dose of Lasix upon admission.  Echo was recently done so repeat is not needed.  Patient has not significantly clinically and is on room air and is comfortable without any complaints.  Aspiration pneumonia/community-acquired pneumonia: Patient does not meet sepsis criteria.  He only had tachypnea.  Leukocytosis also 10.6.  With elevated procalcitonin in the range of 8.10, there is suspicion of possible right lower lobe pneumonia.  Continue Zosyn and follow closely.  Influenza A pneumonia: Started on Tamiflu.   Acute metabolic encephalopathy -Probably secondary to HTN encephalopathy and hypoglycemia as well as worsening of uremia.  CT head unremarkable.  Patient fully alert and oriented today.   HTN emergency: Patient presented with acute respiratory failure, pulm edema and acute hypertensive encephalopathy.  Blood pressure was elevated up to 204/97 upon presentation, this was likely secondary to missed hemodialysis as well.  Blood pressure fairly stable today.  Continue current medications which includes amlodipine and Coreg.   Hypoglycemia: Blood sugar was 37 upon presentation, which improved to 97 after D50.  He was started on  D10 drip.  He is on very low-dose, we will try discontinuing that and follow blood sugar level.   Elevated LFTs: Slightly elevated LFTs however his AST ratio is much greater than ALT suggesting alcoholic hepatitis.  This could also be secondary to acute infection.  Follow closely.   CAD -with known 2 vessel disease on recent cath Currently asymptomatic.  Continue aspirin, Coreg.   Deconditioning -PT evaluation pending.  DVT prophylaxis: heparin injection 5,000 Units Start: 08/02/22 2300   Code Status: Full Code  Family Communication:  None present at bedside.  I called and updated his brother-in-law Josph Macho.  Status is: Inpatient Remains inpatient appropriate because: Patient still very sick   Estimated body mass index is 22.24 kg/m as calculated from the following:   Height as of this encounter: 5\' 10"  (1.778 m).   Weight as of this encounter: 70.3 kg.    Nutritional Assessment: Body mass index is 22.24 kg/m.Marland Kitchen Seen by dietician.  I agree with the assessment and plan as outlined below: Nutrition Status:        . Skin Assessment: I have examined the patient's skin and I agree with the wound assessment as performed by the wound care RN as outlined below:    Consultants:  None  Procedures:  None  Antimicrobials:  Anti-infectives (From admission, onward)    Start     Dose/Rate Route Frequency Ordered Stop   08/04/22 1800  oseltamivir (TAMIFLU) capsule 30 mg        30 mg Oral Every M-W-F 08/03/22 1455 08/09/22 1759   08/03/22 1600  oseltamivir (TAMIFLU) capsule 30 mg        30 mg Oral Once 08/03/22 1455 08/03/22 1707   08/03/22 1500  piperacillin-tazobactam (ZOSYN) IVPB 2.25 g        2.25 g 100 mL/hr over 30 Minutes Intravenous Every 8 hours 08/03/22 1432           Subjective:  Patient seen and examined in dialysis unit.  He was fully alert and oriented.  He had no complaints.  Objective: Vitals:   08/04/22 0930 08/04/22 1000 08/04/22 1030 08/04/22 1100   BP: (!) 107/59 (!) 119/58 (!) 129/56 (!) 130/54  Pulse: (!) 53 (!) 55 60 63  Resp: 14 20 (!) 23 20  Temp:      TempSrc:      SpO2: 99% 97% 99% 100%  Weight:      Height:        Intake/Output Summary (Last 24 hours) at 08/04/2022 1110 Last data filed at 08/03/2022 2222 Gross per 24 hour  Intake 180 ml  Output --  Net 180 ml    Filed Weights   08/02/22 2100 08/02/22 2158 08/04/22 0500  Weight: 68.9 kg 68.9 kg 70.3 kg    Examination:  General exam: Appears calm and comfortable  Respiratory system: Rhonchi bilaterally. Respiratory effort normal. Cardiovascular system: S1 & S2 heard, RRR. No JVD, murmurs, rubs, gallops or clicks. No pedal edema. Gastrointestinal system: Abdomen is nondistended, soft and nontender. No organomegaly or masses felt. Normal bowel sounds heard. Central nervous system: Alert and oriented. No focal neurological deficits. Extremities: Symmetric 5 x 5 power. Skin: No rashes, lesions or ulcers.   Data Reviewed: I have personally reviewed following labs and imaging studies  CBC: Recent Labs  Lab 08/02/22 1355 08/02/22  1413 08/03/22 0845 08/04/22 0814  WBC 7.1  --  10.6* 7.6  NEUTROABS 5.4  --   --   --   HGB 8.6* 10.2* 9.6* 7.9*  HCT 29.7* 30.0* 30.2* 24.6*  MCV 99.7  --  92.9 91.8  PLT 174  --  166 147*    Basic Metabolic Panel: Recent Labs  Lab 08/02/22 1355 08/02/22 1413 08/03/22 0710 08/04/22 0814  NA 137 133* 136 133*  K 7.1* 6.8* 4.2 4.5  CL 95* 100 89* 87*  CO2 23  --  31 28  GLUCOSE 94 97 91 85  BUN 53* 49* 28* 47*  CREATININE 12.41* 13.90* 8.52* 10.42*  CALCIUM 8.6*  --  8.8* 8.5*  PHOS  --   --   --  5.2*    GFR: Estimated Creatinine Clearance: 5.6 mL/min (A) (by C-G formula based on SCr of 10.42 mg/dL (H)). Liver Function Tests: Recent Labs  Lab 08/02/22 1355 08/03/22 0710 08/04/22 0814  AST 127* 249*  --   ALT 86* 161*  --   ALKPHOS 68 61  --   BILITOT 0.8 0.7  --   PROT 7.5 7.1  --   ALBUMIN 3.0* 2.8*  2.5*    No results for input(s): "LIPASE", "AMYLASE" in the last 168 hours. No results for input(s): "AMMONIA" in the last 168 hours. Coagulation Profile: No results for input(s): "INR", "PROTIME" in the last 168 hours. Cardiac Enzymes: No results for input(s): "CKTOTAL", "CKMB", "CKMBINDEX", "TROPONINI" in the last 168 hours. BNP (last 3 results) No results for input(s): "PROBNP" in the last 8760 hours. HbA1C: No results for input(s): "HGBA1C" in the last 72 hours. CBG: Recent Labs  Lab 08/02/22 1414 08/02/22 1510 08/02/22 1919 08/02/22 2209 08/03/22 0311  GLUCAP 35* 108* 68* 79 91    Lipid Profile: No results for input(s): "CHOL", "HDL", "LDLCALC", "TRIG", "CHOLHDL", "LDLDIRECT" in the last 72 hours. Thyroid Function Tests: No results for input(s): "TSH", "T4TOTAL", "FREET4", "T3FREE", "THYROIDAB" in the last 72 hours. Anemia Panel: No results for input(s): "VITAMINB12", "FOLATE", "FERRITIN", "TIBC", "IRON", "RETICCTPCT" in the last 72 hours. Sepsis Labs: Recent Labs  Lab 08/02/22 1355 08/03/22 0710 08/03/22 1317 08/04/22 0022  PROCALCITON  --  8.10  --  11.18  LATICACIDVEN 2.6*  --  2.6*  --      Recent Results (from the past 240 hour(s))  Resp Panel by RT-PCR (Flu A&B, Covid) Anterior Nasal Swab     Status: Abnormal   Collection Time: 08/02/22  2:51 PM   Specimen: Anterior Nasal Swab  Result Value Ref Range Status   SARS Coronavirus 2 by RT PCR NEGATIVE NEGATIVE Final    Comment: (NOTE) SARS-CoV-2 target nucleic acids are NOT DETECTED.  The SARS-CoV-2 RNA is generally detectable in upper respiratory specimens during the acute phase of infection. The lowest concentration of SARS-CoV-2 viral copies this assay can detect is 138 copies/mL. A negative result does not preclude SARS-Cov-2 infection and should not be used as the sole basis for treatment or other patient management decisions. A negative result may occur with  improper specimen collection/handling,  submission of specimen other than nasopharyngeal swab, presence of viral mutation(s) within the areas targeted by this assay, and inadequate number of viral copies(<138 copies/mL). A negative result must be combined with clinical observations, patient history, and epidemiological information. The expected result is Negative.  Fact Sheet for Patients:  EntrepreneurPulse.com.au  Fact Sheet for Healthcare Providers:  IncredibleEmployment.be  This test is no t yet approved  or cleared by the [place] and  has been authorized for detection and/or diagnosis of SARS-CoV-2 by FDA under an Emergency Use Authorization (EUA). This EUA will remain  in effect (meaning this test can be used) for the duration of the COVID-19 declaration under Section 564(b)(1) of the Act, 21 U.S.C.section 360bbb-3(b)(1), unless the authorization is terminated  or revoked sooner.       Influenza A by PCR POSITIVE (A) NEGATIVE Final   Influenza B by PCR NEGATIVE NEGATIVE Final    Comment: (NOTE) The Xpert Xpress SARS-CoV-2/FLU/RSV plus assay is intended as an aid in the diagnosis of influenza from Nasopharyngeal swab specimens and should not be used as a sole basis for treatment. Nasal washings and aspirates are unacceptable for Xpert Xpress SARS-CoV-2/FLU/RSV testing.  Fact Sheet for Patients: EntrepreneurPulse.com.au  Fact Sheet for Healthcare Providers: IncredibleEmployment.be  This test is not yet approved or cleared by the [place] FDA and has been authorized for detection and/or diagnosis of SARS-CoV-2 by FDA under an Emergency Use Authorization (EUA). This EUA will remain in effect (meaning this test can be used) for the duration of the COVID-19 declaration under Section 564(b)(1) of the Act, 21 U.S.C. section 360bbb-3(b)(1), unless the authorization is terminated or revoked.  Performed at [facility],  [place] [address] [place], [place] [address]   MRSA Next Gen by PCR, Nasal     Status: None   Collection Time: 08/02/22 10:02 PM   Specimen: Nasal Mucosa; Nasal Swab  Result Value Ref Range Status   MRSA by PCR Next Gen NOT DETECTED NOT DETECTED Final    Comment: (NOTE) The GeneXpert MRSA Assay (FDA approved for NASAL specimens only), is one component of a comprehensive MRSA colonization surveillance program. It is not intended to diagnose MRSA infection nor to guide or monitor treatment for MRSA infections. Test performance is not FDA approved in patients less than 40 years old. Performed at [facility], [place] [address], [place], [place] [address]      Radiology Studies: DG Chest [place] 1 View  Result Date: 08/03/2022 CLINICAL DATA:  81 year old male with altered mental status and shortness of breath. Dialysis patient, recently missed dialysis. EXAM: PORTABLE CHEST 1 VIEW COMPARISON:  Portable chest 08/02/2022 and earlier. FINDINGS: Portable AP semi upright view at 0620 hours. Stable large lung volumes, cardiomegaly, mediastinal contours. Stable right medial subclavian region vascular stent. Patchy and asymmetric scattered bilateral pulmonary opacity has mildly improved since yesterday. Geographic increased density in the right lower chest may be related to small volume loculated pleural fluid as suspected previously, stable. No new confluent opacity. Stable visualized osseous structures. IMPRESSION: 1. Mildly improved bilateral ventilation since yesterday, favor regression of pulmonary edema. 2. Geographic opacity projecting over the right lower lung is stable, possibly fluid loculated in a right pleural fissure. 3. No other pleural effusion or new cardiopulmonary abnormality identified. Electronically Signed   By: Genevie Ann M.D.   On: 08/03/2022 06:48   CT HEAD WO CONTRAST (5MM)  Result Date: 08/02/2022 CLINICAL DATA:  Mental status change, unknown cause. EXAM: CT HEAD WITHOUT CONTRAST  TECHNIQUE: Contiguous axial images were obtained from the base of the skull through the vertex without intravenous contrast. RADIATION DOSE REDUCTION: This exam was performed according to the departmental dose-optimization program which includes automated exposure control, adjustment of the mA and/or kV according to patient size and/or use of iterative reconstruction technique. COMPARISON:  07/04/2022. FINDINGS: Brain: No acute intracranial hemorrhage, midline shift or mass  effect. No extra-axial fluid collection. Mild atrophy is noted. Periventricular white matter hypodensities are present bilaterally. No hydrocephalus. Vascular: Carotid artery calcifications.  No hyperdense vessel. Skull: Normal. Negative for fracture or focal lesion. Sinuses/Orbits: Mild mucosal thickening in the ethmoid air cells, right maxillary sinus, and left sphenoid sinus. No acute orbital abnormality. Other: None. IMPRESSION: 1. No acute intracranial process. 2. Atrophy with chronic microvascular ischemic changes. Electronically Signed   By: Brett Fairy M.D.   On: 08/02/2022 23:51   DG Chest Portable 1 View  Result Date: 08/02/2022 CLINICAL DATA:  Altered mental status, cough, missed dialysis Saturday, drowsy EXAM: PORTABLE CHEST 1 VIEW COMPARISON:  Portable exam 1430 hours compared to 07/04/2022 FINDINGS: Stent identified at RIGHT brachiocephalic vein. Enlargement of cardiac silhouette with pulmonary vascular congestion. Scratch at loculated effusions at the apices. Patchy BILATERAL pulmonary infiltrates favoring pulmonary edema. Small basilar effusion inferior RIGHT hemithorax, decreased. More focal area of opacity in the mid to lower RIGHT lung is seen, likely represents loculated effusion at RIGHT major fissure, unchanged. Osseous demineralization. IMPRESSION: Enlargement of cardiac silhouette with pulmonary vascular congestion and probable diffuse pulmonary edema. BILATERAL pleural effusions larger on RIGHT with an area of  focal opacity in the mid to lower RIGHT lung, favor loculated effusion as seen on the prior exam. Electronically Signed   By: Lavonia Dana M.D.   On: 08/02/2022 14:45    Scheduled Meds:  amLODipine  10 mg Oral Daily   aspirin EC  81 mg Oral Daily   atropine  1 drop Both Eyes TID   brinzolamide  1 drop Both Eyes TID   And   brimonidine  1 drop Both Eyes TID   calcitRIOL  2.75 mcg Oral Q M,W,F-HD   calcium gluconate  1 g Intravenous Once   carvedilol  3.125 mg Oral BID WC   Chlorhexidine Gluconate Cloth  6 each Topical Q0600   cinacalcet  90 mg Oral Q M,W,F   insulin aspart  10 Units Intravenous Once   And   dextrose  1 ampule Intravenous Once   heparin  5,000 Units Subcutaneous Q12H   latanoprost  1 drop Both Eyes QHS   ofloxacin  1 drop Both Eyes QID   oseltamivir  30 mg Oral Q M,W,F   pantoprazole  40 mg Oral Daily   prednisoLONE acetate  1 drop Both Eyes Q4H   sevelamer carbonate  1,600 mg Oral TID WC   timolol  1 drop Both Eyes Daily   Continuous Infusions:  dextrose 30 mL/hr at 08/02/22 2356   furosemide     piperacillin-tazobactam (ZOSYN)  IV 2.25 g (08/04/22 0647)     LOS: 2 days   Darliss Cheney, MD [facility]  08/04/2022, 11:10 AM   *Please note that this is a verbal dictation therefore any spelling or grammatical errors are due to the "[place] [facility]" system interpretation.  Please page via [place] and do not message via secure chat for urgent patient care matters. Secure chat can be used for non urgent patient care matters.  How to contact the [facility] Attending or Consulting provider [place] or covering provider during after hours [place], for this patient?  Check the care team in [facility] and look for a) attending/consulting TR[facility] provider listed and b) the [facility] team listed. Page or secure chat [phone]. Log into [email or web address] and use [place]'s universal password to access. If you do not have the password, please contact the hospital operator. Locate the [facility]  provider you are looking for under [facility] and page to a number that you can be directly reached. If you still have difficulty reaching the provider, please page the [facility] (Director on Call) for the Hospitalists listed on amion for assistance.

## 2022-08-04 NOTE — Plan of Care (Signed)
  Problem: Cardiovascular: Goal: Ability to achieve and maintain adequate cardiovascular perfusion will improve Outcome: Progressing   Problem: Cardiac: Goal: Ability to achieve and maintain adequate cardiopulmonary perfusion will improve Outcome: Progressing   Problem: Education: Goal: Knowledge of General Education information will improve Description: Including pain rating scale, medication(s)/side effects and non-pharmacologic comfort measures Outcome: Progressing   Problem: Clinical Measurements: Goal: Ability to maintain clinical measurements within normal limits will improve Outcome: Progressing Goal: Will remain free from infection Outcome: Progressing

## 2022-08-04 NOTE — Procedures (Signed)
I was present at this dialysis session. I have reviewed the session itself and made appropriate changes.   K 4.2 on 3K bath.  UF goal 3L.  Using AVG.  Hb and labs stable.  Pt feels better.  Filed Weights   08/02/22 2100 08/02/22 2158 08/04/22 0500  Weight: 68.9 kg 68.9 kg 70.3 kg    Recent Labs  Lab 08/03/22 0710  NA 136  K 4.2  CL 89*  CO2 31  GLUCOSE 91  BUN 28*  CREATININE 8.52*  CALCIUM 8.8*    Recent Labs  Lab 08/02/22 1355 08/02/22 1413 08/03/22 0845  WBC 7.1  --  10.6*  NEUTROABS 5.4  --   --   HGB 8.6* 10.2* 9.6*  HCT 29.7* 30.0* 30.2*  MCV 99.7  --  92.9  PLT 174  --  166    Scheduled Meds:  amLODipine  10 mg Oral Daily   aspirin EC  81 mg Oral Daily   atropine  1 drop Both Eyes TID   brinzolamide  1 drop Both Eyes TID   And   brimonidine  1 drop Both Eyes TID   calcitRIOL  2.75 mcg Oral Q M,W,F-HD   calcium gluconate  1 g Intravenous Once   carvedilol  3.125 mg Oral BID WC   Chlorhexidine Gluconate Cloth  6 each Topical Q0600   cinacalcet  90 mg Oral Q M,W,F   insulin aspart  10 Units Intravenous Once   And   dextrose  1 ampule Intravenous Once   heparin  5,000 Units Subcutaneous Q12H   latanoprost  1 drop Both Eyes QHS   ofloxacin  1 drop Both Eyes QID   oseltamivir  30 mg Oral Q M,W,F   pantoprazole  40 mg Oral Daily   prednisoLONE acetate  1 drop Both Eyes Q4H   sevelamer carbonate  1,600 mg Oral TID WC   timolol  1 drop Both Eyes Daily   Continuous Infusions:  dextrose 30 mL/hr at 08/02/22 2356   furosemide     piperacillin-tazobactam (ZOSYN)  IV 2.25 g (08/04/22 0647)   PRN Meds:.hydrALAZINE, lidocaine (PF), lidocaine-prilocaine, pentafluoroprop-tetrafluoroeth   Pearson Grippe  MD 08/04/2022, 8:57 AM

## 2022-08-04 NOTE — TOC Initial Note (Signed)
Transition of Care [facility]) - Initial/Assessment Note    Patient Details  Name: Travis [name] MRN: [id number] Date of Birth: 04-20-1941  [facility]) CM/SW Contact:    Bethena Roys, RN Phone Number: 08/04/2022, 4:48 PM  Clinical Narrative:  Case Manager reviewed the case and per CSW note patient wants home health. Case Manager spoke with patient- was referred to daughter Travis [name]. Patient is from home with spouse and has support of daughter Travis [name]. Case Manager spoke with spouse and daughter Travis [name] regarding disposition needs. Family wants the patient to return home with home health services. Daughter Travis [name] asked specifically for Travis [name]- Ref[facility] submitted [facility]OT; [facility]. If patient is discharged over the Holiday- family is aware that [facility] will not start until Wed or Thursday of next week and they are fine with that. Daughter states patient has DME Rolling walker in the home. Patient has an aide seven days a week from  9 am-4 pm and they transport the patient to HD. Patient is a member of the [facility]; PCP is Dr. Netta [name] and Hospers is Travis [name] [phone] ext [phone]. Spouse states patient gets all medications from the [facility]. No further needs identified at this time.                                   Expected Discharge Plan: [place] Barriers to Discharge: Continued Medical Work up   Patient Goals and CMS Choice   CMS Medicare.gov Compare Post Acute Care list provided to::  (daughter wants [facility].)    Expected Discharge Plan and Services Expected Discharge Plan: [place] In-house Referral: NA Discharge Planning Services: CM Consult Post Acute Care Choice: [place] arrangements for the past 2 months: Single Family Home                   DME Agency: NA       HH Arranged: PT, OT HH Agency: [place] Date [place]: 08/04/22 Time [place]: 1648 Representative spoke with at [place]: Claiborne Billings  Prior Living Arrangements/Services Living arrangements for the past 2 months: [place] with:: Spouse, Adult Children Patient language and need for interpreter reviewed:: Yes Do you feel safe going back to the place where you live?: Yes      Need for Family Participation in Patient Care: Yes (Comment) Care giver support system in place?: Yes (comment) Current home services: DME (rolling walker in the home.) Criminal Activity/Legal Involvement Pertinent to Current Situation/Hospitalization: No - Comment as needed  Activities of Daily Living      Permission Sought/Granted Permission sought to share information with : Family Supports, Customer service manager, Case Manager Permission granted to share information with : Yes, Verbal Permission Granted  Share Information with NAME: Travis [name] (Spouse)  [phone] (Mobile)  Permission granted to share info w AGENCY: [facility]        Emotional Assessment Appearance:: Appears stated age Attitude/Demeanor/Rapport: Lethargic, Engaged Affect (typically observed): Accepting, Appropriate Orientation: : Oriented to Self, Oriented to Place, Oriented to  Time, Oriented to Situation Alcohol / Substance Use: Not Applicable Psych Involvement: No (comment)  Admission diagnosis:  Hyperkalemia [E87.5] CHF (congestive heart failure) (HCC) [I50.9] Acute pulmonary edema (HCC) [J81.0] Hypoglycemia [E16.2] Chronic anemia [D64.9] ESRD on hemodialysis ([place]) [N18.6, Z99.2] Patient Active Problem List  Diagnosis Date Noted   Influenza A 08/03/2022   Hyperkalemia 08/02/2022   CHF (congestive heart failure) ([place]) 08/02/2022   Coronary artery disease 07/06/2022   Elevated troponin 07/05/2022   Prolonged QT interval 07/05/2022   GERD (gastroesophageal reflux disease) 07/05/2022   ACS (acute coronary syndrome) [facility])     NSTEMI (non-ST elevated myocardial infarction) ([place]) 07/04/2022   Delirium 12/22/2021   Hypertensive encephalopathy 12/20/2021   End stage renal disease on dialysis [facility])    Hypertensive emergency 12/18/2021   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   Chronic systolic CHF (congestive heart failure), NYHA class 2 ([place]) 11/05/2015   Chest pain    Thrombocytopenia ([place]) 08/11/2015   FTT (failure to thrive) in adult 09/05/8249   Acute systolic CHF (congestive heart failure) ([place]) 04/25/2015   HTN (hypertension) 04/19/2015   Protein-calorie malnutrition, severe ([place]) 04/19/2015   Acute on chronic diastolic CHF (congestive heart failure) ([place]) 04/18/2015   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   ESRD on dialysis [facility])    Palliative care encounter    Acute DVT of right tibial vein ([place]) 01/29/2015   Physical deconditioning 01/28/2015   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 [place] [address] Phone: [phone] Fax: [phone]   Readmission Risk Interventions    07/05/2022   12:00 PM 12/22/2021    5:33 PM 05/26/2021   12:03 PM  Readmission Risk Prevention Plan  Transportation Screening Complete Complete Complete  PCP or Specialist Appt within 3-5 Days  Complete Complete  HRI or Home Care Consult Complete Complete Complete  Social Work Consult for [place] Planning/Counseling Complete Complete Complete  Palliative Care Screening Not Applicable Not Applicable Not Applicable   Medication Review (RN Care Manager) Referral to Pharmacy Complete Complete

## 2022-08-05 DIAGNOSIS — I5023 Acute on chronic systolic (congestive) heart failure: Secondary | ICD-10-CM | POA: Diagnosis not present

## 2022-08-05 MED ORDER — OSELTAMIVIR PHOSPHATE 30 MG PO CAPS: 30.0000 mg | ORAL_CAPSULE | ORAL | 0 refills | Status: AC

## 2022-08-05 MED ORDER — AMLODIPINE BESYLATE 10 MG PO TABS: 10.0000 mg | ORAL_TABLET | Freq: Every day | ORAL | 0 refills | Status: DC

## 2022-08-05 MED ORDER — AMOXICILLIN-POT CLAVULANATE 500-125 MG PO TABS: 1.0000 | ORAL_TABLET | Freq: Two times a day (BID) | ORAL | 0 refills | Status: AC

## 2022-08-05 NOTE — Discharge Summary (Signed)
Physician Discharge Summary  Travis [name] MVE:[id number] DOB: 06/01/1941 DOA: 08/02/2022  PCP: [name], MD  Admit date: 08/02/2022 Discharge date: 08/05/2022 30 Day Unplanned Readmission Risk Score    Flowsheet Row ED to Hosp-Admission (Current) from 08/02/2022 in [facility]  30 Day Unplanned Readmission Risk Score (%) 35.37 Filed at 08/05/2022 0801       This score is the patient's risk of an unplanned readmission within 30 days of being discharged (0 -100%). The score is based on dignosis, age, lab data, medications, orders, and past utilization.   Low:  0-14.9   Medium: 15-21.9   High: 22-29.9   Extreme: 30 and above          Admitted From: Home Disposition: Home  Recommendations for Outpatient Follow-up:  Follow up with PCP in 1-2 weeks Please obtain BMP/CBC in one week Please follow up with your PCP on the following pending results: Unresulted Labs (From admission, onward)     Start     Ordered   08/02/22 1332  Urinalysis, Routine w reflex microscopic  (ED ALOC)  Once,   URGENT        08/02/22 [place]: Yes Equipment/Devices: None  Discharge Condition: Stable CODE STATUS: Full code Diet recommendation: Renal/cardiac  Subjective: Patient seen and examined.  He feels well.  He has no complaints.  He is fully alert and oriented and he desires to go home.  Follow HPI and ED course by admitting hospitalist. HPI: Travis [name] is a 81 y.o. 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   Patient is confused unable to answer any questions, all questions answered by patient brother at bedside.  [facility]  He usually is scheduled for [facility]TS.  Last week, due to holiday  schedule, he had his dialysis Friday and was scheduled to have next dialysis Sunday/yesterday.  Father reported that patient lives with his wife however due to acute health condition of the wife, patient has been by himself for last 3+ weeks with daughter drops by occasionally.  As a result, patient has been eating mostly fast food and there is a question of whether patient has been taking his medications regularly.  Friday after dialysis, patient complains of feeling tired and sleepy.  On Sunday, patient started to have diarrhea and fever of 53 F when he went to [place] to have scheduled dialysis, HD center cancelled the dialysis and sent him to ED however the patient misunderstood and went home instead.  Overnight, patient developed increasing shortness of breath and confusion more cough and shortness of breath.  And patient had large loose bowel movement this morning. Brother not sure whether patient had fever this morning, but did notice patient was very sleepy and called EMS.   ED Course: General lethargic, blood pressure significant elevated, tachypneic borderline hypoxia 94% on room air.  Chest x-ray showed pulmonary congestion.  Blood work showed K7.1, and patient received hyperkalemia cocktail including insulin and dextrose and Lokelma.  Fingerstick 37 improved to 97 after D50. ED started patient on D10 drip and nephro was called for emergency HD.  Brief/Interim Summary: Patient was admitted with multiple diagnoses, details below.  Hyperkalemia: Secondary to missed dialysis, resolved.  Acute respiratory failure with hypoxia secondary to acute on chronic HFrEF/bilateral pleural effusions: Chest x-ray shows diffuse pulmonary edema and bilateral pleural effusions, right larger than the left.  Possibility of loculated effusion which is likely edema as well.  Overall x-ray is improved and patient has improved significantly after receiving 2 sessions of dialysis as well.  Patient received 1 dose of Lasix  upon admission.  Echo was recently done so repeat is not needed.  Patient is on room air since last 2 days.  He is feeling well so he is going to be discharged today in stable condition.   Aspiration pneumonia/community-acquired pneumonia: Patient does not meet sepsis criteria.  He only had tachypnea.  Leukocytosis also 10.6.  With elevated procalcitonin in the range of 8.10, there is suspicion of possible right lower lobe pneumonia.  He was given Zosyn throughout the hospitalization, I am discharging him on 5 more days of oral Augmentin/renally adjusted dose.   Influenza A pneumonia: Started on Tamiflu which we will continue for 2 more doses.   Acute metabolic encephalopathy -Probably secondary to HTN encephalopathy and hypoglycemia as well as worsening of uremia.  CT head unremarkable.  Patient fully alert and oriented today.   HTN emergency: Patient presented with acute respiratory failure, pulm edema and acute hypertensive encephalopathy.  Blood pressure was elevated up to 204/97 upon presentation, this was likely secondary to missed hemodialysis as well.  Blood pressure fairly stable today.  Continue current medications which includes amlodipine and Coreg.   Hypoglycemia: Blood sugar was 37 upon presentation, which improved to 97 after D50.  He was started on D10 drip but that was discontinued yesterday and his blood sugar has been stable.   Elevated LFTs: Slightly elevated LFTs however his AST ratio is much greater than ALT suggesting alcoholic hepatitis.  This could also be secondary to acute infection.    CAD -with known 2 vessel disease on recent cath Currently asymptomatic.  Continue aspirin, Coreg.   Deconditioning: Patient was evaluated by PT OT who recommended SNF however patient's family including wife and daughter wants to take him home with home health.  Per TOC note from 08/04/2022,  "Daughter [name] asked specifically for [place]- Referral submitted for PT/OT;  [facility]. If patient is discharged over the Holiday- family is aware that [facility] will not start until Wed or Thursday of next week and they are fine with that. Daughter states patient has DME Rolling walker in the home. Patient has an aide seven days a week from  9 am-4 pm and they transport the patient to HD. Patient is a member of the [facility]; PCP is Dr. Netta Neat and [place] is [place] [phone] ext [phone]. Spouse states patient gets all medications from the [facility]. No further needs identified at this time" I personally spoke to the wife over the phone and she also verified that they are ready to take him home today.  Discharge plan was discussed with patient and/or family member and they verbalized understanding and agreed with it.  Discharge Diagnoses:  Principal Problem:   CHF (congestive heart failure) (HCC) Active Problems:   Acute systolic CHF (congestive heart failure) (HCC)   Hypertensive encephalopathy   Hyperkalemia   Influenza A    Discharge Instructions   Allergies as of 08/05/2022   No Active Allergies      Medication List     STOP taking these medications    losartan 25 MG  tablet Commonly known as: COZAAR       TAKE these medications    Accu-Chek Guide test strip Generic drug: glucose blood Use to test blood glucose four times daily as directed   Accu-Chek Guide w/Device Kit Use up to four times daily as directed.   Accu-Chek Softclix Lancets lancets Use to test blood glucose four times daily as directed.   acetaminophen 500 MG tablet Commonly known as: TYLENOL Take 500 mg by mouth every 6 (six) hours as needed for moderate pain.   amLODipine 10 MG tablet Commonly known as: NORVASC Take 1 tablet (10 mg total) by mouth daily.   amoxicillin-clavulanate 500-125 MG tablet Commonly known as: Augmentin Take 1 tablet by mouth 2 (two) times daily for 5 days.   aspirin EC 81 MG  tablet Take 1 tablet (81 mg total) by mouth daily.   atorvastatin 40 MG tablet Commonly known as: LIPITOR Take 1 tablet (40 mg total) by mouth daily at 6 PM.   atropine 1 % ophthalmic solution Place 1 drop into the right eye in the morning.   Brinzolamide-Brimonidine 1-0.2 % Susp Place 1 drop into both eyes in the morning, at noon, and at bedtime.   calcitRIOL 0.25 MCG capsule Commonly known as: ROCALTROL Take 13 capsules (3.25 mcg total) by mouth every Monday, Wednesday, and Friday with hemodialysis.   carvedilol 12.5 MG tablet Commonly known as: COREG Take 1 tablet (12.5 mg total) by mouth 2 (two) times daily with a meal.   cinacalcet 30 MG tablet Commonly known as: SENSIPAR Take 3 tablets (90 mg total) by mouth every Monday, Wednesday, and Friday with hemodialysis.   ferric gluconate 62.5 mg in sodium chloride 0.9 % 100 mL Inject 62.5 mg into the vein every Wednesday with hemodialysis.   gabapentin 300 MG capsule Commonly known as: NEURONTIN Take 300 mg by mouth daily.   latanoprost 0.005 % ophthalmic solution Commonly known as: XALATAN Place 1 drop into both eyes at bedtime.   methocarbamol 500 MG tablet Commonly known as: ROBAXIN Take 500 mg by mouth 3 (three) times daily as needed for muscle spasms.   midodrine 10 MG tablet Commonly known as: PROAMATINE Take 10 mg by mouth every Monday, Wednesday, and Friday. Take 10 mg (1 tablet) by mouth before dialysis and 10 mg (1 tablet) at dialysis as directed.   nitroGLYCERIN 0.4 MG SL tablet Commonly known as: NITROSTAT Place 1 tablet (0.4 mg total) under the tongue every 5 (five) minutes as needed for chest pain (for total of 3 doses at the most).   omeprazole 40 MG capsule Commonly known as: PRILOSEC Take 40 mg by mouth daily.   oseltamivir 30 MG capsule Commonly known as: TAMIFLU Take 1 capsule (30 mg total) by mouth every Monday, Wednesday, and Friday for 2 doses. Start taking on: August 06, 2022    prednisoLONE acetate 1 % ophthalmic suspension Commonly known as: PRED FORTE Place 1 drop into the right eye 6 (six) times daily.   sevelamer carbonate 800 MG tablet Commonly known as: RENVELA Take 2 tablets (1,600 mg total) by mouth 3 (three) times daily with meals. What changed: how much to take   timolol 0.5 % ophthalmic gel-forming Commonly known as: TIMOPTIC-XR Place 1 drop into both eyes daily.        Follow-up Information     Koirala, Dibas, MD Follow up in 1 week(s).   Specialty: Family Medicine Contact information: [address] [place] [address] [phone]  No Active Allergies  Consultations: Nephrology   Procedures/Studies: DG Chest Port 1 View  Result Date: 08/03/2022 CLINICAL DATA:  81 year old male with altered mental status and shortness of breath. Dialysis patient, recently missed dialysis. EXAM: PORTABLE CHEST 1 VIEW COMPARISON:  Portable chest 08/02/2022 and earlier. FINDINGS: Portable AP semi upright view at 0620 hours. Stable large lung volumes, cardiomegaly, mediastinal contours. Stable right medial subclavian region vascular stent. Patchy and asymmetric scattered bilateral pulmonary opacity has mildly improved since yesterday. Geographic increased density in the right lower chest may be related to small volume loculated pleural fluid as suspected previously, stable. No new confluent opacity. Stable visualized osseous structures. IMPRESSION: 1. Mildly improved bilateral ventilation since yesterday, favor regression of pulmonary edema. 2. Geographic opacity projecting over the right lower lung is stable, possibly fluid loculated in a right pleural fissure. 3. No other pleural effusion or new cardiopulmonary abnormality identified. Electronically Signed   By: Genevie Ann M.D.   On: 08/03/2022 06:48   CT HEAD WO CONTRAST (5MM)  Result Date: 08/02/2022 CLINICAL DATA:  Mental status change, unknown cause. EXAM: CT  HEAD WITHOUT CONTRAST TECHNIQUE: Contiguous axial images were obtained from the base of the skull through the vertex without intravenous contrast. RADIATION DOSE REDUCTION: This exam was performed according to the departmental dose-optimization program which includes automated exposure control, adjustment of the mA and/or kV according to patient size and/or use of iterative reconstruction technique. COMPARISON:  07/04/2022. FINDINGS: Brain: No acute intracranial hemorrhage, midline shift or mass effect. No extra-axial fluid collection. Mild atrophy is noted. Periventricular white matter hypodensities are present bilaterally. No hydrocephalus. Vascular: Carotid artery calcifications.  No hyperdense vessel. Skull: Normal. Negative for fracture or focal lesion. Sinuses/Orbits: Mild mucosal thickening in the ethmoid air cells, right maxillary sinus, and left sphenoid sinus. No acute orbital abnormality. Other: None. IMPRESSION: 1. No acute intracranial process. 2. Atrophy with chronic microvascular ischemic changes. Electronically Signed   By: Brett Fairy M.D.   On: 08/02/2022 23:51   DG Chest Portable 1 View  Result Date: 08/02/2022 CLINICAL DATA:  Altered mental status, cough, missed dialysis Saturday, drowsy EXAM: PORTABLE CHEST 1 VIEW COMPARISON:  Portable exam 1430 hours compared to 07/04/2022 FINDINGS: Stent identified at RIGHT brachiocephalic vein. Enlargement of cardiac silhouette with pulmonary vascular congestion. Scratch at loculated effusions at the apices. Patchy BILATERAL pulmonary infiltrates favoring pulmonary edema. Small basilar effusion inferior RIGHT hemithorax, decreased. More focal area of opacity in the mid to lower RIGHT lung is seen, likely represents loculated effusion at RIGHT major fissure, unchanged. Osseous demineralization. IMPRESSION: Enlargement of cardiac silhouette with pulmonary vascular congestion and probable diffuse pulmonary edema. BILATERAL pleural effusions larger on  RIGHT with an area of focal opacity in the mid to lower RIGHT lung, favor loculated effusion as seen on the prior exam. Electronically Signed   By: Lavonia Dana M.D.   On: 08/02/2022 14:45   ECHOCARDIOGRAM COMPLETE  Result Date: 07/06/2022    ECHOCARDIOGRAM REPORT   Patient Name:   SHUN PLETZ Date of Exam: 07/06/2022 Medical Rec #:  [id number]       Height:       70.0 in Accession #:    [id number]      Weight:       154.3 lb Date of Birth:  May 08, 1941       BSA:          1.870 m Patient Age:    11 years        BP:  147/76 mmHg Patient Gender: M               HR:           66 bpm. Exam Location:  Inpatient Procedure: 2D Echo, 3D Echo, Cardiac Doppler and Color Doppler Indications:     NSTEMI I21.4  History:         Patient has prior history of Echocardiogram examinations, most                  recent 11/11/2020. CHF, NSTEMI, Signs/Symptoms:Chest Pain; Risk                  Factors:Hypertension and Non-Smoker.  Sonographer:     Greer Pickerel Referring Phys:  [id number] Lequita Halt Diagnosing Phys: Eleonore Chiquito MD  Sonographer Comments: Suboptimal subcostal window. Image acquisition challenging due to respiratory motion. IMPRESSIONS  1. Left ventricular ejection fraction, by estimation, is 45 to 50%. Left ventricular ejection fraction by 2D MOD biplane is 47.4 %. The left ventricle has mildly decreased function. The left ventricle has no regional wall motion abnormalities. There is moderate asymmetric left ventricular hypertrophy of the infero-lateral segment. Left ventricular diastolic parameters are consistent with Grade II diastolic dysfunction (pseudonormalization).  2. Right ventricular systolic function is normal. The right ventricular size is normal. Tricuspid regurgitation signal is inadequate for assessing PA pressure.  3. The mitral valve is grossly normal. Mild mitral valve regurgitation. No evidence of mitral stenosis.  4. The aortic valve is tricuspid. There is moderate calcification of the  aortic valve. There is moderate thickening of the aortic valve. Aortic valve regurgitation is mild. Mild aortic valve stenosis.  5. The inferior vena cava is normal in size with greater than 50% respiratory variability, suggesting right atrial pressure of 3 mmHg. Comparison(s): Changes from prior study are noted. The left ventricular function is worsened. FINDINGS  Left Ventricle: Left ventricular ejection fraction, by estimation, is 45 to 50%. Left ventricular ejection fraction by 2D MOD biplane is 47.4 %. The left ventricle has mildly decreased function. The left ventricle has no regional wall motion abnormalities. 3D left ventricular ejection fraction analysis performed but not reported based on interpreter judgement due to suboptimal tracking. The left ventricular internal cavity size was normal in size. There is moderate asymmetric left ventricular hypertrophy of the infero-lateral segment. Left ventricular diastolic parameters are consistent with Grade II diastolic dysfunction (pseudonormalization). Right Ventricle: The right ventricular size is normal. No increase in right ventricular wall thickness. Right ventricular systolic function is normal. Tricuspid regurgitation signal is inadequate for assessing PA pressure. Left Atrium: Left atrial size was normal in size. Right Atrium: Right atrial size was normal in size. Pericardium: There is no evidence of pericardial effusion. Mitral Valve: The mitral valve is grossly normal. Mild mitral valve regurgitation. No evidence of mitral valve stenosis. Tricuspid Valve: The tricuspid valve is grossly normal. Tricuspid valve regurgitation is mild . No evidence of tricuspid stenosis. Aortic Valve: The aortic valve is tricuspid. There is moderate calcification of the aortic valve. There is moderate thickening of the aortic valve. Aortic valve regurgitation is mild. Aortic regurgitation PHT measures 460 msec. Mild aortic stenosis is present. Aortic valve mean gradient  measures 15.0 mmHg. Aortic valve peak gradient measures 27.2 mmHg. Aortic valve area, by VTI measures 1.11 cm. Pulmonic Valve: The pulmonic valve was grossly normal. Pulmonic valve regurgitation is trivial. No evidence of pulmonic stenosis. Aorta: The aortic root and ascending aorta are structurally normal, with no evidence of dilitation.  Venous: The inferior vena cava is normal in size with greater than 50% respiratory variability, suggesting right atrial pressure of 3 mmHg. IAS/Shunts: The atrial septum is grossly normal.  LEFT VENTRICLE PLAX 2D                        Biplane EF (MOD) LVIDd:         5.10 cm         LV Biplane EF:   Left LVIDs:         3.90 cm                          ventricular LV PW:         1.80 cm                          ejection LV IVS:        0.60 cm                          fraction by LVOT diam:     1.80 cm                          2D MOD LV SV:         61                               biplane is LV SV Index:   33                               47.4 %. LVOT Area:     2.54 cm                                Diastology                                LV e' medial:    4.57 cm/s LV Volumes (MOD)               LV E/e' medial:  26.9 LV vol d, MOD    121.0 ml      LV e' lateral:   5.98 cm/s A2C:                           LV E/e' lateral: 20.6 LV vol d, MOD    99.0 ml A4C: LV vol s, MOD    64.6 ml A2C: LV vol s, MOD    53.5 ml       3D Volume EF: A4C:                           3D EF:        53 % LV SV MOD A2C:   56.4 ml       LV EDV:       207 ml LV SV MOD A4C:   99.0 ml       LV ESV:       97 ml LV SV MOD BP:    52.7 ml  LV SV:        110 ml RIGHT VENTRICLE RV S prime:     8.92 cm/s TAPSE (M-mode): 2.0 cm LEFT ATRIUM             Index        RIGHT ATRIUM           Index LA diam:        3.60 cm 1.93 cm/m   RA Area:     17.50 cm LA Vol (A2C):   87.0 ml 46.54 ml/m  RA Volume:   46.90 ml  25.09 ml/m LA Vol (A4C):   32.3 ml 17.28 ml/m LA Biplane Vol: 52.7 ml 28.19 ml/m  AORTIC VALVE                      PULMONIC VALVE AV Area (Vmax):    1.13 cm      PR End Diast Vel: 5.02 msec AV Area (Vmean):   1.16 cm AV Area (VTI):     1.11 cm AV Vmax:           261.00 cm/s AV Vmean:          182.500 cm/s AV VTI:            0.546 m AV Peak Grad:      27.2 mmHg AV Mean Grad:      15.0 mmHg LVOT Vmax:         116.00 cm/s LVOT Vmean:        83.300 cm/s LVOT VTI:          0.239 m LVOT/AV VTI ratio: 0.44 AI PHT:            460 msec  AORTA Ao Root diam: 3.90 cm Ao Asc diam:  3.70 cm MITRAL VALVE MV Area (PHT): 3.60 cm     SHUNTS MV Decel Time: 211 msec     Systemic VTI:  0.24 m MV E velocity: 123.00 cm/s  Systemic Diam: 1.80 cm MV A velocity: 75.00 cm/s MV E/A ratio:  1.64 Eleonore Chiquito MD Electronically signed by Eleonore Chiquito MD Signature Date/Time: 07/06/2022/6:51:56 PM    Final (Updated)    CARDIAC CATHETERIZATION  Addendum Date: 07/06/2022     Heavily calcified diffusely diseased vessel: 1st Diag-1 lesion is 99% stenosed. 1st Diag-2 lesion is 60% stenosed. 1st Diag-3 lesion is 95% stenosed.   The left ventricular systolic function is normal. The left ventricular ejection fraction is 50-55% by visual estimate.   LV end diastolic pressure is moderately elevated.   Hemodynamic findings consistent with mild pulmonary hypertension. POSTOP DIAGNOSES Moderate-severe 2-vessel single-vessel disease: Proximal D1-99%, mid 60%, distal 95% (heavily calcified/diffusely diseased; not favorable for PCI would require atherectomy and a relatively small caliber vessel) & 60% ostial sidebranch of OM1 followed by a 60% mid vessel then distal occlusion (with right to left collaterals). Otherwise mild diffuse disease/calcified vessels Mostly preserved LVEF, cannot exclude anterolateral hypokinesis.  LVEDP moderately elevated at 19 mmHg (LVEDP 166/5 mmHg) with a PCWP of ~22 mmHg. RHC numbers: Mean RAP 6 mmHg, RVP-EDP 51/0-5 mmHg; PAP-mean 50/13-27 mmHg; PCWP 17-26 mmHg (~22 mmHg); Ao sat 99%, PA sat 74%.  CO-CI: Fick -> 8.22, 4.44;  thermal 5.37-2.7 (suspect that the discrepancy is related to AV fistula) RECOMMENDATIONS Would recommend medical management for heavily diseased D1 branch.  This is heavily calcified vessel with diffuse disease, will require atherectomy and extensive stent placement in the vessel is roughly 2 to 2.25 mm distally.  Not favorable for PCI. Consider slightly increased volume removal and HD based on elevated filling pressures. Return to nursing for ongoing care Glenetta Hew, MD   Result Date: 07/06/2022   Heavily calcified diffusely diseased vessel: 1st Diag-1 lesion is 99% stenosed. 1st Diag-2 lesion is 60% stenosed. 1st Diag-3 lesion is 95% stenosed.   The left ventricular systolic function is normal. The left ventricular ejection fraction is 50-55% by visual estimate.   LV end diastolic pressure is moderately elevated.   Hemodynamic findings consistent with mild pulmonary hypertension. POSTOP DIAGNOSES Moderate-severe 2-vessel single-vessel disease: Proximal D1-99%, mid 60%, distal 95% (heavily calcified/diffusely diseased; not favorable for PCI would require atherectomy and a relatively small caliber vessel) & 60% ostial sidebranch of OM1 followed by a 60% mid vessel then distal occlusion (with right to left collaterals). Otherwise mild diffuse disease/calcified vessels Mostly preserved LVEF, cannot exclude anterolateral hypokinesis.  LVEDP moderately elevated at 19 mmHg (LVEDP 166/5 mmHg) with a PCWP of ~22 mmHg. RHC numbers: Mean RAP 6 mmHg, RVP-EDP 51/0-5 mmHg; PAP-mean 50/13-27 mmHg; PCWP 17-26 mmHg (~22 mmHg); Ao sat 99%, PA sat 74%.  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  This is heavily calcified vessel with diffuse disease, will require atherectomy and extensive stent placement in the vessel is roughly 2 to 2.25 mm distally.  Not favorable for PCI. Consider  slightly increased volume removal and HD based on elevated filling pressures. Return to nursing for ongoing care Glenetta Hew, MD    Discharge Exam: Vitals:   08/05/22 0647 08/05/22 0724  BP: (!) 118/59 (!) 135/58  Pulse: 64   Resp:  20  Temp:  98.7 F (37.1 C)  SpO2:  100%   Vitals:   08/05/22 0000 08/05/22 0400 08/05/22 0647 08/05/22 0724  BP: (!) 129/57 (!) 123/59 (!) 118/59 (!) 135/58  Pulse: 67 66 64   Resp: _0 Temp: 99.3 F (37.4 C) 98.7 F (37.1 C)  98.7 F (37.1 C)  TempSrc: Oral Oral  Oral  SpO2: 100% 100%  100%  Weight:      Height:        General: Pt is alert, awake, not in acute distress Cardiovascular: RRR, S1/S2 +, no rubs, no gallops Respiratory: CTA bilaterally, no wheezing, no rhonchi Abdominal: Soft, NT, ND, bowel sounds + Extremities: no edema, no cyanosis    The results of significant diagnostics from this hospitalization (including imaging, microbiology, ancillary and laboratory) are listed below for reference.     Microbiology: Recent Results (from the past 240 hour(s))  Resp Panel by RT-PCR (Flu A&B, Covid) Anterior Nasal Swab     Status: Abnormal   Collection Time: 08/02/22  2:51 PM   Specimen: Anterior Nasal Swab  Result Value Ref Range Status   SARS Coronavirus 2 by RT PCR NEGATIVE NEGATIVE Final    Comment: (NOTE) SARS-CoV-2 target nucleic acids are NOT DETECTED.  The SARS-CoV-2 RNA is generally detectable in upper respiratory specimens during the acute phase of infection. The lowest concentration of SARS-CoV-2 viral copies this assay can detect is 138 copies/mL. A negative result does not preclude SARS-Cov-2 infection and should not be used as the sole basis for treatment or other patient management decisions. A negative result may occur with  improper specimen collection/handling, submission of specimen other than nasopharyngeal swab, presence of viral mutation(s) within the areas targeted by this assay, and inadequate  number of viral copies(<138 copies/mL). A negative result must be combined with clinical observations, patient history, and epidemiological information. The expected result is Negative.  Fact Sheet for Patients:  EntrepreneurPulse.com.au  Fact Sheet for Healthcare Providers:  IncredibleEmployment.be  This test is no t yet approved or cleared by the [place] FDA and  has been authorized for detection and/or diagnosis of SARS-CoV-2 by FDA under an Emergency Use Authorization (EUA). This EUA will remain  in effect (meaning this test can be used) for the duration of the COVID-19 declaration under Section 564(b)(1) of the Act, 21 U.S.C.section 360bbb-3(b)(1), unless the authorization is terminated  or revoked sooner.       Influenza A by PCR POSITIVE (A) NEGATIVE Final   Influenza B by PCR NEGATIVE NEGATIVE Final    Comment: (NOTE) The Xpert Xpress SARS-CoV-2/FLU/RSV plus assay is intended as an aid in the diagnosis of influenza from Nasopharyngeal swab specimens and should not be used as a sole basis for treatment. Nasal washings and aspirates are unacceptable for Xpert Xpress SARS-CoV-2/FLU/RSV testing.  Fact Sheet for Patients: EntrepreneurPulse.com.au  Fact Sheet for Healthcare Providers: IncredibleEmployment.be  This test is not yet approved or cleared by the [place] FDA and has been authorized for detection and/or diagnosis of SARS-CoV-2 by FDA under an Emergency Use Authorization (EUA). This EUA will remain in effect (meaning this test can be used) for the duration of the COVID-19 declaration under Section 564(b)(1) of the Act, 21 U.S.C. section 360bbb-3(b)(1), unless the authorization is terminated or revoked.  Performed at [facility], [place] [address] [place], [place] [address]   MRSA Next Gen by PCR, Nasal     Status: None   Collection Time: 08/02/22 10:02 PM   Specimen: Nasal  Mucosa; Nasal Swab  Result Value Ref Range Status   MRSA by PCR Next Gen NOT DETECTED NOT DETECTED Final    Comment: (NOTE) The GeneXpert MRSA Assay (FDA approved for NASAL specimens only), is one component of a comprehensive MRSA colonization surveillance program. It is not intended to diagnose MRSA infection nor to guide or monitor treatment for MRSA infections. Test performance is not FDA approved in patients less than 51 years old. Performed at [facility], [place] [address] [place], [place] [address]      Labs: BNP (last 3 results) Recent Labs    07/05/22 0301  BNP 2,993.7*   Basic Metabolic Panel: Recent Labs  Lab 08/02/22 1355 08/02/22 1413 08/03/22 0710 08/04/22 0814  NA 137 133* 136 133*  K 7.1* 6.8* 4.2 4.5  CL 95* 100 89* 87*  CO2 23  --  31 28  GLUCOSE 94 97 91 85  BUN 53* 49* 28* 47*  CREATININE 12.41* 13.90* 8.52* 10.42*  CALCIUM 8.6*  --  8.8* 8.5*  PHOS  --   --   --  5.2*   Liver Function Tests: Recent Labs  Lab 08/02/22 1355 08/03/22 0710 08/04/22 0814  AST 127* 249*  --   ALT 86* 161*  --   ALKPHOS 68 61  --   BILITOT 0.8 0.7  --   PROT 7.5 7.1  --   ALBUMIN 3.0* 2.8* 2.5*   No results for input(s): "LIPASE", "AMYLASE" in the last 168 hours. No results for input(s): "AMMONIA" in the last 168 hours. CBC: Recent Labs  Lab 08/02/22 1355 08/02/22 1413 08/03/22 0845 08/04/22 0814  WBC 7.1  --  10.6* 7.6  NEUTROABS 5.4  --   --   --  HGB 8.6* 10.2* 9.6* 7.9*  HCT 29.7* 30.0* 30.2* 24.6*  MCV 99.7  --  92.9 91.8  PLT 174  --  166 147*   Cardiac Enzymes: No results for input(s): "CKTOTAL", "CKMB", "CKMBINDEX", "TROPONINI" in the last 168 hours. BNP: Invalid input(s): "POCBNP" CBG: Recent Labs  Lab 08/02/22 1414 08/02/22 1510 08/02/22 1919 08/02/22 2209 08/03/22 0311  GLUCAP 35* 108* 68* 79 91   D-Dimer No results for input(s): "DDIMER" in the last 72 hours. Hgb A1c No results for input(s): "HGBA1C" in the last 72  hours. Lipid Profile No results for input(s): "CHOL", "HDL", "LDLCALC", "TRIG", "CHOLHDL", "LDLDIRECT" in the last 72 hours. Thyroid function studies No results for input(s): "TSH", "T4TOTAL", "T3FREE", "THYROIDAB" in the last 72 hours.  Invalid input(s): "FREET3" Anemia work up No results for input(s): "VITAMINB12", "FOLATE", "FERRITIN", "TIBC", "IRON", "RETICCTPCT" in the last 72 hours. Urinalysis    Component Value Date/Time   COLORURINE AMBER (A) 01/29/2015 2200   APPEARANCEUR CLOUDY (A) 01/29/2015 2200   LABSPEC 1.013 01/29/2015 2200   PHURINE 5.0 01/29/2015 2200   GLUCOSEU NEGATIVE 01/29/2015 2200   HGBUR LARGE (A) 01/29/2015 2200   BILIRUBINUR NEGATIVE 01/29/2015 2200   KETONESUR NEGATIVE 01/29/2015 2200   PROTEINUR 100 (A) 01/29/2015 2200   UROBILINOGEN 0.2 01/29/2015 2200   NITRITE NEGATIVE 01/29/2015 2200   LEUKOCYTESUR NEGATIVE 01/29/2015 2200   Sepsis Labs Recent Labs  Lab 08/02/22 1355 08/03/22 0845 08/04/22 0814  WBC 7.1 10.6* 7.6   Microbiology Recent Results (from the past 240 hour(s))  Resp Panel by RT-PCR (Flu A&B, Covid) Anterior Nasal Swab     Status: Abnormal   Collection Time: 08/02/22  2:51 PM   Specimen: Anterior Nasal Swab  Result Value Ref Range Status   SARS Coronavirus 2 by RT PCR NEGATIVE NEGATIVE Final    Comment: (NOTE) SARS-CoV-2 target nucleic acids are NOT DETECTED.  The SARS-CoV-2 RNA is generally detectable in upper respiratory specimens during the acute phase of infection. The lowest concentration of SARS-CoV-2 viral copies this assay can detect is 138 copies/mL. A negative result does not preclude SARS-Cov-2 infection and should not be used as the sole basis for treatment or other patient management decisions. A negative result may occur with  improper specimen collection/handling, submission of specimen other than nasopharyngeal swab, presence of viral mutation(s) within the areas targeted by this assay, and inadequate number  of viral copies(<138 copies/mL). A negative result must be combined with clinical observations, patient history, and epidemiological information. The expected result is Negative.  Fact Sheet for Patients:  EntrepreneurPulse.com.au  Fact Sheet for Healthcare Providers:  IncredibleEmployment.be  This test is no t yet approved or cleared by the [place] FDA and  has been authorized for detection and/or diagnosis of SARS-CoV-2 by FDA under an Emergency Use Authorization (EUA). This EUA will remain  in effect (meaning this test can be used) for the duration of the COVID-19 declaration under Section 564(b)(1) of the Act, 21 U.S.C.section 360bbb-3(b)(1), unless the authorization is terminated  or revoked sooner.       Influenza A by PCR POSITIVE (A) NEGATIVE Final   Influenza B by PCR NEGATIVE NEGATIVE Final    Comment: (NOTE) The Xpert Xpress SARS-CoV-2/FLU/RSV plus assay is intended as an aid in the diagnosis of influenza from Nasopharyngeal swab specimens and should not be used as a sole basis for treatment. Nasal washings and aspirates are unacceptable for Xpert Xpress SARS-CoV-2/FLU/RSV testing.  Fact Sheet for Patients: EntrepreneurPulse.com.au  Fact Sheet for  Healthcare Providers: IncredibleEmployment.be  This test is not yet approved or cleared by the [place] and has been authorized for detection and/or diagnosis of SARS-CoV-2 by FDA under an Emergency Use Authorization (EUA). This EUA will remain in effect (meaning this test can be used) for the duration of the COVID-19 declaration under Section 564(b)(1) of the Act, 21 U.S.C. section 360bbb-3(b)(1), unless the authorization is terminated or revoked.  Performed at [facility], [place] [address], [place], [place] [address]   MRSA Next Gen by PCR, Nasal     Status: None   Collection Time: 08/02/22 10:02 PM   Specimen: Nasal  Mucosa; Nasal Swab  Result Value Ref Range Status   MRSA by PCR Next Gen NOT DETECTED NOT DETECTED Final    Comment: (NOTE) The GeneXpert MRSA Assay (FDA approved for NASAL specimens only), is one component of a comprehensive MRSA colonization surveillance program. It is not intended to diagnose MRSA infection nor to guide or monitor treatment for MRSA infections. Test performance is not FDA approved in patients less than 56 years old. Performed at [facility], [place] [address] [place], [place] [address]      Time coordinating discharge: Over 30 minutes  SIGNED:   Darliss Cheney, MD  [facility] 08/05/2022, 10:36 AM *Please note that this is a verbal dictation therefore any spelling or grammatical errors are due to the "[place]" system interpretation. If 7PM-7AM, please contact night-coverage [email or web address]

## 2022-08-05 NOTE — Progress Notes (Signed)
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, Severe hyperkalemia, flu a positive  Subjective:  Feels better HD yesterday 3L UF toelrated well Looks like plan is to go home with [facility]  11/22 0701 - 11/23 0700 In: 50 [IV Piggyback:50] Out: 3000   Filed Weights   08/02/22 2158 08/04/22 0500 08/04/22 1132  Weight: 68.9 kg 70.3 kg 67.7 kg    Scheduled Meds:  amLODipine  10 mg Oral Daily   aspirin EC  81 mg Oral Daily   atropine  1 drop Both Eyes TID   brinzolamide  1 drop Both Eyes TID   And   brimonidine  1 drop Both Eyes TID   calcitRIOL  2.75 mcg Oral Q M,W,F-HD   carvedilol  3.125 mg Oral BID WC   Chlorhexidine Gluconate Cloth  6 each Topical Q0600   cinacalcet  90 mg Oral Q M,W,F   heparin  5,000 Units Subcutaneous Q12H   latanoprost  1 drop Both Eyes QHS   ofloxacin  1 drop Both Eyes QID   oseltamivir  30 mg Oral Q M,W,F   pantoprazole  40 mg Oral Daily   prednisoLONE acetate  1 drop Both Eyes Q4H   sevelamer carbonate  1,600 mg Oral TID WC   timolol  1 drop Both Eyes Daily   Continuous Infusions:  piperacillin-tazobactam (ZOSYN)  IV 2.25 g (08/05/22 0647)   PRN Meds:.hydrALAZINE  Current Labs: reviewed   Physical Exam:  Blood pressure (!) 135/58, pulse 64, temperature 98.7 F (37.1 C), temperature source Oral, resp. rate 20, height 5\' 10"  (1.778 m), weight 67.7 kg, SpO2 100 %. GEN: Elderly male, more comfortable appearing, eating breakfast ENT: Some temporal wasting EYES: Eyes sunken CV: Regular, normal S1 and S2 PULM: lessened respiratory effort, diminished in the bases ABD: Soft, nontender SKIN: No rashes or lesions EXT: No significant edema Right forearm graft with bruit and thrill   Outpt HD Orders Unit: [facility] Days: MWF Time: 3.5h Dialyzer: F180 EDW: 69.8kg K/Ca: 2/2 Access: RFA AVG Needle Size: 15g x2 BFR/DFR: 400 / a1.5 UF Proflie: profile 2      VDRA / calcimimetic: calcitriol 2.65mcg qTx; sensipar 90mg  qTx EPO: Mircera 64mcg q2wk;  last given 11/15 IV Fe: Venofer 100mg  x5 due now Heparin: none   Assessment 76M ESRD on iHD with AHRF, Severe hyperkalemia, febrile illness   ESRD MWF [facility] R FA AVG: HD MWF this week even though on holiday as already is disrupted Hyperkalemia: Resovled with HD Influenza A / AHRF / Pulm Edema: Improved.  HD 11/22 Flu A per [facility]; tamiflu Anemia: Hb variable BMD: cont C3 and sensipar; P 5.2 HTN/Vol: as above        Plan HD 11/24 if here, is back on usual outpt HD schedule tomorrow if discharges : 3.5h, AVG, 2K, no heparin, 2-3L UF BP permitting Medication Issues; Preferred narcotic agents for pain control are hydromorphone, fentanyl, and methadone. Morphine should not be used.  Baclofen should be avoided Avoid oral sodium phosphate and magnesium citrate based laxatives / bowel preps    Pearson Grippe MD 08/05/2022, 9:54 AM  Recent Labs  Lab 08/02/22 1355 08/02/22 1413 08/03/22 0710 08/04/22 0814  NA 137 133* 136 133*  K 7.1* 6.8* 4.2 4.5  CL 95* 100 89* 87*  CO2 23  --  31 28  GLUCOSE 94 97 91 85  BUN 53* 49* 28* 47*  CREATININE 12.41* 13.90* 8.52* 10.42*  CALCIUM 8.6*  --  8.8* 8.5*  PHOS  --   --   --  5.2*    Recent Labs  Lab 08/02/22 1355 08/02/22 1413 08/03/22 0845 08/04/22 0814  WBC 7.1  --  10.6* 7.6  NEUTROABS 5.4  --   --   --   HGB 8.6* 10.2* 9.6* 7.9*  HCT 29.7* 30.0* 30.2* 24.6*  MCV 99.7  --  92.9 91.8  PLT 174  --  166 147*

## 2022-08-05 NOTE — Plan of Care (Signed)
  Problem: Education: Goal: Understanding of CV disease, CV risk reduction, and recovery process will improve Outcome: Adequate for Discharge Goal: Individualized Educational Video(s) Outcome: Adequate for Discharge   Problem: Activity: Goal: Ability to return to baseline activity level will improve Outcome: Adequate for Discharge   Problem: Cardiovascular: Goal: Ability to achieve and maintain adequate cardiovascular perfusion will improve Outcome: Adequate for Discharge Goal: Vascular access site(s) Level 0-1 will be maintained Outcome: Adequate for Discharge   Problem: Health Behavior/Discharge Planning: Goal: Ability to safely manage health-related needs after discharge will improve Outcome: Adequate for Discharge   Problem: Education: Goal: Knowledge of cardiac device and self-care will improve Outcome: Adequate for Discharge Goal: Ability to safely manage health related needs after discharge will improve Outcome: Adequate for Discharge Goal: Individualized Educational Video(s) Outcome: Adequate for Discharge   Problem: Cardiac: Goal: Ability to achieve and maintain adequate cardiopulmonary perfusion will improve Outcome: Adequate for Discharge   Problem: Education: Goal: Knowledge of General Education information will improve Description: Including pain rating scale, medication(s)/side effects and non-pharmacologic comfort measures Outcome: Adequate for Discharge   Problem: Health Behavior/Discharge Planning: Goal: Ability to manage health-related needs will improve Outcome: Adequate for Discharge   Problem: Clinical Measurements: Goal: Ability to maintain clinical measurements within normal limits will improve Outcome: Adequate for Discharge Goal: Will remain free from infection Outcome: Adequate for Discharge Goal: Diagnostic test results will improve Outcome: Adequate for Discharge Goal: Respiratory complications will improve Outcome: Adequate for  Discharge Goal: Cardiovascular complication will be avoided Outcome: Adequate for Discharge   Problem: Activity: Goal: Risk for activity intolerance will decrease Outcome: Adequate for Discharge   Problem: Nutrition: Goal: Adequate nutrition will be maintained Outcome: Adequate for Discharge   Problem: Coping: Goal: Level of anxiety will decrease Outcome: Adequate for Discharge   Problem: Elimination: Goal: Will not experience complications related to bowel motility Outcome: Adequate for Discharge Goal: Will not experience complications related to urinary retention Outcome: Adequate for Discharge   Problem: Pain Managment: Goal: General experience of comfort will improve Outcome: Adequate for Discharge   Problem: Safety: Goal: Ability to remain free from injury will improve Outcome: Adequate for Discharge   Problem: Skin Integrity: Goal: Risk for impaired skin integrity will decrease Outcome: Adequate for Discharge   Problem: Acute Rehab PT Goals(only PT should resolve) Goal: Pt Will Go Supine/Side To Sit Outcome: Adequate for Discharge Goal: Pt Will Go Sit To Supine/Side Outcome: Adequate for Discharge Goal: Patient Will Transfer Sit To/From Stand Outcome: Adequate for Discharge Goal: Pt Will Transfer Bed To Chair/Chair To Bed Outcome: Adequate for Discharge Goal: Pt Will Ambulate Outcome: Adequate for Discharge   Problem: Acute Rehab OT Goals (only OT should resolve) Goal: Pt. Will Perform Grooming Outcome: Adequate for Discharge Goal: Pt. Will Transfer To Toilet Outcome: Adequate for Discharge Goal: OT Additional ADL Goal #1 Outcome: Adequate for Discharge

## 2022-08-05 NOTE — Progress Notes (Signed)
   08/05/22 1000  Mobility  Activity Ambulated with assistance to bathroom  Level of Assistance Contact guard assist, steadying assist  Assistive Device Front wheel walker  Distance Ambulated (ft) 8 ft  Activity Response Tolerated well  Mobility Referral Yes  $Mobility charge 1 Mobility   Mobility Specialist Progress Note  Pt was in bed and agreeable. Had no c/o pain throughout ambulation. Left in BR w/ all needs met and RN notified.   Travis [name] Mobility Specialist

## 2022-08-05 NOTE — Progress Notes (Signed)
SATURATION QUALIFICATIONS: (This note is used to comply with regulatory documentation for home oxygen)  Patient Saturations on Room Air at Rest = 100%  Patient Saturations on Room Air while Ambulating = 100%  Patient Saturations on 0 Liters of oxygen while Ambulating = 100%  Please briefly explain why patient needs home oxygen:

## 2022-08-07 ENCOUNTER — Telehealth: Payer: Self-pay | Admitting: Physician Assistant

## 2022-08-07 NOTE — Telephone Encounter (Signed)
Transition of care contact from inpatient facility  Date of discharge: 08/05/22 Date of contact: 08/07/22 Method: Phone Spoke to: Patient's wife  Patient contacted to discuss transition of care from recent inpatient hospitalization. Patient was admitted to [facility] with discharge diagnosis of hyperkalemia, flu A and pneumonia. Patient's wife reports he is doing well.   Medication changes were reviewed. Pt's wife reports he is taking tamiflu and antibiotics.   Patient will follow up with his/her outpatient HD unit on: 08/09/22  Anice Paganini, PA-C 08/07/2022, 10:41 AM  [facility]

## 2022-08-12 ENCOUNTER — Other Ambulatory Visit: Payer: Self-pay

## 2022-08-12 ENCOUNTER — Emergency Department (HOSPITAL_COMMUNITY)
Admission: EM | Admit: 2022-08-12 | Discharge: 2022-08-12 | Disposition: A | Discharge status: Home / Self Care | Payer: No Typology Code available for payment source | Attending: Emergency Medicine | Admitting: Emergency Medicine

## 2022-08-12 ENCOUNTER — Emergency Department (HOSPITAL_COMMUNITY): Payer: No Typology Code available for payment source

## 2022-08-12 ENCOUNTER — Encounter (HOSPITAL_COMMUNITY): Payer: Self-pay

## 2022-08-12 DIAGNOSIS — I5022 Chronic systolic (congestive) heart failure: Secondary | ICD-10-CM | POA: Diagnosis not present

## 2022-08-12 DIAGNOSIS — Z992 Dependence on renal dialysis: Secondary | ICD-10-CM | POA: Insufficient documentation

## 2022-08-12 DIAGNOSIS — N186 End stage renal disease: Secondary | ICD-10-CM | POA: Diagnosis not present

## 2022-08-12 DIAGNOSIS — I132 Hypertensive heart and chronic kidney disease with heart failure and with stage 5 chronic kidney disease, or end stage renal disease: Secondary | ICD-10-CM | POA: Insufficient documentation

## 2022-08-12 DIAGNOSIS — J101 Influenza due to other identified influenza virus with other respiratory manifestations: Secondary | ICD-10-CM | POA: Insufficient documentation

## 2022-08-12 DIAGNOSIS — Z20822 Contact with and (suspected) exposure to covid-19: Secondary | ICD-10-CM | POA: Insufficient documentation

## 2022-08-12 DIAGNOSIS — Z8616 Personal history of COVID-19: Secondary | ICD-10-CM | POA: Diagnosis not present

## 2022-08-12 DIAGNOSIS — Z7982 Long term (current) use of aspirin: Secondary | ICD-10-CM | POA: Diagnosis not present

## 2022-08-12 DIAGNOSIS — I5040 Unspecified combined systolic (congestive) and diastolic (congestive) heart failure: Secondary | ICD-10-CM | POA: Diagnosis not present

## 2022-08-12 DIAGNOSIS — R531 Weakness: Secondary | ICD-10-CM | POA: Diagnosis not present

## 2022-08-12 LAB — CBC WITH DIFFERENTIAL/PLATELET
Abs Immature Granulocytes: 0.1 10*3/uL — ABNORMAL HIGH (ref 0.00–0.07)
Basophils Absolute: 0.1 10*3/uL (ref 0.0–0.1)
Basophils Relative: 0 %
Eosinophils Absolute: 0.4 10*3/uL (ref 0.0–0.5)
Eosinophils Relative: 3 %
HCT: 33 % — ABNORMAL LOW (ref 39.0–52.0)
Hemoglobin: 9.9 g/dL — ABNORMAL LOW (ref 13.0–17.0)
Immature Granulocytes: 1 %
Lymphocytes Relative: 8 %
Lymphs Abs: 1 10*3/uL (ref 0.7–4.0)
MCH: 29.3 pg (ref 26.0–34.0)
MCHC: 30 g/dL (ref 30.0–36.0)
MCV: 97.6 fL (ref 80.0–100.0)
Monocytes Absolute: 1.8 10*3/uL — ABNORMAL HIGH (ref 0.1–1.0)
Monocytes Relative: 15 %
Neutro Abs: 8.9 10*3/uL — ABNORMAL HIGH (ref 1.7–7.7)
Neutrophils Relative %: 73 %
Platelets: 301 10*3/uL (ref 150–400)
RBC: 3.38 MIL/uL — ABNORMAL LOW (ref 4.22–5.81)
RDW: 16.4 % — ABNORMAL HIGH (ref 11.5–15.5)
WBC: 12.3 10*3/uL — ABNORMAL HIGH (ref 4.0–10.5)
nRBC: 0 % (ref 0.0–0.2)

## 2022-08-12 LAB — COMPREHENSIVE METABOLIC PANEL
ALT: 40 U/L (ref 0–44)
AST: 20 U/L (ref 15–41)
Albumin: 2.6 g/dL — ABNORMAL LOW (ref 3.5–5.0)
Alkaline Phosphatase: 61 U/L (ref 38–126)
Anion gap: 18 — ABNORMAL HIGH (ref 5–15)
BUN: 22 mg/dL (ref 8–23)
CO2: 25 mmol/L (ref 22–32)
Calcium: 8.1 mg/dL — ABNORMAL LOW (ref 8.9–10.3)
Chloride: 98 mmol/L (ref 98–111)
Creatinine, Ser: 7.8 mg/dL — ABNORMAL HIGH (ref 0.61–1.24)
GFR, Estimated: 6 mL/min — ABNORMAL LOW (ref >60)
Glucose, Bld: 94 mg/dL (ref 70–99)
Potassium: 4.3 mmol/L (ref 3.5–5.1)
Sodium: 141 mmol/L (ref 135–145)
Total Bilirubin: 0.3 mg/dL (ref 0.3–1.2)
Total Protein: 7 g/dL (ref 6.5–8.1)

## 2022-08-12 LAB — CBG MONITORING, ED
Glucose-Capillary: 45 mg/dL — ABNORMAL LOW (ref 70–99)
Glucose-Capillary: 86 mg/dL (ref 70–99)

## 2022-08-12 LAB — PHOSPHORUS: Phosphorus: 4 mg/dL (ref 2.5–4.6)

## 2022-08-12 LAB — RESP PANEL BY RT-PCR (FLU A&B, COVID) ARPGX2
Influenza A by PCR: POSITIVE — AB
Influenza B by PCR: NEGATIVE
SARS Coronavirus 2 by RT PCR: NEGATIVE

## 2022-08-12 LAB — MAGNESIUM: Magnesium: 2.1 mg/dL (ref 1.7–2.4)

## 2022-08-12 LAB — LACTIC ACID, PLASMA
Lactic Acid, Venous: 3.3 mmol/L (ref 0.5–1.9)
Lactic Acid, Venous: 4.3 mmol/L (ref 0.5–1.9)

## 2022-08-12 MED ORDER — DEXTROSE 50 % IV SOLN: 1.0000 | Freq: Once | INTRAVENOUS | Status: DC

## 2022-08-12 MED ORDER — SODIUM CHLORIDE 0.9 % IV SOLN: INTRAVENOUS | Status: DC

## 2022-08-12 MED ORDER — SODIUM CHLORIDE 0.9 % IV BOLUS: 500.0000 mL | Freq: Once | INTRAVENOUS | Status: DC

## 2022-08-12 NOTE — ED Notes (Signed)
Received verbal report from Connor L RN at this time 

## 2022-08-12 NOTE — Discharge Instructions (Signed)
In discussion with the nephrologist on-call tonight.  He wanted either your primary care doctor or hopefully dialysis center tomorrow to follow-up on your lactic acids which have been elevated.  Again we were concerned about a potential infection.  If you develop any fevers or any new or worse symptoms you need to get seen right away.  Cleared by nephrology for discharge home and follow-up with dialysis tomorrow.  Commend that dialysis center check lactic acids and follow them.

## 2022-08-12 NOTE — ED Triage Notes (Signed)
Pt was a doctor office doing checkup post discharge in hospital on nov 23.  At office he start to feel weak. BP at doctor office was in 70's. EMS was called, BP 114/50 when checked by [facility], radial pulse was palpated in 30's. Heart monitor was placed showing bigeminal PVC. On arrival to ED pt is Aox4, with short-term memory loss of the events at doctor office. Hx kidney disease, dialysis  MWF, 3.5 hr of treatment completed yeterday .

## 2022-08-12 NOTE — ED Provider Notes (Signed)
[facility] EMERGENCY DEPARTMENT Provider Note   CSN: [id number] Arrival date & time: 08/12/22  1356     History  Chief Complaint  Patient presents with   Weakness    Dearis Danis is a 81 y.o. male.  Patient was at [place] office for checkup post discharge from the hospital on November 23.  Patient was admitted November 20 through November 23.  The admission was for acute pulmonary edema altered mental status and cough.  Patient was noted to be influenza a positive at that time.  Patient has a history of kidney disease on dialysis normally dialyzed Monday Wednesdays and Fridays.  Was dialyzed yesterday for 3-1/2 hours.  Has an AV fistula in his right upper extremity.  When he went to the doctor's office today they noted that his blood pressure was in the 42P systolic and that his heart rate was in the 30s.  EMS called when EMS got there blood pressure was 114/50 and his heart rate was in the 70s.  They noted that he was in bigeminy.  And may have been picking up every other beat.  Patient really had no complaints whatsoever.  Felt fine.  Oxygen sats here were 99% does not use oxygen at home Temp 97.8 pulse 62 respirations 18 blood pressure 113/55.  Patient had no shortness of breath had no chest pain.  No abdominal pain no nausea vomiting or diarrhea no upper respiratory symptoms no cough no fever.  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  Non-STEMI in August 2016.  Right and left heart cath July 06, 2022.  Patient never used tobacco products.       Home Medications Prior to Admission medications   Medication Sig Start Date End Date Taking? Authorizing Provider  Accu-Chek Softclix Lancets lancets Use to test blood glucose four times daily as directed.  05/26/21   Thurnell Lose, MD  acetaminophen (TYLENOL) 500 MG tablet Take 500 mg by mouth every 6 (six) hours as needed for moderate pain.    [provider]  amLODipine (NORVASC) 10 MG tablet Take 1 tablet (10 mg total) by mouth daily. 08/05/22   Darliss Cheney, MD  aspirin EC 81 MG EC tablet Take 1 tablet (81 mg total) by mouth daily. 04/23/15   Reyne Dumas, MD  atorvastatin (LIPITOR) 40 MG tablet Take 1 tablet (40 mg total) by mouth daily at 6 PM. 04/23/15   Reyne Dumas, MD  atropine 1 % ophthalmic solution Place 1 drop into the right eye in the morning. 06/23/22   [provider]  Blood Glucose Monitoring Suppl (BLOOD GLUCOSE MONITOR SYSTEM) w/Device KIT Use up to four times daily as directed. 05/26/21   Thurnell Lose, MD  Brinzolamide-Brimonidine 1-0.2 % SUSP Place 1 drop into both eyes in the morning, at noon, and at bedtime. 11/03/21   [provider]  calcitRIOL (ROCALTROL) 0.25 MCG capsule Take 13 capsules (3.25 mcg total) by mouth every Monday, Wednesday, and Friday with hemodialysis. Patient not taking: Reported on 08/03/2022 12/25/21   Cherene Altes, MD  carvedilol (COREG) 12.5 MG tablet Take 1 tablet (12.5 mg total) by mouth 2 (two) times daily with a meal. 07/08/22   Domenic Polite, MD  cinacalcet [facility] 30 MG tablet Take 3 tablets (90 mg total) by mouth every Monday, Wednesday, and Friday with hemodialysis. Patient not  taking: Reported on 08/03/2022 12/25/21   Cherene Altes, MD  ferric gluconate 62.5 mg in sodium chloride 0.9 % 100 mL Inject 62.5 mg into the vein every Wednesday with hemodialysis. Patient not taking: Reported on 08/03/2022 12/30/21   Cherene Altes, MD  gabapentin (NEURONTIN) 300 MG capsule Take 300 mg by mouth daily.    [provider]  glucose blood test strip Use to test blood glucose four times daily as directed 05/26/21   Thurnell Lose, MD  latanoprost (XALATAN) 0.005 % ophthalmic solution Place 1 drop  into both eyes at bedtime. 05/05/21   [provider]  methocarbamol (ROBAXIN) 500 MG tablet Take 500 mg by mouth 3 (three) times daily as needed for muscle spasms.    [provider]  midodrine (PROAMATINE) 10 MG tablet Take 10 mg by mouth every Monday, Wednesday, and Friday. Take 10 mg (1 tablet) by mouth before dialysis and 10 mg (1 tablet) at dialysis as directed.    [provider]  nitroGLYCERIN (NITROSTAT) 0.4 MG SL tablet Place 1 tablet (0.4 mg total) under the tongue every 5 (five) minutes as needed for chest pain (for total of 3 doses at the most). 07/08/22   Domenic Polite, MD  omeprazole (PRILOSEC) 40 MG capsule Take 40 mg by mouth daily. 07/31/15   [provider]  prednisoLONE acetate (PRED FORTE) 1 % ophthalmic suspension Place 1 drop into the right eye 6 (six) times daily. 06/23/22   [provider]  sevelamer carbonate (RENVELA) 800 MG tablet Take 2 tablets (1,600 mg total) by mouth 3 (three) times daily with meals. Patient taking differently: Take 3,200 mg by mouth 3 (three) times daily with meals. 05/17/15   Cherene Altes, MD  timolol (TIMOPTIC-XR) 0.5 % ophthalmic gel-forming Place 1 drop into both eyes daily. 11/03/21   [provider]      Allergies    Patient has no active allergies.    Review of Systems   Review of Systems  Constitutional:  Negative for chills and fever.  HENT:  Negative for rhinorrhea and sore throat.   Eyes:  Negative for visual disturbance.  Respiratory:  Negative for cough and shortness of breath.   Cardiovascular:  Negative for chest pain and leg swelling.  Gastrointestinal:  Negative for abdominal pain, diarrhea, nausea and vomiting.  Genitourinary:  Negative for dysuria.  Musculoskeletal:  Negative for back pain and neck pain.  Skin:  Negative for rash.  Neurological:  Negative for dizziness, light-headedness and headaches.  Hematological:  Bruises/bleeds easily.  Psychiatric/Behavioral:   Negative for confusion.     Physical Exam Updated Vital Signs BP (!) 133/57   Pulse 100   Temp 97.9 F (36.6 C) (Oral)   Resp 13   Ht 1.778 m (_0 )   Wt 74.8 kg   SpO2 96%   BMI 23.68 kg/m  Physical Exam Vitals and nursing note reviewed.  Constitutional:      General: He is not in acute distress.    Appearance: Normal appearance. He is well-developed. He is not ill-appearing.  HENT:     Head: Normocephalic and atraumatic.     Mouth/Throat:     Mouth: Mucous membranes are moist.  Eyes:     Extraocular Movements: Extraocular movements intact.     Conjunctiva/sclera: Conjunctivae normal.     Pupils: Pupils are equal, round, and reactive to light.  Cardiovascular:     Rate and Rhythm: Normal rate and regular rhythm.  Heart sounds: No murmur heard. Pulmonary:     Effort: Pulmonary effort is normal. No respiratory distress.     Breath sounds: Normal breath sounds.  Abdominal:     Palpations: Abdomen is soft.     Tenderness: There is no abdominal tenderness.  Musculoskeletal:        General: No swelling.     Cervical back: Neck supple.     Comments: AV fistula right upper extremity with good thrill.  Skin:    General: Skin is warm and dry.     Capillary Refill: Capillary refill takes less than 2 seconds.  Neurological:     General: No focal deficit present.     Mental Status: He is alert and oriented to person, place, and time.  Psychiatric:        Mood and Affect: Mood normal.     ED Results / Procedures / Treatments   Labs (all labs ordered are listed, but only abnormal results are displayed) Labs Reviewed  RESP PANEL BY RT-PCR (FLU A&B, COVID) ARPGX2 - Abnormal; Notable for the following components:      Result Value   Influenza A by PCR POSITIVE (*)    All other components within normal limits  COMPREHENSIVE METABOLIC PANEL - Abnormal; Notable for the following components:   Creatinine, Ser 7.80 (*)    Calcium 8.1 (*)    Albumin 2.6 (*)    GFR,  Estimated 6 (*)    Anion gap 18 (*)    All other components within normal limits  CBC WITH DIFFERENTIAL/PLATELET - Abnormal; Notable for the following components:   WBC 12.3 (*)    RBC 3.38 (*)    Hemoglobin 9.9 (*)    HCT 33.0 (*)    RDW 16.4 (*)    Neutro Abs 8.9 (*)    Monocytes Absolute 1.8 (*)    Abs Immature Granulocytes 0.10 (*)    All other components within normal limits  LACTIC ACID, PLASMA - Abnormal; Notable for the following components:   Lactic Acid, Venous 3.3 (*)    All other components within normal limits  LACTIC ACID, PLASMA - Abnormal; Notable for the following components:   Lactic Acid, Venous 4.3 (*)    All other components within normal limits  CBG MONITORING, ED - Abnormal; Notable for the following components:   Glucose-Capillary 45 (*)    All other components within normal limits  CULTURE, BLOOD (ROUTINE X 2)  CULTURE, BLOOD (ROUTINE X 2)  MAGNESIUM  PHOSPHORUS  CBG MONITORING, ED    EKG EKG Interpretation  Date/Time:  Thursday August 12 2022 14:30:20 EST Ventricular Rate:  63 PR Interval:  228 QRS Duration: 106 QT Interval:  476 QTC Calculation: 488 R Axis:   -40 Text Interpretation: Sinus rhythm Multiple ventricular premature complexes Prolonged PR interval Consider left atrial enlargement Left ventricular hypertrophy Borderline prolonged QT interval Confirmed by Fredia Sorrow [id number]) on 08/12/2022 5:43:39 PM  Radiology DG Chest Port 1 View  Result Date: 08/12/2022 CLINICAL DATA:  Evaluate for pulmonary edema EXAM: PORTABLE CHEST 1 VIEW COMPARISON:  chest x-ray dated August 03, 2022 FINDINGS: Cardiomegaly. Interval resolution of density overlying the right lower hemithorax. Lungs are clear. No evidence of pleural effusion or pneumothorax. IMPRESSION: No evidence of pulmonary edema. Electronically Signed   By: Yetta Glassman M.D.   On: 08/12/2022 15:24    Procedures Procedures    Medications Ordered in ED Medications  0.9 %   sodium chloride infusion (has no administration in  time range)    ED Course/ Medical Decision Making/ A&P                           Medical Decision Making Amount and/or Complexity of Data Reviewed Labs: ordered. Radiology: ordered.  Risk Prescription drug management.   Work-up here without any significant findings other than the lactic acid.  Patient's vital signs very stable.  Good room air around 94%.  Potassium 4.3.  Liver function tests normal.  Did have an anion gap of 18 CBC White count 12.3 hemoglobin 9.9.  Platelets normal at 301.  Initial lactic acid was three-point 3 repeat went up to 4.3.  Magnesium was normal phosphorus was normal patient still positive for influenza A which is not surprising.  Initial blood sugar was erroneous to 45 immediately repeated it was 86.  Chest x-ray no evidence of pulmonary edema.  Discussed with on-call nephrology.  Went over the lactic acid was wondering whether we needed to admit him for that.  But he felt that that alone without any other acute findings only warranted blood cultures and that he could follow-up with dialysis tomorrow and they could follow his lactic acids.  So given the okay to go home by nephrology.  Blood cultures done and are pending.  She has remained very stable.  Very anxious to go home.     Final Clinical Impression(s) / ED Diagnoses Final diagnoses:  End stage renal disease on dialysis [facility])    Rx / DC Orders ED Discharge Orders     None         Fredia Sorrow, MD 08/12/22 2110

## 2022-08-16 DIAGNOSIS — D631 Anemia in chronic kidney disease: Secondary | ICD-10-CM | POA: Diagnosis not present

## 2022-08-16 DIAGNOSIS — I272 Pulmonary hypertension, unspecified: Secondary | ICD-10-CM | POA: Diagnosis not present

## 2022-08-16 DIAGNOSIS — M3504 Sicca syndrome with tubulo-interstitial nephropathy: Secondary | ICD-10-CM | POA: Diagnosis not present

## 2022-08-16 DIAGNOSIS — J8410 Pulmonary fibrosis, unspecified: Secondary | ICD-10-CM | POA: Diagnosis not present

## 2022-08-16 DIAGNOSIS — N186 End stage renal disease: Secondary | ICD-10-CM | POA: Diagnosis not present

## 2022-08-16 DIAGNOSIS — I132 Hypertensive heart and chronic kidney disease with heart failure and with stage 5 chronic kidney disease, or end stage renal disease: Secondary | ICD-10-CM | POA: Diagnosis not present

## 2022-08-16 DIAGNOSIS — I5032 Chronic diastolic (congestive) heart failure: Secondary | ICD-10-CM | POA: Diagnosis not present

## 2022-08-16 DIAGNOSIS — G9341 Metabolic encephalopathy: Secondary | ICD-10-CM | POA: Diagnosis not present

## 2022-08-16 DIAGNOSIS — J9621 Acute and chronic respiratory failure with hypoxia: Secondary | ICD-10-CM | POA: Diagnosis not present

## 2022-08-16 DIAGNOSIS — M170 Bilateral primary osteoarthritis of knee: Secondary | ICD-10-CM | POA: Diagnosis not present

## 2022-08-16 DIAGNOSIS — I5023 Acute on chronic systolic (congestive) heart failure: Secondary | ICD-10-CM | POA: Diagnosis not present

## 2022-08-17 LAB — CULTURE, BLOOD (ROUTINE X 2)
Culture: NO GROWTH
Culture: NO GROWTH

## 2022-08-19 ENCOUNTER — Ambulatory Visit: Payer: Medicare Other | Admitting: Cardiology

## 2022-09-19 ENCOUNTER — Inpatient Hospital Stay (HOSPITAL_COMMUNITY)
Admission: EM | Admit: 2022-09-19 | Discharge: 2022-09-21 | DRG: 640 | Disposition: A | Discharge status: Home / Self Care | Payer: No Typology Code available for payment source | Attending: Family Medicine | Admitting: Family Medicine

## 2022-09-19 ENCOUNTER — Inpatient Hospital Stay (HOSPITAL_COMMUNITY): Payer: No Typology Code available for payment source

## 2022-09-19 ENCOUNTER — Encounter (HOSPITAL_COMMUNITY): Payer: Self-pay | Admitting: Internal Medicine

## 2022-09-19 ENCOUNTER — Emergency Department (HOSPITAL_COMMUNITY): Payer: No Typology Code available for payment source

## 2022-09-19 DIAGNOSIS — N250 Renal osteodystrophy: Secondary | ICD-10-CM | POA: Diagnosis present

## 2022-09-19 DIAGNOSIS — I5042 Chronic combined systolic (congestive) and diastolic (congestive) heart failure: Secondary | ICD-10-CM | POA: Diagnosis present

## 2022-09-19 DIAGNOSIS — M1710 Unilateral primary osteoarthritis, unspecified knee: Secondary | ICD-10-CM | POA: Diagnosis present

## 2022-09-19 DIAGNOSIS — R609 Edema, unspecified: Secondary | ICD-10-CM | POA: Diagnosis not present

## 2022-09-19 DIAGNOSIS — M7989 Other specified soft tissue disorders: Secondary | ICD-10-CM | POA: Diagnosis not present

## 2022-09-19 DIAGNOSIS — I252 Old myocardial infarction: Secondary | ICD-10-CM

## 2022-09-19 DIAGNOSIS — I132 Hypertensive heart and chronic kidney disease with heart failure and with stage 5 chronic kidney disease, or end stage renal disease: Secondary | ICD-10-CM | POA: Diagnosis present

## 2022-09-19 DIAGNOSIS — H409 Unspecified glaucoma: Secondary | ICD-10-CM | POA: Diagnosis present

## 2022-09-19 DIAGNOSIS — E875 Hyperkalemia: Secondary | ICD-10-CM | POA: Diagnosis present

## 2022-09-19 DIAGNOSIS — Z79899 Other long term (current) drug therapy: Secondary | ICD-10-CM | POA: Diagnosis not present

## 2022-09-19 DIAGNOSIS — Z7982 Long term (current) use of aspirin: Secondary | ICD-10-CM | POA: Diagnosis not present

## 2022-09-19 DIAGNOSIS — Z1152 Encounter for screening for COVID-19: Secondary | ICD-10-CM | POA: Diagnosis not present

## 2022-09-19 DIAGNOSIS — Z86718 Personal history of other venous thrombosis and embolism: Secondary | ICD-10-CM

## 2022-09-19 DIAGNOSIS — M3500 Sicca syndrome, unspecified: Secondary | ICD-10-CM | POA: Diagnosis present

## 2022-09-19 DIAGNOSIS — I2510 Atherosclerotic heart disease of native coronary artery without angina pectoris: Secondary | ICD-10-CM | POA: Diagnosis present

## 2022-09-19 DIAGNOSIS — J810 Acute pulmonary edema: Principal | ICD-10-CM

## 2022-09-19 DIAGNOSIS — Z992 Dependence on renal dialysis: Secondary | ICD-10-CM

## 2022-09-19 DIAGNOSIS — K219 Gastro-esophageal reflux disease without esophagitis: Secondary | ICD-10-CM | POA: Diagnosis present

## 2022-09-19 DIAGNOSIS — D631 Anemia in chronic kidney disease: Secondary | ICD-10-CM | POA: Diagnosis present

## 2022-09-19 DIAGNOSIS — Z9981 Dependence on supplemental oxygen: Secondary | ICD-10-CM | POA: Diagnosis not present

## 2022-09-19 DIAGNOSIS — E8770 Fluid overload, unspecified: Principal | ICD-10-CM | POA: Diagnosis present

## 2022-09-19 DIAGNOSIS — J8410 Pulmonary fibrosis, unspecified: Secondary | ICD-10-CM | POA: Diagnosis present

## 2022-09-19 DIAGNOSIS — Z8249 Family history of ischemic heart disease and other diseases of the circulatory system: Secondary | ICD-10-CM

## 2022-09-19 DIAGNOSIS — I10 Essential (primary) hypertension: Secondary | ICD-10-CM | POA: Diagnosis not present

## 2022-09-19 DIAGNOSIS — N186 End stage renal disease: Secondary | ICD-10-CM | POA: Diagnosis present

## 2022-09-19 DIAGNOSIS — Z91158 Patient's noncompliance with renal dialysis for other reason: Secondary | ICD-10-CM

## 2022-09-19 DIAGNOSIS — N185 Chronic kidney disease, stage 5: Secondary | ICD-10-CM

## 2022-09-19 DIAGNOSIS — N289 Disorder of kidney and ureter, unspecified: Secondary | ICD-10-CM

## 2022-09-19 DIAGNOSIS — N2581 Secondary hyperparathyroidism of renal origin: Secondary | ICD-10-CM | POA: Diagnosis present

## 2022-09-19 LAB — CREATININE, SERUM
Creatinine, Ser: 10.34 mg/dL — ABNORMAL HIGH (ref 0.61–1.24)
GFR, Estimated: 5 mL/min — ABNORMAL LOW (ref >60)

## 2022-09-19 LAB — BASIC METABOLIC PANEL
Anion gap: 18 — ABNORMAL HIGH (ref 5–15)
Anion gap: 17 — ABNORMAL HIGH (ref 5–15)
BUN: 90 mg/dL — ABNORMAL HIGH (ref 8–23)
BUN: 91 mg/dL — ABNORMAL HIGH (ref 8–23)
CO2: 25 mmol/L (ref 22–32)
CO2: 23 mmol/L (ref 22–32)
Calcium: 9.2 mg/dL (ref 8.9–10.3)
Calcium: 8.8 mg/dL — ABNORMAL LOW (ref 8.9–10.3)
Chloride: 97 mmol/L — ABNORMAL LOW (ref 98–111)
Chloride: 97 mmol/L — ABNORMAL LOW (ref 98–111)
Creatinine, Ser: 15.4 mg/dL — ABNORMAL HIGH (ref 0.61–1.24)
Creatinine, Ser: 15.54 mg/dL — ABNORMAL HIGH (ref 0.61–1.24)
GFR, Estimated: 3 mL/min — ABNORMAL LOW (ref >60)
GFR, Estimated: 3 mL/min — ABNORMAL LOW (ref >60)
Glucose, Bld: 101 mg/dL — ABNORMAL HIGH (ref 70–99)
Glucose, Bld: 94 mg/dL (ref 70–99)
Potassium: 6.7 mmol/L (ref 3.5–5.1)
Potassium: 5.9 mmol/L — ABNORMAL HIGH (ref 3.5–5.1)
Sodium: 140 mmol/L (ref 135–145)
Sodium: 137 mmol/L (ref 135–145)

## 2022-09-19 LAB — HEPATIC FUNCTION PANEL
ALT: 5 U/L (ref 0–44)
AST: 19 U/L (ref 15–41)
Albumin: 3.1 g/dL — ABNORMAL LOW (ref 3.5–5.0)
Alkaline Phosphatase: 76 U/L (ref 38–126)
Bilirubin, Direct: 0.3 mg/dL — ABNORMAL HIGH (ref 0.0–0.2)
Indirect Bilirubin: 0.5 mg/dL (ref 0.3–0.9)
Total Bilirubin: 0.8 mg/dL (ref 0.3–1.2)
Total Protein: 7.7 g/dL (ref 6.5–8.1)

## 2022-09-19 LAB — CBC
HCT: 38.4 % — ABNORMAL LOW (ref 39.0–52.0)
HCT: 35.4 % — ABNORMAL LOW (ref 39.0–52.0)
Hemoglobin: 11.6 g/dL — ABNORMAL LOW (ref 13.0–17.0)
Hemoglobin: 10.9 g/dL — ABNORMAL LOW (ref 13.0–17.0)
MCH: 28.6 pg (ref 26.0–34.0)
MCH: 28.9 pg (ref 26.0–34.0)
MCHC: 30.2 g/dL (ref 30.0–36.0)
MCHC: 30.8 g/dL (ref 30.0–36.0)
MCV: 94.6 fL (ref 80.0–100.0)
MCV: 93.9 fL (ref 80.0–100.0)
Platelets: 228 10*3/uL (ref 150–400)
Platelets: 175 10*3/uL (ref 150–400)
RBC: 4.06 MIL/uL — ABNORMAL LOW (ref 4.22–5.81)
RBC: 3.77 MIL/uL — ABNORMAL LOW (ref 4.22–5.81)
RDW: 17.8 % — ABNORMAL HIGH (ref 11.5–15.5)
RDW: 17.7 % — ABNORMAL HIGH (ref 11.5–15.5)
WBC: 9.7 10*3/uL (ref 4.0–10.5)
WBC: 8 10*3/uL (ref 4.0–10.5)
nRBC: 0 % (ref 0.0–0.2)
nRBC: 0 % (ref 0.0–0.2)

## 2022-09-19 LAB — RESP PANEL BY RT-PCR (RSV, FLU A&B, COVID)  RVPGX2
Influenza A by PCR: NEGATIVE
Influenza B by PCR: NEGATIVE
Resp Syncytial Virus by PCR: NEGATIVE
SARS Coronavirus 2 by RT PCR: NEGATIVE

## 2022-09-19 LAB — CBG MONITORING, ED
Glucose-Capillary: 70 mg/dL (ref 70–99)
Glucose-Capillary: 83 mg/dL (ref 70–99)

## 2022-09-19 LAB — D-DIMER, QUANTITATIVE: D-Dimer, Quant: 3.83 ug/mL-FEU — ABNORMAL HIGH (ref 0.00–0.50)

## 2022-09-19 LAB — HEPATITIS B SURFACE ANTIGEN: Hepatitis B Surface Ag: NONREACTIVE

## 2022-09-19 LAB — TROPONIN I (HIGH SENSITIVITY)
Troponin I (High Sensitivity): 128 ng/L (ref <18)
Troponin I (High Sensitivity): 143 ng/L (ref <18)

## 2022-09-19 LAB — BRAIN NATRIURETIC PEPTIDE: B Natriuretic Peptide: 1977.6 pg/mL — ABNORMAL HIGH (ref 0.0–100.0)

## 2022-09-19 MED ORDER — ASPIRIN 81 MG PO TBEC
81.0000 mg | DELAYED_RELEASE_TABLET | Freq: Every day | ORAL | Status: DC
  Administered 2022-09-20: 81 mg via ORAL
  Filled 2022-09-19: qty 1

## 2022-09-19 MED ORDER — LIDOCAINE HCL (PF) 1 % IJ SOLN: 5.0000 mL | INTRAMUSCULAR | Status: DC | PRN

## 2022-09-19 MED ORDER — BRIMONIDINE TARTRATE 0.2 % OP SOLN
1.0000 [drp] | Freq: Three times a day (TID) | OPHTHALMIC | Status: DC
  Administered 2022-09-20 (×2): 1 [drp] via OPHTHALMIC
  Filled 2022-09-19: qty 5

## 2022-09-19 MED ORDER — HEPARIN SODIUM (PORCINE) 5000 UNIT/ML IJ SOLN
5000.0000 [IU] | Freq: Three times a day (TID) | INTRAMUSCULAR | Status: DC
  Administered 2022-09-19 – 2022-09-21 (×5): 5000 [IU] via SUBCUTANEOUS
  Filled 2022-09-19 (×5): qty 1

## 2022-09-19 MED ORDER — SEVELAMER CARBONATE 800 MG PO TABS
1600.0000 mg | ORAL_TABLET | Freq: Three times a day (TID) | ORAL | Status: DC
  Administered 2022-09-20 (×2): 1600 mg via ORAL
  Filled 2022-09-19 (×2): qty 2

## 2022-09-19 MED ORDER — PENTAFLUOROPROP-TETRAFLUOROETH EX AERO: 1.0000 | INHALATION_SPRAY | CUTANEOUS | Status: DC | PRN

## 2022-09-19 MED ORDER — ACETAMINOPHEN 650 MG RE SUPP: 650.0000 mg | Freq: Four times a day (QID) | RECTAL | Status: DC | PRN

## 2022-09-19 MED ORDER — DEXTROSE 50 % IV SOLN
1.0000 | Freq: Once | INTRAVENOUS | Status: DC
  Filled 2022-09-19: qty 50

## 2022-09-19 MED ORDER — HEPARIN SODIUM (PORCINE) 1000 UNIT/ML DIALYSIS: 1000.0000 [IU] | INTRAMUSCULAR | Status: DC | PRN

## 2022-09-19 MED ORDER — ALBUTEROL SULFATE (2.5 MG/3ML) 0.083% IN NEBU
5.0000 mg | INHALATION_SOLUTION | Freq: Once | RESPIRATORY_TRACT | Status: AC
  Administered 2022-09-19: 5 mg via RESPIRATORY_TRACT
  Filled 2022-09-19: qty 6

## 2022-09-19 MED ORDER — INSULIN ASPART 100 UNIT/ML IV SOLN: 5.0000 [IU] | Freq: Once | INTRAVENOUS | Status: DC

## 2022-09-19 MED ORDER — BRINZOLAMIDE 1 % OP SUSP
1.0000 [drp] | Freq: Three times a day (TID) | OPHTHALMIC | Status: DC
  Administered 2022-09-20 (×2): 1 [drp] via OPHTHALMIC
  Filled 2022-09-19: qty 10

## 2022-09-19 MED ORDER — GABAPENTIN 300 MG PO CAPS
300.0000 mg | ORAL_CAPSULE | Freq: Every day | ORAL | Status: DC
  Administered 2022-09-20: 300 mg via ORAL
  Filled 2022-09-19: qty 1

## 2022-09-19 MED ORDER — ANTICOAGULANT SODIUM CITRATE 4% (200MG/5ML) IV SOLN: 5.0000 mL | Status: DC | PRN

## 2022-09-19 MED ORDER — TIMOLOL MALEATE 0.5 % OP SOLG
1.0000 [drp] | Freq: Every day | OPHTHALMIC | Status: DC
  Filled 2022-09-19: qty 5

## 2022-09-19 MED ORDER — CHLORHEXIDINE GLUCONATE CLOTH 2 % EX PADS
6.0000 | MEDICATED_PAD | Freq: Every day | CUTANEOUS | Status: DC
  Administered 2022-09-21: 6 via TOPICAL

## 2022-09-19 MED ORDER — PENTAFLUOROPROP-TETRAFLUOROETH EX AERO
INHALATION_SPRAY | CUTANEOUS | Status: AC
  Filled 2022-09-19: qty 30

## 2022-09-19 MED ORDER — PANTOPRAZOLE SODIUM 40 MG PO TBEC
40.0000 mg | DELAYED_RELEASE_TABLET | Freq: Every day | ORAL | Status: DC
  Administered 2022-09-20: 40 mg via ORAL
  Filled 2022-09-19: qty 1

## 2022-09-19 MED ORDER — LIDOCAINE-PRILOCAINE 2.5-2.5 % EX CREA: 1.0000 | TOPICAL_CREAM | CUTANEOUS | Status: DC | PRN

## 2022-09-19 MED ORDER — SODIUM ZIRCONIUM CYCLOSILICATE 10 G PO PACK
10.0000 g | PACK | Freq: Once | ORAL | Status: AC
  Administered 2022-09-19: 10 g via ORAL
  Filled 2022-09-19: qty 1

## 2022-09-19 MED ORDER — ACETAMINOPHEN 325 MG PO TABS: 650.0000 mg | ORAL_TABLET | Freq: Four times a day (QID) | ORAL | Status: DC | PRN

## 2022-09-19 MED ORDER — ALBUTEROL SULFATE (2.5 MG/3ML) 0.083% IN NEBU: 3.0000 mL | INHALATION_SOLUTION | Freq: Four times a day (QID) | RESPIRATORY_TRACT | Status: DC | PRN

## 2022-09-19 MED ORDER — LATANOPROST 0.005 % OP SOLN
1.0000 [drp] | Freq: Every day | OPHTHALMIC | Status: DC
  Administered 2022-09-20: 1 [drp] via OPHTHALMIC
  Filled 2022-09-19: qty 2.5

## 2022-09-19 NOTE — ED Notes (Signed)
Critical Lab value with Troponin of 143. MD notified

## 2022-09-19 NOTE — ED Provider Triage Note (Signed)
Emergency Medicine Provider Triage Evaluation Note  Travis [name] , a 82 y.o. male  was evaluated in triage.  Pt complains of shortness of breath.  He states that he felt fatigued and slightly unwell Friday missed his Monday Wednesday Friday dialysis and has become more short of breath since that time. No fevers.  No chest pain.  No pain at all  Review of Systems  Positive: Shortness of breath. Negative: Fever  Physical Exam  BP (!) 160/85 (BP Location: Left Arm)   Pulse 80   Resp 14   SpO2 100%  Gen:   Awake, no distress   Resp:  Normal effort  MSK:   Moves extremities without difficulty  Other:  Bilateral crackles in bilateral lower bases.  Medical Decision Making  Medically screening exam initiated at 6:44 AM.  Appropriate orders placed.  Travis [name] was informed that the remainder of the evaluation will be completed by another provider, this initial triage assessment does not replace that evaluation, and the importance of remaining in the ED until their evaluation is complete.  Chest x-ray, labs, EKG   Travis [name], [place] 09/19/22 [phone]

## 2022-09-19 NOTE — ED Notes (Signed)
Consent signed. Placed behind bed

## 2022-09-19 NOTE — ED Notes (Signed)
Critical troponin level of 128 reported by lab, MD notified

## 2022-09-19 NOTE — ED Notes (Signed)
[facility] notified of K+. Will prioritize pt to treatment room.

## 2022-09-19 NOTE — ED Provider Notes (Signed)
[facility] EMERGENCY DEPARTMENT Provider Note   CSN: [id number] Arrival date & time: 09/19/22  6948     History  Chief Complaint  Patient presents with   Shortness of Breath    Missed Hemodialysis Treatment    Travis [name] is a 82 y.o. male.   Shortness of Breath    Patient has history of arthritis pneumonia reflux Sjogren's disease, pulmonary fibrosis, end-stage renal disease on dialysis.  Hypertension, CHF, DVT, syncope, coronary artery disease who presents to the ED with complaints of shortness of breath.  Patient states he gets dialyzed Monday Wednesday Friday. Patient states he was not feeling up to having dialysis on Friday so he did not go.  Patient states in the last day or 2 he has had increasing trouble with congestion and shortness of breath.  He denies any fevers although does not have a thermometer to measure at home.  He denies any vomiting or diarrhea. Home Medications Prior to Admission medications   Medication Sig Start Date End Date Taking? Authorizing Provider  Accu-Chek Softclix Lancets lancets Use to test blood glucose four times daily as directed. 05/26/21   Thurnell Lose, MD  acetaminophen (TYLENOL) 500 MG tablet Take 500 mg by mouth every 6 (six) hours as needed for moderate pain.    [provider]  amLODipine (NORVASC) 10 MG tablet Take 1 tablet (10 mg total) by mouth daily. 08/05/22   Darliss Cheney, MD  aspirin EC 81 MG EC tablet Take 1 tablet (81 mg total) by mouth daily. 04/23/15   Reyne Dumas, MD  atorvastatin (LIPITOR) 40 MG tablet Take 1 tablet (40 mg total) by mouth daily at 6 PM. 04/23/15   Reyne Dumas, MD  atropine 1 % ophthalmic solution Place 1 drop into the right eye in the morning. 06/23/22   [provider]  Blood Glucose Monitoring Suppl (BLOOD GLUCOSE MONITOR SYSTEM) w/Device KIT Use up to four times daily as directed. 05/26/21   Thurnell Lose, MD  Brinzolamide-Brimonidine 1-0.2 % SUSP Place 1  drop into both eyes in the morning, at noon, and at bedtime. 11/03/21   [provider]  calcitRIOL (ROCALTROL) 0.25 MCG capsule Take 13 capsules (3.25 mcg total) by mouth every Monday, Wednesday, and Friday with hemodialysis. Patient not taking: Reported on 08/03/2022 12/25/21   Cherene Altes, MD  carvedilol (COREG) 12.5 MG tablet Take 1 tablet (12.5 mg total) by mouth 2 (two) times daily with a meal. 07/08/22   Domenic Polite, MD  cinacalcet [facility]) 30 MG tablet Take 3 tablets (90 mg total) by mouth every Monday, Wednesday, and Friday with hemodialysis. Patient not taking: Reported on 08/03/2022 12/25/21   Cherene Altes, MD  ferric gluconate 62.5 mg in sodium chloride 0.9 % 100 mL Inject 62.5 mg into the vein every Wednesday with hemodialysis. Patient not taking: Reported on 08/03/2022 12/30/21   Cherene Altes, MD  gabapentin (NEURONTIN) 300 MG capsule Take 300 mg by mouth daily.    [provider]  glucose blood test strip Use to test blood glucose four times daily as directed 05/26/21   Thurnell Lose, MD  latanoprost (XALATAN) 0.005 % ophthalmic solution Place 1 drop into both eyes at bedtime. 05/05/21   [provider]  methocarbamol (ROBAXIN) 500 MG tablet Take 500 mg by mouth 3 (three) times daily as needed for muscle spasms.    [provider]  midodrine (PROAMATINE) 10 MG tablet Take 10 mg by mouth every Monday, Wednesday,  and Friday. Take 10 mg (1 tablet) by mouth before dialysis and 10 mg (1 tablet) at dialysis as directed.    [provider]  nitroGLYCERIN (NITROSTAT) 0.4 MG SL tablet Place 1 tablet (0.4 mg total) under the tongue every 5 (five) minutes as needed for chest pain (for total of 3 doses at the most). 07/08/22   Domenic Polite, MD  omeprazole (PRILOSEC) 40 MG capsule Take 40 mg by mouth daily. 07/31/15   [provider]  prednisoLONE acetate (PRED FORTE) 1 % ophthalmic suspension Place 1 drop into the right  eye 6 (six) times daily. 06/23/22   [provider]  sevelamer carbonate (RENVELA) 800 MG tablet Take 2 tablets (1,600 mg total) by mouth 3 (three) times daily with meals. Patient taking differently: Take 3,200 mg by mouth 3 (three) times daily with meals. 05/17/15   Cherene Altes, MD  timolol (TIMOPTIC-XR) 0.5 % ophthalmic gel-forming Place 1 drop into both eyes daily. 11/03/21   [provider]      Allergies    Patient has no known allergies.    Review of Systems   Review of Systems  Respiratory:  Positive for shortness of breath.     Physical Exam Updated Vital Signs BP (!) 176/86   Pulse 93   Temp 98 F (36.7 C) (Oral)   Resp 14   SpO2 98%  Physical Exam Vitals and nursing note reviewed.  Constitutional:      Appearance: He is well-developed. He is not diaphoretic.  HENT:     Head: Normocephalic and atraumatic.     Right Ear: External ear normal.     Left Ear: External ear normal.  Eyes:     General: No scleral icterus.       Right eye: No discharge.        Left eye: No discharge.     Conjunctiva/sclera: Conjunctivae normal.  Neck:     Trachea: No tracheal deviation.  Cardiovascular:     Rate and Rhythm: Normal rate and regular rhythm.  Pulmonary:     Effort: Pulmonary effort is normal. No respiratory distress.     Breath sounds: Normal breath sounds. No stridor. No wheezing or rales.  Abdominal:     General: Bowel sounds are normal. There is no distension.     Palpations: Abdomen is soft.     Tenderness: There is no abdominal tenderness. There is no guarding or rebound.  Musculoskeletal:        General: No tenderness or deformity.     Cervical back: Neck supple.  Skin:    General: Skin is warm and dry.     Findings: No rash.  Neurological:     General: No focal deficit present.     Mental Status: He is alert.     Cranial Nerves: No cranial nerve deficit, dysarthria or facial asymmetry.     Sensory: No sensory deficit.     Motor: No  abnormal muscle tone or seizure activity.     Coordination: Coordination normal.  Psychiatric:        Mood and Affect: Mood normal.     ED Results / Procedures / Treatments   Labs (all labs ordered are listed, but only abnormal results are displayed) Labs Reviewed  CBC - Abnormal; Notable for the following components:      Result Value   RBC 4.06 (*)    Hemoglobin 11.6 (*)    HCT 38.4 (*)    RDW 17.8 (*)  All other components within normal limits  BASIC METABOLIC PANEL - Abnormal; Notable for the following components:   Potassium 6.7 (*)    Chloride 97 (*)    Glucose, Bld 101 (*)    BUN 90 (*)    Creatinine, Ser 15.40 (*)    GFR, Estimated 3 (*)    Anion gap 18 (*)    All other components within normal limits  HEPATIC FUNCTION PANEL - Abnormal; Notable for the following components:   Albumin 3.1 (*)    Bilirubin, Direct 0.3 (*)    All other components within normal limits  RESP PANEL BY RT-PCR (RSV, FLU A&B, COVID)  RVPGX2    EKG EKG Interpretation  Date/Time:  Sunday September 19 2022 07:07:00 EST Ventricular Rate:  84 PR Interval:  212 QRS Duration: 114 QT Interval:  414 QTC Calculation: 489 R Axis:   -45 Text Interpretation: Sinus rhythm with 1st degree A-V block Left axis deviation Incomplete right bundle branch block Minimal voltage criteria for LVH, may be normal variant ( Cornell product ) Septal infarct , age undetermined Abnormal ECG When compared with ECG of 12-Aug-2022 14:30, No significant change since last tracing Confirmed by Dorie Rank [phone]) on 09/19/2022 9:22:48 AM  Radiology DG Chest 2 View  Result Date: 09/19/2022 CLINICAL DATA:  Shortness of breath, missed dialysis EXAM: CHEST - 2 VIEW COMPARISON:  08/12/2022 FINDINGS: Cardiomegaly and vascular pedicle widening. Diffuse hazy appearance of the chest with new pleural fluid, some of which appears fissural and loculated. diffuse interstitial opacity. IMPRESSION: Pulmonary edema and pleural effusions  since prior. Some of the pleural fluid appears loculated. Electronically Signed   By: Jorje Guild M.D.   On: 09/19/2022 08:10    Procedures .Critical Care  Performed by: Dorie Rank, MD Authorized by: Dorie Rank, MD   Critical care provider statement:    Critical care time (minutes):  30   Critical care was time spent personally by me on the following activities:  Development of treatment plan with patient or surrogate, discussions with consultants, evaluation of patient's response to treatment, examination of patient, ordering and review of laboratory studies, ordering and review of radiographic studies, ordering and performing treatments and interventions, pulse oximetry, re-evaluation of patient's condition and review of old charts     Medications Ordered in ED Medications  sodium zirconium cyclosilicate (LOKELMA) packet 10 g (has no administration in time range)  insulin aspart (novoLOG) injection 5 Units (has no administration in time range)    And  dextrose 50 % solution 50 mL (has no administration in time range)  albuterol (PROVENTIL) (2.5 MG/3ML) 0.083% nebulizer solution 5 mg (has no administration in time range)    ED Course/ Medical Decision Making/ A&P Clinical Course as of 09/19/22 1012  Sun Sep 19, 2022  4967 Basic metabolic panel(!!) BUN and creatinine elevated consistent with chronic kidney disease.  Hyperkalemia noted [JK]  0919 DG Chest 2 View Chest x-ray shows pulmonary edema and pleural effusions [JK]  0919 CBC(!) Hemoglobin increased compared to previous [JK]  0919 Resp panel by RT-PCR (RSV, Flu A&B, Covid) Anterior Nasal Swab Covid flu negative [JK]  5916 D/w Dr Joelyn Oms, will plan on dialysis but may not be until the evening.  I will consult with hospitalist for admission [JK]  1011 Reviewed case with Dr. Lorin Mercy.  Patient may not require admission if he improves after his dialysis.  She will consult on the patient.  We will continue to monitor.  Patient may  end up  getting discharged after his dialysis but then may not be till later this evening [JK]    Clinical Course User Index [JK] Dorie Rank, MD                           Medical Decision Making Differential diagnosis includes but not limited to pulmonary edema, pneumonia, pneumothorax, complications associated with his chronic kidney disease  Problems Addressed: Acute pulmonary edema ([place]): acute illness or injury that poses a threat to life or bodily functions Hyperkalemia: acute illness or injury that poses a threat to life or bodily functions  Amount and/or Complexity of Data Reviewed External Data Reviewed:     Details: Reviewed records from previous hospitalization and ED visit Labs:  Decision-making details documented in ED Course. Radiology:  Decision-making details documented in ED Course.  Risk OTC drugs. Prescription drug management.   Patient presents to the ED for evaluation of shortness of breath.  Patient did miss dialysis on Friday.  Patient's ED workup today is suggestive of pulmonary edema most likely related to his missed dialysis.  No evidence of pneumonia.  He is negative for COVID and influenza.  Patient also is hyperkalemic.  No acute changes at this time but he is at risk for cardiac dysrhythmia from his hyperkalemia.  Lokelma ordered.  Insulin glucose and albuterol also ordered.  Discussed the case with Dr. Joelyn Oms nephrology, plan on dialysis.  I will consult with the medical service for admission.        Final Clinical Impression(s) / ED Diagnoses Final diagnoses:  Acute pulmonary edema ([place])  Hyperkalemia  Chronic renal failure, stage 5 ([place])    Rx / DC Orders ED Discharge Orders     None         Dorie Rank, MD 09/19/22 1012

## 2022-09-19 NOTE — ED Notes (Signed)
Upon entering room pt wasn't connected to nasal canula. Pt O2 77%. RN placed nasal canula on and 4L brought pt to 96%

## 2022-09-19 NOTE — ED Notes (Signed)
Pt O2 saturation sustaining at 87-89% after trial off of oxygen while only laying in bed. MD noted and pt placed back on [place] 2L of O2

## 2022-09-19 NOTE — Consult Note (Signed)
[facility] Renal Consultation Note    Indication for Consultation:  Management of ESRD/hemodialysis; anemia, hypertension/volume and secondary hyperparathyroidism   HPI: Travis [name] is a 82 y.o. male with ESRD on HD MWF at [facility]. He missed dialysis Friday d/t not feeling well. States he was coughing/sneezing and felt very weak. Family had to help him get into bed. This am woke with SOB and heavy feeling in his chest and presented to the ED. CXR showing pulmonary edema. Labs notable for K 6.7, BUN 90 Cr 15.40. Respiratory panel negative. Seen and examined in the ED. VSS. Comfortable on RA.  His last dialysis was Wednesday 1/3. Post wt 73.3kg. >3kg over his dry weight.   Past Medical History:  Diagnosis Date   Anemia    Arthritis    Chronic combined systolic and diastolic CHF (congestive heart failure) ([place])    a. Etiology of low EF not defined.   DVT (deep venous thrombosis) ([place])    a. 01/2015: RLE DVT. VQ scan intermediate probability. Underwent renal bx complicated by perinephric hematoma; anticoagulation stopped and IVC filter placed.   ESRD on hemodialysis ([place]) 02/2015   a. 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 Received plasma exchange and steroids but didn't respond and ended up starting hemodialysis June 2016.    Essential hypertension    GERD (gastroesophageal reflux disease)    History of nuclear stress test    Myoview 9/19 [facility]): low risk    HTN (hypertension)    Hypoalbuminemia    Hypoglycemia    NSTEMI (non-ST elevated myocardial infarction) ([place]) 04/18/2015   OA (osteoarthritis) of knee    Perinephric hematoma 01/2015   Proctitis 07/2015   Protein calorie malnutrition ([place])    Pulmonary fibrosis ([place])    Sjogren's disease ([place]) 01/28/2015   Past Surgical History:  Procedure Laterality Date   AV FISTULA PLACEMENT Right 02/17/2015   Procedure: INSERTION OF RIGHT ARM   ARTERIOVENOUS (AV) GORE-TEX GRAFT ;  Surgeon: Elam Dutch, MD;  Location: [place];  Service: Vascular;  Laterality: Right;   FLEXIBLE SIGMOIDOSCOPY N/A 08/04/2015   Procedure: FLEXIBLE SIGMOIDOSCOPY;  Surgeon: Wilford Corner, MD;  Location: [place];  Service: Endoscopy;  Laterality: N/A;   HEMORROIDECTOMY  1999   INSERTION OF DIALYSIS CATHETER N/A 02/17/2015   Procedure: INSERTION OF DIALYSIS CATHETER RIGHT INTERNAL JUGULAR VEIN;  Surgeon: Elam Dutch, MD;  Location: [place];  Service: Vascular;  Laterality: N/A;   MULTIPLE TOOTH EXTRACTIONS     PERIPHERAL VASCULAR BALLOON ANGIOPLASTY Right 01/23/2019   Procedure: PERIPHERAL VASCULAR BALLOON ANGIOPLASTY;  Surgeon: Elam Dutch, MD;  Location: [place] [facility];  Service: Cardiovascular;  Laterality: Right;   REVISION OF ARTERIOVENOUS GORETEX GRAFT Right 03/29/2017   Procedure: REVISION OF ARTERIOVENOUS GORETEX GRAFT;  Surgeon: Conrad Noel, MD;  Location: [place];  Service: Vascular;  Laterality: Right;   RIGHT/LEFT HEART CATH AND CORONARY ANGIOGRAPHY N/A 07/06/2022   Procedure: RIGHT/LEFT HEART CATH AND CORONARY ANGIOGRAPHY;  Surgeon: Leonie Man, MD;  Location: [place] [facility];  Service: Cardiovascular;  Laterality: N/A;   Surgical procedure to remove a mole as a child Right Eye area   At around 55 years old   Family History  Problem Relation Age of Onset   Hypertension Mother    Healthy Father    Hypertension Sister    Hypertension Brother    Social History:  reports that he has never smoked.  He has never used smokeless tobacco. He reports that he does not drink alcohol and does not use drugs. No Known Allergies Prior to Admission medications   Medication Sig Start Date End Date Taking? Authorizing Provider  Accu-Chek Softclix Lancets lancets Use to test blood glucose four times daily as directed. 05/26/21   Thurnell Lose, MD  acetaminophen (TYLENOL) 500 MG tablet Take 500 mg by mouth every 6 (six) hours as  needed for moderate pain.    [provider]  amLODipine (NORVASC) 10 MG tablet Take 1 tablet (10 mg total) by mouth daily. 08/05/22   Darliss Cheney, MD  aspirin EC 81 MG EC tablet Take 1 tablet (81 mg total) by mouth daily. 04/23/15   Reyne Dumas, MD  atorvastatin (LIPITOR) 40 MG tablet Take 1 tablet (40 mg total) by mouth daily at 6 PM. 04/23/15   Reyne Dumas, MD  atropine 1 % ophthalmic solution Place 1 drop into the right eye in the morning. 06/23/22   [provider]  Blood Glucose Monitoring Suppl (BLOOD GLUCOSE MONITOR SYSTEM) w/Device KIT Use up to four times daily as directed. 05/26/21   Thurnell Lose, MD  Brinzolamide-Brimonidine 1-0.2 % SUSP Place 1 drop into both eyes in the morning, at noon, and at bedtime. 11/03/21   [provider]  calcitRIOL (ROCALTROL) 0.25 MCG capsule Take 13 capsules (3.25 mcg total) by mouth every Monday, Wednesday, and Friday with hemodialysis. Patient not taking: Reported on 08/03/2022 12/25/21   Cherene Altes, MD  carvedilol (COREG) 12.5 MG tablet Take 1 tablet (12.5 mg total) by mouth 2 (two) times daily with a meal. 07/08/22   Domenic Polite, MD  cinacalcet [facility]) 30 MG tablet Take 3 tablets (90 mg total) by mouth every Monday, Wednesday, and Friday with hemodialysis. Patient not taking: Reported on 08/03/2022 12/25/21   Cherene Altes, MD  ferric gluconate 62.5 mg in sodium chloride 0.9 % 100 mL Inject 62.5 mg into the vein every Wednesday with hemodialysis. Patient not taking: Reported on 08/03/2022 12/30/21   Cherene Altes, MD  gabapentin (NEURONTIN) 300 MG capsule Take 300 mg by mouth daily.    [provider]  glucose blood test strip Use to test blood glucose four times daily as directed 05/26/21   Thurnell Lose, MD  latanoprost (XALATAN) 0.005 % ophthalmic solution Place 1 drop into both eyes at bedtime. 05/05/21   [provider]  methocarbamol (ROBAXIN) 500 MG tablet Take 500 mg by  mouth 3 (three) times daily as needed for muscle spasms.    [provider]  midodrine (PROAMATINE) 10 MG tablet Take 10 mg by mouth every Monday, Wednesday, and Friday. Take 10 mg (1 tablet) by mouth before dialysis and 10 mg (1 tablet) at dialysis as directed.    [provider]  nitroGLYCERIN (NITROSTAT) 0.4 MG SL tablet Place 1 tablet (0.4 mg total) under the tongue every 5 (five) minutes as needed for chest pain (for total of 3 doses at the most). 07/08/22   Domenic Polite, MD  omeprazole (PRILOSEC) 40 MG capsule Take 40 mg by mouth daily. 07/31/15   [provider]  prednisoLONE acetate (PRED FORTE) 1 % ophthalmic suspension Place 1 drop into the right eye 6 (six) times daily. 06/23/22   [provider]  sevelamer carbonate (RENVELA) 800 MG tablet Take 2 tablets (1,600 mg total) by mouth 3 (three) times daily with meals. Patient taking differently: Take 3,200 mg by mouth 3 (three) times daily  with meals. 05/17/15   Cherene Altes, MD  timolol (TIMOPTIC-XR) 0.5 % ophthalmic gel-forming Place 1 drop into both eyes daily. 11/03/21   [provider]   Current Facility-Administered Medications  Medication Dose Route Frequency Provider Last Rate Last Admin   albuterol (PROVENTIL) (2.5 MG/3ML) 0.083% nebulizer solution 5 mg  5 mg Nebulization Once Dorie Rank, MD       Chlorhexidine Gluconate Cloth 2 % PADS 6 each  6 each Topical Q0600 Lynnda Child, PA-C       insulin aspart (novoLOG) injection 5 Units  5 Units Intravenous Once Dorie Rank, MD       And   dextrose 50 % solution 50 mL  1 ampule Intravenous Once Dorie Rank, MD       sodium zirconium cyclosilicate (LOKELMA) packet 10 g  10 g Oral Once Dorie Rank, MD       Current Outpatient Medications  Medication Sig Dispense Refill   Accu-Chek Softclix Lancets lancets Use to test blood glucose four times daily as directed. 100 each 1   acetaminophen (TYLENOL) 500 MG tablet Take 500 mg by mouth  every 6 (six) hours as needed for moderate pain.     amLODipine (NORVASC) 10 MG tablet Take 1 tablet (10 mg total) by mouth daily. 30 tablet 0   aspirin EC 81 MG EC tablet Take 1 tablet (81 mg total) by mouth daily. 30 tablet 0   atorvastatin (LIPITOR) 40 MG tablet Take 1 tablet (40 mg total) by mouth daily at 6 PM. 30 tablet 0   atropine 1 % ophthalmic solution Place 1 drop into the right eye in the morning.     Blood Glucose Monitoring Suppl (BLOOD GLUCOSE MONITOR SYSTEM) w/Device KIT Use up to four times daily as directed. 1 kit 1   Brinzolamide-Brimonidine 1-0.2 % SUSP Place 1 drop into both eyes in the morning, at noon, and at bedtime.     calcitRIOL (ROCALTROL) 0.25 MCG capsule Take 13 capsules (3.25 mcg total) by mouth every Monday, Wednesday, and Friday with hemodialysis. (Patient not taking: Reported on 08/03/2022)     carvedilol (COREG) 12.5 MG tablet Take 1 tablet (12.5 mg total) by mouth 2 (two) times daily with a meal. 60 tablet 0   cinacalcet (SENSIPAR) 30 MG tablet Take 3 tablets (90 mg total) by mouth every Monday, Wednesday, and Friday with hemodialysis. (Patient not taking: Reported on 08/03/2022) 60 tablet    ferric gluconate 62.5 mg in sodium chloride 0.9 % 100 mL Inject 62.5 mg into the vein every Wednesday with hemodialysis. (Patient not taking: Reported on 08/03/2022)     gabapentin (NEURONTIN) 300 MG capsule Take 300 mg by mouth daily.     glucose blood test strip Use to test blood glucose four times daily as directed 100 each 1   latanoprost (XALATAN) 0.005 % ophthalmic solution Place 1 drop into both eyes at bedtime.     methocarbamol (ROBAXIN) 500 MG tablet Take 500 mg by mouth 3 (three) times daily as needed for muscle spasms.     midodrine (PROAMATINE) 10 MG tablet Take 10 mg by mouth every Monday, Wednesday, and Friday. Take 10 mg (1 tablet) by mouth before dialysis and 10 mg (1 tablet) at dialysis as directed.     nitroGLYCERIN (NITROSTAT) 0.4 MG SL tablet Place 1  tablet (0.4 mg total) under the tongue every 5 (five) minutes as needed for chest pain (for total of 3 doses at the most). 10 tablet 1  omeprazole (PRILOSEC) 40 MG capsule Take 40 mg by mouth daily.  11   prednisoLONE acetate (PRED FORTE) 1 % ophthalmic suspension Place 1 drop into the right eye 6 (six) times daily.     sevelamer carbonate (RENVELA) 800 MG tablet Take 2 tablets (1,600 mg total) by mouth 3 (three) times daily with meals. (Patient taking differently: Take 3,200 mg by mouth 3 (three) times daily with meals.) 90 tablet 0   timolol (TIMOPTIC-XR) 0.5 % ophthalmic gel-forming Place 1 drop into both eyes daily.       ROS: As per HPI otherwise negative.  Physical Exam: Vitals:   09/19/22 0641 09/19/22 0646 09/19/22 0930 09/19/22 1000  BP: (!) 160/85   (!) 176/86  Pulse: 80  93   Resp: 14     Temp:  98 F (36.7 C)    TempSrc:  Oral    SpO2: 100%  98%      General: Elderly man, not in acute distress  Head: NCAT sclera not icteric  Neck: Supple. Trachea midline Lungs: Clear bilaterally, normal work of breathing Heart: RRR, systolic murmur, no rub Abdomen: soft non-tender, bowel sounds normal  Lower extremities:without edema or ischemic changes, no open wounds  Neuro: A & O X 3. Moves all extremities spontaneously. Psych:  Responds to questions appropriately with a normal affect. Dialysis Access: RUE AVG +bruit   Labs: Basic Metabolic Panel: Recent Labs  Lab 09/19/22 0651  NA 140  K 6.7*  CL 97*  CO2 25  GLUCOSE 101*  BUN 90*  CREATININE 15.40*  CALCIUM 9.2   Liver Function Tests: Recent Labs  Lab 09/19/22 0651  AST 19  ALT 5  ALKPHOS 76  BILITOT 0.8  PROT 7.7  ALBUMIN 3.1*   No results for input(s): "LIPASE", "AMYLASE" in the last 168 hours. No results for input(s): "AMMONIA" in the last 168 hours. CBC: Recent Labs  Lab 09/19/22 0651  WBC 9.7  HGB 11.6*  HCT 38.4*  MCV 94.6  PLT 228   Cardiac Enzymes: No results for input(s): "CKTOTAL",  "CKMB", "CKMBINDEX", "TROPONINI" in the last 168 hours. CBG: No results for input(s): "GLUCAP" in the last 168 hours. Iron Studies: No results for input(s): "IRON", "TIBC", "TRANSFERRIN", "FERRITIN" in the last 72 hours. Studies/Results: DG Chest 2 View  Result Date: 09/19/2022 CLINICAL DATA:  Shortness of breath, missed dialysis EXAM: CHEST - 2 VIEW COMPARISON:  08/12/2022 FINDINGS: Cardiomegaly and vascular pedicle widening. Diffuse hazy appearance of the chest with new pleural fluid, some of which appears fissural and loculated. diffuse interstitial opacity. IMPRESSION: Pulmonary edema and pleural effusions since prior. Some of the pleural fluid appears loculated. Electronically Signed   By: Jorje Guild M.D.   On: 09/19/2022 08:10    Dialysis Orders:  Unit: [facility] MWF  Time: 3:30  EDW: 70 kg Flows: 400/A1.5x Bath: 2K/2CA Access: AVG Heparin: None  ESA: None Calcitriol 2.75 TIW Sensipar 90 TIW   Assessment/Plan: ESRD - HD MWF. Missed Friday. Now with pulm edema/hyperkalemia. Plan for urgent HD today. Dialysis RN has been notified. HD back on schedule tomorrow.  Pulm edema/Volume overload - secondary to missed dialysis. HD today to get volume down 3L UF goal with dialysis today  Hyperkalemia - secondary to missed dialysis. Correct with HD.  Hypertension- BP elevated -should improve with UF Anemia  - Hb above goal  Metabolic bone disease -  Continue home meds if admitted  Nutrition - Renal diet with fluid restriction   Lynnda Child PA-C [place]  [facility] 09/19/2022, 10:44 AM

## 2022-09-19 NOTE — H&P (Incomplete)
History and Physical    Travis [name] OYD:[id number] DOB: 09-Aug-1941 DOA: 09/19/2022  PCP: [name], MD  Patient coming from: home  I have personally briefly reviewed patient's old medical records in [place]  Chief Complaint: sob  HPI: Travis [name] is a 82 y.o. male with medical history significant of  hronic combined CHF; ESRD on HD; HTN; CAD; pulmonary fibrosis (chronic O2 dependence); and Sjogren's disease presenting with SOB who presents to ED after missing last HD session 2day ago on MWF.  He states he did not feel well and was not able to make it to HD.He patient he had chest discomfort and significant fatigue. However since then he has had increase sob and due to this presented to ED. Patient in ED was note to be overload with cxr not pulmonary edema. Nephrology was consulted and patient underwent ED with plans for discharge home s/p HD. However in ED on attempt to wean O2 patient at rest was noted to be 88% due to this patient was slated for admission for further renal care and evaluation of continued hypoxemia despite recent HD.  ED Course:  K+ 6.7, has pulm edema. COVID/flu negative. continued O2 requirement. 2L Repeat cxr pending  Prior cxr Pulmonary edema and pleural effusions since prior. Some of the pleural fluid appears loculated.   Review of Systems: As per HPI otherwise 10 point review of systems negative.   Past Medical History:  Diagnosis Date   Anemia    Arthritis    Chronic combined systolic and diastolic CHF (congestive heart failure) ([place])    a. Etiology of low EF not defined.   DVT (deep venous thrombosis) ([place])    a. 01/2015: RLE DVT. VQ scan intermediate probability. Underwent renal bx complicated by perinephric hematoma; anticoagulation stopped and IVC filter placed.   ESRD on hemodialysis ([place]) 02/2015   a. 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 Received plasma exchange and steroids but didn't  respond and ended up starting hemodialysis June 2016.    Essential hypertension    GERD (gastroesophageal reflux disease)    History of nuclear stress test    Myoview 9/19 [facility]): low risk    HTN (hypertension)    Hypoalbuminemia    Hypoglycemia    NSTEMI (non-ST elevated myocardial infarction) ([place]) 04/18/2015   OA (osteoarthritis) of knee    Perinephric hematoma 01/2015   Proctitis 07/2015   Protein calorie malnutrition ([place])    Pulmonary fibrosis ([place])    Sjogren's disease ([place]) 01/28/2015    Past Surgical History:  Procedure Laterality Date   AV FISTULA PLACEMENT Right 02/17/2015   Procedure: INSERTION OF RIGHT ARM  ARTERIOVENOUS (AV) GORE-TEX GRAFT ;  Surgeon: Elam Dutch, MD;  Location: [place];  Service: Vascular;  Laterality: Right;   FLEXIBLE SIGMOIDOSCOPY N/A 08/04/2015   Procedure: FLEXIBLE SIGMOIDOSCOPY;  Surgeon: Wilford Corner, MD;  Location: [place];  Service: Endoscopy;  Laterality: N/A;   HEMORROIDECTOMY  1999   INSERTION OF DIALYSIS CATHETER N/A 02/17/2015   Procedure: INSERTION OF DIALYSIS CATHETER RIGHT INTERNAL JUGULAR VEIN;  Surgeon: Elam Dutch, MD;  Location: [place];  Service: Vascular;  Laterality: N/A;   MULTIPLE TOOTH EXTRACTIONS     PERIPHERAL VASCULAR BALLOON ANGIOPLASTY Right 01/23/2019   Procedure: PERIPHERAL VASCULAR BALLOON ANGIOPLASTY;  Surgeon: Elam Dutch, MD;  Location: [place] [facility];  Service: Cardiovascular;  Laterality: Right;   REVISION OF ARTERIOVENOUS GORETEX GRAFT Right 03/29/2017  Procedure: REVISION OF ARTERIOVENOUS GORETEX GRAFT;  Surgeon: Conrad [place], MD;  Location: [facility];  Service: Vascular;  Laterality: Right;   RIGHT/LEFT HEART CATH AND CORONARY ANGIOGRAPHY N/A 07/06/2022   Procedure: RIGHT/LEFT HEART CATH AND CORONARY ANGIOGRAPHY;  Surgeon: Leonie Man, MD;  Location: [place] [facility];  Service: Cardiovascular;  Laterality: N/A;   Surgical procedure to remove a mole as a child Right Eye area    At around 21 years old     reports that he has never smoked. He has never used smokeless tobacco. He reports that he does not drink alcohol and does not use drugs.  No Known Allergies  Family History  Problem Relation Age of Onset   Hypertension Mother    Healthy Father    Hypertension Sister    Hypertension Brother     Prior to Admission medications   Medication Sig Start Date End Date Taking? Authorizing Provider  acetaminophen (TYLENOL) 500 MG tablet Take 500 mg by mouth every 6 (six) hours as needed for moderate pain.   Yes [provider]  albuterol (VENTOLIN HFA) 108 (90 Base) MCG/ACT inhaler Inhale 2 puffs into the lungs every 6 (six) hours as needed for wheezing or shortness of breath. 08/19/22  Yes [provider]  amLODipine (NORVASC) 10 MG tablet Take 1 tablet (10 mg total) by mouth daily. 08/05/22  Yes Darliss Cheney, MD  aspirin EC 81 MG EC tablet Take 1 tablet (81 mg total) by mouth daily. 04/23/15  Yes Reyne Dumas, MD  Brinzolamide-Brimonidine 1-0.2 % SUSP Place 1 drop into both eyes in the morning, at noon, and at bedtime. 11/03/21  Yes [provider]  calcitRIOL (ROCALTROL) 0.25 MCG capsule Take 13 capsules (3.25 mcg total) by mouth every Monday, Wednesday, and Friday with hemodialysis. 12/25/21  Yes Cherene Altes, MD  gabapentin (NEURONTIN) 300 MG capsule Take 300 mg by mouth daily.   Yes [provider]  latanoprost (XALATAN) 0.005 % ophthalmic solution Place 1 drop into both eyes at bedtime. 05/05/21  Yes [provider]  midodrine (PROAMATINE) 10 MG tablet Take 10 mg by mouth every Monday, Wednesday, and Friday. Take 10 mg (1 tablet) by mouth before dialysis and 10 mg (1 tablet) at dialysis as directed.   Yes [provider]  nitroGLYCERIN (NITROSTAT) 0.4 MG SL tablet Place 1 tablet (0.4 mg total) under the tongue every 5 (five) minutes as needed for chest pain (for total of 3 doses at the most). 07/08/22  Yes  Domenic Polite, MD  omeprazole (PRILOSEC) 40 MG capsule Take 40 mg by mouth daily. 07/31/15  Yes [provider]  sevelamer carbonate (RENVELA) 800 MG tablet Take 2 tablets (1,600 mg total) by mouth 3 (three) times daily with meals. 05/17/15  Yes Cherene Altes, MD  timolol (TIMOPTIC-XR) 0.5 % ophthalmic gel-forming Place 1 drop into both eyes daily. 11/03/21  Yes [provider]  Accu-Chek Softclix Lancets lancets Use to test blood glucose four times daily as directed. 05/26/21   Thurnell Lose, MD  atorvastatin (LIPITOR) 40 MG tablet Take 1 tablet (40 mg total) by mouth daily at 6 PM. Patient not taking: Reported on 09/19/2022 04/23/15   Reyne Dumas, MD  Blood Glucose Monitoring Suppl (BLOOD GLUCOSE MONITOR SYSTEM) w/Device KIT Use up to four times daily as directed. 05/26/21   Thurnell Lose, MD  carvedilol (COREG) 12.5 MG tablet Take 1 tablet (12.5 mg total) by mouth 2 (two) times daily with a meal.  Patient not taking: Reported on 09/19/2022 07/08/22   Domenic Polite, MD  cinacalcet [facility]) 30 MG tablet Take 3 tablets (90 mg total) by mouth every Monday, Wednesday, and Friday with hemodialysis. 12/25/21   Cherene Altes, MD  ferric gluconate 62.5 mg in sodium chloride 0.9 % 100 mL Inject 62.5 mg into the vein every Wednesday with hemodialysis. 12/30/21   Cherene Altes, MD  glucose blood test strip Use to test blood glucose four times daily as directed 05/26/21   Thurnell Lose, MD  methocarbamol (ROBAXIN) 500 MG tablet Take 500 mg by mouth 3 (three) times daily as needed for muscle spasms. Patient not taking: Reported on 09/19/2022    [provider]    Physical Exam: Vitals:   09/19/22 1930 09/19/22 1945 09/19/22 2000 09/19/22 2030  BP: (!) 168/77 (!) 167/74 (!) 164/79 (!) 173/74  Pulse: 78 75 78 82  Resp: 17 (!) 23 (!) 24 19  Temp:      TempSrc:      SpO2: 96% 100% 99% 100%  Weight:        Constitutional: NAD, calm, comfortable Vitals:    09/19/22 1930 09/19/22 1945 09/19/22 2000 09/19/22 2030  BP: (!) 168/77 (!) 167/74 (!) 164/79 (!) 173/74  Pulse: 78 75 78 82  Resp: 17 (!) 23 (!) 24 19  Temp:      TempSrc:      SpO2: 96% 100% 99% 100%  Weight:       Eyes: PERRL, lids and conjunctivae normal ENMT: Mucous membranes are moist. Posterior pharynx clear of any exudate or lesions.Normal dentition.  Neck: normal, supple, no masses, no thyromegaly Respiratory: clear to auscultation bilaterally, no wheezing, no crackles. Normal respiratory effort. No accessory muscle use.  Cardiovascular: Regular rate and rhythm, no murmurs / rubs / gallops. No extremity edema. 2+ pedal pulses. No carotid bruits.  Abdomen: no tenderness, no masses palpated. No hepatosplenomegaly. Bowel sounds positive.  Musculoskeletal: no clubbing / cyanosis. No joint deformity upper and lower extremities. Good ROM, no contractures. Normal muscle tone.  Skin: no rashes, lesions, ulcers. No induration Neurologic: CN 2-12 grossly intact. Sensation intact, DTR normal. Strength 5/5 in all 4.  Psychiatric: Normal judgment and insight. Alert and oriented x 3. Normal mood.    Labs on Admission: I have personally reviewed following labs and imaging studies  CBC: Recent Labs  Lab 09/19/22 0651  WBC 9.7  HGB 11.6*  HCT 38.4*  MCV 94.6  PLT 409   Basic Metabolic Panel: Recent Labs  Lab 09/19/22 0651 09/19/22 1013  NA 140 137  K 6.7* 5.9*  CL 97* 97*  CO2 25 23  GLUCOSE 101* 94  BUN 90* 91*  CREATININE 15.40* 15.54*  CALCIUM 9.2 8.8*   GFR: Estimated Creatinine Clearance: 3.9 mL/min (A) (by C-G formula based on SCr of 15.54 mg/dL (H)). Liver Function Tests: Recent Labs  Lab 09/19/22 0651  AST 19  ALT 5  ALKPHOS 76  BILITOT 0.8  PROT 7.7  ALBUMIN 3.1*   No results for input(s): "LIPASE", "AMYLASE" in the last 168 hours. No results for input(s): "AMMONIA" in the last 168 hours. Coagulation Profile: No results for input(s): "INR", "PROTIME" in  the last 168 hours. Cardiac Enzymes: No results for input(s): "CKTOTAL", "CKMB", "CKMBINDEX", "TROPONINI" in the last 168 hours. BNP (last 3 results) No results for input(s): "PROBNP" in the last 8760 hours. HbA1C: No results for input(s): "HGBA1C" in the last 72 hours. CBG: Recent Labs  Lab 09/19/22  [place]   Lipid Profile: No results for input(s): "CHOL", "HDL", "LDLCALC", "TRIG", "CHOLHDL", "LDLDIRECT" in the last 72 hours. Thyroid Function Tests: No results for input(s): "TSH", "T4TOTAL", "FREET4", "T3FREE", "THYROIDAB" in the last 72 hours. Anemia Panel: No results for input(s): "VITAMINB12", "FOLATE", "FERRITIN", "TIBC", "IRON", "RETICCTPCT" in the last 72 hours. Urine analysis:    Component Value Date/Time   COLORURINE AMBER (A) 01/29/2015 2200   APPEARANCEUR CLOUDY (A) 01/29/2015 2200   LABSPEC 1.013 01/29/2015 2200   PHURINE 5.0 01/29/2015 2200   GLUCOSEU NEGATIVE 01/29/2015 2200   HGBUR LARGE (A) 01/29/2015 2200   BILIRUBINUR NEGATIVE 01/29/2015 2200   KETONESUR NEGATIVE 01/29/2015 2200   PROTEINUR 100 (A) 01/29/2015 2200   UROBILINOGEN 0.2 01/29/2015 2200   NITRITE NEGATIVE 01/29/2015 2200   LEUKOCYTESUR NEGATIVE 01/29/2015 2200    Radiological Exams on Admission: DG Chest 2 View  Result Date: 09/19/2022 CLINICAL DATA:  Shortness of breath, missed dialysis EXAM: CHEST - 2 VIEW COMPARISON:  08/12/2022 FINDINGS: Cardiomegaly and vascular pedicle widening. Diffuse hazy appearance of the chest with new pleural fluid, some of which appears fissural and loculated. diffuse interstitial opacity. IMPRESSION: Pulmonary edema and pleural effusions since prior. Some of the pleural fluid appears loculated. Electronically Signed   By: Jorje Guild M.D.   On: 09/19/2022 08:10    EKG: Independently reviewed.   Assessment/Plan   Fluid overload due to ESRD with associated hypoxic respiratory failure -patient has history of chronic O2 use on chart but per patient he  does not wear O2 at home -admit to progressive care  - renal consult for further evaluation for repeat HD -repeat imaging CT to r/o other etiology of hypoxemia in light of ? Loculated effusion -home O2 evaluation  to clarify patient needs.    Chronic combined CHF  -Volume controlled with HD    HTN - hx of hypotension, hold bp medications currently  -Continue midodrine on HD days    CAD -Continue ASA, Lipitor -check ce to be complete  -Chronic O2 dependence -prn Albuterol appears to be his only medication for this issue    [place] -Continue Brinzolamide-Brimonidine, latanoprost, timolol       DVT prophylaxis: heparin Code Status: full/ as discussed per patient wishes in event of cardiac arrest  Family Communication: family at bedside Disposition Plan: patient  expected to be admitted less than 2 midnights  Consults called: renal Ejigiri MD Admission status: progresive care   Clance Boll MD [facility] Pager [phone]  If 7PM-7AM, please contact night-coverage [email or web address] Password [facility]  09/19/2022, 8:57 PM

## 2022-09-19 NOTE — Progress Notes (Addendum)
Received patient in E.D bed,with oxygen @ 4 lpm/[place].Awake,alert and oriented x 4. Denies pain.Vitals stable.  Access used : Right lower arm AVG ++,two big aneurism noted.  Duration of treatment : 3.5 hours  Medicine given: None.  Cartridge changed: No  Fluid removed : Met UF  prescribed of 3 liters.  Hemodialysis treatment issue: None.  Hands off to the patient's nurse.       As per E.D CN,just bring back to patient to ED charge area.

## 2022-09-19 NOTE — Consult Note (Signed)
ER Consult    Patient: Travis [name] TFT:[id number] DOB: [birth year] DOA: 09/19/2022 DOS: the patient was seen and examined on 09/19/2022 PCP: [name], MD  Patient coming from: Home - lives with wife; NOK: Wife, Skanda Worlds, [phone]   Chief Complaint: SOB  HPI: Travis [name] is a 82 y.o. male with medical history significant of chronic combined CHF; ESRD on HD; HTN; CAD; pulmonary fibrosis (chronic O2 dependence); and Sjogren's disease presenting with SOB.  He reports that he wasn't feeling well on Friday - some chest heaviness and generalized malaise - and so missed HD.  He became increasingly SOB and so came to the ER.  He is currently in HD and is still on [place] O2 but starting to feel better.    ER Course:  ESRD, missed HD - just needs HD.  SOB, didn't go Friday.  K+ 6.7, has pulm edema.  COVID/flu negative.  No O2 requirement.     Review of Systems: As mentioned in the history of present illness. All other systems reviewed and are negative. Past Medical History:  Diagnosis Date   Anemia    Arthritis    Chronic combined systolic and diastolic CHF (congestive heart failure) ([place])    a. Etiology of low EF not defined.   DVT (deep venous thrombosis) ([place])    a. 01/2015: RLE DVT. VQ scan intermediate probability. Underwent renal bx complicated by perinephric hematoma; anticoagulation stopped and IVC filter placed.   ESRD on hemodialysis ([place]) 02/2015   a. 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 Received plasma exchange and steroids but didn't respond and ended up starting hemodialysis June 2016.    Essential hypertension    GERD (gastroesophageal reflux disease)    History of nuclear stress test    Myoview 9/19 [facility]): low risk    HTN (hypertension)    Hypoalbuminemia    Hypoglycemia    NSTEMI (non-ST elevated myocardial infarction) ([place]) 04/18/2015   OA (osteoarthritis) of knee    Perinephric hematoma 01/2015    Proctitis 07/2015   Protein calorie malnutrition ([place])    Pulmonary fibrosis ([place])    Sjogren's disease ([place]) 01/28/2015   Past Surgical History:  Procedure Laterality Date   AV FISTULA PLACEMENT Right 02/17/2015   Procedure: INSERTION OF RIGHT ARM  ARTERIOVENOUS (AV) GORE-TEX GRAFT ;  Surgeon: Elam Dutch, MD;  Location: [place];  Service: Vascular;  Laterality: Right;   FLEXIBLE SIGMOIDOSCOPY N/A 08/04/2015   Procedure: FLEXIBLE SIGMOIDOSCOPY;  Surgeon: Wilford Corner, MD;  Location: [place];  Service: Endoscopy;  Laterality: N/A;   HEMORROIDECTOMY  1999   INSERTION OF DIALYSIS CATHETER N/A 02/17/2015   Procedure: INSERTION OF DIALYSIS CATHETER RIGHT INTERNAL JUGULAR VEIN;  Surgeon: Elam Dutch, MD;  Location: [place];  Service: Vascular;  Laterality: N/A;   MULTIPLE TOOTH EXTRACTIONS     PERIPHERAL VASCULAR BALLOON ANGIOPLASTY Right 01/23/2019   Procedure: PERIPHERAL VASCULAR BALLOON ANGIOPLASTY;  Surgeon: Elam Dutch, MD;  Location: [place];  Service: Cardiovascular;  Laterality: Right;   REVISION OF ARTERIOVENOUS GORETEX GRAFT Right 03/29/2017   Procedure: REVISION OF ARTERIOVENOUS GORETEX GRAFT;  Surgeon: Conrad [place], MD;  Location: [place];  Service: Vascular;  Laterality: Right;   RIGHT/LEFT HEART CATH AND CORONARY ANGIOGRAPHY N/A 07/06/2022   Procedure: RIGHT/LEFT HEART CATH AND CORONARY ANGIOGRAPHY;  Surgeon: Leonie Man, MD;  Location: [place] [facility];  Service: Cardiovascular;  Laterality: N/A;   Surgical procedure  to remove a mole as a child Right Eye area   At around 13 years old   Social History:  reports that he has never smoked. He has never used smokeless tobacco. He reports that he does not drink alcohol and does not use drugs.  No Known Allergies  Family History  Problem Relation Age of Onset   Hypertension Mother    Healthy Father    Hypertension Sister    Hypertension Brother     Prior to Admission medications   Medication  Sig Start Date End Date Taking? Authorizing Provider  Accu-Chek Softclix Lancets lancets Use to test blood glucose four times daily as directed. 05/26/21   Thurnell Lose, MD  acetaminophen (TYLENOL) 500 MG tablet Take 500 mg by mouth every 6 (six) hours as needed for moderate pain.    [provider]  amLODipine (NORVASC) 10 MG tablet Take 1 tablet (10 mg total) by mouth daily. 08/05/22   Darliss Cheney, MD  aspirin EC 81 MG EC tablet Take 1 tablet (81 mg total) by mouth daily. 04/23/15   Reyne Dumas, MD  atorvastatin (LIPITOR) 40 MG tablet Take 1 tablet (40 mg total) by mouth daily at 6 PM. 04/23/15   Reyne Dumas, MD  atropine 1 % ophthalmic solution Place 1 drop into the right eye in the morning. 06/23/22   [provider]  Blood Glucose Monitoring Suppl (BLOOD GLUCOSE MONITOR SYSTEM) w/Device KIT Use up to four times daily as directed. 05/26/21   Thurnell Lose, MD  Brinzolamide-Brimonidine 1-0.2 % SUSP Place 1 drop into both eyes in the morning, at noon, and at bedtime. 11/03/21   [provider]  calcitRIOL (ROCALTROL) 0.25 MCG capsule Take 13 capsules (3.25 mcg total) by mouth every Monday, Wednesday, and Friday with hemodialysis. Patient not taking: Reported on 08/03/2022 12/25/21   Cherene Altes, MD  carvedilol (COREG) 12.5 MG tablet Take 1 tablet (12.5 mg total) by mouth 2 (two) times daily with a meal. 07/08/22   Domenic Polite, MD  cinacalcet [facility]) 30 MG tablet Take 3 tablets (90 mg total) by mouth every Monday, Wednesday, and Friday with hemodialysis. Patient not taking: Reported on 08/03/2022 12/25/21   Cherene Altes, MD  ferric gluconate 62.5 mg in sodium chloride 0.9 % 100 mL Inject 62.5 mg into the vein every Wednesday with hemodialysis. Patient not taking: Reported on 08/03/2022 12/30/21   Cherene Altes, MD  gabapentin (NEURONTIN) 300 MG capsule Take 300 mg by mouth daily.    [provider]  glucose blood test strip Use to  test blood glucose four times daily as directed 05/26/21   Thurnell Lose, MD  latanoprost (XALATAN) 0.005 % ophthalmic solution Place 1 drop into both eyes at bedtime. 05/05/21   [provider]  methocarbamol (ROBAXIN) 500 MG tablet Take 500 mg by mouth 3 (three) times daily as needed for muscle spasms.    [provider]  midodrine (PROAMATINE) 10 MG tablet Take 10 mg by mouth every Monday, Wednesday, and Friday. Take 10 mg (1 tablet) by mouth before dialysis and 10 mg (1 tablet) at dialysis as directed.    [provider]  nitroGLYCERIN (NITROSTAT) 0.4 MG SL tablet Place 1 tablet (0.4 mg total) under the tongue every 5 (five) minutes as needed for chest pain (for total of 3 doses at the most). 07/08/22   Domenic Polite, MD  omeprazole (PRILOSEC) 40 MG capsule Take 40 mg by mouth daily. 07/31/15   [provider]  prednisoLONE acetate (PRED FORTE) 1 % ophthalmic suspension Place 1 drop into the right eye 6 (six) times daily. 06/23/22   [provider]  sevelamer carbonate (RENVELA) 800 MG tablet Take 2 tablets (1,600 mg total) by mouth 3 (three) times daily with meals. Patient taking differently: Take 3,200 mg by mouth 3 (three) times daily with meals. 05/17/15   Cherene Altes, MD  timolol (TIMOPTIC-XR) 0.5 % ophthalmic gel-forming Place 1 drop into both eyes daily. 11/03/21   [provider]    Physical Exam: Vitals:   09/19/22 1200 09/19/22 1205 09/19/22 1235 09/19/22 1329  BP: (!) 176/86  (!) 181/89 (!) 178/84  Pulse: 74  75 74  Resp: (!) 23  19 19   Temp:  98 F (36.7 C) 98.5 F (36.9 C)   TempSrc:  Oral    SpO2: 99%   96%  Weight:   75.6 kg    General:  Appears calm and comfortable and is in NAD Eyes:   EOMI, normal lids, iris ENT:  grossly normal hearing, lips & tongue, mmm Neck:  no LAD, masses or thyromegaly Cardiovascular:  RRR, no m/r/g. No LE edema.  Respiratory:   CTA bilaterally with no wheezes/rales/rhonchi.   Normal respiratory effort on [place] O2. Abdomen:  soft, NT, ND Skin:  no rash or induration seen on limited exam Musculoskeletal:  grossly normal tone BUE/BLE, good ROM, no bony abnormality Psychiatric:  blunted mood and affect, speech fluent and appropriate, AOx3 Neurologic:  CN 2-12 grossly intact, moves all extremities in coordinated fashion   Radiological Exams on Admission: Independently reviewed - see discussion in A/P where applicable  DG Chest 2 View  Result Date: 09/19/2022 CLINICAL DATA:  Shortness of breath, missed dialysis EXAM: CHEST - 2 VIEW COMPARISON:  08/12/2022 FINDINGS: Cardiomegaly and vascular pedicle widening. Diffuse hazy appearance of the chest with new pleural fluid, some of which appears fissural and loculated. diffuse interstitial opacity. IMPRESSION: Pulmonary edema and pleural effusions since prior. Some of the pleural fluid appears loculated. Electronically Signed   By: Jorje Guild M.D.   On: 09/19/2022 08:10    EKG: Independently reviewed.  NSR with rate 84; incomplete RBBB with no evidence of acute ischemia   Labs on Admission: I have personally reviewed the available labs and imaging studies at the time of the admission.  Pertinent labs:    K+ 6.7 BUN 90/Creatinine 1.40/GFR 3 Anion gap 18 Albumin 3.1 Unremarkable CBC COVID/flu/RSV negative   Assessment and Plan: Principal Problem:   Hypervolemia associated with renal insufficiency Active Problems:   HTN (hypertension)   ESRD on dialysis (HCC)   Chronic combined systolic (congestive) and diastolic (congestive) heart failure (HCC)   Hyperkalemia   Glaucoma    Volume overload in an ESRD on HD patient -Patient is on MWF HD and missed his Friday session -He presented today with pulmonary edema and hyperkalemia -These issues are anticipated to resolve with HD -He is scheduled for outpatient HD tomorrow AM, as well, and I have encouraged both the patient and his daughter to plan to attend -He  does not appear to need hospitalization at this time -May dc to home post-HD -Resume calcitriol, Renvela, Sensipar  Chronic combined CHF -Volume controlled with HD  HTN -Consider whether amlodipine is still needed as an outpatient -Continue midodrine on HD days  CAD -Continue ASA, Lipitor  Pulmonary fibrosis -Chronic O2 dependence -prn Albuterol appears to be his only medication for this issue  Glaucoma -Continue Brinzolamide-Brimonidine, latanoprost,  timolol     Thank you for this interesting consult.  Based on anticipated improvement with HD, patient is expected to discharge to home post-HD.  Will not plan to admit at this time.  Author: Karmen Bongo, MD 09/19/2022 1:56 PM  For on call review [email or web address].

## 2022-09-19 NOTE — ED Notes (Signed)
Pt states he is suppose to wear O2 when getting HD. Pt states he has not been wearing it and will wear it going forward during tx.

## 2022-09-19 NOTE — ED Triage Notes (Signed)
Patient arrived with EMS from home reports SOB onset yesterday with chest congestion , he missed his hemodialysis treatment last Friday , denies fever or chills .

## 2022-09-19 NOTE — ED Notes (Signed)
RN assisted pt to restroom with wheelchair. Pt states he uses a walker at home

## 2022-09-19 NOTE — ED Provider Notes (Signed)
Patient related to after he came back from dialysis.  He feels better but his sats are around 88%.  Placed on 2 L.  Unfortunately I think he will need to stay and get repeat dialysis tomorrow. Discussed with Dr. Marcello Moores. There is some question of whether or not he is on oxygen at home but when I discussed with the patient himself he states that he is not normally on oxygen though it sounds like he is supposed to be on it during dialysis itself.   Sherwood Gambler, MD 09/19/22 2201

## 2022-09-20 ENCOUNTER — Other Ambulatory Visit: Payer: Self-pay

## 2022-09-20 ENCOUNTER — Inpatient Hospital Stay (HOSPITAL_COMMUNITY): Payer: No Typology Code available for payment source

## 2022-09-20 DIAGNOSIS — M7989 Other specified soft tissue disorders: Secondary | ICD-10-CM

## 2022-09-20 DIAGNOSIS — R609 Edema, unspecified: Secondary | ICD-10-CM

## 2022-09-20 LAB — RESPIRATORY PANEL BY PCR

## 2022-09-20 LAB — COMPREHENSIVE METABOLIC PANEL
ALT: 7 U/L (ref 0–44)
AST: 16 U/L (ref 15–41)
Albumin: 2.9 g/dL — ABNORMAL LOW (ref 3.5–5.0)
Alkaline Phosphatase: 67 U/L (ref 38–126)
Anion gap: 16 — ABNORMAL HIGH (ref 5–15)
BUN: 55 mg/dL — ABNORMAL HIGH (ref 8–23)
CO2: 25 mmol/L (ref 22–32)
Calcium: 8.9 mg/dL (ref 8.9–10.3)
Chloride: 94 mmol/L — ABNORMAL LOW (ref 98–111)
Creatinine, Ser: 11.02 mg/dL — ABNORMAL HIGH (ref 0.61–1.24)
GFR, Estimated: 4 mL/min — ABNORMAL LOW (ref >60)
Glucose, Bld: 81 mg/dL (ref 70–99)
Potassium: 5.3 mmol/L — ABNORMAL HIGH (ref 3.5–5.1)
Sodium: 135 mmol/L (ref 135–145)
Total Bilirubin: 0.8 mg/dL (ref 0.3–1.2)
Total Protein: 7.4 g/dL (ref 6.5–8.1)

## 2022-09-20 LAB — CBC
HCT: 36 % — ABNORMAL LOW (ref 39.0–52.0)
Hemoglobin: 11 g/dL — ABNORMAL LOW (ref 13.0–17.0)
MCH: 28.5 pg (ref 26.0–34.0)
MCHC: 30.6 g/dL (ref 30.0–36.0)
MCV: 93.3 fL (ref 80.0–100.0)
Platelets: 190 10*3/uL (ref 150–400)
RBC: 3.86 MIL/uL — ABNORMAL LOW (ref 4.22–5.81)
RDW: 17.7 % — ABNORMAL HIGH (ref 11.5–15.5)
WBC: 7.4 10*3/uL (ref 4.0–10.5)
nRBC: 0 % (ref 0.0–0.2)

## 2022-09-20 LAB — HEPATITIS B SURFACE ANTIBODY, QUANTITATIVE: Hep B S AB Quant (Post): >1000 m[IU]/mL (ref >9.9)

## 2022-09-20 LAB — PROTIME-INR
INR: 1.1 (ref 0.8–1.2)
Prothrombin Time: 13.8 seconds (ref 11.4–15.2)

## 2022-09-20 LAB — CBG MONITORING, ED: Glucose-Capillary: 89 mg/dL (ref 70–99)

## 2022-09-20 MED ORDER — TECHNETIUM TO 99M ALBUMIN AGGREGATED
4.0000 | Freq: Once | INTRAVENOUS | Status: AC | PRN
  Administered 2022-09-20: 4 via INTRAVENOUS

## 2022-09-20 MED ORDER — CHLORHEXIDINE GLUCONATE CLOTH 2 % EX PADS: 6.0000 | MEDICATED_PAD | Freq: Every day | CUTANEOUS | Status: DC

## 2022-09-20 NOTE — ED Notes (Signed)
Pt ambulatory to and from restroom with walker and stand by assist

## 2022-09-20 NOTE — Progress Notes (Signed)
PROGRESS NOTE    Anders Hohmann  OVF:[id number] DOB: 1940-12-11 DOA: 09/19/2022 PCP: [name], MD   Brief Narrative: Alesandro Stueve is a 82 y.o. male with a history of hypertension, combined heart failure, ESRD on HD, CAD, pulmonary fibrosis, Sjogren's disease. Patient presented secondary to shortness of breath secondary to missed hemodialysis and resultant fluid overload. Nephrology consulted and started HD while inpatient.   Assessment and Plan:  Fluid overload Non-cardiogenic. Secondary to ESRD  Acute respiratory failure with hypoxia Secondary to fluid overload. Improved with HD. Still requiring oxygen. -Wean to room air  Hyperkalemia Secondary to missing HD. -Management with hemodialysis  Primary hypertension Noted. Patient is on amlodipine as an outpatient which was held. Patient also listed as prescribed Coreg, for which he is not taking.  ESRD on HD Nephrology consulted for hemodialysis  CAD -Continue aspirin  Glaucoma -Continue Azopt, alphagan and xalatan  Pulmonary fibrosis Noted.  DVT prophylaxis: Heparin subq Code Status:   Code Status: Full Code Family Communication: Wife at bedside Disposition Plan: Discharge home tomorrow after HD per nephrology recommendations   Consultants:  Nephrology  Procedures:  Hemodialysis  Antimicrobials: None    Subjective: Patient reports feeling much better since his HD session. No other concerns.  Objective: BP (!) 161/80   Pulse 91   Temp (!) 97.5 F (36.4 C) (Oral)   Resp 20   Wt 72.6 kg   SpO2 100%   BMI 22.97 kg/m   Examination:  General exam: Appears calm and comfortable Respiratory system: Respiratory effort normal. Cardiovascular system: S1 & S2 heard, RRR. Gastrointestinal system: Abdomen is nondistended, soft and nontender. Normal bowel sounds heard. Central nervous system: Alert and oriented. No focal neurological deficits. Musculoskeletal: No edema. No calf tenderness Skin: No  cyanosis. No rashes Psychiatry: Judgement and insight appear normal. Mood & affect appropriate.    Data Reviewed: I have personally reviewed following labs and imaging studies  CBC Lab Results  Component Value Date   WBC 7.4 09/20/2022   RBC 3.86 (L) 09/20/2022   HGB 11.0 (L) 09/20/2022   HCT 36.0 (L) 09/20/2022   MCV 93.3 09/20/2022   MCH 28.5 09/20/2022   PLT 190 09/20/2022   MCHC 30.6 09/20/2022   RDW 17.7 (H) 09/20/2022   LYMPHSABS 1.0 08/12/2022   MONOABS 1.8 (H) 08/12/2022   EOSABS 0.4 08/12/2022   BASOSABS 0.1 84/16/6063     Last metabolic panel Lab Results  Component Value Date   NA 135 09/20/2022   K 5.3 (H) 09/20/2022   CL 94 (L) 09/20/2022   CO2 25 09/20/2022   BUN 55 (H) 09/20/2022   CREATININE 11.02 (H) 09/20/2022   GLUCOSE 81 09/20/2022   GFRNONAA 4 (L) 09/20/2022   GFRAA 10 (L) 11/07/2015   CALCIUM 8.9 09/20/2022   PHOS 4.0 08/12/2022   PROT 7.4 09/20/2022   ALBUMIN 2.9 (L) 09/20/2022   LABGLOB 3.9 12/18/2014   AGRATIO 0.7 12/18/2014   BILITOT 0.8 09/20/2022   ALKPHOS 67 09/20/2022   AST 16 09/20/2022   ALT 7 09/20/2022   ANIONGAP 16 (H) 09/20/2022    GFR: Estimated Creatinine Clearance: 5.5 mL/min (A) (by C-G formula based on SCr of 11.02 mg/dL (H)).  Recent Results (from the past 240 hour(s))  Resp panel by RT-PCR (RSV, Flu A&B, Covid) Anterior Nasal Swab     Status: None   Collection Time: 09/19/22  6:46 AM   Specimen: Anterior Nasal Swab  Result Value Ref Range Status   SARS Coronavirus 2 by  RT PCR NEGATIVE NEGATIVE Final    Comment: (NOTE) SARS-CoV-2 target nucleic acids are NOT DETECTED.  The SARS-CoV-2 RNA is generally detectable in upper respiratory specimens during the acute phase of infection. The lowest concentration of SARS-CoV-2 viral copies this assay can detect is 138 copies/mL. A negative result does not preclude SARS-Cov-2 infection and should not be used as the sole basis for treatment or other patient management  decisions. A negative result may occur with  improper specimen collection/handling, submission of specimen other than nasopharyngeal swab, presence of viral mutation(s) within the areas targeted by this assay, and inadequate number of viral copies(<138 copies/mL). A negative result must be combined with clinical observations, patient history, and epidemiological information. The expected result is Negative.  Fact Sheet for Patients:  EntrepreneurPulse.com.au  Fact Sheet for Healthcare Providers:  IncredibleEmployment.be  This test is no t yet approved or cleared by the [place] FDA and  has been authorized for detection and/or diagnosis of SARS-CoV-2 by FDA under an Emergency Use Authorization (EUA). This EUA will remain  in effect (meaning this test can be used) for the duration of the COVID-19 declaration under Section 564(b)(1) of the Act, 21 U.S.C.section 360bbb-3(b)(1), unless the authorization is terminated  or revoked sooner.       Influenza A by PCR NEGATIVE NEGATIVE Final   Influenza B by PCR NEGATIVE NEGATIVE Final    Comment: (NOTE) The Xpert Xpress SARS-CoV-2/FLU/RSV plus assay is intended as an aid in the diagnosis of influenza from Nasopharyngeal swab specimens and should not be used as a sole basis for treatment. Nasal washings and aspirates are unacceptable for Xpert Xpress SARS-CoV-2/FLU/RSV testing.  Fact Sheet for Patients: EntrepreneurPulse.com.au  Fact Sheet for Healthcare Providers: IncredibleEmployment.be  This test is not yet approved or cleared by the [place] FDA and has been authorized for detection and/or diagnosis of SARS-CoV-2 by FDA under an Emergency Use Authorization (EUA). This EUA will remain in effect (meaning this test can be used) for the duration of the COVID-19 declaration under Section 564(b)(1) of the Act, 21 U.S.C. section 360bbb-3(b)(1), unless the  authorization is terminated or revoked.     Resp Syncytial Virus by PCR NEGATIVE NEGATIVE Final    Comment: (NOTE) Fact Sheet for Patients: EntrepreneurPulse.com.au  Fact Sheet for Healthcare Providers: IncredibleEmployment.be  This test is not yet approved or cleared by the [place] FDA and has been authorized for detection and/or diagnosis of SARS-CoV-2 by FDA under an Emergency Use Authorization (EUA). This EUA will remain in effect (meaning this test can be used) for the duration of the COVID-19 declaration under Section 564(b)(1) of the Act, 21 U.S.C. section 360bbb-3(b)(1), unless the authorization is terminated or revoked.  Performed at [facility], [place] [address], [place], [place] [address]   Respiratory (~20 pathogens) panel by PCR     Status: None   Collection Time: 09/19/22  9:36 PM   Specimen: Nasopharyngeal Swab; Respiratory  Result Value Ref Range Status   Adenovirus NOT DETECTED NOT DETECTED Final   Coronavirus 229E NOT DETECTED NOT DETECTED Final    Comment: (NOTE) The Coronavirus on the Respiratory Panel, DOES NOT test for the novel  Coronavirus (2019 nCoV)    Coronavirus HKU1 NOT DETECTED NOT DETECTED Final   Coronavirus NL63 NOT DETECTED NOT DETECTED Final   Coronavirus OC43 NOT DETECTED NOT DETECTED Final   Metapneumovirus NOT DETECTED NOT DETECTED Final   Rhinovirus / Enterovirus NOT DETECTED NOT DETECTED Final   Influenza A NOT DETECTED  NOT DETECTED Final   Influenza B NOT DETECTED NOT DETECTED Final   Parainfluenza Virus 1 NOT DETECTED NOT DETECTED Final   Parainfluenza Virus 2 NOT DETECTED NOT DETECTED Final   Parainfluenza Virus 3 NOT DETECTED NOT DETECTED Final   Parainfluenza Virus 4 NOT DETECTED NOT DETECTED Final   Respiratory Syncytial Virus NOT DETECTED NOT DETECTED Final   Bordetella pertussis NOT DETECTED NOT DETECTED Final   Bordetella Parapertussis NOT DETECTED NOT DETECTED Final    Chlamydophila pneumoniae NOT DETECTED NOT DETECTED Final   Mycoplasma pneumoniae NOT DETECTED NOT DETECTED Final    Comment: Performed at [facility], [place] [address] [place],  [address]      Radiology Studies: VAS [place] LOWER EXTREMITY VENOUS (DVT)  Result Date: 09/20/2022  Lower Venous DVT Study Patient Name:  LIAM CAMMARATA  Date of Exam:   09/20/2022 Medical Rec #: [id number]        Accession #:    [id number] Date of Birth: [birth year]        Patient Gender: M Patient Age:   23 years Exam Location:  [facility] Procedure:      VAS [place] LOWER EXTREMITY VENOUS (DVT) Referring Phys: Myles Rosenthal --------------------------------------------------------------------------------  Indications: Swelling, and Edema.  Comparison Study: 04/18/15 prior Performing Technologist: Archie Patten RVS  Examination Guidelines: A complete evaluation includes B-mode imaging, spectral Doppler, color Doppler, and power Doppler as needed of all accessible portions of each vessel. Bilateral testing is considered an integral part of a complete examination. Limited examinations for reoccurring indications may be performed as noted. The reflux portion of the exam is performed with the patient in reverse Trendelenburg.  +---------+---------------+---------+-----------+----------+---------------+ RIGHT    CompressibilityPhasicitySpontaneityPropertiesThrombus Aging  +---------+---------------+---------+-----------+----------+---------------+ CFV      Full           Yes      Yes                                  +---------+---------------+---------+-----------+----------+---------------+ SFJ      Full                                                         +---------+---------------+---------+-----------+----------+---------------+ FV Prox  Full                                                         +---------+---------------+---------+-----------+----------+---------------+ FV Mid   Full                                                          +---------+---------------+---------+-----------+----------+---------------+ FV DistalFull           Yes      Yes                                  +---------+---------------+---------+-----------+----------+---------------+ PFV  Full                                                         +---------+---------------+---------+-----------+----------+---------------+ POP      Full           Yes      Yes                                  +---------+---------------+---------+-----------+----------+---------------+ PTV      Full                                                         +---------+---------------+---------+-----------+----------+---------------+ PERO                                                  patent by color +---------+---------------+---------+-----------+----------+---------------+   +---------+---------------+---------+-----------+----------+-------------------+ LEFT     CompressibilityPhasicitySpontaneityPropertiesThrombus Aging      +---------+---------------+---------+-----------+----------+-------------------+ CFV      Full           Yes      Yes                                      +---------+---------------+---------+-----------+----------+-------------------+ SFJ      Full                                                             +---------+---------------+---------+-----------+----------+-------------------+ FV Prox  Full                                                             +---------+---------------+---------+-----------+----------+-------------------+ FV Mid   Full           Yes      Yes                                      +---------+---------------+---------+-----------+----------+-------------------+ FV DistalFull           Yes      Yes                                       +---------+---------------+---------+-----------+----------+-------------------+ PFV      Full                                                             +---------+---------------+---------+-----------+----------+-------------------+  POP      Full           Yes      Yes                                      +---------+---------------+---------+-----------+----------+-------------------+ PTV      Full                                                             +---------+---------------+---------+-----------+----------+-------------------+ PERO                                                  Not well visualized +---------+---------------+---------+-----------+----------+-------------------+     Summary: BILATERAL: - No evidence of deep vein thrombosis seen in the lower extremities, bilaterally. -No evidence of popliteal cyst, bilaterally.   *See table(s) above for measurements and observations. Electronically signed by Monica Martinez MD on 09/20/2022 at 2:55:28 PM.    Final    NM Pulmonary Perfusion  Result Date: 09/20/2022 CLINICAL DATA:  Clinical suspicion of pulmonary embolism EXAM: NUCLEAR MEDICINE PERFUSION LUNG SCAN TECHNIQUE: Perfusion images were obtained in multiple projections after intravenous injection of radiopharmaceutical. Ventilation scans intentionally deferred if perfusion scan and chest x-ray adequate for interpretation during COVID 19 epidemic. RADIOPHARMACEUTICALS:  Four mCi Tc-19m MAA IV COMPARISON:  Chest radiograph done on 09/19/2022 FINDINGS: There are no wedge-shaped segmental or subsegmental perfusion defects. There is decreased activity in the region of major fissure in right lower lung field, possibly due to loculated pleural effusion. IMPRESSION: Low probability for pulmonary embolism. Electronically Signed   By: Elmer Picker M.D.   On: 09/20/2022 09:32   CT CHEST WO CONTRAST  Result Date: 09/19/2022 CLINICAL DATA:  Respiratory illness.   Nondiagnostic x-ray. EXAM: CT CHEST WITHOUT CONTRAST TECHNIQUE: Multidetector CT imaging of the chest was performed following the standard protocol without IV contrast. RADIATION DOSE REDUCTION: This exam was performed according to the departmental dose-optimization program which includes automated exposure control, adjustment of the mA and/or kV according to patient size and/or use of iterative reconstruction technique. COMPARISON:  Radiograph 09/19/2022 FINDINGS: Cardiovascular: Cardiomegaly. Advanced coronary artery calcification. Aortic calcification. Aortic valve and mitral annular calcification. Trace pericardial effusion. Right brachiocephalic vein stent. Numerous collaterals in the right upper chest. Mediastinum/Nodes: Calcified mediastinal and hilar nodes, increased from 05/20/2021 prior. For example a pretracheal node measures 1.3 cm in short axis (3/28), previously 1.0 cm. Grossly unremarkable esophagus. Lungs/Pleura: Diffuse ground-glass opacities and interlobular septal thickening greatest in the lower lungs suggestive of edema. Lower lung predominant bronchial wall thickening and peribronchovascular atelectasis or infiltrates. Small bilateral pleural effusions. The fluid tracks superiorly along the lateral chest walls and into the right major and minor fissure as well as the left major fissure. The pleural fluid demonstrates low-density (approximately 0-5 Hounsfield units). Additional locules of low-density fluid in both apices. No definite pleural thickening though this is incompletely evaluated without IV contrast. Focal area of more prominent ground-glass opacity in the right upper lobe anteriorly measuring 1.7 x 1.6 cm is likely related to edema however follow-up is recommended to ensure  resolution (series 8/image 47). Upper Abdomen: Low-density lesions in the kidneys are better evaluated on CT abdomen and pelvis with contrast 05/20/2021 and appear grossly similar. No acute abnormality.  Musculoskeletal: No acute abnormality. IMPRESSION: Alveolar and interstitial edema bilaterally. Loculated bilateral pleural effusions with low-density fluid. These may be simple effusion secondary to CHF as there is no definite pleural thickening however this is incompletely evaluated without IV contrast. Focal area of more prominent ground-glass opacity in the right upper lobe anteriorly measuring 1.7 x 1.6 cm is likely related to edema however follow-up is recommended to ensure resolution. Cardiomegaly. Advanced coronary artery calcification. Aortic Atherosclerosis (ICD10-I70.0). Increased size of multiple calcified mediastinal nodes, likely reactive however continued attention on follow-up is recommended. Electronically Signed   By: Placido Sou M.D.   On: 09/19/2022 23:30   DG Chest 2 View  Result Date: 09/19/2022 CLINICAL DATA:  Hypoxia EXAM: CHEST - 2 VIEW COMPARISON:  radiographs 09/19/2022 at 7:38 a.m. FINDINGS: Stable cardiomegaly. Aortic atherosclerotic calcification. Decreased diffuse hazy interstitial and airspace opacities. Decreased layering bilateral pleural effusions. Partially loculated fluid or pleural thickening along the right lower chest and within the left apex. Unchanged triangular-shaped hazy opacity in the left mid to lower lung may represent fluid in the fissure. Right brachiocephalic vascular stent. IMPRESSION: 1. Decreased airspace and interstitial opacities since earlier today. 2. Decreased pleural effusions. Residual potentially loculated fluid along the right mid to lower chest wall, within the right minor fissure, and left apex. Electronically Signed   By: Placido Sou M.D.   On: 09/19/2022 21:29   DG Chest 2 View  Result Date: 09/19/2022 CLINICAL DATA:  Shortness of breath, missed dialysis EXAM: CHEST - 2 VIEW COMPARISON:  08/12/2022 FINDINGS: Cardiomegaly and vascular pedicle widening. Diffuse hazy appearance of the chest with new pleural fluid, some of which appears  fissural and loculated. diffuse interstitial opacity. IMPRESSION: Pulmonary edema and pleural effusions since prior. Some of the pleural fluid appears loculated. Electronically Signed   By: Jorje Guild M.D.   On: 09/19/2022 08:10      LOS: 1 day    Cordelia Poche, MD [facility] 09/20/2022, 3:57 PM   If 7PM-7AM, please contact night-coverage [email or web address]

## 2022-09-20 NOTE — ED Notes (Signed)
Dr. Teryl Lucy at bedside

## 2022-09-20 NOTE — ED Notes (Signed)
Please call daughter [phone] angle

## 2022-09-20 NOTE — Plan of Care (Signed)
  Problem: Health Behavior/Discharge Planning: Goal: Ability to manage health-related needs will improve Outcome: Progressing   Problem: Clinical Measurements: Goal: Ability to maintain clinical measurements within normal limits will improve Outcome: Progressing Goal: Will remain free from infection Outcome: Progressing Goal: Cardiovascular complication will be avoided Outcome: Progressing   Problem: Activity: Goal: Risk for activity intolerance will decrease Outcome: Progressing   

## 2022-09-20 NOTE — ED Notes (Signed)
ED TO INPATIENT HANDOFF REPORT  ED Nurse Name and Phone #: Dessa Phi; [phone]  S Name/Age/Gender Travis [name] 82 y.o. male Room/Bed: 002C/002C  Code Status   Code Status: Full Code  Home/SNF/Other Home Patient oriented to: self, place, time, and situation Is this baseline? Yes   Triage Complete: Triage complete  Chief Complaint Fluid overload [E87.70]  Triage Note Patient arrived with EMS from home reports SOB onset yesterday with chest congestion , he missed his hemodialysis treatment last Friday , denies fever or chills .    Allergies No Known Allergies  Level of Care/Admitting Diagnosis ED Disposition     ED Disposition  Admit   Condition  --   Comment  Hospital Area: [facility] [[id number]]  Level of Care: Progressive [102]  Admit to Progressive based on following criteria: NEPHROLOGY stable condition requiring close monitoring for AKI, requiring Hemodialysis or Peritoneal Dialysis either from expected electrolyte imbalance, acidosis, or fluid overload that can be managed by NIPPV or high flow oxygen.  May admit patient to [facility] or [facility] if equivalent level of care is available:: No  Covid Evaluation: Confirmed COVID Negative  Diagnosis: Fluid overload [361443]  Admitting Physician: Clance Boll [[id number]]  Attending Physician: Clance Boll [[id number]]  Certification:: I certify this patient will need inpatient services for at least 2 midnights  Estimated Length of Stay: 3          B Medical/Surgery History Past Medical History:  Diagnosis Date   Anemia    Arthritis    Chronic combined systolic and diastolic CHF (congestive heart failure) ([place])    a. Etiology of low EF not defined.   DVT (deep venous thrombosis) ([place])    a. 01/2015: RLE DVT. VQ scan intermediate probability. Underwent renal bx complicated by perinephric hematoma; anticoagulation stopped and IVC filter placed.   ESRD on hemodialysis ([place])  02/2015   a. 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 Received plasma exchange and steroids but didn't respond and ended up starting hemodialysis June 2016.    Essential hypertension    GERD (gastroesophageal reflux disease)    History of nuclear stress test    Myoview 9/19 [facility]): low risk    HTN (hypertension)    Hypoalbuminemia    Hypoglycemia    NSTEMI (non-ST elevated myocardial infarction) ([place]) 04/18/2015   OA (osteoarthritis) of knee    Perinephric hematoma 01/2015   Proctitis 07/2015   Protein calorie malnutrition ([place])    Pulmonary fibrosis ([place])    Sjogren's disease ([place]) 01/28/2015   Past Surgical History:  Procedure Laterality Date   AV FISTULA PLACEMENT Right 02/17/2015   Procedure: INSERTION OF RIGHT ARM  ARTERIOVENOUS (AV) GORE-TEX GRAFT ;  Surgeon: Elam Dutch, MD;  Location: [place];  Service: Vascular;  Laterality: Right;   FLEXIBLE SIGMOIDOSCOPY N/A 08/04/2015   Procedure: FLEXIBLE SIGMOIDOSCOPY;  Surgeon: Wilford Corner, MD;  Location: [place];  Service: Endoscopy;  Laterality: N/A;   HEMORROIDECTOMY  1999   INSERTION OF DIALYSIS CATHETER N/A 02/17/2015   Procedure: INSERTION OF DIALYSIS CATHETER RIGHT INTERNAL JUGULAR VEIN;  Surgeon: Elam Dutch, MD;  Location: [place];  Service: Vascular;  Laterality: N/A;   MULTIPLE TOOTH EXTRACTIONS     PERIPHERAL VASCULAR BALLOON ANGIOPLASTY Right 01/23/2019   Procedure: PERIPHERAL VASCULAR BALLOON ANGIOPLASTY;  Surgeon: Elam Dutch, MD;  Location: [place] [facility];  Service: Cardiovascular;  Laterality: Right;   REVISION OF  ARTERIOVENOUS GORETEX GRAFT Right 03/29/2017   Procedure: REVISION OF ARTERIOVENOUS GORETEX GRAFT;  Surgeon: Conrad Reubens, MD;  Location: [place];  Service: Vascular;  Laterality: Right;   RIGHT/LEFT HEART CATH AND CORONARY ANGIOGRAPHY N/A 07/06/2022   Procedure: RIGHT/LEFT HEART CATH AND CORONARY ANGIOGRAPHY;  Surgeon: Leonie Man,  MD;  Location: [place] [facility];  Service: Cardiovascular;  Laterality: N/A;   Surgical procedure to remove a mole as a child Right Eye area   At around 77 years old     A IV Location/Drains/Wounds Patient Lines/Drains/Airways Status     Active Line/Drains/Airways     Name Placement date Placement time Site Days   Peripheral IV 09/20/22 20 G Anterior;Left;Proximal Forearm 09/20/22  --  Forearm  less than 1   Fistula / Graft Right Forearm Arteriovenous vein graft --  --  Forearm  --            Intake/Output Last 24 hours No intake or output data in the 24 hours ending 09/20/22 1647  Labs/Imaging Results for orders placed or performed during the hospital encounter of 09/19/22 (from the past 48 hour(s))  Resp panel by RT-PCR (RSV, Flu A&B, Covid) Anterior Nasal Swab     Status: None   Collection Time: 09/19/22  6:46 AM   Specimen: Anterior Nasal Swab  Result Value Ref Range   SARS Coronavirus 2 by RT PCR NEGATIVE NEGATIVE    Comment: (NOTE) SARS-CoV-2 target nucleic acids are NOT DETECTED.  The SARS-CoV-2 RNA is generally detectable in upper respiratory specimens during the acute phase of infection. The lowest concentration of SARS-CoV-2 viral copies this assay can detect is 138 copies/mL. A negative result does not preclude SARS-Cov-2 infection and should not be used as the sole basis for treatment or other patient management decisions. A negative result may occur with  improper specimen collection/handling, submission of specimen other than nasopharyngeal swab, presence of viral mutation(s) within the areas targeted by this assay, and inadequate number of viral copies(<138 copies/mL). A negative result must be combined with clinical observations, patient history, and epidemiological information. The expected result is Negative.  Fact Sheet for Patients:  EntrepreneurPulse.com.au  Fact Sheet for Healthcare Providers:   IncredibleEmployment.be  This test is no t yet approved or cleared by the [place] FDA and  has been authorized for detection and/or diagnosis of SARS-CoV-2 by FDA under an Emergency Use Authorization (EUA). This EUA will remain  in effect (meaning this test can be used) for the duration of the COVID-19 declaration under Section 564(b)(1) of the Act, 21 U.S.C.section 360bbb-3(b)(1), unless the authorization is terminated  or revoked sooner.       Influenza A by PCR NEGATIVE NEGATIVE   Influenza B by PCR NEGATIVE NEGATIVE    Comment: (NOTE) The Xpert Xpress SARS-CoV-2/FLU/RSV plus assay is intended as an aid in the diagnosis of influenza from Nasopharyngeal swab specimens and should not be used as a sole basis for treatment. Nasal washings and aspirates are unacceptable for Xpert Xpress SARS-CoV-2/FLU/RSV testing.  Fact Sheet for Patients: EntrepreneurPulse.com.au  Fact Sheet for Healthcare Providers: IncredibleEmployment.be  This test is not yet approved or cleared by the [place] FDA and has been authorized for detection and/or diagnosis of SARS-CoV-2 by FDA under an Emergency Use Authorization (EUA). This EUA will remain in effect (meaning this test can be used) for the duration of the COVID-19 declaration under Section 564(b)(1) of the Act, 21 U.S.C. section 360bbb-3(b)(1), unless the authorization is terminated or revoked.  Resp Syncytial Virus by PCR NEGATIVE NEGATIVE    Comment: (NOTE) Fact Sheet for Patients: EntrepreneurPulse.com.au  Fact Sheet for Healthcare Providers: IncredibleEmployment.be  This test is not yet approved or cleared by the [place] FDA and has been authorized for detection and/or diagnosis of SARS-CoV-2 by FDA under an Emergency Use Authorization (EUA). This EUA will remain in effect (meaning this test can be used) for the duration of  the COVID-19 declaration under Section 564(b)(1) of the Act, 21 U.S.C. section 360bbb-3(b)(1), unless the authorization is terminated or revoked.  Performed at [facility], [place] [address] [place], [place] [address]   CBC     Status: Abnormal   Collection Time: 09/19/22  6:51 AM  Result Value Ref Range   WBC 9.7 4.0 - 10.5 K/uL   RBC 4.06 (L) 4.22 - 5.81 MIL/uL   Hemoglobin 11.6 (L) 13.0 - 17.0 g/dL   HCT 38.4 (L) 39.0 - 52.0 %   MCV 94.6 80.0 - 100.0 fL   MCH 28.6 26.0 - 34.0 pg   MCHC 30.2 30.0 - 36.0 g/dL   RDW 17.8 (H) 11.5 - 15.5 %   Platelets 228 150 - 400 K/uL   nRBC 0.0 0.0 - 0.2 %    Comment: Performed at [facility], [place] [address], [place], [place] [address]  Basic metabolic panel     Status: Abnormal   Collection Time: 09/19/22  6:51 AM  Result Value Ref Range   Sodium 140 135 - 145 mmol/L   Potassium 6.7 (HH) 3.5 - 5.1 mmol/L    Comment: HEMOLYSIS AT THIS LEVEL MAY AFFECT RESULT CRITICAL RESULT CALLED TO, READ BACK BY CRYSTAL BAINE, RN AND VERIFIED WITH W SMITH AT [phone] ON 1 7 2023    Chloride 97 (L) 98 - 111 mmol/L   CO2 25 22 - 32 mmol/L   Glucose, Bld 101 (H) 70 - 99 mg/dL    Comment: Glucose reference range applies only to samples taken after fasting for at least 8 hours.   BUN 90 (H) 8 - 23 mg/dL   Creatinine, Ser 15.40 (H) 0.61 - 1.24 mg/dL   Calcium 9.2 8.9 - 10.3 mg/dL   GFR, Estimated 3 (L) >60 mL/min    Comment: (NOTE) Calculated using the CKD-EPI Creatinine Equation (2021)    Anion gap 18 (H) 5 - 15    Comment: Performed at [facility] [address], [place], [place] [address]  Hepatic function panel     Status: Abnormal   Collection Time: 09/19/22  6:51 AM  Result Value Ref Range   Total Protein 7.7 6.5 - 8.1 g/dL   Albumin 3.1 (L) 3.5 - 5.0 g/dL   AST 19 15 - 41 U/L    Comment: HEMOLYSIS AT THIS LEVEL MAY AFFECT RESULT   ALT 5 0 - 44 U/L    Comment: HEMOLYSIS AT THIS LEVEL MAY AFFECT RESULT   Alkaline Phosphatase 76  38 - 126 U/L   Total Bilirubin 0.8 0.3 - 1.2 mg/dL    Comment: HEMOLYSIS AT THIS LEVEL MAY AFFECT RESULT   Bilirubin, Direct 0.3 (H) 0.0 - 0.2 mg/dL   Indirect Bilirubin 0.5 0.3 - 0.9 mg/dL    Comment: Performed at [facility], [place] [address] [place],  [address]  Basic metabolic panel     Status: Abnormal   Collection Time: 09/19/22 10:13 AM  Result Value Ref Range   Sodium 137 135 - 145 mmol/L  Potassium 5.9 (H) 3.5 - 5.1 mmol/L   Chloride 97 (L) 98 - 111 mmol/L   CO2 23 22 - 32 mmol/L   Glucose, Bld 94 70 - 99 mg/dL    Comment: Glucose reference range applies only to samples taken after fasting for at least 8 hours.   BUN 91 (H) 8 - 23 mg/dL   Creatinine, Ser 15.54 (H) 0.61 - 1.24 mg/dL   Calcium 8.8 (L) 8.9 - 10.3 mg/dL   GFR, Estimated 3 (L) >60 mL/min    Comment: (NOTE) Calculated using the CKD-EPI Creatinine Equation (2021)    Anion gap 17 (H) 5 - 15    Comment: Performed at [facility] [address] [place], [place] [address]  Hepatitis B surface antibody,quantitative     Status: None   Collection Time: 09/19/22 10:13 AM  Result Value Ref Range   Hep B S AB Quant (Post) >1,000.0 Immunity>9.9 mIU/mL    Comment: (NOTE)  Status of Immunity                     Anti-HBs Level  ------------------                     -------------- Inconsistent with Immunity                   0.0 - 9.9 Consistent with Immunity                          >9.9 Performed At: [facility] [place], [place] [id number] Rush Farmer MD PT:[id number]   Hepatitis B surface antigen     Status: None   Collection Time: 09/19/22 10:36 AM  Result Value Ref Range   Hepatitis B Surface Ag NON REACTIVE NON REACTIVE    Comment: Performed at [facility] [address] [place], [place] [address]  CBG monitoring, ED     Status: None   Collection Time: 09/19/22 11:44 AM  Result Value Ref Range   Glucose-Capillary 70 70 - 99 mg/dL    Comment: Glucose  reference range applies only to samples taken after fasting for at least 8 hours.   Comment 1 Notify RN    Comment 2 Document in Chart   CBG monitoring, ED     Status: None   Collection Time: 09/19/22  9:16 PM  Result Value Ref Range   Glucose-Capillary 83 70 - 99 mg/dL    Comment: Glucose reference range applies only to samples taken after fasting for at least 8 hours.  Troponin I (High Sensitivity)     Status: Abnormal   Collection Time: 09/19/22  9:36 PM  Result Value Ref Range   Troponin I (High Sensitivity) 143 (HH) <18 ng/L    Comment: CRITICAL RESULT CALLED TO, READ BACK BY AND VERIFIED WITH ARNETT COBB RN 09/19/22 2237 M KOROLESKI (NOTE) Elevated high sensitivity troponin I (hsTnI) values and significant  changes across serial measurements may suggest ACS but many other  chronic and acute conditions are known to elevate hsTnI results.  Refer to the "Links" section for chest pain algorithms and additional  guidance. Performed at [facility], [place] [address], [place], [place] [address]   Brain natriuretic peptide     Status: Abnormal   Collection Time: 09/19/22  9:36 PM  Result Value Ref Range   B Natriuretic Peptide 1,977.6 (H) 0.0 - 100.0 pg/mL  Comment: Performed at [facility], [place] [address], [place], [place] [address]  Respiratory (~20 pathogens) panel by PCR     Status: None   Collection Time: 09/19/22  9:36 PM   Specimen: Nasopharyngeal Swab; Respiratory  Result Value Ref Range   Adenovirus NOT DETECTED NOT DETECTED   Coronavirus 229E NOT DETECTED NOT DETECTED    Comment: (NOTE) The Coronavirus on the Respiratory Panel, DOES NOT test for the novel  Coronavirus (2019 nCoV)    Coronavirus HKU1 NOT DETECTED NOT DETECTED   Coronavirus NL63 NOT DETECTED NOT DETECTED   Coronavirus OC43 NOT DETECTED NOT DETECTED   Metapneumovirus NOT DETECTED NOT DETECTED   Rhinovirus / Enterovirus NOT DETECTED NOT DETECTED   Influenza A NOT DETECTED NOT DETECTED    Influenza B NOT DETECTED NOT DETECTED   Parainfluenza Virus 1 NOT DETECTED NOT DETECTED   Parainfluenza Virus 2 NOT DETECTED NOT DETECTED   Parainfluenza Virus 3 NOT DETECTED NOT DETECTED   Parainfluenza Virus 4 NOT DETECTED NOT DETECTED   Respiratory Syncytial Virus NOT DETECTED NOT DETECTED   Bordetella pertussis NOT DETECTED NOT DETECTED   Bordetella Parapertussis NOT DETECTED NOT DETECTED   Chlamydophila pneumoniae NOT DETECTED NOT DETECTED   Mycoplasma pneumoniae NOT DETECTED NOT DETECTED    Comment: Performed at [facility], [place] [address] [place], [place] [address]  D-dimer, quantitative     Status: Abnormal   Collection Time: 09/19/22  9:36 PM  Result Value Ref Range   D-Dimer, Quant 3.83 (H) 0.00 - 0.50 ug/mL-FEU    Comment: (NOTE) At the manufacturer cut-off value of 0.5 g/mL FEU, this assay has a negative predictive value of 95-100%.This assay is intended for use in conjunction with a clinical pretest probability (PTP) assessment model to exclude pulmonary embolism (PE) and deep venous thrombosis (DVT) in outpatients suspected of PE or DVT. Results should be correlated with clinical presentation. Performed at [facility], [place] [address] [place], [place] [address]   CBC     Status: Abnormal   Collection Time: 09/19/22 10:27 PM  Result Value Ref Range   WBC 8.0 4.0 - 10.5 K/uL   RBC 3.77 (L) 4.22 - 5.81 MIL/uL   Hemoglobin 10.9 (L) 13.0 - 17.0 g/dL   HCT 35.4 (L) 39.0 - 52.0 %   MCV 93.9 80.0 - 100.0 fL   MCH 28.9 26.0 - 34.0 pg   MCHC 30.8 30.0 - 36.0 g/dL   RDW 17.7 (H) 11.5 - 15.5 %   Platelets 175 150 - 400 K/uL   nRBC 0.0 0.0 - 0.2 %    Comment: Performed at [facility], [place] [address] [place], [place] [address]  Creatinine, serum     Status: Abnormal   Collection Time: 09/19/22 10:27 PM  Result Value Ref Range   Creatinine, Ser 10.34 (H) 0.61 - 1.24 mg/dL   GFR, Estimated 5 (L) >60 mL/min    Comment: (NOTE) Calculated using the  CKD-EPI Creatinine Equation (2021) Performed at [facility] [address], [place], [place] [address]   Troponin I (High Sensitivity)     Status: Abnormal   Collection Time: 09/19/22 10:27 PM  Result Value Ref Range   Troponin I (High Sensitivity) 128 (HH) <18 ng/L    Comment: CRITICAL RESULT CALLED TO, READ BACK BY AND VERIFIED WITH  A. COBB, RN, 2328, 09/19/22, EADEDOKUN (NOTE) Elevated high sensitivity troponin I (hsTnI) values and significant  changes across serial measurements may suggest  ACS but many other  chronic and acute conditions are known to elevate hsTnI results.  Refer to the "Links" section for chest pain algorithms and additional  guidance. Performed at [facility], [place] [address] [place], [place] [address]   Comprehensive metabolic panel     Status: Abnormal   Collection Time: 09/20/22  4:20 AM  Result Value Ref Range   Sodium 135 135 - 145 mmol/L   Potassium 5.3 (H) 3.5 - 5.1 mmol/L   Chloride 94 (L) 98 - 111 mmol/L   CO2 25 22 - 32 mmol/L   Glucose, Bld 81 70 - 99 mg/dL    Comment: Glucose reference range applies only to samples taken after fasting for at least 8 hours.   BUN 55 (H) 8 - 23 mg/dL   Creatinine, Ser 11.02 (H) 0.61 - 1.24 mg/dL   Calcium 8.9 8.9 - 10.3 mg/dL   Total Protein 7.4 6.5 - 8.1 g/dL   Albumin 2.9 (L) 3.5 - 5.0 g/dL   AST 16 15 - 41 U/L   ALT 7 0 - 44 U/L   Alkaline Phosphatase 67 38 - 126 U/L   Total Bilirubin 0.8 0.3 - 1.2 mg/dL   GFR, Estimated 4 (L) >60 mL/min    Comment: (NOTE) Calculated using the CKD-EPI Creatinine Equation (2021)    Anion gap 16 (H) 5 - 15    Comment: Performed at [facility], [place] [address] [place], [place] [address]  CBC     Status: Abnormal   Collection Time: 09/20/22  4:20 AM  Result Value Ref Range   WBC 7.4 4.0 - 10.5 K/uL   RBC 3.86 (L) 4.22 - 5.81 MIL/uL   Hemoglobin 11.0 (L) 13.0 - 17.0 g/dL   HCT 36.0 (L) 39.0 - 52.0 %   MCV 93.3 80.0 - 100.0 fL   MCH 28.5 26.0 - 34.0  pg   MCHC 30.6 30.0 - 36.0 g/dL   RDW 17.7 (H) 11.5 - 15.5 %   Platelets 190 150 - 400 K/uL   nRBC 0.0 0.0 - 0.2 %    Comment: Performed at [facility], [place] [address], [place], [place] [address]  Protime-INR     Status: None   Collection Time: 09/20/22  4:20 AM  Result Value Ref Range   Prothrombin Time 13.8 11.4 - 15.2 seconds   INR 1.1 0.8 - 1.2    Comment: (NOTE) INR goal varies based on device and disease states. Performed at [facility], [place] [address] [place], [place] [address]   CBG monitoring, ED     Status: None   Collection Time: 09/20/22 11:42 AM  Result Value Ref Range   Glucose-Capillary 89 70 - 99 mg/dL    Comment: Glucose reference range applies only to samples taken after fasting for at least 8 hours.   VAS [place] LOWER EXTREMITY VENOUS (DVT)  Result Date: 09/20/2022  Lower Venous DVT Study Patient Name:  Travis [name]  Date of Exam:   09/20/2022 Medical Rec #: [id number]        Accession #:    [id number] Date of Birth: Jan 15, 1941        Patient Gender: M Patient Age:   30 years Exam Location:  [facility] Procedure:      VAS [place] LOWER EXTREMITY VENOUS (DVT) Referring Phys: Myles Rosenthal --------------------------------------------------------------------------------  Indications: Swelling, and Edema.  Comparison Study: 04/18/15 prior Performing Technologist: Archie Patten RVS  Examination Guidelines: A complete  evaluation includes B-mode imaging, spectral Doppler, color Doppler, and power Doppler as needed of all accessible portions of each vessel. Bilateral testing is considered an integral part of a complete examination. Limited examinations for reoccurring indications may be performed as noted. The reflux portion of the exam is performed with the patient in reverse Trendelenburg.  +---------+---------------+---------+-----------+----------+---------------+ RIGHT    CompressibilityPhasicitySpontaneityPropertiesThrombus Aging   +---------+---------------+---------+-----------+----------+---------------+ CFV      Full           Yes      Yes                                  +---------+---------------+---------+-----------+----------+---------------+ SFJ      Full                                                         +---------+---------------+---------+-----------+----------+---------------+ FV Prox  Full                                                         +---------+---------------+---------+-----------+----------+---------------+ FV Mid   Full                                                         +---------+---------------+---------+-----------+----------+---------------+ FV DistalFull           Yes      Yes                                  +---------+---------------+---------+-----------+----------+---------------+ PFV      Full                                                         +---------+---------------+---------+-----------+----------+---------------+ POP      Full           Yes      Yes                                  +---------+---------------+---------+-----------+----------+---------------+ PTV      Full                                                         +---------+---------------+---------+-----------+----------+---------------+ PERO  patent by color +---------+---------------+---------+-----------+----------+---------------+   +---------+---------------+---------+-----------+----------+-------------------+ LEFT     CompressibilityPhasicitySpontaneityPropertiesThrombus Aging      +---------+---------------+---------+-----------+----------+-------------------+ CFV      Full           Yes      Yes                                      +---------+---------------+---------+-----------+----------+-------------------+ SFJ      Full                                                              +---------+---------------+---------+-----------+----------+-------------------+ FV Prox  Full                                                             +---------+---------------+---------+-----------+----------+-------------------+ FV Mid   Full           Yes      Yes                                      +---------+---------------+---------+-----------+----------+-------------------+ FV DistalFull           Yes      Yes                                      +---------+---------------+---------+-----------+----------+-------------------+ PFV      Full                                                             +---------+---------------+---------+-----------+----------+-------------------+ POP      Full           Yes      Yes                                      +---------+---------------+---------+-----------+----------+-------------------+ PTV      Full                                                             +---------+---------------+---------+-----------+----------+-------------------+ PERO                                                  Not well visualized +---------+---------------+---------+-----------+----------+-------------------+     Summary: BILATERAL: - No evidence of deep vein thrombosis seen in  the lower extremities, bilaterally. -No evidence of popliteal cyst, bilaterally.   *See table(s) above for measurements and observations. Electronically signed by Monica Martinez MD on 09/20/2022 at 2:55:28 PM.    Final    NM Pulmonary Perfusion  Result Date: 09/20/2022 CLINICAL DATA:  Clinical suspicion of pulmonary embolism EXAM: NUCLEAR MEDICINE PERFUSION LUNG SCAN TECHNIQUE: Perfusion images were obtained in multiple projections after intravenous injection of radiopharmaceutical. Ventilation scans intentionally deferred if perfusion scan and chest x-ray adequate for interpretation during COVID 19 epidemic. RADIOPHARMACEUTICALS:  Four mCi Tc-72m  MAA IV COMPARISON:  Chest radiograph done on 09/19/2022 FINDINGS: There are no wedge-shaped segmental or subsegmental perfusion defects. There is decreased activity in the region of major fissure in right lower lung field, possibly due to loculated pleural effusion. IMPRESSION: Low probability for pulmonary embolism. Electronically Signed   By: Elmer Picker M.D.   On: 09/20/2022 09:32   CT CHEST WO CONTRAST  Result Date: 09/19/2022 CLINICAL DATA:  Respiratory illness.  Nondiagnostic x-ray. EXAM: CT CHEST WITHOUT CONTRAST TECHNIQUE: Multidetector CT imaging of the chest was performed following the standard protocol without IV contrast. RADIATION DOSE REDUCTION: This exam was performed according to the departmental dose-optimization program which includes automated exposure control, adjustment of the mA and/or kV according to patient size and/or use of iterative reconstruction technique. COMPARISON:  Radiograph 09/19/2022 FINDINGS: Cardiovascular: Cardiomegaly. Advanced coronary artery calcification. Aortic calcification. Aortic valve and mitral annular calcification. Trace pericardial effusion. Right brachiocephalic vein stent. Numerous collaterals in the right upper chest. Mediastinum/Nodes: Calcified mediastinal and hilar nodes, increased from 05/20/2021 prior. For example a pretracheal node measures 1.3 cm in short axis (3/28), previously 1.0 cm. Grossly unremarkable esophagus. Lungs/Pleura: Diffuse ground-glass opacities and interlobular septal thickening greatest in the lower lungs suggestive of edema. Lower lung predominant bronchial wall thickening and peribronchovascular atelectasis or infiltrates. Small bilateral pleural effusions. The fluid tracks superiorly along the lateral chest walls and into the right major and minor fissure as well as the left major fissure. The pleural fluid demonstrates low-density (approximately 0-5 Hounsfield units). Additional locules of low-density fluid in both  apices. No definite pleural thickening though this is incompletely evaluated without IV contrast. Focal area of more prominent ground-glass opacity in the right upper lobe anteriorly measuring 1.7 x 1.6 cm is likely related to edema however follow-up is recommended to ensure resolution (series 8/image 47). Upper Abdomen: Low-density lesions in the kidneys are better evaluated on CT abdomen and pelvis with contrast 05/20/2021 and appear grossly similar. No acute abnormality. Musculoskeletal: No acute abnormality. IMPRESSION: Alveolar and interstitial edema bilaterally. Loculated bilateral pleural effusions with low-density fluid. These may be simple effusion secondary to CHF as there is no definite pleural thickening however this is incompletely evaluated without IV contrast. Focal area of more prominent ground-glass opacity in the right upper lobe anteriorly measuring 1.7 x 1.6 cm is likely related to edema however follow-up is recommended to ensure resolution. Cardiomegaly. Advanced coronary artery calcification. Aortic Atherosclerosis (ICD10-I70.0). Increased size of multiple calcified mediastinal nodes, likely reactive however continued attention on follow-up is recommended. Electronically Signed   By: Placido Sou M.D.   On: 09/19/2022 23:30   DG Chest 2 View  Result Date: 09/19/2022 CLINICAL DATA:  Hypoxia EXAM: CHEST - 2 VIEW COMPARISON:  radiographs 09/19/2022 at 7:38 a.m. FINDINGS: Stable cardiomegaly. Aortic atherosclerotic calcification. Decreased diffuse hazy interstitial and airspace opacities. Decreased layering bilateral pleural effusions. Partially loculated fluid or pleural thickening along the right lower chest and within the left apex. Unchanged  triangular-shaped hazy opacity in the left mid to lower lung may represent fluid in the fissure. Right brachiocephalic vascular stent. IMPRESSION: 1. Decreased airspace and interstitial opacities since earlier today. 2. Decreased pleural effusions.  Residual potentially loculated fluid along the right mid to lower chest wall, within the right minor fissure, and left apex. Electronically Signed   By: Placido Sou M.D.   On: 09/19/2022 21:29   DG Chest 2 View  Result Date: 09/19/2022 CLINICAL DATA:  Shortness of breath, missed dialysis EXAM: CHEST - 2 VIEW COMPARISON:  08/12/2022 FINDINGS: Cardiomegaly and vascular pedicle widening. Diffuse hazy appearance of the chest with new pleural fluid, some of which appears fissural and loculated. diffuse interstitial opacity. IMPRESSION: Pulmonary edema and pleural effusions since prior. Some of the pleural fluid appears loculated. Electronically Signed   By: Jorje Guild M.D.   On: 09/19/2022 08:10    Pending Labs Unresulted Labs (From admission, onward)     Start     Ordered   Pending  Hepatitis B surface antigen  Once,   R        Pending            Vitals/Pain Today's Vitals   09/20/22 1500 09/20/22 1530 09/20/22 1559 09/20/22 1600  BP: (!) 161/80   (!) 160/85  Pulse: 82     Resp: 20 20  (!) 22  Temp:      TempSrc:      SpO2: 100%     Weight:      PainSc:   Asleep     Isolation Precautions Droplet precaution  Medications Medications  insulin aspart (novoLOG) injection 5 Units (has no administration in time range)    And  dextrose 50 % solution 50 mL (has no administration in time range)  Chlorhexidine Gluconate Cloth 2 % PADS 6 each (has no administration in time range)  pentafluoroprop-tetrafluoroeth (GEBAUERS) aerosol 1 Application (has no administration in time range)  lidocaine (PF) (XYLOCAINE) 1 % injection 5 mL (has no administration in time range)  lidocaine-prilocaine (EMLA) cream 1 Application (has no administration in time range)  heparin injection 1,000 Units (has no administration in time range)  anticoagulant sodium citrate solution 5 mL (has no administration in time range)  pentafluoroprop-tetrafluoroeth (GEBAUERS) aerosol (has no administration in time  range)  aspirin EC tablet 81 mg (81 mg Oral Given 09/20/22 1139)  pantoprazole (PROTONIX) EC tablet 40 mg (40 mg Oral Given 09/20/22 1139)  sevelamer carbonate (RENVELA) tablet 1,600 mg (1,600 mg Oral Given 09/20/22 1255)  gabapentin (NEURONTIN) capsule 300 mg (300 mg Oral Given 09/20/22 1139)  albuterol (PROVENTIL) (2.5 MG/3ML) 0.083% nebulizer solution 3 mL (has no administration in time range)  brinzolamide (AZOPT) 1 % ophthalmic suspension 1 drop (1 drop Both Eyes Not Given 09/20/22 1055)    And  brimonidine (ALPHAGAN) 0.2 % ophthalmic solution 1 drop (1 drop Both Eyes Not Given 09/20/22 1055)  latanoprost (XALATAN) 0.005 % ophthalmic solution 1 drop (1 drop Both Eyes Not Given 09/20/22 0804)  timolol (TIMOPTIC-XR) 0.5 % ophthalmic gel-forming 1 drop (1 drop Both Eyes Not Given 09/20/22 1056)  heparin injection 5,000 Units (5,000 Units Subcutaneous Given 09/20/22 1358)  acetaminophen (TYLENOL) tablet 650 mg (has no administration in time range)    Or  acetaminophen (TYLENOL) suppository 650 mg (has no administration in time range)  sodium zirconium cyclosilicate (LOKELMA) packet 10 g (10 g Oral Given 09/19/22 1121)  albuterol (PROVENTIL) (2.5 MG/3ML) 0.083% nebulizer solution 5 mg (5 mg Nebulization Given  09/19/22 1122)  technetium albumin aggregated (MAA) injection solution 4 millicurie (4 millicuries Intravenous Contrast Given 09/20/22 0855)    Mobility walks with device Low fall risk   Focused Assessments    R Recommendations: See Admitting Provider Note  Report given to:   Additional Notes:

## 2022-09-20 NOTE — Progress Notes (Signed)
Lower extremity venous has been completed.   Preliminary results in CV Proc.   Travis [name] 09/20/2022 11:48 AM

## 2022-09-20 NOTE — Hospital Course (Signed)
Travis [name] is a 82 y.o. male with a history of hypertension, combined heart failure, ESRD on HD, CAD, pulmonary fibrosis, Sjogren's disease. Patient presented secondary to shortness of breath secondary to missed hemodialysis and resultant fluid overload. Nephrology consulted and started HD while inpatient.

## 2022-09-20 NOTE — ED Notes (Signed)
Pt returned from [facility]

## 2022-09-20 NOTE — ED Notes (Signed)
Patient transported to nuclear med.

## 2022-09-20 NOTE — Progress Notes (Signed)
[facility] Progress Note  Subjective: pt seen in ED.  SOB better after HD yesterday.   Vitals:   09/20/22 0545 09/20/22 0800 09/20/22 0930 09/20/22 1000  BP:  (!) 161/83 (!) 168/81 (!) 161/93  Pulse:  79 79 79  Resp: 20 15 (!) 22 (!) 22  Temp:      TempSrc:      SpO2:  100% 100% 100%  Weight:        Exam: Gen alert, no distress, Travis [name] O2 2L No rash, cyanosis or gangrene Sclera anicteric, throat clear  No jvd or bruits Chest clear bilat to bases RRR no MRG Abd soft ntnd no mass or ascites +bs GU normal male MS no joint effusions or deformity Ext trace LE edema, no wounds or ulcers Neuro is alert, Ox 3 , nf   RFA AVG +bruit       Home meds - albuterol, aspirin, gabapentin, prilosec, renvela 2 ac, robaxin prn, prns/ vits/ supps   OP HD: [facility] MWF  3.5h  70kg  400/1.5   2/2 bath  Hep none  AVG   - esa none - calcitriol 2.75 ug tiw - sensipar 90 tiw   Assessment/ Plan: AHRF/ pulm edema - likely vol overload related from missed HD last Friday. Had HD here overnight last night, feeling better. Will plan regular HD tomorrow am again to get volume down further. Hyperkalemia - K+ was 6.7, down to 5.3 today. Low K bath w/ iHD in am.  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 Had HD here overnight. Plan next HD tomorrow am off schedule.  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       Travis [name] 09/20/2022, 11:33 AM   Recent Labs  Lab 09/19/22 0651 09/19/22 1013 09/19/22 2227 09/20/22 0420  HGB 11.6*  --  10.9* 11.0*  ALBUMIN 3.1*  --   --  2.9*  CALCIUM 9.2 8.8*  --  8.9  CREATININE 15.40* 15.54* 10.34* 11.02*  K 6.7* 5.9*  --  5.3*   No results for input(s): "IRON", "TIBC", "FERRITIN" in the last 168 hours. Inpatient medications:  aspirin EC  81 mg Oral Daily   brinzolamide  1 drop Both Eyes TID   And   brimonidine  1 drop Both Eyes TID   Chlorhexidine Gluconate Cloth  6 each Topical Q0600    insulin aspart  5 Units Intravenous Once   And   dextrose  1 ampule Intravenous Once   gabapentin  300 mg Oral Daily   heparin  5,000 Units Subcutaneous Q8H   latanoprost  1 drop Both Eyes QHS   pantoprazole  40 mg Oral Daily   sevelamer carbonate  1,600 mg Oral TID WC   timolol  1 drop Both Eyes Daily    anticoagulant sodium citrate     acetaminophen **OR** acetaminophen, albuterol, anticoagulant sodium citrate, heparin, lidocaine (PF), lidocaine-prilocaine, pentafluoroprop-tetrafluoroeth

## 2022-09-21 LAB — RENAL FUNCTION PANEL
Albumin: 3 g/dL — ABNORMAL LOW (ref 3.5–5.0)
Anion gap: 17 — ABNORMAL HIGH (ref 5–15)
BUN: 73 mg/dL — ABNORMAL HIGH (ref 8–23)
CO2: 23 mmol/L (ref 22–32)
Calcium: 8.8 mg/dL — ABNORMAL LOW (ref 8.9–10.3)
Chloride: 94 mmol/L — ABNORMAL LOW (ref 98–111)
Creatinine, Ser: 13.16 mg/dL — ABNORMAL HIGH (ref 0.61–1.24)
GFR, Estimated: 3 mL/min — ABNORMAL LOW (ref >60)
Glucose, Bld: 76 mg/dL (ref 70–99)
Phosphorus: 8.2 mg/dL — ABNORMAL HIGH (ref 2.5–4.6)
Potassium: 5 mmol/L (ref 3.5–5.1)
Sodium: 134 mmol/L — ABNORMAL LOW (ref 135–145)

## 2022-09-21 LAB — CBC
HCT: 33.7 % — ABNORMAL LOW (ref 39.0–52.0)
Hemoglobin: 10.8 g/dL — ABNORMAL LOW (ref 13.0–17.0)
MCH: 28.9 pg (ref 26.0–34.0)
MCHC: 32 g/dL (ref 30.0–36.0)
MCV: 90.1 fL (ref 80.0–100.0)
Platelets: 209 10*3/uL (ref 150–400)
RBC: 3.74 MIL/uL — ABNORMAL LOW (ref 4.22–5.81)
RDW: 17.1 % — ABNORMAL HIGH (ref 11.5–15.5)
WBC: 6 10*3/uL (ref 4.0–10.5)
nRBC: 0 % (ref 0.0–0.2)

## 2022-09-21 LAB — GLUCOSE, CAPILLARY: Glucose-Capillary: 96 mg/dL (ref 70–99)

## 2022-09-21 MED ORDER — ATORVASTATIN CALCIUM 40 MG PO TABS: 40.0000 mg | ORAL_TABLET | Freq: Every day | ORAL | 0 refills | Status: DC

## 2022-09-21 MED ORDER — AMLODIPINE BESYLATE 10 MG PO TABS: 10.0000 mg | ORAL_TABLET | Freq: Every day | ORAL | 0 refills | Status: DC

## 2022-09-21 MED ORDER — CARVEDILOL 12.5 MG PO TABS: 12.5000 mg | ORAL_TABLET | Freq: Two times a day (BID) | ORAL | 0 refills | Status: DC

## 2022-09-21 NOTE — Progress Notes (Signed)
D/C order noted. Contacted [facility] to advise clinic of pt's d/c today and that pt should resume care tomorrow.   Melven Sartorius Renal Navigator [phone]

## 2022-09-21 NOTE — Progress Notes (Signed)
D/c tele. Went over AVS with pt and all questions were addressed.   Kwamaine Cuppett S Rosabell Geyer, RN  

## 2022-09-21 NOTE — Procedures (Signed)
HD Note:  Some information was entered later than the data was gathered due to patient care needs. The stated time with the data is accurate.   Received patient in bed to unit.  Alert and oriented.  Informed consent signed and in chart.   Patient tolerated well.  Transported back to the room  Alert, without acute distress.  Hand-off given to patient's nurse.   Access used: Right Graft, upper arm Access issues: None  Total UF removed: 3000 ml      [name]

## 2022-09-21 NOTE — Discharge Instructions (Signed)
Travis [name],  You were in the hospital because of trouble breathing which was caused by excess fluid from missing hemodialysis. Your breathing has improved with hemodialysis in the hospital. Please ensure you keep up with your sessions.

## 2022-09-21 NOTE — Progress Notes (Signed)
[facility] Progress Note  Subjective: pt seen in ED.  SOB better after HD yesterday.   Vitals:   09/21/22 1100 09/21/22 1130 09/21/22 1200 09/21/22 1230  BP: (!) 158/79 (!) 150/87 (!) 150/92 (!) 151/88  Pulse: 79  95   Resp: 20 18 20 20   Temp:      TempSrc:      SpO2:      Weight:      Height:        Exam: Gen alert, no distress, [place] O2 2L No rash, cyanosis or gangrene Sclera anicteric, throat clear  No jvd or bruits Chest clear bilat to bases RRR no MRG Abd soft ntnd no mass or ascites +bs GU normal male MS no joint effusions or deformity Ext trace LE edema, no wounds or ulcers Neuro is alert, Ox 3 , nf   RFA AVG +bruit       Home meds - albuterol, aspirin, gabapentin, prilosec, renvela 2 ac, robaxin prn, prns/ vits/ supps   OP HD: [facility] MWF  3.5h  70kg  400/1.5   2/2 bath  Hep none  AVG   - esa none - calcitriol 2.75 ug tiw - sensipar 90 tiw   Assessment/ Plan: AHRF/ pulm edema - likely vol overload related from missed HD last Friday. Had HD here 1/08 and was feeling better. On HD again today off schedule, should get down to dry wt today. Should be okay for dc after HD if O2 requirements are down.  Hyperkalemia - K+ was 6.7, down to 5.3 yest and 5.0 today.  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 HD today off schedule. If dc'd he should still go to his OP HD appt tomorrow.  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    Rob Domnic Vantol 09/21/2022, 1:11 PM   Recent Labs  Lab 09/20/22 0420 09/21/22 0936  HGB 11.0* 10.8*  ALBUMIN 2.9* 3.0*  CALCIUM 8.9 8.8*  PHOS  --  8.2*  CREATININE 11.02* 13.16*  K 5.3* 5.0    No results for input(s): "IRON", "TIBC", "FERRITIN" in the last 168 hours. Inpatient medications:  aspirin EC  81 mg Oral Daily   brinzolamide  1 drop Both Eyes TID   And   brimonidine  1 drop Both Eyes TID   Chlorhexidine Gluconate Cloth  6 each Topical Q0600   insulin aspart  5  Units Intravenous Once   And   dextrose  1 ampule Intravenous Once   gabapentin  300 mg Oral Daily   heparin  5,000 Units Subcutaneous Q8H   latanoprost  1 drop Both Eyes QHS   pantoprazole  40 mg Oral Daily   sevelamer carbonate  1,600 mg Oral TID WC   timolol  1 drop Both Eyes Daily    anticoagulant sodium citrate     acetaminophen **OR** acetaminophen, albuterol, anticoagulant sodium citrate, heparin, lidocaine (PF), lidocaine-prilocaine, pentafluoroprop-tetrafluoroeth

## 2022-09-21 NOTE — Discharge Summary (Signed)
Physician Discharge Summary   Patient: Travis [name] MRN: [id number] DOB: [birth year]  Admit date:     09/19/2022  Discharge date: 09/21/22  Discharge Physician: Cordelia Poche, MD   PCP: [name], MD   Recommendations at discharge:  Hospital follow-up with PCP.  Discharge Diagnoses: Principal Problem:   Hypervolemia associated with renal insufficiency Active Problems:   HTN (hypertension)   ESRD on dialysis (HCC)   Chronic combined systolic (congestive) and diastolic (congestive) heart failure (HCC)   Hyperkalemia   Glaucoma   Fluid overload  Resolved Problems:   * No resolved hospital problems. [facility] Hights Course: Travis [name] is a 82 y.o. male with a history of hypertension, combined heart failure, ESRD on HD, CAD, pulmonary fibrosis, Sjogren's disease. Patient presented secondary to shortness of breath secondary to missed hemodialysis and resultant fluid overload. Nephrology consulted and started HD while inpatient. Symptoms resolved with hemodialysis. Patient weaned to room air prior to discharge.  Assessment and Plan:  Fluid overload Non-cardiogenic. Secondary to ESRD   Acute respiratory failure with hypoxia Secondary to fluid overload. Improved with HD. Weaned to room air. Respiratory failure resolved.  Chronic combined heart failure Noted on admission but fluid overload appears non-cardiogenic. Unlikely acute on chronic heart failure   Hyperkalemia Secondary to missing HD. Managed via hemodialysis.   Primary hypertension Noted. Patient is on amlodipine as an outpatient which was held. Patient also listed as prescribed Coreg, for which he is not taking.   ESRD on HD Nephrology consulted for hemodialysis   CAD -Continue aspirin   Glaucoma -Continue Azopt, alphagan and xalatan   Pulmonary fibrosis Noted.  Consultants: Nephrology Procedures performed: Hemodialysis  Disposition: Home Diet recommendation: Renal diet  DISCHARGE  MEDICATION: Allergies as of 09/21/2022   No Known Allergies      Medication List     STOP taking these medications    methocarbamol 500 MG tablet Commonly known as: ROBAXIN       TAKE these medications    Accu-Chek Guide test strip Generic drug: glucose blood Use to test blood glucose four times daily as directed   Accu-Chek Guide w/Device Kit Use up to four times daily as directed.   Accu-Chek Softclix Lancets lancets Use to test blood glucose four times daily as directed.   acetaminophen 500 MG tablet Commonly known as: TYLENOL Take 500 mg by mouth every 6 (six) hours as needed for moderate pain.   albuterol 108 (90 Base) MCG/ACT inhaler Commonly known as: VENTOLIN HFA Inhale 2 puffs into the lungs every 6 (six) hours as needed for wheezing or shortness of breath.   amLODipine 10 MG tablet Commonly known as: NORVASC Take 1 tablet (10 mg total) by mouth daily.   aspirin EC 81 MG tablet Take 1 tablet (81 mg total) by mouth daily.   atorvastatin 40 MG tablet Commonly known as: LIPITOR Take 1 tablet (40 mg total) by mouth daily at 6 PM.   Brinzolamide-Brimonidine 1-0.2 % Susp Place 1 drop into both eyes in the morning, at noon, and at bedtime.   calcitRIOL 0.25 MCG capsule Commonly known as: ROCALTROL Take 13 capsules (3.25 mcg total) by mouth every Monday, Wednesday, and Friday with hemodialysis.   carvedilol 12.5 MG tablet Commonly known as: COREG Take 1 tablet (12.5 mg total) by mouth 2 (two) times daily with a meal.   cinacalcet 30 MG tablet Commonly known as: SENSIPAR Take 3 tablets (90 mg total) by mouth every Monday, Wednesday, and Friday with hemodialysis.   ferric  gluconate 62.5 mg in sodium chloride 0.9 % 100 mL Inject 62.5 mg into the vein every Wednesday with hemodialysis.   gabapentin 300 MG capsule Commonly known as: NEURONTIN Take 300 mg by mouth daily.   latanoprost 0.005 % ophthalmic solution Commonly known as: XALATAN Place 1 drop  into both eyes at bedtime.   midodrine 10 MG tablet Commonly known as: PROAMATINE Take 10 mg by mouth every Monday, Wednesday, and Friday. Take 10 mg (1 tablet) by mouth before dialysis and 10 mg (1 tablet) at dialysis as directed.   nitroGLYCERIN 0.4 MG SL tablet Commonly known as: NITROSTAT Place 1 tablet (0.4 mg total) under the tongue every 5 (five) minutes as needed for chest pain (for total of 3 doses at the most).   omeprazole 40 MG capsule Commonly known as: PRILOSEC Take 40 mg by mouth daily.   sevelamer carbonate 800 MG tablet Commonly known as: RENVELA Take 2 tablets (1,600 mg total) by mouth 3 (three) times daily with meals.   timolol 0.5 % ophthalmic gel-forming Commonly known as: TIMOPTIC-XR Place 1 drop into both eyes daily.        Follow-up Information     Koirala, Dibas, MD. Schedule an appointment as soon as possible for a visit in 1 day(s).   Specialty: Family Medicine Why: For hospital follow-up Contact information: [place] [address] [place] [address] [phone]                Discharge Exam: BP (!) 159/94 (BP Location: Left Arm)   Pulse 88   Temp 98 F (36.7 C) (Oral)   Resp 16   Ht 5\' 10"  (1.778 m)   Wt 72.8 kg   SpO2 97%   BMI 23.03 kg/m   General exam: Appears calm and comfortable Respiratory system: Clear to auscultation. Respiratory effort normal. Cardiovascular system: S1 & S2 heard, RRR. Murmur. Gastrointestinal system: Abdomen is nondistended, soft and nontender. Normal bowel sounds heard. Central nervous system: Alert and oriented. No focal neurological deficits. Musculoskeletal:  No calf tenderness Skin: No cyanosis. No rashes Psychiatry: Judgement and insight appear normal. Mood & affect appropriate.   Condition at discharge: stable  The results of significant diagnostics from this hospitalization (including imaging, microbiology, ancillary and laboratory) are listed below for reference.    Imaging Studies: VAS [place] LOWER EXTREMITY VENOUS (DVT)  Result Date: 09/20/2022  Lower Venous DVT Study Patient Name:  Travis [name]  Date of Exam:   09/20/2022 Medical Rec #: [id number]        Accession #:    [id number] Date of Birth: 1940-11-10        Patient Gender: M Patient Age:   89 years Exam Location:  [facility] Procedure:      VAS [place] LOWER EXTREMITY VENOUS (DVT) Referring Phys: Myles Rosenthal --------------------------------------------------------------------------------  Indications: Swelling, and Edema.  Comparison Study: 04/18/15 prior Performing Technologist: Archie Patten RVS  Examination Guidelines: A complete evaluation includes B-mode imaging, spectral Doppler, color Doppler, and power Doppler as needed of all accessible portions of each vessel. Bilateral testing is considered an integral part of a complete examination. Limited examinations for reoccurring indications may be performed as noted. The reflux portion of the exam is performed with the patient in reverse Trendelenburg.  +---------+---------------+---------+-----------+----------+---------------+ RIGHT    CompressibilityPhasicitySpontaneityPropertiesThrombus Aging  +---------+---------------+---------+-----------+----------+---------------+ CFV      Full           Yes      Yes                                  +---------+---------------+---------+-----------+----------+---------------+  SFJ      Full                                                         +---------+---------------+---------+-----------+----------+---------------+ FV Prox  Full                                                         +---------+---------------+---------+-----------+----------+---------------+ FV Mid   Full                                                         +---------+---------------+---------+-----------+----------+---------------+ FV DistalFull           Yes      Yes                                   +---------+---------------+---------+-----------+----------+---------------+ PFV      Full                                                         +---------+---------------+---------+-----------+----------+---------------+ POP      Full           Yes      Yes                                  +---------+---------------+---------+-----------+----------+---------------+ PTV      Full                                                         +---------+---------------+---------+-----------+----------+---------------+ PERO                                                  patent by color +---------+---------------+---------+-----------+----------+---------------+   +---------+---------------+---------+-----------+----------+-------------------+ LEFT     CompressibilityPhasicitySpontaneityPropertiesThrombus Aging      +---------+---------------+---------+-----------+----------+-------------------+ CFV      Full           Yes      Yes                                      +---------+---------------+---------+-----------+----------+-------------------+ SFJ      Full                                                             +---------+---------------+---------+-----------+----------+-------------------+  FV Prox  Full                                                             +---------+---------------+---------+-----------+----------+-------------------+ FV Mid   Full           Yes      Yes                                      +---------+---------------+---------+-----------+----------+-------------------+ FV DistalFull           Yes      Yes                                      +---------+---------------+---------+-----------+----------+-------------------+ PFV      Full                                                             +---------+---------------+---------+-----------+----------+-------------------+ POP      Full           Yes       Yes                                      +---------+---------------+---------+-----------+----------+-------------------+ PTV      Full                                                             +---------+---------------+---------+-----------+----------+-------------------+ PERO                                                  Not well visualized +---------+---------------+---------+-----------+----------+-------------------+     Summary: BILATERAL: - No evidence of deep vein thrombosis seen in the lower extremities, bilaterally. -No evidence of popliteal cyst, bilaterally.   *See table(s) above for measurements and observations. Electronically signed by Monica Martinez MD on 09/20/2022 at 2:55:28 PM.    Final    NM Pulmonary Perfusion  Result Date: 09/20/2022 CLINICAL DATA:  Clinical suspicion of pulmonary embolism EXAM: NUCLEAR MEDICINE PERFUSION LUNG SCAN TECHNIQUE: Perfusion images were obtained in multiple projections after intravenous injection of radiopharmaceutical. Ventilation scans intentionally deferred if perfusion scan and chest x-ray adequate for interpretation during COVID 19 epidemic. RADIOPHARMACEUTICALS:  Four mCi Tc-17m MAA IV COMPARISON:  Chest radiograph done on 09/19/2022 FINDINGS: There are no wedge-shaped segmental or subsegmental perfusion defects. There is decreased activity in the region of major fissure in right lower lung field, possibly due to loculated pleural effusion. IMPRESSION: Low probability for pulmonary embolism. Electronically Signed   By: Prudy Feeler.D.  On: 09/20/2022 09:32   CT CHEST WO CONTRAST  Result Date: 09/19/2022 CLINICAL DATA:  Respiratory illness.  Nondiagnostic x-ray. EXAM: CT CHEST WITHOUT CONTRAST TECHNIQUE: Multidetector CT imaging of the chest was performed following the standard protocol without IV contrast. RADIATION DOSE REDUCTION: This exam was performed according to the departmental dose-optimization program which  includes automated exposure control, adjustment of the mA and/or kV according to patient size and/or use of iterative reconstruction technique. COMPARISON:  Radiograph 09/19/2022 FINDINGS: Cardiovascular: Cardiomegaly. Advanced coronary artery calcification. Aortic calcification. Aortic valve and mitral annular calcification. Trace pericardial effusion. Right brachiocephalic vein stent. Numerous collaterals in the right upper chest. Mediastinum/Nodes: Calcified mediastinal and hilar nodes, increased from 05/20/2021 prior. For example a pretracheal node measures 1.3 cm in short axis (3/28), previously 1.0 cm. Grossly unremarkable esophagus. Lungs/Pleura: Diffuse ground-glass opacities and interlobular septal thickening greatest in the lower lungs suggestive of edema. Lower lung predominant bronchial wall thickening and peribronchovascular atelectasis or infiltrates. Small bilateral pleural effusions. The fluid tracks superiorly along the lateral chest walls and into the right major and minor fissure as well as the left major fissure. The pleural fluid demonstrates low-density (approximately 0-5 Hounsfield units). Additional locules of low-density fluid in both apices. No definite pleural thickening though this is incompletely evaluated without IV contrast. Focal area of more prominent ground-glass opacity in the right upper lobe anteriorly measuring 1.7 x 1.6 cm is likely related to edema however follow-up is recommended to ensure resolution (series 8/image 47). Upper Abdomen: Low-density lesions in the kidneys are better evaluated on CT abdomen and pelvis with contrast 05/20/2021 and appear grossly similar. No acute abnormality. Musculoskeletal: No acute abnormality. IMPRESSION: Alveolar and interstitial edema bilaterally. Loculated bilateral pleural effusions with low-density fluid. These may be simple effusion secondary to CHF as there is no definite pleural thickening however this is incompletely evaluated  without IV contrast. Focal area of more prominent ground-glass opacity in the right upper lobe anteriorly measuring 1.7 x 1.6 cm is likely related to edema however follow-up is recommended to ensure resolution. Cardiomegaly. Advanced coronary artery calcification. Aortic Atherosclerosis (ICD10-I70.0). Increased size of multiple calcified mediastinal nodes, likely reactive however continued attention on follow-up is recommended. Electronically Signed   By: Placido Sou M.D.   On: 09/19/2022 23:30   DG Chest 2 View  Result Date: 09/19/2022 CLINICAL DATA:  Hypoxia EXAM: CHEST - 2 VIEW COMPARISON:  radiographs 09/19/2022 at 7:38 a.m. FINDINGS: Stable cardiomegaly. Aortic atherosclerotic calcification. Decreased diffuse hazy interstitial and airspace opacities. Decreased layering bilateral pleural effusions. Partially loculated fluid or pleural thickening along the right lower chest and within the left apex. Unchanged triangular-shaped hazy opacity in the left mid to lower lung may represent fluid in the fissure. Right brachiocephalic vascular stent. IMPRESSION: 1. Decreased airspace and interstitial opacities since earlier today. 2. Decreased pleural effusions. Residual potentially loculated fluid along the right mid to lower chest wall, within the right minor fissure, and left apex. Electronically Signed   By: Placido Sou M.D.   On: 09/19/2022 21:29   DG Chest 2 View  Result Date: 09/19/2022 CLINICAL DATA:  Shortness of breath, missed dialysis EXAM: CHEST - 2 VIEW COMPARISON:  08/12/2022 FINDINGS: Cardiomegaly and vascular pedicle widening. Diffuse hazy appearance of the chest with new pleural fluid, some of which appears fissural and loculated. diffuse interstitial opacity. IMPRESSION: Pulmonary edema and pleural effusions since prior. Some of the pleural fluid appears loculated. Electronically Signed   By: Jorje Guild M.D.   On: 09/19/2022 08:10  Microbiology: Results for orders placed or  performed during the hospital encounter of 09/19/22  Resp panel by RT-PCR (RSV, Flu A&B, Covid) Anterior Nasal Swab     Status: None   Collection Time: 09/19/22  6:46 AM   Specimen: Anterior Nasal Swab  Result Value Ref Range Status   SARS Coronavirus 2 by RT PCR NEGATIVE NEGATIVE Final    Comment: (NOTE) SARS-CoV-2 target nucleic acids are NOT DETECTED.  The SARS-CoV-2 RNA is generally detectable in upper respiratory specimens during the acute phase of infection. The lowest concentration of SARS-CoV-2 viral copies this assay can detect is 138 copies/mL. A negative result does not preclude SARS-Cov-2 infection and should not be used as the sole basis for treatment or other patient management decisions. A negative result may occur with  improper specimen collection/handling, submission of specimen other than nasopharyngeal swab, presence of viral mutation(s) within the areas targeted by this assay, and inadequate number of viral copies(<138 copies/mL). A negative result must be combined with clinical observations, patient history, and epidemiological information. The expected result is Negative.  Fact Sheet for Patients:  EntrepreneurPulse.com.au  Fact Sheet for Healthcare Providers:  IncredibleEmployment.be  This test is no t yet approved or cleared by the [place] FDA and  has been authorized for detection and/or diagnosis of SARS-CoV-2 by FDA under an Emergency Use Authorization (EUA). This EUA will remain  in effect (meaning this test can be used) for the duration of the COVID-19 declaration under Section 564(b)(1) of the Act, 21 U.S.C.section 360bbb-3(b)(1), unless the authorization is terminated  or revoked sooner.       Influenza A by PCR NEGATIVE NEGATIVE Final   Influenza B by PCR NEGATIVE NEGATIVE Final    Comment: (NOTE) The Xpert Xpress SARS-CoV-2/FLU/RSV plus assay is intended as an aid in the diagnosis of influenza from  Nasopharyngeal swab specimens and should not be used as a sole basis for treatment. Nasal washings and aspirates are unacceptable for Xpert Xpress SARS-CoV-2/FLU/RSV testing.  Fact Sheet for Patients: EntrepreneurPulse.com.au  Fact Sheet for Healthcare Providers: IncredibleEmployment.be  This test is not yet approved or cleared by the [place] FDA and has been authorized for detection and/or diagnosis of SARS-CoV-2 by FDA under an Emergency Use Authorization (EUA). This EUA will remain in effect (meaning this test can be used) for the duration of the COVID-19 declaration under Section 564(b)(1) of the Act, 21 U.S.C. section 360bbb-3(b)(1), unless the authorization is terminated or revoked.     Resp Syncytial Virus by PCR NEGATIVE NEGATIVE Final    Comment: (NOTE) Fact Sheet for Patients: EntrepreneurPulse.com.au  Fact Sheet for Healthcare Providers: IncredibleEmployment.be  This test is not yet approved or cleared by the [place] FDA and has been authorized for detection and/or diagnosis of SARS-CoV-2 by FDA under an Emergency Use Authorization (EUA). This EUA will remain in effect (meaning this test can be used) for the duration of the COVID-19 declaration under Section 564(b)(1) of the Act, 21 U.S.C. section 360bbb-3(b)(1), unless the authorization is terminated or revoked.  Performed at [facility], [place] [address], [place], [place] [address]   Respiratory (~20 pathogens) panel by PCR     Status: None   Collection Time: 09/19/22  9:36 PM   Specimen: Nasopharyngeal Swab; Respiratory  Result Value Ref Range Status   Adenovirus NOT DETECTED NOT DETECTED Final   Coronavirus 229E NOT DETECTED NOT DETECTED Final    Comment: (NOTE) The Coronavirus on the Respiratory Panel, DOES NOT test for the  novel  Coronavirus (2019 nCoV)    Coronavirus HKU1 NOT DETECTED NOT DETECTED Final    Coronavirus NL63 NOT DETECTED NOT DETECTED Final   Coronavirus OC43 NOT DETECTED NOT DETECTED Final   Metapneumovirus NOT DETECTED NOT DETECTED Final   Rhinovirus / Enterovirus NOT DETECTED NOT DETECTED Final   Influenza A NOT DETECTED NOT DETECTED Final   Influenza B NOT DETECTED NOT DETECTED Final   Parainfluenza Virus 1 NOT DETECTED NOT DETECTED Final   Parainfluenza Virus 2 NOT DETECTED NOT DETECTED Final   Parainfluenza Virus 3 NOT DETECTED NOT DETECTED Final   Parainfluenza Virus 4 NOT DETECTED NOT DETECTED Final   Respiratory Syncytial Virus NOT DETECTED NOT DETECTED Final   Bordetella pertussis NOT DETECTED NOT DETECTED Final   Bordetella Parapertussis NOT DETECTED NOT DETECTED Final   Chlamydophila pneumoniae NOT DETECTED NOT DETECTED Final   Mycoplasma pneumoniae NOT DETECTED NOT DETECTED Final    Comment: Performed at [facility], [place] [address], [place], [place] [address]    Labs: CBC: Recent Labs  Lab 09/19/22 [phone] 09/19/22 2227 09/20/22 0420 09/21/22 0936  WBC 9.7 8.0 7.4 6.0  HGB 11.6* 10.9* 11.0* 10.8*  HCT 38.4* 35.4* 36.0* 33.7*  MCV 94.6 93.9 93.3 90.1  PLT 228 [phone]   Basic Metabolic Panel: Recent Labs  Lab 09/19/22 0651 09/19/22 1013 09/19/22 2227 09/20/22 0420 09/21/22 0936  NA 140 137  --  135 134*  K 6.7* 5.9*  --  5.3* 5.0  CL 97* 97*  --  94* 94*  CO2 25 23  --  25 23  GLUCOSE 101* 94  --  81 76  BUN 90* 91*  --  55* 73*  CREATININE 15.40* 15.54* 10.34* 11.02* 13.16*  CALCIUM 9.2 8.8*  --  8.9 8.8*  PHOS  --   --   --   --  8.2*   Liver Function Tests: Recent Labs  Lab 09/19/22 0651 09/20/22 0420 09/21/22 0936  AST 19 16  --   ALT 5 7  --   ALKPHOS 76 67  --   BILITOT 0.8 0.8  --   PROT 7.7 7.4  --   ALBUMIN 3.1* 2.9* 3.0*   CBG: Recent Labs  Lab 09/19/22 1144 09/19/22 2116 09/20/22 1142 09/21/22 0541  GLUCAP 70 83 89 96    Discharge time spent: 35 minutes.  Signed: Cordelia Poche, MD [facility] 09/21/2022

## 2022-09-21 NOTE — Progress Notes (Signed)
Taxi voucher given 

## 2022-09-21 NOTE — Progress Notes (Signed)
Mobility Specialist Progress Note:   09/21/22 0924  Mobility  Activity Off unit   Pt in HD. Will follow-up as time allows.   Gareth Eagle Rin Gorton Mobility Specialist Please contact via [employer] or  Rehab Office at [phone]

## 2022-09-21 NOTE — Progress Notes (Signed)
09/21/2022  [name] DOB: 1941/07/24 MRN: [id number]   RIDER WAIVER AND RELEASE OF LIABILITY  For the purposes of helping with transportation needs, [facility] partners with outside transportation providers (taxi companies, [place], Social research officer, government.) to give [employer] patients or other approved people the choice of on-demand rides [employer]") to our buildings for non-emergency visits.  By using [employer], I, the person signing this document, on behalf of myself and/or any legal minors (in my care using the [employer]), agree:  Government social research officer given to me are supplied by independent, outside transportation providers who do not work for, or have any affiliation with, [employer]. [facility] is not a transportation company. [facility] has no control over the quality or safety of the rides I get using [employer].  has no control over whether any outside ride will happen on time or not.  gives no guarantee on the reliability, quality, safety, or availability on any rides, or that no mistakes will happen. I know and accept that traveling by vehicle (car, truck, SVU, Lucianne Lei, bus, taxi, etc.) has risks of serious injuries such as disability, being paralyzed, and death. I know and agree the risk of using [employer] is mine alone, and not [employer]. Transport Services are provided "as is" and as are available. The transportation providers are in charge for all inspections and care of the vehicles used to provide these rides. I agree not to take legal action against [facility], its agents, employees, officers, directors, representatives, insurers, attorneys, assigns, successors, subsidiaries, and affiliates at any time for any reasons related directly or indirectly to using [employer]. I also agree not to take legal action against  or its affiliates for any injury, death, or damage to property caused by or related to using  [employer]. I have read this Waiver and Release of Liability, and I understand the terms used in it and their legal meaning. This Waiver is freely and voluntarily given with the understanding that my right (or any legal minors) to legal action against [facility] relating to [employer] is knowingly given up to use these services.   I attest that I read the Ride Waiver and Release of Liability to [name], gave Mr. Kapur the opportunity to ask questions and answered the questions asked (if any). I affirm that [name] then provided consent for assistance with transportation.

## 2022-09-22 ENCOUNTER — Encounter (HOSPITAL_COMMUNITY): Payer: Self-pay | Admitting: Pharmacist Clinician (PhC)/ Clinical Pharmacy Specialist

## 2022-09-23 ENCOUNTER — Telehealth: Payer: Self-pay | Admitting: Nephrology

## 2022-09-23 NOTE — Telephone Encounter (Signed)
Transition of care contact from inpatient facility  Date of discharge: 09/21/22 Date of contact: 09/23/22 Method: Phone Spoke to: Patient's  caregiver + daughter   Patient contacted to discuss transition of care from recent inpatient hospitalization. Patient was admitted to [facility] from 09/19/22 to 09/21/22 ... with discharge diagnosis of Chronic Systolic and diastolic Heart failure with volume overload, hyperkalemia ...  Medication changes were reviewed review  with daughter told dc amlodipine , for now  refilled coreg  holding am of hd ( on midodrine pre hd only )   Patient will follow up with his/her outpatient HD unit on 09/24/22

## 2022-09-27 NOTE — Progress Notes (Unsigned)
Cardiology Office Note:    Date:  09/27/2022   ID:  Travis [name], DOB [birth year], MRN [id number]  PCP:  [name], [place] Prov[facility] Cardiologist:  Freada Bergeron, MD Cardiology APP:  [facility] [place] {   Referring MD: [name], MD     History of Present Illness:    Travis [name] is a 82 y.o. male with a hx of ESRD (on M,W,F hemodialysis schedule), chronic diastolic heart failure, pulmonary hypertension, GERD, DVT of RLE (s/p IVC filter, not on [place] 2/2 perinephric hematoma), anemia of chronic disease, COPD who presents to clinic for follow-up.  Patient was admitted to [facility] in 06/2022 for chest pain with trops to 100s. LHC/RHC 07/06/22 showed 99% D1, 60% D2, 95% D3, 60% OM1. RAP 66mmHg, PASP 12mmHg, PCWP 53mmHg, CO 8.22 by Fick and 5.37 by thermal (discrepancy related to AV fistula). TTE 07/06/22 with LVEF 45%, G2DD, normal RV, mild MR, mild AR, mild AS. Was recommended for medical management.  Readmitted 09/2022 for worsening SOB in the setting of missing HD. Symptoms resolved with HD and he was discharged home.  Today, ***  Past Medical History:  Diagnosis Date   Anemia    Arthritis    Chronic combined systolic and diastolic CHF (congestive heart failure) ([place])    a. Etiology of low EF not defined.   DVT (deep venous thrombosis) ([place])    a. 01/2015: RLE DVT. VQ scan intermediate probability. Underwent renal bx complicated by perinephric hematoma; anticoagulation stopped and IVC filter placed.   ESRD on hemodialysis ([place]) 02/2015   a. 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 Received plasma exchange and steroids but didn't respond and ended up starting hemodialysis June 2016.    Essential hypertension    GERD (gastroesophageal reflux disease)    History of nuclear stress test    Myoview 9/19 [facility]): low risk    HTN (hypertension)    Hypoalbuminemia    Hypoglycemia    NSTEMI  (non-ST elevated myocardial infarction) ([place]) 04/18/2015   OA (osteoarthritis) of knee    Perinephric hematoma 01/2015   Proctitis 07/2015   Protein calorie malnutrition ([place])    Pulmonary fibrosis ([place])    Sjogren's disease ([place]) 01/28/2015    Past Surgical History:  Procedure Laterality Date   AV FISTULA PLACEMENT Right 02/17/2015   Procedure: INSERTION OF RIGHT ARM  ARTERIOVENOUS (AV) GORE-TEX GRAFT ;  Surgeon: Elam Dutch, MD;  Location: [place];  Service: Vascular;  Laterality: Right;   FLEXIBLE SIGMOIDOSCOPY N/A 08/04/2015   Procedure: FLEXIBLE SIGMOIDOSCOPY;  Surgeon: Wilford Corner, MD;  Location: [place];  Service: Endoscopy;  Laterality: N/A;   HEMORROIDECTOMY  1999   INSERTION OF DIALYSIS CATHETER N/A 02/17/2015   Procedure: INSERTION OF DIALYSIS CATHETER RIGHT INTERNAL JUGULAR VEIN;  Surgeon: Elam Dutch, MD;  Location: [place];  Service: Vascular;  Laterality: N/A;   MULTIPLE TOOTH EXTRACTIONS     PERIPHERAL VASCULAR BALLOON ANGIOPLASTY Right 01/23/2019   Procedure: PERIPHERAL VASCULAR BALLOON ANGIOPLASTY;  Surgeon: Elam Dutch, MD;  Location: [place] [facility];  Service: Cardiovascular;  Laterality: Right;   REVISION OF ARTERIOVENOUS GORETEX GRAFT Right 03/29/2017   Procedure: REVISION OF ARTERIOVENOUS GORETEX GRAFT;  Surgeon: Conrad Ducktown, MD;  Location: [place];  Service: Vascular;  Laterality: Right;   RIGHT/LEFT HEART CATH AND CORONARY ANGIOGRAPHY N/A 07/06/2022   Procedure: RIGHT/LEFT HEART CATH AND CORONARY ANGIOGRAPHY;  Surgeon: Glenetta Hew  W, MD;  Location: [place] [facility];  Service: Cardiovascular;  Laterality: N/A;   Surgical procedure to remove a mole as a child Right Eye area   At around 10 years old    Current Medications: No outpatient medications have been marked as taking for the 09/28/22 encounter (Appointment) with Freada Bergeron, MD.     Allergies:   Patient has no known allergies.   Social History   Socioeconomic History    Marital status: Married    Spouse name: Not on file   Number of children: Not on file   Years of education: Not on file   Highest education level: Not on file  Occupational History   Not on file  Tobacco Use   Smoking status: Never   Smokeless tobacco: Never  Vaping Use   Vaping Use: Never used  Substance and Sexual Activity   Alcohol use: No    Alcohol/week: 0.0 standard drinks of alcohol   Drug use: No   Sexual activity: Not Currently  Other Topics Concern   Not on file  Social History Narrative   Not on file   Social Determinants of Health   Financial Resource Strain: Not on file  Food Insecurity: No Food Insecurity (09/20/2022)   Hunger Vital Sign    Worried About Running Out of Food in the Last Year: Never true    Ran Out of Food in the Last Year: Never true  Transportation Needs: No Transportation Needs (09/20/2022)   PRAPARE - Hydrologist (Medical): No    Lack of Transportation (Non-Medical): No  Physical Activity: Not on file  Stress: Not on file  Social Connections: Not on file     Family History: The patient's family history includes Healthy in his father; Hypertension in his brother, mother, and sister.  ROS:   Please see the history of present illness.    *** All other systems reviewed and are negative.  EKGs/Labs/Other Studies Reviewed:    The following studies were reviewed today: RHC/LHC 06/2022:    Heavily calcified diffusely diseased vessel: 1st Diag-1 lesion is 99% stenosed. 1st Diag-2 lesion is 60% stenosed. 1st Diag-3 lesion is 95% stenosed.   The left ventricular systolic function is normal. The left ventricular ejection fraction is 50-55% by visual estimate.   LV end diastolic pressure is moderately elevated.   Hemodynamic findings consistent with mild pulmonary hypertension.   POSTOP DIAGNOSES Moderate-severe 2-vessel single-vessel disease: Proximal D1-99%, mid 60%, distal 95% (heavily calcified/diffusely  diseased; not favorable for PCI would require atherectomy and a relatively small caliber vessel) &  60% ostial sidebranch of OM1 followed by a 60% mid vessel then distal occlusion (with right to left collaterals). Otherwise mild diffuse disease/calcified vessels Mostly preserved LVEF, cannot exclude anterolateral hypokinesis.   LVEDP moderately elevated at 19 mmHg (LVEDP 166/5 mmHg) with a PCWP of ~22 mmHg. RHC numbers:  Mean RAP 6 mmHg, RVP-EDP 51/0-5 mmHg;  PAP-mean 50/13-27 mmHg; PCWP 17-26 mmHg (~22 mmHg);  Ao sat 99%, PA sat 74%.   CO-CI: Fick -> 8.22, 4.44; thermal 5.37-2.7 (suspect that the discrepancy is related to AV fistula)     RECOMMENDATIONS Would recommend medical management for heavily diseased D1 branch.   This is heavily calcified vessel with diffuse disease, will require atherectomy and extensive stent placement in the vessel is roughly 2 to 2.25 mm distally.  Not favorable for PCI. Consider slightly increased volume removal and HD based on elevated filling pressures. Return to  nursing for ongoing care  TTE 06/2022: IMPRESSIONS     1. Left ventricular ejection fraction, by estimation, is 45 to 50%. Left  ventricular ejection fraction by 2D MOD biplane is 47.4 %. The left  ventricle has mildly decreased function. The left ventricle has no  regional wall motion abnormalities. There is  moderate asymmetric left ventricular hypertrophy of the infero-lateral  segment. Left ventricular diastolic parameters are consistent with Grade  II diastolic dysfunction (pseudonormalization).   2. Right ventricular systolic function is normal. The right ventricular  size is normal. Tricuspid regurgitation signal is inadequate for assessing  PA pressure.   3. The mitral valve is grossly normal. Mild mitral valve regurgitation.  No evidence of mitral stenosis.   4. The aortic valve is tricuspid. There is moderate calcification of the  aortic valve. There is moderate thickening of the  aortic valve. Aortic  valve regurgitation is mild. Mild aortic valve stenosis.   5. The inferior vena cava is normal in size with greater than 50%  respiratory variability, suggesting right atrial pressure of 3 mmHg.      EKG:  EKG is *** ordered today.  The ekg ordered today demonstrates ***  Recent Labs: 08/12/2022: Magnesium 2.1 09/19/2022: B Natriuretic Peptide 1,977.6 09/20/2022: ALT 7 09/21/2022: BUN 73; Creatinine, Ser 13.16; Hemoglobin 10.8; Platelets 209; Potassium 5.0; Sodium 134  Recent Lipid Panel    Component Value Date/Time   CHOL 153 07/15/2022 0755   TRIG 115 07/15/2022 0755   HDL 55 07/15/2022 0755   CHOLHDL 2.8 07/15/2022 0755   CHOLHDL 2.7 09/09/2016 1551   VLDL 38 (H) 09/09/2016 1551   LDLCALC 77 07/15/2022 0755     Risk Assessment/Calculations:   {Does this patient have ATRIAL FIBRILLATION?[phone]}  No BP recorded.  {Refresh Note OR Click here to enter BP  :1}***         Physical Exam:    VS:  There were no vitals taken for this visit.    Wt Readings from Last 3 Encounters:  09/21/22 160 lb 7.9 oz (72.8 kg)  08/12/22 165 lb (74.8 kg)  08/04/22 149 lb 4 oz (67.7 kg)     GEN: *** Well nourished, well developed in no acute distress HEENT: Normal NECK: No JVD; No carotid bruits LYMPHATICS: No lymphadenopathy CARDIAC: ***RRR, no murmurs, rubs, gallops RESPIRATORY:  Clear to auscultation without rales, wheezing or rhonchi  ABDOMEN: Soft, non-tender, non-distended MUSCULOSKELETAL:  No edema; No deformity  SKIN: Warm and dry NEUROLOGIC:  Alert and oriented x 3 PSYCHIATRIC:  Normal affect   ASSESSMENT:    No diagnosis found. PLAN:    In order of problems listed above:  #Coronary Artery Disease: Cath 06/2022 with 99% D1, 60% D2, 95% D3, 60% OM1. TTE with LVEF 50-55%. Was recommended for continued medical management. Currently, *** -Continue ASA 81mg  daily -Continue lipitor 40mg  daily -Continue coreg 12.5mg  BID  #Chronic Diastolic HF: TTE  16/6063 with LVFE 50-55%, G2DD, RV okay, mild AS, mild AR. On HD for volume management. -Continue HD for volume management -No spiro/SGLT2i due to ESRD  #HTN: -Continue amlodipine 10 mg daily, carvedilol 12.5 mg BID, losartan 25 mg daily   #ESRD on HD: -Continue scheduled HD  #Mild AS: #Mild AR: -Continue serial montioring with next TTE 06/2024 unless clinical change      {Are you ordering a CV Procedure (e.g. stress test, cath, DCCV, TEE, etc)?   Press F2        :[id number]}    Medication Adjustments/Labs and  Tests Ordered: Current medicines are reviewed at length with the patient today.  Concerns regarding medicines are outlined above.  No orders of the defined types were placed in this encounter.  No orders of the defined types were placed in this encounter.   There are no Patient Instructions on file for this visit.   Signed, Freada Bergeron, MD  09/27/2022 12:36 PM    [place]

## 2022-09-28 ENCOUNTER — Ambulatory Visit: Payer: Medicare Other | Attending: Nurse Practitioner | Admitting: Cardiology

## 2022-09-28 VITALS — BP 152/84 | HR 67 | Ht 70.0 in | Wt 156.4 lb

## 2022-09-28 DIAGNOSIS — I10 Essential (primary) hypertension: Secondary | ICD-10-CM | POA: Diagnosis not present

## 2022-09-28 DIAGNOSIS — I214 Non-ST elevation (NSTEMI) myocardial infarction: Secondary | ICD-10-CM | POA: Diagnosis not present

## 2022-09-28 DIAGNOSIS — E785 Hyperlipidemia, unspecified: Secondary | ICD-10-CM | POA: Diagnosis not present

## 2022-09-28 DIAGNOSIS — N186 End stage renal disease: Secondary | ICD-10-CM

## 2022-09-28 DIAGNOSIS — Z992 Dependence on renal dialysis: Secondary | ICD-10-CM | POA: Diagnosis not present

## 2022-09-28 DIAGNOSIS — I5032 Chronic diastolic (congestive) heart failure: Secondary | ICD-10-CM | POA: Diagnosis not present

## 2022-09-28 DIAGNOSIS — I350 Nonrheumatic aortic (valve) stenosis: Secondary | ICD-10-CM

## 2022-09-28 DIAGNOSIS — R072 Precordial pain: Secondary | ICD-10-CM

## 2022-09-28 DIAGNOSIS — I25118 Atherosclerotic heart disease of native coronary artery with other forms of angina pectoris: Secondary | ICD-10-CM

## 2022-09-28 MED ORDER — MIDODRINE HCL 10 MG PO TABS: 10.0000 mg | ORAL_TABLET | ORAL | 1 refills | Status: DC

## 2022-09-28 MED ORDER — CARVEDILOL 12.5 MG PO TABS: 12.5000 mg | ORAL_TABLET | Freq: Two times a day (BID) | ORAL | 2 refills | Status: DC

## 2022-09-28 MED ORDER — AMLODIPINE BESYLATE 10 MG PO TABS: 10.0000 mg | ORAL_TABLET | Freq: Every day | ORAL | 2 refills | Status: DC

## 2022-09-28 NOTE — Patient Instructions (Signed)
Medication Instructions:   Your physician recommends that you continue on your current medications as directed. Please refer to the Current Medication list given to you today.  *If you need a refill on your cardiac medications before your next appointment, please call your pharmacy*   Lab Work:  IN 3 MONTHS HERE IN THE OFFICE--SAME DAY AS YOU SEE DR. Jacolyn Reedy PHYSICIAN ASSISTANT--CHECK LIPIDS AT THAT TIME--PLEASE COME FASTING TO THIS LAB APPOINTMENT  If you have labs (blood work) drawn today and your tests are completely normal, you will receive your results only by: [place] (if you have MyChart) OR A paper copy in the mail If you have any lab test that is abnormal or we need to change your treatment, we will call you to review the results.   Follow-Up:  3 MONTHS WITH AN EXTENDER IN THE OFFICE--DO LIPIDS SAME DAY--PLEASE SCHEDULE WITH AN EXTENDER PER DR. Johney Frame   Other Instructions ---PLEASE BRING YOUR BLOOD PRESSURE RECORDINGS WITH YOU TO YOUR 3 MONTHS FOLLOW-UP APPOINTMENT, AS DIRECTED BY DR. Johney Frame TODAY  ---PLEASE SEND AN UPDATED MEDICATION LIST THROUGH MYCHART OR DROP THIS OFF TO THE FRONT DESK

## 2022-09-28 NOTE — Progress Notes (Signed)
Cardiology Office Note:    Date:  09/28/2022   ID:  Travis [name], DOB 02-08-1941, MRN [id number]  PCP:  [name], [place] Providers Cardiologist:  Freada Bergeron, MD Cardiology APP:  Sharmon Revere  {   Referring MD: [name], MD     History of Present Illness:    Travis [name] is a 82 y.o. male with a hx of ESRD (on M,W,F hemodialysis schedule), chronic diastolic heart failure, pulmonary hypertension, GERD, DVT of RLE (s/p IVC filter, not on [place]er 2/2 perinephric hematoma), anemia of chronic disease, COPD who presents to clinic for follow-up.  Patient was admitted to [facility] in 06/2022 for chest pain with trops to 100s. LHC/RHC 07/06/22 showed 99% D1, 60% D2, 95% D3, 60% OM1. RAP 72mmHg, PASP 51mmHg, PCWP 54mmHg, CO 8.22 by Fick and 5.37 by thermal (discrepancy related to AV fistula). TTE 07/06/22 with LVEF 45%, G2DD, normal RV, mild MR, mild AR, mild AS. Was recommended for medical management.  Readmitted 09/2022 for worsening SOB in the setting of missing HD. Symptoms resolved with HD and he was discharged home.  Today, the patient presents with his son to help provide history. He states that he has been feeling better since his recent ED admission. Previous to the admission missed a day of dialysis and had been drinking soda which he believes to be the cause. His symptoms resolved without HD. Feels much better today.   His blood pressure at home has been around 130, but at dialysis he has had episodes as low as 40'J systolic. He takes midodrine on his dialysis days; he is unsure if it mediates his symptoms. We discussed other possible strategies to lower his blood pressure variance.  He had high cholesterol previously and his medication was adjusted, he has not done repeat labs since.  He denies any palpitations, shortness of breath, or peripheral edema. No lightheadedness, headaches, syncope, orthopnea, or PND.   Past Medical History:   Diagnosis Date   Anemia    Arthritis    Chronic combined systolic and diastolic CHF (congestive heart failure) ([place])    a. Etiology of low EF not defined.   DVT (deep venous thrombosis) ([place])    a. 01/2015: RLE DVT. VQ scan intermediate probability. Underwent renal bx complicated by perinephric hematoma; anticoagulation stopped and IVC filter placed.   ESRD on hemodialysis ([place]) 02/2015   a. 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 Received plasma exchange and steroids but didn't respond and ended up starting hemodialysis June 2016.    Essential hypertension    GERD (gastroesophageal reflux disease)    History of nuclear stress test    Myoview 9/19 [facility]): low risk    HTN (hypertension)    Hypoalbuminemia    Hypoglycemia    NSTEMI (non-ST elevated myocardial infarction) ([place]) 04/18/2015   OA (osteoarthritis) of knee    Perinephric hematoma 01/2015   Proctitis 07/2015   Protein calorie malnutrition ([place])    Pulmonary fibrosis ([place])    Sjogren's disease ([place]) 01/28/2015    Past Surgical History:  Procedure Laterality Date   AV FISTULA PLACEMENT Right 02/17/2015   Procedure: INSERTION OF RIGHT ARM  ARTERIOVENOUS (AV) GORE-TEX GRAFT ;  Surgeon: Elam Dutch, MD;  Location: [place];  Service: Vascular;  Laterality: Right;   FLEXIBLE SIGMOIDOSCOPY N/A 08/04/2015   Procedure: FLEXIBLE SIGMOIDOSCOPY;  Surgeon: Wilford Corner, MD;  Location: [place];  Service: Endoscopy;  Laterality: N/A;   HEMORROIDECTOMY  1999   INSERTION OF DIALYSIS CATHETER N/A 02/17/2015   Procedure: INSERTION OF DIALYSIS CATHETER RIGHT INTERNAL JUGULAR VEIN;  Surgeon: Elam Dutch, MD;  Location: [place];  Service: Vascular;  Laterality: N/A;   MULTIPLE TOOTH EXTRACTIONS     PERIPHERAL VASCULAR BALLOON ANGIOPLASTY Right 01/23/2019   Procedure: PERIPHERAL VASCULAR BALLOON ANGIOPLASTY;  Surgeon: Elam Dutch, MD;  Location: [place] CV [facility];  Service:  Cardiovascular;  Laterality: Right;   REVISION OF ARTERIOVENOUS GORETEX GRAFT Right 03/29/2017   Procedure: REVISION OF ARTERIOVENOUS GORETEX GRAFT;  Surgeon: Conrad Silver Firs, MD;  Location: [place];  Service: Vascular;  Laterality: Right;   RIGHT/LEFT HEART CATH AND CORONARY ANGIOGRAPHY N/A 07/06/2022   Procedure: RIGHT/LEFT HEART CATH AND CORONARY ANGIOGRAPHY;  Surgeon: Leonie Man, MD;  Location: [place] [facility];  Service: Cardiovascular;  Laterality: N/A;   Surgical procedure to remove a mole as a child Right Eye area   At around 63 years old    Current Medications: Current Meds  Medication Sig   Accu-Chek Softclix Lancets lancets Use to test blood glucose four times daily as directed.   acetaminophen (TYLENOL) 500 MG tablet Take 500 mg by mouth every 6 (six) hours as needed for moderate pain.   albuterol (VENTOLIN HFA) 108 (90 Base) MCG/ACT inhaler Inhale 2 puffs into the lungs every 6 (six) hours as needed for wheezing or shortness of breath.   aspirin EC 81 MG EC tablet Take 1 tablet (81 mg total) by mouth daily.   atorvastatin (LIPITOR) 40 MG tablet Take 1 tablet (40 mg total) by mouth daily at 6 PM.   Blood Glucose Monitoring Suppl (BLOOD GLUCOSE MONITOR SYSTEM) w/Device KIT Use up to four times daily as directed.   Brinzolamide-Brimonidine 1-0.2 % SUSP Place 1 drop into both eyes in the morning, at noon, and at bedtime.   calcitRIOL (ROCALTROL) 0.25 MCG capsule Take 13 capsules (3.25 mcg total) by mouth every Monday, Wednesday, and Friday with hemodialysis.   cinacalcet (SENSIPAR) 30 MG tablet Take 3 tablets (90 mg total) by mouth every Monday, Wednesday, and Friday with hemodialysis.   ferric gluconate 62.5 mg in sodium chloride 0.9 % 100 mL Inject 62.5 mg into the vein every Wednesday with hemodialysis.   gabapentin (NEURONTIN) 300 MG capsule Take 300 mg by mouth daily.   glucose blood test strip Use to test blood glucose four times daily as directed   latanoprost (XALATAN)  0.005 % ophthalmic solution Place 1 drop into both eyes at bedtime.   nitroGLYCERIN (NITROSTAT) 0.4 MG SL tablet Place 1 tablet (0.4 mg total) under the tongue every 5 (five) minutes as needed for chest pain (for total of 3 doses at the most).   omeprazole (PRILOSEC) 40 MG capsule Take 40 mg by mouth daily.   sevelamer carbonate (RENVELA) 800 MG tablet Take 2 tablets (1,600 mg total) by mouth 3 (three) times daily with meals.   timolol (TIMOPTIC-XR) 0.5 % ophthalmic gel-forming Place 1 drop into both eyes daily.   [DISCONTINUED] amLODipine (NORVASC) 10 MG tablet Take 1 tablet (10 mg total) by mouth daily.   [DISCONTINUED] carvedilol (COREG) 12.5 MG tablet Take 1 tablet (12.5 mg total) by mouth 2 (two) times daily with a meal.   [DISCONTINUED] midodrine (PROAMATINE) 10 MG tablet Take 10 mg by mouth every Monday, Wednesday, and Friday. Take 10 mg (1 tablet) by mouth before dialysis and 10 mg (1 tablet) at dialysis as directed.  Allergies:   Patient has no known allergies.   Social History   Socioeconomic History   Marital status: Married    Spouse name: Not on file   Number of children: Not on file   Years of education: Not on file   Highest education level: Not on file  Occupational History   Not on file  Tobacco Use   Smoking status: Never   Smokeless tobacco: Never  Vaping Use   Vaping Use: Never used  Substance and Sexual Activity   Alcohol use: No    Alcohol/week: 0.0 standard drinks of alcohol   Drug use: No   Sexual activity: Not Currently  Other Topics Concern   Not on file  Social History Narrative   Not on file   Social Determinants of Health   Financial Resource Strain: Not on file  Food Insecurity: No Food Insecurity (09/20/2022)   Hunger Vital Sign    Worried About Running Out of Food in the Last Year: Never true    Ran Out of Food in the Last Year: Never true  Transportation Needs: No Transportation Needs (09/20/2022)   PRAPARE - Radiographer, therapeutic (Medical): No    Lack of Transportation (Non-Medical): No  Physical Activity: Not on file  Stress: Not on file  Social Connections: Not on file     Family History: The patient's family history includes Healthy in his father; Hypertension in his brother, mother, and sister.  ROS:   Please see the history of present illness.   Review of Systems  Constitutional:  Negative for chills and fever.  HENT:  Negative for nosebleeds and tinnitus.   Eyes:  Negative for blurred vision and pain.  Respiratory:  Negative for cough, hemoptysis, shortness of breath and stridor.   Cardiovascular:  Positive for chest pain (only one episode when not on dialysis). Negative for palpitations, orthopnea, claudication, leg swelling and PND.  Gastrointestinal:  Negative for blood in stool, diarrhea, nausea and vomiting.  Genitourinary:  Negative for dysuria and hematuria.  Musculoskeletal:  Negative for falls.  Neurological:  Negative for dizziness, loss of consciousness and headaches.  Psychiatric/Behavioral:  Negative for depression, hallucinations and substance abuse. The patient does not have insomnia.     All other systems reviewed and are negative.  EKGs/Labs/Other Studies Reviewed:    The following studies were reviewed today: RHC/LHC 06/2022:    Heavily calcified diffusely diseased vessel: 1st Diag-1 lesion is 99% stenosed. 1st Diag-2 lesion is 60% stenosed. 1st Diag-3 lesion is 95% stenosed.   The left ventricular systolic function is normal. The left ventricular ejection fraction is 50-55% by visual estimate.   LV end diastolic pressure is moderately elevated.   Hemodynamic findings consistent with mild pulmonary hypertension.   POSTOP DIAGNOSES Moderate-severe 2-vessel single-vessel disease: Proximal D1-99%, mid 60%, distal 95% (heavily calcified/diffusely diseased; not favorable for PCI would require atherectomy and a relatively small caliber vessel) &  60% ostial sidebranch of  OM1 followed by a 60% mid vessel then distal occlusion (with right to left collaterals). Otherwise mild diffuse disease/calcified vessels Mostly preserved LVEF, cannot exclude anterolateral hypokinesis.   LVEDP moderately elevated at 19 mmHg (LVEDP 166/5 mmHg) with a PCWP of ~22 mmHg. RHC numbers:  Mean RAP 6 mmHg, RVP-EDP 51/0-5 mmHg;  PAP-mean 50/13-27 mmHg; PCWP 17-26 mmHg (~22 mmHg);  Ao sat 99%, PA sat 74%.   CO-CI: Fick -> 8.22, 4.44; thermal 5.37-2.7 (suspect that the discrepancy is related to AV fistula)  RECOMMENDATIONS Would recommend medical management for heavily diseased D1 branch.   This is heavily calcified vessel with diffuse disease, will require atherectomy and extensive stent placement in the vessel is roughly 2 to 2.25 mm distally.  Not favorable for PCI. Consider slightly increased volume removal and HD based on elevated filling pressures. Return to nursing for ongoing care  TTE 06/2022: IMPRESSIONS     1. Left ventricular ejection fraction, by estimation, is 45 to 50%. Left  ventricular ejection fraction by 2D MOD biplane is 47.4 %. The left  ventricle has mildly decreased function. The left ventricle has no  regional wall motion abnormalities. There is  moderate asymmetric left ventricular hypertrophy of the infero-lateral  segment. Left ventricular diastolic parameters are consistent with Grade  II diastolic dysfunction (pseudonormalization).   2. Right ventricular systolic function is normal. The right ventricular  size is normal. Tricuspid regurgitation signal is inadequate for assessing  PA pressure.   3. The mitral valve is grossly normal. Mild mitral valve regurgitation.  No evidence of mitral stenosis.   4. The aortic valve is tricuspid. There is moderate calcification of the  aortic valve. There is moderate thickening of the aortic valve. Aortic  valve regurgitation is mild. Mild aortic valve stenosis.   5. The inferior vena cava is normal in size  with greater than 50%  respiratory variability, suggesting right atrial pressure of 3 mmHg.      EKG:  The EKG is personally reviewed 09/28/2022: NSR with first degree AVB, LVH with repol, HR 63  Recent Labs: 08/12/2022: Magnesium 2.1 09/19/2022: B Natriuretic Peptide 1,977.6 09/20/2022: ALT 7 09/21/2022: BUN 73; Creatinine, Ser 13.16; Hemoglobin 10.8; Platelets 209; Potassium 5.0; Sodium 134  Recent Lipid Panel    Component Value Date/Time   CHOL 153 07/15/2022 0755   TRIG 115 07/15/2022 0755   HDL 55 07/15/2022 0755   CHOLHDL 2.8 07/15/2022 0755   CHOLHDL 2.7 09/09/2016 1551   VLDL 38 (H) 09/09/2016 1551   LDLCALC 77 07/15/2022 0755     Risk Assessment/Calculations:            Physical Exam:    VS:  BP (!) 152/84   Pulse 67   Ht 5\' 10"  (1.778 m)   Wt 156 lb 6.4 oz (70.9 kg)   BMI 22.44 kg/m     Wt Readings from Last 3 Encounters:  09/28/22 156 lb 6.4 oz (70.9 kg)  09/21/22 160 lb 7.9 oz (72.8 kg)  08/12/22 165 lb (74.8 kg)     GEN:  Elderly, comfortable, in wheelchair. HEENT: Normal NECK: No JVD; No carotid bruits CARDIAC: RRR, 2/6 murmur, rubs, gallops RESPIRATORY:  Clear to auscultation without rales, wheezing or rhonchi  ABDOMEN: Soft, non-tender, non-distended MUSCULOSKELETAL:  No edema; No deformity  SKIN: Warm and dry  NEUROLOGIC:  Alert and oriented x 3 PSYCHIATRIC:  Normal affect   ASSESSMENT:    1. Coronary artery disease of native artery of native heart with stable angina pectoris ([place])   2. Precordial chest pain   3. Chronic heart failure with preserved ejection fraction ([place])   4. Essential hypertension   5. Nonrheumatic aortic valve stenosis   6. ESRD on dialysis ([place])   7. NSTEMI (non-ST elevated myocardial infarction) ([place])   8. Hyperlipidemia, unspecified hyperlipidemia type    PLAN:    In order of problems listed above:  #Coronary Artery Disease: Cath 06/2022 with 99% D1, 60% D2, 95% D3, 60% OM1. TTE with LVEF 50-55%. Was  recommended for  continued medical management. Currently, doing well without anginal symptoms. -Continue ASA 81mg  daily -Continue lipitor 40mg  daily -Continue coreg 12.5mg  BID  #Chronic Diastolic HF: TTE 05/6044 with LVFE 50-55%, G2DD, RV okay, mild AS, mild AR. On HD for volume management. -Continue HD for volume management -No spiro/SGLT2i due to ESRD  #HTN: BP elevated today but drops low with HD. Will try to optimize timing of medications to help minimize lows and highs. He will bring in BP log for [place] to review. -Continue amlodipine 10 mg daily, carvedilol 12.5 mg BID, losartan 25 mg daily  -Discussed holding BP meds prior to HD sessions and can take afterwards if SBP okay -Will bring BP log to review in 3 months so we can optimize dosing/timing of meds as he does sometimes need midodrine with HD  #ESRD on HD: -Continue scheduled HD -Continue midodrine 10mg  as needed with HD sessions  #Mild AS: #Mild AR: -Continue serial montioring with next TTE 06/2024 unless clinical change  #HLD: -Continue lipitor 40mg  daily -Repeat cholesterol 8 weeks to ensure LDL at goal         Follow Up: 3 months (with bp log and lipid panel)   Medication Adjustments/Labs and Tests Ordered: Current medicines are reviewed at length with the patient today.  Concerns regarding medicines are outlined above.  Orders Placed This Encounter  Procedures   Lipid Profile   EKG 12-Lead   Meds ordered this encounter  Medications   midodrine (PROAMATINE) 10 MG tablet    Sig: Take 1 tablet (10 mg total) by mouth every Monday, Wednesday, and Friday. Take 10 mg (1 tablet) by mouth before dialysis and 10 mg (1 tablet) at dialysis as directed.    Dispense:  45 tablet    Refill:  1   carvedilol (COREG) 12.5 MG tablet    Sig: Take 1 tablet (12.5 mg total) by mouth 2 (two) times daily with a meal.    Dispense:  180 tablet    Refill:  2    Please prepare in blister pack   amLODipine (NORVASC) 10 MG tablet     Sig: Take 1 tablet (10 mg total) by mouth daily.    Dispense:  90 tablet    Refill:  2    Patient Instructions  Medication Instructions:   Your physician recommends that you continue on your current medications as directed. Please refer to the Current Medication list given to you today.  *If you need a refill on your cardiac medications before your next appointment, please call your pharmacy*   Lab Work:  IN 3 MONTHS HERE IN THE OFFICE--SAME DAY AS YOU SEE DR. Jacolyn Reedy PHYSICIAN ASSISTANT--CHECK LIPIDS AT THAT TIME--PLEASE COME FASTING TO THIS LAB APPOINTMENT  If you have labs (blood work) drawn today and your tests are completely normal, you will receive your results only by: [place] (if you have MyChart) OR A paper copy in the mail If you have any lab test that is abnormal or we need to change your treatment, we will call you to review the results.   Follow-Up:  3 MONTHS WITH AN EXTENDER IN THE OFFICE--DO LIPIDS SAME DAY--PLEASE SCHEDULE WITH AN EXTENDER PER DR. Johney Frame   Other Instructions ---PLEASE BRING YOUR BLOOD PRESSURE RECORDINGS WITH YOU TO YOUR 3 MONTHS FOLLOW-UP APPOINTMENT, AS DIRECTED BY DR. Johney Frame TODAY  ---PLEASE SEND AN UPDATED MEDICATION LIST THROUGH MYCHART OR DROP THIS OFF TO THE FRONT DESK     I,Coren O'Brien,acting as a scribe for [employer],  MD.,have documented all relevant documentation on the behalf of Freada Bergeron, MD,as directed by  Freada Bergeron, MD while in the presence of Freada Bergeron, MD.  I, Freada Bergeron, MD, have reviewed all documentation for this visit. The documentation on 09/28/22 for the exam, diagnosis, procedures, and orders are all accurate and complete.   Signed, Freada Bergeron, MD  09/28/2022 11:00 AM    [email or web address]

## 2022-09-30 DIAGNOSIS — D631 Anemia in chronic kidney disease: Secondary | ICD-10-CM | POA: Diagnosis not present

## 2022-09-30 DIAGNOSIS — Z992 Dependence on renal dialysis: Secondary | ICD-10-CM | POA: Diagnosis not present

## 2022-09-30 DIAGNOSIS — J849 Interstitial pulmonary disease, unspecified: Secondary | ICD-10-CM | POA: Diagnosis not present

## 2022-09-30 DIAGNOSIS — N186 End stage renal disease: Secondary | ICD-10-CM | POA: Diagnosis not present

## 2022-09-30 DIAGNOSIS — I5022 Chronic systolic (congestive) heart failure: Secondary | ICD-10-CM | POA: Diagnosis not present

## 2022-10-02 ENCOUNTER — Telehealth: Payer: Self-pay | Admitting: *Deleted

## 2022-10-02 ENCOUNTER — Ambulatory Visit: Payer: Medicare Other | Admitting: Podiatry

## 2022-10-02 NOTE — Telephone Encounter (Signed)
I called the patient's daughter and rescheduled him to Monday, 10/04/2022 at 10:45 am.

## 2022-10-04 ENCOUNTER — Ambulatory Visit: Payer: Medicare Other | Admitting: Podiatry

## 2022-10-04 ENCOUNTER — Encounter: Payer: Self-pay | Admitting: Podiatry

## 2022-10-04 VITALS — BP 119/58

## 2022-10-04 DIAGNOSIS — I739 Peripheral vascular disease, unspecified: Secondary | ICD-10-CM

## 2022-10-04 DIAGNOSIS — M79675 Pain in left toe(s): Secondary | ICD-10-CM

## 2022-10-04 DIAGNOSIS — B351 Tinea unguium: Secondary | ICD-10-CM | POA: Diagnosis not present

## 2022-10-04 DIAGNOSIS — N186 End stage renal disease: Secondary | ICD-10-CM | POA: Diagnosis not present

## 2022-10-04 DIAGNOSIS — Z992 Dependence on renal dialysis: Secondary | ICD-10-CM | POA: Diagnosis not present

## 2022-10-04 DIAGNOSIS — M79674 Pain in right toe(s): Secondary | ICD-10-CM

## 2022-10-04 NOTE — Progress Notes (Signed)
  Subjective:  Patient ID: Travis [name], male    DOB: [birth year],  MRN: [id number]  Travis [name] presents to clinic today for at risk foot care. Patient has h/o PAD and painful elongated mycotic toenails 1-5 bilaterally which are tender when wearing enclosed shoe gear. Pain is relieved with periodic professional debridement.  Chief Complaint  Patient presents with   Nail Problem    RFC PCP-K[facility] PCP VST-10/01/2022   He is on dialysis on MWF.  New problem(s): None.   PCP is Koirala, [place], MD.  No Known Allergies  Review of Systems: Negative except as noted in the HPI.  Objective: No changes noted in today's physical examination. Vitals:   10/04/22 1117  BP: (!) 119/58   Travis [name] is a pleasant 82 y.o. male frail, in NAD. AAO x 3.   Vascular Examination: CFT <3 seconds b/l. DP pulses faintly palpable b/l. PT pulses nonpalpable b/l. Digital hair absent. Skin temperature gradient warm to warm b/l. No pain with calf compression. No ischemia or gangrene. No cyanosis or clubbing noted b/l.    Neurological Examination: Sensation grossly intact b/l with 10 gram monofilament. Vibratory sensation intact b/l.   Dermatological Examination: Pedal skin thin, shiny and atrophic b/l.  No open wounds b/l. No interdigital macerations. Toenails 1-5 b/l thick, discolored, elongated with subungual debris and pain on dorsal palpation.  No hyperkeratotic nor porokeratotic lesions present on today's visit.  Musculoskeletal Examination: Muscle strength 5/5 to b/l LE. HAV with bunion deformity noted b/l LE. Utilizes rollator for ambulation assistance.  Radiographs: None  Assessment/Plan: 1. Pain due to onychomycosis of toenails of both feet   2. PAD (peripheral artery disease) ([place])   3. End stage renal disease on dialysis ([place])     No orders of the defined types were placed in this encounter.  -Patient was evaluated and treated. All patient's and/or POA's questions/concerns  answered on today's visit. -Patient to continue soft, supportive shoe gear daily. -Mycotic toenails 1-5 bilaterally were debrided in length and girth with sterile nail nippers and dremel without incident. -Patient/POA to call should there be question/concern in the interim.   Return in about 3 months (around 01/03/2023).  Marzetta Board, DPM

## 2022-10-05 DIAGNOSIS — I5022 Chronic systolic (congestive) heart failure: Secondary | ICD-10-CM | POA: Diagnosis not present

## 2022-11-16 DIAGNOSIS — N186 End stage renal disease: Secondary | ICD-10-CM | POA: Diagnosis not present

## 2022-11-16 DIAGNOSIS — I5022 Chronic systolic (congestive) heart failure: Secondary | ICD-10-CM | POA: Diagnosis not present

## 2022-11-16 DIAGNOSIS — Z23 Encounter for immunization: Secondary | ICD-10-CM | POA: Diagnosis not present

## 2022-11-16 DIAGNOSIS — Z992 Dependence on renal dialysis: Secondary | ICD-10-CM | POA: Diagnosis not present

## 2022-11-16 DIAGNOSIS — E7800 Pure hypercholesterolemia, unspecified: Secondary | ICD-10-CM | POA: Diagnosis not present

## 2022-11-16 DIAGNOSIS — Z0000 Encounter for general adult medical examination without abnormal findings: Secondary | ICD-10-CM | POA: Diagnosis not present

## 2022-11-16 DIAGNOSIS — G629 Polyneuropathy, unspecified: Secondary | ICD-10-CM | POA: Diagnosis not present

## 2022-11-16 DIAGNOSIS — I272 Pulmonary hypertension, unspecified: Secondary | ICD-10-CM | POA: Diagnosis not present

## 2022-12-05 ENCOUNTER — Inpatient Hospital Stay (HOSPITAL_COMMUNITY): Payer: No Typology Code available for payment source | Admitting: Anesthesiology

## 2022-12-05 ENCOUNTER — Other Ambulatory Visit: Payer: Self-pay

## 2022-12-05 ENCOUNTER — Emergency Department (HOSPITAL_COMMUNITY): Payer: No Typology Code available for payment source

## 2022-12-05 ENCOUNTER — Encounter (HOSPITAL_COMMUNITY): Payer: Self-pay | Admitting: Family Medicine

## 2022-12-05 ENCOUNTER — Inpatient Hospital Stay (HOSPITAL_COMMUNITY)
Admission: EM | Admit: 2022-12-05 | Discharge: 2022-12-08 | DRG: 521 | Disposition: A | Discharge status: Home Health | Payer: No Typology Code available for payment source | Attending: Family Medicine | Admitting: Family Medicine

## 2022-12-05 DIAGNOSIS — Z992 Dependence on renal dialysis: Secondary | ICD-10-CM | POA: Diagnosis not present

## 2022-12-05 DIAGNOSIS — I2510 Atherosclerotic heart disease of native coronary artery without angina pectoris: Secondary | ICD-10-CM | POA: Diagnosis present

## 2022-12-05 DIAGNOSIS — Z79899 Other long term (current) drug therapy: Secondary | ICD-10-CM | POA: Diagnosis not present

## 2022-12-05 DIAGNOSIS — I455 Other specified heart block: Secondary | ICD-10-CM | POA: Diagnosis present

## 2022-12-05 DIAGNOSIS — D631 Anemia in chronic kidney disease: Secondary | ICD-10-CM | POA: Diagnosis present

## 2022-12-05 DIAGNOSIS — M3500 Sicca syndrome, unspecified: Secondary | ICD-10-CM | POA: Diagnosis present

## 2022-12-05 DIAGNOSIS — I5042 Chronic combined systolic (congestive) and diastolic (congestive) heart failure: Secondary | ICD-10-CM | POA: Diagnosis present

## 2022-12-05 DIAGNOSIS — J9811 Atelectasis: Secondary | ICD-10-CM | POA: Diagnosis present

## 2022-12-05 DIAGNOSIS — Z95828 Presence of other vascular implants and grafts: Secondary | ICD-10-CM | POA: Diagnosis not present

## 2022-12-05 DIAGNOSIS — I2583 Coronary atherosclerosis due to lipid rich plaque: Secondary | ICD-10-CM

## 2022-12-05 DIAGNOSIS — I10 Essential (primary) hypertension: Secondary | ICD-10-CM | POA: Diagnosis present

## 2022-12-05 DIAGNOSIS — S72011A Unspecified intracapsular fracture of right femur, initial encounter for closed fracture: Principal | ICD-10-CM | POA: Diagnosis present

## 2022-12-05 DIAGNOSIS — Z86718 Personal history of other venous thrombosis and embolism: Secondary | ICD-10-CM

## 2022-12-05 DIAGNOSIS — R112 Nausea with vomiting, unspecified: Secondary | ICD-10-CM | POA: Diagnosis not present

## 2022-12-05 DIAGNOSIS — Z8249 Family history of ischemic heart disease and other diseases of the circulatory system: Secondary | ICD-10-CM

## 2022-12-05 DIAGNOSIS — J8410 Pulmonary fibrosis, unspecified: Secondary | ICD-10-CM | POA: Diagnosis present

## 2022-12-05 DIAGNOSIS — N186 End stage renal disease: Secondary | ICD-10-CM | POA: Diagnosis not present

## 2022-12-05 DIAGNOSIS — K219 Gastro-esophageal reflux disease without esophagitis: Secondary | ICD-10-CM | POA: Diagnosis present

## 2022-12-05 DIAGNOSIS — S72001A Fracture of unspecified part of neck of right femur, initial encounter for closed fracture: Principal | ICD-10-CM | POA: Diagnosis present

## 2022-12-05 DIAGNOSIS — W010XXA Fall on same level from slipping, tripping and stumbling without subsequent striking against object, initial encounter: Secondary | ICD-10-CM | POA: Diagnosis present

## 2022-12-05 DIAGNOSIS — I132 Hypertensive heart and chronic kidney disease with heart failure and with stage 5 chronic kidney disease, or end stage renal disease: Secondary | ICD-10-CM | POA: Diagnosis present

## 2022-12-05 DIAGNOSIS — M1990 Unspecified osteoarthritis, unspecified site: Secondary | ICD-10-CM | POA: Diagnosis present

## 2022-12-05 DIAGNOSIS — I509 Heart failure, unspecified: Secondary | ICD-10-CM | POA: Diagnosis not present

## 2022-12-05 DIAGNOSIS — I9589 Other hypotension: Secondary | ICD-10-CM | POA: Diagnosis present

## 2022-12-05 DIAGNOSIS — E871 Hypo-osmolality and hyponatremia: Secondary | ICD-10-CM | POA: Diagnosis not present

## 2022-12-05 DIAGNOSIS — Z7982 Long term (current) use of aspirin: Secondary | ICD-10-CM | POA: Diagnosis not present

## 2022-12-05 DIAGNOSIS — I110 Hypertensive heart disease with heart failure: Secondary | ICD-10-CM | POA: Diagnosis not present

## 2022-12-05 DIAGNOSIS — I252 Old myocardial infarction: Secondary | ICD-10-CM | POA: Diagnosis not present

## 2022-12-05 DIAGNOSIS — E875 Hyperkalemia: Secondary | ICD-10-CM | POA: Diagnosis not present

## 2022-12-05 LAB — CBC WITH DIFFERENTIAL/PLATELET
Abs Immature Granulocytes: 0.03 10*3/uL (ref 0.00–0.07)
Basophils Absolute: 0.1 10*3/uL (ref 0.0–0.1)
Basophils Relative: 1 %
Eosinophils Absolute: 0.2 10*3/uL (ref 0.0–0.5)
Eosinophils Relative: 2 %
HCT: 38.4 % — ABNORMAL LOW (ref 39.0–52.0)
Hemoglobin: 12.1 g/dL — ABNORMAL LOW (ref 13.0–17.0)
Immature Granulocytes: 0 %
Lymphocytes Relative: 12 %
Lymphs Abs: 1.1 10*3/uL (ref 0.7–4.0)
MCH: 30 pg (ref 26.0–34.0)
MCHC: 31.5 g/dL (ref 30.0–36.0)
MCV: 95.3 fL (ref 80.0–100.0)
Monocytes Absolute: 1.4 10*3/uL — ABNORMAL HIGH (ref 0.1–1.0)
Monocytes Relative: 14 %
Neutro Abs: 6.9 10*3/uL (ref 1.7–7.7)
Neutrophils Relative %: 71 %
Platelets: 221 10*3/uL (ref 150–400)
RBC: 4.03 MIL/uL — ABNORMAL LOW (ref 4.22–5.81)
RDW: 17.9 % — ABNORMAL HIGH (ref 11.5–15.5)
WBC: 9.6 10*3/uL (ref 4.0–10.5)
nRBC: 0 % (ref 0.0–0.2)

## 2022-12-05 LAB — BASIC METABOLIC PANEL
Anion gap: 15 (ref 5–15)
BUN: 49 mg/dL — ABNORMAL HIGH (ref 8–23)
CO2: 26 mmol/L (ref 22–32)
Calcium: 8.8 mg/dL — ABNORMAL LOW (ref 8.9–10.3)
Chloride: 93 mmol/L — ABNORMAL LOW (ref 98–111)
Creatinine, Ser: 11.07 mg/dL — ABNORMAL HIGH (ref 0.61–1.24)
GFR, Estimated: 4 mL/min — ABNORMAL LOW (ref >60)
Glucose, Bld: 71 mg/dL (ref 70–99)
Potassium: 5.9 mmol/L — ABNORMAL HIGH (ref 3.5–5.1)
Sodium: 134 mmol/L — ABNORMAL LOW (ref 135–145)

## 2022-12-05 LAB — TYPE AND SCREEN
ABO/RH(D): A POS
Antibody Screen: NEGATIVE

## 2022-12-05 LAB — POTASSIUM: Potassium: 6.1 mmol/L — ABNORMAL HIGH (ref 3.5–5.1)

## 2022-12-05 LAB — BRAIN NATRIURETIC PEPTIDE: B Natriuretic Peptide: 365.7 pg/mL — ABNORMAL HIGH (ref 0.0–100.0)

## 2022-12-05 MED ORDER — ONDANSETRON HCL 4 MG/2ML IJ SOLN
4.0000 mg | Freq: Once | INTRAMUSCULAR | Status: AC
  Administered 2022-12-05: 4 mg via INTRAVENOUS
  Filled 2022-12-05: qty 2

## 2022-12-05 MED ORDER — CINACALCET HCL 30 MG PO TABS: 150.0000 mg | ORAL_TABLET | ORAL | Status: DC

## 2022-12-05 MED ORDER — CALCITRIOL 0.5 MCG PO CAPS
2.5000 ug | ORAL_CAPSULE | ORAL | Status: DC
  Administered 2022-12-08: 2.5 ug via ORAL
  Filled 2022-12-05: qty 5

## 2022-12-05 MED ORDER — SENNA 8.6 MG PO TABS: 1.0000 | ORAL_TABLET | Freq: Every day | ORAL | Status: DC | PRN

## 2022-12-05 MED ORDER — CLONIDINE HCL (ANALGESIA) 100 MCG/ML EP SOLN
EPIDURAL | Status: DC | PRN
  Administered 2022-12-05: 70 ug

## 2022-12-05 MED ORDER — MORPHINE SULFATE (PF) 4 MG/ML IV SOLN
4.0000 mg | Freq: Once | INTRAVENOUS | Status: AC
  Administered 2022-12-05: 4 mg via INTRAVENOUS
  Filled 2022-12-05: qty 1

## 2022-12-05 MED ORDER — DEXAMETHASONE SODIUM PHOSPHATE 4 MG/ML IJ SOLN
INTRAMUSCULAR | Status: DC | PRN
  Administered 2022-12-05: 5 mg via PERINEURAL

## 2022-12-05 MED ORDER — OXYCODONE HCL 5 MG PO TABS
5.0000 mg | ORAL_TABLET | ORAL | Status: DC | PRN
  Administered 2022-12-05 – 2022-12-07 (×2): 10 mg via ORAL
  Filled 2022-12-05 (×2): qty 2

## 2022-12-05 MED ORDER — HYDROMORPHONE HCL 1 MG/ML IJ SOLN
0.5000 mg | INTRAMUSCULAR | Status: DC | PRN
  Administered 2022-12-05: 0.5 mg via INTRAVENOUS
  Filled 2022-12-05: qty 1

## 2022-12-05 MED ORDER — ATORVASTATIN CALCIUM 40 MG PO TABS
40.0000 mg | ORAL_TABLET | Freq: Every day | ORAL | Status: DC
  Administered 2022-12-06 – 2022-12-08 (×3): 40 mg via ORAL
  Filled 2022-12-05 (×3): qty 1

## 2022-12-05 MED ORDER — METHOCARBAMOL 500 MG PO TABS
500.0000 mg | ORAL_TABLET | Freq: Four times a day (QID) | ORAL | Status: DC | PRN
  Administered 2022-12-05: 500 mg via ORAL
  Filled 2022-12-05: qty 1

## 2022-12-05 MED ORDER — HYDROMORPHONE HCL 1 MG/ML IJ SOLN
1.0000 mg | Freq: Once | INTRAMUSCULAR | Status: AC
  Administered 2022-12-05: 1 mg via INTRAVENOUS
  Filled 2022-12-05: qty 1

## 2022-12-05 MED ORDER — CHLORHEXIDINE GLUCONATE CLOTH 2 % EX PADS
6.0000 | MEDICATED_PAD | Freq: Every day | CUTANEOUS | Status: DC
  Administered 2022-12-06 – 2022-12-08 (×3): 6 via TOPICAL

## 2022-12-05 MED ORDER — ROPIVACAINE HCL 5 MG/ML IJ SOLN
INTRAMUSCULAR | Status: DC | PRN
  Administered 2022-12-05: 30 mL via PERINEURAL

## 2022-12-05 MED ORDER — SODIUM ZIRCONIUM CYCLOSILICATE 10 G PO PACK
10.0000 g | PACK | Freq: Once | ORAL | Status: AC
  Administered 2022-12-05: 10 g via ORAL
  Filled 2022-12-05: qty 1

## 2022-12-05 MED ORDER — ENSURE PRE-SURGERY PO LIQD
296.0000 mL | Freq: Once | ORAL | Status: DC
  Filled 2022-12-05: qty 296

## 2022-12-05 MED ORDER — MIDODRINE HCL 5 MG PO TABS
10.0000 mg | ORAL_TABLET | ORAL | Status: DC
  Administered 2022-12-08: 10 mg via ORAL
  Filled 2022-12-05: qty 2

## 2022-12-05 MED ORDER — CARVEDILOL 12.5 MG PO TABS
12.5000 mg | ORAL_TABLET | Freq: Two times a day (BID) | ORAL | Status: DC
  Administered 2022-12-06 – 2022-12-07 (×3): 12.5 mg via ORAL
  Filled 2022-12-05 (×3): qty 1

## 2022-12-05 NOTE — Anesthesia Preprocedure Evaluation (Addendum)
Anesthesia Evaluation    Reviewed: Allergy & Precautions, Patient's Chart, lab work & pertinent test results, Unable to perform ROS - Chart review only  Airway        Dental   Pulmonary neg pulmonary ROS          Cardiovascular hypertension, Pt. on medications and Pt. on home beta blockers pulmonary hypertension+ CAD, + Past MI and +CHF    Echo 06/2022  1. Left ventricular ejection fraction, by estimation, is 45 to 50%. Left ventricular ejection fraction by 2D MOD biplane is 47.4%. The left ventricle has mildly decreased function. The left ventricle has no regional wall motion abnormalities. There is moderate asymmetric left ventricular hypertrophy of the infero-lateral segment. Left ventricular diastolic parameters are consistent with Grade II diastolic dysfunction (pseudonormalization).   2. Right ventricular systolic function is normal. The right ventricular size is normal. Tricuspid regurgitation signal is inadequate for assessing PA pressure.   3. The mitral valve is grossly normal. Mild mitral valve regurgitation. No evidence of mitral stenosis.   4. The aortic valve is tricuspid. There is moderate calcification of the aortic valve. There is moderate thickening of the aortic valve. Aortic valve regurgitation is mild. Mild aortic valve stenosis.   5. The inferior vena cava is normal in size with greater than 50% respiratory variability, suggesting right atrial pressure of 3 mmHg.    LHC 06/2022   Heavily calcified diffusely diseased vessel: 1st Diag-1 lesion is 99% stenosed. 1st Diag-2 lesion is 60% stenosed. 1st Diag-3 lesion is 95% stenosed.   The left ventricular systolic function is normal. The left ventricular ejection fraction is 50-55% by visual estimate.   LV end diastolic pressure is moderately elevated.   Hemodynamic findings consistent with mild pulmonary hypertension.   POSTOP DIAGNOSES Moderate-severe 2-vessel  single-vessel disease: Proximal D1-99%, mid 60%, distal 95% (heavily calcified/diffusely diseased; not favorable for PCI would require atherectomy and a relatively small caliber vessel) & 60% ostial sidebranch of OM1 followed by a 60% mid vessel then distal occlusion (with right to left collaterals). Otherwise mild diffuse disease/calcified vessels Mostly preserved LVEF, cannot exclude anterolateral hypokinesis.   LVEDP moderately elevated at 19 mmHg (LVEDP 166/5 mmHg) with a PCWP of ~22 mmHg. RHC numbers:  Mean RAP 6 mmHg, RVP-EDP 51/0-5 mmHg;  PAP-mean 50/13-27 mmHg; PCWP 17-26 mmHg (~22 mmHg);  Ao sat 99%, PA sat 74%.   CO-CI: Fick -> 8.22, 4.44; thermal 5.37-2.7 (suspect that the discrepancy is related to AV fistula)     RECOMMENDATIONS Would recommend medical management for heavily diseased D1 branch.   This is heavily calcified vessel with diffuse disease, will require atherectomy and extensive stent placement in the vessel is roughly 2 to 2.25 mm distally.  Not favorable for PCI. Consider slightly increased volume removal and HD based on elevated filling pressures. Return to nursing for ongoing care       Neuro/Psych  PSYCHIATRIC DISORDERS      negative neurological ROS     GI/Hepatic Neg liver ROS,GERD  ,,  Endo/Other  negative endocrine ROS    Renal/GU ESRF and DialysisRenal disease     Musculoskeletal  (+) Arthritis ,    Abdominal   Peds  Hematology  (+) Blood dyscrasia, anemia   Anesthesia Other Findings   Reproductive/Obstetrics                             Anesthesia Physical Anesthesia Plan  ASA: 4  Anesthesia Plan:  Regional   Post-op Pain Management:    Induction:   PONV Risk Score and Plan:   Airway Management Planned:   Additional Equipment:   Intra-op Plan:   Post-operative Plan:   Informed Consent: I have reviewed the patients History and Physical, chart, labs and discussed the procedure including  the risks, benefits and alternatives for the proposed anesthesia with the patient or authorized representative who has indicated his/her understanding and acceptance.       Plan Discussed with:   Anesthesia Plan Comments:        Anesthesia Quick Evaluation

## 2022-12-05 NOTE — Progress Notes (Signed)
Orthopedic Tech Progress Note Patient Details:  Travis [name] 07/29/1941 [id number]  Patient ID: [name], male   DOB: Jul 16, 1941, 82 y.o.   MRN: [id number] Patient doesn't meet criteria for ohf. Must be under 70 to get an ohf. Karolee Stamps 12/05/2022, 8:44 PM

## 2022-12-05 NOTE — Consult Note (Signed)
Renal Service Consult Note [facility]  Sotirios Radden 12/05/2022 Sol Blazing, MD Requesting Physician: Dr. Myna Hidalgo  Reason for Consult: ESRD pt w/ fall and R hip fracture HPI: The patient is a 82 y.o. year-old w/ PMH as below who presented to ED w/ R hip pain after a mechanical fall. In ED pt afebrile, not hypoxic and normal HR and BP. EKG w/o ischemia. CXR mild R atelectasis. K+ 5.9 given lokelma. Orthopedics consulting. We are asked to see for dialysis.    Pt seen in ED room. Pt is a bit lethargic but denies any CP or abd pain, no SOB.   ROS - denies CP, no joint pain, no HA, no blurry vision, no rash, no diarrhea, no nausea/ vomiting, no dysuria, no difficulty voiding   Past Medical History  Past Medical History:  Diagnosis Date   Anemia    Arthritis    Chronic combined systolic and diastolic CHF (congestive heart failure) ([place])    a. Etiology of low EF not defined.   DVT (deep venous thrombosis) ([place])    a. 01/2015: RLE DVT. VQ scan intermediate probability. Underwent renal bx complicated by perinephric hematoma; anticoagulation stopped and IVC filter placed.   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 Received plasma exchange and steroids but didn't respond and ended up starting hemodialysis June 2016.    Essential hypertension    GERD (gastroesophageal reflux disease)    History of nuclear stress test    Myoview 9/19 [facility]): low risk    HTN (hypertension)    Hypoalbuminemia    Hypoglycemia    NSTEMI (non-ST elevated myocardial infarction) ([place]) 04/18/2015   OA (osteoarthritis) of knee    Perinephric hematoma 01/2015   Proctitis 07/2015   Protein calorie malnutrition ([place])    Pulmonary fibrosis ([place])    Sjogren's disease ([place]) 01/28/2015   Past Surgical History  Past Surgical History:  Procedure Laterality Date   AV FISTULA PLACEMENT Right 02/17/2015    Procedure: INSERTION OF RIGHT ARM  ARTERIOVENOUS (AV) GORE-TEX GRAFT ;  Surgeon: Elam Dutch, MD;  Location: [place];  Service: Vascular;  Laterality: Right;   FLEXIBLE SIGMOIDOSCOPY N/A 08/04/2015   Procedure: FLEXIBLE SIGMOIDOSCOPY;  Surgeon: Wilford Corner, MD;  Location: [place];  Service: Endoscopy;  Laterality: N/A;   HEMORROIDECTOMY  1999   INSERTION OF DIALYSIS CATHETER N/A 02/17/2015   Procedure: INSERTION OF DIALYSIS CATHETER RIGHT INTERNAL JUGULAR VEIN;  Surgeon: Elam Dutch, MD;  Location: [place];  Service: Vascular;  Laterality: N/A;   MULTIPLE TOOTH EXTRACTIONS     PERIPHERAL VASCULAR BALLOON ANGIOPLASTY Right 01/23/2019   Procedure: PERIPHERAL VASCULAR BALLOON ANGIOPLASTY;  Surgeon: Elam Dutch, MD;  Location: [place] CV [facility];  Service: Cardiovascular;  Laterality: Right;   REVISION OF ARTERIOVENOUS GORETEX GRAFT Right 03/29/2017   Procedure: REVISION OF ARTERIOVENOUS GORETEX GRAFT;  Surgeon: Conrad [place], MD;  Location: [place];  Service: Vascular;  Laterality: Right;   RIGHT/LEFT HEART CATH AND CORONARY ANGIOGRAPHY N/A 07/06/2022   Procedure: RIGHT/LEFT HEART CATH AND CORONARY ANGIOGRAPHY;  Surgeon: Leonie Man, MD;  Location: [place] [facility];  Service: Cardiovascular;  Laterality: N/A;   Surgical procedure to remove a mole as a child Right Eye area   At around 80 years old   Family History  Family History  Problem Relation Age of Onset   Hypertension Mother  Healthy Father    Hypertension Sister    Hypertension Brother    Social History  reports that he has never smoked. He has never used smokeless tobacco. He reports that he does not drink alcohol and does not use drugs. Allergies No Known Allergies Home medications Prior to Admission medications   Medication Sig Start Date End Date Taking? Authorizing Provider  Accu-Chek Softclix Lancets lancets Use to test blood glucose four times daily as directed. 05/26/21   Thurnell Lose, MD   acetaminophen (TYLENOL) 500 MG tablet Take 500 mg by mouth every 6 (six) hours as needed for moderate pain.    [provider]  albuterol (VENTOLIN HFA) 108 (90 Base) MCG/ACT inhaler Inhale 2 puffs into the lungs every 6 (six) hours as needed for wheezing or shortness of breath. 08/19/22   [provider]  amLODipine (NORVASC) 10 MG tablet Take 1 tablet (10 mg total) by mouth daily. 09/28/22   Freada Bergeron, MD  aspirin EC 81 MG EC tablet Take 1 tablet (81 mg total) by mouth daily. 04/23/15   Reyne Dumas, MD  atorvastatin (LIPITOR) 40 MG tablet Take 1 tablet (40 mg total) by mouth daily at 6 PM. 09/21/22   Mariel Aloe, MD  atropine 1 % ophthalmic solution 1 application into the lower eyelid of affected eye Ophthalmic Once a day    [provider]  Blood Glucose Monitoring Suppl (BLOOD GLUCOSE MONITOR SYSTEM) w/Device KIT Use up to four times daily as directed. 05/26/21   Thurnell Lose, MD  Brinzolamide-Brimonidine 1-0.2 % SUSP Place 1 drop into both eyes in the morning, at noon, and at bedtime. 11/03/21   [provider]  calcitRIOL (ROCALTROL) 0.25 MCG capsule Take 13 capsules (3.25 mcg total) by mouth every Monday, Wednesday, and Friday with hemodialysis. 12/25/21   Cherene Altes, MD  carvedilol (COREG) 12.5 MG tablet Take 1 tablet (12.5 mg total) by mouth 2 (two) times daily with a meal. 09/28/22   Freada Bergeron, MD  cinacalcet (SENSIPAR) 30 MG tablet Take 3 tablets (90 mg total) by mouth every Monday, Wednesday, and Friday with hemodialysis. 12/25/21   Cherene Altes, MD  ferric gluconate 62.5 mg in sodium chloride 0.9 % 100 mL Inject 62.5 mg into the vein every Wednesday with hemodialysis. 12/30/21   Cherene Altes, MD  gabapentin (NEURONTIN) 300 MG capsule Take 300 mg by mouth daily.    [provider]  glucose blood test strip Use to test blood glucose four times daily as directed 05/26/21   Thurnell Lose, MD   latanoprost (XALATAN) 0.005 % ophthalmic solution Place 1 drop into both eyes at bedtime. 05/05/21   [provider]  methocarbamol (ROBAXIN) 500 MG tablet 1 tablet Three times a day as needed    [provider]  midodrine (PROAMATINE) 10 MG tablet Take 1 tablet (10 mg total) by mouth every Monday, Wednesday, and Friday. Take 10 mg (1 tablet) by mouth before dialysis and 10 mg (1 tablet) at dialysis as directed. 09/29/22   Freada Bergeron, MD  nitroGLYCERIN (NITROSTAT) 0.4 MG SL tablet Place 1 tablet (0.4 mg total) under the tongue every 5 (five) minutes as needed for chest pain (for total of 3 doses at the most). 07/08/22   Domenic Polite, MD  omeprazole (PRILOSEC) 40 MG capsule Take 40 mg by mouth daily. 07/31/15   [provider]  prednisoLONE acetate (PRED FORTE) 1 % ophthalmic suspension 1 drop into affected  eye Ophthalmic Twice a day    [provider]  sevelamer carbonate (RENVELA) 800 MG tablet Take 2 tablets (1,600 mg total) by mouth 3 (three) times daily with meals. 05/17/15   Cherene Altes, MD  timolol (TIMOPTIC-XR) 0.5 % ophthalmic gel-forming Place 1 drop into both eyes daily. 11/03/21   [provider]     Vitals:   12/05/22 2042 12/05/22 2123 12/05/22 2151 12/05/22 2200  BP: (!) 182/77 (!) 177/73  (!) 161/66  Pulse: 64 64 62 62  Resp:  (!) 24 15 14   Temp: 97.9 F (36.6 C) 98 F (36.7 C)  97.9 F (36.6 C)  TempSrc: Oral     SpO2:  98% 95% 95%   Exam Gen alert, elderly AAM no distress No rash, cyanosis or gangrene Sclera anicteric, throat clear  No jvd or bruits Chest clear bilat to bases, no rales/ wheezing RRR no MRG Abd soft ntnd no mass or ascites +bs GU defer MS no joint effusions or deformity Ext no LE or UE edema, no wounds or ulcers Neuro is alert, Ox 3 , nf    RFA AVG+bruit   Home meds include norvasc 10, asa, gabapentin 300 qd, prilosec, renvela 2 ac tid, lipitor, coreg 12.5 bid, midodrine 10mg  pre hd  mwf, prns/ vits / supps    OP HD: MWF [facility]  3.5h  400/1.5   70.5kg  2/2 bath  AVG   Heparin none - last HD 3/22, post wt 70.3kg - rocaltrol 2.5 mcg po tiw - sensipar 150mg  po tiw - no esa, last Hb 11.5   Assessment/ Plan: Fall/ R hip fracture - per pmd / ortho Hyperkalemia - mild at 5.9, sp lokelma 10gm.  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 Plan HD tomorrow, schedule around hip surgery.  HTN/ volume - takes norvasc at home. No vol excess on exam. Get bed weight in room.   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 Cont renvela 2 ac tid as binder.  Chronic hypotension - give midodrine 10mg  pre hd mwf    Kelly Splinter  MD [facility] 12/05/2022, 10:17 PM  Recent Labs  Lab 12/05/22 1417  HGB 12.1*  CALCIUM 8.8*  CREATININE 11.07*  K 5.9*   Inpatient medications:  [START ON 12/06/2022] atorvastatin  40 mg Oral q1800   [START ON 12/06/2022] carvedilol  12.5 mg Oral BID WC   [START ON 12/06/2022] midodrine  10 mg Oral Q M,W,F    HYDROmorphone (DILAUDID) injection, methocarbamol, oxyCODONE, senna

## 2022-12-05 NOTE — Progress Notes (Signed)
Dr. Lissa Hoard at bedside to discuss right femoral nerve block procedure with patient. Patient verbalized understanding but was drowsy and unable to sign consent. Dr. Lissa Hoard called patient's spouse Izaha Orosco to obtain consent which I witnessed and signed for. All safety checks were performed and patient was marked for correct site. Nerve block procedure was started at 2143 and was completed at 2143. Patient tolerated procedure well and vital signs remained stable throughout.

## 2022-12-05 NOTE — ED Notes (Signed)
ED TO INPATIENT HANDOFF REPORT  ED Nurse Name and Phone #: Lexine Baton [phone]  S Name/Age/Gender Travis [name] 82 y.o. male Room/Bed: 004C/004C  Code Status   Code Status: Full Code  Home/SNF/Other Home Patient oriented to: self, place, time, and situation Is this baseline? Yes   Triage Complete: Triage complete  Chief Complaint Closed right hip fracture, initial encounter [facility]) [S72.001A]  Triage Note Patient had a mechanical fall prior to arrival, fell and landed on R hip. Currently painful to touch and hard to move. Not on blood thinners.    Allergies No Known Allergies  Level of Care/Admitting Diagnosis ED Disposition     ED Disposition  Admit   Condition  --   Comment  Hospital Area: [place] [[id number]]  Level of Care: Telemetry Surgical [[id number]]  May admit patient to [facility] or [facility] if equivalent level of care is available:: Yes  Covid Evaluation: Asymptomatic - no recent exposure (last 10 days) testing not required  Diagnosis: Closed right hip fracture, initial encounter [facility] [id number]  Admitting Physician: Vianne Bulls [id number]  Attending Physician: Vianne Bulls [id number]  Certification:: I certify this patient will need inpatient services for at least 2 midnights  Estimated Length of Stay: 3          B Medical/Surgery History Past Medical History:  Diagnosis Date   Anemia    Arthritis    Chronic combined systolic and diastolic CHF (congestive heart failure) ([place])    a. Etiology of low EF not defined.   DVT (deep venous thrombosis) ([place])    a. 01/2015: RLE DVT. VQ scan intermediate probability. Underwent renal bx complicated by perinephric hematoma; anticoagulation stopped and IVC filter placed.   ESRD on hemodialysis ([place]) 02/2015   a. 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 Received plasma exchange and steroids but didn't respond and ended up starting hemodialysis June 2016.     Essential hypertension    GERD (gastroesophageal reflux disease)    History of nuclear stress test    Myoview 9/19 [facility]): low risk    HTN (hypertension)    Hypoalbuminemia    Hypoglycemia    NSTEMI (non-ST elevated myocardial infarction) ([place]) 04/18/2015   OA (osteoarthritis) of knee    Perinephric hematoma 01/2015   Proctitis 07/2015   Protein calorie malnutrition ([place])    Pulmonary fibrosis ([place])    Sjogren's disease ([place]) 01/28/2015   Past Surgical History:  Procedure Laterality Date   AV FISTULA PLACEMENT Right 02/17/2015   Procedure: INSERTION OF RIGHT ARM  ARTERIOVENOUS (AV) GORE-TEX GRAFT ;  Surgeon: Elam Dutch, MD;  Location: [place];  Service: Vascular;  Laterality: Right;   FLEXIBLE SIGMOIDOSCOPY N/A 08/04/2015   Procedure: FLEXIBLE SIGMOIDOSCOPY;  Surgeon: Wilford Corner, MD;  Location: [place];  Service: Endoscopy;  Laterality: N/A;   HEMORROIDECTOMY  1999   INSERTION OF DIALYSIS CATHETER N/A 02/17/2015   Procedure: INSERTION OF DIALYSIS CATHETER RIGHT INTERNAL JUGULAR VEIN;  Surgeon: Elam Dutch, MD;  Location: [place];  Service: Vascular;  Laterality: N/A;   MULTIPLE TOOTH EXTRACTIONS     PERIPHERAL VASCULAR BALLOON ANGIOPLASTY Right 01/23/2019   Procedure: PERIPHERAL VASCULAR BALLOON ANGIOPLASTY;  Surgeon: Elam Dutch, MD;  Location: [place] CV [facility];  Service: Cardiovascular;  Laterality: Right;   REVISION OF ARTERIOVENOUS GORETEX GRAFT Right 03/29/2017   Procedure: REVISION OF ARTERIOVENOUS GORETEX GRAFT;  Surgeon: Conrad , MD;  Location: [place];  Service: Vascular;  Laterality: Right;   RIGHT/LEFT HEART CATH AND CORONARY ANGIOGRAPHY N/A 07/06/2022   Procedure: RIGHT/LEFT HEART CATH AND CORONARY ANGIOGRAPHY;  Surgeon: Leonie Man, MD;  Location: [place] CV LAB;  Service: Cardiovascular;  Laterality: N/A;   Surgical procedure to remove a mole as a child Right Eye area   At around 63 years old     A IV  Location/Drains/Wounds Patient Lines/Drains/Airways Status     Active Line/Drains/Airways     Name Placement date Placement time Site Days   Peripheral IV 12/05/22 20 G Anterior;Distal;Left;Upper Arm 12/05/22  1530  Arm  less than 1   Fistula / Graft Right Forearm Arteriovenous vein graft --  --  Forearm  --            Intake/Output Last 24 hours No intake or output data in the 24 hours ending 12/05/22 2034  Labs/Imaging Results for orders placed or performed during the hospital encounter of 12/05/22 (from the past 48 hour(s))  Basic metabolic panel     Status: Abnormal   Collection Time: 12/05/22  2:17 PM  Result Value Ref Range   Sodium 134 (L) 135 - 145 mmol/L   Potassium 5.9 (H) 3.5 - 5.1 mmol/L   Chloride 93 (L) 98 - 111 mmol/L   CO2 26 22 - 32 mmol/L   Glucose, Bld 71 70 - 99 mg/dL    Comment: Glucose reference range applies only to samples taken after fasting for at least 8 hours.   BUN 49 (H) 8 - 23 mg/dL   Creatinine, Ser 11.07 (H) 0.61 - 1.24 mg/dL   Calcium 8.8 (L) 8.9 - 10.3 mg/dL   GFR, Estimated 4 (L) >60 mL/min    Comment: (NOTE) Calculated using the CKD-EPI Creatinine Equation (2021)    Anion gap 15 5 - 15    Comment: Performed at [facility] [address], [place], [place] [address]  CBC with Differential/Platelet     Status: Abnormal   Collection Time: 12/05/22  2:17 PM  Result Value Ref Range   WBC 9.6 4.0 - 10.5 K/uL   RBC 4.03 (L) 4.22 - 5.81 MIL/uL   Hemoglobin 12.1 (L) 13.0 - 17.0 g/dL   HCT 38.4 (L) 39.0 - 52.0 %   MCV 95.3 80.0 - 100.0 fL   MCH 30.0 26.0 - 34.0 pg   MCHC 31.5 30.0 - 36.0 g/dL   RDW 17.9 (H) 11.5 - 15.5 %   Platelets 221 150 - 400 K/uL   nRBC 0.0 0.0 - 0.2 %   Neutrophils Relative % 71 %   Neutro Abs 6.9 1.7 - 7.7 K/uL   Lymphocytes Relative 12 %   Lymphs Abs 1.1 0.7 - 4.0 K/uL   Monocytes Relative 14 %   Monocytes Absolute 1.4 (H) 0.1 - 1.0 K/uL   Eosinophils Relative 2 %   Eosinophils Absolute 0.2 0.0 - 0.5  K/uL   Basophils Relative 1 %   Basophils Absolute 0.1 0.0 - 0.1 K/uL   Immature Granulocytes 0 %   Abs Immature Granulocytes 0.03 0.00 - 0.07 K/uL    Comment: Performed at [facility], [place] [address] [place], [place] [address]  Type and screen     Status: None   Collection Time: 12/05/22  6:02 PM  Result Value Ref Range   ABO/RH(D) A POS    Antibody Screen NEG    Sample Expiration      12/08/2022,2359 Performed at [facility]  Lab, [address], [place], [place] [address]    DG Knee Right Port  Result Date: 12/05/2022 CLINICAL DATA:  Right hip fracture.  Fall EXAM: PORTABLE RIGHT KNEE - 1-2 VIEW COMPARISON:  None Available. FINDINGS: No evidence of fracture, dislocation, or joint effusion. 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 Advanced atherosclerotic vascular calcifications. Soft tissues are otherwise unremarkable. 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 No acute fracture or dislocation. Electronically Signed   By: Davina Poke D.O.   On: 12/05/2022 17:17   DG Chest 1 View  Result Date: 12/05/2022 CLINICAL DATA:  Right hip fracture EXAM: CHEST  1 VIEW COMPARISON:  09/19/2022 FINDINGS: Upper normal heart size. Mediastinal contours and pulmonary vascularity normal. Vascular stent proximal RIGHT subclavian vessel. Atherosclerotic calcification aorta. Minimal RIGHT basilar atelectasis. Lungs otherwise clear. No acute infiltrate, pleural effusion, or pneumothorax. Bones demineralized. IMPRESSION: Mild RIGHT basilar atelectasis. Aortic Atherosclerosis (ICD10-I70.0). Electronically Signed   By: Lavonia Dana M.D.   On: 12/05/2022 15:35   DG Hip Unilat W or Wo Pelvis 2-3 Views Right  Result Date: 12/05/2022 CLINICAL DATA:  Hip pain post fall EXAM: DG HIP (WITH OR WITHOUT PELVIS) 2-3V RIGHT COMPARISON:  08/25/2015 FINDINGS: Osseous demineralization. Hip joint spaces preserved. SI joints less well visualized.  Mildly displaced RIGHT femoral neck fracture without dislocation. No additional fracture or dislocation. Scattered atherosclerotic calcifications in pelvis. Soft tissue calcification RIGHT groin, nonspecific. IMPRESSION: Mildly displaced RIGHT femoral neck fracture. Electronically Signed   By: Lavonia Dana M.D.   On: 12/05/2022 15:34    Pending Labs Unresulted Labs (From admission, onward)     Start     Ordered   12/06/22 0500  CBC  Daily,   R      12/05/22 1951   12/06/22 [id number]  Basic metabolic panel  Daily,   R      12/05/22 1951            Vitals/Pain Today's Vitals   12/05/22 1430 12/05/22 1805 12/05/22 1830 12/05/22 1925  BP:   128/68   Pulse:   66   Resp:  17 18   Temp:   98.7 F (37.1 C)   TempSrc:   Oral   SpO2:   98%   PainSc: 10-Worst pain ever   10-Worst pain ever    Isolation Precautions No active isolations  Medications Medications  atorvastatin (LIPITOR) tablet 40 mg (has no administration in time range)  carvedilol (COREG) tablet 12.5 mg (has no administration in time range)  midodrine (PROAMATINE) tablet 10 mg (has no administration in time range)  oxyCODONE (Oxy IR/ROXICODONE) immediate release tablet 5-10 mg (has no administration in time range)  HYDROmorphone (DILAUDID) injection 0.5 mg (has no administration in time range)  methocarbamol (ROBAXIN) tablet 500 mg (has no administration in time range)  senna (SENOKOT) tablet 8.6 mg (has no administration in time range)  morphine (PF) 4 MG/ML injection 4 mg (4 mg Intravenous Given 12/05/22 1532)  ondansetron (ZOFRAN) injection 4 mg (4 mg Intravenous Given 12/05/22 1531)  morphine (PF) 4 MG/ML injection 4 mg (4 mg Intravenous Given 12/05/22 1800)  ondansetron (ZOFRAN) injection 4 mg (4 mg Intravenous Given 12/05/22 1751)  sodium zirconium cyclosilicate (LOKELMA) packet 10 g (10 g Oral Given 12/05/22 1803)  HYDROmorphone (DILAUDID) injection 1 mg (1 mg Intravenous Given 12/05/22 1932)    Mobility Cardiac  Assessment Handoff:    Lab Results  Component Value Date   CKTOTAL 116 04/28/2015   CKMB 6.8 (H)  04/28/2015   TROPONINI <0.03 11/06/2015   Lab Results  Component Value Date   DDIMER 3.83 (H) 09/19/2022   Does the Patient currently have chest pain? No BEDREST     Focused Assessments Cardiac Assessment Handoff:    Lab Results  Component Value Date   CKTOTAL 116 04/28/2015   CKMB 6.8 (H) 04/28/2015   TROPONINI <0.03 11/06/2015   Lab Results  Component Value Date   DDIMER 3.83 (H) 09/19/2022   Does the Patient currently have chest pain? No    R Recommendations: See Admitting Provider Note  Report given to:   Additional Notes:  Call with questions

## 2022-12-05 NOTE — ED Triage Notes (Signed)
Patient had a mechanical fall prior to arrival, fell and landed on R hip. Currently painful to touch and hard to move. Not on blood thinners.

## 2022-12-05 NOTE — H&P (Addendum)
History and Physical    Loxley Maclennan [id number] DOB: 1940/09/29 DOA: 12/05/2022  PCP: [facility]   Patient coming from: Home   Chief Complaint: Fall, right hip pain  HPI: Manik Westgate is a pleasant 82 y.o. male with medical history significant for ESRD on hemodialysis, hypertension, history of lower extremity DVT with IVC filter, CAD, and Sjogren disease who presents to the emergency department with right hip pain after mechanical ground-level fall.  Patient reports that he had gone to church and then to lunch today, was having a good day, but then stepped off of a curb without realizing there was an elevation change, and this caused him to fall onto his right side without hitting his head.  He was experiencing immediate and severe right hip pain with inability to bear weight.  He denies any recent illness, fever, chills, or shortness of breath.  He has had chest discomfort with exertion in the past, but none recently and states that he is able to go up at least 1 and probably 2 flights of stairs without stopping.  ED Course: Upon arrival to the ED, patient is found to be afebrile and saturating well on room air with normal heart rate and stable blood pressure.  EKG demonstrates sinus rhythm with first-degree AV nodal block and LVH with repolarization abnormality.  Plain films of the hip and pelvis reveal mildly displaced right femoral neck fracture.  Chest x-ray notable for mild right basilar atelectasis.  Right knee radiographs negative for acute fracture or dislocation.  Labs are most notable for potassium 5.9.  Orthopedic surgery and nephrology were consulted by the ED physician and the patient was treated with Lokelma, Zofran, Dilaudid, and morphine.  Review of Systems:  All other systems reviewed and apart from HPI, are negative.  Past Medical History:  Diagnosis Date   Anemia    Arthritis    Chronic combined systolic and diastolic CHF (congestive heart  failure) ([place])    a. Etiology of low EF not defined.   DVT (deep venous thrombosis) ([place])    a. 01/2015: RLE DVT. VQ scan intermediate probability. Underwent renal bx complicated by perinephric hematoma; anticoagulation stopped and IVC filter placed.   ESRD on hemodialysis ([place]) 02/2015   a. 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 Received plasma exchange and steroids but didn't respond and ended up starting hemodialysis June 2016.    Essential hypertension    GERD (gastroesophageal reflux disease)    History of nuclear stress test    Myoview 9/19 [facility]): low risk    HTN (hypertension)    Hypoalbuminemia    Hypoglycemia    NSTEMI (non-ST elevated myocardial infarction) ([place]) 04/18/2015   OA (osteoarthritis) of knee    Perinephric hematoma 01/2015   Proctitis 07/2015   Protein calorie malnutrition ([place])    Pulmonary fibrosis ([place])    Sjogren's disease ([place]) 01/28/2015    Past Surgical History:  Procedure Laterality Date   AV FISTULA PLACEMENT Right 02/17/2015   Procedure: INSERTION OF RIGHT ARM  ARTERIOVENOUS (AV) GORE-TEX GRAFT ;  Surgeon: Elam Dutch, MD;  Location: [place];  Service: Vascular;  Laterality: Right;   FLEXIBLE SIGMOIDOSCOPY N/A 08/04/2015   Procedure: FLEXIBLE SIGMOIDOSCOPY;  Surgeon: Wilford Corner, MD;  Location: [place];  Service: Endoscopy;  Laterality: N/A;   HEMORROIDECTOMY  1999   INSERTION OF DIALYSIS CATHETER N/A 02/17/2015   Procedure: INSERTION OF DIALYSIS CATHETER RIGHT INTERNAL JUGULAR VEIN;  Surgeon:  Elam Dutch, MD;  [facility];  Service: Vascular;  Laterality: N/A;   MULTIPLE TOOTH EXTRACTIONS     PERIPHERAL VASCULAR BALLOON ANGIOPLASTY Right 01/23/2019   Procedure: PERIPHERAL VASCULAR BALLOON ANGIOPLASTY;  Surgeon: Elam Dutch, MD;  Location: [place] [facility];  Service: Cardiovascular;  Laterality: Right;   REVISION OF ARTERIOVENOUS GORETEX GRAFT Right 03/29/2017    Procedure: REVISION OF ARTERIOVENOUS GORETEX GRAFT;  Surgeon: Conrad White River, MD;  Location: [place];  Service: Vascular;  Laterality: Right;   RIGHT/LEFT HEART CATH AND CORONARY ANGIOGRAPHY N/A 07/06/2022   Procedure: RIGHT/LEFT HEART CATH AND CORONARY ANGIOGRAPHY;  Surgeon: Leonie Man, MD;  Location: [place] [facility];  Service: Cardiovascular;  Laterality: N/A;   Surgical procedure to remove a mole as a child Right Eye area   At around 22 years old    Social History:   reports that he has never smoked. He has never used smokeless tobacco. He reports that he does not drink alcohol and does not use drugs.  No Known Allergies  Family History  Problem Relation Age of Onset   Hypertension Mother    Healthy Father    Hypertension Sister    Hypertension Brother      Prior to Admission medications   Medication Sig Start Date End Date Taking? Authorizing Provider  Accu-Chek Softclix Lancets lancets Use to test blood glucose four times daily as directed. 05/26/21   Thurnell Lose, MD  acetaminophen (TYLENOL) 500 MG tablet Take 500 mg by mouth every 6 (six) hours as needed for moderate pain.    [provider]  albuterol (VENTOLIN HFA) 108 (90 Base) MCG/ACT inhaler Inhale 2 puffs into the lungs every 6 (six) hours as needed for wheezing or shortness of breath. 08/19/22   [provider]  amLODipine (NORVASC) 10 MG tablet Take 1 tablet (10 mg total) by mouth daily. 09/28/22   Freada Bergeron, MD  aspirin EC 81 MG EC tablet Take 1 tablet (81 mg total) by mouth daily. 04/23/15   Reyne Dumas, MD  atorvastatin (LIPITOR) 40 MG tablet Take 1 tablet (40 mg total) by mouth daily at 6 PM. 09/21/22   Mariel Aloe, MD  atropine 1 % ophthalmic solution 1 application into the lower eyelid of affected eye Ophthalmic Once a day    [provider]  Blood Glucose Monitoring Suppl (BLOOD GLUCOSE MONITOR SYSTEM) w/Device KIT Use up to four times daily as directed. 05/26/21    Thurnell Lose, MD  Brinzolamide-Brimonidine 1-0.2 % SUSP Place 1 drop into both eyes in the morning, at noon, and at bedtime. 11/03/21   [provider]  calcitRIOL (ROCALTROL) 0.25 MCG capsule Take 13 capsules (3.25 mcg total) by mouth every Monday, Wednesday, and Friday with hemodialysis. 12/25/21   Cherene Altes, MD  carvedilol (COREG) 12.5 MG tablet Take 1 tablet (12.5 mg total) by mouth 2 (two) times daily with a meal. 09/28/22   Freada Bergeron, MD  cinacalcet (SENSIPAR) 30 MG tablet Take 3 tablets (90 mg total) by mouth every Monday, Wednesday, and Friday with hemodialysis. 12/25/21   Cherene Altes, MD  ferric gluconate 62.5 mg in sodium chloride 0.9 % 100 mL Inject 62.5 mg into the vein every Wednesday with hemodialysis. 12/30/21   Cherene Altes, MD  gabapentin (NEURONTIN) 300 MG capsule Take 300 mg by mouth daily.    [provider]  glucose blood test strip Use to test blood glucose four times  daily as directed 05/26/21   Thurnell Lose, MD  latanoprost (XALATAN) 0.005 % ophthalmic solution Place 1 drop into both eyes at bedtime. 05/05/21   [provider]  methocarbamol (ROBAXIN) 500 MG tablet 1 tablet Three times a day as needed    [provider]  midodrine (PROAMATINE) 10 MG tablet Take 1 tablet (10 mg total) by mouth every Monday, Wednesday, and Friday. Take 10 mg (1 tablet) by mouth before dialysis and 10 mg (1 tablet) at dialysis as directed. 09/29/22   Freada Bergeron, MD  nitroGLYCERIN (NITROSTAT) 0.4 MG SL tablet Place 1 tablet (0.4 mg total) under the tongue every 5 (five) minutes as needed for chest pain (for total of 3 doses at the most). 07/08/22   Domenic Polite, MD  omeprazole (PRILOSEC) 40 MG capsule Take 40 mg by mouth daily. 07/31/15   [provider]  prednisoLONE acetate (PRED FORTE) 1 % ophthalmic suspension 1 drop into affected eye Ophthalmic Twice a day    [provider]  sevelamer  carbonate (RENVELA) 800 MG tablet Take 2 tablets (1,600 mg total) by mouth 3 (three) times daily with meals. 05/17/15   Cherene Altes, MD  timolol (TIMOPTIC-XR) 0.5 % ophthalmic gel-forming Place 1 drop into both eyes daily. 11/03/21   [provider]    Physical Exam: Vitals:   12/05/22 1422 12/05/22 1805 12/05/22 1830  BP: 136/71  128/68  Pulse: 65  66  Resp: 18 17 18   Temp: 98 F (36.7 C)  98.7 F (37.1 C)  TempSrc: Oral  Oral  SpO2: 97%  98%    Constitutional: NAD, calm  Eyes: PERTLA, lids and conjunctivae normal ENMT: Mucous membranes are moist. Posterior pharynx clear of any exudate or lesions.   Neck: supple, no masses  Respiratory:, no wheezing, no crackles. No accessory muscle use.  Cardiovascular: S1 & S2 heard, regular rate and rhythm. No extremity edema.   Abdomen: No distension, no tenderness, soft. Bowel sounds active.  Musculoskeletal: no clubbing / cyanosis. Right hip tender, neurovascularly intact.   Skin: no significant rashes, lesions, ulcers. Warm, dry, well-perfused. Neurologic: CN 2-12 grossly intact. Moving all extremities. Alert and oriented.  Psychiatric: Pleasant. Cooperative.    Labs and Imaging on Admission: I have personally reviewed following labs and imaging studies  CBC: Recent Labs  Lab 12/05/22 1417  WBC 9.6  NEUTROABS 6.9  HGB 12.1*  HCT 38.4*  MCV 95.3  PLT [id number]   Basic Metabolic Panel: Recent Labs  Lab 12/05/22 1417  NA 134*  K 5.9*  CL 93*  CO2 26  GLUCOSE 71  BUN 49*  CREATININE 11.07*  CALCIUM 8.8*   GFR: CrCl cannot be calculated (Unknown ideal weight.). Liver Function Tests: No results for input(s): "AST", "ALT", "ALKPHOS", "BILITOT", "PROT", "ALBUMIN" in the last 168 hours. No results for input(s): "LIPASE", "AMYLASE" in the last 168 hours. No results for input(s): "AMMONIA" in the last 168 hours. Coagulation Profile: No results for input(s): "INR", "PROTIME" in the last 168 hours. Cardiac  Enzymes: No results for input(s): "CKTOTAL", "CKMB", "CKMBINDEX", "TROPONINI" in the last 168 hours. BNP (last 3 results) No results for input(s): "PROBNP" in the last 8760 hours. HbA1C: No results for input(s): "HGBA1C" in the last 72 hours. CBG: No results for input(s): "GLUCAP" in the last 168 hours. Lipid Profile: No results for input(s): "CHOL", "HDL", "LDLCALC", "TRIG", "CHOLHDL", "LDLDIRECT" in the last 72 hours. Thyroid Function Tests: No results for input(s): "TSH", "T4TOTAL", "FREET4", "T3FREE", "THYROIDAB" in  the last 72 hours. Anemia Panel: No results for input(s): "VITAMINB12", "FOLATE", "FERRITIN", "TIBC", "IRON", "RETICCTPCT" in the last 72 hours. Urine analysis:    Component Value Date/Time   COLORURINE AMBER (A) 01/29/2015 2200   APPEARANCEUR CLOUDY (A) 01/29/2015 2200   LABSPEC 1.013 01/29/2015 2200   PHURINE 5.0 01/29/2015 2200   GLUCOSEU NEGATIVE 01/29/2015 2200   HGBUR LARGE (A) 01/29/2015 2200   BILIRUBINUR NEGATIVE 01/29/2015 2200   KETONESUR NEGATIVE 01/29/2015 2200   PROTEINUR 100 (A) 01/29/2015 2200   UROBILINOGEN 0.2 01/29/2015 2200   NITRITE NEGATIVE 01/29/2015 2200   LEUKOCYTESUR NEGATIVE 01/29/2015 2200   Sepsis Labs: @LABRCNTIP (procalcitonin:4,lacticidven:4) )No results found for this or any previous visit (from the past 240 hour(s)).   Radiological Exams on Admission: DG Knee Right Port  Result Date: 12/05/2022 CLINICAL DATA:  Right hip fracture.  Fall EXAM: PORTABLE RIGHT KNEE - 1-2 VIEW COMPARISON:  None Available. FINDINGS: No evidence of fracture, dislocation, or joint effusion. 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 Advanced atherosclerotic vascular calcifications. Soft tissues are otherwise unremarkable. 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 No acute fracture or dislocation. Electronically Signed   By: Davina Poke D.O.   On: 12/05/2022 17:17   DG Chest 1  View  Result Date: 12/05/2022 CLINICAL DATA:  Right hip fracture EXAM: CHEST  1 VIEW COMPARISON:  09/19/2022 FINDINGS: Upper normal heart size. Mediastinal contours and pulmonary vascularity normal. Vascular stent proximal RIGHT subclavian vessel. Atherosclerotic calcification aorta. Minimal RIGHT basilar atelectasis. Lungs otherwise clear. No acute infiltrate, pleural effusion, or pneumothorax. Bones demineralized. IMPRESSION: Mild RIGHT basilar atelectasis. Aortic Atherosclerosis (ICD10-I70.0). Electronically Signed   By: Lavonia Dana M.D.   On: 12/05/2022 15:35   DG Hip Unilat W or Wo Pelvis 2-3 Views Right  Result Date: 12/05/2022 CLINICAL DATA:  Hip pain post fall EXAM: DG HIP (WITH OR WITHOUT PELVIS) 2-3V RIGHT COMPARISON:  08/25/2015 FINDINGS: Osseous demineralization. Hip joint spaces preserved. SI joints less well visualized. Mildly displaced RIGHT femoral neck fracture without dislocation. No additional fracture or dislocation. Scattered atherosclerotic calcifications in pelvis. Soft tissue calcification RIGHT groin, nonspecific. IMPRESSION: Mildly displaced RIGHT femoral neck fracture. Electronically Signed   By: Lavonia Dana M.D.   On: 12/05/2022 15:34    EKG: Independently reviewed. Sinus rhythm, 1st degree AV block, LVH with repolarization abnormality.   Assessment/Plan   1. Right hip fracture  - Orthopedic surgery consulted by ED physician  - Based on the available data, Mr. Mceuen presents an estimated 1.53% risk of perioperative MI or cardiac arrest  - Hold ASA, keep NPO after midnight, continue pain-control and supportive care    2. ESRD; hyperkalemia - Potassium is 5.9 in ED without EKG changes; he appears euvolemic  - Lokelma was given and nephrology consulted by ED  - Restrict fluids, renally-dose medications, continue cardiac monitoring for now, repeat chem panel in am   3. CAD  - No recent angina  - Hold ASA preoperatively, continue Lipitor and Coreg    4. Chronic  HFmrEF  - EF 45-50% on TTE from October 2023   - Appears compensated, volume will be managed with dialysis, continue Coreg     DVT prophylaxis: SCDs  Code Status: Full  Level of Care: Level of care: Telemetry Surgical Family Communication: Wife at bedside   Disposition Plan:  Patient is from: home  Anticipated d/c is to: TBD Anticipated d/c date is: 12/09/22 Patient currently: Pending orthopedic consultation and likely operative hip repair; will likely need  HD this admission  Consults called: orthopedic surgery, nephrology  Admission status: Inpatient     Vianne Bulls, MD [facility]  12/05/2022, 7:51 PM

## 2022-12-05 NOTE — Progress Notes (Signed)
Consult received for displaced right femoral neck fracture after a ground level fall earlier today. X-rays reviewed. Surgery required for pain control and early mobilization OOB. TRH has been consulted for perioperative risk stratification and medical optimization. Nephrology has been consulted for CRF- HD MWF. Plan for surgery tomorrow at 12N. NPO after MN tonight. Hold chemical DVT ppx. Full consult in the am.

## 2022-12-05 NOTE — Progress Notes (Signed)
Returned patient to room and gave face to face bedside report to Charge RN Randall Hiss and Tira. No further questions or concerns voiced from either nurse and no other assistance required from this RN. Patient resting comfortably with no concerns voiced.

## 2022-12-05 NOTE — Anesthesia Procedure Notes (Signed)
Anesthesia Regional Block: Femoral nerve block   Pre-Anesthetic Checklist: , timeout performed,  Correct Patient, Correct Site, Correct Laterality,  Correct Procedure, Correct Position, site marked,  Risks and benefits discussed,  Surgical consent,  Pre-op evaluation,  At surgeon's request and post-op pain management  Laterality: Lower and Right  Prep: chloraprep       Needles:  Injection technique: Single-shot  Needle Type: Stimulator Needle - 80     Needle Length: 9cm  Needle Gauge: 22   Needle insertion depth: 6 cm   Additional Needles:   Procedures:,,,, ultrasound used (permanent image in chart),,    Narrative:  Start time: 12/05/2022 9:30 PM End time: 12/05/2022 9:52 PM Injection made incrementally with aspirations every 5 mL.  Performed by: Personally  Anesthesiologist: Nolon Nations, MD  Additional Notes: BP cuff, EKG monitors applied. Sedation begun. Femoral artery palpated for location of nerve. After nerve location verified with U/S, anesthetic injected incrementally, slowly, and after negative aspirations under direct u/s guidance. Good perineural spread. Patient tolerated well.

## 2022-12-05 NOTE — ED Provider Notes (Signed)
[facility] Provider Note   CSN: [id number] Arrival date & time: 12/05/22  1417     History  Chief Complaint  Patient presents with   Lytle Michaels    Travis [name] is a 82 y.o. male.  82 yo M with a chief complaints of right hip pain.  The patient had gone to church and she had just had lunch and was walking and did not realize there was a step off of the curb and he fell and landed on his right side.  He is complaining of right hip pain and has had some significant difficulty walking since.  He denies head injury denies loss of consciousness denies neck pain chest pain back pain abdominal pain.  Denies other extremity pain.  Denies problems with that hip previously.   Fall       Home Medications Prior to Admission medications   Medication Sig Start Date End Date Taking? Authorizing Provider  Accu-Chek Softclix Lancets lancets Use to test blood glucose four times daily as directed. 05/26/21   Thurnell Lose, MD  acetaminophen (TYLENOL) 500 MG tablet Take 500 mg by mouth every 6 (six) hours as needed for moderate pain.    [provider]  albuterol (VENTOLIN HFA) 108 (90 Base) MCG/ACT inhaler Inhale 2 puffs into the lungs every 6 (six) hours as needed for wheezing or shortness of breath. 08/19/22   [provider]  amLODipine (NORVASC) 10 MG tablet Take 1 tablet (10 mg total) by mouth daily. 09/28/22   Freada Bergeron, MD  aspirin EC 81 MG EC tablet Take 1 tablet (81 mg total) by mouth daily. 04/23/15   Reyne Dumas, MD  atorvastatin (LIPITOR) 40 MG tablet Take 1 tablet (40 mg total) by mouth daily at 6 PM. 09/21/22   Mariel Aloe, MD  atropine 1 % ophthalmic solution 1 application into the lower eyelid of affected eye Ophthalmic Once a day    [provider]  Blood Glucose Monitoring Suppl (BLOOD GLUCOSE MONITOR SYSTEM) w/Device KIT Use up to four times daily as directed. 05/26/21   Thurnell Lose, MD   Brinzolamide-Brimonidine 1-0.2 % SUSP Place 1 drop into both eyes in the morning, at noon, and at bedtime. 11/03/21   [provider]  calcitRIOL (ROCALTROL) 0.25 MCG capsule Take 13 capsules (3.25 mcg total) by mouth every Monday, Wednesday, and Friday with hemodialysis. 12/25/21   Cherene Altes, MD  carvedilol (COREG) 12.5 MG tablet Take 1 tablet (12.5 mg total) by mouth 2 (two) times daily with a meal. 09/28/22   Freada Bergeron, MD  cinacalcet (SENSIPAR) 30 MG tablet Take 3 tablets (90 mg total) by mouth every Monday, Wednesday, and Friday with hemodialysis. 12/25/21   Cherene Altes, MD  ferric gluconate 62.5 mg in sodium chloride 0.9 % 100 mL Inject 62.5 mg into the vein every Wednesday with hemodialysis. 12/30/21   Cherene Altes, MD  gabapentin (NEURONTIN) 300 MG capsule Take 300 mg by mouth daily.    [provider]  glucose blood test strip Use to test blood glucose four times daily as directed 05/26/21   Thurnell Lose, MD  latanoprost (XALATAN) 0.005 % ophthalmic solution Place 1 drop into both eyes at bedtime. 05/05/21   [provider]  methocarbamol (ROBAXIN) 500 MG tablet 1 tablet Three times a day as needed    [provider]  midodrine (PROAMATINE) 10 MG tablet Take 1 tablet (10 mg total)  by mouth every Monday, Wednesday, and Friday. Take 10 mg (1 tablet) by mouth before dialysis and 10 mg (1 tablet) at dialysis as directed. 09/29/22   Freada Bergeron, MD  nitroGLYCERIN (NITROSTAT) 0.4 MG SL tablet Place 1 tablet (0.4 mg total) under the tongue every 5 (five) minutes as needed for chest pain (for total of 3 doses at the most). 07/08/22   Domenic Polite, MD  omeprazole (PRILOSEC) 40 MG capsule Take 40 mg by mouth daily. 07/31/15   [provider]  prednisoLONE acetate (PRED FORTE) 1 % ophthalmic suspension 1 drop into affected eye Ophthalmic Twice a day    [provider]  sevelamer carbonate (RENVELA) 800 MG  tablet Take 2 tablets (1,600 mg total) by mouth 3 (three) times daily with meals. 05/17/15   Cherene Altes, MD  timolol (TIMOPTIC-XR) 0.5 % ophthalmic gel-forming Place 1 drop into both eyes daily. 11/03/21   [provider]      Allergies    Patient has no known allergies.    Review of Systems   Review of Systems  Physical Exam Updated Vital Signs BP 128/68   Pulse 66   Temp 98.7 F (37.1 C) (Oral)   Resp 18   SpO2 98%  Physical Exam Vitals and nursing note reviewed.  Constitutional:      Appearance: He is well-developed.  HENT:     Head: Normocephalic and atraumatic.  Eyes:     Pupils: Pupils are equal, round, and reactive to light.  Neck:     Vascular: No JVD.  Cardiovascular:     Rate and Rhythm: Normal rate and regular rhythm.     Heart sounds: No murmur heard.    No friction rub. No gallop.  Pulmonary:     Effort: No respiratory distress.     Breath sounds: No wheezing.  Abdominal:     General: There is no distension.     Tenderness: There is no abdominal tenderness. There is no guarding or rebound.  Musculoskeletal:        General: Normal range of motion.     Cervical back: Normal range of motion and neck supple.     Comments: Right AV fistula with palpable thrill  Pain along the right greater trochanter, mild pain with internal extra rotation of the right lower extremity.  No obvious femoral instability or crepitus or pain.  Pulse motor and sensation intact distally.  No obvious midline spinal tenderness step-offs or deformities.  Palpated from head to toe without any other noted areas of bony tenderness.  Skin:    Coloration: Skin is not pale.     Findings: No rash.  Neurological:     Mental Status: He is alert and oriented to person, place, and time.  Psychiatric:        Behavior: Behavior normal.     ED Results / Procedures / Treatments   Labs (all labs ordered are listed, but only abnormal results are displayed) Labs Reviewed  BASIC  METABOLIC PANEL - Abnormal; Notable for the following components:      Result Value   Sodium 134 (*)    Potassium 5.9 (*)    Chloride 93 (*)    BUN 49 (*)    Creatinine, Ser 11.07 (*)    Calcium 8.8 (*)    GFR, Estimated 4 (*)    All other components within normal limits  CBC WITH DIFFERENTIAL/PLATELET - Abnormal; Notable for the following components:   RBC 4.03 (*)  Hemoglobin 12.1 (*)    HCT 38.4 (*)    RDW 17.9 (*)    Monocytes Absolute 1.4 (*)    All other components within normal limits  CBC WITH DIFFERENTIAL/PLATELET  TYPE AND SCREEN    EKG EKG Interpretation  Date/Time:  Sunday December 05 2022 15:15:06 EDT Ventricular Rate:  69 PR Interval:  269 QRS Duration: 104 QT Interval:  461 QTC Calculation: 494 R Axis:   -42 Text Interpretation: Sinus rhythm Prolonged PR interval LVH with secondary repolarization abnormality Anterior Q waves, possibly due to LVH No significant change since last tracing Confirmed by Deno Etienne [phone]) on 12/05/2022 3:32:43 PM  Radiology DG Knee Right Port  Result Date: 12/05/2022 CLINICAL DATA:  Right hip fracture.  Fall EXAM: PORTABLE RIGHT KNEE - 1-2 VIEW COMPARISON:  None Available. FINDINGS: No evidence of fracture, dislocation, or joint effusion. 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 Advanced atherosclerotic vascular calcifications. Soft tissues are otherwise unremarkable. 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 No acute fracture or dislocation. Electronically Signed   By: Davina Poke D.O.   On: 12/05/2022 17:17   DG Chest 1 View  Result Date: 12/05/2022 CLINICAL DATA:  Right hip fracture EXAM: CHEST  1 VIEW COMPARISON:  09/19/2022 FINDINGS: Upper normal heart size. Mediastinal contours and pulmonary vascularity normal. Vascular stent proximal RIGHT subclavian vessel. Atherosclerotic calcification aorta. Minimal RIGHT basilar atelectasis. Lungs otherwise clear. No  acute infiltrate, pleural effusion, or pneumothorax. Bones demineralized. IMPRESSION: Mild RIGHT basilar atelectasis. Aortic Atherosclerosis (ICD10-I70.0). Electronically Signed   By: Lavonia Dana M.D.   On: 12/05/2022 15:35   DG Hip Unilat W or Wo Pelvis 2-3 Views Right  Result Date: 12/05/2022 CLINICAL DATA:  Hip pain post fall EXAM: DG HIP (WITH OR WITHOUT PELVIS) 2-3V RIGHT COMPARISON:  08/25/2015 FINDINGS: Osseous demineralization. Hip joint spaces preserved. SI joints less well visualized. Mildly displaced RIGHT femoral neck fracture without dislocation. No additional fracture or dislocation. Scattered atherosclerotic calcifications in pelvis. Soft tissue calcification RIGHT groin, nonspecific. IMPRESSION: Mildly displaced RIGHT femoral neck fracture. Electronically Signed   By: Lavonia Dana M.D.   On: 12/05/2022 15:34    Procedures .Critical Care  Performed by: Deno Etienne, DO Authorized by: Deno Etienne, DO   Critical care provider statement:    Critical care time (minutes):  35   Critical care time was exclusive of:  Separately billable procedures and treating other patients   Critical care was time spent personally by me on the following activities:  Development of treatment plan with patient or surrogate, discussions with consultants, evaluation of patient's response to treatment, examination of patient, ordering and review of laboratory studies, ordering and review of radiographic studies, ordering and performing treatments and interventions, pulse oximetry, re-evaluation of patient's condition and review of old charts   Care discussed with: admitting provider       Medications Ordered in ED Medications  morphine (PF) 4 MG/ML injection 4 mg (4 mg Intravenous Given 12/05/22 1532)  ondansetron (ZOFRAN) injection 4 mg (4 mg Intravenous Given 12/05/22 1531)  morphine (PF) 4 MG/ML injection 4 mg (4 mg Intravenous Given 12/05/22 1800)  ondansetron (ZOFRAN) injection 4 mg (4 mg Intravenous Given  12/05/22 1751)  sodium zirconium cyclosilicate (LOKELMA) packet 10 g (10 g Oral Given 12/05/22 1803)  HYDROmorphone (DILAUDID) injection 1 mg (1 mg Intravenous Given 12/05/22 1932)    ED Course/ Medical Decision Making/ A&P  Medical Decision Making Amount and/or Complexity of Data Reviewed Labs: ordered. Radiology: ordered. ECG/medicine tests: ordered.  Risk Prescription drug management. Decision regarding hospitalization.   82 yo M with a chief complaint of right hip pain after suffering a fall.  Nonsyncopal by history.  Has a lot of pain with trying to bear weight and hard to move that leg.  Concerning for fracture.  Will obtain a chest x-ray blood work plain film of the right hip.  Treat pain.  Reassess.  Plain film of the right hip independently interpreted by me with a right femoral neck fracture.  Will discuss with orthopedics.  Plain film of the chest independently interpreted by me without focal infiltrate and pneumothorax.  I discussed case with Dr. Lyla Glassing, orthopedics.  Will plan for operative repair.  Patient's metabolic panel has resulted with a mild hyperkalemia.  EKG without concerning changes.  Will give a dose of Lokelma here. Discussed with Dr. Jonnie Finner will arrange for dialysis in the hospital.   The patients results and plan were reviewed and discussed.   Any x-rays performed were independently reviewed by myself.   Differential diagnosis were considered with the presenting HPI.  Medications  morphine (PF) 4 MG/ML injection 4 mg (4 mg Intravenous Given 12/05/22 1532)  ondansetron (ZOFRAN) injection 4 mg (4 mg Intravenous Given 12/05/22 1531)  morphine (PF) 4 MG/ML injection 4 mg (4 mg Intravenous Given 12/05/22 1800)  ondansetron (ZOFRAN) injection 4 mg (4 mg Intravenous Given 12/05/22 1751)  sodium zirconium cyclosilicate (LOKELMA) packet 10 g (10 g Oral Given 12/05/22 1803)  HYDROmorphone (DILAUDID) injection 1 mg (1 mg Intravenous  Given 12/05/22 1932)    Vitals:   12/05/22 1422 12/05/22 1805 12/05/22 1830  BP: 136/71  128/68  Pulse: 65  66  Resp: 18 17 18   Temp: 98 F (36.7 C)  98.7 F (37.1 C)  TempSrc: Oral  Oral  SpO2: 97%  98%    Final diagnoses:  Closed displaced fracture of right femoral neck (HCC)    Admission/ observation were discussed with the admitting physician, patient and/or family and they are comfortable with the plan.    The patients results and plan were reviewed and discussed.   Any x-rays performed were independently reviewed by myself.   Differential diagnosis were considered with the presenting HPI.  Medications  morphine (PF) 4 MG/ML injection 4 mg (4 mg Intravenous Given 12/05/22 1532)  ondansetron (ZOFRAN) injection 4 mg (4 mg Intravenous Given 12/05/22 1531)  morphine (PF) 4 MG/ML injection 4 mg (4 mg Intravenous Given 12/05/22 1800)  ondansetron (ZOFRAN) injection 4 mg (4 mg Intravenous Given 12/05/22 1751)  sodium zirconium cyclosilicate (LOKELMA) packet 10 g (10 g Oral Given 12/05/22 1803)  HYDROmorphone (DILAUDID) injection 1 mg (1 mg Intravenous Given 12/05/22 1932)    Vitals:   12/05/22 1422 12/05/22 1805 12/05/22 1830  BP: 136/71  128/68  Pulse: 65  66  Resp: 18 17 18   Temp: 98 F (36.7 C)  98.7 F (37.1 C)  TempSrc: Oral  Oral  SpO2: 97%  98%    Final diagnoses:  Closed displaced fracture of right femoral neck (HCC)    Admission/ observation were discussed with the admitting physician, patient and/or family and they are comfortable with the plan.           Final Clinical Impression(s) / ED Diagnoses Final diagnoses:  Closed displaced fracture of right femoral neck ([place])    Rx / DC Orders ED Discharge Orders  None         Deno Etienne, DO 12/05/22 1943

## 2022-12-06 ENCOUNTER — Inpatient Hospital Stay (HOSPITAL_COMMUNITY): Payer: No Typology Code available for payment source | Admitting: Certified Registered"

## 2022-12-06 ENCOUNTER — Encounter (HOSPITAL_COMMUNITY): Payer: Self-pay | Admitting: Family Medicine

## 2022-12-06 ENCOUNTER — Other Ambulatory Visit: Payer: Self-pay

## 2022-12-06 ENCOUNTER — Inpatient Hospital Stay (HOSPITAL_COMMUNITY): Payer: No Typology Code available for payment source

## 2022-12-06 ENCOUNTER — Encounter (HOSPITAL_COMMUNITY)
Admission: EM | Disposition: A | Discharge status: Home Health | Payer: Self-pay | Source: Home / Self Care | Attending: Internal Medicine

## 2022-12-06 DIAGNOSIS — I110 Hypertensive heart disease with heart failure: Secondary | ICD-10-CM

## 2022-12-06 DIAGNOSIS — I2510 Atherosclerotic heart disease of native coronary artery without angina pectoris: Secondary | ICD-10-CM | POA: Diagnosis not present

## 2022-12-06 DIAGNOSIS — S72001A Fracture of unspecified part of neck of right femur, initial encounter for closed fracture: Secondary | ICD-10-CM | POA: Diagnosis not present

## 2022-12-06 DIAGNOSIS — I252 Old myocardial infarction: Secondary | ICD-10-CM

## 2022-12-06 DIAGNOSIS — I509 Heart failure, unspecified: Secondary | ICD-10-CM

## 2022-12-06 HISTORY — PX: TOTAL HIP ARTHROPLASTY: SHX124

## 2022-12-06 LAB — CBC
HCT: 38.8 % — ABNORMAL LOW (ref 39.0–52.0)
Hemoglobin: 12 g/dL — ABNORMAL LOW (ref 13.0–17.0)
MCH: 29.6 pg (ref 26.0–34.0)
MCHC: 30.9 g/dL (ref 30.0–36.0)
MCV: 95.6 fL (ref 80.0–100.0)
Platelets: 211 10*3/uL (ref 150–400)
RBC: 4.06 MIL/uL — ABNORMAL LOW (ref 4.22–5.81)
RDW: 17.9 % — ABNORMAL HIGH (ref 11.5–15.5)
WBC: 10.2 10*3/uL (ref 4.0–10.5)
nRBC: 0 % (ref 0.0–0.2)

## 2022-12-06 LAB — POCT I-STAT, CHEM 8
BUN: 51 mg/dL — ABNORMAL HIGH (ref 8–23)
Calcium, Ion: 1.07 mmol/L — ABNORMAL LOW (ref 1.15–1.40)
Chloride: 94 mmol/L — ABNORMAL LOW (ref 98–111)
Creatinine, Ser: 9.8 mg/dL — ABNORMAL HIGH (ref 0.61–1.24)
Glucose, Bld: 103 mg/dL — ABNORMAL HIGH (ref 70–99)
HCT: 39 % (ref 39.0–52.0)
Hemoglobin: 13.3 g/dL (ref 13.0–17.0)
Potassium: 6.1 mmol/L — ABNORMAL HIGH (ref 3.5–5.1)
Sodium: 135 mmol/L (ref 135–145)
TCO2: 34 mmol/L — ABNORMAL HIGH (ref 22–32)

## 2022-12-06 LAB — BASIC METABOLIC PANEL
Anion gap: 16 — ABNORMAL HIGH (ref 5–15)
BUN: 64 mg/dL — ABNORMAL HIGH (ref 8–23)
CO2: 29 mmol/L (ref 22–32)
Calcium: 8.5 mg/dL — ABNORMAL LOW (ref 8.9–10.3)
Chloride: 90 mmol/L — ABNORMAL LOW (ref 98–111)
Creatinine, Ser: 11.98 mg/dL — ABNORMAL HIGH (ref 0.61–1.24)
GFR, Estimated: 4 mL/min — ABNORMAL LOW (ref >60)
Glucose, Bld: 114 mg/dL — ABNORMAL HIGH (ref 70–99)
Potassium: 6.4 mmol/L (ref 3.5–5.1)
Sodium: 135 mmol/L (ref 135–145)

## 2022-12-06 LAB — ALBUMIN: Albumin: 3.7 g/dL (ref 3.5–5.0)

## 2022-12-06 LAB — HEPATITIS B CORE ANTIBODY, IGM: Hep B C IgM: NONREACTIVE

## 2022-12-06 LAB — MRSA NEXT GEN BY PCR, NASAL: MRSA by PCR Next Gen: NOT DETECTED

## 2022-12-06 LAB — HEPATITIS B SURFACE ANTIGEN: Hepatitis B Surface Ag: NONREACTIVE

## 2022-12-06 LAB — HEPATITIS A ANTIBODY, TOTAL: hep A Total Ab: REACTIVE — AB

## 2022-12-06 LAB — PHOSPHORUS: Phosphorus: 7.8 mg/dL — ABNORMAL HIGH (ref 2.5–4.6)

## 2022-12-06 SURGERY — ARTHROPLASTY, HIP, TOTAL, ANTERIOR APPROACH
Anesthesia: Monitor Anesthesia Care | Site: Hip | Laterality: Right

## 2022-12-06 MED ORDER — ALBUTEROL SULFATE (2.5 MG/3ML) 0.083% IN NEBU: 2.5000 mg | INHALATION_SOLUTION | Freq: Four times a day (QID) | RESPIRATORY_TRACT | Status: DC | PRN

## 2022-12-06 MED ORDER — TIMOLOL MALEATE 0.5 % OP SOLG
1.0000 [drp] | Freq: Every day | OPHTHALMIC | Status: DC
  Administered 2022-12-06 – 2022-12-08 (×3): 1 [drp] via OPHTHALMIC
  Filled 2022-12-06: qty 5

## 2022-12-06 MED ORDER — PANTOPRAZOLE SODIUM 40 MG PO TBEC: 40.0000 mg | DELAYED_RELEASE_TABLET | Freq: Every day | ORAL | Status: DC

## 2022-12-06 MED ORDER — CEFAZOLIN SODIUM-DEXTROSE 2-4 GM/100ML-% IV SOLN
2.0000 g | INTRAVENOUS | Status: AC
  Administered 2022-12-06: 2 g via INTRAVENOUS
  Filled 2022-12-06: qty 100

## 2022-12-06 MED ORDER — POVIDONE-IODINE 10 % EX SWAB
2.0000 | Freq: Once | CUTANEOUS | Status: AC
  Administered 2022-12-06: 2 via TOPICAL

## 2022-12-06 MED ORDER — NEPRO/CARBSTEADY PO LIQD
237.0000 mL | Freq: Two times a day (BID) | ORAL | Status: DC
  Administered 2022-12-07 – 2022-12-08 (×2): 237 mL via ORAL

## 2022-12-06 MED ORDER — RENA-VITE PO TABS
1.0000 | ORAL_TABLET | Freq: Every day | ORAL | Status: DC
  Administered 2022-12-07: 1 via ORAL
  Filled 2022-12-06: qty 1

## 2022-12-06 MED ORDER — PHENOL 1.4 % MT LIQD: 1.0000 | OROMUCOSAL | Status: DC | PRN

## 2022-12-06 MED ORDER — SODIUM CHLORIDE 0.9 % IV SOLN: INTRAVENOUS | Status: DC

## 2022-12-06 MED ORDER — CHLORHEXIDINE GLUCONATE 4 % EX LIQD
60.0000 mL | Freq: Once | CUTANEOUS | Status: AC
  Administered 2022-12-06: 4 via TOPICAL

## 2022-12-06 MED ORDER — SENNA 8.6 MG PO TABS
1.0000 | ORAL_TABLET | Freq: Two times a day (BID) | ORAL | Status: DC
  Administered 2022-12-06 – 2022-12-08 (×4): 8.6 mg via ORAL
  Filled 2022-12-06 (×4): qty 1

## 2022-12-06 MED ORDER — ACETAMINOPHEN 500 MG PO TABS
1000.0000 mg | ORAL_TABLET | Freq: Once | ORAL | Status: AC
  Administered 2022-12-06: 1000 mg via ORAL
  Filled 2022-12-06: qty 2

## 2022-12-06 MED ORDER — AMISULPRIDE (ANTIEMETIC) 5 MG/2ML IV SOLN: 10.0000 mg | Freq: Once | INTRAVENOUS | Status: DC | PRN

## 2022-12-06 MED ORDER — KETOROLAC TROMETHAMINE 30 MG/ML IJ SOLN
INTRAMUSCULAR | Status: AC
  Filled 2022-12-06: qty 1

## 2022-12-06 MED ORDER — CEFAZOLIN SODIUM-DEXTROSE 2-4 GM/100ML-% IV SOLN
2.0000 g | Freq: Four times a day (QID) | INTRAVENOUS | Status: AC
  Administered 2022-12-06 – 2022-12-07 (×2): 2 g via INTRAVENOUS
  Filled 2022-12-06 (×2): qty 100

## 2022-12-06 MED ORDER — BISACODYL 10 MG RE SUPP: 10.0000 mg | Freq: Every day | RECTAL | Status: DC | PRN

## 2022-12-06 MED ORDER — LATANOPROST 0.005 % OP SOLN
1.0000 [drp] | Freq: Every day | OPHTHALMIC | Status: DC
  Administered 2022-12-06 – 2022-12-07 (×2): 1 [drp] via OPHTHALMIC
  Filled 2022-12-06: qty 2.5

## 2022-12-06 MED ORDER — BUPIVACAINE HCL (PF) 0.5 % IJ SOLN
INTRAMUSCULAR | Status: AC
  Filled 2022-12-06: qty 30

## 2022-12-06 MED ORDER — BUPIVACAINE IN DEXTROSE 0.75-8.25 % IT SOLN
INTRATHECAL | Status: DC | PRN
  Administered 2022-12-06: 1.7 mL via INTRATHECAL

## 2022-12-06 MED ORDER — ORAL CARE MOUTH RINSE: 15.0000 mL | Freq: Once | OROMUCOSAL | Status: AC

## 2022-12-06 MED ORDER — NITROGLYCERIN 0.4 MG SL SUBL: 0.4000 mg | SUBLINGUAL_TABLET | SUBLINGUAL | Status: DC | PRN

## 2022-12-06 MED ORDER — 0.9 % SODIUM CHLORIDE (POUR BTL) OPTIME
TOPICAL | Status: DC | PRN
  Administered 2022-12-06: 1000 mL

## 2022-12-06 MED ORDER — TRANEXAMIC ACID-NACL 1000-0.7 MG/100ML-% IV SOLN
1000.0000 mg | INTRAVENOUS | Status: AC
  Administered 2022-12-06: 1000 mg via INTRAVENOUS
  Filled 2022-12-06: qty 100

## 2022-12-06 MED ORDER — PROPOFOL 1000 MG/100ML IV EMUL
INTRAVENOUS | Status: AC
  Filled 2022-12-06: qty 100

## 2022-12-06 MED ORDER — SODIUM CHLORIDE (PF) 0.9 % IJ SOLN
INTRAMUSCULAR | Status: DC | PRN
  Administered 2022-12-06: 61 mL

## 2022-12-06 MED ORDER — PANTOPRAZOLE SODIUM 40 MG PO TBEC
40.0000 mg | DELAYED_RELEASE_TABLET | Freq: Every day | ORAL | Status: DC
  Administered 2022-12-06 – 2022-12-08 (×3): 40 mg via ORAL
  Filled 2022-12-06 (×3): qty 1

## 2022-12-06 MED ORDER — ENOXAPARIN SODIUM 40 MG/0.4ML IJ SOSY: 40.0000 mg | PREFILLED_SYRINGE | INTRAMUSCULAR | Status: DC

## 2022-12-06 MED ORDER — MENTHOL 3 MG MT LOZG: 1.0000 | LOZENGE | OROMUCOSAL | Status: DC | PRN

## 2022-12-06 MED ORDER — PHENYLEPHRINE HCL-NACL 20-0.9 MG/250ML-% IV SOLN
INTRAVENOUS | Status: AC
  Filled 2022-12-06: qty 250

## 2022-12-06 MED ORDER — DOCUSATE SODIUM 100 MG PO CAPS
100.0000 mg | ORAL_CAPSULE | Freq: Two times a day (BID) | ORAL | Status: DC
  Administered 2022-12-06 – 2022-12-08 (×4): 100 mg via ORAL
  Filled 2022-12-06 (×4): qty 1

## 2022-12-06 MED ORDER — FENTANYL CITRATE (PF) 100 MCG/2ML IJ SOLN: 25.0000 ug | INTRAMUSCULAR | Status: DC | PRN

## 2022-12-06 MED ORDER — METOCLOPRAMIDE HCL 5 MG/ML IJ SOLN
5.0000 mg | Freq: Three times a day (TID) | INTRAMUSCULAR | Status: DC | PRN
  Administered 2022-12-08: 10 mg via INTRAVENOUS
  Filled 2022-12-06: qty 2

## 2022-12-06 MED ORDER — POLYETHYLENE GLYCOL 3350 17 G PO PACK: 17.0000 g | PACK | Freq: Every day | ORAL | Status: DC | PRN

## 2022-12-06 MED ORDER — ONDANSETRON HCL 4 MG/2ML IJ SOLN
INTRAMUSCULAR | Status: DC | PRN
  Administered 2022-12-06: 4 mg via INTRAVENOUS

## 2022-12-06 MED ORDER — SODIUM CHLORIDE 0.9 % IR SOLN
Status: DC | PRN
  Administered 2022-12-06: 1000 mL
  Administered 2022-12-06: 3000 mL

## 2022-12-06 MED ORDER — PROPOFOL 500 MG/50ML IV EMUL
INTRAVENOUS | Status: DC | PRN
  Administered 2022-12-06 (×2): 10 mg via INTRAVENOUS
  Administered 2022-12-06: 80 ug/kg/min via INTRAVENOUS

## 2022-12-06 MED ORDER — GABAPENTIN 300 MG PO CAPS
300.0000 mg | ORAL_CAPSULE | Freq: Every day | ORAL | Status: DC
  Administered 2022-12-06 – 2022-12-08 (×3): 300 mg via ORAL
  Filled 2022-12-06 (×3): qty 1

## 2022-12-06 MED ORDER — METOCLOPRAMIDE HCL 5 MG PO TABS: 5.0000 mg | ORAL_TABLET | Freq: Three times a day (TID) | ORAL | Status: DC | PRN

## 2022-12-06 MED ORDER — CHLORHEXIDINE GLUCONATE 0.12 % MT SOLN
15.0000 mL | Freq: Once | OROMUCOSAL | Status: AC
  Administered 2022-12-06: 15 mL via OROMUCOSAL
  Filled 2022-12-06: qty 15

## 2022-12-06 MED ORDER — SODIUM ZIRCONIUM CYCLOSILICATE 10 G PO PACK
10.0000 g | PACK | Freq: Once | ORAL | Status: AC
  Administered 2022-12-06: 10 g via ORAL
  Filled 2022-12-06: qty 1

## 2022-12-06 SURGICAL SUPPLY — 67 items
ADH SKN CLS APL DERMABOND .7 (GAUZE/BANDAGES/DRESSINGS) ×2
ALCOHOL 70% 16 OZ (MISCELLANEOUS) ×2 IMPLANT
APL PRP STRL LF DISP 70% ISPRP (MISCELLANEOUS) ×1
BAG COUNTER SPONGE SURGICOUNT (BAG) ×2 IMPLANT
BAG SPNG CNTER NS LX DISP (BAG) ×1
BLADE CLIPPER SURG (BLADE) IMPLANT
CHLORAPREP W/TINT 26 (MISCELLANEOUS) ×2 IMPLANT
COVER SURGICAL LIGHT HANDLE (MISCELLANEOUS) ×2 IMPLANT
DERMABOND ADVANCED .7 DNX12 (GAUZE/BANDAGES/DRESSINGS) ×4 IMPLANT
DRAPE C-ARM 42X72 X-RAY (DRAPES) ×2 IMPLANT
DRAPE STERI IOBAN 125X83 (DRAPES) ×2 IMPLANT
DRAPE U-SHAPE 47X51 STRL (DRAPES) ×6 IMPLANT
DRSG AQUACEL AG ADV 3.5X10 (GAUZE/BANDAGES/DRESSINGS) ×2 IMPLANT
ELECT BLADE 4.0 EZ CLEAN MEGAD (MISCELLANEOUS) ×1
ELECT PENCIL ROCKER SW 15FT (MISCELLANEOUS) ×2 IMPLANT
ELECT REM PT RETURN 9FT ADLT (ELECTROSURGICAL) ×1
ELECTRODE BLDE 4.0 EZ CLN MEGD (MISCELLANEOUS) ×2 IMPLANT
ELECTRODE REM PT RTRN 9FT ADLT (ELECTROSURGICAL) ×2 IMPLANT
EVACUATOR 1/8 PVC DRAIN (DRAIN) IMPLANT
FACESHIELD WRAPAROUND (MASK) ×1 IMPLANT
FACESHIELD WRAPAROUND OR TEAM (MASK) IMPLANT
GLOVE BIO SURGEON STRL SZ8.5 (GLOVE) ×4 IMPLANT
GLOVE BIOGEL M 7.0 STRL (GLOVE) ×2 IMPLANT
GLOVE BIOGEL PI IND STRL 7.5 (GLOVE) ×2 IMPLANT
GLOVE BIOGEL PI IND STRL 8.5 (GLOVE) ×2 IMPLANT
GOWN STRL REUS W/ TWL LRG LVL3 (GOWN DISPOSABLE) ×4 IMPLANT
GOWN STRL REUS W/ TWL XL LVL3 (GOWN DISPOSABLE) ×2 IMPLANT
GOWN STRL REUS W/TWL 2XL LVL3 (GOWN DISPOSABLE) ×2 IMPLANT
GOWN STRL REUS W/TWL LRG LVL3 (GOWN DISPOSABLE) ×2
GOWN STRL REUS W/TWL XL LVL3 (GOWN DISPOSABLE) ×1
HANDPIECE INTERPULSE COAX TIP (DISPOSABLE) ×1
HD FEM MOD 36M STD (Miscellaneous) ×1 IMPLANT
HEAD FEM MOD 36M STD (Miscellaneous) IMPLANT
HOOD W/PEELAWAY (MISCELLANEOUS) ×4 IMPLANT
JET LAVAGE IRRISEPT WOUND (IRRIGATION / IRRIGATOR) ×1
KIT BASIN OR (CUSTOM PROCEDURE TRAY) ×2 IMPLANT
KIT TURNOVER KIT B (KITS) ×2 IMPLANT
LAVAGE JET IRRISEPT WOUND (IRRIGATION / IRRIGATOR) ×2 IMPLANT
LINER ACETAB NN G7 F 36 (Liner) IMPLANT
MANIFOLD NEPTUNE II (INSTRUMENTS) ×2 IMPLANT
MARKER SKIN DUAL TIP RULER LAB (MISCELLANEOUS) ×4 IMPLANT
NDL SPNL 18GX3.5 QUINCKE PK (NEEDLE) ×2 IMPLANT
NEEDLE SPNL 18GX3.5 QUINCKE PK (NEEDLE) ×1 IMPLANT
NS IRRIG 1000ML POUR BTL (IV SOLUTION) ×2 IMPLANT
PACK TOTAL JOINT (CUSTOM PROCEDURE TRAY) ×2 IMPLANT
PACK UNIVERSAL I (CUSTOM PROCEDURE TRAY) ×2 IMPLANT
PAD ARMBOARD 7.5X6 YLW CONV (MISCELLANEOUS) ×4 IMPLANT
SAW OSC TIP CART 19.5X105X1.3 (SAW) ×2 IMPLANT
SEALER BIPOLAR AQUA 6.0 (INSTRUMENTS) IMPLANT
SET HNDPC FAN SPRY TIP SCT (DISPOSABLE) ×2 IMPLANT
SHELL ACET G7 4H 56 SZF (Shell) IMPLANT
SOL PREP POV-IOD 4OZ 10% (MISCELLANEOUS) ×2 IMPLANT
STEM FEMORAL 17X154 (Stem) IMPLANT
SUT ETHIBOND NAB CT1 #1 30IN (SUTURE) IMPLANT
SUT MNCRL AB 3-0 PS2 18 (SUTURE) IMPLANT
SUT MON AB 2-0 CT1 36 (SUTURE) ×2 IMPLANT
SUT VIC AB 2-0 CT1 27 (SUTURE) ×1
SUT VIC AB 2-0 CT1 TAPERPNT 27 (SUTURE) ×2 IMPLANT
SUT VLOC 180 0 24IN GS25 (SUTURE) ×2 IMPLANT
SYR 50ML LL SCALE MARK (SYRINGE) ×2 IMPLANT
TOWEL GREEN STERILE (TOWEL DISPOSABLE) ×2 IMPLANT
TOWEL GREEN STERILE FF (TOWEL DISPOSABLE) ×2 IMPLANT
TRAY CATH INTERMITTENT SS 16FR (CATHETERS) IMPLANT
TRAY FOLEY W/BAG SLVR 16FR (SET/KITS/TRAYS/PACK)
TRAY FOLEY W/BAG SLVR 16FR ST (SET/KITS/TRAYS/PACK) IMPLANT
TUBE SUCT ARGYLE STRL (TUBING) ×2 IMPLANT
WATER STERILE IRR 1000ML POUR (IV SOLUTION) ×6 IMPLANT

## 2022-12-06 NOTE — Progress Notes (Signed)
[facility] on call notified of critical K+ level-6.4. order for him to be 1st for HD.

## 2022-12-06 NOTE — Discharge Instructions (Signed)
 Dr. Brian Swinteck Joint Replacement Specialist [facility] [address] [place], [place] [address] [phone]   TOTAL HIP REPLACEMENT POSTOPERATIVE DIRECTIONS    Hip Rehabilitation, Guidelines Following Surgery   WEIGHT BEARING Weight bearing as tolerated with assist device (walker, cane, etc) as directed, use it as long as suggested by your surgeon or therapist, typically at least 4-6 weeks.  The results of a hip operation are greatly improved after range of motion and muscle strengthening exercises. Follow all safety measures which are given to protect your hip. If any of these exercises cause increased pain or swelling in your joint, decrease the amount until you are comfortable again. Then slowly increase the exercises. Call your caregiver if you have problems or questions.   HOME CARE INSTRUCTIONS  Most of the following instructions are designed to prevent the dislocation of your new hip.  Remove items at home which could result in a fall. This includes throw rugs or furniture in walking pathways.  Continue medications as instructed at time of discharge. You may have some home medications which will be placed on hold until you complete the course of blood thinner medication. You may start showering once you are discharged home. Do not remove your dressing. Do not put on socks or shoes without following the instructions of your caregivers.   Sit on chairs with arms. Use the chair arms to help push yourself up when arising.  Arrange for the use of a toilet seat elevator so you are not sitting low.  Walk with walker as instructed.  You may resume a sexual relationship in one month or when given the OK by your caregiver.  Use walker as long as suggested by your caregivers.  You may put full weight on your legs and walk as much as is comfortable. Avoid periods of inactivity such as sitting longer than an hour when not asleep. This helps prevent blood  clots.  You may return to work once you are cleared by your surgeon.  Do not drive a car for 6 weeks or until released by your surgeon.  Do not drive while taking narcotics.  Wear elastic stockings for two weeks following surgery during the day but you may remove then at night.  Make sure you keep all of your appointments after your operation with all of your doctors and caregivers. You should call the office at the above phone number and make an appointment for approximately two weeks after the date of your surgery. Please pick up a stool softener and laxative for home use as long as you are requiring pain medications. ICE to the affected hip every three hours for 30 minutes at a time and then as needed for pain and swelling. Continue to use ice on the hip for pain and swelling from surgery. You may notice swelling that will progress down to the foot and ankle.  This is normal after surgery.  Elevate the leg when you are not up walking on it.   It is important for you to complete the blood thinner medication as prescribed by your doctor. Continue to use the breathing machine which will help keep your temperature down.  It is common for your temperature to cycle up and down following surgery, especially at night when you are not up moving around and exerting yourself.  The breathing machine keeps your lungs expanded and your temperature down.  RANGE OF MOTION AND STRENGTHENING EXERCISES  These exercises are designed to help you   keep full movement of your hip joint. Follow your caregiver's or physical therapist's instructions. Perform all exercises about fifteen times, three times per day or as directed. Exercise both hips, even if you have had only one joint replacement. These exercises can be done on a training (exercise) mat, on the floor, on a table or on a bed. Use whatever works the best and is most comfortable for you. Use music or television while you are exercising so that the exercises are a  pleasant break in your day. This will make your life better with the exercises acting as a break in routine you can look forward to.  Lying on your back, slowly slide your foot toward your buttocks, raising your knee up off the floor. Then slowly slide your foot back down until your leg is straight again.  Lying on your back spread your legs as far apart as you can without causing discomfort.  Lying on your side, raise your upper leg and foot straight up from the floor as far as is comfortable. Slowly lower the leg and repeat.  Lying on your back, tighten up the muscle in the front of your thigh (quadriceps muscles). You can do this by keeping your leg straight and trying to raise your heel off the floor. This helps strengthen the largest muscle supporting your knee.  Lying on your back, tighten up the muscles of your buttocks both with the legs straight and with the knee bent at a comfortable angle while keeping your heel on the floor.   SKILLED REHAB INSTRUCTIONS: If the patient is transferred to a skilled rehab facility following release from the hospital, a list of the current medications will be sent to the facility for the patient to continue.  When discharged from the skilled rehab facility, please have the facility set up the patient's Home Health Physical Therapy prior to being released. Also, the skilled facility will be responsible for providing the patient with their medications at time of release from the facility to include their pain medication and their blood thinner medication. If the patient is still at the rehab facility at time of the two week follow up appointment, the skilled rehab facility will also need to assist the patient in arranging follow up appointment in our office and any transportation needs.  POST-OPERATIVE OPIOID TAPER INSTRUCTIONS: It is important to wean off of your opioid medication as soon as possible. If you do not need pain medication after your surgery it is ok  to stop day one. Opioids include: Codeine, Hydrocodone(Norco, Vicodin), Oxycodone(Percocet, oxycontin) and hydromorphone amongst others.  Long term and even short term use of opiods can cause: Increased pain response Dependence Constipation Depression Respiratory depression And more.  Withdrawal symptoms can include Flu like symptoms Nausea, vomiting And more Techniques to manage these symptoms Hydrate well Eat regular healthy meals Stay active Use relaxation techniques(deep breathing, meditating, yoga) Do Not substitute Alcohol to help with tapering If you have been on opioids for less than two weeks and do not have pain than it is ok to stop all together.  Plan to wean off of opioids This plan should start within one week post op of your joint replacement. Maintain the same interval or time between taking each dose and first decrease the dose.  Cut the total daily intake of opioids by one tablet each day Next start to increase the time between doses. The last dose that should be eliminated is the evening dose.    MAKE   SURE YOU:  Understand these instructions.  Will watch your condition.  Will get help right away if you are not doing well or get worse.  Pick up stool softner and laxative for home use following surgery while on pain medications. Do not remove your dressing. The dressing is waterproof--it is OK to take showers. Continue to use ice for pain and swelling after surgery. Do not use any lotions or creams on the incision until instructed by your surgeon. Total Hip Protocol.   Information on my medicine - ELIQUIS (apixaban)   Why was Eliquis prescribed for you? Eliquis was prescribed for you to reduce the risk of blood clots forming after orthopedic surgery.    What do You need to know about Eliquis? Take your Eliquis TWICE DAILY - one tablet in the morning and one tablet in the evening with or without food.  It would be best to take the dose about the  same time each day.  If you have difficulty swallowing the tablet whole please discuss with your pharmacist how to take the medication safely.  Take Eliquis exactly as prescribed by your doctor and DO NOT stop taking Eliquis without talking to the doctor who prescribed the medication.  Stopping without other medication to take the place of Eliquis may increase your risk of developing a clot.  After discharge, you should have regular check-up appointments with your healthcare provider that is prescribing your Eliquis.  What do you do if you miss a dose? If a dose of ELIQUIS is not taken at the scheduled time, take it as soon as possible on the same day and twice-daily administration should be resumed.  The dose should not be doubled to make up for a missed dose.  Do not take more than one tablet of ELIQUIS at the same time.  Important Safety Information A possible side effect of Eliquis is bleeding. You should call your healthcare provider right away if you experience any of the following: Bleeding from an injury or your nose that does not stop. Unusual colored urine (red or dark brown) or unusual colored stools (red or black). Unusual bruising for unknown reasons. A serious fall or if you hit your head (even if there is no bleeding).  Some medicines may interact with Eliquis and might increase your risk of bleeding or clotting while on Eliquis. To help avoid this, consult your healthcare provider or pharmacist prior to using any new prescription or non-prescription medications, including herbals, vitamins, non-steroidal anti-inflammatory drugs (NSAIDs) and supplements.  This website has more information on Eliquis (apixaban): http://www.eliquis.com/eliquis/home   

## 2022-12-06 NOTE — Transfer of Care (Signed)
Immediate Anesthesia Transfer of Care Note  Patient: [name]  Procedure(s) Performed: TOTAL HIP ARTHROPLASTY ANTERIOR APPROACH (Right: Hip)  Patient Location: PACU  Anesthesia Type:MAC and Spinal  Level of Consciousness: awake and oriented  Airway & Oxygen Therapy: Patient Spontanous Breathing  Post-op Assessment: Report given to RN and Post -op Vital signs reviewed and stable  Post vital signs: Reviewed and stable  Last Vitals:  Vitals Value Taken Time  BP 111/57 12/06/22 1535  Temp    Pulse 63 12/06/22 1537  Resp 16 12/06/22 1537  SpO2 100 % 12/06/22 1537  Vitals shown include unvalidated device data.  Last Pain:  Vitals:   12/06/22 1100  TempSrc:   PainSc: 0-No pain      Patients Stated Pain Goal: 0 ([id number])  Complications: No notable events documented.

## 2022-12-06 NOTE — Progress Notes (Signed)
   12/06/22 1139  Vitals  Temp 98.5 F (36.9 C)  Pulse Rate 65  Resp 18  BP (!) 147/71  SpO2 100 %  O2 Device Room Air  Oxygen Therapy  Patient Activity (if Appropriate) In bed  Pulse Oximetry Type Continuous  Oximetry Probe Site Changed No  Post Treatment  Dialyzer Clearance Lightly streaked  Duration of HD Treatment -hour(s) 2.45 hour(s)  Hemodialysis Intake (mL) 0 mL  Liters Processed 50.9  Fluid Removed (mL) 1500 mL  Tolerated HD Treatment Yes  Post-Hemodialysis Comments pt transported directly to OR  AVG/AVF Arterial Site Held (minutes) 10 minutes  AVG/AVF Venous Site Held (minutes) 10 minutes   Received patient in bed to unit.  Alert and oriented.  Informed consent s2.45 minutes Pt take from HD directly over to preop-- Patient tolerated well.  Transported back to the room  Alert, without acute distress.  Hand-off given to patient's nurse.   Access used: RUAG Access issues: increased venous pressures secondary to position only  Total UF removed: 1500 Medication(s) given: none [facility]

## 2022-12-06 NOTE — Consult Note (Signed)
Reason for Consult:Right hip fx Referring Physician: Marylu Lund Time called: [id number] Time at bedside: [name] is an 82 y.o. male.  HPI: Travis [name] was stepping off a curb when he tripped and fell yesterday. He had immediate right hip pain and could not get up. He was brought to the ED where x-rays showed a right hip fx and orthopedic surgery was consulted. He lives at home with his wife and ambulates with the aid of a RW.  Past Medical History:  Diagnosis Date   Anemia    Arthritis    Chronic combined systolic and diastolic CHF (congestive heart failure) ([place])    a. Etiology of low EF not defined.   DVT (deep venous thrombosis) ([place])    a. 01/2015: RLE DVT. VQ scan intermediate probability. Underwent renal bx complicated by perinephric hematoma; anticoagulation stopped and IVC filter placed.   ESRD on hemodialysis ([place]) 02/2015   a. 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 Received plasma exchange and steroids but didn't respond and ended up starting hemodialysis June 2016.    Essential hypertension    GERD (gastroesophageal reflux disease)    History of nuclear stress test    Myoview 9/19 [facility]): low risk    HTN (hypertension)    Hypoalbuminemia    Hypoglycemia    NSTEMI (non-ST elevated myocardial infarction) ([place]) 04/18/2015   OA (osteoarthritis) of knee    Perinephric hematoma 01/2015   Proctitis 07/2015   Protein calorie malnutrition ([place])    Pulmonary fibrosis ([place])    Sjogren's disease ([place]) 01/28/2015    Past Surgical History:  Procedure Laterality Date   AV FISTULA PLACEMENT Right 02/17/2015   Procedure: INSERTION OF RIGHT ARM  ARTERIOVENOUS (AV) GORE-TEX GRAFT ;  Surgeon: Elam Dutch, MD;  Location: [facility];  Service: Vascular;  Laterality: Right;   FLEXIBLE SIGMOIDOSCOPY N/A 08/04/2015   Procedure: FLEXIBLE SIGMOIDOSCOPY;  Surgeon: Wilford Corner, MD;  Location: [facility];  Service: Endoscopy;   Laterality: N/A;   HEMORROIDECTOMY  1999   INSERTION OF DIALYSIS CATHETER N/A 02/17/2015   Procedure: INSERTION OF DIALYSIS CATHETER RIGHT INTERNAL JUGULAR VEIN;  Surgeon: Elam Dutch, MD;  Location: [place];  Service: Vascular;  Laterality: N/A;   MULTIPLE TOOTH EXTRACTIONS     PERIPHERAL VASCULAR BALLOON ANGIOPLASTY Right 01/23/2019   Procedure: PERIPHERAL VASCULAR BALLOON ANGIOPLASTY;  Surgeon: Elam Dutch, MD;  Location: [facility];  Service: Cardiovascular;  Laterality: Right;   REVISION OF ARTERIOVENOUS GORETEX GRAFT Right 03/29/2017   Procedure: REVISION OF ARTERIOVENOUS GORETEX GRAFT;  Surgeon: Conrad Iuka, MD;  Location: [facility];  Service: Vascular;  Laterality: Right;   RIGHT/LEFT HEART CATH AND CORONARY ANGIOGRAPHY N/A 07/06/2022   Procedure: RIGHT/LEFT HEART CATH AND CORONARY ANGIOGRAPHY;  Surgeon: Leonie Man, MD;  Location: [facility];  Service: Cardiovascular;  Laterality: N/A;   Surgical procedure to remove a mole as a child Right Eye area   At around 54 years old    Family History  Problem Relation Age of Onset   Hypertension Mother    Healthy Father    Hypertension Sister    Hypertension Brother     Social History:  reports that he has never smoked. He has never used smokeless tobacco. He reports that he does not drink alcohol and does not use drugs.  Allergies: No Known Allergies  Medications: I have reviewed the patient's current medications.  Results for  orders placed or performed during the hospital encounter of 12/05/22 (from the past 48 hour(s))  Basic metabolic panel     Status: Abnormal   Collection Time: 12/05/22  2:17 PM  Result Value Ref Range   Sodium 134 (L) 135 - 145 mmol/L   Potassium 5.9 (H) 3.5 - 5.1 mmol/L   Chloride 93 (L) 98 - 111 mmol/L   CO2 26 22 - 32 mmol/L   Glucose, Bld 71 70 - 99 mg/dL    Comment: Glucose reference range applies only to samples taken after fasting for at least 8 hours.   BUN 49 (H) 8 - 23  mg/dL   Creatinine, Ser 11.07 (H) 0.61 - 1.24 mg/dL   Calcium 8.8 (L) 8.9 - 10.3 mg/dL   GFR, Estimated 4 (L) >60 mL/min    Comment: (NOTE) Calculated using the CKD-EPI Creatinine Equation (2021)    Anion gap 15 5 - 15    Comment: Performed at [facility] [address], [place], [place] [address]  CBC with Differential/Platelet     Status: Abnormal   Collection Time: 12/05/22  2:17 PM  Result Value Ref Range   WBC 9.6 4.0 - 10.5 K/uL   RBC 4.03 (L) 4.22 - 5.81 MIL/uL   Hemoglobin 12.1 (L) 13.0 - 17.0 g/dL   HCT 38.4 (L) 39.0 - 52.0 %   MCV 95.3 80.0 - 100.0 fL   MCH 30.0 26.0 - 34.0 pg   MCHC 31.5 30.0 - 36.0 g/dL   RDW 17.9 (H) 11.5 - 15.5 %   Platelets 221 150 - 400 K/uL   nRBC 0.0 0.0 - 0.2 %   Neutrophils Relative % 71 %   Neutro Abs 6.9 1.7 - 7.7 K/uL   Lymphocytes Relative 12 %   Lymphs Abs 1.1 0.7 - 4.0 K/uL   Monocytes Relative 14 %   Monocytes Absolute 1.4 (H) 0.1 - 1.0 K/uL   Eosinophils Relative 2 %   Eosinophils Absolute 0.2 0.0 - 0.5 K/uL   Basophils Relative 1 %   Basophils Absolute 0.1 0.0 - 0.1 K/uL   Immature Granulocytes 0 %   Abs Immature Granulocytes 0.03 0.00 - 0.07 K/uL    Comment: Performed at [facility], [place] [address], [place], [place] [address]  Type and screen     Status: None   Collection Time: 12/05/22  6:02 PM  Result Value Ref Range   ABO/RH(D) A POS    Antibody Screen NEG    Sample Expiration      12/08/2022,2359 Performed at [facility], [place] [address], [place], [place] [address]   Brain natriuretic peptide     Status: Abnormal   Collection Time: 12/05/22 10:36 PM  Result Value Ref Range   B Natriuretic Peptide 365.7 (H) 0.0 - 100.0 pg/mL    Comment: Performed at [facility] [address], [place], [place] [address]  Potassium     Status: Abnormal   Collection Time: 12/05/22 10:36 PM  Result Value Ref Range   Potassium 6.1 (H) 3.5 - 5.1 mmol/L    Comment: Performed at [facility] [address], [place] [address]  CBC     Status: Abnormal   Collection Time: 12/06/22  3:33 AM  Result Value Ref Range   WBC 10.2 4.0 - 10.5 K/uL   RBC 4.06 (L) 4.22 - 5.81 MIL/uL   Hemoglobin 12.0 (L) 13.0 - 17.0 g/dL   HCT 38.8 (  L) 39.0 - 52.0 %   MCV 95.6 80.0 - 100.0 fL   MCH 29.6 26.0 - 34.0 pg   MCHC 30.9 30.0 - 36.0 g/dL   RDW 17.9 (H) 11.5 - 15.5 %   Platelets 211 150 - 400 K/uL   nRBC 0.0 0.0 - 0.2 %    Comment: Performed at [facility] [address], [place], [place] [id number]  Basic metabolic panel     Status: Abnormal   Collection Time: 12/06/22  3:33 AM  Result Value Ref Range   Sodium 135 135 - 145 mmol/L   Potassium 6.4 (HH) 3.5 - 5.1 mmol/L    Comment: CALL ATTEMPTED 0515 CALL ATTEMPTED 0535 CALL ATTEMPTED 0550 CRITICAL RESULT CALLED TO, READ BACK BY AND VERIFIED WITH E. NASSER,RN. [id number] 12/06/22. LPAIT    Chloride 90 (L) 98 - 111 mmol/L   CO2 29 22 - 32 mmol/L   Glucose, Bld 114 (H) 70 - 99 mg/dL    Comment: Glucose reference range applies only to samples taken after fasting for at least 8 hours.   BUN 64 (H) 8 - 23 mg/dL   Creatinine, Ser 11.98 (H) 0.61 - 1.24 mg/dL   Calcium 8.5 (L) 8.9 - 10.3 mg/dL   GFR, Estimated 4 (L) >60 mL/min    Comment: (NOTE) Calculated using the CKD-EPI Creatinine Equation (2021)    Anion gap 16 (H) 5 - 15    Comment: Performed at [facility] [address] [place], [place] [address]  Albumin     Status: None   Collection Time: 12/06/22  3:33 AM  Result Value Ref Range   Albumin 3.7 3.5 - 5.0 g/dL    Comment: Performed at [facility], [place] [address], [place], [place] [address]  Phosphorus     Status: Abnormal   Collection Time: 12/06/22  3:33 AM  Result Value Ref Range   Phosphorus 7.8 (H) 2.5 - 4.6 mg/dL    Comment: Performed at [facility] [address], [place],  [address]  MRSA Next Gen by PCR, Nasal     Status: None   Collection Time: 12/06/22  4:00 AM   Specimen: Nasal Mucosa;  Nasal Swab  Result Value Ref Range   MRSA by PCR Next Gen NOT DETECTED NOT DETECTED    Comment: (NOTE) The GeneXpert MRSA Assay (FDA approved for NASAL specimens only), is one component of a comprehensive MRSA colonization surveillance program. It is not intended to diagnose MRSA infection nor to guide or monitor treatment for MRSA infections. Test performance is not FDA approved in patients less than 81 years old. Performed at [facility], [place] [address], [place],  [address]     DG Knee Right Port  Result Date: 12/05/2022 CLINICAL DATA:  Right hip fracture.  Fall EXAM: PORTABLE RIGHT KNEE - 1-2 VIEW COMPARISON:  None Available. FINDINGS: No evidence of fracture, dislocation, or joint effusion. 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 Advanced atherosclerotic vascular calcifications. Soft tissues are otherwise unremarkable. 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 No acute fracture or dislocation. Electronically Signed   By: Davina Poke D.O.   On: 12/05/2022 17:17   DG Chest 1 View  Result Date: 12/05/2022 CLINICAL DATA:  Right hip fracture EXAM: CHEST  1 VIEW COMPARISON:  09/19/2022 FINDINGS: Upper normal heart size. Mediastinal contours and pulmonary vascularity normal. Vascular stent proximal RIGHT subclavian vessel. Atherosclerotic calcification aorta. Minimal RIGHT basilar atelectasis. Lungs  otherwise clear. No acute infiltrate, pleural effusion, or pneumothorax. Bones demineralized. IMPRESSION: Mild RIGHT basilar atelectasis. Aortic Atherosclerosis (ICD10-I70.0). Electronically Signed   By: Lavonia Dana M.D.   On: 12/05/2022 15:35   DG Hip Unilat W or Wo Pelvis 2-3 Views Right  Result Date: 12/05/2022 CLINICAL DATA:  Hip pain post fall EXAM: DG HIP (WITH OR WITHOUT PELVIS) 2-3V RIGHT COMPARISON:  08/25/2015 FINDINGS: Osseous demineralization. Hip joint spaces preserved. SI joints less well visualized.  Mildly displaced RIGHT femoral neck fracture without dislocation. No additional fracture or dislocation. Scattered atherosclerotic calcifications in pelvis. Soft tissue calcification RIGHT groin, nonspecific. IMPRESSION: Mildly displaced RIGHT femoral neck fracture. Electronically Signed   By: Lavonia Dana M.D.   On: 12/05/2022 15:34    Review of Systems  HENT:  Negative for ear discharge, ear pain, hearing loss and tinnitus.   Eyes:  Negative for photophobia and pain.  Respiratory:  Negative for cough and shortness of breath.   Cardiovascular:  Negative for chest pain.  Gastrointestinal:  Negative for abdominal pain, nausea and vomiting.  Genitourinary:  Negative for dysuria, flank pain, frequency and urgency.  Musculoskeletal:  Positive for arthralgias (Right hip pain). Negative for back pain, myalgias and neck pain.  Neurological:  Negative for dizziness and headaches.  Hematological:  Does not bruise/bleed easily.  Psychiatric/Behavioral:  The patient is not nervous/anxious.    Blood pressure 123/65, pulse 63, temperature 97.8 F (36.6 C), temperature source Oral, resp. rate 20, SpO2 100 %. Physical Exam Constitutional:      General: He is not in acute distress.    Appearance: He is well-developed. He is not diaphoretic.  HENT:     Head: Normocephalic and atraumatic.  Eyes:     General: No scleral icterus.       Right eye: No discharge.        Left eye: No discharge.     Conjunctiva/sclera: Conjunctivae normal.  Cardiovascular:     Rate and Rhythm: Normal rate and regular rhythm.  Pulmonary:     Effort: Pulmonary effort is normal. No respiratory distress.  Musculoskeletal:     Cervical back: Normal range of motion.     Comments: RLE No traumatic wounds, ecchymosis, or rash  Mild TTP hip  No knee or ankle effusion  Knee stable to varus/ valgus and anterior/posterior stress  Sens DPN, SPN, TN intact  Motor EHL, ext, flex, evers 5/5  DP 1+, PT 1+, No significant edema  Skin:     General: Skin is warm and dry.  Neurological:     Mental Status: He is alert.  Psychiatric:        Mood and Affect: Mood normal.        Behavior: Behavior normal.     Assessment/Plan: Right hip fx -- Plan THA today with Dr. Lyla Glassing. Please keep NPO. Multiple medical problems including ESRD on hemodialysis, hypertension, history of lower extremity DVT with IVC filter, CAD, and Sjogren disease -- per primary service    Lisette Abu, PA-C Orthopedic Surgery [phone] 12/06/2022, 10:05 AM

## 2022-12-06 NOTE — Anesthesia Preprocedure Evaluation (Addendum)
Anesthesia Evaluation  Patient identified by MRN, date of birth, ID band Patient awake    Reviewed: Allergy & Precautions, NPO status , Patient's Chart, lab work & pertinent test results, Unable to perform ROS - Chart review only  History of Anesthesia Complications Negative for: history of anesthetic complications  Airway Mallampati: II  TM Distance: >3 FB Neck ROM: Full    Dental no notable dental hx. (+) Dental Advisory Given   Pulmonary neg pulmonary ROS   Pulmonary exam normal        Cardiovascular hypertension, Pt. on medications and Pt. on home beta blockers pulmonary hypertension+ CAD, + Past MI and +CHF  Normal cardiovascular exam  Echo 06/2022  1. Left ventricular ejection fraction, by estimation, is 45 to 50%. Left ventricular ejection fraction by 2D MOD biplane is 47.4%. The left ventricle has mildly decreased function. The left ventricle has no regional wall motion abnormalities. There is moderate asymmetric left ventricular hypertrophy of the infero-lateral segment. Left ventricular diastolic parameters are consistent with Grade II diastolic dysfunction (pseudonormalization).   2. Right ventricular systolic function is normal. The right ventricular size is normal. Tricuspid regurgitation signal is inadequate for assessing PA pressure.   3. The mitral valve is grossly normal. Mild mitral valve regurgitation. No evidence of mitral stenosis.   4. The aortic valve is tricuspid. There is moderate calcification of the aortic valve. There is moderate thickening of the aortic valve. Aortic valve regurgitation is mild. Mild aortic valve stenosis.   5. The inferior vena cava is normal in size with greater than 50% respiratory variability, suggesting right atrial pressure of 3 mmHg.    LHC 06/2022   Heavily calcified diffusely diseased vessel: 1st Diag-1 lesion is 99% stenosed. 1st Diag-2 lesion is 60% stenosed. 1st Diag-3 lesion is  95% stenosed.   The left ventricular systolic function is normal. The left ventricular ejection fraction is 50-55% by visual estimate.   LV end diastolic pressure is moderately elevated.   Hemodynamic findings consistent with mild pulmonary hypertension.   POSTOP DIAGNOSES Moderate-severe 2-vessel single-vessel disease: Proximal D1-99%, mid 60%, distal 95% (heavily calcified/diffusely diseased; not favorable for PCI would require atherectomy and a relatively small caliber vessel) & 60% ostial sidebranch of OM1 followed by a 60% mid vessel then distal occlusion (with right to left collaterals). Otherwise mild diffuse disease/calcified vessels Mostly preserved LVEF, cannot exclude anterolateral hypokinesis.   LVEDP moderately elevated at 19 mmHg (LVEDP 166/5 mmHg) with a PCWP of ~22 mmHg. RHC numbers:  Mean RAP 6 mmHg, RVP-EDP 51/0-5 mmHg;  PAP-mean 50/13-27 mmHg; PCWP 17-26 mmHg (~22 mmHg);  Ao sat 99%, PA sat 74%.   CO-CI: Fick -> 8.22, 4.44; thermal 5.37-2.7 (suspect that the discrepancy is related to AV fistula)     RECOMMENDATIONS Would recommend medical management for heavily diseased D1 branch.   This is heavily calcified vessel with diffuse disease, will require atherectomy and extensive stent placement in the vessel is roughly 2 to 2.25 mm distally.  Not favorable for PCI. Consider slightly increased volume removal and HD based on elevated filling pressures. Return to nursing for ongoing care       Neuro/Psych  PSYCHIATRIC DISORDERS      negative neurological ROS     GI/Hepatic Neg liver ROS,GERD  ,,  Endo/Other  negative endocrine ROS    Renal/GU ESRF and DialysisRenal disease     Musculoskeletal  (+) Arthritis ,    Abdominal   Peds  Hematology  (+) Blood dyscrasia, anemia  Anesthesia Other Findings   Reproductive/Obstetrics                             Anesthesia Physical Anesthesia Plan  ASA: 4  Anesthesia  Plan: MAC and Spinal   Post-op Pain Management: Tylenol PO (pre-op)*   Induction: Intravenous  PONV Risk Score and Plan: 2 and Ondansetron and Propofol infusion  Airway Management Planned: Natural Airway  Additional Equipment:   Intra-op Plan:   Post-operative Plan:   Informed Consent: I have reviewed the patients History and Physical, chart, labs and discussed the procedure including the risks, benefits and alternatives for the proposed anesthesia with the patient or authorized representative who has indicated his/her understanding and acceptance.     Dental advisory given  Plan Discussed with: Anesthesiologist and CRNA  Anesthesia Plan Comments:        Anesthesia Quick Evaluation

## 2022-12-06 NOTE — Anesthesia Postprocedure Evaluation (Signed)
Anesthesia Post Note  Patient: Travis [name]  Procedure(s) Performed: TOTAL HIP ARTHROPLASTY ANTERIOR APPROACH (Right: Hip)     Patient location during evaluation: PACU Anesthesia Type: MAC and Spinal Level of consciousness: awake and alert Pain management: pain level controlled Vital Signs Assessment: post-procedure vital signs reviewed and stable Respiratory status: spontaneous breathing and respiratory function stable Cardiovascular status: blood pressure returned to baseline and stable Postop Assessment: spinal receding Anesthetic complications: no  No notable events documented.  Last Vitals:  Vitals:   12/06/22 1550 12/06/22 1600  BP: 123/62 131/63  Pulse: 61 (!) 58  Resp: 19 14  Temp:  36.8 C  SpO2: 100% 94%    Last Pain:  Vitals:   12/06/22 1535  TempSrc:   PainSc: 0-No pain        RLE Motor Response: Responds to commands (12/06/22 1600) RLE Sensation: Decreased (12/06/22 1600) L Sensory Level: S1-Sole of foot, small toes (12/06/22 1600) R Sensory Level: S1-Sole of foot, small toes (12/06/22 1600)  Gus Littler DANIEL

## 2022-12-06 NOTE — TOC CAGE-AID Note (Signed)
Transition of Care [facility]) - CAGE-AID Screening  Patient Details  Name: Travis [name] MRN: [id number] Date of Birth: 06-18-1941  Clinical Narrative:  Patient to ED after a mechanical fall. Patient denies any alcohol or drug use, no need for substance abuse resources at this time.  CAGE-AID Screening:   Have You Ever Felt You Ought to Cut Down on Your Drinking or Drug Use?: No Have People Annoyed You By Critizing Your Drinking Or Drug Use?: No Have You Felt Bad Or Guilty About Your Drinking Or Drug Use?: No Have You Ever Had a Drink or Used Drugs First Thing In The Morning to Steady Your Nerves or to Get Rid of a Hangover?: No CAGE-AID Score: 0  Substance Abuse Education Offered: No

## 2022-12-06 NOTE — Progress Notes (Signed)
Pt receives out-pt HD at [facility]) on MWF. Contacted clinic for chair time. Will assist as needed.   Melven Sartorius Renal Navigator [phone]

## 2022-12-06 NOTE — Progress Notes (Signed)
Initial Nutrition Assessment  DOCUMENTATION CODES:   Not applicable  INTERVENTION:  Once diet resumes, recommend: Nepro Shake po BID, each supplement provides 425 kcal and 19 grams protein Renal MVI with minerals daily  NUTRITION DIAGNOSIS:   Increased nutrient needs related to hip fracture as evidenced by estimated needs.  GOAL:   Patient will meet greater than or equal to 90% of their needs  MONITOR:   Labs, Weight trends, Diet advancement, Skin, I & O's  REASON FOR ASSESSMENT:   Consult Hip fracture protocol  ASSESSMENT:   Pt admitted from home after a fall leading to R hip fracture. PMH significant for ESRD on HD, HTN, LE DVT with IVC filter, CAD, Sjogren's disease.   Pt off unit for HD and then direct transport to the OR for THA.   Unable to obtain detailed nutrition related history at this time. No documented meal completions at this time as pt just recently admitted and has remained NPO.   EDW 70.5 kg Post HD UF 1.5L  Noted flowsheet documentation of inability to weight pre-dialysis. No post HD weight noted either. Last documented weight was 70.9 kg on 01/16. Will request updated weight as feasible following surgery.   Medications: calcitriol, sensipar  Labs: potassium 6.4 (lokelma given 3/24), BUN 64, Cr 11.98, anion gap 16, phos 7.8, GFR 4  NUTRITION - FOCUSED PHYSICAL EXAM: Pt off unit. Deferred to follow up.   Diet Order:   Diet Order             Diet NPO time specified Except for: Sips with Meds  Diet effective midnight           Diet NPO time specified Except for: Sips with Meds, Ice Chips  Diet effective midnight                   EDUCATION NEEDS:   No education needs have been identified at this time  Skin:  Skin Assessment: Reviewed RN Assessment  Last BM:  PTA  Height:   Ht Readings from Last 1 Encounters:  09/28/22 5\' 10"  (1.778 m)    Weight:   Wt Readings from Last 1 Encounters:  09/28/22 70.9 kg   BMI:  There is no  height or weight on file to calculate BMI.  Estimated Nutritional Needs:   Kcal:  1800-2000  Protein:  90-105g  Fluid:  1L + UOP  Clayborne Dana, RDN, LDN Clinical Nutrition

## 2022-12-06 NOTE — Anesthesia Procedure Notes (Signed)
Spinal  Patient location during procedure: OR Start time: 12/06/2022 1:32 PM End time: 12/06/2022 1:39 PM Reason for block: surgical anesthesia Staffing Performed: anesthesiologist  Anesthesiologist: Duane Boston, MD Performed by: Duane Boston, MD Authorized by: Duane Boston, MD   Preanesthetic Checklist Completed: patient identified, IV checked, risks and benefits discussed, surgical consent, monitors and equipment checked, pre-op evaluation and timeout performed Spinal Block Patient position: sitting Prep: DuraPrep Patient monitoring: cardiac monitor, continuous pulse ox and blood pressure Approach: right paramedian Location: L2-3 Injection technique: single-shot Needle Needle type: Pencan and Quincke  Needle gauge: 22 G Needle length: 9 cm Assessment Events: CSF return Additional Notes Functioning IV was confirmed and monitors were applied. Sterile prep and drape, including hand hygiene and sterile gloves were used. The patient was positioned and the spine was prepped. The skin was anesthetized with lidocaine.  Free flow of clear CSF was obtained prior to injecting local anesthetic into the CSF.  The spinal needle aspirated freely following injection.  The needle was carefully withdrawn.  The patient tolerated the procedure well.

## 2022-12-06 NOTE — Progress Notes (Signed)
[facility] Progress Note   Subjective:   Seen on HD. Feeling well, no concerns. Denies SOB, CP, dizziness and nausea. K+ 6.4 this AM.   Objective Vitals:   12/06/22 0333 12/06/22 0820 12/06/22 0830 12/06/22 0900  BP: (!) 150/69 (!) 144/64 (!) 145/62 134/65  Pulse: 63 62 60 80  Resp: 20 14 18 12   Temp:  97.8 F (36.6 C)    TempSrc:      SpO2: 100% 100% 100% 100%   Physical Exam General: Alert male in NAD Heart: RRR, no murmurs, rubs or gallops Lungs: CTA bilaterally Abdomen: Soft, non-distended, +BS Extremities: No edema b/l lower extremitise Dialysis Access: RUE AVG accessed  Additional Objective Labs: Basic Metabolic Panel: Recent Labs  Lab 12/05/22 1417 12/05/22 2236 12/06/22 0333  NA 134*  --  135  K 5.9* 6.1* 6.4*  CL 93*  --  90*  CO2 26  --  29  GLUCOSE 71  --  114*  BUN 49*  --  64*  CREATININE 11.07*  --  11.98*  CALCIUM 8.8*  --  8.5*  PHOS  --   --  7.8*   Liver Function Tests: Recent Labs  Lab 12/06/22 0333  ALBUMIN 3.7   No results for input(s): "LIPASE", "AMYLASE" in the last 168 hours. CBC: Recent Labs  Lab 12/05/22 1417 12/06/22 0333  WBC 9.6 10.2  NEUTROABS 6.9  --   HGB 12.1* 12.0*  HCT 38.4* 38.8*  MCV 95.3 95.6  PLT 221 211   Blood Culture    Component Value Date/Time   SDES BLOOD LEFT HAND 08/12/2022 1908   SPECREQUEST  08/12/2022 1908    BOTTLES DRAWN AEROBIC ONLY Blood Culture results may not be optimal due to an inadequate volume of blood received in culture bottles   CULT  08/12/2022 1908    NO GROWTH 5 DAYS Performed at [facility], [place] [address], [place], [place] [address]    REPTSTATUS 08/17/2022 FINAL 08/12/2022 1908    Cardiac Enzymes: No results for input(s): "CKTOTAL", "CKMB", "CKMBINDEX", "TROPONINI" in the last 168 hours. CBG: No results for input(s): "GLUCAP" in the last 168 hours. Iron Studies: No results for input(s): "IRON", "TIBC", "TRANSFERRIN", "FERRITIN" in the last 72  hours. @lablastinr3 @ Studies/Results: DG Knee Right Port  Result Date: 12/05/2022 CLINICAL DATA:  Right hip fracture.  Fall EXAM: PORTABLE RIGHT KNEE - 1-2 VIEW COMPARISON:  None Available. FINDINGS: No evidence of fracture, dislocation, or joint effusion. 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 Advanced atherosclerotic vascular calcifications. Soft tissues are otherwise unremarkable. 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 No acute fracture or dislocation. Electronically Signed   By: Davina Poke D.O.   On: 12/05/2022 17:17   DG Chest 1 View  Result Date: 12/05/2022 CLINICAL DATA:  Right hip fracture EXAM: CHEST  1 VIEW COMPARISON:  09/19/2022 FINDINGS: Upper normal heart size. Mediastinal contours and pulmonary vascularity normal. Vascular stent proximal RIGHT subclavian vessel. Atherosclerotic calcification aorta. Minimal RIGHT basilar atelectasis. Lungs otherwise clear. No acute infiltrate, pleural effusion, or pneumothorax. Bones demineralized. IMPRESSION: Mild RIGHT basilar atelectasis. Aortic Atherosclerosis (ICD10-I70.0). Electronically Signed   By: Lavonia Dana M.D.   On: 12/05/2022 15:35   DG Hip Unilat W or Wo Pelvis 2-3 Views Right  Result Date: 12/05/2022 CLINICAL DATA:  Hip pain post fall EXAM: DG HIP (WITH OR WITHOUT PELVIS) 2-3V RIGHT COMPARISON:  08/25/2015 FINDINGS: Osseous demineralization. Hip joint spaces preserved. SI joints less well visualized. Mildly displaced  RIGHT femoral neck fracture without dislocation. No additional fracture or dislocation. Scattered atherosclerotic calcifications in pelvis. Soft tissue calcification RIGHT groin, nonspecific. IMPRESSION: Mildly displaced RIGHT femoral neck fracture. Electronically Signed   By: Lavonia Dana M.D.   On: 12/05/2022 15:34   Medications:   ceFAZolin (ANCEF) IV     tranexamic acid      atorvastatin  40 mg Oral q1800   calcitRIOL  2.5 mcg Oral Q M,W,F-HD    carvedilol  12.5 mg Oral BID WC   Chlorhexidine Gluconate Cloth  6 each Topical Q0600   cinacalcet  150 mg Oral Q M,W,F-HD   feeding supplement  296 mL Oral Once   midodrine  10 mg Oral Q M,W,F   povidone-iodine  2 Application Topical Once    Dialysis Orders: MWF [facility]  3.5h  400/1.5   70.5kg  2/2 bath  AVG   Heparin none - last HD 3/22, post wt 70.3kg - rocaltrol 2.5 mcg po tiw - sensipar 150mg  po tiw - no esa, last Hb 11.5  Assessment/Plan: Fall/ R hip fracture - per pmd / ortho Hyperkalemia - K+ elevated, given lokelma yesterday. Should correct with HD. Continue renal diet.  ESRD - Continue MWF schedule.  HTN/ volume - takes norvasc at home. No vol excess on exam.  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 Cont renvela 2 ac tid as binder.  Chronic hypotension - Getting midodrine 10mg  pre hd mwf  Anice Paganini, PA-C 12/06/2022, 9:08 AM  [facility] Pager: [phone]

## 2022-12-06 NOTE — Interval H&P Note (Signed)
History and Physical Interval Note:  12/06/2022 1:21 PM  Travis [name]  has presented today for surgery, with the diagnosis of RIGHT FEMORAL NECK FRACTURE.  The various methods of treatment have been discussed with the patient and family. After consideration of risks, benefits and other options for treatment, the patient has consented to  Procedure(s): TOTAL HIP ARTHROPLASTY ANTERIOR APPROACH (Right) as a surgical intervention.  The patient's history has been reviewed, patient examined, no change in status, stable for surgery.  I have reviewed the patient's chart and labs.  Questions were answered to the patient's satisfaction.    The risks, benefits, and alternatives were discussed with the patient. There are risks associated with the surgery including, but not limited to, problems with anesthesia (death), infection, instability (giving out of the joint), dislocation, differences in leg length/angulation/rotation, fracture of bones, loosening or failure of implants, hematoma (blood accumulation) which may require surgical drainage, blood clots, pulmonary embolism, nerve injury (foot drop and lateral thigh numbness), and blood vessel injury. The patient understands these risks and elects to proceed.    Hilton Cork Zanaiya Calabria

## 2022-12-06 NOTE — Anesthesia Postprocedure Evaluation (Signed)
Anesthesia Post Note  Patient: Travis [name]  Procedure(s) Performed: AN AD Pana     Patient location during evaluation: PACU Anesthesia Type: Regional Level of consciousness: awake and alert Pain management: pain level controlled Vital Signs Assessment: post-procedure vital signs reviewed and stable Respiratory status: spontaneous breathing Cardiovascular status: stable Anesthetic complications: no   No notable events documented.  Last Vitals:  Vitals:   12/05/22 2200 12/06/22 0333  BP: (!) 161/66 (!) 150/69  Pulse: 62 63  Resp: 14 20  Temp: 36.6 C   SpO2: 95% 100%    Last Pain:  Vitals:   12/05/22 2335  TempSrc:   PainSc: [place]

## 2022-12-06 NOTE — Progress Notes (Addendum)
  Progress Note   Patient: Travis [name] [id number] DOB: December 15, 1940 DOA: 12/05/2022     1 DOS: the patient was seen and examined on 12/06/2022   Brief hospital course: 82 y.o. male with medical history significant for ESRD on hemodialysis, hypertension, history of lower extremity DVT with IVC filter, CAD, and Sjogren disease who presents to the emergency department with right hip pain after mechanical ground-level fall.   Plain films of the hip and pelvis reveal mildly displaced right femoral neck fracture   Assessment and Plan: 1. Right hip fracture  - Orthopedic surgery consulted  - Pt now s/p R total hip arthoplasty 3/25   2. ESRD; hyperkalemia -seen on HD - Potassium is 5.9 in ED, despite dose of lokelma, repeat K of 6.4 -Pt has since undergone HD this AM -K post-HD noted to be 6.1. Will give another dose of lokelma - cont to follow bmet trends   3. CAD  - denied chest pains or sob   4. Chronic HFmrEF  - EF 45-50% on TTE from October 2023   - Appears compensated, volume will be managed with dialysis,  -continue Coreg per home regimen     Subjective: Seen on HD this AM. Denies sob or chest pains  Physical Exam: Vitals:   12/06/22 1535 12/06/22 1550 12/06/22 1600 12/06/22 1617  BP: (!) 111/57 123/62 131/63 (!) 149/64  Pulse: 64 61 (!) 58 61  Resp: 19 19 14 20   Temp: 98.3 F (36.8 C)  98.3 F (36.8 C) 98 F (36.7 C)  TempSrc:    Oral  SpO2: 100% 100% 94% 96%   General exam: Awake, laying in bed, in nad Respiratory system: Normal respiratory effort, no wheezing Cardiovascular system: regular rate, s1, s2 Gastrointestinal system: Soft, nondistended, positive BS Central nervous system: CN2-12 grossly intact, strength intact Extremities: Perfused, no clubbing Skin: Normal skin turgor, no notable skin lesions seen Psychiatry: Mood normal // no visual hallucinations   Data Reviewed:  .Labs reviewed: Na 135, K 6.1, Cr 9.80  Family Communication: Pt in room,  family not at bedside  Disposition: Status is: Inpatient Remains inpatient appropriate because: Severity of illness  Planned Discharge Destination:  Unclear at this time, pending PT    Author: Marylu Lund, MD 12/06/2022 4:27 PM  For on call review [email or web address].

## 2022-12-06 NOTE — Procedures (Signed)
I was present at this dialysis session. I have reviewed the session itself and made appropriate changes.   Filed Weights    Recent Labs  Lab 12/06/22 0333  NA 135  K 6.4*  CL 90*  CO2 29  GLUCOSE 114*  BUN 64*  CREATININE 11.98*  CALCIUM 8.5*  PHOS 7.8*    Recent Labs  Lab 12/05/22 1417 12/06/22 0333  WBC 9.6 10.2  NEUTROABS 6.9  --   HGB 12.1* 12.0*  HCT 38.4* 38.8*  MCV 95.3 95.6  PLT 221 211    Scheduled Meds:  atorvastatin  40 mg Oral q1800   calcitRIOL  2.5 mcg Oral Q M,W,F-HD   carvedilol  12.5 mg Oral BID WC   Chlorhexidine Gluconate Cloth  6 each Topical Q0600   cinacalcet  150 mg Oral Q M,W,F-HD   feeding supplement  296 mL Oral Once   midodrine  10 mg Oral Q M,W,F   povidone-iodine  2 Application Topical Once   Continuous Infusions:   ceFAZolin (ANCEF) IV     tranexamic acid     PRN Meds:.HYDROmorphone (DILAUDID) injection, methocarbamol, oxyCODONE, senna   Santiago Bumpers,  MD 12/06/2022, 9:31 AM

## 2022-12-06 NOTE — Hospital Course (Signed)
Travis [name] is a 82 y.o. male with a history of ESRD on HD, hypertension, LE DVT with IVC filter, CAD, Sjogren's disease. Patient presented after a fall and subsequent right hip pain and found to have a right femoral neck fracture. Orthopedic surgery consulted and performed a total hip arthroplasty. Weight bearing as tolerated. Patient discharged home with home health.

## 2022-12-06 NOTE — Op Note (Signed)
OPERATIVE REPORT  SURGEON: Rod Can, MD   ASSISTANT: Larene Pickett, PA-C.  PREOPERATIVE DIAGNOSIS: Displaced Right femoral neck fracture.   POSTOPERATIVE DIAGNOSIS: Displaced Right femoral neck fracture.   PROCEDURE: Right total hip arthroplasty, anterior approach.   IMPLANTS: Biomet Taperloc Reduced Distal stem, size 17x14mm, high offset. Biomet G7 OsseoTi Cup, size 56 mm. Biomet Vivacit-E liner, size 36 mm, F, neutral. Biomet metal head ball, size 36 + 0 mm.  ANESTHESIA:  MAC and Spinal  ANTIBIOTICS: 2g ancef.  ESTIMATED BLOOD LOSS: 150 mL.    DRAINS: None.  COMPLICATIONS: None   CONDITION: PACU - hemodynamically stable.   BRIEF CLINICAL NOTE: Travis [name] is a 82 y.o. male with a displaced Right femoral neck fracture. The patient was admitted to the hospitalist service and underwent perioperative risk stratification and medical optimization. The risks, benefits, and alternatives to total hip arthroplasty were explained, and the patient elected to proceed.  PROCEDURE IN DETAIL: The patient was taken to the operating room and general anesthesia was induced on the hospital bed.  The patient was then positioned on the Hana table.  All bony prominences were well padded.  The hip was prepped and draped in the normal sterile surgical fashion.  A time-out was called verifying side and site of surgery. Antibiotics were given within 60 minutes of beginning the procedure.   Bikini incision was made, and the direct anterior approach to the hip was performed through the Hueter interval.  Lateral femoral circumflex vessels were treated with the Auqumantys. The anterior capsule was exposed and an inverted T capsulotomy was made.  Fracture hematoma was encountered and evacuated. The patient was found to have a comminuted Right subcapital femoral neck fracture.  I freshened the femoral neck cut with a saw.  I removed the femoral neck fragment.  A corkscrew was placed into the head and the  head was removed.  This was passed to the back table and was measured. The pubofemoral ligament was released subperiosteally to the lesser trochanter.  Acetabular exposure was achieved, and the pulvinar and labrum were excised. Sequential reaming of the acetabulum was then performed up to a size 55 mm reamer under direct visulization. A 56 mm cup was then opened and impacted into place at approximately 40 degrees of abduction and 20 degrees of anteversion. The final polyethylene liner was impacted into place and acetabular osteophytes were removed.    I then gained femoral exposure taking care to protect the abductors and greater trochanter.  This was performed using standard external rotation, extension, and adduction.  A cookie cutter was used to enter the femoral canal, and then the femoral canal finder was placed.  Sequential broaching was performed up to a size 17.  Calcar planer was used on the femoral neck remnant.  I placed a high offset neck and a trial head ball.  The hip was reduced.  Leg lengths and offset were checked fluoroscopically.  The hip was dislocated and trial components were removed.  The final implants were placed, and the hip was reduced.  Fluoroscopy was used to confirm component position and leg lengths.  At 90 degrees of external rotation and full extension, the hip was stable to an anterior directed force.   The wound was copiously irrigated with Irrisept solution and normal saline using pulse lavage.  Marcaine solution was injected into the periarticular soft tissue.  The wound was closed in layers using #1 V-Loc for the fascia, 2-0 Vicryl for the subcutaneous fat, 2-0 Monocryl for  the deep dermal layer, and staples + Dermabond for the skin.  Once the glue was fully dried, an Aquacell Ag dressing was applied.  The patient was transported to the recovery room in stable condition.  Sponge, needle, and instrument counts were correct at the end of the case x2.  The patient tolerated  the procedure well and there were no known complications.  Please note that a surgical assistant was a medical necessity for this procedure to perform it in a safe and expeditious manner. Assistant was necessary to provide appropriate retraction of vital neurovascular structures, to prevent femoral fracture, and to allow for anatomic placement of the prosthesis.

## 2022-12-06 NOTE — H&P (View-Only) (Signed)
Reason for Consult:Right hip fx Referring Physician: Marylu Lund Time called: [id number] Time at bedside: [name] is an 82 y.o. male.  HPI: Travis [name] was stepping off a curb when he tripped and fell yesterday. He had immediate right hip pain and could not get up. He was brought to the ED where x-rays showed a right hip fx and orthopedic surgery was consulted. He lives at home with his wife and ambulates with the aid of a RW.  Past Medical History:  Diagnosis Date   Anemia    Arthritis    Chronic combined systolic and diastolic CHF (congestive heart failure) ([place])    a. Etiology of low EF not defined.   DVT (deep venous thrombosis) ([place])    a. 01/2015: RLE DVT. VQ scan intermediate probability. Underwent renal bx complicated by perinephric hematoma; anticoagulation stopped and IVC filter placed.   ESRD on hemodialysis ([place]) 02/2015   a. 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 Received plasma exchange and steroids but didn't respond and ended up starting hemodialysis June 2016.    Essential hypertension    GERD (gastroesophageal reflux disease)    History of nuclear stress test    Myoview 9/19 [facility]): low risk    HTN (hypertension)    Hypoalbuminemia    Hypoglycemia    NSTEMI (non-ST elevated myocardial infarction) ([place]) 04/18/2015   OA (osteoarthritis) of knee    Perinephric hematoma 01/2015   Proctitis 07/2015   Protein calorie malnutrition ([place])    Pulmonary fibrosis ([place])    Sjogren's disease ([place]) 01/28/2015    Past Surgical History:  Procedure Laterality Date   AV FISTULA PLACEMENT Right 02/17/2015   Procedure: INSERTION OF RIGHT ARM  ARTERIOVENOUS (AV) GORE-TEX GRAFT ;  Surgeon: Elam Dutch, MD;  Location: [place];  Service: Vascular;  Laterality: Right;   FLEXIBLE SIGMOIDOSCOPY N/A 08/04/2015   Procedure: FLEXIBLE SIGMOIDOSCOPY;  Surgeon: Wilford Corner, MD;  Location: [facility];  Service: Endoscopy;   Laterality: N/A;   HEMORROIDECTOMY  1999   INSERTION OF DIALYSIS CATHETER N/A 02/17/2015   Procedure: INSERTION OF DIALYSIS CATHETER RIGHT INTERNAL JUGULAR VEIN;  Surgeon: Elam Dutch, MD;  Location: [place];  Service: Vascular;  Laterality: N/A;   MULTIPLE TOOTH EXTRACTIONS     PERIPHERAL VASCULAR BALLOON ANGIOPLASTY Right 01/23/2019   Procedure: PERIPHERAL VASCULAR BALLOON ANGIOPLASTY;  Surgeon: Elam Dutch, MD;  Location: [place] [facility];  Service: Cardiovascular;  Laterality: Right;   REVISION OF ARTERIOVENOUS GORETEX GRAFT Right 03/29/2017   Procedure: REVISION OF ARTERIOVENOUS GORETEX GRAFT;  Surgeon: Conrad Keachi, MD;  Location: [place];  Service: Vascular;  Laterality: Right;   RIGHT/LEFT HEART CATH AND CORONARY ANGIOGRAPHY N/A 07/06/2022   Procedure: RIGHT/LEFT HEART CATH AND CORONARY ANGIOGRAPHY;  Surgeon: Leonie Man, MD;  Location: [place] [facility];  Service: Cardiovascular;  Laterality: N/A;   Surgical procedure to remove a mole as a child Right Eye area   At around 31 years old    Family History  Problem Relation Age of Onset   Hypertension Mother    Healthy Father    Hypertension Sister    Hypertension Brother     Social History:  reports that he has never smoked. He has never used smokeless tobacco. He reports that he does not drink alcohol and does not use drugs.  Allergies: No Known Allergies  Medications: I have reviewed the patient's current medications.  Results for  orders placed or performed during the hospital encounter of 12/05/22 (from the past 48 hour(s))  Basic metabolic panel     Status: Abnormal   Collection Time: 12/05/22  2:17 PM  Result Value Ref Range   Sodium 134 (L) 135 - 145 mmol/L   Potassium 5.9 (H) 3.5 - 5.1 mmol/L   Chloride 93 (L) 98 - 111 mmol/L   CO2 26 22 - 32 mmol/L   Glucose, Bld 71 70 - 99 mg/dL    Comment: Glucose reference range applies only to samples taken after fasting for at least 8 hours.   BUN 49 (H) 8 - 23  mg/dL   Creatinine, Ser 11.07 (H) 0.61 - 1.24 mg/dL   Calcium 8.8 (L) 8.9 - 10.3 mg/dL   GFR, Estimated 4 (L) >60 mL/min    Comment: (NOTE) Calculated using the CKD-EPI Creatinine Equation (2021)    Anion gap 15 5 - 15    Comment: Performed at [facility] [address] [place], [place] [address]  CBC with Differential/Platelet     Status: Abnormal   Collection Time: 12/05/22  2:17 PM  Result Value Ref Range   WBC 9.6 4.0 - 10.5 K/uL   RBC 4.03 (L) 4.22 - 5.81 MIL/uL   Hemoglobin 12.1 (L) 13.0 - 17.0 g/dL   HCT 38.4 (L) 39.0 - 52.0 %   MCV 95.3 80.0 - 100.0 fL   MCH 30.0 26.0 - 34.0 pg   MCHC 31.5 30.0 - 36.0 g/dL   RDW 17.9 (H) 11.5 - 15.5 %   Platelets 221 150 - 400 K/uL   nRBC 0.0 0.0 - 0.2 %   Neutrophils Relative % 71 %   Neutro Abs 6.9 1.7 - 7.7 K/uL   Lymphocytes Relative 12 %   Lymphs Abs 1.1 0.7 - 4.0 K/uL   Monocytes Relative 14 %   Monocytes Absolute 1.4 (H) 0.1 - 1.0 K/uL   Eosinophils Relative 2 %   Eosinophils Absolute 0.2 0.0 - 0.5 K/uL   Basophils Relative 1 %   Basophils Absolute 0.1 0.0 - 0.1 K/uL   Immature Granulocytes 0 %   Abs Immature Granulocytes 0.03 0.00 - 0.07 K/uL    Comment: Performed at [facility], [place] [address], [place], [place] [address]  Type and screen     Status: None   Collection Time: 12/05/22  6:02 PM  Result Value Ref Range   ABO/RH(D) A POS    Antibody Screen NEG    Sample Expiration      12/08/2022,2359 Performed at [facility], [place] [address], [place],  [address]   Brain natriuretic peptide     Status: Abnormal   Collection Time: 12/05/22 10:36 PM  Result Value Ref Range   B Natriuretic Peptide 365.7 (H) 0.0 - 100.0 pg/mL    Comment: Performed at [facility] [address], [place],  [address]  Potassium     Status: Abnormal   Collection Time: 12/05/22 10:36 PM  Result Value Ref Range   Potassium 6.1 (H) 3.5 - 5.1 mmol/L    Comment: Performed at [facility] [address], [place] [address]  CBC     Status: Abnormal   Collection Time: 12/06/22  3:33 AM  Result Value Ref Range   WBC 10.2 4.0 - 10.5 K/uL   RBC 4.06 (L) 4.22 - 5.81 MIL/uL   Hemoglobin 12.0 (L) 13.0 - 17.0 g/dL   HCT 38.8 (  L) 39.0 - 52.0 %   MCV 95.6 80.0 - 100.0 fL   MCH 29.6 26.0 - 34.0 pg   MCHC 30.9 30.0 - 36.0 g/dL   RDW 17.9 (H) 11.5 - 15.5 %   Platelets 211 150 - 400 K/uL   nRBC 0.0 0.0 - 0.2 %    Comment: Performed at [facility] [address], [place], [place] [id number]  Basic metabolic panel     Status: Abnormal   Collection Time: 12/06/22  3:33 AM  Result Value Ref Range   Sodium 135 135 - 145 mmol/L   Potassium 6.4 (HH) 3.5 - 5.1 mmol/L    Comment: CALL ATTEMPTED 0515 CALL ATTEMPTED 0535 CALL ATTEMPTED 0550 CRITICAL RESULT CALLED TO, READ BACK BY AND VERIFIED WITH E. NASSER,RN. [id number] 12/06/22. LPAIT    Chloride 90 (L) 98 - 111 mmol/L   CO2 29 22 - 32 mmol/L   Glucose, Bld 114 (H) 70 - 99 mg/dL    Comment: Glucose reference range applies only to samples taken after fasting for at least 8 hours.   BUN 64 (H) 8 - 23 mg/dL   Creatinine, Ser 11.98 (H) 0.61 - 1.24 mg/dL   Calcium 8.5 (L) 8.9 - 10.3 mg/dL   GFR, Estimated 4 (L) >60 mL/min    Comment: (NOTE) Calculated using the CKD-EPI Creatinine Equation (2021)    Anion gap 16 (H) 5 - 15    Comment: Performed at [facility] [address], [place], [place] [address]  Albumin     Status: None   Collection Time: 12/06/22  3:33 AM  Result Value Ref Range   Albumin 3.7 3.5 - 5.0 g/dL    Comment: Performed at [facility], [place] [address], [place], [place] [address]  Phosphorus     Status: Abnormal   Collection Time: 12/06/22  3:33 AM  Result Value Ref Range   Phosphorus 7.8 (H) 2.5 - 4.6 mg/dL    Comment: Performed at [facility] [address] [place], [place] [address]  MRSA Next Gen by PCR, Nasal     Status: None   Collection Time: 12/06/22  4:00 AM   Specimen: Nasal Mucosa;  Nasal Swab  Result Value Ref Range   MRSA by PCR Next Gen NOT DETECTED NOT DETECTED    Comment: (NOTE) The GeneXpert MRSA Assay (FDA approved for NASAL specimens only), is one component of a comprehensive MRSA colonization surveillance program. It is not intended to diagnose MRSA infection nor to guide or monitor treatment for MRSA infections. Test performance is not FDA approved in patients less than 19 years old. Performed at [facility], [place] [address] [place], [place] [address]     DG Knee Right Port  Result Date: 12/05/2022 CLINICAL DATA:  Right hip fracture.  Fall EXAM: PORTABLE RIGHT KNEE - 1-2 VIEW COMPARISON:  None Available. FINDINGS: No evidence of fracture, dislocation, or joint effusion. 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 Advanced atherosclerotic vascular calcifications. Soft tissues are otherwise unremarkable. 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 No acute fracture or dislocation. Electronically Signed   By: Davina Poke D.O.   On: 12/05/2022 17:17   DG Chest 1 View  Result Date: 12/05/2022 CLINICAL DATA:  Right hip fracture EXAM: CHEST  1 VIEW COMPARISON:  09/19/2022 FINDINGS: Upper normal heart size. Mediastinal contours and pulmonary vascularity normal. Vascular stent proximal RIGHT subclavian vessel. Atherosclerotic calcification aorta. Minimal RIGHT basilar atelectasis. Lungs  otherwise clear. No acute infiltrate, pleural effusion, or pneumothorax. Bones demineralized. IMPRESSION: Mild RIGHT basilar atelectasis. Aortic Atherosclerosis (ICD10-I70.0). Electronically Signed   By: Lavonia Dana M.D.   On: 12/05/2022 15:35   DG Hip Unilat W or Wo Pelvis 2-3 Views Right  Result Date: 12/05/2022 CLINICAL DATA:  Hip pain post fall EXAM: DG HIP (WITH OR WITHOUT PELVIS) 2-3V RIGHT COMPARISON:  08/25/2015 FINDINGS: Osseous demineralization. Hip joint spaces preserved. SI joints less well visualized.  Mildly displaced RIGHT femoral neck fracture without dislocation. No additional fracture or dislocation. Scattered atherosclerotic calcifications in pelvis. Soft tissue calcification RIGHT groin, nonspecific. IMPRESSION: Mildly displaced RIGHT femoral neck fracture. Electronically Signed   By: Lavonia Dana M.D.   On: 12/05/2022 15:34    Review of Systems  HENT:  Negative for ear discharge, ear pain, hearing loss and tinnitus.   Eyes:  Negative for photophobia and pain.  Respiratory:  Negative for cough and shortness of breath.   Cardiovascular:  Negative for chest pain.  Gastrointestinal:  Negative for abdominal pain, nausea and vomiting.  Genitourinary:  Negative for dysuria, flank pain, frequency and urgency.  Musculoskeletal:  Positive for arthralgias (Right hip pain). Negative for back pain, myalgias and neck pain.  Neurological:  Negative for dizziness and headaches.  Hematological:  Does not bruise/bleed easily.  Psychiatric/Behavioral:  The patient is not nervous/anxious.    Blood pressure 123/65, pulse 63, temperature 97.8 F (36.6 C), temperature source Oral, resp. rate 20, SpO2 100 %. Physical Exam Constitutional:      General: He is not in acute distress.    Appearance: He is well-developed. He is not diaphoretic.  HENT:     Head: Normocephalic and atraumatic.  Eyes:     General: No scleral icterus.       Right eye: No discharge.        Left eye: No discharge.     Conjunctiva/sclera: Conjunctivae normal.  Cardiovascular:     Rate and Rhythm: Normal rate and regular rhythm.  Pulmonary:     Effort: Pulmonary effort is normal. No respiratory distress.  Musculoskeletal:     Cervical back: Normal range of motion.     Comments: RLE No traumatic wounds, ecchymosis, or rash  Mild TTP hip  No knee or ankle effusion  Knee stable to varus/ valgus and anterior/posterior stress  Sens DPN, SPN, TN intact  Motor EHL, ext, flex, evers 5/5  DP 1+, PT 1+, No significant edema  Skin:     General: Skin is warm and dry.  Neurological:     Mental Status: He is alert.  Psychiatric:        Mood and Affect: Mood normal.        Behavior: Behavior normal.     Assessment/Plan: Right hip fx -- Plan THA today with Dr. Lyla Glassing. Please keep NPO. Multiple medical problems including ESRD on hemodialysis, hypertension, history of lower extremity DVT with IVC filter, CAD, and Sjogren disease -- per primary service    Lisette Abu, PA-C Orthopedic Surgery [phone] 12/06/2022, 10:05 AM

## 2022-12-07 ENCOUNTER — Inpatient Hospital Stay (HOSPITAL_COMMUNITY): Payer: No Typology Code available for payment source

## 2022-12-07 ENCOUNTER — Encounter (HOSPITAL_COMMUNITY): Payer: Self-pay | Admitting: Orthopedic Surgery

## 2022-12-07 DIAGNOSIS — S72001A Fracture of unspecified part of neck of right femur, initial encounter for closed fracture: Secondary | ICD-10-CM | POA: Diagnosis not present

## 2022-12-07 LAB — CBC
HCT: 29.5 % — ABNORMAL LOW (ref 39.0–52.0)
Hemoglobin: 9.6 g/dL — ABNORMAL LOW (ref 13.0–17.0)
MCH: 30.5 pg (ref 26.0–34.0)
MCHC: 32.5 g/dL (ref 30.0–36.0)
MCV: 93.7 fL (ref 80.0–100.0)
Platelets: 169 K/uL (ref 150–400)
RBC: 3.15 MIL/uL — ABNORMAL LOW (ref 4.22–5.81)
RDW: 17.7 % — ABNORMAL HIGH (ref 11.5–15.5)
WBC: 10.6 K/uL — ABNORMAL HIGH (ref 4.0–10.5)
nRBC: 0 % (ref 0.0–0.2)

## 2022-12-07 LAB — BASIC METABOLIC PANEL
Anion gap: 16 — ABNORMAL HIGH (ref 5–15)
BUN: 56 mg/dL — ABNORMAL HIGH (ref 8–23)
CO2: 26 mmol/L (ref 22–32)
Calcium: 8.4 mg/dL — ABNORMAL LOW (ref 8.9–10.3)
Chloride: 89 mmol/L — ABNORMAL LOW (ref 98–111)
Creatinine, Ser: 9.54 mg/dL — ABNORMAL HIGH (ref 0.61–1.24)
GFR, Estimated: 5 mL/min — ABNORMAL LOW (ref >60)
Glucose, Bld: 109 mg/dL — ABNORMAL HIGH (ref 70–99)
Potassium: 4.6 mmol/L (ref 3.5–5.1)
Sodium: 131 mmol/L — ABNORMAL LOW (ref 135–145)

## 2022-12-07 LAB — HEPATITIS B SURFACE ANTIBODY, QUANTITATIVE: Hep B S AB Quant (Post): >1000 m[IU]/mL (ref >9.9)

## 2022-12-07 MED ORDER — HYDROCODONE-ACETAMINOPHEN 5-325 MG PO TABS: 1.0000 | ORAL_TABLET | ORAL | 0 refills | Status: DC | PRN

## 2022-12-07 MED ORDER — PENTAFLUOROPROP-TETRAFLUOROETH EX AERO: 1.0000 | INHALATION_SPRAY | CUTANEOUS | Status: DC | PRN

## 2022-12-07 MED ORDER — DARBEPOETIN ALFA 40 MCG/0.4ML IJ SOSY
40.0000 ug | PREFILLED_SYRINGE | INTRAMUSCULAR | Status: DC
  Administered 2022-12-08: 40 ug via SUBCUTANEOUS
  Filled 2022-12-07: qty 0.4

## 2022-12-07 MED ORDER — APIXABAN 2.5 MG PO TABS
2.5000 mg | ORAL_TABLET | Freq: Two times a day (BID) | ORAL | Status: DC
  Administered 2022-12-07 – 2022-12-08 (×3): 2.5 mg via ORAL
  Filled 2022-12-07 (×3): qty 1

## 2022-12-07 MED ORDER — CHLORHEXIDINE GLUCONATE CLOTH 2 % EX PADS
6.0000 | MEDICATED_PAD | Freq: Every day | CUTANEOUS | Status: DC
  Administered 2022-12-07 – 2022-12-08 (×2): 6 via TOPICAL

## 2022-12-07 MED ORDER — ANTICOAGULANT SODIUM CITRATE 4% (200MG/5ML) IV SOLN
5.0000 mL | Status: DC | PRN
  Filled 2022-12-07: qty 5

## 2022-12-07 MED ORDER — HEPARIN SODIUM (PORCINE) 1000 UNIT/ML DIALYSIS: 1000.0000 [IU] | INTRAMUSCULAR | Status: DC | PRN

## 2022-12-07 MED ORDER — ALTEPLASE 2 MG IJ SOLR: 2.0000 mg | Freq: Once | INTRAMUSCULAR | Status: DC | PRN

## 2022-12-07 MED ORDER — SEVELAMER CARBONATE 800 MG PO TABS
1600.0000 mg | ORAL_TABLET | Freq: Three times a day (TID) | ORAL | Status: DC
  Administered 2022-12-07 – 2022-12-08 (×2): 1600 mg via ORAL
  Filled 2022-12-07 (×3): qty 2

## 2022-12-07 MED ORDER — LIDOCAINE-PRILOCAINE 2.5-2.5 % EX CREA: 1.0000 | TOPICAL_CREAM | CUTANEOUS | Status: DC | PRN

## 2022-12-07 MED ORDER — LIDOCAINE HCL (PF) 1 % IJ SOLN: 5.0000 mL | INTRAMUSCULAR | Status: DC | PRN

## 2022-12-07 NOTE — TOC Initial Note (Addendum)
Transition of Care [facility]) - Initial/Assessment Note    Patient Details  Name: Travis [name] MRN: [id number] Date of Birth: 1940-11-26  Transition of Care [facility]) CM/SW Contact:    Carles Collet, RN Phone Number: 12/07/2022, 11:09 AM  Clinical Narrative:                  Damaris Schooner w patient at bedside.  He states that he has 3 RW at home, both 2 and 4 wheeled. He has a BSC, and he has a shower chair, all supplied through the [place]. I do not foresee any additional DME needs.   We discussed [place] services, noting that he was active with both [facility] and [facility].  He had no preference. Referral made to  [facility] for [facility] services. I will contact the [facility] for auth once patient has been screened by physical therapy here.   Patient states that he has aids in the home Tuesday and Thursday.   Per records- "Patient is a member of the [facility]; PCP is Dr. Netta Neat and [place] is Hale Bogus [phone] ext [phone]. "  11:58 LVM with CSW [place] at the [facility] requesting auth for [facility] services.   14:30 [facility] w [facility] and [facility] [place] referral form with supportive documentation back for [facility].   Expected Discharge Plan: [place] Barriers to Discharge: Continued Medical Work up   Patient Goals and CMS Choice Patient states their goals for this hospitalization and ongoing recovery are:: to return home CMS Medicare.gov Compare Post Acute Care list provided to:: Patient Choice offered to / list presented to : Patient      Expected Discharge Plan and Services   Discharge Planning Services: CM Consult Post Acute Care Choice: [place] arrangements for the past 2 months: Single Family Home                           HH Arranged: PT, OT HH Agency: [facility] Date [facility] Agency Contacted: 12/07/22 Time HH Agency Contacted: 1109 Representative spoke with at [place]: Tommi Rumps  Prior Living Arrangements/Services Living arrangements for the past 2 months: [place]     Do you feel safe going back to the place where you live?: Yes          Current home services: DME    Activities of Daily Living Home Assistive Devices/Equipment: None ADL Screening (condition at time of admission) Patient's cognitive ability adequate to safely complete daily activities?: Yes Is the patient deaf or have difficulty hearing?: No Does the patient have difficulty seeing, even when wearing glasses/contacts?: No Does the patient have difficulty concentrating, remembering, or making decisions?: No Patient able to express need for assistance with ADLs?: Yes Does the patient have difficulty dressing or bathing?: No Independently performs ADLs?: Yes (appropriate for developmental age) Does the patient have difficulty walking or climbing stairs?: No Weakness of Legs: Both Weakness of Arms/Hands: None  Permission Sought/Granted                  Emotional Assessment              Admission diagnosis:  Closed right hip fracture, initial encounter ([place]) [S72.001A] Closed displaced fracture of right femoral neck ([place]) [S72.001A] Patient Active Problem List   Diagnosis Date Noted   Closed right hip fracture, initial encounter ([place]) 12/05/2022   Glaucoma 09/19/2022   Fluid overload 09/19/2022   Influenza A 08/03/2022  Hyperkalemia 08/02/2022   CHF (congestive heart failure) ([place]) 08/02/2022   Coronary artery disease 07/06/2022   Elevated troponin 07/05/2022   Prolonged QT interval 07/05/2022   GERD (gastroesophageal reflux disease) 07/05/2022   ACS (acute coronary syndrome) [facility])    NSTEMI (non-ST elevated myocardial infarction) ([place]) 07/04/2022   Delirium 12/22/2021   Hypertensive encephalopathy 12/20/2021   End stage renal disease on dialysis [facility])    Hypertensive emergency 12/18/2021   Hypervolemia associated with renal insufficiency 04/04/2019   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   Chronic combined systolic (congestive) and diastolic  (congestive) heart failure ([place]) 11/05/2015   Chest pain    Thrombocytopenia ([place]) 08/11/2015   FTT (failure to thrive) in adult [id number]   Acute systolic CHF (congestive heart failure) ([place]) 04/25/2015   HTN (hypertension) 04/19/2015   Protein-calorie malnutrition, severe ([place]) 04/19/2015   Acute on chronic diastolic CHF (congestive heart failure) ([place]) 04/18/2015   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   ESRD on dialysis [facility])    Palliative care encounter    Acute DVT of right tibial vein ([place]) 01/29/2015   Physical deconditioning 01/28/2015   Sjogren's disease ([place]) 01/28/2015   Normocytic anemia 12/17/2014   PCP:  Clinic, [place]:   [id number] [place], [place] [id number] [place] [id number] Phone: [phone] Fax: [place], [place] - [place] [address] [place] [address] Phone: [phone] Fax: [phone]  [facility] [address] [place] [address] Phone: [phone] Fax: [phone]     Social Determinants of Health (SDOH) Social History: [place]: No Food Insecurity (09/20/2022)  Housing: Low Risk  (07/05/2022)  Transportation Needs: No Transportation Needs (09/20/2022)  Utilities: Not At Risk (09/20/2022)  Tobacco Use: Low Risk  (12/07/2022)   SDOH Interventions:     Readmission Risk Interventions    07/05/2022   12:00 PM 12/22/2021    5:33 PM 05/26/2021   12:03 PM  Readmission Risk Prevention Plan  Transportation Screening Complete Complete Complete  PCP or Specialist Appt within 3-5 Days  Complete Complete  HRI or Home Care Consult Complete Complete Complete  Social Work Consult for [place] Planning/Counseling Complete Complete Complete  Palliative Care Screening Not Applicable Not  Applicable Not Applicable  Medication Review Press [facility]) Referral to Pharmacy Complete Complete

## 2022-12-07 NOTE — Progress Notes (Signed)
Physical Therapy Treatment Patient Details Name: Travis [name] MRN: [id number] DOB: Jan 03, 1941 Today's Date: 12/07/2022   History of Present Illness Patient is a 82 y/o male who presents on 3/24 with right femoral neck fx due to fall now s/p Rt THA, anterior approach 3/25. PMH includes ESRD on HD MWF, pulmonary HTN, chronic HF, CAD, HTN, early dementia, DM, DVT, NSTEMI, pulmonary fibrosis, Sjogren's disease.    PT Comments    Patient progressing well this afternoon. Improved ambulation distance with Min A for balance and use of RW. Continues to demonstrate flexed posture at hips/knees and needs cues for RW proximity and motor planning to turn to get to chair. Fatigues quickly through UEs due to heavy reliance on arms for support. Participated in there ex. Reports soreness and stiffness but no pain this afternoon. Encouraged increasing activity to prepare for d/c home. Will follow.    Recommendations for follow up therapy are one component of a multi-disciplinary discharge planning process, led by the attending physician.  Recommendations may be updated based on patient status, additional functional criteria and insurance authorization.  Follow Up Recommendations       Assistance Recommended at Discharge Frequent or constant Supervision/Assistance  Patient can return home with the following A lot of help with walking and/or transfers;A little help with bathing/dressing/bathroom;Help with stairs or ramp for entrance;Assist for transportation;Assistance with cooking/housework   Equipment Recommendations  None recommended by PT    Recommendations for Other Services       Precautions / Restrictions Precautions Precautions: Fall Restrictions Weight Bearing Restrictions: Yes RLE Weight Bearing: Weight bearing as tolerated     Mobility  Bed Mobility Overal bed mobility: Needs Assistance Bed Mobility: Supine to Sit     Supine to sit: Supervision, HOB elevated     General bed  mobility comments: Increased time to bring RLE to EOB but no physical assist needed.    Transfers Overall transfer level: Needs assistance Equipment used: Rolling walker (2 wheels) Transfers: Sit to/from Stand Sit to Stand: Min assist, From elevated surface           General transfer comment: Min A to power to standing from EOB x1. slow to rise with flexed posture. Transferred to chair post ambulation.    Ambulation/Gait Ambulation/Gait assistance: Min assist Gait Distance (Feet): 20 Feet Assistive device: Rolling walker (2 wheels) Gait Pattern/deviations: Step-through pattern, Decreased stance time - right, Trunk flexed Gait velocity: decreased Gait velocity interpretation: <1.31 ft/sec, indicative of household ambulator   General Gait Details: Slow, mildly unsteady gait with flexed posture at hips/knees and decreased stance time RLE. Cues for upright posture and RW management, difficulty with turning/motor planning to get to chair.   Stairs             Wheelchair Mobility    Modified Rankin (Stroke Patients Only)       Balance Overall balance assessment: Needs assistance Sitting-balance support: Feet supported, No upper extremity supported Sitting balance-Leahy Scale: Good     Standing balance support: During functional activity, Bilateral upper extremity supported Standing balance-Leahy Scale: Poor                              Cognition Arousal/Alertness: Awake/alert Behavior During Therapy: WFL for tasks assessed/performed Overall Cognitive Status: Within Functional Limits for tasks assessed  General Comments: for basic mobility tasks, follows commands well and answers questions appropriately, needs repetition        Exercises Total Joint Exercises Ankle Circles/Pumps: AROM, Both, 10 reps, Supine Quad Sets: AROM, Both, 10 reps, Seated Hip ABduction/ADduction: AAROM, Right, 10 reps,  Seated Long Arc Quad: 10 reps, Seated, AROM, Right    General Comments        Pertinent Vitals/Pain Pain Assessment Pain Assessment: No/denies pain    Home Living                          Prior Function            PT Goals (current goals can now be found in the care plan section) Progress towards PT goals: Progressing toward goals    Frequency    Min 5X/week      PT Plan Current plan remains appropriate    Co-evaluation              AM-PAC PT "6 Clicks" Mobility   Outcome Measure  Help needed turning from your back to your side while in a flat bed without using bedrails?: A Little Help needed moving from lying on your back to sitting on the side of a flat bed without using bedrails?: A Little Help needed moving to and from a bed to a chair (including a wheelchair)?: A Little Help needed standing up from a chair using your arms (e.g., wheelchair or bedside chair)?: A Little Help needed to walk in hospital room?: A Little Help needed climbing 3-5 steps with a railing? : A Lot 6 Click Score: 17    End of Session Equipment Utilized During Treatment: Gait belt Activity Tolerance: Patient tolerated treatment well Patient left: in chair;with call bell/phone within reach Nurse Communication: Mobility status PT Visit Diagnosis: Unsteadiness on feet (R26.81);Difficulty in walking, not elsewhere classified (R26.2);Muscle weakness (generalized) (M62.81)     Time: [id number] PT Time Calculation (min) (ACUTE ONLY): 18 min  Charges:  $Therapeutic Activity: 8-22 mins                     Marisa Severin, PT, DPT Acute Rehabilitation Services Secure chat preferred Office [phone]      Marguarite Arbour A Sabra Heck 12/07/2022, 3:57 PM

## 2022-12-07 NOTE — Progress Notes (Signed)
    Subjective:  Patient reports pain as mild.  Denies N/V/CP/SOB/Abd pain. He denies any tingling or numbness in LE bilaterally. No pain reported this morning.   Patient states he has walker at home. All questions solicited and answered.   Objective:   VITALS:   Vitals:   12/06/22 1600 12/06/22 1617 12/06/22 2152 12/07/22 0446  BP: 131/63 (!) 149/64 (!) 120/57 (!) 114/53  Pulse: (!) 58 61 62 68  Resp: 14 20 18 16   Temp: 98.3 F (36.8 C) 98 F (36.7 C) 98.3 F (36.8 C) 97.8 F (36.6 C)  TempSrc:  Oral Oral   SpO2: 94% 96% 98% 97%    NAD Neurologically intact ABD soft Neurovascular intact Sensation intact distally Intact pulses distally Dorsiflexion/Plantar flexion intact Incision: dressing C/D/I No cellulitis present Compartment soft Painless log rolling of the hip.   Lab Results  Component Value Date   WBC 10.6 (H) 12/07/2022   HGB 9.6 (L) 12/07/2022   HCT 29.5 (L) 12/07/2022   MCV 93.7 12/07/2022   PLT 169 12/07/2022   BMET    Component Value Date/Time   NA 131 (L) 12/07/2022 0151   K 4.6 12/07/2022 0151   CL 89 (L) 12/07/2022 0151   CO2 26 12/07/2022 0151   GLUCOSE 109 (H) 12/07/2022 0151   BUN 56 (H) 12/07/2022 0151   CREATININE 9.54 (H) 12/07/2022 0151   CREATININE 8.33 (H) 09/09/2016 1551   CALCIUM 8.4 (L) 12/07/2022 0151   GFRNONAA 5 (L) 12/07/2022 0151     Assessment/Plan: 1 Day Post-Op   Principal Problem:   Closed right hip fracture, initial encounter ([place]) Active Problems:   ESRD on dialysis ([place])   HTN (hypertension)   Coronary artery disease   Hyperkalemia   WBAT with walker DVT ppx:  Eliquis , SCDs, TEDS PO pain control PT/OT: PT has not seen yet. PT to come by today.  Dispo:  - Patient under care of the medical team, disposition per their recommendation. Pain medication printed in chart.    Charlott Rakes, PA-C 12/07/2022, 7:11 AM   [facility]  [place] [address], [place],  [address] Phone:  [phone] [email or web address] Facebook  [employer]

## 2022-12-07 NOTE — Progress Notes (Signed)
  Progress Note   Patient: Travis [name] [id number] DOB: Feb 06, 1941 DOA: 12/05/2022     2 DOS: the patient was seen and examined on 12/07/2022   Brief hospital course: 82 y.o. male with medical history significant for ESRD on hemodialysis, hypertension, history of lower extremity DVT with IVC filter, CAD, and Sjogren disease who presents to the emergency department with right hip pain after mechanical ground-level fall.   Plain films of the hip and pelvis reveal mildly displaced right femoral neck fracture   Assessment and Plan: 1. Right hip fracture  - Orthopedic surgery consulted  - Pt now s/p R total hip arthoplasty 3/25   2. ESRD; hyperkalemia -K noted to be as high as 6.4 on presentation -Now normalized after HD and multiple doses of lokelma - cont to follow bmet trends -cont MWF hd per Nephrology   3. CAD  - denied chest pains or sob   4. Chronic HFmrEF  - EF 45-50% on TTE from October 2023   - Appears compensated, volume will be managed with dialysis,  -continue Coreg per home regimen  5. Hyponatremia -Continue volume management via HD      Subjective: Feeling better today. Denies chest pain or sob  Physical Exam: Vitals:   12/06/22 2152 12/07/22 0446 12/07/22 0858 12/07/22 1403  BP: (!) 120/57 (!) 114/53 138/64 (!) 129/57  Pulse: 62 68 67 66  Resp: 18 16 18 18   Temp: 98.3 F (36.8 C) 97.8 F (36.6 C) 98 F (36.7 C) 98 F (36.7 C)  TempSrc: Oral     SpO2: 98% 97%  100%   General exam: Awake, laying in bed, in nad Respiratory system: Normal respiratory effort, no wheezing Cardiovascular system: regular rate, s1, s2 Gastrointestinal system: Soft, nondistended, positive BS Central nervous system: CN2-12 grossly intact, strength intact Extremities: Perfused, no clubbing Skin: Normal skin turgor, no notable skin lesions seen Psychiatry: Mood normal // no visual hallucinations   Data Reviewed:  .Labs reviewed: Na 131, K 4.6, Cr 9.54  Family  Communication: Pt in room, family not at bedside  Disposition: Status is: Inpatient Remains inpatient appropriate because: Severity of illness  Planned Discharge Destination:  Home with hh    Author: Marylu Lund, MD 12/07/2022 5:04 PM  For on call review [email or web address].

## 2022-12-07 NOTE — Evaluation (Signed)
Physical Therapy Evaluation Patient Details Name: Travis [name] MRN: [id number] DOB: Jan 12, 1941 Today's Date: 12/07/2022  History of Present Illness  Patient is a 82 y/o male who presents on 3/24 with right femoral neck fx due to fall now s/p Rt THA, anterior approach 3/25. PMH includes ESRD on HD MWF, pulmonary HTN, chronic HF, CAD, HTN, early dementia, DM, DVT, NSTEMI, pulmonary fibrosis, Sjogren's disease.  Clinical Impression  Patient presents with pain and post surgical deficits s/p above surgery. Pt lives at home with his wife and reports using RW vs bil platform walker for household vs community distances at baseline. Pt has an aide from the [place] who comes "when I need him," and assists with driving, IADLs and some ADLs. Today, pt requires MOd A for standing and Min A for gait training with use of RW for support. Instructed pt in there ex as well as positioning. Pt hoping to progress well enough to return home at d/c with HHPT. Will follow acutely to maximize independence and mobility prior to return home.       Recommendations for follow up therapy are one component of a multi-disciplinary discharge planning process, led by the attending physician.  Recommendations may be updated based on patient status, additional functional criteria and insurance authorization.  Follow Up Recommendations       Assistance Recommended at Discharge Frequent or constant Supervision/Assistance  Patient can return home with the following  A lot of help with walking and/or transfers;A little help with bathing/dressing/bathroom;Help with stairs or ramp for entrance;Assist for transportation;Assistance with cooking/housework    Equipment Recommendations None recommended by PT  Recommendations for Other Services       Functional Status Assessment Patient has had a recent decline in their functional status and demonstrates the ability to make significant improvements in function in a reasonable and  predictable amount of time.     Precautions / Restrictions Precautions Precautions: Fall Restrictions Weight Bearing Restrictions: Yes RLE Weight Bearing: Weight bearing as tolerated      Mobility  Bed Mobility Overal bed mobility: Needs Assistance Bed Mobility: Supine to Sit     Supine to sit: Min assist, HOB elevated     General bed mobility comments: Min A to bring RLE to EOB with increased time.    Transfers Overall transfer level: Needs assistance Equipment used: Rolling walker (2 wheels) Transfers: Sit to/from Stand Sit to Stand: Mod assist, From elevated surface           General transfer comment: Mod A to power to standing with cues for hand placement/technique, slow to rise in flexed posture. transferred tochair post ambulation.    Ambulation/Gait Ambulation/Gait assistance: Min assist Gait Distance (Feet): 10 Feet Assistive device: Rolling walker (2 wheels) Gait Pattern/deviations: Step-through pattern, Decreased stance time - right, Trunk flexed Gait velocity: decreased Gait velocity interpretation: <1.31 ft/sec, indicative of household ambulator   General Gait Details: Slow, mildly unsteady gait with flexed posture at hips/knees and decreased stance time RLE. Used to using bil platform walkers so flexed posture noted.  Stairs            Wheelchair Mobility    Modified Rankin (Stroke Patients Only)       Balance Overall balance assessment: Needs assistance Sitting-balance support: Feet supported, No upper extremity supported Sitting balance-Leahy Scale: Good     Standing balance support: During functional activity, Bilateral upper extremity supported Standing balance-Leahy Scale: Poor  Pertinent Vitals/Pain Pain Assessment Pain Assessment: No/denies pain    Home Living Family/patient expects to be discharged to:: Private residence Living Arrangements: Spouse/significant other Available  Help at Discharge: Family;Available 24 hours/day;Personal care attendant Type of Home: House Home Access: Ramped entrance Entrance Stairs-Rails: Right     Home Layout: One level;Laundry or work area in [place]: Kasandra Knudsen - single point;Rollator (4 wheels);Other (comment);Rolling Walker (2 wheels);Toilet riser Additional Comments: Aide drives to/from HD    Prior Function Prior Level of Function : Needs assist       Physical Assist : ADLs (physical)   ADLs (physical): Bathing;Grooming;IADLs;Dressing Mobility Comments: Uses bil platform rollator in community, RW in home and intermittently furniture surfs. ADLs Comments: Aide helps in household chores, transportation, bathing, grooming, and intermittently with dressing     Hand Dominance   Dominant Hand: Left    Extremity/Trunk Assessment   Upper Extremity Assessment Upper Extremity Assessment: Defer to OT evaluation    Lower Extremity Assessment Lower Extremity Assessment: RLE deficits/detail;Generalized weakness RLE Deficits / Details: Able to perform AP, LAQ without difficulty, post surgical deficits in hip ROM noted limited by pain RLE Sensation: WNL    Cervical / Trunk Assessment Cervical / Trunk Assessment: Normal  Communication   Communication: No difficulties  Cognition Arousal/Alertness: Awake/alert Behavior During Therapy: WFL for tasks assessed/performed Overall Cognitive Status: Within Functional Limits for tasks assessed                                 General Comments: for basic mobility tasks, follows commands well and answers questions appropriately, needs repetition        General Comments      Exercises Total Joint Exercises Ankle Circles/Pumps: AROM, Both, 10 reps, Supine Quad Sets: AROM, Both, 10 reps, Seated Long Arc Quad: 10 reps, Seated, AROM, Right   Assessment/Plan    PT Assessment Patient needs continued PT services  PT Problem List Decreased  strength;Decreased range of motion;Decreased mobility;Decreased balance;Decreased skin integrity       PT Treatment Interventions Therapeutic activities;Gait training;Therapeutic exercise;Patient/family education;Balance training;Stair training;Functional mobility training    PT Goals (Current goals can be found in the Care Plan section)  Acute Rehab PT Goals Patient Stated Goal: to get better and go home PT Goal Formulation: With patient Time For Goal Achievement: 12/21/22 Potential to Achieve Goals: Good    Frequency Min 5X/week     Co-evaluation               AM-PAC PT "6 Clicks" Mobility  Outcome Measure Help needed turning from your back to your side while in a flat bed without using bedrails?: A Little Help needed moving from lying on your back to sitting on the side of a flat bed without using bedrails?: A Little Help needed moving to and from a bed to a chair (including a wheelchair)?: A Lot Help needed standing up from a chair using your arms (e.g., wheelchair or bedside chair)?: A Lot Help needed to walk in hospital room?: A Little Help needed climbing 3-5 steps with a railing? : A Lot 6 Click Score: 15    End of Session Equipment Utilized During Treatment: Gait belt Activity Tolerance: Patient tolerated treatment well Patient left: in chair;with call bell/phone within reach Nurse Communication: Mobility status PT Visit Diagnosis: Unsteadiness on feet (R26.81);Difficulty in walking, not elsewhere classified (R26.2);Muscle weakness (generalized) (M62.81)    Time: [id number] PT Time Calculation (min) (  ACUTE ONLY): 29 min   Charges:   PT Evaluation $PT Eval Low Complexity: 1 Low PT Treatments $Therapeutic Activity: 8-22 mins        Marisa Severin, PT, DPT Acute Rehabilitation Services Secure chat preferred Office [phone]     Marguarite Arbour A Kavari Parrillo 12/07/2022, 11:23 AM

## 2022-12-07 NOTE — Progress Notes (Signed)
[name] Progress Note   Subjective:   Reports feeling well this AM, no pain. Reports mild nasal congestion. Denies SOB, CP, dizziness, nausea.   Objective Vitals:   12/06/22 1617 12/06/22 2152 12/07/22 0446 12/07/22 0858  BP: (!) 149/64 (!) 120/57 (!) 114/53 138/64  Pulse: 61 62 68 67  Resp: 20 18 16 18   Temp: 98 F (36.7 C) 98.3 F (36.8 C) 97.8 F (36.6 C) 98 F (36.7 C)  TempSrc: Oral Oral    SpO2: 96% 98% 97%    Physical Exam General: Alert male in NAD Heart: RRR, no murmurs, rubs or gallops Lungs: CTA bilaterally Abdomen: Soft, non-distended, +BS Extremities: No edema b/l lower extremities Dialysis Access: RUE AVG + bruit  Additional Objective Labs: Basic Metabolic Panel: Recent Labs  Lab 12/05/22 1417 12/05/22 2236 12/06/22 0333 12/06/22 1243 12/07/22 0151  NA 134*  --  135 135 131*  K 5.9*   < > 6.4* 6.1* 4.6  CL 93*  --  90* 94* 89*  CO2 26  --  29  --  26  GLUCOSE 71  --  114* 103* 109*  BUN 49*  --  64* 51* 56*  CREATININE 11.07*  --  11.98* 9.80* 9.54*  CALCIUM 8.8*  --  8.5*  --  8.4*  PHOS  --   --  7.8*  --   --    < > = values in this interval not displayed.   Liver Function Tests: Recent Labs  Lab 12/06/22 0333  ALBUMIN 3.7   No results for input(s): "LIPASE", "AMYLASE" in the last 168 hours. CBC: Recent Labs  Lab 12/05/22 1417 12/06/22 0333 12/06/22 1243 12/07/22 0151  WBC 9.6 10.2  --  10.6*  NEUTROABS 6.9  --   --   --   HGB 12.1* 12.0* 13.3 9.6*  HCT 38.4* 38.8* 39.0 29.5*  MCV 95.3 95.6  --  93.7  PLT 221 211  --  169   Blood Culture    Component Value Date/Time   SDES BLOOD LEFT HAND 08/12/2022 1908   SPECREQUEST  08/12/2022 1908    BOTTLES DRAWN AEROBIC ONLY Blood Culture results may not be optimal due to an inadequate volume of blood received in culture bottles   CULT  08/12/2022 1908    NO GROWTH 5 DAYS Performed at [facility], [place] [address], [place], [place] [address]    REPTSTATUS  08/17/2022 FINAL 08/12/2022 1908    Cardiac Enzymes: No results for input(s): "CKTOTAL", "CKMB", "CKMBINDEX", "TROPONINI" in the last 168 hours. CBG: No results for input(s): "GLUCAP" in the last 168 hours. Iron Studies: No results for input(s): "IRON", "TIBC", "TRANSFERRIN", "FERRITIN" in the last 72 hours. @lablastinr3 @ Studies/Results: DG Pelvis Portable  Result Date: 12/07/2022 CLINICAL DATA:  Status post right hip replacement. EXAM: PORTABLE PELVIS 1-2 VIEWS COMPARISON:  CT abdomen and pelvis 05/23/2021. FINDINGS: New total right hip arthroplasty is in place. The device is located. No fracture. No focal bony lesion. IMPRESSION: Status post right hip replacement.  No acute abnormality. Electronically Signed   By: Inge Rise M.D.   On: 12/07/2022 08:46   DG HIP UNILAT WITH PELVIS 1V RIGHT  Result Date: 12/06/2022 CLINICAL DATA:  Total hip arthroplasty EXAM: DG HIP (WITH OR WITHOUT PELVIS) 1V RIGHT COMPARISON:  Hip radiographs 1 day prior FINDINGS: Six C-arm fluoroscopic images were obtained intraoperatively and submitted for post operative interpretation. The initial image demonstrates an acute fracture through the femoral neck with improved  alignment compared to the study from 1 day prior. Subsequent images demonstrate postsurgical changes reflecting total hip arthroplasty. Hardware alignment is within expected limits, without evidence of immediate complication. Soft tissue calcification in the region of the right groin is again seen, nonspecific. Fluoro time 17 seconds, dose 1.55 mGy. Please see the performing provider's procedural report for further detail. IMPRESSION: Intraoperative images during right hip arthroplasty without evidence of immediate complication. Electronically Signed   By: Valetta Mole M.D.   On: 12/06/2022 15:07   DG C-Arm 1-60 Min-No Report  Result Date: 12/06/2022 Fluoroscopy was utilized by the requesting physician.  No radiographic interpretation.   DG C-Arm  1-60 Min-No Report  Result Date: 12/06/2022 Fluoroscopy was utilized by the requesting physician.  No radiographic interpretation.   DG Knee Right Port  Result Date: 12/05/2022 CLINICAL DATA:  Right hip fracture.  Fall EXAM: PORTABLE RIGHT KNEE - 1-2 VIEW COMPARISON:  None Available. FINDINGS: No evidence of fracture, dislocation, or joint effusion. 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 Advanced atherosclerotic vascular calcifications. Soft tissues are otherwise unremarkable. 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 No acute fracture or dislocation. Electronically Signed   By: Davina Poke D.O.   On: 12/05/2022 17:17   DG Chest 1 View  Result Date: 12/05/2022 CLINICAL DATA:  Right hip fracture EXAM: CHEST  1 VIEW COMPARISON:  09/19/2022 FINDINGS: Upper normal heart size. Mediastinal contours and pulmonary vascularity normal. Vascular stent proximal RIGHT subclavian vessel. Atherosclerotic calcification aorta. Minimal RIGHT basilar atelectasis. Lungs otherwise clear. No acute infiltrate, pleural effusion, or pneumothorax. Bones demineralized. IMPRESSION: Mild RIGHT basilar atelectasis. Aortic Atherosclerosis (ICD10-I70.0). Electronically Signed   By: Lavonia Dana M.D.   On: 12/05/2022 15:35   DG Hip Unilat W or Wo Pelvis 2-3 Views Right  Result Date: 12/05/2022 CLINICAL DATA:  Hip pain post fall EXAM: DG HIP (WITH OR WITHOUT PELVIS) 2-3V RIGHT COMPARISON:  08/25/2015 FINDINGS: Osseous demineralization. Hip joint spaces preserved. SI joints less well visualized. Mildly displaced RIGHT femoral neck fracture without dislocation. No additional fracture or dislocation. Scattered atherosclerotic calcifications in pelvis. Soft tissue calcification RIGHT groin, nonspecific. IMPRESSION: Mildly displaced RIGHT femoral neck fracture. Electronically Signed   By: Lavonia Dana M.D.   On: 12/05/2022 15:34   Medications:   apixaban  2.5 mg Oral  BID   atorvastatin  40 mg Oral q1800   calcitRIOL  2.5 mcg Oral Q M,W,F-HD   carvedilol  12.5 mg Oral BID WC   Chlorhexidine Gluconate Cloth  6 each Topical Q0600   cinacalcet  150 mg Oral Q M,W,F-HD   docusate sodium  100 mg Oral BID   feeding supplement (NEPRO CARB STEADY)  237 mL Oral BID BM   gabapentin  300 mg Oral Daily   latanoprost  1 drop Both Eyes QHS   midodrine  10 mg Oral Q M,W,F   multivitamin  1 tablet Oral QHS   pantoprazole  40 mg Oral Daily   senna  1 tablet Oral BID   timolol  1 drop Both Eyes Daily    OP Dialysis Orders: MWF [facility]  3.5h  400/1.5   70.5kg  2/2 bath  AVG   Heparin none - last HD 3/22, post wt 70.3kg - rocaltrol 2.5 mcg po tiw - sensipar 150mg  po tiw - no esa, last Hb 11.5  Assessment/Plan: Fall/ R hip fracture - per pmd / ortho, s/p R total hip arthroplasty, feeling well Hyperkalemia - Resolved, K+ 4.6 today ESRD - Continue  MWF schedule. No current issues with HD.  HTN/ volume - takes norvasc at home. No vol excess on exam.  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 Cont renvela 2 ac tid as binder.  Chronic hypotension - Getting midodrine 10mg  pre hd mwf    Anice Paganini, PA-C 12/07/2022, 10:49 AM  [facility] Pager: [phone]

## 2022-12-08 ENCOUNTER — Other Ambulatory Visit (HOSPITAL_COMMUNITY): Payer: Self-pay

## 2022-12-08 DIAGNOSIS — S72001A Fracture of unspecified part of neck of right femur, initial encounter for closed fracture: Secondary | ICD-10-CM | POA: Diagnosis not present

## 2022-12-08 LAB — RENAL FUNCTION PANEL
Albumin: 3 g/dL — ABNORMAL LOW (ref 3.5–5.0)
Anion gap: 20 — ABNORMAL HIGH (ref 5–15)
BUN: 74 mg/dL — ABNORMAL HIGH (ref 8–23)
CO2: 23 mmol/L (ref 22–32)
Calcium: 8.8 mg/dL — ABNORMAL LOW (ref 8.9–10.3)
Chloride: 87 mmol/L — ABNORMAL LOW (ref 98–111)
Creatinine, Ser: 11.92 mg/dL — ABNORMAL HIGH (ref 0.61–1.24)
GFR, Estimated: 4 mL/min — ABNORMAL LOW (ref >60)
Glucose, Bld: 105 mg/dL — ABNORMAL HIGH (ref 70–99)
Phosphorus: 9 mg/dL — ABNORMAL HIGH (ref 2.5–4.6)
Potassium: 4.9 mmol/L (ref 3.5–5.1)
Sodium: 130 mmol/L — ABNORMAL LOW (ref 135–145)

## 2022-12-08 LAB — CBC
HCT: 31.3 % — ABNORMAL LOW (ref 39.0–52.0)
Hemoglobin: 9.8 g/dL — ABNORMAL LOW (ref 13.0–17.0)
MCH: 29.5 pg (ref 26.0–34.0)
MCHC: 31.3 g/dL (ref 30.0–36.0)
MCV: 94.3 fL (ref 80.0–100.0)
Platelets: 190 10*3/uL (ref 150–400)
RBC: 3.32 MIL/uL — ABNORMAL LOW (ref 4.22–5.81)
RDW: 17.6 % — ABNORMAL HIGH (ref 11.5–15.5)
WBC: 8.6 10*3/uL (ref 4.0–10.5)
nRBC: 0 % (ref 0.0–0.2)

## 2022-12-08 MED ORDER — POLYETHYLENE GLYCOL 3350 17 GM/SCOOP PO POWD: 17.0000 g | Freq: Every day | ORAL | 0 refills | Status: DC

## 2022-12-08 MED ORDER — RENA-VITE PO TABS: 1.0000 | ORAL_TABLET | Freq: Every day | ORAL | 0 refills | Status: DC

## 2022-12-08 MED ORDER — SEVELAMER CARBONATE 800 MG PO TABS
2400.0000 mg | ORAL_TABLET | Freq: Three times a day (TID) | ORAL | Status: DC
  Administered 2022-12-08: 2400 mg via ORAL
  Filled 2022-12-08: qty 3

## 2022-12-08 MED ORDER — APIXABAN 2.5 MG PO TABS: 2.5000 mg | ORAL_TABLET | Freq: Two times a day (BID) | ORAL | 0 refills | Status: DC

## 2022-12-08 MED ORDER — NEPRO/CARBSTEADY PO LIQD: 237.0000 mL | Freq: Two times a day (BID) | ORAL | 0 refills | Status: AC

## 2022-12-08 NOTE — Discharge Summary (Signed)
Physician Discharge Summary   Patient: Travis [name] MRN: [id number] DOB: 1941-02-06  Admit date:     12/05/2022  Discharge date: {dischdate:26783}  Discharge Physician: Cordelia Poche   PCP: [facility]   Recommendations at discharge:  {Tip this will not be part of the note when signed- Example include specific recommendations for outpatient follow-up, pending tests to follow-up on. (Optional):26781}  ***  Discharge Diagnoses: Principal Problem:   Closed right hip fracture, initial encounter ([place]) Active Problems:   HTN (hypertension)   Coronary artery disease   ESRD on dialysis ([place])   Hyperkalemia  Resolved Problems:   * No resolved hospital problems. *  Hospital Course: 82 y.o. male with medical history significant for ESRD on hemodialysis, hypertension, history of lower extremity DVT with IVC filter, CAD, and Sjogren disease who presents to the emergency department with right hip pain after mechanical ground-level fall.   Plain films of the hip and pelvis reveal mildly displaced right femoral neck fracture   Assessment and Plan: No notes have been filed under this hospital service. Service: Hospitalist     {Tip this will not be part of the note when signed Body mass index is 21.98 kg/m. ,  Nutrition Documentation    Flowsheet Row ED to Hosp-Admission (Current) from 12/05/2022 in [place]  Nutrition Problem Increased nutrient needs  Etiology hip fracture  Nutrition Goal Patient will meet greater than or equal to 90% of their needs  Interventions Nepro shake, MVI     ,  (Optional):26781}  {(NOTE) Pain control PDMP Statment (Optional):26782} Consultants: *** Procedures performed: ***  Disposition: {Plan; Disposition:26390} Diet recommendation:  {Diet_Plan:26776} DISCHARGE MEDICATION: Allergies as of 12/08/2022   No Known Allergies   Med Rec must be completed prior to using this [place]***        Follow-up Information     S[place], Aaron Edelman, MD. Schedule an appointment as soon as possible for a visit in 2 week(s).   Specialty: Orthopedic Surgery Why: For suture removal, For wound re-check Contact information: [address] [place] [address] [phone]         [facility], [facility] Follow up.   Specialty: Home Health Services Why: for home health services. They will call you in 1-2 days to set up an appointment time. Contact information: [place] [address] [phone]         [facility], [facility] Follow up.   Contact information: [place] [address] [id number]                Discharge Exam: Danley Danker Weights   12/08/22 0909  Weight: 69.5 kg   ***  Condition at discharge: {DC Condition:26389}  The results of significant diagnostics from this hospitalization (including imaging, microbiology, ancillary and laboratory) are listed below for reference.   Imaging Studies: DG Pelvis Portable  Result Date: 12/07/2022 CLINICAL DATA:  Status post right hip replacement. EXAM: PORTABLE PELVIS 1-2 VIEWS COMPARISON:  CT abdomen and pelvis 05/23/2021. FINDINGS: New total right hip arthroplasty is in place. The device is located. No fracture. No focal bony lesion. IMPRESSION: Status post right hip replacement.  No acute abnormality. Electronically Signed   By: Inge Rise M.D.   On: 12/07/2022 08:46   DG HIP UNILAT WITH PELVIS 1V RIGHT  Result Date: 12/06/2022 CLINICAL DATA:  Total hip arthroplasty EXAM: DG HIP (WITH OR WITHOUT PELVIS) 1V RIGHT COMPARISON:  Hip radiographs 1 day prior  FINDINGS: Six C-arm fluoroscopic images were obtained intraoperatively and submitted for post operative interpretation. The initial image demonstrates an acute fracture through the femoral neck with improved alignment compared to the study from 1 day prior. Subsequent images demonstrate  postsurgical changes reflecting total hip arthroplasty. Hardware alignment is within expected limits, without evidence of immediate complication. Soft tissue calcification in the region of the right groin is again seen, nonspecific. Fluoro time 17 seconds, dose 1.55 mGy. Please see the performing provider's procedural report for further detail. IMPRESSION: Intraoperative images during right hip arthroplasty without evidence of immediate complication. Electronically Signed   By: Valetta Mole M.D.   On: 12/06/2022 15:07   DG C-Arm 1-60 Min-No Report  Result Date: 12/06/2022 Fluoroscopy was utilized by the requesting physician.  No radiographic interpretation.   DG C-Arm 1-60 Min-No Report  Result Date: 12/06/2022 Fluoroscopy was utilized by the requesting physician.  No radiographic interpretation.   DG Knee Right Port  Result Date: 12/05/2022 CLINICAL DATA:  Right hip fracture.  Fall EXAM: PORTABLE RIGHT KNEE - 1-2 VIEW COMPARISON:  None Available. FINDINGS: No evidence of fracture, dislocation, or joint effusion. 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 Advanced atherosclerotic vascular calcifications. Soft tissues are otherwise unremarkable. 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 No acute fracture or dislocation. Electronically Signed   By: Davina Poke D.O.   On: 12/05/2022 17:17   DG Chest 1 View  Result Date: 12/05/2022 CLINICAL DATA:  Right hip fracture EXAM: CHEST  1 VIEW COMPARISON:  09/19/2022 FINDINGS: Upper normal heart size. Mediastinal contours and pulmonary vascularity normal. Vascular stent proximal RIGHT subclavian vessel. Atherosclerotic calcification aorta. Minimal RIGHT basilar atelectasis. Lungs otherwise clear. No acute infiltrate, pleural effusion, or pneumothorax. Bones demineralized. IMPRESSION: Mild RIGHT basilar atelectasis. Aortic Atherosclerosis (ICD10-I70.0). Electronically Signed   By: Lavonia Dana  M.D.   On: 12/05/2022 15:35   DG Hip Unilat W or Wo Pelvis 2-3 Views Right  Result Date: 12/05/2022 CLINICAL DATA:  Hip pain post fall EXAM: DG HIP (WITH OR WITHOUT PELVIS) 2-3V RIGHT COMPARISON:  08/25/2015 FINDINGS: Osseous demineralization. Hip joint spaces preserved. SI joints less well visualized. Mildly displaced RIGHT femoral neck fracture without dislocation. No additional fracture or dislocation. Scattered atherosclerotic calcifications in pelvis. Soft tissue calcification RIGHT groin, nonspecific. IMPRESSION: Mildly displaced RIGHT femoral neck fracture. Electronically Signed   By: Lavonia Dana M.D.   On: 12/05/2022 15:34    Microbiology: Results for orders placed or performed during the hospital encounter of 12/05/22  MRSA Next Gen by PCR, Nasal     Status: None   Collection Time: 12/06/22  4:00 AM   Specimen: Nasal Mucosa; Nasal Swab  Result Value Ref Range Status   MRSA by PCR Next Gen NOT DETECTED NOT DETECTED Final    Comment: (NOTE) The GeneXpert MRSA Assay (FDA approved for NASAL specimens only), is one component of a comprehensive MRSA colonization surveillance program. It is not intended to diagnose MRSA infection nor to guide or monitor treatment for MRSA infections. Test performance is not FDA approved in patients less than 63 years old. Performed at [facility], [place] [address] [place], [place] [address]     Labs: CBC: Recent Labs  Lab 12/05/22 1417 12/06/22 0333 12/06/22 1243 12/07/22 0151 12/08/22 0206  WBC 9.6 10.2  --  10.6* 8.6  NEUTROABS 6.9  --   --   --   --   HGB 12.1* 12.0* 13.3 9.6* 9.8*  HCT 38.4* 38.8*  39.0 29.5* 31.3*  MCV 95.3 95.6  --  93.7 94.3  PLT 221 211  --  169 [id number]   Basic Metabolic Panel: Recent Labs  Lab 12/05/22 1417 12/05/22 2236 12/06/22 0333 12/06/22 1243 12/07/22 0151 12/08/22 0207  NA 134*  --  135 135 131* 130*  K 5.9* 6.1* 6.4* 6.1* 4.6 4.9  CL 93*  --  90* 94* 89* 87*  CO2 26  --  29  --  26 23  GLUCOSE  71  --  114* 103* 109* 105*  BUN 49*  --  64* 51* 56* 74*  CREATININE 11.07*  --  11.98* 9.80* 9.54* 11.92*  CALCIUM 8.8*  --  8.5*  --  8.4* 8.8*  PHOS  --   --  7.8*  --   --  9.0*   Liver Function Tests: Recent Labs  Lab 12/06/22 0333 12/08/22 0207  ALBUMIN 3.7 3.0*   CBG: No results for input(s): "GLUCAP" in the last 168 hours.  Discharge time spent: {LESS THAN/GREATER [id number] 30 minutes.  Signed: Cordelia Poche, MD [facility] 12/08/2022

## 2022-12-08 NOTE — TOC Transition Note (Signed)
Transition of Care [facility]) - CM/SW Discharge Note   Patient Details  Name: Travis [name] MRN: [id number] Date of Birth: 1940/12/31  Transition of [facility]) CM/SW Contact:  Sharin Mons, RN Phone Number: 12/08/2022, 3:54 PM   Clinical Narrative:    Patient will DC to: home Anticipated DC date: 12/08/2022 Family notified: yes,daughter  Transport by: car  Per MD patient ready for DC today.RN, patient, patient's daughter [name] ( wife called , no answer/ daughter stated will make wife aware), and [facility] notified of DC. [phone] ext [phone]. NCM called and left voice message making SW aware of d/c plan and f/u with received home health orders faxed on 3/26. [place]ah confirmed home health orders  received and authorized.  Daughter to provide transportation to home.  Post hospital f/u noted on AVS.  RNCM will sign off for now as intervention is no longer needed. Please consult [place] again if new needs arise.       Final next level of care: Home w Home Health Services Barriers to Discharge: No Barriers Identified   Patient Goals and CMS Choice CMS Medicare.gov Compare Post Acute Care list provided to:: Patient Choice offered to / list presented to : Patient  Discharge Placement                         Discharge Plan and Services Additional resources added to the After Visit Summary for     Discharge Planning Services: CM Consult Post Acute Care Choice: Home Health                    HH Arranged: PT, OT [facility] Agency: [place] Date [place]: 12/07/22 Time [place]: 1109 Representative spoke with at [place]: [place] ([place]) Interventions SDOH Screenings   Food Insecurity: No Food Insecurity (09/20/2022)  Housing: Low Risk  (07/05/2022)  Transportation Needs: No Transportation Needs (09/20/2022)  Utilities: Not At Risk (09/20/2022)  Tobacco Use: Low Risk  (12/07/2022)      Readmission Risk Interventions    07/05/2022   12:00 PM 12/22/2021    5:33 PM 05/26/2021   12:03 PM  Readmission Risk Prevention Plan  Transportation Screening Complete Complete Complete  PCP or Specialist Appt within 3-5 Days  Complete Complete  HRI or Home Care Consult Complete Complete Complete  Social Work Consult for [place] Planning/Counseling Complete Complete Complete  Palliative Care Screening Not Applicable Not Applicable Not Applicable  Medication Review Press photographer) Referral to Pharmacy Complete Complete

## 2022-12-08 NOTE — Progress Notes (Addendum)
Physical Therapy Treatment Patient Details Name: Travis [name] MRN: [id number] DOB: [birth year] Today's Date: 12/08/2022   History of Present Illness Patient is a 82 y/o male who presents on 3/24 with right femoral neck fx due to fall now s/p Rt THA, anterior approach 3/25. PMH includes ESRD on HD MWF, pulmonary HTN, chronic HF, CAD, HTN, early dementia, DM, DVT, NSTEMI, pulmonary fibrosis, Sjogren's disease.    PT Comments    Pt was Total A for bed mobility this session. Difficult to arouse. Assisted at Total assist to sitting EOB to see if pt RAS would assist in arousing pt. Pt continued lethargic but did open eyes. Was not following directions and continued to be very lethargic and was Max A for sitting EOB. Pt was total assist for sitting to supine. Due to pt current functional status, home set up and available assistance at home pt may require an increased level of care on discharge from acute care hospital setting if they are a long term HD patient and have this level of fatigue every time after HD. Pt RN and MD were made aware of pt current functional status and level of arousal.     Recommendations for follow up therapy are one component of a multi-disciplinary discharge planning process, led by the attending physician.  Recommendations may be updated based on patient status, additional functional criteria and insurance authorization.  Follow Up Recommendations  Can patient physically be transported by private vehicle: No    Assistance Recommended at Discharge Frequent or constant Supervision/Assistance  Patient can return home with the following A lot of help with walking and/or transfers;Help with stairs or ramp for entrance;Assist for transportation;Assistance with cooking/housework   Equipment Recommendations  Other (comment) (defer to post acute)    Recommendations for Other Services       Precautions / Restrictions Precautions Precautions: Fall Restrictions Weight  Bearing Restrictions: Yes RLE Weight Bearing: Weight bearing as tolerated     Mobility  Bed Mobility Overal bed mobility: Needs Assistance Bed Mobility: Supine to Sit, Sit to Supine     Supine to sit: Total assist Sit to supine: Total assist   General bed mobility comments: Pt assisted to sitting in order to activate RAS to improve level of arousal. Pt was unable to arouse. Sat EOB for ~3 min with significant posterior lean and pt continued to report they were falling off the bed when physically assisted to mid-line. Pt able to sit for ~20 seconds at CLose SBA before falling posteriorly and requiring Max A to total A to get back to sitting mid-line. Patient Response: Flat affect  Transfers                   General transfer comment: Unable to transfer today due to level of arousal.    Ambulation/Gait     General Gait Details: Currently pt was unable to progress due to functional status.        Balance Overall balance assessment: Needs assistance Sitting-balance support: Bilateral upper extremity supported, No upper extremity supported, Feet supported Sitting balance-Leahy Scale: Zero Sitting balance - Comments: Pt has zero balance sitting EOB with heavy posterior lean requiring Max A. Able to sit for only brief period of time prior to falling posteriorly.       Standing balance comment: Unable to get to standing this session.       Cognition Arousal/Alertness: Lethargic Behavior During Therapy: Flat affect Overall Cognitive Status: Difficult to assess  General Comments: Pt was very lethartic throughout session; RN was notified. Session ended short due to pt unable to arouse               Pertinent Vitals/Pain Pain Assessment Pain Assessment: Faces Faces Pain Scale: Hurts little more Pain Location: R hip Pain Descriptors / Indicators: Grimacing, Guarding Pain Intervention(s): Monitored during session, Limited activity within patient's  tolerance     PT Goals (current goals can now be found in the care plan section) Acute Rehab PT Goals Patient Stated Goal: to get better and go home PT Goal Formulation: With patient Time For Goal Achievement: 12/21/22 Potential to Achieve Goals: Good Progress towards PT goals: Not progressing toward goals - comment (pt was very lethargic this session and unable to sit EOB without Max A)    Frequency    Min 5X/week      PT Plan Current plan remains appropriate       AM-PAC PT "6 Clicks" Mobility   Outcome Measure  Help needed turning from your back to your side while in a flat bed without using bedrails?: Total Help needed moving from lying on your back to sitting on the side of a flat bed without using bedrails?: Total Help needed moving to and from a bed to a chair (including a wheelchair)?: Total Help needed standing up from a chair using your arms (e.g., wheelchair or bedside chair)?: Total Help needed to walk in hospital room?: Total Help needed climbing 3-5 steps with a railing? : Total 6 Click Score: 6    End of Session Equipment Utilized During Treatment: Gait belt Activity Tolerance: Patient limited by lethargy Patient left: with call bell/phone within reach;in bed;with bed alarm set Nurse Communication: Mobility status PT Visit Diagnosis: Unsteadiness on feet (R26.81);Difficulty in walking, not elsewhere classified (R26.2);Muscle weakness (generalized) (M62.81)     Time: [id number] PT Time Calculation (min) (ACUTE ONLY): 15 min  Charges:  $Therapeutic Activity: 8-22 mins                     Tomma Rakers, DPT, CLT  Acute Rehabilitation Services Office: [phone] (Secure chat preferred)    Ander Purpura 12/08/2022, 4:38 PM

## 2022-12-08 NOTE — Consult Note (Signed)
   [facility] Inpatient Consult   12/08/2022  Tammie Fearing [birth year] [id number]  Orientation with Natividad Brood, [place] [facility] Liaison for review.   Location: [place] [facility] Liaison screened remotely [facility]).   [facility]) [place] Patient: Insurance ([facility] and [facility])    Primary Care Provider:  [facility] [place] [facility] [place]   Patient screened for readmission hospitalization with noted extreme high risk score for unplanned readmission risk.  Spoke with pt's daughter [name]) who verified pt visits both the [place] and his provider with [facility] [place] for his benefits.    Plan:  HIPAA verified. This provider is listed to follow this pt for care coordination services.   [place] does not replace or interfere with any arrangements made by the Inpatient Transition of Care team.   For questions contact:    Raina Mina, RN, [place] Hours M-F 8:00 am to 5 pm [phone] mobile [phone] [Office toll free line][facility] Office Hours are M-F 8:30 - 5 pm 24 hour nurse advise line [phone] Conceirge  [email or web address]

## 2022-12-08 NOTE — Progress Notes (Signed)
D/C order noted. Contacted [place] to advise clinic of pt's d/c and that pt should resume care on Friday.   Melven Sartorius Renal Navigator [phone]

## 2022-12-08 NOTE — Progress Notes (Signed)
   12/08/22 1259  Vitals  Temp 98.7 F (37.1 C)  Temp Source Oral  BP (!) 95/54  MAP (mmHg) 68  BP Location Left Arm  BP Method Automatic  Patient Position (if appropriate) Lying  Pulse Rate 88  Pulse Rate Source Monitor  ECG Heart Rate 88  Resp 19  Oxygen Therapy  SpO2 98 %  O2 Device Room Air  Patient Activity (if Appropriate) In bed  Pulse Oximetry Type Continuous  During Treatment Monitoring  Blood Flow Rate (mL/min) 400 mL/min  Arterial Pressure (mmHg) -180 mmHg  Venous Pressure (mmHg) 220 mmHg  TMP (mmHg) 14 mmHg  Ultrafiltration Rate (mL/min) 686 mL/min  Dialysate Flow Rate (mL/min) 300 ml/min  HD Safety Checks Performed Yes  Intra-Hemodialysis Comments Tx completed  Post Treatment  Dialyzer Clearance Lightly streaked  Duration of HD Treatment -hour(s) 3.5 hour(s)  Liters Processed 83.9  Fluid Removed (mL) 1500 mL  Tolerated HD Treatment Yes  AVG/AVF Arterial Site Held (minutes) 8 minutes  AVG/AVF Venous Site Held (minutes) 8 minutes   Received patient in bed to unit.  Alert and oriented.  Informed consent signed and in chart.   TX duration:3.5 Hours  Patient tolerated well.  Transported back to the room  Alert, without acute distress.  Hand-off given to patient's nurse.   Access used: Yes Access issues: No  Total UF removed: 1500 Medication(s) given: Calcitrol 2.56mcg Post HD VS: See Above Grid Post HD weight: None   [facility]

## 2022-12-08 NOTE — Procedures (Signed)
I was present at this dialysis session. I have reviewed the session itself and made appropriate changes.   Filed Weights   12/08/22 0909  Weight: 69.5 kg    Recent Labs  Lab 12/08/22 0207  NA 130*  K 4.9  CL 87*  CO2 23  GLUCOSE 105*  BUN 74*  CREATININE 11.92*  CALCIUM 8.8*  PHOS 9.0*    Recent Labs  Lab 12/05/22 1417 12/06/22 0333 12/06/22 1243 12/07/22 0151 12/08/22 0206  WBC 9.6 10.2  --  10.6* 8.6  NEUTROABS 6.9  --   --   --   --   HGB 12.1* 12.0* 13.3 9.6* 9.8*  HCT 38.4* 38.8* 39.0 29.5* 31.3*  MCV 95.3 95.6  --  93.7 94.3  PLT 221 211  --  169 190    Scheduled Meds:  apixaban  2.5 mg Oral BID   atorvastatin  40 mg Oral q1800   calcitRIOL  2.5 mcg Oral Q M,W,F-HD   carvedilol  12.5 mg Oral BID WC   Chlorhexidine Gluconate Cloth  6 each Topical Q0600   Chlorhexidine Gluconate Cloth  6 each Topical Q0600   cinacalcet  150 mg Oral Q M,W,F-HD   darbepoetin (ARANESP) injection - DIALYSIS  40 mcg Subcutaneous Q Wed-1800   docusate sodium  100 mg Oral BID   feeding supplement (NEPRO CARB STEADY)  237 mL Oral BID BM   gabapentin  300 mg Oral Daily   latanoprost  1 drop Both Eyes QHS   midodrine  10 mg Oral Q M,W,F   multivitamin  1 tablet Oral QHS   pantoprazole  40 mg Oral Daily   senna  1 tablet Oral BID   sevelamer carbonate  2,400 mg Oral TID WC   timolol  1 drop Both Eyes Daily   Continuous Infusions: PRN Meds:.albuterol, bisacodyl, HYDROmorphone (DILAUDID) injection, menthol-cetylpyridinium **OR** phenol, methocarbamol, metoCLOPramide **OR** metoCLOPramide (REGLAN) injection, nitroGLYCERIN, oxyCODONE, polyethylene glycol, senna   Santiago Bumpers,  MD 12/08/2022, 10:07 AM

## 2022-12-08 NOTE — Progress Notes (Signed)
    Subjective:  Patient reports pain as mild to moderate.  Denies N/V/CP/SOB/Abd pain currently. Family at bedside states he had nausea and vomiting overnight. He denies current pain. Denies any tingling or numbness in LE bilaterally.   Objective:   VITALS:   Vitals:   12/07/22 1403 12/07/22 1850 12/07/22 1959 12/08/22 0504  BP: (!) 129/57 128/64 120/60 130/65  Pulse: 66 66 68 61  Resp: 18  18 17   Temp: 98 F (36.7 C)  98.4 F (36.9 C)   TempSrc:   Oral   SpO2: 100%       Patient sleeping woken up for exam. NAD Neurologically intact ABD soft Neurovascular intact Sensation intact distally Intact pulses distally Dorsiflexion/Plantar flexion intact Incision: dressing C/D/I No cellulitis present Compartment soft   Lab Results  Component Value Date   WBC 8.6 12/08/2022   HGB 9.8 (L) 12/08/2022   HCT 31.3 (L) 12/08/2022   MCV 94.3 12/08/2022   PLT 190 12/08/2022   BMET    Component Value Date/Time   NA 130 (L) 12/08/2022 0207   K 4.9 12/08/2022 0207   CL 87 (L) 12/08/2022 0207   CO2 23 12/08/2022 0207   GLUCOSE 105 (H) 12/08/2022 0207   BUN 74 (H) 12/08/2022 0207   CREATININE 11.92 (H) 12/08/2022 0207   CREATININE 8.33 (H) 09/09/2016 1551   CALCIUM 8.8 (L) 12/08/2022 0207   GFRNONAA 4 (L) 12/08/2022 0207     Assessment/Plan: 2 Days Post-Op   Principal Problem:   Closed right hip fracture, initial encounter ([place]) Active Problems:   ESRD on dialysis ([place])   HTN (hypertension)   Coronary artery disease   Hyperkalemia   WBAT with walker DVT ppx:  Eliquis , SCDs, TEDS PO pain control PT/OT: Patient ambulated short distances with PT yesterday. Continue PT today.  Dispo: Disposition per medical team. Discussion about d/c home with HHPT. Has equipment needed at home. Pain medication printed in chart.    Marciano Sequin [facility] 12/08/2022, 6:23 AM  [facility]  [facility] [address], [place], [place] [address] Phone:  [phone] [email or web address] Facebook  [employer]

## 2022-12-08 NOTE — Progress Notes (Signed)
[facility] Progress Note   Subjective:   Sleepy this AM but reports he is feeling well. Denies CP, palpitations and SOB. Reportedly had vomiting overnight.   Objective Vitals:   12/07/22 1850 12/07/22 1959 12/08/22 0504 12/08/22 0825  BP: 128/64 120/60 130/65 (!) 122/59  Pulse: 66 68 61 90  Resp:  18 17 17   Temp:  98.4 F (36.9 C)  99.5 F (37.5 C)  TempSrc:  Oral  Oral  SpO2:    94%   Physical Exam General: sleeping but awakens to voice, NAD Heart: RRR, no murmurs, rubs or gallops Lungs: CTA bilaterally Abdomen: Soft, non-distended, +BS Extremities: No edema b/l lower extremities Dialysis Access: RUE AVG + bruit    Additional Objective Labs: Basic Metabolic Panel: Recent Labs  Lab 12/06/22 0333 12/06/22 1243 12/07/22 0151 12/08/22 0207  NA 135 135 131* 130*  K 6.4* 6.1* 4.6 4.9  CL 90* 94* 89* 87*  CO2 29  --  26 23  GLUCOSE 114* 103* 109* 105*  BUN 64* 51* 56* 74*  CREATININE 11.98* 9.80* 9.54* 11.92*  CALCIUM 8.5*  --  8.4* 8.8*  PHOS 7.8*  --   --  9.0*   Liver Function Tests: Recent Labs  Lab 12/06/22 0333 12/08/22 0207  ALBUMIN 3.7 3.0*   No results for input(s): "LIPASE", "AMYLASE" in the last 168 hours. CBC: Recent Labs  Lab 12/05/22 1417 12/06/22 0333 12/06/22 1243 12/07/22 0151 12/08/22 0206  WBC 9.6 10.2  --  10.6* 8.6  NEUTROABS 6.9  --   --   --   --   HGB 12.1* 12.0* 13.3 9.6* 9.8*  HCT 38.4* 38.8* 39.0 29.5* 31.3*  MCV 95.3 95.6  --  93.7 94.3  PLT 221 211  --  169 190   Blood Culture    Component Value Date/Time   SDES BLOOD LEFT HAND 08/12/2022 1908   SPECREQUEST  08/12/2022 1908    BOTTLES DRAWN AEROBIC ONLY Blood Culture results may not be optimal due to an inadequate volume of blood received in culture bottles   CULT  08/12/2022 1908    NO GROWTH 5 DAYS Performed at [facility] [address], [place], [place] [address]    REPTSTATUS 08/17/2022 FINAL 08/12/2022 1908    Cardiac Enzymes: No  results for input(s): "CKTOTAL", "CKMB", "CKMBINDEX", "TROPONINI" in the last 168 hours. CBG: No results for input(s): "GLUCAP" in the last 168 hours. Iron Studies: No results for input(s): "IRON", "TIBC", "TRANSFERRIN", "FERRITIN" in the last 72 hours. @lablastinr3 @ Studies/Results: DG Pelvis Portable  Result Date: 12/07/2022 CLINICAL DATA:  Status post right hip replacement. EXAM: PORTABLE PELVIS 1-2 VIEWS COMPARISON:  CT abdomen and pelvis 05/23/2021. FINDINGS: New total right hip arthroplasty is in place. The device is located. No fracture. No focal bony lesion. IMPRESSION: Status post right hip replacement.  No acute abnormality. Electronically Signed   By: Inge Rise M.D.   On: 12/07/2022 08:46   DG HIP UNILAT WITH PELVIS 1V RIGHT  Result Date: 12/06/2022 CLINICAL DATA:  Total hip arthroplasty EXAM: DG HIP (WITH OR WITHOUT PELVIS) 1V RIGHT COMPARISON:  Hip radiographs 1 day prior FINDINGS: Six C-arm fluoroscopic images were obtained intraoperatively and submitted for post operative interpretation. The initial image demonstrates an acute fracture through the femoral neck with improved alignment compared to the study from 1 day prior. Subsequent images demonstrate postsurgical changes reflecting total hip arthroplasty. Hardware alignment is within expected limits, without evidence of immediate complication. Soft tissue calcification  in the region of the right groin is again seen, nonspecific. Fluoro time 17 seconds, dose 1.55 mGy. Please see the performing provider's procedural report for further detail. IMPRESSION: Intraoperative images during right hip arthroplasty without evidence of immediate complication. Electronically Signed   By: Valetta Mole M.D.   On: 12/06/2022 15:07   DG C-Arm 1-60 Min-No Report  Result Date: 12/06/2022 Fluoroscopy was utilized by the requesting physician.  No radiographic interpretation.   DG C-Arm 1-60 Min-No Report  Result Date: 12/06/2022 Fluoroscopy was  utilized by the requesting physician.  No radiographic interpretation.   Medications:  anticoagulant sodium citrate      apixaban  2.5 mg Oral BID   atorvastatin  40 mg Oral q1800   calcitRIOL  2.5 mcg Oral Q M,W,F-HD   carvedilol  12.5 mg Oral BID WC   Chlorhexidine Gluconate Cloth  6 each Topical Q0600   Chlorhexidine Gluconate Cloth  6 each Topical Q0600   cinacalcet  150 mg Oral Q M,W,F-HD   darbepoetin (ARANESP) injection - DIALYSIS  40 mcg Subcutaneous Q Wed-1800   docusate sodium  100 mg Oral BID   feeding supplement (NEPRO CARB STEADY)  237 mL Oral BID BM   gabapentin  300 mg Oral Daily   latanoprost  1 drop Both Eyes QHS   midodrine  10 mg Oral Q M,W,F   multivitamin  1 tablet Oral QHS   pantoprazole  40 mg Oral Daily   senna  1 tablet Oral BID   sevelamer carbonate  1,600 mg Oral TID WC   timolol  1 drop Both Eyes Daily    Outpatient Dialysis Orders: MWF [facility]  3.5h  400/1.5   70.5kg  2/2 bath  AVG   Heparin none - last HD 3/22, post wt 70.3kg - rocaltrol 2.5 mcg po tiw - sensipar 150mg  po tiw - no esa, last Hb 11.5    Assessment/Plan: Fall/ R hip fracture - per pmd / ortho, s/p R total hip arthroplasty, feeling well Hyperkalemia - Resolved, K+ 4.6 today ESRD - Continue MWF schedule. No current issues with HD.  HTN/ volume - takes norvasc at home. No vol excess on exam. 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  MBD ckd - Calcium controlled,phos elevated. Phos elevated, increasing renvela dose.  Chronic hypotension - Getting midodrine 10mg  pre hd mwf  Anice Paganini, PA-C 12/08/2022, 8:39 AM  [facility] Pager: [phone]

## 2022-12-09 ENCOUNTER — Telehealth: Payer: Self-pay | Admitting: Physician Assistant

## 2022-12-09 NOTE — Telephone Encounter (Signed)
Transition of care contact from inpatient facility  Date of Discharge: 12/08/22 Date of Contact: 12/09/22 Method of contact: Phone  Attempted to contact patient to discuss transition of care from inpatient admission. Patient did not answer the phone. Mailbox full, unable to leave message. Will follow up at outpatient dialysis.  Anice Paganini, PA-C 12/09/2022, 1:39 PM  [facility] Pager: [phone]

## 2022-12-10 ENCOUNTER — Other Ambulatory Visit: Payer: Self-pay

## 2022-12-10 ENCOUNTER — Emergency Department (HOSPITAL_COMMUNITY)
Admission: EM | Admit: 2022-12-10 | Discharge: 2022-12-11 | Disposition: A | Discharge status: Home / Self Care | Payer: No Typology Code available for payment source | Attending: Emergency Medicine | Admitting: Emergency Medicine

## 2022-12-10 ENCOUNTER — Emergency Department (HOSPITAL_COMMUNITY): Payer: No Typology Code available for payment source

## 2022-12-10 DIAGNOSIS — Z743 Need for continuous supervision: Secondary | ICD-10-CM | POA: Diagnosis not present

## 2022-12-10 DIAGNOSIS — W19XXXA Unspecified fall, initial encounter: Secondary | ICD-10-CM | POA: Diagnosis not present

## 2022-12-10 DIAGNOSIS — Z7901 Long term (current) use of anticoagulants: Secondary | ICD-10-CM | POA: Insufficient documentation

## 2022-12-10 DIAGNOSIS — Z992 Dependence on renal dialysis: Secondary | ICD-10-CM | POA: Diagnosis not present

## 2022-12-10 DIAGNOSIS — M25559 Pain in unspecified hip: Secondary | ICD-10-CM | POA: Diagnosis not present

## 2022-12-10 DIAGNOSIS — N186 End stage renal disease: Secondary | ICD-10-CM | POA: Insufficient documentation

## 2022-12-10 DIAGNOSIS — R531 Weakness: Secondary | ICD-10-CM | POA: Insufficient documentation

## 2022-12-10 LAB — CBC WITH DIFFERENTIAL/PLATELET
Abs Immature Granulocytes: 0.09 10*3/uL — ABNORMAL HIGH (ref 0.00–0.07)
Basophils Absolute: 0.1 10*3/uL (ref 0.0–0.1)
Basophils Relative: 1 %
Eosinophils Absolute: 0.3 10*3/uL (ref 0.0–0.5)
Eosinophils Relative: 3 %
HCT: 30.7 % — ABNORMAL LOW (ref 39.0–52.0)
Hemoglobin: 9.6 g/dL — ABNORMAL LOW (ref 13.0–17.0)
Immature Granulocytes: 1 %
Lymphocytes Relative: 5 %
Lymphs Abs: 0.6 10*3/uL — ABNORMAL LOW (ref 0.7–4.0)
MCH: 29.9 pg (ref 26.0–34.0)
MCHC: 31.3 g/dL (ref 30.0–36.0)
MCV: 95.6 fL (ref 80.0–100.0)
Monocytes Absolute: 2 10*3/uL — ABNORMAL HIGH (ref 0.1–1.0)
Monocytes Relative: 16 %
Neutro Abs: 9.1 10*3/uL — ABNORMAL HIGH (ref 1.7–7.7)
Neutrophils Relative %: 74 %
Platelets: 242 10*3/uL (ref 150–400)
RBC: 3.21 MIL/uL — ABNORMAL LOW (ref 4.22–5.81)
RDW: 17.5 % — ABNORMAL HIGH (ref 11.5–15.5)
WBC: 12.1 10*3/uL — ABNORMAL HIGH (ref 4.0–10.5)
nRBC: 0 % (ref 0.0–0.2)

## 2022-12-10 LAB — BASIC METABOLIC PANEL
Anion gap: 18 — ABNORMAL HIGH (ref 5–15)
BUN: 63 mg/dL — ABNORMAL HIGH (ref 8–23)
CO2: 25 mmol/L (ref 22–32)
Calcium: 9.2 mg/dL (ref 8.9–10.3)
Chloride: 88 mmol/L — ABNORMAL LOW (ref 98–111)
Creatinine, Ser: 12 mg/dL — ABNORMAL HIGH (ref 0.61–1.24)
GFR, Estimated: 4 mL/min — ABNORMAL LOW (ref >60)
Glucose, Bld: 112 mg/dL — ABNORMAL HIGH (ref 70–99)
Potassium: 4.9 mmol/L (ref 3.5–5.1)
Sodium: 131 mmol/L — ABNORMAL LOW (ref 135–145)

## 2022-12-10 MED ORDER — CHLORHEXIDINE GLUCONATE CLOTH 2 % EX PADS: 6.0000 | MEDICATED_PAD | Freq: Every day | CUTANEOUS | Status: DC

## 2022-12-10 MED ORDER — PENTAFLUOROPROP-TETRAFLUOROETH EX AERO: 1.0000 | INHALATION_SPRAY | CUTANEOUS | Status: DC | PRN

## 2022-12-10 MED ORDER — LIDOCAINE HCL (PF) 1 % IJ SOLN: 5.0000 mL | INTRAMUSCULAR | Status: DC | PRN

## 2022-12-10 MED ORDER — LIDOCAINE-PRILOCAINE 2.5-2.5 % EX CREA: 1.0000 | TOPICAL_CREAM | CUTANEOUS | Status: DC | PRN

## 2022-12-10 NOTE — Discharge Instructions (Signed)
It was a pleasure caring for you today in the emergency department. ° °Please return to the emergency department for any worsening or worrisome symptoms. ° ° °

## 2022-12-10 NOTE — Progress Notes (Signed)
Brief Nephrology Note  Patient known to service; recently dc'd after hip fracture. Brought back due to concerns for weakness by family. They feel he can likely come back home today after dialysis and think he will get stronger but didn't feel like he could make it to dialysis today so had EMS bring him here.  Will try to arrange HD here 3hr session then DC from ER. Unclear what time he can be brought up. Working with staff to accommodate.

## 2022-12-10 NOTE — ED Triage Notes (Signed)
Patient BIB EMS from home. Patient post-op right hip.M/W/F dialysis.Unable to receive dialysis due to fall.  Fall on today on right side. No LOC, No blood thinners. VSS.

## 2022-12-10 NOTE — ED Provider Notes (Signed)
  Provider Note MRN:  [id number]  Arrival date & time: 12/10/22    ED Course and Medical Decision Making  Assumed care from Dr Melina Copa at shift change.  See note from prior team for complete details, in brief:  82 yo male, poor historian Total hip recently Needs HD, family unable to get him to HD today Family wants him to go home, they have home-health Hopefully home after HD   11:19 PM Completed HD Feeling better Ready to go home PTAR for home  The patient improved significantly and was discharged in stable condition. Detailed discussions were had with the patient regarding current findings, and need for close f/u with PCP or on call doctor. The patient has been instructed to return immediately if the symptoms worsen in any way for re-evaluation. Patient verbalized understanding and is in agreement with current care plan. All questions answered prior to discharge.    Procedures  Final Clinical Impressions(s) / ED Diagnoses     ICD-10-CM   1. General weakness  R53.1     2. ESRD (end stage renal disease) on dialysis [facility])  N18.6    Z99.2       ED Discharge Orders     None       Discharge Instructions   None        Jeanell Sparrow, DO 12/10/22 2323

## 2022-12-10 NOTE — ED Provider Notes (Signed)
[facility] Provider Note   CSN: [id number] Arrival date & time: 12/10/22  1135     History {Add pertinent medical, surgical, social history, OB history to HPI:1} Chief Complaint  Patient presents with   Travis [name] is a 82 y.o. male.  He has a history of DVT on Eliquis, end-stage renal disease on dialysis Monday Wednesday Friday.  He was just admitted to the hospital for a fall with a right hip fracture that was repaired by Dr. Lyla Glassing 4 days ago.  He was discharged to home 2 days ago.  It sounds like on discharge patient was noted by PT to require quite a lot of assistance.  Per EMS today family was trying to get patient up to get him to dialysis and he was too weak and and might of fallen, patient denies an actual fall.  Family not here yet to ask.  Patient himself denies any complaints just has some baseline right hip pain.  The history is provided by the patient and the EMS personnel.  Weakness Severity:  Moderate Timing:  Constant Progression:  Unchanged Chronicity:  New Relieved by:  Nothing Worsened by:  Activity Ineffective treatments:  Rest Associated symptoms: difficulty walking   Associated symptoms: no abdominal pain, no chest pain, no fever, no shortness of breath and no vomiting        Home Medications Prior to Admission medications   Medication Sig Start Date End Date Taking? Authorizing Provider  Accu-Chek Softclix Lancets lancets Use to test blood glucose four times daily as directed. 05/26/21   Thurnell Lose, MD  albuterol (VENTOLIN HFA) 108 (90 Base) MCG/ACT inhaler Inhale 2 puffs into the lungs every 6 (six) hours as needed for wheezing or shortness of breath. 08/19/22   [provider]  apixaban (ELIQUIS) 2.5 MG TABS tablet Take 1 tablet (2.5 mg total) by mouth 2 (two) times daily. 12/08/22   Hill, Marciano Sequin, PA-C  atorvastatin (LIPITOR) 40 MG tablet Take 1 tablet (40 mg total) by mouth daily  at 6 PM. 09/21/22   Mariel Aloe, MD  atropine 1 % ophthalmic solution 1 application into the lower eyelid of affected eye Ophthalmic Once a day    [provider]  Blood Glucose Monitoring Suppl (BLOOD GLUCOSE MONITOR SYSTEM) w/Device KIT Use up to four times daily as directed. 05/26/21   Thurnell Lose, MD  Brinzolamide-Brimonidine 1-0.2 % SUSP Place 1 drop into both eyes in the morning, at noon, and at bedtime. 11/03/21   [provider]  calcitRIOL (ROCALTROL) 0.25 MCG capsule Take 13 capsules (3.25 mcg total) by mouth every Monday, Wednesday, and Friday with hemodialysis. 12/25/21   Cherene Altes, MD  carvedilol (COREG) 12.5 MG tablet Take 1 tablet (12.5 mg total) by mouth 2 (two) times daily with a meal. 09/28/22   Freada Bergeron, MD  cinacalcet (SENSIPAR) 30 MG tablet Take 3 tablets (90 mg total) by mouth every Monday, Wednesday, and Friday with hemodialysis. 12/25/21   Cherene Altes, MD  ferric gluconate 62.5 mg in sodium chloride 0.9 % 100 mL Inject 62.5 mg into the vein every Wednesday with hemodialysis. 12/30/21   Cherene Altes, MD  gabapentin (NEURONTIN) 300 MG capsule Take 300 mg by mouth daily.    [provider]  glucose blood test strip Use to test blood glucose four times daily as directed 05/26/21   Thurnell Lose, MD  HYDROcodone-acetaminophen (NORCO/VICODIN) [phone]  MG tablet Take 1 tablet by mouth every 4 (four) hours as needed for moderate pain or severe pain. 12/07/22 12/07/23  Charlott Rakes, PA-C  latanoprost (XALATAN) 0.005 % ophthalmic solution Place 1 drop into both eyes at bedtime. 05/05/21   [provider]  methocarbamol (ROBAXIN) 500 MG tablet 1 tablet Three times a day as needed    [provider]  midodrine (PROAMATINE) 10 MG tablet Take 1 tablet (10 mg total) by mouth every Monday, Wednesday, and Friday. Take 10 mg (1 tablet) by mouth before dialysis and 10 mg (1 tablet) at dialysis as directed. 09/29/22    Freada Bergeron, MD  multivitamin (RENA-VIT) TABS tablet Take 1 tablet by mouth at bedtime. 12/08/22 03/08/23  Mariel Aloe, MD  nitroGLYCERIN (NITROSTAT) 0.4 MG SL tablet Place 1 tablet (0.4 mg total) under the tongue every 5 (five) minutes as needed for chest pain (for total of 3 doses at the most). 07/08/22   Domenic Polite, MD  Nutritional Supplements (FEEDING SUPPLEMENT, NEPRO CARB STEADY,) LIQD Take 237 mLs by mouth 2 (two) times daily between meals. 12/08/22 01/07/23  Mariel Aloe, MD  omeprazole (PRILOSEC) 40 MG capsule Take 40 mg by mouth daily. 07/31/15   [provider]  polyethylene glycol powder (MIRALAX) 17 GM/SCOOP powder Take 17 g by mouth daily. Hold for diarrhea (watery stools). 12/08/22   Mariel Aloe, MD  prednisoLONE acetate (PRED FORTE) 1 % ophthalmic suspension 1 drop into affected eye Ophthalmic Twice a day    [provider]  sevelamer carbonate (RENVELA) 800 MG tablet Take 2 tablets (1,600 mg total) by mouth 3 (three) times daily with meals. 05/17/15   Cherene Altes, MD  timolol (TIMOPTIC-XR) 0.5 % ophthalmic gel-forming Place 1 drop into both eyes daily. 11/03/21   [provider]      Allergies    Patient has no known allergies.    Review of Systems   Review of Systems  Constitutional:  Negative for fever.  Respiratory:  Negative for shortness of breath.   Cardiovascular:  Negative for chest pain.  Gastrointestinal:  Negative for abdominal pain and vomiting.  Neurological:  Positive for weakness.    Physical Exam Updated Vital Signs BP (!) 144/71   Pulse 78   Temp 98.7 F (37.1 C) (Oral)   SpO2 100%  Physical Exam Vitals and nursing note reviewed.  Constitutional:      General: He is not in acute distress.    Appearance: Normal appearance. He is well-developed.  HENT:     Head: Normocephalic and atraumatic.  Eyes:     Conjunctiva/sclera: Conjunctivae normal.  Cardiovascular:     Rate and Rhythm: Normal rate and  regular rhythm.     Heart sounds: Murmur heard.  Pulmonary:     Effort: Pulmonary effort is normal. No respiratory distress.     Breath sounds: Normal breath sounds.  Abdominal:     Palpations: Abdomen is soft.     Tenderness: There is no abdominal tenderness. There is no guarding or rebound.  Musculoskeletal:        General: Tenderness present.     Cervical back: Neck supple.     Comments: He has a bandage over his right thigh and thigh compartments are soft.  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  No definite lower extremity shortening or rotation.  Skin:    General: Skin is warm and dry.     Capillary Refill: Capillary  refill takes less than 2 seconds.  Neurological:     General: No focal deficit present.     Mental Status: He is alert. He is disoriented.     Motor: No weakness.     ED Results / Procedures / Treatments   Labs (all labs ordered are listed, but only abnormal results are displayed) Labs Reviewed - No data to display  EKG None  Radiology No results found.  Procedures Procedures  {Document cardiac monitor, telemetry assessment procedure when appropriate:1}  Medications Ordered in ED Medications - No data to display  ED Course/ Medical Decision Making/ A&P   {   Click here for ABCD2, HEART and other calculatorsREFRESH Note before signing :1}                          Medical Decision Making Amount and/or Complexity of Data Reviewed Labs: ordered. Radiology: ordered.   This patient complains of ***; this involves an extensive number of treatment Options and is a complaint that carries with it a high risk of complications and morbidity. The differential includes ***  I ordered, reviewed and interpreted labs, which included *** I ordered medication *** and reviewed PMP when indicated. I ordered imaging studies which included *** and I independently    visualized and interpreted imaging which showed *** Additional history obtained  from *** Previous records obtained and reviewed *** I consulted *** and discussed lab and imaging findings and discussed disposition.  Cardiac monitoring reviewed, *** Social determinants considered, *** Critical Interventions: ***  After the interventions stated above, I reevaluated the patient and found *** Admission and further testing considered, ***   {Document critical care time when appropriate:1} {Document review of labs and clinical decision tools ie heart score, Chads2Vasc2 etc:1}  {Document your independent review of radiology images, and any outside records:1} {Document your discussion with family members, caretakers, and with consultants:1} {Document social determinants of health affecting pt's care:1} {Document your decision making why or why not admission, treatments were needed:1} Final Clinical Impression(s) / ED Diagnoses Final diagnoses:  None    Rx / DC Orders ED Discharge Orders     None

## 2022-12-11 ENCOUNTER — Other Ambulatory Visit: Payer: Self-pay

## 2022-12-11 ENCOUNTER — Emergency Department (HOSPITAL_COMMUNITY): Payer: Medicare Other

## 2022-12-11 ENCOUNTER — Emergency Department (HOSPITAL_COMMUNITY)
Admission: EM | Admit: 2022-12-11 | Discharge: 2022-12-16 | Disposition: A | Discharge status: Home / Self Care | Payer: No Typology Code available for payment source | Attending: Emergency Medicine | Admitting: Emergency Medicine

## 2022-12-11 DIAGNOSIS — M25551 Pain in right hip: Secondary | ICD-10-CM | POA: Insufficient documentation

## 2022-12-11 DIAGNOSIS — Z992 Dependence on renal dialysis: Secondary | ICD-10-CM | POA: Insufficient documentation

## 2022-12-11 DIAGNOSIS — Z7901 Long term (current) use of anticoagulants: Secondary | ICD-10-CM | POA: Insufficient documentation

## 2022-12-11 DIAGNOSIS — I499 Cardiac arrhythmia, unspecified: Secondary | ICD-10-CM | POA: Diagnosis not present

## 2022-12-11 DIAGNOSIS — R531 Weakness: Secondary | ICD-10-CM | POA: Insufficient documentation

## 2022-12-11 DIAGNOSIS — Z743 Need for continuous supervision: Secondary | ICD-10-CM | POA: Diagnosis not present

## 2022-12-11 DIAGNOSIS — R4182 Altered mental status, unspecified: Secondary | ICD-10-CM | POA: Diagnosis not present

## 2022-12-11 DIAGNOSIS — R6889 Other general symptoms and signs: Secondary | ICD-10-CM | POA: Diagnosis not present

## 2022-12-11 DIAGNOSIS — N186 End stage renal disease: Secondary | ICD-10-CM | POA: Insufficient documentation

## 2022-12-11 LAB — CBC WITH DIFFERENTIAL/PLATELET
Abs Immature Granulocytes: 0.23 10*3/uL — ABNORMAL HIGH (ref 0.00–0.07)
Basophils Absolute: 0.1 10*3/uL (ref 0.0–0.1)
Basophils Relative: 1 %
Eosinophils Absolute: 0.3 10*3/uL (ref 0.0–0.5)
Eosinophils Relative: 2 %
HCT: 29.7 % — ABNORMAL LOW (ref 39.0–52.0)
Hemoglobin: 9.6 g/dL — ABNORMAL LOW (ref 13.0–17.0)
Immature Granulocytes: 2 %
Lymphocytes Relative: 10 %
Lymphs Abs: 1.2 10*3/uL (ref 0.7–4.0)
MCH: 30.5 pg (ref 26.0–34.0)
MCHC: 32.3 g/dL (ref 30.0–36.0)
MCV: 94.3 fL (ref 80.0–100.0)
Monocytes Absolute: 2.1 10*3/uL — ABNORMAL HIGH (ref 0.1–1.0)
Monocytes Relative: 17 %
Neutro Abs: 8.1 10*3/uL — ABNORMAL HIGH (ref 1.7–7.7)
Neutrophils Relative %: 68 %
Platelets: 231 10*3/uL (ref 150–400)
RBC: 3.15 MIL/uL — ABNORMAL LOW (ref 4.22–5.81)
RDW: 17.8 % — ABNORMAL HIGH (ref 11.5–15.5)
WBC: 11.9 10*3/uL — ABNORMAL HIGH (ref 4.0–10.5)
nRBC: 0 % (ref 0.0–0.2)

## 2022-12-11 LAB — BASIC METABOLIC PANEL
Anion gap: 17 — ABNORMAL HIGH (ref 5–15)
BUN: 48 mg/dL — ABNORMAL HIGH (ref 8–23)
CO2: 28 mmol/L (ref 22–32)
Calcium: 9.6 mg/dL (ref 8.9–10.3)
Chloride: 89 mmol/L — ABNORMAL LOW (ref 98–111)
Creatinine, Ser: 10.01 mg/dL — ABNORMAL HIGH (ref 0.61–1.24)
GFR, Estimated: 5 mL/min — ABNORMAL LOW (ref >60)
Glucose, Bld: 116 mg/dL — ABNORMAL HIGH (ref 70–99)
Potassium: 4.6 mmol/L (ref 3.5–5.1)
Sodium: 134 mmol/L — ABNORMAL LOW (ref 135–145)

## 2022-12-11 MED ORDER — PANTOPRAZOLE SODIUM 40 MG PO TBEC
40.0000 mg | DELAYED_RELEASE_TABLET | Freq: Every day | ORAL | Status: DC
  Administered 2022-12-12 – 2022-12-15 (×3): 40 mg via ORAL
  Filled 2022-12-11 (×3): qty 1

## 2022-12-11 MED ORDER — HYDROCODONE-ACETAMINOPHEN 5-325 MG PO TABS
1.0000 | ORAL_TABLET | ORAL | Status: DC | PRN
  Administered 2022-12-12 – 2022-12-14 (×4): 1 via ORAL
  Filled 2022-12-11 (×4): qty 1

## 2022-12-11 MED ORDER — ALBUTEROL SULFATE HFA 108 (90 BASE) MCG/ACT IN AERS: 2.0000 | INHALATION_SPRAY | Freq: Four times a day (QID) | RESPIRATORY_TRACT | Status: DC | PRN

## 2022-12-11 MED ORDER — LATANOPROST 0.005 % OP SOLN
1.0000 [drp] | Freq: Every day | OPHTHALMIC | Status: DC
  Administered 2022-12-13 – 2022-12-15 (×3): 1 [drp] via OPHTHALMIC
  Filled 2022-12-11: qty 2.5

## 2022-12-11 MED ORDER — TIMOLOL MALEATE 0.5 % OP SOLG
1.0000 [drp] | Freq: Every day | OPHTHALMIC | Status: DC
  Administered 2022-12-12 – 2022-12-15 (×5): 1 [drp] via OPHTHALMIC
  Filled 2022-12-11: qty 5

## 2022-12-11 MED ORDER — ATORVASTATIN CALCIUM 40 MG PO TABS
40.0000 mg | ORAL_TABLET | Freq: Every day | ORAL | Status: DC
  Administered 2022-12-12 – 2022-12-14 (×3): 40 mg via ORAL
  Filled 2022-12-11 (×3): qty 1

## 2022-12-11 MED ORDER — SEVELAMER CARBONATE 800 MG PO TABS
1600.0000 mg | ORAL_TABLET | Freq: Three times a day (TID) | ORAL | Status: DC
  Administered 2022-12-12 – 2022-12-14 (×6): 1600 mg via ORAL
  Filled 2022-12-11 (×7): qty 2

## 2022-12-11 MED ORDER — NEPRO/CARBSTEADY PO LIQD
237.0000 mL | Freq: Two times a day (BID) | ORAL | Status: DC
  Administered 2022-12-12 – 2022-12-15 (×5): 237 mL via ORAL
  Filled 2022-12-11 (×6): qty 237

## 2022-12-11 MED ORDER — GABAPENTIN 300 MG PO CAPS
300.0000 mg | ORAL_CAPSULE | Freq: Every day | ORAL | Status: DC
  Administered 2022-12-12 – 2022-12-15 (×3): 300 mg via ORAL
  Filled 2022-12-11 (×3): qty 1

## 2022-12-11 MED ORDER — CARVEDILOL 12.5 MG PO TABS
12.5000 mg | ORAL_TABLET | Freq: Two times a day (BID) | ORAL | Status: DC
  Administered 2022-12-12 – 2022-12-14 (×5): 12.5 mg via ORAL
  Filled 2022-12-11 (×5): qty 1

## 2022-12-11 MED ORDER — HYDROCODONE-ACETAMINOPHEN 5-325 MG PO TABS
1.0000 | ORAL_TABLET | Freq: Once | ORAL | Status: AC
  Administered 2022-12-12: 1 via ORAL
  Filled 2022-12-11: qty 1

## 2022-12-11 MED ORDER — APIXABAN 2.5 MG PO TABS
2.5000 mg | ORAL_TABLET | Freq: Two times a day (BID) | ORAL | Status: DC
  Administered 2022-12-12 – 2022-12-15 (×7): 2.5 mg via ORAL
  Filled 2022-12-11 (×9): qty 1

## 2022-12-11 MED ORDER — BRINZOLAMIDE 1 % OP SUSP
1.0000 [drp] | Freq: Three times a day (TID) | OPHTHALMIC | Status: DC
  Administered 2022-12-12 – 2022-12-15 (×10): 1 [drp] via OPHTHALMIC
  Filled 2022-12-11: qty 10

## 2022-12-11 NOTE — ED Notes (Signed)
Please call grandson Linward Natal. At [phone] to notify of pt discharge. He will come pick up pt to transport him home.

## 2022-12-11 NOTE — ED Triage Notes (Signed)
Pt via EMS from home where caregiver reports AMS since early this morning. Normally a/o x 4, currently a/o x 2-3. 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 Pt had recent right hip surgery and denies pain at time of triage. Vitals WNL en route per EMS, CBG 131

## 2022-12-11 NOTE — ED Notes (Signed)
PTAR put in for pt after verify family would be home to receive pt. Family called back stating they would be picking pt up from ED

## 2022-12-11 NOTE — ED Provider Notes (Signed)
[place] EMERGENCY DEPARTMENT AT [facility] Provider Note   CSN: [id number] Arrival date & time: 12/11/22  1531     History  Chief Complaint  Patient presents with   Altered Mental Status    Travis [name] is a 82 y.o. male.He has a history of ESRD on dialysis day Wednesday Friday.  He dialyzed yesterday here at the hospital because they were not able to get him up to dialysis.  His caregiver is on vacation and patient requires a lot of assistance, family was not able to get him up.  They also concerned that he had a possible fall.  He was evaluated for this and had normal imaging including head CT and hip x-ray.  Patient is brought back in today by EMS for increasing confusion that was noticed upon waking up this morning.  Normally alert and oriented x 4, currently per EMS was oriented x 2-3.  No new injuries reported.  Patient complains of continued pain in the right hip.    Altered Mental Status      Home Medications Prior to Admission medications   Medication Sig Start Date End Date Taking? Authorizing Provider  Accu-Chek Softclix Lancets lancets Use to test blood glucose four times daily as directed. 05/26/21   Leroy Sea, MD  albuterol (VENTOLIN HFA) 108 (90 Base) MCG/ACT inhaler Inhale 2 puffs into the lungs every 6 (six) hours as needed for wheezing or shortness of breath. 08/19/22   [provider]  apixaban (ELIQUIS) 2.5 MG TABS tablet Take 1 tablet (2.5 mg total) by mouth 2 (two) times daily. 12/08/22   Hill, Alain Honey, PA-C  atorvastatin (LIPITOR) 40 MG tablet Take 1 tablet (40 mg total) by mouth daily at 6 PM. 09/21/22   Narda Bonds, MD  atropine 1 % ophthalmic solution 1 application into the lower eyelid of affected eye Ophthalmic Once a day    [provider]  Blood Glucose Monitoring Suppl (BLOOD GLUCOSE MONITOR SYSTEM) w/Device KIT Use up to four times daily as directed. 05/26/21   Leroy Sea, MD  Brinzolamide-Brimonidine 1-0.2  % SUSP Place 1 drop into both eyes in the morning, at noon, and at bedtime. 11/03/21   [provider]  calcitRIOL (ROCALTROL) 0.25 MCG capsule Take 13 capsules (3.25 mcg total) by mouth every Monday, Wednesday, and Friday with hemodialysis. 12/25/21   Lonia Blood, MD  carvedilol (COREG) 12.5 MG tablet Take 1 tablet (12.5 mg total) by mouth 2 (two) times daily with a meal. 09/28/22   Meriam Sprague, MD  cinacalcet (SENSIPAR) 30 MG tablet Take 3 tablets (90 mg total) by mouth every Monday, Wednesday, and Friday with hemodialysis. 12/25/21   Lonia Blood, MD  ferric gluconate 62.5 mg in sodium chloride 0.9 % 100 mL Inject 62.5 mg into the vein every Wednesday with hemodialysis. 12/30/21   Lonia Blood, MD  gabapentin (NEURONTIN) 300 MG capsule Take 300 mg by mouth daily.    [provider]  glucose blood test strip Use to test blood glucose four times daily as directed 05/26/21   Leroy Sea, MD  HYDROcodone-acetaminophen (NORCO/VICODIN) 5-325 MG tablet Take 1 tablet by mouth every 4 (four) hours as needed for moderate pain or severe pain. 12/07/22 12/07/23  Clois Dupes, PA-C  latanoprost (XALATAN) 0.005 % ophthalmic solution Place 1 drop into both eyes at bedtime. 05/05/21   [provider]  methocarbamol (ROBAXIN) 500 MG tablet 1 tablet Three times a day  as needed    [provider]  midodrine (PROAMATINE) 10 MG tablet Take 1 tablet (10 mg total) by mouth every Monday, Wednesday, and Friday. Take 10 mg (1 tablet) by mouth before dialysis and 10 mg (1 tablet) at dialysis as directed. 09/29/22   Meriam Sprague, MD  multivitamin (RENA-VIT) TABS tablet Take 1 tablet by mouth at bedtime. 12/08/22 03/08/23  Narda Bonds, MD  nitroGLYCERIN (NITROSTAT) 0.4 MG SL tablet Place 1 tablet (0.4 mg total) under the tongue every 5 (five) minutes as needed for chest pain (for total of 3 doses at the most). 07/08/22   Zannie Cove, MD  Nutritional  Supplements (FEEDING SUPPLEMENT, NEPRO CARB STEADY,) LIQD Take 237 mLs by mouth 2 (two) times daily between meals. 12/08/22 01/07/23  Narda Bonds, MD  omeprazole (PRILOSEC) 40 MG capsule Take 40 mg by mouth daily. 07/31/15   [provider]  polyethylene glycol powder (MIRALAX) 17 GM/SCOOP powder Take 17 g by mouth daily. Hold for diarrhea (watery stools). 12/08/22   Narda Bonds, MD  prednisoLONE acetate (PRED FORTE) 1 % ophthalmic suspension 1 drop into affected eye Ophthalmic Twice a day    [provider]  sevelamer carbonate (RENVELA) 800 MG tablet Take 2 tablets (1,600 mg total) by mouth 3 (three) times daily with meals. 05/17/15   Lonia Blood, MD  timolol (TIMOPTIC-XR) 0.5 % ophthalmic gel-forming Place 1 drop into both eyes daily. 11/03/21   [provider]      Allergies    Patient has no known allergies.    Review of Systems   Review of Systems  Physical Exam Updated Vital Signs BP (!) 129/56 (BP Location: Left Arm)   Pulse 77   Temp 99.2 F (37.3 C) (Oral)   Resp 16   Ht 5\' 10"  (1.778 m)   Wt 70.3 kg   SpO2 100%   BMI 22.24 kg/m  Physical Exam  ED Results / Procedures / Treatments   Labs (all labs ordered are listed, but only abnormal results are displayed) Labs Reviewed  BASIC METABOLIC PANEL  CBC WITH DIFFERENTIAL/PLATELET  URINALYSIS, ROUTINE W REFLEX MICROSCOPIC    EKG EKG Interpretation  Date/Time:  Saturday December 11 2022 15:51:56 EDT Ventricular Rate:  75 PR Interval:  227 QRS Duration: 101 QT Interval:  396 QTC Calculation: 443 R Axis:   -25 Text Interpretation: Sinus rhythm Prolonged PR interval LVH with secondary repolarization abnormality Similar to yesterday's tracing Confirmed by Alona Bene [phone]) on 12/11/2022 4:05:00 PM  Radiology DG Chest 1 View  Result Date: 12/10/2022 CLINICAL DATA:  Fall. EXAM: CHEST  1 VIEW COMPARISON:  12/05/2022. FINDINGS: No focal airspace opacity. Stable cardiac and mediastinal  contours. Unchanged right subclavian stent. No pleural effusion or pneumothorax. IMPRESSION: No evidence of acute cardiopulmonary disease. Electronically Signed   By: Orvan Falconer M.D.   On: 12/10/2022 13:19   DG Hip Unilat With Pelvis 2-3 Views Right  Result Date: 12/10/2022 CLINICAL DATA:  Fracture right femur EXAM: DG HIP (WITH OR WITHOUT PELVIS) 2-3V RIGHT COMPARISON:  12/05/2022 FINDINGS: There is interval right hip arthroplasty. No fracture or dislocation is seen. Skin staples are noted along the lateral aspect there are pockets of air in the soft tissues. Arterial calcifications are seen in soft tissues. There is coarse calcification in the soft tissues inferior to the right his skin. Inferior vena cava filter is seen. IMPRESSION: Interval right hip arthroplasty. Electronically Signed   By: Harlan Stains.D.  On: 12/10/2022 13:16   CT Head Wo Contrast  Result Date: 12/10/2022 CLINICAL DATA:  Head trauma, minor (Age >= 65y) EXAM: CT HEAD WITHOUT CONTRAST TECHNIQUE: Contiguous axial images were obtained from the base of the skull through the vertex without intravenous contrast. RADIATION DOSE REDUCTION: This exam was performed according to the departmental dose-optimization program which includes automated exposure control, adjustment of the mA and/or kV according to patient size and/or use of iterative reconstruction technique. COMPARISON:  CT head November 20, 23. FINDINGS: Brain: No evidence of acute large vascular territory infarction, hemorrhage, hydrocephalus, extra-axial collection or mass lesion/mass effect. Moderate patchy white matter hypodensities, nonspecific but compatible with chronic microvascular ischemic disease. Vascular: No hyperdense vessel.  Calcific atherosclerosis. Skull: No acute fracture. Sinuses/Orbits: Clear sinuses. No acute orbital findings. Right glaucoma shunt. Other: No mastoid effusions. IMPRESSION: 1. No evidence of acute intracranial abnormality. 2. Chronic  microvascular ischemic disease. Electronically Signed   By: Feliberto Harts M.D.   On: 12/10/2022 12:45    Procedures Procedures    Medications Ordered in ED Medications - No data to display  ED Course/ Medical Decision Making/ A&P Clinical Course as of 12/11/22 2304  Sat Dec 11, 2022  1837 Called patient's daughter Delontae Salib to get more background information but patient was here.  She states that EMS was called by patient's therapist.  He was too weak to do therapy today.  Has not been able to get out of bed since coming home.  The therapist called his supervisor who advised them to call EMS and have him brought to the ER for placement because he cannot continue to be at home. [CB]  2303 Urinalysis, Routine w reflex microscopic -Urine, Clean Catch [CB]    Clinical Course User Index [CB] Ma Rings, PA-C                             Medical Decision Making Differential diagnosis: Uremia, intracranial hemorrhage, sepsis, obesity, other Course: Patient was brought in via EMS today.  His physical therapy post was at the house and patient was too weak to participate in therapy.  Therapist reportedly called his supervisor who advised that he be brought to the ED as he cannot function at home and needs to be placed.  I discussed case in detail with patient's wife and daughter on the phone and they are requesting placement as well because he cannot get out of bed and requires full assistance for all ADLs which they are not able to provide.  Patient is able to answer all my questions, he can tell me the year, his age and that he is at [facility], his only complaint is he continues to have right hip pain.  He had x-ray of the hip including labs yesterday and was dialyzed yesterday, he will be due for dialysis on Monday.  TOC was consulted.  Amount and/or Complexity of Data Reviewed Labs: ordered. Decision-making details documented in ED Course. Radiology: ordered.  Risk OTC  drugs. Prescription drug management.   Home meds have been ordered, awaiting TOC evaluation        Final Clinical Impression(s) / ED Diagnoses Final diagnoses:  None    Rx / DC Orders ED Discharge Orders     None         Josem Kaufmann 12/12/22 0016    Maia Plan, MD 12/15/22 1729

## 2022-12-12 DIAGNOSIS — R4182 Altered mental status, unspecified: Secondary | ICD-10-CM | POA: Diagnosis not present

## 2022-12-12 NOTE — [place] FL2 (Signed)
[place] MEDICAID FL2 LEVEL OF CARE FORM     IDENTIFICATION  Patient Name: Travis [name] Birthdate: 21-Jan-1941 Sex: male Admission Date (Current Location): 12/11/2022  [facility] and [place] Number:  Herbalist and Address:  [facility] [place] [place] [address], [place], [place] [address]      Provider Number: [id number]  Attending Physician Name and Address:  Default, Provider, MD  Relative Name and Phone Number:       Current Level of Care: Hospital Recommended Level of Care: [place] Prior Approval Number:    Date Approved/Denied:   PASRR Number: [id number]  Discharge Plan: SNF    Current Diagnoses: Patient Active Problem List   Diagnosis Date Noted   Closed right hip fracture, initial encounter ([place]) 12/05/2022   Glaucoma 09/19/2022   Fluid overload 09/19/2022   Influenza A 08/03/2022   Hyperkalemia 08/02/2022   CHF (congestive heart failure) ([place]) 08/02/2022   Coronary artery disease 07/06/2022   Elevated troponin 07/05/2022   Prolonged QT interval 07/05/2022   GERD (gastroesophageal reflux disease) 07/05/2022   ACS (acute coronary syndrome) [facility])    NSTEMI (non-ST elevated myocardial infarction) ([place]) 07/04/2022   Delirium 12/22/2021   Hypertensive encephalopathy 12/20/2021   End stage renal disease on dialysis [facility])    Hypertensive emergency 12/18/2021   Hypervolemia associated with renal insufficiency 04/04/2019   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   Chest pain    Thrombocytopenia ([place]) 08/11/2015   FTT (failure to thrive) in adult Q000111Q   Acute systolic CHF (congestive heart failure) ([place]) 04/25/2015   HTN (hypertension) 04/19/2015   Protein-calorie malnutrition, severe ([place]) 04/19/2015   Acute on chronic diastolic CHF (congestive heart failure) ([place]) 04/18/2015   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   ESRD on dialysis [facility])    Palliative care encounter    Acute DVT of right tibial vein ([place]) 01/29/2015   Physical deconditioning 01/28/2015   Sjogren's disease ([place]) 01/28/2015   Normocytic anemia 12/17/2014    Orientation RESPIRATION BLADDER Height & Weight     Time, Place  Normal Continent Weight: 155 lb (70.3 kg) Height:  5\' 10"  (177.8 cm)  BEHAVIORAL SYMPTOMS/MOOD NEUROLOGICAL BOWEL NUTRITION STATUS      Continent Diet (heart healthy)  AMBULATORY STATUS COMMUNICATION OF NEEDS Skin   Extensive Assist   Normal                       Personal Care Assistance Level of Assistance  Total care, Feeding, Dressing   Feeding assistance: Maximum assistance Dressing Assistance: Maximum assistance Total Care Assistance: Maximum assistance   Functional Limitations Info  Hearing, Speech, Sight Sight Info: Adequate Hearing Info: Adequate Speech Info: Adequate    SPECIAL CARE FACTORS FREQUENCY  PT (By licensed PT), OT (By licensed OT)                    Contractures Contractures Info: Not present    Additional Factors Info  Code Status               Current Medications (12/12/2022):  This is the current hospital active medication list Current Facility-Administered Medications  Medication Dose Route Frequency Provider Last Rate Last Admin   albuterol (VENTOLIN HFA) 108 (90 Base) MCG/ACT inhaler 2 puff  2 puff Inhalation Q6H PRN Beatty, Celeste A, PA-C       apixaban (ELIQUIS) tablet 2.5  mg  2.5 mg Oral BID Sherrye Payor A, PA-C   2.5 mg at 12/12/22 0943   atorvastatin (LIPITOR) tablet 40 mg  40 mg Oral q1800 Beatty, Celeste A, PA-C       brinzolamide (AZOPT) 1 % ophthalmic suspension 1 drop  1 drop Both Eyes TID Beatty, Celeste A, PA-C   1 drop at 12/12/22 0945   carvedilol (COREG) tablet 12.5 mg  12.5 mg Oral BID WC Beatty, Celeste A, PA-C   12.5 mg at 12/12/22 0716   feeding supplement (NEPRO CARB STEADY) liquid 237 mL  237 mL Oral BID BM Beatty,  Celeste A, PA-C   237 mL at 12/12/22 1000   gabapentin (NEURONTIN) capsule 300 mg  300 mg Oral Daily Beatty, Celeste A, PA-C   300 mg at 12/12/22 0900   HYDROcodone-acetaminophen (NORCO/VICODIN) 5-325 MG per tablet 1 tablet  1 tablet Oral Q4H PRN Amedeo Gory, Celeste A, PA-C       latanoprost (XALATAN) 0.005 % ophthalmic solution 1 drop  1 drop Both Eyes QHS Beatty, Celeste A, PA-C       pantoprazole (PROTONIX) EC tablet 40 mg  40 mg Oral Daily Beatty, Celeste A, PA-C   40 mg at 12/12/22 0900   sevelamer carbonate (RENVELA) tablet 1,600 mg  1,600 mg Oral TID WC Beatty, Celeste A, PA-C   1,600 mg at 12/12/22 1300   timolol (TIMOPTIC-XR) 0.5 % ophthalmic gel-forming 1 drop  1 drop Both Eyes Daily Beatty, Celeste A, PA-C   1 drop at 12/12/22 0945   Current Outpatient Medications  Medication Sig Dispense Refill   Accu-Chek Softclix Lancets lancets Use to test blood glucose four times daily as directed. 100 each 1   albuterol (VENTOLIN HFA) 108 (90 Base) MCG/ACT inhaler Inhale 2 puffs into the lungs every 6 (six) hours as needed for wheezing or shortness of breath.     apixaban (ELIQUIS) 2.5 MG TABS tablet Take 1 tablet (2.5 mg total) by mouth 2 (two) times daily. 60 tablet 0   atorvastatin (LIPITOR) 40 MG tablet Take 1 tablet (40 mg total) by mouth daily at 6 PM. 30 tablet 0   atropine 1 % ophthalmic solution 1 application into the lower eyelid of affected eye Ophthalmic Once a day     Blood Glucose Monitoring Suppl (BLOOD GLUCOSE MONITOR SYSTEM) w/Device KIT Use up to four times daily as directed. 1 kit 1   Brinzolamide-Brimonidine 1-0.2 % SUSP Place 1 drop into both eyes in the morning, at noon, and at bedtime.     calcitRIOL (ROCALTROL) 0.25 MCG capsule Take 13 capsules (3.25 mcg total) by mouth every Monday, Wednesday, and Friday with hemodialysis.     carvedilol (COREG) 12.5 MG tablet Take 1 tablet (12.5 mg total) by mouth 2 (two) times daily with a meal. 180 tablet 2   cinacalcet (SENSIPAR) 30 MG  tablet Take 3 tablets (90 mg total) by mouth every Monday, Wednesday, and Friday with hemodialysis. 60 tablet    ferric gluconate 62.5 mg in sodium chloride 0.9 % 100 mL Inject 62.5 mg into the vein every Wednesday with hemodialysis.     gabapentin (NEURONTIN) 300 MG capsule Take 300 mg by mouth daily.     glucose blood test strip Use to test blood glucose four times daily as directed 100 each 1   HYDROcodone-acetaminophen (NORCO/VICODIN) 5-325 MG tablet Take 1 tablet by mouth every 4 (four) hours as needed for moderate pain or severe pain. 40 tablet 0   latanoprost (XALATAN) 0.005 %  ophthalmic solution Place 1 drop into both eyes at bedtime.     methocarbamol (ROBAXIN) 500 MG tablet 1 tablet Three times a day as needed     midodrine (PROAMATINE) 10 MG tablet Take 1 tablet (10 mg total) by mouth every Monday, Wednesday, and Friday. Take 10 mg (1 tablet) by mouth before dialysis and 10 mg (1 tablet) at dialysis as directed. 45 tablet 1   multivitamin (RENA-VIT) TABS tablet Take 1 tablet by mouth at bedtime. 90 tablet 0   nitroGLYCERIN (NITROSTAT) 0.4 MG SL tablet Place 1 tablet (0.4 mg total) under the tongue every 5 (five) minutes as needed for chest pain (for total of 3 doses at the most). 10 tablet 1   Nutritional Supplements (FEEDING SUPPLEMENT, NEPRO CARB STEADY,) LIQD Take 237 mLs by mouth 2 (two) times daily between meals. 14220 mL 0   omeprazole (PRILOSEC) 40 MG capsule Take 40 mg by mouth daily.  11   polyethylene glycol powder (MIRALAX) 17 GM/SCOOP powder Take 17 g by mouth daily. Hold for diarrhea (watery stools). 255 g 0   prednisoLONE acetate (PRED FORTE) 1 % ophthalmic suspension 1 drop into affected eye Ophthalmic Twice a day     sevelamer carbonate (RENVELA) 800 MG tablet Take 2 tablets (1,600 mg total) by mouth 3 (three) times daily with meals. 90 tablet 0   timolol (TIMOPTIC-XR) 0.5 % ophthalmic gel-forming Place 1 drop into both eyes daily.       Discharge Medications: Please  see discharge summary for a list of discharge medications.  Relevant Imaging Results:  Relevant Lab Results:   Additional Information [id number]  Margaretmary Dys, LCSW

## 2022-12-12 NOTE — ED Notes (Signed)
Family has gone home for the evening were updated on poc and in agreement

## 2022-12-12 NOTE — Evaluation (Signed)
Physical Therapy Evaluation Patient Details Name: Travis [name] MRN: [id number] DOB: 11-Mar-1941 Today's Date: 12/12/2022  History of Present Illness  82 y.o. male presents to [facility] hospital on 12/11/2022 with AMS. Pt was recently discharged on 3/27 after repair of R hip fx (THA). PMH: CHF, ESRD on HD, GERD, HTN, CAD, anemia, arthirits, hx of DVT, hypoglycemia, NSTEMI, pulmonary fibrosis, sjogren's disease, syncope  Clinical Impression  Pt presents to PT with deficits in ROM, strength, power, cognition, functional mobility, balance. Pt maintains eyes closed for majority of session, appears fatigued and has significant generalized weakness. Pt requires physical assistance for all aspects of mobility and demonstrates stiffness in R hip (pt reports limited to no mobility since discharge 12/08/2022). PT is unable to assist the pt into standing at this time due to weakness. PT encourages AROM of hip in an effort to reduce stiffness and improve strength. Pt will benefit from short term inpatient therapies at the time of discharge. If family elects to discharge home the pt will benefit from a manual wheelchair, hospital bed and hoyer lift.       Recommendations for follow up therapy are one component of a multi-disciplinary discharge planning process, led by the attending physician.  Recommendations may be updated based on patient status, additional functional criteria and insurance authorization.  Follow Up Recommendations Can patient physically be transported by private vehicle: No     Assistance Recommended at Discharge Frequent or constant Supervision/Assistance  Patient can return home with the following  Two people to help with walking and/or transfers;Two people to help with bathing/dressing/bathroom;Assistance with cooking/housework;Assistance with feeding;Direct supervision/assist for medications management;Direct supervision/assist for financial management;Assist for transportation;Help with stairs  or ramp for entrance    Equipment Recommendations Wheelchair (measurements PT);Hospital bed (hoyer lift)  Recommendations for Other Services       Functional Status Assessment Patient has had a recent decline in their functional status and demonstrates the ability to make significant improvements in function in a reasonable and predictable amount of time.     Precautions / Restrictions Precautions Precautions: Fall Restrictions Weight Bearing Restrictions: Yes RLE Weight Bearing: Weight bearing as tolerated      Mobility  Bed Mobility Overal bed mobility: Needs Assistance Bed Mobility: Supine to Sit, Sit to Supine     Supine to sit: Max assist, HOB elevated Sit to supine: Total assist        Transfers Overall transfer level: Needs assistance Equipment used: 1 person hand held assist, Rolling walker (2 wheels) Transfers: Sit to/from Stand             General transfer comment: attempted sit to stand once with walker, unable to clear buttocks from bed. PT then assists with knee block and face to face transfer, able to clear buttocks but unable to stand (PT notes stiffness in R hip)    Ambulation/Gait                  Stairs            Wheelchair Mobility    Modified Rankin (Stroke Patients Only)       Balance Overall balance assessment: Needs assistance Sitting-balance support: Bilateral upper extremity supported, Feet supported Sitting balance-Leahy Scale: Poor Sitting balance - Comments: minA with BUE support of bed                                     Pertinent Vitals/Pain  Pain Assessment Pain Assessment: 0-10 Pain Score: 10-Worst pain ever Pain Location: R hip Pain Descriptors / Indicators: Aching Pain Intervention(s): Monitored during session    Home Living Family/patient expects to be discharged to:: Skilled nursing facility Living Arrangements: Spouse/significant other Available Help at Discharge: Family;Available  24 hours/day;Personal care attendant Type of Home: House Home Access: Ramped entrance Entrance Stairs-Rails: Right     Home Layout: One level;Laundry or work area in [place]: Kasandra Knudsen - single point;Rollator (4 wheels);Other (comment);Rolling Walker (2 wheels);Toilet riser Additional Comments: Aide drives to/from HD    Prior Function Prior Level of Function : Needs assist             Mobility Comments: Uses bil platform rollator in community, RW in home and intermittently furniture surfs. ADLs Comments: Aide helps in household chores, transportation, bathing, grooming, and intermittently with dressing     Hand Dominance   Dominant Hand: Left    Extremity/Trunk Assessment   Upper Extremity Assessment Upper Extremity Assessment: Generalized weakness    Lower Extremity Assessment Lower Extremity Assessment: Generalized weakness RLE Deficits / Details: 3/5 ankle PF/DF, 3-/5 knee extension/flexion, 2-/5 hip flexion/abduction/adductions    Cervical / Trunk Assessment Cervical / Trunk Assessment: Kyphotic  Communication   Communication: No difficulties (soft spoken)  Cognition Arousal/Alertness: Lethargic (pt maintains eyes closed for majority of session, does respond to all questions/commands) Behavior During Therapy: Flat affect Overall Cognitive Status: Impaired/Different from baseline Area of Impairment: Safety/judgement, Awareness, Problem solving, Memory                     Memory: Decreased short-term memory   Safety/Judgement: Decreased awareness of deficits, Decreased awareness of safety Awareness: Emergent Problem Solving: Slow processing, Decreased initiation          General Comments General comments (skin integrity, edema, etc.): VSS on RA    Exercises Other Exercises Other Exercises: PT encourages pt to perform heel slides, ankle pumps, hip abduction/adduction, short arc quads   Assessment/Plan    PT Assessment Patient needs  continued PT services  PT Problem List Decreased strength;Decreased range of motion;Decreased activity tolerance;Decreased balance;Decreased mobility;Decreased cognition;Decreased knowledge of use of DME;Decreased safety awareness;Decreased knowledge of precautions;Pain       PT Treatment Interventions DME instruction;Gait training;Stair training;Therapeutic activities;Functional mobility training;Therapeutic exercise;Balance training;Neuromuscular re-education;Cognitive remediation;Patient/family education;Wheelchair mobility training    PT Goals (Current goals can be found in the Care Plan section)  Acute Rehab PT Goals Patient Stated Goal: to regain strength, reduce caregiver burden PT Goal Formulation: With patient Time For Goal Achievement: 12/26/22 Potential to Achieve Goals: Fair    Frequency Min 3X/week     Co-evaluation               AM-PAC PT "6 Clicks" Mobility  Outcome Measure Help needed turning from your back to your side while in a flat bed without using bedrails?: A Lot Help needed moving from lying on your back to sitting on the side of a flat bed without using bedrails?: A Lot Help needed moving to and from a bed to a chair (including a wheelchair)?: Total Help needed standing up from a chair using your arms (e.g., wheelchair or bedside chair)?: Total Help needed to walk in hospital room?: Total Help needed climbing 3-5 steps with a railing? : Total 6 Click Score: 8    End of Session Equipment Utilized During Treatment: Gait belt Activity Tolerance: Patient limited by fatigue Patient left: in bed;with call bell/phone within reach Nurse Communication:  Mobility status;Need for lift equipment PT Visit Diagnosis: Other abnormalities of gait and mobility (R26.89);Muscle weakness (generalized) (M62.81);History of falling (Z91.81)    Time: [id number] PT Time Calculation (min) (ACUTE ONLY): 19 min   Charges:   PT Evaluation $PT Eval Low Complexity: [place], PT, DPT Acute Rehabilitation Office [phone]   Zenaida Niece 12/12/2022, 9:17 AM

## 2022-12-12 NOTE — ED Provider Notes (Signed)
Emergency Medicine Observation Re-evaluation Note  Travis [name] is a 82 y.o. male, seen on rounds today.  Pt initially presented to the ED for complaints of Altered Mental Status Currently, the patient is sitting in bed.  Physical Exam  BP 134/62   Pulse 64   Temp 99.3 F (37.4 C) (Oral)   Resp 19   Ht 5\' 10"  (1.778 m)   Wt 70.3 kg   SpO2 98%   BMI 22.24 kg/m  Physical Exam General: Awake, alert, nondistressed Cardiac: Normal heart rate Lungs: Breathing is unlabored Psych: No agitation  ED Course / MDM  EKG:EKG Interpretation  Date/Time:  Saturday December 11 2022 15:51:56 EDT Ventricular Rate:  75 PR Interval:  227 QRS Duration: 101 QT Interval:  396 QTC Calculation: 443 R Axis:   -25 Text Interpretation: Sinus rhythm Prolonged PR interval LVH with secondary repolarization abnormality Similar to yesterday's tracing Confirmed by Nanda Quinton [phone]) on 12/11/2022 4:05:00 PM  I have reviewed the labs performed to date as well as medications administered while in observation.  Recent changes in the last 24 hours include presentation to the emergency department yesterday for concern of altered mental status at home.  He is postop day 6 from right hip surgery.  He was discharged 4 days ago with instructions to weight-bear as tolerated.  Daughter reports that physical therapist called EMS due to patient not being able to participate in physical therapy with advisement to take to ER for placement.  Patient complaining of right hip pain.  Plan was for [facility] evaluation.  There was a nursing notes yesterday evening to call grandson to pick him up.  This morning, patient remains in the ED.  His home medications have been ordered.  Plan  Current plan is for social work disposition.    Godfrey Pick, MD 12/12/22 [phone]

## 2022-12-12 NOTE — Progress Notes (Signed)
HD (at [facility] MWF)

## 2022-12-12 NOTE — ED Notes (Signed)
This RN assumed care of patient and received off going transfer of care report from off going RN. Pt is resting on gurney at this time, respirations are spontaneous, even, unlabored and symmetrical bilaterally. Pt skin tone is appropriate for ethnicity, dry and warm. Pt connected to CCM, pulse ox and BP.

## 2022-12-12 NOTE — ED Notes (Signed)
Transfer of care report given to night shift RN.

## 2022-12-12 NOTE — ED Notes (Signed)
Assumed care of dialysis pt brought over by day shift staff pending TOC some time tomorrow. Pt spouse attentive at bedside. Pt repositioned and head of bed raised so he could eat. Pt given soda to eat with meal tray. Pt a/o x 2 calm cooperative , respirations even and non labored vs wnl . Will continue to monitor as directed

## 2022-12-12 NOTE — ED Notes (Signed)
Pt currently sleeping in bed.

## 2022-12-13 DIAGNOSIS — R4182 Altered mental status, unspecified: Secondary | ICD-10-CM | POA: Diagnosis not present

## 2022-12-13 LAB — BASIC METABOLIC PANEL
Anion gap: 18 — ABNORMAL HIGH (ref 5–15)
BUN: 77 mg/dL — ABNORMAL HIGH (ref 8–23)
CO2: 28 mmol/L (ref 22–32)
Calcium: 9.4 mg/dL (ref 8.9–10.3)
Chloride: 86 mmol/L — ABNORMAL LOW (ref 98–111)
Creatinine, Ser: 13.35 mg/dL — ABNORMAL HIGH (ref 0.61–1.24)
GFR, Estimated: 3 mL/min — ABNORMAL LOW (ref >60)
Glucose, Bld: 110 mg/dL — ABNORMAL HIGH (ref 70–99)
Potassium: 5.2 mmol/L — ABNORMAL HIGH (ref 3.5–5.1)
Sodium: 132 mmol/L — ABNORMAL LOW (ref 135–145)

## 2022-12-13 LAB — CBC
HCT: 27.9 % — ABNORMAL LOW (ref 39.0–52.0)
Hemoglobin: 8.9 g/dL — ABNORMAL LOW (ref 13.0–17.0)
MCH: 30.2 pg (ref 26.0–34.0)
MCHC: 31.9 g/dL (ref 30.0–36.0)
MCV: 94.6 fL (ref 80.0–100.0)
Platelets: 246 10*3/uL (ref 150–400)
RBC: 2.95 MIL/uL — ABNORMAL LOW (ref 4.22–5.81)
RDW: 17.5 % — ABNORMAL HIGH (ref 11.5–15.5)
WBC: 13.7 10*3/uL — ABNORMAL HIGH (ref 4.0–10.5)
nRBC: 0 % (ref 0.0–0.2)

## 2022-12-13 MED ORDER — CHLORHEXIDINE GLUCONATE CLOTH 2 % EX PADS: 6.0000 | MEDICATED_PAD | Freq: Every day | CUTANEOUS | Status: DC

## 2022-12-13 NOTE — Progress Notes (Signed)
TOC CSW attempted to contact [place] [phone] ext. [phone] with the [place].  CSW left HIPPA compliant message with my contact information.   Kaison Mcparland Tarpley-Carter, MSW, LCSW-A Pronouns:  She/Her/Hers [facility] HealthTransitions of Care Clinical Social Worker Direct Number:  [phone] [email or web address]

## 2022-12-13 NOTE — Progress Notes (Signed)
Pt receives out-pt HD [facility] on MWF with 6:00 am chair time. Plans for snf noted. Will assist as needed.   Melven Sartorius Renal Navigator [phone]

## 2022-12-13 NOTE — Progress Notes (Addendum)
TOC CSW received a call from Jarreau Diedrick, pts daughter [phone].  [name] would like to accept [facility] bed offer.  [facility] has a service that transports pts to HD.   Cristella Stiver Tarpley-Carter, MSW, LCSW-A Pronouns:  She/Her/Hers [facility] HealthTransitions of Care Clinical Social Worker Direct Number:  [phone] [email or web address]

## 2022-12-13 NOTE — ED Notes (Signed)
Pt resting, arouses to conversation but falls back to sleep quickly.  Daughter at bedside.  She would like her dad to have more pain medication.  I explain to the family that it isnt time to give any more pain medications at this time and pt is more sleepy and sluggish than he has been.  We need to make sure that he is tolerating the medications in a therapeutic way.

## 2022-12-13 NOTE — ED Notes (Signed)
Provider notified pt needs orders for dialysis today as he is M/W/F and without it will end up becoming an ER patient all over again. Provider to bedside to exam patient and place morning lab orders and dialysis orders to follow

## 2022-12-13 NOTE — Progress Notes (Signed)
TOC CSW attempted to start [place] with [facility] (not accepted).  CSW then called [phone], Call Center was closed.  Kemia Wendel Tarpley-Carter, MSW, LCSW-A Pronouns:  She/Her/Hers [facility] HealthTransitions of Care Clinical Social Worker Direct Number:  [phone] [email or web address]

## 2022-12-13 NOTE — Progress Notes (Signed)
TOC CSW spoke with Tristan Schroeder, pts daughter at bedside about pts bed offers.  [name] Stated she would give me a call by 6:30pm with a decision.  Salvador Coupe Tarpley-Carter, MSW, LCSW-A Pronouns:  She/Her/Hers [facility] HealthTransitions of Care Clinical Social Worker Direct Number:  [phone] [email or web address]

## 2022-12-13 NOTE — ED Notes (Signed)
To hemodialysis 

## 2022-12-13 NOTE — Progress Notes (Signed)
HD Tx completed and tolerated well.   12/13/22 2101  Vitals  Temp (!) 97.3 F (36.3 C)  Pulse Rate 78  Resp (!) 28  BP (!) 133/93  SpO2 (!) 89 %  O2 Device Nasal Cannula  Type of Weight Post-Dialysis (unable to obtain weight on stretcher)  Oxygen Therapy  Patient Activity (if Appropriate) In bed  Pulse Oximetry Type Continuous  Oximetry Probe Site Changed Yes (noisey signal/ poor perfusion, site chnaged)  Post Treatment  Dialyzer Clearance Lightly streaked  Duration of HD Treatment -hour(s) 3.25 hour(s)  Liters Processed 71  Fluid Removed (mL) 1800 mL  Tolerated HD Treatment Yes  AVG/AVF Arterial Site Held (minutes) 10 minutes  AVG/AVF Venous Site Held (minutes) 5 minutes

## 2022-12-13 NOTE — ED Provider Notes (Signed)
  Physical Exam  BP (!) 141/62   Pulse 60   Temp 98.5 F (36.9 C) (Oral)   Resp 16   Ht 5\' 10"  (1.778 m)   Wt 70.3 kg   SpO2 100%   BMI 22.24 kg/m    I spoke with Dr. Augustin Coupe about getting the patient scheduled for dialysis today as he is a Monday Wednesday Friday dialysis patient and is currently on border in the ER.       Regan Lemming, MD 12/13/22 1037

## 2022-12-13 NOTE — ED Notes (Signed)
Spoke with family via telephone.  Daughter would like staff to give pain medication every 4 hrs around the clock even if the pt states he doesn't need or want it.  Explained to daughter that I can ask if he needs it, I can ask him about his pain, I can offer him pain medication but I can not "slip" it in with his other medications without telling him. I also explained that the pt is a dialysis/ kidney failure pt.  He is unable to metabolize and excrete the way other are and lortab around the clock could build up in his symptom and cause an overdose type effect.   Daughter states she still wants her father to have the pain medication every 4 hrs.

## 2022-12-13 NOTE — ED Notes (Signed)
Cleansed with chlorhexidine clothes, clean gown placed, tolerated well.

## 2022-12-13 NOTE — ED Notes (Signed)
Aox4, answering questions appropriately.  resp even unlabored. Breakfast setup and assisted with cutting up pancake and opening milk.

## 2022-12-13 NOTE — ED Provider Notes (Signed)
Emergency Medicine Observation Re-evaluation Note  Travis [name] is a 82 y.o. male, seen on rounds today.  Pt initially presented to the ED for complaints of Altered Mental Status Currently, the patient is sleeping.  Physical Exam  BP (!) 141/62   Pulse 60   Temp 98.5 F (36.9 C) (Oral)   Resp 16   Ht 5\' 10"  (1.778 m)   Wt 70.3 kg   SpO2 100%   BMI 22.24 kg/m  Physical Exam General: Sleeping Cardiac: Extremities well-perfused Lungs: Breathing is unlabored Psych: Deferred  ED Course / MDM  EKG:EKG Interpretation  Date/Time:  Saturday December 11 2022 15:51:56 EDT Ventricular Rate:  75 PR Interval:  227 QRS Duration: 101 QT Interval:  396 QTC Calculation: 443 R Axis:   -25 Text Interpretation: Sinus rhythm Prolonged PR interval LVH with secondary repolarization abnormality Similar to yesterday's tracing Confirmed by Nanda Quinton [phone]) on 12/11/2022 4:05:00 PM  I have reviewed the labs performed to date as well as medications administered while in observation.  Recent changes in the last 24 hours include ongoing social work efforts for SNF placement.  Plan for dialysis today.  Plan  Current plan is for SNF placement.    Godfrey Pick, MD 12/13/22 [phone]

## 2022-12-13 NOTE — Progress Notes (Addendum)
SNF referral sent to the [facility] for approval. Patient might have to use his [place] insurance due to patient going to dialysis MWF at [facility]. Janie at [facility] cannot meet patients dialysis needs.

## 2022-12-13 NOTE — ED Notes (Signed)
Blood drawn and sent to lab as per md orders

## 2022-12-14 DIAGNOSIS — R4182 Altered mental status, unspecified: Secondary | ICD-10-CM | POA: Diagnosis not present

## 2022-12-14 MED ORDER — CALCITRIOL 0.5 MCG PO CAPS
2.5000 ug | ORAL_CAPSULE | ORAL | Status: DC
  Administered 2022-12-15: 2.5 ug via ORAL
  Filled 2022-12-14 (×2): qty 5

## 2022-12-14 MED ORDER — CINACALCET HCL 30 MG PO TABS
150.0000 mg | ORAL_TABLET | ORAL | Status: DC
  Administered 2022-12-15: 150 mg via ORAL
  Filled 2022-12-14: qty 5

## 2022-12-14 MED ORDER — CHLORHEXIDINE GLUCONATE CLOTH 2 % EX PADS: 6.0000 | MEDICATED_PAD | Freq: Every day | CUTANEOUS | Status: DC

## 2022-12-14 MED ORDER — MIDODRINE HCL 5 MG PO TABS
10.0000 mg | ORAL_TABLET | ORAL | Status: DC
  Administered 2022-12-15: 10 mg via ORAL
  Filled 2022-12-14: qty 2

## 2022-12-14 NOTE — Progress Notes (Signed)
CSW spoke with Travis [name], centralized intake with [facility]. CSW confirmed the bed offer for patient. CSW also confirmed that the facility can take patient for his dialysis appointments. Patient is not managed by [employer] and the facility will start the insurance authorization today.    Patients daughter is aware that patient will need to use his [facility] insurance due to the barriers of having the [place] insurance and needing dialysis.

## 2022-12-14 NOTE — ED Notes (Signed)
Wife updated on pt's current ED status.

## 2022-12-14 NOTE — ED Notes (Signed)
Latanoprost drops received from pharmacy

## 2022-12-14 NOTE — Progress Notes (Signed)
Physical Therapy Treatment Patient Details Name: Travis [name] MRN: [id number] DOB: [birth year] Today's Date: 12/14/2022   History of Present Illness 82 y.o. male presents to [facility] hospital on 12/11/2022 with AMS. Pt was recently discharged on 3/27 after repair of R hip fx (THA). PMH: CHF, ESRD on HD, GERD, HTN, CAD, anemia, arthirits, hx of DVT, hypoglycemia, NSTEMI, pulmonary fibrosis, sjogren's disease, syncope    PT Comments    Pt demonstrates minimal progress at this time with PT. Pt continues to require significant physical assistance to perform all functional mobility tasks and remains slow to initiate. Pt with profound weakness in trunk and extremities. Pt with improved ability to flex at hips/trunk, however continues to demonstrate very poor sitting balance. PT encourages LE exercise, however pt is unlikely to perform without constant cueing and encouragement due to altered mental status. PT continues to recommend short term inpatient PT services.  Recommendations for follow up therapy are one component of a multi-disciplinary discharge planning process, led by the attending physician.  Recommendations may be updated based on patient status, additional functional criteria and insurance authorization.  Follow Up Recommendations  Can patient physically be transported by private vehicle: No    Assistance Recommended at Discharge Frequent or constant Supervision/Assistance  Patient can return home with the following Two people to help with walking and/or transfers;Two people to help with bathing/dressing/bathroom;Assistance with cooking/housework;Assistance with feeding;Direct supervision/assist for medications management;Direct supervision/assist for financial management;Assist for transportation;Help with stairs or ramp for entrance   Equipment Recommendations  Wheelchair (measurements PT);Hospital bed (hoyer lift)    Recommendations for Other Services       Precautions / Restrictions  Precautions Precautions: Fall Restrictions Weight Bearing Restrictions: Yes RLE Weight Bearing: Weight bearing as tolerated     Mobility  Bed Mobility Overal bed mobility: Needs Assistance Bed Mobility: Supine to Sit, Sit to Supine     Supine to sit: Max assist, HOB elevated Sit to supine: Total assist        Transfers Overall transfer level: Needs assistance Equipment used: 1 person hand held assist, Rolling walker (2 wheels) Transfers: Sit to/from Stand Sit to Stand: Max assist, From elevated surface, Total assist (maxA with BUE support and totalA with walker)           General transfer comment: pt with improved ability to flex trunk this session but demonstrates very poor initiation to push with UEs or to activate knee and hip extensors    Ambulation/Gait                   Stairs             Wheelchair Mobility    Modified Rankin (Stroke Patients Only)       Balance Overall balance assessment: Needs assistance Sitting-balance support: Bilateral upper extremity supported, Feet supported Sitting balance-Leahy Scale: Poor Sitting balance - Comments: mod-maxA Postural control: Posterior lean Standing balance support: Bilateral upper extremity supported, Reliant on assistive device for balance Standing balance-Leahy Scale: Zero Standing balance comment: totalA                            Cognition Arousal/Alertness: Awake/alert Behavior During Therapy: Flat affect Overall Cognitive Status: Impaired/Different from baseline Area of Impairment: Following commands, Awareness, Safety/judgement, Problem solving, Memory                     Memory: Decreased short-term memory Following Commands: Follows one step commands with increased  time, Follows multi-step commands inconsistently Safety/Judgement: Decreased awareness of deficits, Decreased awareness of safety Awareness: Emergent Problem Solving: Slow processing, Decreased  initiation          Exercises General Exercises - Lower Extremity Ankle Circles/Pumps: AROM, Both Short Arc Quad: AROM, Both (1 rep) Heel Slides: AAROM, Both (1 rep) Other Exercises Other Exercises: PT encourages LE exercises however pt with poor initiation and does not return demonstration consistently. PT believes the pt has a low likelihood of performing LE exercises without persistent cueing from another individual due to his cognitive deficits    General Comments General comments (skin integrity, edema, etc.): VSS on RA with mobility, pt on 3L [place] at rest upon PT arrival (possibly desaturating when sleeping?)      Pertinent Vitals/Pain Pain Assessment Pain Assessment: Faces Faces Pain Scale: Hurts even more Pain Location: R hip Pain Descriptors / Indicators: Grimacing Pain Intervention(s): Monitored during session    Home Living                          Prior Function            PT Goals (current goals can now be found in the care plan section) Acute Rehab PT Goals Patient Stated Goal: to regain strength, reduce caregiver burden Progress towards PT goals: Progressing toward goals (very minimal progress)    Frequency    Min 2X/week      PT Plan Current plan remains appropriate    Co-evaluation              AM-PAC PT "6 Clicks" Mobility   Outcome Measure  Help needed turning from your back to your side while in a flat bed without using bedrails?: A Lot Help needed moving from lying on your back to sitting on the side of a flat bed without using bedrails?: A Lot Help needed moving to and from a bed to a chair (including a wheelchair)?: Total Help needed standing up from a chair using your arms (e.g., wheelchair or bedside chair)?: A Lot Help needed to walk in hospital room?: Total Help needed climbing 3-5 steps with a railing? : Total 6 Click Score: 9    End of Session Equipment Utilized During Treatment: Gait belt Activity Tolerance:  Patient limited by fatigue Patient left: in bed;with call bell/phone within reach Nurse Communication: Mobility status;Need for lift equipment PT Visit Diagnosis: Other abnormalities of gait and mobility (R26.89);Muscle weakness (generalized) (M62.81);History of falling (Z91.81)     Time: [id number] PT Time Calculation (min) (ACUTE ONLY): 23 min  Charges:  $Therapeutic Activity: 23-37 mins                     Zenaida Niece, PT, DPT Acute Rehabilitation Office Gilliam Tieasha Larsen 12/14/2022, 12:28 PM

## 2022-12-14 NOTE — ED Provider Notes (Signed)
Emergency Medicine Observation Re-evaluation Note  Jatavious Gehrman is a 82 y.o. male, seen on rounds today.  Pt initially presented to the ED for complaints of Altered Mental Status Currently, the patient is awaiting nursing home placement.  Physical Exam  BP 124/63   Pulse 73   Temp 98.7 F (37.1 C) (Axillary)   Resp 17   Ht 5\' 10"  (1.778 m)   Wt 68.7 kg   SpO2 100%   BMI 21.73 kg/m  Physical Exam Alert and in no acute distress  ED Course / MDM  EKG:EKG Interpretation  Date/Time:  Saturday December 11 2022 15:51:56 EDT Ventricular Rate:  75 PR Interval:  227 QRS Duration: 101 QT Interval:  396 QTC Calculation: 443 R Axis:   -25 Text Interpretation: Sinus rhythm Prolonged PR interval LVH with secondary repolarization abnormality Similar to yesterday's tracing Confirmed by Nanda Quinton [phone]) on 12/11/2022 4:05:00 PM  I have reviewed the labs performed to date as well as medications administered while in observation.  Recent changes in the last 24 hours include none.  Plan  Current plan is for nursing home placement.    Milton Ferguson, MD 12/14/22 1120

## 2022-12-14 NOTE — Progress Notes (Signed)
CSW spoke with patients [facility], Social worker, Shcaleah Kelton-[phone] ext [phone] to make her aware of the plan of care for patient.

## 2022-12-14 NOTE — ED Notes (Signed)
Pt able to drink ginger ale. Pt also ate approximately 15% tomato soup, approximately 15% rice. Swallowed pill whole without difficulty.

## 2022-12-14 NOTE — ED Notes (Signed)
Pt able to drink full container of Nepro without difficulty.

## 2022-12-14 NOTE — Progress Notes (Signed)
Pt is boarding in ED for SNF placement it appears. Awaiting insurance auth for [facility]. Had HD yesterday evening w/ 1800 ml UF net. No c/o's today. Plan HD tomorrow.   Home meds- midodrine 10mg  per hd mwf, sl ntg prn, eliquis, coreg 12.5 bid, neurontin 300 hs, norco prn, nepro prn, renvela 1600 mg ac tid, prns/ vits/ supps  OP HD: MWF [facility]  3.5h  400/1.5   69.5kg  2/2 bath  RFA AVG   Heparin none - rocaltrol 2.5 mcg po tiw - sensipar 150 mg po tiw - no esa, last Hb 11-12 range  Kelly Splinter, MD 12/14/2022, 9:37 AM  Recent Labs  Lab 12/08/22 0207 12/10/22 1258 12/11/22 1558 12/13/22 0641  HGB  --    < > 9.6* 8.9*  ALBUMIN 3.0*  --   --   --   CALCIUM 8.8*   < > 9.6 9.4  PHOS 9.0*  --   --   --   CREATININE 11.92*   < > 10.01* 13.35*  K 4.9   < > 4.6 5.2*   < > = values in this interval not displayed.    Inpatient medications:  apixaban  2.5 mg Oral BID   atorvastatin  40 mg Oral q1800   brinzolamide  1 drop Both Eyes TID   carvedilol  12.5 mg Oral BID WC   Chlorhexidine Gluconate Cloth  6 each Topical Q0600   feeding supplement (NEPRO CARB STEADY)  237 mL Oral BID BM   gabapentin  300 mg Oral Daily   latanoprost  1 drop Both Eyes QHS   pantoprazole  40 mg Oral Daily   sevelamer carbonate  1,600 mg Oral TID WC   timolol  1 drop Both Eyes Daily    albuterol, HYDROcodone-acetaminophen

## 2022-12-15 DIAGNOSIS — R4182 Altered mental status, unspecified: Secondary | ICD-10-CM | POA: Diagnosis not present

## 2022-12-15 LAB — RENAL FUNCTION PANEL
Albumin: 2.4 g/dL — ABNORMAL LOW (ref 3.5–5.0)
Anion gap: 15 (ref 5–15)
BUN: 71 mg/dL — ABNORMAL HIGH (ref 8–23)
CO2: 25 mmol/L (ref 22–32)
Calcium: 9.6 mg/dL (ref 8.9–10.3)
Chloride: 88 mmol/L — ABNORMAL LOW (ref 98–111)
Creatinine, Ser: 12.02 mg/dL — ABNORMAL HIGH (ref 0.61–1.24)
GFR, Estimated: 4 mL/min — ABNORMAL LOW (ref >60)
Glucose, Bld: 112 mg/dL — ABNORMAL HIGH (ref 70–99)
Phosphorus: 7.8 mg/dL — ABNORMAL HIGH (ref 2.5–4.6)
Potassium: 5.2 mmol/L — ABNORMAL HIGH (ref 3.5–5.1)
Sodium: 128 mmol/L — ABNORMAL LOW (ref 135–145)

## 2022-12-15 LAB — CBC
HCT: 26.2 % — ABNORMAL LOW (ref 39.0–52.0)
Hemoglobin: 8.2 g/dL — ABNORMAL LOW (ref 13.0–17.0)
MCH: 29.9 pg (ref 26.0–34.0)
MCHC: 31.3 g/dL (ref 30.0–36.0)
MCV: 95.6 fL (ref 80.0–100.0)
Platelets: 275 10*3/uL (ref 150–400)
RBC: 2.74 MIL/uL — ABNORMAL LOW (ref 4.22–5.81)
RDW: 17.1 % — ABNORMAL HIGH (ref 11.5–15.5)
WBC: 15 10*3/uL — ABNORMAL HIGH (ref 4.0–10.5)
nRBC: 0 % (ref 0.0–0.2)

## 2022-12-15 MED ORDER — HEPARIN SODIUM (PORCINE) 1000 UNIT/ML IJ SOLN
INTRAMUSCULAR | Status: AC
  Filled 2022-12-15: qty 2

## 2022-12-15 MED ORDER — HEPARIN SODIUM (PORCINE) 1000 UNIT/ML IJ SOLN
2000.0000 [IU] | INTRAMUSCULAR | Status: DC | PRN
  Administered 2022-12-15: 2000 [IU] via INTRAVENOUS

## 2022-12-15 NOTE — Progress Notes (Signed)
Insurance authorization is pending.

## 2022-12-15 NOTE — Progress Notes (Signed)
   12/15/22 1700  During Treatment Monitoring  Intra-Hemodialysis Comments  (tx paused cartridge clotted for the second time with 2hours 4 minutes left. Dr. Jonnie Finner  notified heparin being ordered)

## 2022-12-15 NOTE — ED Notes (Signed)
Patient to dialysis at this time.

## 2022-12-15 NOTE — ED Notes (Signed)
Patient visitor at bedside. Denies needs at this time.

## 2022-12-15 NOTE — Progress Notes (Signed)
   12/15/22 1610  During Treatment Monitoring  Intra-Hemodialysis Comments  (tx paused, cartridege clotted, new cartridge priming, flushes increased every 15)

## 2022-12-15 NOTE — Progress Notes (Signed)
Post HD Tx   12/15/22 1941  Vitals  Temp 98.3 F (36.8 C)  Pulse Rate 80  Resp 20  BP (!) 168/75  SpO2 96 %  O2 Device Nasal Cannula  Weight 66.6 kg  Type of Weight Post-Dialysis  Oxygen Therapy  Patient Activity (if Appropriate) In bed  Pulse Oximetry Type Continuous  Oximetry Probe Site Changed No  O2 Flow Rate (L/min) 2 L/min (for comfort)  Post Treatment  Dialyzer Clearance Lightly streaked  Duration of HD Treatment -hour(s) 3.5 hour(s)  Liters Processed 81.8  Fluid Removed (mL) 700 mL  Tolerated HD Treatment Yes  AVG/AVF Arterial Site Held (minutes) 8 minutes  AVG/AVF Venous Site Held (minutes) 5 minutes

## 2022-12-15 NOTE — ED Provider Notes (Signed)
Emergency Medicine Observation Re-evaluation Note  Travis [name] is a 82 y.o. male, seen on rounds today.  Pt initially presented to the ED for complaints of Altered Mental Status With consideration for appropriate discharge planning  Currently, the patient is resting comfortably, has eaten breakfast, is awake and alert.  Physical Exam  BP (!) 144/74   Pulse 73   Temp 98.4 F (36.9 C) (Oral)   Resp 14   Ht 5\' 10"  (1.778 m)   Wt 68.7 kg   SpO2 100%   BMI 21.73 kg/m  Physical Exam General: No distress, sitting upright Cardiac: Regular rate and rhythm Lungs: No increased work of breathing Psych: Calm  ED Course / MDM  EKG:EKG Interpretation  Date/Time:  Saturday December 11 2022 15:51:56 EDT Ventricular Rate:  75 PR Interval:  227 QRS Duration: 101 QT Interval:  396 QTC Calculation: 443 R Axis:   -25 Text Interpretation: Sinus rhythm Prolonged PR interval LVH with secondary repolarization abnormality Similar to yesterday's tracing Confirmed by Nanda Quinton [phone]) on 12/11/2022 4:05:00 PM  I have reviewed the labs performed to date as well as medications administered while in observation.  Recent changes in the last 24 hours include no notable reports.  Plan  Current plan is for assistance with social work for safe placement.    Carmin Muskrat, MD 12/15/22 1006

## 2022-12-15 NOTE — Evaluation (Signed)
Occupational Therapy Evaluation Patient Details Name: Travis [name] MRN: [id number] DOB: 1941/03/18 Today's Date: 12/15/2022   History of Present Illness 82 y.o. male presents to [facility] hospital on 12/11/2022 with AMS. Pt was recently discharged on 3/27 after repair of R hip fx (THA). PMH: CHF, ESRD on HD, GERD, HTN, CAD, anemia, arthirits, hx of DVT, hypoglycemia, NSTEMI, pulmonary fibrosis, sjogren's disease, syncope   Clinical Impression   Pt currently at total assist +2 for most selfcare tasks simulated sit to stand and toileting, max-total assist for grooming tasks secondary to decreased sitting balance.  Prior to his fall, he was needing some assist at home for ADLs including bathing, dressing, and toileting with use of RW for mobility.  Feel based on current level, he will benefit from acute care OT to help progress ADL independence to manageable level for return home.  Will continue to follow.        Recommendations for follow up therapy are one component of a multi-disciplinary discharge planning process, led by the attending physician.  Recommendations may be updated based on patient status, additional functional criteria and insurance authorization.   Assistance Recommended at Discharge Frequent or constant Supervision/Assistance  Patient can return home with the following Two people to help with walking and/or transfers;Two people to help with bathing/dressing/bathroom;Help with stairs or ramp for entrance;Assist for transportation;Direct supervision/assist for financial management;Assistance with cooking/housework;Direct supervision/assist for medications management    Functional Status Assessment  Patient has had a recent decline in their functional status and demonstrates the ability to make significant improvements in function in a reasonable and predictable amount of time.  Equipment Recommendations  Other (comment) (TBD next venue of care)       Precautions / Restrictions  Precautions Precautions: Fall Restrictions Weight Bearing Restrictions: No RLE Weight Bearing: Weight bearing as tolerated      Mobility Bed Mobility Overal bed mobility: Needs Assistance Bed Mobility: Supine to Sit, Sit to Supine     Supine to sit: Total assist Sit to supine: Total assist   General bed mobility comments: Pt needs total assist for all aspects of bed mobility secondary to decreased initiation.    Transfers Overall transfer level: Needs assistance   Transfers: Sit to/from Stand Sit to Stand: Total assist           General transfer comment: Pt only able to achieve 1/2 stand on two attempts.      Balance Overall balance assessment: Needs assistance Sitting-balance support: Bilateral upper extremity supported, Feet supported Sitting balance-Leahy Scale: Poor Sitting balance - Comments: posterior LOB                                   ADL either performed or assessed with clinical judgement   ADL Overall ADL's : Needs assistance/impaired     Grooming: Wash/dry face;Maximal assistance;Sitting Grooming Details (indicate cue type and reason): simulated sitting EOB unsupported Upper Body Bathing: Sitting;Maximal assistance Upper Body Bathing Details (indicate cue type and reason): simulated sitting EOB Lower Body Bathing: Sit to/from stand;+2 for physical assistance       Lower Body Dressing: Total assistance;Sitting/lateral leans Lower Body Dressing Details (indicate cue type and reason): simulated Toilet Transfer: Total assistance;Squat-pivot Toilet Transfer Details (indicate cue type and reason): simulated scooting up side of bed Toileting- Clothing Manipulation and Hygiene: Total assistance;+2 for physical assistance;+2 for safety/equipment Toileting - Clothing Manipulation Details (indicate cue type and reason): simulated  Functional mobility during ADLs: Maximal assistance (supine to sit) General ADL Comments: Pt with  decreased initiation, max demonstrational cueing for transitioning from supine with max assist for scooting out to the EOB and for maintaining sitting balance.  Increased posterior lean with LOB and then therapist helped correct it to midline, he would then fall to the left.  Flexed head also noted when sitting up.  Pt's spouse present for session and discussed pt's current level and need for continued rehab to get back to his PLOF.  She voices understanding.     Vision Baseline Vision/History: 0 No visual deficits Ability to See in Adequate Light: 0 Adequate Patient Visual Report: No change from baseline Vision Assessment?: No apparent visual deficits     Perception  Not tested   Praxis  Not tested    Pertinent Vitals/Pain Pain Assessment Pain Assessment: Faces Faces Pain Scale: Hurts a little bit Pain Location: R hip Pain Descriptors / Indicators: Grimacing Pain Intervention(s): Limited activity within patient's tolerance, Monitored during session     Hand Dominance Left   Extremity/Trunk Assessment Upper Extremity Assessment Upper Extremity Assessment: Generalized weakness (not formally assessed but able to use UEs spontaneously on the bed for balance)   Lower Extremity Assessment Lower Extremity Assessment: Defer to PT evaluation   Cervical / Trunk Assessment Cervical / Trunk Assessment: Kyphotic   Communication Communication Communication: No difficulties (soft spoken)   Cognition Arousal/Alertness: Awake/alert Behavior During Therapy: Flat affect Overall Cognitive Status: History of cognitive impairments - at baseline Area of Impairment: Orientation, Memory, Awareness, Following commands, Problem solving                 Orientation Level: Time (day of week)     Following Commands: Follows one step commands inconsistently, Follows one step commands with increased time Safety/Judgement: Decreased awareness of deficits, Decreased awareness of safety Awareness:  Intellectual Problem Solving: Slow processing, Decreased initiation, Requires verbal cues, Requires tactile cues General Comments: Pt with flat affect and decreased initiation throughout session.                Home Living Family/patient expects to be discharged to:: Skilled nursing facility Living Arrangements: Spouse/significant other Available Help at Discharge: Family;Available 24 hours/day;Personal care attendant (care attendent comes 2x/wk from 9:00-5:00) Type of Home: House Home Access: Ramped entrance   Entrance Stairs-Rails: Right Home Layout: One level;Laundry or work area in [employer] Shower/Tub: Walk-in shower;Sponge bathes at [place]: [place] - single point;Rollator (4 wheels);Other (comment);Rolling Walker (2 wheels);Toilet riser   Additional Comments: Aide drives to/from HD      Prior Functioning/Environment Prior Level of Function : Needs assist           ADLs (physical): Bathing;Dressing;Toileting Mobility Comments: Uses bil platform rollator in community, RW in home and intermittently furniture surfs. ADLs Comments: Aide helps in household chores, transportation, bathing, grooming, and intermittently with dressing        OT Problem List: Decreased knowledge of use of DME or AE;Decreased strength;Decreased coordination;Decreased activity tolerance;Impaired UE functional use;Pain;Impaired balance (sitting and/or standing)      OT Treatment/Interventions: Self-care/ADL training;Patient/family education;Balance training;Therapeutic activities;DME and/or AE instruction;Therapeutic exercise    OT Goals(Current goals can be found in the care plan section) Acute Rehab OT Goals Patient Stated Goal: Pt did not state during session OT Goal Formulation: With patient/family Time For Goal Achievement: 12/29/22 Potential to Achieve Goals: Good  OT Frequency: Min  2X/week       AM-PAC OT "6 Clicks"  Daily Activity     Outcome Measure Help from another person eating meals?: A Little Help from another person taking care of personal grooming?: A Lot Help from another person toileting, which includes using toliet, bedpan, or urinal?: Total Help from another person bathing (including washing, rinsing, drying)?: Total Help from another person to put on and taking off regular upper body clothing?: A Lot Help from another person to put on and taking off regular lower body clothing?: Total 6 Click Score: 10   End of Session Equipment Utilized During Treatment: Gait belt Nurse Communication: Mobility status  Activity Tolerance: Patient limited by fatigue Patient left: in bed;with call bell/phone within reach;with chair alarm set  OT Visit Diagnosis: Unsteadiness on feet (R26.81);Muscle weakness (generalized) (M62.81);Other symptoms and signs involving cognitive function;Pain Pain - Right/Left: Right Pain - part of body: Hip                Time: 1132-1202 OT Time Calculation (min): 30 min Charges:  OT General Charges $OT Visit: 1 Visit OT Evaluation $OT Eval Moderate Complexity: 1 Mod OT Treatments $Self Care/Home Management : 8-22 mins  Clyda Greener, OTR/L [place]  Office [phone] 12/15/2022

## 2022-12-16 DIAGNOSIS — I25119 Atherosclerotic heart disease of native coronary artery with unspecified angina pectoris: Secondary | ICD-10-CM | POA: Diagnosis not present

## 2022-12-16 DIAGNOSIS — H44513 Absolute glaucoma, bilateral: Secondary | ICD-10-CM | POA: Diagnosis not present

## 2022-12-16 DIAGNOSIS — I5042 Chronic combined systolic (congestive) and diastolic (congestive) heart failure: Secondary | ICD-10-CM | POA: Diagnosis not present

## 2022-12-16 DIAGNOSIS — Z992 Dependence on renal dialysis: Secondary | ICD-10-CM | POA: Diagnosis not present

## 2022-12-16 DIAGNOSIS — I10 Essential (primary) hypertension: Secondary | ICD-10-CM | POA: Diagnosis not present

## 2022-12-16 DIAGNOSIS — N186 End stage renal disease: Secondary | ICD-10-CM | POA: Diagnosis not present

## 2022-12-16 DIAGNOSIS — E785 Hyperlipidemia, unspecified: Secondary | ICD-10-CM | POA: Diagnosis not present

## 2022-12-16 DIAGNOSIS — K219 Gastro-esophageal reflux disease without esophagitis: Secondary | ICD-10-CM | POA: Diagnosis not present

## 2022-12-16 DIAGNOSIS — S92151D Displaced avulsion fracture (chip fracture) of right talus, subsequent encounter for fracture with routine healing: Secondary | ICD-10-CM | POA: Diagnosis not present

## 2022-12-16 NOTE — ED Provider Notes (Signed)
Patient accepted to [facility].  Patient amenable to discharge.   Carmin Muskrat, MD 12/16/22 [phone]

## 2022-12-16 NOTE — Progress Notes (Signed)
CSW spoke with Epifania Gore, centralized intake with [facility]. Patients insurance authorization is approved. Number for report is [id number]. Patient will discharge today.

## 2022-12-16 NOTE — ED Notes (Signed)
Pt transported via [facility] to [facility]

## 2022-12-16 NOTE — Progress Notes (Signed)
Patients daughter, Cheick Vert was contacted in regards to patients discharge to [facility]

## 2022-12-16 NOTE — ED Notes (Signed)
[facility] called for transport --[place] today.

## 2022-12-16 NOTE — ED Notes (Signed)
Report given to nursing home pt stable for transfer

## 2022-12-27 NOTE — Progress Notes (Deleted)
Office Visit    Patient Name: Travis [name] Date of Encounter: 12/27/2022  Primary Care Provider:  [facility] Primary Cardiologist:  Meriam Sprague, MD Primary Electrophysiologist: None  Chief Complaint    Travis [name] is a 82 y.o. male with PMH of ESRD on HD, pulmonary fibrosis, Sjogren's disease, HFpEF, GERD, HTN, DVT s/p IVC filter 2016, COPD, anemia who presents today for 3 month follow up.  Past Medical History    Past Medical History:  Diagnosis Date   Anemia    Arthritis    Chronic combined systolic and diastolic CHF (congestive heart failure) (HCC)    a. Etiology of low EF not defined.   DVT (deep venous thrombosis) (HCC)    a. 01/2015: RLE DVT. VQ scan intermediate probability. Underwent renal bx complicated by perinephric hematoma; anticoagulation stopped and IVC filter placed.   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 Received plasma exchange and steroids but didn't respond and ended up starting hemodialysis June 2016.    Essential hypertension    GERD (gastroesophageal reflux disease)    History of nuclear stress test    Myoview 9/19 [facility]): low risk    HTN (hypertension)    Hypoalbuminemia    Hypoglycemia    NSTEMI (non-ST elevated myocardial infarction) (HCC) 04/18/2015   OA (osteoarthritis) of knee    Perinephric hematoma 01/2015   Proctitis 07/2015   Protein calorie malnutrition (HCC)    Pulmonary fibrosis (HCC)    Sjogren's disease (HCC) 01/28/2015   Past Surgical History:  Procedure Laterality Date   AV FISTULA PLACEMENT Right 02/17/2015   Procedure: INSERTION OF RIGHT ARM  ARTERIOVENOUS (AV) GORE-TEX GRAFT ;  Surgeon: Sherren Kerns, MD;  Location: [facility] OR;  Service: Vascular;  Laterality: Right;   FLEXIBLE SIGMOIDOSCOPY N/A 08/04/2015   Procedure: FLEXIBLE SIGMOIDOSCOPY;  Surgeon: Charlott Rakes, MD;  Location: [facility] ENDOSCOPY;   Service: Endoscopy;  Laterality: N/A;   HEMORROIDECTOMY  1999   INSERTION OF DIALYSIS CATHETER N/A 02/17/2015   Procedure: INSERTION OF DIALYSIS CATHETER RIGHT INTERNAL JUGULAR VEIN;  Surgeon: Sherren Kerns, MD;  Location: [facility] OR;  Service: Vascular;  Laterality: N/A;   MULTIPLE TOOTH EXTRACTIONS     PERIPHERAL VASCULAR BALLOON ANGIOPLASTY Right 01/23/2019   Procedure: PERIPHERAL VASCULAR BALLOON ANGIOPLASTY;  Surgeon: Sherren Kerns, MD;  Location: [facility] INVASIVE CV LAB;  Service: Cardiovascular;  Laterality: Right;   REVISION OF ARTERIOVENOUS GORETEX GRAFT Right 03/29/2017   Procedure: REVISION OF ARTERIOVENOUS GORETEX GRAFT;  Surgeon: Fransisco Hertz, MD;  Location: [facility] OR;  Service: Vascular;  Laterality: Right;   RIGHT/LEFT HEART CATH AND CORONARY ANGIOGRAPHY N/A 07/06/2022   Procedure: RIGHT/LEFT HEART CATH AND CORONARY ANGIOGRAPHY;  Surgeon: Marykay Lex, MD;  Location: [facility] INVASIVE CV LAB;  Service: Cardiovascular;  Laterality: N/A;   Surgical procedure to remove a mole as a child Right Eye area   At around 43 years old   TOTAL HIP ARTHROPLASTY Right 12/06/2022   Procedure: TOTAL HIP ARTHROPLASTY ANTERIOR APPROACH;  Surgeon: Samson Frederic, MD;  Location: [facility] OR;  Service: Orthopedics;  Laterality: Right;    Allergies  No Known Allergies  History of Present Illness    Travis [name] is a 82 year old male with the above-mentioned past medical history who presents today for follow-up of CHF and HTN.  [name] was initially seen by Dr. Delton See for acute shortness of breath following dialysis  treatment.  Troponins were flat and EF was 35 to 40% with no RWMA, with mild MR.  He was seen in the ED on 04/2015 for syncope and hypotension.  He was then readmitted 07/2015 for bronchitis/pneumonia.  He was treated for sepsis and found to have pericardial effusion without tamponade.  He was treated with colchicine and follow-up TEE showed interval change.  He was being worked up for kidney  transplant at the end of 2017.   He was admitted in 2017 for complaint of chest pain for complaint of chest pain.  Serial high-sensitivity troponins 24>>65.  His chest pain resolved after 3 baby aspirin and patient was was discharged with plan for stress test work-up in outpatient setting.  Stress test was completed at [facility] that showed normal perfusion and EF of 59.  2D echo was completed 11/2020 that also revealed EF 60-65%, no RWMA, mild LH grade 2 DD mild LAE, trivial MR, mild to moderate TR, mild AI, mild AAS (mean 12 mmHg. [name] was admitted on 12/18/2021 for hypertensive emergency and hypertensive encephalopathy.  His systolic blood pressure was in the 230s and head CT was negative with no acute findings.  He was discharged once blood pressure was controlled and directed to take home BP medications as prescribed.  He was last seen by Dr. Shari Prows on 09/28/2022 following admission for chest pain with elevated troponin. LHC/RHC 07/06/22 showed 99% D1, 60% D2, 95% D3 with medical management recommended. TTE 07/06/22 with LVEF 45%, G2DD, normal RV, mild MR, mild AR, mild AS.  He recently went underwent total hip arthroplasty 12/06/2022.     Since last being seen in the office patient reports***.  Patient denies chest pain, palpitations, dyspnea, PND, orthopnea, nausea, vomiting, dizziness, syncope, edema, weight gain, or early satiety.     ***Notes:  Home Medications    Current Outpatient Medications  Medication Sig Dispense Refill   Accu-Chek Softclix Lancets lancets Use to test blood glucose four times daily as directed. 100 each 1   albuterol (VENTOLIN HFA) 108 (90 Base) MCG/ACT inhaler Inhale 2 puffs into the lungs every 6 (six) hours as needed for wheezing or shortness of breath.     apixaban (ELIQUIS) 2.5 MG TABS tablet Take 1 tablet (2.5 mg total) by mouth 2 (two) times daily. 60 tablet 0   atorvastatin (LIPITOR) 40 MG tablet Take 1 tablet (40 mg total) by mouth daily at 6 PM.  (Patient not taking: Reported on 12/12/2022) 30 tablet 0   Blood Glucose Monitoring Suppl (BLOOD GLUCOSE MONITOR SYSTEM) w/Device KIT Use up to four times daily as directed. 1 kit 1   Brinzolamide-Brimonidine 1-0.2 % SUSP Place 1 drop into both eyes in the morning and at bedtime.     calcitRIOL (ROCALTROL) 0.25 MCG capsule Take 13 capsules (3.25 mcg total) by mouth every Monday, Wednesday, and Friday with hemodialysis.     carvedilol (COREG) 12.5 MG tablet Take 1 tablet (12.5 mg total) by mouth 2 (two) times daily with a meal. 180 tablet 2   cinacalcet (SENSIPAR) 30 MG tablet Take 3 tablets (90 mg total) by mouth every Monday, Wednesday, and Friday with hemodialysis. 60 tablet    ferric gluconate 62.5 mg in sodium chloride 0.9 % 100 mL Inject 62.5 mg into the vein every Wednesday with hemodialysis.     gabapentin (NEURONTIN) 300 MG capsule Take 300 mg by mouth daily.     glucose blood test strip Use to test blood glucose four times daily as directed  100 each 1   HYDROcodone-acetaminophen (NORCO/VICODIN) 5-325 MG tablet Take 1 tablet by mouth every 4 (four) hours as needed for moderate pain or severe pain. 40 tablet 0   latanoprost (XALATAN) 0.005 % ophthalmic solution Place 1 drop into both eyes at bedtime.     midodrine (PROAMATINE) 10 MG tablet Take 1 tablet (10 mg total) by mouth every Monday, Wednesday, and Friday. Take 10 mg (1 tablet) by mouth before dialysis and 10 mg (1 tablet) at dialysis as directed. 45 tablet 1   multivitamin (RENA-VIT) TABS tablet Take 1 tablet by mouth at bedtime. 90 tablet 0   nitroGLYCERIN (NITROSTAT) 0.4 MG SL tablet Place 1 tablet (0.4 mg total) under the tongue every 5 (five) minutes as needed for chest pain (for total of 3 doses at the most). 10 tablet 1   Nutritional Supplements (FEEDING SUPPLEMENT, NEPRO CARB STEADY,) LIQD Take 237 mLs by mouth 2 (two) times daily between meals. 14220 mL 0   omeprazole (PRILOSEC) 40 MG capsule Take 40 mg by mouth daily. (Patient  not taking: Reported on 12/12/2022)  11   polyethylene glycol powder (MIRALAX) 17 GM/SCOOP powder Take 17 g by mouth daily. Hold for diarrhea (watery stools). 255 g 0   sevelamer carbonate (RENVELA) 800 MG tablet Take 2 tablets (1,600 mg total) by mouth 3 (three) times daily with meals. (Patient taking differently: Take 1,800 mg by mouth 3 (three) times daily with meals.) 90 tablet 0   timolol (TIMOPTIC-XR) 0.5 % ophthalmic gel-forming Place 1 drop into both eyes daily.     No current facility-administered medications for this visit.     Review of Systems  Please see the history of present illness.    (+)*** (+)***  All other systems reviewed and are otherwise negative except as noted above.  Physical Exam    Wt Readings from Last 3 Encounters:  12/15/22 146 lb 13.2 oz (66.6 kg)  12/08/22 153 lb 3.5 oz (69.5 kg)  09/28/22 156 lb 6.4 oz (70.9 kg)   ST:MHDQQ were no vitals filed for this visit.,There is no height or weight on file to calculate BMI.  Constitutional:      Appearance: Healthy appearance. Not in distress.  Neck:     Vascular: JVD normal.  Pulmonary:     Effort: Pulmonary effort is normal.     Breath sounds: No wheezing. No rales. Diminished in the bases Cardiovascular:     Normal rate. Regular rhythm. Normal S1. Normal S2.      Murmurs: There is no murmur.  Edema:    Peripheral edema absent.  Abdominal:     Palpations: Abdomen is soft non tender. There is no hepatomegaly.  Skin:    General: Skin is warm and dry.  Neurological:     General: No focal deficit present.     Mental Status: Alert and oriented to person, place and time.     Cranial Nerves: Cranial nerves are intact.  EKG/LABS/ Recent Cardiac Studies    ECG personally reviewed by me today - ***  Risk Assessment/Calculations:   {Does this patient have ATRIAL FIBRILLATION?[phone]}        Lab Results  Component Value Date   WBC 15.0 (H) 12/15/2022   HGB 8.2 (L) 12/15/2022   HCT 26.2 (L)  12/15/2022   MCV 95.6 12/15/2022   PLT 275 12/15/2022   Lab Results  Component Value Date   CREATININE 12.02 (H) 12/15/2022   BUN 71 (H) 12/15/2022   NA 128 (L) 12/15/2022  K 5.2 (H) 12/15/2022   CL 88 (L) 12/15/2022   CO2 25 12/15/2022   Lab Results  Component Value Date   ALT 7 09/20/2022   AST 16 09/20/2022   ALKPHOS 67 09/20/2022   BILITOT 0.8 09/20/2022   Lab Results  Component Value Date   CHOL 153 07/15/2022   HDL 55 07/15/2022   LDLCALC 77 07/15/2022   TRIG 115 07/15/2022   CHOLHDL 2.8 07/15/2022    Lab Results  Component Value Date   HGBA1C 4.7 (L) 05/21/2021    Cardiac Studies & Procedures   CARDIAC CATHETERIZATION  CARDIAC CATHETERIZATION 07/06/2022  Narrative   Heavily calcified diffusely diseased vessel: 1st Diag-1 lesion is 99% stenosed. 1st Diag-2 lesion is 60% stenosed. 1st Diag-3 lesion is 95% stenosed.   The left ventricular systolic function is normal. The left ventricular ejection fraction is 50-55% by visual estimate.   LV end diastolic pressure is moderately elevated.   Hemodynamic findings consistent with mild pulmonary hypertension.  POSTOP DIAGNOSES Moderate-severe 2-vessel single-vessel disease: Proximal D1-99%, mid 60%, distal 95% (heavily calcified/diffusely diseased; not favorable for PCI would require atherectomy and a relatively small caliber vessel) & 60% ostial sidebranch of OM1 followed by a 60% mid vessel then distal occlusion (with right to left collaterals). Otherwise mild diffuse disease/calcified vessels Mostly preserved LVEF, cannot exclude anterolateral hypokinesis. LVEDP moderately elevated at 19 mmHg (LVEDP 166/5 mmHg) with a PCWP of ~22 mmHg. RHC numbers: Mean RAP 6 mmHg, RVP-EDP 51/0-5 mmHg; PAP-mean 50/13-27 mmHg; PCWP 17-26 mmHg (~22 mmHg); Ao sat 99%, PA sat 74%. CO-CI: Fick -> 8.22, 4.44; thermal 5.37-2.7 (suspect that the discrepancy is related to AV fistula)   RECOMMENDATIONS Would recommend medical  management for heavily diseased D1 branch. This is heavily calcified vessel with diffuse disease, will require atherectomy and extensive stent placement in the vessel is roughly 2 to 2.25 mm distally.  Not favorable for PCI. Consider slightly increased volume removal and HD based on elevated filling pressures. Return to nursing for ongoing care    Bryan Lemma, MD  Findings Coronary Findings Diagnostic  Dominance: Co-dominant  Left Anterior Descending Vessel is normal in caliber. There is mild diffuse disease throughout the vessel. The vessel is mildly calcified.  First Diagonal Branch Vessel is small in size. There is moderate  diffuse disease in the vessel. Diffuse calcification 1st Diag-1 lesion is 99% stenosed. Vessel is the culprit lesion. The lesion is eccentric. The lesion is severely calcified. 1st Diag-2 lesion is 60% stenosed. The lesion is concentric. The lesion is moderately calcified. 1st Diag-3 lesion is 95% stenosed. The lesion is segmental and concentric. The lesion is moderately calcified.  Lateral First Diagonal Branch Vessel is small in size.  Second Diagonal Branch Vessel is small in size.  Left Circumflex Vessel is normal in caliber and large. The vessel exhibits minimal luminal irregularities.  First Obtuse Marginal Branch Vessel is large in size. The vessel exhibits minimal luminal irregularities.  Lateral First Obtuse Marginal Branch Vessel is small in size.  Second Obtuse Marginal Branch Vessel is small in size.  First Left Posterolateral Branch Vessel is small in size.  Second Left Posterolateral Branch Vessel is small in size.  Third Left Posterolateral Branch Vessel is small in size.  Left Posterior Atrioventricular Artery Vessel is large in size.  Right Coronary Artery Vessel was injected. Vessel is small. There is mild diffuse disease throughout the vessel. The vessel is mildly calcified.  Acute Marginal Branch Vessel is small in  size.  Right Ventricular Branch  Right Posterior Descending Artery Vessel is small in size.  First Right Posterolateral Branch Vessel is moderate in size.  Intervention  No interventions have been documented.   STRESS TESTS  MYOCARDIAL PERFUSION IMAGING 01/12/2021   ECHOCARDIOGRAM  ECHOCARDIOGRAM COMPLETE 07/06/2022  Narrative ECHOCARDIOGRAM REPORT    Patient Name:   OLUWATIMILEYIN VIVIER Date of Exam: 07/06/2022 Medical Rec #:  [id number]       Height:       70.0 in Accession #:    [id number]      Weight:       154.3 lb Date of Birth:  [birth year]       BSA:          1.870 m Patient Age:    81 years        BP:           147/76 mmHg Patient Gender: M               HR:           66 bpm. Exam Location:  Inpatient  Procedure: 2D Echo, 3D Echo, Cardiac Doppler and Color Doppler  Indications:     NSTEMI I21.4  History:         Patient has prior history of Echocardiogram examinations, most recent 11/11/2020. CHF, NSTEMI, Signs/Symptoms:Chest Pain; Risk Factors:Hypertension and Non-Smoker.  Sonographer:     Aron Baba Referring Phys:  [id number] Emeline General Diagnosing Phys: Lennie Odor MD   Sonographer Comments: Suboptimal subcostal window. Image acquisition challenging due to respiratory motion. IMPRESSIONS   1. Left ventricular ejection fraction, by estimation, is 45 to 50%. Left ventricular ejection fraction by 2D MOD biplane is 47.4 %. The left ventricle has mildly decreased function. The left ventricle has no regional wall motion abnormalities. There is moderate asymmetric left ventricular hypertrophy of the infero-lateral segment. Left ventricular diastolic parameters are consistent with Grade II diastolic dysfunction (pseudonormalization). 2. Right ventricular systolic function is normal. The right ventricular size is normal. Tricuspid regurgitation signal is inadequate for assessing PA pressure. 3. The mitral valve is grossly normal. Mild mitral valve regurgitation. No  evidence of mitral stenosis. 4. The aortic valve is tricuspid. There is moderate calcification of the aortic valve. There is moderate thickening of the aortic valve. Aortic valve regurgitation is mild. Mild aortic valve stenosis. 5. The inferior vena cava is normal in size with greater than 50% respiratory variability, suggesting right atrial pressure of 3 mmHg.  Comparison(s): Changes from prior study are noted. The left ventricular function is worsened.  FINDINGS Left Ventricle: Left ventricular ejection fraction, by estimation, is 45 to 50%. Left ventricular ejection fraction by 2D MOD biplane is 47.4 %. The left ventricle has mildly decreased function. The left ventricle has no regional wall motion abnormalities. 3D left ventricular ejection fraction analysis performed but not reported based on interpreter judgement due to suboptimal tracking. The left ventricular internal cavity size was normal in size. There is moderate asymmetric left ventricular hypertrophy of the infero-lateral segment. Left ventricular diastolic parameters are consistent with Grade II diastolic dysfunction (pseudonormalization).  Right Ventricle: The right ventricular size is normal. No increase in right ventricular wall thickness. Right ventricular systolic function is normal. Tricuspid regurgitation signal is inadequate for assessing PA pressure.  Left Atrium: Left atrial size was normal in size.  Right Atrium: Right atrial size was normal in size.  Pericardium: There is no evidence of pericardial effusion.  Mitral Valve: The mitral valve is grossly normal.  Mild mitral valve regurgitation. No evidence of mitral valve stenosis.  Tricuspid Valve: The tricuspid valve is grossly normal. Tricuspid valve regurgitation is mild . No evidence of tricuspid stenosis.  Aortic Valve: The aortic valve is tricuspid. There is moderate calcification of the aortic valve. There is moderate thickening of the aortic valve. Aortic valve  regurgitation is mild. Aortic regurgitation PHT measures 460 msec. Mild aortic stenosis is present. Aortic valve mean gradient measures 15.0 mmHg. Aortic valve peak gradient measures 27.2 mmHg. Aortic valve area, by VTI measures 1.11 cm.  Pulmonic Valve: The pulmonic valve was grossly normal. Pulmonic valve regurgitation is trivial. No evidence of pulmonic stenosis.  Aorta: The aortic root and ascending aorta are structurally normal, with no evidence of dilitation.  Venous: The inferior vena cava is normal in size with greater than 50% respiratory variability, suggesting right atrial pressure of 3 mmHg.  IAS/Shunts: The atrial septum is grossly normal.   LEFT VENTRICLE PLAX 2D                        Biplane EF (MOD) LVIDd:         5.10 cm         LV Biplane EF:   Left LVIDs:         3.90 cm                          ventricular LV PW:         1.80 cm                          ejection LV IVS:        0.60 cm                          fraction by LVOT diam:     1.80 cm                          2D MOD LV SV:         61                               biplane is LV SV Index:   33                               47.4 %. LVOT Area:     2.54 cm Diastology LV e' medial:    4.57 cm/s LV Volumes (MOD)               LV E/e' medial:  26.9 LV vol d, MOD    121.0 ml      LV e' lateral:   5.98 cm/s A2C:                           LV E/e' lateral: 20.6 LV vol d, MOD    99.0 ml A4C: LV vol s, MOD    64.6 ml A2C: LV vol s, MOD    53.5 ml       3D Volume EF: A4C:  3D EF:        53 % LV SV MOD A2C:   56.4 ml       LV EDV:       207 ml LV SV MOD A4C:   99.0 ml       LV ESV:       97 ml LV SV MOD BP:    52.7 ml       LV SV:        110 ml  RIGHT VENTRICLE RV S prime:     8.92 cm/s TAPSE (M-mode): 2.0 cm  LEFT ATRIUM             Index        RIGHT ATRIUM           Index LA diam:        3.60 cm 1.93 cm/m   RA Area:     17.50 cm LA Vol (A2C):   87.0 ml 46.54 ml/m  RA Volume:    46.90 ml  25.09 ml/m LA Vol (A4C):   32.3 ml 17.28 ml/m LA Biplane Vol: 52.7 ml 28.19 ml/m AORTIC VALVE                     PULMONIC VALVE AV Area (Vmax):    1.13 cm      PR End Diast Vel: 5.02 msec AV Area (Vmean):   1.16 cm AV Area (VTI):     1.11 cm AV Vmax:           261.00 cm/s AV Vmean:          182.500 cm/s AV VTI:            0.546 m AV Peak Grad:      27.2 mmHg AV Mean Grad:      15.0 mmHg LVOT Vmax:         116.00 cm/s LVOT Vmean:        83.300 cm/s LVOT VTI:          0.239 m LVOT/AV VTI ratio: 0.44 AI PHT:            460 msec  AORTA Ao Root diam: 3.90 cm Ao Asc diam:  3.70 cm  MITRAL VALVE MV Area (PHT): 3.60 cm     SHUNTS MV Decel Time: 211 msec     Systemic VTI:  0.24 m MV E velocity: 123.00 cm/s  Systemic Diam: 1.80 cm MV A velocity: 75.00 cm/s MV E/A ratio:  1.64  Lennie Odor MD Electronically signed by Lennie Odor MD Signature Date/Time: 07/06/2022/6:51:56 PM    Final (Updated)             Assessment & Plan     1.  HFpEF/ Aortic Valve stenosis:  -Last echo was completed 11/2020 in our system normal LVEF 60-65%, grade 2 diastolic dysfunction    -Volume is managed by dialysis   2.  Coronary artery disease: -Current GDMT consist of ASA 81 mg, Lipitor 40 mg, carvedilol six-point6.25 mg twice daily - 3.  Essential hypertension: -Patient's blood pressure today was***   4.  ESRD on dialysis: -Patient currently on M, W, F schedule -Continue per nephrology     5.  Hyperlipidemia -Patient's last LDL was 87 still slightly elevated from goal of less than 70 -      Disposition: Follow-up with Meriam Sprague, MD or APP in *** months {Are you ordering a CV Procedure (e.g. stress test, cath, DCCV, TEE,  etc)?   Press F2        :[id number]   Medication Adjustments/Labs and Tests Ordered: Current medicines are reviewed at length with the patient today.  Concerns regarding medicines are outlined above.   Signed, Napoleon Form, Leodis Rains,  NP 12/27/2022, 8:28 AM [facility]  Note:  This document was prepared using Dragon voice recognition software and may include unintentional dictation errors.

## 2022-12-28 ENCOUNTER — Ambulatory Visit: Payer: Medicare Other | Attending: Cardiology

## 2022-12-28 ENCOUNTER — Ambulatory Visit: Payer: Medicare Other | Admitting: Nurse Practitioner

## 2022-12-28 DIAGNOSIS — E785 Hyperlipidemia, unspecified: Secondary | ICD-10-CM

## 2022-12-28 DIAGNOSIS — I10 Essential (primary) hypertension: Secondary | ICD-10-CM

## 2022-12-28 DIAGNOSIS — Z992 Dependence on renal dialysis: Secondary | ICD-10-CM

## 2022-12-28 DIAGNOSIS — I5042 Chronic combined systolic (congestive) and diastolic (congestive) heart failure: Secondary | ICD-10-CM

## 2022-12-28 DIAGNOSIS — I2510 Atherosclerotic heart disease of native coronary artery without angina pectoris: Secondary | ICD-10-CM

## 2023-01-11 ENCOUNTER — Ambulatory Visit (INDEPENDENT_AMBULATORY_CARE_PROVIDER_SITE_OTHER): Payer: Non-veteran care | Admitting: Podiatry

## 2023-01-11 ENCOUNTER — Encounter: Payer: Self-pay | Admitting: Podiatry

## 2023-01-11 DIAGNOSIS — I739 Peripheral vascular disease, unspecified: Secondary | ICD-10-CM

## 2023-01-11 DIAGNOSIS — M79675 Pain in left toe(s): Secondary | ICD-10-CM

## 2023-01-11 DIAGNOSIS — N186 End stage renal disease: Secondary | ICD-10-CM

## 2023-01-11 DIAGNOSIS — B351 Tinea unguium: Secondary | ICD-10-CM | POA: Diagnosis not present

## 2023-01-11 DIAGNOSIS — M79674 Pain in right toe(s): Secondary | ICD-10-CM | POA: Diagnosis not present

## 2023-01-11 DIAGNOSIS — Z992 Dependence on renal dialysis: Secondary | ICD-10-CM

## 2023-01-13 NOTE — Progress Notes (Signed)
  Subjective:  Patient ID: Travis [name], male    DOB: [birth year],  MRN: [id number]  Travis [name] presents to clinic today for:  Chief Complaint  Patient presents with   Nail Problem. Patient has PAD and ESRD on hemodialysis on MWF    [facility] PCP-[place] PCP VST-Early 2024  .  PCP is [facility]  He is accompanied by his caregiver on today's visit.  No Known Allergies  Review of Systems: Negative except as noted in the HPI.  Objective: No changes noted in today's physical examination. There were no vitals filed for this visit.  Travis [name] is a pleasant 82 y.o. male, frail, in NAD. AAO x 3.  Vascular Examination: CFT <4 seconds b/l. DP pulses diminished b/l. PT pulses diminished b/l. Digital hair absent. Skin temperature gradient warm to cool b/l. No ischemia or gangrene. No cyanosis or clubbing noted b/l.    Neurological Examination: Sensation grossly intact b/l with 10 gram monofilament. Vibratory sensation intact b/l.   Dermatological Examination: Pedal skin thin, shiny and atrophic b/l. No open wounds. No interdigital macerations.   Toenails 1-5 b/l thick, discolored, elongated with subungual debris and pain on dorsal palpation.   No hyperkeratotic nor porokeratotic lesions present on today's visit.  Musculoskeletal Examination: Muscle strength 4/5 to all lower extremity muscle groups bilaterally. Utilizes transport chair for mobility assistance. Hallux valgus with bunion deformity noted b/l lower extremities.  Radiographs: None  Assessment/Plan: 1. Pain due to onychomycosis of toenails of both feet   2. PAD (peripheral artery disease) (HCC)   3. End stage renal disease on dialysis [facility])     -Consent given for treatment as described below: -Examined patient. -Continue supportive shoe gear daily. -Toenails 1-5 b/l were debrided in length and girth with sterile nail nippers and dremel without iatrogenic bleeding.  -Patient/POA to call should  there be question/concern in the interim.   Return in about 3 months (around 04/12/2023).  Freddie Breech, DPM

## 2023-01-14 ENCOUNTER — Encounter (HOSPITAL_COMMUNITY): Payer: Self-pay

## 2023-01-14 ENCOUNTER — Other Ambulatory Visit: Payer: Self-pay

## 2023-01-14 ENCOUNTER — Emergency Department (HOSPITAL_COMMUNITY): Payer: No Typology Code available for payment source

## 2023-01-14 DIAGNOSIS — E8770 Fluid overload, unspecified: Secondary | ICD-10-CM

## 2023-01-14 DIAGNOSIS — Z781 Physical restraint status: Secondary | ICD-10-CM

## 2023-01-14 DIAGNOSIS — Z86718 Personal history of other venous thrombosis and embolism: Secondary | ICD-10-CM

## 2023-01-14 DIAGNOSIS — I5021 Acute systolic (congestive) heart failure: Secondary | ICD-10-CM | POA: Diagnosis not present

## 2023-01-14 DIAGNOSIS — Z992 Dependence on renal dialysis: Secondary | ICD-10-CM | POA: Diagnosis not present

## 2023-01-14 DIAGNOSIS — K219 Gastro-esophageal reflux disease without esophagitis: Secondary | ICD-10-CM | POA: Diagnosis present

## 2023-01-14 DIAGNOSIS — N2581 Secondary hyperparathyroidism of renal origin: Secondary | ICD-10-CM | POA: Diagnosis present

## 2023-01-14 DIAGNOSIS — Z7189 Other specified counseling: Secondary | ICD-10-CM | POA: Diagnosis not present

## 2023-01-14 DIAGNOSIS — M3509 Sicca syndrome with other organ involvement: Secondary | ICD-10-CM | POA: Diagnosis not present

## 2023-01-14 DIAGNOSIS — M3500 Sicca syndrome, unspecified: Secondary | ICD-10-CM | POA: Diagnosis present

## 2023-01-14 DIAGNOSIS — E785 Hyperlipidemia, unspecified: Secondary | ICD-10-CM | POA: Diagnosis present

## 2023-01-14 DIAGNOSIS — D631 Anemia in chronic kidney disease: Secondary | ICD-10-CM | POA: Diagnosis present

## 2023-01-14 DIAGNOSIS — Z66 Do not resuscitate: Secondary | ICD-10-CM | POA: Diagnosis not present

## 2023-01-14 DIAGNOSIS — I132 Hypertensive heart and chronic kidney disease with heart failure and with stage 5 chronic kidney disease, or end stage renal disease: Secondary | ICD-10-CM | POA: Diagnosis present

## 2023-01-14 DIAGNOSIS — I350 Nonrheumatic aortic (valve) stenosis: Secondary | ICD-10-CM

## 2023-01-14 DIAGNOSIS — K922 Gastrointestinal hemorrhage, unspecified: Secondary | ICD-10-CM | POA: Diagnosis not present

## 2023-01-14 DIAGNOSIS — J189 Pneumonia, unspecified organism: Secondary | ICD-10-CM | POA: Diagnosis present

## 2023-01-14 DIAGNOSIS — Z4802 Encounter for removal of sutures: Secondary | ICD-10-CM | POA: Diagnosis not present

## 2023-01-14 DIAGNOSIS — I10 Essential (primary) hypertension: Secondary | ICD-10-CM | POA: Diagnosis present

## 2023-01-14 DIAGNOSIS — I5043 Acute on chronic combined systolic (congestive) and diastolic (congestive) heart failure: Secondary | ICD-10-CM | POA: Diagnosis present

## 2023-01-14 DIAGNOSIS — F0392 Unspecified dementia, unspecified severity, with psychotic disturbance: Secondary | ICD-10-CM | POA: Diagnosis present

## 2023-01-14 DIAGNOSIS — D62 Acute posthemorrhagic anemia: Secondary | ICD-10-CM | POA: Diagnosis not present

## 2023-01-14 DIAGNOSIS — R627 Adult failure to thrive: Secondary | ICD-10-CM | POA: Diagnosis present

## 2023-01-14 DIAGNOSIS — K5731 Diverticulosis of large intestine without perforation or abscess with bleeding: Secondary | ICD-10-CM | POA: Diagnosis present

## 2023-01-14 DIAGNOSIS — I214 Non-ST elevation (NSTEMI) myocardial infarction: Secondary | ICD-10-CM | POA: Diagnosis present

## 2023-01-14 DIAGNOSIS — Z95828 Presence of other vascular implants and grafts: Secondary | ICD-10-CM | POA: Diagnosis not present

## 2023-01-14 DIAGNOSIS — T45515A Adverse effect of anticoagulants, initial encounter: Secondary | ICD-10-CM | POA: Diagnosis not present

## 2023-01-14 DIAGNOSIS — R6520 Severe sepsis without septic shock: Secondary | ICD-10-CM | POA: Diagnosis not present

## 2023-01-14 DIAGNOSIS — R197 Diarrhea, unspecified: Secondary | ICD-10-CM | POA: Diagnosis not present

## 2023-01-14 DIAGNOSIS — R7401 Elevation of levels of liver transaminase levels: Secondary | ICD-10-CM | POA: Diagnosis not present

## 2023-01-14 DIAGNOSIS — R64 Cachexia: Secondary | ICD-10-CM | POA: Diagnosis present

## 2023-01-14 DIAGNOSIS — Z7901 Long term (current) use of anticoagulants: Secondary | ICD-10-CM

## 2023-01-14 DIAGNOSIS — Z7982 Long term (current) use of aspirin: Secondary | ICD-10-CM

## 2023-01-14 DIAGNOSIS — E872 Acidosis, unspecified: Secondary | ICD-10-CM

## 2023-01-14 DIAGNOSIS — Z515 Encounter for palliative care: Secondary | ICD-10-CM

## 2023-01-14 DIAGNOSIS — A419 Sepsis, unspecified organism: Secondary | ICD-10-CM | POA: Diagnosis not present

## 2023-01-14 DIAGNOSIS — I2583 Coronary atherosclerosis due to lipid rich plaque: Secondary | ICD-10-CM | POA: Diagnosis not present

## 2023-01-14 DIAGNOSIS — J9621 Acute and chronic respiratory failure with hypoxia: Secondary | ICD-10-CM | POA: Diagnosis not present

## 2023-01-14 DIAGNOSIS — R0689 Other abnormalities of breathing: Secondary | ICD-10-CM | POA: Diagnosis not present

## 2023-01-14 DIAGNOSIS — I509 Heart failure, unspecified: Secondary | ICD-10-CM

## 2023-01-14 DIAGNOSIS — K559 Vascular disorder of intestine, unspecified: Secondary | ICD-10-CM | POA: Diagnosis not present

## 2023-01-14 DIAGNOSIS — R579 Shock, unspecified: Secondary | ICD-10-CM

## 2023-01-14 DIAGNOSIS — R6521 Severe sepsis with septic shock: Secondary | ICD-10-CM | POA: Diagnosis not present

## 2023-01-14 DIAGNOSIS — Z96641 Presence of right artificial hip joint: Secondary | ICD-10-CM | POA: Diagnosis present

## 2023-01-14 DIAGNOSIS — Z79899 Other long term (current) drug therapy: Secondary | ICD-10-CM

## 2023-01-14 DIAGNOSIS — Z8249 Family history of ischemic heart disease and other diseases of the circulatory system: Secondary | ICD-10-CM

## 2023-01-14 DIAGNOSIS — I2510 Atherosclerotic heart disease of native coronary artery without angina pectoris: Secondary | ICD-10-CM | POA: Diagnosis present

## 2023-01-14 DIAGNOSIS — F0394 Unspecified dementia, unspecified severity, with anxiety: Secondary | ICD-10-CM | POA: Diagnosis present

## 2023-01-14 DIAGNOSIS — K921 Melena: Secondary | ICD-10-CM | POA: Diagnosis not present

## 2023-01-14 DIAGNOSIS — E43 Unspecified severe protein-calorie malnutrition: Secondary | ICD-10-CM | POA: Diagnosis present

## 2023-01-14 DIAGNOSIS — F05 Delirium due to known physiological condition: Secondary | ICD-10-CM | POA: Diagnosis not present

## 2023-01-14 DIAGNOSIS — Z1152 Encounter for screening for COVID-19: Secondary | ICD-10-CM

## 2023-01-14 DIAGNOSIS — Z91158 Patient's noncompliance with renal dialysis for other reason: Secondary | ICD-10-CM

## 2023-01-14 DIAGNOSIS — G9341 Metabolic encephalopathy: Secondary | ICD-10-CM | POA: Diagnosis not present

## 2023-01-14 DIAGNOSIS — N186 End stage renal disease: Secondary | ICD-10-CM | POA: Diagnosis present

## 2023-01-14 DIAGNOSIS — I252 Old myocardial infarction: Secondary | ICD-10-CM

## 2023-01-14 DIAGNOSIS — Z681 Body mass index (BMI) 19 or less, adult: Secondary | ICD-10-CM

## 2023-01-14 DIAGNOSIS — D500 Iron deficiency anemia secondary to blood loss (chronic): Secondary | ICD-10-CM | POA: Diagnosis not present

## 2023-01-14 DIAGNOSIS — I953 Hypotension of hemodialysis: Secondary | ICD-10-CM | POA: Diagnosis not present

## 2023-01-14 DIAGNOSIS — R0602 Shortness of breath: Secondary | ICD-10-CM | POA: Diagnosis not present

## 2023-01-14 DIAGNOSIS — D6832 Hemorrhagic disorder due to extrinsic circulating anticoagulants: Secondary | ICD-10-CM | POA: Diagnosis not present

## 2023-01-14 DIAGNOSIS — J690 Pneumonitis due to inhalation of food and vomit: Secondary | ICD-10-CM | POA: Diagnosis present

## 2023-01-14 DIAGNOSIS — E871 Hypo-osmolality and hyponatremia: Secondary | ICD-10-CM | POA: Diagnosis not present

## 2023-01-14 DIAGNOSIS — K8020 Calculus of gallbladder without cholecystitis without obstruction: Secondary | ICD-10-CM | POA: Diagnosis present

## 2023-01-14 DIAGNOSIS — K625 Hemorrhage of anus and rectum: Secondary | ICD-10-CM | POA: Diagnosis not present

## 2023-01-14 DIAGNOSIS — G9340 Encephalopathy, unspecified: Secondary | ICD-10-CM

## 2023-01-14 DIAGNOSIS — I081 Rheumatic disorders of both mitral and tricuspid valves: Secondary | ICD-10-CM | POA: Diagnosis present

## 2023-01-14 DIAGNOSIS — R54 Age-related physical debility: Secondary | ICD-10-CM | POA: Diagnosis present

## 2023-01-14 DIAGNOSIS — J9601 Acute respiratory failure with hypoxia: Secondary | ICD-10-CM | POA: Diagnosis not present

## 2023-01-14 DIAGNOSIS — E162 Hypoglycemia, unspecified: Secondary | ICD-10-CM | POA: Diagnosis not present

## 2023-01-14 DIAGNOSIS — I255 Ischemic cardiomyopathy: Secondary | ICD-10-CM | POA: Diagnosis present

## 2023-01-14 DIAGNOSIS — E440 Moderate protein-calorie malnutrition: Secondary | ICD-10-CM | POA: Insufficient documentation

## 2023-01-14 DIAGNOSIS — M1990 Unspecified osteoarthritis, unspecified site: Secondary | ICD-10-CM | POA: Diagnosis present

## 2023-01-14 DIAGNOSIS — Z743 Need for continuous supervision: Secondary | ICD-10-CM | POA: Diagnosis not present

## 2023-01-14 DIAGNOSIS — R6889 Other general symptoms and signs: Secondary | ICD-10-CM | POA: Diagnosis not present

## 2023-01-14 DIAGNOSIS — J8410 Pulmonary fibrosis, unspecified: Secondary | ICD-10-CM | POA: Diagnosis present

## 2023-01-14 DIAGNOSIS — R1313 Dysphagia, pharyngeal phase: Secondary | ICD-10-CM | POA: Diagnosis present

## 2023-01-14 LAB — CBC WITH DIFFERENTIAL/PLATELET
Abs Immature Granulocytes: 0.19 10*3/uL — ABNORMAL HIGH (ref 0.00–0.07)
Basophils Absolute: 0.1 10*3/uL (ref 0.0–0.1)
Basophils Relative: 1 %
Eosinophils Absolute: 0.1 10*3/uL (ref 0.0–0.5)
Eosinophils Relative: 0 %
HCT: 26.2 % — ABNORMAL LOW (ref 39.0–52.0)
Hemoglobin: 8 g/dL — ABNORMAL LOW (ref 13.0–17.0)
Immature Granulocytes: 1 %
Lymphocytes Relative: 8 %
Lymphs Abs: 1.2 10*3/uL (ref 0.7–4.0)
MCH: 30.4 pg (ref 26.0–34.0)
MCHC: 30.5 g/dL (ref 30.0–36.0)
MCV: 99.6 fL (ref 80.0–100.0)
Monocytes Absolute: 1.3 10*3/uL — ABNORMAL HIGH (ref 0.1–1.0)
Monocytes Relative: 8 %
Neutro Abs: 12.2 10*3/uL — ABNORMAL HIGH (ref 1.7–7.7)
Neutrophils Relative %: 82 %
Platelets: 191 10*3/uL (ref 150–400)
RBC: 2.63 MIL/uL — ABNORMAL LOW (ref 4.22–5.81)
RDW: 17.9 % — ABNORMAL HIGH (ref 11.5–15.5)
WBC: 15.1 10*3/uL — ABNORMAL HIGH (ref 4.0–10.5)
nRBC: 3 % — ABNORMAL HIGH (ref 0.0–0.2)

## 2023-01-14 LAB — BASIC METABOLIC PANEL
Anion gap: 24 — ABNORMAL HIGH (ref 5–15)
BUN: 16 mg/dL (ref 8–23)
CO2: 21 mmol/L — ABNORMAL LOW (ref 22–32)
Calcium: 8.1 mg/dL — ABNORMAL LOW (ref 8.9–10.3)
Chloride: 90 mmol/L — ABNORMAL LOW (ref 98–111)
Creatinine, Ser: 4.63 mg/dL — ABNORMAL HIGH (ref 0.61–1.24)
GFR, Estimated: 12 mL/min — ABNORMAL LOW (ref >60)
Glucose, Bld: 109 mg/dL — ABNORMAL HIGH (ref 70–99)
Potassium: 3.9 mmol/L (ref 3.5–5.1)
Sodium: 135 mmol/L (ref 135–145)

## 2023-01-14 LAB — TROPONIN I (HIGH SENSITIVITY)
Troponin I (High Sensitivity): 1211 ng/L (ref <18)
Troponin I (High Sensitivity): 1597 ng/L (ref <18)

## 2023-01-14 LAB — LIPASE, BLOOD: Lipase: 32 U/L (ref 11–51)

## 2023-01-14 LAB — SARS CORONAVIRUS 2 BY RT PCR: SARS Coronavirus 2 by RT PCR: NEGATIVE

## 2023-01-14 LAB — BRAIN NATRIURETIC PEPTIDE: B Natriuretic Peptide: >4500 pg/mL — ABNORMAL HIGH (ref 0.0–100.0)

## 2023-01-14 MED ORDER — CINACALCET HCL 30 MG PO TABS
90.0000 mg | ORAL_TABLET | ORAL | Status: DC
  Administered 2023-01-17 – 2023-01-19 (×2): 90 mg via ORAL
  Filled 2023-01-14 (×2): qty 3

## 2023-01-14 MED ORDER — RENA-VITE PO TABS
1.0000 | ORAL_TABLET | Freq: Every day | ORAL | Status: DC
  Administered 2023-01-15 – 2023-01-17 (×3): 1 via ORAL
  Filled 2023-01-14 (×4): qty 1

## 2023-01-14 MED ORDER — MIDODRINE HCL 5 MG PO TABS
10.0000 mg | ORAL_TABLET | ORAL | Status: DC
  Administered 2023-01-17 – 2023-01-21 (×3): 10 mg via ORAL
  Filled 2023-01-14 (×3): qty 2

## 2023-01-14 MED ORDER — CHLORHEXIDINE GLUCONATE CLOTH 2 % EX PADS
6.0000 | MEDICATED_PAD | Freq: Every day | CUTANEOUS | Status: DC
  Administered 2023-01-16 – 2023-01-20 (×4): 6 via TOPICAL

## 2023-01-14 MED ORDER — ASPIRIN 81 MG PO CHEW
324.0000 mg | CHEWABLE_TABLET | Freq: Once | ORAL | Status: AC
  Administered 2023-01-14: 324 mg via ORAL
  Filled 2023-01-14: qty 4

## 2023-01-14 MED ORDER — CALCITRIOL 0.5 MCG PO CAPS
3.2500 ug | ORAL_CAPSULE | ORAL | Status: DC
  Administered 2023-01-17: 3.25 ug via ORAL
  Filled 2023-01-14: qty 1

## 2023-01-14 MED ORDER — HEPARIN (PORCINE) 25000 UT/250ML-% IV SOLN
1550.0000 [IU]/h | INTRAVENOUS | Status: DC
  Administered 2023-01-14: 900 [IU]/h via INTRAVENOUS
  Filled 2023-01-14 (×3): qty 250

## 2023-01-14 MED ORDER — ALBUMIN HUMAN 25 % IV SOLN: 25.0000 g | Freq: Once | INTRAVENOUS | Status: DC | PRN

## 2023-01-14 MED ORDER — SEVELAMER CARBONATE 800 MG PO TABS
1600.0000 mg | ORAL_TABLET | Freq: Three times a day (TID) | ORAL | Status: DC
  Administered 2023-01-15: 1600 mg via ORAL
  Filled 2023-01-14 (×2): qty 2

## 2023-01-14 NOTE — Consult Note (Incomplete)
Cardiology Consultation   Patient ID: Travis [name] MRN: [id number]; DOB: 01-06-1941  Admit date: 01/26/2023 Date of Consult: 01/15/2023  PCP:  Clinic, [facility] Providers Cardiologist:  Meriam Sprague, MD  Cardiology APP:  Beatrice Lecher, PA-C       Patient Profile:   Travis [name] is a 82 y.o. male with a hx of ESRD, CAD, HTN, chronic hypoxic respiratory failure, Hx of DVT who is being seen 81/12/2022 for the evaluation of chest pain at the request of ED team.  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  He reports new onset chest/abdominal discomfort in the setting of 3 weeks of progressive exertional dyspnea.  He reports that he has been going to dialysis consistently but does state that at times he does not we reached his dry weight although typically 3 L is removed.  In the ED, he was found to have pulmonary edema on CXR with tachycardia and some desaturation events. Initial hstrop of 1211 > 1597. He was initiated on heparin gtt and loaded with aspirin. Of note, he has a chronic leukocytosis. He was taken off of amlodipine and losartan earlier this year due to low blood pressures noted in dialysis.   Past Medical History:  Diagnosis Date   Anemia    Arthritis    Chronic combined systolic and diastolic CHF (congestive heart failure) (HCC)    a. Etiology of low EF not defined.   DVT (deep venous thrombosis) (HCC)    a. 01/2015: RLE DVT. VQ scan intermediate probability. Underwent renal bx complicated by perinephric hematoma; anticoagulation stopped and IVC filter placed.   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 Received plasma exchange and steroids but didn't respond and ended up starting hemodialysis June 2016.     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/2015   Protein calorie malnutrition (HCC)    Pulmonary fibrosis (HCC)    Sjogren's disease (HCC) 01/28/2015    Past Surgical History:  Procedure Laterality Date   AV FISTULA PLACEMENT Right 02/17/2015   Procedure: INSERTION OF RIGHT ARM  ARTERIOVENOUS (AV) GORE-TEX GRAFT ;  Surgeon: Sherren Kerns, MD;  Location: [facility] OR;  Service: Vascular;  Laterality: Right;   FLEXIBLE SIGMOIDOSCOPY N/A 08/04/2015   Procedure: FLEXIBLE SIGMOIDOSCOPY;  Surgeon: Charlott Rakes, MD;  Location: [facility] ENDOSCOPY;  Service: Endoscopy;  Laterality: N/A;   HEMORROIDECTOMY  1999   INSERTION OF DIALYSIS CATHETER N/A 02/17/2015   Procedure: INSERTION OF DIALYSIS CATHETER RIGHT INTERNAL JUGULAR VEIN;  Surgeon: Sherren Kerns, MD;  Location: [facility] OR;  Service: Vascular;  Laterality: N/A;   MULTIPLE TOOTH EXTRACTIONS     PERIPHERAL VASCULAR BALLOON ANGIOPLASTY Right 01/23/2019   Procedure: PERIPHERAL VASCULAR BALLOON ANGIOPLASTY;  Surgeon: Sherren Kerns, MD;  Location: [facility] INVASIVE CV LAB;  Service: Cardiovascular;  Laterality: Right;   REVISION OF ARTERIOVENOUS GORETEX GRAFT Right 03/29/2017   Procedure: REVISION  OF ARTERIOVENOUS GORETEX GRAFT;  Surgeon: Fransisco Hertz, MD;  Location: [facility] OR;  Service: Vascular;  Laterality: Right;   RIGHT/LEFT HEART CATH AND CORONARY ANGIOGRAPHY N/A 07/06/2022   Procedure: RIGHT/LEFT HEART CATH AND CORONARY ANGIOGRAPHY;  Surgeon: Marykay Lex, MD;  Location: [facility] INVASIVE CV LAB;  Service: Cardiovascular;  Laterality: N/A;   Surgical procedure to remove a mole as a child Right Eye area   At around 67 years old   TOTAL HIP ARTHROPLASTY  Right 12/06/2022   Procedure: TOTAL HIP ARTHROPLASTY ANTERIOR APPROACH;  Surgeon: Samson Frederic, MD;  Location: [facility] OR;  Service: Orthopedics;  Laterality: Right;     Home Medications:  Prior to Admission medications   Medication Sig Start Date End Date Taking? Authorizing Provider  Accu-Chek Softclix Lancets lancets Use to test blood glucose four times daily as directed. 05/26/21   Leroy Sea, MD  albuterol (VENTOLIN HFA) 108 (90 Base) MCG/ACT inhaler Inhale 2 puffs into the lungs every 6 (six) hours as needed for wheezing or shortness of breath. 08/19/22   [provider]  apixaban (ELIQUIS) 2.5 MG TABS tablet Take 1 tablet (2.5 mg total) by mouth 2 (two) times daily. 12/08/22   Clois Dupes, PA-C  atorvastatin (LIPITOR) 40 MG tablet Take 1 tablet (40 mg total) by mouth daily at 6 PM. Patient not taking: Reported on 12/12/2022 09/21/22   Narda Bonds, MD  Blood Glucose Monitoring Suppl (BLOOD GLUCOSE MONITOR SYSTEM) w/Device KIT Use up to four times daily as directed. 05/26/21   Leroy Sea, MD  Brinzolamide-Brimonidine 1-0.2 % SUSP Place 1 drop into both eyes in the morning and at bedtime. 11/03/21   [provider]  calcitRIOL (ROCALTROL) 0.25 MCG capsule Take 13 capsules (3.25 mcg total) by mouth every Monday, Wednesday, and Friday with hemodialysis. 12/25/21   Lonia Blood, MD  carvedilol (COREG) 12.5 MG tablet Take 1 tablet (12.5 mg total) by mouth 2 (two) times daily with a meal. 09/28/22   Meriam Sprague, MD  cinacalcet (SENSIPAR) 30 MG tablet Take 3 tablets (90 mg total) by mouth every Monday, Wednesday, and Friday with hemodialysis. 12/25/21   Lonia Blood, MD  ferric gluconate 62.5 mg in sodium chloride 0.9 % 100 mL Inject 62.5 mg into the vein every Wednesday with hemodialysis. 12/30/21   Lonia Blood, MD  gabapentin (NEURONTIN) 300 MG capsule Take 300 mg by mouth daily.    [provider]  glucose blood test strip Use to test  blood glucose four times daily as directed 05/26/21   Leroy Sea, MD  HYDROcodone-acetaminophen (NORCO/VICODIN) 5-325 MG tablet Take 1 tablet by mouth every 4 (four) hours as needed for moderate pain or severe pain. 12/07/22 12/07/23  Clois Dupes, PA-C  latanoprost (XALATAN) 0.005 % ophthalmic solution Place 1 drop into both eyes at bedtime. 05/05/21   [provider]  midodrine (PROAMATINE) 10 MG tablet Take 1 tablet (10 mg total) by mouth every Monday, Wednesday, and Friday. Take 10 mg (1 tablet) by mouth before dialysis and 10 mg (1 tablet) at dialysis as directed. 09/29/22   Meriam Sprague, MD  multivitamin (RENA-VIT) TABS tablet Take 1 tablet by mouth at bedtime. 12/08/22 03/08/23  Narda Bonds, MD  nitroGLYCERIN (NITROSTAT) 0.4 MG SL tablet Place 1 tablet (0.4 mg total) under the tongue every 5 (five) minutes as needed for chest pain (for total of 3 doses at the most). 07/08/22   Zannie Cove, MD  omeprazole (PRILOSEC) 40 MG capsule Take 40 mg by mouth daily. Patient not taking: Reported on 12/12/2022 07/31/15   [provider]  polyethylene glycol powder (MIRALAX) 17 GM/SCOOP powder Take 17 g by mouth daily. Hold for diarrhea (watery stools). 12/08/22   Narda Bonds, MD  sevelamer carbonate (RENVELA) 800 MG tablet Take 2 tablets (1,600 mg total) by mouth 3 (three) times daily with meals. Patient taking differently: Take 1,800 mg by mouth 3 (three) times daily with meals. 05/17/15   Lonia Blood, MD  timolol (TIMOPTIC-XR) 0.5 % ophthalmic gel-forming Place 1 drop into both eyes daily. 11/03/21   [provider]    Inpatient Medications: Scheduled Meds:  [START ON 01/17/2023] calcitRIOL  3.25 mcg Oral Q M,W,F-HD   Chlorhexidine Gluconate Cloth  6 each Topical Q0600   [START ON 01/17/2023] cinacalcet  90 mg Oral Q M,W,F-HD   [START ON 01/17/2023] midodrine  10 mg Oral Q M,W,F   multivitamin  1 tablet Oral QHS   sevelamer carbonate  1,600 mg Oral TID WC    Continuous Infusions:  albumin human     heparin 900 Units/hr (01/15/2023 2353)   PRN Meds: albumin human  Allergies:   No Known Allergies  Social History:   Social History   Socioeconomic History   Marital status: Married    Spouse name: Not on file   Number of children: Not on file   Years of education: Not on file   Highest education level: Not on file  Occupational History   Not on file  Tobacco Use   Smoking status: Never   Smokeless tobacco: Never  Vaping Use   Vaping Use: Never used  Substance and Sexual Activity   Alcohol use: No    Alcohol/week: 0.0 standard drinks of alcohol   Drug use: No   Sexual activity: Not Currently  Other Topics Concern   Not on file  Social History Narrative   Not on file   Social Determinants of Health   Financial Resource Strain: Not on file  Food Insecurity: No Food Insecurity (01/18/2023)   Hunger Vital Sign    Worried About Running Out of Food in the Last Year: Never true    Ran Out of Food in the Last Year: Never true  Transportation Needs: No Transportation Needs (02/04/2023)   PRAPARE - Administrator, Civil Service (Medical): No    Lack of Transportation (Non-Medical): No  Physical Activity: Not on file  Stress: Not on file  Social Connections: Not on file  Intimate Partner Violence: Not At Risk (02/04/2023)   Humiliation, Afraid, Rape, and Kick questionnaire    Fear of Current or Ex-Partner: No    Emotionally Abused: No    Physically Abused: No    Sexually Abused: No    Family History:    Family History  Problem Relation Age of Onset   Hypertension Mother    Healthy Father    Hypertension Sister    Hypertension Brother      ROS:  Please see the history of present illness.   All other ROS reviewed and negative.     Physical Exam/Data:   Vitals:   01/15/23 0132 01/15/23 0135 01/15/23 0147 01/15/23 0202  BP: 122/77 119/73 123/80 126/80  Pulse: (!) 104 99 (!) 103 (!) 104  Resp: (!) 26 (!) 30  (!) 27 17  Temp:      TempSrc:      SpO2: 100% 100% (!) 89% 100%  Weight:      Height:       No intake or output data in the 24 hours ending 01/15/23 0212    01/12/2023    4:56 PM 12/15/2022    7:41 PM 12/15/2022    2:35 PM  Last 3 Weights  Weight (lbs) 165 lb 146 lb 13.2 oz 153 lb 10.6 oz  Weight (kg) 74.844 kg 66.6 kg 69.7 kg     Body mass index is 23.68 kg/m.  General:  elderly male in NAD HEENT: normal Neck: + JVD Vascular: No carotid bruits; Distal pulses 2+ bilaterally Cardiac:  normal S1, S2; RRR; + systolic murmur  Lungs: coarse breath sounds, decreased aeration at bases, no wheezing, rhonchi or rales  Abd: soft, nontender, no hepatomegaly  Ext: no edema Skin: warm and dry  Neuro:  alert, oriented Psych:  Normal affect   EKG:  The EKG was personally reviewed and demonstrates:  inferolateral ST depressions, TWI, nonspecific ST/T changes, LVH  Relevant CV Studies: TTE - LVEF 45-50%  Laboratory Data:  High Sensitivity Troponin:   Recent Labs  Lab 01/31/2023 1841 01/31/2023 2052 01/31/2023 2330 01/15/23 0101  TROPONINIHS 1,211* 1,597* 1,697* 1,709*     Chemistry Recent Labs  Lab 02/05/2023 1841  NA 135  K 3.9  CL 90*  CO2 21*  GLUCOSE 109*  BUN 16  CREATININE 4.63*  CALCIUM 8.1*  GFRNONAA 12*  ANIONGAP 24*    No results for input(s): "PROT", "ALBUMIN", "AST", "ALT", "ALKPHOS", "BILITOT" in the last 168 hours. Lipids No results for input(s): "CHOL", "TRIG", "HDL", "LABVLDL", "LDLCALC", "CHOLHDL" in the last 168 hours.  Hematology Recent Labs  Lab 02/10/2023 1841  WBC 15.1*  RBC 2.63*  HGB 8.0*  HCT 26.2*  MCV 99.6  MCH 30.4  MCHC 30.5  RDW 17.9*  PLT 191   Thyroid No results for input(s): "TSH", "FREET4" in the last 168 hours.  BNP Recent Labs  Lab 02/05/2023 1841  BNP >4,500.0*    DDimer No results for input(s): "DDIMER" in the last 168 hours.   Radiology/Studies:  DG Chest Portable 1 View  Result Date: 01/24/2023 CLINICAL DATA:  Shortness  of breath EXAM: PORTABLE CHEST 1 VIEW COMPARISON:  CXR 12/10/22 FINDINGS: Cardiomegaly. Trace right pleural effusion. No pneumothorax. No focal airspace opacity. There are prominent bilateral interstitial opacities which could represent pulmonary venous congestion or mild pulmonary edema. Vascular stent projects over the right lung apex. No radiographically apparent displaced rib fractures. Visualized upper abdomen is unremarkable IMPRESSION: Cardiomegaly with trace right pleural effusion and mild pulmonary edema. Electronically Signed   By: Lorenza Cambridge M.D.   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  #Elevated troponin 2/2 Type 2 MI vs NSTEMI Presented with dyspnea, chest pain. Hstrop 1211 > 1597. BNP >4500 from prior 366. Clinical history is suspicious for demand, but patient has known severe CAD and ST depressions on ECG, Type 1 MI still on differential. - Trend trop to peak; trend ECG with troponin - ASA load, will initiate heparin gtt given concern for plaque rupture - Volume mgmt per Nephrology - Will hold on P2Y12 -  Please repeat TTE to reassess for LVEF, wall motion or valvular pathology  #Acute hypoxic respiratory failure 2/2 Acute pulmonary edema #ESRD - Appreciate Nephrology assistance with dialysis for fluid management  #  CAD, multivessel Prior [facility] 06/2022 with similar presentation - patient with mod to severe calcified multivessel disease. Would plan to optimize patient medically with dialysis and if chest pain persisting, can consider LHC/coronary angiogram for planning, though may not have targets for PCI. - Please continue atorvastatin 40mg  - Please hold apixaban while on heparin gtt  #HTN - Continue carvedilol 12.5mg  BID  Mild aortic stenosis -  Reassess gradients on echo. Prior Mean Gradient  15   Risk Assessment/Risk Scores:     TIMI Risk Score for Unstable Angina or Non-ST Elevation MI:   The patient's TIMI risk score is 5, which indicates a 26% risk of all cause mortality, new or recurrent myocardial infarction or need for urgent revascularization in the next 14 days.  New York Heart Association (NYHA) Functional Class NYHA Class III      For questions or updates, please contact [facility] Please consult [email or web address] for contact info under    Signed, Rachael Fee, MD  01/15/2023 2:12 AM

## 2023-01-14 NOTE — ED Notes (Signed)
Pt gone to dialysis 

## 2023-01-14 NOTE — Progress Notes (Signed)
ANTICOAGULATION CONSULT NOTE - Initial Consult  Pharmacy Consult for Heparin Indication: chest pain/ACS  No Known Allergies  Patient Measurements: Height: 5\' 10"  (177.8 cm) Weight: 74.8 kg (165 lb) IBW/kg (Calculated) : 73 Heparin Dosing Weight: 74.8 kg  Vital Signs: Temp: 98 F (36.7 C) (05/03 2125) Temp Source: Oral (05/03 2125) BP: 131/86 (05/03 2031) Pulse Rate: 104 (05/03 2031)  Labs: Recent Labs    02/03/2023 1841 01/31/2023 2052  HGB 8.0*  --   HCT 26.2*  --   PLT 191  --   CREATININE 4.63*  --   TROPONINIHS 1,211* 1,597*    Estimated Creatinine Clearance: 12.9 mL/min (A) (by C-G formula based on SCr of 4.63 mg/dL (H)).   Medical History: Past Medical History:  Diagnosis Date   Anemia    Arthritis    Chronic combined systolic and diastolic CHF (congestive heart failure) (HCC)    a. Etiology of low EF not defined.   DVT (deep venous thrombosis) (HCC)    a. 01/2015: RLE DVT. VQ scan intermediate probability. Underwent renal bx complicated by perinephric hematoma; anticoagulation stopped and IVC filter placed.   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 Received plasma exchange and steroids but didn't respond and ended up starting hemodialysis June 2016.    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 Patient is presenting with SOB. Heparin per pharmacy consult placed for chest pain/ACS.  Patient is supposed to be on apixaban prior to arrival, however patient and family member in room state not taking this. Will obtain baseline aPTT and HL to help determine.  Hgb 8 - near baseline; plt 191  Goal of Therapy:  Heparin level 0.3-0.7 units/ml aPTT 66-102 seconds Monitor platelets by anticoagulation protocol: Yes   Plan:  No initial heparin bolus Start heparin infusion at 900 units/hr Check aPTT & anti-Xa level in 8 hours and daily while on heparin Continue to monitor via aPTT until levels are correlated Continue to monitor H&H and platelets  Delmar Landau, PharmD, BCPS 01/18/2023 10:09 PM ED Clinical Pharmacist -  [phone]

## 2023-01-14 NOTE — ED Triage Notes (Signed)
Pt BIB [facility] from [facility]. Pt had staples removed from having R hip surgery when he became SOB. Pt was put on 2L but could not obtain O2 reading.   EMS VS BP 152/110 HR 120... to 108 RR 30

## 2023-01-14 NOTE — ED Provider Notes (Signed)
[place] EMERGENCY DEPARTMENT AT [facility] Provider Note   CSN: [id number] Arrival date & time: 02/05/2023  1644     History Chief Complaint  Patient presents with   Shortness of Breath    HPI Travis [name] is a 82 y.o. male presenting for shortness of breath.  Patient is confused providing no additional history.  He states that his chest hurt a few hours ago but that that is getting better. He then stated his chest pain was getting worse again.  He is asking to eat and drink.  He denies fevers chills nausea vomiting syncope or chest pain prior to all this.  States the pains been present for about 3 days. Patient states has been compliant with dialysis. Patient's recorded medical, surgical, social, medication list and allergies were reviewed in the Snapshot window as part of the initial history.   Review of Systems   Review of Systems  Constitutional:  Negative for chills and fever.  HENT:  Negative for ear pain and sore throat.   Eyes:  Negative for pain and visual disturbance.  Respiratory:  Positive for shortness of breath. Negative for cough.   Cardiovascular:  Positive for chest pain. Negative for palpitations.  Gastrointestinal:  Negative for abdominal pain and vomiting.  Genitourinary:  Negative for dysuria and hematuria.  Musculoskeletal:  Negative for arthralgias and back pain.  Skin:  Negative for color change and rash.  Neurological:  Negative for seizures and syncope.  All other systems reviewed and are negative.   Physical Exam Updated Vital Signs BP 130/82 (BP Location: Left Arm)   Pulse (!) 110   Temp 98 F (36.7 C) (Oral)   Resp (!) 22   Ht 5\' 10"  (1.778 m)   Wt 74.8 kg   SpO2 99%   BMI 23.68 kg/m  Physical Exam Vitals and nursing note reviewed.  Constitutional:      General: He is not in acute distress.    Appearance: He is well-developed.  HENT:     Head: Normocephalic and atraumatic.  Eyes:     Conjunctiva/sclera: Conjunctivae  normal.  Cardiovascular:     Rate and Rhythm: Normal rate and regular rhythm.     Heart sounds: No murmur heard. Pulmonary:     Effort: Pulmonary effort is normal. No respiratory distress.     Breath sounds: Normal breath sounds.  Abdominal:     Palpations: Abdomen is soft.     Tenderness: There is no abdominal tenderness.  Musculoskeletal:        General: No swelling.     Cervical back: Neck supple.  Skin:    General: Skin is warm and dry.     Capillary Refill: Capillary refill takes less than 2 seconds.  Neurological:     Mental Status: He is alert.  Psychiatric:        Mood and Affect: Mood normal.      ED Course/ Medical Decision Making/ A&P    Procedures .Critical Care  Performed by: Glyn Ade, MD Authorized by: Glyn Ade, MD   Critical care provider statement:    Critical care time (minutes):  95   Critical care was necessary to treat or prevent imminent or life-threatening deterioration of the following conditions:  Circulatory failure, cardiac failure, respiratory failure and metabolic crisis   Critical care was time spent personally by me on the following activities:  Development of treatment plan with patient or surrogate, discussions with consultants, evaluation of patient's response to treatment, examination of  patient, ordering and review of laboratory studies, ordering and review of radiographic studies, ordering and performing treatments and interventions, pulse oximetry, re-evaluation of patient's condition and review of old charts   I assumed direction of critical care for this patient from another provider in my specialty: yes     Care discussed with: admitting provider      Medications Ordered in ED Medications  Chlorhexidine Gluconate Cloth 2 % PADS 6 each (has no administration in time range)  heparin ADULT infusion 100 units/mL (25000 units/266mL) (has no administration in time range)  albumin human 25 % solution 25 g (has no  administration in time range)  sevelamer carbonate (RENVELA) tablet 1,600 mg (has no administration in time range)  multivitamin (RENA-VIT) tablet 1 tablet (has no administration in time range)  midodrine (PROAMATINE) tablet 10 mg (has no administration in time range)  cinacalcet (SENSIPAR) tablet 90 mg (has no administration in time range)  calcitRIOL (ROCALTROL) capsule 3.25 mcg (has no administration in time range)  aspirin chewable tablet 324 mg (324 mg Oral Given 02/05/2023 2126)    Medical Decision Making:    Travis [name] is a 82 y.o. male who presented to the ED today with multiple complaints detailed above.     Complete initial physical exam performed, notably the patient  was hemodynamically stable no acute distress.      Reviewed and confirmed nursing documentation for past medical history, family history, social history.    Initial Assessment:   Patient's history present on his physical exam findings raise concern for heart failure exacerbation.  Given his reported pain in the chest, ACS is also on the differential. Broad evaluation initiated with CBC CMP to evaluate for infectious or metabolic pathology respectively.  Cardiac pathology evaluated with EKG, BNP, troponin with plan for serial troponin studies.  Reassessment: On reassessment, oxygen status has remained reassuring but chest x-ray demonstrated intrathoracic pleural effusion. Lab work demonstrates BNP elevation as well as troponin elevation both of which are worse than prior. Nephrology was consulted for concern for acute volume overload and they recommended immediate dialysis.  They are arranging for transfer to dialysis unit at this time. Additionally, cardiology was consulted who agreed with need for further cardiac evaluation.  They recommended heparinization and ASA load. Both teams evaluated at bedside before consulted hospitalist who also recommended progressive unit admission for further diagnostic care and  management. Patient required significant coordination, reassessment.  Fortunately in no acute distress while in the emergency room prior to admission.  Disposition:   Based on the above findings, I believe this patient is stable for admission.    Patient/family educated about specific findings on our evaluation and explained exact reasons for admission.  Patient/family educated about clinical situation and time was allowed to answer questions.   Admission team communicated with and agreed with need for admission. Patient admitted. Patient ready to move at this time.     Emergency Department Medication Summary:   Medications  Chlorhexidine Gluconate Cloth 2 % PADS 6 each (has no administration in time range)  heparin ADULT infusion 100 units/mL (25000 units/248mL) (has no administration in time range)  albumin human 25 % solution 25 g (has no administration in time range)  sevelamer carbonate (RENVELA) tablet 1,600 mg (has no administration in time range)  multivitamin (RENA-VIT) tablet 1 tablet (has no administration in time range)  midodrine (PROAMATINE) tablet 10 mg (has no administration in time range)  cinacalcet (SENSIPAR) tablet 90 mg (has no administration in time range)  calcitRIOL (ROCALTROL) capsule 3.25 mcg (has no administration in time range)  aspirin chewable tablet 324 mg (324 mg Oral Given 02/09/2023 2126)        Clinical Impression:  1. NSTEMI (non-ST elevated myocardial infarction) (HCC)   2. Acute on chronic congestive heart failure, unspecified heart failure type (HCC)      Admit   Final Clinical Impression(s) / ED Diagnoses Final diagnoses:  NSTEMI (non-ST elevated myocardial infarction) (HCC)  Acute on chronic congestive heart failure, unspecified heart failure type [facility])    Rx / DC Orders ED Discharge Orders     None         Glyn Ade, MD 01/27/2023 2342

## 2023-01-14 NOTE — Consult Note (Signed)
Travis [name] Renal Consultation Note    Indication for Consultation:  Management of ESRD/hemodialysis; anemia, hypertension/volume and secondary hyperparathyroidism  HPI: Travis [name] is a 82 y.o. male with a PMH significant for HTN, lower extremity DVT s/p IVC filter, CAD,chronic combined systolic and diastolic CHF, Sjogren's disease, recent right hip fx s/p THA, and ESRD who presented to [facility] ED via EMS from [facility] medicine c/o SOB and chest pain.  He did go to HD today but was late so HD was cut short by 40 minutes.  He left below his EDW.  In the ED, Bp 121/82, HR 107, temp 98.6, RR 31, SpO2 100% on 2L.  Labs were notable for K 3.9, Co2 21, BUN 16, Cr 4.63, gluc 109, BNP >4500, troponin I 1597, Hgb 8, WBC 15.1, negative covid-19.  CXR with cardiomegaly, trace right pleural effusion, and mild pulmonary edema.  ECG with sinus tach, IVCD, prolonged QT.  He is being admitted for NSTEMI and we were consulted for possible HD and UF due to his respiratory status.    He admits to ongoing SOB but reports that the chest pain has improved.  He denies any N/V, diaphoresis, palpitations, or lower extremity edema.  Past Medical History:  Diagnosis Date   Anemia    Arthritis    Chronic combined systolic and diastolic CHF (congestive heart failure) (HCC)    a. Etiology of low EF not defined.   DVT (deep venous thrombosis) (HCC)    a. 01/2015: RLE DVT. VQ scan intermediate probability. Underwent renal bx complicated by perinephric hematoma; anticoagulation stopped and IVC filter placed.   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 Received plasma exchange and steroids but didn't respond and ended up starting hemodialysis June 2016.    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   Protein calorie malnutrition (HCC)    Pulmonary fibrosis (HCC)    Sjogren's disease (HCC) 01/28/2015   Past Surgical History:  Procedure Laterality Date   AV FISTULA PLACEMENT Right 02/17/2015   Procedure: INSERTION OF RIGHT ARM  ARTERIOVENOUS (AV) GORE-TEX GRAFT ;  Surgeon: Sherren Kerns, MD;  Location: [facility] OR;  Service: Vascular;  Laterality: Right;   FLEXIBLE SIGMOIDOSCOPY N/A 08/04/2015   Procedure: FLEXIBLE SIGMOIDOSCOPY;  Surgeon: Charlott Rakes, MD;  Location: [facility] ENDOSCOPY;  Service: Endoscopy;  Laterality: N/A;   HEMORROIDECTOMY  1999   INSERTION OF DIALYSIS CATHETER N/A 02/17/2015   Procedure: INSERTION OF DIALYSIS CATHETER RIGHT INTERNAL JUGULAR VEIN;  Surgeon: Sherren Kerns, MD;  Location: [facility] OR;  Service: Vascular;  Laterality: N/A;   MULTIPLE TOOTH EXTRACTIONS     PERIPHERAL VASCULAR BALLOON ANGIOPLASTY Right 01/23/2019   Procedure: PERIPHERAL VASCULAR BALLOON ANGIOPLASTY;  Surgeon: Sherren Kerns, MD;  Location: [facility] INVASIVE CV LAB;  Service: Cardiovascular;  Laterality: Right;   REVISION OF ARTERIOVENOUS GORETEX GRAFT Right 03/29/2017   Procedure: REVISION OF ARTERIOVENOUS GORETEX GRAFT;  Surgeon: Fransisco Hertz, MD;  Location: [facility] OR;  Service: Vascular;  Laterality: Right;   RIGHT/LEFT HEART CATH AND CORONARY ANGIOGRAPHY N/A 07/06/2022   Procedure:  RIGHT/LEFT HEART CATH AND CORONARY ANGIOGRAPHY;  Surgeon: Marykay Lex, MD;  Location: [facility] INVASIVE CV LAB;  Service: Cardiovascular;  Laterality: N/A;   Surgical procedure to remove a mole as a child Right Eye area   At around 78 years old   TOTAL HIP ARTHROPLASTY Right 12/06/2022   Procedure: TOTAL HIP ARTHROPLASTY ANTERIOR APPROACH;  Surgeon: Samson Frederic, MD;  Location: [facility] OR;  Service: Orthopedics;  Laterality: Right;   Family  History:   Family History  Problem Relation Age of Onset   Hypertension Mother    Healthy Father    Hypertension Sister    Hypertension Brother    Social History:  reports that he has never smoked. He has never used smokeless tobacco. He reports that he does not drink alcohol and does not use drugs. No Known Allergies Prior to Admission medications   Medication Sig Start Date End Date Taking? Authorizing Provider  Accu-Chek Softclix Lancets lancets Use to test blood glucose four times daily as directed. 05/26/21   Leroy Sea, MD  albuterol (VENTOLIN HFA) 108 (90 Base) MCG/ACT inhaler Inhale 2 puffs into the lungs every 6 (six) hours as needed for wheezing or shortness of breath. 08/19/22   [provider]  apixaban (ELIQUIS) 2.5 MG TABS tablet Take 1 tablet (2.5 mg total) by mouth 2 (two) times daily. 12/08/22   Clois Dupes, PA-C  atorvastatin (LIPITOR) 40 MG tablet Take 1 tablet (40 mg total) by mouth daily at 6 PM. Patient not taking: Reported on 12/12/2022 09/21/22   Narda Bonds, MD  Blood Glucose Monitoring Suppl (BLOOD GLUCOSE MONITOR SYSTEM) w/Device KIT Use up to four times daily as directed. 05/26/21   Leroy Sea, MD  Brinzolamide-Brimonidine 1-0.2 % SUSP Place 1 drop into both eyes in the morning and at bedtime. 11/03/21   [provider]  calcitRIOL (ROCALTROL) 0.25 MCG capsule Take 13 capsules (3.25 mcg total) by mouth every Monday, Wednesday, and Friday with hemodialysis. 12/25/21   Lonia Blood, MD  carvedilol (COREG) 12.5 MG tablet Take 1 tablet (12.5 mg total) by mouth 2 (two) times daily with a meal. 09/28/22   Meriam Sprague, MD  cinacalcet (SENSIPAR) 30 MG tablet Take 3 tablets (90 mg total) by mouth every Monday, Wednesday, and Friday with hemodialysis. 12/25/21   Lonia Blood, MD  ferric gluconate 62.5 mg in sodium chloride 0.9 % 100 mL Inject 62.5 mg into the vein every Wednesday with hemodialysis. 12/30/21   Lonia Blood,  MD  gabapentin (NEURONTIN) 300 MG capsule Take 300 mg by mouth daily.    [provider]  glucose blood test strip Use to test blood glucose four times daily as directed 05/26/21   Leroy Sea, MD  HYDROcodone-acetaminophen (NORCO/VICODIN) 5-325 MG tablet Take 1 tablet by mouth every 4 (four) hours as needed for moderate pain or severe pain. 12/07/22 12/07/23  Clois Dupes, PA-C  latanoprost (XALATAN) 0.005 % ophthalmic solution Place 1 drop into both eyes at bedtime. 05/05/21   [provider]  midodrine (PROAMATINE) 10 MG tablet Take 1 tablet (10 mg total) by mouth every Monday, Wednesday, and Friday. Take 10 mg (1 tablet) by mouth before dialysis and 10 mg (1 tablet) at dialysis as directed. 09/29/22   Meriam Sprague, MD  multivitamin (RENA-VIT) TABS tablet Take 1 tablet by mouth at bedtime. 12/08/22 03/08/23  Narda Bonds, MD  nitroGLYCERIN (NITROSTAT) 0.4 MG SL tablet Place 1 tablet (0.4 mg total)  under the tongue every 5 (five) minutes as needed for chest pain (for total of 3 doses at the most). 07/08/22   Zannie Cove, MD  omeprazole (PRILOSEC) 40 MG capsule Take 40 mg by mouth daily. Patient not taking: Reported on 12/12/2022 07/31/15   [provider]  polyethylene glycol powder (MIRALAX) 17 GM/SCOOP powder Take 17 g by mouth daily. Hold for diarrhea (watery stools). 12/08/22   Narda Bonds, MD  sevelamer carbonate (RENVELA) 800 MG tablet Take 2 tablets (1,600 mg total) by mouth 3 (three) times daily with meals. Patient taking differently: Take 1,800 mg by mouth 3 (three) times daily with meals. 05/17/15   Lonia Blood, MD  timolol (TIMOPTIC-XR) 0.5 % ophthalmic gel-forming Place 1 drop into both eyes daily. 11/03/21   [provider]   Current Facility-Administered Medications  Medication Dose Route Frequency Provider Last Rate Last Admin   [START ON 01/15/2023] Chlorhexidine Gluconate Cloth 2 % PADS 6 each  6 each Topical Q0600 Terrial Rhodes, MD       Current Outpatient Medications  Medication Sig Dispense Refill   Accu-Chek Softclix Lancets lancets Use to test blood glucose four times daily as directed. 100 each 1   albuterol (VENTOLIN HFA) 108 (90 Base) MCG/ACT inhaler Inhale 2 puffs into the lungs every 6 (six) hours as needed for wheezing or shortness of breath.     apixaban (ELIQUIS) 2.5 MG TABS tablet Take 1 tablet (2.5 mg total) by mouth 2 (two) times daily. 60 tablet 0   atorvastatin (LIPITOR) 40 MG tablet Take 1 tablet (40 mg total) by mouth daily at 6 PM. (Patient not taking: Reported on 12/12/2022) 30 tablet 0   Blood Glucose Monitoring Suppl (BLOOD GLUCOSE MONITOR SYSTEM) w/Device KIT Use up to four times daily as directed. 1 kit 1   Brinzolamide-Brimonidine 1-0.2 % SUSP Place 1 drop into both eyes in the morning and at bedtime.     calcitRIOL (ROCALTROL) 0.25 MCG capsule Take 13 capsules (3.25 mcg total) by mouth every Monday, Wednesday, and Friday with hemodialysis.     carvedilol (COREG) 12.5 MG tablet Take 1 tablet (12.5 mg total) by mouth 2 (two) times daily with a meal. 180 tablet 2   cinacalcet (SENSIPAR) 30 MG tablet Take 3 tablets (90 mg total) by mouth every Monday, Wednesday, and Friday with hemodialysis. 60 tablet    ferric gluconate 62.5 mg in sodium chloride 0.9 % 100 mL Inject 62.5 mg into the vein every Wednesday with hemodialysis.     gabapentin (NEURONTIN) 300 MG capsule Take 300 mg by mouth daily.     glucose blood test strip Use to test blood glucose four times daily as directed 100 each 1   HYDROcodone-acetaminophen (NORCO/VICODIN) 5-325 MG tablet Take 1 tablet by mouth every 4 (four) hours as needed for moderate pain or severe pain. 40 tablet 0   latanoprost (XALATAN) 0.005 % ophthalmic solution Place 1 drop into both eyes at bedtime.     midodrine (PROAMATINE) 10 MG tablet Take 1 tablet (10 mg total) by mouth every Monday, Wednesday, and Friday. Take 10 mg (1 tablet) by mouth before dialysis and  10 mg (1 tablet) at dialysis as directed. 45 tablet 1   multivitamin (RENA-VIT) TABS tablet Take 1 tablet by mouth at bedtime. 90 tablet 0   nitroGLYCERIN (NITROSTAT) 0.4 MG SL tablet Place 1 tablet (0.4 mg total) under the tongue every 5 (five) minutes as needed for chest pain (for total of 3 doses at  the most). 10 tablet 1   omeprazole (PRILOSEC) 40 MG capsule Take 40 mg by mouth daily. (Patient not taking: Reported on 12/12/2022)  11   polyethylene glycol powder (MIRALAX) 17 GM/SCOOP powder Take 17 g by mouth daily. Hold for diarrhea (watery stools). 255 g 0   sevelamer carbonate (RENVELA) 800 MG tablet Take 2 tablets (1,600 mg total) by mouth 3 (three) times daily with meals. (Patient taking differently: Take 1,800 mg by mouth 3 (three) times daily with meals.) 90 tablet 0   timolol (TIMOPTIC-XR) 0.5 % ophthalmic gel-forming Place 1 drop into both eyes daily.     Labs: Basic Metabolic Panel: Recent Labs  Lab 01/12/2023 1841  NA 135  K 3.9  CL 90*  CO2 21*  GLUCOSE 109*  BUN 16  CREATININE 4.63*  CALCIUM 8.1*   Liver Function Tests: No results for input(s): "AST", "ALT", "ALKPHOS", "BILITOT", "PROT", "ALBUMIN" in the last 168 hours. Recent Labs  Lab 02/01/2023 1841  LIPASE 32   No results for input(s): "AMMONIA" in the last 168 hours. CBC: Recent Labs  Lab 01/27/2023 1841  WBC 15.1*  NEUTROABS 12.2*  HGB 8.0*  HCT 26.2*  MCV 99.6  PLT 191   Cardiac Enzymes: No results for input(s): "CKTOTAL", "CKMB", "CKMBINDEX", "TROPONINI" in the last 168 hours. CBG: No results for input(s): "GLUCAP" in the last 168 hours. Iron Studies: No results for input(s): "IRON", "TIBC", "TRANSFERRIN", "FERRITIN" in the last 72 hours. Studies/Results: DG Chest Portable 1 View  Result Date: 01/18/2023 CLINICAL DATA:  Shortness of breath EXAM: PORTABLE CHEST 1 VIEW COMPARISON:  CXR 12/10/22 FINDINGS: Cardiomegaly. Trace right pleural effusion. No pneumothorax. No focal airspace opacity. There are  prominent bilateral interstitial opacities which could represent pulmonary venous congestion or mild pulmonary edema. Vascular stent projects over the right lung apex. No radiographically apparent displaced rib fractures. Visualized upper abdomen is unremarkable IMPRESSION: Cardiomegaly with trace right pleural effusion and mild pulmonary edema. Electronically Signed   By: Lorenza Cambridge M.D.   On: 02/09/2023 17:51    ROS: Pertinent items are noted in HPI. Physical Exam: Vitals:   01/13/2023 1800 01/17/2023 1815 01/25/2023 2031 01/23/2023 2125  BP: 128/79 131/73 131/86   Pulse:  (!) 127 (!) 104   Resp: (!) 31 (!) 26 (!) 22   Temp:    98 F (36.7 C)  TempSrc:    Oral  SpO2:  (!) 83% 100%   Weight:      Height:          Weight change:  No intake or output data in the 24 hours ending 02/09/2023 2202 BP 131/86 (BP Location: Left Arm)   Pulse (!) 104   Temp 98 F (36.7 C) (Oral)   Resp (!) 22   Ht 5\' 10"  (1.778 m)   Wt 74.8 kg   SpO2 100%   BMI 23.68 kg/m  General appearance: fatigued and no distress Head: Normocephalic, without obvious abnormality, atraumatic Resp: occ rhonchi at the bases, otherwise CTA Cardio: tachycardic, no rub GI: soft, non-tender; bowel sounds normal; no masses,  no organomegaly Extremities: extremities normal, atraumatic, no cyanosis or edema and right forearm AVG +T/B Dialysis Access:  Dialysis Orders: Center: [facility]  on MWF . EDW 67.4 kg HD Bath 2K/2Ca  Time 3:30 Heparin none. Access RAVG BFR 400 DFR A1.5   calcitriol 2.5 mcg po/HD Epogen none   Units IV/HD    Other Sensipar 150 mg po with HD  Assessment/Plan:  NSTEMI - Cardiology consulted and  pending their recommendations but felt he was stable for admission to the floor under hospitalist care.    SOB - has some pulmonary edema on CXR, markedly elevated BNP, and tachypnea, likely flash pulmonary edema due to NSTEMI.  Will plan for sequential HD with UF first to help with respiratory distress.  He already  received almost 3 hours of HD today, but only removed 0.7L and was below his edw.  He will need a new lower edw at discharge.  ESRD -  Had HD today, however he is SOB and has tachypnea. Will plan for short session of sequential HD with UF first tonight to help with his respiratory status.  Does not need further clearance due to HD earlier today.  Per Dr. Doran Durand and Cardiology, he is stable to come up to [facility] for HD.  Hypertension/volume  - as above, will plan for sequential HD with UF first and UF as tolerated  Anemia  - Hgb low at 8.  Continue to follow and transfuse if Hgb drops  Metabolic bone disease -  continue with home meds  Nutrition -  renal diet  Chronic combined systolic and diastolic CHF - EF 45-50% and grade II DD by ECHO 06/2022.  Irena Cords, MD [facility], [facility] 02/01/2023, 10:02 PM

## 2023-01-14 NOTE — H&P (Addendum)
History and Physical  Travis [name] ZOX:[id number] DOB: 1941/01/09 DOA: 02/04/2023  Referring physician: Dr. Doran Durand, EDP  PCP: [facility]  Outpatient Specialists: Nephrology, cardiology. Patient coming from: Home  Chief Complaint: Shortness of breath.   HPI: Travis [name] is a 82 y.o. male with medical history significant for ESRD on HD MWF, hypertension, coronary artery disease, chronic combined diastolic and systolic CHF, Sjogren's disease, recent right hip surgery, history of DVT status post IVC filter, who initially presented to [facility] family medicine to have staples removed from recent right hip surgery.  The patient became acutely dyspneic during the visit.  He was placed on 2 L but they could not obtain an oxygen reading.  EMS was activated and the patient was brought to the ED for further evaluation.    In the ED, the patient denies chest pain however he endorses non exertional dyspnea for the past few days.  On exam conversational dyspnea noted.  Tachycardic and tachypneic.  Not hypoxic with O2 saturation of 99% on room air.  Lab studies markable for BNP greater than 4500.  High-sensitivity troponin uptrending 1597 from 1211.  EKG revealed sinus tachycardia with a rate of 105.  Nonspecific ST-T changes.  QTc 615.  EDP discussed the case with nephrology who is planning hemodialysis tonight.  EDP also discussed the case with cardiology who will see the patient in consultation tonight, recommended full dose aspirin and heparin drip.  The patient was admitted by [facility], hospitalist service, to [facility] progressive care unit as inpatient status.  ED Course: Tmax 98.  BP 130/82, pulse 110, respiratory 22, O2 saturation 99% on room air.  Lab studies remarkable for BNP greater than 4500, troponin 1211, repeat 1597.  Serum bicarb 21, creatinine 4.63, anion gap 24.  WBC 15.1, hemoglobin 8.0, platelet count 191.  Neutrophil count 12.2.  Chest x-ray revealed cardiomegaly  with trace right pleural effusion and mild pulmonary edema.  Review of Systems: Review of systems as noted in the HPI. All other systems reviewed and are negative.   Past Medical History:  Diagnosis Date   Anemia    Arthritis    Chronic combined systolic and diastolic CHF (congestive heart failure) (HCC)    a. Etiology of low EF not defined.   DVT (deep venous thrombosis) (HCC)    a. 01/2015: RLE DVT. VQ scan intermediate probability. Underwent renal bx complicated by perinephric hematoma; anticoagulation stopped and IVC filter placed.   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 Received plasma exchange and steroids but didn't respond and ended up starting hemodialysis June 2016.    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   Protein calorie malnutrition (HCC)    Pulmonary fibrosis (HCC)    Sjogren's disease (HCC) 01/28/2015   Past Surgical History:  Procedure Laterality Date   AV FISTULA PLACEMENT Right 02/17/2015   Procedure: INSERTION OF RIGHT ARM  ARTERIOVENOUS (AV) GORE-TEX GRAFT ;  Surgeon: Sherren Kerns, MD;  Location: [facility] OR;  Service: Vascular;  Laterality: Right;   FLEXIBLE SIGMOIDOSCOPY N/A 08/04/2015   Procedure: FLEXIBLE  SIGMOIDOSCOPY;  Surgeon: Charlott Rakes, MD;  Location: [facility] ENDOSCOPY;  Service: Endoscopy;  Laterality: N/A;   HEMORROIDECTOMY  1999   INSERTION OF DIALYSIS CATHETER N/A 02/17/2015   Procedure: INSERTION OF DIALYSIS CATHETER RIGHT INTERNAL JUGULAR VEIN;  Surgeon: Sherren Kerns, MD;  [facility];  Service: Vascular;  Laterality: N/A;   MULTIPLE TOOTH  EXTRACTIONS     PERIPHERAL VASCULAR BALLOON ANGIOPLASTY Right 01/23/2019   Procedure: PERIPHERAL VASCULAR BALLOON ANGIOPLASTY;  Surgeon: Sherren Kerns, MD;  Location: [facility] INVASIVE CV LAB;  Service: Cardiovascular;  Laterality: Right;   REVISION OF ARTERIOVENOUS GORETEX GRAFT Right 03/29/2017   Procedure: REVISION OF ARTERIOVENOUS GORETEX GRAFT;  Surgeon: Fransisco Hertz, MD;  Location: [facility] OR;  Service: Vascular;  Laterality: Right;   RIGHT/LEFT HEART CATH AND CORONARY ANGIOGRAPHY N/A 07/06/2022   Procedure: RIGHT/LEFT HEART CATH AND CORONARY ANGIOGRAPHY;  Surgeon: Marykay Lex, MD;  Location: [facility] INVASIVE CV LAB;  Service: Cardiovascular;  Laterality: N/A;   Surgical procedure to remove a mole as a child Right Eye area   At around 5 years old   TOTAL HIP ARTHROPLASTY Right 12/06/2022   Procedure: TOTAL HIP ARTHROPLASTY ANTERIOR APPROACH;  Surgeon: Samson Frederic, MD;  Location: [facility] OR;  Service: Orthopedics;  Laterality: Right;    Social History:  reports that he has never smoked. He has never used smokeless tobacco. He reports that he does not drink alcohol and does not use drugs.   No Known Allergies  Family History  Problem Relation Age of Onset   Hypertension Mother    Healthy Father    Hypertension Sister    Hypertension Brother       Prior to Admission medications   Medication Sig Start Date End Date Taking? Authorizing Provider  Accu-Chek Softclix Lancets lancets Use to test blood glucose four times daily as directed. 05/26/21   Leroy Sea, MD  albuterol (VENTOLIN HFA) 108 (90 Base) MCG/ACT inhaler Inhale 2 puffs into the lungs every 6 (six) hours as needed for wheezing or shortness of breath. 08/19/22   [provider]  apixaban (ELIQUIS) 2.5 MG TABS tablet Take 1 tablet (2.5 mg total) by mouth 2 (two) times daily. 12/08/22   Clois Dupes, PA-C  atorvastatin (LIPITOR) 40 MG tablet Take 1 tablet (40 mg total) by mouth daily at 6 PM. Patient not taking: Reported  on 12/12/2022 09/21/22   Narda Bonds, MD  Blood Glucose Monitoring Suppl (BLOOD GLUCOSE MONITOR SYSTEM) w/Device KIT Use up to four times daily as directed. 05/26/21   Leroy Sea, MD  Brinzolamide-Brimonidine 1-0.2 % SUSP Place 1 drop into both eyes in the morning and at bedtime. 11/03/21   [provider]  calcitRIOL (ROCALTROL) 0.25 MCG capsule Take 13 capsules (3.25 mcg total) by mouth every Monday, Wednesday, and Friday with hemodialysis. 12/25/21   Lonia Blood, MD  carvedilol (COREG) 12.5 MG tablet Take 1 tablet (12.5 mg total) by mouth 2 (two) times daily with a meal. 09/28/22   Meriam Sprague, MD  cinacalcet (SENSIPAR) 30 MG tablet Take 3 tablets (90 mg total) by mouth every Monday, Wednesday, and Friday with hemodialysis. 12/25/21   Lonia Blood, MD  ferric gluconate 62.5 mg in sodium chloride 0.9 % 100 mL Inject 62.5 mg into the vein every Wednesday with hemodialysis. 12/30/21   Lonia Blood, MD  gabapentin (NEURONTIN) 300 MG capsule Take 300 mg by mouth daily.    [provider]  glucose blood test  strip Use to test blood glucose four times daily as directed 05/26/21   Leroy Sea, MD  HYDROcodone-acetaminophen (NORCO/VICODIN) 5-325 MG tablet Take 1 tablet by mouth every 4 (four) hours as needed for moderate pain or severe pain. 12/07/22 12/07/23  Clois Dupes, PA-C  latanoprost (XALATAN) 0.005 % ophthalmic solution Place 1 drop into both eyes at bedtime. 05/05/21   [provider]  midodrine (PROAMATINE) 10 MG tablet Take 1 tablet (10 mg total) by mouth every Monday, Wednesday, and Friday. Take 10 mg (1 tablet) by mouth before dialysis and 10 mg (1 tablet) at dialysis as directed. 09/29/22   Meriam Sprague, MD  multivitamin (RENA-VIT) TABS tablet Take 1 tablet by mouth at bedtime. 12/08/22 03/08/23  Narda Bonds, MD  nitroGLYCERIN (NITROSTAT) 0.4 MG SL tablet Place 1 tablet (0.4 mg total) under the tongue every 5 (five) minutes as  needed for chest pain (for total of 3 doses at the most). 07/08/22   Zannie Cove, MD  omeprazole (PRILOSEC) 40 MG capsule Take 40 mg by mouth daily. Patient not taking: Reported on 12/12/2022 07/31/15   [provider]  polyethylene glycol powder (MIRALAX) 17 GM/SCOOP powder Take 17 g by mouth daily. Hold for diarrhea (watery stools). 12/08/22   Narda Bonds, MD  sevelamer carbonate (RENVELA) 800 MG tablet Take 2 tablets (1,600 mg total) by mouth 3 (three) times daily with meals. Patient taking differently: Take 1,800 mg by mouth 3 (three) times daily with meals. 05/17/15   Lonia Blood, MD  timolol (TIMOPTIC-XR) 0.5 % ophthalmic gel-forming Place 1 drop into both eyes daily. 11/03/21   [provider]    Physical Exam: BP 130/82 (BP Location: Left Arm)   Pulse (!) 110   Temp 98 F (36.7 C) (Oral)   Resp (!) 22   Ht 5\' 10"  (1.778 m)   Wt 74.8 kg   SpO2 99%   BMI 23.68 kg/m   General: 82 y.o. year-old male well developed well nourished in no acute distress.  Alert and oriented x3. Cardiovascular: Regular rate and rhythm with no rubs or gallops.  No thyromegaly. Left JVD noted.  Trace lower extremity edema bilaterally Respiratory: Mild rales at bases.  Poor inspiratory effort. Abdomen: Soft nontender nondistended with normal bowel sounds x4 quadrants. Muskuloskeletal: No cyanosis or clubbing noted bilaterally Neuro: CN II-XII intact, strength, sensation, reflexes Skin: No ulcerative lesions noted or rashes Psychiatry: Judgement and insight appear normal. Mood is appropriate for condition and setting          Labs on Admission:  Basic Metabolic Panel: Recent Labs  Lab 02/09/2023 1841  NA 135  K 3.9  CL 90*  CO2 21*  GLUCOSE 109*  BUN 16  CREATININE 4.63*  CALCIUM 8.1*   Liver Function Tests: No results for input(s): "AST", "ALT", "ALKPHOS", "BILITOT", "PROT", "ALBUMIN" in the last 168 hours. Recent Labs  Lab 01/22/2023 1841  LIPASE 32   No  results for input(s): "AMMONIA" in the last 168 hours. CBC: Recent Labs  Lab 02/04/2023 1841  WBC 15.1*  NEUTROABS 12.2*  HGB 8.0*  HCT 26.2*  MCV 99.6  PLT 191   Cardiac Enzymes: No results for input(s): "CKTOTAL", "CKMB", "CKMBINDEX", "TROPONINI" in the last 168 hours.  BNP (last 3 results) Recent Labs    09/19/22 2136 12/05/22 2236 02/09/2023 1841  BNP 1,977.6* 365.7* >4,500.0*    ProBNP (last 3 results) No results for input(s): "PROBNP" in the last 8760 hours.  CBG: No  results for input(s): "GLUCAP" in the last 168 hours.  Radiological Exams on Admission: DG Chest Portable 1 View  Result Date: 02/11/2023 CLINICAL DATA:  Shortness of breath EXAM: PORTABLE CHEST 1 VIEW COMPARISON:  CXR 12/10/22 FINDINGS: Cardiomegaly. Trace right pleural effusion. No pneumothorax. No focal airspace opacity. There are prominent bilateral interstitial opacities which could represent pulmonary venous congestion or mild pulmonary edema. Vascular stent projects over the right lung apex. No radiographically apparent displaced rib fractures. Visualized upper abdomen is unremarkable IMPRESSION: Cardiomegaly with trace right pleural effusion and mild pulmonary edema. Electronically Signed   By: Lorenza Cambridge M.D.   On: 01/12/2023 17:51    EKG: I independently viewed the EKG done and my findings are as followed: Sinus tachycardia rate of 105.  Nonspecific ST-T changes.  QTc 615.  Assessment/Plan Present on Admission:  Volume overload  Principal Problem:   Volume overload  Volume overload BNP significantly elevated greater than 4500, left JVD, pulmonary edema on chest x-ray. ESRD on HD MWF Plan for hemodialysis on 02/05/2023 evening. Volume status and electrolytes managed with hemodialysis Appreciate nephrology's assistance.  Elevated troponin with concern for NSTEMI High-sensitivity troponin uptrending,1597 from 1211 Received a full dose aspirin 325 mg x 1 Heparin drip was started with the  guidance of cardiology, continue. Cardiology is in consultation, recommended aspirin and heparin drip Follow 2D echo  Acute on chronic combined diastolic and systolic CHF Last 2D echo done on 07/06/2022 revealed LVEF 45 to 50% with grade 2 diastolic dysfunction. Presented with BNP greater than 4500, left JVD, pulmonary edema on chest x-ray Volume status managed with hemodialysis. Cardiology consulted, appreciate assistance. Start strict I's and O's and daily weight  QTc prolongation Admission 12-lead EKG revealed QTc 615 Avoid QTc prolonging agents Optimize magnesium and potassium levels Electrolytes managed with hemodialysis.  High anion gap metabolic acidosis Serum bicarb 21, anion gap 24. Hemodialysis planned tonight. Repeat renal function panel in the morning.  Leukocytosis, suspect reactive Nonseptic appearing WBC 15.1 Monitor fever curve and WBCs Repeat CBC in the morning  History of right lower extremity DVT on Eliquis Status post IVC filter placement Home Eliquis is on hold while on heparin drip. Heparin drip renally dosed by pharmacy, appreciate pharmacy's assistance.  History of hypotension on midodrine Resume home midodrine after hemodialysis Maintain MAP greater than 65 Closely monitor vital signs.  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   DVT prophylaxis: Heparin drip  Code Status: Full code, as stated by the patient himself.  Family Communication: Updated his spouse at bedside.  Disposition Plan: Admitted to progressive care unit  Consults called: Nephrology, cardiology.  Admission status: Inpatient status.   Status is: Inpatient The patient requires at least 2 midnight for further evaluation and treatment of present condition.   Darlin Drop MD [facility] Pager [phone]  If 7PM-7AM, please contact night-coverage [email or web address] Password [facility]  01/28/2023, 10:35 PM

## 2023-01-15 ENCOUNTER — Inpatient Hospital Stay (HOSPITAL_COMMUNITY): Payer: No Typology Code available for payment source

## 2023-01-15 DIAGNOSIS — J9601 Acute respiratory failure with hypoxia: Secondary | ICD-10-CM | POA: Diagnosis not present

## 2023-01-15 DIAGNOSIS — M3500 Sicca syndrome, unspecified: Secondary | ICD-10-CM

## 2023-01-15 DIAGNOSIS — I214 Non-ST elevation (NSTEMI) myocardial infarction: Secondary | ICD-10-CM

## 2023-01-15 DIAGNOSIS — I2583 Coronary atherosclerosis due to lipid rich plaque: Secondary | ICD-10-CM

## 2023-01-15 DIAGNOSIS — N186 End stage renal disease: Secondary | ICD-10-CM

## 2023-01-15 DIAGNOSIS — I5043 Acute on chronic combined systolic (congestive) and diastolic (congestive) heart failure: Secondary | ICD-10-CM | POA: Diagnosis not present

## 2023-01-15 DIAGNOSIS — Z86718 Personal history of other venous thrombosis and embolism: Secondary | ICD-10-CM

## 2023-01-15 DIAGNOSIS — I2510 Atherosclerotic heart disease of native coronary artery without angina pectoris: Secondary | ICD-10-CM

## 2023-01-15 DIAGNOSIS — I5021 Acute systolic (congestive) heart failure: Secondary | ICD-10-CM

## 2023-01-15 DIAGNOSIS — I10 Essential (primary) hypertension: Secondary | ICD-10-CM

## 2023-01-15 DIAGNOSIS — Z992 Dependence on renal dialysis: Secondary | ICD-10-CM

## 2023-01-15 DIAGNOSIS — D631 Anemia in chronic kidney disease: Secondary | ICD-10-CM

## 2023-01-15 LAB — TROPONIN I (HIGH SENSITIVITY)
Troponin I (High Sensitivity): 1697 ng/L (ref <18)
Troponin I (High Sensitivity): 1709 ng/L (ref <18)
Troponin I (High Sensitivity): 2373 ng/L (ref <18)

## 2023-01-15 LAB — HEPARIN LEVEL (UNFRACTIONATED)
Heparin Unfractionated: 0.2 IU/mL — ABNORMAL LOW (ref 0.30–0.70)
Heparin Unfractionated: <0.1 IU/mL — ABNORMAL LOW (ref 0.30–0.70)
Heparin Unfractionated: <0.1 IU/mL — ABNORMAL LOW (ref 0.30–0.70)

## 2023-01-15 LAB — CBC
HCT: 25.7 % — ABNORMAL LOW (ref 39.0–52.0)
Hemoglobin: 7.8 g/dL — ABNORMAL LOW (ref 13.0–17.0)
MCH: 29.9 pg (ref 26.0–34.0)
MCHC: 30.4 g/dL (ref 30.0–36.0)
MCV: 98.5 fL (ref 80.0–100.0)
Platelets: 218 10*3/uL (ref 150–400)
RBC: 2.61 MIL/uL — ABNORMAL LOW (ref 4.22–5.81)
RDW: 18.1 % — ABNORMAL HIGH (ref 11.5–15.5)
WBC: 16.7 10*3/uL — ABNORMAL HIGH (ref 4.0–10.5)
nRBC: 5.7 % — ABNORMAL HIGH (ref 0.0–0.2)

## 2023-01-15 LAB — ECHOCARDIOGRAM COMPLETE
AR max vel: 1.16 cm2
AV Area VTI: 1.08 cm2
AV Area mean vel: 1.08 cm2
AV Mean grad: 14 mmHg
AV Peak grad: 21.7 mmHg
Ao pk vel: 2.33 m/s
Area-P 1/2: 4.99 cm2
Calc EF: 34.7 %
Height: 70 in
MV M vel: 4.53 m/s
MV Peak grad: 82.1 mmHg
P 1/2 time: 262 msec
Radius: 0.4 cm
S' Lateral: 4.3 cm
Single Plane A2C EF: 29.1 %
Single Plane A4C EF: 36.7 %
Weight: 2640 oz

## 2023-01-15 LAB — RENAL FUNCTION PANEL
Albumin: 3 g/dL — ABNORMAL LOW (ref 3.5–5.0)
Anion gap: 19 — ABNORMAL HIGH (ref 5–15)
BUN: 20 mg/dL (ref 8–23)
CO2: 26 mmol/L (ref 22–32)
Calcium: 8.5 mg/dL — ABNORMAL LOW (ref 8.9–10.3)
Chloride: 90 mmol/L — ABNORMAL LOW (ref 98–111)
Creatinine, Ser: 4.55 mg/dL — ABNORMAL HIGH (ref 0.61–1.24)
GFR, Estimated: 12 mL/min — ABNORMAL LOW (ref >60)
Glucose, Bld: 99 mg/dL (ref 70–99)
Phosphorus: 3.6 mg/dL (ref 2.5–4.6)
Potassium: 3.8 mmol/L (ref 3.5–5.1)
Sodium: 135 mmol/L (ref 135–145)

## 2023-01-15 LAB — APTT
aPTT: 36 seconds (ref 24–36)
aPTT: 38 seconds — ABNORMAL HIGH (ref 24–36)

## 2023-01-15 LAB — MAGNESIUM: Magnesium: 2.1 mg/dL (ref 1.7–2.4)

## 2023-01-15 LAB — LIPID PANEL
Cholesterol: 119 mg/dL (ref 0–200)
HDL: 43 mg/dL (ref >40)
LDL Cholesterol: 61 mg/dL (ref 0–99)
Total CHOL/HDL Ratio: 2.8 RATIO
Triglycerides: 77 mg/dL (ref <150)
VLDL: 15 mg/dL (ref 0–40)

## 2023-01-15 LAB — HEPATITIS B SURFACE ANTIGEN: Hepatitis B Surface Ag: NONREACTIVE

## 2023-01-15 MED ORDER — HEPARIN BOLUS VIA INFUSION
1000.0000 [IU] | Freq: Once | INTRAVENOUS | Status: AC
  Administered 2023-01-15: 1000 [IU] via INTRAVENOUS
  Filled 2023-01-15: qty 1000

## 2023-01-15 MED ORDER — ASPIRIN 81 MG PO TBEC
81.0000 mg | DELAYED_RELEASE_TABLET | Freq: Every day | ORAL | Status: DC
  Administered 2023-01-15 – 2023-01-16 (×2): 81 mg via ORAL
  Filled 2023-01-15 (×2): qty 1

## 2023-01-15 MED ORDER — ATORVASTATIN CALCIUM 40 MG PO TABS
40.0000 mg | ORAL_TABLET | Freq: Every day | ORAL | Status: DC
  Administered 2023-01-15 – 2023-01-17 (×3): 40 mg via ORAL
  Filled 2023-01-15 (×3): qty 1

## 2023-01-15 MED ORDER — HEPARIN BOLUS VIA INFUSION
2200.0000 [IU] | Freq: Once | INTRAVENOUS | Status: AC
  Administered 2023-01-15: 2200 [IU] via INTRAVENOUS
  Filled 2023-01-15: qty 2200

## 2023-01-15 MED ORDER — PERFLUTREN LIPID MICROSPHERE
1.0000 mL | INTRAVENOUS | Status: AC | PRN
  Administered 2023-01-15: 2 mL via INTRAVENOUS

## 2023-01-15 NOTE — Assessment & Plan Note (Signed)
Hgb stable relative to baseline 

## 2023-01-15 NOTE — Assessment & Plan Note (Signed)
-   Per Nephrology.

## 2023-01-15 NOTE — Progress Notes (Signed)
Received patient in bed to unit.  Alert and oriented.  Informed consent signed and in chart.   TX duration: 2 hours  Patient tolerated well.  Transported back to the room  Alert, without acute distress.  Hand-off given to patient's nurse.   Access used: fistula Access issues: none  Total UF removed: 2000 ml Medication(s) given: none Post HD VS: 123/82 Post HD weight: 64.6 kg    01/15/23 0357  Vitals  Temp (!) 97.1 F (36.2 C)  Temp Source Oral  BP 127/80  BP Location Left Arm  BP Method Automatic  Patient Position (if appropriate) Lying  Pulse Rate (!) 113  Pulse Rate Source Monitor  ECG Heart Rate (!) 101  Resp (!) 31  Oxygen Therapy  SpO2 92 %  O2 Device Nasal Cannula  O2 Flow Rate (L/min) 2 L/min  Patient Activity (if Appropriate) In bed  Pulse Oximetry Type Continuous  Post Treatment  Dialyzer Clearance Lightly streaked  Duration of HD Treatment -hour(s) 2 hour(s)  Hemodialysis Intake (mL) 0 mL  Liters Processed 44.4  Fluid Removed (mL) 2000 mL  Tolerated HD Treatment Yes  Post-Hemodialysis Comments HD tx achieved as expected without issues, tolerated well, pt is stable.  AVG/AVF Arterial Site Held (minutes) 0 minutes  AVG/AVF Venous Site Held (minutes) 0 minutes  Fistula / Graft Right Forearm Arteriovenous vein graft  No placement date or time found.   Orientation: Right  Access Location: Forearm  Access Type: Arteriovenous vein graft  Site Condition No complications  Fistula / Graft Assessment Bruit;Present;Thrill  Status Deaccessed  Drainage Description None

## 2023-01-15 NOTE — Progress Notes (Signed)
ANTICOAGULATION CONSULT NOTE  Pharmacy Consult for Heparin Indication: chest pain/ACS  No Known Allergies  Patient Measurements: Height: 5\' 10"  (177.8 cm) Weight: 74.8 kg (165 lb) IBW/kg (Calculated) : 73 Heparin Dosing Weight: 74.8 kg  Vital Signs: Temp: 97.8 F (36.6 C) (05/04 0843) Temp Source: Axillary (05/04 0843) BP: 115/78 (05/04 0843) Pulse Rate: 95 (05/04 0843)  Labs: Recent Labs    02/07/2023 1841 02/07/2023 2052 02/10/2023 2330 01/15/23 0101  HGB 8.0*  --   --   --   HCT 26.2*  --   --   --   PLT 191  --   --   --   APTT  --   --  36  --   HEPARINUNFRC  --   --  <0.10*  --   CREATININE 4.63*  --   --   --   TROPONINIHS 1,211* 1,597* 1,697* 1,709*     Estimated Creatinine Clearance: 12.9 mL/min (A) (by C-G formula based on SCr of 4.63 mg/dL (H)).   Medical History: Past Medical History:  Diagnosis Date   Anemia    Arthritis    Chronic combined systolic and diastolic CHF (congestive heart failure) (HCC)    a. Etiology of low EF not defined.   DVT (deep venous thrombosis) (HCC)    a. 01/2015: RLE DVT. VQ scan intermediate probability. Underwent renal bx complicated by perinephric hematoma; anticoagulation stopped and IVC filter placed.   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 Received plasma exchange and steroids but didn't respond and ended up starting hemodialysis June 2016.    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 100 each 1    albuterol (VENTOLIN HFA) 108 (90 Base) MCG/ACT inhaler Inhale 2 puffs into the lungs every 6 (six) hours as needed for wheezing or shortness of breath.      apixaban (ELIQUIS) 2.5 MG TABS tablet Take 1 tablet (2.5 mg total) by mouth 2 (two) times daily. 60 tablet 0    atorvastatin (LIPITOR) 40 MG tablet Take 1 tablet (40 mg total) by mouth daily at 6 PM. (Patient not taking: Reported on 12/12/2022) 30 tablet 0    Blood Glucose Monitoring Suppl (BLOOD GLUCOSE MONITOR SYSTEM) w/Device KIT Use up to four times daily as directed. 1 kit 1    Brinzolamide-Brimonidine 1-0.2 % SUSP Place 1 drop into both eyes in the morning and at bedtime.      calcitRIOL (ROCALTROL) 0.25 MCG capsule Take 13 capsules (3.25 mcg total) by mouth every Monday, Wednesday, and Friday with hemodialysis.      carvedilol (COREG) 12.5 MG tablet Take 1 tablet (12.5 mg total) by mouth 2 (two) times daily with a meal. 180 tablet 2    cinacalcet (SENSIPAR) 30 MG tablet Take 3 tablets (90 mg  total) by mouth every Monday, Wednesday, and Friday with hemodialysis. 60 tablet     ferric gluconate 62.5 mg in sodium chloride 0.9 % 100 mL Inject 62.5 mg into the vein every Wednesday with hemodialysis.      gabapentin (NEURONTIN) 300 MG capsule Take 300 mg by mouth daily.      glucose blood test strip Use to test blood glucose four times daily as directed 100 each 1    HYDROcodone-acetaminophen (NORCO/VICODIN) 5-325 MG tablet Take 1 tablet by mouth every 4 (four) hours as needed for moderate pain or severe pain. 40 tablet 0    latanoprost (XALATAN) 0.005 % ophthalmic solution Place 1 drop into both eyes at bedtime.      midodrine (PROAMATINE) 10 MG tablet Take 1 tablet (10 mg total) by mouth every Monday, Wednesday, and Friday. Take 10 mg (1 tablet) by mouth before dialysis and 10 mg (1  tablet) at dialysis as directed. 45 tablet 1    multivitamin (RENA-VIT) TABS tablet Take 1 tablet by mouth at bedtime. 90 tablet 0    nitroGLYCERIN (NITROSTAT) 0.4 MG SL tablet Place 1 tablet (0.4 mg total) under the tongue every 5 (five) minutes as needed for chest pain (for total of 3 doses at the most). 10 tablet 1    omeprazole (PRILOSEC) 40 MG capsule Take 40 mg by mouth daily. (Patient not taking: Reported on 12/12/2022)  11    polyethylene glycol powder (MIRALAX) 17 GM/SCOOP powder Take 17 g by mouth daily. Hold for diarrhea (watery stools). 255 g 0    sevelamer carbonate (RENVELA) 800 MG tablet Take 2 tablets (1,600 mg total) by mouth 3 (three) times daily with meals. (Patient taking differently: Take 1,800 mg by mouth 3 (three) times daily with meals.) 90 tablet 0    timolol (TIMOPTIC-XR) 0.5 % ophthalmic gel-forming Place 1 drop into both eyes daily.       Scheduled:   aspirin EC  81 mg Oral Daily   atorvastatin  40 mg Oral Daily   [START ON 01/17/2023] calcitRIOL  3.25 mcg Oral Q M,W,F-HD   Chlorhexidine Gluconate Cloth  6 each Topical Q0600   [START ON 01/17/2023] cinacalcet  90 mg Oral Q M,W,F-HD   [START ON 01/17/2023] midodrine  10 mg Oral Q M,W,F   multivitamin  1 tablet Oral QHS   sevelamer carbonate  1,600 mg Oral TID WC   Infusions:   albumin human     heparin 900 Units/hr (01/15/23 0623)   PRN: albumin human, perflutren lipid microspheres (DEFINITY) IV suspension  Assessment: 82 yom with a history of HTN, lower extremity DVT s/p IVC filter, CAD,Sjogren's disease, recent right hip fx s/p THA, and ESRD. Patient is presenting with SOB. Heparin per pharmacy consult placed for chest pain/ACS. Patient is supposed to be on apixaban prior to arrival, however patient and family member in room state not taking this. .  Baseline aptt level is not elevated, suggesting no recent use of DOAC. Will continue monitoring with heparin levels only. Heparin level this morning is undetectable at  <0.10. Hgb 7.8, plt are WNL. No signs/symptoms of bleeding noted.   Goal of Therapy:  Heparin level 0.3-0.7 units/ml aPTT 66-102 seconds Monitor platelets by anticoagulation protocol: Yes   Plan:  Give heparin 2200 unit bolus x1 Increase heparin infusion to 1150 units/hour Check 8-hour heparin level Monitor daily heparin level, CBC, and signs/symptoms of bleeding  Thank you for involving pharmacy in this patient's care.   Rockwell Alexandria, PharmD PGY1  Pharmacy Resident 01/15/2023 10:10 AM

## 2023-01-15 NOTE — Progress Notes (Signed)
Echocardiogram 2D Echocardiogram has been performed.  Warren Lacy Sheylin Scharnhorst RDCS 01/15/2023, 8:31 AM

## 2023-01-15 NOTE — Consult Note (Signed)
Renal Service Consult Note [facility]  Emad Brockett 01/15/2023 Maree Krabbe, MD Requesting Physician: Dr. Margo Aye  Reason for Consult: ESRD pt admitted with SOB/ hypoxia HPI: The patient is a 82 y.o. year-old w/ PMH as below who presented to his [facility] office to have staples removed from recent R hip surgery. During the visit the pt became acutely dyspneic. They started 2L Standard but couldn't get on SpO2 reading. EMS was activated and pt brought to ED. In ED pt denied CP but did endorse SOB at rest for 2-3 days. In ED pt's HR and RR were high. SpO2 was 99% on RA. BNP was > 4500 and trop was 1211 then 1597, EKG had non-specific changes. CXR showed "prominent bilateral interstitial opacities which could represent pulmonary venous congestion or mild pulmonary edema". EDP called nephrology who planned for HD overnight. EDP d/w cardiology and they recommended IV heparin and full dose asa. The patient was admitted. We are asked to see pt for ESRD.    Pt had 2 hrs HD overnight from about 1a to 3 am. 2 L fluid were removed and BP's were stable. HR's stayed in the low 100s. I saw pt in his hospital bed, he was confused and a bit agitated. Denied any SOB or CP. Hx was not good.   ROS - n/a  Past Medical History  Past Medical History:  Diagnosis Date   Anemia    Arthritis    Chronic combined systolic and diastolic CHF (congestive heart failure) (HCC)    a. Etiology of low EF not defined.   DVT (deep venous thrombosis) (HCC)    a. 01/2015: RLE DVT. VQ scan intermediate probability. Underwent renal bx complicated by perinephric hematoma; anticoagulation stopped and IVC filter placed.   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 Received plasma exchange and steroids but didn't respond and ended up starting hemodialysis June 2016.    Essential hypertension    GERD (gastroesophageal reflux  disease)    History of nuclear stress test    Myoview 9/19 [facility]): low risk    HTN (hypertension)    Hypoalbuminemia    Hypoglycemia    NSTEMI (non-ST elevated myocardial infarction) (HCC) 04/18/2015   OA (osteoarthritis) of knee    Perinephric hematoma 01/2015   Proctitis 07/2015   Protein calorie malnutrition (HCC)    Pulmonary fibrosis (HCC)    Sjogren's disease (HCC) 01/28/2015   Past Surgical History  Past Surgical History:  Procedure Laterality Date   AV FISTULA PLACEMENT Right 02/17/2015   Procedure: INSERTION OF RIGHT ARM  ARTERIOVENOUS (AV) GORE-TEX GRAFT ;  Surgeon: Sherren Kerns, MD;  Location: [facility] OR;  Service: Vascular;  Laterality: Right;   FLEXIBLE SIGMOIDOSCOPY N/A 08/04/2015   Procedure: FLEXIBLE SIGMOIDOSCOPY;  Surgeon: Charlott Rakes, MD;  Location: [facility] ENDOSCOPY;  Service: Endoscopy;  Laterality: N/A;   HEMORROIDECTOMY  1999   INSERTION OF DIALYSIS CATHETER N/A 02/17/2015   Procedure: INSERTION OF DIALYSIS CATHETER RIGHT INTERNAL JUGULAR VEIN;  Surgeon: Sherren Kerns, MD;  Location: [facility] OR;  Service: Vascular;  Laterality: N/A;   MULTIPLE TOOTH EXTRACTIONS     PERIPHERAL VASCULAR BALLOON ANGIOPLASTY Right 01/23/2019   Procedure: PERIPHERAL VASCULAR BALLOON ANGIOPLASTY;  Surgeon: Sherren Kerns, MD;  Location: [facility] INVASIVE CV LAB;  Service: Cardiovascular;  Laterality: Right;   REVISION OF ARTERIOVENOUS GORETEX GRAFT Right 03/29/2017   Procedure: REVISION OF ARTERIOVENOUS  GORETEX GRAFT;  Surgeon: Fransisco Hertz, MD;  Location: [facility] OR;  Service: Vascular;  Laterality: Right;   RIGHT/LEFT HEART CATH AND CORONARY ANGIOGRAPHY N/A 07/06/2022   Procedure: RIGHT/LEFT HEART CATH AND CORONARY ANGIOGRAPHY;  Surgeon: Marykay Lex, MD;  Location: [facility] INVASIVE CV LAB;  Service: Cardiovascular;  Laterality: N/A;   Surgical procedure to remove a mole as a child Right Eye area   At around 25 years old   TOTAL HIP ARTHROPLASTY Right 12/06/2022   Procedure: TOTAL HIP  ARTHROPLASTY ANTERIOR APPROACH;  Surgeon: Samson Frederic, MD;  Location: [facility] OR;  Service: Orthopedics;  Laterality: Right;   Family History  Family History  Problem Relation Age of Onset   Hypertension Mother    Healthy Father    Hypertension Sister    Hypertension Brother    Social History  reports that he has never smoked. He has never used smokeless tobacco. He reports that he does not drink alcohol and does not use drugs. Allergies No Known Allergies Home medications Prior to Admission medications   Medication Sig Start Date End Date Taking? Authorizing Provider  Accu-Chek Softclix Lancets lancets Use to test blood glucose four times daily as directed. 05/26/21   Leroy Sea, MD  albuterol (VENTOLIN HFA) 108 (90 Base) MCG/ACT inhaler Inhale 2 puffs into the lungs every 6 (six) hours as needed for wheezing or shortness of breath. 08/19/22   [provider]  apixaban (ELIQUIS) 2.5 MG TABS tablet Take 1 tablet (2.5 mg total) by mouth 2 (two) times daily. 12/08/22   Clois Dupes, PA-C  atorvastatin (LIPITOR) 40 MG tablet Take 1 tablet (40 mg total) by mouth daily at 6 PM. Patient not taking: Reported on 12/12/2022 09/21/22   Narda Bonds, MD  Blood Glucose Monitoring Suppl (BLOOD GLUCOSE MONITOR SYSTEM) w/Device KIT Use up to four times daily as directed. 05/26/21   Leroy Sea, MD  Brinzolamide-Brimonidine 1-0.2 % SUSP Place 1 drop into both eyes in the morning and at bedtime. 11/03/21   [provider]  calcitRIOL (ROCALTROL) 0.25 MCG capsule Take 13 capsules (3.25 mcg total) by mouth every Monday, Wednesday, and Friday with hemodialysis. 12/25/21   Lonia Blood, MD  carvedilol (COREG) 12.5 MG tablet Take 1 tablet (12.5 mg total) by mouth 2 (two) times daily with a meal. 09/28/22   Meriam Sprague, MD  cinacalcet (SENSIPAR) 30 MG tablet Take 3 tablets (90 mg total) by mouth every Monday, Wednesday, and Friday with hemodialysis. 12/25/21   Lonia Blood, MD  ferric gluconate 62.5 mg in sodium chloride 0.9 % 100 mL Inject 62.5 mg into the vein every Wednesday with hemodialysis. 12/30/21   Lonia Blood, MD  gabapentin (NEURONTIN) 300 MG capsule Take 300 mg by mouth daily.    [provider]  glucose blood test strip Use to test blood glucose four times daily as directed 05/26/21   Leroy Sea, MD  HYDROcodone-acetaminophen (NORCO/VICODIN) 5-325 MG tablet Take 1 tablet by mouth every 4 (four) hours as needed for moderate pain or severe pain. 12/07/22 12/07/23  Clois Dupes, PA-C  latanoprost (XALATAN) 0.005 % ophthalmic solution Place 1 drop into both eyes at bedtime. 05/05/21   [provider]  midodrine (PROAMATINE) 10 MG tablet Take 1 tablet (10 mg total) by mouth every Monday, Wednesday, and Friday. Take 10 mg (1 tablet) by mouth before dialysis and 10 mg (1 tablet) at dialysis as directed. 09/29/22   Meriam Sprague,  MD  multivitamin (RENA-VIT) TABS tablet Take 1 tablet by mouth at bedtime. 12/08/22 03/08/23  Narda Bonds, MD  nitroGLYCERIN (NITROSTAT) 0.4 MG SL tablet Place 1 tablet (0.4 mg total) under the tongue every 5 (five) minutes as needed for chest pain (for total of 3 doses at the most). 07/08/22   Zannie Cove, MD  omeprazole (PRILOSEC) 40 MG capsule Take 40 mg by mouth daily. Patient not taking: Reported on 12/12/2022 07/31/15   [provider]  polyethylene glycol powder (MIRALAX) 17 GM/SCOOP powder Take 17 g by mouth daily. Hold for diarrhea (watery stools). 12/08/22   Narda Bonds, MD  sevelamer carbonate (RENVELA) 800 MG tablet Take 2 tablets (1,600 mg total) by mouth 3 (three) times daily with meals. Patient taking differently: Take 1,800 mg by mouth 3 (three) times daily with meals. 05/17/15   Lonia Blood, MD  timolol (TIMOPTIC-XR) 0.5 % ophthalmic gel-forming Place 1 drop into both eyes daily. 11/03/21   [provider]     Vitals:   01/15/23 1610 01/15/23 0436  01/15/23 0843 01/15/23 1000  BP: 127/80 126/80 115/78   Pulse: (!) 113  95   Resp: (!) 31 (!) 22 18   Temp: (!) 97.1 F (36.2 C) 97.8 F (36.6 C) 97.8 F (36.6 C) 97.9 F (36.6 C)  TempSrc: Oral Oral Axillary Oral  SpO2: 92%  98%   Weight:      Height:       Exam Gen agitated, confused, knows 2024, not sure where he is though Writhing around, no distress though No rash, cyanosis or gangrene Sclera anicteric, throat clear  No jvd or bruits Chest clear bilat to bases, no rales/ wheezing RRR no MRG Abd soft ntnd no mass or ascites +bs GU normal male MS no joint effusions or deformity Ext no LE or UE edema, no wounds or ulcers Neuro is alert, Ox 3 , nf    LFA AVG+bruit      Home meds include - albuterol, eliquis, lipitor, coreg 12.5 bid, gabapentin, norco prn, sl ntg prn, prilosec, midodrine 10mg  pre hd mwf, renavite, renvela 2 ac tid, prns/ vits/ supps     OP HD: [facility] MWF 3.5h  400/1.5   67.4kg   2/2 bath  LFA AVG  Heparin none - last HD 5/03, post wt 66.7kg - rocaltrol 2.5 mcg po tiw - sensipar 150mg  po tiw - mircera 200 mcg IV q 2wks, last 4/24, due 5/08   Assessment/ Plan: Acute hypoxic resp failure - w/ pulm edema on CXR and also ^trops c/w possible NSTEMI. Went for HD overnight w/ 2L off. Is on RA now and not hypoxic. Possibly this is flash pulm edema from nstemi. Exam is normal this am. Will get f/u CXR.  Sharyne Peach - possible NSTEMI, cardiology consulting.  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 Had usual HD yesterday at his OP unit. Then was admitted and had another HD overnight as above.  HTN/ volume - needs good standing wt or good bed weight. No edema on exam this am. BP's are wnl.  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 Follow.  MBD ckd - CCa and phos are in range. Cont renvela as binder w/ sensipar and po vdra.  HFrEF - last echo oct 2023 w/ LVEF 45-50%.  H/o DVT - sp IVC filter. Takes eliquis at home, now on IV heparin gtt H/o hypotension - takes midodrine pre HD on  mwf.     Rob Tax adviser  MD [facility] 01/15/2023, 7:31 PM  Recent Labs  Lab 02/10/2023 1841 01/15/23 0835  HGB 8.0* 7.8*  ALBUMIN  --  3.0*  CALCIUM 8.1* 8.5*  PHOS  --  3.6  CREATININE 4.63* 4.55*  K 3.9 3.8   Inpatient medications:  aspirin EC  81 mg Oral Daily   atorvastatin  40 mg Oral Daily   [START ON 01/17/2023] calcitRIOL  3.25 mcg Oral Q M,W,F-HD   Chlorhexidine Gluconate Cloth  6 each Topical Q0600   [START ON 01/17/2023] cinacalcet  90 mg Oral Q M,W,F-HD   [START ON 01/17/2023] midodrine  10 mg Oral Q M,W,F   multivitamin  1 tablet Oral QHS   sevelamer carbonate  1,600 mg Oral TID WC    albumin human     heparin 1,300 Units/hr (01/15/23 1921)   albumin human

## 2023-01-15 NOTE — Assessment & Plan Note (Signed)
BP soft - Hold Coreg - Continue midodrine

## 2023-01-15 NOTE — Assessment & Plan Note (Signed)
Has IVC filter in place. - Hold home apixaban - Continue heparin gtt

## 2023-01-15 NOTE — Assessment & Plan Note (Signed)
Old diagnosis.  Not currently on disease active medication

## 2023-01-15 NOTE — Progress Notes (Incomplete)
Rounding Note    Patient Name: Travis [name] Date of Encounter: 01/16/2023  [facility] Cardiologist: Meriam Sprague, MD   Subjective   Tired this morning but answering questions appropriately. Denies chest pain or SOB.  TTE with drop in EF 25-30% with multiple WMA; now planned for cath on Monday  Inpatient Medications    Scheduled Meds:  aspirin EC  81 mg Oral Daily   atorvastatin  40 mg Oral Daily   [START ON 01/17/2023] calcitRIOL  3.25 mcg Oral Q M,W,F-HD   Chlorhexidine Gluconate Cloth  6 each Topical Q0600   [START ON 01/17/2023] cinacalcet  90 mg Oral Q M,W,F-HD   [START ON 01/17/2023] midodrine  10 mg Oral Q M,W,F   multivitamin  1 tablet Oral QHS   sevelamer carbonate  1,600 mg Oral TID WC   Continuous Infusions:  albumin human     heparin 1,400 Units/hr (01/16/23 0500)   PRN Meds: albumin human   Vital Signs    Vitals:   01/15/23 2239 01/15/23 2240 01/16/23 0328 01/16/23 0500  BP:  125/87 137/84   Pulse: (!) 105     Resp:   20   Temp:   97.8 F (36.6 C)   TempSrc:   Oral   SpO2: 92%     Weight:   64 kg 64 kg  Height:        Intake/Output Summary (Last 24 hours) at 01/16/2023 0746 Last data filed at 01/15/2023 1736 Gross per 24 hour  Intake 139.32 ml  Output --  Net 139.32 ml      01/16/2023    5:00 AM 01/16/2023    3:28 AM 01/18/2023    4:56 PM  Last 3 Weights  Weight (lbs) 141 lb 141 lb 165 lb  Weight (kg) 63.957 kg 63.957 kg 74.844 kg      Telemetry    SR/Stach HR 100 - Personally Reviewed  ECG    No new tracing - Personally Reviewed  Physical Exam   GEN: Comfortable, NAD Neck: No JVD appreciated Cardiac: RRR, 2/6 systolic murmur Respiratory: Diminished at bases GI: Soft, nontender, non-distended  MS: No edema; No deformity. Neuro:  Fatigued but answering questions and following commands appropriately Psych: Normal affect   Labs    High Sensitivity Troponin:   Recent Labs  Lab 01/31/2023 1841 01/13/2023 2052  01/24/2023 2330 01/15/23 0101 01/15/23 0835  TROPONINIHS 1,211* 1,597* 1,697* 1,709* 2,373*     Chemistry Recent Labs  Lab 02/10/2023 1841 01/15/23 0835 01/16/23 0345  NA 135 135 133*  K 3.9 3.8 4.5  CL 90* 90* 92*  CO2 21* 26 21*  GLUCOSE 109* 99 109*  BUN 16 20 41*  CREATININE 4.63* 4.55* 6.32*  CALCIUM 8.1* 8.5* 8.3*  MG  --  2.1  --   ALBUMIN  --  3.0*  --   GFRNONAA 12* 12* 8*  ANIONGAP 24* 19* 20*    Lipids  Recent Labs  Lab 01/15/23 0835  CHOL 119  TRIG 77  HDL 43  LDLCALC 61  CHOLHDL 2.8    Hematology Recent Labs  Lab 02/03/2023 1841 01/15/23 0835 01/16/23 0345  WBC 15.1* 16.7* 24.9*  RBC 2.63* 2.61* 2.86*  HGB 8.0* 7.8* 8.7*  HCT 26.2* 25.7* 27.7*  MCV 99.6 98.5 96.9  MCH 30.4 29.9 30.4  MCHC 30.5 30.4 31.4  RDW 17.9* 18.1* 19.2*  PLT 191 218 192   Thyroid No results for input(s): "TSH", "FREET4" in the last 168 hours.  BNP Recent Labs  Lab 02/09/2023 1841  BNP >4,500.0*    DDimer No results for input(s): "DDIMER" in the last 168 hours.   Radiology    ECHOCARDIOGRAM COMPLETE  Result Date: 01/15/2023    ECHOCARDIOGRAM REPORT   Patient Name:   Travis [name] Date of Exam: 01/15/2023 Medical Rec #:  [id number]       Height:       70.0 in Accession #:    [id number]      Weight:       165.0 lb Date of Birth:  10-15-1940       BSA:          1.923 m Patient Age:    82 years        BP:           125/83 mmHg Patient Gender: M               HR:           109 bpm. Exam Location:  Inpatient Procedure: 2D Echo, Color Doppler, Cardiac Doppler and Intracardiac            Opacification Agent Indications:    I50.21 Acute systolic (congestive) heart failure  History:        Patient has prior history of Echocardiogram examinations, most                 recent 07/06/2022. CHF, CAD; Risk Factors:Hypertension.  Sonographer:    Irving Burton Senior RDCS Referring Phys: Darlin Drop  Sonographer Comments: Apical window foreshortened due to thin body habitus. IMPRESSIONS  1. Left  ventricular ejection fraction, by estimation, is 25 to 30%. The left ventricle has severely decreased function. The left ventricle demonstrates regional wall motion abnormalities (see scoring diagram/findings for description). The left ventricular internal cavity size was moderately to severely dilated. There is moderate asymmetric left ventricular hypertrophy of the infero-lateral segment. Indeterminate diastolic filling due to E-A fusion.  2. Right ventricular systolic function is low normal. The right ventricular size is mildly enlarged. There is moderately elevated pulmonary artery systolic pressure. The estimated right ventricular systolic pressure is 55.2 mmHg.  3. Left atrial size was severely dilated.  4. Right atrial size was severely dilated.  5. The mitral valve is degenerative. Moderate mitral valve regurgitation.  6. Tricuspid valve regurgitation is moderate to severe.  7. The aortic valve is tricuspid. There is moderate calcification of the aortic valve. There is moderate thickening of the aortic valve. Aortic valve regurgitation is mild to moderate. Likely moderate vs mild to moderate low flow, low gradient AS with AVA 1.1cm2, mean gradient 14, Vmax 2.33, DI 0.37. SVi low at 20.Marland Kitchen Aortic valve mean gradient measures 14.0 mmHg. Aortic valve Vmax measures 2.33 m/s.  8. The inferior vena cava is dilated in size with <50% respiratory variability, suggesting right atrial pressure of 15 mmHg. Comparison(s): Compared to prior TTE on 06/2022, the LVEF has dropped from 45-50% to 25-30% with WMA as described. The MR and TR are worse in the setting of elevated filling pressures. FINDINGS  Left Ventricle: Left ventricular ejection fraction, by estimation, is 25 to 30%. The left ventricle has severely decreased function. The left ventricle demonstrates regional wall motion abnormalities. Definity contrast agent was given IV to delineate the left ventricular endocardial borders. The left ventricular internal cavity  size was moderately to severely dilated. There is moderate asymmetric left ventricular hypertrophy of the infero-lateral segment. Indeterminate diastolic filling due to E-A fusion.  LV Wall  Scoring: The entire anterior wall, mid and distal lateral wall, entire septum, entire inferior wall, and mid anterolateral segment are hypokinetic. The basal inferolateral segment, basal anterolateral segment, and apex are normal. Right Ventricle: The right ventricular size is mildly enlarged. No increase in right ventricular wall thickness. Right ventricular systolic function is low normal. There is moderately elevated pulmonary artery systolic pressure. The tricuspid regurgitant  velocity is 3.17 m/s, and with an assumed right atrial pressure of 15 mmHg, the estimated right ventricular systolic pressure is 55.2 mmHg. Left Atrium: Left atrial size was severely dilated. Right Atrium: Right atrial size was severely dilated. Pericardium: There is no evidence of pericardial effusion. Mitral Valve: The mitral valve is degenerative in appearance. There is mild thickening of the mitral valve leaflet(s). There is mild calcification of the mitral valve leaflet(s). Moderate mitral valve regurgitation. Tricuspid Valve: The tricuspid valve is normal in structure. Tricuspid valve regurgitation is moderate to severe. The flow in the hepatic veins is blunted (decreased) during ventricular systole. Aortic Valve: The aortic valve is tricuspid. There is moderate calcification of the aortic valve. There is moderate thickening of the aortic valve. Aortic valve regurgitation is mild to moderate. Aortic regurgitation PHT measures 262 msec. Likely moderate vs mild to moderate low flow, low gradient AS with AVA 1.1cm2, mean gradient 14, Vmax 2.33, DI 0.37. SVi low at 20. Aortic valve mean gradient measures 14.0 mmHg. Aortic valve peak gradient measures 21.7 mmHg. Aortic valve area, by VTI measures 1.08 cm. Pulmonic Valve: The pulmonic valve was  grossly normal. Pulmonic valve regurgitation is mild. Aorta: The aortic root and ascending aorta are structurally normal, with no evidence of dilitation. Venous: A systolic blunting flow pattern is recorded from the left upper pulmonary vein and the right lower pulmonary vein. The inferior vena cava is dilated in size with less than 50% respiratory variability, suggesting right atrial pressure of 15 mmHg. IAS/Shunts: No atrial level shunt detected by color flow Doppler.  LEFT VENTRICLE PLAX 2D LVIDd:         5.10 cm      Diastology LVIDs:         4.30 cm      LV e' medial:    7.40 cm/s LV PW:         1.40 cm      LV E/e' medial:  16.1 LV IVS:        0.80 cm      LV e' lateral:   11.00 cm/s LVOT diam:     2.00 cm      LV E/e' lateral: 10.8 LV SV:         38 LV SV Index:   20 LVOT Area:     3.14 cm  LV Volumes (MOD) LV vol d, MOD A2C: 196.0 ml LV vol d, MOD A4C: 135.0 ml LV vol s, MOD A2C: 139.0 ml LV vol s, MOD A4C: 85.5 ml LV SV MOD A2C:     57.0 ml LV SV MOD A4C:     135.0 ml LV SV MOD BP:      59.3 ml RIGHT VENTRICLE RV S prime:     9.57 cm/s TAPSE (M-mode): 1.6 cm LEFT ATRIUM              Index        RIGHT ATRIUM           Index LA diam:        3.40 cm  1.77 cm/m   RA  Area:     23.80 cm LA Vol (A2C):   110.0 ml 57.19 ml/m  RA Volume:   84.40 ml  43.88 ml/m LA Vol (A4C):   82.6 ml  42.94 ml/m LA Biplane Vol: 96.1 ml  49.96 ml/m  AORTIC VALVE AV Area (Vmax):    1.16 cm AV Area (Vmean):   1.08 cm AV Area (VTI):     1.08 cm AV Vmax:           233.00 cm/s AV Vmean:          178.000 cm/s AV VTI:            0.352 m AV Peak Grad:      21.7 mmHg AV Mean Grad:      14.0 mmHg LVOT Vmax:         85.90 cm/s LVOT Vmean:        61.300 cm/s LVOT VTI:          0.121 m LVOT/AV VTI ratio: 0.34 AI PHT:            262 msec  AORTA Ao Root diam: 3.20 cm Ao Asc diam:  3.60 cm MITRAL VALVE                  TRICUSPID VALVE MV Area (PHT): 4.99 cm       TR Peak grad:   40.2 mmHg MV Decel Time: 152 msec       TR Vmax:         317.00 cm/s MR Peak grad:    82.1 mmHg MR Mean grad:    55.0 mmHg    SHUNTS MR Vmax:         453.00 cm/s  Systemic VTI:  0.12 m MR Vmean:        354.0 cm/s   Systemic Diam: 2.00 cm MR PISA:         1.01 cm MR PISA Eff ROA: 9 mm MR PISA Radius:  0.40 cm MV E velocity: 119.00 cm/s Laurance Flatten MD Electronically signed by Laurance Flatten MD Signature Date/Time: 01/15/2023/11:16:53 AM    Final    DG Chest Portable 1 View  Result Date: 02/09/2023 CLINICAL DATA:  Shortness of breath EXAM: PORTABLE CHEST 1 VIEW COMPARISON:  CXR 12/10/22 FINDINGS: Cardiomegaly. Trace right pleural effusion. No pneumothorax. No focal airspace opacity. There are prominent bilateral interstitial opacities which could represent pulmonary venous congestion or mild pulmonary edema. Vascular stent projects over the right lung apex. No radiographically apparent displaced rib fractures. Visualized upper abdomen is unremarkable IMPRESSION: Cardiomegaly with trace right pleural effusion and mild pulmonary edema. Electronically Signed   By: Lorenza Cambridge M.D.   On: 02/02/2023 17:51    Cardiac Studies   Cardiac Studies & Procedures   CARDIAC CATHETERIZATION  CARDIAC CATHETERIZATION 07/06/2022  Narrative   Heavily calcified diffusely diseased vessel: 1st Diag-1 lesion is 99% stenosed. 1st Diag-2 lesion is 60% stenosed. 1st Diag-3 lesion is 95% stenosed.   The left ventricular systolic function is normal. The left ventricular ejection fraction is 50-55% by visual estimate.   LV end diastolic pressure is moderately elevated.   Hemodynamic findings consistent with mild pulmonary hypertension.  POSTOP DIAGNOSES Moderate-severe 2-vessel single-vessel disease: Proximal D1-99%, mid 60%, distal 95% (heavily calcified/diffusely diseased; not favorable for PCI would require atherectomy and a relatively small caliber vessel) & 60% ostial sidebranch of OM1 followed by a 60% mid vessel then distal occlusion (with right to left  collaterals). Otherwise mild diffuse disease/calcified  vessels Mostly preserved LVEF, cannot exclude anterolateral hypokinesis. LVEDP moderately elevated at 19 mmHg (LVEDP 166/5 mmHg) with a PCWP of ~22 mmHg. RHC numbers: Mean RAP 6 mmHg, RVP-EDP 51/0-5 mmHg; PAP-mean 50/13-27 mmHg; PCWP 17-26 mmHg (~22 mmHg); Ao sat 99%, PA sat 74%. CO-CI: Fick -> 8.22, 4.44; thermal 5.37-2.7 (suspect that the discrepancy is related to AV fistula)   RECOMMENDATIONS Would recommend medical management for heavily diseased D1 branch. This is heavily calcified vessel with diffuse disease, will require atherectomy and extensive stent placement in the vessel is roughly 2 to 2.25 mm distally.  Not favorable for PCI. Consider slightly increased volume removal and HD based on elevated filling pressures. Return to nursing for ongoing care    Bryan Lemma, MD  Findings Coronary Findings Diagnostic  Dominance: Co-dominant  Left Anterior Descending Vessel is normal in caliber. There is mild diffuse disease throughout the vessel. The vessel is mildly calcified.  First Diagonal Branch Vessel is small in size. There is moderate  diffuse disease in the vessel. Diffuse calcification 1st Diag-1 lesion is 99% stenosed. Vessel is the culprit lesion. The lesion is eccentric. The lesion is severely calcified. 1st Diag-2 lesion is 60% stenosed. The lesion is concentric. The lesion is moderately calcified. 1st Diag-3 lesion is 95% stenosed. The lesion is segmental and concentric. The lesion is moderately calcified.  Lateral First Diagonal Branch Vessel is small in size.  Second Diagonal Branch Vessel is small in size.  Left Circumflex Vessel is normal in caliber and large. The vessel exhibits minimal luminal irregularities.  First Obtuse Marginal Branch Vessel is large in size. The vessel exhibits minimal luminal irregularities.  Lateral First Obtuse Marginal Branch Vessel is small in size.  Second Obtuse  Marginal Branch Vessel is small in size.  First Left Posterolateral Branch Vessel is small in size.  Second Left Posterolateral Branch Vessel is small in size.  Third Left Posterolateral Branch Vessel is small in size.  Left Posterior Atrioventricular Artery Vessel is large in size.  Right Coronary Artery Vessel was injected. Vessel is small. There is mild diffuse disease throughout the vessel. The vessel is mildly calcified.  Acute Marginal Branch Vessel is small in size.  Right Ventricular Branch  Right Posterior Descending Artery Vessel is small in size.  First Right Posterolateral Branch Vessel is moderate in size.  Intervention  No interventions have been documented.   STRESS TESTS  MYOCARDIAL PERFUSION IMAGING 01/12/2021   ECHOCARDIOGRAM  ECHOCARDIOGRAM COMPLETE 01/15/2023  Narrative ECHOCARDIOGRAM REPORT    Patient Name:   Ryelin Chaparro Date of Exam: 01/15/2023 Medical Rec #:  [id number]       Height:       70.0 in Accession #:    [id number]      Weight:       165.0 lb Date of Birth:  Mar 13, 1941       BSA:          1.923 m Patient Age:    82 years        BP:           125/83 mmHg Patient Gender: M               HR:           109 bpm. Exam Location:  Inpatient  Procedure: 2D Echo, Color Doppler, Cardiac Doppler and Intracardiac Opacification Agent  Indications:    I50.21 Acute systolic (congestive) heart failure  History:        Patient has prior history  of Echocardiogram examinations, most recent 07/06/2022. CHF, CAD; Risk Factors:Hypertension.  Sonographer:    Irving Burton Senior RDCS Referring Phys: Darlin Drop   Sonographer Comments: Apical window foreshortened due to thin body habitus. IMPRESSIONS   1. Left ventricular ejection fraction, by estimation, is 25 to 30%. The left ventricle has severely decreased function. The left ventricle demonstrates regional wall motion abnormalities (see scoring diagram/findings for description). The  left ventricular internal cavity size was moderately to severely dilated. There is moderate asymmetric left ventricular hypertrophy of the infero-lateral segment. Indeterminate diastolic filling due to E-A fusion. 2. Right ventricular systolic function is low normal. The right ventricular size is mildly enlarged. There is moderately elevated pulmonary artery systolic pressure. The estimated right ventricular systolic pressure is 55.2 mmHg. 3. Left atrial size was severely dilated. 4. Right atrial size was severely dilated. 5. The mitral valve is degenerative. Moderate mitral valve regurgitation. 6. Tricuspid valve regurgitation is moderate to severe. 7. The aortic valve is tricuspid. There is moderate calcification of the aortic valve. There is moderate thickening of the aortic valve. Aortic valve regurgitation is mild to moderate. Likely moderate vs mild to moderate low flow, low gradient AS with AVA 1.1cm2, mean gradient 14, Vmax 2.33, DI 0.37. SVi low at 20.Marland Kitchen Aortic valve mean gradient measures 14.0 mmHg. Aortic valve Vmax measures 2.33 m/s. 8. The inferior vena cava is dilated in size with <50% respiratory variability, suggesting right atrial pressure of 15 mmHg.  Comparison(s): Compared to prior TTE on 06/2022, the LVEF has dropped from 45-50% to 25-30% with WMA as described. The MR and TR are worse in the setting of elevated filling pressures.  FINDINGS Left Ventricle: Left ventricular ejection fraction, by estimation, is 25 to 30%. The left ventricle has severely decreased function. The left ventricle demonstrates regional wall motion abnormalities. Definity contrast agent was given IV to delineate the left ventricular endocardial borders. The left ventricular internal cavity size was moderately to severely dilated. There is moderate asymmetric left ventricular hypertrophy of the infero-lateral segment. Indeterminate diastolic filling due to E-A fusion.   LV Wall Scoring: The entire  anterior wall, mid and distal lateral wall, entire septum, entire inferior wall, and mid anterolateral segment are hypokinetic. The basal inferolateral segment, basal anterolateral segment, and apex are normal.  Right Ventricle: The right ventricular size is mildly enlarged. No increase in right ventricular wall thickness. Right ventricular systolic function is low normal. There is moderately elevated pulmonary artery systolic pressure. The tricuspid regurgitant velocity is 3.17 m/s, and with an assumed right atrial pressure of 15 mmHg, the estimated right ventricular systolic pressure is 55.2 mmHg.  Left Atrium: Left atrial size was severely dilated.  Right Atrium: Right atrial size was severely dilated.  Pericardium: There is no evidence of pericardial effusion.  Mitral Valve: The mitral valve is degenerative in appearance. There is mild thickening of the mitral valve leaflet(s). There is mild calcification of the mitral valve leaflet(s). Moderate mitral valve regurgitation.  Tricuspid Valve: The tricuspid valve is normal in structure. Tricuspid valve regurgitation is moderate to severe. The flow in the hepatic veins is blunted (decreased) during ventricular systole.  Aortic Valve: The aortic valve is tricuspid. There is moderate calcification of the aortic valve. There is moderate thickening of the aortic valve. Aortic valve regurgitation is mild to moderate. Aortic regurgitation PHT measures 262 msec. Likely moderate vs mild to moderate low flow, low gradient AS with AVA 1.1cm2, mean gradient 14, Vmax 2.33, DI 0.37. SVi low at 20. Aortic  valve mean gradient measures 14.0 mmHg. Aortic valve peak gradient measures 21.7 mmHg. Aortic valve area, by VTI measures 1.08 cm.  Pulmonic Valve: The pulmonic valve was grossly normal. Pulmonic valve regurgitation is mild.  Aorta: The aortic root and ascending aorta are structurally normal, with no evidence of dilitation.  Venous: A systolic blunting  flow pattern is recorded from the left upper pulmonary vein and the right lower pulmonary vein. The inferior vena cava is dilated in size with less than 50% respiratory variability, suggesting right atrial pressure of 15 mmHg.  IAS/Shunts: No atrial level shunt detected by color flow Doppler.   LEFT VENTRICLE PLAX 2D LVIDd:         5.10 cm      Diastology LVIDs:         4.30 cm      LV e' medial:    7.40 cm/s LV PW:         1.40 cm      LV E/e' medial:  16.1 LV IVS:        0.80 cm      LV e' lateral:   11.00 cm/s LVOT diam:     2.00 cm      LV E/e' lateral: 10.8 LV SV:         38 LV SV Index:   20 LVOT Area:     3.14 cm  LV Volumes (MOD) LV vol d, MOD A2C: 196.0 ml LV vol d, MOD A4C: 135.0 ml LV vol s, MOD A2C: 139.0 ml LV vol s, MOD A4C: 85.5 ml LV SV MOD A2C:     57.0 ml LV SV MOD A4C:     135.0 ml LV SV MOD BP:      59.3 ml  RIGHT VENTRICLE RV S prime:     9.57 cm/s TAPSE (M-mode): 1.6 cm  LEFT ATRIUM              Index        RIGHT ATRIUM           Index LA diam:        3.40 cm  1.77 cm/m   RA Area:     23.80 cm LA Vol (A2C):   110.0 ml 57.19 ml/m  RA Volume:   84.40 ml  43.88 ml/m LA Vol (A4C):   82.6 ml  42.94 ml/m LA Biplane Vol: 96.1 ml  49.96 ml/m AORTIC VALVE AV Area (Vmax):    1.16 cm AV Area (Vmean):   1.08 cm AV Area (VTI):     1.08 cm AV Vmax:           233.00 cm/s AV Vmean:          178.000 cm/s AV VTI:            0.352 m AV Peak Grad:      21.7 mmHg AV Mean Grad:      14.0 mmHg LVOT Vmax:         85.90 cm/s LVOT Vmean:        61.300 cm/s LVOT VTI:          0.121 m LVOT/AV VTI ratio: 0.34 AI PHT:            262 msec  AORTA Ao Root diam: 3.20 cm Ao Asc diam:  3.60 cm  MITRAL VALVE                  TRICUSPID VALVE MV Area (PHT): 4.99 cm  TR Peak grad:   40.2 mmHg MV Decel Time: 152 msec       TR Vmax:        317.00 cm/s MR Peak grad:    82.1 mmHg MR Mean grad:    55.0 mmHg    SHUNTS MR Vmax:         453.00 cm/s  Systemic VTI:   0.12 m MR Vmean:        354.0 cm/s   Systemic Diam: 2.00 cm MR PISA:         1.01 cm MR PISA Eff ROA: 9 mm MR PISA Radius:  0.40 cm MV E velocity: 119.00 cm/s  Laurance Flatten MD Electronically signed by Laurance Flatten MD Signature Date/Time: 01/15/2023/11:16:53 AM    Final              Patient Profile     82 y.o. 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, multivessel CAD, and history of DVT who presented to the ER with chest pain and SOB found to have elevated trop  for which Cardiology was consulted  Assessment & Plan    #NSTEMI: #Known Multivessel CAD: -Patient presented with worsening dyspnea and chest pain found to have trop [phone] and BNP >4500 from 366 on prior labs -Cath 06/2022 with 99% D1, 60% D2, 95% D3 with heavily calcified/diffuse disease that was managed medically  -TTE with EF 25-30%, severe septal, mid-to-apical lateral, mid-to-apical anterior and mid-to- apical inferior hypokinesis with relatively preserved LV apex systolic motion, moderate MR, moderate to severe TR, mild to mod AS, mild to moderate AR -Given drop in EF with multiple WMA as described above, will plan for cath on Monday>keep NPO after MN -Will continue heparin gtt  -Continue ASA 81mg  daily -Continue lipitor 40mg  daily  #Acute on Chronic Systolic HF Exacerbation: -BNP >4500 (from 365 in 11/2022) on admission with pulmonary edema on CXR consistent with acute on chronic systolic HF exacerbation -On volume management with HD  #Acute on Chronic Hypoxic Respiratory Failure: -Due to acute on chronic systolic and diastolic HF exacerbation as above -Volume management with HD  #Rising Leukocytosis: -? If becoming septic -Not hypotensive but degree of leukocytosis is very concerning -Infectious work-up per primary  #HTN: -Holding nodal agents for now;   #ESRD: -Management per Nephrology  #HLD: -Continue lipitor 40mg  daily     INFORMED CONSENT: I have reviewed the risks, indications, and alternatives to cardiac catheterization, possible angioplasty, and stenting with the patient. Risks include but are not limited to bleeding, infection, vascular injury, stroke, myocardial infection, arrhythmia, kidney injury, radiation-related injury in the case of prolonged fluoroscopy use, emergency cardiac surgery, and death. The patient understands the risks of serious complication is 1-2 in 1000 with diagnostic cardiac cath and 1-2% or less with angioplasty/stenting.       ADDENDUM Please see separate progress note from today: Lactate rising to 9. Plan to be transferred to ICU for concern for cardiogenic vs septic vs mixed shock. Plan for line placement with co-ox and CVP monitoring. Family updated and wish to pursue full scope of care.   For questions or updates, please contact [facility] Please consult [email or web address] for contact info under        Signed, Meriam Sprague, MD  01/16/2023, 7:46 AM

## 2023-01-15 NOTE — Progress Notes (Addendum)
Rounding Note    Patient Name: Travis [name] Date of Encounter: 01/15/2023  [facility] Cardiologist: Meriam Sprague, MD   Subjective   Somnolent this morning. Denies chest pain. Breathing better after HD.  2L removed with HD last night  Inpatient Medications    Scheduled Meds:  [START ON 01/17/2023] calcitRIOL  3.25 mcg Oral Q M,W,F-HD   Chlorhexidine Gluconate Cloth  6 each Topical Q0600   [START ON 01/17/2023] cinacalcet  90 mg Oral Q M,W,F-HD   [START ON 01/17/2023] midodrine  10 mg Oral Q M,W,F   multivitamin  1 tablet Oral QHS   sevelamer carbonate  1,600 mg Oral TID WC   Continuous Infusions:  albumin human     heparin 900 Units/hr (01/15/23 0623)   PRN Meds: albumin human   Vital Signs    Vitals:   01/15/23 0332 01/15/23 0347 01/15/23 0357 01/15/23 0436  BP: 128/82 123/73 127/80 126/80  Pulse: (!) 101 (!) 104 (!) 113   Resp: (!) 29 (!) 33 (!) 31 (!) 22  Temp:  (!) 97.1 F (36.2 C) (!) 97.1 F (36.2 C) 97.8 F (36.6 C)  TempSrc:  Oral Oral Oral  SpO2: 100% 100% 92%   Weight:      Height:        Intake/Output Summary (Last 24 hours) at 01/15/2023 0750 Last data filed at 01/15/2023 1308 Gross per 24 hour  Intake 57.54 ml  Output 2000 ml  Net -1942.46 ml      01/16/2023    4:56 PM 12/15/2022    7:41 PM 12/15/2022    2:35 PM  Last 3 Weights  Weight (lbs) 165 lb 146 lb 13.2 oz 153 lb 10.6 oz  Weight (kg) 74.844 kg 66.6 kg 69.7 kg      Telemetry    Sinus tach with PVCs - Personally Reviewed  ECG    ST with IVCD, LVH, inferolateral STD - Personally Reviewed  Physical Exam   GEN: Comfortable, NAD Neck: JVD to mid neck Cardiac: Tachycardic, regular, 2/6 systolic murmur Respiratory: Bibasilar crackles GI: Soft, nontender, non-distended  MS: No edema; No deformity. Neuro:  Nonfocal  Psych: Normal affect   Labs    High Sensitivity Troponin:   Recent Labs  Lab 01/27/2023 1841 02/07/2023 2052 01/15/2023 2330 01/15/23 0101   TROPONINIHS 1,211* 1,597* 1,697* 1,709*     Chemistry Recent Labs  Lab 01/23/2023 1841  NA 135  K 3.9  CL 90*  CO2 21*  GLUCOSE 109*  BUN 16  CREATININE 4.63*  CALCIUM 8.1*  GFRNONAA 12*  ANIONGAP 24*    Lipids No results for input(s): "CHOL", "TRIG", "HDL", "LABVLDL", "LDLCALC", "CHOLHDL" in the last 168 hours.  Hematology Recent Labs  Lab 02/11/2023 1841  WBC 15.1*  RBC 2.63*  HGB 8.0*  HCT 26.2*  MCV 99.6  MCH 30.4  MCHC 30.5  RDW 17.9*  PLT 191   Thyroid No results for input(s): "TSH", "FREET4" in the last 168 hours.  BNP Recent Labs  Lab 01/24/2023 1841  BNP >4,500.0*    DDimer No results for input(s): "DDIMER" in the last 168 hours.   Radiology    DG Chest Portable 1 View  Result Date: 02/07/2023 CLINICAL DATA:  Shortness of breath EXAM: PORTABLE CHEST 1 VIEW COMPARISON:  CXR 12/10/22 FINDINGS: Cardiomegaly. Trace right pleural effusion. No pneumothorax. No focal airspace opacity. There are prominent bilateral interstitial opacities which could represent pulmonary venous congestion or mild pulmonary edema. Vascular stent projects over the  right lung apex. No radiographically apparent displaced rib fractures. Visualized upper abdomen is unremarkable IMPRESSION: Cardiomegaly with trace right pleural effusion and mild pulmonary edema. Electronically Signed   By: Lorenza Cambridge M.D.   On: 02/06/2023 17:51    Cardiac Studies   Cardiac Studies & Procedures   CARDIAC CATHETERIZATION  CARDIAC CATHETERIZATION 07/06/2022  Narrative   Heavily calcified diffusely diseased vessel: 1st Diag-1 lesion is 99% stenosed. 1st Diag-2 lesion is 60% stenosed. 1st Diag-3 lesion is 95% stenosed.   The left ventricular systolic function is normal. The left ventricular ejection fraction is 50-55% by visual estimate.   LV end diastolic pressure is moderately elevated.   Hemodynamic findings consistent with mild pulmonary hypertension.  POSTOP DIAGNOSES Moderate-severe 2-vessel  single-vessel disease: Proximal D1-99%, mid 60%, distal 95% (heavily calcified/diffusely diseased; not favorable for PCI would require atherectomy and a relatively small caliber vessel) & 60% ostial sidebranch of OM1 followed by a 60% mid vessel then distal occlusion (with right to left collaterals). Otherwise mild diffuse disease/calcified vessels Mostly preserved LVEF, cannot exclude anterolateral hypokinesis. LVEDP moderately elevated at 19 mmHg (LVEDP 166/5 mmHg) with a PCWP of ~22 mmHg. RHC numbers: Mean RAP 6 mmHg, RVP-EDP 51/0-5 mmHg; PAP-mean 50/13-27 mmHg; PCWP 17-26 mmHg (~22 mmHg); Ao sat 99%, PA sat 74%. CO-CI: Fick -> 8.22, 4.44; thermal 5.37-2.7 (suspect that the discrepancy is related to AV fistula)   RECOMMENDATIONS Would recommend medical management for heavily diseased D1 branch. This is heavily calcified vessel with diffuse disease, will require atherectomy and extensive stent placement in the vessel is roughly 2 to 2.25 mm distally.  Not favorable for PCI. Consider slightly increased volume removal and HD based on elevated filling pressures. Return to nursing for ongoing care    Bryan Lemma, MD  Findings Coronary Findings Diagnostic  Dominance: Co-dominant  Left Anterior Descending Vessel is normal in caliber. There is mild diffuse disease throughout the vessel. The vessel is mildly calcified.  First Diagonal Branch Vessel is small in size. There is moderate  diffuse disease in the vessel. Diffuse calcification 1st Diag-1 lesion is 99% stenosed. Vessel is the culprit lesion. The lesion is eccentric. The lesion is severely calcified. 1st Diag-2 lesion is 60% stenosed. The lesion is concentric. The lesion is moderately calcified. 1st Diag-3 lesion is 95% stenosed. The lesion is segmental and concentric. The lesion is moderately calcified.  Lateral First Diagonal Branch Vessel is small in size.  Second Diagonal Branch Vessel is small in size.  Left  Circumflex Vessel is normal in caliber and large. The vessel exhibits minimal luminal irregularities.  First Obtuse Marginal Branch Vessel is large in size. The vessel exhibits minimal luminal irregularities.  Lateral First Obtuse Marginal Branch Vessel is small in size.  Second Obtuse Marginal Branch Vessel is small in size.  First Left Posterolateral Branch Vessel is small in size.  Second Left Posterolateral Branch Vessel is small in size.  Third Left Posterolateral Branch Vessel is small in size.  Left Posterior Atrioventricular Artery Vessel is large in size.  Right Coronary Artery Vessel was injected. Vessel is small. There is mild diffuse disease throughout the vessel. The vessel is mildly calcified.  Acute Marginal Branch Vessel is small in size.  Right Ventricular Branch  Right Posterior Descending Artery Vessel is small in size.  First Right Posterolateral Branch Vessel is moderate in size.  Intervention  No interventions have been documented.   STRESS TESTS  MYOCARDIAL PERFUSION IMAGING 01/12/2021   ECHOCARDIOGRAM  ECHOCARDIOGRAM COMPLETE 07/06/2022  Narrative ECHOCARDIOGRAM REPORT    Patient Name:   MALEKO PATE Date of Exam: 07/06/2022 Medical Rec #:  [id number]       Height:       70.0 in Accession #:    [id number]      Weight:       154.3 lb Date of Birth:  1941-04-20       BSA:          1.870 m Patient Age:    81 years        BP:           147/76 mmHg Patient Gender: M               HR:           66 bpm. Exam Location:  Inpatient  Procedure: 2D Echo, 3D Echo, Cardiac Doppler and Color Doppler  Indications:     NSTEMI I21.4  History:         Patient has prior history of Echocardiogram examinations, most recent 11/11/2020. CHF, NSTEMI, Signs/Symptoms:Chest Pain; Risk Factors:Hypertension and Non-Smoker.  Sonographer:     Aron Baba Referring Phys:  [id number] Emeline General Diagnosing Phys: Lennie Odor MD   Sonographer Comments:  Suboptimal subcostal window. Image acquisition challenging due to respiratory motion. IMPRESSIONS   1. Left ventricular ejection fraction, by estimation, is 45 to 50%. Left ventricular ejection fraction by 2D MOD biplane is 47.4 %. The left ventricle has mildly decreased function. The left ventricle has no regional wall motion abnormalities. There is moderate asymmetric left ventricular hypertrophy of the infero-lateral segment. Left ventricular diastolic parameters are consistent with Grade II diastolic dysfunction (pseudonormalization). 2. Right ventricular systolic function is normal. The right ventricular size is normal. Tricuspid regurgitation signal is inadequate for assessing PA pressure. 3. The mitral valve is grossly normal. Mild mitral valve regurgitation. No evidence of mitral stenosis. 4. The aortic valve is tricuspid. There is moderate calcification of the aortic valve. There is moderate thickening of the aortic valve. Aortic valve regurgitation is mild. Mild aortic valve stenosis. 5. The inferior vena cava is normal in size with greater than 50% respiratory variability, suggesting right atrial pressure of 3 mmHg.  Comparison(s): Changes from prior study are noted. The left ventricular function is worsened.  FINDINGS Left Ventricle: Left ventricular ejection fraction, by estimation, is 45 to 50%. Left ventricular ejection fraction by 2D MOD biplane is 47.4 %. The left ventricle has mildly decreased function. The left ventricle has no regional wall motion abnormalities. 3D left ventricular ejection fraction analysis performed but not reported based on interpreter judgement due to suboptimal tracking. The left ventricular internal cavity size was normal in size. There is moderate asymmetric left ventricular hypertrophy of the infero-lateral segment. Left ventricular diastolic parameters are consistent with Grade II diastolic dysfunction (pseudonormalization).  Right Ventricle: The right  ventricular size is normal. No increase in right ventricular wall thickness. Right ventricular systolic function is normal. Tricuspid regurgitation signal is inadequate for assessing PA pressure.  Left Atrium: Left atrial size was normal in size.  Right Atrium: Right atrial size was normal in size.  Pericardium: There is no evidence of pericardial effusion.  Mitral Valve: The mitral valve is grossly normal. Mild mitral valve regurgitation. No evidence of mitral valve stenosis.  Tricuspid Valve: The tricuspid valve is grossly normal. Tricuspid valve regurgitation is mild . No evidence of tricuspid stenosis.  Aortic Valve: The aortic valve is tricuspid. There is moderate calcification of the aortic valve. There  is moderate thickening of the aortic valve. Aortic valve regurgitation is mild. Aortic regurgitation PHT measures 460 msec. Mild aortic stenosis is present. Aortic valve mean gradient measures 15.0 mmHg. Aortic valve peak gradient measures 27.2 mmHg. Aortic valve area, by VTI measures 1.11 cm.  Pulmonic Valve: The pulmonic valve was grossly normal. Pulmonic valve regurgitation is trivial. No evidence of pulmonic stenosis.  Aorta: The aortic root and ascending aorta are structurally normal, with no evidence of dilitation.  Venous: The inferior vena cava is normal in size with greater than 50% respiratory variability, suggesting right atrial pressure of 3 mmHg.  IAS/Shunts: The atrial septum is grossly normal.   LEFT VENTRICLE PLAX 2D                        Biplane EF (MOD) LVIDd:         5.10 cm         LV Biplane EF:   Left LVIDs:         3.90 cm                          ventricular LV PW:         1.80 cm                          ejection LV IVS:        0.60 cm                          fraction by LVOT diam:     1.80 cm                          2D MOD LV SV:         61                               biplane is LV SV Index:   33                               47.4 %. LVOT Area:      2.54 cm Diastology LV e' medial:    4.57 cm/s LV Volumes (MOD)               LV E/e' medial:  26.9 LV vol d, MOD    121.0 ml      LV e' lateral:   5.98 cm/s A2C:                           LV E/e' lateral: 20.6 LV vol d, MOD    99.0 ml A4C: LV vol s, MOD    64.6 ml A2C: LV vol s, MOD    53.5 ml       3D Volume EF: A4C:                           3D EF:        53 % LV SV MOD A2C:   56.4 ml       LV EDV:       207 ml LV SV MOD A4C:   99.0 ml  LV ESV:       97 ml LV SV MOD BP:    52.7 ml       LV SV:        110 ml  RIGHT VENTRICLE RV S prime:     8.92 cm/s TAPSE (M-mode): 2.0 cm  LEFT ATRIUM             Index        RIGHT ATRIUM           Index LA diam:        3.60 cm 1.93 cm/m   RA Area:     17.50 cm LA Vol (A2C):   87.0 ml 46.54 ml/m  RA Volume:   46.90 ml  25.09 ml/m LA Vol (A4C):   32.3 ml 17.28 ml/m LA Biplane Vol: 52.7 ml 28.19 ml/m AORTIC VALVE                     PULMONIC VALVE AV Area (Vmax):    1.13 cm      PR End Diast Vel: 5.02 msec AV Area (Vmean):   1.16 cm AV Area (VTI):     1.11 cm AV Vmax:           261.00 cm/s AV Vmean:          182.500 cm/s AV VTI:            0.546 m AV Peak Grad:      27.2 mmHg AV Mean Grad:      15.0 mmHg LVOT Vmax:         116.00 cm/s LVOT Vmean:        83.300 cm/s LVOT VTI:          0.239 m LVOT/AV VTI ratio: 0.44 AI PHT:            460 msec  AORTA Ao Root diam: 3.90 cm Ao Asc diam:  3.70 cm  MITRAL VALVE MV Area (PHT): 3.60 cm     SHUNTS MV Decel Time: 211 msec     Systemic VTI:  0.24 m MV E velocity: 123.00 cm/s  Systemic Diam: 1.80 cm MV A velocity: 75.00 cm/s MV E/A ratio:  1.64  Lennie Odor MD Electronically signed by Lennie Odor MD Signature Date/Time: 07/06/2022/6:51:56 PM    Final (Updated)              Patient Profile     82 y.o. 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, multivessel CAD, and history of DVT who presented to the ER with  chest pain and SOB found to have trop [phone] for which Cardiology was consulted  Assessment & Plan    #NSTEMI: #Known Multivessel CAD: -Patient presented with worsening dyspnea and chest pain found to have trop [phone] and BNP >4500 from 366 on prior labs -Cath 06/2022 with 99% D1, 60% D2, 95% D3 with heavily calcified/diffuse disease that was managed medically  -TTE briefly reviewed at beside with EF 25-30%, severe septal, mid-to-apical lateral, mid-to-apical anterior and mid-to- apical inferior hypokinesis with relatively preserved LV apex systolic motion, moderate to severe MR, moderate to severe TR, mild AS, mild to moderate AR -Given drop in EF with multiple WMA as described above, will plan for cath on Monday -Will continue heparin gtt  -Nephrology consulted for HD for volume management -Continue ASA 81mg  daily  #Acute on Chronic Systolic HF Exacerbation: -BNP >4500 (from 365 in 11/2022) on admission with  pulmonary edema on CXR consistent with acute on chronic systolic HF exacerbation -On volume management with HD -Will resume BB as volume status continues to improve pending blood pressure  #Acute on Chronic Hypoxic Respiratory Failure: -Due to acute on chronic systolic and diastolic HF exacerbation as above -Volume management with HD  #HTN: -Normotensive off meds right now -Will resume BB as able as above  #ESRD: -Management per Nephrology  #HLD: -Continue lipitor 40mg  daily        For questions or updates, please contact [facility] Please consult [email or web address] for contact info under        Signed, Meriam Sprague, MD  01/15/2023, 7:50 AM

## 2023-01-15 NOTE — Progress Notes (Signed)
  Progress Note   Patient: Travis [name] ACZ:[id number] DOB: 09-22-1940 DOA: 01/19/2023     1 DOS: the patient was seen and examined on 01/15/2023 at 10:42 AM and 12:30 PM      Brief hospital course: Mr. Travis [name] is an 82 y.o. M with ESRD on HD MWF, sdCHF EF 35-60% over last 10y, Sjogren's disease and some IPF not currently on disease specific therapy, hx DVT with IVC who presented with SOB from clinic found to have fluid overload.   5/3[name] to clinic for staple removal after hip surgery, found to be hypoxic --> Cardiology and Nephrology consulted in ER 5/4: Echo shows EF down to 25-30%, with RWMA; heparin started     Assessment and Plan: * Acute on chronic combined systolic and diastolic CHF (congestive heart failure) (HCC) Presented with hypoxia to 83%, CXR showing effusions, pulmonary edema.  BNP >4500 from prior 300s.  Respiratory failure ruled out. HD yesterday, net negative -1.9L - Volume removal by HD - Consult Nephrology and Cardiology, appreciate expertise - Beta-blocker per Cardiology - Continue midodrine    NSTEMI (non-ST elevated myocardial infarction) (HCC) Sinus tachycardia Troponin up to 1000s and echo with RWMA, worsening cardiomyopathy. 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 - Hold home apixaban - Continue heparin gtt  Coronary artery disease See above  Anemia in chronic kidney disease Hgb stable relative to baseline  HTN (hypertension) BP soft - Hold Coreg - Continue midodrine  Sjogren's disease (HCC) Not currently on disease active medication  Acute metabolic encephaloatphy Appears to be delirium from CHF, NSTEMI. - Standard delirium precautions: blinds open and lights on during day, TV off, minimize  interruptions at night, glasses/hearing aids, PT/OT, avoiding Beers list medications          Subjective: Patient is somewhat rambling and confused.  No pain complaints.  No dyspnea or respiratory distress.        Physical Exam: BP 115/78 (BP Location: Left Arm)   Pulse 95   Temp 97.9 F (36.6 C) (Oral)   Resp 18   Ht 5\' 10"  (1.778 m)   Wt 74.8 kg   SpO2 98%   BMI 23.68 kg/m   Adult male, weak and tired.   Tachycardic, regular, no murmurs, minimla peripheral edema RR elevated, lung sounds diminished. Abdomen soft wtihout tenderness to palpation.   Inattentive, ramlbling.  Oriented to slef, family and place but not month or situation.  Upper extremiy strength weak but symmetric, face symmetric, speech fluent.   Data Reviewed: Echo shows reduced EF BMP unremarkable for post-HD Hgb 8, stable Troponin trendin gup BNP elevated    Family Communication: Wife and brothe rin law at bedside    Disposition: Status is: Inpatient         Author: Alberteen Sam, MD 01/15/2023 2:13 PM  For on call review [email or web address].

## 2023-01-15 NOTE — Assessment & Plan Note (Addendum)
Trop elevated on admission, echo with RWMA, worsening cardiomyopathy.  Suspected to be NSTEMI.   - Heparin gtt per Cardiology - Continue aspirin, Lipitor for now - BP precludes BB - Defer timing of cath to Cardiology given new shock

## 2023-01-15 NOTE — Assessment & Plan Note (Signed)
See above

## 2023-01-15 NOTE — Hospital Course (Addendum)
Mr. Travis [name] is an 82 y.o. 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   5/3[name] to clinic for staple removal after hip surgery, found to be hypoxic --> admitted with working diagnosis CHF; Cardiology and Nephrology consulted in ER 5/4: Echo w/ EF down to 25-30%, RWMA; heparin started 5/5: Hypoglycemic, WBC up to 24K --> empiric Abx started, found to have lactate >9 --> transferred to ICU

## 2023-01-15 NOTE — Progress Notes (Signed)
ANTICOAGULATION CONSULT NOTE  Pharmacy Consult for Heparin Indication: chest pain/ACS  No Known Allergies  Patient Measurements: Height: 5\' 10"  (177.8 cm) Weight: 74.8 kg (165 lb) IBW/kg (Calculated) : 73 Heparin Dosing Weight: 74.8 kg  Vital Signs: Temp: 97.9 F (36.6 C) (05/04 1000) Temp Source: Oral (05/04 1000) BP: 115/78 (05/04 0843) Pulse Rate: 95 (05/04 0843)  Labs: Recent Labs    01/18/2023 1841 01/27/2023 2052 01/16/2023 2330 01/15/23 0101 01/15/23 0835 01/15/23 1809  HGB 8.0*  --   --   --  7.8*  --   HCT 26.2*  --   --   --  25.7*  --   PLT 191  --   --   --  218  --   APTT  --   --  36  --  38*  --   HEPARINUNFRC  --   --  <0.10*  --  <0.10* 0.20*  CREATININE 4.63*  --   --   --  4.55*  --   TROPONINIHS 1,211*   < > 1,697* 1,709* 2,373*  --    < > = values in this interval not displayed.     Estimated Creatinine Clearance: 13.1 mL/min (A) (by C-G formula based on SCr of 4.55 mg/dL (H)).   Medical History: Past Medical History:  Diagnosis Date   Anemia    Arthritis    Chronic combined systolic and diastolic CHF (congestive heart failure) (HCC)    a. Etiology of low EF not defined.   DVT (deep venous thrombosis) (HCC)    a. 01/2015: RLE DVT. VQ scan intermediate probability. Underwent renal bx complicated by perinephric hematoma; anticoagulation stopped and IVC filter placed.   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 Received plasma exchange and steroids but didn't respond and ended up starting hemodialysis June 2016.    Essential hypertension    GERD (gastroesophageal reflux disease)    History of nuclear stress test    Myoview 9/19 [facility]): low risk    HTN (hypertension)    Hypoalbuminemia    Hypoglycemia    NSTEMI (non-ST elevated myocardial infarction) (HCC) 04/18/2015   OA (osteoarthritis) of knee    Perinephric hematoma 01/2015   Proctitis 07/2015    Protein calorie malnutrition (HCC)    Pulmonary fibrosis (HCC)    Sjogren's disease (HCC) 01/28/2015    Medications:  Medications Prior to Admission  Medication Sig Dispense Refill Last Dose   Accu-Chek Softclix Lancets lancets Use to test blood glucose four times daily as directed. 100 each 1    albuterol (VENTOLIN HFA) 108 (90 Base) MCG/ACT inhaler Inhale 2 puffs into the lungs every 6 (six) hours as needed for wheezing or shortness of breath.      apixaban (ELIQUIS) 2.5 MG TABS tablet Take 1 tablet (2.5 mg total) by mouth 2 (two) times daily. 60 tablet 0    atorvastatin (LIPITOR) 40 MG tablet Take 1 tablet (40 mg total) by mouth daily at 6 PM. (Patient not taking: Reported on 12/12/2022) 30 tablet 0    Blood Glucose Monitoring Suppl (BLOOD GLUCOSE MONITOR SYSTEM) w/Device KIT Use up to four times daily as directed. 1 kit 1    Brinzolamide-Brimonidine 1-0.2 % SUSP Place 1 drop into both eyes in the morning and at bedtime.      calcitRIOL (ROCALTROL) 0.25 MCG capsule Take 13 capsules (3.25 mcg total) by mouth every Monday, Wednesday, and  Friday with hemodialysis.      carvedilol (COREG) 12.5 MG tablet Take 1 tablet (12.5 mg total) by mouth 2 (two) times daily with a meal. 180 tablet 2    cinacalcet (SENSIPAR) 30 MG tablet Take 3 tablets (90 mg total) by mouth every Monday, Wednesday, and Friday with hemodialysis. 60 tablet     ferric gluconate 62.5 mg in sodium chloride 0.9 % 100 mL Inject 62.5 mg into the vein every Wednesday with hemodialysis.      gabapentin (NEURONTIN) 300 MG capsule Take 300 mg by mouth daily.      glucose blood test strip Use to test blood glucose four times daily as directed 100 each 1    HYDROcodone-acetaminophen (NORCO/VICODIN) 5-325 MG tablet Take 1 tablet by mouth every 4 (four) hours as needed for moderate pain or severe pain. 40 tablet 0    latanoprost (XALATAN) 0.005 % ophthalmic solution Place 1 drop into both eyes at bedtime.      midodrine (PROAMATINE) 10 MG  tablet Take 1 tablet (10 mg total) by mouth every Monday, Wednesday, and Friday. Take 10 mg (1 tablet) by mouth before dialysis and 10 mg (1 tablet) at dialysis as directed. 45 tablet 1    multivitamin (RENA-VIT) TABS tablet Take 1 tablet by mouth at bedtime. 90 tablet 0    nitroGLYCERIN (NITROSTAT) 0.4 MG SL tablet Place 1 tablet (0.4 mg total) under the tongue every 5 (five) minutes as needed for chest pain (for total of 3 doses at the most). 10 tablet 1    omeprazole (PRILOSEC) 40 MG capsule Take 40 mg by mouth daily. (Patient not taking: Reported on 12/12/2022)  11    polyethylene glycol powder (MIRALAX) 17 GM/SCOOP powder Take 17 g by mouth daily. Hold for diarrhea (watery stools). 255 g 0    sevelamer carbonate (RENVELA) 800 MG tablet Take 2 tablets (1,600 mg total) by mouth 3 (three) times daily with meals. (Patient taking differently: Take 1,800 mg by mouth 3 (three) times daily with meals.) 90 tablet 0    timolol (TIMOPTIC-XR) 0.5 % ophthalmic gel-forming Place 1 drop into both eyes daily.       Scheduled:   aspirin EC  81 mg Oral Daily   atorvastatin  40 mg Oral Daily   [START ON 01/17/2023] calcitRIOL  3.25 mcg Oral Q M,W,F-HD   Chlorhexidine Gluconate Cloth  6 each Topical Q0600   [START ON 01/17/2023] cinacalcet  90 mg Oral Q M,W,F-HD   [START ON 01/17/2023] midodrine  10 mg Oral Q M,W,F   multivitamin  1 tablet Oral QHS   sevelamer carbonate  1,600 mg Oral TID WC   Infusions:   albumin human     heparin 1,150 Units/hr (01/15/23 1038)   PRN: albumin human  Assessment: 82 yom with a history of HTN, lower extremity DVT s/p IVC filter, CAD, Sjogren's disease, recent right hip fx s/p THA, and ESRD. Patient is presenting with SOB. Heparin per pharmacy consult placed for chest pain/ACS. Patient is supposed to be on apixaban prior to arrival, however patient and family member in room state not taking.  Baseline heparin level is not elevated, suggesting no recent use of DOAC. Will continue  monitoring with heparin levels only.   PM update - heparin level subtherapeutic at 0.2. Hgb 7.8 stable, plt WNL. No bleeding or issues with infusion per discussion with RN.  Goal of Therapy:  Heparin level 0.3-0.7 units/ml Monitor platelets by anticoagulation protocol: Yes   Plan:  Give  heparin 1000 unit bolus x 1 Increase heparin infusion to 1300 units/hour Check 8-hour heparin level Monitor daily CBC, and signs/symptoms of bleeding  Leia Alf, PharmD, BCPS Please check AMION for all [facility] contact numbers Clinical Pharmacist 01/15/2023 7:04 PM

## 2023-01-15 NOTE — Assessment & Plan Note (Addendum)
Presented with hypoxia to 83%, CXR showing effusions, pulmonary edema.  BNP >4500 from prior 300s.  Respiratory failure ruled out. HD yesterday, net negative -1.9L - Volume removal by HD - Consult Nephrology and Cardiology, appreciate expertise - Beta-blocker per Cardiology - Continue midodrine

## 2023-01-16 ENCOUNTER — Inpatient Hospital Stay (HOSPITAL_COMMUNITY): Payer: No Typology Code available for payment source

## 2023-01-16 ENCOUNTER — Encounter (HOSPITAL_COMMUNITY): Payer: Self-pay | Admitting: Internal Medicine

## 2023-01-16 DIAGNOSIS — G9340 Encephalopathy, unspecified: Secondary | ICD-10-CM

## 2023-01-16 DIAGNOSIS — G9341 Metabolic encephalopathy: Secondary | ICD-10-CM

## 2023-01-16 DIAGNOSIS — A419 Sepsis, unspecified organism: Secondary | ICD-10-CM

## 2023-01-16 DIAGNOSIS — I214 Non-ST elevation (NSTEMI) myocardial infarction: Secondary | ICD-10-CM | POA: Diagnosis not present

## 2023-01-16 DIAGNOSIS — R579 Shock, unspecified: Secondary | ICD-10-CM

## 2023-01-16 DIAGNOSIS — E872 Acidosis, unspecified: Secondary | ICD-10-CM | POA: Diagnosis not present

## 2023-01-16 DIAGNOSIS — R6520 Severe sepsis without septic shock: Secondary | ICD-10-CM

## 2023-01-16 DIAGNOSIS — I5043 Acute on chronic combined systolic (congestive) and diastolic (congestive) heart failure: Secondary | ICD-10-CM | POA: Diagnosis not present

## 2023-01-16 DIAGNOSIS — N186 End stage renal disease: Secondary | ICD-10-CM | POA: Diagnosis not present

## 2023-01-16 DIAGNOSIS — R7401 Elevation of levels of liver transaminase levels: Secondary | ICD-10-CM | POA: Insufficient documentation

## 2023-01-16 DIAGNOSIS — J9601 Acute respiratory failure with hypoxia: Secondary | ICD-10-CM | POA: Diagnosis not present

## 2023-01-16 DIAGNOSIS — I2510 Atherosclerotic heart disease of native coronary artery without angina pectoris: Secondary | ICD-10-CM | POA: Diagnosis not present

## 2023-01-16 LAB — COMPREHENSIVE METABOLIC PANEL
ALT: 403 U/L — ABNORMAL HIGH (ref 0–44)
AST: 492 U/L — ABNORMAL HIGH (ref 15–41)
Albumin: 2.8 g/dL — ABNORMAL LOW (ref 3.5–5.0)
Alkaline Phosphatase: 86 U/L (ref 38–126)
Anion gap: 29 — ABNORMAL HIGH (ref 5–15)
BUN: 47 mg/dL — ABNORMAL HIGH (ref 8–23)
CO2: 16 mmol/L — ABNORMAL LOW (ref 22–32)
Calcium: 8.3 mg/dL — ABNORMAL LOW (ref 8.9–10.3)
Chloride: 91 mmol/L — ABNORMAL LOW (ref 98–111)
Creatinine, Ser: 6.95 mg/dL — ABNORMAL HIGH (ref 0.61–1.24)
GFR, Estimated: 7 mL/min — ABNORMAL LOW (ref >60)
Glucose, Bld: 144 mg/dL — ABNORMAL HIGH (ref 70–99)
Potassium: 5.1 mmol/L (ref 3.5–5.1)
Sodium: 136 mmol/L (ref 135–145)
Total Bilirubin: 1.3 mg/dL — ABNORMAL HIGH (ref 0.3–1.2)
Total Protein: 7.4 g/dL (ref 6.5–8.1)

## 2023-01-16 LAB — HEPARIN LEVEL (UNFRACTIONATED)
Heparin Unfractionated: 0.23 IU/mL — ABNORMAL LOW (ref 0.30–0.70)
Heparin Unfractionated: 0.2 IU/mL — ABNORMAL LOW (ref 0.30–0.70)

## 2023-01-16 LAB — BPAM RBC: Blood Product Expiration Date: 202405282359

## 2023-01-16 LAB — AMMONIA: Ammonia: 26 umol/L (ref 9–35)

## 2023-01-16 LAB — HEPATITIS PANEL, ACUTE
HCV Ab: NONREACTIVE
Hep A IgM: NONREACTIVE
Hep B C IgM: NONREACTIVE
Hepatitis B Surface Ag: NONREACTIVE

## 2023-01-16 LAB — BASIC METABOLIC PANEL
Anion gap: 20 — ABNORMAL HIGH (ref 5–15)
BUN: 41 mg/dL — ABNORMAL HIGH (ref 8–23)
CO2: 21 mmol/L — ABNORMAL LOW (ref 22–32)
Calcium: 8.3 mg/dL — ABNORMAL LOW (ref 8.9–10.3)
Chloride: 92 mmol/L — ABNORMAL LOW (ref 98–111)
Creatinine, Ser: 6.32 mg/dL — ABNORMAL HIGH (ref 0.61–1.24)
GFR, Estimated: 8 mL/min — ABNORMAL LOW (ref >60)
Glucose, Bld: 109 mg/dL — ABNORMAL HIGH (ref 70–99)
Potassium: 4.5 mmol/L (ref 3.5–5.1)
Sodium: 133 mmol/L — ABNORMAL LOW (ref 135–145)

## 2023-01-16 LAB — CBC
HCT: 27.7 % — ABNORMAL LOW (ref 39.0–52.0)
HCT: 24.4 % — ABNORMAL LOW (ref 39.0–52.0)
Hemoglobin: 8.7 g/dL — ABNORMAL LOW (ref 13.0–17.0)
Hemoglobin: 7.2 g/dL — ABNORMAL LOW (ref 13.0–17.0)
MCH: 30.4 pg (ref 26.0–34.0)
MCH: 30.4 pg (ref 26.0–34.0)
MCHC: 31.4 g/dL (ref 30.0–36.0)
MCHC: 29.5 g/dL — ABNORMAL LOW (ref 30.0–36.0)
MCV: 96.9 fL (ref 80.0–100.0)
MCV: 103 fL — ABNORMAL HIGH (ref 80.0–100.0)
Platelets: 192 10*3/uL (ref 150–400)
Platelets: 205 10*3/uL (ref 150–400)
RBC: 2.86 MIL/uL — ABNORMAL LOW (ref 4.22–5.81)
RBC: 2.37 MIL/uL — ABNORMAL LOW (ref 4.22–5.81)
RDW: 19.2 % — ABNORMAL HIGH (ref 11.5–15.5)
RDW: 19.6 % — ABNORMAL HIGH (ref 11.5–15.5)
WBC: 24.9 10*3/uL — ABNORMAL HIGH (ref 4.0–10.5)
WBC: 24.1 10*3/uL — ABNORMAL HIGH (ref 4.0–10.5)
nRBC: 12.6 % — ABNORMAL HIGH (ref 0.0–0.2)
nRBC: 18.4 % — ABNORMAL HIGH (ref 0.0–0.2)

## 2023-01-16 LAB — COOXEMETRY PANEL
Carboxyhemoglobin: 1.8 % — ABNORMAL HIGH (ref 0.5–1.5)
Methemoglobin: 1.2 % (ref 0.0–1.5)
O2 Saturation: 78.5 %
Total hemoglobin: 7.5 g/dL — ABNORMAL LOW (ref 12.0–16.0)

## 2023-01-16 LAB — GLUCOSE, CAPILLARY
Glucose-Capillary: 38 mg/dL — CL (ref 70–99)
Glucose-Capillary: 132 mg/dL — ABNORMAL HIGH (ref 70–99)
Glucose-Capillary: 175 mg/dL — ABNORMAL HIGH (ref 70–99)
Glucose-Capillary: 24 mg/dL — CL (ref 70–99)
Glucose-Capillary: <10 mg/dL — CL (ref 70–99)
Glucose-Capillary: <10 mg/dL — CL (ref 70–99)
Glucose-Capillary: <10 mg/dL — CL (ref 70–99)
Glucose-Capillary: <10 mg/dL — CL (ref 70–99)

## 2023-01-16 LAB — PREPARE RBC (CROSSMATCH)

## 2023-01-16 LAB — LACTIC ACID, PLASMA
Lactic Acid, Venous: >9 mmol/L (ref 0.5–1.9)
Lactic Acid, Venous: >9 mmol/L (ref 0.5–1.9)
Lactic Acid, Venous: 4.8 mmol/L (ref 0.5–1.9)

## 2023-01-16 LAB — LIPASE, BLOOD: Lipase: 40 U/L (ref 11–51)

## 2023-01-16 LAB — TROPONIN I (HIGH SENSITIVITY): Troponin I (High Sensitivity): 2679 ng/L (ref <18)

## 2023-01-16 LAB — PROTIME-INR
INR: 1.9 — ABNORMAL HIGH (ref 0.8–1.2)
Prothrombin Time: 21.8 seconds — ABNORMAL HIGH (ref 11.4–15.2)

## 2023-01-16 LAB — TYPE AND SCREEN: ABO/RH(D): A POS

## 2023-01-16 LAB — APTT: aPTT: 91 seconds — ABNORMAL HIGH (ref 24–36)

## 2023-01-16 LAB — MRSA NEXT GEN BY PCR, NASAL: MRSA by PCR Next Gen: NOT DETECTED

## 2023-01-16 MED ORDER — VANCOMYCIN HCL 750 MG/150ML IV SOLN
750.0000 mg | INTRAVENOUS | Status: DC
  Administered 2023-01-17: 750 mg via INTRAVENOUS
  Filled 2023-01-16: qty 150

## 2023-01-16 MED ORDER — SODIUM CHLORIDE 0.9% FLUSH: 3.0000 mL | INTRAVENOUS | Status: DC | PRN

## 2023-01-16 MED ORDER — HEPARIN BOLUS VIA INFUSION
1000.0000 [IU] | Freq: Once | INTRAVENOUS | Status: AC
  Administered 2023-01-16: 1000 [IU] via INTRAVENOUS
  Filled 2023-01-16: qty 1000

## 2023-01-16 MED ORDER — DEXTROSE 10 % IV SOLN: INTRAVENOUS | Status: DC

## 2023-01-16 MED ORDER — DEXTROSE 50 % IV SOLN
25.0000 g | INTRAVENOUS | Status: AC
  Administered 2023-01-16: 25 g via INTRAVENOUS

## 2023-01-16 MED ORDER — SODIUM CHLORIDE 0.9 % IV SOLN: INTRAVENOUS | Status: DC

## 2023-01-16 MED ORDER — METRONIDAZOLE 500 MG/100ML IV SOLN
500.0000 mg | Freq: Two times a day (BID) | INTRAVENOUS | Status: DC
  Administered 2023-01-16 – 2023-01-17 (×4): 500 mg via INTRAVENOUS
  Filled 2023-01-16 (×5): qty 100

## 2023-01-16 MED ORDER — SODIUM CHLORIDE 0.9% IV SOLUTION: Freq: Once | INTRAVENOUS | Status: DC

## 2023-01-16 MED ORDER — DEXTROSE 50 % IV SOLN
INTRAVENOUS | Status: AC
  Filled 2023-01-16: qty 50

## 2023-01-16 MED ORDER — VANCOMYCIN HCL 1250 MG/250ML IV SOLN
1250.0000 mg | Freq: Once | INTRAVENOUS | Status: AC
  Administered 2023-01-16: 1250 mg via INTRAVENOUS
  Filled 2023-01-16: qty 250

## 2023-01-16 MED ORDER — CARVEDILOL 6.25 MG PO TABS
6.2500 mg | ORAL_TABLET | Freq: Two times a day (BID) | ORAL | Status: DC
  Administered 2023-01-16: 6.25 mg via ORAL
  Filled 2023-01-16: qty 1

## 2023-01-16 MED ORDER — IOHEXOL 350 MG/ML SOLN
75.0000 mL | Freq: Once | INTRAVENOUS | Status: AC | PRN
  Administered 2023-01-16: 75 mL via INTRAVENOUS

## 2023-01-16 MED ORDER — VANCOMYCIN HCL IN DEXTROSE 1-5 GM/200ML-% IV SOLN
1000.0000 mg | Freq: Once | INTRAVENOUS | Status: DC
  Filled 2023-01-16: qty 200

## 2023-01-16 MED ORDER — LACTATED RINGERS IV BOLUS
1000.0000 mL | Freq: Once | INTRAVENOUS | Status: AC
  Administered 2023-01-16: 1000 mL via INTRAVENOUS

## 2023-01-16 MED ORDER — SODIUM CHLORIDE 0.9 % IV SOLN
1.0000 g | INTRAVENOUS | Status: DC
  Administered 2023-01-16: 1 g via INTRAVENOUS
  Filled 2023-01-16 (×2): qty 10

## 2023-01-16 MED ORDER — PANTOPRAZOLE SODIUM 40 MG IV SOLR
40.0000 mg | Freq: Two times a day (BID) | INTRAVENOUS | Status: DC
  Administered 2023-01-17 – 2023-01-21 (×10): 40 mg via INTRAVENOUS
  Filled 2023-01-16 (×10): qty 10

## 2023-01-16 MED ORDER — SODIUM CHLORIDE 0.9 % IV SOLN: 2.0000 g | Freq: Once | INTRAVENOUS | Status: DC

## 2023-01-16 MED ORDER — SODIUM CHLORIDE 0.9% FLUSH
3.0000 mL | Freq: Two times a day (BID) | INTRAVENOUS | Status: DC
  Administered 2023-01-16 – 2023-01-21 (×10): 3 mL via INTRAVENOUS

## 2023-01-16 MED ORDER — DEXTROSE 50 % IV SOLN
12.5000 g | INTRAVENOUS | Status: AC
  Administered 2023-01-16: 12.5 g via INTRAVENOUS

## 2023-01-16 MED ORDER — SODIUM CHLORIDE 0.9 % IV SOLN: 250.0000 mL | INTRAVENOUS | Status: DC | PRN

## 2023-01-16 NOTE — Progress Notes (Signed)
Patient had a BG of 57 at 0845. This Rn was notified by the tech immediately. This RN gave patient 4 oz of juice and applesauce. BG recheck at 0900 was still 56. Danford MD paged and messaged via secure chat- see new orders. D50 administered and blood sugar recheck was 130- see results.  1030: Danford MD at the bedside and expressed to this RN about being concerned about the pt. Stat labs ordered - see orders. Vital signs stable at this time. Rectal temp was 99.31F. This RN notified Mitzi Davenport Rapid Response RN and Dorthula Matas at this time. Karie Fetch DO from critical care at the bedside to assess the patient. Patient will be transferred to [facility], report given to [facility]

## 2023-01-16 NOTE — Progress Notes (Signed)
Lactate came back at 9. WBC 24.9. Trop 2679. Unclear if cardiogenic vs septic vs mixed picture. ICU team consulted and patient being transferred to the unit. Long discussion held with family and they wish to remain full code with full scope of care. Discussed that overall prognosis is likely poor given multiorgan dysfunction but they would like [place] to "try anything." Agree with central line placement and checking co-ox and CVP. If co-ox low, can start dobutamine for inotropic support. Patient is not a MCS candidate. Will re-discuss cath in the AM pending on above work-up/clinical stability.   Laurance Flatten, MD

## 2023-01-16 NOTE — Assessment & Plan Note (Signed)
At baseline is interactive and appropriate, no significant cognitive impairment.  Here is disoriented and hallucinating.  Due to CHF, NSTEMI, shock.

## 2023-01-16 NOTE — Progress Notes (Signed)
Elink following for sepsis protocol. 

## 2023-01-16 NOTE — Progress Notes (Signed)
ANTICOAGULATION CONSULT NOTE Pharmacy Consult for Heparin Indication: chest pain/ACS   No Known Allergies  Patient Measurements: Height: 5\' 10"  (177.8 cm) Weight: 64 kg (141 lb) (wt. taken w/ no extra linen on prev. zeroed bed) IBW/kg (Calculated) : 73 Heparin Dosing Weight: 74.8 kg  Vital Signs: Temp: 99.2 F (37.3 C) (05/05 1103) Temp Source: Rectal (05/05 1103) BP: 130/85 (05/05 1103) Pulse Rate: 104 (05/05 1103)  Labs: Recent Labs    02/01/2023 1841 01/17/2023 2052 01/30/2023 2330 01/15/23 0101 01/15/23 0835 01/15/23 1809 01/16/23 0345 01/16/23 0928 01/16/23 1224  HGB 8.0*  --   --   --  7.8*  --  8.7*  --   --   HCT 26.2*  --   --   --  25.7*  --  27.7*  --   --   PLT 191  --   --   --  218  --  192  --   --   APTT  --   --  36  --  38*  --   --  91*  --   LABPROT  --   --   --   --   --   --   --  21.8*  --   INR  --   --   --   --   --   --   --  1.9*  --   HEPARINUNFRC  --    < > <0.10*  --  <0.10* 0.20* 0.23*  --  0.20*  CREATININE 4.63*  --   --   --  4.55*  --  6.32* 6.95*  --   TROPONINIHS 1,211*   < > 1,697* 1,709* 2,373*  --   --  2,679*  --    < > = values in this interval not displayed.     Estimated Creatinine Clearance: 7.5 mL/min (A) (by C-G formula based on SCr of 6.95 mg/dL (H)).   Assessment: 82 y.o. male with NSTEMI on  heparin. Plans noted for cath on Monday -heparin level = 0.2 on 1400 units/hr -hg= 8.7 and stable  Goal of Therapy:  Heparin level 0.3-0.7 units/ml Monitor platelets by anticoagulation protocol: Yes   Plan:  -heparin bolus 1000 units x1 then increase to 1550 units/hr -Heparin level in 8 hours and daily wth CBC daily  Harland German, PharmD Clinical Pharmacist **Pharmacist phone directory can now be found on [email or web address] ([email or web address]).  Listed under [facility].

## 2023-01-16 NOTE — Progress Notes (Addendum)
[facility] Progress Note   Subjective: seen in room. Oriented to person and place. Issue with hypoglycemia. Management per primary. For cath and HD tomorrow. Would like to do HD prior to cath if possible for accurate volume. Lactate and BC drawn per PCP.   Objective Vitals:   01/15/23 2239 01/15/23 2240 01/16/23 0328 01/16/23 0500  BP:  125/87 137/84   Pulse: (!) 105     Resp:   20   Temp:   97.8 F (36.6 C)   TempSrc:   Oral   SpO2: 92%     Weight:   64 kg 64 kg  Height:       Physical Exam General: Chronically ill very thin male in NAD Heart: S1,S2 2/6 systolic M. No R/G SR on monitor.  Lungs: CTAB decreased in bases. No WOB.  Abdomen: NABS, non tender Extremities: No LE edema Dialysis Access: L AVG + T/B   Additional Objective Labs: Basic Metabolic Panel: Recent Labs  Lab 01/26/2023 1841 01/15/23 0835 01/16/23 0345  NA 135 135 133*  K 3.9 3.8 4.5  CL 90* 90* 92*  CO2 21* 26 21*  GLUCOSE 109* 99 109*  BUN 16 20 41*  CREATININE 4.63* 4.55* 6.32*  CALCIUM 8.1* 8.5* 8.3*  PHOS  --  3.6  --    Liver Function Tests: Recent Labs  Lab 01/15/23 0835  ALBUMIN 3.0*   Recent Labs  Lab 02/02/2023 1841  LIPASE 32   CBC: Recent Labs  Lab 01/24/2023 1841 01/15/23 0835 01/16/23 0345  WBC 15.1* 16.7* 24.9*  NEUTROABS 12.2*  --   --   HGB 8.0* 7.8* 8.7*  HCT 26.2* 25.7* 27.7*  MCV 99.6 98.5 96.9  PLT 191 218 192   Blood Culture    Component Value Date/Time   SDES BLOOD LEFT HAND 08/12/2022 1908   SPECREQUEST  08/12/2022 1908    BOTTLES DRAWN AEROBIC ONLY Blood Culture results may not be optimal due to an inadequate volume of blood received in culture bottles   CULT  08/12/2022 1908    NO GROWTH 5 DAYS Performed at [facility], [address], [place], [place] [address]    REPTSTATUS 08/17/2022 FINAL 08/12/2022 1908    Cardiac Enzymes: No results for input(s): "CKTOTAL", "CKMB", "CKMBINDEX", "TROPONINI" in the last 168  hours. CBG: Recent Labs  Lab 01/16/23 0859 01/16/23 0950  GLUCAP 38* 132*   Iron Studies: No results for input(s): "IRON", "TIBC", "TRANSFERRIN", "FERRITIN" in the last 72 hours. @lablastinr3 @ Studies/Results: ECHOCARDIOGRAM COMPLETE  Result Date: 01/15/2023    ECHOCARDIOGRAM REPORT   Patient Name:   Travis [name] Date of Exam: 01/15/2023 Medical Rec #:  [id number]       Height:       70.0 in Accession #:    [id number]      Weight:       165.0 lb Date of Birth:  Jun 23, 1941       BSA:          1.923 m Patient Age:    82 years        BP:           125/83 mmHg Patient Gender: M               HR:           109 bpm. Exam Location:  Inpatient Procedure: 2D Echo, Color Doppler, Cardiac Doppler and Intracardiac            Opacification  Agent Indications:    I50.21 Acute systolic (congestive) heart failure  History:        Patient has prior history of Echocardiogram examinations, most                 recent 07/06/2022. CHF, CAD; Risk Factors:Hypertension.  Sonographer:    Irving Burton Senior RDCS Referring Phys: Darlin Drop  Sonographer Comments: Apical window foreshortened due to thin body habitus. IMPRESSIONS  1. Left ventricular ejection fraction, by estimation, is 25 to 30%. The left ventricle has severely decreased function. The left ventricle demonstrates regional wall motion abnormalities (see scoring diagram/findings for description). The left ventricular internal cavity size was moderately to severely dilated. There is moderate asymmetric left ventricular hypertrophy of the infero-lateral segment. Indeterminate diastolic filling due to E-A fusion.  2. Right ventricular systolic function is low normal. The right ventricular size is mildly enlarged. There is moderately elevated pulmonary artery systolic pressure. The estimated right ventricular systolic pressure is 55.2 mmHg.  3. Left atrial size was severely dilated.  4. Right atrial size was severely dilated.  5. The mitral valve is degenerative. Moderate  mitral valve regurgitation.  6. Tricuspid valve regurgitation is moderate to severe.  7. The aortic valve is tricuspid. There is moderate calcification of the aortic valve. There is moderate thickening of the aortic valve. Aortic valve regurgitation is mild to moderate. Likely moderate vs mild to moderate low flow, low gradient AS with AVA 1.1cm2, mean gradient 14, Vmax 2.33, DI 0.37. SVi low at 20.Marland Kitchen Aortic valve mean gradient measures 14.0 mmHg. Aortic valve Vmax measures 2.33 m/s.  8. The inferior vena cava is dilated in size with <50% respiratory variability, suggesting right atrial pressure of 15 mmHg. Comparison(s): Compared to prior TTE on 06/2022, the LVEF has dropped from 45-50% to 25-30% with WMA as described. The MR and TR are worse in the setting of elevated filling pressures. FINDINGS  Left Ventricle: Left ventricular ejection fraction, by estimation, is 25 to 30%. The left ventricle has severely decreased function. The left ventricle demonstrates regional wall motion abnormalities. Definity contrast agent was given IV to delineate the left ventricular endocardial borders. The left ventricular internal cavity size was moderately to severely dilated. There is moderate asymmetric left ventricular hypertrophy of the infero-lateral segment. Indeterminate diastolic filling due to E-A fusion.  LV Wall Scoring: The entire anterior wall, mid and distal lateral wall, entire septum, entire inferior wall, and mid anterolateral segment are hypokinetic. The basal inferolateral segment, basal anterolateral segment, and apex are normal. Right Ventricle: The right ventricular size is mildly enlarged. No increase in right ventricular wall thickness. Right ventricular systolic function is low normal. There is moderately elevated pulmonary artery systolic pressure. The tricuspid regurgitant  velocity is 3.17 m/s, and with an assumed right atrial pressure of 15 mmHg, the estimated right ventricular systolic pressure is 55.2  mmHg. Left Atrium: Left atrial size was severely dilated. Right Atrium: Right atrial size was severely dilated. Pericardium: There is no evidence of pericardial effusion. Mitral Valve: The mitral valve is degenerative in appearance. There is mild thickening of the mitral valve leaflet(s). There is mild calcification of the mitral valve leaflet(s). Moderate mitral valve regurgitation. Tricuspid Valve: The tricuspid valve is normal in structure. Tricuspid valve regurgitation is moderate to severe. The flow in the hepatic veins is blunted (decreased) during ventricular systole. Aortic Valve: The aortic valve is tricuspid. There is moderate calcification of the aortic valve. There is moderate thickening of the aortic valve. Aortic valve  regurgitation is mild to moderate. Aortic regurgitation PHT measures 262 msec. Likely moderate vs mild to moderate low flow, low gradient AS with AVA 1.1cm2, mean gradient 14, Vmax 2.33, DI 0.37. SVi low at 20. Aortic valve mean gradient measures 14.0 mmHg. Aortic valve peak gradient measures 21.7 mmHg. Aortic valve area, by VTI measures 1.08 cm. Pulmonic Valve: The pulmonic valve was grossly normal. Pulmonic valve regurgitation is mild. Aorta: The aortic root and ascending aorta are structurally normal, with no evidence of dilitation. Venous: A systolic blunting flow pattern is recorded from the left upper pulmonary vein and the right lower pulmonary vein. The inferior vena cava is dilated in size with less than 50% respiratory variability, suggesting right atrial pressure of 15 mmHg. IAS/Shunts: No atrial level shunt detected by color flow Doppler.  LEFT VENTRICLE PLAX 2D LVIDd:         5.10 cm      Diastology LVIDs:         4.30 cm      LV e' medial:    7.40 cm/s LV PW:         1.40 cm      LV E/e' medial:  16.1 LV IVS:        0.80 cm      LV e' lateral:   11.00 cm/s LVOT diam:     2.00 cm      LV E/e' lateral: 10.8 LV SV:         38 LV SV Index:   20 LVOT Area:     3.14 cm  LV  Volumes (MOD) LV vol d, MOD A2C: 196.0 ml LV vol d, MOD A4C: 135.0 ml LV vol s, MOD A2C: 139.0 ml LV vol s, MOD A4C: 85.5 ml LV SV MOD A2C:     57.0 ml LV SV MOD A4C:     135.0 ml LV SV MOD BP:      59.3 ml RIGHT VENTRICLE RV S prime:     9.57 cm/s TAPSE (M-mode): 1.6 cm LEFT ATRIUM              Index        RIGHT ATRIUM           Index LA diam:        3.40 cm  1.77 cm/m   RA Area:     23.80 cm LA Vol (A2C):   110.0 ml 57.19 ml/m  RA Volume:   84.40 ml  43.88 ml/m LA Vol (A4C):   82.6 ml  42.94 ml/m LA Biplane Vol: 96.1 ml  49.96 ml/m  AORTIC VALVE AV Area (Vmax):    1.16 cm AV Area (Vmean):   1.08 cm AV Area (VTI):     1.08 cm AV Vmax:           233.00 cm/s AV Vmean:          178.000 cm/s AV VTI:            0.352 m AV Peak Grad:      21.7 mmHg AV Mean Grad:      14.0 mmHg LVOT Vmax:         85.90 cm/s LVOT Vmean:        61.300 cm/s LVOT VTI:          0.121 m LVOT/AV VTI ratio: 0.34 AI PHT:            262 msec  AORTA Ao Root diam: 3.20 cm Ao Asc diam:  3.60 cm MITRAL VALVE                  TRICUSPID VALVE MV Area (PHT): 4.99 cm       TR Peak grad:   40.2 mmHg MV Decel Time: 152 msec       TR Vmax:        317.00 cm/s MR Peak grad:    82.1 mmHg MR Mean grad:    55.0 mmHg    SHUNTS MR Vmax:         453.00 cm/s  Systemic VTI:  0.12 m MR Vmean:        354.0 cm/s   Systemic Diam: 2.00 cm MR PISA:         1.01 cm MR PISA Eff ROA: 9 mm MR PISA Radius:  0.40 cm MV E velocity: 119.00 cm/s Laurance Flatten MD Electronically signed by Laurance Flatten MD Signature Date/Time: 01/15/2023/11:16:53 AM    Final    DG Chest Portable 1 View  Result Date: 01/22/2023 CLINICAL DATA:  Shortness of breath EXAM: PORTABLE CHEST 1 VIEW COMPARISON:  CXR 12/10/22 FINDINGS: Cardiomegaly. Trace right pleural effusion. No pneumothorax. No focal airspace opacity. There are prominent bilateral interstitial opacities which could represent pulmonary venous congestion or mild pulmonary edema. Vascular stent projects over the right lung  apex. No radiographically apparent displaced rib fractures. Visualized upper abdomen is unremarkable IMPRESSION: Cardiomegaly with trace right pleural effusion and mild pulmonary edema. Electronically Signed   By: Lorenza Cambridge M.D.   On: 01/25/2023 17:51   Medications:  albumin human     ceFEPime (MAXIPIME) IV     heparin 1,400 Units/hr (01/16/23 0500)   metronidazole     vancomycin      aspirin EC  81 mg Oral Daily   atorvastatin  40 mg Oral Daily   [START ON 01/17/2023] calcitRIOL  3.25 mcg Oral Q M,W,F-HD   carvedilol  6.25 mg Oral BID WC   Chlorhexidine Gluconate Cloth  6 each Topical Q0600   [START ON 01/17/2023] cinacalcet  90 mg Oral Q M,W,F-HD   dextrose       [START ON 01/17/2023] midodrine  10 mg Oral Q M,W,F   multivitamin  1 tablet Oral QHS   sevelamer carbonate  1,600 mg Oral TID WC     P HD: [facility] MWF 3.5h  400/1.5   67.4kg   2/2 bath  LFA AVG  Heparin none - last HD 5/03, post wt 66.7kg - rocaltrol 2.5 mcg po tiw - sensipar 150mg  po tiw - mircera 200 mcg IV q 2wks, last 4/24, due 5/08     Assessment/ Plan: Leukocytosis-WBC 24.9. BC and lactate pending per primary.  Acute hypoxic resp failure - w/ pulm edema on CXR and also ^trops c/w possible NSTEMI. Went for HD overnight w/ 2L off. Is on RA now and not hypoxic. Possibly this is flash pulm edema from nstemi. Exam is normal this am. Will get f/u CXR.   NSTEMI, cardiology consulting. 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 Had usual HD yesterday at his OP unit. Then was admitted and had another HD overnight as above. HD 01/17/2023. Will need to coordinate with cath lab schedule. Ideally HD first then cardiac cath. Will order 1st shift.  HTN/ volume - needs good standing wt or good bed weight. No edema on exam this am. BP's are wnl.  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 Follow.  MBD ckd - CCa  and phos are in range. Cont renvela as binder w/ sensipar and po vdra.  HFrEF -Repeat echo 01/15/23 25-30%.  H/o DVT - sp IVC  filter. Takes eliquis at home, now on IV heparin gtt H/o hypotension - takes midodrine pre HD on mwf.   Farmer Mccahill H. Marica Trentham NP-C 01/16/2023, 9:58 AM  [employer] [phone]

## 2023-01-16 NOTE — Progress Notes (Signed)
   01/16/23 1103  Assess: MEWS Score  Temp 99.2 F (37.3 C)  BP 130/85  MAP (mmHg) 99  Pulse Rate (!) 104  ECG Heart Rate (!) 109  Resp 20  Level of Consciousness Responds to Voice  SpO2 95 %  O2 Device Nasal Cannula  O2 Flow Rate (L/min) 2 L/min  Assess: MEWS Score  MEWS Temp 0  MEWS Systolic 0  MEWS Pulse 1  MEWS RR 0  MEWS LOC 1  MEWS Score 2  MEWS Score Color Yellow  Assess: if the MEWS score is Yellow or Red  Were vital signs taken at a resting state? Yes  Focused Assessment No change from prior assessment  Does the patient meet 2 or more of the SIRS criteria? Yes  Does the patient have a confirmed or suspected source of infection? Yes  MEWS guidelines implemented  Yes, yellow  Treat  MEWS Interventions Considered administering scheduled or prn medications/treatments as ordered  Take Vital Signs  Increase Vital Sign Frequency  Yellow: Q2hr x1, continue Q4hrs until patient remains green for 12hrs  Escalate  MEWS: Escalate Yellow: Discuss with charge nurse and consider notifying provider and/or RRT  Notify: Charge Nurse/RN  Name of Charge Nurse/RN Notified [facility] RN  Provider Notification  Provider Name/Title Karie Fetch DO  Date Provider Notified 01/16/23  Assess: SIRS CRITERIA  SIRS Temperature  0  SIRS Pulse 1  SIRS Respirations  0  SIRS WBC 1  SIRS Score Sum  2

## 2023-01-16 NOTE — Consult Note (Signed)
NAME:  Travis [name], MRN:  [id number], DOB:  12/21/1940, LOS: 2 ADMISSION DATE:  02/06/2023, CONSULTATION DATE:  01/16/23 REFERRING MD:  Maryfrances Bunnell - TRH , CHIEF COMPLAINT:  SOB   History of Present Illness:  82 yo M PMH ESRD, HFrEF, HTN, mvCAD, presented to ED 5/3 with SOB. Admitted to [facility] for management of NSTEMI, Acute on Chronic HF.  On 5/5 the pt had a decline in mental status,  labs revealing incr trops, markedly elevated BNP, LA > 9, and elevated LFTs   Cardiology is following  PCCM is consulted for ICU transfer in this setting  Pertinent  Medical History  ESRD mvCAD HFrEF  Significant Hospital Events: Including procedures, antibiotic start and stop dates in addition to other pertinent events   5/3 admitted overnight. NSTEMI, medical mgmnt 5/4 Dialysis 5/5 AMS, leukocytosis, LA> 9 incr LFTs trops BNP. PCCM consult ICU transfer   Interim History / Subjective:   Worse mentation. Labs w new abnormalities concerning for possible sepsis. Started on abx  Objective   Blood pressure 130/85, pulse (!) 104, temperature 99.2 F (37.3 C), temperature source Rectal, resp. rate 20, height 5\' 10"  (1.778 m), weight 64 kg, SpO2 95 %.        Intake/Output Summary (Last 24 hours) at 01/16/2023 1314 Last data filed at 01/15/2023 1736 Gross per 24 hour  Intake 139.32 ml  Output --  Net 139.32 ml   Filed Weights   01/23/2023 1656 01/16/23 0328 01/16/23 0500  Weight: 74.8 kg 64 kg 64 kg    Examination: General: elderly ill appearing M  HENT: NCAT pink mm  Lungs: Symmetrical chest expansion  Cardiovascular: tachycardic  Abdomen: Focal RUQ tenderness with slight guarding.  Extremities: no acute joint deformity  Neuro: Lethargic oriented x3 following commands GU: defer  Resolved Hospital Problem list     Assessment & Plan:   Acute encephalopathy  -concern for sepsis, hypoglycemia P -sepsis as below -w elevated LFTs will send Ammonia  -delirium precautions   Acute on chronic  hypoxic resp failure -pulm edema P -volume off via HD  Acute on chronic systolic HF NSTEMI mvCAD  P -cards following -hep gtt -ASA, statin -ICU transfer, cvc, coox    Suspected severe sepsis Leukocytosis  -with RUQ pain c/f chole P -broad abx  -follow cx data -CT a/p pending   Transaminitis RUQ pain  P -stat CT a/p pending   Severe lactic acidosis  AGMA  -unclear if cards process is driving this vs if sepsis  P -transferring to ICU for further workup  ESRD  P -HD per nephro  Hypoglycemia  -d10gtt -- -following CBGs, will try drawing from less peripheral site but in context of developing critical illness w RUQ pain, elevated LFTs -- is concerning for hepatic dysfunction if this profoundly low   Hx DVT s/p IVC filter   Best Practice (right click and "Reselect all SmartList Selections" daily)   Diet/type: NPO DVT prophylaxis: systemic heparin GI prophylaxis: N/A Lines: N/A Foley:  N/A Code Status:  full code Last date of multidisciplinary goals of care discussion [--]  Labs   CBC: Recent Labs  Lab 02/11/2023 1841 01/15/23 0835 01/16/23 0345  WBC 15.1* 16.7* 24.9*  NEUTROABS 12.2*  --   --   HGB 8.0* 7.8* 8.7*  HCT 26.2* 25.7* 27.7*  MCV 99.6 98.5 96.9  PLT 191 218 192    Basic Metabolic Panel: Recent Labs  Lab 01/29/2023 1841 01/15/23 0835 01/16/23 0345 01/16/23 0928  NA 135 135  133* 136  K 3.9 3.8 4.5 5.1  CL 90* 90* 92* 91*  CO2 21* 26 21* 16*  GLUCOSE 109* 99 109* 144*  BUN 16 20 41* 47*  CREATININE 4.63* 4.55* 6.32* 6.95*  CALCIUM 8.1* 8.5* 8.3* 8.3*  MG  --  2.1  --   --   PHOS  --  3.6  --   --    GFR: Estimated Creatinine Clearance: 7.5 mL/min (A) (by C-G formula based on SCr of 6.95 mg/dL (H)). Recent Labs  Lab 02/07/2023 1841 01/15/23 0835 01/16/23 0345 01/16/23 0928  WBC 15.1* 16.7* 24.9*  --   LATICACIDVEN  --   --   --  >9.0*    Liver Function Tests: Recent Labs  Lab 01/15/23 0835 01/16/23 0928  AST  --  492*   ALT  --  403*  ALKPHOS  --  86  BILITOT  --  1.3*  PROT  --  7.4  ALBUMIN 3.0* 2.8*   Recent Labs  Lab 01/15/2023 1841  LIPASE 32   No results for input(s): "AMMONIA" in the last 168 hours.  ABG    Component Value Date/Time   PHART 7.475 (H) 07/06/2022 1150   PCO2ART 40.6 07/06/2022 1150   PO2ART 131 (H) 07/06/2022 1150   HCO3 28.7 (H) 07/06/2022 1153   TCO2 34 (H) 12/06/2022 1243   O2SAT 75 07/06/2022 1153     Coagulation Profile: Recent Labs  Lab 01/16/23 0928  INR 1.9*    Cardiac Enzymes: No results for input(s): "CKTOTAL", "CKMB", "CKMBINDEX", "TROPONINI" in the last 168 hours.  HbA1C: Hgb A1c MFr Bld  Date/Time Value Ref Range Status  05/21/2021 04:27 AM 4.7 (L) 4.8 - 5.6 % Final    Comment:    (NOTE) Pre diabetes:          5.7%-6.4%  Diabetes:              >6.4%  Glycemic control for   <7.0% adults with diabetes   11/06/2015 08:31 AM 4.9 4.8 - 5.6 % Final    Comment:    (NOTE)         Pre-diabetes: 5.7 - 6.4         Diabetes: >6.4         Glycemic control for adults with diabetes: <7.0     CBG: Recent Labs  Lab 01/16/23 1136 01/16/23 1140 01/16/23 1142 01/16/23 1214 01/16/23 1223  GLUCAP <10* <10* 24* <10* 175*    Review of Systems:   Limited due to AMS  + abd pain   Past Medical History:  He,  has a past medical history of Anemia, Arthritis, Chronic combined systolic and diastolic CHF (congestive heart failure) (HCC), DVT (deep venous thrombosis) (HCC), ESRD on hemodialysis (HCC) (02/2015), Essential hypertension, GERD (gastroesophageal reflux disease), History of nuclear stress test, HTN (hypertension), Hypoalbuminemia, Hypoglycemia, NSTEMI (non-ST elevated myocardial infarction) (HCC) (04/18/2015), OA (osteoarthritis) of knee, Perinephric hematoma (01/2015), 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   Surgical History:   Past Surgical History:   Procedure Laterality Date   AV FISTULA PLACEMENT Right 02/17/2015   Procedure: INSERTION OF RIGHT ARM  ARTERIOVENOUS (AV) GORE-TEX GRAFT ;  Surgeon: Sherren Kerns, MD;  Location: [facility] OR;  Service: Vascular;  Laterality: Right;   FLEXIBLE SIGMOIDOSCOPY N/A 08/04/2015   Procedure: FLEXIBLE SIGMOIDOSCOPY;  Surgeon: Charlott Rakes, MD;  Location: [facility] ENDOSCOPY;  Service: Endoscopy;  Laterality: N/A;   HEMORROIDECTOMY  1999  INSERTION OF DIALYSIS CATHETER N/A 02/17/2015   Procedure: INSERTION OF DIALYSIS CATHETER RIGHT INTERNAL JUGULAR VEIN;  Surgeon: Sherren Kerns, MD;  Location: [facility] OR;  Service: Vascular;  Laterality: N/A;   MULTIPLE TOOTH EXTRACTIONS     PERIPHERAL VASCULAR BALLOON ANGIOPLASTY Right 01/23/2019   Procedure: PERIPHERAL VASCULAR BALLOON ANGIOPLASTY;  Surgeon: Sherren Kerns, MD;  Location: [facility] INVASIVE CV LAB;  Service: Cardiovascular;  Laterality: Right;   REVISION OF ARTERIOVENOUS GORETEX GRAFT Right 03/29/2017   Procedure: REVISION OF ARTERIOVENOUS GORETEX GRAFT;  Surgeon: Fransisco Hertz, MD;  Location: [facility] OR;  Service: Vascular;  Laterality: Right;   RIGHT/LEFT HEART CATH AND CORONARY ANGIOGRAPHY N/A 07/06/2022   Procedure: RIGHT/LEFT HEART CATH AND CORONARY ANGIOGRAPHY;  Surgeon: Marykay Lex, MD;  Location: [facility] INVASIVE CV LAB;  Service: Cardiovascular;  Laterality: N/A;   Surgical procedure to remove a mole as a child Right Eye area   At around 79 years old   TOTAL HIP ARTHROPLASTY Right 12/06/2022   Procedure: TOTAL HIP ARTHROPLASTY ANTERIOR APPROACH;  Surgeon: Samson Frederic, MD;  Location: [facility] OR;  Service: Orthopedics;  Laterality: Right;     Social History:   reports that he has never smoked. He has never used smokeless tobacco. He reports that he does not drink alcohol and does not use drugs.   Family History:  His family history includes Healthy in his father; Hypertension in his brother, mother, and sister.   Allergies No Known Allergies   Home  Medications  Prior to Admission medications   Medication Sig Start Date End Date Taking? Authorizing Provider  Accu-Chek Softclix Lancets lancets Use to test blood glucose four times daily as directed. 05/26/21   Leroy Sea, MD  albuterol (VENTOLIN HFA) 108 (90 Base) MCG/ACT inhaler Inhale 2 puffs into the lungs every 6 (six) hours as needed for wheezing or shortness of breath. 08/19/22   [provider]  apixaban (ELIQUIS) 2.5 MG TABS tablet Take 1 tablet (2.5 mg total) by mouth 2 (two) times daily. 12/08/22   Clois Dupes, PA-C  atorvastatin (LIPITOR) 40 MG tablet Take 1 tablet (40 mg total) by mouth daily at 6 PM. Patient not taking: Reported on 12/12/2022 09/21/22   Narda Bonds, MD  Blood Glucose Monitoring Suppl (BLOOD GLUCOSE MONITOR SYSTEM) w/Device KIT Use up to four times daily as directed. 05/26/21   Leroy Sea, MD  Brinzolamide-Brimonidine 1-0.2 % SUSP Place 1 drop into both eyes in the morning and at bedtime. 11/03/21   [provider]  calcitRIOL (ROCALTROL) 0.25 MCG capsule Take 13 capsules (3.25 mcg total) by mouth every Monday, Wednesday, and Friday with hemodialysis. 12/25/21   Lonia Blood, MD  carvedilol (COREG) 12.5 MG tablet Take 1 tablet (12.5 mg total) by mouth 2 (two) times daily with a meal. 09/28/22   Meriam Sprague, MD  cinacalcet (SENSIPAR) 30 MG tablet Take 3 tablets (90 mg total) by mouth every Monday, Wednesday, and Friday with hemodialysis. 12/25/21   Lonia Blood, MD  ferric gluconate 62.5 mg in sodium chloride 0.9 % 100 mL Inject 62.5 mg into the vein every Wednesday with hemodialysis. 12/30/21   Lonia Blood, MD  gabapentin (NEURONTIN) 300 MG capsule Take 300 mg by mouth daily.    [provider]  glucose blood test strip Use to test blood glucose four times daily as directed 05/26/21   Leroy Sea, MD  HYDROcodone-acetaminophen (NORCO/VICODIN) 5-325 MG tablet Take 1 tablet by mouth every  4 (four) hours  as needed for moderate pain or severe pain. 12/07/22 12/07/23  Clois Dupes, PA-C  latanoprost (XALATAN) 0.005 % ophthalmic solution Place 1 drop into both eyes at bedtime. 05/05/21   [provider]  midodrine (PROAMATINE) 10 MG tablet Take 1 tablet (10 mg total) by mouth every Monday, Wednesday, and Friday. Take 10 mg (1 tablet) by mouth before dialysis and 10 mg (1 tablet) at dialysis as directed. 09/29/22   Meriam Sprague, MD  multivitamin (RENA-VIT) TABS tablet Take 1 tablet by mouth at bedtime. 12/08/22 03/08/23  Narda Bonds, MD  nitroGLYCERIN (NITROSTAT) 0.4 MG SL tablet Place 1 tablet (0.4 mg total) under the tongue every 5 (five) minutes as needed for chest pain (for total of 3 doses at the most). 07/08/22   Zannie Cove, MD  omeprazole (PRILOSEC) 40 MG capsule Take 40 mg by mouth daily. Patient not taking: Reported on 12/12/2022 07/31/15   [provider]  polyethylene glycol powder (MIRALAX) 17 GM/SCOOP powder Take 17 g by mouth daily. Hold for diarrhea (watery stools). 12/08/22   Narda Bonds, MD  sevelamer carbonate (RENVELA) 800 MG tablet Take 2 tablets (1,600 mg total) by mouth 3 (three) times daily with meals. Patient taking differently: Take 1,800 mg by mouth 3 (three) times daily with meals. 05/17/15   Lonia Blood, MD  timolol (TIMOPTIC-XR) 0.5 % ophthalmic gel-forming Place 1 drop into both eyes daily. 11/03/21   [provider]     Critical care time: 37 min    CRITICAL CARE Performed by: Lanier Clam   Total critical care time: 37 minutes  Critical care time was exclusive of separately billable procedures and treating other patients. Critical care was necessary to treat or prevent imminent or life-threatening deterioration.  Critical care was time spent personally by me on the following activities: development of treatment plan with patient and/or surrogate as well as nursing, discussions with consultants, evaluation of patient's  response to treatment, examination of patient, obtaining history from patient or surrogate, ordering and performing treatments and interventions, ordering and review of laboratory studies, ordering and review of radiographic studies, pulse oximetry and re-evaluation of patient's condition.  Tessie Fass MSN, AGACNP-BC [place] [facility] Amion for pager  01/16/2023, 1:14 PM    ,

## 2023-01-16 NOTE — Assessment & Plan Note (Addendum)
Lactate collected this morning, >9 Cardiogenic vs septic WBC up to 24K today and hypoglycemic.  If septic, source either blood or maybe gallbladder.  CXR shows right effusion but no clear pneumonia.  He has no localizing symptoms but abdominal pain  - Follow blood cultures - Start empiric vanc/cefepime/flagyl - Obtain CT abdomen - Bolus fluids - Transfer to ICU, appreciate CCM expertise

## 2023-01-16 NOTE — Progress Notes (Signed)
Pharmacy Antibiotic Note  Travis [name] is a 82 y.o. male admitted on 01/14/2023 with sepsis.  Pharmacy has been consulted for vancomycin and cefepime dosing.  Patient presented with shortness of breath, tachycardia, and an elevated WBC, found to have NSTEMI. Has PMH of ESRD on HD MWF - shortness of breath has mostly resolved after dialysis on 5/3, however, WBC has continued to increase even in setting of reactive leukocytosis due to NSTEMI. Team would like to initiate broad spectrum antibiotics. Lactic acid and blood cultures are both pending.   Patient has not been receiving full HD sessions - will dose vancomycin per levels based on HD schedule while admitted.  Plan: Give vancomycin 1250 mg IV x1 (Vd 0.9 L/kg, wt 64 kg) Dose vancomycin per levels based on HD schedule Start cefepime 1 gm IV q24 hours Monitor clinical status, culture data, and LOT F/u HD schedule on Monday 5/6  Height: 5\' 10"  (177.8 cm) Weight: 64 kg (141 lb) (wt. taken w/ no extra linen on prev. zeroed bed) IBW/kg (Calculated) : 73  Temp (24hrs), Avg:97.9 F (36.6 C), Min:97.8 F (36.6 C), Max:97.9 F (36.6 C)  Recent Labs  Lab 01/14/23 1841 01/15/23 0835 01/16/23 0345  WBC 15.1* 16.7* 24.9*  CREATININE 4.63* 4.55* 6.32*    Estimated Creatinine Clearance: 8.3 mL/min (A) (by C-G formula based on SCr of 6.32 mg/dL (H)).    No Known Allergies  Antimicrobials this admission: cefepime 5/5 >>  vancomycin 5/5 >>   Dose adjustments this admission: N/a  Microbiology results: 5/5 BCx: pending  Thank you for involving pharmacy in this patient's care.   Rockwell Alexandria, PharmD PGY1 Pharmacy Resident 01/16/2023 9:47 AM

## 2023-01-16 NOTE — Progress Notes (Signed)
Initial Nutrition Assessment RD working remotely.   DOCUMENTATION CODES:   Not applicable  INTERVENTION:  - monitor for plans concerning nutrition. - complete NFPE when feasible.   NUTRITION DIAGNOSIS:   Increased nutrient needs related to acute illness, chronic illness as evidenced by estimated needs.  GOAL:   Patient will meet greater than or equal to 90% of their needs  MONITOR:   Labs, Weight trends  REASON FOR ASSESSMENT:   Consult Assessment of nutrition requirement/status  ASSESSMENT:   82 year-old male with medical history of ESRD on HD, HFrEF, HTN, CAD, GERD, arthritis, Sjogren's disease, pulmonary fibrosis, anemia, protein calorie malnutrition, NSTEMI. He presented to the ED on 5/3 due to shortness of breath and was admitted for management of NSTEMI and acute on chronic HF. On 5/5 he was noted to have change in mental status and labs indicated increased troponin, markedly elevated BNP, lactic acid > 9, and elevated LFTs. Cardiology following. Patient was transferred to the ICU on 5/5.  Last documented orientation information was on 5/4 nigh when patient was a/o to self only. He is currently ordered Regular, thin liquids diet to be changed to NPO at midnight tonight. No meal completion percentages documented in the flow sheet.   He was last assessed by a [place] RD on 12/06/22. Patient was ordered Nepro po at that time. NFPE unable to be completed that date due to patient off the floor.   Weight today is 141 lb and weight on 4/3 was 146 lb. This indicates 5 lb / 3.4% wt loss in 1 month; not significant for time frame. Non-pitting edema to BLE documented in the flow sheet.  Per notes: - acute on chronic CHF -- worsening, concern for cardiogenic shock vs sepsis - NSTEMI - CXR showing R pleural effusion, no clear signs of PNA - acute metabolic encephalopathy - transaminitis -- not a surgical candidate   Labs reviewed; CBGs: 38, 132, <10 x3, 24, <10, 175 mg/dl,  Cl: 91 mmol/l, BUN: 47 mg/dl, creatinine: 0.45 mg/dl, Ca: 8.3 mg/dl, LFTs elevated, GFR: 7 ml/min.  Medications reviewed; 90 mg sensipar/day on MWF, 1 tablet renavit/day, 1600 mg renvela TID with meals.  IVF; D10 @ 50 ml/hr (408 kcal/24 hrs).    NUTRITION - FOCUSED PHYSICAL EXAM:  RD working remotely.  Diet Order:   Diet Order             Diet NPO time specified Except for: Sips with Meds  Diet effective midnight           Diet NPO time specified  Diet effective midnight           Diet regular Room service appropriate? Yes; Fluid consistency: Thin  Diet effective now                   EDUCATION NEEDS:   Not appropriate for education at this time  Skin:  Skin Assessment: Reviewed RN Assessment  Last BM:  5/5 (type 7 x1, medium amount, black)  Height:   Ht Readings from Last 1 Encounters:  01/14/23 5\' 10"  (1.778 m)    Weight:   Wt Readings from Last 1 Encounters:  01/16/23 64 kg    BMI:  Body mass index is 20.23 kg/m.  Estimated Nutritional Needs:  Kcal:  1950-2150 kcal Protein:  100-115 grams Fluid:  UOP + 1 L/day    Trenton Gammon, MS, RD, LDN, CNSC Clinical Dietitian PRN/Relief staff On-call/weekend pager # available in [facility]

## 2023-01-16 NOTE — Progress Notes (Signed)
ANTICOAGULATION CONSULT NOTE Pharmacy Consult for Heparin Indication: chest pain/ACS Brief A/P: Heparin level subtherapeutic Increase Heparin rate  No Known Allergies  Patient Measurements: Height: 5\' 10"  (177.8 cm) Weight: 64 kg (141 lb) IBW/kg (Calculated) : 73 Heparin Dosing Weight: 74.8 kg  Vital Signs: Temp: 97.8 F (36.6 C) (05/05 0328) Temp Source: Oral (05/05 0328) BP: 137/84 (05/05 0328) Pulse Rate: 105 (05/04 2239)  Labs: Recent Labs    01/14/23 1841 01/14/23 2052 01/14/23 2330 01/15/23 0101 01/15/23 0835 01/15/23 1809 01/16/23 0345  HGB 8.0*  --   --   --  7.8*  --  8.7*  HCT 26.2*  --   --   --  25.7*  --  27.7*  PLT 191  --   --   --  218  --  192  APTT  --   --  36  --  38*  --   --   HEPARINUNFRC  --    < > <0.10*  --  <0.10* 0.20* 0.23*  CREATININE 4.63*  --   --   --  4.55*  --  6.32*  TROPONINIHS 1,211*   < > 1,697* 1,709* 2,373*  --   --    < > = values in this interval not displayed.     Estimated Creatinine Clearance: 8.3 mL/min (A) (by C-G formula based on SCr of 6.32 mg/dL (H)).   Assessment: 82 y.o. male with NSTEMI for heparin   Goal of Therapy:  Heparin level 0.3-0.7 units/ml Monitor platelets by anticoagulation protocol: Yes   Plan:  Increase Heparin 1400 units/hr Check heparin level in 8 hours.   Geannie Risen, PharmD, BCPS  01/16/2023 4:35 AM

## 2023-01-16 NOTE — Progress Notes (Signed)
Seen and examined on the floor. Transferring to ICU. D/w Drs. Danford and Pemberton.  Steffanie Dunn, DO 01/16/23 11:42 AM [facility]  For contact information, see Amion. If no response to pager, please call PCCM consult pager. After hours, 7PM- 7AM, please call Elink.

## 2023-01-16 NOTE — Progress Notes (Signed)
Progress Note   Patient: Travis [name] OEU:[id number] DOB: July 07, 1941 DOA: 02/11/2023     2 DOS: the patient was seen and examined on 01/16/2023 at 10:20AM      Brief hospital course: Travis [name] is an 82 y.o. 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   5/3[name] to clinic for staple removal after hip surgery, found to be hypoxic --> admitted with working diagnosis CHF; Cardiology and Nephrology consulted in ER 5/4: Echo w/ EF down to 25-30%, RWMA; heparin started 5/5: Hypoglycemic, WBC up to 24K --> empiric Abx started, found to have lactate >9 --> transferred to ICU     Assessment and Plan: * Acute on chronic combined systolic and diastolic CHF (congestive heart failure) (HCC) Initial working diagnosis.  Worsening today, ?cardiogenic shock vs sepsis.  Limited therapeutic options available given his age, ESRD, malnutrition, cardiomyopathy.  - Transfer to ICU, defer inotropes, CRRT to CCM, Cardiology and nephrology    [facility]) Looking worse this morning, WBC up, hypoglycemic, initiated on sepsis pathway and found lactate, >9  Cardiogenic vs septic  If septic, source either blood or maybe gallbladder.  CXR shows right effusion but no clear pneumonia.  He has no localizing symptoms but abdominal pain  - Follow blood cultures - Start empiric vanc/cefepime/flagyl - Obtain CT abdomen - Bolus fluids - Transfer to ICU, appreciate CCM expertise     NSTEMI (non-ST elevated myocardial infarction) (HCC) Trop elevated on admission, echo with RWMA, worsening cardiomyopathy.  Suspected to be NSTEMI.   - Heparin gtt per Cardiology - Continue aspirin, Lipitor for now - BP precludes BB - Defer timing of cath to Cardiology given new shock    End stage renal disease on dialysis [facility]) - Per Nephrology  Acute metabolic encephalopathy At  baseline is interactive and appropriate, no significant cognitive impairment.  Here is disoriented and hallucinating.  Due to CHF, NSTEMI, shock.  Transaminitis LFTs up to the 400s, cholangitis/cholecystitis vs ischemic hepatitis. - CT abdomen ordered STAT - Empiric antibiotics - Not surgical candidate, would need emergent perc drain  Sinus tachycardia This was being atributed to NSTEMI, but appears to be worsening.  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 - Hold home apixaban - Continue heparin gtt  Coronary artery disease See above  Anemia in chronic kidney disease Hgb 8.7, no change  HTN (hypertension) BP normal - Hold Coreg - Continue midodrine  Sjogren's disease (HCC) Old diagnosis.  Not currently on disease active medication          Subjective: Patient clinically worsening today.  Persistently encephalopathic and tachycardic, hallucinating.  Sepsis pathway initiated, found to have significant lactic acidosis.  Critical care consulted, recommended transfer to ICU.     Physical Exam: BP 130/85 (BP Location: Left Arm)   Pulse (!) 104   Temp 99.2 F (37.3 C) (Rectal)   Resp 20   Ht 5\' 10"  (1.778 m)   Wt 64 kg Comment: wt. taken w/ no extra linen on prev. zeroed bed  SpO2 95%   BMI 20.23 kg/m   Elderly adult male, lying in bed, appears weak and debilitated, mostly disoriented and incomprehensible Tachycardic, regular, no peripheral edema, soft systolic murmur Respiratory rate increased,, lung sounds overall diminished but I do not appreciate rales or wheezes Abdomen with new tenderness in the right side, no rigidity,  rebound, no guarding, overall soft Attention diminished, disoriented and hallucinating, severe generalized weakness, face symmetric, speech fluent     Data Reviewed: Discussed with critical care Hyperglycemic Basic metabolic panel shows hyponatremia, creatinine 6.3 consistent with ESRD Hemoglobin 8.7, stable  but white blood cell count up to 24 Chest x-ray personally reviewed, shows stable right pleural effusion, no new opacities CT abdomen and pelvis ordered   Family Communication: Called to daughter, no answer, called to family friend Travis [name], update given.  Spoke with wife later at the bedside.    Disposition: Status is: Inpatient The patient is critically ill with multi-organ failure.  Critical care was necessary to treat or prevent imminent or life-threatening deterioration of sepsis,  cardiac failure,  renal failure and was exclusive of separately billable procedures and treating other patients. Total critical care time spent by me: 60 minutes Time spent personally by me on obtaining history from patient or surrogate, evaluation of the patient, evaluation of patient's response to treatment, ordering and review of laboratory studies, ordering and review of radiographic studies, ordering and performing treatments and interventions, and re-evaluation of the patient's condition.         Author: Alberteen Sam, MD 01/16/2023 1:09 PM  For on call review [email or web address].

## 2023-01-16 NOTE — Assessment & Plan Note (Signed)
LFTs up to the 400s, cholangitis/cholecystitis vs ischemic hepatitis. - CT abdomen ordered STAT - Empiric antibiotics - Not surgical candidate, would need emergent perc drain

## 2023-01-16 NOTE — Progress Notes (Signed)
Pharmacy Antibiotic Note  Travis [name] is a 82 y.o. male admitted on 01/30/2023 with sepsis.  Pharmacy has been consulted for vancomycin and cefepime dosing.  Plans noted for HD on 5/6 and plans have been for HD MWF   Plan: Vancomycin 750mg  IV with HD MWF Will follow HD plans Continue cefepime 1 gm IV q24 hours Monitor clinical status, culture data, and LOT   Height: 5\' 10"  (177.8 cm) Weight: 64 kg (141 lb) (wt. taken w/ no extra linen on prev. zeroed bed) IBW/kg (Calculated) : 73  Temp (24hrs), Avg:98.5 F (36.9 C), Min:97.8 F (36.6 C), Max:99.2 F (37.3 C)  Recent Labs  Lab 01/12/2023 1841 01/15/23 0835 01/16/23 0345 01/16/23 0928  WBC 15.1* 16.7* 24.9*  --   CREATININE 4.63* 4.55* 6.32* 6.95*  LATICACIDVEN  --   --   --  >9.0*     Estimated Creatinine Clearance: 7.5 mL/min (A) (by C-G formula based on SCr of 6.95 mg/dL (H)).    No Known Allergies  Antimicrobials this admission: cefepime 5/5 >>  vancomycin 5/5 >>   Dose adjustments this admission: N/a  Microbiology results: 5/5 BCx: pending  Thank you for involving pharmacy in this patient's care.   Harland German, PharmD Clinical Pharmacist **Pharmacist phone directory can now be found on [email or web address] ([email or web address]).  Listed under [facility].

## 2023-01-16 NOTE — Procedures (Signed)
Central Venous Catheter Insertion Procedure Note  Travis [name]  [id number]  June 08, 1941  Date:01/16/23  Time:1:51 PM   Provider Performing:Mykiah Schmuck Audrie Lia   Procedure: Insertion of Non-tunneled Central Venous Catheter(36556) with US guidance (40981)   Indication(s) Medication administration, measuring CVP, coox  Consent Risks of the procedure as well as the alternatives and risks of each were explained to the patient and/or caregiver.  Consent for the procedure was obtained and is signed in the bedside chart- wife signed consent  Anesthesia Topical only with 1% lidocaine   Timeout Verified patient identification, verified procedure, site/side was marked, verified correct patient position, special equipment/implants available, medications/allergies/relevant history reviewed, required imaging and test results available.  Sterile Technique Maximal sterile technique including full sterile barrier drape, hand hygiene, sterile gown, sterile gloves, mask, hair covering, sterile ultrasound probe cover (if used).RIJ was not visible with [place]-- suspect has had previous dialysis catheters there  Procedure Description Area of catheter insertion was cleaned with chlorhexidine and draped in sterile fashion.  With real-time ultrasound guidance a central venous catheter was placed into the left internal jugular vein. Guidewire confirmed in pulsatile vein before dilation. Nonpulsatile blood flow and easy flushing noted in all ports.  The catheter was sutured in place and sterile dressing applied.       Complications/Tolerance None; patient tolerated the procedure well. Chest X-ray is ordered to verify placement for internal jugular or subclavian cannulation.   Chest x-ray is not ordered for femoral cannulation.  EBL Minimal  Specimen(s) None  Steffanie [facility], DO 01/16/23 1:52 PM [facility]  For contact information, see Amion. If no response to pager, please call PCCM  consult pager. After hours, 7PM- 7AM, please call Elink.

## 2023-01-16 NOTE — Progress Notes (Signed)
Hb 7.2, down 1.5g since this AM -1 unit pRBC since he's on pressors; needs recheck after transfusion -hold heparin -CT didn't show any source of bleeding -high dose PPI empirically -FOBT   Steffanie Dunn, DO 01/16/23 6:53 PM [facility]  For contact information, see Amion. If no response to pager, please call PCCM consult pager. After hours, 7PM- 7AM, please call Elink.

## 2023-01-17 ENCOUNTER — Inpatient Hospital Stay (HOSPITAL_COMMUNITY): Payer: No Typology Code available for payment source

## 2023-01-17 DIAGNOSIS — R6520 Severe sepsis without septic shock: Secondary | ICD-10-CM

## 2023-01-17 DIAGNOSIS — E872 Acidosis, unspecified: Secondary | ICD-10-CM

## 2023-01-17 DIAGNOSIS — N186 End stage renal disease: Secondary | ICD-10-CM | POA: Diagnosis not present

## 2023-01-17 DIAGNOSIS — G9341 Metabolic encephalopathy: Secondary | ICD-10-CM | POA: Diagnosis not present

## 2023-01-17 DIAGNOSIS — A419 Sepsis, unspecified organism: Secondary | ICD-10-CM

## 2023-01-17 DIAGNOSIS — I5043 Acute on chronic combined systolic (congestive) and diastolic (congestive) heart failure: Secondary | ICD-10-CM | POA: Diagnosis not present

## 2023-01-17 DIAGNOSIS — I214 Non-ST elevation (NSTEMI) myocardial infarction: Secondary | ICD-10-CM | POA: Diagnosis not present

## 2023-01-17 LAB — CBC
HCT: 26.9 % — ABNORMAL LOW (ref 39.0–52.0)
Hemoglobin: 8.6 g/dL — ABNORMAL LOW (ref 13.0–17.0)
MCH: 30.9 pg (ref 26.0–34.0)
MCHC: 32 g/dL (ref 30.0–36.0)
MCV: 96.8 fL (ref 80.0–100.0)
Platelets: 196 10*3/uL (ref 150–400)
RBC: 2.78 MIL/uL — ABNORMAL LOW (ref 4.22–5.81)
RDW: 18.8 % — ABNORMAL HIGH (ref 11.5–15.5)
WBC: 18.1 10*3/uL — ABNORMAL HIGH (ref 4.0–10.5)
nRBC: 15.8 % — ABNORMAL HIGH (ref 0.0–0.2)

## 2023-01-17 LAB — GLUCOSE, CAPILLARY
Glucose-Capillary: 85 mg/dL (ref 70–99)
Glucose-Capillary: 56 mg/dL — ABNORMAL LOW (ref 70–99)
Glucose-Capillary: 57 mg/dL — ABNORMAL LOW (ref 70–99)
Glucose-Capillary: 39 mg/dL — CL (ref 70–99)
Glucose-Capillary: 77 mg/dL (ref 70–99)
Glucose-Capillary: 90 mg/dL (ref 70–99)

## 2023-01-17 LAB — MAGNESIUM
Magnesium: 2 mg/dL (ref 1.7–2.4)
Magnesium: 1.9 mg/dL (ref 1.7–2.4)

## 2023-01-17 LAB — BASIC METABOLIC PANEL
Anion gap: 18 — ABNORMAL HIGH (ref 5–15)
BUN: 66 mg/dL — ABNORMAL HIGH (ref 8–23)
CO2: 22 mmol/L (ref 22–32)
Calcium: 7.5 mg/dL — ABNORMAL LOW (ref 8.9–10.3)
Chloride: 92 mmol/L — ABNORMAL LOW (ref 98–111)
Creatinine, Ser: 7.34 mg/dL — ABNORMAL HIGH (ref 0.61–1.24)
GFR, Estimated: 7 mL/min — ABNORMAL LOW (ref >60)
Glucose, Bld: 89 mg/dL (ref 70–99)
Potassium: 4.3 mmol/L (ref 3.5–5.1)
Sodium: 132 mmol/L — ABNORMAL LOW (ref 135–145)

## 2023-01-17 LAB — PHOSPHORUS
Phosphorus: 3.3 mg/dL (ref 2.5–4.6)
Phosphorus: 4 mg/dL (ref 2.5–4.6)

## 2023-01-17 LAB — LACTIC ACID, PLASMA: Lactic Acid, Venous: 1.4 mmol/L (ref 0.5–1.9)

## 2023-01-17 LAB — CULTURE, BLOOD (ROUTINE X 2): Culture: NO GROWTH

## 2023-01-17 LAB — BPAM RBC: ISSUE DATE / TIME: 202405052218

## 2023-01-17 LAB — TYPE AND SCREEN

## 2023-01-17 MED ORDER — VITAL 1.5 CAL PO LIQD: 1000.0000 mL | ORAL | Status: DC

## 2023-01-17 MED ORDER — ASPIRIN 81 MG PO CHEW
81.0000 mg | CHEWABLE_TABLET | Freq: Every day | ORAL | Status: DC
  Administered 2023-01-17 – 2023-01-18 (×2): 81 mg
  Filled 2023-01-17 (×2): qty 1

## 2023-01-17 MED ORDER — SODIUM CHLORIDE 0.9 % IV SOLN
1.0000 g | Freq: Every day | INTRAVENOUS | Status: DC
  Administered 2023-01-17: 1 g via INTRAVENOUS
  Filled 2023-01-17 (×2): qty 10

## 2023-01-17 MED ORDER — DEXTROSE 50 % IV SOLN
25.0000 g | INTRAVENOUS | Status: AC
  Administered 2023-01-17: 25 g via INTRAVENOUS

## 2023-01-17 MED ORDER — VITAL HIGH PROTEIN PO LIQD: 1000.0000 mL | ORAL | Status: DC

## 2023-01-17 MED ORDER — VITAL 1.5 CAL PO LIQD
1000.0000 mL | ORAL | Status: DC
  Administered 2023-01-17 – 2023-01-18 (×2): 1000 mL
  Filled 2023-01-17 (×3): qty 1000

## 2023-01-17 MED ORDER — DEXTROSE 50 % IV SOLN
INTRAVENOUS | Status: AC
  Filled 2023-01-17: qty 50

## 2023-01-17 MED ORDER — HEPARIN SODIUM (PORCINE) 5000 UNIT/ML IJ SOLN
5000.0000 [IU] | Freq: Three times a day (TID) | INTRAMUSCULAR | Status: DC
  Administered 2023-01-17 – 2023-01-18 (×3): 5000 [IU] via SUBCUTANEOUS
  Filled 2023-01-17 (×3): qty 1

## 2023-01-17 MED ORDER — PROSOURCE TF20 ENFIT COMPATIBL EN LIQD
60.0000 mL | Freq: Every day | ENTERAL | Status: DC
  Administered 2023-01-18: 60 mL
  Filled 2023-01-17: qty 60

## 2023-01-17 NOTE — Progress Notes (Signed)
[facility] Progress Note    P HD: [facility] MWF 3.5h  400/1.5   67.4kg   2/2 bath  LFA AVG  Heparin none - last HD 5/03, post wt 66.7kg - rocaltrol 2.5 mcg po tiw - sensipar 150mg  po tiw - mircera 200 mcg IV q 2wks, last 4/24, due 5/08     Assessment/ Plan: Leukocytosis-WBC 24.9. BC and lactate pending per primary.  Acute hypoxic resp failure - w/ pulm edema on CXR and also ^trops c/w possible NSTEMI. Went for HD overnight w/ 2L off. Is on RA now and not hypoxic. Possibly this is flash pulm edema from nstemi. Exam is normal this am. Will get f/u CXR.   NSTEMI, cardiology consulting. 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 Had usual HD yesterday at his OP unit. Then was admitted and had another HD overnight as above. HD 01/17/2023.  Seen on HD 3K bath 2L net UF as tolerated Rt FAL, had to recannulate but back on  HTN/ volume - needs good standing wt or good bed weight. No edema on exam this am. BP's are wnl.  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 Follow.  MBD ckd - CCa and phos are in range. Cont renvela as binder w/ sensipar and po vdra.  HFrEF -Repeat echo 01/15/23 25-30%.  H/o DVT - sp IVC filter. Takes eliquis at home, IV heparin gtt off due to concern of bleeding H/o hypotension - takes midodrine pre HD on mwf.    Subjective: seen in room. Very somnolent -> FS <50 -> 1amp D50.  Objective Vitals:   01/17/23 1100 01/17/23 1115 01/17/23 1130 01/17/23 1143  BP: (!) 141/86 138/77 137/79   Pulse: 81 78 79   Resp: (!) 38 (!) 23 (!) 21   Temp:    97.7 F (36.5 C)  TempSrc:    Oral  SpO2: 100% 100% 100%   Weight:      Height:       Physical Exam General: Chronically ill very thin male in NAD Heart: S1,S2 2/6 systolic M. No R/G SR on monitor.  Lungs: CTAB decreased in bases. No WOB.  Abdomen: NABS, non tender Extremities: No LE edema Dialysis Access: L AVG + T/B   Additional Objective Labs: Basic Metabolic Panel: Recent Labs  Lab 01/15/23 0835  01/16/23 0345 01/16/23 0928 01/17/23 0423 01/17/23 1003  NA 135 133* 136 132*  --   K 3.8 4.5 5.1 4.3  --   CL 90* 92* 91* 92*  --   CO2 26 21* 16* 22  --   GLUCOSE 99 109* 144* 89  --   BUN 20 41* 47* 66*  --   CREATININE 4.55* 6.32* 6.95* 7.34*  --   CALCIUM 8.5* 8.3* 8.3* 7.5*  --   PHOS 3.6  --   --   --  3.3   Liver Function Tests: Recent Labs  Lab 01/15/23 0835 01/16/23 0928  AST  --  492*  ALT  --  403*  ALKPHOS  --  86  BILITOT  --  1.3*  PROT  --  7.4  ALBUMIN 3.0* 2.8*   Recent Labs  Lab 02/11/2023 1841 01/16/23 1039  LIPASE 32 40   CBC: Recent Labs  Lab 01/17/2023 1841 01/15/23 0835 01/16/23 0345 01/16/23 1636 01/17/23 0423  WBC 15.1* 16.7* 24.9* 24.1* 18.1*  NEUTROABS 12.2*  --   --   --   --   HGB 8.0* 7.8*  8.7* 7.2* 8.6*  HCT 26.2* 25.7* 27.7* 24.4* 26.9*  MCV 99.6 98.5 96.9 103.0* 96.8  PLT 191 218 192 205 196   Blood Culture    Component Value Date/Time   SDES BLOOD LEFT HAND 01/16/2023 0948   SPECREQUEST  01/16/2023 0948    BOTTLES DRAWN AEROBIC AND ANAEROBIC Blood Culture adequate volume   CULT  01/16/2023 0948    NO GROWTH < 24 HOURS Performed at [facility], [address] [place], [place] [address]    REPTSTATUS PENDING 01/16/2023 [phone]    Cardiac Enzymes: No results for input(s): "CKTOTAL", "CKMB", "CKMBINDEX", "TROPONINI" in the last 168 hours. CBG: Recent Labs  Lab 01/16/23 1140 01/16/23 1142 01/16/23 1214 01/16/23 1223 01/17/23 1139  GLUCAP <10* 24* <10* 175* 39*   Iron Studies: No results for input(s): "IRON", "TIBC", "TRANSFERRIN", "FERRITIN" in the last 72 hours. @lablastinr3 @ Studies/Results: CT ABDOMEN PELVIS W CONTRAST  Result Date: 01/16/2023 CLINICAL DATA:  Sepsis EXAM: CT ABDOMEN AND PELVIS WITH CONTRAST TECHNIQUE: Multidetector CT imaging of the abdomen and pelvis was performed using the standard protocol following bolus administration of intravenous contrast. RADIATION DOSE REDUCTION: This exam was  performed according to the departmental dose-optimization program which includes automated exposure control, adjustment of the mA and/or kV according to patient size and/or use of iterative reconstruction technique. CONTRAST:  75mL OMNIPAQUE IOHEXOL 350 MG/ML SOLN COMPARISON:  05/23/2021, 01/16/2023 FINDINGS: Lower chest: The heart is enlarged without pericardial effusion. There are small loculated bilateral pleural effusions and pleural thickening. Bibasilar bronchial wall thickening. Hepatobiliary: No focal liver abnormality. Calcified gallstones are identified without gallbladder wall thickening. No biliary duct dilation. Pancreas: Unremarkable. No pancreatic ductal dilatation or surrounding inflammatory changes. Spleen: Normal in size without focal abnormality. Adrenals/Urinary Tract: Stable cystic changes in bilateral renal cortical thinning. No specific imaging follow-up is recommended. No urinary tract calculi or obstructive uropathy. Bladder is decompressed, limiting its evaluation. The adrenals are stable. Stomach/Bowel: No bowel obstruction or ileus. Normal appendix right lower quadrant. Scattered colonic diverticulosis without evidence of acute diverticulitis. No bowel wall thickening or inflammatory change. Vascular/Lymphatic: Aortic atherosclerosis. Stable IVC filter. No enlarged abdominal or pelvic lymph nodes. Reproductive: Prostate is enlarged. Evaluation limited due to streak artifact from right hip arthroplasty. Other: Trace free fluid within the upper abdomen. No free intraperitoneal gas. No abdominal wall hernia. Musculoskeletal: Right hip arthroplasty. No acute or destructive bony abnormalities. IMPRESSION: 1. Bilateral loculated pleural effusions and associated pleural thickening. 2. Marked cardiomegaly. 3. Cholelithiasis without CT evidence of acute cholecystitis. 4. Trace upper abdominal ascites. 5. Colonic diverticulosis without diverticulitis. 6. Stable enlarged prostate. 7. Stable  bilateral renal cortical thinning and cystic change, consistent with chronic renal insufficiency. 8.  Aortic Atherosclerosis (ICD10-I70.0). Electronically Signed   By: Sharlet Salina M.D.   On: 01/16/2023 18:28   DG CHEST PORT 1 VIEW  Result Date: 01/16/2023 CLINICAL DATA:  Leukocytosis. EXAM: PORTABLE CHEST 1 VIEW COMPARISON:  01/18/2023 and older studies. FINDINGS: Cardiac silhouette mildly enlarged.  No mediastinal hilar masses. Lungs hyperexpanded. Interstitial thickening is noted in the mid to lower lungs. Oval opacity in the right lower lung is consistent with loculated fluid along the right oblique fissure, present on the CT from 09/19/2022. Small pleural effusions suspected. Intact. No pneumothorax. Stable right subclavian region stent. Skeletal structures are grossly IMPRESSION: 1. No change from the most recent prior exam. 2. Interstitial thickening with small effusions suggest mild congestive heart failure. Stable loculated pleural fluid in the right oblique fissure. 3. Electronically Signed  By: Amie Portland M.D.   On: 01/16/2023 14:11   DG CHEST PORT 1 VIEW  Result Date: 01/16/2023 CLINICAL DATA:  Central line placement. EXAM: PORTABLE CHEST 1 VIEW COMPARISON:  01/16/2023 at 9:56 a.m. and older exams. FINDINGS: New left internal jugular central venous catheter has its tip in the mid superior vena cava. No pneumothorax. No other change from the exam obtained earlier today. IMPRESSION: 1. Left internal jugular central venous catheter tip projects in the mid superior vena cava. No pneumothorax. Electronically Signed   By: Amie Portland M.D.   On: 01/16/2023 14:08   Medications:  sodium chloride     albumin human     ceFEPime (MAXIPIME) IV     metronidazole Stopped (01/17/23 0522)   vancomycin      sodium chloride   Intravenous Once   aspirin EC  81 mg Oral Daily   atorvastatin  40 mg Oral Daily   calcitRIOL  3.25 mcg Oral Q M,W,F-HD   Chlorhexidine Gluconate Cloth  6 each Topical Q0600    cinacalcet  90 mg Oral Q M,W,F-HD   dextrose  25 g Intravenous STAT   feeding supplement (PROSource TF20)  60 mL Per Tube Daily   feeding supplement (VITAL HIGH PROTEIN)  1,000 mL Per Tube Q24H   heparin injection (subcutaneous)  5,000 Units Subcutaneous Q8H   midodrine  10 mg Oral Q M,W,F   multivitamin  1 tablet Oral QHS   pantoprazole (PROTONIX) IV  40 mg Intravenous Q12H   sevelamer carbonate  1,600 mg Oral TID WC   sodium chloride flush  3 mL Intravenous Q12H

## 2023-01-17 NOTE — Procedures (Signed)
Cortrak  Person Inserting Tube:  Artrice Kraker T, RD Tube Type:  Cortrak - 43 inches Tube Size:  10 Tube Location:  Left nare Secured by: Bridle Technique Used to Measure Tube Placement:  Marking at nare/corner of mouth Cortrak Secured At:  68 cm   Cortrak Tube Team Note:  Consult received to place a Cortrak feeding tube.   X-ray is required, abdominal x-ray has been ordered by the Cortrak team. Please confirm tube placement before using the Cortrak tube.   If the tube becomes dislodged please keep the tube and contact the Cortrak team at [email or web address] for replacement.  If after hours and replacement cannot be delayed, place a NG tube and confirm placement with an abdominal x-ray.    Ayinde Swim RD, LDN For contact information, refer to AMiON.   

## 2023-01-17 NOTE — Progress Notes (Signed)
Rounding Note    Patient Name: Travis [name] Date of Encounter: 01/17/2023  [facility] Cardiologist: Meriam Sprague, MD   Subjective   Complaining of throat pain and mild SOB this morning. No chest pain.   CT chest with concern for multifocal PNA Received HD yesterday with 1800 fluid removed CVP 8  Inpatient Medications    Scheduled Meds:  sodium chloride   Intravenous Once   aspirin  81 mg Per Tube Daily   atorvastatin  40 mg Oral Daily   calcitRIOL  3.25 mcg Oral Q M,W,F-HD   Chlorhexidine Gluconate Cloth  6 each Topical Q0600   cinacalcet  90 mg Oral Q M,W,F-HD   dextrose       feeding supplement (PROSource TF20)  60 mL Per Tube Daily   heparin injection (subcutaneous)  5,000 Units Subcutaneous Q8H   midodrine  10 mg Oral Q M,W,F   multivitamin  1 tablet Oral QHS   pantoprazole (PROTONIX) IV  40 mg Intravenous Q12H   sevelamer carbonate  1,600 mg Oral TID WC   sodium chloride flush  3 mL Intravenous Q12H   Continuous Infusions:  sodium chloride     albumin human     ceFEPime (MAXIPIME) IV 1 g (01/17/23 1841)   feeding supplement (VITAL 1.5 CAL) 1,000 mL (01/17/23 1734)   metronidazole 500 mg (01/17/23 1716)   vancomycin 150 mL/hr at 01/17/23 1700   PRN Meds: sodium chloride, albumin human, dextrose, sodium chloride flush   Vital Signs    Vitals:   01/17/23 1600 01/17/23 1700 01/17/23 1800 01/17/23 1900  BP: 134/84 (!) 143/83 128/76 134/81  Pulse:   87 84  Resp: 19 (!) 22 (!) 25 19  Temp: 98.5 F (36.9 C)     TempSrc: Axillary     SpO2:   100% 100%  Weight:      Height:        Intake/Output Summary (Last 24 hours) at 01/17/2023 2016 Last data filed at 01/17/2023 1700 Gross per 24 hour  Intake 692.63 ml  Output 1800 ml  Net -1107.37 ml       01/17/2023    8:35 AM 01/17/2023    6:00 AM 01/16/2023    5:00 AM  Last 3 Weights  Weight (lbs) 153 lb 10.6 oz 156 lb 8.4 oz 141 lb  Weight (kg) 69.7 kg 71 kg 63.957 kg      Telemetry     NSR - Personally Reviewed  ECG    No new tracing- Personally Reviewed  Physical Exam   GEN: Thin, frail, NAD Neck: CVL in place Cardiac: RRR, 1/6 systolic murmur Respiratory: Clear on anterior exam GI: Soft, nontender, non-distended  MS: No edema; No deformity. Neuro:  Somnolent but following commands and answering questions appropriately Psych: Calm  Labs    High Sensitivity Troponin:   Recent Labs  Lab 01/18/2023 2052 01/20/2023 2330 01/15/23 0101 01/15/23 0835 01/16/23 0928  TROPONINIHS 1,597* 1,697* 1,709* 2,373* 2,679*      Chemistry Recent Labs  Lab 01/15/23 0835 01/16/23 0345 01/16/23 0928 01/17/23 0423 01/17/23 1003 01/17/23 1841  NA 135 133* 136 132*  --   --   K 3.8 4.5 5.1 4.3  --   --   CL 90* 92* 91* 92*  --   --   CO2 26 21* 16* 22  --   --   GLUCOSE 99 109* 144* 89  --   --   BUN 20 41* 47* 66*  --   --  CREATININE 4.55* 6.32* 6.95* 7.34*  --   --   CALCIUM 8.5* 8.3* 8.3* 7.5*  --   --   MG 2.1  --   --   --  2.0 1.9  PROT  --   --  7.4  --   --   --   ALBUMIN 3.0*  --  2.8*  --   --   --   AST  --   --  492*  --   --   --   ALT  --   --  403*  --   --   --   ALKPHOS  --   --  86  --   --   --   BILITOT  --   --  1.3*  --   --   --   GFRNONAA 12* 8* 7* 7*  --   --   ANIONGAP 19* 20* 29* 18*  --   --      Lipids  Recent Labs  Lab 01/15/23 0835  CHOL 119  TRIG 77  HDL 43  LDLCALC 61  CHOLHDL 2.8     Hematology Recent Labs  Lab 01/16/23 0345 01/16/23 1636 01/17/23 0423  WBC 24.9* 24.1* 18.1*  RBC 2.86* 2.37* 2.78*  HGB 8.7* 7.2* 8.6*  HCT 27.7* 24.4* 26.9*  MCV 96.9 103.0* 96.8  MCH 30.4 30.4 30.9  MCHC 31.4 29.5* 32.0  RDW 19.2* 19.6* 18.8*  PLT 192 205 196    Thyroid No results for input(s): "TSH", "FREET4" in the last 168 hours.  BNP Recent Labs  Lab 01/29/2023 1841  BNP >4,500.0*     DDimer No results for input(s): "DDIMER" in the last 168 hours.   Radiology    US Abdomen Limited RUQ (LIVER/GB)  Result  Date: 01/17/2023 CLINICAL DATA:  Abdominal pain. EXAM: ULTRASOUND ABDOMEN LIMITED RIGHT UPPER QUADRANT COMPARISON:  CT abdomen pelvis 01/16/2023 FINDINGS: Gallbladder: There are shadowing gallstones measuring up to 1.2 cm. No gallbladder wall thickening. No sonographic Murphy sign noted by sonographer. Common bile duct: Diameter: 0.2 cm, within normal limits Liver: No focal lesion identified. Within normal limits in parenchymal echogenicity. Portal vein is patent on color Doppler imaging with normal direction of blood flow towards the liver. Other: Echogenic right kidney. IMPRESSION: 1. Cholelithiasis without sonographic evidence of acute cholecystitis. 2. Echogenic right kidney which is nonspecific but can be seen in the setting of medical renal disease. Electronically Signed   By: Emmaline Kluver M.D.   On: 01/17/2023 17:17   CT CHEST WO CONTRAST  Result Date: 01/17/2023 CLINICAL DATA:  Suspected sepsis for evaluation of pneumonia EXAM: CT CHEST WITHOUT CONTRAST TECHNIQUE: Multidetector CT imaging of the chest was performed following the standard protocol without IV contrast. RADIATION DOSE REDUCTION: This exam was performed according to the departmental dose-optimization program which includes automated exposure control, adjustment of the mA and/or kV according to patient size and/or use of iterative reconstruction technique. COMPARISON:  CT chest dated 09/19/2022 FINDINGS: Cardiovascular: Left IJ central venous catheter tip terminates in the lower SVC. Right brachiocephalic vein stent in-situ. Increased multichamber cardiomegaly. No significant pericardial fluid/thickening. Similar dilated pulmonary artery measures 3.6 cm. Extensive coronary artery calcifications. Aortic atherosclerosis. Mediastinum/Nodes: Imaged thyroid gland without nodules meeting criteria for imaging follow-up by size. Normal esophagus. Similar multi station lymphadenopathy, for example pretracheal measures 10 mm (3:49) and left  supraclavicular measures 111 mm (3:18). Lungs/Pleura: The central airways are patent. Trace layering secretions within the trachea. Similar lower lobe  predominant diffuse bronchial wall thickening. Mild interlobular septal thickening is again seen. Multifocal tree-in-bud and ground-glass nodules within the dependent upper lobes are new. Increased bilateral lower lobe, left-greater-than-right consolidative opacities and irregular nodularity. A few scattered cysts are unchanged. No pneumothorax. Similar small bilateral pleural effusions. Unchanged simple fluid attenuation loculated component along the minor fissure and posterior right upper lung. Additional areas of loculated pleural effusion along the left lateral upper lung and major fissure have decreased or resolved. Upper abdomen: Enteric tube reaches the stomach terminates below the field of view. Innumerable bilateral renal cysts are better evaluated on prior CT abdomen and pelvis. Musculoskeletal: No acute or abnormal lytic or blastic osseous lesions. Similar bilateral gynecomastia. IMPRESSION: 1. Increased bilateral lower lobe, left-greater-than-right consolidative opacities and irregular nodularity with new multifocal tree-in-bud and ground-glass nodules within the dependent upper lobes. Findings are suspicious for multifocal pneumonia. 2. Similar small bilateral pleural effusions with simple fluid attenuation loculated component along the minor fissure and posterior right upper lung. Additional areas of loculated pleural effusion along the left lateral upper lung and major fissure have decreased or resolved. 3. Similar multi station lymphadenopathy, likely reactive. 4. Increased multichamber cardiomegaly. 5. Similar dilated pulmonary artery, which can be seen in the setting of pulmonary hypertension. 6. Aortic Atherosclerosis (ICD10-I70.0). Coronary artery calcifications. Assessment for potential risk factor modification, dietary therapy or pharmacologic  therapy may be warranted, if clinically indicated. Electronically Signed   By: Agustin Cree M.D.   On: 01/17/2023 15:37   DG Abd Portable 1V  Result Date: 01/17/2023 CLINICAL DATA:  Feeding tube placement. EXAM: PORTABLE ABDOMEN - 1 VIEW COMPARISON:  KUB 07/31/2015, CT abdomen/pelvis 01/16/2023. FINDINGS: The enteric catheter tip projects over the expected location of the distal stomach. There is a nonobstructive bowel gas pattern. Enteric contrast is noted in the large bowel. An IVC filter is noted. There is no definite free intraperitoneal air, within the confines of supine technique. A left internal jugular venous catheter projects over the mid SVC. The cardiomediastinal silhouette is stable. There is a right pleural effusion with ovoid opacity projecting over the right midlung, unchanged unchanged and again likely reflecting fissural fluid. A small left pleural effusion is not significantly changed. IMPRESSION: Enteric catheter tip in the expected location of the distal stomach. Electronically Signed   By: Lesia Hausen M.D.   On: 01/17/2023 14:27   CT ABDOMEN PELVIS W CONTRAST  Result Date: 01/16/2023 CLINICAL DATA:  Sepsis EXAM: CT ABDOMEN AND PELVIS WITH CONTRAST TECHNIQUE: Multidetector CT imaging of the abdomen and pelvis was performed using the standard protocol following bolus administration of intravenous contrast. RADIATION DOSE REDUCTION: This exam was performed according to the departmental dose-optimization program which includes automated exposure control, adjustment of the mA and/or kV according to patient size and/or use of iterative reconstruction technique. CONTRAST:  75mL OMNIPAQUE IOHEXOL 350 MG/ML SOLN COMPARISON:  05/23/2021, 01/16/2023 FINDINGS: Lower chest: The heart is enlarged without pericardial effusion. There are small loculated bilateral pleural effusions and pleural thickening. Bibasilar bronchial wall thickening. Hepatobiliary: No focal liver abnormality. Calcified gallstones are  identified without gallbladder wall thickening. No biliary duct dilation. Pancreas: Unremarkable. No pancreatic ductal dilatation or surrounding inflammatory changes. Spleen: Normal in size without focal abnormality. Adrenals/Urinary Tract: Stable cystic changes in bilateral renal cortical thinning. No specific imaging follow-up is recommended. No urinary tract calculi or obstructive uropathy. Bladder is decompressed, limiting its evaluation. The adrenals are stable. Stomach/Bowel: No bowel obstruction or ileus. Normal appendix right lower quadrant. Scattered colonic diverticulosis without  evidence of acute diverticulitis. No bowel wall thickening or inflammatory change. Vascular/Lymphatic: Aortic atherosclerosis. Stable IVC filter. No enlarged abdominal or pelvic lymph nodes. Reproductive: Prostate is enlarged. Evaluation limited due to streak artifact from right hip arthroplasty. Other: Trace free fluid within the upper abdomen. No free intraperitoneal gas. No abdominal wall hernia. Musculoskeletal: Right hip arthroplasty. No acute or destructive bony abnormalities. IMPRESSION: 1. Bilateral loculated pleural effusions and associated pleural thickening. 2. Marked cardiomegaly. 3. Cholelithiasis without CT evidence of acute cholecystitis. 4. Trace upper abdominal ascites. 5. Colonic diverticulosis without diverticulitis. 6. Stable enlarged prostate. 7. Stable bilateral renal cortical thinning and cystic change, consistent with chronic renal insufficiency. 8.  Aortic Atherosclerosis (ICD10-I70.0). Electronically Signed   By: Sharlet Salina M.D.   On: 01/16/2023 18:28   DG CHEST PORT 1 VIEW  Result Date: 01/16/2023 CLINICAL DATA:  Leukocytosis. EXAM: PORTABLE CHEST 1 VIEW COMPARISON:  01/28/2023 and older studies. FINDINGS: Cardiac silhouette mildly enlarged.  No mediastinal hilar masses. Lungs hyperexpanded. Interstitial thickening is noted in the mid to lower lungs. Oval opacity in the right lower lung is  consistent with loculated fluid along the right oblique fissure, present on the CT from 09/19/2022. Small pleural effusions suspected. Intact. No pneumothorax. Stable right subclavian region stent. Skeletal structures are grossly IMPRESSION: 1. No change from the most recent prior exam. 2. Interstitial thickening with small effusions suggest mild congestive heart failure. Stable loculated pleural fluid in the right oblique fissure. 3. Electronically Signed   By: Amie Portland M.D.   On: 01/16/2023 14:11   DG CHEST PORT 1 VIEW  Result Date: 01/16/2023 CLINICAL DATA:  Central line placement. EXAM: PORTABLE CHEST 1 VIEW COMPARISON:  01/16/2023 at 9:56 a.m. and older exams. FINDINGS: New left internal jugular central venous catheter has its tip in the mid superior vena cava. No pneumothorax. No other change from the exam obtained earlier today. IMPRESSION: 1. Left internal jugular central venous catheter tip projects in the mid superior vena cava. No pneumothorax. Electronically Signed   By: Amie Portland M.D.   On: 01/16/2023 14:08    Cardiac Studies   Cardiac Studies & Procedures   CARDIAC CATHETERIZATION  CARDIAC CATHETERIZATION 07/06/2022  Narrative   Heavily calcified diffusely diseased vessel: 1st Diag-1 lesion is 99% stenosed. 1st Diag-2 lesion is 60% stenosed. 1st Diag-3 lesion is 95% stenosed.   The left ventricular systolic function is normal. The left ventricular ejection fraction is 50-55% by visual estimate.   LV end diastolic pressure is moderately elevated.   Hemodynamic findings consistent with mild pulmonary hypertension.  POSTOP DIAGNOSES Moderate-severe 2-vessel single-vessel disease: Proximal D1-99%, mid 60%, distal 95% (heavily calcified/diffusely diseased; not favorable for PCI would require atherectomy and a relatively small caliber vessel) & 60% ostial sidebranch of OM1 followed by a 60% mid vessel then distal occlusion (with right to left collaterals). Otherwise mild  diffuse disease/calcified vessels Mostly preserved LVEF, cannot exclude anterolateral hypokinesis. LVEDP moderately elevated at 19 mmHg (LVEDP 166/5 mmHg) with a PCWP of ~22 mmHg. RHC numbers: Mean RAP 6 mmHg, RVP-EDP 51/0-5 mmHg; PAP-mean 50/13-27 mmHg; PCWP 17-26 mmHg (~22 mmHg); Ao sat 99%, PA sat 74%. CO-CI: Fick -> 8.22, 4.44; thermal 5.37-2.7 (suspect that the discrepancy is related to AV fistula)   RECOMMENDATIONS Would recommend medical management for heavily diseased D1 branch. This is heavily calcified vessel with diffuse disease, will require atherectomy and extensive stent placement in the vessel is roughly 2 to 2.25 mm distally.  Not favorable for PCI. Consider slightly increased  volume removal and HD based on elevated filling pressures. Return to nursing for ongoing care    Bryan Lemma, MD  Findings Coronary Findings Diagnostic  Dominance: Co-dominant  Left Anterior Descending Vessel is normal in caliber. There is mild diffuse disease throughout the vessel. The vessel is mildly calcified.  First Diagonal Branch Vessel is small in size. There is moderate  diffuse disease in the vessel. Diffuse calcification 1st Diag-1 lesion is 99% stenosed. Vessel is the culprit lesion. The lesion is eccentric. The lesion is severely calcified. 1st Diag-2 lesion is 60% stenosed. The lesion is concentric. The lesion is moderately calcified. 1st Diag-3 lesion is 95% stenosed. The lesion is segmental and concentric. The lesion is moderately calcified.  Lateral First Diagonal Branch Vessel is small in size.  Second Diagonal Branch Vessel is small in size.  Left Circumflex Vessel is normal in caliber and large. The vessel exhibits minimal luminal irregularities.  First Obtuse Marginal Branch Vessel is large in size. The vessel exhibits minimal luminal irregularities.  Lateral First Obtuse Marginal Branch Vessel is small in size.  Second Obtuse Marginal Branch Vessel is  small in size.  First Left Posterolateral Branch Vessel is small in size.  Second Left Posterolateral Branch Vessel is small in size.  Third Left Posterolateral Branch Vessel is small in size.  Left Posterior Atrioventricular Artery Vessel is large in size.  Right Coronary Artery Vessel was injected. Vessel is small. There is mild diffuse disease throughout the vessel. The vessel is mildly calcified.  Acute Marginal Branch Vessel is small in size.  Right Ventricular Branch  Right Posterior Descending Artery Vessel is small in size.  First Right Posterolateral Branch Vessel is moderate in size.  Intervention  No interventions have been documented.   STRESS TESTS  MYOCARDIAL PERFUSION IMAGING 01/12/2021  ECHOCARDIOGRAM  ECHOCARDIOGRAM COMPLETE 01/15/2023  Narrative ECHOCARDIOGRAM REPORT    Patient Name:   Travis [name] Date of Exam: 01/15/2023 Medical Rec #:  [id number]       Height:       70.0 in Accession #:    [id number]      Weight:       165.0 lb Date of Birth:  Jun 14, 1941       BSA:          1.923 m Patient Age:    82 years        BP:           125/83 mmHg Patient Gender: M               HR:           109 bpm. Exam Location:  Inpatient  Procedure: 2D Echo, Color Doppler, Cardiac Doppler and Intracardiac Opacification Agent  Indications:    I50.21 Acute systolic (congestive) heart failure  History:        Patient has prior history of Echocardiogram examinations, most recent 07/06/2022. CHF, CAD; Risk Factors:Hypertension.  Sonographer:    Irving Burton Senior RDCS Referring Phys: Darlin Drop   Sonographer Comments: Apical window foreshortened due to thin body habitus. IMPRESSIONS   1. Left ventricular ejection fraction, by estimation, is 25 to 30%. The left ventricle has severely decreased function. The left ventricle demonstrates regional wall motion abnormalities (see scoring diagram/findings for description). The left ventricular internal cavity size  was moderately to severely dilated. There is moderate asymmetric left ventricular hypertrophy of the infero-lateral segment. Indeterminate diastolic filling due to E-A fusion. 2. Right ventricular systolic function is low normal. The  right ventricular size is mildly enlarged. There is moderately elevated pulmonary artery systolic pressure. The estimated right ventricular systolic pressure is 55.2 mmHg. 3. Left atrial size was severely dilated. 4. Right atrial size was severely dilated. 5. The mitral valve is degenerative. Moderate mitral valve regurgitation. 6. Tricuspid valve regurgitation is moderate to severe. 7. The aortic valve is tricuspid. There is moderate calcification of the aortic valve. There is moderate thickening of the aortic valve. Aortic valve regurgitation is mild to moderate. Likely moderate vs mild to moderate low flow, low gradient AS with AVA 1.1cm2, mean gradient 14, Vmax 2.33, DI 0.37. SVi low at 20.Marland Kitchen Aortic valve mean gradient measures 14.0 mmHg. Aortic valve Vmax measures 2.33 m/s. 8. The inferior vena cava is dilated in size with <50% respiratory variability, suggesting right atrial pressure of 15 mmHg.  Comparison(s): Compared to prior TTE on 06/2022, the LVEF has dropped from 45-50% to 25-30% with WMA as described. The MR and TR are worse in the setting of elevated filling pressures.  FINDINGS Left Ventricle: Left ventricular ejection fraction, by estimation, is 25 to 30%. The left ventricle has severely decreased function. The left ventricle demonstrates regional wall motion abnormalities. Definity contrast agent was given IV to delineate the left ventricular endocardial borders. The left ventricular internal cavity size was moderately to severely dilated. There is moderate asymmetric left ventricular hypertrophy of the infero-lateral segment. Indeterminate diastolic filling due to E-A fusion.   LV Wall Scoring: The entire anterior wall, mid and distal lateral wall,  entire septum, entire inferior wall, and mid anterolateral segment are hypokinetic. The basal inferolateral segment, basal anterolateral segment, and apex are normal.  Right Ventricle: The right ventricular size is mildly enlarged. No increase in right ventricular wall thickness. Right ventricular systolic function is low normal. There is moderately elevated pulmonary artery systolic pressure. The tricuspid regurgitant velocity is 3.17 m/s, and with an assumed right atrial pressure of 15 mmHg, the estimated right ventricular systolic pressure is 55.2 mmHg.  Left Atrium: Left atrial size was severely dilated.  Right Atrium: Right atrial size was severely dilated.  Pericardium: There is no evidence of pericardial effusion.  Mitral Valve: The mitral valve is degenerative in appearance. There is mild thickening of the mitral valve leaflet(s). There is mild calcification of the mitral valve leaflet(s). Moderate mitral valve regurgitation.  Tricuspid Valve: The tricuspid valve is normal in structure. Tricuspid valve regurgitation is moderate to severe. The flow in the hepatic veins is blunted (decreased) during ventricular systole.  Aortic Valve: The aortic valve is tricuspid. There is moderate calcification of the aortic valve. There is moderate thickening of the aortic valve. Aortic valve regurgitation is mild to moderate. Aortic regurgitation PHT measures 262 msec. Likely moderate vs mild to moderate low flow, low gradient AS with AVA 1.1cm2, mean gradient 14, Vmax 2.33, DI 0.37. SVi low at 20. Aortic valve mean gradient measures 14.0 mmHg. Aortic valve peak gradient measures 21.7 mmHg. Aortic valve area, by VTI measures 1.08 cm.  Pulmonic Valve: The pulmonic valve was grossly normal. Pulmonic valve regurgitation is mild.  Aorta: The aortic root and ascending aorta are structurally normal, with no evidence of dilitation.  Venous: A systolic blunting flow pattern is recorded from the left upper  pulmonary vein and the right lower pulmonary vein. The inferior vena cava is dilated in size with less than 50% respiratory variability, suggesting right atrial pressure of 15 mmHg.  IAS/Shunts: No atrial level shunt detected by color flow Doppler.   LEFT  VENTRICLE PLAX 2D LVIDd:         5.10 cm      Diastology LVIDs:         4.30 cm      LV e' medial:    7.40 cm/s LV PW:         1.40 cm      LV E/e' medial:  16.1 LV IVS:        0.80 cm      LV e' lateral:   11.00 cm/s LVOT diam:     2.00 cm      LV E/e' lateral: 10.8 LV SV:         38 LV SV Index:   20 LVOT Area:     3.14 cm  LV Volumes (MOD) LV vol d, MOD A2C: 196.0 ml LV vol d, MOD A4C: 135.0 ml LV vol s, MOD A2C: 139.0 ml LV vol s, MOD A4C: 85.5 ml LV SV MOD A2C:     57.0 ml LV SV MOD A4C:     135.0 ml LV SV MOD BP:      59.3 ml  RIGHT VENTRICLE RV S prime:     9.57 cm/s TAPSE (M-mode): 1.6 cm  LEFT ATRIUM              Index        RIGHT ATRIUM           Index LA diam:        3.40 cm  1.77 cm/m   RA Area:     23.80 cm LA Vol (A2C):   110.0 ml 57.19 ml/m  RA Volume:   84.40 ml  43.88 ml/m LA Vol (A4C):   82.6 ml  42.94 ml/m LA Biplane Vol: 96.1 ml  49.96 ml/m AORTIC VALVE AV Area (Vmax):    1.16 cm AV Area (Vmean):   1.08 cm AV Area (VTI):     1.08 cm AV Vmax:           233.00 cm/s AV Vmean:          178.000 cm/s AV VTI:            0.352 m AV Peak Grad:      21.7 mmHg AV Mean Grad:      14.0 mmHg LVOT Vmax:         85.90 cm/s LVOT Vmean:        61.300 cm/s LVOT VTI:          0.121 m LVOT/AV VTI ratio: 0.34 AI PHT:            262 msec  AORTA Ao Root diam: 3.20 cm Ao Asc diam:  3.60 cm  MITRAL VALVE                  TRICUSPID VALVE MV Area (PHT): 4.99 cm       TR Peak grad:   40.2 mmHg MV Decel Time: 152 msec       TR Vmax:        317.00 cm/s MR Peak grad:    82.1 mmHg MR Mean grad:    55.0 mmHg    SHUNTS MR Vmax:         453.00 cm/s  Systemic VTI:  0.12 m MR Vmean:        354.0 cm/s   Systemic  Diam: 2.00 cm MR PISA:         1.01 cm MR PISA Eff ROA: 9 mm  MR PISA Radius:  0.40 cm MV E velocity: 119.00 cm/s  Laurance Flatten MD Electronically signed by Laurance Flatten MD Signature Date/Time: 01/15/2023/11:16:53 AM    Final              Patient Profile     82 y.o. 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, multivessel CAD, and history of DVT who presented to the ER with chest pain and SOB found to have elevated trop  for which Cardiology was consulted  Assessment & Plan    #Sepsis: #Multifocal PNA: #Lactic Acidosis: -Patient with lactate >9 on 01/16/23 with somnolence on exam prompting transfer to ICU -On transfer to ICU CVP 6, co-ox 78 consistent with sepsis picture -CT chest 5/6 with concern for multifocal PNA -Now on vanc/cefepime/flagyl  -Lactate normal this morning -Management per ICU team  #NSTEMI: #Known Multivessel CAD: -Patient presented with worsening dyspnea and chest pain found to have trop [id number] and BNP >4500 from 366 on prior labs -Cath 06/2022 with 99% D1, 60% D2, 95% D3 with heavily calcified/diffuse disease that was managed medically  -TTE with EF 25-30%, severe septal, mid-to-apical lateral, mid-to-apical anterior and mid-to- apical inferior hypokinesis with relatively preserved LV apex systolic motion, moderate MR, moderate to severe TR, mild to mod AS, mild to moderate AR -Cath deferred for now given concern for sepsis as above -Will re-consider ischemic evaluation once clinically improves -Heparin gtt held due to drop in H/H now s/p 1u pRBC with improvement -Continue ASA 81mg  daily -Continue lipitor 40mg  daily  #Acute on Chronic Systolic HF Exacerbation: -BNP >4500 (from 365 in 11/2022) on admission with pulmonary edema on CXR consistent with acute on chronic systolic HF exacerbation -On volume management with HD -GDMT limited due to ESRD and hypotension requiring midodrine on HD  days  #Acute on Chronic Hypoxic Respiratory Failure: -Likely multifactorial in the setting of volume overload on admission and multifocal PNA on CT chest -Will manage as above  #HTN: -Holding nodal agents for now given concern for shock  #ESRD: -Management per Nephrology  #HLD: -Continue lipitor 40mg  daily  #GOC: -Discussion held with the daughter and the patient's wife and they wish to continue full scope of care with invasive measures if needed  CRITICAL CARE TIME: I have spent a total of 40 minutes with patient reviewing hospital notes, telemetry, EKGs, labs and examining the patient as well as establishing an assessment and plan that was discussed with the patient.  > 50% of time was spent in direct patient care. The patient is critically ill with multi-organ system failure and requires high complexity decision making for assessment and support, frequent evaluation and titration of therapies, application of advanced monitoring technologies and extensive interpretation of multiple databases.   For questions or updates, please contact  [facility] Please consult [email or web address] for contact info under        Signed, Meriam Sprague, MD  01/17/2023, 8:16 PM

## 2023-01-17 NOTE — TOC Initial Note (Signed)
Transition of Care [facility]) - Initial/Assessment Note    Patient Details  Name: Travis [name] MRN: [id number] Date of Birth: July 13, 1941  Transition of Care [facility]) CM/SW Contact:    Elliot Cousin, RN Phone Number: [phone] 01/17/2023, 5:48 PM  Clinical Narrative:                  CM spoke to pt's dtr. States pt had [facility] in the past. She wanted pt to dc home with hospital bed and oxygen this admission. Reports pt has an aide that come 5 days per week on TTH 9-5 pm and MWF 1-5 pm. Will continue to follow for dc needs.     Expected Discharge Plan: Home w Home Health Services Barriers to Discharge: (P) Continued Medical Work up   Patient Goals and CMS Choice Patient states their goals for this hospitalization and ongoing recovery are:: daughter want pt to recover and come home with Home Health CMS Medicare.gov Compare Post Acute Care list provided to:: (P) Patient Represenative (must comment) (wife- Jaydan Lotito) Choice offered to / list presented to : Adult Children      Expected Discharge Plan and Services   Discharge Planning Services: CM Consult Post Acute Care Choice: Home Health Living arrangements for the past 2 months: Single Family Home                                      Prior Living Arrangements/Services Living arrangements for the past 2 months: Single Family Home Lives with:: Spouse, Adult Children Patient language and need for interpreter reviewed:: Yes        Need for Family Participation in Patient Care: Yes (Comment) Care giver support system in place?: Yes (comment) Current home services: DME, Homehealth aide (rolling walker, wheelchair, adjustable bed, aide) Criminal Activity/Legal Involvement Pertinent to Current Situation/Hospitalization: No - Comment as needed  Activities of Daily Living Home Assistive Devices/Equipment: Environmental consultant (specify type), Shower chair with back ADL Screening (condition at time of admission) Patient's  cognitive ability adequate to safely complete daily activities?: Yes Is the patient deaf or have difficulty hearing?: No Does the patient have difficulty seeing, even when wearing glasses/contacts?: Yes Does the patient have difficulty concentrating, remembering, or making decisions?: Yes Patient able to express need for assistance with ADLs?: Yes Does the patient have difficulty dressing or bathing?: Yes Independently performs ADLs?: Yes (appropriate for developmental age) Communication: Independent Dressing (OT): Needs assistance Is this a change from baseline?: Pre-admission baseline Grooming: Needs assistance Is this a change from baseline?: Pre-admission baseline Feeding: Independent Bathing: Needs assistance Is this a change from baseline?: Pre-admission baseline Toileting: Needs assistance Is this a change from baseline?: Pre-admission baseline In/Out Bed: Needs assistance Is this a change from baseline?: Pre-admission baseline Walks in Home: Needs assistance Is this a change from baseline?: Pre-admission baseline Does the patient have difficulty walking or climbing stairs?: Yes Weakness of Legs: Both Weakness of Arms/Hands: None  Permission Sought/Granted Permission sought to share information with : Case Manager, Family Supports Permission granted to share information with : Yes, Verbal Permission Granted  Share Information with NAME: Adahir Yarosh     Permission granted to share info w Relationship: daughter  Permission granted to share info w Contact Information: [phone]  Emotional Assessment   Attitude/Demeanor/Rapport: Lethargic       Psych Involvement: No (comment)  Admission diagnosis:  NSTEMI (non-ST elevated myocardial infarction) (HCC) [I21.4]  Volume overload [E87.70] Acute on chronic congestive heart failure, unspecified heart failure type [facility]) [I50.9] Patient Active Problem List   Diagnosis Date Noted   Shock (HCC) 01/16/2023   Transaminitis  01/16/2023   Lactic acidosis 01/16/2023   Severe sepsis (HCC) 01/16/2023   Encephalopathy acute 01/16/2023   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   Closed right hip fracture, initial encounter (HCC) 12/05/2022   Glaucoma 09/19/2022   Fluid overload 09/19/2022   Influenza A 08/03/2022   Hyperkalemia 08/02/2022   CHF (congestive heart failure) (HCC) 08/02/2022   Coronary artery disease 07/06/2022   Elevated troponin 07/05/2022   Prolonged QT interval 07/05/2022   GERD (gastroesophageal reflux disease) 07/05/2022   NSTEMI (non-ST elevated myocardial infarction) (HCC) 07/04/2022   Delirium 12/22/2021   Acute metabolic encephalopathy 12/20/2021   End stage renal disease on dialysis [facility])    Hypertensive emergency 12/18/2021   Hypervolemia associated with renal insufficiency 04/04/2019   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   Chronic combined systolic (congestive) and diastolic (congestive) heart failure (HCC) 11/05/2015   Chest pain    Thrombocytopenia (HCC) 08/11/2015   FTT (failure to thrive) in adult 08/11/2015   HTN (hypertension) 04/19/2015   Acute on chronic combined systolic and diastolic CHF (congestive heart failure) (HCC) 04/18/2015   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 [address] [place] [address] Phone: [phone] Fax: [phone]  [facility] - [place], [place] - [address] [place] [address] Phone: [phone] Fax: [phone]  [facility] [address] [place] [address] Phone: [phone] Fax: [phone]     Social Determinants of Health (SDOH) Social History: SDOH Screenings   Food Insecurity: No Food Insecurity (02/07/2023)  Housing: Low Risk  (01/30/2023)  Transportation Needs: No Transportation Needs (01/25/2023)  Utilities: Not At Risk (02/01/2023)  Tobacco Use: Low Risk  (01/16/2023)   SDOH Interventions:     Readmission Risk Interventions    07/05/2022   12:00 PM 12/22/2021    5:33 PM 05/26/2021   12:03 PM  Readmission Risk Prevention Plan  Transportation Screening Complete Complete Complete  PCP or Specialist Appt within 3-5 Days  Complete Complete  HRI or Home Care Consult Complete Complete Complete  Social Work Consult for Recovery Care Planning/Counseling Complete Complete Complete  Palliative Care Screening Not Applicable Not Applicable Not Applicable  Medication Review Oceanographer) Referral to Pharmacy Complete Complete

## 2023-01-17 NOTE — Progress Notes (Signed)
   01/17/23 1245  Vitals  Temp 97.9 F (36.6 C)  BP (!) 143/84  MAP (mmHg) 101  BP Location Left Arm  BP Method Automatic  Patient Position (if appropriate) Lying  Pulse Rate 82  Pulse Rate Source Monitor  ECG Heart Rate 81  Resp 18  Oxygen Therapy  SpO2 100 %  O2 Device Nasal Cannula  O2 Flow Rate (L/min) 4 L/min  During Treatment Monitoring  Intra-Hemodialysis Comments Tx completed;Tolerated well  Post Treatment  Dialyzer Clearance Lightly streaked  Duration of HD Treatment -hour(s) 315 hour(s)  Hemodialysis Intake (mL) 0 mL  Liters Processed 69.2  Fluid Removed (mL) 1800 mL  Tolerated HD Treatment Yes  Post-Hemodialysis Comments hd tx completed, finished 15 minutes early due to increase pressure of venous  AVG/AVF Arterial Site Held (minutes) 7 minutes  AVG/AVF Venous Site Held (minutes) 7 minutes  Note  Observations pt alert talking no s/s of distress to note  Fistula / Graft Right Forearm Arteriovenous vein graft  No placement date or time found.   Orientation: Right  Access Location: Forearm  Access Type: Arteriovenous vein graft  Site Condition No complications  Fistula / Graft Assessment Present;Thrill;Bruit  Status Patent   Received patient in bed to unit.  Alert and oriented.  Informed consent signed and in chart.   TX duration:3;15  Patient tolerated well.   Alert, without acute distress.  Hand-off given to patient's nurse.   Access used: RUAG Access issues: high venous pressure  Total UF removed: 1.8L Medication(s) given: D 50 Post HD VS: see above Post HD weight: 67.7kg   [facility]

## 2023-01-17 NOTE — Consult Note (Signed)
NAME:  Avard Janssen, MRN:  [id number], DOB:  1941/03/06, LOS: 3 ADMISSION DATE:  01/16/2023, CONSULTATION DATE:  01/16/23 REFERRING MD:  Maryfrances Bunnell - TRH , CHIEF COMPLAINT:  SOB   History of Present Illness:  82 yo M PMH ESRD, HFrEF, HTN, mvCAD, presented to ED 5/3 with SOB. Admitted to [facility] for management of NSTEMI, Acute on Chronic HF.  On 5/5 the pt had a decline in mental status,  labs revealing incr trops, markedly elevated BNP, LA > 9, and elevated LFTs   Cardiology is following  PCCM is consulted for ICU transfer in this setting  Pertinent  Medical History  ESRD mvCAD HFrEF  Significant Hospital Events: Including procedures, antibiotic start and stop dates in addition to other pertinent events   5/3 admitted overnight. NSTEMI, medical mgmnt 5/4 Dialysis 5/5 AMS, leukocytosis, LA> 9 incr LFTs trops BNP. PCCM consult ICU transfer   Interim History / Subjective:   Mentation somewhat improved.  Following commands.  Tolerating HD at bedside.  Objective   Blood pressure (!) 140/84, pulse 79, temperature 97.6 F (36.4 C), temperature source Oral, resp. rate (!) 27, height 5\' 10"  (1.778 m), weight 71 kg, SpO2 100 %. CVP:  [6 mmHg-14 mmHg] 14 mmHg      Intake/Output Summary (Last 24 hours) at 01/17/2023 0753 Last data filed at 01/17/2023 0200 Gross per 24 hour  Intake 2262.13 ml  Output --  Net 2262.13 ml   Filed Weights   01/16/23 0328 01/16/23 0500 01/17/23 0600  Weight: 64 kg 64 kg 71 kg    Examination: General: Elderly gentleman comfortable in bed, answers questions appropriately, somewhat somnolent HENT: NCAT, tracking, Lungs: Diminished breath sounds bilateral bases no crackles no wheeze Cardiovascular: Regular rate rhythm, S1-S2 Abdomen: Abdomen soft nontender mildly distended Extremities: No significant edema, lower extremities bilateral cool to touch Neuro: Patient is somnolent, however will follow commands GU: defer  Resolved Hospital Problem list      Assessment & Plan:   Acute encephalopathy, multifactorial etiology, acidosis -concern for sepsis, hypoglycemia P Continue sepsis treatments Continue delirium precautions  Acute on chronic hypoxic respiratory failure Right-sided small pleural effusion, looks like there is loculated fluid in the right oblique fissure on chest x-ray P Continue volume removal and management with HD  Acute on chronic systolic HF NSTEMI Multivessel coronary artery disease P Appreciate cardiac input Heparin drip stopped due to concern for bleed Managed with aspirin plus statin Consideration for cardiac catheterization once stabilized possibly midweek.  Suspected severe sepsis Leukocytosis  CT abdomen pelvis with cholelithiasis, no evidence of acute cholecystitis At risk for bacteremia being on iHD  P Continue broad-spectrum antibiotics CT chest pending for evaluation of loculated fluid in the oblique fissure Right upper quadrant ultrasound pending  Transaminitis RUQ pain  P Right upper quadrant ultrasound pending  Severe lactic acidosis  AGMA  P Recheck lactate, does appear to be coming down  Possible GI bleeding Plan: Holding anticoagulation Was given 1 unit PRBCs Continue PPI  ESRD  P Continue HD per nephrology  Hypoglycemia  Continue to follow CBGs  Hx DVT s/p IVC filter   Nutrition:  Start tube  Cortrack placement   Best Practice (right click and "Reselect all SmartList Selections" daily)   Diet/type: NPO DVT prophylaxis: prophylactic heparin  GI prophylaxis: N/A and PPI Lines: N/A Foley:  N/A Code Status:  full code Last date of multidisciplinary goals of care discussion [--]  Labs   CBC: Recent Labs  Lab 02/01/2023 1841 01/15/23 0835 01/16/23  0345 01/16/23 1636 01/17/23 0423  WBC 15.1* 16.7* 24.9* 24.1* 18.1*  NEUTROABS 12.2*  --   --   --   --   HGB 8.0* 7.8* 8.7* 7.2* 8.6*  HCT 26.2* 25.7* 27.7* 24.4* 26.9*  MCV 99.6 98.5 96.9 103.0* 96.8  PLT  191 218 192 205 196    Basic Metabolic Panel: Recent Labs  Lab 02/03/2023 1841 01/15/23 0835 01/16/23 0345 01/16/23 0928 01/17/23 0423  NA 135 135 133* 136 132*  K 3.9 3.8 4.5 5.1 4.3  CL 90* 90* 92* 91* 92*  CO2 21* 26 21* 16* 22  GLUCOSE 109* 99 109* 144* 89  BUN 16 20 41* 47* 66*  CREATININE 4.63* 4.55* 6.32* 6.95* 7.34*  CALCIUM 8.1* 8.5* 8.3* 8.3* 7.5*  MG  --  2.1  --   --   --   PHOS  --  3.6  --   --   --    GFR: Estimated Creatinine Clearance: 7.9 mL/min (A) (by C-G formula based on SCr of 7.34 mg/dL (H)). Recent Labs  Lab 01/15/23 0835 01/16/23 0345 01/16/23 0928 01/16/23 1224 01/16/23 1355 01/16/23 1636 01/17/23 0423  WBC 16.7* 24.9*  --   --   --  24.1* 18.1*  LATICACIDVEN  --   --  >9.0* >9.0* 4.8*  --   --     Liver Function Tests: Recent Labs  Lab 01/15/23 0835 01/16/23 0928  AST  --  492*  ALT  --  403*  ALKPHOS  --  86  BILITOT  --  1.3*  PROT  --  7.4  ALBUMIN 3.0* 2.8*   Recent Labs  Lab 01/30/2023 1841 01/16/23 1039  LIPASE 32 40   Recent Labs  Lab 01/16/23 1358  AMMONIA 26    ABG    Component Value Date/Time   PHART 7.475 (H) 07/06/2022 1150   PCO2ART 40.6 07/06/2022 1150   PO2ART 131 (H) 07/06/2022 1150   HCO3 28.7 (H) 07/06/2022 1153   TCO2 34 (H) 12/06/2022 1243   O2SAT 78.5 01/16/2023 1405     Coagulation Profile: Recent Labs  Lab 01/16/23 0928  INR 1.9*    Cardiac Enzymes: No results for input(s): "CKTOTAL", "CKMB", "CKMBINDEX", "TROPONINI" in the last 168 hours.  HbA1C: Hgb A1c MFr Bld  Date/Time Value Ref Range Status  05/21/2021 04:27 AM 4.7 (L) 4.8 - 5.6 % Final    Comment:    (NOTE) Pre diabetes:          5.7%-6.4%  Diabetes:              >6.4%  Glycemic control for   <7.0% adults with diabetes   11/06/2015 08:31 AM 4.9 4.8 - 5.6 % Final    Comment:    (NOTE)         Pre-diabetes: 5.7 - 6.4         Diabetes: >6.4         Glycemic control for adults with diabetes: <7.0     CBG: Recent  Labs  Lab 01/16/23 1136 01/16/23 1140 01/16/23 1142 01/16/23 1214 01/16/23 1223  GLUCAP <10* <10* 24* <10* 175*    Review of Systems:   Limited due to AMS + abd pain   Past Medical History:  He,  has a past medical history of Anemia, Arthritis, Chronic combined systolic and diastolic CHF (congestive heart failure) (HCC), DVT (deep venous thrombosis) (HCC), ESRD on hemodialysis (HCC) (02/2015), Essential hypertension, GERD (gastroesophageal reflux disease), History of nuclear stress  test, HTN (hypertension), Hypoalbuminemia, Hypoglycemia, NSTEMI (non-ST elevated myocardial infarction) (HCC) (04/18/2015), OA (osteoarthritis) of knee, Perinephric hematoma (01/2015), 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   Surgical History:   Past Surgical History:  Procedure Laterality Date   AV FISTULA PLACEMENT Right 02/17/2015   Procedure: INSERTION OF RIGHT ARM  ARTERIOVENOUS (AV) GORE-TEX GRAFT ;  Surgeon: Sherren Kerns, MD;  Location: [facility] OR;  Service: Vascular;  Laterality: Right;   FLEXIBLE SIGMOIDOSCOPY N/A 08/04/2015   Procedure: FLEXIBLE SIGMOIDOSCOPY;  Surgeon: Charlott Rakes, MD;  Location: [facility] ENDOSCOPY;  Service: Endoscopy;  Laterality: N/A;   HEMORROIDECTOMY  1999   INSERTION OF DIALYSIS CATHETER N/A 02/17/2015   Procedure: INSERTION OF DIALYSIS CATHETER RIGHT INTERNAL JUGULAR VEIN;  Surgeon: Sherren Kerns, MD;  Location: [facility] OR;  Service: Vascular;  Laterality: N/A;   MULTIPLE TOOTH EXTRACTIONS     PERIPHERAL VASCULAR BALLOON ANGIOPLASTY Right 01/23/2019   Procedure: PERIPHERAL VASCULAR BALLOON ANGIOPLASTY;  Surgeon: Sherren Kerns, MD;  Location: [facility] INVASIVE CV LAB;  Service: Cardiovascular;  Laterality: Right;   REVISION OF ARTERIOVENOUS GORETEX GRAFT Right 03/29/2017   Procedure: REVISION OF ARTERIOVENOUS GORETEX GRAFT;  Surgeon: Fransisco Hertz, MD;  [facility] OR;  Service: Vascular;   Laterality: Right;   RIGHT/LEFT HEART CATH AND CORONARY ANGIOGRAPHY N/A 07/06/2022   Procedure: RIGHT/LEFT HEART CATH AND CORONARY ANGIOGRAPHY;  Surgeon: Marykay Lex, MD;  Location: [facility] INVASIVE CV LAB;  Service: Cardiovascular;  Laterality: N/A;   Surgical procedure to remove a mole as a child Right Eye area   At around 51 years old   TOTAL HIP ARTHROPLASTY Right 12/06/2022   Procedure: TOTAL HIP ARTHROPLASTY ANTERIOR APPROACH;  Surgeon: Samson Frederic, MD;  Location: [facility] OR;  Service: Orthopedics;  Laterality: Right;     Social History:   reports that he has never smoked. He has never used smokeless tobacco. He reports that he does not drink alcohol and does not use drugs.   Family History:  His family history includes Healthy in his father; Hypertension in his brother, mother, and sister.   Allergies No Known Allergies   Home Medications  Prior to Admission medications   Medication Sig Start Date End Date Taking? Authorizing Provider  Accu-Chek Softclix Lancets lancets Use to test blood glucose four times daily as directed. 05/26/21   Leroy Sea, MD  albuterol (VENTOLIN HFA) 108 (90 Base) MCG/ACT inhaler Inhale 2 puffs into the lungs every 6 (six) hours as needed for wheezing or shortness of breath. 08/19/22   [provider]  apixaban (ELIQUIS) 2.5 MG TABS tablet Take 1 tablet (2.5 mg total) by mouth 2 (two) times daily. 12/08/22   Clois Dupes, PA-C  atorvastatin (LIPITOR) 40 MG tablet Take 1 tablet (40 mg total) by mouth daily at 6 PM. Patient not taking: Reported on 12/12/2022 09/21/22   Narda Bonds, MD  Blood Glucose Monitoring Suppl (BLOOD GLUCOSE MONITOR SYSTEM) w/Device KIT Use up to four times daily as directed. 05/26/21   Leroy Sea, MD  Brinzolamide-Brimonidine 1-0.2 % SUSP Place 1 drop into both eyes in the morning and at bedtime. 11/03/21   [provider]  calcitRIOL (ROCALTROL) 0.25 MCG capsule Take 13 capsules (3.25 mcg total) by mouth  every Monday, Wednesday, and Friday with hemodialysis. 12/25/21   Lonia Blood, MD  carvedilol (COREG) 12.5 MG tablet Take 1 tablet (12.5 mg total) by mouth 2 (two) times daily with a meal.  09/28/22   Meriam Sprague, MD  cinacalcet (SENSIPAR) 30 MG tablet Take 3 tablets (90 mg total) by mouth every Monday, Wednesday, and Friday with hemodialysis. 12/25/21   Lonia Blood, MD  ferric gluconate 62.5 mg in sodium chloride 0.9 % 100 mL Inject 62.5 mg into the vein every Wednesday with hemodialysis. 12/30/21   Lonia Blood, MD  gabapentin (NEURONTIN) 300 MG capsule Take 300 mg by mouth daily.    [provider]  glucose blood test strip Use to test blood glucose four times daily as directed 05/26/21   Leroy Sea, MD  HYDROcodone-acetaminophen (NORCO/VICODIN) 5-325 MG tablet Take 1 tablet by mouth every 4 (four) hours as needed for moderate pain or severe pain. 12/07/22 12/07/23  Clois Dupes, PA-C  latanoprost (XALATAN) 0.005 % ophthalmic solution Place 1 drop into both eyes at bedtime. 05/05/21   [provider]  midodrine (PROAMATINE) 10 MG tablet Take 1 tablet (10 mg total) by mouth every Monday, Wednesday, and Friday. Take 10 mg (1 tablet) by mouth before dialysis and 10 mg (1 tablet) at dialysis as directed. 09/29/22   Meriam Sprague, MD  multivitamin (RENA-VIT) TABS tablet Take 1 tablet by mouth at bedtime. 12/08/22 03/08/23  Narda Bonds, MD  nitroGLYCERIN (NITROSTAT) 0.4 MG SL tablet Place 1 tablet (0.4 mg total) under the tongue every 5 (five) minutes as needed for chest pain (for total of 3 doses at the most). 07/08/22   Zannie Cove, MD  omeprazole (PRILOSEC) 40 MG capsule Take 40 mg by mouth daily. Patient not taking: Reported on 12/12/2022 07/31/15   [provider]  polyethylene glycol powder (MIRALAX) 17 GM/SCOOP powder Take 17 g by mouth daily. Hold for diarrhea (watery stools). 12/08/22   Narda Bonds, MD  sevelamer carbonate  (RENVELA) 800 MG tablet Take 2 tablets (1,600 mg total) by mouth 3 (three) times daily with meals. Patient taking differently: Take 1,800 mg by mouth 3 (three) times daily with meals. 05/17/15   Lonia Blood, MD  timolol (TIMOPTIC-XR) 0.5 % ophthalmic gel-forming Place 1 drop into both eyes daily. 11/03/21   [provider]     This patient is critically ill with multiple organ system failure; which, requires frequent high complexity decision making, assessment, support, evaluation, and titration of therapies. This was completed through the application of advanced monitoring technologies and extensive interpretation of multiple databases. During this encounter critical care time was devoted to patient care services described in this note for 32 minutes.   Josephine Igo, DO [facility] 01/17/2023 7:53 AM     ,

## 2023-01-17 NOTE — TOC Initial Note (Deleted)
Transition of Care [facility]) - Initial/Assessment Note    Patient Details  Name: Travis [name] MRN: [id number] Date of Birth: 1941-04-03  Transition of Care [facility]) CM/SW Contact:    Elliot Cousin, RN Phone Number: [phone] 01/17/2023, 4:53 PM  Clinical Narrative:     Barriers to Discharge: Continued Medical Work up   Patient Goals and CMS Choice Patient states their goals for this hospitalization and ongoing recovery are:: HCPOA wants patient to be comfortable          Expected Discharge Plan and Services   Discharge Planning Services: CM Consult                                          Prior Living Arrangements/Services   Lives with:: Significant Other                   Activities of Daily Living Home Assistive Devices/Equipment: Environmental consultant (specify type), Shower chair with back ADL Screening (condition at time of admission) Patient's cognitive ability adequate to safely complete daily activities?: Yes Is the patient deaf or have difficulty hearing?: No Does the patient have difficulty seeing, even when wearing glasses/contacts?: Yes Does the patient have difficulty concentrating, remembering, or making decisions?: Yes Patient able to express need for assistance with ADLs?: Yes Does the patient have difficulty dressing or bathing?: Yes Independently performs ADLs?: Yes (appropriate for developmental age) Communication: Independent Dressing (OT): Needs assistance Is this a change from baseline?: Pre-admission baseline Grooming: Needs assistance Is this a change from baseline?: Pre-admission baseline Feeding: Independent Bathing: Needs assistance Is this a change from baseline?: Pre-admission baseline Toileting: Needs assistance Is this a change from baseline?: Pre-admission baseline In/Out Bed: Needs assistance Is this a change from baseline?: Pre-admission baseline Walks in Home: Needs assistance Is this a change from baseline?:  Pre-admission baseline Does the patient have difficulty walking or climbing stairs?: Yes Weakness of Legs: Both Weakness of Arms/Hands: None  Permission Sought/Granted                  Emotional Assessment   Attitude/Demeanor/Rapport: Intubated (Following Commands or Not Following Commands)       Psych Involvement: No (comment)  Admission diagnosis:  NSTEMI (non-ST elevated myocardial infarction) (HCC) [I21.4] Volume overload [E87.70] Acute on chronic congestive heart failure, unspecified heart failure type (HCC) [I50.9] Patient Active Problem List   Diagnosis Date Noted   Shock (HCC) 01/16/2023   Transaminitis 01/16/2023   Lactic acidosis 01/16/2023   Severe sepsis (HCC) 01/16/2023   Encephalopathy acute 01/16/2023   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   Closed right hip fracture, initial encounter (HCC) 12/05/2022   Glaucoma 09/19/2022   Fluid overload 09/19/2022   Influenza A 08/03/2022   Hyperkalemia 08/02/2022   CHF (congestive heart failure) (HCC) 08/02/2022   Coronary artery disease 07/06/2022   Elevated troponin 07/05/2022   Prolonged QT interval 07/05/2022   GERD (gastroesophageal reflux disease) 07/05/2022   NSTEMI (non-ST elevated myocardial infarction) (HCC) 07/04/2022   Delirium 12/22/2021   Acute metabolic encephalopathy 12/20/2021   End stage renal disease on dialysis [facility])    Hypertensive emergency 12/18/2021   Hypervolemia associated with renal insufficiency 04/04/2019   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   Chronic combined systolic (congestive) and diastolic (congestive) heart failure (HCC) 11/05/2015   Chest pain    Thrombocytopenia (HCC) 08/11/2015   FTT (failure to thrive) in  adult 08/11/2015   HTN (hypertension) 04/19/2015   Acute on chronic combined systolic and diastolic CHF (congestive heart failure) (HCC) 04/18/2015   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   ESRD on dialysis [facility])    Palliative care encounter     Acute DVT of right tibial vein (HCC) 01/29/2015   Physical deconditioning 01/28/2015   Sjogren's disease (HCC) 01/28/2015   Normocytic anemia 12/17/2014   PCP:  Cl[facility], [facility]:   [id number] [place], [place] - [address] AT [facility] [address] [place] [address] Phone: [phone] Fax: [phone]  [facility] - [place], [place] - [address] [place] [address] Phone: [phone] Fax: [phone]  [facility] [address] [place] [address] Phone: [phone] Fax: [phone]     Social Determinants of Health (SDOH) Social History: SDOH Screenings   Food Insecurity: No Food Insecurity (01/19/2023)  Housing: Low Risk  (01/28/2023)  Transportation Needs: No Transportation Needs (01/18/2023)  Utilities: Not At Risk (01/22/2023)  Tobacco Use: Low Risk  (01/16/2023)   SDOH Interventions:     Readmission Risk Interventions    07/05/2022   12:00 PM 12/22/2021    5:33 PM 05/26/2021   12:03 PM  Readmission Risk Prevention Plan  Transportation Screening Complete Complete Complete  PCP or Specialist Appt within 3-5 Days  Complete Complete  HRI or Home Care Consult Complete Complete Complete  Social Work Consult for Recovery Care Planning/Counseling Complete Complete Complete  Palliative Care Screening Not Applicable Not Applicable Not Applicable  Medication Review Oceanographer) Referral to Pharmacy Complete Complete

## 2023-01-17 NOTE — Progress Notes (Signed)
Nutrition Follow-up  DOCUMENTATION CODES:   Non-severe (moderate) malnutrition in context of chronic illness  INTERVENTION:   Tube Feeding via Cortrak:  Vital 1.5 at 45 ml/hr Begin TF at rate of 20 ml/hr; titrate by 10 mL q 8 hours until goal rate of 50 ml/hr Pro-Source TF20 60 mL daily TF regimen at goal provides 1700 kcals, 93 g of protein and 821 mL of free water  Renal MVI daily  NUTRITION DIAGNOSIS:   Moderate Malnutrition related to chronic illness as evidenced by moderate fat depletion, severe muscle depletion, moderate muscle depletion.  Being addressed via TF   GOAL:   Patient will meet greater than or equal to 90% of their needs  Progressing  MONITOR:   Diet advancement, TF tolerance, Labs, Weight trends  REASON FOR ASSESSMENT:   Consult Assessment of nutrition requirement/status  ASSESSMENT:   82 year-old male with medical history of ESRD on HD, HFrEF, HTN, CAD, GERD, arthritis, Sjogren's disease, pulmonary fibrosis, anemia, protein calorie malnutrition, NSTEMI. He presented to the ED on 5/3 due to shortness of breath and was admitted for management of NSTEMI and acute on chronic HF. On 5/5 he was noted to have change in mental status and labs indicated increased troponin, markedly elevated BNP, lactic acid > 9, and elevated LFTs. Cardiology following. Patient was transferred to the ICU on 5/5.  5/03 Admitted, NSTEMI 5/04 iHD 5/05 AMS, tx to ICU, CT C/A/P with bilateral loculated pleural effusions and associated pleural thickening. Marked cardiomegaly. Cholelithiasis without CT evidence of acute cholecystitis. Trace upper abdominal ascites. Colonic diverticulosis without diverticulitis. 5/06 Cortrak placed, TF initiated  Noted US liver/GB pending, Receiving iHD this AM  Pt lethargic but arousable. Pt following commands but did not verbalize much, unable to get full diet and weight history at this time  Outpatient iHD EDW: 67.4 kg Pt below iHD on last  outpatient treatment on 5/03: 66.7 kg  Noted hypoglycemia, should resovle with initiation of continuous TF  Labs: sodium 132 (L) Meds: sensipar, calcitriol, rena-vite, renvela   NUTRITION - FOCUSED PHYSICAL EXAM:  Flowsheet Row Most Recent Value  Orbital Region Moderate depletion  Upper Arm Region Moderate depletion  Thoracic and Lumbar Region Moderate depletion  Buccal Region Moderate depletion  Temple Region Severe depletion  Clavicle Bone Region Severe depletion  Clavicle and Acromion Bone Region Severe depletion  Scapular Bone Region Moderate depletion  Dorsal Hand Moderate depletion  Patellar Region Severe depletion  Anterior Thigh Region Severe depletion  Posterior Calf Region Severe depletion  Edema (RD Assessment) Mild       Diet Order:   Diet Order             Diet NPO time specified Except for: Sips with Meds  Diet effective midnight                   EDUCATION NEEDS:   Not appropriate for education at this time  Skin:  Skin Assessment: Reviewed RN Assessment  Last BM:  5/5 large type 7  Height:   Ht Readings from Last 1 Encounters:  01/14/23 5\' 10"  (1.778 m)    Weight:   Wt Readings from Last 1 Encounters:  01/17/23 69.7 kg    Ideal Body Weight:  75.4 kg  BMI:  Body mass index is 22.05 kg/m.  Estimated Nutritional Needs:   Kcal:  1700-1900 kcals  Protein:  85-100 g  Fluid:  UOP + 1 L/day  Romelle Starcher MS, RDN, LDN, CNSC Registered Dietitian 3 Clinical  Nutrition RD Pager and On-Call Pager Number Located in [place]

## 2023-01-17 NOTE — Progress Notes (Signed)
Rounding Note    Patient Name: Travis [name] Date of Encounter: 01/17/2023  [facility] Cardiologist: Meriam Sprague, MD   Subjective   Somnolent on exam but denies chest pain or SOB  Transferred to the ICU yesterday for rising lactate >9 Co-ox 78, CVP 6 at time of transfer consistent with septic picture Received 1u pRBC for dropping hemoglobin; heparin stopped Was placed on broad spectrum ABX  Inpatient Medications    Scheduled Meds:  sodium chloride   Intravenous Once   aspirin EC  81 mg Oral Daily   atorvastatin  40 mg Oral Daily   calcitRIOL  3.25 mcg Oral Q M,W,F-HD   Chlorhexidine Gluconate Cloth  6 each Topical Q0600   cinacalcet  90 mg Oral Q M,W,F-HD   midodrine  10 mg Oral Q M,W,F   multivitamin  1 tablet Oral QHS   pantoprazole (PROTONIX) IV  40 mg Intravenous Q12H   sevelamer carbonate  1,600 mg Oral TID WC   sodium chloride flush  3 mL Intravenous Q12H   Continuous Infusions:  sodium chloride     sodium chloride 10 mL/hr at 01/17/23 0200   albumin human     ceFEPime (MAXIPIME) IV Stopped (01/16/23 1458)   metronidazole 500 mg (01/17/23 0422)   vancomycin     PRN Meds: sodium chloride, albumin human, sodium chloride flush   Vital Signs    Vitals:   01/17/23 0300 01/17/23 0400 01/17/23 0500 01/17/23 0600  BP: 120/71 132/77 128/81 (!) 140/84  Pulse: 79 78 79 79  Resp: (!) 24 (!) 22 (!) 23 (!) 27  Temp: 97.7 F (36.5 C)     TempSrc: Oral     SpO2: 100% 100% 100% 100%  Weight:    71 kg  Height:        Intake/Output Summary (Last 24 hours) at 01/17/2023 0640 Last data filed at 01/17/2023 0200 Gross per 24 hour  Intake 2262.13 ml  Output --  Net 2262.13 ml       01/17/2023    6:00 AM 01/16/2023    5:00 AM 01/16/2023    3:28 AM  Last 3 Weights  Weight (lbs) 156 lb 8.4 oz 141 lb 141 lb  Weight (kg) 71 kg 63.957 kg 63.957 kg      Telemetry    NSR - Personally Reviewed  ECG    No new tracing today- Personally  Reviewed  Physical Exam   GEN: Somnolent but arousable to voice.  Neck: CVL in place Cardiac: RRR, 2/6 systolic murmur Respiratory: Diminished at bases GI: Soft, nontender, non-distended  MS: No edema; No deformity. Neuro:  Somnolent but following commands and answering questions appropriately Psych: Calm  Labs    High Sensitivity Troponin:   Recent Labs  Lab 01/31/2023 2052 01/16/2023 2330 01/15/23 0101 01/15/23 0835 01/16/23 0928  TROPONINIHS 1,597* 1,697* 1,709* 2,373* 2,679*      Chemistry Recent Labs  Lab 01/15/23 0835 01/16/23 0345 01/16/23 0928 01/17/23 0423  NA 135 133* 136 132*  K 3.8 4.5 5.1 4.3  CL 90* 92* 91* 92*  CO2 26 21* 16* 22  GLUCOSE 99 109* 144* 89  BUN 20 41* 47* 66*  CREATININE 4.55* 6.32* 6.95* 7.34*  CALCIUM 8.5* 8.3* 8.3* 7.5*  MG 2.1  --   --   --   PROT  --   --  7.4  --   ALBUMIN 3.0*  --  2.8*  --   AST  --   --  492*  --   ALT  --   --  403*  --   ALKPHOS  --   --  86  --   BILITOT  --   --  1.3*  --   GFRNONAA 12* 8* 7* 7*  ANIONGAP 19* 20* 29* 18*     Lipids  Recent Labs  Lab 01/15/23 0835  CHOL 119  TRIG 77  HDL 43  LDLCALC 61  CHOLHDL 2.8     Hematology Recent Labs  Lab 01/16/23 0345 01/16/23 1636 01/17/23 0423  WBC 24.9* 24.1* 18.1*  RBC 2.86* 2.37* 2.78*  HGB 8.7* 7.2* 8.6*  HCT 27.7* 24.4* 26.9*  MCV 96.9 103.0* 96.8  MCH 30.4 30.4 30.9  MCHC 31.4 29.5* 32.0  RDW 19.2* 19.6* 18.8*  PLT 192 205 196    Thyroid No results for input(s): "TSH", "FREET4" in the last 168 hours.  BNP Recent Labs  Lab 01/27/2023 1841  BNP >4,500.0*     DDimer No results for input(s): "DDIMER" in the last 168 hours.   Radiology    CT ABDOMEN PELVIS W CONTRAST  Result Date: 01/16/2023 CLINICAL DATA:  Sepsis EXAM: CT ABDOMEN AND PELVIS WITH CONTRAST TECHNIQUE: Multidetector CT imaging of the abdomen and pelvis was performed using the standard protocol following bolus administration of intravenous contrast. RADIATION DOSE  REDUCTION: This exam was performed according to the departmental dose-optimization program which includes automated exposure control, adjustment of the mA and/or kV according to patient size and/or use of iterative reconstruction technique. CONTRAST:  75mL OMNIPAQUE IOHEXOL 350 MG/ML SOLN COMPARISON:  05/23/2021, 01/16/2023 FINDINGS: Lower chest: The heart is enlarged without pericardial effusion. There are small loculated bilateral pleural effusions and pleural thickening. Bibasilar bronchial wall thickening. Hepatobiliary: No focal liver abnormality. Calcified gallstones are identified without gallbladder wall thickening. No biliary duct dilation. Pancreas: Unremarkable. No pancreatic ductal dilatation or surrounding inflammatory changes. Spleen: Normal in size without focal abnormality. Adrenals/Urinary Tract: Stable cystic changes in bilateral renal cortical thinning. No specific imaging follow-up is recommended. No urinary tract calculi or obstructive uropathy. Bladder is decompressed, limiting its evaluation. The adrenals are stable. Stomach/Bowel: No bowel obstruction or ileus. Normal appendix right lower quadrant. Scattered colonic diverticulosis without evidence of acute diverticulitis. No bowel wall thickening or inflammatory change. Vascular/Lymphatic: Aortic atherosclerosis. Stable IVC filter. No enlarged abdominal or pelvic lymph nodes. Reproductive: Prostate is enlarged. Evaluation limited due to streak artifact from right hip arthroplasty. Other: Trace free fluid within the upper abdomen. No free intraperitoneal gas. No abdominal wall hernia. Musculoskeletal: Right hip arthroplasty. No acute or destructive bony abnormalities. IMPRESSION: 1. Bilateral loculated pleural effusions and associated pleural thickening. 2. Marked cardiomegaly. 3. Cholelithiasis without CT evidence of acute cholecystitis. 4. Trace upper abdominal ascites. 5. Colonic diverticulosis without diverticulitis. 6. Stable enlarged  prostate. 7. Stable bilateral renal cortical thinning and cystic change, consistent with chronic renal insufficiency. 8.  Aortic Atherosclerosis (ICD10-I70.0). Electronically Signed   By: Sharlet Salina M.D.   On: 01/16/2023 18:28   DG CHEST PORT 1 VIEW  Result Date: 01/16/2023 CLINICAL DATA:  Leukocytosis. EXAM: PORTABLE CHEST 1 VIEW COMPARISON:  01/19/2023 and older studies. FINDINGS: Cardiac silhouette mildly enlarged.  No mediastinal hilar masses. Lungs hyperexpanded. Interstitial thickening is noted in the mid to lower lungs. Oval opacity in the right lower lung is consistent with loculated fluid along the right oblique fissure, present on the CT from 09/19/2022. Small pleural effusions suspected. Intact. No pneumothorax. Stable right subclavian region stent. Skeletal structures are grossly  IMPRESSION: 1. No change from the most recent prior exam. 2. Interstitial thickening with small effusions suggest mild congestive heart failure. Stable loculated pleural fluid in the right oblique fissure. 3. Electronically Signed   By: Amie Portland M.D.   On: 01/16/2023 14:11   DG CHEST PORT 1 VIEW  Result Date: 01/16/2023 CLINICAL DATA:  Central line placement. EXAM: PORTABLE CHEST 1 VIEW COMPARISON:  01/16/2023 at 9:56 a.m. and older exams. FINDINGS: New left internal jugular central venous catheter has its tip in the mid superior vena cava. No pneumothorax. No other change from the exam obtained earlier today. IMPRESSION: 1. Left internal jugular central venous catheter tip projects in the mid superior vena cava. No pneumothorax. Electronically Signed   By: Amie Portland M.D.   On: 01/16/2023 14:08   ECHOCARDIOGRAM COMPLETE  Result Date: 01/15/2023    ECHOCARDIOGRAM REPORT   Patient Name:   Travis [name] Date of Exam: 01/15/2023 Medical Rec #:  [id number]       Height:       70.0 in Accession #:    [id number]      Weight:       165.0 lb Date of Birth:  12-18-1940       BSA:          1.923 m Patient Age:    82  years        BP:           125/83 mmHg Patient Gender: M               HR:           109 bpm. Exam Location:  Inpatient Procedure: 2D Echo, Color Doppler, Cardiac Doppler and Intracardiac            Opacification Agent Indications:    I50.21 Acute systolic (congestive) heart failure  History:        Patient has prior history of Echocardiogram examinations, most                 recent 07/06/2022. CHF, CAD; Risk Factors:Hypertension.  Sonographer:    Irving Burton Senior RDCS Referring Phys: Darlin Drop  Sonographer Comments: Apical window foreshortened due to thin body habitus. IMPRESSIONS  1. Left ventricular ejection fraction, by estimation, is 25 to 30%. The left ventricle has severely decreased function. The left ventricle demonstrates regional wall motion abnormalities (see scoring diagram/findings for description). The left ventricular internal cavity size was moderately to severely dilated. There is moderate asymmetric left ventricular hypertrophy of the infero-lateral segment. Indeterminate diastolic filling due to E-A fusion.  2. Right ventricular systolic function is low normal. The right ventricular size is mildly enlarged. There is moderately elevated pulmonary artery systolic pressure. The estimated right ventricular systolic pressure is 55.2 mmHg.  3. Left atrial size was severely dilated.  4. Right atrial size was severely dilated.  5. The mitral valve is degenerative. Moderate mitral valve regurgitation.  6. Tricuspid valve regurgitation is moderate to severe.  7. The aortic valve is tricuspid. There is moderate calcification of the aortic valve. There is moderate thickening of the aortic valve. Aortic valve regurgitation is mild to moderate. Likely moderate vs mild to moderate low flow, low gradient AS with AVA 1.1cm2, mean gradient 14, Vmax 2.33, DI 0.37. SVi low at 20.Marland Kitchen Aortic valve mean gradient measures 14.0 mmHg. Aortic valve Vmax measures 2.33 m/s.  8. The inferior vena cava is dilated in size with  <50% respiratory variability, suggesting right atrial pressure of  15 mmHg. Comparison(s): Compared to prior TTE on 06/2022, the LVEF has dropped from 45-50% to 25-30% with WMA as described. The MR and TR are worse in the setting of elevated filling pressures. FINDINGS  Left Ventricle: Left ventricular ejection fraction, by estimation, is 25 to 30%. The left ventricle has severely decreased function. The left ventricle demonstrates regional wall motion abnormalities. Definity contrast agent was given IV to delineate the left ventricular endocardial borders. The left ventricular internal cavity size was moderately to severely dilated. There is moderate asymmetric left ventricular hypertrophy of the infero-lateral segment. Indeterminate diastolic filling due to E-A fusion.  LV Wall Scoring: The entire anterior wall, mid and distal lateral wall, entire septum, entire inferior wall, and mid anterolateral segment are hypokinetic. The basal inferolateral segment, basal anterolateral segment, and apex are normal. Right Ventricle: The right ventricular size is mildly enlarged. No increase in right ventricular wall thickness. Right ventricular systolic function is low normal. There is moderately elevated pulmonary artery systolic pressure. The tricuspid regurgitant  velocity is 3.17 m/s, and with an assumed right atrial pressure of 15 mmHg, the estimated right ventricular systolic pressure is 55.2 mmHg. Left Atrium: Left atrial size was severely dilated. Right Atrium: Right atrial size was severely dilated. Pericardium: There is no evidence of pericardial effusion. Mitral Valve: The mitral valve is degenerative in appearance. There is mild thickening of the mitral valve leaflet(s). There is mild calcification of the mitral valve leaflet(s). Moderate mitral valve regurgitation. Tricuspid Valve: The tricuspid valve is normal in structure. Tricuspid valve regurgitation is moderate to severe. The flow in the hepatic veins is  blunted (decreased) during ventricular systole. Aortic Valve: The aortic valve is tricuspid. There is moderate calcification of the aortic valve. There is moderate thickening of the aortic valve. Aortic valve regurgitation is mild to moderate. Aortic regurgitation PHT measures 262 msec. Likely moderate vs mild to moderate low flow, low gradient AS with AVA 1.1cm2, mean gradient 14, Vmax 2.33, DI 0.37. SVi low at 20. Aortic valve mean gradient measures 14.0 mmHg. Aortic valve peak gradient measures 21.7 mmHg. Aortic valve area, by VTI measures 1.08 cm. Pulmonic Valve: The pulmonic valve was grossly normal. Pulmonic valve regurgitation is mild. Aorta: The aortic root and ascending aorta are structurally normal, with no evidence of dilitation. Venous: A systolic blunting flow pattern is recorded from the left upper pulmonary vein and the right lower pulmonary vein. The inferior vena cava is dilated in size with less than 50% respiratory variability, suggesting right atrial pressure of 15 mmHg. IAS/Shunts: No atrial level shunt detected by color flow Doppler.  LEFT VENTRICLE PLAX 2D LVIDd:         5.10 cm      Diastology LVIDs:         4.30 cm      LV e' medial:    7.40 cm/s LV PW:         1.40 cm      LV E/e' medial:  16.1 LV IVS:        0.80 cm      LV e' lateral:   11.00 cm/s LVOT diam:     2.00 cm      LV E/e' lateral: 10.8 LV SV:         38 LV SV Index:   20 LVOT Area:     3.14 cm  LV Volumes (MOD) LV vol d, MOD A2C: 196.0 ml LV vol d, MOD A4C: 135.0 ml LV vol s, MOD A2C:  139.0 ml LV vol s, MOD A4C: 85.5 ml LV SV MOD A2C:     57.0 ml LV SV MOD A4C:     135.0 ml LV SV MOD BP:      59.3 ml RIGHT VENTRICLE RV S prime:     9.57 cm/s TAPSE (M-mode): 1.6 cm LEFT ATRIUM              Index        RIGHT ATRIUM           Index LA diam:        3.40 cm  1.77 cm/m   RA Area:     23.80 cm LA Vol (A2C):   110.0 ml 57.19 ml/m  RA Volume:   84.40 ml  43.88 ml/m LA Vol (A4C):   82.6 ml  42.94 ml/m LA Biplane Vol: 96.1 ml   49.96 ml/m  AORTIC VALVE AV Area (Vmax):    1.16 cm AV Area (Vmean):   1.08 cm AV Area (VTI):     1.08 cm AV Vmax:           233.00 cm/s AV Vmean:          178.000 cm/s AV VTI:            0.352 m AV Peak Grad:      21.7 mmHg AV Mean Grad:      14.0 mmHg LVOT Vmax:         85.90 cm/s LVOT Vmean:        61.300 cm/s LVOT VTI:          0.121 m LVOT/AV VTI ratio: 0.34 AI PHT:            262 msec  AORTA Ao Root diam: 3.20 cm Ao Asc diam:  3.60 cm MITRAL VALVE                  TRICUSPID VALVE MV Area (PHT): 4.99 cm       TR Peak grad:   40.2 mmHg MV Decel Time: 152 msec       TR Vmax:        317.00 cm/s MR Peak grad:    82.1 mmHg MR Mean grad:    55.0 mmHg    SHUNTS MR Vmax:         453.00 cm/s  Systemic VTI:  0.12 m MR Vmean:        354.0 cm/s   Systemic Diam: 2.00 cm MR PISA:         1.01 cm MR PISA Eff ROA: 9 mm MR PISA Radius:  0.40 cm MV E velocity: 119.00 cm/s Laurance Flatten MD Electronically signed by Laurance Flatten MD Signature Date/Time: 01/15/2023/11:16:53 AM    Final     Cardiac Studies   Cardiac Studies & Procedures   CARDIAC CATHETERIZATION  CARDIAC CATHETERIZATION 07/06/2022  Narrative   Heavily calcified diffusely diseased vessel: 1st Diag-1 lesion is 99% stenosed. 1st Diag-2 lesion is 60% stenosed. 1st Diag-3 lesion is 95% stenosed.   The left ventricular systolic function is normal. The left ventricular ejection fraction is 50-55% by visual estimate.   LV end diastolic pressure is moderately elevated.   Hemodynamic findings consistent with mild pulmonary hypertension.  POSTOP DIAGNOSES Moderate-severe 2-vessel single-vessel disease: Proximal D1-99%, mid 60%, distal 95% (heavily calcified/diffusely diseased; not favorable for PCI would require atherectomy and a relatively small caliber vessel) & 60% ostial sidebranch of OM1 followed by a 60% mid vessel then distal occlusion (  with right to left collaterals). Otherwise mild diffuse disease/calcified vessels Mostly preserved  LVEF, cannot exclude anterolateral hypokinesis. LVEDP moderately elevated at 19 mmHg (LVEDP 166/5 mmHg) with a PCWP of ~22 mmHg. RHC numbers: Mean RAP 6 mmHg, RVP-EDP 51/0-5 mmHg; PAP-mean 50/13-27 mmHg; PCWP 17-26 mmHg (~22 mmHg); Ao sat 99%, PA sat 74%. CO-CI: Fick -> 8.22, 4.44; thermal 5.37-2.7 (suspect that the discrepancy is related to AV fistula)   RECOMMENDATIONS Would recommend medical management for heavily diseased D1 branch. This is heavily calcified vessel with diffuse disease, will require atherectomy and extensive stent placement in the vessel is roughly 2 to 2.25 mm distally.  Not favorable for PCI. Consider slightly increased volume removal and HD based on elevated filling pressures. Return to nursing for ongoing care    Bryan Lemma, MD  Findings Coronary Findings Diagnostic  Dominance: Co-dominant  Left Anterior Descending Vessel is normal in caliber. There is mild diffuse disease throughout the vessel. The vessel is mildly calcified.  First Diagonal Branch Vessel is small in size. There is moderate  diffuse disease in the vessel. Diffuse calcification 1st Diag-1 lesion is 99% stenosed. Vessel is the culprit lesion. The lesion is eccentric. The lesion is severely calcified. 1st Diag-2 lesion is 60% stenosed. The lesion is concentric. The lesion is moderately calcified. 1st Diag-3 lesion is 95% stenosed. The lesion is segmental and concentric. The lesion is moderately calcified.  Lateral First Diagonal Branch Vessel is small in size.  Second Diagonal Branch Vessel is small in size.  Left Circumflex Vessel is normal in caliber and large. The vessel exhibits minimal luminal irregularities.  First Obtuse Marginal Branch Vessel is large in size. The vessel exhibits minimal luminal irregularities.  Lateral First Obtuse Marginal Branch Vessel is small in size.  Second Obtuse Marginal Branch Vessel is small in size.  First Left Posterolateral  Branch Vessel is small in size.  Second Left Posterolateral Branch Vessel is small in size.  Third Left Posterolateral Branch Vessel is small in size.  Left Posterior Atrioventricular Artery Vessel is large in size.  Right Coronary Artery Vessel was injected. Vessel is small. There is mild diffuse disease throughout the vessel. The vessel is mildly calcified.  Acute Marginal Branch Vessel is small in size.  Right Ventricular Branch  Right Posterior Descending Artery Vessel is small in size.  First Right Posterolateral Branch Vessel is moderate in size.  Intervention  No interventions have been documented.   STRESS TESTS  MYOCARDIAL PERFUSION IMAGING 01/12/2021  ECHOCARDIOGRAM  ECHOCARDIOGRAM COMPLETE 01/15/2023  Narrative ECHOCARDIOGRAM REPORT    Patient Name:   Travis [name] Date of Exam: 01/15/2023 Medical Rec #:  [id number]       Height:       70.0 in Accession #:    [id number]      Weight:       165.0 lb Date of Birth:  05/10/1941       BSA:          1.923 m Patient Age:    82 years        BP:           125/83 mmHg Patient Gender: M               HR:           109 bpm. Exam Location:  Inpatient  Procedure: 2D Echo, Color Doppler, Cardiac Doppler and Intracardiac Opacification Agent  Indications:    I50.21 Acute systolic (congestive) heart failure  History:  Patient has prior history of Echocardiogram examinations, most recent 07/06/2022. CHF, CAD; Risk Factors:Hypertension.  Sonographer:    Irving Burton Senior RDCS Referring Phys: Darlin Drop   Sonographer Comments: Apical window foreshortened due to thin body habitus. IMPRESSIONS   1. Left ventricular ejection fraction, by estimation, is 25 to 30%. The left ventricle has severely decreased function. The left ventricle demonstrates regional wall motion abnormalities (see scoring diagram/findings for description). The left ventricular internal cavity size was moderately to severely dilated. There is  moderate asymmetric left ventricular hypertrophy of the infero-lateral segment. Indeterminate diastolic filling due to E-A fusion. 2. Right ventricular systolic function is low normal. The right ventricular size is mildly enlarged. There is moderately elevated pulmonary artery systolic pressure. The estimated right ventricular systolic pressure is 55.2 mmHg. 3. Left atrial size was severely dilated. 4. Right atrial size was severely dilated. 5. The mitral valve is degenerative. Moderate mitral valve regurgitation. 6. Tricuspid valve regurgitation is moderate to severe. 7. The aortic valve is tricuspid. There is moderate calcification of the aortic valve. There is moderate thickening of the aortic valve. Aortic valve regurgitation is mild to moderate. Likely moderate vs mild to moderate low flow, low gradient AS with AVA 1.1cm2, mean gradient 14, Vmax 2.33, DI 0.37. SVi low at 20.Marland Kitchen Aortic valve mean gradient measures 14.0 mmHg. Aortic valve Vmax measures 2.33 m/s. 8. The inferior vena cava is dilated in size with <50% respiratory variability, suggesting right atrial pressure of 15 mmHg.  Comparison(s): Compared to prior TTE on 06/2022, the LVEF has dropped from 45-50% to 25-30% with WMA as described. The MR and TR are worse in the setting of elevated filling pressures.  FINDINGS Left Ventricle: Left ventricular ejection fraction, by estimation, is 25 to 30%. The left ventricle has severely decreased function. The left ventricle demonstrates regional wall motion abnormalities. Definity contrast agent was given IV to delineate the left ventricular endocardial borders. The left ventricular internal cavity size was moderately to severely dilated. There is moderate asymmetric left ventricular hypertrophy of the infero-lateral segment. Indeterminate diastolic filling due to E-A fusion.   LV Wall Scoring: The entire anterior wall, mid and distal lateral wall, entire septum, entire inferior wall, and mid  anterolateral segment are hypokinetic. The basal inferolateral segment, basal anterolateral segment, and apex are normal.  Right Ventricle: The right ventricular size is mildly enlarged. No increase in right ventricular wall thickness. Right ventricular systolic function is low normal. There is moderately elevated pulmonary artery systolic pressure. The tricuspid regurgitant velocity is 3.17 m/s, and with an assumed right atrial pressure of 15 mmHg, the estimated right ventricular systolic pressure is 55.2 mmHg.  Left Atrium: Left atrial size was severely dilated.  Right Atrium: Right atrial size was severely dilated.  Pericardium: There is no evidence of pericardial effusion.  Mitral Valve: The mitral valve is degenerative in appearance. There is mild thickening of the mitral valve leaflet(s). There is mild calcification of the mitral valve leaflet(s). Moderate mitral valve regurgitation.  Tricuspid Valve: The tricuspid valve is normal in structure. Tricuspid valve regurgitation is moderate to severe. The flow in the hepatic veins is blunted (decreased) during ventricular systole.  Aortic Valve: The aortic valve is tricuspid. There is moderate calcification of the aortic valve. There is moderate thickening of the aortic valve. Aortic valve regurgitation is mild to moderate. Aortic regurgitation PHT measures 262 msec. Likely moderate vs mild to moderate low flow, low gradient AS with AVA 1.1cm2, mean gradient 14, Vmax 2.33, DI 0.37. SVi  low at 20. Aortic valve mean gradient measures 14.0 mmHg. Aortic valve peak gradient measures 21.7 mmHg. Aortic valve area, by VTI measures 1.08 cm.  Pulmonic Valve: The pulmonic valve was grossly normal. Pulmonic valve regurgitation is mild.  Aorta: The aortic root and ascending aorta are structurally normal, with no evidence of dilitation.  Venous: A systolic blunting flow pattern is recorded from the left upper pulmonary vein and the right lower pulmonary  vein. The inferior vena cava is dilated in size with less than 50% respiratory variability, suggesting right atrial pressure of 15 mmHg.  IAS/Shunts: No atrial level shunt detected by color flow Doppler.   LEFT VENTRICLE PLAX 2D LVIDd:         5.10 cm      Diastology LVIDs:         4.30 cm      LV e' medial:    7.40 cm/s LV PW:         1.40 cm      LV E/e' medial:  16.1 LV IVS:        0.80 cm      LV e' lateral:   11.00 cm/s LVOT diam:     2.00 cm      LV E/e' lateral: 10.8 LV SV:         38 LV SV Index:   20 LVOT Area:     3.14 cm  LV Volumes (MOD) LV vol d, MOD A2C: 196.0 ml LV vol d, MOD A4C: 135.0 ml LV vol s, MOD A2C: 139.0 ml LV vol s, MOD A4C: 85.5 ml LV SV MOD A2C:     57.0 ml LV SV MOD A4C:     135.0 ml LV SV MOD BP:      59.3 ml  RIGHT VENTRICLE RV S prime:     9.57 cm/s TAPSE (M-mode): 1.6 cm  LEFT ATRIUM              Index        RIGHT ATRIUM           Index LA diam:        3.40 cm  1.77 cm/m   RA Area:     23.80 cm LA Vol (A2C):   110.0 ml 57.19 ml/m  RA Volume:   84.40 ml  43.88 ml/m LA Vol (A4C):   82.6 ml  42.94 ml/m LA Biplane Vol: 96.1 ml  49.96 ml/m AORTIC VALVE AV Area (Vmax):    1.16 cm AV Area (Vmean):   1.08 cm AV Area (VTI):     1.08 cm AV Vmax:           233.00 cm/s AV Vmean:          178.000 cm/s AV VTI:            0.352 m AV Peak Grad:      21.7 mmHg AV Mean Grad:      14.0 mmHg LVOT Vmax:         85.90 cm/s LVOT Vmean:        61.300 cm/s LVOT VTI:          0.121 m LVOT/AV VTI ratio: 0.34 AI PHT:            262 msec  AORTA Ao Root diam: 3.20 cm Ao Asc diam:  3.60 cm  MITRAL VALVE                  TRICUSPID VALVE MV Area (  PHT): 4.99 cm       TR Peak grad:   40.2 mmHg MV Decel Time: 152 msec       TR Vmax:        317.00 cm/s MR Peak grad:    82.1 mmHg MR Mean grad:    55.0 mmHg    SHUNTS MR Vmax:         453.00 cm/s  Systemic VTI:  0.12 m MR Vmean:        354.0 cm/s   Systemic Diam: 2.00 cm MR PISA:         1.01 cm MR  PISA Eff ROA: 9 mm MR PISA Radius:  0.40 cm MV E velocity: 119.00 cm/s  Laurance Flatten MD Electronically signed by Laurance Flatten MD Signature Date/Time: 01/15/2023/11:16:53 AM    Final              Patient Profile     82 y.o. 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, multivessel CAD, and history of DVT who presented to the ER with chest pain and SOB found to have elevated trop  for which Cardiology was consulted  Assessment & Plan    #Lactic acidosis: #Suspected Septic Shock: -Patient with lactate >9 on 01/16/23 with somnolence on exam prompting transfer to ICU -On transfer to ICU CVP 6, co-ox 78 consistent with sepsis picture -Now on vanc/cefepime/flagyl and lactate down-trending to 4.8 this AM -Management per ICU team  #NSTEMI: #Known Multivessel CAD: -Patient presented with worsening dyspnea and chest pain found to have trop [phone] and BNP >4500 from 366 on prior labs -Cath 06/2022 with 99% D1, 60% D2, 95% D3 with heavily calcified/diffuse disease that was managed medically  -TTE with EF 25-30%, severe septal, mid-to-apical lateral, mid-to-apical anterior and mid-to- apical inferior hypokinesis with relatively preserved LV apex systolic motion, moderate MR, moderate to severe TR, mild to mod AS, mild to moderate AR -Cath deferred for now given concern for septic shock as above -Will re-consider ischemic evaluation once clinically improves -Heparin gtt held due to drop in H/H now s/p 1u pRBC with improvement -Continue ASA 81mg  daily -Continue lipitor 40mg  daily  #Acute on Chronic Systolic HF Exacerbation: -BNP >4500 (from 365 in 11/2022) on admission with pulmonary edema on CXR consistent with acute on chronic systolic HF exacerbation -CVP 6 on transfer to ICU now 14 with plans for HD today -On volume management with HD  #Acute on Chronic Hypoxic Respiratory Failure: -Due to acute on chronic systolic and  diastolic HF exacerbation as above -Volume management with HD  #HTN: -Holding nodal agents for now given concern for shock  #ESRD: -Management per Nephrology  #HLD: -Continue lipitor 40mg  daily  #GOC: -Discussion held with the daughter and the patient's wife and they wish to continue full scope of care with invasive measures if needed  CRITICAL CARE TIME: I have spent a total of 40 minutes with patient reviewing hospital notes, telemetry, EKGs, labs and examining the patient as well as establishing an assessment and plan that was discussed with the patient.  > 50% of time was spent in direct patient care. The patient is critically ill with multi-organ system failure and requires high complexity decision making for assessment and support, frequent evaluation and titration of therapies, application of advanced monitoring technologies and extensive interpretation of multiple databases.   For questions or updates, please contact [facility] Please consult [email or web address] for contact info under  Signed, Meriam Sprague, MD  01/17/2023, 6:40 AM

## 2023-01-17 NOTE — Progress Notes (Signed)
Heart Failure Navigator Progress Note  Assessed for Heart & Vascular TOC clinic readiness.  Patient does not meet criteria due to ESRD on hemodialysis.   Navigator will sign off at this time.    Livvy Spilman, BSN, RN Heart Failure Nurse Navigator Secure Chat Only   

## 2023-01-18 DIAGNOSIS — I5043 Acute on chronic combined systolic (congestive) and diastolic (congestive) heart failure: Secondary | ICD-10-CM | POA: Diagnosis not present

## 2023-01-18 DIAGNOSIS — I2510 Atherosclerotic heart disease of native coronary artery without angina pectoris: Secondary | ICD-10-CM | POA: Diagnosis not present

## 2023-01-18 DIAGNOSIS — N186 End stage renal disease: Secondary | ICD-10-CM | POA: Diagnosis not present

## 2023-01-18 DIAGNOSIS — I214 Non-ST elevation (NSTEMI) myocardial infarction: Secondary | ICD-10-CM | POA: Diagnosis not present

## 2023-01-18 LAB — HEPATIC FUNCTION PANEL
ALT: 453 U/L — ABNORMAL HIGH (ref 0–44)
AST: 356 U/L — ABNORMAL HIGH (ref 15–41)
Albumin: 2.5 g/dL — ABNORMAL LOW (ref 3.5–5.0)
Alkaline Phosphatase: 92 U/L (ref 38–126)
Bilirubin, Direct: 0.3 mg/dL — ABNORMAL HIGH (ref 0.0–0.2)
Indirect Bilirubin: 1 mg/dL — ABNORMAL HIGH (ref 0.3–0.9)
Total Bilirubin: 1.3 mg/dL — ABNORMAL HIGH (ref 0.3–1.2)
Total Protein: 6.8 g/dL (ref 6.5–8.1)

## 2023-01-18 LAB — GLUCOSE, CAPILLARY
Glucose-Capillary: 67 mg/dL — ABNORMAL LOW (ref 70–99)
Glucose-Capillary: 78 mg/dL (ref 70–99)
Glucose-Capillary: 102 mg/dL — ABNORMAL HIGH (ref 70–99)

## 2023-01-18 LAB — MAGNESIUM
Magnesium: 2 mg/dL (ref 1.7–2.4)
Magnesium: 2.2 mg/dL (ref 1.7–2.4)

## 2023-01-18 LAB — CBC
HCT: 27.8 % — ABNORMAL LOW (ref 39.0–52.0)
Hemoglobin: 8.7 g/dL — ABNORMAL LOW (ref 13.0–17.0)
MCH: 31.1 pg (ref 26.0–34.0)
MCHC: 31.3 g/dL (ref 30.0–36.0)
MCV: 99.3 fL (ref 80.0–100.0)
Platelets: 186 10*3/uL (ref 150–400)
RBC: 2.8 MIL/uL — ABNORMAL LOW (ref 4.22–5.81)
RDW: 20.8 % — ABNORMAL HIGH (ref 11.5–15.5)
WBC: 14.6 10*3/uL — ABNORMAL HIGH (ref 4.0–10.5)
nRBC: 18.1 % — ABNORMAL HIGH (ref 0.0–0.2)

## 2023-01-18 LAB — CULTURE, BLOOD (ROUTINE X 2)

## 2023-01-18 LAB — BASIC METABOLIC PANEL
Anion gap: 16 — ABNORMAL HIGH (ref 5–15)
BUN: 43 mg/dL — ABNORMAL HIGH (ref 8–23)
CO2: 23 mmol/L (ref 22–32)
Calcium: 7.5 mg/dL — ABNORMAL LOW (ref 8.9–10.3)
Chloride: 92 mmol/L — ABNORMAL LOW (ref 98–111)
Creatinine, Ser: 5.14 mg/dL — ABNORMAL HIGH (ref 0.61–1.24)
GFR, Estimated: 11 mL/min — ABNORMAL LOW (ref >60)
Glucose, Bld: 128 mg/dL — ABNORMAL HIGH (ref 70–99)
Potassium: 3.5 mmol/L (ref 3.5–5.1)
Sodium: 131 mmol/L — ABNORMAL LOW (ref 135–145)

## 2023-01-18 LAB — PHOSPHORUS
Phosphorus: 4.3 mg/dL (ref 2.5–4.6)
Phosphorus: 3.8 mg/dL (ref 2.5–4.6)

## 2023-01-18 LAB — HEPARIN LEVEL (UNFRACTIONATED): Heparin Unfractionated: 0.52 IU/mL (ref 0.30–0.70)

## 2023-01-18 LAB — HEPATITIS B SURFACE ANTIBODY, QUANTITATIVE: Hep B S AB Quant (Post): 8835 m[IU]/mL (ref >9.9)

## 2023-01-18 LAB — OCCULT BLOOD X 1 CARD TO LAB, STOOL: Fecal Occult Bld: POSITIVE — AB

## 2023-01-18 MED ORDER — HEPARIN BOLUS VIA INFUSION
4000.0000 [IU] | Freq: Once | INTRAVENOUS | Status: AC
  Administered 2023-01-18: 4000 [IU] via INTRAVENOUS
  Filled 2023-01-18: qty 4000

## 2023-01-18 MED ORDER — SEVELAMER CARBONATE 800 MG PO TABS
1600.0000 mg | ORAL_TABLET | Freq: Three times a day (TID) | ORAL | Status: DC
  Administered 2023-01-18 – 2023-01-19 (×3): 1600 mg
  Filled 2023-01-18 (×5): qty 2

## 2023-01-18 MED ORDER — DEXTROSE 50 % IV SOLN
25.0000 mL | Freq: Once | INTRAVENOUS | Status: AC
  Administered 2023-01-18: 25 mL via INTRAVENOUS
  Filled 2023-01-18: qty 50

## 2023-01-18 MED ORDER — CALCITRIOL 1 MCG/ML PO SOLN
3.2500 ug | ORAL | Status: DC
  Administered 2023-01-19: 3.3 ug via ORAL
  Filled 2023-01-18 (×2): qty 3.3

## 2023-01-18 MED ORDER — POTASSIUM CHLORIDE 20 MEQ PO PACK
40.0000 meq | PACK | Freq: Once | ORAL | Status: AC
  Administered 2023-01-18: 40 meq
  Filled 2023-01-18: qty 2

## 2023-01-18 MED ORDER — HEPARIN (PORCINE) 25000 UT/250ML-% IV SOLN
1550.0000 [IU]/h | INTRAVENOUS | Status: DC
  Administered 2023-01-18 (×2): 1550 [IU]/h via INTRAVENOUS
  Filled 2023-01-18 (×2): qty 250

## 2023-01-18 MED ORDER — METOCLOPRAMIDE HCL 5 MG/ML IJ SOLN
10.0000 mg | Freq: Three times a day (TID) | INTRAMUSCULAR | Status: AC
  Administered 2023-01-18: 10 mg via INTRAVENOUS
  Filled 2023-01-18: qty 2

## 2023-01-18 MED ORDER — PIPERACILLIN-TAZOBACTAM IN DEX 2-0.25 GM/50ML IV SOLN
2.2500 g | Freq: Three times a day (TID) | INTRAVENOUS | Status: DC
  Administered 2023-01-18 – 2023-01-21 (×8): 2.25 g via INTRAVENOUS
  Filled 2023-01-18 (×12): qty 50

## 2023-01-18 MED ORDER — ATORVASTATIN CALCIUM 40 MG PO TABS
40.0000 mg | ORAL_TABLET | Freq: Every day | ORAL | Status: DC
  Administered 2023-01-18 – 2023-01-21 (×3): 40 mg
  Filled 2023-01-18 (×3): qty 1

## 2023-01-18 MED ORDER — RENA-VITE PO TABS
1.0000 | ORAL_TABLET | Freq: Every day | ORAL | Status: DC
  Administered 2023-01-19: 1
  Filled 2023-01-18: qty 1

## 2023-01-18 MED ORDER — PHENOL 1.4 % MT LIQD
1.0000 | OROMUCOSAL | Status: DC | PRN
  Filled 2023-01-18: qty 177

## 2023-01-18 NOTE — Progress Notes (Signed)
ANTICOAGULATION CONSULT NOTE - Follow Up Consult  Pharmacy Consult for Heparin Indication: chest pain/ACS  No Known Allergies  Patient Measurements: Height: 5\' 10"  (177.8 cm) Weight: 65.4 kg (144 lb 2.9 oz) IBW/kg (Calculated) : 73 Heparin Dosing Weight: 65.4 kg  Vital Signs: Temp: 97.5 F (36.4 C) (05/07 1605) Temp Source: Oral (05/07 1605) BP: 115/81 (05/07 1605) Pulse Rate: 102 (05/07 1605)  Labs: Recent Labs    01/16/23 0345 01/16/23 0928 01/16/23 1224 01/16/23 1636 01/17/23 0423 01/18/23 0440 01/18/23 1700  HGB 8.7*  --   --  7.2* 8.6* 8.7*  --   HCT 27.7*  --   --  24.4* 26.9* 27.8*  --   PLT 192  --   --  205 196 186  --   APTT  --  91*  --   --   --   --   --   LABPROT  --  21.8*  --   --   --   --   --   INR  --  1.9*  --   --   --   --   --   HEPARINUNFRC 0.23*  --  0.20*  --   --   --  0.52  CREATININE 6.32* 6.95*  --   --  7.34* 5.14*  --   TROPONINIHS  --  2,679*  --   --   --   --   --     Estimated Creatinine Clearance: 10.4 mL/min (A) (by C-G formula based on SCr of 5.14 mg/dL (H)).  Assessment: 82 y.o. male with NSTEMI on heparin. Plans for potential cath this hospitalization. He was prescribed apixaban PTA for hx of DVT, but reports nonadherence. Heparin was stopped on 5/6 due to drop in Hgb. FOBT + this monrning but Hgb 8.7 and stable. No overt bleeding noted by RN. Pharmacy consulted to re-initiate heparin for ACS and pending cath.   Heparin level is therapeutic (0.52) on 1550 units/hr. No bleeding reported.  Goal of Therapy:  Heparin level 0.3-0.7 units/ml Monitor platelets by anticoagulation protocol: Yes   Plan:  Continue heparin drip at 1550 units/hr Next heparin level and CBC in am. Monitor for signs/symptoms of bleeding. Follow up FOB checks.  Dennie Fetters, RPh 01/18/2023,7:13 PM

## 2023-01-18 NOTE — Evaluation (Signed)
Occupational Therapy Evaluation Patient Details Name: Travis [name] MRN: [id number] DOB: 1940-12-14 Today's Date: 01/18/2023   History of Present Illness 82 y.o. male admitted 5/3 with SOB, NSTEMI, acute on chronic CHF. 5/5 progressive AMS. PMHx: R hip fx s/p THA march 2024, HFrEF, ESRD on HD, GERD, HTN, CAD, anemia, arthritis, pulmonary fibrosis, sjogren's disease   Clinical Impression   Per family,pt has been relatively dependent for ADLs (reports he is sometimes able to self-feed and perform grooming tasks), aide/family transfer pt max A. Has an aide and family that assist at home. Pt currently close to baseline, needing min-max A for ADLs, mod A for bed mobility, and mod A +2 for transfers with RW. Difficulty obtaining SpO2 during session, session conducted on RA and 2L O2 donned at end of session. Pt presenting with impairments listed below, will follow acutely. Pt/family with preference for returning home, recommend HHOT at d/c as long as family/aide able to provide level of assist needed 24/7.     Recommendations for follow up therapy are one component of a multi-disciplinary discharge planning process, led by the attending physician.  Recommendations may be updated based on patient status, additional functional criteria and insurance authorization.   Assistance Recommended at Discharge Frequent or constant Supervision/Assistance  Patient can return home with the following Two people to help with walking and/or transfers;A lot of help with bathing/dressing/bathroom;Assistance with cooking/housework;Assistance with feeding;Direct supervision/assist for medications management;Direct supervision/assist for financial management;Assist for transportation;Help with stairs or ramp for entrance    Functional Status Assessment  Patient has had a recent decline in their functional status and demonstrates the ability to make significant improvements in function in a reasonable and predictable amount  of time.  Equipment Recommendations  Hospital bed;Other (comment);Tub/shower seat (hoyer lift)    Recommendations for Other Services PT consult     Precautions / Restrictions Precautions Precautions: Fall Restrictions Weight Bearing Restrictions: No      Mobility Bed Mobility Overal bed mobility: Needs Assistance Bed Mobility: Supine to Sit     Supine to sit: Mod assist, HOB elevated          Transfers Overall transfer level: Needs assistance   Transfers: Sit to/from Stand, Bed to chair/wheelchair/BSC Sit to Stand: Mod assist, +2 physical assistance Stand pivot transfers: Mod assist, +2 physical assistance         General transfer comment: mod +2 assist with right knee blocked to rise from surface with cues for hand placement x 3 trials with pt inctontinent of stool and sitting on bed pan on 2nd trials. Max assist for pericare and pivot with RW bed to recliner      Balance     Sitting balance-Leahy Scale: Fair       Standing balance-Leahy Scale: Poor                             ADL either performed or assessed with clinical judgement   ADL Overall ADL's : Needs assistance/impaired Eating/Feeding: Minimal assistance   Grooming: Minimal assistance   Upper Body Bathing: Maximal assistance   Lower Body Bathing: Maximal assistance   Upper Body Dressing : Maximal assistance   Lower Body Dressing: Maximal assistance   Toilet Transfer: Moderate assistance;+2 for physical assistance   Toileting- Clothing Manipulation and Hygiene: Total assistance       Functional mobility during ADLs: Moderate assistance;+2 for physical assistance;Rolling walker (2 wheels)       Vision   Vision  Assessment?: No apparent visual deficits     Perception Perception Perception Tested?: No   Praxis Praxis Praxis tested?: Not tested    Pertinent Vitals/Pain Pain Assessment Pain Assessment: No/denies pain     Hand Dominance     Extremity/Trunk  Assessment Upper Extremity Assessment Upper Extremity Assessment: Generalized weakness   Lower Extremity Assessment Lower Extremity Assessment: Defer to PT evaluation   Cervical / Trunk Assessment Cervical / Trunk Assessment: Kyphotic;Other exceptions Cervical / Trunk Exceptions: forward head with maintained neck flexion   Communication Communication Communication: No difficulties   Cognition Arousal/Alertness: Awake/alert Behavior During Therapy: Flat affect Overall Cognitive Status: Impaired/Different from baseline Area of Impairment: Orientation, Memory, Following commands, Safety/judgement                 Orientation Level: Time, Place   Memory: Decreased short-term memory Following Commands: Follows one step commands inconsistently, Follows one step commands with increased time Safety/Judgement: Decreased awareness of safety, Decreased awareness of deficits     General Comments: depressed affect, perseverative on needing oxygen     General Comments  SpO2 with poor wave pleth on RA, unable to obtain reading, 2L O2 donned at end of session with RN aware    Exercises     Shoulder Instructions      Home Living Family/patient expects to be discharged to:: Private residence Living Arrangements: Spouse/significant other;Non-relatives/Friends Available Help at Discharge: Family;Available 24 hours/day;Personal care attendant Type of Home: House Home Access: Ramped entrance     Home Layout: One level;Laundry or work area in [employer] Shower/Tub: Walk-in shower;Sponge bathes at baseline   [employer]: Standard     Home Equipment: Medical laboratory scientific officer - single point;Rollator (4 wheels);Other (comment);Rolling Walker (2 wheels);Toilet riser;Transport chair;BSC/3in1   Additional Comments: pt has PCA and was receiving HH services      Prior Functioning/Environment Prior Level of Function : Needs assist             Mobility Comments: max assist for stand  pivot transfers from bed to [facility] and transport chair, has only taken pivotal steps at home since THA in March ADLs Comments: aide performs sponge bathes, total assist for pericare. Family and aide perform IADLs, per family pt able to feed himself and perform basic grooming tasks "sometimes"        OT Problem List: Decreased strength;Decreased range of motion;Decreased activity tolerance;Impaired balance (sitting and/or standing);Decreased safety awareness;Cardiopulmonary status limiting activity;Decreased cognition      OT Treatment/Interventions: Self-care/ADL training;Therapeutic exercise;Energy conservation;DME and/or AE instruction;Therapeutic activities;Balance training;Patient/family education;Cognitive remediation/compensation    OT Goals(Current goals can be found in the care plan section) Acute Rehab OT Goals Patient Stated Goal: none stated OT Goal Formulation: With patient Time For Goal Achievement: 02/01/23 Potential to Achieve Goals: Good ADL Goals Pt Will Perform Upper Body Dressing: with min assist;sitting Pt Will Perform Lower Body Dressing: with mod assist;sit to/from stand;sitting/lateral leans Pt Will Transfer to Toilet: with min assist;with +2 assist;squat pivot transfer;stand pivot transfer;bedside commode  OT Frequency: Min 1X/week    Co-evaluation PT/OT/SLP Co-Evaluation/Treatment: Yes Reason for Co-Treatment: Complexity of the patient's impairments (multi-system involvement) PT goals addressed during session: Mobility/safety with mobility OT goals addressed during session: ADL's and self-care      AM-PAC OT "6 Clicks" Daily Activity     Outcome Measure Help from another person eating meals?: A Little Help from another person taking care of personal grooming?: A Little Help from another person toileting, which includes using toliet, bedpan, or  urinal?: A Lot Help from another person bathing (including washing, rinsing, drying)?: A Lot Help from another person  to put on and taking off regular upper body clothing?: A Lot Help from another person to put on and taking off regular lower body clothing?: A Lot 6 Click Score: 14   End of Session Equipment Utilized During Treatment: Rolling walker (2 wheels);Gait belt;Oxygen Nurse Communication: Mobility status  Activity Tolerance: Patient tolerated treatment well Patient left: in chair;with call bell/phone within reach;with chair alarm set;with family/visitor present  OT Visit Diagnosis: Unsteadiness on feet (R26.81);Other abnormalities of gait and mobility (R26.89);Muscle weakness (generalized) (M62.81)                Time: 0865-7846 OT Time Calculation (min): 26 min Charges:  OT General Charges $OT Visit: 1 Visit OT Evaluation $OT Eval Moderate Complexity: 1 Mod  Shekelia Boutin K, OTD, OTR/L SecureChat Preferred Acute Rehab [phone]   Carver Fila Koonce 01/18/2023, 12:40 PM

## 2023-01-18 NOTE — Evaluation (Signed)
Clinical/Bedside Swallow Evaluation Patient Details  Name: Travis [name] MRN: [id number] Date of Birth: August 24, 1941  Today's Date: 01/18/2023 Time: SLP Start Time (ACUTE ONLY): 4098 SLP Stop Time (ACUTE ONLY): 0951 SLP Time Calculation (min) (ACUTE ONLY): 9 min  Past Medical History:  Past Medical History:  Diagnosis Date   Anemia    Arthritis    Chronic combined systolic and diastolic CHF (congestive heart failure) (HCC)    a. Etiology of low EF not defined.   DVT (deep venous thrombosis) (HCC)    a. 01/2015: RLE DVT. VQ scan intermediate probability. Underwent renal bx complicated by perinephric hematoma; anticoagulation stopped and IVC filter placed.   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 Received plasma exchange and steroids but didn't respond and ended up starting hemodialysis June 2016.    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   Protein calorie malnutrition (HCC)    Pulmonary fibrosis (HCC)    Sjogren's disease (HCC) 01/28/2015   Past Surgical History:  Past Surgical History:  Procedure Laterality Date   AV FISTULA PLACEMENT Right 02/17/2015   Procedure: INSERTION OF RIGHT ARM  ARTERIOVENOUS (AV) GORE-TEX GRAFT ;  Surgeon: Sherren Kerns, MD;  Location: [facility] OR;  Service: Vascular;  Laterality: Right;   FLEXIBLE SIGMOIDOSCOPY N/A 08/04/2015   Procedure: FLEXIBLE SIGMOIDOSCOPY;  Surgeon: Charlott Rakes, MD;  Location: [facility] ENDOSCOPY;  Service: Endoscopy;  Laterality: N/A;   HEMORROIDECTOMY  1999   INSERTION OF DIALYSIS CATHETER N/A 02/17/2015   Procedure: INSERTION OF DIALYSIS  CATHETER RIGHT INTERNAL JUGULAR VEIN;  Surgeon: Sherren Kerns, MD;  Location: [facility] OR;  Service: Vascular;  Laterality: N/A;   MULTIPLE TOOTH EXTRACTIONS     PERIPHERAL VASCULAR BALLOON ANGIOPLASTY Right 01/23/2019   Procedure: PERIPHERAL VASCULAR BALLOON ANGIOPLASTY;  Surgeon: Sherren Kerns, MD;  Location: [facility] INVASIVE CV LAB;  Service: Cardiovascular;  Laterality: Right;   REVISION OF ARTERIOVENOUS GORETEX GRAFT Right 03/29/2017   Procedure: REVISION OF ARTERIOVENOUS GORETEX GRAFT;  Surgeon: Fransisco Hertz, MD;  Location: [facility] OR;  Service: Vascular;  Laterality: Right;   RIGHT/LEFT HEART CATH AND CORONARY ANGIOGRAPHY N/A 07/06/2022   Procedure: RIGHT/LEFT HEART CATH AND CORONARY ANGIOGRAPHY;  Surgeon: Marykay Lex, MD;  Location: [facility] INVASIVE CV LAB;  Service: Cardiovascular;  Laterality: N/A;   Surgical procedure to remove a mole as a child Right Eye area   At around 66 years old   TOTAL HIP ARTHROPLASTY Right 12/06/2022   Procedure: TOTAL HIP ARTHROPLASTY ANTERIOR APPROACH;  Surgeon: Samson Frederic, MD;  Location: [facility] OR;  Service: Orthopedics;  Laterality: Right;   HPI:  Travis [name] is an 82 yo M who presented to ED 5/3 with SOB. Admitted to [facility] for management of NSTEMI, Acute on Chronic HF. Chest CT 5/6: "Increased bilateral lower lobe, left-greater-than-right consolidative opacities and irregular nodularity with new multifocal tree-in-bud and ground-glass nodules within the dependent upper lobes. Findings are suspicious for multifocal pneumonia."  Pt with PMH ESRD, HFrEF, HTN, mvCAD.    Assessment / Plan / Recommendation  Clinical Impression  Pt presents with clinical indicators  of pharyngeal dysphagia.  SLP provided oral care prior to administration of PO trials.  Pt gagging with stinulation to anterior half of togue.  With trials of thin liquid (2 sips by cup) pt required multiple swallows (2-3) per bolus and exhibited grimacing.  Pt reported feeling of something stuck in throat or  back of mouth.  It was unclear if pt saying that water wasn't going down.  He said he had food in mouth.  Pt exhibited coughing, gagging, and possible regurgitation.  Substance similar in color to tube feeds was removed by suction.  Further bolus trials were deferred.  Recommend pt remain NPO with alternate means of nutrition, hydration, and medication. Pt may have ice chips in moderation, after good oral care, when fully awake/alert, with upright positioning and direct supervision.   SLP Visit Diagnosis: Dysphagia, unspecified (R13.10)    Aspiration Risk  Moderate aspiration risk    Diet Recommendation NPO;Alternative means - temporary   Medication Administration: Via alternative means    Other  Recommendations Oral Care Recommendations: Oral care prior to ice chip/H20;Oral care QID;Staff/trained caregiver to provide oral care    Recommendations for follow up therapy are one component of a multi-disciplinary discharge planning process, led by the attending physician.  Recommendations may be updated based on patient status, additional functional criteria and insurance authorization.  Follow up Recommendations  (at present, continue ST at next level of care)      Assistance Recommended at Discharge  N/A  Functional Status Assessment Patient has had a recent decline in their functional status and demonstrates the ability to make significant improvements in function in a reasonable and predictable amount of time.  Frequency and Duration min 2x/week  2 weeks       Prognosis Prognosis for improved oropharyngeal function: Good      Swallow Study   General Date of Onset: 02/07/2023 HPI: Travis [name] is an 82 yo M who presented to ED 5/3 with SOB. Admitted to [facility] for management of NSTEMI, Acute on Chronic HF. Chest CT 5/6: "Increased bilateral lower lobe, left-greater-than-right consolidative opacities and irregular nodularity with new multifocal tree-in-bud and ground-glass nodules within  the dependent upper lobes. Findings are suspicious for multifocal pneumonia."  Pt with PMH ESRD, HFrEF, HTN, mvCAD. Type of Study: Bedside Swallow Evaluation Previous Swallow Assessment: Prior clinical swallow assessment in 2023 and 2016 [facility] Diet Prior to this Study: NPO;Cortrak/Small bore NG tube Temperature Spikes Noted: No Respiratory Status: Nasal cannula History of Recent Intubation: No Behavior/Cognition: Alert;Pleasant mood;Cooperative Oral Cavity Assessment: Within Functional Limits Oral Care Completed by SLP: Yes Oral Cavity - Dentition: Dentures, top;Missing dentition Self-Feeding Abilities: Total assist Patient Positioning: Upright in bed Baseline Vocal Quality: Low vocal intensity Volitional Cough: Strong    Oral/Motor/Sensory Function Overall Oral Motor/Sensory Function: Within functional limits Facial ROM: Within Functional Limits Facial Symmetry: Within Functional Limits Lingual ROM: Within Functional Limits Lingual Symmetry: Within Functional Limits Lingual Strength: Within Functional Limits Velum: Within Functional Limits Mandible: Within Functional Limits   Ice Chips Ice chips: Not tested   Thin Liquid Thin Liquid: Impaired Presentation: Cup Pharyngeal  Phase Impairments: Multiple swallows    Nectar Thick Nectar Thick Liquid: Not tested   Honey Thick Honey Thick Liquid: Not tested   Puree Puree: Not tested   Solid     Solid: Not tested      Kerrie Pleasure, MA, CCC-SLP Acute Rehabilitation Services Office: [phone] 01/18/2023,10:07 AM

## 2023-01-18 NOTE — Progress Notes (Signed)
Patient transfer from 2H to 3E, tube feeding stopped by [facility] nurse due to nausea per report. Patient admitted to 3E , CBG checked 35, RN paged Dr Katrinka Blazing, he gave orders for D5 25 ml IV and to restart tube feeding at 20 ml per hour. See new orders.

## 2023-01-18 NOTE — Progress Notes (Signed)
[facility] Progress Note    P HD: [facility] MWF 3.5h  400/1.5   67.4kg   2/2 bath  LFA AVG  Heparin none - last HD 5/03, post wt 66.7kg - rocaltrol 2.5 mcg po tiw - sensipar 150mg  po tiw - mircera 200 mcg IV q 2wks, last 4/24, due 5/08     Assessment/ Plan: Leukocytosis-WBC 24.9 -> 14.6. BC NTD p 2 days.  Acute hypoxic resp failure - w/ pulm edema on CXR and also ^trops c/w possible NSTEMI. Went for HD 5/6 w/ 2L off. Possibly with flash pulm edema from nstemi.    NSTEMI, cardiology consulting. 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 Had usual HD yesterday at his OP unit. Then was admitted and had another HD overnight as above. HD 01/15/2023 w/ 2L net uf, 5/6 1.8L .   Plan on HD Wed with UPF2. FTT concerning and at current time RRT not improving QOL.   HTN/ volume - needs good standing wt or good bed weight. No edema on exam this am. BP's are wnl.  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 Follow.  MBD ckd - CCa and phos are in range. Cont renvela as binder w/ sensipar and po vdra.  HFrEF -Repeat echo 01/15/23 25-30%.  H/o DVT - sp IVC filter. Takes eliquis at home, IV heparin gtt off due to concern of bleeding H/o hypotension - takes midodrine pre HD on mwf.    Subjective: seen in room. More alert today but mainly grunting.  Objective Vitals:   01/18/23 0800 01/18/23 0900 01/18/23 1000 01/18/23 1100  BP: 112/76 106/77 121/78 106/83  Pulse: 97 97  96  Resp: (!) 30 (!) 34 (!) 38 (!) 28  Temp:      TempSrc:      SpO2: 100% 97%  97%  Weight:      Height:       Physical Exam General: Chronically ill very thin male in NAD Heart: S1,S2 2/6 systolic M. No R/G SR on monitor.  Lungs: CTAB decreased in bases. No WOB.  Abdomen: NABS, non tender Extremities: No LE edema Dialysis Access: L AVG + T/B   Additional Objective Labs: Basic Metabolic Panel: Recent Labs  Lab 01/16/23 0928 01/17/23 0423 01/17/23 1003 01/17/23 1841 01/18/23 0440  NA 136 132*  --   --  131*  K 5.1  4.3  --   --  3.5  CL 91* 92*  --   --  92*  CO2 16* 22  --   --  23  GLUCOSE 144* 89  --   --  128*  BUN 47* 66*  --   --  43*  CREATININE 6.95* 7.34*  --   --  5.14*  CALCIUM 8.3* 7.5*  --   --  7.5*  PHOS  --   --  3.3 4.0 4.3   Liver Function Tests: Recent Labs  Lab 01/15/23 0835 01/16/23 0928 01/18/23 0440  AST  --  492* 356*  ALT  --  403* 453*  ALKPHOS  --  86 92  BILITOT  --  1.3* 1.3*  PROT  --  7.4 6.8  ALBUMIN 3.0* 2.8* 2.5*   Recent Labs  Lab 02/08/2023 1841 01/16/23 1039  LIPASE 32 40   CBC: Recent Labs  Lab 02/01/2023 1841 01/15/23 0835 01/16/23 0345 01/16/23 1636 01/17/23 0423 01/18/23 0440  WBC 15.1* 16.7* 24.9* 24.1* 18.1* 14.6*  NEUTROABS 12.2*  --   --   --   --   --  HGB 8.0* 7.8* 8.7* 7.2* 8.6* 8.7*  HCT 26.2* 25.7* 27.7* 24.4* 26.9* 27.8*  MCV 99.6 98.5 96.9 103.0* 96.8 99.3  PLT 191 218 192 205 196 186   Blood Culture    Component Value Date/Time   SDES BLOOD LEFT HAND 01/16/2023 0948   SPECREQUEST  01/16/2023 0948    BOTTLES DRAWN AEROBIC AND ANAEROBIC Blood Culture adequate volume   CULT  01/16/2023 0948    NO GROWTH 2 DAYS Performed at [facility], [address], [place], [place] [address]    REPTSTATUS PENDING 01/16/2023 [phone]    Cardiac Enzymes: No results for input(s): "CKTOTAL", "CKMB", "CKMBINDEX", "TROPONINI" in the last 168 hours. CBG: Recent Labs  Lab 01/16/23 1214 01/16/23 1223 01/17/23 1139 01/17/23 1204 01/17/23 1553  GLUCAP <10* 175* 39* 77 90   Iron Studies: No results for input(s): "IRON", "TIBC", "TRANSFERRIN", "FERRITIN" in the last 72 hours. @lablastinr3 @ Studies/Results: US Abdomen Limited RUQ (LIVER/GB)  Result Date: 01/17/2023 CLINICAL DATA:  Abdominal pain. EXAM: ULTRASOUND ABDOMEN LIMITED RIGHT UPPER QUADRANT COMPARISON:  CT abdomen pelvis 01/16/2023 FINDINGS: Gallbladder: There are shadowing gallstones measuring up to 1.2 cm. No gallbladder wall thickening. No sonographic Murphy sign noted by  sonographer. Common bile duct: Diameter: 0.2 cm, within normal limits Liver: No focal lesion identified. Within normal limits in parenchymal echogenicity. Portal vein is patent on color Doppler imaging with normal direction of blood flow towards the liver. Other: Echogenic right kidney. IMPRESSION: 1. Cholelithiasis without sonographic evidence of acute cholecystitis. 2. Echogenic right kidney which is nonspecific but can be seen in the setting of medical renal disease. Electronically Signed   By: Emmaline Kluver M.D.   On: 01/17/2023 17:17   CT CHEST WO CONTRAST  Result Date: 01/17/2023 CLINICAL DATA:  Suspected sepsis for evaluation of pneumonia EXAM: CT CHEST WITHOUT CONTRAST TECHNIQUE: Multidetector CT imaging of the chest was performed following the standard protocol without IV contrast. RADIATION DOSE REDUCTION: This exam was performed according to the departmental dose-optimization program which includes automated exposure control, adjustment of the mA and/or kV according to patient size and/or use of iterative reconstruction technique. COMPARISON:  CT chest dated 09/19/2022 FINDINGS: Cardiovascular: Left IJ central venous catheter tip terminates in the lower SVC. Right brachiocephalic vein stent in-situ. Increased multichamber cardiomegaly. No significant pericardial fluid/thickening. Similar dilated pulmonary artery measures 3.6 cm. Extensive coronary artery calcifications. Aortic atherosclerosis. Mediastinum/Nodes: Imaged thyroid gland without nodules meeting criteria for imaging follow-up by size. Normal esophagus. Similar multi station lymphadenopathy, for example pretracheal measures 10 mm (3:49) and left supraclavicular measures 111 mm (3:18). Lungs/Pleura: The central airways are patent. Trace layering secretions within the trachea. Similar lower lobe predominant diffuse bronchial wall thickening. Mild interlobular septal thickening is again seen. Multifocal tree-in-bud and ground-glass nodules  within the dependent upper lobes are new. Increased bilateral lower lobe, left-greater-than-right consolidative opacities and irregular nodularity. A few scattered cysts are unchanged. No pneumothorax. Similar small bilateral pleural effusions. Unchanged simple fluid attenuation loculated component along the minor fissure and posterior right upper lung. Additional areas of loculated pleural effusion along the left lateral upper lung and major fissure have decreased or resolved. Upper abdomen: Enteric tube reaches the stomach terminates below the field of view. Innumerable bilateral renal cysts are better evaluated on prior CT abdomen and pelvis. Musculoskeletal: No acute or abnormal lytic or blastic osseous lesions. Similar bilateral gynecomastia. IMPRESSION: 1. Increased bilateral lower lobe, left-greater-than-right consolidative opacities and irregular nodularity with new multifocal tree-in-bud and ground-glass nodules within the dependent  upper lobes. Findings are suspicious for multifocal pneumonia. 2. Similar small bilateral pleural effusions with simple fluid attenuation loculated component along the minor fissure and posterior right upper lung. Additional areas of loculated pleural effusion along the left lateral upper lung and major fissure have decreased or resolved. 3. Similar multi station lymphadenopathy, likely reactive. 4. Increased multichamber cardiomegaly. 5. Similar dilated pulmonary artery, which can be seen in the setting of pulmonary hypertension. 6. Aortic Atherosclerosis (ICD10-I70.0). Coronary artery calcifications. Assessment for potential risk factor modification, dietary therapy or pharmacologic therapy may be warranted, if clinically indicated. Electronically Signed   By: Agustin Cree M.D.   On: 01/17/2023 15:37   DG Abd Portable 1V  Result Date: 01/17/2023 CLINICAL DATA:  Feeding tube placement. EXAM: PORTABLE ABDOMEN - 1 VIEW COMPARISON:  KUB 07/31/2015, CT abdomen/pelvis 01/16/2023.  FINDINGS: The enteric catheter tip projects over the expected location of the distal stomach. There is a nonobstructive bowel gas pattern. Enteric contrast is noted in the large bowel. An IVC filter is noted. There is no definite free intraperitoneal air, within the confines of supine technique. A left internal jugular venous catheter projects over the mid SVC. The cardiomediastinal silhouette is stable. There is a right pleural effusion with ovoid opacity projecting over the right midlung, unchanged unchanged and again likely reflecting fissural fluid. A small left pleural effusion is not significantly changed. IMPRESSION: Enteric catheter tip in the expected location of the distal stomach. Electronically Signed   By: Lesia Hausen M.D.   On: 01/17/2023 14:27   CT ABDOMEN PELVIS W CONTRAST  Result Date: 01/16/2023 CLINICAL DATA:  Sepsis EXAM: CT ABDOMEN AND PELVIS WITH CONTRAST TECHNIQUE: Multidetector CT imaging of the abdomen and pelvis was performed using the standard protocol following bolus administration of intravenous contrast. RADIATION DOSE REDUCTION: This exam was performed according to the departmental dose-optimization program which includes automated exposure control, adjustment of the mA and/or kV according to patient size and/or use of iterative reconstruction technique. CONTRAST:  75mL OMNIPAQUE IOHEXOL 350 MG/ML SOLN COMPARISON:  05/23/2021, 01/16/2023 FINDINGS: Lower chest: The heart is enlarged without pericardial effusion. There are small loculated bilateral pleural effusions and pleural thickening. Bibasilar bronchial wall thickening. Hepatobiliary: No focal liver abnormality. Calcified gallstones are identified without gallbladder wall thickening. No biliary duct dilation. Pancreas: Unremarkable. No pancreatic ductal dilatation or surrounding inflammatory changes. Spleen: Normal in size without focal abnormality. Adrenals/Urinary Tract: Stable cystic changes in bilateral renal cortical  thinning. No specific imaging follow-up is recommended. No urinary tract calculi or obstructive uropathy. Bladder is decompressed, limiting its evaluation. The adrenals are stable. Stomach/Bowel: No bowel obstruction or ileus. Normal appendix right lower quadrant. Scattered colonic diverticulosis without evidence of acute diverticulitis. No bowel wall thickening or inflammatory change. Vascular/Lymphatic: Aortic atherosclerosis. Stable IVC filter. No enlarged abdominal or pelvic lymph nodes. Reproductive: Prostate is enlarged. Evaluation limited due to streak artifact from right hip arthroplasty. Other: Trace free fluid within the upper abdomen. No free intraperitoneal gas. No abdominal wall hernia. Musculoskeletal: Right hip arthroplasty. No acute or destructive bony abnormalities. IMPRESSION: 1. Bilateral loculated pleural effusions and associated pleural thickening. 2. Marked cardiomegaly. 3. Cholelithiasis without CT evidence of acute cholecystitis. 4. Trace upper abdominal ascites. 5. Colonic diverticulosis without diverticulitis. 6. Stable enlarged prostate. 7. Stable bilateral renal cortical thinning and cystic change, consistent with chronic renal insufficiency. 8.  Aortic Atherosclerosis (ICD10-I70.0). Electronically Signed   By: Sharlet Salina M.D.   On: 01/16/2023 18:28   DG CHEST PORT 1 VIEW  Result Date:  01/16/2023 CLINICAL DATA:  Central line placement. EXAM: PORTABLE CHEST 1 VIEW COMPARISON:  01/16/2023 at 9:56 a.m. and older exams. FINDINGS: New left internal jugular central venous catheter has its tip in the mid superior vena cava. No pneumothorax. No other change from the exam obtained earlier today. IMPRESSION: 1. Left internal jugular central venous catheter tip projects in the mid superior vena cava. No pneumothorax. Electronically Signed   By: Amie Portland M.D.   On: 01/16/2023 14:08   Medications:  sodium chloride     albumin human     feeding supplement (VITAL 1.5 CAL) 35 mL/hr at  01/18/23 1100   heparin 1,550 Units/hr (01/18/23 1100)   piperacillin-tazobactam (ZOSYN)  IV      sodium chloride   Intravenous Once   aspirin  81 mg Per Tube Daily   atorvastatin  40 mg Per Tube Daily   [START ON 01/19/2023] calcitRIOL  3.3 mcg Oral Q M,W,F-HD   Chlorhexidine Gluconate Cloth  6 each Topical Q0600   cinacalcet  90 mg Oral Q M,W,F-HD   feeding supplement (PROSource TF20)  60 mL Per Tube Daily   midodrine  10 mg Oral Q M,W,F   multivitamin  1 tablet Per Tube QHS   pantoprazole (PROTONIX) IV  40 mg Intravenous Q12H   sevelamer carbonate  1,600 mg Per Tube TID WC   sodium chloride flush  3 mL Intravenous Q12H

## 2023-01-18 NOTE — TOC Progression Note (Addendum)
Transition of Care [facility]) - Progression Note    Patient Details  Name: Shaikh Zobell MRN: [id number] Date of Birth: 12/14/1940  [facility] CM/SW Contact  Mariea Stable Davene Costain, RN Phone Number: 01/18/2023, 2:22 PM  Clinical Narrative:  PT/OT recommending 24/7 care at discharge. Pt's dtr discussed 01/17/2023 they would provide care at home. Attempted call to pt's dtr, Marylene Land to discuss [facility] and 24/7 care at home. Unable to leave a message, mailbox full.     Expected Discharge Plan: Home w Home Health Services Barriers to Discharge: (P) Continued Medical Work up  Expected Discharge Plan and Services   Discharge Planning Services: CM Consult Post Acute Care Choice: Home Health Living arrangements for the past 2 months: Single Family Home                                       Social Determinants of Health (SDOH) Interventions SDOH Screenings   Food Insecurity: No Food Insecurity (01/16/2023)  Housing: Low Risk  (01/20/2023)  Transportation Needs: No Transportation Needs (02/04/2023)  Utilities: Not At Risk (01/24/2023)  Tobacco Use: Low Risk  (01/16/2023)    Readmission Risk Interventions    07/05/2022   12:00 PM 12/22/2021    5:33 PM 05/26/2021   12:03 PM  Readmission Risk Prevention Plan  Transportation Screening Complete Complete Complete  PCP or Specialist Appt within 3-5 Days  Complete Complete  HRI or Home Care Consult Complete Complete Complete  Social Work Consult for Recovery Care Planning/Counseling Complete Complete Complete  Palliative Care Screening Not Applicable Not Applicable Not Applicable  Medication Review (RN Care Manager) Referral to Pharmacy Complete Complete

## 2023-01-18 NOTE — Progress Notes (Addendum)
ANTICOAGULATION CONSULT NOTE Pharmacy Consult for Heparin Indication: chest pain/ACS  No Known Allergies  Patient Measurements: Height: 5\' 10"  (177.8 cm) Weight: 69.7 kg (153 lb 10.6 oz) IBW/kg (Calculated) : 73 Heparin Dosing Weight: 74.8 kg  Vital Signs: Temp: 97.6 F (36.4 C) (05/07 0732) Temp Source: Oral (05/07 0732) BP: 112/76 (05/07 0800) Pulse Rate: 97 (05/07 0800)  Labs: Recent Labs    01/15/23 1809 01/16/23 0345 01/16/23 0345 01/16/23 0928 01/16/23 1224 01/16/23 1636 01/17/23 0423 01/18/23 0440  HGB  --  8.7*   < >  --   --  7.2* 8.6* 8.7*  HCT  --  27.7*   < >  --   --  24.4* 26.9* 27.8*  PLT  --  192   < >  --   --  205 196 186  APTT  --   --   --  91*  --   --   --   --   LABPROT  --   --   --  21.8*  --   --   --   --   INR  --   --   --  1.9*  --   --   --   --   HEPARINUNFRC 0.20* 0.23*  --   --  0.20*  --   --   --   CREATININE  --  6.32*   < > 6.95*  --   --  7.34* 5.14*  TROPONINIHS  --   --   --  2,679*  --   --   --   --    < > = values in this interval not displayed.     Estimated Creatinine Clearance: 11.1 mL/min (A) (by C-G formula based on SCr of 5.14 mg/dL (H)).   Assessment: 82 y.o. male with NSTEMI on heparin. Plans for potential cath this hospitalization. He was prescribed apixaban PTA for hx of DVT, but reports nonadherence. Heparin was stopped on 5/6 due to drop in Hgb. FOBT + this monrning but Hgb 8.7 and stable. No overt bleeding noted by RN. Pharmacy consulted to re-initiate heparin for ACS and pending cath.   On 5/5 heparin level 0.2 on 1400 units/hr  Goal of Therapy:  Heparin level 0.3-0.7 units/ml Monitor platelets by anticoagulation protocol: Yes   Plan:  -Heparin bolus 4000 units x1 then start 1550 units/hr -Heparin level in 8 hours -Monitor heparin level, CBC and s/s of bleeding daily  Travis [name], PharmD PGY1 Pharmacy Resident 01/18/2023 8:54 AM

## 2023-01-18 NOTE — Progress Notes (Signed)
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  He had new wall motion abnormalities, patient had refractory shock thought to be cardiogenic versus septic placed on antibiotics.  On 5/11, he developed new onset bright red blood per rectum.  No evidence of bleeding prior to this.  Currently on a heparin drip.  Last hemoglobin 8.7  Non-camera room  eICU Interventions  Trend hemoglobin 6 hours.  Sent for type and screen with next lab draw.  No indication for transfusion currently.  Typically, lower GI bleeds will resolve spontaneously, will if this worsens or if there is a drastic drop in hemoglobin, will call GI.  Pulled cortrak out, we need to replace the tube given this is his form of enteral access for meds.   4098 - critical Hgb 6.8, still have bright red blood per rectum; will transfuse 1u prbc  Intervention Category Intermediate Interventions: Bleeding - evaluation and treatment with blood products  Pietra Zuluaga 01/18/2023, 11:12 PM

## 2023-01-18 NOTE — Evaluation (Signed)
Physical Therapy Evaluation Patient Details Name: Travis [name] MRN: [id number] DOB: 17-Jul-1941 Today's Date: 01/18/2023  History of Present Illness  82 y.o. male admitted 5/3 with SOB, NSTEMI, acute on chronic CHF. 5/5 progressive AMS. PMHx: R hip fx s/p THA march 2024, HFrEF, ESRD on HD, GERD, HTN, CAD, anemia, arthritis, pulmonary fibrosis, sjogren's disease  Clinical Impression  Pt with flat affect, flexed neck and trunk who has essentially been performing stand pivot transfers only with family assist since THA. Family remain adamant on return home and having 24hr available assist but confirm hospital bed and hoyer would be helpful. Pt reports SOB however despite 4 different probes unable to get accurate pleth. Pt with decreased strength, transfers, mobility who will benefit from acute trial of therapy to maximize strength and function prior to discharge.          Recommendations for follow up therapy are one component of a multi-disciplinary discharge planning process, led by the attending physician.  Recommendations may be updated based on patient status, additional functional criteria and insurance authorization.  Follow Up Recommendations       Assistance Recommended at Discharge Frequent or constant Supervision/Assistance  Patient can return home with the following  A lot of help with walking and/or transfers;A lot of help with bathing/dressing/bathroom;Assistance with cooking/housework;Assist for transportation;Help with stairs or ramp for entrance    Equipment Recommendations Hospital bed;Other (comment) (hoyer lift)  Recommendations for Other Services       Functional Status Assessment Patient has had a recent decline in their functional status and/or demonstrates limited ability to make significant improvements in function in a reasonable and predictable amount of time     Precautions / Restrictions Precautions Precautions: Fall      Mobility  Bed Mobility Overal  bed mobility: Needs Assistance Bed Mobility: Supine to Sit     Supine to sit: Mod assist, HOB elevated     General bed mobility comments: HOB 35 degrees, mod assist to initiate LB movement toward EOB with hand over hand assist to semi roll trunk toward rail and rise from surface.    Transfers Overall transfer level: Needs assistance   Transfers: Sit to/from Stand, Bed to chair/wheelchair/BSC Sit to Stand: Mod assist, +2 physical assistance Stand pivot transfers: Mod assist, +2 physical assistance         General transfer comment: mod +2 assist with right knee blocked to rise from surface with cues for hand placement x 3 trials with pt inctontinent of stool and sitting on bed pan on 2nd trials. Max assist for pericare and pivot with RW bed to recliner    Ambulation/Gait               General Gait Details: unable  Stairs            Wheelchair Mobility    Modified Rankin (Stroke Patients Only)       Balance Overall balance assessment: Needs assistance Sitting-balance support: Feet supported Sitting balance-Leahy Scale: Fair     Standing balance support: Bilateral upper extremity supported, During functional activity, Reliant on assistive device for balance Standing balance-Leahy Scale: Poor Standing balance comment: standing with RW                             Pertinent Vitals/Pain Pain Assessment Pain Assessment: No/denies pain    Home Living Family/patient expects to be discharged to:: Private residence Living Arrangements: Spouse/significant other;Non-relatives/Friends Available Help at Discharge: Family;Available 24  hours/day;Personal care attendant Type of Home: House Home Access: Ramped entrance       Home Layout: One level;Laundry or work area in [employer] Equipment: Gilmer Mor - single point;Rollator (4 wheels);Other (comment);Rolling Walker (2 wheels);Toilet riser;Transport chair;BSC/3in1      Prior Function Prior Level of  Function : Needs assist             Mobility Comments: max assist for stand pivot transfers from bed to [facility] and transport chair, has only taken pivotal steps at home since THA in March ADLs Comments: aide performs sponge bathes, total assist for pericare. Family and aide perform IADLs     Hand Dominance        Extremity/Trunk Assessment   Upper Extremity Assessment Upper Extremity Assessment: Defer to OT evaluation    Lower Extremity Assessment Lower Extremity Assessment: Generalized weakness    Cervical / Trunk Assessment Cervical / Trunk Assessment: Kyphotic;Other exceptions Cervical / Trunk Exceptions: forward head with maintained neck flexion  Communication   Communication: No difficulties  Cognition Arousal/Alertness: Awake/alert Behavior During Therapy: Flat affect Overall Cognitive Status: Impaired/Different from baseline Area of Impairment: Orientation, Memory, Following commands, Safety/judgement                 Orientation Level: Disoriented to, Time   Memory: Decreased short-term memory Following Commands: Follows one step commands inconsistently, Follows one step commands with increased time Safety/Judgement: Decreased awareness of safety, Decreased awareness of deficits     General Comments: frequently stating desire not to live like this        General Comments      Exercises     Assessment/Plan    PT Assessment Patient needs continued PT services  PT Problem List Decreased strength;Decreased activity tolerance;Decreased balance;Decreased mobility;Decreased knowledge of use of DME       PT Treatment Interventions DME instruction;Balance training;Gait training;Functional mobility training;Therapeutic activities;Patient/family education;Therapeutic exercise    PT Goals (Current goals can be found in the Care Plan section)  Acute Rehab PT Goals Patient Stated Goal: return home PT Goal Formulation: With patient/family Time For Goal  Achievement: 02/01/23 Potential to Achieve Goals: Fair    Frequency Min 1X/week     Co-evaluation PT/OT/SLP Co-Evaluation/Treatment: Yes Reason for Co-Treatment: Complexity of the patient's impairments (multi-system involvement) PT goals addressed during session: Mobility/safety with mobility         AM-PAC PT "6 Clicks" Mobility  Outcome Measure Help needed turning from your back to your side while in a flat bed without using bedrails?: A Lot Help needed moving from lying on your back to sitting on the side of a flat bed without using bedrails?: A Lot Help needed moving to and from a bed to a chair (including a wheelchair)?: A Lot Help needed standing up from a chair using your arms (e.g., wheelchair or bedside chair)?: A Lot Help needed to walk in hospital room?: Total Help needed climbing 3-5 steps with a railing? : Total 6 Click Score: 10    End of Session Equipment Utilized During Treatment: Gait belt Activity Tolerance: Patient tolerated treatment well Patient left: in chair;with call bell/phone within reach;with chair alarm set;with family/visitor present Nurse Communication: Mobility status;Need for lift equipment (stedy) PT Visit Diagnosis: Other abnormalities of gait and mobility (R26.89);Muscle weakness (generalized) (M62.81)    Time: 1610-9604 PT Time Calculation (min) (ACUTE ONLY): 26 min   Charges:   PT Evaluation $PT Eval Moderate Complexity: 1 Mod          Ashvin Adelson P,  PT Acute Rehabilitation Services Office: [phone]   Cristine Polio 01/18/2023, 12:26 PM

## 2023-01-18 NOTE — Progress Notes (Addendum)
   NAME:  Travis [name], MRN:  [id number], DOB:  [birth year], LOS: 4 ADMISSION DATE:  02/07/2023, CONSULTATION DATE:  01/16/23 REFERRING MD:  Maryfrances Bunnell - TRH , CHIEF COMPLAINT:  SOB   History of Present Illness:  82 yo M PMH ESRD, HFrEF, HTN, mvCAD, presented to ED 5/3 with SOB. Admitted to [facility] for management of NSTEMI, Acute on Chronic HF.  On 5/5 the pt had a decline in mental status,  labs revealing incr trops, markedly elevated BNP, LA > 9, and elevated LFTs   Cardiology is following  PCCM is consulted for ICU transfer in this setting  Pertinent  Medical History  ESRD mvCAD HFrEF  Significant Hospital Events: Including procedures, antibiotic start and stop dates in addition to other pertinent events   5/3 admitted overnight. NSTEMI, medical mgmnt 5/4 Dialysis 5/5 AMS, leukocytosis, LA> 9 incr LFTs trops BNP. PCCM consult ICU transfer   Interim History / Subjective:  Mentation improved. Wants to eat.  Objective   Blood pressure 112/76, pulse 97, temperature 97.6 F (36.4 C), temperature source Oral, resp. rate (!) 30, height 5\' 10"  (1.778 m), weight 69.7 kg, SpO2 100 %. CVP:  [6 mmHg-16 mmHg] 16 mmHg      Intake/Output Summary (Last 24 hours) at 01/18/2023 0842 Last data filed at 01/18/2023 0800 Gross per 24 hour  Intake 851.19 ml  Output 1800 ml  Net -948.81 ml    Filed Weights   01/17/23 0600 01/17/23 0835 01/18/23 0500  Weight: 71 kg 69.7 kg 69.7 kg    Examination: No distress MM dry, trachea midline Ext with muscle wasting Lungs with scattered rhonci clears with cough Globally weak AOx 3  Trop bump and echo noted [place] okay  Resolved Hospital Problem list     Assessment & Plan:   Septic encephalopathy- improved, some lingering delirium NSTEMI- will have cardiology eval Cardiomyopathy- septic/stress vs. 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  Appreciate TRH taking over 01/19/23  Wife [name] updated by phone, she requests calls if any change in patient condition.  Best Practice (right click and "Reselect all SmartList Selections" daily)   Diet/type: pending SLP DVT prophylaxis: systemic heparin GI prophylaxis: N/A and PPI Lines: N/A Foley:  N/A Code Status:  full code Last date of multidisciplinary goals of care discussion [updated patient, called daughter VM, called BIL [name] and gave update]  Myrla Halsted MD PCCM ,

## 2023-01-19 ENCOUNTER — Encounter (HOSPITAL_COMMUNITY): Payer: Self-pay | Admitting: Internal Medicine

## 2023-01-19 ENCOUNTER — Inpatient Hospital Stay (HOSPITAL_COMMUNITY): Payer: No Typology Code available for payment source

## 2023-01-19 DIAGNOSIS — I214 Non-ST elevation (NSTEMI) myocardial infarction: Secondary | ICD-10-CM | POA: Diagnosis not present

## 2023-01-19 DIAGNOSIS — E440 Moderate protein-calorie malnutrition: Secondary | ICD-10-CM | POA: Insufficient documentation

## 2023-01-19 DIAGNOSIS — J9601 Acute respiratory failure with hypoxia: Secondary | ICD-10-CM | POA: Diagnosis not present

## 2023-01-19 DIAGNOSIS — I5043 Acute on chronic combined systolic (congestive) and diastolic (congestive) heart failure: Secondary | ICD-10-CM | POA: Diagnosis not present

## 2023-01-19 DIAGNOSIS — I2510 Atherosclerotic heart disease of native coronary artery without angina pectoris: Secondary | ICD-10-CM | POA: Diagnosis not present

## 2023-01-19 LAB — GLUCOSE, CAPILLARY
Glucose-Capillary: 13 mg/dL — CL (ref 70–99)
Glucose-Capillary: 136 mg/dL — ABNORMAL HIGH (ref 70–99)
Glucose-Capillary: 144 mg/dL — ABNORMAL HIGH (ref 70–99)
Glucose-Capillary: 210 mg/dL — ABNORMAL HIGH (ref 70–99)
Glucose-Capillary: 35 mg/dL — CL (ref 70–99)
Glucose-Capillary: 70 mg/dL (ref 70–99)
Glucose-Capillary: 94 mg/dL (ref 70–99)
Glucose-Capillary: 99 mg/dL (ref 70–99)
Glucose-Capillary: <10 mg/dL — CL (ref 70–99)

## 2023-01-19 LAB — CBC
HCT: 21.9 % — ABNORMAL LOW (ref 39.0–52.0)
HCT: 27.2 % — ABNORMAL LOW (ref 39.0–52.0)
Hemoglobin: 6.8 g/dL — CL (ref 13.0–17.0)
Hemoglobin: 9.2 g/dL — ABNORMAL LOW (ref 13.0–17.0)
MCH: 31.2 pg (ref 26.0–34.0)
MCH: 31.1 pg (ref 26.0–34.0)
MCHC: 31.1 g/dL (ref 30.0–36.0)
MCHC: 33.8 g/dL (ref 30.0–36.0)
MCV: 100.5 fL — ABNORMAL HIGH (ref 80.0–100.0)
MCV: 91.9 fL (ref 80.0–100.0)
Platelets: 202 10*3/uL (ref 150–400)
Platelets: 150 10*3/uL (ref 150–400)
RBC: 2.18 MIL/uL — ABNORMAL LOW (ref 4.22–5.81)
RBC: 2.96 MIL/uL — ABNORMAL LOW (ref 4.22–5.81)
RDW: 21.4 % — ABNORMAL HIGH (ref 11.5–15.5)
RDW: 19.4 % — ABNORMAL HIGH (ref 11.5–15.5)
WBC: 16.4 10*3/uL — ABNORMAL HIGH (ref 4.0–10.5)
WBC: 14.9 10*3/uL — ABNORMAL HIGH (ref 4.0–10.5)
nRBC: 21.2 % — ABNORMAL HIGH (ref 0.0–0.2)
nRBC: 26 % — ABNORMAL HIGH (ref 0.0–0.2)

## 2023-01-19 LAB — BASIC METABOLIC PANEL
Anion gap: 13 (ref 5–15)
BUN: 36 mg/dL — ABNORMAL HIGH (ref 8–23)
CO2: 26 mmol/L (ref 22–32)
Calcium: 7.2 mg/dL — ABNORMAL LOW (ref 8.9–10.3)
Chloride: 99 mmol/L (ref 98–111)
Creatinine, Ser: 4.23 mg/dL — ABNORMAL HIGH (ref 0.61–1.24)
GFR, Estimated: 13 mL/min — ABNORMAL LOW (ref >60)
Glucose, Bld: 192 mg/dL — ABNORMAL HIGH (ref 70–99)
Potassium: 3.4 mmol/L — ABNORMAL LOW (ref 3.5–5.1)
Sodium: 138 mmol/L (ref 135–145)

## 2023-01-19 LAB — BPAM RBC
Blood Product Expiration Date: 202406022359
ISSUE DATE / TIME: 202405080628
Unit Type and Rh: 6200

## 2023-01-19 LAB — COMPREHENSIVE METABOLIC PANEL
ALT: 346 U/L — ABNORMAL HIGH (ref 0–44)
AST: 246 U/L — ABNORMAL HIGH (ref 15–41)
Albumin: 2.1 g/dL — ABNORMAL LOW (ref 3.5–5.0)
Alkaline Phosphatase: 81 U/L (ref 38–126)
Anion gap: 14 (ref 5–15)
BUN: 68 mg/dL — ABNORMAL HIGH (ref 8–23)
CO2: 22 mmol/L (ref 22–32)
Calcium: 6.9 mg/dL — ABNORMAL LOW (ref 8.9–10.3)
Chloride: 98 mmol/L (ref 98–111)
Creatinine, Ser: 6.89 mg/dL — ABNORMAL HIGH (ref 0.61–1.24)
GFR, Estimated: 7 mL/min — ABNORMAL LOW (ref >60)
Glucose, Bld: 112 mg/dL — ABNORMAL HIGH (ref 70–99)
Potassium: 4.3 mmol/L (ref 3.5–5.1)
Sodium: 134 mmol/L — ABNORMAL LOW (ref 135–145)
Total Bilirubin: 1.2 mg/dL (ref 0.3–1.2)
Total Protein: 5.8 g/dL — ABNORMAL LOW (ref 6.5–8.1)

## 2023-01-19 LAB — HEMOGLOBIN AND HEMATOCRIT, BLOOD
HCT: 25.3 % — ABNORMAL LOW (ref 39.0–52.0)
Hemoglobin: 7.9 g/dL — ABNORMAL LOW (ref 13.0–17.0)

## 2023-01-19 LAB — PREPARE RBC (CROSSMATCH)

## 2023-01-19 LAB — HEMOGLOBIN A1C
Hgb A1c MFr Bld: 4.7 % — ABNORMAL LOW (ref 4.8–5.6)
Mean Plasma Glucose: 88.19 mg/dL

## 2023-01-19 LAB — HEPARIN LEVEL (UNFRACTIONATED): Heparin Unfractionated: <0.1 IU/mL — ABNORMAL LOW (ref 0.30–0.70)

## 2023-01-19 MED ORDER — SODIUM CHLORIDE 0.9% IV SOLUTION: Freq: Once | INTRAVENOUS | Status: DC

## 2023-01-19 MED ORDER — IOHEXOL 350 MG/ML SOLN
100.0000 mL | Freq: Once | INTRAVENOUS | Status: AC | PRN
  Administered 2023-01-19: 100 mL via INTRAVENOUS

## 2023-01-19 MED ORDER — DEXTROSE 50 % IV SOLN
INTRAVENOUS | Status: AC
  Administered 2023-01-19: 50 mL
  Filled 2023-01-19: qty 50

## 2023-01-19 MED ORDER — DEXTROSE 10 % IV SOLN: INTRAVENOUS | Status: DC

## 2023-01-19 MED ORDER — SODIUM CHLORIDE 0.9 % IV SOLN: INTRAVENOUS | Status: DC | PRN

## 2023-01-19 MED ORDER — DEXTROSE 50 % IV SOLN
INTRAVENOUS | Status: AC
  Filled 2023-01-19: qty 50

## 2023-01-19 NOTE — Progress Notes (Signed)
   01/19/23 2303  Provider Notification  Provider Name/Title Laban Emperor MD  Date Provider Notified 01/19/23  Time Provider Notified 2303  Method of Notification Page  Notification Reason Other (Comment) (NG out, MEWS yellow continues)

## 2023-01-19 NOTE — Progress Notes (Signed)
Dr. Jomarie Longs contacted to report large amount of bright red and dark blood with large clots from rectum and CBG result of 13 when patient returned from HD at 1225.  MD came to bedside to assess patient and enter orders.  Rapid Response RN also called to assess patient.

## 2023-01-19 NOTE — Progress Notes (Signed)
[facility] Progress Note   Subjective:   Respirations unlabored while sleeping, became anxious and tachypneic when he woke up, was concerned that he woke up with no teeth. Denies SOB, CP, dizziness, abdominal pain and nausea. Patient reassured, calmed down and went back to sleep.   Objective Vitals:   01/19/23 0345 01/19/23 0619 01/19/23 0634 01/19/23 0656  BP: 116/72 115/67 115/67 123/72  Pulse: 97 93 93 92  Resp: (!) 22  (!) 22 (!) 23  Temp:  (!) 97.4 F (36.3 C) (!) 97.4 F (36.3 C) 98.1 F (36.7 C)  TempSrc:  Axillary Axillary Axillary  SpO2: 100% 100% 100% 100%  Weight:      Height:       Physical Exam General: Alert male, initially anxious appearing Heart: RRR, 2/6 systolic murmur Lungs: CTA bilaterally but diminished in bases, poor cooperation with exam, on O2 via [place] Abdomen: Soft, non-distended, +BS Extremities: No edema b/l lower extremities Dialysis Access:  LUE AVF + t/b  Additional Objective Labs: Basic Metabolic Panel: Recent Labs  Lab 01/17/23 0423 01/17/23 1003 01/17/23 1841 01/18/23 0440 01/18/23 1700 01/19/23 0517  NA 132*  --   --  131*  --  134*  K 4.3  --   --  3.5  --  4.3  CL 92*  --   --  92*  --  98  CO2 22  --   --  23  --  22  GLUCOSE 89  --   --  128*  --  112*  BUN 66*  --   --  43*  --  68*  CREATININE 7.34*  --   --  5.14*  --  6.89*  CALCIUM 7.5*  --   --  7.5*  --  6.9*  PHOS  --    < > 4.0 4.3 3.8  --    < > = values in this interval not displayed.   Liver Function Tests: Recent Labs  Lab 01/16/23 0928 01/18/23 0440 01/19/23 0517  AST 492* 356* 246*  ALT 403* 453* 346*  ALKPHOS 86 92 81  BILITOT 1.3* 1.3* 1.2  PROT 7.4 6.8 5.8*  ALBUMIN 2.8* 2.5* 2.1*   Recent Labs  Lab 01/24/2023 1841 01/16/23 1039  LIPASE 32 40   CBC: Recent Labs  Lab 01/20/2023 1841 01/15/23 0835 01/16/23 0345 01/16/23 1636 01/17/23 0423 01/18/23 0440 01/18/23 2317 01/19/23 0517  WBC 15.1*   < > 24.9* 24.1* 18.1* 14.6*   --  16.4*  NEUTROABS 12.2*  --   --   --   --   --   --   --   HGB 8.0*   < > 8.7* 7.2* 8.6* 8.7* 7.9* 6.8*  HCT 26.2*   < > 27.7* 24.4* 26.9* 27.8* 25.3* 21.9*  MCV 99.6   < > 96.9 103.0* 96.8 99.3  --  100.5*  PLT 191   < > 192 205 196 186  --  202   < > = values in this interval not displayed.   Blood Culture    Component Value Date/Time   SDES BLOOD LEFT HAND 01/16/2023 0948   SPECREQUEST  01/16/2023 0948    BOTTLES DRAWN AEROBIC AND ANAEROBIC Blood Culture adequate volume   CULT  01/16/2023 0948    NO GROWTH 2 DAYS Performed at [facility], [address] [place], [place] [address]    REPTSTATUS PENDING 01/16/2023 [phone]    Cardiac Enzymes: No results for input(s): "CKTOTAL", "  CKMB", "CKMBINDEX", "TROPONINI" in the last 168 hours. CBG: Recent Labs  Lab 01/18/23 1610 01/18/23 1807 01/18/23 2006 01/19/23 0001 01/19/23 0438  GLUCAP 67* 78 102* 94 99   Iron Studies: No results for input(s): "IRON", "TIBC", "TRANSFERRIN", "FERRITIN" in the last 72 hours. @lablastinr3 @ Studies/Results: US Abdomen Limited RUQ (LIVER/GB)  Result Date: 01/17/2023 CLINICAL DATA:  Abdominal pain. EXAM: ULTRASOUND ABDOMEN LIMITED RIGHT UPPER QUADRANT COMPARISON:  CT abdomen pelvis 01/16/2023 FINDINGS: Gallbladder: There are shadowing gallstones measuring up to 1.2 cm. No gallbladder wall thickening. No sonographic Murphy sign noted by sonographer. Common bile duct: Diameter: 0.2 cm, within normal limits Liver: No focal lesion identified. Within normal limits in parenchymal echogenicity. Portal vein is patent on color Doppler imaging with normal direction of blood flow towards the liver. Other: Echogenic right kidney. IMPRESSION: 1. Cholelithiasis without sonographic evidence of acute cholecystitis. 2. Echogenic right kidney which is nonspecific but can be seen in the setting of medical renal disease. Electronically Signed   By: Emmaline Kluver M.D.   On: 01/17/2023 17:17   CT CHEST WO  CONTRAST  Result Date: 01/17/2023 CLINICAL DATA:  Suspected sepsis for evaluation of pneumonia EXAM: CT CHEST WITHOUT CONTRAST TECHNIQUE: Multidetector CT imaging of the chest was performed following the standard protocol without IV contrast. RADIATION DOSE REDUCTION: This exam was performed according to the departmental dose-optimization program which includes automated exposure control, adjustment of the mA and/or kV according to patient size and/or use of iterative reconstruction technique. COMPARISON:  CT chest dated 09/19/2022 FINDINGS: Cardiovascular: Left IJ central venous catheter tip terminates in the lower SVC. Right brachiocephalic vein stent in-situ. Increased multichamber cardiomegaly. No significant pericardial fluid/thickening. Similar dilated pulmonary artery measures 3.6 cm. Extensive coronary artery calcifications. Aortic atherosclerosis. Mediastinum/Nodes: Imaged thyroid gland without nodules meeting criteria for imaging follow-up by size. Normal esophagus. Similar multi station lymphadenopathy, for example pretracheal measures 10 mm (3:49) and left supraclavicular measures 111 mm (3:18). Lungs/Pleura: The central airways are patent. Trace layering secretions within the trachea. Similar lower lobe predominant diffuse bronchial wall thickening. Mild interlobular septal thickening is again seen. Multifocal tree-in-bud and ground-glass nodules within the dependent upper lobes are new. Increased bilateral lower lobe, left-greater-than-right consolidative opacities and irregular nodularity. A few scattered cysts are unchanged. No pneumothorax. Similar small bilateral pleural effusions. Unchanged simple fluid attenuation loculated component along the minor fissure and posterior right upper lung. Additional areas of loculated pleural effusion along the left lateral upper lung and major fissure have decreased or resolved. Upper abdomen: Enteric tube reaches the stomach terminates below the field of view.  Innumerable bilateral renal cysts are better evaluated on prior CT abdomen and pelvis. Musculoskeletal: No acute or abnormal lytic or blastic osseous lesions. Similar bilateral gynecomastia. IMPRESSION: 1. Increased bilateral lower lobe, left-greater-than-right consolidative opacities and irregular nodularity with new multifocal tree-in-bud and ground-glass nodules within the dependent upper lobes. Findings are suspicious for multifocal pneumonia. 2. Similar small bilateral pleural effusions with simple fluid attenuation loculated component along the minor fissure and posterior right upper lung. Additional areas of loculated pleural effusion along the left lateral upper lung and major fissure have decreased or resolved. 3. Similar multi station lymphadenopathy, likely reactive. 4. Increased multichamber cardiomegaly. 5. Similar dilated pulmonary artery, which can be seen in the setting of pulmonary hypertension. 6. Aortic Atherosclerosis (ICD10-I70.0). Coronary artery calcifications. Assessment for potential risk factor modification, dietary therapy or pharmacologic therapy may be warranted, if clinically indicated. Electronically Signed   By: Agustin Cree M.D.   On:  01/17/2023 15:37   DG Abd Portable 1V  Result Date: 01/17/2023 CLINICAL DATA:  Feeding tube placement. EXAM: PORTABLE ABDOMEN - 1 VIEW COMPARISON:  KUB 07/31/2015, CT abdomen/pelvis 01/16/2023. FINDINGS: The enteric catheter tip projects over the expected location of the distal stomach. There is a nonobstructive bowel gas pattern. Enteric contrast is noted in the large bowel. An IVC filter is noted. There is no definite free intraperitoneal air, within the confines of supine technique. A left internal jugular venous catheter projects over the mid SVC. The cardiomediastinal silhouette is stable. There is a right pleural effusion with ovoid opacity projecting over the right midlung, unchanged unchanged and again likely reflecting fissural fluid. A small  left pleural effusion is not significantly changed. IMPRESSION: Enteric catheter tip in the expected location of the distal stomach. Electronically Signed   By: Lesia Hausen M.D.   On: 01/17/2023 14:27   Medications:  sodium chloride     albumin human     feeding supplement (VITAL 1.5 CAL) 1,000 mL (01/18/23 1723)   heparin Stopped (01/18/23 2228)   piperacillin-tazobactam (ZOSYN)  IV 2.25 g (01/19/23 0241)    sodium chloride   Intravenous Once   sodium chloride   Intravenous Once   aspirin  81 mg Per Tube Daily   atorvastatin  40 mg Per Tube Daily   calcitRIOL  3.3 mcg Oral Q M,W,F-HD   Chlorhexidine Gluconate Cloth  6 each Topical Q0600   cinacalcet  90 mg Oral Q M,W,F-HD   feeding supplement (PROSource TF20)  60 mL Per Tube Daily   midodrine  10 mg Oral Q M,W,F   multivitamin  1 tablet Per Tube QHS   pantoprazole (PROTONIX) IV  40 mg Intravenous Q12H   sevelamer carbonate  1,600 mg Per Tube TID WC   sodium chloride flush  3 mL Intravenous Q12H    OP Dialysis Orders: [facility] MWF 3.5h  400/1.5   67.4kg   2/2 bath  LFA AVG  Heparin none - last HD 5/03, post wt 66.7kg - rocaltrol 2.5 mcg po tiw - sensipar 150mg  po tiw - mircera 200 mcg IV q 2wks, last 4/24, due 5/08  Assessment/Plan: Leukocytosis-Trending down overall, on IV abx per admitting team Acute hypoxic resp failure - w/ pulm edema on CXR and also ^trops c/w possible NSTEMI. Went for HD 5/6 w/ 2L off. Possibly with flash pulm edema from nstemi.  Difficult to assess SOB today, anxiety and worsening anemia likely contributing. O2 sat 100% during my exam.   NSTEMI, cardiology consulting. 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 Had usual HD yesterday at his OP unit. Then was admitted and had another HD overnight as above. HD 01/15/2023 w/ 2L net uf, 5/6 1.8L .    Plan on HD Wed with UPF2. FTT concerning and at current time RRT not improving QOL.    HTN/ volume - needs good standing wt or good bed weight. No edema on exam this am. BP's are wnl.   Anemia esrd - Hb down to 6.8 this AM with heparin re-challenge. PRBC ordered, heparin is on hold.  MBD ckd - CCa and phos are in range. Cont renvela as binder w/ sensipar and po vdra.  HFrEF -Repeat echo 01/15/23 25-30%.  H/o DVT - sp IVC filter. Takes eliquis at home, IV heparin gtt off due to concern of bleeding H/o hypotension - takes midodrine pre HD on mwf.     Rogers Blocker, PA-C 01/19/2023, 7:09 AM  [facility] Pager: [phone]

## 2023-01-19 NOTE — Progress Notes (Signed)
Pt removed core trak & had several episodes of bright red bloody bms. Heparin gtt stopped. 1Uprbc infusing. Tolerating ice chips.

## 2023-01-19 NOTE — Progress Notes (Signed)
Palliative-   Consult received, chart reviewed.   Plan to meet with family tomorrow at 10am.   Ocie Bob, AGNP-C Palliative Medicine  No charge

## 2023-01-19 NOTE — Significant Event (Signed)
Rapid Response Event Note   Reason for Call :  Hypoglycemia, GIB  Initial Focused Assessment:  Called about pt with CBG of 13 and large amounts of rectal bleeding. On arrival, pt being cleaning up in bed. He was awake and speaking with staff with some confusion noted. Large amount of dark stool with blood clots noted.   HR 95, SBP 120, RR 23, spO2 100%. CBG rechecked and read "LO". Blood drawn from CVC to check for accuracy (since pt received D50 prior) which resulted 210.    Interventions:  D50 given PTA CBG Safe set primed and connected to CVC.  Labs- CBC, BMP CTA abd ordered  Plan of Care:  Continue to monitor for bleeding. Use safe set for CBG since finger stick not accurate. RN instructed to call with any changes or concerns.    Event Summary:   MD Notified: Dr Jomarie Longs notified and to bedside on my arrival Call Time: 1240 Arrival Time: 1245 End Time: 1315  Mordecai Rasmussen, RN

## 2023-01-19 NOTE — Progress Notes (Signed)
Nutrition Brief Note  Cortrak pulled yesterday. Replaced today. Tip in distal stomach.   Pt noted to have nausea with TF advancement yesterday and TF rate was decreased from 55ml/hr Vital 1.5 down to 59ml/hr of Vital 1.5 prior to Cortrak being pulled.   Reached out to MD regarding re-starting TF. Plans to hold on TF today given active GIB and re-assess in the coming days.   Will continue to follow up as appropriate. Please reach out in the meantime if additional nutrition related needs/concerns arise.   Drusilla Kanner, RDN, LDN Clinical Nutrition

## 2023-01-19 NOTE — Progress Notes (Signed)
SLP Cancellation Note  Patient Details Name: Blessing Personius MRN: [id number] DOB: 1941/03/03   Cancelled treatment:        Attempted to see pt for ongoing swallowing re-evaluation.  Pt off floor for HD at time of attempt.  SLP will reattempt as schedule permits.                                                                                                Kerrie Pleasure, MA, CCC-SLP Acute Rehabilitation Services Office: [phone] 01/19/2023, 11:08 AM

## 2023-01-19 NOTE — Progress Notes (Addendum)
PROGRESS NOTE    Travis [name]  ZOX:[id number] DOB: 09/14/1940 DOA: 02/05/2023 PCP: [facility]  82/M with ESRD on hemodialysis, chronic systolic CHF, multivessel CAD, history of DVT and IVC filter presented to the ED 5/3 with shortness of breath, admitted for NSTEMI and acute on chronic systolic CHF -5/4 treated with dialysis -5/5 had worsening encephalopathy, leukocytosis and lactic acidosis, concern for severe sepsis, transferred to ICU, further workup noted multifocal pneumonia and pleural effusions, treated with IV Zosyn -Hospitalization complicated by dysphagia, core track placed -Also intermittent lower GI bleed in ICU -5/7 night had 2-3 episodes of lower GI bleed, hemoglobin down to 6.8, transfused 1 unit PRBC -5/8, transferred from ICU/PCCM to [facility] service, pulled out core track this morning, 3 episodes of large volume hematochezia   Subjective: -Patient seen on dialysis, hurts all over, hungry wants to eat, pulled out core track  Assessment and Plan:  Acute on chronic combined systolic and diastolic CHF -Echo with EF 25%, low normal RV, moderate mitral regurgitation, severe TR -Poor prognosis -Volume management with HD -Continue midodrine on dialysis days  NSTEMI Known multivessel CAD -On medical management, echo with wall motion abnormalities, troponin peaked at 1700 -Cards following, IV heparin held for GI bleed, cath deferred in the setting of severe sepsis -Continue aspirin, Lipitor  Lower GI bleed Acute blood loss anemia -Intermittent episodes noted in ICU, CT abdomen largely unremarkable, colonic diverticulosis noted -IV heparin held temporarily and then was resumed, will request gastroenterology consult, will obtain NM scan if recurs  Severe sepsis, shock Multifocal/aspiration pneumonia Lactic acidosis -Multifocal pneumonia noted on CT -Blood cultures negative -On IV Zosyn day 2 -Dysphagia noted on swallow eval  Dysphagia -Has severe  dysphagia, SLP eval completed, has pharyngeal dysphagia, alternate means of nutrition recommended -Patient had core track, with tube feeds, now pulled out this morning -Will request core track replacement, RD consult  End stage renal disease on dialysis (HCC) - On hemodialysis, nephrology following, HD today  Acute metabolic encephalopathy -In the setting of sepsis, shock etc.  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  Not currently on disease active medication  ETHICs: 82/M, chronically ill with ESRD, heart failure, multivessel CAD, encephalopathy, dysphagia, admitted with NSTEMI, CHF complicated by aspiration pneumonia and severe dysphagia, poor prognosis discussed with family by ICU team and myself, they wanted full aggressive care  DVT prophylaxis: SCDs Code Status: Full code Family Communication: No family at bedside, called and updated daughter Disposition Plan: To be determined  Consultants:    Procedures:   Antimicrobials:    Objective: Vitals:   01/19/23 1004 01/19/23 1020 01/19/23 1037 01/19/23 1052  BP: 97/69 (!) 77/57 94/65 107/70  Pulse: 93 95 (!) 41 63  Resp: (!) 23 (!) 22 17 14   Temp:      TempSrc:      SpO2: 100%  (!) 86% (!) 50%  Weight:      Height:        Intake/Output Summary (Last 24 hours) at 01/19/2023 1106 Last data filed at 01/19/2023 0949 Gross per 24 hour  Intake 1033.71 ml  Output --  Net 1033.71 ml   Filed Weights   01/18/23 1605 01/19/23 0000 01/19/23 0815  Weight: 65.4 kg 63.9 kg 63.5 kg    Examination:  General exam: Chronically ill elderly male seen  on dialysis, awake alert oriented to self and partly to place only HEENT: Positive JVD CVS: S1-S2, regular rhythm Lungs: Bilateral rhonchi and few basilar rales Abdomen: Soft, nontender,  bowel sounds present Extremities: No edema Skin: No rashes Psychiatry: Flat affect, poor insight and judgment    Data Reviewed:   CBC: Recent Labs  Lab 01/23/2023 1841 01/15/23 0835 01/16/23 0345 01/16/23 1636 01/17/23 0423 01/18/23 0440 01/18/23 2317 01/19/23 0517  WBC 15.1*   < > 24.9* 24.1* 18.1* 14.6*  --  16.4*  NEUTROABS 12.2*  --   --   --   --   --   --   --   HGB 8.0*   < > 8.7* 7.2* 8.6* 8.7* 7.9* 6.8*  HCT 26.2*   < > 27.7* 24.4* 26.9* 27.8* 25.3* 21.9*  MCV 99.6   < > 96.9 103.0* 96.8 99.3  --  100.5*  PLT 191   < > 192 205 196 186  --  202   < > = values in this interval not displayed.   Basic Metabolic Panel: Recent Labs  Lab 01/15/23 0835 01/16/23 0345 01/16/23 0928 01/17/23 0423 01/17/23 1003 01/17/23 1841 01/18/23 0440 01/18/23 1700 01/19/23 0517  NA 135 133* 136 132*  --   --  131*  --  134*  K 3.8 4.5 5.1 4.3  --   --  3.5  --  4.3  CL 90* 92* 91* 92*  --   --  92*  --  98  CO2 26 21* 16* 22  --   --  23  --  22  GLUCOSE 99 109* 144* 89  --   --  128*  --  112*  BUN 20 41* 47* 66*  --   --  43*  --  68*  CREATININE 4.55* 6.32* 6.95* 7.34*  --   --  5.14*  --  6.89*  CALCIUM 8.5* 8.3* 8.3* 7.5*  --   --  7.5*  --  6.9*  MG 2.1  --   --   --  2.0 1.9 2.0 2.2  --   PHOS 3.6  --   --   --  3.3 4.0 4.3 3.8  --    GFR: Estimated Creatinine Clearance: 7.6 mL/min (A) (by C-G formula based on SCr of 6.89 mg/dL (H)). Liver Function Tests: Recent Labs  Lab 01/15/23 0835 01/16/23 0928 01/18/23 0440 01/19/23 0517  AST  --  492* 356* 246*  ALT  --  403* 453* 346*  ALKPHOS  --  86 92 81  BILITOT  --  1.3* 1.3* 1.2  PROT  --  7.4 6.8 5.8*  ALBUMIN 3.0* 2.8* 2.5* 2.1*   Recent Labs  Lab 01/12/2023 1841 01/16/23 1039  LIPASE 32 40   Recent Labs  Lab 01/16/23 1358  AMMONIA 26   Coagulation Profile: Recent Labs  Lab 01/16/23 0928  INR 1.9*   Cardiac Enzymes: No results for input(s): "CKTOTAL", "CKMB", "CKMBINDEX", "TROPONINI" in the last  168 hours. BNP (last 3 results) No results for input(s): "PROBNP" in the last 8760 hours. HbA1C: Recent Labs    01/18/23 2317  HGBA1C 4.7*   CBG: Recent Labs  Lab 01/18/23 1807 01/18/23 2006 01/19/23 0001 01/19/23 0438 01/19/23 0720  GLUCAP 78 102* 94 99 70   Lipid Profile: No results for input(s): "CHOL", "HDL", "LDLCALC", "TRIG", "CHOLHDL", "LDLDIRECT" in the last 72 hours. Thyroid Function Tests: No results for input(s): "TSH", "T4TOTAL", "FREET4", "T3FREE", "THYROIDAB" in the last  72 hours. Anemia Panel: No results for input(s): "VITAMINB12", "FOLATE", "FERRITIN", "TIBC", "IRON", "RETICCTPCT" in the last 72 hours. Urine analysis:    Component Value Date/Time   COLORURINE AMBER (A) 01/29/2015 2200   APPEARANCEUR CLOUDY (A) 01/29/2015 2200   LABSPEC 1.013 01/29/2015 2200   PHURINE 5.0 01/29/2015 2200   GLUCOSEU NEGATIVE 01/29/2015 2200   HGBUR LARGE (A) 01/29/2015 2200   BILIRUBINUR NEGATIVE 01/29/2015 2200   KETONESUR NEGATIVE 01/29/2015 2200   PROTEINUR 100 (A) 01/29/2015 2200   UROBILINOGEN 0.2 01/29/2015 2200   NITRITE NEGATIVE 01/29/2015 2200   LEUKOCYTESUR NEGATIVE 01/29/2015 2200   Sepsis Labs: @LABRCNTIP (procalcitonin:4,lacticidven:4)  ) Recent Results (from the past 240 hour(s))  SARS Coronavirus 2 by RT PCR (hospital order, performed in [facility] hospital lab) *cepheid single result test* Anterior Nasal Swab     Status: None   Collection Time: 02/01/2023  6:23 PM   Specimen: Anterior Nasal Swab  Result Value Ref Range Status   SARS Coronavirus 2 by RT PCR NEGATIVE NEGATIVE Final    Comment: Performed at [facility], [address], [place], [place] [address]  Culture, blood (Routine X 2) w Reflex to ID Panel     Status: None (Preliminary result)   Collection Time: 01/16/23  9:30 AM   Specimen: BLOOD LEFT HAND  Result Value Ref Range Status   Specimen Description BLOOD LEFT HAND  Final   Special Requests   Final    BOTTLES DRAWN AEROBIC AND  ANAEROBIC Blood Culture results may not be optimal due to an inadequate volume of blood received in culture bottles   Culture   Final    NO GROWTH 3 DAYS Performed at [facility], [address], [place], [place] [address]    Report Status PENDING  Incomplete  Culture, blood (Routine X 2) w Reflex to ID Panel     Status: None (Preliminary result)   Collection Time: 01/16/23  9:48 AM   Specimen: BLOOD LEFT HAND  Result Value Ref Range Status   Specimen Description BLOOD LEFT HAND  Final   Special Requests   Final    BOTTLES DRAWN AEROBIC AND ANAEROBIC Blood Culture adequate volume   Culture   Final    NO GROWTH 3 DAYS Performed at [facility], [address], [place], [place] [address]    Report Status PENDING  Incomplete  MRSA Next Gen by PCR, Nasal     Status: None   Collection Time: 01/16/23  2:03 PM   Specimen: Nasal Mucosa; Nasal Swab  Result Value Ref Range Status   MRSA by PCR Next Gen NOT DETECTED NOT DETECTED Final    Comment: (NOTE) The GeneXpert MRSA Assay (FDA approved for NASAL specimens only), is one component of a comprehensive MRSA colonization surveillance program. It is not intended to diagnose MRSA infection nor to guide or monitor treatment for MRSA infections. Test performance is not FDA approved in patients less than 36 years old. Performed at [facility], [address] [place], [place] [address]      Radiology Studies: US Abdomen Limited RUQ (LIVER/GB)  Result Date: 01/17/2023 CLINICAL DATA:  Abdominal pain. EXAM: ULTRASOUND ABDOMEN LIMITED RIGHT UPPER QUADRANT COMPARISON:  CT abdomen pelvis 01/16/2023 FINDINGS: Gallbladder: There are shadowing gallstones measuring up to 1.2 cm. No gallbladder wall thickening. No sonographic Murphy sign noted by sonographer. Common bile duct: Diameter: 0.2 cm, within normal limits Liver: No focal lesion identified. Within normal limits in parenchymal echogenicity.  Portal vein is patent on color Doppler  imaging with normal direction of blood flow towards the liver. Other: Echogenic right kidney. IMPRESSION: 1. Cholelithiasis without sonographic evidence of acute cholecystitis. 2. Echogenic right kidney which is nonspecific but can be seen in the setting of medical renal disease. Electronically Signed   By: Emmaline Kluver M.D.   On: 01/17/2023 17:17   CT CHEST WO CONTRAST  Result Date: 01/17/2023 CLINICAL DATA:  Suspected sepsis for evaluation of pneumonia EXAM: CT CHEST WITHOUT CONTRAST TECHNIQUE: Multidetector CT imaging of the chest was performed following the standard protocol without IV contrast. RADIATION DOSE REDUCTION: This exam was performed according to the departmental dose-optimization program which includes automated exposure control, adjustment of the mA and/or kV according to patient size and/or use of iterative reconstruction technique. COMPARISON:  CT chest dated 09/19/2022 FINDINGS: Cardiovascular: Left IJ central venous catheter tip terminates in the lower SVC. Right brachiocephalic vein stent in-situ. Increased multichamber cardiomegaly. No significant pericardial fluid/thickening. Similar dilated pulmonary artery measures 3.6 cm. Extensive coronary artery calcifications. Aortic atherosclerosis. Mediastinum/Nodes: Imaged thyroid gland without nodules meeting criteria for imaging follow-up by size. Normal esophagus. Similar multi station lymphadenopathy, for example pretracheal measures 10 mm (3:49) and left supraclavicular measures 111 mm (3:18). Lungs/Pleura: The central airways are patent. Trace layering secretions within the trachea. Similar lower lobe predominant diffuse bronchial wall thickening. Mild interlobular septal thickening is again seen. Multifocal tree-in-bud and ground-glass nodules within the dependent upper lobes are new. Increased bilateral lower lobe, left-greater-than-right consolidative opacities and irregular nodularity. A few scattered cysts are unchanged. No  pneumothorax. Similar small bilateral pleural effusions. Unchanged simple fluid attenuation loculated component along the minor fissure and posterior right upper lung. Additional areas of loculated pleural effusion along the left lateral upper lung and major fissure have decreased or resolved. Upper abdomen: Enteric tube reaches the stomach terminates below the field of view. Innumerable bilateral renal cysts are better evaluated on prior CT abdomen and pelvis. Musculoskeletal: No acute or abnormal lytic or blastic osseous lesions. Similar bilateral gynecomastia. IMPRESSION: 1. Increased bilateral lower lobe, left-greater-than-right consolidative opacities and irregular nodularity with new multifocal tree-in-bud and ground-glass nodules within the dependent upper lobes. Findings are suspicious for multifocal pneumonia. 2. Similar small bilateral pleural effusions with simple fluid attenuation loculated component along the minor fissure and posterior right upper lung. Additional areas of loculated pleural effusion along the left lateral upper lung and major fissure have decreased or resolved. 3. Similar multi station lymphadenopathy, likely reactive. 4. Increased multichamber cardiomegaly. 5. Similar dilated pulmonary artery, which can be seen in the setting of pulmonary hypertension. 6. Aortic Atherosclerosis (ICD10-I70.0). Coronary artery calcifications. Assessment for potential risk factor modification, dietary therapy or pharmacologic therapy may be warranted, if clinically indicated. Electronically Signed   By: Agustin Cree M.D.   On: 01/17/2023 15:37   DG Abd Portable 1V  Result Date: 01/17/2023 CLINICAL DATA:  Feeding tube placement. EXAM: PORTABLE ABDOMEN - 1 VIEW COMPARISON:  KUB 07/31/2015, CT abdomen/pelvis 01/16/2023. FINDINGS: The enteric catheter tip projects over the expected location of the distal stomach. There is a nonobstructive bowel gas pattern. Enteric contrast is noted in the large bowel. An  IVC filter is noted. There is no definite free intraperitoneal air, within the confines of supine technique. A left internal jugular venous catheter projects over the mid SVC. The cardiomediastinal silhouette is stable. There is a right pleural effusion with ovoid opacity projecting over the right midlung, unchanged unchanged and again likely reflecting fissural fluid. A  small left pleural effusion is not significantly changed. IMPRESSION: Enteric catheter tip in the expected location of the distal stomach. Electronically Signed   By: Lesia Hausen M.D.   On: 01/17/2023 14:27     Scheduled Meds:  sodium chloride   Intravenous Once   sodium chloride   Intravenous Once   sodium chloride   Intravenous Once   aspirin  81 mg Per Tube Daily   atorvastatin  40 mg Per Tube Daily   calcitRIOL  3.3 mcg Oral Q M,W,F-HD   Chlorhexidine Gluconate Cloth  6 each Topical Q0600   cinacalcet  90 mg Oral Q M,W,F-HD   feeding supplement (PROSource TF20)  60 mL Per Tube Daily   midodrine  10 mg Oral Q M,W,F   multivitamin  1 tablet Per Tube QHS   pantoprazole (PROTONIX) IV  40 mg Intravenous Q12H   sevelamer carbonate  1,600 mg Per Tube TID WC   sodium chloride flush  3 mL Intravenous Q12H   Continuous Infusions:  sodium chloride     albumin human     feeding supplement (VITAL 1.5 CAL) 1,000 mL (01/18/23 1723)   heparin Stopped (01/18/23 2228)   piperacillin-tazobactam (ZOSYN)  IV 2.25 g (01/19/23 0241)     LOS: 5 days    Time spent:    Zannie Cove, MD [facility]   01/19/2023, 11:06 AM

## 2023-01-19 NOTE — Progress Notes (Signed)
Rounding Note    Patient Name: Travis [name] Date of Encounter: 01/19/2023  [facility] Cardiologist: Meriam Sprague, MD   Subjective   Seen in HD today. Mildly confused. No chest pain. Upset that he may need a colonoscopy.  Hemoglobin dropped to 6.8 with heparin re-challenge. Heparin gtt stopped. Given 1uPRBC today.  Inpatient Medications    Scheduled Meds:  sodium chloride   Intravenous Once   sodium chloride   Intravenous Once   aspirin  81 mg Per Tube Daily   atorvastatin  40 mg Per Tube Daily   calcitRIOL  3.3 mcg Oral Q M,W,F-HD   Chlorhexidine Gluconate Cloth  6 each Topical Q0600   cinacalcet  90 mg Oral Q M,W,F-HD   feeding supplement (PROSource TF20)  60 mL Per Tube Daily   midodrine  10 mg Oral Q M,W,F   multivitamin  1 tablet Per Tube QHS   pantoprazole (PROTONIX) IV  40 mg Intravenous Q12H   sevelamer carbonate  1,600 mg Per Tube TID WC   sodium chloride flush  3 mL Intravenous Q12H   Continuous Infusions:  sodium chloride     albumin human     feeding supplement (VITAL 1.5 CAL) 1,000 mL (01/18/23 1723)   heparin Stopped (01/18/23 2228)   piperacillin-tazobactam (ZOSYN)  IV 2.25 g (01/19/23 0241)   PRN Meds: sodium chloride, albumin human, phenol, sodium chloride flush   Vital Signs    Vitals:   01/19/23 0143 01/19/23 0345 01/19/23 0619 01/19/23 0634  BP: 121/70 116/72 115/67 (P) 115/67  Pulse: 90 97 93 (P) 93  Resp:  (!) 22  (!) (P) 22  Temp:   (!) 97.4 F (36.3 C) (!) (P) 97.4 F (36.3 C)  TempSrc:   Axillary (P) Axillary  SpO2:  100% 100% (P) 100%  Weight:      Height:        Intake/Output Summary (Last 24 hours) at 01/19/2023 0651 Last data filed at 01/18/2023 1500 Gross per 24 hour  Intake 449.88 ml  Output --  Net 449.88 ml       01/19/2023   12:00 AM 01/18/2023    4:05 PM 01/18/2023    5:00 AM  Last 3 Weights  Weight (lbs) 140 lb 14 oz 144 lb 2.9 oz 153 lb 10.6 oz  Weight (kg) 63.9 kg 65.4 kg 69.7 kg       Telemetry    NSR/ST - Personally Reviewed  ECG    No new tracing today- Personally Reviewed  Physical Exam   GEN: Thin, frail, NAD Neck: CVL in place Cardiac: RRR, 1/6 systolic murmur Respiratory: Clear on anterior exam GI: Soft, nontender, non-distended  MS: No edema; No deformity. Neuro:  Confused this morning but able to follow commands Psych: Calm  Labs    High Sensitivity Troponin:   Recent Labs  Lab 01/23/2023 2052 02/04/2023 2330 01/15/23 0101 01/15/23 0835 01/16/23 0928  TROPONINIHS 1,597* 1,697* 1,709* 2,373* 2,679*      Chemistry Recent Labs  Lab 01/16/23 0928 01/17/23 0423 01/17/23 1003 01/17/23 1841 01/18/23 0440 01/18/23 1700 01/19/23 0517  NA 136 132*  --   --  131*  --  134*  K 5.1 4.3  --   --  3.5  --  4.3  CL 91* 92*  --   --  92*  --  98  CO2 16* 22  --   --  23  --  22  GLUCOSE 144* 89  --   --  128*  --  112*  BUN 47* 66*  --   --  43*  --  68*  CREATININE 6.95* 7.34*  --   --  5.14*  --  6.89*  CALCIUM 8.3* 7.5*  --   --  7.5*  --  6.9*  MG  --   --    < > 1.9 2.0 2.2  --   PROT 7.4  --   --   --  6.8  --  5.8*  ALBUMIN 2.8*  --   --   --  2.5*  --  2.1*  AST 492*  --   --   --  356*  --  246*  ALT 403*  --   --   --  453*  --  346*  ALKPHOS 86  --   --   --  92  --  81  BILITOT 1.3*  --   --   --  1.3*  --  1.2  GFRNONAA 7* 7*  --   --  11*  --  7*  ANIONGAP 29* 18*  --   --  16*  --  14   < > = values in this interval not displayed.     Lipids  Recent Labs  Lab 01/15/23 0835  CHOL 119  TRIG 77  HDL 43  LDLCALC 61  CHOLHDL 2.8     Hematology Recent Labs  Lab 01/17/23 0423 01/18/23 0440 01/18/23 2317 01/19/23 0517  WBC 18.1* 14.6*  --  16.4*  RBC 2.78* 2.80*  --  2.18*  HGB 8.6* 8.7* 7.9* 6.8*  HCT 26.9* 27.8* 25.3* 21.9*  MCV 96.8 99.3  --  100.5*  MCH 30.9 31.1  --  31.2  MCHC 32.0 31.3  --  31.1  RDW 18.8* 20.8*  --  21.4*  PLT 196 186  --  202    Thyroid No results for input(s): "TSH", "FREET4" in the  last 168 hours.  BNP Recent Labs  Lab 02/10/2023 1841  BNP >4,500.0*     DDimer No results for input(s): "DDIMER" in the last 168 hours.   Radiology    US Abdomen Limited RUQ (LIVER/GB)  Result Date: 01/17/2023 CLINICAL DATA:  Abdominal pain. EXAM: ULTRASOUND ABDOMEN LIMITED RIGHT UPPER QUADRANT COMPARISON:  CT abdomen pelvis 01/16/2023 FINDINGS: Gallbladder: There are shadowing gallstones measuring up to 1.2 cm. No gallbladder wall thickening. No sonographic Murphy sign noted by sonographer. Common bile duct: Diameter: 0.2 cm, within normal limits Liver: No focal lesion identified. Within normal limits in parenchymal echogenicity. Portal vein is patent on color Doppler imaging with normal direction of blood flow towards the liver. Other: Echogenic right kidney. IMPRESSION: 1. Cholelithiasis without sonographic evidence of acute cholecystitis. 2. Echogenic right kidney which is nonspecific but can be seen in the setting of medical renal disease. Electronically Signed   By: Emmaline Kluver M.D.   On: 01/17/2023 17:17   CT CHEST WO CONTRAST  Result Date: 01/17/2023 CLINICAL DATA:  Suspected sepsis for evaluation of pneumonia EXAM: CT CHEST WITHOUT CONTRAST TECHNIQUE: Multidetector CT imaging of the chest was performed following the standard protocol without IV contrast. RADIATION DOSE REDUCTION: This exam was performed according to the departmental dose-optimization program which includes automated exposure control, adjustment of the mA and/or kV according to patient size and/or use of iterative reconstruction technique. COMPARISON:  CT chest dated 09/19/2022 FINDINGS: Cardiovascular: Left IJ central venous catheter tip terminates in the lower SVC. Right brachiocephalic vein stent in-situ.  Increased multichamber cardiomegaly. No significant pericardial fluid/thickening. Similar dilated pulmonary artery measures 3.6 cm. Extensive coronary artery calcifications. Aortic atherosclerosis. Mediastinum/Nodes:  Imaged thyroid gland without nodules meeting criteria for imaging follow-up by size. Normal esophagus. Similar multi station lymphadenopathy, for example pretracheal measures 10 mm (3:49) and left supraclavicular measures 111 mm (3:18). Lungs/Pleura: The central airways are patent. Trace layering secretions within the trachea. Similar lower lobe predominant diffuse bronchial wall thickening. Mild interlobular septal thickening is again seen. Multifocal tree-in-bud and ground-glass nodules within the dependent upper lobes are new. Increased bilateral lower lobe, left-greater-than-right consolidative opacities and irregular nodularity. A few scattered cysts are unchanged. No pneumothorax. Similar small bilateral pleural effusions. Unchanged simple fluid attenuation loculated component along the minor fissure and posterior right upper lung. Additional areas of loculated pleural effusion along the left lateral upper lung and major fissure have decreased or resolved. Upper abdomen: Enteric tube reaches the stomach terminates below the field of view. Innumerable bilateral renal cysts are better evaluated on prior CT abdomen and pelvis. Musculoskeletal: No acute or abnormal lytic or blastic osseous lesions. Similar bilateral gynecomastia. IMPRESSION: 1. Increased bilateral lower lobe, left-greater-than-right consolidative opacities and irregular nodularity with new multifocal tree-in-bud and ground-glass nodules within the dependent upper lobes. Findings are suspicious for multifocal pneumonia. 2. Similar small bilateral pleural effusions with simple fluid attenuation loculated component along the minor fissure and posterior right upper lung. Additional areas of loculated pleural effusion along the left lateral upper lung and major fissure have decreased or resolved. 3. Similar multi station lymphadenopathy, likely reactive. 4. Increased multichamber cardiomegaly. 5. Similar dilated pulmonary artery, which can be seen in  the setting of pulmonary hypertension. 6. Aortic Atherosclerosis (ICD10-I70.0). Coronary artery calcifications. Assessment for potential risk factor modification, dietary therapy or pharmacologic therapy may be warranted, if clinically indicated. Electronically Signed   By: Agustin Cree M.D.   On: 01/17/2023 15:37   DG Abd Portable 1V  Result Date: 01/17/2023 CLINICAL DATA:  Feeding tube placement. EXAM: PORTABLE ABDOMEN - 1 VIEW COMPARISON:  KUB 07/31/2015, CT abdomen/pelvis 01/16/2023. FINDINGS: The enteric catheter tip projects over the expected location of the distal stomach. There is a nonobstructive bowel gas pattern. Enteric contrast is noted in the large bowel. An IVC filter is noted. There is no definite free intraperitoneal air, within the confines of supine technique. A left internal jugular venous catheter projects over the mid SVC. The cardiomediastinal silhouette is stable. There is a right pleural effusion with ovoid opacity projecting over the right midlung, unchanged unchanged and again likely reflecting fissural fluid. A small left pleural effusion is not significantly changed. IMPRESSION: Enteric catheter tip in the expected location of the distal stomach. Electronically Signed   By: Lesia Hausen M.D.   On: 01/17/2023 14:27    Cardiac Studies   Cardiac Studies & Procedures   CARDIAC CATHETERIZATION  CARDIAC CATHETERIZATION 07/06/2022  Narrative   Heavily calcified diffusely diseased vessel: 1st Diag-1 lesion is 99% stenosed. 1st Diag-2 lesion is 60% stenosed. 1st Diag-3 lesion is 95% stenosed.   The left ventricular systolic function is normal. The left ventricular ejection fraction is 50-55% by visual estimate.   LV end diastolic pressure is moderately elevated.   Hemodynamic findings consistent with mild pulmonary hypertension.  POSTOP DIAGNOSES Moderate-severe 2-vessel single-vessel disease: Proximal D1-99%, mid 60%, distal 95% (heavily calcified/diffusely diseased; not  favorable for PCI would require atherectomy and a relatively small caliber vessel) & 60% ostial sidebranch of OM1 followed by a 60% mid vessel then distal  occlusion (with right to left collaterals). Otherwise mild diffuse disease/calcified vessels Mostly preserved LVEF, cannot exclude anterolateral hypokinesis. LVEDP moderately elevated at 19 mmHg (LVEDP 166/5 mmHg) with a PCWP of ~22 mmHg. RHC numbers: Mean RAP 6 mmHg, RVP-EDP 51/0-5 mmHg; PAP-mean 50/13-27 mmHg; PCWP 17-26 mmHg (~22 mmHg); Ao sat 99%, PA sat 74%. CO-CI: Fick -> 8.22, 4.44; thermal 5.37-2.7 (suspect that the discrepancy is related to AV fistula)   RECOMMENDATIONS Would recommend medical management for heavily diseased D1 branch. This is heavily calcified vessel with diffuse disease, will require atherectomy and extensive stent placement in the vessel is roughly 2 to 2.25 mm distally.  Not favorable for PCI. Consider slightly increased volume removal and HD based on elevated filling pressures. Return to nursing for ongoing care    Bryan Lemma, MD  Findings Coronary Findings Diagnostic  Dominance: Co-dominant  Left Anterior Descending Vessel is normal in caliber. There is mild diffuse disease throughout the vessel. The vessel is mildly calcified.  First Diagonal Branch Vessel is small in size. There is moderate  diffuse disease in the vessel. Diffuse calcification 1st Diag-1 lesion is 99% stenosed. Vessel is the culprit lesion. The lesion is eccentric. The lesion is severely calcified. 1st Diag-2 lesion is 60% stenosed. The lesion is concentric. The lesion is moderately calcified. 1st Diag-3 lesion is 95% stenosed. The lesion is segmental and concentric. The lesion is moderately calcified.  Lateral First Diagonal Branch Vessel is small in size.  Second Diagonal Branch Vessel is small in size.  Left Circumflex Vessel is normal in caliber and large. The vessel exhibits minimal luminal irregularities.  First  Obtuse Marginal Branch Vessel is large in size. The vessel exhibits minimal luminal irregularities.  Lateral First Obtuse Marginal Branch Vessel is small in size.  Second Obtuse Marginal Branch Vessel is small in size.  First Left Posterolateral Branch Vessel is small in size.  Second Left Posterolateral Branch Vessel is small in size.  Third Left Posterolateral Branch Vessel is small in size.  Left Posterior Atrioventricular Artery Vessel is large in size.  Right Coronary Artery Vessel was injected. Vessel is small. There is mild diffuse disease throughout the vessel. The vessel is mildly calcified.  Acute Marginal Branch Vessel is small in size.  Right Ventricular Branch  Right Posterior Descending Artery Vessel is small in size.  First Right Posterolateral Branch Vessel is moderate in size.  Intervention  No interventions have been documented.   STRESS TESTS  MYOCARDIAL PERFUSION IMAGING 01/12/2021  ECHOCARDIOGRAM  ECHOCARDIOGRAM COMPLETE 01/15/2023  Narrative ECHOCARDIOGRAM REPORT    Patient Name:   Travis [name] Date of Exam: 01/15/2023 Medical Rec #:  [id number]       Height:       70.0 in Accession #:    [id number]      Weight:       165.0 lb Date of Birth:  05/22/1941       BSA:          1.923 m Patient Age:    82 years        BP:           125/83 mmHg Patient Gender: M               HR:           109 bpm. Exam Location:  Inpatient  Procedure: 2D Echo, Color Doppler, Cardiac Doppler and Intracardiac Opacification Agent  Indications:    I50.21 Acute systolic (congestive) heart failure  History:  Patient has prior history of Echocardiogram examinations, most recent 07/06/2022. CHF, CAD; Risk Factors:Hypertension.  Sonographer:    Irving Burton Senior RDCS Referring Phys: Darlin Drop   Sonographer Comments: Apical window foreshortened due to thin body habitus. IMPRESSIONS   1. Left ventricular ejection fraction, by estimation, is 25 to 30%.  The left ventricle has severely decreased function. The left ventricle demonstrates regional wall motion abnormalities (see scoring diagram/findings for description). The left ventricular internal cavity size was moderately to severely dilated. There is moderate asymmetric left ventricular hypertrophy of the infero-lateral segment. Indeterminate diastolic filling due to E-A fusion. 2. Right ventricular systolic function is low normal. The right ventricular size is mildly enlarged. There is moderately elevated pulmonary artery systolic pressure. The estimated right ventricular systolic pressure is 55.2 mmHg. 3. Left atrial size was severely dilated. 4. Right atrial size was severely dilated. 5. The mitral valve is degenerative. Moderate mitral valve regurgitation. 6. Tricuspid valve regurgitation is moderate to severe. 7. The aortic valve is tricuspid. There is moderate calcification of the aortic valve. There is moderate thickening of the aortic valve. Aortic valve regurgitation is mild to moderate. Likely moderate vs mild to moderate low flow, low gradient AS with AVA 1.1cm2, mean gradient 14, Vmax 2.33, DI 0.37. SVi low at 20.Marland Kitchen Aortic valve mean gradient measures 14.0 mmHg. Aortic valve Vmax measures 2.33 m/s. 8. The inferior vena cava is dilated in size with <50% respiratory variability, suggesting right atrial pressure of 15 mmHg.  Comparison(s): Compared to prior TTE on 06/2022, the LVEF has dropped from 45-50% to 25-30% with WMA as described. The MR and TR are worse in the setting of elevated filling pressures.  FINDINGS Left Ventricle: Left ventricular ejection fraction, by estimation, is 25 to 30%. The left ventricle has severely decreased function. The left ventricle demonstrates regional wall motion abnormalities. Definity contrast agent was given IV to delineate the left ventricular endocardial borders. The left ventricular internal cavity size was moderately to severely dilated. There is  moderate asymmetric left ventricular hypertrophy of the infero-lateral segment. Indeterminate diastolic filling due to E-A fusion.   LV Wall Scoring: The entire anterior wall, mid and distal lateral wall, entire septum, entire inferior wall, and mid anterolateral segment are hypokinetic. The basal inferolateral segment, basal anterolateral segment, and apex are normal.  Right Ventricle: The right ventricular size is mildly enlarged. No increase in right ventricular wall thickness. Right ventricular systolic function is low normal. There is moderately elevated pulmonary artery systolic pressure. The tricuspid regurgitant velocity is 3.17 m/s, and with an assumed right atrial pressure of 15 mmHg, the estimated right ventricular systolic pressure is 55.2 mmHg.  Left Atrium: Left atrial size was severely dilated.  Right Atrium: Right atrial size was severely dilated.  Pericardium: There is no evidence of pericardial effusion.  Mitral Valve: The mitral valve is degenerative in appearance. There is mild thickening of the mitral valve leaflet(s). There is mild calcification of the mitral valve leaflet(s). Moderate mitral valve regurgitation.  Tricuspid Valve: The tricuspid valve is normal in structure. Tricuspid valve regurgitation is moderate to severe. The flow in the hepatic veins is blunted (decreased) during ventricular systole.  Aortic Valve: The aortic valve is tricuspid. There is moderate calcification of the aortic valve. There is moderate thickening of the aortic valve. Aortic valve regurgitation is mild to moderate. Aortic regurgitation PHT measures 262 msec. Likely moderate vs mild to moderate low flow, low gradient AS with AVA 1.1cm2, mean gradient 14, Vmax 2.33, DI 0.37. SVi  low at 20. Aortic valve mean gradient measures 14.0 mmHg. Aortic valve peak gradient measures 21.7 mmHg. Aortic valve area, by VTI measures 1.08 cm.  Pulmonic Valve: The pulmonic valve was grossly normal.  Pulmonic valve regurgitation is mild.  Aorta: The aortic root and ascending aorta are structurally normal, with no evidence of dilitation.  Venous: A systolic blunting flow pattern is recorded from the left upper pulmonary vein and the right lower pulmonary vein. The inferior vena cava is dilated in size with less than 50% respiratory variability, suggesting right atrial pressure of 15 mmHg.  IAS/Shunts: No atrial level shunt detected by color flow Doppler.   LEFT VENTRICLE PLAX 2D LVIDd:         5.10 cm      Diastology LVIDs:         4.30 cm      LV e' medial:    7.40 cm/s LV PW:         1.40 cm      LV E/e' medial:  16.1 LV IVS:        0.80 cm      LV e' lateral:   11.00 cm/s LVOT diam:     2.00 cm      LV E/e' lateral: 10.8 LV SV:         38 LV SV Index:   20 LVOT Area:     3.14 cm  LV Volumes (MOD) LV vol d, MOD A2C: 196.0 ml LV vol d, MOD A4C: 135.0 ml LV vol s, MOD A2C: 139.0 ml LV vol s, MOD A4C: 85.5 ml LV SV MOD A2C:     57.0 ml LV SV MOD A4C:     135.0 ml LV SV MOD BP:      59.3 ml  RIGHT VENTRICLE RV S prime:     9.57 cm/s TAPSE (M-mode): 1.6 cm  LEFT ATRIUM              Index        RIGHT ATRIUM           Index LA diam:        3.40 cm  1.77 cm/m   RA Area:     23.80 cm LA Vol (A2C):   110.0 ml 57.19 ml/m  RA Volume:   84.40 ml  43.88 ml/m LA Vol (A4C):   82.6 ml  42.94 ml/m LA Biplane Vol: 96.1 ml  49.96 ml/m AORTIC VALVE AV Area (Vmax):    1.16 cm AV Area (Vmean):   1.08 cm AV Area (VTI):     1.08 cm AV Vmax:           233.00 cm/s AV Vmean:          178.000 cm/s AV VTI:            0.352 m AV Peak Grad:      21.7 mmHg AV Mean Grad:      14.0 mmHg LVOT Vmax:         85.90 cm/s LVOT Vmean:        61.300 cm/s LVOT VTI:          0.121 m LVOT/AV VTI ratio: 0.34 AI PHT:            262 msec  AORTA Ao Root diam: 3.20 cm Ao Asc diam:  3.60 cm  MITRAL VALVE                  TRICUSPID VALVE MV Area (  PHT): 4.99 cm       TR Peak grad:   40.2  mmHg MV Decel Time: 152 msec       TR Vmax:        317.00 cm/s MR Peak grad:    82.1 mmHg MR Mean grad:    55.0 mmHg    SHUNTS MR Vmax:         453.00 cm/s  Systemic VTI:  0.12 m MR Vmean:        354.0 cm/s   Systemic Diam: 2.00 cm MR PISA:         1.01 cm MR PISA Eff ROA: 9 mm MR PISA Radius:  0.40 cm MV E velocity: 119.00 cm/s  Laurance Flatten MD Electronically signed by Laurance Flatten MD Signature Date/Time: 01/15/2023/11:16:53 AM    Final              Patient Profile     82 y.o. 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, multivessel CAD, and history of DVT who presented to the ER with chest pain and SOB found to have elevated trop  for which Cardiology was consulted  Assessment & Plan    #Sepsis: #Multifocal PNA: #Lactic Acidosis: -Patient with lactate >9 on 01/16/23 with somnolence on exam prompting transfer to ICU -On transfer to ICU CVP 6, co-ox 78 consistent with sepsis picture -CT chest 5/6 with concern for multifocal PNA -Now on vanc/cefepime/flagyl  -Lactate normalized -Patient now transferred out of ICU  #NSTEMI: #Known Multivessel CAD: -Patient presented with worsening dyspnea and chest pain found to have trop [phone] and BNP >4500 from 366 on prior labs -Cath 06/2022 with 99% D1, 60% D2, 95% D3 with heavily calcified/diffuse disease that was managed medically  -TTE with EF 25-30%, severe septal, mid-to-apical lateral, mid-to-apical anterior and mid-to- apical inferior hypokinesis with relatively preserved LV apex systolic motion, moderate MR, moderate to severe TR, mild to mod AS, mild to moderate AR -Cath deferred for now given concern for sepsis and possible GIB -Will re-consider ischemic evaluation once clinically improves as patient's family has wanted to pursue aggressive measures; may need to re-discuss pending clinical course -Okay to hold ASA if needed given concern for GIB -Continue  lipitor 40mg  daily  #GIB: -H/H dropped again with heparin re-challenge overnight -Written for additional 1upRBC -Holding heparin gtt -Management per IM  #Acute on Chronic Systolic HF Exacerbation: -BNP >4500 (from 365 in 11/2022) on admission with pulmonary edema on CXR consistent with acute on chronic systolic HF exacerbation -On volume management with HD -GDMT limited due to ESRD and hypotension requiring midodrine on HD days  #Acute on Chronic Hypoxic Respiratory Failure: -Likely multifactorial in the setting of volume overload on admission and multifocal PNA on CT chest -Will manage as above  #HTN: -Off medications given hypotension with HD  #ESRD: -Management per Nephrology  #HLD: -Continue lipitor 40mg  daily  #GOC: -Discussion held with the daughter and the patient's wife and they wish to continue full scope of care with invasive measures if needed. Will continue ongoing GOC discussions.    For questions or updates, please contact [facility] Please consult [email or web address] for contact info under        Signed, Meriam Sprague, MD  01/19/2023, 6:51 AM

## 2023-01-19 NOTE — Progress Notes (Addendum)
Order received for additional unit PRBCs to be administered in HD. Contacted HD RN, Madison Hickman, to notify and to request that CBG be rechecked because it was 70 when patient sent to HD. Tory verbalized understanding.

## 2023-01-19 NOTE — Consult Note (Signed)
Reason for Consult: Lower GI bleeding Referring Physician: Hospital team  Travis [name] is an 82 y.o. male.  HPI: Patient seen and examined and case discussed with the hospital team as well as his wife and he was just released from the ICU to the floor and his only complaint is his mittens keeping him from pulling his lines out he is not aware of any bleeding nor is his wife's previous workup was reviewed and his CTA was okay the question of rectal and distal sigmoid inflammation there is no signs of active bleeding and in the past on a flex sig his inflammation was thought secondary to periodic fecal impactions and right now he is not on a blood thinner and his tube feeds are being held  Past Medical History:  Diagnosis Date   Anemia    Arthritis    Chronic combined systolic and diastolic CHF (congestive heart failure) (HCC)    a. Etiology of low EF not defined.   DVT (deep venous thrombosis) (HCC)    a. 01/2015: RLE DVT. VQ scan intermediate probability. Underwent renal bx complicated by perinephric hematoma; anticoagulation stopped and IVC filter placed.   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 Received plasma exchange and steroids but didn't respond and ended up starting hemodialysis June 2016.    Essential hypertension    GERD (gastroesophageal reflux disease)    History of nuclear stress test    Myoview 9/19 [facility]): low risk    HTN (hypertension)    Hypoalbuminemia    Hypoglycemia    NSTEMI (non-ST elevated myocardial infarction) (HCC) 04/18/2015   OA (osteoarthritis) of knee    Perinephric hematoma 01/2015   Proctitis 07/2015   Protein calorie malnutrition (HCC)    Pulmonary fibrosis (HCC)    Sjogren's disease (HCC) 01/28/2015    Past Surgical History:  Procedure Laterality Date   AV FISTULA PLACEMENT Right 02/17/2015   Procedure: INSERTION OF RIGHT ARM  ARTERIOVENOUS (AV) GORE-TEX  GRAFT ;  Surgeon: Sherren Kerns, MD;  Location: [facility] OR;  Service: Vascular;  Laterality: Right;   FLEXIBLE SIGMOIDOSCOPY N/A 08/04/2015   Procedure: FLEXIBLE SIGMOIDOSCOPY;  Surgeon: Charlott Rakes, MD;  Location: [facility] ENDOSCOPY;  Service: Endoscopy;  Laterality: N/A;   HEMORROIDECTOMY  1999   INSERTION OF DIALYSIS CATHETER N/A 02/17/2015   Procedure: INSERTION OF DIALYSIS CATHETER RIGHT INTERNAL JUGULAR VEIN;  Surgeon: Sherren Kerns, MD;  Location: [facility] OR;  Service: Vascular;  Laterality: N/A;   MULTIPLE TOOTH EXTRACTIONS     PERIPHERAL VASCULAR BALLOON ANGIOPLASTY Right 01/23/2019   Procedure: PERIPHERAL VASCULAR BALLOON ANGIOPLASTY;  Surgeon: Sherren Kerns, MD;  Location: [facility] INVASIVE CV LAB;  Service: Cardiovascular;  Laterality: Right;   REVISION OF ARTERIOVENOUS GORETEX GRAFT Right 03/29/2017   Procedure: REVISION OF ARTERIOVENOUS GORETEX GRAFT;  Surgeon: Fransisco Hertz, MD;  Location: [facility] OR;  Service: Vascular;  Laterality: Right;   RIGHT/LEFT HEART CATH AND CORONARY ANGIOGRAPHY N/A 07/06/2022   Procedure: RIGHT/LEFT HEART CATH AND CORONARY ANGIOGRAPHY;  Surgeon: Marykay Lex, MD;  Location: [facility] INVASIVE CV LAB;  Service: Cardiovascular;  Laterality: N/A;   Surgical procedure to remove a mole as a child Right Eye area   At around 60 years old   TOTAL HIP ARTHROPLASTY Right 12/06/2022   Procedure: TOTAL HIP ARTHROPLASTY ANTERIOR APPROACH;  Surgeon: Samson Frederic, MD;  Location: [facility] OR;  Service: Orthopedics;  Laterality: Right;  Family History  Problem Relation Age of Onset   Hypertension Mother    Healthy Father    Hypertension Sister    Hypertension Brother     Social History:  reports that he has never smoked. He has never used smokeless tobacco. He reports that he does not drink alcohol and does not use drugs.  Allergies: No Known Allergies  Medications: I have reviewed the patient's current medications.  Results for orders placed or performed during the hospital  encounter of 01/13/2023 (from the past 48 hour(s))  Glucose, capillary     Status: None   Collection Time: 01/17/23  3:53 PM  Result Value Ref Range   Glucose-Capillary 90 70 - 99 mg/dL    Comment: Glucose reference range applies only to samples taken after fasting for at least 8 hours.   Comment 1 Notify RN    Comment 2 Document in Chart   Magnesium     Status: None   Collection Time: 01/17/23  6:41 PM  Result Value Ref Range   Magnesium 1.9 1.7 - 2.4 mg/dL    Comment: Performed at [facility], [address] [place], [place] [address]  Phosphorus     Status: None   Collection Time: 01/17/23  6:41 PM  Result Value Ref Range   Phosphorus 4.0 2.5 - 4.6 mg/dL    Comment: Performed at [facility] Lab, [address], [place], [place] [address]  CBC     Status: Abnormal   Collection Time: 01/18/23  4:40 AM  Result Value Ref Range   WBC 14.6 (H) 4.0 - 10.5 K/uL   RBC 2.80 (L) 4.22 - 5.81 MIL/uL   Hemoglobin 8.7 (L) 13.0 - 17.0 g/dL   HCT 57.8 (L) 46.9 - 62.9 %   MCV 99.3 80.0 - 100.0 fL   MCH 31.1 26.0 - 34.0 pg   MCHC 31.3 30.0 - 36.0 g/dL   RDW 52.8 (H) 41.3 - 24.4 %   Platelets 186 150 - 400 K/uL   nRBC 18.1 (H) 0.0 - 0.2 %    Comment: Performed at [facility] Lab, [address] [place], [place] [address]  Magnesium     Status: None   Collection Time: 01/18/23  4:40 AM  Result Value Ref Range   Magnesium 2.0 1.7 - 2.4 mg/dL    Comment: Performed at [facility] Lab, [address], [place], [place] [address]  Phosphorus     Status: None   Collection Time: 01/18/23  4:40 AM  Result Value Ref Range   Phosphorus 4.3 2.5 - 4.6 mg/dL    Comment: Performed at [facility], [address], [place], [place] [address]  Basic metabolic panel     Status: Abnormal   Collection Time: 01/18/23  4:40 AM  Result Value Ref Range   Sodium 131 (L) 135 - 145 mmol/L   Potassium 3.5 3.5 - 5.1 mmol/L   Chloride 92 (L) 98 - 111 mmol/L   CO2 23 22 - 32 mmol/L   Glucose,  Bld 128 (H) 70 - 99 mg/dL    Comment: Glucose reference range applies only to samples taken after fasting for at least 8 hours.   BUN 43 (H) 8 - 23 mg/dL   Creatinine, Ser 7.42 (H) 0.61 - 1.24 mg/dL   Calcium 7.5 (L) 8.9 - 10.3 mg/dL   GFR, Estimated 11 (L) >60 mL/min    Comment: (NOTE) Calculated using the CKD-EPI Creatinine Equation (2021)  Anion gap 16 (H) 5 - 15    Comment: Performed at [facility], [address], [place], [place] [address]  Hepatic function panel     Status: Abnormal   Collection Time: 01/18/23  4:40 AM  Result Value Ref Range   Total Protein 6.8 6.5 - 8.1 g/dL   Albumin 2.5 (L) 3.5 - 5.0 g/dL   AST 604 (H) 15 - 41 U/L    Comment: RESULT CONFIRMED BY MANUAL DILUTION   ALT 453 (H) 0 - 44 U/L   Alkaline Phosphatase 92 38 - 126 U/L   Total Bilirubin 1.3 (H) 0.3 - 1.2 mg/dL   Bilirubin, Direct 0.3 (H) 0.0 - 0.2 mg/dL   Indirect Bilirubin 1.0 (H) 0.3 - 0.9 mg/dL    Comment: Performed at [facility], [address] [place], [place] [address]  Occult blood card to lab, stool     Status: Abnormal   Collection Time: 01/18/23  6:00 AM  Result Value Ref Range   Fecal Occult Bld POSITIVE (A) NEGATIVE    Comment: Performed at [facility], [address] [place], [place] [address]  Glucose, capillary     Status: Abnormal   Collection Time: 01/18/23  4:10 PM  Result Value Ref Range   Glucose-Capillary 67 (L) 70 - 99 mg/dL    Comment: Glucose reference range applies only to samples taken after fasting for at least 8 hours.  Magnesium     Status: None   Collection Time: 01/18/23  5:00 PM  Result Value Ref Range   Magnesium 2.2 1.7 - 2.4 mg/dL    Comment: Performed at [facility], [address] [place], [place] [address]  Phosphorus     Status: None   Collection Time: 01/18/23  5:00 PM  Result Value Ref Range   Phosphorus 3.8 2.5 - 4.6 mg/dL    Comment: Performed at [facility], [address] [place], [place] [address]   Heparin level (unfractionated)     Status: None   Collection Time: 01/18/23  5:00 PM  Result Value Ref Range   Heparin Unfractionated 0.52 0.30 - 0.70 IU/mL    Comment: (NOTE) The clinical reportable range upper limit is being lowered to >1.10 to align with the FDA approved guidance for the current laboratory assay.  If heparin results are below expected values, and patient dosage has  been confirmed, suggest follow up testing of antithrombin III levels. Performed at [facility], [address], [place], [place] [address]   Glucose, capillary     Status: None   Collection Time: 01/18/23  6:07 PM  Result Value Ref Range   Glucose-Capillary 78 70 - 99 mg/dL    Comment: Glucose reference range applies only to samples taken after fasting for at least 8 hours.  Glucose, capillary     Status: Abnormal   Collection Time: 01/18/23  8:06 PM  Result Value Ref Range   Glucose-Capillary 102 (H) 70 - 99 mg/dL    Comment: Glucose reference range applies only to samples taken after fasting for at least 8 hours.   Comment 1 Notify RN    Comment 2 Document in Chart   Hemoglobin and hematocrit, blood     Status: Abnormal   Collection Time: 01/18/23 11:17 PM  Result Value Ref Range   Hemoglobin 7.9 (L) 13.0 - 17.0 g/dL   HCT 46.9 (L) 62.9 - 52.8 %    Comment: Performed at  [facility], [address] [place], [place] [address]  Hemoglobin A1c     Status: Abnormal   Collection Time: 01/18/23 11:17 PM  Result Value Ref Range   Hgb A1c MFr Bld 4.7 (L) 4.8 - 5.6 %    Comment: (NOTE) Pre diabetes:          5.7%-6.4%  Diabetes:              >6.4%  Glycemic control for   <7.0% adults with diabetes    Mean Plasma Glucose 88.19 mg/dL    Comment: Performed at [facility] Lab, [address] [place], [place] [address]  Glucose, capillary     Status: None   Collection Time: 01/19/23 12:01 AM  Result Value Ref Range   Glucose-Capillary 94 70 - 99 mg/dL    Comment: Glucose  reference range applies only to samples taken after fasting for at least 8 hours.   Comment 1 Notify RN    Comment 2 Document in Chart   Glucose, capillary     Status: None   Collection Time: 01/19/23  4:38 AM  Result Value Ref Range   Glucose-Capillary 99 70 - 99 mg/dL    Comment: Glucose reference range applies only to samples taken after fasting for at least 8 hours.  Heparin level (unfractionated)     Status: Abnormal   Collection Time: 01/19/23  5:00 AM  Result Value Ref Range   Heparin Unfractionated <0.10 (L) 0.30 - 0.70 IU/mL    Comment: (NOTE) The clinical reportable range upper limit is being lowered to >1.10 to align with the FDA approved guidance for the current laboratory assay.  If heparin results are below expected values, and patient dosage has  been confirmed, suggest follow up testing of antithrombin III levels. Performed at [facility], [address] [place], [place] [address]   CBC     Status: Abnormal   Collection Time: 01/19/23  5:17 AM  Result Value Ref Range   WBC 16.4 (H) 4.0 - 10.5 K/uL   RBC 2.18 (L) 4.22 - 5.81 MIL/uL   Hemoglobin 6.8 (LL) 13.0 - 17.0 g/dL    Comment: REPEATED TO VERIFY THIS CRITICAL RESULT HAS VERIFIED AND BEEN CALLED TO R. AREVALO, RN BY HAYLEE HOWARD ON 05 08 2024 AT 0555, AND HAS BEEN READ BACK.     HCT 21.9 (L) 39.0 - 52.0 %   MCV 100.5 (H) 80.0 - 100.0 fL   MCH 31.2 26.0 - 34.0 pg   MCHC 31.1 30.0 - 36.0 g/dL   RDW 91.4 (H) 78.2 - 95.6 %   Platelets 202 150 - 400 K/uL   nRBC 21.2 (H) 0.0 - 0.2 %    Comment: Performed at [facility] Lab, [address] [place], [place] [address]  Comprehensive metabolic panel     Status: Abnormal   Collection Time: 01/19/23  5:17 AM  Result Value Ref Range   Sodium 134 (L) 135 - 145 mmol/L   Potassium 4.3 3.5 - 5.1 mmol/L   Chloride 98 98 - 111 mmol/L   CO2 22 22 - 32 mmol/L   Glucose, Bld 112 (H) 70 - 99 mg/dL    Comment: Glucose reference range applies only to samples  taken after fasting for at least 8 hours.   BUN 68 (H) 8 - 23 mg/dL   Creatinine, Ser 6.57 (H) 0.61 - 1.24 mg/dL   Calcium 6.9 (L) 8.9 - 10.3 mg/dL   Total  Protein 5.8 (L) 6.5 - 8.1 g/dL   Albumin 2.1 (L) 3.5 - 5.0 g/dL   AST 960 (H) 15 - 41 U/L   ALT 346 (H) 0 - 44 U/L   Alkaline Phosphatase 81 38 - 126 U/L   Total Bilirubin 1.2 0.3 - 1.2 mg/dL   GFR, Estimated 7 (L) >60 mL/min    Comment: (NOTE) Calculated using the CKD-EPI Creatinine Equation (2021)    Anion gap 14 5 - 15    Comment: Performed at [facility] Lab, [address], [place], [place] [address]  Prepare RBC (crossmatch)     Status: None   Collection Time: 01/19/23  6:08 AM  Result Value Ref Range   Order Confirmation      ORDER PROCESSED BY BLOOD BANK Performed at [facility], [address] [place], [place] [address]   Glucose, capillary     Status: None   Collection Time: 01/19/23  7:20 AM  Result Value Ref Range   Glucose-Capillary 70 70 - 99 mg/dL    Comment: Glucose reference range applies only to samples taken after fasting for at least 8 hours.  Prepare RBC (crossmatch)     Status: None   Collection Time: 01/19/23  9:00 AM  Result Value Ref Range   Order Confirmation      ORDER PROCESSED BY BLOOD BANK Performed at [facility], [address], [place], [place] [address]   Glucose, capillary     Status: Abnormal   Collection Time: 01/19/23 12:34 PM  Result Value Ref Range   Glucose-Capillary 13 (LL) 70 - 99 mg/dL    Comment: Glucose reference range applies only to samples taken after fasting for at least 8 hours.   Comment 1 Notify RN   Glucose, capillary     Status: Abnormal   Collection Time: 01/19/23 12:55 PM  Result Value Ref Range   Glucose-Capillary <10 (LL) 70 - 99 mg/dL    Comment: Glucose reference range applies only to samples taken after fasting for at least 8 hours.  Glucose, capillary     Status: Abnormal   Collection Time: 01/19/23 12:59 PM  Result Value Ref Range    Glucose-Capillary 210 (H) 70 - 99 mg/dL    Comment: Glucose reference range applies only to samples taken after fasting for at least 8 hours.  CBC     Status: Abnormal   Collection Time: 01/19/23  1:31 PM  Result Value Ref Range   WBC 14.9 (H) 4.0 - 10.5 K/uL   RBC 2.96 (L) 4.22 - 5.81 MIL/uL   Hemoglobin 9.2 (L) 13.0 - 17.0 g/dL    Comment: REPEATED TO VERIFY POST TRANSFUSION SPECIMEN    HCT 27.2 (L) 39.0 - 52.0 %   MCV 91.9 80.0 - 100.0 fL    Comment: POST TRANSFUSION SPECIMEN REPEATED TO VERIFY    MCH 31.1 26.0 - 34.0 pg   MCHC 33.8 30.0 - 36.0 g/dL   RDW 56.2 (H) 13.0 - 86.5 %   Platelets 150 150 - 400 K/uL   nRBC 26.0 (H) 0.0 - 0.2 %    Comment: Performed at [facility], [address] [place], [place] [address]  Basic metabolic panel     Status: Abnormal   Collection Time: 01/19/23  1:31 PM  Result Value Ref Range   Sodium 138 135 - 145 mmol/L   Potassium 3.4 (L) 3.5 - 5.1 mmol/L   Chloride 99 98 - 111 mmol/L  CO2 26 22 - 32 mmol/L   Glucose, Bld 192 (H) 70 - 99 mg/dL    Comment: Glucose reference range applies only to samples taken after fasting for at least 8 hours.   BUN 36 (H) 8 - 23 mg/dL   Creatinine, Ser 8.11 (H) 0.61 - 1.24 mg/dL   Calcium 7.2 (L) 8.9 - 10.3 mg/dL   GFR, Estimated 13 (L) >60 mL/min    Comment: (NOTE) Calculated using the CKD-EPI Creatinine Equation (2021)    Anion gap 13 5 - 15    Comment: Performed at [facility] Lab, [address], [place], [place] [address]    CT Angio Abd/Pel w/ and/or w/o  Result Date: 01/19/2023 CLINICAL DATA:  Lower GI bleed EXAM: CTA ABDOMEN AND PELVIS WITHOUT AND WITH CONTRAST TECHNIQUE: Multidetector CT imaging of the abdomen and pelvis was performed using the standard protocol during bolus administration of intravenous contrast. Multiplanar reconstructed images and MIPs were obtained and reviewed to evaluate the vascular anatomy. RADIATION DOSE REDUCTION: This exam was performed according to the  departmental dose-optimization program which includes automated exposure control, adjustment of the mA and/or kV according to patient size and/or use of iterative reconstruction technique. CONTRAST:  OMNIPAQUE IOHEXOL 350 MG/ML SOLN COMPARISON:  01/16/2023 FINDINGS: VASCULAR Normal contour and caliber of the abdominal aorta. No evidence of aneurysm, dissection, or other acute aortic pathology. Standard branching pattern of the abdominal aorta, with solitary bilateral renal arteries. Atherosclerosis at the branch vessel origins without high-grade stenosis. Infrarenal IVC filter. Review of the MIP images confirms the above findings. NON-VASCULAR Lower chest: Small, loculated appearing bilateral pleural effusions with associated scarring or atelectasis. Cardiomegaly. Coronary artery calcifications. Hepatobiliary: No solid liver abnormality is seen. Numerous small gallstones. No gallbladder wall thickening, or biliary dilatation. Pancreas: Unremarkable. No pancreatic ductal dilatation or surrounding inflammatory changes. Spleen: Normal in size without significant abnormality. Adrenals/Urinary Tract: Adrenal glands are unremarkable. Innumerable renal cysts of varying sizes, without obvious solid renal lesion or suspicious contrast enhancement. At least 1 intrinsically hyperdense, nonenhancing hemorrhagic or proteinaceous cyst of the lateral midportion of the right kidney (series 6, image 33). Bladder is unremarkable. Stomach/Bowel: Stomach is within normal limits. Appendix appears normal. Sigmoid diverticulosis. Some evidence of mucosal hyperenhancement involving the distal colon and rectum (series 13, image 66), without intraluminal contrast extravasation. Fluid present in the colon to the rectum. Lymphatic: Aortic atherosclerosis. No enlarged abdominal or pelvic lymph nodes. Reproductive: No mass or other significant abnormality. Other: No abdominal wall hernia or abnormality. No ascites. Musculoskeletal: No  acute osseous findings. 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 IMPRESSION: 1. Some evidence of mucosal hyperenhancement involving the distal colon and rectum, without intraluminal contrast extravasation specifically localize reported GI bleeding. Fluid present in the colon to the rectum. Findings are consistent with nonspecific infectious, inflammatory, or ischemic colitis and associated diarrheal illness. 2. Sigmoid diverticulosis without specific evidence of acute diverticulitis. 3. Aortic atherosclerosis. No evidence of aortic aneurysm, dissection, or other acute aortic pathology. 4. Small, loculated appearing bilateral pleural effusions with associated scarring or atelectasis. 5. Cardiomegaly and coronary artery disease. 6. Cholelithiasis. 7. Polycystic kidney disease. Aortic Atherosclerosis (ICD10-I70.0). Electronically Signed   By: Jearld Lesch M.D.   On: 01/19/2023 14:53   US Abdomen Limited RUQ (LIVER/GB)  Result Date: 01/17/2023 CLINICAL DATA:  Abdominal pain. EXAM: ULTRASOUND ABDOMEN LIMITED RIGHT UPPER QUADRANT COMPARISON:  CT abdomen pelvis 01/16/2023 FINDINGS: Gallbladder: There are shadowing gallstones measuring up to 1.2 cm. No gallbladder wall thickening. No  sonographic Eulah Pont sign noted by sonographer. Common bile duct: Diameter: 0.2 cm, within normal limits Liver: No focal lesion identified. Within normal limits in parenchymal echogenicity. Portal vein is patent on color Doppler imaging with normal direction of blood flow towards the liver. Other: Echogenic right kidney. IMPRESSION: 1. Cholelithiasis without sonographic evidence of acute cholecystitis. 2. Echogenic right kidney which is nonspecific but can be seen in the setting of medical renal disease. Electronically Signed   By: Emmaline Kluver M.D.   On: 01/17/2023 17:17    ROS negative except above Blood pressure 128/77, pulse (!) 108, temperature 97.6 F (36.4 C), temperature source Oral, resp. rate (!) 26,  height 5\' 10"  (1.778 m), weight 62.2 kg, SpO2 100 %. Physical Exam Vital signs stable afebrile no acute distress abdomen is soft nontender hemoglobin came up nicely with transfusion BUN and creatinine improved nicely with dialysis multiple CTs and regular x-rays reviewed Assessment/Plan: Lower GI bleeding in patient with significant other medical issues Plan: Okay with me to put this through his feeding tube if he can swallow liquids he can have them otherwise continue observation for now will check on tomorrow please call us back if signs of significant increased bleeding but might consider a nuclear bleeding scan or even a discussion with IR to see if they should proceed right away  [facility] E 01/19/2023, 3:27 PM

## 2023-01-19 NOTE — Progress Notes (Signed)
Contacted Dr. Jomarie Longs to notify of CBG result of 35. Sample obtained from PICC. D50 administered.

## 2023-01-19 NOTE — Progress Notes (Signed)
Discussed patient with Dr. Jomarie Longs. Patient is currently in HD. Received report from night shift RN that patient had multiple large episodes of bright red blood with clots from rectum throughout night. Also pulled coretrack out during night shift.  Order received for unit of PRBCs to be administered in HD. Tory (HD RN) notified of order for PRBCs to be administered in HD. Also requested that Tory (HD) recheck blood sugar since result was 70 prior to transport to HD.

## 2023-01-19 NOTE — Procedures (Signed)
Cortrak  Person Inserting Tube:  Aalaya Yadao L, RD Tube Type:  Cortrak - 43 inches Tube Size:  10 Tube Location:  Left nare Secured by: Bridle Technique Used to Measure Tube Placement:  Marking at nare/corner of mouth Cortrak Secured At:  72 cm   Cortrak Tube Team Note:  Consult received to place a Cortrak feeding tube.   X-ray is required, abdominal x-ray has been ordered by the Cortrak team. Please confirm tube placement before using the Cortrak tube.   If the tube becomes dislodged please keep the tube and contact the Cortrak team at [email or web address] for replacement.  If after hours and replacement cannot be delayed, place a NG tube and confirm placement with an abdominal x-ray.    Bralynn Donado RD, LDN Clinical Dietitian See AMiON for contact information.    

## 2023-01-19 NOTE — Progress Notes (Addendum)
POST HD TX NOTE  Addendum to note below: Correction to HD tx intake = ( PRBC unit 1 + NS flush of blood lines 50ml + PRBC unit 2 + NS flush of blood lines 50ml + NS bolus for low bp = Fluid removed changed to : (value from machine) - 830 (intake) = . I forgot to add all the intake when documenting earlier   01/19/23 1141  Vitals  Temp 97.6 F (36.4 C)  Temp Source Oral  BP 129/77  BP Location Left Arm  BP Method Automatic  Patient Position (if appropriate) Lying  Pulse Rate 92  Pulse Rate Source Monitor  ECG Heart Rate 92  Resp 16  Oxygen Therapy  SpO2 100 %  O2 Device Nasal Cannula  Patient Activity (if Appropriate) In bed  Pulse Oximetry Type Continuous  During Treatment Monitoring  Intra-Hemodialysis Comments (S)   (post HD tx VS check)  Post Treatment  Dialyzer Clearance Lightly streaked  Duration of HD Treatment -hour(s) 2.5 hour(s) (2 hr 35 min)  Hemodialysis Intake (mL) 0 mL  Liters Processed 46.6  Fluid Removed (mL) 1300 mL  Tolerated HD Treatment Yes  Post-Hemodialysis Comments (S)  tx ended 55 min early d/t pt requesting to end his HD tx today. pt also voiced wanting to stop HD altogether. Eugenie Norrie, RN AD made PA aware that pt stated this. UF goal not met, blood rinsed back. Pt also received 2 units of PRBC on HD tx, VSS. Medication Admin: none:  AVG/AVF Arterial Site Held (minutes) 5 minutes  AVG/AVF Venous Site Held (minutes) 5 minutes  Fistula / Graft Right Forearm Arteriovenous vein graft  No placement date or time found.   Orientation: Right  Access Location: Forearm  Access Type: Arteriovenous vein graft  Site Condition No complications  Fistula / Graft Assessment Bruit;Thrill;Present  Drainage Description None

## 2023-01-19 NOTE — Progress Notes (Signed)
SLP Cancellation Note  Patient Details Name: Rande Kerstein MRN: [id number] DOB: 06-20-1941   Cancelled treatment:       Reason Eval/Treat Not Completed: Patient at procedure or test/unavailable. Pt about to have a CT abdomen per RN. Will be available until 230 for swallowing assessment. Pt appears delirious. Advised RN to notify dietitian about Cortrak if they are planning to replace.    Meila Berke, Riley Nearing 01/19/2023, 1:33 PM

## 2023-01-19 NOTE — Progress Notes (Signed)
Patient reported to several staff members that he is in pain and wanting to finish his dialysis treatment early today. This RN discussed with the patient the benefit of remaining on the machine, patient again stated "I am hurting everywhere, take me off". Treatment was terminated provider notified.

## 2023-01-20 DIAGNOSIS — I214 Non-ST elevation (NSTEMI) myocardial infarction: Secondary | ICD-10-CM | POA: Diagnosis not present

## 2023-01-20 DIAGNOSIS — M3509 Sicca syndrome with other organ involvement: Secondary | ICD-10-CM

## 2023-01-20 DIAGNOSIS — J9601 Acute respiratory failure with hypoxia: Secondary | ICD-10-CM | POA: Diagnosis not present

## 2023-01-20 DIAGNOSIS — I5043 Acute on chronic combined systolic (congestive) and diastolic (congestive) heart failure: Secondary | ICD-10-CM | POA: Diagnosis not present

## 2023-01-20 DIAGNOSIS — N186 End stage renal disease: Secondary | ICD-10-CM | POA: Diagnosis not present

## 2023-01-20 DIAGNOSIS — Z7189 Other specified counseling: Secondary | ICD-10-CM

## 2023-01-20 DIAGNOSIS — I2510 Atherosclerotic heart disease of native coronary artery without angina pectoris: Secondary | ICD-10-CM | POA: Diagnosis not present

## 2023-01-20 LAB — CBC
HCT: 25.1 % — ABNORMAL LOW (ref 39.0–52.0)
HCT: 19.1 % — ABNORMAL LOW (ref 39.0–52.0)
Hemoglobin: 8.2 g/dL — ABNORMAL LOW (ref 13.0–17.0)
Hemoglobin: 6 g/dL — CL (ref 13.0–17.0)
MCH: 31.2 pg (ref 26.0–34.0)
MCH: 30.9 pg (ref 26.0–34.0)
MCHC: 32.7 g/dL (ref 30.0–36.0)
MCHC: 31.4 g/dL (ref 30.0–36.0)
MCV: 95.4 fL (ref 80.0–100.0)
MCV: 98.5 fL (ref 80.0–100.0)
Platelets: 164 10*3/uL (ref 150–400)
Platelets: 142 10*3/uL — ABNORMAL LOW (ref 150–400)
RBC: 2.63 MIL/uL — ABNORMAL LOW (ref 4.22–5.81)
RBC: 1.94 MIL/uL — ABNORMAL LOW (ref 4.22–5.81)
RDW: 21.2 % — ABNORMAL HIGH (ref 11.5–15.5)
RDW: 21.4 % — ABNORMAL HIGH (ref 11.5–15.5)
WBC: 15.9 10*3/uL — ABNORMAL HIGH (ref 4.0–10.5)
WBC: 15.1 10*3/uL — ABNORMAL HIGH (ref 4.0–10.5)
nRBC: 8.1 % — ABNORMAL HIGH (ref 0.0–0.2)
nRBC: 7.9 % — ABNORMAL HIGH (ref 0.0–0.2)

## 2023-01-20 LAB — COMPREHENSIVE METABOLIC PANEL
ALT: 241 U/L — ABNORMAL HIGH (ref 0–44)
AST: 125 U/L — ABNORMAL HIGH (ref 15–41)
Albumin: 2.1 g/dL — ABNORMAL LOW (ref 3.5–5.0)
Alkaline Phosphatase: 72 U/L (ref 38–126)
Anion gap: 13 (ref 5–15)
BUN: 44 mg/dL — ABNORMAL HIGH (ref 8–23)
CO2: 25 mmol/L (ref 22–32)
Calcium: 7.2 mg/dL — ABNORMAL LOW (ref 8.9–10.3)
Chloride: 95 mmol/L — ABNORMAL LOW (ref 98–111)
Creatinine, Ser: 5.69 mg/dL — ABNORMAL HIGH (ref 0.61–1.24)
GFR, Estimated: 9 mL/min — ABNORMAL LOW (ref >60)
Glucose, Bld: 148 mg/dL — ABNORMAL HIGH (ref 70–99)
Potassium: 3.3 mmol/L — ABNORMAL LOW (ref 3.5–5.1)
Sodium: 133 mmol/L — ABNORMAL LOW (ref 135–145)
Total Bilirubin: 1.4 mg/dL — ABNORMAL HIGH (ref 0.3–1.2)
Total Protein: 5.7 g/dL — ABNORMAL LOW (ref 6.5–8.1)

## 2023-01-20 LAB — BPAM RBC
Blood Product Expiration Date: 202406022359
ISSUE DATE / TIME: 202405080904
Unit Type and Rh: 6200
Unit Type and Rh: 6200
Unit Type and Rh: 6200

## 2023-01-20 LAB — GLUCOSE, CAPILLARY
Glucose-Capillary: 111 mg/dL — ABNORMAL HIGH (ref 70–99)
Glucose-Capillary: 112 mg/dL — ABNORMAL HIGH (ref 70–99)
Glucose-Capillary: 112 mg/dL — ABNORMAL HIGH (ref 70–99)
Glucose-Capillary: 143 mg/dL — ABNORMAL HIGH (ref 70–99)
Glucose-Capillary: 144 mg/dL — ABNORMAL HIGH (ref 70–99)
Glucose-Capillary: 84 mg/dL (ref 70–99)
Glucose-Capillary: 92 mg/dL (ref 70–99)
Glucose-Capillary: 97 mg/dL (ref 70–99)

## 2023-01-20 LAB — PREPARE RBC (CROSSMATCH)

## 2023-01-20 LAB — TYPE AND SCREEN
Antibody Screen: NEGATIVE
Unit division: 0
Unit division: 0
Unit division: 0

## 2023-01-20 LAB — CULTURE, BLOOD (ROUTINE X 2)

## 2023-01-20 MED ORDER — RENA-VITE PO TABS: 1.0000 | ORAL_TABLET | Freq: Every day | ORAL | Status: DC

## 2023-01-20 MED ORDER — ENSURE ENLIVE PO LIQD
237.0000 mL | Freq: Three times a day (TID) | ORAL | Status: DC
  Administered 2023-01-20 – 2023-01-21 (×2): 237 mL via ORAL

## 2023-01-20 MED ORDER — SODIUM CHLORIDE 0.9% IV SOLUTION: Freq: Once | INTRAVENOUS | Status: AC

## 2023-01-20 MED ORDER — DARBEPOETIN ALFA 150 MCG/0.3ML IJ SOSY
150.0000 ug | PREFILLED_SYRINGE | INTRAMUSCULAR | Status: DC
  Filled 2023-01-20: qty 0.3

## 2023-01-20 MED ORDER — CHLORHEXIDINE GLUCONATE CLOTH 2 % EX PADS
6.0000 | MEDICATED_PAD | Freq: Every day | CUTANEOUS | Status: DC
  Administered 2023-01-20 – 2023-01-21 (×2): 6 via TOPICAL

## 2023-01-20 MED ORDER — OXYCODONE-ACETAMINOPHEN 5-325 MG PO TABS: 1.0000 | ORAL_TABLET | ORAL | Status: DC | PRN

## 2023-01-20 NOTE — Progress Notes (Addendum)
OT Cancellation Note  Patient Details Name: Travis [name] MRN: [id number] DOB: [birth year]   Cancelled Treatment:    Reason Eval/Treat Not Completed: Medical issues which prohibited therapy (went to engage patient in therapy, upon pulling back blankets pt bed, sheets, and self noted to be covered in dark brown reddish stool. NT notified and assisting patient) OT to f/u with patient as able  01/20/2023  AB, OTR/L  Acute Rehabilitation Services  Office: [phone]   Tristan Schroeder 01/20/2023, 5:43 PM

## 2023-01-20 NOTE — Progress Notes (Signed)
Nutrition Follow-up  DOCUMENTATION CODES:   Non-severe (moderate) malnutrition in context of chronic illness  INTERVENTION:  Monitor for diet tolerance and advancement Feeding with assistance Ensure Enlive po TID, each supplement provides 350 kcal and 20 grams of protein. Magic cup TID with meals, each supplement provides 290 kcal and 9 grams of protein Renal MVI with minerals daily  NUTRITION DIAGNOSIS:   Moderate Malnutrition related to chronic illness as evidenced by moderate fat depletion, severe muscle depletion, moderate muscle depletion. - remains applicable  GOAL:   Patient will meet greater than or equal to 90% of their needs - goal unmet, addressing via meals and nutrition supplements  MONITOR:   PO intake, Supplement acceptance, Diet advancement, Labs, Weight trends, Skin  REASON FOR ASSESSMENT:   Consult Assessment of nutrition requirement/status  ASSESSMENT:   82 year-old male with medical history of ESRD on HD, HFrEF, HTN, CAD, GERD, arthritis, Sjogren's disease, pulmonary fibrosis, anemia, protein calorie malnutrition, NSTEMI. He presented to the ED on 5/3 due to shortness of breath and was admitted for management of NSTEMI and acute on chronic HF. On 5/5 he was noted to have change in mental status and labs indicated increased troponin, markedly elevated BNP, lactic acid > 9, and elevated LFTs. Cardiology following. Patient was transferred to the ICU on 5/5.  5/03 Admitted, NSTEMI 5/04 iHD 5/05 AMS, tx to ICU, CT C/A/P with bilateral loculated pleural effusions and associated pleural thickening. Marked cardiomegaly. Cholelithiasis without CT evidence of acute cholecystitis. Trace upper abdominal ascites. Colonic diverticulosis without diverticulitis. 5/06 Cortrak placed, TF initiated 5/7 pt self removed Cortrak 5/8 Cortrak replaced, pt self removed again 5/9 s/p ST assessment- recommend regular diet with thin liquids however in light of GIB diet advanced to  full liquids   GI following. Continue with observation of lower GIB. No interventions planned at this time. PMT family meeting today for GOC.   HD session ended early yesterday d/t pain. Next HD session on Friday.   EDW: 67.4 kg Current weight: 62.2 kg  Medications: caclcitriol, cincacalcet, rena-vit, protonix, renvela IV drip: D10 @ 93ml/hr, zosyn  Labs: sodium 133, potassium 3.3, BUN 44, Cr 5.69, AST 125, ALT 241, GFR 9, CBG's 35-144 x24 hours  Diet Order:   Diet Order             Diet full liquid Room service appropriate? Yes; Fluid consistency: Thin  Diet effective now                   EDUCATION NEEDS:   Not appropriate for education at this time  Skin:  Skin Assessment: Skin Integrity Issues: Skin Integrity Issues:: Other (Comment) Other: PI mid coccyx  Last BM:  5/9 (type 7 x2 medium, black)  Height:   Ht Readings from Last 1 Encounters:  01/18/23 5\' 10"  (1.778 m)    Weight:   Wt Readings from Last 1 Encounters:  01/19/23 62.2 kg    Ideal Body Weight:  75.4 kg  BMI:  Body mass index is 19.68 kg/m.  Estimated Nutritional Needs:   Kcal:  1700-1900 kcals  Protein:  85-100 g  Fluid:  UOP + 1 L/day  Drusilla Kanner, RDN, LDN Clinical Nutrition

## 2023-01-20 NOTE — Plan of Care (Signed)
  Problem: Clinical Measurements: Goal: Ability to maintain clinical measurements within normal limits will improve Outcome: Progressing   Problem: Nutrition: Goal: Adequate nutrition will be maintained Outcome: Progressing   Problem: Elimination: Goal: Will not experience complications related to bowel motility Outcome: Progressing   Problem: Skin Integrity: Goal: Risk for impaired skin integrity will decrease Outcome: Progressing   Problem: Fluid Volume: Goal: Hemodynamic stability will improve Outcome: Progressing   Problem: Safety: Goal: Non-violent Restraint(s) Outcome: Progressing

## 2023-01-20 NOTE — Progress Notes (Signed)
PROGRESS NOTE    Travis [name]  ZOX:[id number] DOB: Jul 04, 1941 DOA: 01/16/2023 PCP: [facility]  82/M with ESRD on hemodialysis, chronic systolic CHF, multivessel CAD, history of DVT and IVC filter presented to the ED 5/3 with shortness of breath, admitted for NSTEMI and acute on chronic systolic CHF -5/4 treated with dialysis -5/5 had worsening encephalopathy, leukocytosis and lactic acidosis, concern for severe sepsis, transferred to ICU, further workup noted multifocal pneumonia and pleural effusions, treated with IV Zosyn -Hospitalization complicated by dysphagia, core track placed -Also intermittent lower GI bleed in ICU -5/7 night had 2-3 episodes of lower GI bleed, hemoglobin down to 6.8, transfused 1 unit PRBC -5/8, transferred from ICU/PCCM to [facility] service, pulled out cortrak , 3 episodes of large volume hematochezia, GI consulted, stat CTA-mild colon wall thickening, cortrak replaced -5/9, pulled out core track again, GI bleeding improving  Subjective: -Feels fair, denies any specific complaints, intermittent confusion  Assessment and Plan:  Acute on chronic combined systolic and diastolic CHF -Echo with EF 25%, low normal RV, moderate mitral regurgitation, severe TR -Poor prognosis -Volume management with HD -Continue midodrine on dialysis days  NSTEMI Known multivessel CAD -On medical management, echo with wall motion abnormalities, troponin peaked at 1700 -Cards following, IV heparin held for GI bleed, cath deferred in the setting of sepsis/hypotension, encephalopathy -Continue Lipitor, aspirin on hold  Lower GI bleed Acute blood loss anemia -Intermittent episodes noted in ICU, CT abdomen 5/5 largely unremarkable, colonic diverticulosis noted -IV heparin held temporarily restarted, stopped again -GI consulted 5/8, seen by Dr. Ewing Schlein, bleeding appears to be slowing, stat CTA with some colon wall thickening, continue IV Zosyn day 3 now  Severe sepsis,  shock Multifocal/aspiration pneumonia Lactic acidosis -Multifocal pneumonia noted on CT -Blood cultures negative -On IV Zosyn day 3 -Dysphagia noted on swallow eval  Dysphagia -Has severe dysphagia, SLP eval completed, has pharyngeal dysphagia, alternate means of nutrition recommended -Pulled out 2 cortraks in 48 hours, SLP consulting, finally recommended p.o. trial today  End stage renal disease on dialysis (HCC) - On hemodialysis, nephrology following,   Acute metabolic encephalopathy -In the setting of sepsis, shock etc.  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 - Holding home apixaban - Treated with IV heparin, then held for lower GI bleed -Restart apixaban in 48 hours if no further bleeding, overall prognosis is poor  HTN (hypertension) - Hold Coreg - Continue midodrine  Sjogren's disease (HCC) Old diagnosis.  Not currently on disease active medication  ETHICs: 82/M, chronically ill with ESRD, heart failure, multivessel CAD, encephalopathy, dysphagia, admitted with NSTEMI, CHF complicated by aspiration pneumonia and severe dysphagia, poor prognosis discussed with family by ICU team and myself, for family meeting today  DVT prophylaxis: SCDs Code Status: Full code Family Communication: No family at bedside, called and updated daughter yesterday, will reattempt today Disposition Plan: To be determined  Consultants: Cards, nephrology, palliative care, GI   Procedures:   Antimicrobials:    Objective: Vitals:   01/20/23 0700 01/20/23 0739 01/20/23 0800 01/20/23 1114  BP: 107/72 103/68 107/64 112/68  Pulse: (!) 101 96 93 100  Resp: 18 20 17 18   Temp:  97.8 F (36.6 C)    TempSrc:  Oral    SpO2: 98% 99% 100% 100%  Weight:      Height:        Intake/Output Summary (Last 24 hours) at 01/20/2023 1231 Last data filed at 01/20/2023 0451 Gross per 24 hour  Intake 1491.68 ml  Output  3 ml  Net 1488.68 ml   Filed Weights   01/19/23 0000 01/19/23  0815 01/19/23 1141  Weight: 63.9 kg 63.5 kg 62.2 kg    Examination:  General exam: Chronically ill elderly male seen on dialysis, awake alert oriented to self and partly to place only HEENT: Positive JVD CVS: S1-S2, regular rhythm Lungs: Bilateral rhonchi and few basilar rales Abdomen: Soft, nontender, bowel sounds present Extremities: No edema Skin: No rashes Psychiatry: Flat affect, poor insight and judgment    Data Reviewed:   CBC: Recent Labs  Lab 01/29/2023 1841 01/15/23 0835 01/17/23 0423 01/18/23 0440 01/18/23 2317 01/19/23 0517 01/19/23 1331 01/20/23 0440  WBC 15.1*   < > 18.1* 14.6*  --  16.4* 14.9* 15.9*  NEUTROABS 12.2*  --   --   --   --   --   --   --   HGB 8.0*   < > 8.6* 8.7* 7.9* 6.8* 9.2* 8.2*  HCT 26.2*   < > 26.9* 27.8* 25.3* 21.9* 27.2* 25.1*  MCV 99.6   < > 96.8 99.3  --  100.5* 91.9 95.4  PLT 191   < > 196 186  --  202 150 164   < > = values in this interval not displayed.   Basic Metabolic Panel: Recent Labs  Lab 01/15/23 0835 01/16/23 0345 01/17/23 0423 01/17/23 1003 01/17/23 1841 01/18/23 0440 01/18/23 1700 01/19/23 0517 01/19/23 1331 01/20/23 0440  NA 135   < > 132*  --   --  131*  --  134* 138 133*  K 3.8   < > 4.3  --   --  3.5  --  4.3 3.4* 3.3*  CL 90*   < > 92*  --   --  92*  --  98 99 95*  CO2 26   < > 22  --   --  23  --  22 26 25   GLUCOSE 99   < > 89  --   --  128*  --  112* 192* 148*  BUN 20   < > 66*  --   --  43*  --  68* 36* 44*  CREATININE 4.55*   < > 7.34*  --   --  5.14*  --  6.89* 4.23* 5.69*  CALCIUM 8.5*   < > 7.5*  --   --  7.5*  --  6.9* 7.2* 7.2*  MG 2.1  --   --  2.0 1.9 2.0 2.2  --   --   --   PHOS 3.6  --   --  3.3 4.0 4.3 3.8  --   --   --    < > = values in this interval not displayed.   GFR: Estimated Creatinine Clearance: 9 mL/min (A) (by C-G formula based on SCr of 5.69 mg/dL (H)). Liver Function Tests: Recent Labs  Lab 01/15/23 0835 01/16/23 0928 01/18/23 0440 01/19/23 0517 01/20/23 0440   AST  --  492* 356* 246* 125*  ALT  --  403* 453* 346* 241*  ALKPHOS  --  86 92 81 72  BILITOT  --  1.3* 1.3* 1.2 1.4*  PROT  --  7.4 6.8 5.8* 5.7*  ALBUMIN 3.0* 2.8* 2.5* 2.1* 2.1*   Recent Labs  Lab 02/05/2023 1841 01/16/23 1039  LIPASE 32 40   Recent Labs  Lab 01/16/23 1358  AMMONIA 26   Coagulation Profile: Recent Labs  Lab 01/16/23 0928  INR 1.9*   Cardiac  Enzymes: No results for input(s): "CKTOTAL", "CKMB", "CKMBINDEX", "TROPONINI" in the last 168 hours. BNP (last 3 results) No results for input(s): "PROBNP" in the last 8760 hours. HbA1C: Recent Labs    01/18/23 2317  HGBA1C 4.7*   CBG: Recent Labs  Lab 01/19/23 2036 01/20/23 0037 01/20/23 0445 01/20/23 0802 01/20/23 1123  GLUCAP 144* 144* 143* 112* 84   Lipid Profile: No results for input(s): "CHOL", "HDL", "LDLCALC", "TRIG", "CHOLHDL", "LDLDIRECT" in the last 72 hours. Thyroid Function Tests: No results for input(s): "TSH", "T4TOTAL", "FREET4", "T3FREE", "THYROIDAB" in the last 72 hours. Anemia Panel: No results for input(s): "VITAMINB12", "FOLATE", "FERRITIN", "TIBC", "IRON", "RETICCTPCT" in the last 72 hours. Urine analysis:    Component Value Date/Time   COLORURINE AMBER (A) 01/29/2015 2200   APPEARANCEUR CLOUDY (A) 01/29/2015 2200   LABSPEC 1.013 01/29/2015 2200   PHURINE 5.0 01/29/2015 2200   GLUCOSEU NEGATIVE 01/29/2015 2200   HGBUR LARGE (A) 01/29/2015 2200   BILIRUBINUR NEGATIVE 01/29/2015 2200   KETONESUR NEGATIVE 01/29/2015 2200   PROTEINUR 100 (A) 01/29/2015 2200   UROBILINOGEN 0.2 01/29/2015 2200   NITRITE NEGATIVE 01/29/2015 2200   LEUKOCYTESUR NEGATIVE 01/29/2015 2200   Sepsis Labs: @LABRCNTIP (procalcitonin:4,lacticidven:4)  ) Recent Results (from the past 240 hour(s))  SARS Coronavirus 2 by RT PCR (hospital order, performed in [facility] hospital lab) *cepheid single result test* Anterior Nasal Swab     Status: None   Collection Time: 01/23/2023  6:23 PM   Specimen:  Anterior Nasal Swab  Result Value Ref Range Status   SARS Coronavirus 2 by RT PCR NEGATIVE NEGATIVE Final    Comment: Performed at [facility], [address] [place], [place] [address]  Culture, blood (Routine X 2) w Reflex to ID Panel     Status: None (Preliminary result)   Collection Time: 01/16/23  9:30 AM   Specimen: BLOOD LEFT HAND  Result Value Ref Range Status   Specimen Description BLOOD LEFT HAND  Final   Special Requests   Final    BOTTLES DRAWN AEROBIC AND ANAEROBIC Blood Culture results may not be optimal due to an inadequate volume of blood received in culture bottles   Culture   Final    NO GROWTH 4 DAYS Performed at [facility], [address] [place], [place] [address]    Report Status PENDING  Incomplete  Culture, blood (Routine X 2) w Reflex to ID Panel     Status: None (Preliminary result)   Collection Time: 01/16/23  9:48 AM   Specimen: BLOOD LEFT HAND  Result Value Ref Range Status   Specimen Description BLOOD LEFT HAND  Final   Special Requests   Final    BOTTLES DRAWN AEROBIC AND ANAEROBIC Blood Culture adequate volume   Culture   Final    NO GROWTH 4 DAYS Performed at [facility], [address] [place], [place] [address]    Report Status PENDING  Incomplete  MRSA Next Gen by PCR, Nasal     Status: None   Collection Time: 01/16/23  2:03 PM   Specimen: Nasal Mucosa; Nasal Swab  Result Value Ref Range Status   MRSA by PCR Next Gen NOT DETECTED NOT DETECTED Final    Comment: (NOTE) The GeneXpert MRSA Assay (FDA approved for NASAL specimens only), is one component of a comprehensive MRSA colonization surveillance program. It is not intended to diagnose MRSA infection nor to guide or monitor treatment for MRSA infections. Test performance is not FDA  approved in patients less than 52 years old. Performed at [facility], [address] [place], [place] [address]      Radiology Studies: DG Abd Portable 1V  Result Date:  01/19/2023 CLINICAL DATA:  [id number] Encounter for feeding tube placement [id number] EXAM: PORTABLE ABDOMEN - 1 VIEW COMPARISON:  Abdominal radiograph 01/17/2023 FINDINGS: Feeding tube tip overlies the distal stomach/proximal duodenum. There is bilateral airspace disease and partially loculated right pleural effusion in the fissure, as seen on recent chest CT. Partially visualized central venous catheter tip and right great vessel stent. An IVC filter is in place. IMPRESSION: Feeding tube tip overlies the distal stomach/proximal duodenum. Electronically Signed   By: Caprice Renshaw M.D.   On: 01/19/2023 15:54   CT Angio Abd/Pel w/ and/or w/o  Result Date: 01/19/2023 CLINICAL DATA:  Lower GI bleed EXAM: CTA ABDOMEN AND PELVIS WITHOUT AND WITH CONTRAST TECHNIQUE: Multidetector CT imaging of the abdomen and pelvis was performed using the standard protocol during bolus administration of intravenous contrast. Multiplanar reconstructed images and MIPs were obtained and reviewed to evaluate the vascular anatomy. RADIATION DOSE REDUCTION: This exam was performed according to the departmental dose-optimization program which includes automated exposure control, adjustment of the mA and/or kV according to patient size and/or use of iterative reconstruction technique. CONTRAST:  OMNIPAQUE IOHEXOL 350 MG/ML SOLN COMPARISON:  01/16/2023 FINDINGS: VASCULAR Normal contour and caliber of the abdominal aorta. No evidence of aneurysm, dissection, or other acute aortic pathology. Standard branching pattern of the abdominal aorta, with solitary bilateral renal arteries. Atherosclerosis at the branch vessel origins without high-grade stenosis. Infrarenal IVC filter. Review of the MIP images confirms the above findings. NON-VASCULAR Lower chest: Small, loculated appearing bilateral pleural effusions with associated scarring or atelectasis. Cardiomegaly. Coronary artery calcifications. Hepatobiliary: No solid liver abnormality is seen.  Numerous small gallstones. No gallbladder wall thickening, or biliary dilatation. Pancreas: Unremarkable. No pancreatic ductal dilatation or surrounding inflammatory changes. Spleen: Normal in size without significant abnormality. Adrenals/Urinary Tract: Adrenal glands are unremarkable. Innumerable renal cysts of varying sizes, without obvious solid renal lesion or suspicious contrast enhancement. At least 1 intrinsically hyperdense, nonenhancing hemorrhagic or proteinaceous cyst of the lateral midportion of the right kidney (series 6, image 33). Bladder is unremarkable. Stomach/Bowel: Stomach is within normal limits. Appendix appears normal. Sigmoid diverticulosis. Some evidence of mucosal hyperenhancement involving the distal colon and rectum (series 13, image 66), without intraluminal contrast extravasation. Fluid present in the colon to the rectum. Lymphatic: Aortic atherosclerosis. No enlarged abdominal or pelvic lymph nodes. Reproductive: No mass or other significant abnormality. Other: No abdominal wall hernia or abnormality. No ascites. Musculoskeletal: No acute osseous findings. 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 IMPRESSION: 1. Some evidence of mucosal hyperenhancement involving the distal colon and rectum, without intraluminal contrast extravasation specifically localize reported GI bleeding. Fluid present in the colon to the rectum. Findings are consistent with nonspecific infectious, inflammatory, or ischemic colitis and associated diarrheal illness. 2. Sigmoid diverticulosis without specific evidence of acute diverticulitis. 3. Aortic atherosclerosis. No evidence of aortic aneurysm, dissection, or other acute aortic pathology. 4. Small, loculated appearing bilateral pleural effusions with associated scarring or atelectasis. 5. Cardiomegaly and coronary artery disease. 6. Cholelithiasis. 7. Polycystic kidney disease. Aortic Atherosclerosis (ICD10-I70.0). Electronically Signed    By: Jearld Lesch M.D.   On: 01/19/2023 14:53     Scheduled Meds:  sodium chloride   Intravenous Once   sodium chloride   Intravenous Once   sodium chloride   Intravenous  Once   atorvastatin  40 mg Per Tube Daily   calcitRIOL  3.3 mcg Oral Q M,W,F-HD   Chlorhexidine Gluconate Cloth  6 each Topical Q0600   Chlorhexidine Gluconate Cloth  6 each Topical Q0600   cinacalcet  90 mg Oral Q M,W,F-HD   [START ON 02/09/2023] darbepoetin (ARANESP) injection - DIALYSIS  150 mcg Subcutaneous Q Fri-1800   feeding supplement  237 mL Oral TID BM   midodrine  10 mg Oral Q M,W,F   multivitamin  1 tablet Oral QHS   pantoprazole (PROTONIX) IV  40 mg Intravenous Q12H   sevelamer carbonate  1,600 mg Per Tube TID WC   sodium chloride flush  3 mL Intravenous Q12H   Continuous Infusions:  sodium chloride     sodium chloride 10 mL/hr at 01/19/23 1556   albumin human     dextrose 50 mL/hr at 01/20/23 0930   piperacillin-tazobactam (ZOSYN)  IV 2.25 g (01/20/23 0500)     LOS: 6 days    Time spent:    Zannie Cove, MD [facility]   01/20/2023, 12:31 PM

## 2023-01-20 NOTE — Progress Notes (Signed)
Patient had 2 large episodes of bloody bowel stools today. Second want this evening, red, with clots. MD Jomarie Longs notified, CBC sent to check hemoglobin.

## 2023-01-20 NOTE — Progress Notes (Signed)
Lab called hemoglobin of 6.0. Darnelle Spangle, oncoming nurse notified of critical.

## 2023-01-20 NOTE — Progress Notes (Signed)
Pt removed NG & PIV. Pt had soft mittens, lines/tubes covered & disguised, positioned comfortably - disoriented & only oriented to self. Pt unable to be redirected, believes he is home, and unable to resolve situation & surroundings. Occasionally yells to call people who are not there. Pt does not follow commands, uses hands through mittens/teeth/equipment & objects around him to remove PIV & pull NG. Pt not distractible. MD updated about each event, new order for bilateral soft wrists.

## 2023-01-20 NOTE — Progress Notes (Signed)
During rest & ROM pt continually tries to reach towards IJ and lines, unable to be redirected, reoriented

## 2023-01-20 NOTE — Progress Notes (Signed)
Rounding Note    Patient Name: Travis [name] Date of Encounter: 01/20/2023  [facility] Cardiologist: Meriam Sprague, MD   Subjective   Somnolent today but arousable to voice. Able to follow simple commands. Denies chest pain.  Had continued rectal bleeding yesterday. H/H 9.2>8.2.  Palliative care team consulted with plans to meet with family today  Inpatient Medications    Scheduled Meds:  sodium chloride   Intravenous Once   sodium chloride   Intravenous Once   sodium chloride   Intravenous Once   atorvastatin  40 mg Per Tube Daily   calcitRIOL  3.3 mcg Oral Q M,W,F-HD   Chlorhexidine Gluconate Cloth  6 each Topical Q0600   cinacalcet  90 mg Oral Q M,W,F-HD   midodrine  10 mg Oral Q M,W,F   multivitamin  1 tablet Per Tube QHS   pantoprazole (PROTONIX) IV  40 mg Intravenous Q12H   sevelamer carbonate  1,600 mg Per Tube TID WC   sodium chloride flush  3 mL Intravenous Q12H   Continuous Infusions:  sodium chloride     sodium chloride 10 mL/hr at 01/19/23 1556   albumin human     dextrose 75 mL/hr at 01/19/23 2011   piperacillin-tazobactam (ZOSYN)  IV 2.25 g (01/20/23 0500)   PRN Meds: sodium chloride, sodium chloride, albumin human, phenol, sodium chloride flush   Vital Signs    Vitals:   01/20/23 0400 01/20/23 0500 01/20/23 0600 01/20/23 0700  BP: 115/71 100/66 111/68 107/72  Pulse: (!) 102 99 97 (!) 101  Resp: 20 (!) 22 (!) 27 18  Temp:   98.4 F (36.9 C)   TempSrc:   Oral   SpO2: 93% 99% 96% 98%  Weight:      Height:        Intake/Output Summary (Last 24 hours) at 01/20/2023 0981 Last data filed at 01/20/2023 0451 Gross per 24 hour  Intake 2321.68 ml  Output 473 ml  Net 1848.68 ml       01/19/2023   11:41 AM 01/19/2023    8:15 AM 01/19/2023   12:00 AM  Last 3 Weights  Weight (lbs) 137 lb 2 oz 139 lb 15.9 oz 140 lb 14 oz  Weight (kg) 62.2 kg 63.5 kg 63.9 kg      Telemetry    NSR/ST - Personally Reviewed  ECG    No new tracing-  Personally Reviewed  Physical Exam   GEN: Thin, frail male, somnolent but arousable to voice Neck: CVL in place Cardiac: RRR, 1/6 systolic murmur Respiratory: Clear on anterior exam GI: Soft, nontender, non-distended  MS: No edema; No deformity. Neuro:  Somnolent but able to follow commands Psych: Calm  Labs    High Sensitivity Troponin:   Recent Labs  Lab 01/24/2023 2052 02/08/2023 2330 01/15/23 0101 01/15/23 0835 01/16/23 0928  TROPONINIHS 1,597* 1,697* 1,709* 2,373* 2,679*      Chemistry Recent Labs  Lab 01/17/23 1841 01/18/23 0440 01/18/23 1700 01/19/23 0517 01/19/23 1331 01/20/23 0440  NA  --  131*  --  134* 138 133*  K  --  3.5  --  4.3 3.4* 3.3*  CL  --  92*  --  98 99 95*  CO2  --  23  --  22 26 25   GLUCOSE  --  128*  --  112* 192* 148*  BUN  --  43*  --  68* 36* 44*  CREATININE  --  5.14*  --  6.89* 4.23* 5.69*  CALCIUM  --  7.5*  --  6.9* 7.2* 7.2*  MG 1.9 2.0 2.2  --   --   --   PROT  --  6.8  --  5.8*  --  5.7*  ALBUMIN  --  2.5*  --  2.1*  --  2.1*  AST  --  356*  --  246*  --  125*  ALT  --  453*  --  346*  --  241*  ALKPHOS  --  92  --  81  --  72  BILITOT  --  1.3*  --  1.2  --  1.4*  GFRNONAA  --  11*  --  7* 13* 9*  ANIONGAP  --  16*  --  14 13 13      Lipids  Recent Labs  Lab 01/15/23 0835  CHOL 119  TRIG 77  HDL 43  LDLCALC 61  CHOLHDL 2.8     Hematology Recent Labs  Lab 01/19/23 0517 01/19/23 1331 01/20/23 0440  WBC 16.4* 14.9* 15.9*  RBC 2.18* 2.96* 2.63*  HGB 6.8* 9.2* 8.2*  HCT 21.9* 27.2* 25.1*  MCV 100.5* 91.9 95.4  MCH 31.2 31.1 31.2  MCHC 31.1 33.8 32.7  RDW 21.4* 19.4* 21.2*  PLT 202 150 164    Thyroid No results for input(s): "TSH", "FREET4" in the last 168 hours.  BNP Recent Labs  Lab 02/01/2023 1841  BNP >4,500.0*     DDimer No results for input(s): "DDIMER" in the last 168 hours.   Radiology    DG Abd Portable 1V  Result Date: 01/19/2023 CLINICAL DATA:  [id number] Encounter for feeding tube placement  [id number] EXAM: PORTABLE ABDOMEN - 1 VIEW COMPARISON:  Abdominal radiograph 01/17/2023 FINDINGS: Feeding tube tip overlies the distal stomach/proximal duodenum. There is bilateral airspace disease and partially loculated right pleural effusion in the fissure, as seen on recent chest CT. Partially visualized central venous catheter tip and right great vessel stent. An IVC filter is in place. IMPRESSION: Feeding tube tip overlies the distal stomach/proximal duodenum. Electronically Signed   By: Caprice Renshaw M.D.   On: 01/19/2023 15:54   CT Angio Abd/Pel w/ and/or w/o  Result Date: 01/19/2023 CLINICAL DATA:  Lower GI bleed EXAM: CTA ABDOMEN AND PELVIS WITHOUT AND WITH CONTRAST TECHNIQUE: Multidetector CT imaging of the abdomen and pelvis was performed using the standard protocol during bolus administration of intravenous contrast. Multiplanar reconstructed images and MIPs were obtained and reviewed to evaluate the vascular anatomy. RADIATION DOSE REDUCTION: This exam was performed according to the departmental dose-optimization program which includes automated exposure control, adjustment of the mA and/or kV according to patient size and/or use of iterative reconstruction technique. CONTRAST:  OMNIPAQUE IOHEXOL 350 MG/ML SOLN COMPARISON:  01/16/2023 FINDINGS: VASCULAR Normal contour and caliber of the abdominal aorta. No evidence of aneurysm, dissection, or other acute aortic pathology. Standard branching pattern of the abdominal aorta, with solitary bilateral renal arteries. Atherosclerosis at the branch vessel origins without high-grade stenosis. Infrarenal IVC filter. Review of the MIP images confirms the above findings. NON-VASCULAR Lower chest: Small, loculated appearing bilateral pleural effusions with associated scarring or atelectasis. Cardiomegaly. Coronary artery calcifications. Hepatobiliary: No solid liver abnormality is seen. Numerous small gallstones. No gallbladder wall thickening, or biliary  dilatation. Pancreas: Unremarkable. No pancreatic ductal dilatation or surrounding inflammatory changes. Spleen: Normal in size without significant abnormality. Adrenals/Urinary Tract: Adrenal glands are unremarkable. Innumerable renal cysts of varying sizes, without obvious solid renal lesion or suspicious contrast enhancement.  At least 1 intrinsically hyperdense, nonenhancing hemorrhagic or proteinaceous cyst of the lateral midportion of the right kidney (series 6, image 33). Bladder is unremarkable. Stomach/Bowel: Stomach is within normal limits. Appendix appears normal. Sigmoid diverticulosis. Some evidence of mucosal hyperenhancement involving the distal colon and rectum (series 13, image 66), without intraluminal contrast extravasation. Fluid present in the colon to the rectum. Lymphatic: Aortic atherosclerosis. No enlarged abdominal or pelvic lymph nodes. Reproductive: No mass or other significant abnormality. Other: No abdominal wall hernia or abnormality. No ascites. Musculoskeletal: No acute osseous findings. 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 IMPRESSION: 1. Some evidence of mucosal hyperenhancement involving the distal colon and rectum, without intraluminal contrast extravasation specifically localize reported GI bleeding. Fluid present in the colon to the rectum. Findings are consistent with nonspecific infectious, inflammatory, or ischemic colitis and associated diarrheal illness. 2. Sigmoid diverticulosis without specific evidence of acute diverticulitis. 3. Aortic atherosclerosis. No evidence of aortic aneurysm, dissection, or other acute aortic pathology. 4. Small, loculated appearing bilateral pleural effusions with associated scarring or atelectasis. 5. Cardiomegaly and coronary artery disease. 6. Cholelithiasis. 7. Polycystic kidney disease. Aortic Atherosclerosis (ICD10-I70.0). Electronically Signed   By: Jearld Lesch M.D.   On: 01/19/2023 14:53    Cardiac Studies    Cardiac Studies & Procedures   CARDIAC CATHETERIZATION  CARDIAC CATHETERIZATION 07/06/2022  Narrative   Heavily calcified diffusely diseased vessel: 1st Diag-1 lesion is 99% stenosed. 1st Diag-2 lesion is 60% stenosed. 1st Diag-3 lesion is 95% stenosed.   The left ventricular systolic function is normal. The left ventricular ejection fraction is 50-55% by visual estimate.   LV end diastolic pressure is moderately elevated.   Hemodynamic findings consistent with mild pulmonary hypertension.  POSTOP DIAGNOSES Moderate-severe 2-vessel single-vessel disease: Proximal D1-99%, mid 60%, distal 95% (heavily calcified/diffusely diseased; not favorable for PCI would require atherectomy and a relatively small caliber vessel) & 60% ostial sidebranch of OM1 followed by a 60% mid vessel then distal occlusion (with right to left collaterals). Otherwise mild diffuse disease/calcified vessels Mostly preserved LVEF, cannot exclude anterolateral hypokinesis. LVEDP moderately elevated at 19 mmHg (LVEDP 166/5 mmHg) with a PCWP of ~22 mmHg. RHC numbers: Mean RAP 6 mmHg, RVP-EDP 51/0-5 mmHg; PAP-mean 50/13-27 mmHg; PCWP 17-26 mmHg (~22 mmHg); Ao sat 99%, PA sat 74%. CO-CI: Fick -> 8.22, 4.44; thermal 5.37-2.7 (suspect that the discrepancy is related to AV fistula)   RECOMMENDATIONS Would recommend medical management for heavily diseased D1 branch. This is heavily calcified vessel with diffuse disease, will require atherectomy and extensive stent placement in the vessel is roughly 2 to 2.25 mm distally.  Not favorable for PCI. Consider slightly increased volume removal and HD based on elevated filling pressures. Return to nursing for ongoing care    Bryan Lemma, MD  Findings Coronary Findings Diagnostic  Dominance: Co-dominant  Left Anterior Descending Vessel is normal in caliber. There is mild diffuse disease throughout the vessel. The vessel is mildly calcified.  First Diagonal  Branch Vessel is small in size. There is moderate  diffuse disease in the vessel. Diffuse calcification 1st Diag-1 lesion is 99% stenosed. Vessel is the culprit lesion. The lesion is eccentric. The lesion is severely calcified. 1st Diag-2 lesion is 60% stenosed. The lesion is concentric. The lesion is moderately calcified. 1st Diag-3 lesion is 95% stenosed. The lesion is segmental and concentric. The lesion is moderately calcified.  Lateral First Diagonal Branch Vessel is small in size.  Second Diagonal Branch Vessel is small in size.  Left Circumflex  Vessel is normal in caliber and large. The vessel exhibits minimal luminal irregularities.  First Obtuse Marginal Branch Vessel is large in size. The vessel exhibits minimal luminal irregularities.  Lateral First Obtuse Marginal Branch Vessel is small in size.  Second Obtuse Marginal Branch Vessel is small in size.  First Left Posterolateral Branch Vessel is small in size.  Second Left Posterolateral Branch Vessel is small in size.  Third Left Posterolateral Branch Vessel is small in size.  Left Posterior Atrioventricular Artery Vessel is large in size.  Right Coronary Artery Vessel was injected. Vessel is small. There is mild diffuse disease throughout the vessel. The vessel is mildly calcified.  Acute Marginal Branch Vessel is small in size.  Right Ventricular Branch  Right Posterior Descending Artery Vessel is small in size.  First Right Posterolateral Branch Vessel is moderate in size.  Intervention  No interventions have been documented.   STRESS TESTS  MYOCARDIAL PERFUSION IMAGING 01/12/2021  ECHOCARDIOGRAM  ECHOCARDIOGRAM COMPLETE 01/15/2023  Narrative ECHOCARDIOGRAM REPORT    Patient Name:   Travis [name] Date of Exam: 01/15/2023 Medical Rec #:  [id number]       Height:       70.0 in Accession #:    [id number]      Weight:       165.0 lb Date of Birth:  [birth year]       BSA:          1.923  m Patient Age:    82 years        BP:           125/83 mmHg Patient Gender: M               HR:           109 bpm. Exam Location:  Inpatient  Procedure: 2D Echo, Color Doppler, Cardiac Doppler and Intracardiac Opacification Agent  Indications:    I50.21 Acute systolic (congestive) heart failure  History:        Patient has prior history of Echocardiogram examinations, most recent 07/06/2022. CHF, CAD; Risk Factors:Hypertension.  Sonographer:    Irving Burton Senior RDCS Referring Phys: Darlin Drop   Sonographer Comments: Apical window foreshortened due to thin body habitus. IMPRESSIONS   1. Left ventricular ejection fraction, by estimation, is 25 to 30%. The left ventricle has severely decreased function. The left ventricle demonstrates regional wall motion abnormalities (see scoring diagram/findings for description). The left ventricular internal cavity size was moderately to severely dilated. There is moderate asymmetric left ventricular hypertrophy of the infero-lateral segment. Indeterminate diastolic filling due to E-A fusion. 2. Right ventricular systolic function is low normal. The right ventricular size is mildly enlarged. There is moderately elevated pulmonary artery systolic pressure. The estimated right ventricular systolic pressure is 55.2 mmHg. 3. Left atrial size was severely dilated. 4. Right atrial size was severely dilated. 5. The mitral valve is degenerative. Moderate mitral valve regurgitation. 6. Tricuspid valve regurgitation is moderate to severe. 7. The aortic valve is tricuspid. There is moderate calcification of the aortic valve. There is moderate thickening of the aortic valve. Aortic valve regurgitation is mild to moderate. Likely moderate vs mild to moderate low flow, low gradient AS with AVA 1.1cm2, mean gradient 14, Vmax 2.33, DI 0.37. SVi low at 20.Marland Kitchen Aortic valve mean gradient measures 14.0 mmHg. Aortic valve Vmax measures 2.33 m/s. 8. The inferior vena cava is  dilated in size with <50% respiratory variability, suggesting right atrial pressure of 15 mmHg.  Comparison(s): Compared  to prior TTE on 06/2022, the LVEF has dropped from 45-50% to 25-30% with WMA as described. The MR and TR are worse in the setting of elevated filling pressures.  FINDINGS Left Ventricle: Left ventricular ejection fraction, by estimation, is 25 to 30%. The left ventricle has severely decreased function. The left ventricle demonstrates regional wall motion abnormalities. Definity contrast agent was given IV to delineate the left ventricular endocardial borders. The left ventricular internal cavity size was moderately to severely dilated. There is moderate asymmetric left ventricular hypertrophy of the infero-lateral segment. Indeterminate diastolic filling due to E-A fusion.   LV Wall Scoring: The entire anterior wall, mid and distal lateral wall, entire septum, entire inferior wall, and mid anterolateral segment are hypokinetic. The basal inferolateral segment, basal anterolateral segment, and apex are normal.  Right Ventricle: The right ventricular size is mildly enlarged. No increase in right ventricular wall thickness. Right ventricular systolic function is low normal. There is moderately elevated pulmonary artery systolic pressure. The tricuspid regurgitant velocity is 3.17 m/s, and with an assumed right atrial pressure of 15 mmHg, the estimated right ventricular systolic pressure is 55.2 mmHg.  Left Atrium: Left atrial size was severely dilated.  Right Atrium: Right atrial size was severely dilated.  Pericardium: There is no evidence of pericardial effusion.  Mitral Valve: The mitral valve is degenerative in appearance. There is mild thickening of the mitral valve leaflet(s). There is mild calcification of the mitral valve leaflet(s). Moderate mitral valve regurgitation.  Tricuspid Valve: The tricuspid valve is normal in structure. Tricuspid valve regurgitation is  moderate to severe. The flow in the hepatic veins is blunted (decreased) during ventricular systole.  Aortic Valve: The aortic valve is tricuspid. There is moderate calcification of the aortic valve. There is moderate thickening of the aortic valve. Aortic valve regurgitation is mild to moderate. Aortic regurgitation PHT measures 262 msec. Likely moderate vs mild to moderate low flow, low gradient AS with AVA 1.1cm2, mean gradient 14, Vmax 2.33, DI 0.37. SVi low at 20. Aortic valve mean gradient measures 14.0 mmHg. Aortic valve peak gradient measures 21.7 mmHg. Aortic valve area, by VTI measures 1.08 cm.  Pulmonic Valve: The pulmonic valve was grossly normal. Pulmonic valve regurgitation is mild.  Aorta: The aortic root and ascending aorta are structurally normal, with no evidence of dilitation.  Venous: A systolic blunting flow pattern is recorded from the left upper pulmonary vein and the right lower pulmonary vein. The inferior vena cava is dilated in size with less than 50% respiratory variability, suggesting right atrial pressure of 15 mmHg.  IAS/Shunts: No atrial level shunt detected by color flow Doppler.   LEFT VENTRICLE PLAX 2D LVIDd:         5.10 cm      Diastology LVIDs:         4.30 cm      LV e' medial:    7.40 cm/s LV PW:         1.40 cm      LV E/e' medial:  16.1 LV IVS:        0.80 cm      LV e' lateral:   11.00 cm/s LVOT diam:     2.00 cm      LV E/e' lateral: 10.8 LV SV:         38 LV SV Index:   20 LVOT Area:     3.14 cm  LV Volumes (MOD) LV vol d, MOD A2C: 196.0 ml LV vol d, MOD  A4C: 135.0 ml LV vol s, MOD A2C: 139.0 ml LV vol s, MOD A4C: 85.5 ml LV SV MOD A2C:     57.0 ml LV SV MOD A4C:     135.0 ml LV SV MOD BP:      59.3 ml  RIGHT VENTRICLE RV S prime:     9.57 cm/s TAPSE (M-mode): 1.6 cm  LEFT ATRIUM              Index        RIGHT ATRIUM           Index LA diam:        3.40 cm  1.77 cm/m   RA Area:     23.80 cm LA Vol (A2C):   110.0 ml 57.19 ml/m   RA Volume:   84.40 ml  43.88 ml/m LA Vol (A4C):   82.6 ml  42.94 ml/m LA Biplane Vol: 96.1 ml  49.96 ml/m AORTIC VALVE AV Area (Vmax):    1.16 cm AV Area (Vmean):   1.08 cm AV Area (VTI):     1.08 cm AV Vmax:           233.00 cm/s AV Vmean:          178.000 cm/s AV VTI:            0.352 m AV Peak Grad:      21.7 mmHg AV Mean Grad:      14.0 mmHg LVOT Vmax:         85.90 cm/s LVOT Vmean:        61.300 cm/s LVOT VTI:          0.121 m LVOT/AV VTI ratio: 0.34 AI PHT:            262 msec  AORTA Ao Root diam: 3.20 cm Ao Asc diam:  3.60 cm  MITRAL VALVE                  TRICUSPID VALVE MV Area (PHT): 4.99 cm       TR Peak grad:   40.2 mmHg MV Decel Time: 152 msec       TR Vmax:        317.00 cm/s MR Peak grad:    82.1 mmHg MR Mean grad:    55.0 mmHg    SHUNTS MR Vmax:         453.00 cm/s  Systemic VTI:  0.12 m MR Vmean:        354.0 cm/s   Systemic Diam: 2.00 cm MR PISA:         1.01 cm MR PISA Eff ROA: 9 mm MR PISA Radius:  0.40 cm MV E velocity: 119.00 cm/s  Laurance Flatten MD Electronically signed by Laurance Flatten MD Signature Date/Time: 01/15/2023/11:16:53 AM    Final              Patient Profile     82 y.o. 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, multivessel CAD, and history of DVT who presented to the ER with chest pain and SOB found to have elevated trop for which Cardiology was consulted. Course has been complicated by sepsis secondary to multifocal PNA and GIB.  Assessment & Plan    #GIB: -Continues with episodes of rectal bleeding despite being off heparin -Received 2u pRBCs yesterday; hemoglobin 9.2>8.2 -Continue to trend H/H -Management of GIB per IM team -Palliative care team has been consulted  #Sepsis: #Multifocal PNA: #Lactic  Acidosis: -Patient with lactate >9 on 01/16/23 with somnolence on exam prompting transfer to ICU -On transfer to ICU CVP 6, co-ox 78 consistent with sepsis  picture -CT chest 5/6 with concern for multifocal PNA -Now on zosyn -Management per IM  #NSTEMI: #Known Multivessel CAD: -Patient presented with worsening dyspnea and chest pain found to have trop [phone] and BNP >4500 from 366 on prior labs -Cath 06/2022 with 99% D1, 60% D2, 95% D3 with heavily calcified/diffuse disease that was managed medically  -TTE with EF 25-30%, severe septal, mid-to-apical lateral, mid-to-apical anterior and mid-to- apical inferior hypokinesis with relatively preserved LV apex systolic motion, moderate MR, moderate to severe TR, mild to mod AS, mild to moderate AR -Unfortunately, given ongoing rectal bleeding and overall poor prognosis, no plans for ischemic work-up at this time -Agree with palliative care consultation -ASA and heparin stopped given ongoing GIB -Continue lipitor 40mg  daily  #Acute on Chronic Systolic HF Exacerbation: -BNP >4500 (from 365 in 11/2022) on admission with pulmonary edema on CXR consistent with acute on chronic systolic HF exacerbation -EF with interval drop in EF from 45-50% to 25-30% with multiple WMA as detailed above -No plans for ischemic evaluation at this time given severity of illness and ongoing GIB -On volume management with HD -GDMT limited due to ESRD and hypotension requiring midodrine on HD days  #Acute on Chronic Hypoxic Respiratory Failure: -Likely multifactorial in the setting of volume overload on admission and multifocal PNA on CT chest -Will manage as above  #HTN: -Off medications given hypotension with HD  #ESRD: -Management per Nephrology  #HLD: -Continue lipitor 40mg  daily  #GOC: -Agree with palliative care consultation. Given multiorgan failure, ongoing GIB, and multifocal pneumonia, overall prognosis is poor  Cardiology will sign off. If significant clinical improvement, can consider further ischemic work-up at that time but given multiorgan dysfunction and ongoing GIB, he is too high risk for  further intervention at this time.  For questions or updates, please contact [facility] Please consult [email or web address] for contact info under        Signed, Meriam Sprague, MD  01/20/2023, 8:22 AM

## 2023-01-20 NOTE — Progress Notes (Signed)
Travis [name] 9:08 AM  Subjective: Patient without any complaints and according to the nurse he had a few clots on 1 bowel movement last night but the second 1 was just dark stool and has not had a bowel movement today and has no abdominal pain  Objective: Vital signs stable afebrile no acute distress abdomen is soft nontender BUN and creatinine a little increased LFTs decreased white count about the same hemoglobin decreased  Assessment: Lower GI bleeding questionable etiology  Plan: Please let us know if he continues to have bleeding that requires a GI workup but with palliative care being called then will be on standby to help as needed my partner Dr. Lorenso Quarry is on this weekend  [facility]  office [phone] After 5PM or if no answer call [phone]

## 2023-01-20 NOTE — Progress Notes (Signed)
[facility] Progress Note   Subjective:   Pt signed off dialysis early yesterday due to pain, had some blood in bowel movement last night. Noted patient was agitated overnight. He is sleeping and in wrist restraints today. Awakens to voice, says "hi," and goes back to sleep.   Objective Vitals:   01/20/23 0600 01/20/23 0700 01/20/23 0739 01/20/23 0800  BP: 111/68 107/72 103/68 107/64  Pulse: 97 (!) 101 96 93  Resp: (!) 27 18 20 17   Temp: 98.4 F (36.9 C)  97.8 F (36.6 C)   TempSrc: Oral  Oral   SpO2: 96% 98% 99% 100%  Weight:      Height:       Physical Exam General: Sleeping male, awakens to voice, NAD Heart: RRR, 2/6 systolic murmur Lungs: CTA anteriorly, respirations unlabored Abdomen: Soft, non-distended, +BS Extremities: No edema b/l lower extremities Dialysis Access: LUE AVF + bruit  Additional Objective Labs: Basic Metabolic Panel: Recent Labs  Lab 01/17/23 1841 01/18/23 0440 01/18/23 1700 01/19/23 0517 01/19/23 1331 01/20/23 0440  NA  --  131*  --  134* 138 133*  K  --  3.5  --  4.3 3.4* 3.3*  CL  --  92*  --  98 99 95*  CO2  --  23  --  22 26 25   GLUCOSE  --  128*  --  112* 192* 148*  BUN  --  43*  --  68* 36* 44*  CREATININE  --  5.14*  --  6.89* 4.23* 5.69*  CALCIUM  --  7.5*  --  6.9* 7.2* 7.2*  PHOS 4.0 4.3 3.8  --   --   --    Liver Function Tests: Recent Labs  Lab 01/18/23 0440 01/19/23 0517 01/20/23 0440  AST 356* 246* 125*  ALT 453* 346* 241*  ALKPHOS 92 81 72  BILITOT 1.3* 1.2 1.4*  PROT 6.8 5.8* 5.7*  ALBUMIN 2.5* 2.1* 2.1*   Recent Labs  Lab 02/08/2023 1841 01/16/23 1039  LIPASE 32 40   CBC: Recent Labs  Lab 01/20/2023 1841 01/15/23 0835 01/17/23 0423 01/18/23 0440 01/18/23 2317 01/19/23 0517 01/19/23 1331 01/20/23 0440  WBC 15.1*   < > 18.1* 14.6*  --  16.4* 14.9* 15.9*  NEUTROABS 12.2*  --   --   --   --   --   --   --   HGB 8.0*   < > 8.6* 8.7*   < > 6.8* 9.2* 8.2*  HCT 26.2*   < > 26.9* 27.8*   < >  21.9* 27.2* 25.1*  MCV 99.6   < > 96.8 99.3  --  100.5* 91.9 95.4  PLT 191   < > 196 186  --  202 150 164   < > = values in this interval not displayed.   Blood Culture    Component Value Date/Time   SDES BLOOD LEFT HAND 01/16/2023 0948   SPECREQUEST  01/16/2023 0948    BOTTLES DRAWN AEROBIC AND ANAEROBIC Blood Culture adequate volume   CULT  01/16/2023 0948    NO GROWTH 4 DAYS Performed at [facility], [address], [place], [place] [address]    REPTSTATUS PENDING 01/16/2023 [id number]    Cardiac Enzymes: No results for input(s): "CKTOTAL", "CKMB", "CKMBINDEX", "TROPONINI" in the last 168 hours. CBG: Recent Labs  Lab 01/19/23 1755 01/19/23 2036 01/20/23 0037 01/20/23 0445 01/20/23 0802  GLUCAP 136* 144* 144* 143* 112*   Iron Studies: No results  for input(s): "IRON", "TIBC", "TRANSFERRIN", "FERRITIN" in the last 72 hours. @lablastinr3 @ Studies/Results: DG Abd Portable 1V  Result Date: 01/19/2023 CLINICAL DATA:  [id number] Encounter for feeding tube placement [id number] EXAM: PORTABLE ABDOMEN - 1 VIEW COMPARISON:  Abdominal radiograph 01/17/2023 FINDINGS: Feeding tube tip overlies the distal stomach/proximal duodenum. There is bilateral airspace disease and partially loculated right pleural effusion in the fissure, as seen on recent chest CT. Partially visualized central venous catheter tip and right great vessel stent. An IVC filter is in place. IMPRESSION: Feeding tube tip overlies the distal stomach/proximal duodenum. Electronically Signed   By: Caprice Renshaw M.D.   On: 01/19/2023 15:54   CT Angio Abd/Pel w/ and/or w/o  Result Date: 01/19/2023 CLINICAL DATA:  Lower GI bleed EXAM: CTA ABDOMEN AND PELVIS WITHOUT AND WITH CONTRAST TECHNIQUE: Multidetector CT imaging of the abdomen and pelvis was performed using the standard protocol during bolus administration of intravenous contrast. Multiplanar reconstructed images and MIPs were obtained and reviewed to evaluate the vascular anatomy.  RADIATION DOSE REDUCTION: This exam was performed according to the departmental dose-optimization program which includes automated exposure control, adjustment of the mA and/or kV according to patient size and/or use of iterative reconstruction technique. CONTRAST:  OMNIPAQUE IOHEXOL 350 MG/ML SOLN COMPARISON:  01/16/2023 FINDINGS: VASCULAR Normal contour and caliber of the abdominal aorta. No evidence of aneurysm, dissection, or other acute aortic pathology. Standard branching pattern of the abdominal aorta, with solitary bilateral renal arteries. Atherosclerosis at the branch vessel origins without high-grade stenosis. Infrarenal IVC filter. Review of the MIP images confirms the above findings. NON-VASCULAR Lower chest: Small, loculated appearing bilateral pleural effusions with associated scarring or atelectasis. Cardiomegaly. Coronary artery calcifications. Hepatobiliary: No solid liver abnormality is seen. Numerous small gallstones. No gallbladder wall thickening, or biliary dilatation. Pancreas: Unremarkable. No pancreatic ductal dilatation or surrounding inflammatory changes. Spleen: Normal in size without significant abnormality. Adrenals/Urinary Tract: Adrenal glands are unremarkable. Innumerable renal cysts of varying sizes, without obvious solid renal lesion or suspicious contrast enhancement. At least 1 intrinsically hyperdense, nonenhancing hemorrhagic or proteinaceous cyst of the lateral midportion of the right kidney (series 6, image 33). Bladder is unremarkable. Stomach/Bowel: Stomach is within normal limits. Appendix appears normal. Sigmoid diverticulosis. Some evidence of mucosal hyperenhancement involving the distal colon and rectum (series 13, image 66), without intraluminal contrast extravasation. Fluid present in the colon to the rectum. Lymphatic: Aortic atherosclerosis. No enlarged abdominal or pelvic lymph nodes. Reproductive: No mass or other significant abnormality. Other: No  abdominal wall hernia or abnormality. No ascites. Musculoskeletal: No acute osseous findings. 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 IMPRESSION: 1. Some evidence of mucosal hyperenhancement involving the distal colon and rectum, without intraluminal contrast extravasation specifically localize reported GI bleeding. Fluid present in the colon to the rectum. Findings are consistent with nonspecific infectious, inflammatory, or ischemic colitis and associated diarrheal illness. 2. Sigmoid diverticulosis without specific evidence of acute diverticulitis. 3. Aortic atherosclerosis. No evidence of aortic aneurysm, dissection, or other acute aortic pathology. 4. Small, loculated appearing bilateral pleural effusions with associated scarring or atelectasis. 5. Cardiomegaly and coronary artery disease. 6. Cholelithiasis. 7. Polycystic kidney disease. Aortic Atherosclerosis (ICD10-I70.0). Electronically Signed   By: Jearld Lesch M.D.   On: 01/19/2023 14:53   Medications:  sodium chloride     sodium chloride 10 mL/hr at 01/19/23 1556   albumin human     dextrose 50 mL/hr at 01/20/23 0930   piperacillin-tazobactam (ZOSYN)  IV 2.25 g (01/20/23 0500)  sodium chloride   Intravenous Once   sodium chloride   Intravenous Once   sodium chloride   Intravenous Once   atorvastatin  40 mg Per Tube Daily   calcitRIOL  3.3 mcg Oral Q M,W,F-HD   Chlorhexidine Gluconate Cloth  6 each Topical Q0600   cinacalcet  90 mg Oral Q M,W,F-HD   midodrine  10 mg Oral Q M,W,F   multivitamin  1 tablet Per Tube QHS   pantoprazole (PROTONIX) IV  40 mg Intravenous Q12H   sevelamer carbonate  1,600 mg Per Tube TID WC   sodium chloride flush  3 mL Intravenous Q12H    OP Dialysis Orders: [facility] MWF 3.5h  400/1.5   67.4kg   2/2 bath  LFA AVG  Heparin none - last HD 5/03, post wt 66.7kg - rocaltrol 2.5 mcg po tiw - sensipar 150mg  po tiw - mircera 200 mcg IV q 2wks, last 4/24, due 5/08     Assessment/Plan: Leukocytosis-Trending down overall, on IV abx per admitting team Acute hypoxic resp failure - w/ pulm edema on CXR and also ^trops c/w possible NSTEMI. Possibly with flash pulm edema from nstemi.  Improved, continue UF with HD as tolerated  NSTEMI, cardiology consulting. Did not tolerate heparin due to GI bleed ESRD - on HD MWF.  Next HD Friday. HTN/ volume -No edema on exam this am. BP's are wnl. He is now significantly below his prior EDW with poor PO intake.  Anemia esrd - Hb dropped with heparin re-challenge, received PRBC with HD 01/19/23. Hgb 8.2 this AM. Did not get ESA yesterday, ordered aranesp for tomorrow.  MBD ckd - CCa and phos are in range. Cont renvela as binder w/ sensipar and po vdra.  HFrEF -Repeat echo 01/15/23 25-30%. Volume management with HD H/o DVT - sp IVC filter. Takes eliquis at home, IV heparin gtt off due to concern of bleeding. GI following H/o hypotension - takes midodrine pre HD on mwf.  Dispo: family is meeting with palliative care today. FTT concerning and at current time RRT not improving QOL.     Rogers Blocker, PA-C 01/20/2023, 9:53 AM  [facility] Pager: [phone]

## 2023-01-20 NOTE — Care Management (Signed)
    Durable Medical Equipment  (From admission, onward)           Start     Ordered   01/20/23 1351  For home use only DME Hospital bed  Once       Comments: With hoyer lift  Question Answer Comment  Length of Need Lifetime   Patient has (list medical condition): CHF, mobility issue recent hip surgery   The above medical condition requires: Patient requires the ability to reposition frequently   Bed type Semi-electric   Hoyer Lift Yes   Support Surface: Gel Overlay      01/20/23 1355

## 2023-01-20 NOTE — Progress Notes (Signed)
Soft wrist restraints has been discontinue, patient is calm and resting. Educated on the removal of wrist restraints. Patient offered liquids ice chips and jello. Family is at bed side.

## 2023-01-20 NOTE — Consult Note (Signed)
Consultation Note Date: 01/20/2023   Patient Name: Travis [name]  DOB: 10/09/1940  MRN: [id number]  Age / Sex: 82 y.o., male  PCP: [facility] Referring Physician: Zannie Cove, MD  Reason for Consultation:  FTT  HPI/Patient Profile: 82 y.o. 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 Admitted and treated for NSTEMI, CHF exacerbation, multifocal pnumonia, GI bleeding (heparin stopped). Had dysphagia with acute illness deconditioning- cortrak was placed and he has pulled it out twice. He was unable to complete full session of dialysis yesterday- complained of pain all over. Palliative medicine consulted for goals of care.   Primary Decision Maker PATIENT and his spouse and daughter- patient states if he were unable to make decisions he would want his spouse Seward Grater to be decision maker  Discussion: Chart reviewed including labs, progress notes, imaging from this and previous encounters.  Met at the bedside with his daughter Karoline Caldwell, spouse Seward Grater, and caregive Raeanne Gathers provides 36 hours in person care per week at home through the [place].  Mr. Parrado is a veteran and graduate from [employer]. He served in [place] and is a Purple Heart recipient. 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  He has had ongoing difficulty since his hip surgery in March. His ability to ambulate and care for himself has greatly decreased.  We discussed his chronic and acute illnesses and possible trajectories as well as how his comorbidities affect that trajectory.  End of life preferences were discussed.  His current GOC are to continue current interventions. He does at times dislike dialysis. But he notes that he does  wish to continue it to prolong his life. We discussed his pain yesterday- he noted it was all over. I recommended starting trial of low dose pain medication before dialysis to increase his tolerance and he agrees.  He has been very depressed- he notes that in the ICU he was even more depressed. He grieves his loss of independence and ability to run and walk like he used to, and to do the things he wants to do.  We discussed starting anti-depressant- family and patient would like to consider this.  Code status and advanced directives were discussed. He does not have HCPOA or Living Will.  He would not want to be sustained long term artificially. He is uncertain if he would want to be on life support in the first place if it were required to keep his heart beating.  Encouraged patient/family to consider DNR/DNI status understanding evidenced based poor outcomes in similar hospitalized patients, as the cause of the arrest is likely associated with chronic/terminal disease rather than a reversible acute cardio-pulmonary event.  Family expressed they wish for him to eventually return home. He has 36 hrs assistance through [place], but we discussed he is likely to need 24 hour care. I encouraged  them to begin seeking out supplemental care now, they agreed.      SUMMARY OF RECOMMENDATIONS -Continue full code, full scope care -Patient considering starting antidepressant -Start oxycodone 5mg  q6hr for pain and before HD to help increase tolerance   Code Status/Advance Care Planning: Full code   Prognosis:   Unable to determine  Discharge Planning: To Be Determined  Primary Diagnoses: Present on Admission:  Acute on chronic combined systolic and diastolic CHF (congestive heart failure) (HCC)  Anemia in chronic kidney disease  HTN (hypertension)  NSTEMI (non-ST elevated myocardial infarction) (HCC)  Sjogren's disease (HCC)  Coronary artery disease  Acute metabolic encephalopathy   Review of  Systems  Physical Exam  Vital Signs: BP 112/68   Pulse 100   Temp 97.8 F (36.6 C) (Oral)   Resp 18   Ht 5\' 10"  (1.778 m)   Wt 62.2 kg   SpO2 100%   BMI 19.68 kg/m  Pain Scale: PAINAD   Pain Score: 0-No pain   SpO2: SpO2: 100 % O2 Device:SpO2: 100 % O2 Flow Rate: .O2 Flow Rate (L/min): 0 L/min  IO: Intake/output summary:  Intake/Output Summary (Last 24 hours) at 01/20/2023 1234 Last data filed at 01/20/2023 0451 Gross per 24 hour  Intake 1491.68 ml  Output 3 ml  Net 1488.68 ml    LBM: Last BM Date : 01/20/23 Baseline Weight: Weight: 74.8 kg Most recent weight: Weight: 62.2 kg       Thank you for this consult. Palliative medicine will continue to follow and assist as needed.  Time Total:  90 minutes Greater than 50%  of this time was spent counseling and coordinating care related to the above assessment and plan.  Signed by: Ocie Bob, AGNP-C Palliative Medicine    Please contact Palliative Medicine Team phone at [phone] for questions and concerns.  For individual provider: See Loretha Stapler

## 2023-01-20 NOTE — Progress Notes (Addendum)
Speech Language Pathology Treatment: Dysphagia  Patient Details Name: Finnlee Kuhnle MRN: [id number] DOB: Oct 21, 1940 Today's Date: 01/20/2023 Time: 4098-1191 SLP Time Calculation (min) (ACUTE ONLY): 11 min  Assessment / Plan / Recommendation Clinical Impression  Pt seen for ongoing dysphagia management.  Pt pulled cortrak which was replaced on 5/8.  Pt again pulled that tube as well.  SLP provided oral care prior to administration of PO trials.  Pt lethargic, but able to rouse.  He followed directions despite keeping eyes closed for majority of session. No gagging noted today with oral stimulation.  Pt tolerated ice chips and teaspoons of water with no clinical s/s of aspiration.  Pt benefited from verbal cue on initial trial to swallow ice chip after mastication.  With serial straw sips of thin liquid pt exhibited good tolerance with multiple swallows on all but one trial.  There was cough x1 with pt stating the water made him cough and endorsed that water went down the wrong way.  Cough was dry sounding and pt tolerated additional trials without difficulty.  Pt exhibited good oral clearance of puree and solids with no clinical s/s of aspiration.  Pt may advance to regular texture diet with thin liquids as tolerated; however, at present will initiate full liquid diet 2/2 concern for GIB.  If there is specific concern for ongoing silent aspiration given abnormal chest imaging on 5/6, SLP will be available to schedule MBSS.    Recommend full liquid diet, advancing to regular texture as medically appropriate.     HPI HPI: Will Posadas is an 82 yo M who presented to ED 5/3 with SOB. Admitted to [facility] for management of NSTEMI, Acute on Chronic HF. Chest CT 5/6: "Increased bilateral lower lobe, left-greater-than-right consolidative opacities and irregular nodularity with new multifocal tree-in-bud and ground-glass nodules within the dependent upper lobes. Findings are suspicious for multifocal pneumonia."   Now with concern for GIB on 5/9. Pt with PMH ESRD, HFrEF, HTN, mvCAD.      SLP Plan  Continue with current plan of care      Recommendations for follow up therapy are one component of a multi-disciplinary discharge planning process, led by the attending physician.  Recommendations may be updated based on patient status, additional functional criteria and insurance authorization.    Recommendations  Diet recommendations: Regular;Thin liquid (when medically appropriate.  Will initiate full liquids at present 2/2 concern fro GIB) Medication Administration:  (No specific precautions) Supervision: Staff to assist with self feeding;Trained caregiver to feed patient Compensations: Slow rate;Small sips/bites Postural Changes and/or Swallow Maneuvers: Seated upright 90 degrees                  Oral care BID   None Dysphagia, unspecified (R13.10)     Continue with current plan of care     Kerrie Pleasure, MA, CCC-SLP Acute Rehabilitation Services Office: [phone] 01/20/2023, 10:11 AM

## 2023-01-20 NOTE — TOC Progression Note (Addendum)
Transition of Care [facility]) - Progression Note    Patient Details  Name: Travis [name] MRN: [id number] Date of Birth: 1941/08/29  [facility] CM/SW Contact  Lockie Pares, RN Phone Number: 01/20/2023, 1:00 PM  Clinical Narrative:    Received communication from Palliative care regarding patient. They met with patient and family and he desired full code and aggressive treatment. The family wondered about 24/7 care. He currently receives 36 hours a week through [place] services. Called [place] [phone] (CSW) and left a message regarding request with this callers information for all back. Palliative has given the family some information on out of pocket sitting services.  1306 received call back from CSW at the [place] The patient is already at max ( over max) hours. He was just increased to 36 hours on December 23, 2022. 1330 Spoke to daughter Ms Rhames regarding needs at home. She will be staying with patient when aide not there. She is requesting home health with [facility] previously and would like them back, Hospital bed with hoyer lift and oxygen. Discussed qualifiers for oxygen. Messaged Dr Jomarie Longs about requests for F2F  and DME needs. Order  placed for bed with hoyer. Will contact CSW from [place] for DME . 1400  Messaged with Dr Jomarie Longs and Palliative  Lora Havens. Messaged Lorenza Chick from [place] to accept for [facility]. Called CSW at [place] left message.  Expected Discharge Plan: Home w Home Health Services Barriers to Discharge: (P) Continued Medical Work up  Expected Discharge Plan and Services   Discharge Planning Services: CM Consult Post Acute Care Choice: Home Health Living arrangements for the past 2 months: Single Family Home                                       Social Determinants of Health (SDOH) Interventions SDOH Screenings   Food Insecurity: No Food Insecurity (01/14/2023)  Housing: Low Risk  (01/14/2023)  Transportation Needs: No Transportation Needs  (01/14/2023)  Utilities: Not At Risk (01/14/2023)  Tobacco Use: Low Risk  (01/19/2023)    Readmission Risk Interventions    07/05/2022   12:00 PM 12/22/2021    5:33 PM 05/26/2021   12:03 PM  Readmission Risk Prevention Plan  Transportation Screening Complete Complete Complete  PCP or Specialist Appt within 3-5 Days  Complete Complete  HRI or Home Care Consult Complete Complete Complete  Social Work Consult for Recovery Care Planning/Counseling Complete Complete Complete  Palliative Care Screening Not Applicable Not Applicable Not Applicable  Medication Review (RN Care Manager) Referral to Pharmacy Complete Complete

## 2023-01-21 ENCOUNTER — Inpatient Hospital Stay (HOSPITAL_COMMUNITY): Payer: No Typology Code available for payment source

## 2023-01-21 DIAGNOSIS — K922 Gastrointestinal hemorrhage, unspecified: Secondary | ICD-10-CM

## 2023-01-21 DIAGNOSIS — Z66 Do not resuscitate: Secondary | ICD-10-CM

## 2023-01-21 DIAGNOSIS — Z515 Encounter for palliative care: Secondary | ICD-10-CM

## 2023-01-21 DIAGNOSIS — Z7189 Other specified counseling: Secondary | ICD-10-CM | POA: Diagnosis not present

## 2023-01-21 DIAGNOSIS — N186 End stage renal disease: Secondary | ICD-10-CM | POA: Diagnosis not present

## 2023-01-21 DIAGNOSIS — I5043 Acute on chronic combined systolic (congestive) and diastolic (congestive) heart failure: Secondary | ICD-10-CM | POA: Diagnosis not present

## 2023-01-21 LAB — CBC
HCT: 21.6 % — ABNORMAL LOW (ref 39.0–52.0)
HCT: 19 % — ABNORMAL LOW (ref 39.0–52.0)
Hemoglobin: 7.2 g/dL — ABNORMAL LOW (ref 13.0–17.0)
Hemoglobin: 6.2 g/dL — CL (ref 13.0–17.0)
MCH: 31 pg (ref 26.0–34.0)
MCH: 31.6 pg (ref 26.0–34.0)
MCHC: 33.3 g/dL (ref 30.0–36.0)
MCHC: 32.6 g/dL (ref 30.0–36.0)
MCV: 93.1 fL (ref 80.0–100.0)
MCV: 96.9 fL (ref 80.0–100.0)
Platelets: 123 10*3/uL — ABNORMAL LOW (ref 150–400)
Platelets: 125 10*3/uL — ABNORMAL LOW (ref 150–400)
RBC: 2.32 MIL/uL — ABNORMAL LOW (ref 4.22–5.81)
RBC: 1.96 MIL/uL — ABNORMAL LOW (ref 4.22–5.81)
RDW: 19.8 % — ABNORMAL HIGH (ref 11.5–15.5)
RDW: 20.5 % — ABNORMAL HIGH (ref 11.5–15.5)
WBC: 15.4 10*3/uL — ABNORMAL HIGH (ref 4.0–10.5)
WBC: 15.8 10*3/uL — ABNORMAL HIGH (ref 4.0–10.5)
nRBC: 6.4 % — ABNORMAL HIGH (ref 0.0–0.2)
nRBC: 7 % — ABNORMAL HIGH (ref 0.0–0.2)

## 2023-01-21 LAB — COMPREHENSIVE METABOLIC PANEL
ALT: 145 U/L — ABNORMAL HIGH (ref 0–44)
AST: 65 U/L — ABNORMAL HIGH (ref 15–41)
Albumin: 1.7 g/dL — ABNORMAL LOW (ref 3.5–5.0)
Alkaline Phosphatase: 49 U/L (ref 38–126)
Anion gap: 13 (ref 5–15)
BUN: 58 mg/dL — ABNORMAL HIGH (ref 8–23)
CO2: 24 mmol/L (ref 22–32)
Calcium: 7.1 mg/dL — ABNORMAL LOW (ref 8.9–10.3)
Chloride: 96 mmol/L — ABNORMAL LOW (ref 98–111)
Creatinine, Ser: 6.99 mg/dL — ABNORMAL HIGH (ref 0.61–1.24)
GFR, Estimated: 7 mL/min — ABNORMAL LOW (ref >60)
Glucose, Bld: 122 mg/dL — ABNORMAL HIGH (ref 70–99)
Potassium: 3.4 mmol/L — ABNORMAL LOW (ref 3.5–5.1)
Sodium: 133 mmol/L — ABNORMAL LOW (ref 135–145)
Total Bilirubin: 1.4 mg/dL — ABNORMAL HIGH (ref 0.3–1.2)
Total Protein: 4.9 g/dL — ABNORMAL LOW (ref 6.5–8.1)

## 2023-01-21 LAB — CULTURE, BLOOD (ROUTINE X 2)
Culture: NO GROWTH
Special Requests: ADEQUATE

## 2023-01-21 LAB — GLUCOSE, CAPILLARY
Glucose-Capillary: 117 mg/dL — ABNORMAL HIGH (ref 70–99)
Glucose-Capillary: 117 mg/dL — ABNORMAL HIGH (ref 70–99)
Glucose-Capillary: 96 mg/dL (ref 70–99)

## 2023-01-21 LAB — TYPE AND SCREEN

## 2023-01-21 LAB — BPAM RBC: ISSUE DATE / TIME: 202405092136

## 2023-01-21 LAB — PREPARE RBC (CROSSMATCH)

## 2023-01-21 MED ORDER — LORAZEPAM 1 MG PO TABS: 1.0000 mg | ORAL_TABLET | ORAL | Status: DC | PRN

## 2023-01-21 MED ORDER — ANTICOAGULANT SODIUM CITRATE 4% (200MG/5ML) IV SOLN: 5.0000 mL | Status: DC | PRN

## 2023-01-21 MED ORDER — HYDROMORPHONE HCL 1 MG/ML PO LIQD: 1.0000 mg | ORAL | Status: DC | PRN

## 2023-01-21 MED ORDER — PENTAFLUOROPROP-TETRAFLUOROETH EX AERO: 1.0000 | INHALATION_SPRAY | CUTANEOUS | Status: DC | PRN

## 2023-01-21 MED ORDER — LORAZEPAM 2 MG/ML PO CONC: 1.0000 mg | ORAL | Status: DC | PRN

## 2023-01-21 MED ORDER — BIOTENE DRY MOUTH MT LIQD
15.0000 mL | Freq: Three times a day (TID) | OROMUCOSAL | Status: DC
  Administered 2023-01-21: 15 mL via TOPICAL

## 2023-01-21 MED ORDER — HYDROMORPHONE HCL 1 MG/ML IJ SOLN
0.5000 mg | INTRAMUSCULAR | Status: DC | PRN
  Administered 2023-01-21 (×2): 0.5 mg via INTRAVENOUS
  Filled 2023-01-21 (×2): qty 0.5

## 2023-01-21 MED ORDER — GLYCOPYRROLATE 0.2 MG/ML IJ SOLN: 0.2000 mg | INTRAMUSCULAR | Status: DC | PRN

## 2023-01-21 MED ORDER — LIDOCAINE HCL (PF) 1 % IJ SOLN: 5.0000 mL | INTRAMUSCULAR | Status: DC | PRN

## 2023-01-21 MED ORDER — SODIUM CHLORIDE 0.9% IV SOLUTION: Freq: Once | INTRAVENOUS | Status: DC

## 2023-01-21 MED ORDER — LIDOCAINE-PRILOCAINE 2.5-2.5 % EX CREA: 1.0000 | TOPICAL_CREAM | CUTANEOUS | Status: DC | PRN

## 2023-01-21 MED ORDER — ALTEPLASE 2 MG IJ SOLR: 2.0000 mg | Freq: Once | INTRAMUSCULAR | Status: DC | PRN

## 2023-01-21 MED ORDER — HYDROMORPHONE HCL 1 MG/ML PO LIQD: 1.0000 mg | ORAL | 0 refills | Status: DC | PRN

## 2023-01-21 MED ORDER — POLYVINYL ALCOHOL 1.4 % OP SOLN: 1.0000 [drp] | Freq: Four times a day (QID) | OPHTHALMIC | Status: DC | PRN

## 2023-01-21 MED ORDER — GLYCOPYRROLATE 1 MG PO TABS: 1.0000 mg | ORAL_TABLET | ORAL | Status: DC | PRN

## 2023-01-21 MED ORDER — LORAZEPAM 2 MG/ML PO CONC: 1.0000 mg | ORAL | 0 refills | Status: DC | PRN

## 2023-01-21 MED ORDER — HEPARIN SODIUM (PORCINE) 1000 UNIT/ML DIALYSIS: 1000.0000 [IU] | INTRAMUSCULAR | Status: DC | PRN

## 2023-01-22 LAB — TYPE AND SCREEN
ABO/RH(D): A POS
Antibody Screen: NEGATIVE
Unit division: 0
Unit division: 0

## 2023-01-22 LAB — BPAM RBC
Blood Product Expiration Date: 202406052359
Blood Product Expiration Date: 202406072359
Unit Type and Rh: 6200

## 2023-01-22 NOTE — Progress Notes (Signed)
Patient asystole on monitor. Darnelle Spangle, RN and Cammy Copa, RN at bedside. Both RNs listened for heart and lung sounds for 1 full minute. No sounds auscultated. Time of death 02-20-55. Julian Reil, DO notified. Family en route to beside.

## 2023-01-22 NOTE — Progress Notes (Signed)
Family (Daughter and wife) at bedside.

## 2023-01-22 NOTE — Progress Notes (Signed)
Family has left bedside.  All lines removed from patient. No belongings to return to family. Patient placement card given to family. Body to be sent to morgue.

## 2023-02-12 NOTE — Progress Notes (Signed)
Comfort measures initiated by this RN per orders. Telemetry notified. RN to continue to monitor pt.

## 2023-02-12 NOTE — Progress Notes (Signed)
PT Cancellation Note  Patient Details Name: Travis [name] MRN: [id number] DOB: April 26, 1941   Cancelled Treatment:    Reason Eval/Treat Not Completed: Patient at procedure or test/unavailable (Per RN, pt is going for surgery and then dialysis today. PT will follow up another time.)   [name] 02/03/2023, 11:44 AM

## 2023-02-12 NOTE — Discharge Summary (Signed)
Physician Discharge Summary  Travis [name] ZOX:[id number] DOB: 02-17-1941 DOA: 02/09/2023  PCP: [facility]  Admit date: 02/03/2023 Discharge date: 01/31/2023  Time spent: 35 minutes  Recommendations for Outpatient Follow-up:  Home with Hospice for End of life care   Discharge Diagnoses:  Principal Problem:   Acute on chronic combined systolic and diastolic CHF (congestive heart failure) (HCC) Active Problems:   Shock (HCC)   End stage renal disease on dialysis [facility])   NSTEMI (non-ST elevated myocardial infarction) (HCC)   Acute metabolic encephalopathy   Sjogren's disease (HCC)   HTN (hypertension)   Anemia in chronic kidney disease   Coronary artery disease   History of venous thromboembolism   Transaminitis   Lactic acidosis   Severe sepsis (HCC)   Encephalopathy acute   Malnutrition of moderate degree   Discharge Condition: guarded  Diet recommendation: comfort feeds  Filed Weights   01/19/23 0815 01/19/23 1141 01/24/2023 0300  Weight: 63.5 kg 62.2 kg 66.7 kg    History of present illness:  82/M with ESRD on hemodialysis, chronic systolic CHF, multivessel CAD, history of DVT and IVC filter presented to the ED 5/3 with shortness of breath, admitted for NSTEMI and acute on chronic systolic CHF -5/4 treated with dialysis -5/5 had worsening encephalopathy, leukocytosis and lactic acidosis, concern for severe sepsis, transferred to ICU, further workup noted multifocal pneumonia and pleural effusions, treated with IV Zosyn -Hospitalization complicated by dysphagia, core track placed -Also intermittent lower GI bleed in ICU -5/7 night had 2-3 episodes of lower GI bleed, hemoglobin down to 6.8, transfused 1 unit PRBC -5/8, transferred from ICU/PCCM to [facility] service, pulled out cortrak , 3 episodes of large volume hematochezia, GI consulted, stat CTA-mild colon wall thickening, cortrak replaced -5/9, pulled out core track again, GI bleeding, Palliative meeting, pt and  family wanted Full Scope of Rx -5/10: recurrent lower GI bleed, discussed code status again, now agrees to DNR  Hospital Course:     ETHICs: 82/M, chronically ill with ESRD, heart failure, multivessel CAD, encephalopathy, dysphagia, admitted with NSTEMI, CHF complicated by aspiration pneumonia and severe dysphagia, poor prognosis discussed with family by ICU team and myself,  -Patient and family met with palliative care again on 5/10, decision made for comfort focused care and discharge home with hospice  Acute on chronic combined systolic and diastolic CHF -Echo with EF 25%, low normal RV, moderate mitral regurgitation, severe TR -Poor prognosis -Now comfort care   NSTEMI Known multivessel CAD -On medical management, echo with wall motion abnormalities, troponin peaked at 1700 -Cards signed off, not a candidate for ischemic workup at this time, medical/conservative management recommended -Comfort care   Lower GI bleed Acute blood loss anemia -Intermittent episodes noted in ICU, CT abdomen 5/5 largely unremarkable, colonic diverticulosis noted -IV heparin discontinued again on 5/8 -GI consulted 5/8, seen by Dr. Ewing Schlein, stat CTA with some mucosal and enhancement in the distal colon and rectum?  Infectious inflammatory or ischemic colitis, -Treated with IV Zosyn X 4 days -Patient was sent down to radiology for bleeding scan, patient declined any further workup and wanted to go home with hospice   Severe protein calorie malnutrition -Albumin is 1.7   Severe sepsis, shock Multifocal/aspiration pneumonia Lactic acidosis -Multifocal pneumonia noted on CT -Blood cultures negative -On IV Zosyn day 4 -Dysphagia noted on swallow eval   Dysphagia -Has severe dysphagia, SLP eval completed, has pharyngeal dysphagia, alternate means of nutrition recommended -Pulled out 2 cortraks in 48 hours, SLP consulting, finally started  on p.o. trial 5/9, continue dysphagia diet   End stage renal  disease on dialysis (HCC) - On hemodialysis, nephrology following,    Acute metabolic encephalopathy -In the setting of sepsis, shock etc. -Mental status is improved, AAOx3   History of venous thromboembolism Has IVC filter in place. - Holding home apixaban - Treated with IV heparin, then held for lower GI bleed - overall prognosis is poor   Sjogren's disease (HCC) Old diagnosis.  Not currently on disease active medication   Consultants: Cards, nephrology, palliative care, GI   Discharge Exam: Vitals:   01/13/2023 0727 01/25/2023 1128  BP: 136/76 (!) 92/58  Pulse:    Resp: 15 16  Temp: (!) 97.5 F (36.4 C)   SpO2: 100% 100%   General exam: Chronically ill elderly male AAOx3 HEENT: Positive JVD CVS: S1-S2, regular rhythm, systolic murmur Lungs: Few scattered rhonchi and basilar rales Abdomen: Soft, nontender, bowel sounds present Extremities: No edema  Psych: Flat affect,    Discharge Instructions    Allergies as of 01/20/2023   No Known Allergies      Medication List     STOP taking these medications    Accu-Chek Guide test strip Generic drug: glucose blood   Accu-Chek Guide w/Device Kit   Accu-Chek Softclix Lancets lancets   albuterol 108 (90 Base) MCG/ACT inhaler Commonly known as: VENTOLIN HFA   apixaban 2.5 MG Tabs tablet Commonly known as: Eliquis   atorvastatin 40 MG tablet Commonly known as: LIPITOR   Brinzolamide-Brimonidine 1-0.2 % Susp   calcitRIOL 0.25 MCG capsule Commonly known as: ROCALTROL   carvedilol 12.5 MG tablet Commonly known as: COREG   cinacalcet 30 MG tablet Commonly known as: SENSIPAR   ferric gluconate 62.5 mg in sodium chloride 0.9 % 100 mL   gabapentin 300 MG capsule Commonly known as: NEURONTIN   latanoprost 0.005 % ophthalmic solution Commonly known as: XALATAN   midodrine 10 MG tablet Commonly known as: PROAMATINE   multivitamin Tabs tablet   nitroGLYCERIN 0.4 MG SL tablet Commonly known as:  NITROSTAT   omeprazole 40 MG capsule Commonly known as: PRILOSEC   polyethylene glycol powder 17 GM/SCOOP powder Commonly known as: MiraLax   sevelamer carbonate 800 MG tablet Commonly known as: RENVELA   timolol 0.5 % ophthalmic gel-forming Commonly known as: TIMOPTIC-XR       TAKE these medications    HYDROcodone-acetaminophen 5-325 MG tablet Commonly known as: NORCO/VICODIN Take 1 tablet by mouth every 4 (four) hours as needed for moderate pain or severe pain.   HYDROmorphone HCl 1 MG/ML Liqd Commonly known as: DILAUDID Take 1 mL (1 mg total) by mouth every 2 (two) hours as needed for severe pain or moderate pain.   LORazepam 2 MG/ML concentrated solution Commonly known as: ATIVAN Take 0.5 mLs (1 mg total) by mouth every 4 (four) hours as needed for anxiety.               Durable Medical Equipment  (From admission, onward)           Start     Ordered   01/20/23 1351  For home use only DME Hospital bed  Once       Comments: With hoyer lift  Question Answer Comment  Length of Need Lifetime   Patient has (list medical condition): CHF, mobility issue recent hip surgery   The above medical condition requires: Patient requires the ability to reposition frequently   Bed type Semi-electric   Hoyer Lift Yes  Support Surface: Gel Overlay      01/20/23 1355           No Known Allergies  Follow-up Information     [facility] Follow up.   Specialty: Home Health Services Why: PT OT social work, they will call you to set up servies Contact information: [address] [place] [address] [phone]         [facility] Follow up.   Contact information: They will call you and give you informaiton and phone numbers                 The results of significant diagnostics from this hospitalization (including imaging, microbiology, ancillary and laboratory) are listed below for reference.    Significant  Diagnostic Studies: DG Abd Portable 1V  Result Date: 01/19/2023 CLINICAL DATA:  [id number] Encounter for feeding tube placement [id number] EXAM: PORTABLE ABDOMEN - 1 VIEW COMPARISON:  Abdominal radiograph 01/17/2023 FINDINGS: Feeding tube tip overlies the distal stomach/proximal duodenum. There is bilateral airspace disease and partially loculated right pleural effusion in the fissure, as seen on recent chest CT. Partially visualized central venous catheter tip and right great vessel stent. An IVC filter is in place. IMPRESSION: Feeding tube tip overlies the distal stomach/proximal duodenum. Electronically Signed   By: Caprice Renshaw M.D.   On: 01/19/2023 15:54   CT Angio Abd/Pel w/ and/or w/o  Result Date: 01/19/2023 CLINICAL DATA:  Lower GI bleed EXAM: CTA ABDOMEN AND PELVIS WITHOUT AND WITH CONTRAST TECHNIQUE: Multidetector CT imaging of the abdomen and pelvis was performed using the standard protocol during bolus administration of intravenous contrast. Multiplanar reconstructed images and MIPs were obtained and reviewed to evaluate the vascular anatomy. RADIATION DOSE REDUCTION: This exam was performed according to the departmental dose-optimization program which includes automated exposure control, adjustment of the mA and/or kV according to patient size and/or use of iterative reconstruction technique. CONTRAST:  OMNIPAQUE IOHEXOL 350 MG/ML SOLN COMPARISON:  01/16/2023 FINDINGS: VASCULAR Normal contour and caliber of the abdominal aorta. No evidence of aneurysm, dissection, or other acute aortic pathology. Standard branching pattern of the abdominal aorta, with solitary bilateral renal arteries. Atherosclerosis at the branch vessel origins without high-grade stenosis. Infrarenal IVC filter. Review of the MIP images confirms the above findings. NON-VASCULAR Lower chest: Small, loculated appearing bilateral pleural effusions with associated scarring or atelectasis. Cardiomegaly. Coronary artery calcifications.  Hepatobiliary: No solid liver abnormality is seen. Numerous small gallstones. No gallbladder wall thickening, or biliary dilatation. Pancreas: Unremarkable. No pancreatic ductal dilatation or surrounding inflammatory changes. Spleen: Normal in size without significant abnormality. Adrenals/Urinary Tract: Adrenal glands are unremarkable. Innumerable renal cysts of varying sizes, without obvious solid renal lesion or suspicious contrast enhancement. At least 1 intrinsically hyperdense, nonenhancing hemorrhagic or proteinaceous cyst of the lateral midportion of the right kidney (series 6, image 33). Bladder is unremarkable. Stomach/Bowel: Stomach is within normal limits. Appendix appears normal. Sigmoid diverticulosis. Some evidence of mucosal hyperenhancement involving the distal colon and rectum (series 13, image 66), without intraluminal contrast extravasation. Fluid present in the colon to the rectum. Lymphatic: Aortic atherosclerosis. No enlarged abdominal or pelvic lymph nodes. Reproductive: No mass or other significant abnormality. Other: No abdominal wall hernia or abnormality. No ascites. Musculoskeletal: No acute osseous findings. 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 IMPRESSION: 1. Some evidence of mucosal hyperenhancement involving the distal colon and rectum, without intraluminal contrast extravasation specifically localize reported GI bleeding. Fluid present in the colon to the rectum.  Findings are consistent with nonspecific infectious, inflammatory, or ischemic colitis and associated diarrheal illness. 2. Sigmoid diverticulosis without specific evidence of acute diverticulitis. 3. Aortic atherosclerosis. No evidence of aortic aneurysm, dissection, or other acute aortic pathology. 4. Small, loculated appearing bilateral pleural effusions with associated scarring or atelectasis. 5. Cardiomegaly and coronary artery disease. 6. Cholelithiasis. 7. Polycystic kidney disease. Aortic  Atherosclerosis (ICD10-I70.0). Electronically Signed   By: Jearld Lesch M.D.   On: 01/19/2023 14:53   US Abdomen Limited RUQ (LIVER/GB)  Result Date: 01/17/2023 CLINICAL DATA:  Abdominal pain. EXAM: ULTRASOUND ABDOMEN LIMITED RIGHT UPPER QUADRANT COMPARISON:  CT abdomen pelvis 01/16/2023 FINDINGS: Gallbladder: There are shadowing gallstones measuring up to 1.2 cm. No gallbladder wall thickening. No sonographic Murphy sign noted by sonographer. Common bile duct: Diameter: 0.2 cm, within normal limits Liver: No focal lesion identified. Within normal limits in parenchymal echogenicity. Portal vein is patent on color Doppler imaging with normal direction of blood flow towards the liver. Other: Echogenic right kidney. IMPRESSION: 1. Cholelithiasis without sonographic evidence of acute cholecystitis. 2. Echogenic right kidney which is nonspecific but can be seen in the setting of medical renal disease. Electronically Signed   By: Emmaline Kluver M.D.   On: 01/17/2023 17:17   CT CHEST WO CONTRAST  Result Date: 01/17/2023 CLINICAL DATA:  Suspected sepsis for evaluation of pneumonia EXAM: CT CHEST WITHOUT CONTRAST TECHNIQUE: Multidetector CT imaging of the chest was performed following the standard protocol without IV contrast. RADIATION DOSE REDUCTION: This exam was performed according to the departmental dose-optimization program which includes automated exposure control, adjustment of the mA and/or kV according to patient size and/or use of iterative reconstruction technique. COMPARISON:  CT chest dated 09/19/2022 FINDINGS: Cardiovascular: Left IJ central venous catheter tip terminates in the lower SVC. Right brachiocephalic vein stent in-situ. Increased multichamber cardiomegaly. No significant pericardial fluid/thickening. Similar dilated pulmonary artery measures 3.6 cm. Extensive coronary artery calcifications. Aortic atherosclerosis. Mediastinum/Nodes: Imaged thyroid gland without nodules meeting criteria  for imaging follow-up by size. Normal esophagus. Similar multi station lymphadenopathy, for example pretracheal measures 10 mm (3:49) and left supraclavicular measures 111 mm (3:18). Lungs/Pleura: The central airways are patent. Trace layering secretions within the trachea. Similar lower lobe predominant diffuse bronchial wall thickening. Mild interlobular septal thickening is again seen. Multifocal tree-in-bud and ground-glass nodules within the dependent upper lobes are new. Increased bilateral lower lobe, left-greater-than-right consolidative opacities and irregular nodularity. A few scattered cysts are unchanged. No pneumothorax. Similar small bilateral pleural effusions. Unchanged simple fluid attenuation loculated component along the minor fissure and posterior right upper lung. Additional areas of loculated pleural effusion along the left lateral upper lung and major fissure have decreased or resolved. Upper abdomen: Enteric tube reaches the stomach terminates below the field of view. Innumerable bilateral renal cysts are better evaluated on prior CT abdomen and pelvis. Musculoskeletal: No acute or abnormal lytic or blastic osseous lesions. Similar bilateral gynecomastia. IMPRESSION: 1. Increased bilateral lower lobe, left-greater-than-right consolidative opacities and irregular nodularity with new multifocal tree-in-bud and ground-glass nodules within the dependent upper lobes. Findings are suspicious for multifocal pneumonia. 2. Similar small bilateral pleural effusions with simple fluid attenuation loculated component along the minor fissure and posterior right upper lung. Additional areas of loculated pleural effusion along the left lateral upper lung and major fissure have decreased or resolved. 3. Similar multi station lymphadenopathy, likely reactive. 4. Increased multichamber cardiomegaly. 5. Similar dilated pulmonary artery, which can be seen in the setting of pulmonary hypertension. 6. Aortic  Atherosclerosis (ICD10-I70.0).  Coronary artery calcifications. Assessment for potential risk factor modification, dietary therapy or pharmacologic therapy may be warranted, if clinically indicated. Electronically Signed   By: Agustin Cree M.D.   On: 01/17/2023 15:37   DG Abd Portable 1V  Result Date: 01/17/2023 CLINICAL DATA:  Feeding tube placement. EXAM: PORTABLE ABDOMEN - 1 VIEW COMPARISON:  KUB 07/31/2015, CT abdomen/pelvis 01/16/2023. FINDINGS: The enteric catheter tip projects over the expected location of the distal stomach. There is a nonobstructive bowel gas pattern. Enteric contrast is noted in the large bowel. An IVC filter is noted. There is no definite free intraperitoneal air, within the confines of supine technique. A left internal jugular venous catheter projects over the mid SVC. The cardiomediastinal silhouette is stable. There is a right pleural effusion with ovoid opacity projecting over the right midlung, unchanged unchanged and again likely reflecting fissural fluid. A small left pleural effusion is not significantly changed. IMPRESSION: Enteric catheter tip in the expected location of the distal stomach. Electronically Signed   By: Lesia Hausen M.D.   On: 01/17/2023 14:27   CT ABDOMEN PELVIS W CONTRAST  Result Date: 01/16/2023 CLINICAL DATA:  Sepsis EXAM: CT ABDOMEN AND PELVIS WITH CONTRAST TECHNIQUE: Multidetector CT imaging of the abdomen and pelvis was performed using the standard protocol following bolus administration of intravenous contrast. RADIATION DOSE REDUCTION: This exam was performed according to the departmental dose-optimization program which includes automated exposure control, adjustment of the mA and/or kV according to patient size and/or use of iterative reconstruction technique. CONTRAST:  75mL OMNIPAQUE IOHEXOL 350 MG/ML SOLN COMPARISON:  05/23/2021, 01/16/2023 FINDINGS: Lower chest: The heart is enlarged without pericardial effusion. There are small loculated bilateral  pleural effusions and pleural thickening. Bibasilar bronchial wall thickening. Hepatobiliary: No focal liver abnormality. Calcified gallstones are identified without gallbladder wall thickening. No biliary duct dilation. Pancreas: Unremarkable. No pancreatic ductal dilatation or surrounding inflammatory changes. Spleen: Normal in size without focal abnormality. Adrenals/Urinary Tract: Stable cystic changes in bilateral renal cortical thinning. No specific imaging follow-up is recommended. No urinary tract calculi or obstructive uropathy. Bladder is decompressed, limiting its evaluation. The adrenals are stable. Stomach/Bowel: No bowel obstruction or ileus. Normal appendix right lower quadrant. Scattered colonic diverticulosis without evidence of acute diverticulitis. No bowel wall thickening or inflammatory change. Vascular/Lymphatic: Aortic atherosclerosis. Stable IVC filter. No enlarged abdominal or pelvic lymph nodes. Reproductive: Prostate is enlarged. Evaluation limited due to streak artifact from right hip arthroplasty. Other: Trace free fluid within the upper abdomen. No free intraperitoneal gas. No abdominal wall hernia. Musculoskeletal: Right hip arthroplasty. No acute or destructive bony abnormalities. IMPRESSION: 1. Bilateral loculated pleural effusions and associated pleural thickening. 2. Marked cardiomegaly. 3. Cholelithiasis without CT evidence of acute cholecystitis. 4. Trace upper abdominal ascites. 5. Colonic diverticulosis without diverticulitis. 6. Stable enlarged prostate. 7. Stable bilateral renal cortical thinning and cystic change, consistent with chronic renal insufficiency. 8.  Aortic Atherosclerosis (ICD10-I70.0). Electronically Signed   By: Sharlet Salina M.D.   On: 01/16/2023 18:28   DG CHEST PORT 1 VIEW  Result Date: 01/16/2023 CLINICAL DATA:  Leukocytosis. EXAM: PORTABLE CHEST 1 VIEW COMPARISON:  01/16/2023 and older studies. FINDINGS: Cardiac silhouette mildly enlarged.  No  mediastinal hilar masses. Lungs hyperexpanded. Interstitial thickening is noted in the mid to lower lungs. Oval opacity in the right lower lung is consistent with loculated fluid along the right oblique fissure, present on the CT from 09/19/2022. Small pleural effusions suspected. Intact. No pneumothorax. Stable right subclavian region stent. Skeletal structures are grossly IMPRESSION:  1. No change from the most recent prior exam. 2. Interstitial thickening with small effusions suggest mild congestive heart failure. Stable loculated pleural fluid in the right oblique fissure. 3. Electronically Signed   By: Amie Portland M.D.   On: 01/16/2023 14:11   DG CHEST PORT 1 VIEW  Result Date: 01/16/2023 CLINICAL DATA:  Central line placement. EXAM: PORTABLE CHEST 1 VIEW COMPARISON:  01/16/2023 at 9:56 a.m. and older exams. FINDINGS: New left internal jugular central venous catheter has its tip in the mid superior vena cava. No pneumothorax. No other change from the exam obtained earlier today. IMPRESSION: 1. Left internal jugular central venous catheter tip projects in the mid superior vena cava. No pneumothorax. Electronically Signed   By: Amie Portland M.D.   On: 01/16/2023 14:08   ECHOCARDIOGRAM COMPLETE  Result Date: 01/15/2023    ECHOCARDIOGRAM REPORT   Patient Name:   Omkar Ebel Date of Exam: 01/15/2023 Medical Rec #:  [id number]       Height:       70.0 in Accession #:    [id number]      Weight:       165.0 lb Date of Birth:  07/16/1941       BSA:          1.923 m Patient Age:    82 years        BP:           125/83 mmHg Patient Gender: M               HR:           109 bpm. Exam Location:  Inpatient Procedure: 2D Echo, Color Doppler, Cardiac Doppler and Intracardiac            Opacification Agent Indications:    I50.21 Acute systolic (congestive) heart failure  History:        Patient has prior history of Echocardiogram examinations, most                 recent 07/06/2022. CHF, CAD; Risk Factors:Hypertension.   Sonographer:    Irving Burton Senior RDCS Referring Phys: Darlin Drop  Sonographer Comments: Apical window foreshortened due to thin body habitus. IMPRESSIONS  1. Left ventricular ejection fraction, by estimation, is 25 to 30%. The left ventricle has severely decreased function. The left ventricle demonstrates regional wall motion abnormalities (see scoring diagram/findings for description). The left ventricular internal cavity size was moderately to severely dilated. There is moderate asymmetric left ventricular hypertrophy of the infero-lateral segment. Indeterminate diastolic filling due to E-A fusion.  2. Right ventricular systolic function is low normal. The right ventricular size is mildly enlarged. There is moderately elevated pulmonary artery systolic pressure. The estimated right ventricular systolic pressure is 55.2 mmHg.  3. Left atrial size was severely dilated.  4. Right atrial size was severely dilated.  5. The mitral valve is degenerative. Moderate mitral valve regurgitation.  6. Tricuspid valve regurgitation is moderate to severe.  7. The aortic valve is tricuspid. There is moderate calcification of the aortic valve. There is moderate thickening of the aortic valve. Aortic valve regurgitation is mild to moderate. Likely moderate vs mild to moderate low flow, low gradient AS with AVA 1.1cm2, mean gradient 14, Vmax 2.33, DI 0.37. SVi low at 20.Marland Kitchen Aortic valve mean gradient measures 14.0 mmHg. Aortic valve Vmax measures 2.33 m/s.  8. The inferior vena cava is dilated in size with <50% respiratory variability, suggesting right atrial pressure of 15 mmHg.  Comparison(s): Compared to prior TTE on 06/2022, the LVEF has dropped from 45-50% to 25-30% with WMA as described. The MR and TR are worse in the setting of elevated filling pressures. FINDINGS  Left Ventricle: Left ventricular ejection fraction, by estimation, is 25 to 30%. The left ventricle has severely decreased function. The left ventricle demonstrates  regional wall motion abnormalities. Definity contrast agent was given IV to delineate the left ventricular endocardial borders. The left ventricular internal cavity size was moderately to severely dilated. There is moderate asymmetric left ventricular hypertrophy of the infero-lateral segment. Indeterminate diastolic filling due to E-A fusion.  LV Wall Scoring: The entire anterior wall, mid and distal lateral wall, entire septum, entire inferior wall, and mid anterolateral segment are hypokinetic. The basal inferolateral segment, basal anterolateral segment, and apex are normal. Right Ventricle: The right ventricular size is mildly enlarged. No increase in right ventricular wall thickness. Right ventricular systolic function is low normal. There is moderately elevated pulmonary artery systolic pressure. The tricuspid regurgitant  velocity is 3.17 m/s, and with an assumed right atrial pressure of 15 mmHg, the estimated right ventricular systolic pressure is 55.2 mmHg. Left Atrium: Left atrial size was severely dilated. Right Atrium: Right atrial size was severely dilated. Pericardium: There is no evidence of pericardial effusion. Mitral Valve: The mitral valve is degenerative in appearance. There is mild thickening of the mitral valve leaflet(s). There is mild calcification of the mitral valve leaflet(s). Moderate mitral valve regurgitation. Tricuspid Valve: The tricuspid valve is normal in structure. Tricuspid valve regurgitation is moderate to severe. The flow in the hepatic veins is blunted (decreased) during ventricular systole. Aortic Valve: The aortic valve is tricuspid. There is moderate calcification of the aortic valve. There is moderate thickening of the aortic valve. Aortic valve regurgitation is mild to moderate. Aortic regurgitation PHT measures 262 msec. Likely moderate vs mild to moderate low flow, low gradient AS with AVA 1.1cm2, mean gradient 14, Vmax 2.33, DI 0.37. SVi low at 20. Aortic valve mean  gradient measures 14.0 mmHg. Aortic valve peak gradient measures 21.7 mmHg. Aortic valve area, by VTI measures 1.08 cm. Pulmonic Valve: The pulmonic valve was grossly normal. Pulmonic valve regurgitation is mild. Aorta: The aortic root and ascending aorta are structurally normal, with no evidence of dilitation. Venous: A systolic blunting flow pattern is recorded from the left upper pulmonary vein and the right lower pulmonary vein. The inferior vena cava is dilated in size with less than 50% respiratory variability, suggesting right atrial pressure of 15 mmHg. IAS/Shunts: No atrial level shunt detected by color flow Doppler.  LEFT VENTRICLE PLAX 2D LVIDd:         5.10 cm      Diastology LVIDs:         4.30 cm      LV e' medial:    7.40 cm/s LV PW:         1.40 cm      LV E/e' medial:  16.1 LV IVS:        0.80 cm      LV e' lateral:   11.00 cm/s LVOT diam:     2.00 cm      LV E/e' lateral: 10.8 LV SV:         38 LV SV Index:   20 LVOT Area:     3.14 cm  LV Volumes (MOD) LV vol d, MOD A2C: 196.0 ml LV vol d, MOD A4C: 135.0 ml LV vol s, MOD A2C: 139.0  ml LV vol s, MOD A4C: 85.5 ml LV SV MOD A2C:     57.0 ml LV SV MOD A4C:     135.0 ml LV SV MOD BP:      59.3 ml RIGHT VENTRICLE RV S prime:     9.57 cm/s TAPSE (M-mode): 1.6 cm LEFT ATRIUM              Index        RIGHT ATRIUM           Index LA diam:        3.40 cm  1.77 cm/m   RA Area:     23.80 cm LA Vol (A2C):   110.0 ml 57.19 ml/m  RA Volume:   84.40 ml  43.88 ml/m LA Vol (A4C):   82.6 ml  42.94 ml/m LA Biplane Vol: 96.1 ml  49.96 ml/m  AORTIC VALVE AV Area (Vmax):    1.16 cm AV Area (Vmean):   1.08 cm AV Area (VTI):     1.08 cm AV Vmax:           233.00 cm/s AV Vmean:          178.000 cm/s AV VTI:            0.352 m AV Peak Grad:      21.7 mmHg AV Mean Grad:      14.0 mmHg LVOT Vmax:         85.90 cm/s LVOT Vmean:        61.300 cm/s LVOT VTI:          0.121 m LVOT/AV VTI ratio: 0.34 AI PHT:            262 msec  AORTA Ao Root diam: 3.20 cm Ao Asc diam:   3.60 cm MITRAL VALVE                  TRICUSPID VALVE MV Area (PHT): 4.99 cm       TR Peak grad:   40.2 mmHg MV Decel Time: 152 msec       TR Vmax:        317.00 cm/s MR Peak grad:    82.1 mmHg MR Mean grad:    55.0 mmHg    SHUNTS MR Vmax:         453.00 cm/s  Systemic VTI:  0.12 m MR Vmean:        354.0 cm/s   Systemic Diam: 2.00 cm MR PISA:         1.01 cm MR PISA Eff ROA: 9 mm MR PISA Radius:  0.40 cm MV E velocity: 119.00 cm/s Laurance Flatten MD Electronically signed by Laurance Flatten MD Signature Date/Time: 01/15/2023/11:16:53 AM    Final    DG Chest Portable 1 View  Result Date: 01/22/2023 CLINICAL DATA:  Shortness of breath EXAM: PORTABLE CHEST 1 VIEW COMPARISON:  CXR 12/10/22 FINDINGS: Cardiomegaly. Trace right pleural effusion. No pneumothorax. No focal airspace opacity. There are prominent bilateral interstitial opacities which could represent pulmonary venous congestion or mild pulmonary edema. Vascular stent projects over the right lung apex. No radiographically apparent displaced rib fractures. Visualized upper abdomen is unremarkable IMPRESSION: Cardiomegaly with trace right pleural effusion and mild pulmonary edema. Electronically Signed   By: Lorenza Cambridge M.D.   On: 01/13/2023 17:51    Microbiology: Recent Results (from the past 240 hour(s))  SARS Coronavirus 2 by RT PCR (hospital order, performed in [facility] hospital lab) *cepheid single result test*  Anterior Nasal Swab     Status: None   Collection Time: 02/04/2023  6:23 PM   Specimen: Anterior Nasal Swab  Result Value Ref Range Status   SARS Coronavirus 2 by RT PCR NEGATIVE NEGATIVE Final    Comment: Performed at [facility], [address] [place], [place] [address]  Culture, blood (Routine X 2) w Reflex to ID Panel     Status: None   Collection Time: 01/16/23  9:30 AM   Specimen: BLOOD LEFT HAND  Result Value Ref Range Status   Specimen Description BLOOD LEFT HAND  Final   Special Requests   Final    BOTTLES  DRAWN AEROBIC AND ANAEROBIC Blood Culture results may not be optimal due to an inadequate volume of blood received in culture bottles   Culture   Final    NO GROWTH 5 DAYS Performed at [facility], [address] [place], [place] [address]    Report Status 01/13/2023 FINAL  Final  Culture, blood (Routine X 2) w Reflex to ID Panel     Status: None   Collection Time: 01/16/23  9:48 AM   Specimen: BLOOD LEFT HAND  Result Value Ref Range Status   Specimen Description BLOOD LEFT HAND  Final   Special Requests   Final    BOTTLES DRAWN AEROBIC AND ANAEROBIC Blood Culture adequate volume   Culture   Final    NO GROWTH 5 DAYS Performed at [facility], [address], [place], [place] [address]    Report Status 01/28/2023 FINAL  Final  MRSA Next Gen by PCR, Nasal     Status: None   Collection Time: 01/16/23  2:03 PM   Specimen: Nasal Mucosa; Nasal Swab  Result Value Ref Range Status   MRSA by PCR Next Gen NOT DETECTED NOT DETECTED Final    Comment: (NOTE) The GeneXpert MRSA Assay (FDA approved for NASAL specimens only), is one component of a comprehensive MRSA colonization surveillance program. It is not intended to diagnose MRSA infection nor to guide or monitor treatment for MRSA infections. Test performance is not FDA approved in patients less than 37 years old. Performed at [facility], [address] [place], [place] [address]      Labs: Basic Metabolic Panel: Recent Labs  Lab 01/15/23 [phone] 01/16/23 0345 01/17/23 1003 01/17/23 1841 01/18/23 0440 01/18/23 1700 01/19/23 0517 01/19/23 1331 01/20/23 0440 02/03/2023 0321  NA 135   < >  --   --  131*  --  134* 138 133* 133*  K 3.8   < >  --   --  3.5  --  4.3 3.4* 3.3* 3.4*  CL 90*   < >  --   --  92*  --  98 99 95* 96*  CO2 26   < >  --   --  23  --  22 26 25 24   GLUCOSE 99   < >  --   --  128*  --  112* 192* 148* 122*  BUN 20   < >  --   --  43*  --  68* 36* 44* 58*  CREATININE 4.55*   < >  --   --   5.14*  --  6.89* 4.23* 5.69* 6.99*  CALCIUM 8.5*   < >  --   --  7.5*  --  6.9* 7.2* 7.2* 7.1*  MG 2.1  --  2.0 1.9 2.0 2.2  --   --   --   --  PHOS 3.6  --  3.3 4.0 4.3 3.8  --   --   --   --    < > = values in this interval not displayed.   Liver Function Tests: Recent Labs  Lab 01/16/23 0928 01/18/23 0440 01/19/23 0517 01/20/23 0440 02/06/2023 0321  AST 492* 356* 246* 125* 65*  ALT 403* 453* 346* 241* 145*  ALKPHOS 86 92 81 72 49  BILITOT 1.3* 1.3* 1.2 1.4* 1.4*  PROT 7.4 6.8 5.8* 5.7* 4.9*  ALBUMIN 2.8* 2.5* 2.1* 2.1* 1.7*   Recent Labs  Lab 01/31/2023 1841 01/16/23 1039  LIPASE 32 40   Recent Labs  Lab 01/16/23 1358  AMMONIA 26   CBC: Recent Labs  Lab 01/12/2023 1841 01/15/23 0835 01/19/23 1331 01/20/23 0440 01/20/23 1900 01/29/2023 0321 02/09/2023 1100  WBC 15.1*   < > 14.9* 15.9* 15.1* 15.4* 15.8*  NEUTROABS 12.2*  --   --   --   --   --   --   HGB 8.0*   < > 9.2* 8.2* 6.0* 7.2* 6.2*  HCT 26.2*   < > 27.2* 25.1* 19.1* 21.6* 19.0*  MCV 99.6   < > 91.9 95.4 98.5 93.1 96.9  PLT 191   < > 150 164 142* 123* 125*   < > = values in this interval not displayed.   Cardiac Enzymes: No results for input(s): "CKTOTAL", "CKMB", "CKMBINDEX", "TROPONINI" in the last 168 hours. BNP: BNP (last 3 results) Recent Labs    09/19/22 2136 12/05/22 2236 02/04/2023 1841  BNP 1,977.6* 365.7* >4,500.0*    ProBNP (last 3 results) No results for input(s): "PROBNP" in the last 8760 hours.  CBG: Recent Labs  Lab 01/20/23 2004 01/20/23 2350 02/02/2023 0320 01/29/2023 0733 01/20/2023 1111  GLUCAP 92 112* 117* 117* 96       Signed:  Zannie Cove MD.  [facility] 01/27/2023, 2:30 PM

## 2023-02-12 NOTE — Procedures (Signed)
HD Note Patient is refusing dialysis.  Patient nurse stated that the patient decided to choose comfort care and would not be seeking any more dialysis

## 2023-02-12 NOTE — Progress Notes (Signed)
Claxton Lyu (patient's daughter) updated that there has been a change in status to patient. She is bringing her mother to be at bedside with patient.

## 2023-02-12 NOTE — Progress Notes (Addendum)
PROGRESS NOTE    Travis [name]  ZOX:[id number] DOB: 17-Dec-1940 DOA: 01/31/2023 PCP: [facility]  82/M with ESRD on hemodialysis, chronic systolic CHF, multivessel CAD, history of DVT and IVC filter presented to the ED 5/3 with shortness of breath, admitted for NSTEMI and acute on chronic systolic CHF -5/4 treated with dialysis -5/5 had worsening encephalopathy, leukocytosis and lactic acidosis, concern for severe sepsis, transferred to ICU, further workup noted multifocal pneumonia and pleural effusions, treated with IV Zosyn -Hospitalization complicated by dysphagia, core track placed -Also intermittent lower GI bleed in ICU -5/7 night had 2-3 episodes of lower GI bleed, hemoglobin down to 6.8, transfused 1 unit PRBC -5/8, transferred from ICU/PCCM to [facility] service, pulled out cortrak , 3 episodes of large volume hematochezia, GI consulted, stat CTA-mild colon wall thickening, cortrak replaced -5/9, pulled out core track again, GI bleeding, Palliative meeting, pt and family wanted Full Scope of Rx -5/10: recurrent lower GI bleed, discussed code status again, now agrees to DNR  Subjective: -Had multiple episodes of maroon stools with clots yesterday  Assessment and Plan:  Acute on chronic combined systolic and diastolic CHF -Echo with EF 25%, low normal RV, moderate mitral regurgitation, severe TR -Poor prognosis -Volume management with HD -Continue midodrine on dialysis days  NSTEMI Known multivessel CAD -On medical management, echo with wall motion abnormalities, troponin peaked at 1700 -Cards signed off, not a candidate for ischemic workup at this time, medical/conservative management recommended -Continue Lipitor, aspirin on hold  Lower GI bleed Acute blood loss anemia -Intermittent episodes noted in ICU, CT abdomen 5/5 largely unremarkable, colonic diverticulosis noted -IV heparin discontinued again on 5/8 -GI consulted 5/8, seen by Dr. Ewing Schlein, stat CTA with some  mucosal and enhancement in the distal colon and rectum?  Infectious inflammatory or ischemic colitis, - continue IV Zosyn day 4 now -Continues to have persistent lower GI bleed, will obtain bleeding scan today, recheck CBC this afternoon, will discuss with [facility] again  Severe protein calorie malnutrition -Albumin is 1.7, diet advanced yesterday  Severe sepsis, shock Multifocal/aspiration pneumonia Lactic acidosis -Multifocal pneumonia noted on CT -Blood cultures negative -On IV Zosyn day 4 -Dysphagia noted on swallow eval  Dysphagia -Has severe dysphagia, SLP eval completed, has pharyngeal dysphagia, alternate means of nutrition recommended -Pulled out 2 cortraks in 48 hours, SLP consulting, finally started on p.o. trial 5/9, continue dysphagia diet, liquid diet for now with bleeding  End stage renal disease on dialysis (HCC) - On hemodialysis, nephrology following,   Acute metabolic encephalopathy -In the setting of sepsis, shock etc. 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  Not currently on disease active medication  ETHICs: 82/M, chronically ill with ESRD, heart failure, multivessel CAD, encephalopathy, dysphagia, admitted with NSTEMI, CHF complicated by aspiration pneumonia and severe dysphagia, poor prognosis discussed with family by ICU team and myself, had family meeting 5/9, patient and family wanted full scope of treatment. -I discussed CODE STATUS with the patient again today 5/10,  DVT prophylaxis: SCDs Code Status: DNR, see discussion above Family Communication: No family at bedside, will update spouse Disposition Plan: To be determined  Consultants: Cards, nephrology, palliative care,  GI   Procedures:   Antimicrobials:    Objective: Vitals:   02/10/2023 0100  01/22/2023 0200 01/28/2023 0300 02/09/2023 0727  BP: 115/75 117/75 109/66 136/76  Pulse: 99 (!) 112 100   Resp:   17 15  Temp:   98.5 F (36.9 C) (!) 97.5 F (36.4 C)  TempSrc:   Oral Oral  SpO2:   100% 100%  Weight:   66.7 kg   Height:        Intake/Output Summary (Last 24 hours) at 01/23/2023 1120 Last data filed at 02/01/2023 0406 Gross per 24 hour  Intake 1760.89 ml  Output 0 ml  Net 1760.89 ml   Filed Weights   01/19/23 0815 01/19/23 1141 01/15/2023 0300  Weight: 63.5 kg 62.2 kg 66.7 kg    Examination:  General exam: Chronically ill elderly male AAOx3 HEENT: Positive JVD CVS: S1-S2, regular rhythm, systolic murmur Lungs: Few scattered rhonchi and basilar rales Abdomen: Soft, nontender, bowel sounds present Extremities: No edema  Psych: Flat affect,    Data Reviewed:   CBC: Recent Labs  Lab 01/18/2023 1841 01/15/23 0835 01/19/23 0517 01/19/23 1331 01/20/23 0440 01/20/23 1900 02/06/2023 0321  WBC 15.1*   < > 16.4* 14.9* 15.9* 15.1* 15.4*  NEUTROABS 12.2*  --   --   --   --   --   --   HGB 8.0*   < > 6.8* 9.2* 8.2* 6.0* 7.2*  HCT 26.2*   < > 21.9* 27.2* 25.1* 19.1* 21.6*  MCV 99.6   < > 100.5* 91.9 95.4 98.5 93.1  PLT 191   < > 202 150 164 142* 123*   < > = values in this interval not displayed.   Basic Metabolic Panel: Recent Labs  Lab 01/15/23 0835 01/16/23 0345 01/17/23 1003 01/17/23 1841 01/18/23 0440 01/18/23 1700 01/19/23 0517 01/19/23 1331 01/20/23 0440 02/10/2023 0321  NA 135   < >  --   --  131*  --  134* 138 133* 133*  K 3.8   < >  --   --  3.5  --  4.3 3.4* 3.3* 3.4*  CL 90*   < >  --   --  92*  --  98 99 95* 96*  CO2 26   < >  --   --  23  --  22 26 25 24   GLUCOSE 99   < >  --   --  128*  --  112* 192* 148* 122*  BUN 20   < >  --   --  43*  --  68* 36* 44* 58*  CREATININE 4.55*   < >  --   --  5.14*  --  6.89* 4.23* 5.69* 6.99*  CALCIUM 8.5*   < >  --   --   7.5*  --  6.9* 7.2* 7.2* 7.1*  MG 2.1  --  2.0 1.9 2.0 2.2  --   --   --   --   PHOS 3.6  --  3.3 4.0 4.3 3.8  --   --   --   --    < > = values in this interval not displayed.   GFR: Estimated Creatinine Clearance: 7.8 mL/min (A) (by C-G formula based on SCr of 6.99 mg/dL (H)). Liver Function Tests: Recent Labs  Lab 01/16/23 0928 01/18/23 0440 01/19/23 0517 01/20/23 0440 02/08/2023 0321  AST 492* 356* 246* 125* 65*  ALT 403* 453* 346* 241* 145*  ALKPHOS 86 92 81 72 49  BILITOT 1.3* 1.3* 1.2 1.4* 1.4*  PROT 7.4 6.8 5.8* 5.7* 4.9*  ALBUMIN 2.8* 2.5* 2.1* 2.1* 1.7*   Recent Labs  Lab 01/16/2023 1841 01/16/23 1039  LIPASE 32 40   Recent Labs  Lab 01/16/23 1358  AMMONIA 26   Coagulation Profile: Recent Labs  Lab 01/16/23 0928  INR 1.9*   Cardiac Enzymes: No results for input(s): "CKTOTAL", "CKMB", "CKMBINDEX", "TROPONINI" in the last 168 hours. BNP (last 3 results) No results for input(s): "PROBNP" in the last 8760 hours. HbA1C: Recent Labs    01/18/23 2317  HGBA1C 4.7*   CBG: Recent Labs  Lab 01/20/23 2004 01/20/23 2350 02/10/2023 0320 02/02/2023 0733 01/13/2023 1111  GLUCAP 92 112* 117* 117* 96   Lipid Profile: No results for input(s): "CHOL", "HDL", "LDLCALC", "TRIG", "CHOLHDL", "LDLDIRECT" in the last 72 hours. Thyroid Function Tests: No results for input(s): "TSH", "T4TOTAL", "FREET4", "T3FREE", "THYROIDAB" in the last 72 hours. Anemia Panel: No results for input(s): "VITAMINB12", "FOLATE", "FERRITIN", "TIBC", "IRON", "RETICCTPCT" in the last 72 hours. Urine analysis:    Component Value Date/Time   COLORURINE AMBER (A) 01/29/2015 2200   APPEARANCEUR CLOUDY (A) 01/29/2015 2200   LABSPEC 1.013 01/29/2015 2200   PHURINE 5.0 01/29/2015 2200   GLUCOSEU NEGATIVE 01/29/2015 2200   HGBUR LARGE (A) 01/29/2015 2200   BILIRUBINUR NEGATIVE 01/29/2015 2200   KETONESUR NEGATIVE 01/29/2015 2200   PROTEINUR 100 (A) 01/29/2015 2200   UROBILINOGEN 0.2 01/29/2015 2200    NITRITE NEGATIVE 01/29/2015 2200   LEUKOCYTESUR NEGATIVE 01/29/2015 2200   Sepsis Labs: @LABRCNTIP (procalcitonin:4,lacticidven:4)  ) Recent Results (from the past 240 hour(s))  SARS Coronavirus 2 by RT PCR (hospital order, performed in [facility] hospital lab) *cepheid single result test* Anterior Nasal Swab     Status: None   Collection Time: 01/30/2023  6:23 PM   Specimen: Anterior Nasal Swab  Result Value Ref Range Status   SARS Coronavirus 2 by RT PCR NEGATIVE NEGATIVE Final    Comment: Performed at [facility], [address] [place], [place] [address]  Culture, blood (Routine X 2) w Reflex to ID Panel     Status: None   Collection Time: 01/16/23  9:30 AM   Specimen: BLOOD LEFT HAND  Result Value Ref Range Status   Specimen Description BLOOD LEFT HAND  Final   Special Requests   Final    BOTTLES DRAWN AEROBIC AND ANAEROBIC Blood Culture results may not be optimal due to an inadequate volume of blood received in culture bottles   Culture   Final    NO GROWTH 5 DAYS Performed at [facility], [address] [place], [place] [address]    Report Status 01/20/2023 FINAL  Final  Culture, blood (Routine X 2) w Reflex to ID Panel     Status: None   Collection Time: 01/16/23  9:48 AM   Specimen: BLOOD LEFT HAND  Result Value Ref Range Status   Specimen Description BLOOD LEFT HAND  Final   Special Requests   Final    BOTTLES DRAWN AEROBIC AND ANAEROBIC Blood Culture adequate volume   Culture   Final    NO GROWTH 5 DAYS Performed at [facility], [address], [place], [place] [address]    Report Status 02/07/2023 FINAL  Final  MRSA Next Gen by PCR, Nasal     Status: None   Collection Time: 01/16/23  2:03 PM   Specimen: Nasal Mucosa; Nasal Swab  Result Value Ref Range Status   MRSA by PCR Next Gen NOT DETECTED NOT DETECTED Final  Comment: (NOTE) The GeneXpert MRSA Assay (FDA approved for NASAL specimens only), is one component of a comprehensive MRSA  colonization surveillance program. It is not intended to diagnose MRSA infection nor to guide or monitor treatment for MRSA infections. Test performance is not FDA approved in patients less than 61 years old. Performed at [facility], [address], [place], [place] [address]      Radiology Studies: DG Abd Portable 1V  Result Date: 01/19/2023 CLINICAL DATA:  [id number] Encounter for feeding tube placement [id number] EXAM: PORTABLE ABDOMEN - 1 VIEW COMPARISON:  Abdominal radiograph 01/17/2023 FINDINGS: Feeding tube tip overlies the distal stomach/proximal duodenum. There is bilateral airspace disease and partially loculated right pleural effusion in the fissure, as seen on recent chest CT. Partially visualized central venous catheter tip and right great vessel stent. An IVC filter is in place. IMPRESSION: Feeding tube tip overlies the distal stomach/proximal duodenum. Electronically Signed   By: Caprice Renshaw M.D.   On: 01/19/2023 15:54   CT Angio Abd/Pel w/ and/or w/o  Result Date: 01/19/2023 CLINICAL DATA:  Lower GI bleed EXAM: CTA ABDOMEN AND PELVIS WITHOUT AND WITH CONTRAST TECHNIQUE: Multidetector CT imaging of the abdomen and pelvis was performed using the standard protocol during bolus administration of intravenous contrast. Multiplanar reconstructed images and MIPs were obtained and reviewed to evaluate the vascular anatomy. RADIATION DOSE REDUCTION: This exam was performed according to the departmental dose-optimization program which includes automated exposure control, adjustment of the mA and/or kV according to patient size and/or use of iterative reconstruction technique. CONTRAST:  OMNIPAQUE IOHEXOL 350 MG/ML SOLN COMPARISON:  01/16/2023 FINDINGS: VASCULAR Normal contour and caliber of the abdominal aorta. No evidence of aneurysm, dissection, or other acute aortic pathology. Standard branching pattern of the abdominal aorta, with solitary bilateral renal arteries. Atherosclerosis at the  branch vessel origins without high-grade stenosis. Infrarenal IVC filter. Review of the MIP images confirms the above findings. NON-VASCULAR Lower chest: Small, loculated appearing bilateral pleural effusions with associated scarring or atelectasis. Cardiomegaly. Coronary artery calcifications. Hepatobiliary: No solid liver abnormality is seen. Numerous small gallstones. No gallbladder wall thickening, or biliary dilatation. Pancreas: Unremarkable. No pancreatic ductal dilatation or surrounding inflammatory changes. Spleen: Normal in size without significant abnormality. Adrenals/Urinary Tract: Adrenal glands are unremarkable. Innumerable renal cysts of varying sizes, without obvious solid renal lesion or suspicious contrast enhancement. At least 1 intrinsically hyperdense, nonenhancing hemorrhagic or proteinaceous cyst of the lateral midportion of the right kidney (series 6, image 33). Bladder is unremarkable. Stomach/Bowel: Stomach is within normal limits. Appendix appears normal. Sigmoid diverticulosis. Some evidence of mucosal hyperenhancement involving the distal colon and rectum (series 13, image 66), without intraluminal contrast extravasation. Fluid present in the colon to the rectum. Lymphatic: Aortic atherosclerosis. No enlarged abdominal or pelvic lymph nodes. Reproductive: No mass or other significant abnormality. Other: No abdominal wall hernia or abnormality. No ascites. Musculoskeletal: No acute osseous findings. 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 IMPRESSION: 1. Some evidence of mucosal hyperenhancement involving the distal colon and rectum, without intraluminal contrast extravasation specifically localize reported GI bleeding. Fluid present in the colon to the rectum. Findings are consistent with nonspecific infectious, inflammatory, or ischemic colitis and associated diarrheal illness. 2. Sigmoid diverticulosis without specific evidence of acute diverticulitis. 3. Aortic  atherosclerosis. No evidence of aortic aneurysm, dissection, or other acute aortic pathology. 4. Small, loculated appearing bilateral pleural effusions with associated scarring or atelectasis. 5. Cardiomegaly and coronary artery disease. 6. Cholelithiasis. 7. Polycystic kidney disease. Aortic  Atherosclerosis (ICD10-I70.0). Electronically Signed   By: Jearld Lesch M.D.   On: 01/19/2023 14:53     Scheduled Meds:  sodium chloride   Intravenous Once   sodium chloride   Intravenous Once   sodium chloride   Intravenous Once   atorvastatin  40 mg Per Tube Daily   calcitRIOL  3.3 mcg Oral Q M,W,F-HD   Chlorhexidine Gluconate Cloth  6 each Topical Q0600   Chlorhexidine Gluconate Cloth  6 each Topical Q0600   cinacalcet  90 mg Oral Q M,W,F-HD   darbepoetin (ARANESP) injection - DIALYSIS  150 mcg Subcutaneous Q Fri-1800   feeding supplement  237 mL Oral TID BM   midodrine  10 mg Oral Q M,W,F   multivitamin  1 tablet Oral QHS   pantoprazole (PROTONIX) IV  40 mg Intravenous Q12H   sevelamer carbonate  1,600 mg Per Tube TID WC   sodium chloride flush  3 mL Intravenous Q12H   Continuous Infusions:  sodium chloride     sodium chloride 10 mL/hr at 01/19/23 1556   albumin human     anticoagulant sodium citrate     dextrose 50 mL/hr at 02/11/2023 0518   piperacillin-tazobactam (ZOSYN)  IV 2.25 g (01/30/2023 0516)     LOS: 7 days    Time spent:    Zannie Cove, MD [facility]   01/16/2023, 11:20 AM

## 2023-02-12 NOTE — Progress Notes (Addendum)
Approximately 11:45-- Pt left with transport to go to [facility]. Pt transported off monitor, [id number] running. Telemetry notified.  Approximately 13:30-- Pt returned from [facility] and is back in his room. This RN received call from [facility] and was told that pt was refusing scan and "wants to die." This RN and Consulting civil engineer, Dianna, assessed pt in [facility]. Education provided, but pt remains adamant about "going home" and "being with the Lord." [facility] scan cancelled. Pt also stating he no longer wants HD.   This RN Linward Foster MD, Tommi Emery with Nephrology, and Harvest Dark, NP. RN to await orders.

## 2023-02-12 NOTE — Progress Notes (Signed)
[facility] Progress Note   Subjective:   Seen in room today - he looks tired, but tells me very clearly that he would like dialysis one last time today, then to go home and "pull the plug." He expressed the same sentiment to his RN today. Spoke to wife who is on her way and will discuss with him when she arrives. Secure text sent to hospitalist and palliative care NP who saw him yesterday. RN reports lots of melena overnight, Hgb 7.2 this AM, being repeated now and plan is for tagged RBC scan today.  Objective Vitals:   01/31/2023 0100 02/04/2023 0200 02/09/2023 0300 01/20/2023 0727  BP: 115/75 117/75 109/66 136/76  Pulse: 99 (!) 112 100   Resp:   17 15  Temp:   98.5 F (36.9 C) (!) 97.5 F (36.4 C)  TempSrc:   Oral Oral  SpO2:   100% 100%  Weight:   66.7 kg   Height:       Physical Exam General: Frail, tired man, NAD. Nasal O2 in place Heart: RRR, 2/6 murmur Lungs: CTA anteriorly Abdomen: soft Extremities: no LE edema; 1+ RUE edema Dialysis Access: LUE AVF + bruit  Additional Objective Labs: Basic Metabolic Panel: Recent Labs  Lab 01/17/23 1841 01/18/23 0440 01/18/23 1700 01/19/23 0517 01/19/23 1331 01/20/23 0440 01/30/2023 0321  NA  --  131*  --    < > 138 133* 133*  K  --  3.5  --    < > 3.4* 3.3* 3.4*  CL  --  92*  --    < > 99 95* 96*  CO2  --  23  --    < > 26 25 24   GLUCOSE  --  128*  --    < > 192* 148* 122*  BUN  --  43*  --    < > 36* 44* 58*  CREATININE  --  5.14*  --    < > 4.23* 5.69* 6.99*  CALCIUM  --  7.5*  --    < > 7.2* 7.2* 7.1*  PHOS 4.0 4.3 3.8  --   --   --   --    < > = values in this interval not displayed.   Liver Function Tests: Recent Labs  Lab 01/19/23 0517 01/20/23 0440 02/04/2023 0321  AST 246* 125* 65*  ALT 346* 241* 145*  ALKPHOS 81 72 49  BILITOT 1.2 1.4* 1.4*  PROT 5.8* 5.7* 4.9*  ALBUMIN 2.1* 2.1* 1.7*   Recent Labs  Lab 01/18/2023 1841 01/16/23 1039  LIPASE 32 40   CBC: Recent Labs  Lab 02/05/2023 1841  01/15/23 0835 01/19/23 0517 01/19/23 1331 01/20/23 0440 01/20/23 1900 02/04/2023 0321  WBC 15.1*   < > 16.4* 14.9* 15.9* 15.1* 15.4*  NEUTROABS 12.2*  --   --   --   --   --   --   HGB 8.0*   < > 6.8* 9.2* 8.2* 6.0* 7.2*  HCT 26.2*   < > 21.9* 27.2* 25.1* 19.1* 21.6*  MCV 99.6   < > 100.5* 91.9 95.4 98.5 93.1  PLT 191   < > 202 150 164 142* 123*   < > = values in this interval not displayed.   Studies/Results: DG Abd Portable 1V  Result Date: 01/19/2023 CLINICAL DATA:  [id number] Encounter for feeding tube placement [id number] EXAM: PORTABLE ABDOMEN - 1 VIEW COMPARISON:  Abdominal radiograph 01/17/2023 FINDINGS: Feeding tube tip overlies the distal stomach/proximal duodenum. There  is bilateral airspace disease and partially loculated right pleural effusion in the fissure, as seen on recent chest CT. Partially visualized central venous catheter tip and right great vessel stent. An IVC filter is in place. IMPRESSION: Feeding tube tip overlies the distal stomach/proximal duodenum. Electronically Signed   By: Caprice Renshaw M.D.   On: 01/19/2023 15:54   CT Angio Abd/Pel w/ and/or w/o  Result Date: 01/19/2023 CLINICAL DATA:  Lower GI bleed EXAM: CTA ABDOMEN AND PELVIS WITHOUT AND WITH CONTRAST TECHNIQUE: Multidetector CT imaging of the abdomen and pelvis was performed using the standard protocol during bolus administration of intravenous contrast. Multiplanar reconstructed images and MIPs were obtained and reviewed to evaluate the vascular anatomy. RADIATION DOSE REDUCTION: This exam was performed according to the departmental dose-optimization program which includes automated exposure control, adjustment of the mA and/or kV according to patient size and/or use of iterative reconstruction technique. CONTRAST:  OMNIPAQUE IOHEXOL 350 MG/ML SOLN COMPARISON:  01/16/2023 FINDINGS: VASCULAR Normal contour and caliber of the abdominal aorta. No evidence of aneurysm, dissection, or other acute aortic pathology.  Standard branching pattern of the abdominal aorta, with solitary bilateral renal arteries. Atherosclerosis at the branch vessel origins without high-grade stenosis. Infrarenal IVC filter. Review of the MIP images confirms the above findings. NON-VASCULAR Lower chest: Small, loculated appearing bilateral pleural effusions with associated scarring or atelectasis. Cardiomegaly. Coronary artery calcifications. Hepatobiliary: No solid liver abnormality is seen. Numerous small gallstones. No gallbladder wall thickening, or biliary dilatation. Pancreas: Unremarkable. No pancreatic ductal dilatation or surrounding inflammatory changes. Spleen: Normal in size without significant abnormality. Adrenals/Urinary Tract: Adrenal glands are unremarkable. Innumerable renal cysts of varying sizes, without obvious solid renal lesion or suspicious contrast enhancement. At least 1 intrinsically hyperdense, nonenhancing hemorrhagic or proteinaceous cyst of the lateral midportion of the right kidney (series 6, image 33). Bladder is unremarkable. Stomach/Bowel: Stomach is within normal limits. Appendix appears normal. Sigmoid diverticulosis. Some evidence of mucosal hyperenhancement involving the distal colon and rectum (series 13, image 66), without intraluminal contrast extravasation. Fluid present in the colon to the rectum. Lymphatic: Aortic atherosclerosis. No enlarged abdominal or pelvic lymph nodes. Reproductive: No mass or other significant abnormality. Other: No abdominal wall hernia or abnormality. No ascites. Musculoskeletal: No acute osseous findings. 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 IMPRESSION: 1. Some evidence of mucosal hyperenhancement involving the distal colon and rectum, without intraluminal contrast extravasation specifically localize reported GI bleeding. Fluid present in the colon to the rectum. Findings are consistent with nonspecific infectious, inflammatory, or ischemic colitis and  associated diarrheal illness. 2. Sigmoid diverticulosis without specific evidence of acute diverticulitis. 3. Aortic atherosclerosis. No evidence of aortic aneurysm, dissection, or other acute aortic pathology. 4. Small, loculated appearing bilateral pleural effusions with associated scarring or atelectasis. 5. Cardiomegaly and coronary artery disease. 6. Cholelithiasis. 7. Polycystic kidney disease. Aortic Atherosclerosis (ICD10-I70.0). Electronically Signed   By: Jearld Lesch M.D.   On: 01/19/2023 14:53    Medications:  sodium chloride     sodium chloride 10 mL/hr at 01/19/23 1556   albumin human     anticoagulant sodium citrate     dextrose 50 mL/hr at 02/07/2023 0518   piperacillin-tazobactam (ZOSYN)  IV 2.25 g (01/25/2023 0516)    sodium chloride   Intravenous Once   sodium chloride   Intravenous Once   sodium chloride   Intravenous Once   atorvastatin  40 mg Per Tube Daily   calcitRIOL  3.3 mcg Oral Q M,W,F-HD   Chlorhexidine Gluconate  Cloth  6 each Topical Q0600   Chlorhexidine Gluconate Cloth  6 each Topical Q0600   cinacalcet  90 mg Oral Q M,W,F-HD   darbepoetin (ARANESP) injection - DIALYSIS  150 mcg Subcutaneous Q Fri-1800   feeding supplement  237 mL Oral TID BM   midodrine  10 mg Oral Q M,W,F   multivitamin  1 tablet Oral QHS   pantoprazole (PROTONIX) IV  40 mg Intravenous Q12H   sevelamer carbonate  1,600 mg Per Tube TID WC   sodium chloride flush  3 mL Intravenous Q12H    Dialysis Orders: [facility] MWF 3.5h  400/1.5   67.4kg   2/2 bath  LFA AVG  Heparin none - last HD 5/03, post wt 66.7kg - rocaltrol 2.5 mcg po tiw - sensipar 150mg  po tiw - mircera 200 mcg IV q 2wks, last 4/24, due 5/08     Assessment/Plan: Leukocytosis: Persistent, on IV zosyn. Acute hypoxic resp failure: Pulm edema and ^ trops, possibly NSTEMI on admit. Improved resp status with HD.  NSTEMI: Developed GIB in setting of heparin Recurretn LGIB: Heavy maroon stools overnight and this AM per RN - for  bleeding scan today per notes. ESRD: Usual MWF schedule - due for HD today. See GOC below. HypoTN/volume: No LE edema, BP holding. Losing weight. Uses mido pre-HD Anemia of ESRD + ABLA: Hgb dropped with heparin re-challenge, received 2U PRBC with HD 01/19/23, 1U more 5/9. Now with recurrent LGIB. Getting Aranesp q Friday (today). Secondary HPTH: CorrCa/Phos ok. Cont renvela as binder w/ sensipar and po vdra.  HFrEF: Repeat echo 01/15/23 25-30%. Volume management with HD Hx DVT s/p IVC filter. Home eliquis on hold due to GIB GOC: S/p family meeting yesterday - full scope decided, then changed to DNR. Today - patient is reporting that he wants to stop dialysis permanently. Informed wife of what he is saying - she is on the way and will discuss with him when she arrives.   Ozzie Hoyle, PA-C 01/23/2023, 11:31 AM  [employer]

## 2023-02-12 NOTE — Progress Notes (Signed)
Daily Progress Note   Patient Name: Travis [name]       Date: 02/06/2023 DOB: [birth year]  Age: 82 y.o. MRN#: [id number] Attending Physician: Zannie Cove, MD Primary Care Physician: [facility] Admit Date: 01/25/2023  Reason for Consultation/Follow-up: Establishing goals of care  Subjective: Pain all over, feels miserable  Length of Stay: 7  Current Medications: Scheduled Meds:   sodium chloride   Intravenous Once   antiseptic oral rinse  15 mL Topical TID   feeding supplement  237 mL Oral TID BM    Continuous Infusions:   PRN Meds: alteplase, glycopyrrolate **OR** glycopyrrolate **OR** glycopyrrolate, HYDROmorphone (DILAUDID) injection, LORazepam **OR** LORazepam, oxyCODONE-acetaminophen, pentafluoroprop-tetrafluoroeth, phenol, polyvinyl alcohol  Physical Exam Constitutional:      General: He is in acute distress.     Appearance: He is ill-appearing.  Pulmonary:     Effort: Pulmonary effort is normal.  Skin:    General: Skin is warm and dry.  Neurological:     Mental Status: He is alert and oriented to person, place, and time.             Vital Signs: BP (!) 92/58 (BP Location: Left Arm)   Pulse 100   Temp (!) 97.5 F (36.4 C) (Oral)   Resp 16   Ht 5\' 10"  (1.778 m)   Wt 66.7 kg   SpO2 100%   BMI 21.10 kg/m  SpO2: SpO2: 100 % O2 Device: O2 Device: Nasal Cannula O2 Flow Rate: O2 Flow Rate (L/min): 0.5 L/min  Intake/output summary:  Intake/Output Summary (Last 24 hours) at 01/18/2023 1403 Last data filed at 01/25/2023 0406 Gross per 24 hour  Intake 1760.89 ml  Output 0 ml  Net 1760.89 ml   LBM: Last BM Date : 01/12/2023 Baseline Weight: Weight: 74.8 kg Most recent weight: Weight: 66.7 kg       Palliative Assessment/Data: PPS 40%      Patient Active  Problem List   Diagnosis Date Noted   Malnutrition of moderate degree 01/19/2023   Shock (HCC) 01/16/2023   Transaminitis 01/16/2023   Lactic acidosis 01/16/2023   Severe sepsis (HCC) 01/16/2023   Encephalopathy acute 01/16/2023   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   Closed right hip fracture, initial encounter (HCC) 12/05/2022   Glaucoma 09/19/2022   Fluid overload 09/19/2022   Influenza A 08/03/2022   Hyperkalemia 08/02/2022   CHF (congestive heart failure) (HCC) 08/02/2022   Coronary artery disease 07/06/2022   Elevated troponin 07/05/2022   Prolonged QT interval 07/05/2022   GERD (gastroesophageal reflux disease) 07/05/2022   NSTEMI (non-ST elevated myocardial infarction) (HCC) 07/04/2022   Delirium 12/22/2021   Acute metabolic encephalopathy 12/20/2021   End stage renal disease on dialysis [facility])    Hypertensive emergency 12/18/2021   Hypervolemia associated with renal insufficiency 04/04/2019   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   Chronic combined systolic (congestive) and diastolic (congestive) heart failure (HCC) 11/05/2015   Chest pain    Thrombocytopenia (HCC) 08/11/2015   FTT (failure to thrive) in adult 08/11/2015   HTN (hypertension) 04/19/2015   Acute on chronic combined systolic and diastolic CHF (congestive heart failure) (HCC) 04/18/2015   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   ESRD on  dialysis [facility])    Palliative care encounter    Acute DVT of right tibial vein (HCC) 01/29/2015   Physical deconditioning 01/28/2015   Sjogren's disease (HCC) 01/28/2015   Normocytic anemia 12/17/2014    Palliative Care Assessment & Plan   HPI: 82 y.o. 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 Admitted and treated for NSTEMI, CHF exacerbation, multifocal pnumonia, GI bleeding (heparin stopped). Had dysphagia with acute  illness deconditioning- cortrak was placed and he has pulled it out twice. He was unable to complete full session of dialysis yesterday- complained of pain all over. Palliative medicine consulted for goals of care.   Assessment: Called to bedside by nursing as patient expressing he did not want to continue medical care, ready to die.  Went to bedside and patient expresses same to me. We discuss transition to comfort measures only and freeing him from aggressive medical care. He confirms this is what he wants. He confirms he does not want to continue HD. He does not want any further work up or treatment of his GIB. He has shared this with his family. We discuss going home with hospice support and he agrees. He complains of anxiety and pain all over - discussed symptom management plan.  Called wife and daughter and later spoke to grandson at bedside. Reviewed with all of them patient's stated wishes. They all agree to comfort measures only and getting patient home with hospice. We discuss that he will likely continue to bleed. They express understanding.   Discussed with RN, Dr. Jomarie Longs, and [facility] team at length. Team all working towards goal of getting him home with hospice.   Recommendations/Plan: Comfort measures only Dilaudid dose now Dilaudid and ativan PRN Home with hospice support ASAP Signed DNR placed on chart DC HD, dc any medical interventions not needed to promote comfort  Goals of Care and Additional Recommendations: Limitations on Scope of Treatment: Full Comfort Care  Code Status: DNR  Prognosis:  < 2 weeks  Discharge Planning: Home with Hospice  Care plan was discussed with patient, wife, daughter, grandson, Dr. Jomarie Longs, RN, charge RN, [facility] team  Thank you for allowing the Palliative Medicine Team to assist in the care of this patient.  *Please note that this is a verbal dictation therefore any spelling or grammatical errors are due to the "[employer] One" system  interpretation.  Gerlean Ren, DNP, [facility] Palliative Medicine Team Team Phone # [phone]  Pager [phone]

## 2023-02-12 NOTE — Plan of Care (Signed)
  Problem: Safety: Goal: Ability to remain free from injury will improve Outcome: Progressing   Problem: Skin Integrity: Goal: Risk for impaired skin integrity will decrease Outcome: Progressing   Problem: Education: Goal: Knowledge of General Education information will improve Description: Including pain rating scale, medication(s)/side effects and non-pharmacologic comfort measures Outcome: Not Progressing   Problem: Health Behavior/Discharge Planning: Goal: Ability to manage health-related needs will improve Outcome: Not Progressing   Problem: Clinical Measurements: Goal: Ability to maintain clinical measurements within normal limits will improve Outcome: Not Progressing Goal: Will remain free from infection Outcome: Not Progressing   Problem: Activity: Goal: Risk for activity intolerance will decrease Outcome: Not Progressing   Problem: Nutrition: Goal: Adequate nutrition will be maintained Outcome: Not Progressing   Problem: Activity: Goal: Ability to return to baseline activity level will improve Outcome: Not Progressing

## 2023-02-12 NOTE — TOC Progression Note (Addendum)
Transition of Care [facility]) - Progression Note    Patient Details  Name: Travis [name] MRN: [id number] Date of Birth: 1941/01/26  Transition of Care [facility]) CM/SW Contact  Lockie Pares, RN Phone Number: 02/10/2023, 1:53 PM  Clinical Narrative:    Travis [name] to Travis [name] regarding DME approval. Emailed orders as requested to [email or web address]. [email or web address] Palliative care messaged to state the patient  and wife both are in agreement to go home ASAP with hospice. Called and LM with Travis [name] at [facility] for acceptance.  Rescinded order for DME, it will come from Hospice. ( Email sent) the grandson Travis [name] asked to be the contact to arrange equipment for home [phone] , this was passed on to Travis [name] of [employer]   Expected Discharge Plan: Home w Home Health Services Barriers to Discharge: (P) Continued Medical Work up  Expected Discharge Plan and Services   Discharge Planning Services: CM Consult Post Acute Care Choice: Home Health Living arrangements for the past 2 months: Single Family Home                                       Social Determinants of Health (SDOH) Interventions SDOH Screenings   Food Insecurity: No Food Insecurity (01/26/2023)  Housing: Low Risk  (02/07/2023)  Transportation Needs: No Transportation Needs (01/12/2023)  Utilities: Not At Risk (01/13/2023)  Tobacco Use: Low Risk  (01/19/2023)    Readmission Risk Interventions    07/05/2022   12:00 PM 12/22/2021    5:33 PM 05/26/2021   12:03 PM  Readmission Risk Prevention Plan  Transportation Screening Complete Complete Complete  PCP or Specialist Appt within 3-5 Days  Complete Complete  HRI or Home Care Consult Complete Complete Complete  Social Work Consult for Recovery Care Planning/Counseling Complete Complete Complete  Palliative Care Screening Not Applicable Not Applicable Not Applicable  Medication Review Oceanographer) Referral to Pharmacy Complete Complete

## 2023-02-12 NOTE — Progress Notes (Signed)
Physical Therapy Discharge Patient Details Name: Ignac Eckstein MRN: [id number] DOB: 04-22-1941 Today's Date: 01/23/2023 Time:  -     Patient discharged from PT services secondary to  pt and family have elected comfort care. PT orders have been discontinued. .  Please see latest therapy progress note for current level of functioning and progress toward goals.    Progress and discharge plan discussed with patient and/or caregiver: Patient unable to participate in discharge planning and no caregivers available  GP     [name] 01/15/2023, 2:01 PM

## 2023-02-12 DEATH — deceased

## 2023-03-01 ENCOUNTER — Ambulatory Visit: Payer: Medicare Other | Admitting: Nurse Practitioner

## 2023-04-19 ENCOUNTER — Ambulatory Visit: Payer: Non-veteran care | Admitting: Podiatry

## 2023-07-20 ENCOUNTER — Ambulatory Visit: Payer: Non-veteran care | Admitting: Podiatry
